# Patient Record
Sex: Male | Born: 1940 | Race: White | Hispanic: No | Marital: Married | Smoking: Never smoker
Health system: Southern US, Community
[De-identification: ages and names within clinical notes are randomized; demographics above are authoritative.]

## PROBLEM LIST (undated history)

## (undated) DIAGNOSIS — C61 Malignant neoplasm of prostate: Secondary | ICD-10-CM

## (undated) DIAGNOSIS — I809 Phlebitis and thrombophlebitis of unspecified site: Secondary | ICD-10-CM

## (undated) DIAGNOSIS — I82409 Acute embolism and thrombosis of unspecified deep veins of unspecified lower extremity: Secondary | ICD-10-CM

## (undated) DIAGNOSIS — N183 Chronic kidney disease, stage 3 unspecified: Secondary | ICD-10-CM

## (undated) DIAGNOSIS — Z9289 Personal history of other medical treatment: Secondary | ICD-10-CM

## (undated) DIAGNOSIS — R410 Disorientation, unspecified: Secondary | ICD-10-CM

## (undated) DIAGNOSIS — G4730 Sleep apnea, unspecified: Secondary | ICD-10-CM

## (undated) DIAGNOSIS — I10 Essential (primary) hypertension: Secondary | ICD-10-CM

## (undated) DIAGNOSIS — I499 Cardiac arrhythmia, unspecified: Secondary | ICD-10-CM

## (undated) DIAGNOSIS — E669 Obesity, unspecified: Secondary | ICD-10-CM

## (undated) DIAGNOSIS — E785 Hyperlipidemia, unspecified: Secondary | ICD-10-CM

## (undated) DIAGNOSIS — I2699 Other pulmonary embolism without acute cor pulmonale: Secondary | ICD-10-CM

## (undated) DIAGNOSIS — Z8719 Personal history of other diseases of the digestive system: Secondary | ICD-10-CM

## (undated) DIAGNOSIS — E119 Type 2 diabetes mellitus without complications: Secondary | ICD-10-CM

## (undated) HISTORY — DX: Obesity, unspecified: E66.9

## (undated) HISTORY — DX: Malignant neoplasm of prostate: C61

## (undated) HISTORY — DX: Personal history of other medical treatment: Z92.89

## (undated) HISTORY — DX: Phlebitis and thrombophlebitis of unspecified site: I80.9

## (undated) HISTORY — DX: Sleep apnea, unspecified: G47.30

## (undated) HISTORY — DX: Type 2 diabetes mellitus without complications: E11.9

## (undated) HISTORY — PX: OTHER SURGICAL HISTORY: SHX169

## (undated) HISTORY — DX: Other pulmonary embolism without acute cor pulmonale: I26.99

## (undated) HISTORY — DX: Acute embolism and thrombosis of unspecified deep veins of unspecified lower extremity: I82.409

---

## 1997-02-05 HISTORY — PX: NEPHRECTOMY: SHX65

## 1997-02-05 HISTORY — PX: CHOLECYSTECTOMY: SHX55

## 1997-10-06 ENCOUNTER — Inpatient Hospital Stay (HOSPITAL_COMMUNITY): Admission: RE | Admit: 1997-10-06 | Discharge: 1997-10-11 | Payer: Self-pay | Admitting: Urology

## 1997-11-08 ENCOUNTER — Ambulatory Visit (HOSPITAL_COMMUNITY): Admission: RE | Admit: 1997-11-08 | Discharge: 1997-11-08 | Payer: Self-pay | Admitting: Gastroenterology

## 1997-12-03 ENCOUNTER — Inpatient Hospital Stay (HOSPITAL_COMMUNITY): Admission: EM | Admit: 1997-12-03 | Discharge: 1997-12-07 | Payer: Self-pay | Admitting: *Deleted

## 1999-08-22 ENCOUNTER — Encounter: Admission: RE | Admit: 1999-08-22 | Discharge: 1999-08-22 | Payer: Self-pay | Admitting: Urology

## 1999-08-22 ENCOUNTER — Encounter: Payer: Self-pay | Admitting: Urology

## 1999-10-06 ENCOUNTER — Encounter (INDEPENDENT_AMBULATORY_CARE_PROVIDER_SITE_OTHER): Payer: Self-pay

## 1999-10-06 ENCOUNTER — Other Ambulatory Visit: Admission: RE | Admit: 1999-10-06 | Discharge: 1999-10-06 | Payer: Self-pay | Admitting: Urology

## 2000-04-10 ENCOUNTER — Encounter: Payer: Self-pay | Admitting: Urology

## 2000-04-10 ENCOUNTER — Encounter: Admission: RE | Admit: 2000-04-10 | Discharge: 2000-04-10 | Payer: Self-pay | Admitting: Urology

## 2000-08-26 ENCOUNTER — Encounter: Payer: Self-pay | Admitting: Urology

## 2000-08-26 ENCOUNTER — Encounter: Admission: RE | Admit: 2000-08-26 | Discharge: 2000-08-26 | Payer: Self-pay | Admitting: Urology

## 2001-09-17 ENCOUNTER — Inpatient Hospital Stay (HOSPITAL_COMMUNITY): Admission: AD | Admit: 2001-09-17 | Discharge: 2001-09-22 | Payer: Self-pay | Admitting: Family Medicine

## 2001-10-13 ENCOUNTER — Ambulatory Visit (HOSPITAL_COMMUNITY): Admission: RE | Admit: 2001-10-13 | Discharge: 2001-10-13 | Payer: Self-pay | Admitting: Surgery

## 2001-10-13 ENCOUNTER — Encounter: Payer: Self-pay | Admitting: Surgery

## 2001-10-31 ENCOUNTER — Inpatient Hospital Stay (HOSPITAL_COMMUNITY): Admission: AD | Admit: 2001-10-31 | Discharge: 2001-11-06 | Payer: Self-pay

## 2001-11-05 ENCOUNTER — Encounter: Payer: Self-pay | Admitting: Surgery

## 2001-11-24 ENCOUNTER — Encounter: Admission: RE | Admit: 2001-11-24 | Discharge: 2001-11-24 | Payer: Self-pay | Admitting: Urology

## 2001-11-24 ENCOUNTER — Encounter: Payer: Self-pay | Admitting: Urology

## 2002-11-12 ENCOUNTER — Encounter: Admission: RE | Admit: 2002-11-12 | Discharge: 2002-11-12 | Payer: Self-pay | Admitting: Psychiatry

## 2003-10-19 ENCOUNTER — Encounter: Admission: RE | Admit: 2003-10-19 | Discharge: 2004-01-17 | Payer: Self-pay | Admitting: Pediatrics

## 2003-11-22 ENCOUNTER — Encounter (INDEPENDENT_AMBULATORY_CARE_PROVIDER_SITE_OTHER): Payer: Self-pay | Admitting: Specialist

## 2003-11-22 ENCOUNTER — Ambulatory Visit (HOSPITAL_COMMUNITY): Admission: RE | Admit: 2003-11-22 | Discharge: 2003-11-22 | Payer: Self-pay | Admitting: Gastroenterology

## 2004-11-22 ENCOUNTER — Encounter: Admission: RE | Admit: 2004-11-22 | Discharge: 2005-02-04 | Payer: Self-pay

## 2006-02-05 DIAGNOSIS — I2699 Other pulmonary embolism without acute cor pulmonale: Secondary | ICD-10-CM

## 2006-02-05 HISTORY — DX: Other pulmonary embolism without acute cor pulmonale: I26.99

## 2006-10-10 ENCOUNTER — Inpatient Hospital Stay (HOSPITAL_COMMUNITY): Admission: EM | Admit: 2006-10-10 | Discharge: 2006-10-18 | Payer: Self-pay | Admitting: Emergency Medicine

## 2006-10-12 ENCOUNTER — Ambulatory Visit: Payer: Self-pay | Admitting: Vascular Surgery

## 2006-11-11 ENCOUNTER — Ambulatory Visit: Payer: Self-pay | Admitting: Critical Care Medicine

## 2006-11-11 LAB — CONVERTED CEMR LAB
INR: 1.7 — ABNORMAL HIGH (ref 0.8–1.0)
Prothrombin Time: 16.2 s — ABNORMAL HIGH (ref 10.9–13.3)

## 2007-01-20 ENCOUNTER — Inpatient Hospital Stay (HOSPITAL_COMMUNITY): Admission: AD | Admit: 2007-01-20 | Discharge: 2007-01-25 | Payer: Self-pay | Admitting: Cardiovascular Disease

## 2009-06-27 DIAGNOSIS — Z9289 Personal history of other medical treatment: Secondary | ICD-10-CM

## 2009-06-27 HISTORY — DX: Personal history of other medical treatment: Z92.89

## 2009-07-06 DIAGNOSIS — C61 Malignant neoplasm of prostate: Secondary | ICD-10-CM

## 2009-07-06 HISTORY — DX: Malignant neoplasm of prostate: C61

## 2009-07-16 ENCOUNTER — Inpatient Hospital Stay (HOSPITAL_COMMUNITY): Admission: EM | Admit: 2009-07-16 | Discharge: 2009-07-20 | Payer: Self-pay | Admitting: Emergency Medicine

## 2009-07-18 ENCOUNTER — Ambulatory Visit: Payer: Self-pay | Admitting: Internal Medicine

## 2009-07-18 ENCOUNTER — Ambulatory Visit: Payer: Self-pay | Admitting: Infectious Disease

## 2009-08-12 ENCOUNTER — Encounter (HOSPITAL_COMMUNITY): Admission: RE | Admit: 2009-08-12 | Discharge: 2009-08-12 | Payer: Self-pay | Admitting: Urology

## 2009-08-22 ENCOUNTER — Ambulatory Visit
Admission: RE | Admit: 2009-08-22 | Discharge: 2009-11-20 | Payer: Self-pay | Source: Home / Self Care | Admitting: Radiation Oncology

## 2009-11-21 ENCOUNTER — Ambulatory Visit
Admission: RE | Admit: 2009-11-21 | Discharge: 2010-01-11 | Payer: Self-pay | Source: Home / Self Care | Attending: Radiation Oncology | Admitting: Radiation Oncology

## 2010-04-23 LAB — BASIC METABOLIC PANEL
CO2: 28 mEq/L (ref 19–32)
Calcium: 8.5 mg/dL (ref 8.4–10.5)
GFR calc Af Amer: >60 mL/min (ref >60)
Sodium: 138 mEq/L (ref 135–145)

## 2010-04-23 LAB — GLUCOSE, CAPILLARY
Glucose-Capillary: 150 mg/dL — ABNORMAL HIGH (ref 70–99)
Glucose-Capillary: 85 mg/dL (ref 70–99)

## 2010-04-23 LAB — CBC
Hemoglobin: 13.1 g/dL (ref 13.0–17.0)
MCHC: 33.5 g/dL (ref 30.0–36.0)
RBC: 4.53 MIL/uL (ref 4.22–5.81)
WBC: 7.1 10*3/uL (ref 4.0–10.5)

## 2010-04-23 LAB — PROTIME-INR: INR: 1.47 (ref 0.00–1.49)

## 2010-04-24 LAB — CARDIAC PANEL(CRET KIN+CKTOT+MB+TROPI)
CK, MB: 1.8 ng/mL (ref 0.3–4.0)
CK, MB: 1.8 ng/mL (ref 0.3–4.0)
CK, MB: 1.8 ng/mL (ref 0.3–4.0)
Relative Index: 1.1 (ref 0.0–2.5)
Relative Index: 1.4 (ref 0.0–2.5)
Relative Index: INVALID (ref 0.0–2.5)
Total CK: 128 U/L (ref 7–232)
Total CK: 158 U/L (ref 7–232)
Total CK: 99 U/L (ref 7–232)
Troponin I: 0.04 ng/mL (ref 0.00–0.06)
Troponin I: 0.05 ng/mL (ref 0.00–0.06)
Troponin I: 0.06 ng/mL (ref 0.00–0.06)

## 2010-04-24 LAB — BASIC METABOLIC PANEL
BUN: 20 mg/dL (ref 6–23)
CO2: 30 mEq/L (ref 19–32)
Calcium: 8.6 mg/dL (ref 8.4–10.5)
Chloride: 104 mEq/L (ref 96–112)
Creatinine, Ser: 1.77 mg/dL — ABNORMAL HIGH (ref 0.4–1.5)
GFR calc Af Amer: 47 mL/min — ABNORMAL LOW (ref >60)
GFR calc non Af Amer: 39 mL/min — ABNORMAL LOW (ref >60)
Glucose, Bld: 92 mg/dL (ref 70–99)
Potassium: 4.6 mEq/L (ref 3.5–5.1)
Sodium: 138 mEq/L (ref 135–145)

## 2010-04-24 LAB — GLUCOSE, CAPILLARY
Glucose-Capillary: 100 mg/dL — ABNORMAL HIGH (ref 70–99)
Glucose-Capillary: 103 mg/dL — ABNORMAL HIGH (ref 70–99)
Glucose-Capillary: 109 mg/dL — ABNORMAL HIGH (ref 70–99)
Glucose-Capillary: 112 mg/dL — ABNORMAL HIGH (ref 70–99)
Glucose-Capillary: 124 mg/dL — ABNORMAL HIGH (ref 70–99)
Glucose-Capillary: 133 mg/dL — ABNORMAL HIGH (ref 70–99)
Glucose-Capillary: 143 mg/dL — ABNORMAL HIGH (ref 70–99)
Glucose-Capillary: 176 mg/dL — ABNORMAL HIGH (ref 70–99)
Glucose-Capillary: 64 mg/dL — ABNORMAL LOW (ref 70–99)
Glucose-Capillary: 90 mg/dL (ref 70–99)
Glucose-Capillary: 94 mg/dL (ref 70–99)

## 2010-04-24 LAB — COMPREHENSIVE METABOLIC PANEL
ALT: 25 U/L (ref 0–53)
AST: 24 U/L (ref 0–37)
Albumin: 3.3 g/dL — ABNORMAL LOW (ref 3.5–5.2)
Alkaline Phosphatase: 30 U/L — ABNORMAL LOW (ref 39–117)
BUN: 20 mg/dL (ref 6–23)
CO2: 29 mEq/L (ref 19–32)
Calcium: 8.1 mg/dL — ABNORMAL LOW (ref 8.4–10.5)
Chloride: 105 mEq/L (ref 96–112)
Creatinine, Ser: 1.75 mg/dL — ABNORMAL HIGH (ref 0.4–1.5)
GFR calc Af Amer: 47 mL/min — ABNORMAL LOW (ref >60)
GFR calc non Af Amer: 39 mL/min — ABNORMAL LOW (ref >60)
Glucose, Bld: 151 mg/dL — ABNORMAL HIGH (ref 70–99)
Potassium: 4 mEq/L (ref 3.5–5.1)
Sodium: 139 mEq/L (ref 135–145)
Total Bilirubin: 1.1 mg/dL (ref 0.3–1.2)
Total Protein: 6.1 g/dL (ref 6.0–8.3)

## 2010-04-24 LAB — CBC
HCT: 38.5 % — ABNORMAL LOW (ref 39.0–52.0)
HCT: 39.6 % (ref 39.0–52.0)
HCT: 39.7 % (ref 39.0–52.0)
Hemoglobin: 12.9 g/dL — ABNORMAL LOW (ref 13.0–17.0)
Hemoglobin: 13.3 g/dL (ref 13.0–17.0)
Hemoglobin: 13.4 g/dL (ref 13.0–17.0)
MCHC: 33.4 g/dL (ref 30.0–36.0)
MCHC: 33.6 g/dL (ref 30.0–36.0)
MCHC: 34.3 g/dL (ref 30.0–36.0)
MCV: 86.2 fL (ref 78.0–100.0)
MCV: 86.7 fL (ref 78.0–100.0)
MCV: 86.9 fL (ref 78.0–100.0)
MCV: 87.1 fL (ref 78.0–100.0)
Platelets: 138 10*3/uL — ABNORMAL LOW (ref 150–400)
Platelets: 143 10*3/uL — ABNORMAL LOW (ref 150–400)
Platelets: 164 10*3/uL (ref 150–400)
RBC: 4.42 MIL/uL (ref 4.22–5.81)
RBC: 4.58 MIL/uL (ref 4.22–5.81)
RBC: 4.59 MIL/uL (ref 4.22–5.81)
RBC: 5.72 MIL/uL (ref 4.22–5.81)
RDW: 14 % (ref 11.5–15.5)
RDW: 14.5 % (ref 11.5–15.5)
RDW: 14.7 % (ref 11.5–15.5)
WBC: 7.4 10*3/uL (ref 4.0–10.5)
WBC: 9.1 10*3/uL (ref 4.0–10.5)
WBC: 9.4 10*3/uL (ref 4.0–10.5)
WBC: 9.9 10*3/uL (ref 4.0–10.5)

## 2010-04-24 LAB — PROTIME-INR
INR: 1.32 (ref 0.00–1.49)
INR: 1.33 (ref 0.00–1.49)
INR: 1.34 (ref 0.00–1.49)
INR: 1.43 (ref 0.00–1.49)
Prothrombin Time: 16.3 seconds — ABNORMAL HIGH (ref 11.6–15.2)
Prothrombin Time: 16.4 seconds — ABNORMAL HIGH (ref 11.6–15.2)
Prothrombin Time: 16.5 seconds — ABNORMAL HIGH (ref 11.6–15.2)
Prothrombin Time: 17.3 seconds — ABNORMAL HIGH (ref 11.6–15.2)

## 2010-04-24 LAB — URINE CULTURE

## 2010-04-24 LAB — URINALYSIS, ROUTINE W REFLEX MICROSCOPIC
Glucose, UA: NEGATIVE mg/dL
Leukocytes, UA: NEGATIVE
Specific Gravity, Urine: 1.016 (ref 1.005–1.030)
pH: 7 (ref 5.0–8.0)

## 2010-04-24 LAB — DIFFERENTIAL
Lymphocytes Relative: 6 % — ABNORMAL LOW (ref 12–46)
Lymphs Abs: 0.5 10*3/uL — ABNORMAL LOW (ref 0.7–4.0)
Monocytes Relative: 0 % — ABNORMAL LOW (ref 3–12)
Neutro Abs: 8.5 10*3/uL — ABNORMAL HIGH (ref 1.7–7.7)
Neutrophils Relative %: 93 % — ABNORMAL HIGH (ref 43–77)

## 2010-04-24 LAB — POCT I-STAT, CHEM 8
Chloride: 109 mEq/L (ref 96–112)
Creatinine, Ser: 1.6 mg/dL — ABNORMAL HIGH (ref 0.4–1.5)
Glucose, Bld: 105 mg/dL — ABNORMAL HIGH (ref 70–99)
HCT: 51 % (ref 39.0–52.0)
Hemoglobin: 17.3 g/dL — ABNORMAL HIGH (ref 13.0–17.0)
Potassium: 4.2 mEq/L (ref 3.5–5.1)
Sodium: 137 mEq/L (ref 135–145)

## 2010-04-24 LAB — CULTURE, BLOOD (ROUTINE X 2): Culture: NO GROWTH

## 2010-04-24 LAB — HIV ANTIBODY (ROUTINE TESTING W REFLEX): HIV: NONREACTIVE

## 2010-04-24 LAB — URINE MICROSCOPIC-ADD ON

## 2010-04-24 LAB — MRSA PCR SCREENING: MRSA by PCR: NEGATIVE

## 2010-04-24 LAB — LACTIC ACID, PLASMA: Lactic Acid, Venous: 1.3 mmol/L (ref 0.5–2.2)

## 2010-04-24 LAB — PROCALCITONIN: Procalcitonin: 4.91 ng/mL

## 2010-06-20 NOTE — Discharge Summary (Signed)
[name], [name]                ACCOUNT NO.:  [id number]   MEDICAL RECORD NO.:  [id number]          PATIENT TYPE:  INP   LOCATION:  6731                         FACILITY:  [facility]   PHYSICIAN:  Kela Millin, M.D.DATE OF BIRTH:  1940/06/04   DATE OF ADMISSION:  10/10/2006  DATE OF DISCHARGE:  10/18/2006                               DISCHARGE SUMMARY   DISCHARGE DIAGNOSES:  1. Bilateral pulmonary embolism, massive.  2. Right lower extremity deep venous thrombosis.  3. Recent right ankle fracture.  4. Venous stasis ulcers.  5. Diabetes mellitus.  6. Hyperlipidemia.  7. Hypertension.  8. Osteoporosis.  9. Morbid obesity.   PROCEDURES AND STUDIES:  1. CT angiogram of chest - massive pulmonary embolism, bilateral.  2. Lower extremity Doppler ultrasound - positive for deep venous      thrombosis in the right femoral, popliteal and posterior tibial      veins.   BRIEF HISTORY:  The patient is a 70 year old white male with the above  listed medical problems who presented with complaints of shortness of  breath. It was noted that he had recently sustained an ankle fracture  and was placed in a boot with plans for eventual surgery.  He reported  that he was on bed rest and on September 2 when he followed up with his  orthopedic surgeon and had been prepped for surgery he was short of  breath with O2 saturations of about 89%.  He also was noted to have a  fever, so the surgery was postponed.  The patient followed up with his  primary care physician for his diabetes and he became quite short of  breath and so was sent to the ER for further evaluation and management.  In the ER the patient had a D-dimer of 11.5 and his troponin was 0.15.  He had a CT angiogram of his chest done with results as stated above.  He was admitted for further evaluation and management.  The patient denied headache, visual changes, dysphagia, chest pain,  palpitations, wheezing, coughing, abdominal pain,  hematuria and no  dysuria.   Please see the full admission history and physical dictated on October 10, 2006 per Dr. Rito Ehrlich for the admission physical exam and also the  laboratory data.   HOSPITAL COURSE:  PROBLEM #1:  Bilateral pulmonary emboli - massive with  large saddle emboli.  Upon admission the patient was started on  anticoagulation with Lovenox and Coumadin.  His PT/INRs were monitored  until the INR became therapeutic and then the Lovenox and Coumadin were  overlapped and on recheck the INR is still therapeutic today at 3.  The  Lovenox was then discontinued and the patient will be discharged home on  Coumadin.  He has not had any evidence of bleeding.  The patient is to  have his PT/INR checked on October 20, 2006 at the [facility] lab and  the results have to be called to Dr. Rosey Bath for the Coumadin dose to  be adjusted as appropriate.  He is being discharged home on the  recommended Coumadin dose  per pharmacy.  While in the hospital, Dr.  Lynelle Doctor group with the [facility] study saw the patient and he agreed to  participate in this study and was randomized to the Lovenox/Coumadin arm  so he has been on this in the hospital.  He is to follow up also with  Dr. Delford Field as scheduled.  The patient is to see his primary care  physician next week and again the PT/INR will be checked and the results  called to Dr. Dellie Catholic office for the Coumadin dose to be adjusted  as appropriate.  The patient had been immobilized following his ankle  fracture and this is thought to have been the predisposing factor to the  pulmonary embolus.  He had an ultrasound of his lower extremity done  which revealed a DVT as noted above and again, the patient has been  anticoagulated and is to continue Coumadin upon discharge.   PROBLEM #2:  Right leg deep venous thrombosis - as discussed above,  patient to continued Coumadin upon discharge and follow up with his  primary care physician for  monitoring of the PT/INR.   PROBLEM #3:  Diabetes mellitus - the patient's Accu-Checks were  monitored in the hospital and he was maintained on his outpatient  insulin, also sliding scale insulin.   PROBLEM #4:  Hypertension - patient was maintained on his outpatient  medications for blood pressure control.   PROBLEM #5:  Hyperlipidemia - patient was maintained on Zocor during his  hospital stay.   PROBLEM #6:  Recent right ankle fracture - Dr. Arthor Captain called Dr.  Rendall/orthopedic surgeon, regarding the patient's admission and he  indicated that the patient should follow up with him as an outpatient.  [name] is scheduled to follow up with him next Tuesday.  Further  activity/physical therapy is to be directed per Dr. Priscille Kluver.   DISCHARGE MEDICATIONS:  1. Aspirin changed to 81 mg daily.  2. Coumadin 5 mg p.o. q.p.m. - dose to be adjusted following PT/INR      checks as above.  3. Patient to continue preadmission medications - hydrocodone,      calcium/Caltrate, Centavite, glucosamine, chondroitin, Actonel,      joint formula, Zocor, Norvasc, Tri-Cor, quinapril,      hydrochlorothiazide and 70/30 NovoLog.   FOLLOWUP CARE:  1. Dr. Rosey Bath next week, patient to call for appointment.  2. Patient to have PT/INR drawn at the [facility] lab on October 20, 2006 and the results to be called to Dr. Maia Plan office.  3. Followup with Dr. Delford Field with Jaquelyn Bitter study as scheduled.  4. Followup with Dr. Priscille Kluver, orthopedic surgeon as scheduled.   DISCHARGE CONDITION:  Improved/stable.      Kela Millin, M.D.  Electronically Signed     ACV/MEDQ  D:  10/18/2006  T:  10/18/2006  Job:  [id number]   cc:   Carma Leaven, DO  John L. Rendall, M.D.  Charlcie Cradle Delford Field, MD, FCCP

## 2010-06-20 NOTE — H&P (Signed)
[name], [name] NO.:  [id number]   MEDICAL RECORD NO.:  [id number]          PATIENT TYPE:  INP   LOCATION:  1829                         FACILITY:  [facility]   PHYSICIAN:  Hollice Espy, M.D.DATE OF BIRTH:  1940-03-11   DATE OF ADMISSION:  10/10/2006  DATE OF DISCHARGE:                              HISTORY & PHYSICAL   PRIMARY CARE PHYSICIAN:  Carma Leaven, DO of [facility].   CHIEF COMPLAINT:  Shortness of breath.   HISTORY OF PRESENT ILLNESS:  The patient is a 70 year old white male  past medical history of diabetes, hypertension, obesity, and a fractured  leg on September 28, 2006.  He was placed in a boot with plans for eventual  surgery.  He initially was on bedrest and then this past Tuesday,  September 2nd, he followed with his orthopedic doctor after resuming  activity which left him quite winded.  He was noted in the orthopedic  doctor's office to be hypoxic with an O2 sat of 89%.  He is also having  a fever as well.  The patient was referred to his PCP, Dr. Vedia Coffer, who  saw the patient today.  Initially after seeing him in the office, the  patient became quite short of breath and the patient was sent over to  the emergency room for further evaluation.   In the emergency room, his D-dimer was noted to be quite elevated at  11.5.  His creatinine is elevated at 1.5.  The rest of his labs are  noted for an elevated troponin of 0.15 as well.  The patient underwent a  CT angio of the chest which noted a large saddle pulmonary embolus and  multiple smaller pulmonary emboli.  With these findings, it was felt the  patient needed to come in for further evaluation.   Currently while on oxygen he is feeling much better.  He denies any  headaches, visual changes, dysphagia.  With him on oxygen and not  moving, he denies any shortness of breath, any chest pain, palpitations,  no wheezing, coughing, abdominal pain, no hematuria, dysuria,  constipation, diarrhea.  No focal extremity numbness, weakness, or pain  other than his right lower extremity which is in a boot.   REVIEW OF SYSTEMS:  Otherwise negative.   PAST MEDICAL HISTORY:  1. Obesity.  2. Diabetes.  3. Hypertension.  4. Venous stasis ulcers.  5. Osteoporosis.  6. Hyperlipidemia.  7. And a recent right ankle fracture.   MEDICATIONS:  He is on:  1. Os-Cal 600 daily.  2. Multivitamin daily.  3. Aspirin 325 daily.  4. Glucosamine/chondroitin 500 daily.  5. Actonel 35 p.o. every week.  6. Zocor 20 p.o. daily.  7. Norvasc 10 p.o. daily.  8. TriCor 145 p.o. daily.  9. Quinapril 40 p.o. daily.  10.HCTZ 25 p.o. daily.  11.NovoLog 70/30, 85 mL subcutaneous b.i.d.   He has no known drug allergies.   SOCIAL HISTORY:  He denies any tobacco, alcohol, or drug use.   FAMILY HISTORY:  Noncontributory.   PHYSICAL EXAMINATION:  VITAL SIGNS:  On  admission, temp 97.5, heart rate  83, blood pressure 118/80, respirations 18, O2 sat 95% on room air.  GENERAL:  He is alert and oriented x3.  No apparent distress.  HEENT:  Normocephalic atraumatic.  His mucous membranes are moist.  He  has no carotid bruits.  HEART:  Regular rate and rhythm.  S1 S2.  LUNGS:  Decreased breath sounds throughout secondary to body habitus.  ABDOMEN:  Soft, obese, nontender.  Positive bowel sounds.  EXTREMITIES:  Show no clubbing or cyanosis.  He has got bilateral venous  stasis ulcers.  His right foot is in a boot.   LABORATORY WORK:  White count 9.3, H&H 14.3 and 42, MCV of 85, platelet  count 231, no shift.  D-dimer elevated at 11.5.  Sodium 138, potassium  4.1, chloride 103, bicarb 28, BUN 25, creatinine 1.5, glucose 155.  CPK  287, MB 3, troponin I 0.15.  Second set is similar.  Coags PT 15.1, INR  1.2, PTT 43.   ASSESSMENT/PLAN:  1. Large pulmonary embolus secondary to inactivity.  We will put the      patient on Lovenox and Coumadin protocol plus supplemental oxygen.      I have  discussed Coumadin teaching with the patient.  He will      receive Coumadin education.  2. Foot fracture.  We will notify his orthopedic doctor, Dr. Priscille Kluver,      that the patient is here in terms of them to be able to make plans      now that the patient has been found to have a pulmonary embolus and      will need to be on Coumadin.  3. Morbid obesity.  4. Diabetes mellitus, continue insulin sliding scale.  5. Hypertension.  Continue medications.      Hollice Espy, M.D.  Electronically Signed     SKK/MEDQ  D:  10/10/2006  T:  10/11/2006  Job:  [id number]   cc:   Carma Leaven, DO  John L. Rendall, M.D.

## 2010-06-20 NOTE — Discharge Summary (Signed)
[name], [name]                ACCOUNT NO.:  [id number]   MEDICAL RECORD NO.:  [id number]          PATIENT TYPE:  INP   LOCATION:  4732                         FACILITY:  [facility]   PHYSICIAN:  Richard A. Alanda Amass, M.D.DATE OF BIRTH:  1940/03/10   DATE OF ADMISSION:  01/20/2007  DATE OF DISCHARGE:  01/25/2007                               DISCHARGE SUMMARY   [name] is a 71 year old white male patient who was seen in our office  after referral from his primary care doctor, Dr. Rosey Bath, secondary  to positive lower extremity DVT.  He was diagnosed apparently with a DVT  and massive bilateral pulmonary emboli that included a large saddle  emboli on October 10, 2006.  He had been treated with Coumadin since  that time.  Apparently his INRs had not been therapeutic and it appeared  that he had another subacute DVT; thus, he was brought in the hospital  for a heparin-to-Coumadin crossover.  His on heparin to Coumadin was  managed by the pharmacy but during his hospitalization it was noted that  he had sleep apnea with no previous history of a sleep study.  He did  have a wide-complex tachycardia only at night when he slept with also an  episode of 2:1 heart block also when he slept.  We titrated his  medications for his blood pressure, his arrhythmias, etc.  He had a 48-  hour crossover with his INR greater than 2.5 for 2 days in a row prior  to his heparin being discontinued.  On January 25, 2007, his INR was  3.2 and he was considered stable to discharge home.   LABS:  There are no radiology tests showing up during this admission.  His CBC on the day of discharge, hemoglobin was 11.7,  hematocrit 34.8,  his WBC is 4.6, his platelets 297.  INR was 3.2.  magnesium was 2.3.  His sodium was 137, potassium 3.9, BUN 18, creatinine 1.39, chloride  102, CO2 27.  INR on December 19 was 2.7.  on December 18 it was 2.3.  December 17, it was 2.0.  Hemoglobin A1c was 7.6.  Lipid profile  showed  total cholesterol of 110 and triglycerides of 369, HDL of 21 and LDL of  15.  TSH was 4.006.   DISCHARGE MEDICATIONS:  1. Caltrate Calcium 600 mg daily.  2. Central vitamin daily.  3. Aspirin 81 mg a day.  4. Glucosamine/chondroitin daily.  5. Actonel 35 mg weekly.  6. Tricor 145 mg a day.  7. Quinapril 40 mg once a day.  8. NovoLog mix 70/30 90 units twice a day.  9. Warfarin 12.5 mg a day.  10.Niaspan 500 mg at bedtime.  11.Omega-3 fish oil capsules three per day.  12.He should decrease his simvastatin to 10 mg at bedtime.  13.Lasix 40 mg a day.  14.Metoprolol 25 mg twice a day. at.   DISCHARGE DIAGNOSES:  1. Subacute deep vein thrombosis and on therapeutic Coumadin.  2. History of massive bilateral pulmonary emboli with a large saddle      embolus September 2008.  3. History of right foot fracture with problems with healing.  4. Morbid obesity.  5. Probable sleep apnea.  6. Arrhythmias, wide-complex tachycardia/nonsustained ventricular      tachycardia, supraventricular tachycardia and 1:1 block, all at the      time of sleeping.  7. Hypertension.  8. Dyslipidemia with very low HDL and high triglycerides,medicine      suggested.  9. Insulin-dependent diabetes mellitus.  10.Chronic lymphedema on the left leg, wearing Ace wrap and apparently      as an outpatient he is having massages done to decrease the      swelling.  11.Anticoagulation.  He is in a study with Dr. Shan Levans for      Coumadin versus another anticoagulant.  Because of his subacute      deep vein thrombosis and the massive pulmonary emboli, we want his      INR to be greater than 2.5, and he will be followed at our Coumadin      clinic for his pro times per Dr. Luisa Hart Wright's request.   He will be scheduled as an outpatient to check his INR on either Monday  or Tuesday.  He will also need a 2-D echocardiogram and a sleep study.  He states he has had a nuclear and Myoview stress test at  another  physician's office recently.  We were unable to find that ourselves.  The patient said he will get the results of this study and bring it to  her office.      Lezlie Octave, N.P.      Richard A. Alanda Amass, M.D.  Electronically Signed    BB/MEDQ  D:  01/25/2007  T:  01/27/2007  Job:  [id number]   cc:   Carma Leaven, DO  Charlcie Cradle Delford Field, MD, FCCP

## 2010-06-23 NOTE — H&P (Signed)
NAME:  [name], [name]                          ACCOUNT NO.:  [id number]   MEDICAL RECORD NO.:  [id number]                   PATIENT TYPE:  INP   LOCATION:  5501                                 FACILITY:  [facility]   PHYSICIAN:  Skeet Simmer., M.D.         DATE OF BIRTH:  17-Feb-1940   DATE OF ADMISSION:  10/31/2001  DATE OF DISCHARGE:                                HISTORY & PHYSICAL   CHIEF COMPLAINT:  Infection left leg.   HISTORY OF PRESENT ILLNESS:  The patient is a 70 year old white male who  recently underwent I&D of the left lower extremity for an infected hematoma  and cellulitis.  Dr. Gerrit Friends performed the procedure.  Dressing changes have  been used.  Yesterday, the patient developed fever to 103.  He felt somewhat  bad.  He was started on oral antibiotics at [facility] but today  notes marked increased redness of the left leg. There is no increase in  pain.  Temperature at the office in 100.8.  The patient is admitted to the  hospital for treatment of cellulitis.  There is a remote history of  cellulitis and chronic history of swelling of the left lower extremity.  He  has no definite history of DVT but that extremity is always larger than the  right.   PAST MEDICAL HISTORY:  The patient is a type 2 diabetic.  He is on Amaryl.  He has high blood pressure and is on Accupril and hydrochlorothiazide.  He  has had radical nephrectomy for carcinoma in 1999.  He had a cholecystectomy  in 1999.  He has no history of any heart trouble.  Childhood illnesses are  unremarkable.   ALLERGIES:  No known drug allergies including penicillin.   SOCIAL HISTORY:  He does not smoke.  He occasionally drinks alcohol  beverages moderately.   FAMILY HISTORY:  Unremarkable.   PHYSICAL EXAMINATION:  GENERAL APPEARANCE:  The patient is obese.  Mental  status is normal.  VITAL SIGNS:  Not taken in the office.  HEENT:  Unremarkable.  NECK:  Unremarkable.  CHEST:  Clear to  auscultation.  CARDIOVASCULAR:  Rate and rhythm normal, no murmur or gallop.  ABDOMEN:  No mass, tenderness or organomegaly.  GENITALIA:  Normal.  RECTAL:  Not performed.  EXTREMITIES:  Pulses are present. There is marked redness and swelling of  the left lower extremity below the knee.  There is mild to moderate  tenderness.  There is increased warmth of the left lower extremity.  There  is no evidence of infection on the right side.  NEUROLOGIC:  Normal.  SKIN:  There is an open wound without very much of a cavity, mostly  granulated, the anterolateral left leg   IMPRESSION:  1. Cellulitis, recurrent, left leg.  2. Diabetes mellitus, type 2.  3.     Obesity.  4. Hypertension.   PLAN:  Admission for bed rest,  IV Zosyn and careful observation and dressing  changes.                                                Skeet Simmer., M.D.    Elvis Coil  D:  10/31/2001  T:  11/03/2001  Job:  [id number]

## 2010-06-23 NOTE — Op Note (Signed)
NAMEJAMEIRE, [name]                ACCOUNT NO.:  [id number]   MEDICAL RECORD NO.:  [id number]          PATIENT TYPE:  AMB   LOCATION:  ENDO                         FACILITY[facility]   PHYSICIAN:  Bernette Redbird, M.D.   DATE OF BIRTH:  1940/11/15   DATE OF PROCEDURE:  11/22/2003  DATE OF DISCHARGE:                                 OPERATIVE REPORT   PROCEDURE:  Colonoscopy with polypectomy and biopsies.   INDICATION:  This is a very pleasant 70 year old gentleman, who, in the  past, has had irregular mucosa of the rectum and a somewhat polypoid  configuration, but with biopsies never showing any neoplastic change.  He  presents now for updated screening.   FINDINGS:  Medium size polyp removed from the proximal colon.  Multiple  sessile polypoid lesions of the rectum of uncertain clinical significance.   DESCRIPTION OF PROCEDURE:  The nature, purpose, and risks of the procedure  were familiar to the patient who provided written consent.  Sedation was  fentanyl 50 mcg and Versed 5 mg IV without arrhythmias or desaturation.  Digital exam of the prostate was unremarkable.   The Olympus adult adjustable tension video colonoscope was quite easily  advanced to the cecum, using some external abdominal compression to control  looping.  The cecum was identified by clear visualization of the appendiceal  orifice.   Pull-back was then performed.   On the way in, I encountered a semi-pedunculated 4 x 9 mm polyp.  This was a  short distance above the cecum.  It was injected with 1.5 mL of 1:10,000  epinephrine with good regional blanching and then transected using the ERBE  coagulation device.  It did not initially come through the scope.  We  flushed it out, irrigated more water, and then it did come through the scope  with some resistance to be retrieved for histologic analysis.  The  polypectomy site had a good eschar without evidence of excessive cautery and  with complete hemostasis.   In the left colon, at about 60 cm, there was a small sessile polyp biopsied.  It was about 3 mm across.  In the sigmoid region, there was a 2 mm sessile  polyp, biopsied and then, in the rectum, there were innumerable (perhaps 100  or more) small sessile nodules, many of which were biopsied.  In some areas,  these coalesced to make more or less a carpet of irregular mucosa as had  been previously noted.  Multiple areas were biopsied.  Retroflexion was not  performed in the rectum, but reinspection did not disclose additional  findings.   No large polyps or masses were seen on this exam and specifically there was  no endoscopic evidence of colon cancer.  There was some left-sided  diverticulosis.   The patient who tolerated the procedure well, and there were no apparent  complications.   IMPRESSION:  Multiple colon and rectal polyps encountered as described above  (see above description).  (211.3, 211.4).   PLAN:  Await pathology on the polyps.      RB/MEDQ  D:  11/22/2003  T:  11/22/2003  Job:  [id number]   cc:   Dellis Anes. Idell Pickles, M.D.  [address]  [place]  [place] [address]  Fax: [phone]

## 2010-06-23 NOTE — Discharge Summary (Signed)
NAME:  [name], [name]                          ACCOUNT NO.:  [id number]   MEDICAL RECORD NO.:  [id number]                   PATIENT TYPE:  INP   LOCATION:  5020                                 FACILITY:  [facility]   PHYSICIAN:  Deirdre Peer. Polite, M.D.              DATE OF BIRTH:  02-Jan-1941   DATE OF ADMISSION:  09/17/2001  DATE OF DISCHARGE:  09/22/2001                                 DISCHARGE SUMMARY   DISCHARGE DIAGNOSES:  1. Left lower extremity cellulitis with infected hematoma.  2. Uncontrolled non-insulin-dependent diabetes mellitus.  3. Hypertension.  4. Obesity.   DISCHARGE MEDICATIONS:  1. Augmentin 875 mg 1 every 12 hours for 12 days.  2. Amaryl 4 mg daily.  3. Avandia 8 mg daily.  4. Prinivil 40 mg daily.  5. Hydrochlorothiazide 25 mg daily.  6. Lasix 40 mg daily.  7. Enteric-coated aspirin 81 mg daily.  8. Tri-Chlor 160 mg daily with food.  9. Vicodin 5/500 one to two tablets every 6 hours p.r.n. pain.   CONSULTATIONS:  Velora Heckler, M.D., general surgery.   PROCEDURES:  1. EKG on 09/18/2001 revealing normal sinus rhythm.  2. I&D of left shin hematoma by Dr. Gerrit Friends on 09/18/2001.   LABORATORY DATA:  On 09/22/2001, sodium 132, chloride 93, potassium 3.8,  glucose 292, BUN 37, creatinine 2.1.  Admission BUN was 20 and creatinine  1.5.  ESR 26.  Cholesterol 135, triglycerides 170, HDL 33, LDL 58.  Blood  cultures revealed no growth.  Wound cultures revealed no growth.   DISPOSITION:  The patient will be discharged home with wound care followup.   HISTORY OF PRESENT ILLNESS:  This is a 70 year old male with non-insulin-  dependent diabetes mellitus who fell at home five days prior to his  admission and suffered an abrasion to his left lower extremity.  He was seen  at [facility] walk-in and started on Augmentin and Lasix for the cellulitis and  significant edema.  He had a followup appointment on 09/17/2001 with his  primary care physician at which time there was noted  to be no improvement in  his symptoms with drainage from his left lower extremity.  The patient  reported a two to three day history of fever and chills.  The patient was  sent to [facility] for admission for treatment of his left lower extremity  cellulitis.   HOSPITAL COURSE:  1. LEFT LOWER EXTREMITY CELLULITIS:  The patient was started on  broad-     spectrum antibiotics.  Cultures were obtained.  He was kept     nonweightbearing.  On 09/18/2001, he was noted to have a fluctuant area to     his left shin.  A surgery consult was obtained, and Dr. Gerrit Friends performed     an I&D of his left shin with impression of it being a probable infected     hematoma.  He was  seen in followup by Dr. Gerrit Friends who noted improvement.     IV antibiotics were changed to p.o. on 09/20/2001.  On the day of     discharge, he was again seen by Dr. Gerrit Friends who again noted improvement     and recommended followup in the office in three days with change of     dressing.   1. UNCONTROLLED NON-INSULIN-DEPENDENT DIABETES MELLITUS:  The patient was     noted to be noncompliant with diet and exercise.  His blood sugars were     followed closely.  Hemoglobin A1C was done and was noted to be elevated     at 10.4.  Avandia was added to his current regimen of Amaryl with some     improvement in his CBGs.  The patient has been instructed on the     importance of a good diet and exercise.  The patient should follow up     with his primary care physician on an outpatient basis.   1. HYPERTENSION:  Controlled during hospitalization. Will continue his same     medications.   1. WORSENING RENAL FUNCTION:  The patient was previously only on     hydrochlorothiazide.  He was put on Lasix when he initially presented at     [facility] walk-in for his cellulitis and skin edema.  His worsening renal     function was most likely secondary to over diuresis.  At this time will     discontinue Lasix and discharge the patient home again on his  previous     medication of HCTZ 25 mg daily.  A voice mail will be left for Dr. Idell Pickles     to follow up with the patient's labs.   FOLLOW UP:  1. The patient is to follow up with Dr. Darnell Level in three days.  2. He is to follow up with Dr. Idell Pickles with BMP in one week.     Stephanie G. [place], N.P.                 Deirdre Peer. Polite, M.D.    SGJ/MEDQ  D:  09/22/2001  T:  09/24/2001  Job:  [id number]   cc:   Velora Heckler, M.D.  Fax: [phone]   Raynelle Dick, M.D.

## 2010-06-23 NOTE — Discharge Summary (Signed)
NAME:  [name], [name]                          ACCOUNT NO.:  [id number]   MEDICAL RECORD NO.:  [id number]                   PATIENT TYPE:  INP   LOCATION:  5525                                 FACILITY:  [facility]   PHYSICIAN:  Velora Heckler, M.D.                DATE OF BIRTH:  Jun 13, 1940   DATE OF ADMISSION:  10/31/2001  DATE OF DISCHARGE:  11/06/2001                                 DISCHARGE SUMMARY   REASON FOR ADMISSION:  Cellulitis, left lower extremity.   HISTORY OF PRESENT ILLNESS:  The patient is a 70 year old white male who  sustained blunt injury to the left lower extremity during summer of 2003. He  developed a significant hematoma with cellulitis. He underwent incision and  drainage. The patient developed progressive cellulitis and required wound  exploration and evacuation of hematoma. The wound has been healing by  secondary intention. The patient developed significant cellulitis and  presented to the office with fever and leukocytosis. He was admitted by Dr.  Lebron Conners to [facility] for intravenous antibiotic therapy and  wound care.   HOSPITAL COURSE:  The patient was admitted on October 31, 2001. He was  started on intravenous Zosyn. He continued to have local wound care with  rest and elevation of the left lower extremity. The patient had improvement  with rapid resolution of fever  and cellulitis. The patient underwent repeat  magnetic resonance imaging scan of the left lower extremity which was  suspicious for possible Brodie's abscess versus osteomyelitis.   Dr. Valma Cava from orthopedic service saw the patient in consultation.  Plain films were obtained. A CT scan of the  ankle and lower tibia were  obtained. Dr. Thomasena Edis felt that osteomyelitis was unlikely and recommended a  prolonged course of oral Tequin as an outpatient.  The patient is prepared  for discharge home today, November 06, 2001.   DISCHARGE PLAN:  The patient will be  discharged home today, November 06, 2001,  in good condition, tolerating a regular diet and ambulating independently.  He will continue to have twice daily dressing changes to the left lower  extremity. Antibiotics will include Tequin 400 mg q.d. x 3 weeks.   FOLLOW UP:  The patient will be seen  back  in my office at [facility] in one week for a wound check.    FINAL DIAGNOSIS:  Chronic wound, left lower extremity, cellulitis left lower  extremity, rule out osteomyelitis.   CONDITION ON DISCHARGE:  Improved.                                               Velora Heckler, M.D.    TMG/MEDQ  D:  11/06/2001  T:  11/10/2001  Job:  [id number]  cc:   Erasmo Leventhal, MD  [address]  [place]  [place] [address]  Fax: [phone]   Deirdre Peer. Polite, M.D.  [address]  [place], [place] [address]  Fax: [phone]   [facility]

## 2010-06-23 NOTE — H&P (Signed)
NAME:  [name], [name]                          ACCOUNT NO.:  [id number]   MEDICAL RECORD NO.:  [id number]                   PATIENT TYPE:  INP   LOCATION:  5020                                 FACILITY:  [facility]   PHYSICIAN:  Raynelle Jan, MD                  DATE OF BIRTH:  [birth year]   DATE OF ADMISSION:  09/17/2001  DATE OF DISCHARGE:                                HISTORY & PHYSICAL   PRIMARY CARE PHYSICIAN:  Dellis Anes. Heller, M.D.   CHIEF COMPLAINT:  Left lower extremity drainage and redness.   HISTORY OF PRESENT ILLNESS:  This is a 70 year old male with a past medical  history of diabetes and hypertension, who fell five days prior to admission,  suffering abrasion of the left lower extremity.  He noticed some immediate  drainage and pain in the extremity and was seen over the weekend at [facility] for evaluation.  At that time, he was started on Augmentin XR b.i.d.  and Lasix for cellulitis and significant edema.  Today he presented for  follow-up at his primary M.D.'s office where he was noted to have no real  significant improvement of his symptoms or drainage from the extremity.  He  notes fevers and chills over the last two to three days, some increased  erythema and swelling over the last three days, and of course the yellowish  drainage at the initial injury site.   REVIEW OF SYMPTOMS:  Significant for a 20-30-pound weight loss over the last  three months due to diet and exercise.  He notes some polyuria and  polydipsia over the last three to four days due to his illness.  His review  of systems is otherwise negative, except as noted in the HPI.   PAST MEDICAL HISTORY:  Significant for:  1. Diabetes mellitus type 2 x 2 years, noninsulin requiring.  2. Hypertension.  3. Hypercholesterolemia.  4. Hypertriglyceridemia.  5. Morbid obesity.   PAST SURGICAL HISTORY:  1. 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  2. Status post  cholecystectomy in 1999.  3. Status post shoulder arthroscopy approximately 40 years ago.   MEDICATIONS:  1. Accupril 40 mg p.o. q.d.  2. HCTZ 25 mg p.o. q.d.  3. Amaryl 4 mg p.o. q.d.  4. Fenofibrate 200 mg p.o. q.a.m.  5. Enteric-coated aspirin 81 mg p.o. q.d.  6. Lasix 20 mg p.o. b.i.d. started on September 14, 2001.  7. Augmentin XR p.o. b.i.d. started on September 14, 2001.   ALLERGIES:  No known drug allergies.   SOCIAL HISTORY:  He is married with two children at home.  History of  tobacco abuse, however, he quit 20 years ago.  He does drink alcohol on a  social basis.   FAMILY HISTORY:  Both his mother and his father have diabetes and lung  cancer.   PHYSICAL  EXAMINATION:  VITAL SIGNS:  Temperature 99.1 degrees, BP 123/80,  pulse 71 and regular, respirations 20, saturation 97% on room air.  GENERAL APPEARANCE:  This is an obese male, alert and oriented, and in no  acute distress.  HEENT:  Mucous membranes are pink and moist. The EOMs are intact.  No  scleral icterus is noted.  NECK:  Supple without JVD, lymphadenopathy, or bruits.  CARDIOVASCULAR:  Normal S1 and S2.  Regular rate and rhythm with a soft  grade 2/6 systolic murmur heard in the aortic region.  LUNGS:  Clear.  ABDOMEN:  Soft, obese, nontender, and nondistended.  Positive bowel sounds.  No hepatosplenomegaly.  No masses.  He has a well-healed left flank scar.  EXTREMITIES:  The left lower extremity has 3+ pitting edema, warmth, and  erythema.  There is a small fluctuant eschar noted on the anterior surface  of the lower tibia.  He has trace edema of the right lower extremity.   LABORATORY DATA:  Labs on admission included a white count of 6.9,  hemoglobin 14, and platelets 241.  His BMET is significant for a creatinine  of 1.5 and a glucose of 243, otherwise normal.  Blood cultures are pending.   His x-ray from [place] showed no acute bony process and some soft tissue  swelling to my interpretation.    IMPRESSION:  This is a 70 year old male with diabetes, admitted with  cellulitis to the left lower extremity.   ASSESSMENT AND PLAN:  1. Cellulitis.  Will admit for broad-spectrum IV antibiotic therapy.  Will     closely follow cultures.  Will check a sedimentation rate and consider     repeat imaging if he fails to improve within 24-48 hours of therapy.     Specifically, given the mechanism of injury and comorbid conditions,     would consider MRI to look for osteomyelitis.  He will be given pain     control.  Will keep him nonweightbearing.  2. Diabetes.  Will check a hemoglobin A1c and control his blood sugars with     sliding scale insulin.  3. Dyslipidemia.  Will check a fasting lipid profile in the a.m.  Will also     check LFTs due to his fenofibrate use.  4. Pedal edema and heart murmur.  Will check and EKG given his cardiac risk     factors.  Will continue him on some low-dose Lasix.  Will defer any     further work-up at this point to his primary M.D. as an outpatient.  5. Hypertension.  Will continue him on his home medications.                                               Raynelle Jan, MD    HMS/MEDQ  D:  09/17/2001  T:  09/20/2001  Job:  [id number]   cc:   Raynelle Dick, M.D.

## 2010-06-23 NOTE — Consult Note (Signed)
NAME:  [name], [name]                          ACCOUNT NO.:  [id number]   MEDICAL RECORD NO.:  [id number]                   PATIENT TYPE:  INP   LOCATION:  5525                                 FACILITY:  [facility]   PHYSICIAN:  Erasmo Leventhal, MD           DATE OF BIRTH:  [birth year]   DATE OF CONSULTATION:  DATE OF DISCHARGE:  11/06/2001                                   CONSULTATION   HISTORY OF PRESENT ILLNESS:  The patient is a very pleasant, 70 year old  male currently managed conservatively with Dr. Ileene Rubens for recurrent  cellulitis.  The history reveals that on 09/11/2001, he stumbled and simply  scraped his left lower extremity.  He thought it would heal uneventfully and  treated it himself.  It eventually developed into an infection.  He went to  [facility] and could not recall who he saw and they put him on  diuretics and antibiotics.  On 09/17/2001, he returned to see Dr. Foye Deer.  He had increasing lower extremity symptoms and he was admitted.  I  believe he my have been admitted to the Teaching Service and he said the  resident saw him and admitted him.  Following that, he was on IV antibiotics  for cellulitis.  Dr. Georgana Curio was consulted and he performed an I&D of the  hematoma.  He did not really get completely better from this and was treated  as an outpatient over the next three weeks and did not heal.  Eventually on  10/15/2001, he had Dr. Georgana Curio re-irrigate and debride the area and removed  the hematoma.  I have no available cultures from the office.  On 10/29/2001,  his symptoms increased with fever, redness, swelling and he went to Dr.  Jobe Gibbon office.  Workup was negative for a source other than his leg on  10/31/2001.  He had increasing symptomatology and was admitted from the  office by Dr. Orson Slick.  He is currently on service with Dr. Georgana Curio.  He has  been on IV Zosyn since Friday with resolution of his swelling, fever, and  local signs over  the weekend.  MRI scans obtained previously, which was read  as negative.  Recent MRI scan was read as a possible Brodie's abscess.  At  this time, the patient is afebrile and comfortable and no longer draining  and symptomatic and the question is at this point in time, is whether there  is infection or not in the bone.  History is significant for the patient  having chronic venous changes for 30 years secondary to what he calls poison  oak, but he has had chronic changes and venous insufficiency and swelling of  the left lower extremity for 30 years.  He also has type 2 diabetes.   PHYSICAL EXAMINATION:  GENERAL:  He is a very pleasant gentleman, awake and  alert.  He answers questions appropriately.  He is  afebrile.  LOWER EXTREMITY:  He has chronic venous changes.  It is larger.  There is no  evidence of deep venous thrombosis.  He has a 2 cm open area on the  anterolateral aspect of his leg possibly 1 handbreadth above the ankle where  the area of his hematoma was drained.  There was no active drainage or  purulence.  There is no evidence of active cellulitis, no hemangiomatous.  He has good pulses distally and full range of motion without pain.  There is  no septic joint.   LABORATORY STUDIES:  White count on admission was normal.  His sed rate is  not available.  Cultures from the office were not available.  Plain x-rays  show no significant abnormality, but there may be a mild periostial reaction  on the medial tibial.  MRI scan was read with the radiologist, Dr. Constance Goltz,  and reviewed several times and compared to his previous MRI scan and there  is a lesion in the distal tibia consistent with either a Brodie's abscess, a  stress reaction, or a degenerative change.   I discussed this with intervention radiologist and would recommend getting a  CT scan.   IMPRESSION:  Recurrent cellulitis left lower extremity and infected hematoma  well drained.  Rule out bone infection.  Chronic  swelling, venous changes,  and diabetes all increase chance of recurrence.   PLAN:  As above, we will plan a CT scan.  If it can be localized by CT scan,  then images radiologist will perform a needle guided aspiration biopsy of  the region and see if this is infected tissue or not.  We will then decide  further treatment based upon that.  All of this was explained to the patient  in detail and he concurs with the plan and wishes to proceed.  Of note, the  patient has not received tetanus tox per his history, therefore, tetanus tox  will be administered due to the fact that it has not been given and I cannot  find evidence in the chart that this has been given.  Further plans as above  noted study.                                                Erasmo Leventhal, MD    RAC/MEDQ  D:  11/05/2001  T:  11/09/2001  Job:  [id number]   cc:   Morley Kos

## 2010-11-10 LAB — BASIC METABOLIC PANEL WITH GFR
BUN: 15
BUN: 17
BUN: 18
CO2: 25
CO2: 25
CO2: 27
Calcium: 8.6
Calcium: 8.7
Calcium: 8.7
Chloride: 102
Chloride: 103
Chloride: 104
Creatinine, Ser: 1.18
Creatinine, Ser: 1.29
Creatinine, Ser: 1.39
GFR calc non Af Amer: 51 — ABNORMAL LOW
GFR calc non Af Amer: 56 — ABNORMAL LOW
GFR calc non Af Amer: >60
Glucose, Bld: 111 — ABNORMAL HIGH
Glucose, Bld: 164 — ABNORMAL HIGH
Glucose, Bld: 229 — ABNORMAL HIGH
Potassium: 3.9
Potassium: 4
Potassium: 4
Sodium: 135
Sodium: 137
Sodium: 137

## 2010-11-10 LAB — CBC
HCT: 31.6 — ABNORMAL LOW
HCT: 32.6 — ABNORMAL LOW
HCT: 34.1 — ABNORMAL LOW
Hemoglobin: 11.3 — ABNORMAL LOW
Hemoglobin: 11.3 — ABNORMAL LOW
Hemoglobin: 11.7 — ABNORMAL LOW
MCHC: 33.6
MCHC: 33.6
MCHC: 33.8
MCHC: 34.5
MCHC: 34.7
MCHC: 35.7
MCV: 82.1
MCV: 82.8
MCV: 83.9
Platelets: 317
Platelets: 322
Platelets: 340
Platelets: 354
RBC: 3.97 — ABNORMAL LOW
RBC: 4.21 — ABNORMAL LOW
RDW: 14.4
RDW: 14.5
RDW: 14.7
RDW: 14.7
RDW: 14.8
RDW: 15.1
WBC: 5
WBC: 5.7

## 2010-11-10 LAB — LIPID PANEL
Triglycerides: 369 — ABNORMAL HIGH
VLDL: 74 — ABNORMAL HIGH

## 2010-11-10 LAB — PROTIME-INR
INR: 1.9 — ABNORMAL HIGH
INR: 2 — ABNORMAL HIGH
INR: 2 — ABNORMAL HIGH
INR: 2.3 — ABNORMAL HIGH
Prothrombin Time: 22.5 — ABNORMAL HIGH
Prothrombin Time: 23.3 — ABNORMAL HIGH
Prothrombin Time: 23.5 — ABNORMAL HIGH
Prothrombin Time: 25.6 — ABNORMAL HIGH
Prothrombin Time: 29.2 — ABNORMAL HIGH

## 2010-11-10 LAB — HEPARIN LEVEL (UNFRACTIONATED)
Heparin Unfractionated: 0.42
Heparin Unfractionated: 0.54

## 2010-11-10 LAB — APTT: aPTT: 30

## 2010-11-10 LAB — HEPATIC FUNCTION PANEL
ALT: 35
AST: 30
Albumin: 3.3 — ABNORMAL LOW
Alkaline Phosphatase: 35 — ABNORMAL LOW
Bilirubin, Direct: <0.1
Total Bilirubin: 0.8
Total Protein: 5.9 — ABNORMAL LOW

## 2010-11-10 LAB — HEMOGLOBIN A1C
Hgb A1c MFr Bld: 7.6 — ABNORMAL HIGH
Mean Plasma Glucose: 193

## 2010-11-10 LAB — TSH: TSH: 4.006

## 2010-11-10 LAB — MAGNESIUM: Magnesium: 2.3

## 2010-11-17 LAB — I-STAT 8, (EC8 V) (CONVERTED LAB)
Acid-Base Excess: 1
HCT: 45
Hemoglobin: 15.3
Operator id: 146091
Potassium: 4.1
Sodium: 138
TCO2: 29
pH, Ven: 7.333 — ABNORMAL HIGH

## 2010-11-17 LAB — CBC
HCT: 39.5
HCT: 42.9
Hemoglobin: 12.9 — ABNORMAL LOW
Hemoglobin: 14.3
Hemoglobin: 14.5
MCHC: 33.8
MCV: 84.5
MCV: 85.1
Platelets: 282
RBC: 4.44
RBC: 4.98
RDW: 13.4
RDW: 13.5
RDW: 13.6

## 2010-11-17 LAB — COMPREHENSIVE METABOLIC PANEL
BUN: 19
Calcium: 8.9
Glucose, Bld: 129 — ABNORMAL HIGH
Total Protein: 6.1

## 2010-11-17 LAB — PROTIME-INR
INR: 1.2
INR: 1.2
INR: 1.2
INR: 1.8 — ABNORMAL HIGH
INR: 1.9 — ABNORMAL HIGH
INR: 2.5 — ABNORMAL HIGH
INR: 3 — ABNORMAL HIGH
Prothrombin Time: 15
Prothrombin Time: 15.2
Prothrombin Time: 17.8 — ABNORMAL HIGH
Prothrombin Time: 21.2 — ABNORMAL HIGH
Prothrombin Time: 28 — ABNORMAL HIGH
Prothrombin Time: 31.9 — ABNORMAL HIGH

## 2010-11-17 LAB — DIFFERENTIAL
Basophils Absolute: 0
Basophils Relative: 0
Eosinophils Absolute: 0.1
Eosinophils Relative: 1
Monocytes Absolute: 0.9 — ABNORMAL HIGH
Monocytes Relative: 10

## 2010-11-17 LAB — BASIC METABOLIC PANEL
BUN: 19
BUN: 20
CO2: 25
Chloride: 103
Chloride: 104
Chloride: 105
Creatinine, Ser: 1.21
GFR calc Af Amer: >60
GFR calc Af Amer: >60
GFR calc Af Amer: >60
GFR calc non Af Amer: 53 — ABNORMAL LOW
Potassium: 3.5
Potassium: 3.5
Potassium: 3.6
Sodium: 136

## 2010-11-17 LAB — APTT: aPTT: 43 — ABNORMAL HIGH

## 2010-11-17 LAB — POCT I-STAT CREATININE
Creatinine, Ser: 1.5
Operator id: 146091

## 2010-11-17 LAB — CK TOTAL AND CKMB (NOT AT ARMC)
CK, MB: 3.9
Total CK: 192

## 2010-11-17 LAB — POCT CARDIAC MARKERS: Troponin i, poc: 0.15 — ABNORMAL HIGH

## 2010-12-04 DIAGNOSIS — Z9289 Personal history of other medical treatment: Secondary | ICD-10-CM

## 2010-12-04 HISTORY — DX: Personal history of other medical treatment: Z92.89

## 2011-01-18 ENCOUNTER — Other Ambulatory Visit: Payer: Self-pay | Admitting: Family Medicine

## 2011-12-20 ENCOUNTER — Other Ambulatory Visit (HOSPITAL_COMMUNITY): Payer: Self-pay | Admitting: *Deleted

## 2011-12-20 DIAGNOSIS — I10 Essential (primary) hypertension: Secondary | ICD-10-CM

## 2011-12-25 ENCOUNTER — Other Ambulatory Visit (HOSPITAL_COMMUNITY): Payer: Self-pay | Admitting: Cardiovascular Disease

## 2011-12-25 DIAGNOSIS — E119 Type 2 diabetes mellitus without complications: Secondary | ICD-10-CM

## 2011-12-28 ENCOUNTER — Encounter (HOSPITAL_COMMUNITY): Payer: Self-pay

## 2011-12-31 ENCOUNTER — Other Ambulatory Visit (HOSPITAL_COMMUNITY): Payer: Self-pay | Admitting: Cardiovascular Disease

## 2011-12-31 DIAGNOSIS — I10 Essential (primary) hypertension: Secondary | ICD-10-CM

## 2011-12-31 DIAGNOSIS — E119 Type 2 diabetes mellitus without complications: Secondary | ICD-10-CM

## 2012-01-02 ENCOUNTER — Inpatient Hospital Stay (HOSPITAL_COMMUNITY): Admission: RE | Admit: 2012-01-02 | Payer: Self-pay | Source: Ambulatory Visit

## 2012-01-09 ENCOUNTER — Encounter (HOSPITAL_COMMUNITY): Payer: Self-pay

## 2012-01-17 ENCOUNTER — Encounter (HOSPITAL_COMMUNITY): Payer: Self-pay

## 2012-10-29 ENCOUNTER — Telehealth: Payer: Self-pay | Admitting: Cardiovascular Disease

## 2012-10-29 NOTE — Telephone Encounter (Signed)
Message forwarded to K. Vogel, RN.  

## 2012-10-29 NOTE — Telephone Encounter (Signed)
Dr Margarita Grizzle is requesting surgical clearance for a dorsal slit and wants patient to hold asa and warfarin 5 days prior to the procedure.  I gave this info to [facility] and Dr Alanda Amass

## 2012-10-30 NOTE — Telephone Encounter (Signed)
Cardiac clearence  Faxed out 10-29-12

## 2012-11-13 ENCOUNTER — Telehealth: Payer: Self-pay | Admitting: Cardiovascular Disease

## 2012-11-13 NOTE — Telephone Encounter (Signed)
Left message---ask patient to call back to see  What DME COMPANY he wants to use? IF KNOW PREFERENCE  ,office will make a decision.  Will defer to WANDA/ DR [facility]

## 2012-11-13 NOTE — Telephone Encounter (Signed)
Sleep machine stopped working,wants to know where can he get another one?If he does not answer,please leave a message.

## 2012-11-14 NOTE — Telephone Encounter (Signed)
Referred to [facility]. Records faxed to [phone]. Also  Called and told them to expect the referral and that the patient has been without his machine for 3 nights. Asked if possible, can someone contact this patient today.

## 2012-11-14 NOTE — Telephone Encounter (Signed)
Forwarded to my box

## 2012-11-17 ENCOUNTER — Telehealth: Payer: Self-pay | Admitting: Cardiovascular Disease

## 2012-11-17 NOTE — Telephone Encounter (Signed)
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

## 2012-11-17 NOTE — Telephone Encounter (Signed)
Spoke with patient informing him that the referral was done last week. I called [facility] to confirm that the information was received. They informed me that it was, and they will be contacting patient. Patient notified and also given their phone #.

## 2012-11-21 ENCOUNTER — Telehealth: Payer: Self-pay | Admitting: Cardiovascular Disease

## 2012-11-21 NOTE — Telephone Encounter (Signed)
Message send to Claiborne Rigg to advise.

## 2012-11-21 NOTE — Telephone Encounter (Signed)
Still waiting for an appt with his C Pac machine.Have not heard from [facility] to hear from her.

## 2012-11-21 NOTE — Telephone Encounter (Signed)
Message forwarded to Claiborne Rigg to advise.

## 2012-11-23 NOTE — Telephone Encounter (Signed)
Patient walked into office. Situation taken care of.

## 2012-11-25 ENCOUNTER — Other Ambulatory Visit: Payer: Self-pay | Admitting: Dermatology

## 2012-11-26 ENCOUNTER — Ambulatory Visit: Payer: Self-pay | Admitting: Cardiovascular Disease

## 2012-12-04 ENCOUNTER — Encounter: Payer: Self-pay | Admitting: Cardiovascular Disease

## 2012-12-04 ENCOUNTER — Ambulatory Visit (INDEPENDENT_AMBULATORY_CARE_PROVIDER_SITE_OTHER): Payer: BC Managed Care – PPO | Admitting: Cardiovascular Disease

## 2012-12-04 VITALS — BP 152/78 | Ht 70.0 in | Wt 335.1 lb

## 2012-12-04 DIAGNOSIS — Z86711 Personal history of pulmonary embolism: Secondary | ICD-10-CM

## 2012-12-04 DIAGNOSIS — R600 Localized edema: Secondary | ICD-10-CM

## 2012-12-04 DIAGNOSIS — I10 Essential (primary) hypertension: Secondary | ICD-10-CM

## 2012-12-04 DIAGNOSIS — Z7901 Long term (current) use of anticoagulants: Secondary | ICD-10-CM

## 2012-12-04 DIAGNOSIS — R011 Cardiac murmur, unspecified: Secondary | ICD-10-CM

## 2012-12-04 DIAGNOSIS — I119 Hypertensive heart disease without heart failure: Secondary | ICD-10-CM

## 2012-12-04 DIAGNOSIS — R609 Edema, unspecified: Secondary | ICD-10-CM

## 2012-12-04 DIAGNOSIS — G4733 Obstructive sleep apnea (adult) (pediatric): Secondary | ICD-10-CM

## 2012-12-04 DIAGNOSIS — E785 Hyperlipidemia, unspecified: Secondary | ICD-10-CM

## 2012-12-04 DIAGNOSIS — I872 Venous insufficiency (chronic) (peripheral): Secondary | ICD-10-CM

## 2012-12-04 DIAGNOSIS — M7989 Other specified soft tissue disorders: Secondary | ICD-10-CM

## 2012-12-04 DIAGNOSIS — Z905 Acquired absence of kidney: Secondary | ICD-10-CM

## 2012-12-04 MED ORDER — FUROSEMIDE 40 MG PO TABS: ORAL_TABLET | ORAL | Status: DC

## 2012-12-04 MED ORDER — CARVEDILOL 12.5 MG PO TABS: ORAL_TABLET | ORAL | Status: DC

## 2012-12-04 NOTE — Patient Instructions (Signed)
Your physician has recommended you make the following change in your medication: STOP the amlodipine. Increase the carvedilol to 18.75 mg twice daily. (1 &1/5 tablet twice daily) take the furosemide 40 mg twice daily X 3days, then go back to 40 mg once daily.  Your physician recommends that you return for lab work fasting.  Your physician has recommended that you have a sleep study. This test records several body functions during sleep, including: brain activity, eye movement, oxygen and carbon dioxide blood levels, heart rate and rhythm, breathing rate and rhythm, the flow of air through your mouth and nose, snoring, body muscle movements, and chest and belly movement.  Your physician has requested that you have an echocardiogram. Echocardiography is a painless test that uses sound waves to create images of your heart. It provides your doctor with information about the size and shape of your heart and how well your heart's chambers and valves are working. This procedure takes approximately one hour. There are no restrictions for this procedure.  Your physician recommends that you schedule a follow-up appointment in: 6 WEEKS.

## 2012-12-04 NOTE — Progress Notes (Signed)
Patient ID: [name], male   DOB: [birth year], 72 y.o.   MRN: [id number]     PATIENT PROFILE: [name] is a 72 year old former patient of Dr. Susa Villegas who presents to the office today to establish cardiology care with me since Dr. Alanda Villegas has retired from a solo practice and also to evaluate his need for a new CPAP machine in this patient with prior diagnosis of severe obstructive sleep apnea.   HPI: This [name] has a complex medical history. Apparently, he has a history of severe exogenous obesity, venous insufficiency, history of prostate cancer and also status post left nephrectomy. He has had difficulty with hypertension. He also said significant problems with lower extremity edema and has documented venous insufficiency of both his right and left superficial saphenous veins. He has a remote history of DVT/PE and has been on anticoagulation therapy with Coumadin.  Apparently, in 2009 he was referred for a sleep study. This revealed severe obstructive sleep apnea with an AHI on the baseline portion of a split night protocol in excess of 100. Since 2009 he has been using CPAP therapy and has been on a 14 cm water pressure. Apparently, last month his machine no longer functioned. With a 10 day period without the unit he could not sleep well. He had a canceled numerous appointments due to marked residual daytime sleepiness and the sensation that he would fall asleep while driving. Recently he past 10 days he was given a [facility] by [facility] since his prior [employer] SMS is no longer available. He was given a CPAP although unit and apparently a review of this short download indicates that this has only been titrated up to 12 cm and he had a residual AHI improved at 2.9. Her, the patient states he has not slept as well with the CPAP although unit as it had in the past with his fixed pressure which was set at a higher pressure. In order to get a new CPAP unit he now  presents for evaluation.  Additionally, he does admit to weight gain. He admits to progressive lower extremity swelling. Her review of records from Dr. Alanda Villegas reveal that he had a nuclear stress test in May 2011 but was reportedly nonischemic. His last echo Doppler study was in 2012 which showed moderate concentric left ventricular hypertrophy with an ejection fraction of greater than 55%. He did have mild pulmonary hypertension with estimated RV systolic pressure 39 mm. He did have aortic sclerosis without stenosis, trace mitral regurgitation, and trace tricuspid regurgitation.  Past Medical History  Diagnosis Date  . Obesity   . Phlebitis     Lower extermity  . Pulmonary emboli 2008  . Prostate cancer 07/2009  . Sleep apnea     on CPAP  . Hx of echocardiogram 12/04/2010    Normal EF >55% no significant valve disease  . History of stress test 06/27/2009    Low risk and EF of approximately 50%  . DVT (deep venous thrombosis)   . Diabetes mellitus     Past Surgical History  Procedure Laterality Date  . Nephrectomy  1999    for CA  . Cholecystectomy  1999    No Known Allergies  Current Outpatient Prescriptions  Medication Sig Dispense Refill  . alendronate (FOSAMAX) 70 MG tablet Take 1 tablet by mouth once a week.      Marland Kitchen aspirin 81 MG tablet Take 81 mg by mouth daily.      Marland Kitchen  betamethasone dipropionate (DIPROLENE) 0.05 % cream Apply 1 application topically daily.      . Calcium Carb-Cholecalciferol (CALCIUM-VITAMIN D) 600-400 MG-UNIT TABS Take 1 tablet by mouth 2 (two) times daily.      . carvedilol (COREG) 12.5 MG tablet Take 1 & 1/2 tablet twice daily  90 tablet  6  . diazepam (VALIUM) 10 MG tablet Take 1 tablet by mouth daily.      . fenofibrate (TRICOR) 145 MG tablet Take 1 tablet by mouth daily.      . fish oil-omega-3 fatty acids 1000 MG capsule Take 1,200 g by mouth 2 (two) times daily.      Marland Kitchen glucosamine-chondroitin 500-400 MG tablet Take 1,500 tablets by mouth 2 (two)  times daily.      Marland Kitchen KLOR-CON M10 10 MEQ tablet Take 1 tablet by mouth daily.      . Multiple Vitamin (MULTIVITAMIN WITH MINERALS) TABS tablet Take 1 tablet by mouth daily.      . Niacin CR 1000 MG TBCR Take 1 tablet by mouth 2 (two) times daily.      Marland Kitchen NOVOLOG MIX 70/30 (70-30) 100 UNIT/ML injection 2 (two) times daily.      . quinapril (ACCUPRIL) 40 MG tablet Take 1 tablet by mouth 2 (two) times daily.      . tamsulosin (FLOMAX) 0.4 MG CAPS capsule Take 1 capsule by mouth daily.      Marland Kitchen warfarin (COUMADIN) 5 MG tablet Take 1 tablet by mouth.      . furosemide (LASIX) 40 MG tablet Take 1 tablet twice daily for 3 days then 1 tablet daily  40 tablet  6   No current facility-administered medications for this visit.    Social history is notable in that he is married. 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 He completed 12th grade. There is no recent tobacco or alcohol use.  Family History  Problem Relation Age of Onset  . Cancer Mother   . Diabetes Father   . Cancer Father     ROS is negative for fever chills or night sweats. He denies any rashes or skin lesions. He denies visual changes. He does note shortness of breath with activity. He denies chest pressure. He is unaware of palpitations. He cannot sleep without CPAP. He does snore without CPAP. Since she's been back on the loner machine he feels improved but not as well as he had previously before his old machine malfunctioned. He does note weight gain. He admits to progressive leg swelling. He denies blood in the stool or urine. He denies change in bowel or bladder habits. He admits that his diet has not been very good with reference to compliance. He denies recent bleeding. He does have hyperlipidemia on therapy with fetal fibroid. His only been taking the Lasix every other day despite his progressive leg swelling. He denies psychological issues. Is unaware of diabetes. Other comprehensive 12  point system review is negative.  PE BP 152/78  Ht 5\' 10"  (1.778 m)  Wt 335 lb 1.6 oz (152 kg)  BMI 48.08 kg/m2 General: Alert, oriented, no distress; morbidly obese  Skin: normal turgor, no rashes HEENT: Normocephalic, atraumatic. Pupils round and reactive; sclera anicteric; Fundi arteriolar narrowing. Nose without nasal septal hypertrophy Mouth/Parynx benign; Mallinpatti scale 3 Neck: No JVD, no carotid briuts Lungs: clear to ausculatation and percussion; no wheezing or rales Heart: RRR, s1 s2 normal 2/6 systolic murmur in the  aortic region and left lower border Abdomen: Marked central adiposity;soft, nontender; no hepatosplenomehaly, BS+; abdominal aorta nontender and not dilated by palpation. Pulses 2+ Extremities: 3-4+ over LE edema up to the knees; no clubbinbg cyanosis, Homan's sign negative  Neurologic: grossly nonfocal; no weakness. Psychological: Normal affect and mood.    ECG: Sinus rhythm with first-degree block.  LABS:  BMET    Component Value Date/Time   NA 138 07/20/2009 0450   K 3.6 07/20/2009 0450   CL 103 07/20/2009 0450   CO2 28 07/20/2009 0450   GLUCOSE 71 07/20/2009 0450   BUN 19 07/20/2009 0450   CREATININE 1.31 07/20/2009 0450   CALCIUM 8.5 07/20/2009 0450   GFRNONAA 55* 07/20/2009 0450   GFRAA  Value: >60        The eGFR has been calculated using the MDRD equation. This calculation has not been validated in all clinical situations. eGFR's persistently <60 mL/min signify possible Chronic Kidney Disease. 07/20/2009 0450     Hepatic Function Panel     Component Value Date/Time   PROT 6.1 07/17/2009 1104   ALBUMIN 3.3* 07/17/2009 1104   AST 24 07/17/2009 1104   ALT 25 07/17/2009 1104   ALKPHOS 30* 07/17/2009 1104   BILITOT 1.1 07/17/2009 1104   BILIDIR <0.1 01/21/2007 0450   IBILI NOT CALCULATED 01/21/2007 0450     CBC    Component Value Date/Time   WBC 7.1 07/20/2009 0450   RBC 4.53 07/20/2009 0450   HGB 13.1 07/20/2009 0450   HCT 39.1 07/20/2009 0450     PLT 177 07/20/2009 0450   MCV 86.2 07/20/2009 0450   MCHC 33.5 07/20/2009 0450   RDW 14.1 07/20/2009 0450   LYMPHSABS 0.5* 07/15/2009 2222   MONOABS 0.0* 07/15/2009 2222   EOSABS 0.1 07/15/2009 2222   BASOSABS 0.0 07/15/2009 2222     BNP No results found for this basename: probnp    Lipid Panel     Component Value Date/Time   CHOL  Value: 110        ATP III CLASSIFICATION:  <200     mg/dL   Desirable  782-956  mg/dL   Borderline High  >=213    mg/dL   High 08/65/7846 9629   TRIG 369* 01/21/2007 0450   HDL 21* 01/21/2007 0450   CHOLHDL 5.2 01/21/2007 0450   VLDL 74* 01/21/2007 0450   LDLCALC  Value: 15        Total Cholesterol/HDL:CHD Risk Coronary Heart Disease Risk Table                     Men   Women  1/2 Average Risk   3.4   3.3 01/21/2007 0450     RADIOLOGY: No results found.   ASSESSMENT AND PLAN: I spent considerable time with [name] and review Dr. Kandis Cocking records. His history is complex. Apparently, he has been on CPAP therapy since 2009 was diagnostic polysomnogram reveals severe obstructive sleep apnea with an AHI in the baseline portion of a split night protocol at 109.53 without REM sleep development. He has significant oxygen desaturation to 74% and had loud snoring. Since  Almost 6 years have elapsed since his diagnostic study and he is in need for a new machine, I am recommending that another split night protocol be obtained so we can ascertain optimal therapy prior to purchasing a new seen. In the past, he had done well on a fixed pressure at 14 cm. I have reviewed previous downloads.  He has been referred to [facility] for his new M[employer] company will provide him with his most recent loaner machine until his new one is obtained. He does have 3-4+ lower extremity edema. I am recommending he take Lasix 40 mg twice a day for the next 3 days then 40 mg daily. Electing to discontinue his amlodipine which undoubtedly may be contributing to his lower extremity edema. I  will further titrate his carvedilol to 18.75 mg twice a day. In light of his cardiac murmur which is significant, I am referring him for a followup echo Doppler study for further evaluation. I'll see him back in the office in 6 weeks for followup evaluation. Also checking complete set of laboratory in a fasting state consisting of a CBC, CMP, lipid panel, TSH, and will also check a urinalysis.    Lennette Bihari, MD, [facility] 12/04/2012 3:19 PM

## 2012-12-09 ENCOUNTER — Telehealth: Payer: Self-pay | Admitting: *Deleted

## 2012-12-09 NOTE — Telephone Encounter (Signed)
Faxed prescription to get a new CPAP machine with last office note to [facility].

## 2012-12-12 ENCOUNTER — Encounter: Payer: Self-pay | Admitting: Cardiovascular Disease

## 2012-12-16 ENCOUNTER — Ambulatory Visit (HOSPITAL_COMMUNITY)
Admission: RE | Admit: 2012-12-16 | Discharge: 2012-12-16 | Disposition: A | Discharge status: Home / Self Care | Payer: BC Managed Care – PPO | Source: Ambulatory Visit | Attending: Cardiovascular Disease | Admitting: Cardiovascular Disease

## 2012-12-16 DIAGNOSIS — G4730 Sleep apnea, unspecified: Secondary | ICD-10-CM | POA: Insufficient documentation

## 2012-12-16 DIAGNOSIS — I10 Essential (primary) hypertension: Secondary | ICD-10-CM | POA: Insufficient documentation

## 2012-12-16 DIAGNOSIS — E785 Hyperlipidemia, unspecified: Secondary | ICD-10-CM | POA: Insufficient documentation

## 2012-12-16 DIAGNOSIS — R011 Cardiac murmur, unspecified: Secondary | ICD-10-CM | POA: Insufficient documentation

## 2012-12-16 NOTE — Progress Notes (Signed)
2D Echo Performed 12/16/2012    Kreston Ahrendt, RCS  

## 2012-12-25 ENCOUNTER — Encounter: Payer: Self-pay | Admitting: *Deleted

## 2012-12-25 NOTE — Progress Notes (Signed)
Quick Note:    Letter sent to patient.  ______

## 2013-01-15 ENCOUNTER — Ambulatory Visit: Payer: BC Managed Care – PPO | Admitting: Cardiovascular Disease

## 2013-01-15 ENCOUNTER — Telehealth: Payer: Self-pay | Admitting: *Deleted

## 2013-01-15 ENCOUNTER — Ambulatory Visit (INDEPENDENT_AMBULATORY_CARE_PROVIDER_SITE_OTHER): Payer: BC Managed Care – PPO | Admitting: Cardiovascular Disease

## 2013-01-15 VITALS — BP 138/86 | HR 54 | Ht 70.0 in | Wt 332.4 lb

## 2013-01-15 DIAGNOSIS — R609 Edema, unspecified: Secondary | ICD-10-CM

## 2013-01-15 DIAGNOSIS — R600 Localized edema: Secondary | ICD-10-CM

## 2013-01-15 DIAGNOSIS — R011 Cardiac murmur, unspecified: Secondary | ICD-10-CM

## 2013-01-15 DIAGNOSIS — G4733 Obstructive sleep apnea (adult) (pediatric): Secondary | ICD-10-CM

## 2013-01-15 DIAGNOSIS — I10 Essential (primary) hypertension: Secondary | ICD-10-CM

## 2013-01-15 NOTE — Telephone Encounter (Addendum)
Message copied by Gaynelle Cage on Thu Jan 15, 2013 12:53 PM ------      Message from: Gaynelle Cage.      Created: Thu Jan 15, 2013 12:31 PM       Release lexiscan orders and send to patient. ------

## 2013-01-15 NOTE — Progress Notes (Signed)
Patient ID: MURICE BARBAR, male   DOB: 1941-01-21, 72 y.o.   MRN: [id number]     HPI:  [name] is a 72 year old former patient of Dr. Susa Griffins who establish cardiology care with me in October 2014 after Dr. Kandis Cocking retirement.  [name] has a complex medical history including severe exogenous obesity, venous insufficiency, history of prostate cancer and he is also status post left nephrectomy. He has had difficulty with hypertension. He also said significant problems with lower extremity edema and has documented venous insufficiency of both his right and left superficial saphenous veins. He has a remote history of DVT/PE and has been on anticoagulation therapy with Coumadin.  In 2009 he was referred for a sleep study which revealed severe obstructive sleep apnea with an AHI on the baseline portion of a split night protocol in excess of 100. Since 2009 he has been using CPAP therapy and has been on a 14 cm water pressure. Apparently, in September his machine no longer functioned. Within  a 10 day period without the unit he could not sleep well. He had a canceled numerous appointments due to marked residual daytime sleepiness and the sensation that he would fall asleep while driving. He was given a Contractor by [facility] since his prior [employer] SMS is no longer available. He was given a CPAP although unit and initial review of this short download indicates that this has only been titrated up to 12 cm and he had a residual AHI improved at 2.9. When I saw him in October, I referred him for a followup sleep study prior to obtaining a newly seen. The sleep study was done as a split night protocol on 12/26/2012. This again confirms severe obstructive sleep apnea with an HI overall of 84.9 per hour. He was unable to achieve REM sleep on the baseline portion of the study. Oxygen saturation dropped to 83% with non-REM sleep. He was loud snoring. CPAP therapy was instituted and was  titrated up to 16 cm water pressure which was the recommended pressure. He will be getting a new machine and will be referred to [facility] for this since they have provided him with the loner unit prior to his most recent study.  Additionally, he does admit to weight gain. He admits to progressive lower extremity swelling. When I last saw him, I discontinued his amlodipine which was undoubtedly playing a contributory role in his previous 3-4+ lower edema  Also further titrate his carvedilol and 5 mg twice a day. In light of his cardiac murmur I did refer him for a followup echo Doppler study. Review of records from Dr. Alanda Amass reveal that he had a nuclear stress test in May 2011 but was reportedly nonischemic. His last echo Doppler study was in 2012 which showed moderate concentric left ventricular hypertrophy with an ejection fraction of greater than 55%. He did have mild pulmonary hypertension with estimated RV systolic pressure 39 mm. He did have aortic sclerosis without stenosis, trace mitral regurgitation, and trace tricuspid regurgitation. One is mostly consistent echo Doppler study of 12/16/2012 ejection fraction was 55-60%. There was mild mitral regurgitation, aortic sclerosis without stenosis. Estimated RV systolic pressure was normal at 23 mm.  Past Medical History  Diagnosis Date  . Obesity   . Phlebitis     Lower extermity  . Pulmonary emboli 2008  . Prostate cancer 07/2009  . Sleep apnea     on CPAP  . Hx of echocardiogram 12/04/2010  Normal EF >55% no significant valve disease  . History of stress test 06/27/2009    Low risk and EF of approximately 50%  . DVT (deep venous thrombosis)   . Diabetes mellitus     Past Surgical History  Procedure Laterality Date  . Nephrectomy  1999    for CA  . Cholecystectomy  1999    No Known Allergies  Current Outpatient Prescriptions  Medication Sig Dispense Refill  . alendronate (FOSAMAX) 70 MG tablet Take 1 tablet by mouth once a  week.      Marland Kitchen aspirin 81 MG tablet Take 81 mg by mouth daily.      . betamethasone dipropionate (DIPROLENE) 0.05 % cream Apply 1 application topically daily.      . Calcium Carb-Cholecalciferol (CALCIUM-VITAMIN D) 600-400 MG-UNIT TABS Take 1 tablet by mouth 2 (two) times daily.      . carvedilol (COREG) 12.5 MG tablet Take 1 & 1/2 tablet twice daily  90 tablet  6  . diazepam (VALIUM) 10 MG tablet Take 1 tablet by mouth daily.      . fenofibrate (TRICOR) 145 MG tablet Take 1 tablet by mouth daily.      . fish oil-omega-3 fatty acids 1000 MG capsule Take 1,200 g by mouth 2 (two) times daily.      . furosemide (LASIX) 40 MG tablet Take 1 tablet twice daily for 3 days then 1 tablet daily  40 tablet  6  . glucosamine-chondroitin 500-400 MG tablet Take 1,500 tablets by mouth 2 (two) times daily.      Marland Kitchen KLOR-CON M10 10 MEQ tablet Take 1 tablet by mouth daily.      . Multiple Vitamin (MULTIVITAMIN WITH MINERALS) TABS tablet Take 1 tablet by mouth daily.      . Niacin CR 1000 MG TBCR Take 1 tablet by mouth 2 (two) times daily.      Marland Kitchen NOVOLOG MIX 70/30 (70-30) 100 UNIT/ML injection 2 (two) times daily.      . quinapril (ACCUPRIL) 40 MG tablet Take 1 tablet by mouth 2 (two) times daily.      . tamsulosin (FLOMAX) 0.4 MG CAPS capsule Take 1 capsule by mouth daily.      Marland Kitchen warfarin (COUMADIN) 5 MG tablet Take 1 tablet by mouth.       No current facility-administered medications for this visit.    Social history is notable in that he is married. 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 He completed 12th grade. There is no recent tobacco or alcohol use.  Family History  Problem Relation Age of Onset  . Cancer Mother   . Diabetes Father   . Cancer Father     ROS is negative for fever chills or night sweats. He does have morbid obesity. He denies any rashes or skin lesions. He denies visual changes. He does note shortness of breath with activity. He denies  chest pressure. He is unaware of palpitations. He cannot sleep without CPAP. He does snore without CPAP.   He does note weight gain. He admits to progressive leg swelling. He denies blood in the stool or urine. He denies change in bowel or bladder habits. He admits that his diet has not been very good with reference to compliance. He denies recent bleeding. He does have hyperlipidemia on therapy.  He denies psychological issues. Is unaware of diabetes. Other comprehensive 12 point system review is negative.  PE BP  138/86  Pulse 54  Ht 5\' 10"  (1.778 m)  Wt 332 lb 6.4 oz (150.776 kg)  BMI 47.69 kg/m2 General: Alert, oriented, no distress; morbidly obese  Skin: normal turgor, no rashes HEENT: Normocephalic, atraumatic. Pupils round and reactive; sclera anicteric; Fundi arteriolar narrowing. Nose without nasal septal hypertrophy Mouth/Parynx benign; Mallinpatti scale 3/4 Neck: No JVD, no carotid briuts; normal upstroke Lungs: clear to ausculatation and percussion; no wheezing or rales Heart: RRR, s1 s2 normal 2/6 systolic murmur in the aortic region and left lower border Abdomen: Marked central adiposity;soft, nontender; no hepatosplenomehaly, BS+; abdominal aorta nontender and not dilated by palpation. Pulses 2+ Extremities: 2+ LE edema up to the knees; no clubbing cyanosis, Homan's sign negative  Neurologic: grossly nonfocal; no weakness. Psychological: Normal affect and mood.    ECG: Sinus rhythm with first-degree block.  LABS:  BMET    Component Value Date/Time   NA 138 07/20/2009 0450   K 3.6 07/20/2009 0450   CL 103 07/20/2009 0450   CO2 28 07/20/2009 0450   GLUCOSE 71 07/20/2009 0450   BUN 19 07/20/2009 0450   CREATININE 1.31 07/20/2009 0450   CALCIUM 8.5 07/20/2009 0450   GFRNONAA 55* 07/20/2009 0450   GFRAA  Value: >60        The eGFR has been calculated using the MDRD equation. This calculation has not been validated in all clinical situations. eGFR's persistently <60 mL/min  signify possible Chronic Kidney Disease. 07/20/2009 0450     Hepatic Function Panel     Component Value Date/Time   PROT 6.1 07/17/2009 1104   ALBUMIN 3.3* 07/17/2009 1104   AST 24 07/17/2009 1104   ALT 25 07/17/2009 1104   ALKPHOS 30* 07/17/2009 1104   BILITOT 1.1 07/17/2009 1104   BILIDIR <0.1 01/21/2007 0450   IBILI NOT CALCULATED 01/21/2007 0450     CBC    Component Value Date/Time   WBC 7.1 07/20/2009 0450   RBC 4.53 07/20/2009 0450   HGB 13.1 07/20/2009 0450   HCT 39.1 07/20/2009 0450   PLT 177 07/20/2009 0450   MCV 86.2 07/20/2009 0450   MCHC 33.5 07/20/2009 0450   RDW 14.1 07/20/2009 0450   LYMPHSABS 0.5* 07/15/2009 2222   MONOABS 0.0* 07/15/2009 2222   EOSABS 0.1 07/15/2009 2222   BASOSABS 0.0 07/15/2009 2222     BNP No results found for this basename: probnp    Lipid Panel     Component Value Date/Time   CHOL  Value: 110        ATP III CLASSIFICATION:  <200     mg/dL   Desirable  657-846  mg/dL   Borderline High  >=962    mg/dL   High 95/28/4132 4401   TRIG 369* 01/21/2007 0450   HDL 21* 01/21/2007 0450   CHOLHDL 5.2 01/21/2007 0450   VLDL 74* 01/21/2007 0450   LDLCALC  Value: 15        Total Cholesterol/HDL:CHD Risk Coronary Heart Disease Risk Table                     Men   Women  1/2 Average Risk   3.4   3.3 01/21/2007 0450     RADIOLOGY: No results found.   ASSESSMENT AND PLAN: [name] underwent his followup sleep study which again confirmed very severe obstructive sleep apnea. He was titrated up to a CPAP pressure of 16 cm water pressure and will now be getting a new machine and be on  higher pressures.  Previously, when he was without his machine he was unable to sleep well, had significant residual daytimes sleepiness and loud snoring with prior evidence for significant nocturnal oxygen desaturation. He now feels somewhat improved with restoration of CPAP therapy. I did review his echo Doppler study which shows moderate concentric left ventricular  hypertrophy and normal systolic function. He was bradycardic during the study making diastolic function evaluation less specific. There is evidence for aortic sclerosis without stenosis. His peripheral edema has improved with discontinuing his amlodipine. He now is on carvedilol 18.75 mg twice a day. He does have mixed hyperlipidemia and is on fenofibrate for his marked hypertriglyceridemia. Followup laboratory be obtained and adjustments will be made accordingly. I review them multiple medical problems with him in detail. I will see him in the sleep clinic in several months following reinstitution of CPAP therapy with his new machine and further recommendations will be made at that time.  Raymond Bihari, MD, [facility] 01/15/2013 12:04 PM

## 2013-01-15 NOTE — Telephone Encounter (Deleted)
Opened accidentally. No notes.

## 2013-01-15 NOTE — Patient Instructions (Signed)
Your physician has requested that you have a lexiscan myoview. For further information please visit [email or web address] Please follow instruction sheet, as given.  This will be done in May 2015.  Your physician recommends that you schedule a follow-up appointment in: 2 months or in the next [facility].

## 2013-02-03 ENCOUNTER — Telehealth: Payer: Self-pay | Admitting: Cardiovascular Disease

## 2013-02-03 NOTE — Telephone Encounter (Signed)
Going next week to get bloodwork done for family dr.  Dr Tresa Endo had requested when he did to include certain things he wanted checked . Please call need order written  He would like to pick up order  Please call

## 2013-02-03 NOTE — Telephone Encounter (Signed)
Returned call and pt verified x 2.  Pt informed message received.  Pt stated he couldn't find the papers from October.  Informed RN will leave a copy at the front desk for him to pick up.  Pt verbalized understanding and agreed w/ plan.

## 2013-02-10 ENCOUNTER — Encounter: Payer: Self-pay | Admitting: Cardiovascular Disease

## 2013-02-16 ENCOUNTER — Other Ambulatory Visit: Payer: Self-pay | Admitting: *Deleted

## 2013-02-16 MED ORDER — POTASSIUM CHLORIDE CRYS ER 10 MEQ PO TBCR: 10.0000 meq | EXTENDED_RELEASE_TABLET | Freq: Every day | ORAL | Status: DC

## 2013-02-25 ENCOUNTER — Encounter (HOSPITAL_COMMUNITY): Payer: Self-pay | Admitting: Emergency Medicine

## 2013-02-25 ENCOUNTER — Emergency Department (HOSPITAL_COMMUNITY)
Admission: EM | Admit: 2013-02-25 | Discharge: 2013-02-25 | Disposition: A | Discharge status: Home / Self Care | Payer: BC Managed Care – PPO | Attending: Emergency Medicine | Admitting: Emergency Medicine

## 2013-02-25 DIAGNOSIS — Z86711 Personal history of pulmonary embolism: Secondary | ICD-10-CM | POA: Insufficient documentation

## 2013-02-25 DIAGNOSIS — Z86718 Personal history of other venous thrombosis and embolism: Secondary | ICD-10-CM | POA: Insufficient documentation

## 2013-02-25 DIAGNOSIS — Z791 Long term (current) use of non-steroidal anti-inflammatories (NSAID): Secondary | ICD-10-CM | POA: Insufficient documentation

## 2013-02-25 DIAGNOSIS — Z7982 Long term (current) use of aspirin: Secondary | ICD-10-CM | POA: Insufficient documentation

## 2013-02-25 DIAGNOSIS — E119 Type 2 diabetes mellitus without complications: Secondary | ICD-10-CM | POA: Insufficient documentation

## 2013-02-25 DIAGNOSIS — Z8546 Personal history of malignant neoplasm of prostate: Secondary | ICD-10-CM | POA: Insufficient documentation

## 2013-02-25 DIAGNOSIS — G4730 Sleep apnea, unspecified: Secondary | ICD-10-CM | POA: Insufficient documentation

## 2013-02-25 DIAGNOSIS — Z7901 Long term (current) use of anticoagulants: Secondary | ICD-10-CM | POA: Insufficient documentation

## 2013-02-25 DIAGNOSIS — Y846 Urinary catheterization as the cause of abnormal reaction of the patient, or of later complication, without mention of misadventure at the time of the procedure: Secondary | ICD-10-CM | POA: Insufficient documentation

## 2013-02-25 DIAGNOSIS — Z87891 Personal history of nicotine dependence: Secondary | ICD-10-CM | POA: Insufficient documentation

## 2013-02-25 DIAGNOSIS — Z79899 Other long term (current) drug therapy: Secondary | ICD-10-CM | POA: Insufficient documentation

## 2013-02-25 DIAGNOSIS — E669 Obesity, unspecified: Secondary | ICD-10-CM | POA: Insufficient documentation

## 2013-02-25 DIAGNOSIS — T83091A Other mechanical complication of indwelling urethral catheter, initial encounter: Secondary | ICD-10-CM | POA: Insufficient documentation

## 2013-02-25 DIAGNOSIS — R339 Retention of urine, unspecified: Secondary | ICD-10-CM | POA: Insufficient documentation

## 2013-02-25 DIAGNOSIS — IMO0002 Reserved for concepts with insufficient information to code with codable children: Secondary | ICD-10-CM | POA: Insufficient documentation

## 2013-02-25 DIAGNOSIS — R319 Hematuria, unspecified: Secondary | ICD-10-CM

## 2013-02-25 LAB — URINE MICROSCOPIC-ADD ON

## 2013-02-25 LAB — URINALYSIS, ROUTINE W REFLEX MICROSCOPIC
Bilirubin Urine: NEGATIVE
GLUCOSE, UA: NEGATIVE mg/dL
Ketones, ur: NEGATIVE mg/dL
Nitrite: POSITIVE — AB
PH: 6 (ref 5.0–8.0)
Protein, ur: 100 mg/dL — AB
Specific Gravity, Urine: 1.021 (ref 1.005–1.030)
Urobilinogen, UA: 1 mg/dL (ref 0.0–1.0)

## 2013-02-25 NOTE — ED Notes (Signed)
Irrigated cath with 30 mls of NS  Catheter started draining  575 cc of tea colored urine noted on return Pt states he feels much better  Tolerated procedure well

## 2013-02-25 NOTE — ED Notes (Signed)
Pt states he recently was diagnosed with a urinary tract infection and was placed on antibiotics  Last Thursday he went to the dr and had blood in his urine and they placed a cath  Pt states tonight the catheter quit draining and he thinks it is blocked by a blood clot  Pt states he called the on call # and was told to come here

## 2013-02-25 NOTE — ED Provider Notes (Signed)
CSN: [id number]     Arrival date & time 02/25/13  0358 History   First MD Initiated Contact with Patient 02/25/13 0631     Chief Complaint  Patient presents with  . Urinary Retention   HPI Patient presents with hematuria and a obstructive catheter.  It sounds as though the patient developed hematuria after urinary tract infection resulting in need of placement of a catheter last week in the urology office.  This was placed under direct visualization by the urologist after some difficulty.  The patient states he had some hematuria for several days after his catheter placement but this seemed to clear.  Patient developed obstruction of his catheter this evening and has the feeling that he needs to urinate.  His catheter was flushed and triaged in he states he feels much better at this time and had a large output of some clots with bloody urine.  He feels fine at this time and states his catheter continues to drain.  He does not wish his catheter be changed at this time.  No nausea or vomiting.  No fevers or chills.   Past Medical History  Diagnosis Date  . Obesity   . Phlebitis     Lower extermity  . Pulmonary emboli 2008  . Prostate cancer 07/2009  . Sleep apnea     on CPAP  . Hx of echocardiogram 12/04/2010    Normal EF >55% no significant valve disease  . History of stress test 06/27/2009    Low risk and EF of approximately 50%  . DVT (deep venous thrombosis)   . Diabetes mellitus    Past Surgical History  Procedure Laterality Date  . Nephrectomy  1999    for CA  . Cholecystectomy  1999   Family History  Problem Relation Age of Onset  . Cancer Mother   . Diabetes Father   . Cancer Father    History  Substance Use Topics  . Smoking status: Former Smoker    Quit date: 12/05/1947  . Smokeless tobacco: Not on file  . Alcohol Use: Not on file    Review of Systems  All other systems reviewed and are negative.    Allergies  Review of patient's allergies indicates no known  allergies.  Home Medications   Current Outpatient Rx  Name  Route  Sig  Dispense  Refill  . alendronate (FOSAMAX) 70 MG tablet   Oral   Take 1 tablet by mouth every Monday.          Marland Kitchen aspirin 81 MG tablet   Oral   Take 81 mg by mouth daily.         . betamethasone dipropionate (DIPROLENE) 0.05 % cream   Topical   Apply 1 application topically every other day.          . Calcium Carb-Cholecalciferol (CALCIUM-VITAMIN D) 600-400 MG-UNIT TABS   Oral   Take 1 tablet by mouth 2 (two) times daily.         . carvedilol (COREG) 12.5 MG tablet   Oral   Take 12.5 mg by mouth 2 (two) times daily with a meal.         . fenofibrate (TRICOR) 145 MG tablet   Oral   Take 145 mg by mouth daily.          . fish oil-omega-3 fatty acids 1000 MG capsule   Oral   Take 1 g by mouth 2 (two) times daily.          Marland Kitchen  furosemide (LASIX) 40 MG tablet   Oral   Take 40 mg by mouth 2 (two) times daily.         Marland Kitchen glucosamine-chondroitin 500-400 MG tablet   Oral   Take 1,500 tablets by mouth 2 (two) times daily.         . Multiple Vitamin (MULTIVITAMIN WITH MINERALS) TABS tablet   Oral   Take 1 tablet by mouth daily.         . naproxen sodium (ANAPROX) 220 MG tablet   Oral   Take 220 mg by mouth 2 (two) times daily with a meal.         . Niacin CR 1000 MG TBCR   Oral   Take 1,000 mg by mouth at bedtime.          Marland Kitchen NOVOLOG MIX 70/30 (70-30) 100 UNIT/ML injection   Subcutaneous   Inject 70 Units into the skin 2 (two) times daily.          . potassium chloride (K-DUR,KLOR-CON) 10 MEQ tablet   Oral   Take 10 mEq by mouth 2 (two) times daily.         . quinapril (ACCUPRIL) 40 MG tablet   Oral   Take 40 mg by mouth 2 (two) times daily.          . tamsulosin (FLOMAX) 0.4 MG CAPS capsule   Oral   Take 0.4 mg by mouth daily.          Marland Kitchen warfarin (COUMADIN) 5 MG tablet   Oral   Take 5-7.5 mg by mouth daily. 5 mg on Monday and Thursday and 7.5 mg all other  days          BP 151/80  Pulse 51  Temp(Src) 98.3 F (36.8 C) (Oral)  Resp 18  Ht 5\' 10"  (1.778 m)  Wt 330 lb (149.687 kg)  BMI 47.35 kg/m2  SpO2 96% Physical Exam  Nursing note and vitals reviewed. Constitutional: He is oriented to person, place, and time. He appears well-developed and well-nourished.  HENT:  Head: Normocephalic.  Eyes: EOM are normal.  Neck: Normal range of motion.  Pulmonary/Chest: Effort normal.  Abdominal: He exhibits no distension.  Genitourinary:  Foley catheter in urethra without drainage of blood or urine around it.  Bloody urine without clots in leg bag  Musculoskeletal: Normal range of motion.  Neurological: He is alert and oriented to person, place, and time.  Psychiatric: He has a normal mood and affect.    ED Course  Procedures (including critical care time) Labs Review Labs Reviewed  URINALYSIS, ROUTINE W REFLEX MICROSCOPIC   Imaging Review No results found.  EKG Interpretation   None       MDM   1. Urinary retention   2. Hematuria    Patient seems to be draining well this time.  The patient required what sounds like a scope to place his last catheter.  He is not very interested in irrigation in the emergency department.  Discharge home with urology followup tomorrow or sooner as needed    Hoy Morn, MD 02/25/13 [phone]

## 2013-03-02 ENCOUNTER — Other Ambulatory Visit: Payer: Self-pay | Admitting: Urology

## 2013-03-02 ENCOUNTER — Encounter (HOSPITAL_COMMUNITY): Payer: Self-pay | Admitting: *Deleted

## 2013-03-02 ENCOUNTER — Inpatient Hospital Stay (HOSPITAL_COMMUNITY)
Admission: AD | Admit: 2013-03-02 | Discharge: 2013-03-12 | DRG: 669 | Disposition: A | Discharge status: Home / Self Care | Payer: BC Managed Care – PPO | Source: Ambulatory Visit | Attending: Internal Medicine | Admitting: Internal Medicine

## 2013-03-02 DIAGNOSIS — E538 Deficiency of other specified B group vitamins: Secondary | ICD-10-CM | POA: Diagnosis present

## 2013-03-02 DIAGNOSIS — N390 Urinary tract infection, site not specified: Secondary | ICD-10-CM | POA: Diagnosis present

## 2013-03-02 DIAGNOSIS — Z923 Personal history of irradiation: Secondary | ICD-10-CM

## 2013-03-02 DIAGNOSIS — I498 Other specified cardiac arrhythmias: Secondary | ICD-10-CM | POA: Diagnosis not present

## 2013-03-02 DIAGNOSIS — Z6841 Body Mass Index (BMI) 40.0 and over, adult: Secondary | ICD-10-CM

## 2013-03-02 DIAGNOSIS — N183 Chronic kidney disease, stage 3 unspecified: Secondary | ICD-10-CM | POA: Diagnosis present

## 2013-03-02 DIAGNOSIS — K5900 Constipation, unspecified: Secondary | ICD-10-CM | POA: Diagnosis not present

## 2013-03-02 DIAGNOSIS — E119 Type 2 diabetes mellitus without complications: Secondary | ICD-10-CM | POA: Diagnosis present

## 2013-03-02 DIAGNOSIS — Z905 Acquired absence of kidney: Secondary | ICD-10-CM

## 2013-03-02 DIAGNOSIS — D509 Iron deficiency anemia, unspecified: Secondary | ICD-10-CM | POA: Diagnosis present

## 2013-03-02 DIAGNOSIS — Z794 Long term (current) use of insulin: Secondary | ICD-10-CM

## 2013-03-02 DIAGNOSIS — R600 Localized edema: Secondary | ICD-10-CM

## 2013-03-02 DIAGNOSIS — N179 Acute kidney failure, unspecified: Secondary | ICD-10-CM | POA: Diagnosis not present

## 2013-03-02 DIAGNOSIS — R102 Pelvic and perineal pain: Secondary | ICD-10-CM

## 2013-03-02 DIAGNOSIS — I82509 Chronic embolism and thrombosis of unspecified deep veins of unspecified lower extremity: Secondary | ICD-10-CM | POA: Diagnosis present

## 2013-03-02 DIAGNOSIS — E785 Hyperlipidemia, unspecified: Secondary | ICD-10-CM | POA: Diagnosis present

## 2013-03-02 DIAGNOSIS — Z01818 Encounter for other preprocedural examination: Secondary | ICD-10-CM

## 2013-03-02 DIAGNOSIS — Z79899 Other long term (current) drug therapy: Secondary | ICD-10-CM

## 2013-03-02 DIAGNOSIS — Z833 Family history of diabetes mellitus: Secondary | ICD-10-CM

## 2013-03-02 DIAGNOSIS — R339 Retention of urine, unspecified: Secondary | ICD-10-CM | POA: Diagnosis present

## 2013-03-02 DIAGNOSIS — I10 Essential (primary) hypertension: Secondary | ICD-10-CM | POA: Diagnosis present

## 2013-03-02 DIAGNOSIS — R310 Gross hematuria: Secondary | ICD-10-CM | POA: Diagnosis present

## 2013-03-02 DIAGNOSIS — Z8546 Personal history of malignant neoplasm of prostate: Secondary | ICD-10-CM

## 2013-03-02 DIAGNOSIS — I129 Hypertensive chronic kidney disease with stage 1 through stage 4 chronic kidney disease, or unspecified chronic kidney disease: Secondary | ICD-10-CM | POA: Diagnosis present

## 2013-03-02 DIAGNOSIS — N189 Chronic kidney disease, unspecified: Secondary | ICD-10-CM

## 2013-03-02 DIAGNOSIS — I440 Atrioventricular block, first degree: Secondary | ICD-10-CM

## 2013-03-02 DIAGNOSIS — I2782 Chronic pulmonary embolism: Secondary | ICD-10-CM | POA: Diagnosis present

## 2013-03-02 DIAGNOSIS — N3289 Other specified disorders of bladder: Principal | ICD-10-CM | POA: Diagnosis present

## 2013-03-02 DIAGNOSIS — G4733 Obstructive sleep apnea (adult) (pediatric): Secondary | ICD-10-CM

## 2013-03-02 DIAGNOSIS — E860 Dehydration: Secondary | ICD-10-CM | POA: Diagnosis present

## 2013-03-02 DIAGNOSIS — Z7901 Long term (current) use of anticoagulants: Secondary | ICD-10-CM

## 2013-03-02 DIAGNOSIS — D62 Acute posthemorrhagic anemia: Secondary | ICD-10-CM | POA: Diagnosis present

## 2013-03-02 DIAGNOSIS — Z87891 Personal history of nicotine dependence: Secondary | ICD-10-CM

## 2013-03-02 DIAGNOSIS — B965 Pseudomonas (aeruginosa) (mallei) (pseudomallei) as the cause of diseases classified elsewhere: Secondary | ICD-10-CM | POA: Diagnosis present

## 2013-03-02 DIAGNOSIS — I872 Venous insufficiency (chronic) (peripheral): Secondary | ICD-10-CM

## 2013-03-02 DIAGNOSIS — I959 Hypotension, unspecified: Secondary | ICD-10-CM

## 2013-03-02 DIAGNOSIS — Z7982 Long term (current) use of aspirin: Secondary | ICD-10-CM

## 2013-03-02 DIAGNOSIS — Z801 Family history of malignant neoplasm of trachea, bronchus and lung: Secondary | ICD-10-CM

## 2013-03-02 DIAGNOSIS — Z86711 Personal history of pulmonary embolism: Secondary | ICD-10-CM | POA: Diagnosis present

## 2013-03-02 DIAGNOSIS — R001 Bradycardia, unspecified: Secondary | ICD-10-CM

## 2013-03-02 DIAGNOSIS — Z85528 Personal history of other malignant neoplasm of kidney: Secondary | ICD-10-CM

## 2013-03-02 HISTORY — DX: Chronic kidney disease, stage 3 unspecified: N18.30

## 2013-03-02 HISTORY — DX: Hyperlipidemia, unspecified: E78.5

## 2013-03-02 HISTORY — DX: Chronic kidney disease, stage 3 (moderate): N18.3

## 2013-03-02 HISTORY — DX: Essential (primary) hypertension: I10

## 2013-03-02 LAB — GLUCOSE, CAPILLARY
Glucose-Capillary: 87 mg/dL (ref 70–99)
Glucose-Capillary: 143 mg/dL — ABNORMAL HIGH (ref 70–99)

## 2013-03-02 MED ORDER — NIACIN ER 500 MG PO CPCR
1000.0000 mg | ORAL_CAPSULE | Freq: Every day | ORAL | Status: DC
  Administered 2013-03-02 – 2013-03-11 (×10): 1000 mg via ORAL
  Filled 2013-03-02 (×12): qty 2

## 2013-03-02 MED ORDER — WARFARIN SODIUM 5 MG PO TABS: 5.0000 mg | ORAL_TABLET | Freq: Every day | ORAL | Status: DC

## 2013-03-02 MED ORDER — LISINOPRIL 40 MG PO TABS
40.0000 mg | ORAL_TABLET | Freq: Two times a day (BID) | ORAL | Status: DC
  Administered 2013-03-02 – 2013-03-05 (×6): 40 mg via ORAL
  Filled 2013-03-02 (×8): qty 1

## 2013-03-02 MED ORDER — INSULIN ASPART 100 UNIT/ML ~~LOC~~ SOLN
0.0000 [IU] | Freq: Three times a day (TID) | SUBCUTANEOUS | Status: DC
  Administered 2013-03-03: 4 [IU] via SUBCUTANEOUS
  Administered 2013-03-04 – 2013-03-06 (×6): 3 [IU] via SUBCUTANEOUS
  Administered 2013-03-08: 4 [IU] via SUBCUTANEOUS
  Administered 2013-03-09: 3 [IU] via SUBCUTANEOUS
  Administered 2013-03-09: 4 [IU] via SUBCUTANEOUS
  Administered 2013-03-10: 3 [IU] via SUBCUTANEOUS
  Administered 2013-03-10: 4 [IU] via SUBCUTANEOUS
  Administered 2013-03-11 (×2): 3 [IU] via SUBCUTANEOUS
  Administered 2013-03-11: 4 [IU] via SUBCUTANEOUS
  Administered 2013-03-12: 3 [IU] via SUBCUTANEOUS

## 2013-03-02 MED ORDER — CARVEDILOL 12.5 MG PO TABS
12.5000 mg | ORAL_TABLET | Freq: Two times a day (BID) | ORAL | Status: DC
  Administered 2013-03-02 – 2013-03-05 (×6): 12.5 mg via ORAL
  Filled 2013-03-02 (×10): qty 1

## 2013-03-02 MED ORDER — QUINAPRIL HCL 10 MG PO TABS: 40.0000 mg | ORAL_TABLET | Freq: Two times a day (BID) | ORAL | Status: DC

## 2013-03-02 MED ORDER — FUROSEMIDE 40 MG PO TABS
40.0000 mg | ORAL_TABLET | Freq: Two times a day (BID) | ORAL | Status: DC
  Administered 2013-03-02 – 2013-03-04 (×4): 40 mg via ORAL
  Filled 2013-03-02 (×6): qty 1

## 2013-03-02 MED ORDER — INSULIN ASPART 100 UNIT/ML ~~LOC~~ SOLN
0.0000 [IU] | Freq: Every day | SUBCUTANEOUS | Status: DC
  Administered 2013-03-04: 4 [IU] via SUBCUTANEOUS

## 2013-03-02 MED ORDER — FENOFIBRATE 160 MG PO TABS
160.0000 mg | ORAL_TABLET | Freq: Every day | ORAL | Status: DC
  Administered 2013-03-03 – 2013-03-06 (×4): 160 mg via ORAL
  Filled 2013-03-02 (×4): qty 1

## 2013-03-02 MED ORDER — GLUCOSAMINE-CHONDROITIN 500-400 MG PO TABS: 1500.0000 | ORAL_TABLET | Freq: Two times a day (BID) | ORAL | Status: DC

## 2013-03-02 MED ORDER — ASPIRIN EC 81 MG PO TBEC
81.0000 mg | DELAYED_RELEASE_TABLET | Freq: Every day | ORAL | Status: DC
  Administered 2013-03-02 – 2013-03-05 (×4): 81 mg via ORAL
  Filled 2013-03-02 (×6): qty 1

## 2013-03-02 MED ORDER — POTASSIUM CHLORIDE CRYS ER 10 MEQ PO TBCR
10.0000 meq | EXTENDED_RELEASE_TABLET | Freq: Two times a day (BID) | ORAL | Status: DC
  Administered 2013-03-02 – 2013-03-04 (×4): 10 meq via ORAL
  Filled 2013-03-02 (×6): qty 1

## 2013-03-02 MED ORDER — SODIUM CHLORIDE 0.9 % IV SOLN
INTRAVENOUS | Status: DC
  Administered 2013-03-02: 17:00:00 via INTRAVENOUS
  Administered 2013-03-03: 125 mL/h via INTRAVENOUS
  Administered 2013-03-04 – 2013-03-06 (×4): via INTRAVENOUS
  Administered 2013-03-06: 125 mL/h via INTRAVENOUS
  Administered 2013-03-07 (×2): via INTRAVENOUS
  Administered 2013-03-08: 10 mL/h via INTRAVENOUS
  Administered 2013-03-10: 22:00:00 via INTRAVENOUS

## 2013-03-02 MED ORDER — TAMSULOSIN HCL 0.4 MG PO CAPS
0.4000 mg | ORAL_CAPSULE | Freq: Every day | ORAL | Status: DC
  Administered 2013-03-03 – 2013-03-04 (×2): 0.4 mg via ORAL
  Filled 2013-03-02 (×2): qty 1

## 2013-03-02 MED ORDER — INSULIN ASPART PROT & ASPART (70-30 MIX) 100 UNIT/ML ~~LOC~~ SUSP: 70.0000 [IU] | Freq: Two times a day (BID) | SUBCUTANEOUS | Status: DC

## 2013-03-02 NOTE — Progress Notes (Signed)
PHARMACIST - PHYSICIAN ORDER COMMUNICATION  CONCERNING: P&T Medication Policy on Herbal Medications  DESCRIPTION:  This patient's order for: glucosamine-chondroitin  has been noted.  This product(s) is classified as an "herbal" or natural product. Due to a lack of definitive safety studies or FDA approval, nonstandard manufacturing practices, plus the potential risk of unknown drug-drug interactions while on inpatient medications, the Pharmacy and Therapeutics Committee does not permit the use of "herbal" or natural products of this type within [facility].   ACTION TAKEN: The pharmacy department is unable to verify this order at this time and your patient has been informed of this safety policy. Please reevaluate patient's clinical condition at discharge and address if the herbal or natural product(s) should be resumed at that time.  Hershal Coria, PharmD, BCPS Pager: [phone] 03/02/2013 4:23 PM

## 2013-03-02 NOTE — H&P (Signed)
[name] is a 73 yr old male patient of Dr Delton Coombes w/ the following GU hx:  1) Prostate cancer: 4+4=8, Neoadjuvant/adj ADT w/ IMRT 01/2010 (Dr. Valere Dross). Final planned ADT 12/2010 (Lupron 6 month)  LastADT: 12/2010 (6 month inj)  2) Enlarged prostate: 153cc TRUS 2011. LUTS: urgency, weak stream. Currently: not interested in treatment.  3) Phimosis: 11/2012- dorsal slit in clinic  4) Renal cell carcinoma: left nephrectomy 1999, grade 3, pT2. Surveillance: CT 2006 & 2009 negative.  5) Balanitis: DX on Feb 16, 2013. Improved w/ antibiotic ointment.  6) Gross hematuria timeline:  Feb 16, 2013: First episode of gross hematuria, no clots. 16 Fr foley catheter placed. Patient to return for repeat cystoscopy since unable to visualize bladder due to gross hematuria.  Feb 19, 2013: 20 Fr Foley catheter placed after cystoscopy due to clots. Unable to visualize bladder due to gross hematuria and clots. Patient to return in 1 week for Foley catheter removal.  Feb 25, 2013: Patient contacted the answering service due to clot retention. He was seen at [facility] ER and larger Foley catheter placed and had bladder irrigation done.  Feb 27, 2013: Follow up in clinic. From cysto finding: erythematous bladder. Bladder irrigation done, able to clear hematuria to light pink urine at time of leaving clinic. 5 alpha reductase inhibitors were discussed to decrease bleeding prostate. Scheduled for cystoscopy, bladder biopsy, right retrograde pyelogram, and clot evacuation but pending med clearance from cardiologist. CT hematuria ordered for Mar 02, 2013.  He is on chronic warfarin 7.5mg  5 days a week and alternating 5mg  the rest of the week for hx of PE. Also on ASA 81mg  daily.  PMH is significant for diabetes type 2  Interval hx:  [name] returns today due to recurrence of clot retention that noted this morning after he wakes up. His urine had been light pink w/o clots over the weekend. There was no  urine in catheter bag at time of arrival today and pt c/o intense bladder pain. Manual irrigation was done and urine was pink at time of departure today but he returned to clinic within an hour after leaving the clinic due to recurrence of clots. Large amount of clots noted after catheter was changed from 20 Fr to 22 Fr 3-way and more manual irrigation was done. Urine is currently light pink, no clots but due to recurrence of clots, he will be admitted for CBI. Denies fever/chills/nausea/vomiting.  He had a CT hematuria protocol done today.  His scrotum is also noted to be edematous from dependent edema, non -tender to touch. Urine culture on Feb 16, 2013: normal, no growth.  Past Medical History Problems  1. History of hypertension (V12.59) 2. History of sleep apnea (V13.89) 3. History of type 2 diabetes mellitus (V12.29) 4. History of Malignant Melanoma Of The Skin (V10.82) 5. Personal history of prostate cancer (V10.46) 6. History of Pulmonary Embolism (V12.51) 7. History of Renal Cell Carcinoma (V10.52)  Surgical History Problems  1. History of Cholecystectomy 2. History of Dermatological Surgery 3. History of Foot Surgery 4. History of Knee Surgery 5. History of Lung Surgery 6. History of Radiation Therapy 7. History of Radical Nephrectomy 8. History of Shoulder Surgery  Current Meds 1. Alendronate Sodium 70 MG Oral Tablet; 1 TABLET ONCE A WEEK IN  THE AM WITH A  GLASS OF   WATER, 30 MIN. BEFORE A  MEAL. REMAIN UPRIGHT;  Therapy: [id number] to (Evaluate:02Jun2015)  Requested for: 40JWJ1914; Last  Rx:16Dec2014 Ordered 2.  Aspirin Low Strength 81 MG Oral Tablet Chewable;  Therapy: (Recorded:04Dec2007) to Recorded 3. Betamethasone Dipropionate 0.05 % External Cream; APPLY SPARINGLY TO AFFECTED  AREA(S) TWICE DAILY;  Therapy: 30Aug2013 to (Evaluate:16Oct2014)  Requested for: 09Oct2014; Last  Rx:09Oct2014 Ordered 4. Calcium Citrate TABS; 315mg  bid;  Therapy: (Recorded:20Mar2009) to  Recorded 5. Carvedilol 12.5 MG Oral Tablet;  Therapy: 16XWR6045 to Recorded 6. Central-Vite TABS; TAKE 1 TABLET DAILY;  Therapy: (Recorded:20Mar2009) to Recorded 7. Cephalexin 500 MG Oral Capsule; TAKE 1 CAPSULE 4 times daily;  Therapy: 40JWJ1914 to (NWGNFAOZ:[id number])  Requested for: 12Jan2015; Last  Rx:12Jan2015 Ordered 8. Finasteride 5 MG Oral Tablet; Take 1 tablet daily;  Therapy: 96EXB2841 to (Evaluate:18Jan2016)  Requested for: 23Jan2015; Last  Rx:23Jan2015 Ordered 9. Fish Oil CAPS; 1200mg ;  Therapy: (Recorded:20Mar2009) to Recorded 10. Glucosamine Chondroitin TABS;   Therapy: (Recorded:04Dec2007) to Recorded 11. Hydrocodone-Acetaminophen 5-325 MG Oral Tablet; Take 1-2 tablets every 4 hours as   needed for pain;   Therapy: [phone] to (Last Rx:17Oct2014) Ordered 12. Klor-Con M10 10 MEQ Oral Tablet Extended Release;   Therapy: 25DGU4403 to Recorded 13. Metoprolol Tartrate 25 MG Oral Tablet; TAKE 2 TABLET Daily;   Therapy: (Recorded:20Mar2009) to Recorded 14. Niaspan 1000 MG Oral Tablet Extended Release;   Therapy: 47QQV9563 to Recorded 15. NovoLOG Mix 70/30 SUSP;   Therapy: (Recorded:20Mar2009) to Recorded 16. Oxycodone-Acetaminophen 5-325 MG Oral Tablet; TAKE 1 TO 2 TABLETS EVERY 4   HOURS AS NEEDED FOR PAIN;   Therapy: 23Jan2015 to (Evaluate:26Jan2015); Last Rx:23Jan2015 Ordered 17. Quinapril HCl - 40 MG Oral Tablet; Take 1 tablet daily;   Therapy: (Recorded:20Mar2009) to Recorded 18. Tamsulosin HCl - 0.4 MG Oral Capsule; TAKE 1 CAPSULE Bedtime;   Therapy: [phone] to (JJOAC:[id number])  Requested for: [phone]; Last   Rx:09Jun2014 Ordered 19. Tamsulosin HCl - 0.4 MG Oral Capsule; Take 1 capsule twice daily;   Therapy: 73UKG2542 to (Evaluate:14Dec2015)  Requested for: 70WCB7628; Last   Rx:19Dec2014 Ordered 20. Tricor 145 MG Oral Tablet; Take 1 tablet daily;   Therapy: (Recorded:20Mar2009) to Recorded 21. Warfarin Sodium 7.5 MG Oral Tablet; 7.5mg  daily except 10mg  on  Thurday & Friday;   Therapy: (Recorded:20Mar2009) to Recorded  Allergies Medication  1. No Known Drug Allergies  Family History Problems  1. Family history of Family Health Status Number Of Children   4 sons 2. Family history of Lung Cancer (V16.1) : Father 3. Family history of Lung Cancer (V16.1) : Mother  Social History Problems  1. Alcohol Use 2. Denied: History of Caffeine Use 3. Marital History - Currently Married 4. Never A Smoker 5. Occupation:   Pharmacologist Rep  Review of Systems  Genitourinary: hematuria and suprapubic pain, but no urinary frequency, no feelings of urinary urgency and no dysuria.    Physical Exam Constitutional: Well nourished and well developed . No acute distress. Obese male.  Pulmonary: No respiratory distress.  Cardiovascular:. The arterial pulses are normal.  Abdomen: The abdomen is soft and nontender.  Genitourinary: Examination of the penis demonstrates an indwelling catheter. The penis is circumcised. The scrotum is edematous and erythematous, but demonstrates no ecchymosis and not tender.    Procedure Manual irrigation was done and drained 1 L of dark cranberry color urine w/ large amount of clots. Urine was pink color when he left the clinic. He was nervous about recurrence of clots and so therefore sat in the waiting area for 1 hour just to make sure his urine continues to flow. No urine was noted in bag after an hour and patient started to  feel intense bladder spasm w/ several pieces of 3cm clots seeping around foley catheter insertion site. Unable to perform manual irrigation w/ current 20 fr catheter. Therefore, this catheter was removed and 22Fr coude tip 3-way Foley catheter was inserted w/o problem. Drained 1 L of dark cranberry color urine w/ large amount of clots.     Assessment Assessed  1. Gross hematuria (599.71)  Gross hematuria w/ clots not resolved after one attempts of manual irrigation w/ 20 Fr. Urine currently light pink  without clots after insertion of 22 fr 3-way foley catheter today.   Plan Benign prostatic hypertrophy without lower urinary tract symptoms, Weak urinary stream  1. PVR U/S; Status:Complete;   [id number]  Discussion/Summary Discussed patient's unresolved gross hematuria today w/ Dr. Jasmine December. Will admit patient for CBI and possible OR for cysto/RPG/clot evacuation if hematuria not resolved w/ CBI. Patient last drank 3 cups of water and 1 cup of sweet tea at noon today. He is a diabetic so he had to eat some hard candy at noon today since he had not eaten any food since last night.

## 2013-03-02 NOTE — H&P (Signed)
ATTENDING ADDENDUM:  I have reviewed [name]'s note. I have repeated the history and physical. I agree with her assessment and plan except for as follows:  PE: Filed Vitals:   03/02/13 1551  BP: 147/66  Pulse: 56  Temp: 98.5 F (36.9 C)  Resp: 20   Neuro: motor and sensory grossly intact in BLE/BUE. GU: foley catheter in place. Draining pink urine on continuous bladder irrigation. HEENT: EOMI, MMM Pulm: CTA-B, no crackles Neck: No mass, No JVD   Assessment: Gross hematuria. -Hold warfarin tonight; continue ASA 81 mg. -Continue CBI; hand irrigate PRN. -Add sliding scale insuline. -Start diabetic diet tonight; NPO at 02:00. -Plan for cards consult in AM and try for OR for cystoscopy, bladder biopsy, and bladder fulguration this Wednesday. -AM labs: CBC, BMP, INR.

## 2013-03-02 NOTE — Progress Notes (Signed)
Set. Pt. Up on CPAP auto titrate (Min: 5, Max: 15) via FFM (what pt. Wears at home) Pt. Has taken CPAP off at this time & states that he will put CPAP back on before going to bed. Pt. Demonstrated understanding of turing CPAP on/off & placing himself on.

## 2013-03-03 DIAGNOSIS — I10 Essential (primary) hypertension: Secondary | ICD-10-CM

## 2013-03-03 DIAGNOSIS — Z86711 Personal history of pulmonary embolism: Secondary | ICD-10-CM

## 2013-03-03 DIAGNOSIS — G4733 Obstructive sleep apnea (adult) (pediatric): Secondary | ICD-10-CM

## 2013-03-03 DIAGNOSIS — Z7901 Long term (current) use of anticoagulants: Secondary | ICD-10-CM

## 2013-03-03 DIAGNOSIS — R310 Gross hematuria: Secondary | ICD-10-CM

## 2013-03-03 DIAGNOSIS — Z01818 Encounter for other preprocedural examination: Secondary | ICD-10-CM

## 2013-03-03 LAB — PROTIME-INR
INR: 3.04 — ABNORMAL HIGH (ref 0.00–1.49)
PROTHROMBIN TIME: 30.4 s — AB (ref 11.6–15.2)

## 2013-03-03 LAB — CBC
HCT: 29.6 % — ABNORMAL LOW (ref 39.0–52.0)
Hemoglobin: 9.1 g/dL — ABNORMAL LOW (ref 13.0–17.0)
MCH: 23.2 pg — ABNORMAL LOW (ref 26.0–34.0)
MCHC: 30.7 g/dL (ref 30.0–36.0)
MCV: 75.3 fL — ABNORMAL LOW (ref 78.0–100.0)
Platelets: 331 10*3/uL (ref 150–400)
RBC: 3.93 MIL/uL — AB (ref 4.22–5.81)
RDW: 17 % — AB (ref 11.5–15.5)
WBC: 6 10*3/uL (ref 4.0–10.5)

## 2013-03-03 LAB — BASIC METABOLIC PANEL
BUN: 21 mg/dL (ref 6–23)
CALCIUM: 8.3 mg/dL — AB (ref 8.4–10.5)
CO2: 24 meq/L (ref 19–32)
Chloride: 104 mEq/L (ref 96–112)
Creatinine, Ser: 1.53 mg/dL — ABNORMAL HIGH (ref 0.50–1.35)
GFR calc non Af Amer: 44 mL/min — ABNORMAL LOW (ref >90)
GFR, EST AFRICAN AMERICAN: 51 mL/min — AB (ref >90)
Glucose, Bld: 116 mg/dL — ABNORMAL HIGH (ref 70–99)
Potassium: 4.3 mEq/L (ref 3.7–5.3)
SODIUM: 141 meq/L (ref 137–147)

## 2013-03-03 LAB — SURGICAL PCR SCREEN
MRSA, PCR: NEGATIVE
Staphylococcus aureus: NEGATIVE

## 2013-03-03 LAB — GLUCOSE, CAPILLARY
Glucose-Capillary: 105 mg/dL — ABNORMAL HIGH (ref 70–99)
Glucose-Capillary: 155 mg/dL — ABNORMAL HIGH (ref 70–99)

## 2013-03-03 MED ORDER — DEXTROSE 5 % IV SOLN
3.0000 g | INTRAVENOUS | Status: AC
  Administered 2013-03-04: 3 g via INTRAVENOUS
  Filled 2013-03-03 (×2): qty 3000

## 2013-03-03 NOTE — Progress Notes (Signed)
PATIENT'S FOLEY CLOTTED, FINALLY WITH LOTS OF IRRIGATION, ITS DRAINING AGAIN, DID NOT HAVE TO REPLACE CATHETER, Pamella Samons, RN

## 2013-03-03 NOTE — Progress Notes (Signed)
Pt's foley has been hand irrigated multiple times throughout the shift, and large stringy blood clots have been removed each time. Foley tubing and bag has also needed to be replaced multiple times today due to the amount of blood clots in foley tubing and drainage bag. Will continue to closely monitor pt's output, and irrigate foley as necessary.

## 2013-03-03 NOTE — Progress Notes (Signed)
Urology Progress Note  Subjective:     No acute urologic events overnight. Required hand irrigation for clogged catheter. Running pink on CBI now.  ROS: Negative chest pain or SOB.  Objective:  Patient Vitals for the past 24 hrs:  BP Temp Temp src Pulse Resp SpO2 Height Weight  03/03/13 0644 137/63 mmHg 98.5 F (36.9 C) Axillary 54 20 96 % - -  03/02/13 2037 154/70 mmHg 98.5 F (36.9 C) Oral 64 20 98 % - -  03/02/13 1900 - - - - - - 5\' 10"  (1.778 m) 152.2 kg (335 lb 8.6 oz)  03/02/13 1551 147/66 mmHg 98.5 F (36.9 C) Oral 56 20 100 % 5\' 10"  (1.778 m) -    Physical Exam: General:  No acute distress, awake Cardiovascular:    [x]   S1/S2 present, RRR  []   Irregularly irregular Chest:  CTA-B Abdomen:               []  Soft, appropriately TTP  [x]  Soft, NTTP  []  Soft, appropriately TTP, incision(s) clean/dry/intact  Genitourinary: Dorsal slit, negative foreskin erythema. Foley:  Draining pink urine. On continuous bladder irrigation.    I/O last 3 completed shifts: In: 21890 [P.O.:240; ZMOQH:47654] Out: 25700 [Urine:25700]  Recent Labs     03/03/13  0435  HGB  9.1*  WBC  6.0  PLT  331    Recent Labs     03/03/13  0435  NA  141  K  4.3  CL  104  CO2  24  BUN  21  CREATININE  1.53*  CALCIUM  8.3*  GFRNONAA  44*  GFRAA  51*     Recent Labs     03/03/13  0435  INR  3.04*     No components found with this basename: ABG,     Length of stay: 1 days.  Assessment: Gross hematuria. Anticoagulated.    Plan: -Diabetic diet today; NPO at 02:00 tomorrow for surgery. -Hold warfarin again tonight. Recheck labs in AM. -Cardiology consult for clearance tomorrow. -Planned surgery is cystoscopy, clot evacuation, bladder/urethral biopsy, possible bilateral retrograde pyelograms.   Rolan Bucco, MD [phone]

## 2013-03-03 NOTE — Consult Note (Signed)
Admit date: 03/02/2013 Referring Physician  Dr. Jasmine December Primary Cardiologist  Dr. Shelva Majestic Reason for Consultation  Preoperative clearance  HPI: [name] is a 73 year old with a complex medical history including severe exogenous obesity, venous insufficiency, history of prostate cancer and he is also status post left nephrectomy. He has had difficulty with hypertension and also has a history of pulmonary embolism for which he is anticoatulated.   He also said significant problems with lower extremity edema and has documented venous insufficiency of both his right and left superficial saphenous veins. He has a remote history of DVT/PE and has been on anticoagulation therapy with Coumadin. In 2009 he was referred for a sleep study which revealed severe obstructive sleep apnea with an AHI on the baseline portion of a split night protocol in excess of 100. Since 2009 he has been using CPAP therapy and has been on a 14 cm water pressure. He had a nuclear stress test in May 2011 which was reportedly nonischemic. His last echo study was 12/16/2012 with ejection fraction was 55-60%. There was mild mitral regurgitation, aortic sclerosis without stenosis. Estimated RV systolic pressure was normal at 23 mm.  He has a history of prostate CA and renal cell CA s/p left nephrectomy. He is now admitted with gross hematuria and Cardiology is asked to consult for preoperative clearance for cystoscopy, bladder biopsy and bladder fulguration.      PMH:   Past Medical History  Diagnosis Date  . Obesity   . Phlebitis     Lower extermity  . Pulmonary emboli 2008  . Prostate cancer 07/2009  . Sleep apnea     on CPAP  . Hx of echocardiogram 12/04/2010    Normal EF >55% no significant valve disease  . History of stress test 06/27/2009    Low risk and EF of approximately 50%  . DVT (deep venous thrombosis)   . Diabetes mellitus      PSH:   Past Surgical History  Procedure Laterality Date  .  Nephrectomy  1999    for CA  . Cholecystectomy  1999    Allergies:  Review of patient's allergies indicates no known allergies. Prior to Admit Meds:   Prescriptions prior to admission  Medication Sig Dispense Refill  . alendronate (FOSAMAX) 70 MG tablet Take 1 tablet by mouth every Monday.       Marland Kitchen aspirin 81 MG tablet Take 81 mg by mouth at bedtime.       . carvedilol (COREG) 12.5 MG tablet Take 12.5 mg by mouth 2 (two) times daily with a meal.      . fenofibrate (TRICOR) 145 MG tablet Take 145 mg by mouth daily.       . ferrous sulfate 325 (65 FE) MG EC tablet Take 325 mg by mouth daily.      . finasteride (PROSCAR) 5 MG tablet Take 5 mg by mouth every evening.      . fish oil-omega-3 fatty acids 1000 MG capsule Take 1 g by mouth 2 (two) times daily.       . furosemide (LASIX) 40 MG tablet Take 40 mg by mouth daily.      Marland Kitchen glucosamine-chondroitin 500-400 MG tablet Take 1,500 tablets by mouth 2 (two) times daily.      . Multiple Vitamin (MULTIVITAMIN WITH MINERALS) TABS tablet Take 1 tablet by mouth daily.      . naproxen sodium (ANAPROX) 220 MG tablet Take 220 mg by mouth 2 (two) times daily  with a meal.      . Niacin CR 1000 MG TBCR Take 1,000 mg by mouth at bedtime.       Marland Kitchen NOVOLOG MIX 70/30 (70-30) 100 UNIT/ML injection Inject 70 Units into the skin 2 (two) times daily.       . potassium chloride (K-DUR,KLOR-CON) 10 MEQ tablet Take 10 mEq by mouth daily.      . quinapril (ACCUPRIL) 40 MG tablet Take 40 mg by mouth 2 (two) times daily.       . tamsulosin (FLOMAX) 0.4 MG CAPS capsule Take 0.4 mg by mouth 2 (two) times daily.      Marland Kitchen warfarin (COUMADIN) 5 MG tablet Take 5-7.5 mg by mouth daily. 5 mg on Monday and Thursday and 7.5 mg all other days      . tamsulosin (FLOMAX) 0.4 MG CAPS capsule Take 0.4 mg by mouth daily.        Fam HX:    Family History  Problem Relation Age of Onset  . Cancer Mother   . Diabetes Father   . Cancer Father    Social HX:    History   Social History    . Marital Status: Married    Spouse Name: N/A    Number of Children: N/A  . Years of Education: N/A   Occupational History  . Not on file.   Social History Main Topics  . Smoking status: Former Smoker    Quit date: 12/05/1947  . Smokeless tobacco: Never Used  . Alcohol Use: Yes     Comment: occasional beer  . Drug Use: No  . Sexual Activity: Not on file   Other Topics Concern  . Not on file   Social History Narrative  . No narrative on file     ROS:  All 11 ROS were addressed and are negative except what is stated in the HPI  Physical Exam: Blood pressure 137/63, pulse 54, temperature 98.5 F (36.9 C), temperature source Axillary, resp. rate 20, height 5\' 10"  (1.778 m), weight 335 lb 8.6 oz (152.2 kg), SpO2 96.00%.    General: Well developed, well nourished, in no acute distress Head: Eyes PERRLA, No xanthomas.   Normal cephalic and atramatic  Lungs:   Clear bilaterally to auscultation and percussion. Heart:   HRRR S1 S2 Pulses are 2+ & equal.            No carotid bruit. No JVD.  No abdominal bruits. No femoral bruits. Abdomen: Bowel sounds are positive, abdomen soft and non-tender without masses  Extremities:  1+ edema Neuro: Alert and oriented X 3. Psych:  Good affect, responds appropriately    Labs:   Lab Results  Component Value Date   WBC 6.0 03/03/2013   HGB 9.1* 03/03/2013   HCT 29.6* 03/03/2013   MCV 75.3* 03/03/2013   PLT 331 03/03/2013    Recent Labs Lab 03/03/13 0435  NA 141  K 4.3  CL 104  CO2 24  BUN 21  CREATININE 1.53*  CALCIUM 8.3*  GLUCOSE 116*   No results found for this basename: PTT   Lab Results  Component Value Date   INR 3.04* 03/03/2013   INR 1.47 07/20/2009   INR 1.43 07/19/2009   Lab Results  Component Value Date   CKTOTAL 99 07/17/2009   CKMB 1.8 07/17/2009   TROPONINI  Value: 0.04        NO INDICATION OF MYOCARDIAL INJURY. 07/17/2009     Lab Results  Component Value  Date   CHOL  Value: 110        ATP III  CLASSIFICATION:  <200     mg/dL   Desirable  200-239  mg/dL   Borderline High  >=240    mg/dL   High 01/21/2007   Lab Results  Component Value Date   HDL 21* 01/21/2007   Lab Results  Component Value Date   LDLCALC  Value: 15        Total Cholesterol/HDL:CHD Risk Coronary Heart Disease Risk Table                     Men   Women  1/2 Average Risk   3.4   3.3 01/21/2007   Lab Results  Component Value Date   TRIG 369* 01/21/2007   Lab Results  Component Value Date   CHOLHDL 5.2 01/21/2007   No results found for this basename: LDLDIRECT      Radiology:  No results found.  EKG:  NSR with minimal voltage for LVH and nonspecific ST abnormality  ASSESSMENT/PLAN:  1.  Hematuria needing Cardiac preop clearance for cystoscopy/ bladder bx.  He had normal LVF on an echo in Nov 2014 with moderate LVH and normal nuclear stress test in 2011.  He has not had any exertional chest pain or pressure and no DOE.  He can walk over 4 blocks without any symptoms.  He is low risk from a cardiac standpoint to proceed with cystoscopy.  He should have his CPAP machine available post op given his severe degree of OSA> 2.  HTN - borderline control 3.  Severe OSA on CPAP    Sueanne Margarita, MD  03/03/2013  11:19 AM

## 2013-03-03 NOTE — Care Management Note (Addendum)
    Page 1 of 2   03/12/2013     3:13:10 PM   CARE MANAGEMENT NOTE 03/12/2013  Patient:  [name], [name]   Account Number:  [id number]  Date Initiated:  03/03/2013  Documentation initiated by:  [facility]  Subjective/Objective Assessment:   73 Y/O M ADMITTED W/GROSS HEMATURIA.     Action/Plan:   FROM HOME.HAS PCP,PHARMACY.   Anticipated DC Date:  03/12/2013   Anticipated DC Plan:  [place]  CM consult      Choice offered to / List presented to:  C-1 Patient   DME arranged  Vassie Moselle      DME agency  [place].        Status of service:  Completed, signed off Medicare Important Message given?   (If response is "NO", the following Medicare IM given date fields will be blank) Date Medicare IM given:   Date Additional Medicare IM given:    Discharge Disposition:  HOME/SELF CARE  Per UR Regulation:  Reviewed for med. necessity/level of care/duration of stay  If discussed at [place] of Stay Meetings, dates discussed:   03/10/2013  03/12/2013    Comments:  03/12/13 Vansh Reckart RN,BSN NCM [phone] D/C HOME.HOME RW ALREADY IN [facility].  03/11/13 Hadiyah Maricle RN,BSN NCM [phone] URO FOLLOWING-CARBOPROST WORKING-MAY D/C IN AM IF STABLE.[place] RW.PATIENT ALREADY HAS IT IN RM.  03/10/13 Shequila Neglia RN,BSN NCM [phone] F/C D/C.[place] RW @ D/C.BP ELEVATED-CARDIO FOLLOWING.  03/05/13 Macel Yearsley RN,BSN NCM [phone] PT-NO F/U.RECOMMENDED FOR RW.MD PLEASE PUT IN ORDER FOR HOME RW.NO FURTHER D/C NEEDS.  03/03/13 Joelee Snoke RN,BSN NCM [phone] CARDIO-CLEARED FOR SX.URO FOLLOWING.FOR CYSTOSCOPY IN AM.NO ANTICIPATED D/C NEEDS.

## 2013-03-04 ENCOUNTER — Encounter (HOSPITAL_COMMUNITY): Payer: BC Managed Care – PPO | Admitting: *Deleted

## 2013-03-04 ENCOUNTER — Inpatient Hospital Stay (HOSPITAL_COMMUNITY): Payer: BC Managed Care – PPO | Admitting: *Deleted

## 2013-03-04 ENCOUNTER — Encounter (HOSPITAL_COMMUNITY)
Admission: AD | Disposition: A | Discharge status: Home / Self Care | Payer: Self-pay | Source: Ambulatory Visit | Attending: Internal Medicine

## 2013-03-04 ENCOUNTER — Encounter (HOSPITAL_COMMUNITY): Payer: Self-pay | Admitting: *Deleted

## 2013-03-04 HISTORY — PX: TRANSURETHRAL RESECTION OF BLADDER TUMOR: SHX2575

## 2013-03-04 LAB — PREPARE RBC (CROSSMATCH)

## 2013-03-04 LAB — CBC
HEMATOCRIT: 25.9 % — AB (ref 39.0–52.0)
HEMOGLOBIN: 7.9 g/dL — AB (ref 13.0–17.0)
MCH: 23.2 pg — ABNORMAL LOW (ref 26.0–34.0)
MCHC: 30.5 g/dL (ref 30.0–36.0)
MCV: 76 fL — AB (ref 78.0–100.0)
Platelets: 290 10*3/uL (ref 150–400)
RBC: 3.41 MIL/uL — AB (ref 4.22–5.81)
RDW: 17 % — AB (ref 11.5–15.5)
WBC: 5.8 10*3/uL (ref 4.0–10.5)

## 2013-03-04 LAB — BASIC METABOLIC PANEL
BUN: 19 mg/dL (ref 6–23)
CHLORIDE: 106 meq/L (ref 96–112)
CO2: 25 meq/L (ref 19–32)
Calcium: 8 mg/dL — ABNORMAL LOW (ref 8.4–10.5)
Creatinine, Ser: 1.5 mg/dL — ABNORMAL HIGH (ref 0.50–1.35)
GFR calc non Af Amer: 45 mL/min — ABNORMAL LOW (ref >90)
GFR, EST AFRICAN AMERICAN: 52 mL/min — AB (ref >90)
GLUCOSE: 154 mg/dL — AB (ref 70–99)
Potassium: 4.5 mEq/L (ref 3.7–5.3)
Sodium: 140 mEq/L (ref 137–147)

## 2013-03-04 LAB — GLUCOSE, CAPILLARY
GLUCOSE-CAPILLARY: 161 mg/dL — AB (ref 70–99)
GLUCOSE-CAPILLARY: 145 mg/dL — AB (ref 70–99)
GLUCOSE-CAPILLARY: 160 mg/dL — AB (ref 70–99)
GLUCOSE-CAPILLARY: 155 mg/dL — AB (ref 70–99)

## 2013-03-04 LAB — PROTIME-INR
INR: 2.85 — ABNORMAL HIGH (ref 0.00–1.49)
Prothrombin Time: 28.9 seconds — ABNORMAL HIGH (ref 11.6–15.2)

## 2013-03-04 LAB — ABO/RH: ABO/RH(D): A POS

## 2013-03-04 SURGERY — TURBT (TRANSURETHRAL RESECTION OF BLADDER TUMOR)
Anesthesia: General | Site: Bladder

## 2013-03-04 MED ORDER — LIDOCAINE HCL (CARDIAC) 20 MG/ML IV SOLN
INTRAVENOUS | Status: AC
  Filled 2013-03-04: qty 5

## 2013-03-04 MED ORDER — HYDROMORPHONE HCL PF 1 MG/ML IJ SOLN: 0.2500 mg | INTRAMUSCULAR | Status: DC | PRN

## 2013-03-04 MED ORDER — OXYBUTYNIN CHLORIDE 5 MG PO TABS
5.0000 mg | ORAL_TABLET | Freq: Four times a day (QID) | ORAL | Status: DC | PRN
  Administered 2013-03-04: 5 mg via ORAL
  Filled 2013-03-04: qty 1

## 2013-03-04 MED ORDER — PROPOFOL 10 MG/ML IV BOLUS
INTRAVENOUS | Status: DC | PRN
  Administered 2013-03-04: 250 mg via INTRAVENOUS
  Administered 2013-03-04: 30 mg via INTRAVENOUS

## 2013-03-04 MED ORDER — 0.9 % SODIUM CHLORIDE (POUR BTL) OPTIME
TOPICAL | Status: DC | PRN
  Administered 2013-03-04: 1000 mL

## 2013-03-04 MED ORDER — FUROSEMIDE 40 MG PO TABS
40.0000 mg | ORAL_TABLET | Freq: Every day | ORAL | Status: DC
  Administered 2013-03-05 – 2013-03-06 (×2): 40 mg via ORAL
  Filled 2013-03-04 (×2): qty 1

## 2013-03-04 MED ORDER — WARFARIN - PHYSICIAN DOSING INPATIENT
Freq: Every day | Status: DC
  Administered 2013-03-04: 20:00:00

## 2013-03-04 MED ORDER — SODIUM CHLORIDE 0.9 % IR SOLN
Status: DC | PRN
  Administered 2013-03-04 (×4): 3000 mL
  Administered 2013-03-04: 9000 mL
  Administered 2013-03-04 (×4): 3000 mL

## 2013-03-04 MED ORDER — METHYLENE BLUE 1 % INJ SOLN
INTRAMUSCULAR | Status: AC
  Filled 2013-03-04: qty 10

## 2013-03-04 MED ORDER — MEPERIDINE HCL 50 MG/ML IJ SOLN: 6.2500 mg | INTRAMUSCULAR | Status: DC | PRN

## 2013-03-04 MED ORDER — ONDANSETRON HCL 4 MG/2ML IJ SOLN
INTRAMUSCULAR | Status: AC
  Filled 2013-03-04: qty 2

## 2013-03-04 MED ORDER — METHYLENE BLUE 1 % INJ SOLN
INTRAMUSCULAR | Status: DC | PRN
  Administered 2013-03-04: 5 mL via INTRAVENOUS
  Administered 2013-03-04: 10 mL via INTRAVENOUS
  Administered 2013-03-04: 5 mL via INTRAVENOUS

## 2013-03-04 MED ORDER — LIDOCAINE HCL (CARDIAC) 20 MG/ML IV SOLN
INTRAVENOUS | Status: DC | PRN
  Administered 2013-03-04: 100 mg via INTRAVENOUS

## 2013-03-04 MED ORDER — FINASTERIDE 5 MG PO TABS
5.0000 mg | ORAL_TABLET | Freq: Every evening | ORAL | Status: DC
  Administered 2013-03-04 – 2013-03-11 (×8): 5 mg via ORAL
  Filled 2013-03-04 (×9): qty 1

## 2013-03-04 MED ORDER — TAMSULOSIN HCL 0.4 MG PO CAPS
0.4000 mg | ORAL_CAPSULE | Freq: Two times a day (BID) | ORAL | Status: DC
  Administered 2013-03-04 – 2013-03-12 (×16): 0.4 mg via ORAL
  Filled 2013-03-04 (×18): qty 1

## 2013-03-04 MED ORDER — MIDAZOLAM HCL 2 MG/2ML IJ SOLN
INTRAMUSCULAR | Status: AC
  Filled 2013-03-04: qty 2

## 2013-03-04 MED ORDER — POTASSIUM CHLORIDE CRYS ER 10 MEQ PO TBCR
10.0000 meq | EXTENDED_RELEASE_TABLET | Freq: Every day | ORAL | Status: DC
  Administered 2013-03-05 – 2013-03-06 (×2): 10 meq via ORAL
  Filled 2013-03-04 (×2): qty 1

## 2013-03-04 MED ORDER — LIDOCAINE HCL 2 % EX GEL
CUTANEOUS | Status: AC
  Filled 2013-03-04: qty 10

## 2013-03-04 MED ORDER — PROPOFOL 10 MG/ML IV BOLUS
INTRAVENOUS | Status: AC
  Filled 2013-03-04: qty 20

## 2013-03-04 MED ORDER — SODIUM CHLORIDE 0.9 % IR SOLN
3000.0000 mL | Status: DC
  Administered 2013-03-08 – 2013-03-11 (×2): 3000 mL

## 2013-03-04 MED ORDER — BELLADONNA ALKALOIDS-OPIUM 16.2-60 MG RE SUPP
RECTAL | Status: DC | PRN
  Administered 2013-03-04: 1 via RECTAL

## 2013-03-04 MED ORDER — BELLADONNA ALKALOIDS-OPIUM 16.2-60 MG RE SUPP
RECTAL | Status: AC
  Filled 2013-03-04: qty 1

## 2013-03-04 MED ORDER — FENTANYL CITRATE 0.05 MG/ML IJ SOLN
INTRAMUSCULAR | Status: DC | PRN
  Administered 2013-03-04 (×2): 50 ug via INTRAVENOUS
  Administered 2013-03-04: 100 ug via INTRAVENOUS

## 2013-03-04 MED ORDER — OXYCODONE HCL 5 MG/5ML PO SOLN
5.0000 mg | Freq: Once | ORAL | Status: DC | PRN
  Filled 2013-03-04: qty 5

## 2013-03-04 MED ORDER — IOHEXOL 300 MG/ML  SOLN
INTRAMUSCULAR | Status: DC | PRN
  Administered 2013-03-04: 5 mL

## 2013-03-04 MED ORDER — CISATRACURIUM BESYLATE (PF) 10 MG/5ML IV SOLN
INTRAVENOUS | Status: DC | PRN
  Administered 2013-03-04: 2 mg via INTRAVENOUS
  Administered 2013-03-04: 5 mg via INTRAVENOUS

## 2013-03-04 MED ORDER — WARFARIN SODIUM 5 MG PO TABS
5.0000 mg | ORAL_TABLET | Freq: Every day | ORAL | Status: DC
Start: 2013-03-04 — End: 2013-03-05
  Administered 2013-03-04: 5 mg via ORAL
  Filled 2013-03-04 (×2): qty 1

## 2013-03-04 MED ORDER — SODIUM CHLORIDE 0.9 % IV SOLN
INTRAVENOUS | Status: DC | PRN
  Administered 2013-03-04: 15:00:00 via INTRAVENOUS

## 2013-03-04 MED ORDER — FENTANYL CITRATE 0.05 MG/ML IJ SOLN
INTRAMUSCULAR | Status: AC
  Filled 2013-03-04: qty 2

## 2013-03-04 MED ORDER — NEOSTIGMINE METHYLSULFATE 1 MG/ML IJ SOLN
INTRAMUSCULAR | Status: DC | PRN
  Administered 2013-03-04: 4 mg via INTRAVENOUS

## 2013-03-04 MED ORDER — EPHEDRINE SULFATE 50 MG/ML IJ SOLN
INTRAMUSCULAR | Status: DC | PRN
  Administered 2013-03-04: 5 mg via INTRAVENOUS

## 2013-03-04 MED ORDER — LACTATED RINGERS IV SOLN
INTRAVENOUS | Status: DC
Start: 2013-03-04 — End: 2013-03-04
  Administered 2013-03-04: 16:00:00 via INTRAVENOUS
  Administered 2013-03-04: 1000 mL via INTRAVENOUS

## 2013-03-04 MED ORDER — ONDANSETRON HCL 4 MG/2ML IJ SOLN
INTRAMUSCULAR | Status: DC | PRN
  Administered 2013-03-04: 4 mg via INTRAVENOUS

## 2013-03-04 MED ORDER — GLYCOPYRROLATE 0.2 MG/ML IJ SOLN
INTRAMUSCULAR | Status: DC | PRN
  Administered 2013-03-04: 0.6 mg via INTRAVENOUS

## 2013-03-04 MED ORDER — LIDOCAINE HCL 2 % EX GEL
CUTANEOUS | Status: DC | PRN
  Administered 2013-03-04: 1

## 2013-03-04 MED ORDER — OXYCODONE HCL 5 MG PO TABS: 5.0000 mg | ORAL_TABLET | Freq: Once | ORAL | Status: DC | PRN

## 2013-03-04 MED ORDER — SUCCINYLCHOLINE CHLORIDE 20 MG/ML IJ SOLN
INTRAMUSCULAR | Status: DC | PRN
  Administered 2013-03-04: 100 mg via INTRAVENOUS

## 2013-03-04 MED ORDER — METHYLENE BLUE 1 % INJ SOLN
INTRAMUSCULAR | Status: AC
Start: 2013-03-04 — End: 2013-03-04
  Filled 2013-03-04: qty 10

## 2013-03-04 MED ORDER — PROMETHAZINE HCL 25 MG/ML IJ SOLN: 6.2500 mg | INTRAMUSCULAR | Status: DC | PRN

## 2013-03-04 MED ORDER — MORPHINE SULFATE 2 MG/ML IJ SOLN
1.0000 mg | INTRAMUSCULAR | Status: DC | PRN
  Administered 2013-03-04 – 2013-03-05 (×6): 2 mg via INTRAVENOUS
  Filled 2013-03-04 (×6): qty 1

## 2013-03-04 MED ORDER — FENTANYL CITRATE 0.05 MG/ML IJ SOLN
INTRAMUSCULAR | Status: AC
Start: 2013-03-04 — End: 2013-03-04
  Filled 2013-03-04: qty 2

## 2013-03-04 MED ORDER — CEFAZOLIN SODIUM-DEXTROSE 2-3 GM-% IV SOLR
2.0000 g | Freq: Three times a day (TID) | INTRAVENOUS | Status: AC
  Administered 2013-03-04 – 2013-03-05 (×2): 2 g via INTRAVENOUS
  Filled 2013-03-04 (×2): qty 50

## 2013-03-04 MED ORDER — HYOSCYAMINE SULFATE 0.125 MG PO TBDP
0.1250 mg | ORAL_TABLET | ORAL | Status: DC | PRN
  Administered 2013-03-05 – 2013-03-09 (×2): 0.125 mg via SUBLINGUAL
  Filled 2013-03-04 (×2): qty 1

## 2013-03-04 SURGICAL SUPPLY — 24 items
BAG URINE DRAINAGE (UROLOGICAL SUPPLIES) ×2 IMPLANT
BAG URO CATCHER STRL LF (DRAPE) ×3 IMPLANT
CATH HEMA 3WAY 30CC 24FR COUDE (CATHETERS) ×2 IMPLANT
CATH URET 5FR 28IN OPEN ENDED (CATHETERS) ×2 IMPLANT
DRAPE CAMERA CLOSED 9X96 (DRAPES) ×3 IMPLANT
ELECT LOOP MED HF 24F 12D CBL (CLIP) ×2 IMPLANT
ELECT REM PT RETURN 9FT ADLT (ELECTROSURGICAL) ×3
ELECT RESECT VAPORIZE 12D CBL (ELECTRODE) ×2 IMPLANT
ELECTRODE REM PT RTRN 9FT ADLT (ELECTROSURGICAL) ×1 IMPLANT
EVACUATOR MICROVAS BLADDER (UROLOGICAL SUPPLIES) IMPLANT
GLOVE BIOGEL M 7.0 STRL (GLOVE) IMPLANT
GLOVE BIOGEL PI IND STRL 7.5 (GLOVE) ×2 IMPLANT
GLOVE BIOGEL PI INDICATOR 7.5 (GLOVE) ×4
GLOVE ECLIPSE 7.0 STRL STRAW (GLOVE) ×3 IMPLANT
IV NS IRRIG 3000ML ARTHROMATIC (IV SOLUTION) ×24 IMPLANT
KIT ASPIRATION TUBING (SET/KITS/TRAYS/PACK) ×2 IMPLANT
LOOPS RESECTOSCOPE DISP (ELECTROSURGICAL) ×3 IMPLANT
MANIFOLD NEPTUNE II (INSTRUMENTS) ×3 IMPLANT
PACK CYSTO (CUSTOM PROCEDURE TRAY) ×3 IMPLANT
SCRUB PCMX 4 OZ (MISCELLANEOUS) ×2 IMPLANT
SYRINGE IRR TOOMEY STRL 70CC (SYRINGE) IMPLANT
TUBING CONNECTING 10 (TUBING) ×2 IMPLANT
TUBING CONNECTING 10' (TUBING) ×1
WATER STERILE IRR 500ML POUR (IV SOLUTION) ×2 IMPLANT

## 2013-03-04 NOTE — Anesthesia Preprocedure Evaluation (Addendum)
Anesthesia Evaluation  Patient identified by MRN, date of birth, ID band Patient awake    Reviewed: Allergy & Precautions, H&P , NPO status , Patient's Chart, lab work & pertinent test results  Airway Mallampati: III TM Distance: >3 FB Neck ROM: Full    Dental  (+) Dental Advisory Given   Pulmonary sleep apnea , former smoker,  breath sounds clear to auscultation        Cardiovascular hypertension, Pt. on medications + Peripheral Vascular Disease + Valvular Problems/Murmurs Rhythm:Regular Rate:Normal     Neuro/Psych negative neurological ROS  negative psych ROS   GI/Hepatic negative GI ROS, Neg liver ROS,   Endo/Other  diabetesMorbid obesity  Renal/GU negative Renal ROS     Musculoskeletal negative musculoskeletal ROS (+)   Abdominal   Peds  Hematology negative hematology ROS (+)   Anesthesia Other Findings   Reproductive/Obstetrics negative OB ROS                           Anesthesia Physical Anesthesia Plan  ASA: III  Anesthesia Plan: General   Post-op Pain Management:    Induction: Intravenous  Airway Management Planned: Oral ETT  Additional Equipment:   Intra-op Plan:   Post-operative Plan: Extubation in OR  Informed Consent: I have reviewed the patients History and Physical, chart, labs and discussed the procedure including the risks, benefits and alternatives for the proposed anesthesia with the patient or authorized representative who has indicated his/her understanding and acceptance.   Dental advisory given  Plan Discussed with: CRNA  Anesthesia Plan Comments:        Anesthesia Quick Evaluation

## 2013-03-04 NOTE — Progress Notes (Signed)
Upon arrival patient has self administered CPAP, resting comfortably. RT will assist as needed.  

## 2013-03-04 NOTE — Transfer of Care (Signed)
Immediate Anesthesia Transfer of Care Note  Patient: [name]  Procedure(s) Performed: Procedure(s): CYSTOSCOPY WITH RETROGRADE PYELOGRAM AND BLADDER BIOPSY  (GYRUS) (N/A)  Patient Location: PACU  Anesthesia Type:General  Level of Consciousness: awake, alert  and oriented  Airway & Oxygen Therapy: Patient Spontanous Breathing and Patient connected to face mask oxygen  Post-op Assessment: Report given to PACU RN, Post -op Vital signs reviewed and stable and Patient moving all extremities X 4  Post vital signs: Reviewed and stable  Complications: No apparent anesthesia complications

## 2013-03-04 NOTE — Anesthesia Postprocedure Evaluation (Signed)
Anesthesia Post Note  Patient: [name]  Procedure(s) Performed: Procedure(s) (LRB): CYSTOSCOPY WITH RIGHT RETROGRADE PYELOGRAM AND BLADDER BIOPSY /CLOT EVACUATION/ BIOPSY PROSTATIC URETHRA WITH FULGERATION  (N/A)  Anesthesia type: General  Patient location: PACU  Post pain: Pain level controlled  Post assessment: Post-op Vital signs reviewed  Last Vitals: BP 130/62  Pulse 54  Temp(Src) 36.4 C (Oral)  Resp 18  Ht 5\' 10"  (1.778 m)  Wt 335 lb 8.6 oz (152.2 kg)  BMI 48.14 kg/m2  SpO2 97%  Post vital signs: Reviewed  Level of consciousness: sedated  Complications: No apparent anesthesia complications

## 2013-03-04 NOTE — Anesthesia Procedure Notes (Addendum)
Procedure Name: Intubation Date/Time: 03/04/2013 1:28 PM Performed by: Danley Danker L Patient Re-evaluated:Patient Re-evaluated prior to inductionOxygen Delivery Method: Circle system utilized Preoxygenation: Pre-oxygenation with 100% oxygen Intubation Type: IV induction Ventilation: Mask ventilation without difficulty and Oral airway inserted - appropriate to patient size Laryngoscope Size: Sabra Heck and 2 Grade View: Grade I Tube type: Oral Tube size: 8.0 mm Number of attempts: 1 Airway Equipment and Method: Stylet Placement Confirmation: ETT inserted through vocal cords under direct vision,  positive ETCO2 and breath sounds checked- equal and bilateral Secured at: 22 cm Tube secured with: Tape Dental Injury: Teeth and Oropharynx as per pre-operative assessment

## 2013-03-04 NOTE — Progress Notes (Signed)
Urology Progress Note  Subjective:     No acute urologic events overnight. Required hand irrigation of catheter once since I spoke w/ him last night. Running pink on CBI now.   ROS: Negative chest pain or SOB.  Objective:  Patient Vitals for the past 24 hrs:  BP Temp Temp src Pulse Resp SpO2  03/04/13 0608 133/59 mmHg 99.1 F (37.3 C) Oral 61 18 97 %  03/03/13 2215 138/51 mmHg 98.9 F (37.2 C) Oral 58 18 98 %  03/03/13 2109 - - - 63 19 97 %  03/03/13 1500 159/69 mmHg 98.3 F (36.8 C) Oral 58 20 99 %  03/03/13 0644 137/63 mmHg 98.5 F (36.9 C) Axillary 54 20 96 %    Physical Exam: General:  No acute distress, awake Cardiovascular:    [x]   S1/S2 present, RRR  []   Irregularly irregular Chest:  CTA-B Abdomen:               []  Soft, appropriately TTP  [x]  Soft, NTTP  []  Soft, appropriately TTP, incision(s) clean/dry/intact  Genitourinary: Dorsal slit, negative foreskin erythema. Foley:  Draining pink urine. On continuous bladder irrigation.    I/O last 3 completed shifts: In: 13086 [P.O.:720; [id number]] Out: 51825 [MWUXL:[id number]]  Recent Labs     03/03/13  0435  03/04/13  0505  HGB  9.1*  7.9*  WBC  6.0  5.8  PLT  331  290    Recent Labs     03/03/13  0435  03/04/13  0505  NA  141  140  K  4.3  4.5  CL  104  106  CO2  24  25  BUN  21  19  CREATININE  1.53*  1.50*  CALCIUM  8.3*  8.0*  GFRNONAA  44*  45*  GFRAA  51*  52*     Recent Labs     03/03/13  0435  03/04/13  0505  INR  3.04*  2.85*     No components found with this basename: ABG,     Length of stay: 2 days.  Assessment: Gross hematuria. Anticoagulated.    Plan: -Continue NPO except for meds. -Insulin on Sliding scale. -Discussed blood transfusion as well as risks/benefits/and SEs. He agrees to blood transfusion if needed. -To surgery today for cystoscopy, clot evacuation, bladder/urethral biopsy, possible bilateral retrograde pyelograms.   Rolan Bucco, MD [phone]

## 2013-03-04 NOTE — Op Note (Signed)
Urology Operative Report  Date of Procedure: 03/04/13  Surgeon: Rolan Bucco, MD Assistant:  None  Preoperative Diagnosis: Gross hematuria. Bladder lesion. Postoperative Diagnosis:  Same  Procedure(s): Bladder biopsy. Urethral biopsy. Cystoscopy with clot evacuation.  Estimated blood loss: 50cc  Specimen: Bladder biopsy. Prostatic urethral biopsy.  Drains: 24Fr coude tip hematuria catheter (15cc water in balloon).  Complications: None  Transfusion: 1 unit PRBC  Findings: Large clot in bladder. Diffuse erythema of bladder neck, posterior wall, and trigone. Erythema of prostatic urethra. Prostatic obstruction with intravesical median lobe. Right ureter orifice effluxing fluid before and after surgery.  History of present illness: 73 year old male with history of prostate cancer status post radiation and on chronic Coumadin therapy for DVT/PE ease presents with a several week history of gross hematuria. He had to be admitted this past Monday for continuous bladder irrigation. This did not control his bleeding and therefore we felt proceeding to the operating room for clot evacuation be the best way to proceed. He received cardiac clearance prior to the procedure. His Coumadin has been held for 2 days, but as I remains therapeutic at 2.8 this morning.   Procedure in detail: After informed consent was obtained, the patient was taken to the operating room. They were placed in the supine position. SCDs were turned on and in place. IV antibiotics were infused, and general anesthesia was induced. A timeout was performed in which the correct patient, surgical site, and procedure were identified and agreed upon by the team.  The patient was placed in a dorsolithotomy position, making sure to pad all pertinent neurovascular pressure points. A belladonna and opium suppositories placed into the rectum. The genitals were prepped and draped in the usual sterile fashion.  The visual obturator  to the resectoscope was placed. It was noted that he had a very long urethra and an obstructing large prostate with an intravesical median lobe. Because of this it was difficult to reach inside of the bladder. Once inside the bladder I was able to evaluate the bladder with a 30 and 70 lens to evaluate the entire surface. There was a large clot in the bladder and this was evacuated with a Toomey syringe on the gyrus sheath. Once this was removed I noted a large amount of erythema surrounding the right ureter orifice posterior bladder wall, trigone, and bilateral lateral bladder walls as well as the bladder neck. This was more consistent with cystitis and then cancer. I took biopsies of the bladder in respective areas including the posterior bladder wall, the left lateral bladder wall. Also noted during placement was erythema and edema of the prostate urethra which was friable and bleeding. This was also biopsied with cold cup biopsy forcep. Areas of biopsy were then fulgurated with the gyrus button in normal saline.  As mentioned, the right ureter orifice was identified. Given that he had recently had a CT scan which showed no upper tract disease I did not feel a right retrograde PolyGram would be useful, and I was also concerned that placing a ureter catheter could cause more bleeding and I wanted to avoid needing a ureter stent which could stroke or bleeding. Ureter orifice was noted to have clear urine output. The button electrode of the gyrus was then used to fulgurate all of the bladder surface of the trigone, posterior and lateral bladder walls, and the posterior bladder neck. I did spare the anterior bladder neck to try prevent bladder neck contracture. The large median lobe of the prostate did make  it difficult to visualize inside the bladder and therefore I used the button electrode to vaporize portion of the median lobe. It was noted that his urethra so long I was not able to reach the bladder dome and  posterior areas of his bladder with the gyrus resectoscope. I did fulgurate some of the areas on the dome that were erythematous by letting out irrigation from the bladder and fulgurating the roof.  There was a large amount of blood loss during the procedure, but it is difficult to accurately estimate. Given the fact that the patient does have a cardiac history and his hemoglobin was down below 8 he was transfused 1 unit of packed red blood cells in the operating room without incident.  There was bleeding tissue around the right ureter orifice and therefore fulgurated this. I then took a resection loop and resected over the right ureter orifice to leave a fresh ureter opening. The patient was given intravenous methylene blue and I was able to see passage of clear blue urine from this ureter orifice. I then retracted the gyrus into the prostate urethra and performed some gentle fulguration in this area to try to help with some of the major areas that were bleeding. I then withdrew the resectoscope.  Placed in 10 cc of lidocaine jelly to the urethra and then a 24 [place] coud-tip hematuria catheter. I placed 15 cc of sterile water into the balloon. There was return of light pink urine. This was connected to continuous bladder irrigation. This completed the procedure. He's placed back in supine position, anesthesia was reversed, and he was taken to the PACU in stable condition.  He'll be admitted back to the floor for observation and continuous bladder irrigation. I will restart his coumadin tonight.  All counts were correct at the end of the case.

## 2013-03-05 ENCOUNTER — Encounter (HOSPITAL_COMMUNITY): Payer: Self-pay | Admitting: Urology

## 2013-03-05 LAB — PROTIME-INR
INR: 3.08 — AB (ref 0.00–1.49)
PROTHROMBIN TIME: 30.7 s — AB (ref 11.6–15.2)

## 2013-03-05 LAB — CBC
HCT: 24.2 % — ABNORMAL LOW (ref 39.0–52.0)
Hemoglobin: 7.5 g/dL — ABNORMAL LOW (ref 13.0–17.0)
MCH: 23.9 pg — ABNORMAL LOW (ref 26.0–34.0)
MCHC: 31 g/dL (ref 30.0–36.0)
MCV: 77.1 fL — ABNORMAL LOW (ref 78.0–100.0)
Platelets: 242 10*3/uL (ref 150–400)
RBC: 3.14 MIL/uL — ABNORMAL LOW (ref 4.22–5.81)
RDW: 17 % — ABNORMAL HIGH (ref 11.5–15.5)
WBC: 6.4 10*3/uL (ref 4.0–10.5)

## 2013-03-05 LAB — BASIC METABOLIC PANEL
BUN: 22 mg/dL (ref 6–23)
CHLORIDE: 105 meq/L (ref 96–112)
CO2: 23 mEq/L (ref 19–32)
Calcium: 7.5 mg/dL — ABNORMAL LOW (ref 8.4–10.5)
Creatinine, Ser: 2.06 mg/dL — ABNORMAL HIGH (ref 0.50–1.35)
GFR calc Af Amer: 36 mL/min — ABNORMAL LOW (ref >90)
GFR, EST NON AFRICAN AMERICAN: 31 mL/min — AB (ref >90)
Glucose, Bld: 127 mg/dL — ABNORMAL HIGH (ref 70–99)
POTASSIUM: 4.5 meq/L (ref 3.7–5.3)
SODIUM: 139 meq/L (ref 137–147)

## 2013-03-05 LAB — GLUCOSE, CAPILLARY
GLUCOSE-CAPILLARY: 135 mg/dL — AB (ref 70–99)
GLUCOSE-CAPILLARY: 145 mg/dL — AB (ref 70–99)
GLUCOSE-CAPILLARY: 154 mg/dL — AB (ref 70–99)
GLUCOSE-CAPILLARY: 112 mg/dL — AB (ref 70–99)
Glucose-Capillary: 141 mg/dL — ABNORMAL HIGH (ref 70–99)
Glucose-Capillary: 147 mg/dL — ABNORMAL HIGH (ref 70–99)
Glucose-Capillary: 159 mg/dL — ABNORMAL HIGH (ref 70–99)

## 2013-03-05 MED ORDER — OXYCODONE-ACETAMINOPHEN 5-325 MG PO TABS: 1.0000 | ORAL_TABLET | ORAL | Status: DC | PRN

## 2013-03-05 MED ORDER — HYOSCYAMINE SULFATE 0.125 MG PO TABS: 0.1250 mg | ORAL_TABLET | ORAL | Status: DC | PRN

## 2013-03-05 MED ORDER — OXYBUTYNIN CHLORIDE 5 MG PO TABS: 5.0000 mg | ORAL_TABLET | Freq: Four times a day (QID) | ORAL | Status: DC | PRN

## 2013-03-05 MED ORDER — SENNOSIDES-DOCUSATE SODIUM 8.6-50 MG PO TABS: 1.0000 | ORAL_TABLET | Freq: Two times a day (BID) | ORAL | Status: DC

## 2013-03-05 MED ORDER — OXYCODONE-ACETAMINOPHEN 5-325 MG PO TABS
1.0000 | ORAL_TABLET | ORAL | Status: DC | PRN
  Administered 2013-03-05 (×2): 2 via ORAL
  Administered 2013-03-05: 1 via ORAL
  Administered 2013-03-06 (×4): 2 via ORAL
  Administered 2013-03-07: 1 via ORAL
  Administered 2013-03-07 – 2013-03-08 (×4): 2 via ORAL
  Administered 2013-03-08 (×2): 1 via ORAL
  Administered 2013-03-09 (×3): 2 via ORAL
  Administered 2013-03-11: 1 via ORAL
  Administered 2013-03-11: 2 via ORAL
  Filled 2013-03-05 (×2): qty 2
  Filled 2013-03-05 (×3): qty 1
  Filled 2013-03-05 (×2): qty 2
  Filled 2013-03-05: qty 1
  Filled 2013-03-05 (×11): qty 2

## 2013-03-05 NOTE — Evaluation (Signed)
Physical Therapy One Time Evaluation Patient Details Name: [name] MRN: [id number] DOB: [birth year] Today's Date: 03/05/2013 Time: 1214-1225 PT Time Calculation (min): 11 min  PT Assessment / Plan / Recommendation History of Present Illness  73 year old with a complex medical history including severe exogenous obesity, venous insufficiency, history of prostate cancer and he is also status post left nephrectomy. He has had difficulty with hypertension and also has a history of pulmonary embolism for which he is anticoatulated.   He also said significant problems with lower extremity edema and has documented venous insufficiency of both his right and left superficial saphenous veins. He has a remote history of DVT/PE and has been on anticoagulation therapy with Coumadin. In 2009 he was referred for a sleep study which revealed severe obstructive sleep apnea with an AHI on the baseline portion of a split night protocol in excess of 100. Since 2009 he has been using CPAP therapy and has been on a 14 cm water pressure. He had a nuclear stress test in May 2011 which was reportedly nonischemic. His last echo study was 12/16/2012 with ejection fraction was 55-60%. There was mild mitral regurgitation, aortic sclerosis without stenosis. Estimated RV systolic pressure was normal at 23 mm.  He has a history of prostate CA and renal cell CA s/p left nephrectomy. He is now admitted with gross hematuria and Cardiology is asked to consult for preoperative clearance for cystoscopy, bladder biopsy and bladder fulguration.  Clinical Impression  Patient evaluated by Physical Therapy with no further acute PT needs identified. All education has been completed and the patient has no further questions.  Pt ambulated short distance in hallway with RW and reports 8/10 pain in foot due to mostly being in bed for last 3 days.  Recommended RW (wide, ?bari) for home use and pt is considering.  Pt overall is modified independent  with mobility with no further PT needs. PT is signing off. Thank you for this referral.     PT Assessment  Patent does not need any further PT services    Follow Up Recommendations  No PT follow up    Does the patient have the potential to tolerate intense rehabilitation      Barriers to Discharge        Equipment Recommendations  Rolling walker with 5" wheels (wide, ?bari)    Recommendations for Other Services     Frequency      Precautions / Restrictions Precautions Precautions: Fall   Pertinent Vitals/Pain 8/10 foot pain during ambulation, RN notified, repositioned     Mobility  Bed Mobility General bed mobility comments: pt standing at sink near door upon entering room with spouse present Transfers Overall transfer level: Modified independent Ambulation/Gait Ambulation/Gait assistance: Supervision Ambulation Distance (Feet): 50 Feet Assistive device: Rolling walker (2 wheeled) Gait Pattern/deviations: Step-through pattern;Antalgic Gait velocity: decr General Gait Details: pt reports h/o foot fx many years ago, pt reports pain in foot due to being in bed 3 days so provided RW for pt to use for ambulation to assist with pain control    Exercises     PT Diagnosis:    PT Problem List:   PT Treatment Interventions:       PT Goals(Current goals can be found in the care plan section) Acute Rehab PT Goals PT Goal Formulation: No goals set, d/c therapy  Visit Information  Last PT Received On: 03/05/13 Assistance Needed: +1 History of Present Illness: 73 year old with a complex medical history including severe exogenous  obesity, venous insufficiency, history of prostate cancer and he is also status post left nephrectomy. He has had difficulty with hypertension and also has a history of pulmonary embolism for which he is anticoatulated.   He also said significant problems with lower extremity edema and has documented venous insufficiency of both his right and left  superficial saphenous veins. He has a remote history of DVT/PE and has been on anticoagulation therapy with Coumadin. In 2009 he was referred for a sleep study which revealed severe obstructive sleep apnea with an AHI on the baseline portion of a split night protocol in excess of 100. Since 2009 he has been using CPAP therapy and has been on a 14 cm water pressure. He had a nuclear stress test in May 2011 which was reportedly nonischemic. His last echo study was 12/16/2012 with ejection fraction was 55-60%. There was mild mitral regurgitation, aortic sclerosis without stenosis. Estimated RV systolic pressure was normal at 23 mm.  He has a history of prostate CA and renal cell CA s/p left nephrectomy. He is now admitted with gross hematuria and Cardiology is asked to consult for preoperative clearance for cystoscopy, bladder biopsy and bladder fulguration.       Prior [place] expects to be discharged to:: Private residence Living Arrangements: Spouse/significant other Type of Home: House Home Access: Stairs to enter Technical brewer of Steps: 3 Home Layout: Multi-level Alternate Level Stairs-Number of Steps: states 3-4 steps various throughout home Home Equipment: None Prior Function Level of Independence: Independent Communication Communication: No difficulties    Cognition  Cognition Arousal/Alertness: Awake/alert Behavior During Therapy: WFL for tasks assessed/performed Overall Cognitive Status: Within Functional Limits for tasks assessed    Extremity/Trunk Assessment Lower Extremity Assessment Lower Extremity Assessment: Overall WFL for tasks assessed   Balance    End of Session PT - End of Session Activity Tolerance: Patient limited by pain Patient left: in chair;with call bell/phone within reach;with family/visitor present Nurse Communication: Mobility status;Patient requests pain meds  GP     Jamani Eley,KATHrine E 03/05/2013, 12:52 PM Carmelia Bake, PT, DPT 03/05/2013 Pager: [phone]

## 2013-03-05 NOTE — Progress Notes (Signed)
Spoke with pt regarding home cpap.  Pt stated he can/will place it on himself later when ready for bed.  Pt has his home cpap unit with full face mask.  No obvious defects or frays noted on cord.  Machine turned on and appeared to be working at this time.  Pt was advised that RT is available all night should he need further assistance.  Pt refused additional sterile water in humidity chamber.  Call will be placed for [employer] to inspect machine in the morning.

## 2013-03-05 NOTE — Progress Notes (Signed)
Urology Progress Note  Subjective:     No acute urologic events overnight. No clot obstruction. CBI turned off at 05:00; urine remains clear (blue colored due to methylene blue in OR).    ROS: Negative chest pain or SOB.  Objective:  Patient Vitals for the past 24 hrs:  BP Temp Temp src Pulse Resp SpO2  03/05/13 0610 124/71 mmHg 97.9 F (36.6 C) Oral 56 18 98 %  03/05/13 0310 125/52 mmHg 98.3 F (36.8 C) Oral 53 18 97 %  03/04/13 1958 130/62 mmHg 97.5 F (36.4 C) Oral 54 - 97 %  03/04/13 1657 134/58 mmHg 97.6 F (36.4 C) - 59 18 97 %  03/04/13 1550 - 98.1 F (36.7 C) - 76 - 100 %    Physical Exam: General:  No acute distress, awake Cardiovascular:    [x]   S1/S2 present, RRR  []   Irregularly irregular Chest:  CTA-B Abdomen:               []  Soft, appropriately TTP  [x]  Soft, NTTP  []  Soft, appropriately TTP, incision(s) clean/dry/intact  Genitourinary: Dorsal slit, negative foreskin erythema. Foley:  Draining clear blue urine. Off continuous bladder irrigation.    I/O last 3 completed shifts: In: 56505.8 [I.V.:4920.8; Blood:325; XVQMG:86761; IV Piggyback:100] Out: 95093 [OIZTI:45809; Blood:50]  Recent Labs     03/04/13  0505  03/05/13  0430  HGB  7.9*  7.5*  WBC  5.8  6.4  PLT  290  242    Recent Labs     03/04/13  0505  03/05/13  0430  NA  140  139  K  4.5  4.5  CL  106  105  CO2  25  23  BUN  19  22  CREATININE  1.50*  2.06*  CALCIUM  8.0*  7.5*  GFRNONAA  45*  31*  GFRAA  52*  36*     Recent Labs     03/04/13  0505  03/05/13  0430  INR  2.85*  3.08*     No components found with this basename: ABG,     Length of stay: 3 days.  Assessment: Gross hematuria. Anticoagulated. Surgery 03/04/13: cystoscopy, clot evacuation, bladder/urethral biopsy, bladder fulguration.   Plan: -No further transfusion. -Continue ambulation. -Likely discharge home today. - Hold coumadin tonight and tomorrow night; recheck INR Monday morning. -F/u w/ me  next week for voiding trial. -Will start process for hyperbaric oxygen as outpatient.   Rolan Bucco, MD [phone]

## 2013-03-06 ENCOUNTER — Encounter (HOSPITAL_COMMUNITY): Payer: Self-pay | Admitting: Internal Medicine

## 2013-03-06 ENCOUNTER — Inpatient Hospital Stay (HOSPITAL_COMMUNITY): Payer: BC Managed Care – PPO

## 2013-03-06 ENCOUNTER — Telehealth (HOSPITAL_COMMUNITY): Payer: Self-pay

## 2013-03-06 DIAGNOSIS — I959 Hypotension, unspecified: Secondary | ICD-10-CM

## 2013-03-06 DIAGNOSIS — N179 Acute kidney failure, unspecified: Secondary | ICD-10-CM | POA: Diagnosis not present

## 2013-03-06 DIAGNOSIS — I498 Other specified cardiac arrhythmias: Secondary | ICD-10-CM

## 2013-03-06 DIAGNOSIS — R001 Bradycardia, unspecified: Secondary | ICD-10-CM

## 2013-03-06 DIAGNOSIS — D62 Acute posthemorrhagic anemia: Secondary | ICD-10-CM | POA: Diagnosis present

## 2013-03-06 DIAGNOSIS — R102 Pelvic and perineal pain: Secondary | ICD-10-CM

## 2013-03-06 DIAGNOSIS — N189 Chronic kidney disease, unspecified: Secondary | ICD-10-CM

## 2013-03-06 LAB — URINALYSIS, ROUTINE W REFLEX MICROSCOPIC
Bilirubin Urine: NEGATIVE
GLUCOSE, UA: NEGATIVE mg/dL
Ketones, ur: NEGATIVE mg/dL
Nitrite: NEGATIVE
PH: 6 (ref 5.0–8.0)
Protein, ur: 100 mg/dL — AB
SPECIFIC GRAVITY, URINE: 1.016 (ref 1.005–1.030)
Urobilinogen, UA: 0.2 mg/dL (ref 0.0–1.0)

## 2013-03-06 LAB — BASIC METABOLIC PANEL
BUN: 33 mg/dL — ABNORMAL HIGH (ref 6–23)
CHLORIDE: 104 meq/L (ref 96–112)
CO2: 23 mEq/L (ref 19–32)
Calcium: 7.8 mg/dL — ABNORMAL LOW (ref 8.4–10.5)
Creatinine, Ser: 3.44 mg/dL — ABNORMAL HIGH (ref 0.50–1.35)
GFR calc non Af Amer: 17 mL/min — ABNORMAL LOW (ref >90)
GFR, EST AFRICAN AMERICAN: 19 mL/min — AB (ref >90)
Glucose, Bld: 128 mg/dL — ABNORMAL HIGH (ref 70–99)
POTASSIUM: 4.3 meq/L (ref 3.7–5.3)
SODIUM: 139 meq/L (ref 137–147)

## 2013-03-06 LAB — HEMOGLOBIN AND HEMATOCRIT, BLOOD
HCT: 25.6 % — ABNORMAL LOW (ref 39.0–52.0)
Hemoglobin: 8.1 g/dL — ABNORMAL LOW (ref 13.0–17.0)

## 2013-03-06 LAB — CBC
HCT: 22.4 % — ABNORMAL LOW (ref 39.0–52.0)
Hemoglobin: 6.9 g/dL — CL (ref 13.0–17.0)
MCH: 23.7 pg — ABNORMAL LOW (ref 26.0–34.0)
MCHC: 30.8 g/dL (ref 30.0–36.0)
MCV: 77 fL — ABNORMAL LOW (ref 78.0–100.0)
PLATELETS: 266 10*3/uL (ref 150–400)
RBC: 2.91 MIL/uL — ABNORMAL LOW (ref 4.22–5.81)
RDW: 17.4 % — AB (ref 11.5–15.5)
WBC: 7.3 10*3/uL (ref 4.0–10.5)

## 2013-03-06 LAB — PREPARE RBC (CROSSMATCH)

## 2013-03-06 LAB — PROTIME-INR
INR: 3.04 — ABNORMAL HIGH (ref 0.00–1.49)
PROTHROMBIN TIME: 30.4 s — AB (ref 11.6–15.2)

## 2013-03-06 LAB — GLUCOSE, CAPILLARY
Glucose-Capillary: 120 mg/dL — ABNORMAL HIGH (ref 70–99)
Glucose-Capillary: 124 mg/dL — ABNORMAL HIGH (ref 70–99)

## 2013-03-06 LAB — URINE MICROSCOPIC-ADD ON

## 2013-03-06 MED ORDER — CARVEDILOL 3.125 MG PO TABS
3.1250 mg | ORAL_TABLET | Freq: Two times a day (BID) | ORAL | Status: DC
  Administered 2013-03-06: 3.125 mg via ORAL
  Filled 2013-03-06 (×4): qty 1

## 2013-03-06 MED ORDER — SENNA 8.6 MG PO TABS: 2.0000 | ORAL_TABLET | Freq: Every day | ORAL | Status: DC

## 2013-03-06 MED ORDER — POLYETHYLENE GLYCOL 3350 17 G PO PACK
17.0000 g | PACK | Freq: Every day | ORAL | Status: DC | PRN
  Administered 2013-03-07: 17 g via ORAL
  Filled 2013-03-06 (×2): qty 1

## 2013-03-06 MED ORDER — BISACODYL 10 MG RE SUPP: 10.0000 mg | Freq: Every day | RECTAL | Status: DC | PRN

## 2013-03-06 MED ORDER — DOCUSATE SODIUM 100 MG PO CAPS
100.0000 mg | ORAL_CAPSULE | Freq: Two times a day (BID) | ORAL | Status: DC
  Filled 2013-03-06: qty 1

## 2013-03-06 MED ORDER — DOCUSATE SODIUM 100 MG PO CAPS
100.0000 mg | ORAL_CAPSULE | Freq: Two times a day (BID) | ORAL | Status: DC
  Administered 2013-03-06 – 2013-03-12 (×7): 100 mg via ORAL
  Filled 2013-03-06 (×13): qty 1

## 2013-03-06 MED ORDER — SENNA 8.6 MG PO TABS
2.0000 | ORAL_TABLET | Freq: Every day | ORAL | Status: DC
  Administered 2013-03-06 – 2013-03-11 (×4): 17.2 mg via ORAL
  Filled 2013-03-06 (×4): qty 2

## 2013-03-06 NOTE — H&P (Signed)
[facility] History and Physical  [name] [id number] DOB: Dec 05, 1940 DOA: 03/02/2013  Referring physician:  Dr. Jasmine December PCP:  [facility]   Chief Complaint:  Hypotension, AKI, request transfer of care from Urology to Hospitalist service due to complexity of care  HPI:  The patient is a 73 y.o. 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  The patient was last at their baseline health in early January.  Since the second week of January, he has been having intermittent gross hematuria requiring frequent outpatient visits to urology.  Despite placement of foley catheter, initiation of 5-alpha reductase inhibitors and bladder irrigation, he continued to have intermittent obstruction.  He was admitted on 1/26 to start continuous bladder irrigation.  He underwent cystoscopy with clot evacuation on 1/28 by Dr. Jasmine December with bladder and prostatic urethral biopsies and his obstructions and bleeding have tapered.  He continues his anticoagulation with warfarin and his INR has remained near 3. He has received 1 dose of coumadin dosed by pharmacy since admission.  Over the last several days, [name] blood pressure has gradually been trending down until he was frankly hypotensive yesterday to 80s/50s.  He has also been bradycardic to the 40s and 50s, asymptomatic and a chronic problem for him.  Today, his BUN trended up to 33 and his creatinine trended up to 3.44 and RUS demonstrated only mild fullness of the right collecting system without frank hydronephrosis, absent left kidney.  Upon  review of his medication list, he has not received any NSAIDS or other obvious nephrotoxins, however, he has been getting diuresis with lasix up to twice daily.  Ins and outs have been recorded, but are likely inaccurate secondary to his continuous bladder irrigation.  He was also anemic to 6.9 and has already been ordered 1 unit PRBC.  [name] is being transferred to hospitalist service for ongoing management of hypotension, AKI, bradycardia, and anemia.     Review of Systems:  General:  Denies fevers, chills, weight loss or gain HEENT:  Denies changes to hearing and vision, rhinorrhea, sinus congestion, sore throat CV:  Denies chest pain and palpitations, chronic lower extremity edema is stable PULM:  Denies SOB, wheezing, cough.   GI:  Denies nausea, vomiting, constipation, diarrhea.   GU:  Foley in place ENDO:  Denies polyuria, polydipsia.   HEME:  Denies hematemesis, blood in stools, melena, abnormal bruising or bleeding.  LYMPH:  Denies lymphadenopathy.   MSK:  Having severe perineal pain "like sitting on cinderblock"   DERM:  Denies skin rash or ulcer.   NEURO:  Denies focal numbness, weakness, slurred speech, confusion, facial droop.  PSYCH:  Denies anxiety and depression.    Past Medical History  Diagnosis Date  . Obesity   . Phlebitis     Lower extermity  . Pulmonary emboli 2008    submassive, saddle  . Prostate cancer 07/2009  . Sleep apnea     on CPAP  . Hx of echocardiogram 12/04/2010    Normal EF >55% no significant valve disease  . History of stress test 06/27/2009    Low risk and EF of approximately 50%  .  DVT (deep venous thrombosis)   . Diabetes mellitus   . Chronic kidney disease, stage 3     baseline creatinine ~1.4  . HLD (hyperlipidemia)   . HTN (hypertension)    Past Surgical History  Procedure Laterality Date  . Nephrectomy  1999    for CA  . Cholecystectomy  1999  . Transurethral resection of bladder tumor N/A 03/04/2013    Procedure: CYSTOSCOPY  WITH RIGHT RETROGRADE PYELOGRAM AND BLADDER BIOPSY /CLOT EVACUATION/ BIOPSY PROSTATIC URETHRA WITH FULGERATION ;  Surgeon: Molli Hazard, MD;  Location: [facility];  Service: Urology;  Laterality: N/A;   Social History:  reports that he quit smoking about 65 years ago. He has never used smokeless tobacco. He reports that he drinks alcohol. He reports that he does not use illicit drugs.   No Known Allergies  Family History  Problem Relation Age of Onset  . Cancer Mother   . Diabetes Father   . Cancer Father      Prior to Admission medications   Medication Sig Start Date End Date Taking? Authorizing Provider  alendronate (FOSAMAX) 70 MG tablet Take 1 tablet by mouth every Monday.  11/24/12  Yes Historical Provider, MD  aspirin 81 MG tablet Take 81 mg by mouth at bedtime.    Yes Historical Provider, MD  carvedilol (COREG) 12.5 MG tablet Take 12.5 mg by mouth 2 (two) times daily with a meal.   Yes Historical Provider, MD  fenofibrate (TRICOR) 145 MG tablet Take 145 mg by mouth daily.  10/18/12  Yes Historical Provider, MD  ferrous sulfate 325 (65 FE) MG EC tablet Take 325 mg by mouth daily.   Yes Historical Provider, MD  finasteride (PROSCAR) 5 MG tablet Take 5 mg by mouth every evening.   Yes Historical Provider, MD  fish oil-omega-3 fatty acids 1000 MG capsule Take 1 g by mouth 2 (two) times daily.    Yes Historical Provider, MD  furosemide (LASIX) 40 MG tablet Take 40 mg by mouth daily.   Yes Historical Provider, MD  glucosamine-chondroitin 500-400 MG tablet Take 1,500 tablets by mouth 2 (two) times daily.   Yes Historical Provider, MD  Multiple Vitamin (MULTIVITAMIN WITH MINERALS) TABS tablet Take 1 tablet by mouth daily.   Yes Historical Provider, MD  naproxen sodium (ANAPROX) 220 MG tablet Take 220 mg by mouth 2 (two) times daily with a meal.   Yes Historical Provider, MD  Niacin CR 1000 MG TBCR Take 1,000 mg by mouth at bedtime.  11/06/12  Yes Historical Provider, MD  NOVOLOG MIX  70/30 (70-30) 100 UNIT/ML injection Inject 70 Units into the skin 2 (two) times daily.  11/06/12  Yes Historical Provider, MD  potassium chloride (K-DUR,KLOR-CON) 10 MEQ tablet Take 10 mEq by mouth daily.   Yes Historical Provider, MD  quinapril (ACCUPRIL) 40 MG tablet Take 40 mg by mouth 2 (two) times daily.  09/22/12  Yes Historical Provider, MD  tamsulosin (FLOMAX) 0.4 MG CAPS capsule Take 0.4 mg by mouth 2 (two) times daily.   Yes Historical Provider, MD  warfarin (COUMADIN) 5 MG tablet Take 5-7.5 mg by mouth daily. 5 mg on Monday and Thursday and 7.5 mg all other days 11/13/12  Yes Historical Provider, MD  hyoscyamine (LEVSIN, ANASPAZ) 0.125 MG tablet Take 1 tablet (0.125 mg total) by mouth every 4 (four) hours as needed (bladder spasms). 03/05/13   Molli Hazard, MD  oxybutynin (DITROPAN) 5 MG tablet Take 1 tablet (5 mg total) by mouth every  6 (six) hours as needed for bladder spasms. 03/05/13   Molli Hazard, MD  oxyCODONE-acetaminophen (PERCOCET) 5-325 MG per tablet Take 1-2 tablets by mouth every 4 (four) hours as needed. 03/05/13   Molli Hazard, MD  senna-docusate (SENOKOT S) 8.6-50 MG per tablet Take 1 tablet by mouth 2 (two) times daily. 03/05/13   Molli Hazard, MD  tamsulosin (FLOMAX) 0.4 MG CAPS capsule Take 0.4 mg by mouth daily.  11/06/12   Historical Provider, MD   Physical Exam: Filed Vitals:   03/06/13 1005 03/06/13 1105 03/06/13 1145 03/06/13 1356  BP: 131/64 129/50 117/55 131/55  Pulse: 55 49 51 45  Temp: 97.8 F (36.6 C) 98.2 F (36.8 C) 98 F (36.7 C) 98 F (36.7 C)  TempSrc: Oral Oral Oral Oral  Resp: 18 20 20 18   Height:      Weight:      SpO2:   99% 97%     General:  [name], NAD  Eyes:  PERRL, anicteric, non-injected.  ENT:  Nares clear.  OP clear, non-erythematous without plaques or exudates.  MMM.  Neck:  Supple without TM or JVD.    Lymph:  No cervical, supraclavicular, or submandibular LAD.  Cardiovascular:  RRR, normal  S1, S2, 3/6 systolic murmur at the LSB and apex.  2+ pulses, warm extremities  Respiratory:  CTA bilaterally without increased WOB.  Abdomen:  NABS.  Soft, ND/NT.    Skin:  No rashes or focal lesions.  Musculoskeletal:  Normal bulk and tone.  Trace bilateral LE edema.  Psychiatric:  A & O x 4.  Appropriate affect.  Neurologic:  CN 3-12 intact.  5/5 strength.  Sensation intact.  Labs on Admission:  Basic Metabolic Panel:  Recent Labs Lab 03/03/13 0435 03/04/13 0505 03/05/13 0430 03/06/13 0400  NA 141 140 139 139  K 4.3 4.5 4.5 4.3  CL 104 106 105 104  CO2 24 25 23 23   GLUCOSE 116* 154* 127* 128*  BUN 21 19 22  33*  CREATININE 1.53* 1.50* 2.06* 3.44*  CALCIUM 8.3* 8.0* 7.5* 7.8*   Liver Function Tests: No results found for this basename: AST, ALT, ALKPHOS, BILITOT, PROT, ALBUMIN,  in the last 168 hours No results found for this basename: LIPASE, AMYLASE,  in the last 168 hours No results found for this basename: AMMONIA,  in the last 168 hours CBC:  Recent Labs Lab 03/03/13 0435 03/04/13 0505 03/05/13 0430 03/06/13 0400 03/06/13 1320  WBC 6.0 5.8 6.4 7.3  --   HGB 9.1* 7.9* 7.5* 6.9* 8.1*  HCT 29.6* 25.9* 24.2* 22.4* 25.6*  MCV 75.3* 76.0* 77.1* 77.0*  --   PLT 331 290 242 266  --    Cardiac Enzymes: No results found for this basename: CKTOTAL, CKMB, CKMBINDEX, TROPONINI,  in the last 168 hours  BNP (last 3 results) No results found for this basename: PROBNP,  in the last 8760 hours CBG:  Recent Labs Lab 03/05/13 1155 03/05/13 1655 03/05/13 2041 03/06/13 0720 03/06/13 1123  GLUCAP 141* 147* 159* 120* 124*    Radiological Exams on Admission: US Renal  03/06/2013   CLINICAL DATA:  Bladder surgery, evaluate for hydronephrosis  EXAM: RENAL/URINARY TRACT ULTRASOUND COMPLETE  COMPARISON:  CT abdomen pelvis dated 03/02/2013  FINDINGS: Right Kidney:  Length: 16.2 cm. Mild fullness of the renal collecting system with upper pole caliectasis. No frank  hydronephrosis.  Left Kidney:  Surgically absent.  Bladder:  Bladder decompressed by indwelling Foley catheter.  IMPRESSION: Mild  fullness of the right renal collecting system without frank hydronephrosis.  Left kidney surgically absent.  Bladder decompressed by indwelling Foley catheter.  These results were called by telephone at the time of interpretation on 03/06/2013 at 0950 hours to Dr. Rolan Bucco , who verbally acknowledged these results.   Electronically Signed   By: Julian Hy M.D.   On: 03/06/2013 10:06    EKG: pending  Assessment/Plan Principal Problem:   Acute-on-chronic kidney injury, baseline CKD stage 3 Active Problems:   Chronic anticoagulation   History of pulmonary embolism   Essential hypertension   Gross hematuria   Preoperative clearance   Acute blood loss anemia   Hypotension, unspecified   Bradycardia   Perineal pain  ---  Gross hematuria -  Management per urology -  Due to severity of bleeding, will discontinue ASA as this is being used for primary prevention of ACS, not secondary prevention  Acute on chronic kidney injury, baseline CKD stage 3 with creatinine of ~1.4, likely secondary to dehydration from lasix and poor oral intake last few days.  No obvious nephrotoxins or contrast exposure in the last few days.   -  UA -  FENa (although may be falsely elevated by recent lasix exposure).  FEurea takes several days to report and is therefore not helpful. -  Agree with tx of anemia and IVF -  D/c lasix -  Repeat creatinine in AM -  If creatinine remains elevated despite hydration, will consult nephrology -  Renally dose medications  -  Minimize nephrotoxins  Hypotension in setting of chronic hypertension, also likely secondary to dehydration -  IVF -  ACEI already held -  Decrease carvedilol to 3.125mg  and place hold parameter  Bradycardia, chronic problem for patient -  Telemetry -  ECG to eval for heart block -  Decrease carvedilol -   Once feeling better, will have patient ambulate to make sure HR increases by 10-20 bpm with exertion  HLD, stable.  Continue niacin  IDDM, CBG well controlled -  Continue high dose SSI  Chronic anticoagulation due ot hx of DVT and PE -  Continue coumadin per pharmacy  -  Ensure bowel movements are regular, start stool softeners  Severe OSA on CPAP  Perineal pain, may be related to recent bladder problems and procedures, however, if not improving, consider CT scan of pelvis -  RUS without obvious abnl to explain pain -  Continue prn oxycodone  Acute blood loss anemia and likely iron deficiency anemia -  Iron studies, B12, folate, SPEP/UPEP.  Defer TSH due to acute illness -  Transfuse 1 unit PRBC:  hgb increased appropriately  Diet:  diabetic Access:  PIV IVF:  yes Proph:  coumadin  Code Status: full code Family Communication: patient alone Disposition Plan: Admit to telemetry.  PT/OT.  Awaiting improvement in kidney function and blood pressure  Time spent: 60 min Margaretta Chittum [facility] Pager [phone]  If 7PM-7AM, please contact night-coverage [email or web address] Password [email or web address] 03/06/2013, 4:25 PM

## 2013-03-06 NOTE — Progress Notes (Signed)
Urology Progress Note  Subjective:     No acute urologic events overnight. No clot obstruction.   Denies lightheadedness. Worked w/ PT yesterday. Percocet working for pelvic pain.    ROS: Negative chest pain or SOB.  Objective:  Patient Vitals for the past 24 hrs:  BP Temp Temp src Pulse Resp SpO2  03/06/13 0505 109/56 mmHg 97.8 F (36.6 C) Oral 48 20 96 %  03/05/13 2300 94/56 mmHg - - 58 - -  03/05/13 2045 88/44 mmHg - - - - -  03/05/13 2031 87/49 mmHg 98.1 F (36.7 C) Oral 53 19 98 %  03/05/13 1247 121/55 mmHg 98.6 F (37 C) Oral 56 20 92 %  03/05/13 1100 - 98.8 F (37.1 C) Oral 56 20 95 %  03/05/13 1046 103/59 mmHg - - - - -  03/05/13 0815 142/64 mmHg 98.3 F (36.8 C) Oral 62 20 94 %    Physical Exam: General:  No acute distress, awake Cardiovascular:    [x]   S1/S2 present, RRR  []   Irregularly irregular Chest:  CTA-B Abdomen:               []  Soft, appropriately TTP  [x]  Soft, NTTP  []  Soft, appropriately TTP, incision(s) clean/dry/intact  Genitourinary: Dorsal slit, negative foreskin erythema. Foley:  Draining clear blue urine. Off continuous bladder irrigation.    I/O last 3 completed shifts: In: 14653.8 [P.O.:360; I.V.:2393.8; Other:11800; IV Piggyback:100] Out: 12675 [Urine:12675]  Recent Labs     03/05/13  0430  03/06/13  0400  HGB  7.5*  6.9*  WBC  6.4  7.3  PLT  242  266    Recent Labs     03/05/13  0430  03/06/13  0400  NA  139  139  K  4.5  4.3  CL  105  104  CO2  23  23  BUN  22  33*  CREATININE  2.06*  3.44*  CALCIUM  7.5*  7.8*  GFRNONAA  31*  17*  GFRAA  36*  19*     Recent Labs     03/05/13  0430  03/06/13  0400  INR  3.08*  3.04*     No components found with this basename: ABG,     Length of stay: 4 days.  Assessment:  Gross hematuria. Anticoagulated.  Blood loss anemia.  Renal insufficiency.  Surgery 03/04/13: cystoscopy, clot evacuation, bladder/urethral biopsy, bladder fulguration.   Plan: -IV fluids  not being given this morning and may contribute to renal insufficiency; will restart NS @125ml /hr. -Transfuse 1 unit PRBCs. Recheck H&H one hour after transfuse. Daily H&H. -Continue ambulation. PT recommends roller walker; ordered. -NPO. Renal US to check for right hydronephrosis and r/o post-renal cause. May be pre-renal due to anemia. - Continue to hold coumadin; INR still at 3.  -Disposition: no discharge today.    Rolan Bucco, MD [phone]

## 2013-03-06 NOTE — Progress Notes (Signed)
Pt seen, already wearing home cpap unit and tolerating well.  Pt stated he was fine and did not need anything else at this time.

## 2013-03-06 NOTE — Progress Notes (Signed)
I called and spoke with the radiologist.  There are certainly some fullness to his renal pelvis, but no overt hydronephrosis.  The proximal ureter is not visualized.  I called and spoke with the patient.  He has a little bit of bradycardia which is asymptomatic according to the nurse.  I have contacted the hospitalist and requested a transfer of care which they have accepted.  I explained this to the patient that I will still follow along as a Optometrist.  We discussed removing his Foley catheter.  It could be that the balloon of the catheter could be partially blocking the ureter, but at the same time without a Foley catheter placed on not sure we be able to assess accurately his urine volume and urine output.  He voiced understanding.

## 2013-03-06 NOTE — Progress Notes (Signed)
CRITICAL VALUE ALERT  Critical value received:  hgb 6.9  Date of notification:  03/06/2013  Time of notification:  0455  Critical value read back:yes  Nurse who received alert:  mk  MD notified (1st page):  Na  Result similar to previously reported result, and MD's  rounding next hr  Time of first page:  na  MD notified (2nd page):  Time of second page:  Responding MD:  na  Time MD responded:  na

## 2013-03-07 DIAGNOSIS — I440 Atrioventricular block, first degree: Secondary | ICD-10-CM

## 2013-03-07 LAB — IRON AND TIBC
Iron: 11 ug/dL — ABNORMAL LOW (ref 42–135)
Saturation Ratios: 4 % — ABNORMAL LOW (ref 20–55)
TIBC: 272 ug/dL (ref 215–435)
UIBC: 261 ug/dL (ref 125–400)

## 2013-03-07 LAB — FERRITIN: Ferritin: 51 ng/mL (ref 22–322)

## 2013-03-07 LAB — BASIC METABOLIC PANEL
BUN: 35 mg/dL — ABNORMAL HIGH (ref 6–23)
CHLORIDE: 105 meq/L (ref 96–112)
CO2: 22 mEq/L (ref 19–32)
Calcium: 7.9 mg/dL — ABNORMAL LOW (ref 8.4–10.5)
Creatinine, Ser: 2.82 mg/dL — ABNORMAL HIGH (ref 0.50–1.35)
GFR calc Af Amer: 24 mL/min — ABNORMAL LOW (ref >90)
GFR, EST NON AFRICAN AMERICAN: 21 mL/min — AB (ref >90)
Glucose, Bld: 116 mg/dL — ABNORMAL HIGH (ref 70–99)
Potassium: 4.4 mEq/L (ref 3.7–5.3)
SODIUM: 138 meq/L (ref 137–147)

## 2013-03-07 LAB — TRANSFERRIN: TRANSFERRIN: 210 mg/dL — AB (ref 200–360)

## 2013-03-07 LAB — GLUCOSE, CAPILLARY
Glucose-Capillary: 124 mg/dL — ABNORMAL HIGH (ref 70–99)
Glucose-Capillary: 122 mg/dL — ABNORMAL HIGH (ref 70–99)
Glucose-Capillary: 166 mg/dL — ABNORMAL HIGH (ref 70–99)
Glucose-Capillary: 112 mg/dL — ABNORMAL HIGH (ref 70–99)

## 2013-03-07 LAB — CREATININE, URINE, RANDOM: Creatinine, Urine: 139.5 mg/dL

## 2013-03-07 LAB — PROTIME-INR
INR: 2.38 — ABNORMAL HIGH (ref 0.00–1.49)
Prothrombin Time: 25.2 s — ABNORMAL HIGH (ref 11.6–15.2)

## 2013-03-07 LAB — HEMOGLOBIN AND HEMATOCRIT, BLOOD
HEMATOCRIT: 23.7 % — AB (ref 39.0–52.0)
HEMOGLOBIN: 7.4 g/dL — AB (ref 13.0–17.0)

## 2013-03-07 LAB — SODIUM, URINE, RANDOM: SODIUM UR: 46 meq/L

## 2013-03-07 LAB — VITAMIN B12: Vitamin B-12: 110 pg/mL — ABNORMAL LOW (ref 211–911)

## 2013-03-07 LAB — FOLATE RBC: RBC Folate: 1213 ng/mL — ABNORMAL HIGH

## 2013-03-07 MED ORDER — ISOSORB DINITRATE-HYDRALAZINE 20-37.5 MG PO TABS
1.0000 | ORAL_TABLET | Freq: Two times a day (BID) | ORAL | Status: DC
  Administered 2013-03-07 – 2013-03-08 (×2): 1 via ORAL
  Filled 2013-03-07 (×4): qty 1

## 2013-03-07 MED ORDER — FERROUS SULFATE 325 (65 FE) MG PO TABS
325.0000 mg | ORAL_TABLET | Freq: Three times a day (TID) | ORAL | Status: DC
  Administered 2013-03-07 – 2013-03-12 (×17): 325 mg via ORAL
  Filled 2013-03-07 (×19): qty 1

## 2013-03-07 MED ORDER — DEXTROSE 5 % IV SOLN
1.0000 g | INTRAVENOUS | Status: DC
  Administered 2013-03-07 – 2013-03-09 (×3): 1 g via INTRAVENOUS
  Filled 2013-03-07 (×3): qty 10

## 2013-03-07 MED ORDER — WARFARIN - PHARMACIST DOSING INPATIENT: Freq: Every day | Status: DC

## 2013-03-07 MED ORDER — WARFARIN SODIUM 5 MG PO TABS
5.0000 mg | ORAL_TABLET | Freq: Once | ORAL | Status: DC
  Filled 2013-03-07: qty 1

## 2013-03-07 NOTE — Progress Notes (Signed)
Patient had two pauses on the heart monitor. One 2.1 seconds and the other 2.4 seconds. Patient was asleep on his CPAP. On call NP made aware.

## 2013-03-07 NOTE — Progress Notes (Addendum)
[facility] PROGRESS NOTE  [name] URK:[id number] DOB: 1941-01-04 DOA: 03/02/2013 PCP: Melinda Crutch  Assessment/Plan  Gross hematuria, recurrent - had initially planned to redose warfarin, but will hold off given recurrent hematuria - Management per urology   Acute on chronic kidney injury, baseline CKD stage 3 with creatinine of ~1.4, likely secondary to dehydration from lasix and poor oral intake last few days. No obvious nephrotoxins or contrast exposure in the last few days.  Resolving with hydration.   - RUS without obstruction - FENa prerenal - continue but decrease IVF  - Renally dose medications  - Minimize nephrotoxins   Hypotension likely secondary to dehydration and resolved with IVF - continue IVF  - ACEI already held  - d/c carvedilol  Bradycardia with 1st degree AV block and having pauses of > 2 seconds on telemetry despite reduced BB dose.  HR increases appropriately (>20bpm) with exertion -  D/c carvedilol -  Avoid norvasc as was stopped recently by cards due to LEE -  Continue telemetry now that BB off to screen for 2nd/3rd degree heart block -  ECG:  1st degree AV block  HLD, stable. 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  May be due to cystitis from radiation or from UTI. - RUS without obvious abnl to explain pain  - Continue prn oxycodone   Possible UTI -  Ceftriaxone -  F/u urine culture  Acute blood loss anemia and likely iron deficiency anemia, hemoglobin improved after blood transfusion - Iron studies (and history) suggestive of iron deficiency,  start iron supplements - B12, folate, SPEP/UPEP pending.  -  Defer TSH due to acute illness   Diet: diabetic  Access: PIV  IVF: yes  Proph: coumadin   Code Status: full code  Family Communication: patient alone  Disposition Plan:  No PT/OT f/u. Awaiting improvement in kidney function.     Consultants:  Urology  Procedures:  Cystoscopy with clot evacuation and bladder and urethral biopsies 1/28  Antibiotics:  Ceftriaxone 1/31 >>   HPI/Subjective:  States he walked up and down the halls twice yesterday and is feeling somewhat better.     Objective: Filed Vitals:   03/06/13 1750 03/06/13 2153 03/07/13 0500 03/07/13 1327  BP: 146/65 130/64 127/61 141/61  Pulse: 53 58 51 54  Temp:  99.6 F (37.6 C) 98.7 F (37.1 C) 98.7 F (37.1 C)  TempSrc:  Oral Oral Oral  Resp:  18 18 18   Height:      Weight:   152.9 kg (337 lb 1.3 oz)   SpO2:  94% 98% 97%    Intake/Output Summary (Last 24 hours) at 03/07/13 1533 Last data filed at 03/07/13 1328  Gross per 24 hour  Intake   2475 ml  Output   1500 ml  Net    975 ml   Filed Weights   03/02/13 1900 03/07/13 0500  Weight: 152.2 kg (335 lb 8.6 oz) 152.9 kg (337 lb 1.3 oz)    Exam:   General:  [name], No acute distress  HEENT:  NCAT, MMM  Cardiovascular:  RRR, nl S1, S2 no mrg, 2+ pulses, warm extremities  Respiratory:  CTAB, no increased WOB  Abdomen:   NABS, soft, NT/ND  MSK:   Normal tone and bulk, trace bilateral LEE  Neuro:  Grossly intact  GU:  Urine in foley is green  Data Reviewed: Basic Metabolic Panel:  Recent Labs Lab 03/03/13 0435 03/04/13 0505 03/05/13 0430 03/06/13 0400 03/07/13 0555  NA 141 140 139 139 138  K 4.3 4.5 4.5 4.3 4.4  CL 104 106 105 104 105  CO2 24 25 23 23 22   GLUCOSE 116* 154* 127* 128* 116*  BUN 21 19 22  33* 35*  CREATININE 1.53* 1.50* 2.06* 3.44* 2.82*  CALCIUM 8.3* 8.0* 7.5* 7.8* 7.9*   Liver Function Tests: No results found for this basename: AST, ALT, ALKPHOS,  BILITOT, PROT, ALBUMIN,  in the last 168 hours No results found for this basename: LIPASE, AMYLASE,  in the last 168 hours No results found for this basename: AMMONIA,  in the last 168 hours CBC:  Recent Labs Lab 03/03/13 0435 03/04/13 0505 03/05/13 0430 03/06/13 0400 03/06/13 1320 03/07/13 0555  WBC 6.0 5.8 6.4 7.3  --   --   HGB 9.1* 7.9* 7.5* 6.9* 8.1* 7.4*  HCT 29.6* 25.9* 24.2* 22.4* 25.6* 23.7*  MCV 75.3* 76.0* 77.1* 77.0*  --   --   PLT 331 290 242 266  --   --    Cardiac Enzymes: No results found for this basename: CKTOTAL, CKMB, CKMBINDEX, TROPONINI,  in the last 168 hours BNP (last 3 results) No results found for this basename: PROBNP,  in the last 8760 hours CBG:  Recent Labs Lab 03/06/13 0902 03/06/13 1123 03/06/13 1633 03/06/13 2230 03/07/13 0746  GLUCAP 124* 124* 122* 166* 112*    Recent Results (from the past 240 hour(s))  SURGICAL PCR SCREEN     Status: None   Collection Time    03/03/13  3:58 PM      Result Value Range Status   MRSA, PCR NEGATIVE  NEGATIVE Final   Staphylococcus aureus NEGATIVE  NEGATIVE Final   Comment:            The Xpert SA Assay (FDA     approved for NASAL specimens     in patients over 21 years of age),     is one component of     a comprehensive surveillance     program.  Test performance has     been validated by [employer] for patients greater     than or equal to 27 year old.     It is not intended     to diagnose infection nor to     guide or monitor treatment.     Studies: US Renal  03/06/2013   CLINICAL DATA:  Bladder surgery, evaluate for hydronephrosis  EXAM: RENAL/URINARY TRACT ULTRASOUND COMPLETE  COMPARISON:  CT abdomen pelvis dated 03/02/2013  FINDINGS: Right Kidney:  Length: 16.2 cm. Mild fullness of the renal collecting system with upper pole caliectasis. No frank hydronephrosis.  Left Kidney:  Surgically absent.  Bladder:  Bladder decompressed by indwelling Foley catheter.  IMPRESSION: Mild  fullness of the right renal collecting system without frank hydronephrosis.  Left kidney surgically absent.  Bladder decompressed by indwelling Foley catheter.  These results were called by telephone at the time of interpretation on 03/06/2013 at 0950 hours to Dr. Rolan Bucco , who verbally acknowledged these results.   Electronically Signed  By: Julian Hy M.D.   On: 03/06/2013 10:06    Scheduled Meds: . cefTRIAXone (ROCEPHIN)  IV  1 g Intravenous Q24H  . docusate sodium  100 mg Oral BID  . ferrous sulfate  325 mg Oral TID WC  . finasteride  5 mg Oral QPM  . insulin aspart  0-20 Units Subcutaneous TID WC  . insulin aspart  0-5 Units Subcutaneous QHS  . niacin  1,000 mg Oral QHS  . senna  2 tablet Oral QHS  . tamsulosin  0.4 mg Oral BID  . warfarin  5 mg Oral ONCE-1800  . Warfarin - Pharmacist Dosing Inpatient   Does not apply q1800   Continuous Infusions: . sodium chloride 125 mL/hr at 03/07/13 1105  . sodium chloride irrigation      Principal Problem:   Acute-on-chronic kidney injury, baseline CKD stage 3 Active Problems:   Chronic anticoagulation   History of pulmonary embolism   Essential hypertension   Gross hematuria   Preoperative clearance   Acute blood loss anemia   Hypotension, unspecified   Bradycardia   Perineal pain    Time spent: 30 min    Shaneece Stockburger, [place]  [facility] Pager [phone]. If 7PM-7AM, please contact night-coverage at [email or web address], password [facility] 03/07/2013, 3:33 PM  LOS: 5 days

## 2013-03-07 NOTE — Progress Notes (Signed)
Patient ID: [name], male   DOB: September 18, 1940, 73 y.o.   MRN: [id number]  3 Days Post-Op Subjective: Pt denies flank pain.  Catheter has been draining well.  Objective: Vital signs in last 24 hours: Temp:  [97.8 F (36.6 C)-99.6 F (37.6 C)] 98.7 F (37.1 C) (01/31 0500) Pulse Rate:  [45-58] 51 (01/31 0500) Resp:  [18-20] 18 (01/31 0500) BP: (117-146)/(50-65) 127/61 mmHg (01/31 0500) SpO2:  [94 %-99 %] 98 % (01/31 0500) Weight:  [152.9 kg (337 lb 1.3 oz)] 152.9 kg (337 lb 1.3 oz) (01/31 0500)  Intake/Output from previous day: 01/30 0701 - 01/31 0700 In: 3047.5 [P.O.:360; I.V.:2375; Blood:312.5] Out: 1400 [Urine:1400] Intake/Output this shift:    Physical Exam:  General: Alert and oriented Abd: No CVAT GU: Urine is clear (blue from methylene blue administration during surgery)  Lab Results:  Recent Labs  03/06/13 0400 03/06/13 1320 03/07/13 0555  HGB 6.9* 8.1* 7.4*  HCT 22.4* 25.6* 23.7*   BMET  Recent Labs  03/06/13 0400 03/07/13 0555  NA 139 138  K 4.3 4.4  CL 104 105  CO2 23 22  GLUCOSE 128* 116*  BUN 33* 35*  CREATININE 3.44* 2.82*  CALCIUM 7.8* 7.9*     Studies/Results: US Renal  03/06/2013   CLINICAL DATA:  Bladder surgery, evaluate for hydronephrosis  EXAM: RENAL/URINARY TRACT ULTRASOUND COMPLETE  COMPARISON:  CT abdomen pelvis dated 03/02/2013  FINDINGS: Right Kidney:  Length: 16.2 cm. Mild fullness of the renal collecting system with upper pole caliectasis. No frank hydronephrosis.  Left Kidney:  Surgically absent.  Bladder:  Bladder decompressed by indwelling Foley catheter.  IMPRESSION: Mild fullness of the right renal collecting system without frank hydronephrosis.  Left kidney surgically absent.  Bladder decompressed by indwelling Foley catheter.  These results were called by telephone at the time of interpretation on 03/06/2013 at 0950 hours to Dr. Rolan Bucco , who verbally acknowledged these results.   Electronically Signed   By: Julian Hy M.D.   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  Urine is clear. 2) AKI: No evidence to suggest obstructive cause considering UOP and solitary kidney.  Renal function improving.  Continue Foley to monitor UOP considering recent renal dysfunction.   LOS: 5 days   Karleen Seebeck,LES 03/07/2013, 9:37 AM

## 2013-03-07 NOTE — Progress Notes (Signed)
ANTICOAGULATION CONSULT NOTE - Initial Consult  Pharmacy Consult for Warfarin Indication: Hx of PE/DVT  No Known Allergies  Patient Measurements: Height: 5\' 10"  (177.8 cm) Weight: 337 lb 1.3 oz (152.9 kg) IBW/kg (Calculated) : 73  Vital Signs: Temp: 98.7 F (37.1 C) (01/31 0500) Temp src: Oral (01/31 0500) BP: 127/61 mmHg (01/31 0500) Pulse Rate: 51 (01/31 0500)  Labs:  Recent Labs  03/05/13 0430 03/06/13 0400 03/06/13 1320 03/07/13 0555  HGB 7.5* 6.9* 8.1* 7.4*  HCT 24.2* 22.4* 25.6* 23.7*  PLT 242 266  --   --   LABPROT 30.7* 30.4*  --  25.2*  INR 3.08* 3.04*  --  2.38*  CREATININE 2.06* 3.44*  --  2.82*   Medical History: Past Medical History  Diagnosis Date  . Obesity   . Phlebitis     Lower extermity  . Pulmonary emboli 2008    submassive, saddle  . Prostate cancer 07/2009  . Sleep apnea     on CPAP  . Hx of echocardiogram 12/04/2010    Normal EF >55% no significant valve disease  . History of stress test 06/27/2009    Low risk and EF of approximately 50%  . DVT (deep venous thrombosis)   . Diabetes mellitus   . Chronic kidney disease, stage 3     baseline creatinine ~1.4  . HLD (hyperlipidemia)   . HTN (hypertension)    Medications:  Scheduled:  . cefTRIAXone (ROCEPHIN)  IV  1 g Intravenous Q24H  . docusate sodium  100 mg Oral BID  . ferrous sulfate  325 mg Oral TID WC  . finasteride  5 mg Oral QPM  . insulin aspart  0-20 Units Subcutaneous TID WC  . insulin aspart  0-5 Units Subcutaneous QHS  . niacin  1,000 mg Oral QHS  . senna  2 tablet Oral QHS  . tamsulosin  0.4 mg Oral BID  . Warfarin - Physician Dosing Inpatient   Does not apply q1800   Anti-infectives   Start     Dose/Rate Route Frequency Ordered Stop   03/07/13 0730  cefTRIAXone (ROCEPHIN) 1 g in dextrose 5 % 50 mL IVPB     1 g 100 mL/hr over 30 Minutes Intravenous Every 24 hours 03/07/13 0648     03/04/13 2200  ceFAZolin (ANCEF) IVPB 2 g/50 mL premix     2 g 100 mL/hr over 30  Minutes Intravenous 3 times per day 03/04/13 1706 03/05/13 0601   03/03/13 1420  ceFAZolin (ANCEF) 3 g in dextrose 5 % 50 mL IVPB     3 g 160 mL/hr over 30 Minutes Intravenous 30 min pre-op 03/03/13 1420 03/04/13 1335     Assessment: 14 yoM with prostate Ca, hematuria. Cysto, bladder biopsy, bladder fulguration 1/28. 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   Warfarin 5mg  daily ordered by urology from 1/28 with INR 2.85, one dose given 1/28  INR 1/29 and 1/30 > 3, contacted urology-plan hold Warfarin x 2 days  INR today 1/31 = 2.38, H/H 7.4/23.7  Hospitalist service taking over care from urology-ordered Warfarin per Pharmacy  Goal of Therapy:  INR 2-3   Plan:   Daily INR already ordered by MD-thank you  Warfarin 5mg  today @ 1800  Follow up daily CBC  Minda Ditto PharmD Pager [phone] 03/07/2013, 12:31 PM

## 2013-03-07 NOTE — Progress Notes (Signed)
Patients urine output noted to be dark red in color with no visible clots. Notified Short, MD (hospitalist) and Herrick,MD (urologist).  Received orders for CBI. Will continue to monitor patient. J.Liley Rake, RN

## 2013-03-08 DIAGNOSIS — I440 Atrioventricular block, first degree: Secondary | ICD-10-CM

## 2013-03-08 LAB — BASIC METABOLIC PANEL
BUN: 29 mg/dL — AB (ref 6–23)
CO2: 22 mEq/L (ref 19–32)
CREATININE: 1.93 mg/dL — AB (ref 0.50–1.35)
Calcium: 8 mg/dL — ABNORMAL LOW (ref 8.4–10.5)
Chloride: 107 mEq/L (ref 96–112)
GFR calc non Af Amer: 33 mL/min — ABNORMAL LOW (ref >90)
GFR, EST AFRICAN AMERICAN: 39 mL/min — AB (ref >90)
Glucose, Bld: 146 mg/dL — ABNORMAL HIGH (ref 70–99)
Potassium: 5 mEq/L (ref 3.7–5.3)
Sodium: 138 mEq/L (ref 137–147)

## 2013-03-08 LAB — CBC
HCT: 23.6 % — ABNORMAL LOW (ref 39.0–52.0)
HEMOGLOBIN: 7.3 g/dL — AB (ref 13.0–17.0)
MCH: 23.9 pg — AB (ref 26.0–34.0)
MCHC: 30.9 g/dL (ref 30.0–36.0)
MCV: 77.4 fL — AB (ref 78.0–100.0)
Platelets: 267 10*3/uL (ref 150–400)
RBC: 3.05 MIL/uL — ABNORMAL LOW (ref 4.22–5.81)
RDW: 17.5 % — ABNORMAL HIGH (ref 11.5–15.5)
WBC: 6.1 10*3/uL (ref 4.0–10.5)

## 2013-03-08 LAB — PROTIME-INR
INR: 1.98 — ABNORMAL HIGH (ref 0.00–1.49)
Prothrombin Time: 21.9 seconds — ABNORMAL HIGH (ref 11.6–15.2)

## 2013-03-08 MED ORDER — ISOSORB DINITRATE-HYDRALAZINE 20-37.5 MG PO TABS
1.0000 | ORAL_TABLET | Freq: Once | ORAL | Status: AC
  Administered 2013-03-08: 1 via ORAL
  Filled 2013-03-08: qty 1

## 2013-03-08 MED ORDER — CYANOCOBALAMIN 1000 MCG/ML IJ SOLN
1000.0000 ug | Freq: Once | INTRAMUSCULAR | Status: AC
  Filled 2013-03-08: qty 1

## 2013-03-08 MED ORDER — POLYETHYLENE GLYCOL 3350 17 G PO PACK
17.0000 g | PACK | Freq: Two times a day (BID) | ORAL | Status: DC
  Administered 2013-03-08: 17 g via ORAL
  Filled 2013-03-08 (×3): qty 1

## 2013-03-08 MED ORDER — ISOSORB DINITRATE-HYDRALAZINE 20-37.5 MG PO TABS
2.0000 | ORAL_TABLET | Freq: Two times a day (BID) | ORAL | Status: DC
  Administered 2013-03-08 – 2013-03-09 (×2): 2 via ORAL
  Filled 2013-03-08 (×3): qty 2

## 2013-03-08 MED ORDER — VITAMIN B-12 1000 MCG PO TABS
1000.0000 ug | ORAL_TABLET | Freq: Every day | ORAL | Status: DC
  Administered 2013-03-08 – 2013-03-12 (×5): 1000 ug via ORAL
  Filled 2013-03-08 (×5): qty 1

## 2013-03-08 MED ORDER — WARFARIN SODIUM 5 MG PO TABS
5.0000 mg | ORAL_TABLET | Freq: Once | ORAL | Status: AC
  Administered 2013-03-08: 5 mg via ORAL
  Filled 2013-03-08: qty 1

## 2013-03-08 NOTE — Progress Notes (Signed)
[facility] PROGRESS NOTE  [name] EAV:[id number] DOB: May 06, 1940 DOA: 03/02/2013 PCP: Melinda Crutch  Assessment/Plan  Gross hematuria, recurred last night, but appears to be slowing again - INR < 2 today and will probably trend down again tomorrow even if we redose warfarin - will redose warfarin today - agree with leaving catheter in today - Management per urology   Acute on chronic kidney injury, baseline CKD stage 3 with creatinine of ~1.4, likely secondary to dehydration from lasix and poor oral intake last few days. No obvious nephrotoxins or contrast exposure in the last few days.  Resolving with hydration.   - RUS without obstruction - FENa prerenal - d/c IVF and encourage oral intake today - Renally dose medications  - Minimize nephrotoxins   Hypotension likely secondary to dehydration and resolved with IVF.  HTN -  Continue to hold ACEI due to recent AKI -  Avoid norvasc due to LEE -  Avoid BB due to 1st degree block, bradycardia, and pauses -  Increase bidil to 2 tabs BID  Bradycardia with 1st degree AV block and having pauses of > 2 seconds on telemetry despite stopping BB.  Appears to have bigeminy which may be causing lack of conduction of next beat.  HR increases appropriately (>20bpm) with exertion and patient asymptomatic.  -  Will reconsult Cardiology to make sure he does not appear to be as risk for higher degree heart block/pacemaker  HLD, stable. 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  - Restart coumadin today -  Coumadin dosed per pharmacy   Severe OSA on CPAP   Perineal pain, may be related to recent bladder problems and procedures and resolving - RUS without obvious abnl to explain pain  - Continue prn oxycodone   Possible UTI -  Ceftriaxone -  F/u urine culture  Constipation -  Continue colace, senna -  Schedule miralax BID -  Pt aware he may ask for  bisacodyl prn  Acute blood loss anemia, iron deficiency anemia, and B12 deficiency anemia, hemoglobin improved after blood transfusion - Iron studies (and history) suggestive of iron deficiency, start iron supplements - B12 deficiency:  110 - consider work up for pernicious anemia as outpatient > defer to PCP - will give vitamin B12 injection and start daily supplementation - folate 1213 - SPEP/UPEP pending -  Defer TSH due to acute illness   Diet: diabetic  Access: PIV  IVF: yes  Proph: coumadin   Code Status: full code  Family Communication: patient alone  Disposition Plan:  No PT/OT f/u. Awaiting improvement in kidney function.     Consultants:  Urology  Procedures:  Cystoscopy with clot evacuation and bladder and urethral biopsies 1/28  Antibiotics:  Ceftriaxone 1/31 >>   HPI/Subjective:  States he is feeling okay.  Denies SOB, nausea.  No BM in several days.    Objective: Filed Vitals:   03/07/13 2150 03/07/13 2151 03/08/13 0459 03/08/13 0502  BP: 178/76 172/74 161/70   Pulse: 53 56 52   Temp: 98.7 F (37.1 C)  98.4 F (36.9 C)   TempSrc: Oral  Oral   Resp: 18  20   Height:      Weight:    154.6 kg (340 lb 13.3 oz)  SpO2: 98%  97%     Intake/Output Summary (Last 24 hours) at 03/08/13 1218 Last data filed at 03/08/13 0600  Gross  per 24 hour  Intake   8990 ml  Output  10700 ml  Net  -1710 ml   Filed Weights   03/02/13 1900 03/07/13 0500 03/08/13 0502  Weight: 152.2 kg (335 lb 8.6 oz) 152.9 kg (337 lb 1.3 oz) 154.6 kg (340 lb 13.3 oz)    Exam:   General:  [name], No acute distress  HEENT:  NCAT, MMM  Cardiovascular:  RRR, nl S1, S2 no mrg, 2+ pulses, warm extremities  Respiratory:  CTAB, no increased WOB  Abdomen:   NABS, soft, NT/ND  MSK:   Normal tone and bulk, trace bilateral LEE  Neuro:  Grossly intact GU:  Urine in bag is rusty  Data Reviewed: Basic Metabolic Panel:  Recent Labs Lab 03/04/13 0505 03/05/13 0430  03/06/13 0400 03/07/13 0555 03/08/13 0550  NA 140 139 139 138 138  K 4.5 4.5 4.3 4.4 5.0  CL 106 105 104 105 107  CO2 25 23 23 22 22   GLUCOSE 154* 127* 128* 116* 146*  BUN 19 22 33* 35* 29*  CREATININE 1.50* 2.06* 3.44* 2.82* 1.93*  CALCIUM 8.0* 7.5* 7.8* 7.9* 8.0*   Liver Function Tests: No results found for this basename: AST, ALT, ALKPHOS, BILITOT, PROT, ALBUMIN,  in the last 168 hours No results found for this basename: LIPASE, AMYLASE,  in the last 168 hours No results found for this basename: AMMONIA,  in the last 168 hours CBC:  Recent Labs Lab 03/03/13 0435 03/04/13 0505 03/05/13 0430 03/06/13 0400 03/06/13 1320 03/07/13 0555 03/08/13 0550  WBC 6.0 5.8 6.4 7.3  --   --  6.1  HGB 9.1* 7.9* 7.5* 6.9* 8.1* 7.4* 7.3*  HCT 29.6* 25.9* 24.2* 22.4* 25.6* 23.7* 23.6*  MCV 75.3* 76.0* 77.1* 77.0*  --   --  77.4*  PLT 331 290 242 266  --   --  267   Cardiac Enzymes: No results found for this basename: CKTOTAL, CKMB, CKMBINDEX, TROPONINI,  in the last 168 hours BNP (last 3 results) No results found for this basename: PROBNP,  in the last 8760 hours CBG:  Recent Labs Lab 03/06/13 0902 03/06/13 1123 03/06/13 1633 03/06/13 2230 03/07/13 0746  GLUCAP 124* 124* 122* 166* 112*    Recent Results (from the past 240 hour(s))  SURGICAL PCR SCREEN     Status: None   Collection Time    03/03/13  3:58 PM      Result Value Range Status   MRSA, PCR NEGATIVE  NEGATIVE Final   Staphylococcus aureus NEGATIVE  NEGATIVE Final   Comment:            The Xpert SA Assay (FDA     approved for NASAL specimens     in patients over 53 years of age),     is one component of     a comprehensive surveillance     program.  Test performance has     been validated by [employer] for patients greater     than or equal to 26 year old.     It is not intended     to diagnose infection nor to     guide or monitor treatment.     Studies: No results found.  Scheduled Meds: .  cefTRIAXone (ROCEPHIN)  IV  1 g Intravenous Q24H  . docusate sodium  100 mg Oral BID  . ferrous sulfate  325 mg Oral TID WC  . finasteride  5 mg Oral QPM  .  insulin aspart  0-20 Units Subcutaneous TID WC  . insulin aspart  0-5 Units Subcutaneous QHS  . isosorbide-hydrALAZINE  1 tablet Oral BID  . niacin  1,000 mg Oral QHS  . senna  2 tablet Oral QHS  . tamsulosin  0.4 mg Oral BID  . Warfarin - Pharmacist Dosing Inpatient   Does not apply q1800   Continuous Infusions: . sodium chloride 10 mL/hr (03/08/13 0738)  . sodium chloride irrigation      Principal Problem:   Acute-on-chronic kidney injury, baseline CKD stage 3 Active Problems:   Chronic anticoagulation   History of pulmonary embolism   Essential hypertension   Gross hematuria   Preoperative clearance   Acute blood loss anemia   Hypotension, unspecified   Bradycardia   Perineal pain   1St degree AV block    Time spent: 30 min    Zaryiah Barz, [facility] Pager [phone]. If 7PM-7AM, please contact night-coverage at [email or web address], password [facility] 03/08/2013, 12:18 PM  LOS: 6 days

## 2013-03-08 NOTE — Progress Notes (Signed)
Patient ID: [name], male   DOB: 04-Jul-1940, 73 y.o.   MRN: [id number]  4 Days Post-Op Subjective: Pt developed recurrent gross hematuria yesterday and CBI was restarted.  Now CBI has again been able to be titrated off.  Objective: Vital signs in last 24 hours: Temp:  [98.4 F (36.9 C)-98.7 F (37.1 C)] 98.4 F (36.9 C) (02/01 0459) Pulse Rate:  [52-56] 52 (02/01 0459) Resp:  [18-20] 20 (02/01 0459) BP: (141-178)/(61-76) 161/70 mmHg (02/01 0459) SpO2:  [97 %-98 %] 97 % (02/01 0459) Weight:  [154.6 kg (340 lb 13.3 oz)] 154.6 kg (340 lb 13.3 oz) (02/01 0502)  Intake/Output from previous day: 01/31 0701 - 02/01 0700 In: 9985.4 [P.O.:1200; I.V.:1485.4; IV Piggyback:100] Out: 10700 [WOEHO:12248] Intake/Output this shift:    Physical Exam:  General: Alert and oriented GU: Urine currently clear in catheter tubing with CBI off.  Lab Results:  Recent Labs  03/06/13 1320 03/07/13 0555 03/08/13 0550  HGB 8.1* 7.4* 7.3*  HCT 25.6* 23.7* 23.6*   BMET  Recent Labs  03/07/13 0555 03/08/13 0550  NA 138 138  K 4.4 5.0  CL 105 107  CO2 22 22  GLUCOSE 116* 146*  BUN 35* 29*  CREATININE 2.82* 1.93*  CALCIUM 7.9* 8.0*     Studies/Results: No results found.  Assessment/Plan: 1) AKI: Renal function improving.  Continue to monitor.  No evidence to suggest obstruction of solitary kidney.  2) Hematuria: Will continue catheter but leave CBI off.  Agree with holding Coumadin today.  May be able to have voiding trial in hospital tomorrow vs keeping catheter while restarting anticoagulation.  Will need to assess risks/benefits more definitively to make a decision about restarting anticoagulation depending on how he does over next 24 hrs.   LOS: 6 days   Arlando Leisinger,LES 03/08/2013, 9:31 AM

## 2013-03-08 NOTE — Progress Notes (Signed)
ANTICOAGULATION CONSULT NOTE - Follow Up  Pharmacy Consult for Warfarin Indication: Hx of PE/DVT  Patient Measurements: Height: 5\' 10"  (177.8 cm) Weight: 340 lb 13.3 oz (154.6 kg) IBW/kg (Calculated) : 73  Labs:  Recent Labs  03/06/13 0400 03/06/13 1320 03/07/13 0555 03/08/13 0550  HGB 6.9* 8.1* 7.4* 7.3*  HCT 22.4* 25.6* 23.7* 23.6*  PLT 266  --   --  267  LABPROT 30.4*  --  25.2* 21.9*  INR 3.04*  --  2.38* 1.98*  CREATININE 3.44*  --  2.82* 1.93*   Medications:  Scheduled:  . cefTRIAXone (ROCEPHIN)  IV  1 g Intravenous Q24H  . cyanocobalamin  1,000 mcg Intramuscular Once  . docusate sodium  100 mg Oral BID  . ferrous sulfate  325 mg Oral TID WC  . finasteride  5 mg Oral QPM  . insulin aspart  0-20 Units Subcutaneous TID WC  . insulin aspart  0-5 Units Subcutaneous QHS  . isosorbide-hydrALAZINE  1 tablet Oral Once  . isosorbide-hydrALAZINE  2 tablet Oral BID  . niacin  1,000 mg Oral QHS  . polyethylene glycol  17 g Oral BID  . senna  2 tablet Oral QHS  . tamsulosin  0.4 mg Oral BID  . vitamin B-12  1,000 mcg Oral Daily  . Warfarin - Pharmacist Dosing Inpatient   Does not apply q1800   Assessment: 61 yoM with prostate Ca, hematuria. Cysto, bladder biopsy, bladder fulguration 1/28. 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   Warfarin 5mg  daily ordered by urology from 1/28 with INR 2.85, one dose given 1/28  INR 1/29 and 1/30 > 3, contacted urology-planned to hold Warfarin x 2 days  Hospitalist service taking over care from urology 1/31-ordered Warfarin per Pharmacy  Warfarin 5mg  ordered 1/31, held for hematuria, now resolved and plan to give Warfarin today with INR 1.98  H/H 7.3/ 23.6, Plt 267, low stable  Goal of Therapy:  INR 2-3   Plan:   Warfarin 5mg  today @ 1800  Daily INR already ordered by MD-thank you  Follow up daily CBC  Thank you for the consult,  Minda Ditto PharmD Pager [phone] 03/08/2013, 12:39  PM

## 2013-03-08 NOTE — Progress Notes (Signed)
In past 11 hours able to slow cbi down and keep urine brownish/blur, clear.  Cbi in 1200 and ouotput has been 3650.

## 2013-03-09 ENCOUNTER — Inpatient Hospital Stay (HOSPITAL_COMMUNITY): Payer: BC Managed Care – PPO

## 2013-03-09 ENCOUNTER — Telehealth: Payer: Self-pay | Admitting: *Deleted

## 2013-03-09 DIAGNOSIS — I872 Venous insufficiency (chronic) (peripheral): Secondary | ICD-10-CM

## 2013-03-09 LAB — TYPE AND SCREEN
ABO/RH(D): A POS
Antibody Screen: NEGATIVE
UNIT DIVISION: 0
UNIT DIVISION: 0

## 2013-03-09 LAB — GLUCOSE, CAPILLARY
GLUCOSE-CAPILLARY: 152 mg/dL — AB (ref 70–99)
GLUCOSE-CAPILLARY: 138 mg/dL — AB (ref 70–99)
GLUCOSE-CAPILLARY: 130 mg/dL — AB (ref 70–99)
Glucose-Capillary: 130 mg/dL — ABNORMAL HIGH (ref 70–99)
Glucose-Capillary: 142 mg/dL — ABNORMAL HIGH (ref 70–99)
Glucose-Capillary: 158 mg/dL — ABNORMAL HIGH (ref 70–99)
Glucose-Capillary: 121 mg/dL — ABNORMAL HIGH (ref 70–99)
Glucose-Capillary: 125 mg/dL — ABNORMAL HIGH (ref 70–99)
Glucose-Capillary: 130 mg/dL — ABNORMAL HIGH (ref 70–99)
Glucose-Capillary: 134 mg/dL — ABNORMAL HIGH (ref 70–99)
Glucose-Capillary: 133 mg/dL — ABNORMAL HIGH (ref 70–99)
Glucose-Capillary: 161 mg/dL — ABNORMAL HIGH (ref 70–99)

## 2013-03-09 LAB — PREPARE RBC (CROSSMATCH)

## 2013-03-09 LAB — URINE CULTURE: Colony Count: 85000

## 2013-03-09 LAB — CBC
HEMATOCRIT: 22 % — AB (ref 39.0–52.0)
Hemoglobin: 6.9 g/dL — CL (ref 13.0–17.0)
MCH: 24.3 pg — ABNORMAL LOW (ref 26.0–34.0)
MCHC: 31.4 g/dL (ref 30.0–36.0)
MCV: 77.5 fL — ABNORMAL LOW (ref 78.0–100.0)
Platelets: 282 10*3/uL (ref 150–400)
RBC: 2.84 MIL/uL — AB (ref 4.22–5.81)
RDW: 17.7 % — ABNORMAL HIGH (ref 11.5–15.5)
WBC: 5.5 10*3/uL (ref 4.0–10.5)

## 2013-03-09 LAB — BASIC METABOLIC PANEL
BUN: 29 mg/dL — ABNORMAL HIGH (ref 6–23)
CHLORIDE: 107 meq/L (ref 96–112)
CO2: 23 mEq/L (ref 19–32)
Calcium: 8.2 mg/dL — ABNORMAL LOW (ref 8.4–10.5)
Creatinine, Ser: 1.78 mg/dL — ABNORMAL HIGH (ref 0.50–1.35)
GFR calc Af Amer: 43 mL/min — ABNORMAL LOW (ref >90)
GFR, EST NON AFRICAN AMERICAN: 37 mL/min — AB (ref >90)
GLUCOSE: 140 mg/dL — AB (ref 70–99)
POTASSIUM: 5.1 meq/L (ref 3.7–5.3)
SODIUM: 140 meq/L (ref 137–147)

## 2013-03-09 LAB — HEMOGLOBIN AND HEMATOCRIT, BLOOD
HCT: 26.1 % — ABNORMAL LOW (ref 39.0–52.0)
HEMOGLOBIN: 8.3 g/dL — AB (ref 13.0–17.0)

## 2013-03-09 LAB — PROTIME-INR
INR: 1.93 — ABNORMAL HIGH (ref 0.00–1.49)
Prothrombin Time: 21.5 seconds — ABNORMAL HIGH (ref 11.6–15.2)

## 2013-03-09 MED ORDER — HYDRALAZINE HCL 25 MG PO TABS
25.0000 mg | ORAL_TABLET | Freq: Three times a day (TID) | ORAL | Status: DC
  Administered 2013-03-09 (×2): 25 mg via ORAL
  Filled 2013-03-09 (×5): qty 1

## 2013-03-09 MED ORDER — POLYETHYLENE GLYCOL 3350 17 G PO PACK
17.0000 g | PACK | Freq: Every day | ORAL | Status: DC | PRN
  Filled 2013-03-09: qty 1

## 2013-03-09 MED ORDER — ISOSORB DINITRATE-HYDRALAZINE 20-37.5 MG PO TABS: 2.0000 | ORAL_TABLET | Freq: Three times a day (TID) | ORAL | Status: DC

## 2013-03-09 MED ORDER — CARVEDILOL 3.125 MG PO TABS
3.1250 mg | ORAL_TABLET | Freq: Two times a day (BID) | ORAL | Status: DC
  Administered 2013-03-09 – 2013-03-12 (×6): 3.125 mg via ORAL
  Filled 2013-03-09 (×8): qty 1

## 2013-03-09 MED ORDER — HYDRALAZINE HCL 20 MG/ML IJ SOLN
10.0000 mg | INTRAMUSCULAR | Status: DC | PRN
  Administered 2013-03-10 – 2013-03-11 (×3): 10 mg via INTRAVENOUS
  Filled 2013-03-09 (×3): qty 1

## 2013-03-09 MED ORDER — CIPROFLOXACIN IN D5W 400 MG/200ML IV SOLN
400.0000 mg | Freq: Two times a day (BID) | INTRAVENOUS | Status: DC
  Administered 2013-03-09 – 2013-03-12 (×6): 400 mg via INTRAVENOUS
  Filled 2013-03-09 (×7): qty 200

## 2013-03-09 MED ORDER — WARFARIN SODIUM 5 MG PO TABS
5.0000 mg | ORAL_TABLET | Freq: Once | ORAL | Status: AC
  Administered 2013-03-09: 5 mg via ORAL
  Filled 2013-03-09: qty 1

## 2013-03-09 NOTE — Progress Notes (Signed)
Pelvic US shows residual of 614cc.  I placed a 22Fr 3-way coude tip cath using sterile technique w/ ease; 10 cc in the balloon.  Urine returned clear blue without blood.

## 2013-03-09 NOTE — Progress Notes (Signed)
At 0347pt had a 2.47 pause and then returned to his usual rythym of SB/SR First degree HB and PVC's.  Pt has been having pauses for during this admission and MD is aware.  Pauses are less frequent.  Beta Blockers have been dc'd.  VSS, pt is aysmptomatic.   Will continue to monitor.  Strip saved in computer and chart.  CTM reported this a Type II 2 but that was incorrect and [name] has revised her report.

## 2013-03-09 NOTE — Progress Notes (Signed)
ANTICOAGULATION CONSULT NOTE - Follow Up  Pharmacy Consult for Warfarin Indication: Hx of PE/DVT  Patient Measurements: Height: 5\' 10"  (177.8 cm) Weight: 337 lb 12.8 oz (153.225 kg) IBW/kg (Calculated) : 73  Labs:  Recent Labs  03/07/13 0555 03/08/13 0550 03/09/13 0459  HGB 7.4* 7.3* 6.9*  HCT 23.7* 23.6* 22.0*  PLT  --  267 282  LABPROT 25.2* 21.9* 21.5*  INR 2.38* 1.98* 1.93*  CREATININE 2.82* 1.93* 1.78*   Medications:  Scheduled:  . carvedilol  3.125 mg Oral BID WC  . cefTRIAXone (ROCEPHIN)  IV  1 g Intravenous Q24H  . docusate sodium  100 mg Oral BID  . ferrous sulfate  325 mg Oral TID WC  . finasteride  5 mg Oral QPM  . hydrALAZINE  25 mg Oral TID  . insulin aspart  0-20 Units Subcutaneous TID WC  . insulin aspart  0-5 Units Subcutaneous QHS  . niacin  1,000 mg Oral QHS  . senna  2 tablet Oral QHS  . tamsulosin  0.4 mg Oral BID  . vitamin B-12  1,000 mcg Oral Daily  . Warfarin - Pharmacist Dosing Inpatient   Does not apply q1800   Assessment: 85 yoM with prostate Ca, hematuria. Cysto, bladder biopsy, bladder fulguration 1/28. On chronic warfarin for hx of PE/DVT, home dose 7.5mg  daily except 5mg  Mon,Thur.   Warfarin 5mg  daily ordered by urology from 1/28 with INR 2.85, one dose given 1/28  INR 1/29 and 1/30 > 3, contacted urology-planned to hold Warfarin x 2 days  Hospitalist service taking over care from urology 1/31-ordered Warfarin per Pharmacy  Warfarin 5mg  ordered 1/31, held for hematuria, now resolved and Warfarin resumed 2/1 with INR 1.98  H/H 6.9, Plt 282.  Received 2 units of PRBC this AM following Hgb result.  Low Hgb 2/2 ABLA, iron deficiency and B12 deficiency.  Iron and B12 supplementation ordered.  INR 1.93 today.  Goal of Therapy:  INR 2-3   Plan:   Warfarin 5mg  today @ 1800.  Daily INR.  Hershal Coria, PharmD, BCPS Pager: [phone] 03/09/2013 1:37 PM

## 2013-03-09 NOTE — Progress Notes (Signed)
Urine has been tea color and clear all shift and patent.  Pt complained of "that feeling of being obstructed" this AM.  I got an order to hand irrigate PRN and received small amount of dark/brownish clot return after which I obtained 300 cc's of tea colored urine and reports of relief from pt. Not active bleeding noted and no other clots noted this shift.  HGB is 6.9 this AM (was 7.3)  [facility] on call and notified.  Will continue to monitor.

## 2013-03-09 NOTE — Telephone Encounter (Signed)
Spoke with [facility] today in reference to the status of patient's new sleep machine. I was informed by Ivin Booty that she just received approval from patient's secondary insurance company today. I informed her the patient is currently in the hospital and she asked that I have him to contact her once he is discharged. I called Dr. Sallyanne Kuster @ hospital to notify him of this. He will inform patient.

## 2013-03-09 NOTE — Progress Notes (Signed)
Pt is using home CPAP machine and mask. Pt states he does not require any assistance with application and sterile water has already been added for humidification. RT will continue to monitor as needed.

## 2013-03-09 NOTE — Progress Notes (Signed)
ANTIBIOTIC CONSULT NOTE - INITIAL  Pharmacy Consult for Cipro Indication: UTI  No Known Allergies  Patient Measurements: Height: 5\' 10"  (177.8 cm) Weight: 337 lb 12.8 oz (153.225 kg) IBW/kg (Calculated) : 73  Vital Signs: Temp: 98.7 F (37.1 C) (02/02 1345) Temp src: Oral (02/02 1345) BP: 166/76 mmHg (02/02 1345) Pulse Rate: 52 (02/02 1345) Intake/Output from previous day: 02/01 0701 - 02/02 0700 In: 2106.2 [P.O.:1560; I.V.:396.2; IV Piggyback:50] Out: 2100 [Urine:2100] Intake/Output from this shift: Total I/O In: 612.5 [P.O.:600; Blood:12.5] Out: 350 [Urine:350]  Labs:  Recent Labs  03/06/13 1748 03/07/13 0555 03/08/13 0550 03/09/13 0459  WBC  --   --  6.1 5.5  HGB  --  7.4* 7.3* 6.9*  PLT  --   --  267 282  LABCREA 139.5  --   --   --   CREATININE  --  2.82* 1.93* 1.78*   Estimated Creatinine Clearance: 56.6 ml/min (by C-G formula based on Cr of 1.78). No results found for this basename: Letta Median, VANCORANDOM, GENTTROUGH, GENTPEAK, GENTRANDOM, TOBRATROUGH, TOBRAPEAK, TOBRARND, AMIKACINPEAK, AMIKACINTROU, AMIKACIN,  in the last 72 hours   Microbiology: 1/30 urine culture: Pseudomonas aeruginosa (pan-sensitive)   Assessment: 54 yoM with prostate Ca, hematuria s/p cysto, bladder biopsy, bladder fulguration 1/28.  Patient was empirically placed on Ceftriaxone for UTI.  Urine cultures now resulted growing Pseudomonas (pan-sensitive) so switching to Cipro for treatment of Pseudomonas UTI.  Afebrile, WBC WNL  Renal function variable this admission but improving.  SCr 1.78, CrCl~80 ml/min (CG)  Note patient is on chronic warfarin therapy for hx of DVT/PE.  Cipro interacts with warfarin, causing increased INR.  Pharmacy managing warfarin and will be adjusting warfarin doses accordingly.   Goal of Therapy:  Eradication of infection, doses adjusted per renal clearance  Plan:  1.  Cipro 400 mg IV q12h.  Will switch to PO once patient demonstrates  improvement per MD assessment and continues to remain afebrile x 24 hours (possibly as early as tomorrow since patient tolerating oral medications and diet). 2.  F/u SCr for dose adjustments, clinical course.  Hershal Coria 03/09/2013,2:31 PM

## 2013-03-09 NOTE — Consult Note (Signed)
Reason for Consult: Bradycardia, pauses  Requesting Physician: Short   Cardiologist: Corky Downs  HPI: This is a 73 y.o. male with a past medical history significant for severe OSA, left renal cell carcinoma grade 3 pT2 s/p right nephrectomy 1999 in remission, hx of DVT and submassive saddle PE (blood pressure was stable despite size of thrombus) due to inactivity in 2008 on lifelong anticoagulation with coumadin, HTN, HLD, IDDM, CKD stage 3, prostate cancer, who presented with hematuria with clots causing intermittent urinary retention.  While on telemetry he has had frequent sinus pauses, up to 2 seconds in duration and frequent bradycardia, down to low 40s. These episodes occur randomly both during the day and during the night. When he is awake and active, his heart rate increases appropriately. All the bradycardia episodes have been asymptomatic.  He is using a "loaner" BiPAP machine, since his older equipment is broken. He believes the settings are "not the right ones" and has had worsening symptoms of OSA. He falls asleep constantly throughout the day. He needs a new sleep apnea titration study, but this has been delayed by administrative issues.  His carvedilol dose was increased in December.  His beta blocker has been discontinued and the bradycardia episodes have become less prevalent. Reportedly, asymptomatic bradycardia has been a chronic problem.  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  PMHx:  Past Medical History  Diagnosis Date  . Obesity   . Phlebitis     Lower extermity  . Pulmonary emboli 2008    submassive, saddle  . Prostate cancer 07/2009  . Sleep apnea     on CPAP  . Hx of echocardiogram 12/04/2010    Normal EF >55% no significant valve disease  . History of stress test 06/27/2009    Low risk and EF of approximately 50%  . DVT (deep venous thrombosis)   . Diabetes mellitus   .  Chronic kidney disease, stage 3     baseline creatinine ~1.4  . HLD (hyperlipidemia)   . HTN (hypertension)    Past Surgical History  Procedure Laterality Date  . Nephrectomy  1999    for CA  . Cholecystectomy  1999  . Transurethral resection of bladder tumor N/A 03/04/2013    Procedure: CYSTOSCOPY WITH RIGHT RETROGRADE PYELOGRAM AND BLADDER BIOPSY /CLOT EVACUATION/ BIOPSY PROSTATIC URETHRA WITH FULGERATION ;  Surgeon: Molli Hazard, MD;  Location: [facility];  Service: Urology;  Laterality: N/A;    FAMHx: Family History  Problem Relation Age of Onset  . Cancer Mother   . Diabetes Father   . Cancer Father     SOCHx:  reports that he quit smoking about 65 years ago. He has never used smokeless tobacco. He reports that he drinks alcohol. He reports that he does not use illicit drugs.  ALLERGIES: No Known Allergies  ROS:  He is constantly sleepy. He denies syncope or presyncope. General: Denies fevers, chills, weight loss or gain  HEENT: Denies changes to hearing and vision, rhinorrhea, sinus congestion, sore throat  CV: Denies chest pain and palpitations, chronic lower extremity edema is stable  PULM: Denies SOB, wheezing, cough.  GI: Denies nausea, vomiting, constipation, diarrhea.  GU: Foley in place  ENDO: Denies polyuria, polydipsia.  HEME: Denies hematemesis, blood in stools, melena, abnormal bruising or bleeding.  LYMPH: Denies lymphadenopathy.  MSK: Having severe perineal pain DERM: Denies skin rash or ulcer.  NEURO: Denies focal numbness, weakness, slurred  speech, confusion, facial droop.  PSYCH: Denies anxiety and depression.    HOME MEDICATIONS: Prescriptions prior to admission  Medication Sig Dispense Refill  . alendronate (FOSAMAX) 70 MG tablet Take 1 tablet by mouth every Monday.       Marland Kitchen aspirin 81 MG tablet Take 81 mg by mouth at bedtime.       . carvedilol (COREG) 12.5 MG tablet Take 12.5 mg by mouth 2 (two) times daily with a meal.      .  fenofibrate (TRICOR) 145 MG tablet Take 145 mg by mouth daily.       . ferrous sulfate 325 (65 FE) MG EC tablet Take 325 mg by mouth daily.      . finasteride (PROSCAR) 5 MG tablet Take 5 mg by mouth every evening.      . fish oil-omega-3 fatty acids 1000 MG capsule Take 1 g by mouth 2 (two) times daily.       . furosemide (LASIX) 40 MG tablet Take 40 mg by mouth daily.      Marland Kitchen glucosamine-chondroitin 500-400 MG tablet Take 1,500 tablets by mouth 2 (two) times daily.      . Multiple Vitamin (MULTIVITAMIN WITH MINERALS) TABS tablet Take 1 tablet by mouth daily.      . naproxen sodium (ANAPROX) 220 MG tablet Take 220 mg by mouth 2 (two) times daily with a meal.      . Niacin CR 1000 MG TBCR Take 1,000 mg by mouth at bedtime.       Marland Kitchen NOVOLOG MIX 70/30 (70-30) 100 UNIT/ML injection Inject 70 Units into the skin 2 (two) times daily.       . potassium chloride (K-DUR,KLOR-CON) 10 MEQ tablet Take 10 mEq by mouth daily.      . quinapril (ACCUPRIL) 40 MG tablet Take 40 mg by mouth 2 (two) times daily.       . tamsulosin (FLOMAX) 0.4 MG CAPS capsule Take 0.4 mg by mouth 2 (two) times daily.      Marland Kitchen warfarin (COUMADIN) 5 MG tablet Take 5-7.5 mg by mouth daily. 5 mg on Monday and Thursday and 7.5 mg all other days      . tamsulosin (FLOMAX) 0.4 MG CAPS capsule Take 0.4 mg by mouth daily.         HOSPITAL MEDICATIONS: I have reviewed the patient's current medications. Prior to Admission:  Prescriptions prior to admission  Medication Sig Dispense Refill  . alendronate (FOSAMAX) 70 MG tablet Take 1 tablet by mouth every Monday.       Marland Kitchen aspirin 81 MG tablet Take 81 mg by mouth at bedtime.       . carvedilol (COREG) 12.5 MG tablet Take 12.5 mg by mouth 2 (two) times daily with a meal.      . fenofibrate (TRICOR) 145 MG tablet Take 145 mg by mouth daily.       . ferrous sulfate 325 (65 FE) MG EC tablet Take 325 mg by mouth daily.      . finasteride (PROSCAR) 5 MG tablet Take 5 mg by mouth every evening.        . fish oil-omega-3 fatty acids 1000 MG capsule Take 1 g by mouth 2 (two) times daily.       . furosemide (LASIX) 40 MG tablet Take 40 mg by mouth daily.      Marland Kitchen glucosamine-chondroitin 500-400 MG tablet Take 1,500 tablets by mouth 2 (two) times daily.      . Multiple Vitamin (  MULTIVITAMIN WITH MINERALS) TABS tablet Take 1 tablet by mouth daily.      . naproxen sodium (ANAPROX) 220 MG tablet Take 220 mg by mouth 2 (two) times daily with a meal.      . Niacin CR 1000 MG TBCR Take 1,000 mg by mouth at bedtime.       Marland Kitchen NOVOLOG MIX 70/30 (70-30) 100 UNIT/ML injection Inject 70 Units into the skin 2 (two) times daily.       . potassium chloride (K-DUR,KLOR-CON) 10 MEQ tablet Take 10 mEq by mouth daily.      . quinapril (ACCUPRIL) 40 MG tablet Take 40 mg by mouth 2 (two) times daily.       . tamsulosin (FLOMAX) 0.4 MG CAPS capsule Take 0.4 mg by mouth 2 (two) times daily.      Marland Kitchen warfarin (COUMADIN) 5 MG tablet Take 5-7.5 mg by mouth daily. 5 mg on Monday and Thursday and 7.5 mg all other days      . tamsulosin (FLOMAX) 0.4 MG CAPS capsule Take 0.4 mg by mouth daily.        Scheduled: . cefTRIAXone (ROCEPHIN)  IV  1 g Intravenous Q24H  . docusate sodium  100 mg Oral BID  . ferrous sulfate  325 mg Oral TID WC  . finasteride  5 mg Oral QPM  . insulin aspart  0-20 Units Subcutaneous TID WC  . insulin aspart  0-5 Units Subcutaneous QHS  . isosorbide-hydrALAZINE  2 tablet Oral BID  . niacin  1,000 mg Oral QHS  . polyethylene glycol  17 g Oral BID  . senna  2 tablet Oral QHS  . tamsulosin  0.4 mg Oral BID  . vitamin B-12  1,000 mcg Oral Daily  . Warfarin - Pharmacist Dosing Inpatient   Does not apply q1800    VITALS: Blood pressure 175/68, pulse 52, temperature 98.2 F (36.8 C), temperature source Oral, resp. rate 18, height 5\' 10"  (1.778 m), weight 153.225 kg (337 lb 12.8 oz), SpO2 95.00%.  PHYSICAL EXAM: General appearance: morbidly obese and falls asleep repeatedly during interview Neck: no  adenopathy, no carotid bruit, supple, symmetrical, trachea midline, thyroid not enlarged, symmetric, no tenderness/mass/nodules and cannot see the JVP. Crowded oropharynx, Mallampati 3 Lungs: clear to auscultation bilaterally Heart: regular rate and rhythm with occasional ectopy, S1, S2 normal, no click, rub or gallop, 1/6 systolic ejection murmur Abdomen: limited exam due to obesity, no obvious abnormality Extremities: leg edema with chronic changes, 3+ symmetrical to knees; bilateral large varicose veins Pulses: 2+ and symmetric Neurologic: Once he is awakened he is alert and oriented X 3, normal strength and tone. Normal symmetric reflexes. Normal coordination and gait.  LABS: Results for orders placed during the hospital encounter of 03/02/13 (from the past 48 hour(s))  BASIC METABOLIC PANEL     Status: Abnormal   Collection Time    03/08/13  5:50 AM      Result Value Range   Sodium 138  137 - 147 mEq/L   Potassium 5.0  3.7 - 5.3 mEq/L   Chloride 107  96 - 112 mEq/L   CO2 22  19 - 32 mEq/L   Glucose, Bld 146 (*) 70 - 99 mg/dL   BUN 29 (*) 6 - 23 mg/dL   Creatinine, Ser 05/06/13 (*) 0.50 - 1.35 mg/dL   Calcium 8.0 (*) 8.4 - 10.5 mg/dL   GFR calc non Af Amer 33 (*) >90 mL/min   GFR calc Af Amer 39 (*) >90  mL/min   Comment: (NOTE)     The eGFR has been calculated using the CKD EPI equation.     This calculation has not been validated in all clinical situations.     eGFR's persistently <90 mL/min signify possible Chronic Kidney     Disease.  PROTIME-INR     Status: Abnormal   Collection Time    03/08/13  5:50 AM      Result Value Range   Prothrombin Time 21.9 (*) 11.6 - 15.2 seconds   INR 1.98 (*) 0.00 - 1.49  CBC     Status: Abnormal   Collection Time    03/08/13  5:50 AM      Result Value Range   WBC 6.1  4.0 - 10.5 K/uL   RBC 3.05 (*) 4.22 - 5.81 MIL/uL   Hemoglobin 7.3 (*) 13.0 - 17.0 g/dL   HCT 23.6 (*) 39.0 - 52.0 %   MCV 77.4 (*) 78.0 - 100.0 fL   MCH 23.9 (*) 26.0 -  34.0 pg   MCHC 30.9  30.0 - 36.0 g/dL   RDW 17.5 (*) 11.5 - 15.5 %   Platelets 267  150 - 400 K/uL  BASIC METABOLIC PANEL     Status: Abnormal   Collection Time    03/09/13  4:59 AM      Result Value Range   Sodium 140  137 - 147 mEq/L   Potassium 5.1  3.7 - 5.3 mEq/L   Chloride 107  96 - 112 mEq/L   CO2 23  19 - 32 mEq/L   Glucose, Bld 140 (*) 70 - 99 mg/dL   BUN 29 (*) 6 - 23 mg/dL   Creatinine, Ser 1.78 (*) 0.50 - 1.35 mg/dL   Calcium 8.2 (*) 8.4 - 10.5 mg/dL   GFR calc non Af Amer 37 (*) >90 mL/min   GFR calc Af Amer 43 (*) >90 mL/min   Comment: (NOTE)     The eGFR has been calculated using the CKD EPI equation.     This calculation has not been validated in all clinical situations.     eGFR's persistently <90 mL/min signify possible Chronic Kidney     Disease.  PROTIME-INR     Status: Abnormal   Collection Time    03/09/13  4:59 AM      Result Value Range   Prothrombin Time 21.5 (*) 11.6 - 15.2 seconds   INR 1.93 (*) 0.00 - 1.49  CBC     Status: Abnormal   Collection Time    03/09/13  4:59 AM      Result Value Range   WBC 5.5  4.0 - 10.5 K/uL   RBC 2.84 (*) 4.22 - 5.81 MIL/uL   Hemoglobin 6.9 (*) 13.0 - 17.0 g/dL   Comment: REPEATED TO VERIFY     CRITICAL RESULT CALLED TO, READ BACK BY AND VERIFIED WITH:     KLOCKWOOD RN AT 0540 ON [id number] BY DLONG   HCT 22.0 (*) 39.0 - 52.0 %   MCV 77.5 (*) 78.0 - 100.0 fL   MCH 24.3 (*) 26.0 - 34.0 pg   MCHC 31.4  30.0 - 36.0 g/dL   RDW 17.7 (*) 11.5 - 15.5 %   Platelets 282  150 - 400 K/uL  PREPARE RBC (CROSSMATCH)     Status: None   Collection Time    03/09/13  7:00 AM      Result Value Range   Order Confirmation ORDER PROCESSED BY BLOOD BANK  PREPARE RBC (CROSSMATCH)     Status: None   Collection Time    03/09/13  7:00 AM      Result Value Range   Order Confirmation ORDER PROCESSED BY BLOOD BANK    TYPE AND SCREEN     Status: None   Collection Time    03/09/13  7:54 AM      Result Value Range   ABO/RH(D) A POS      Antibody Screen NEG     Sample Expiration 03/12/2013     Unit Number [id number]     Blood Component Type RED CELLS,LR     Unit division 00     Status of Unit ISSUED     Transfusion Status OK TO TRANSFUSE     Crossmatch Result Compatible     Unit Number [id number]     Blood Component Type RED CELLS,LR     Unit division 00     Status of Unit ALLOCATED     Transfusion Status OK TO TRANSFUSE     Crossmatch Result Compatible      IMAGING: ECHO 12/16/12 Left ventricle: The cavity size was normal. There was moderate concentric hypertrophy. Systolic function was normal. The estimated ejection fraction was in the range of 55% to 60%. Wall motion was normal; there were no regional wall motion abnormalities. Marked bradycardia (48 bpm) makes diastolic function evaluation less specific. - Mitral valve: Mild regurgitation.  - Left atrium: The atrium was moderately dilated.  TELEMETRY Most of the pauses are postextrasystolic (frequent PACs and PVCs). One instance, that clearly occurred while asleep is due to second degree AV block MT1, physiological. QRS is narrow. None of the pauses exceed 2 seconds.  Although the bradycardia occurs throughout the day, it may be associated with sleep (possibly also obstruction) as he is constantly nodding off, even when I was speaking to him.  ECG Sinus rhythm with PACs, one conducted with abberancy and 1st degree AV block. I do not see atrial fibrillation  IMPRESSION: 1. Asymptomatic bradycardia probably related to OSA and beta blocker therapy 2. Postextrasystolic pause, asymptomatic 3. Severe OSA, possibly with inadequate CPAP therapy - please refer to Dr. Evette Georges note 01/15/13 (CPAP 16 cm H2O recommended)  RECOMMENDATION: 1. He may develop beta blocker rebound if we stop his carvedilol too abruptly - will resume at a lower dose 2. Have called our office to try to call [facility] again to expedite the delivery of his CPAP  equipment  Time Spent Directly with Patient: 60 minutes  Sanda Klein, MD, [facility] [phone] office [phone] pager   03/09/2013, 9:53 AM

## 2013-03-09 NOTE — Progress Notes (Signed)
Urology Progress Note  Subjective:     No acute urologic events overnight. No clot obstruction. Had return of gross hematuria over the weekend which resolved w/ one day of continuous bladder irrigation. No CBI overnight. Urine is light tea colored.    ROS: Negative chest pain or SOB.  Objective:  Patient Vitals for the past 24 hrs:  BP Temp Temp src Pulse Resp SpO2 Weight  03/09/13 0531 - - - - - - 153.225 kg (337 lb 12.8 oz)  03/09/13 0456 150/76 mmHg 98.1 F (36.7 C) Oral 53 18 95 % -  03/08/13 2051 146/77 mmHg 98.7 F (37.1 C) Oral 55 18 95 % -  03/08/13 1418 128/53 mmHg 98.8 F (37.1 C) Oral 63 18 96 % -    Physical Exam: General:  No acute distress, awake Cardiovascular:    [x]   S1/S2 present, Mild bradycardia.  []   Irregularly irregular Chest:  CTA-B Abdomen:               []  Soft, appropriately TTP  [x]  Soft, NTTP  []  Soft, appropriately TTP, incision(s) clean/dry/intact  Genitourinary: Dorsal slit, negative foreskin erythema. Positive scrotal & foreskin edema. Foley:  Draining light tea urine. Off continuous bladder irrigation.    I/O last 3 completed shifts: In: 10856.2 [P.O.:2160; I.V.:1296.2; Other:7300; IV Piggyback:100] Out: 8100 [Urine:8100]  Recent Labs     03/08/13  0550  03/09/13  0459  HGB  7.3*  6.9*  WBC  6.1  5.5  PLT  267  282    Recent Labs     03/08/13  0550  03/09/13  0459  NA  138  140  K  5.0  5.1  CL  107  107  CO2  22  23  BUN  29*  29*  CREATININE  1.93*  1.78*  CALCIUM  8.0*  8.2*  GFRNONAA  33*  37*  GFRAA  39*  43*     Recent Labs     03/08/13  0550  03/09/13  0459  INR  1.98*  1.93*     No components found with this basename: ABG,     Length of stay: 7 days.  Assessment:  Gross hematuria. Anticoagulated.  Blood loss anemia.  Renal insufficiency.  Surgery 03/04/13: cystoscopy, clot evacuation, bladder/urethral biopsy, bladder fulguration.   Plan: -Hemoglobin down again; consider another  transfusion. -Discontinue foley catheter this morning. -Will reschedule outpatient hyperbaric oxygen appt which is scheduled for this coming Tuesday. -Renal function improving.  -Scrotal support for genital edema.    Rolan Bucco, MD [phone]

## 2013-03-09 NOTE — Progress Notes (Addendum)
[facility] PROGRESS NOTE  [name] VQQ:[id number] DOB: 04-May-1940 DOA: 03/02/2013 PCP: Melinda Crutch  Assessment/Plan  Gross hematuria, resolving - Management per urology  - voiding trial today  Acute on chronic kidney injury, baseline CKD stage 3 with creatinine of ~1.4, likely secondary to dehydration from lasix and poor oral intake last few days. No obvious nephrotoxins or contrast exposure in the last few days.  Resolving with hydration.   - RUS without obstruction - FENa prerenal -  Continue to hold ACEI for minimum of 1-2 weeks  Hypotension likely secondary to dehydration and resolved with IVF.  HTN -  Avoid norvasc due to LEE -  Change to hydralazine 25mg  TID -  Low dose carvedilol restarted  Bradycardia with 1st degree AV block and having pauses of > 2 seconds on telemetry despite stopping BB.  Appears to have bigeminy which may be causing lack of conduction of next beat.  HR increases appropriately (>20bpm) with exertion and patient asymptomatic.  -  Appreciate cardiology assistance  HLD, stable. 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  -  Coumadin dosed per pharmacy   Severe OSA on CPAP, needs CPAP settings adjusted -  Defer to outpatient  Perineal pain, may be related to recent bladder problems and procedures and resolving - RUS without obvious abnl to explain pain  - Continue prn oxycodone   Pseudomonas UTI, pansensitive -  D/c ceftriaxone -  Start ciprofloxacin    Constipation, resolved.  7 BMs recorded since yesterday -  Continue colace, senna -  Changed to prn miralax  Acute blood loss anemia, iron deficiency anemia, and B12 deficiency anemia, hemoglobin down to 6.9 today - Iron studies (and history) suggestive of iron deficiency, continue iron supplements - B12 deficiency:  110 - consider work up for pernicious anemia as outpatient > defer to PCP - will give  vitamin B12 injection and start daily supplementation - folate 1213 - SPEP/UPEP pending -  Defer TSH due to acute illness  -  Transfuse 2 unit PRBC  Diet: diabetic  Access: PIV  IVF: yes  Proph: coumadin   Code Status: full code  Family Communication: patient alone  Disposition Plan:  No PT/OT f/u. Awaiting improvement in kidney function.     Consultants:  Urology  Procedures:  Cystoscopy with clot evacuation and bladder and urethral biopsies 1/28  Antibiotics:  Ceftriaxone 1/31 >>   HPI/Subjective:  States he is feeling okay.  Denies SOB, nausea.  Constipation resolved.  Foley catheter removed  Objective: Filed Vitals:   03/09/13 0945 03/09/13 1030 03/09/13 1130 03/09/13 1212  BP: 175/68 165/78 177/70 167/77  Pulse: 52 50 54 52  Temp: 98.2 F (36.8 C) 98.6 F (37 C) 98.4 F (36.9 C) 98.7 F (37.1 C)  TempSrc: Oral Oral Oral Oral  Resp: 18 18 18 18   Height:      Weight:      SpO2: 95% 94% 100% 98%    Intake/Output Summary (Last 24 hours) at 03/09/13 1234 Last data filed at 03/09/13 1141  Gross per 24 hour  Intake   2090 ml  Output   1625 ml  Net    465 ml   Filed Weights   03/07/13 0500 03/08/13 0502 03/09/13 0531  Weight: 152.9 kg (337 lb 1.3 oz) 154.6 kg (340 lb 13.3 oz) 153.225 kg (337 lb 12.8 oz)    Exam:   General:  [name], No acute distress  HEENT:  NCAT, MMM  Cardiovascular:  RRR, nl S1, S2 no mrg, 2+ pulses, warm extremities  Respiratory:  CTAB, no increased WOB  Abdomen:   NABS, soft, NT/ND  MSK:   Normal tone and bulk, trace bilateral LEE  Neuro:  Grossly intact   Data Reviewed: Basic Metabolic Panel:  Recent Labs Lab 03/05/13 0430 03/06/13 0400 03/07/13 0555 03/08/13 0550 03/09/13 0459  NA 139 139 138 138 140  K 4.5 4.3 4.4 5.0 5.1  CL 105 104 105 107 107  CO2 23 23 22 22 23   GLUCOSE 127* 128* 116* 146* 140*  BUN 22 33* 35* 29* 29*  CREATININE 2.06* 3.44* 2.82* 1.93* 1.78*  CALCIUM 7.5* 7.8* 7.9* 8.0* 8.2*    Liver Function Tests: No results found for this basename: AST, ALT, ALKPHOS, BILITOT, PROT, ALBUMIN,  in the last 168 hours No results found for this basename: LIPASE, AMYLASE,  in the last 168 hours No results found for this basename: AMMONIA,  in the last 168 hours CBC:  Recent Labs Lab 03/04/13 0505 03/05/13 0430 03/06/13 0400 03/06/13 1320 03/07/13 0555 03/08/13 0550 03/09/13 0459  WBC 5.8 6.4 7.3  --   --  6.1 5.5  HGB 7.9* 7.5* 6.9* 8.1* 7.4* 7.3* 6.9*  HCT 25.9* 24.2* 22.4* 25.6* 23.7* 23.6* 22.0*  MCV 76.0* 77.1* 77.0*  --   --  77.4* 77.5*  PLT 290 242 266  --   --  267 282   Cardiac Enzymes: No results found for this basename: CKTOTAL, CKMB, CKMBINDEX, TROPONINI,  in the last 168 hours BNP (last 3 results) No results found for this basename: PROBNP,  in the last 8760 hours CBG:  Recent Labs Lab 03/06/13 0902 03/06/13 1123 03/06/13 1633 03/06/13 2230 03/07/13 0746  GLUCAP 124* 124* 122* 166* 112*    Recent Results (from the past 240 hour(s))  SURGICAL PCR SCREEN     Status: None   Collection Time    03/03/13  3:58 PM      Result Value Range Status   MRSA, PCR NEGATIVE  NEGATIVE Final   Staphylococcus aureus NEGATIVE  NEGATIVE Final   Comment:            The Xpert SA Assay (FDA     approved for NASAL specimens     in patients over 62 years of age),     is one component of     a comprehensive surveillance     program.  Test performance has     been validated by [employer] for patients greater     than or equal to 65 year old.     It is not intended     to diagnose infection nor to     guide or monitor treatment.  URINE CULTURE     Status: None   Collection Time    03/06/13  5:48 PM      Result Value Range Status   Specimen Description URINE, CATHETERIZED   Final   Special Requests NONE   Final   Culture  Setup Time     Final   Value: 03/07/2013 02:57     Performed at [place]     Final   Value: 85,000  COLONIES/ML     Performed at [facility]   Culture     Final   Value: [place]     Performed  at [facility]   Report Status PENDING   Incomplete     Studies: No results found.  Scheduled Meds: . carvedilol  3.125 mg Oral BID WC  . cefTRIAXone (ROCEPHIN)  IV  1 g Intravenous Q24H  . docusate sodium  100 mg Oral BID  . ferrous sulfate  325 mg Oral TID WC  . finasteride  5 mg Oral QPM  . insulin aspart  0-20 Units Subcutaneous TID WC  . insulin aspart  0-5 Units Subcutaneous QHS  . isosorbide-hydrALAZINE  2 tablet Oral BID  . niacin  1,000 mg Oral QHS  . polyethylene glycol  17 g Oral BID  . senna  2 tablet Oral QHS  . tamsulosin  0.4 mg Oral BID  . vitamin B-12  1,000 mcg Oral Daily  . Warfarin - Pharmacist Dosing Inpatient   Does not apply q1800   Continuous Infusions: . sodium chloride 10 mL/hr (03/08/13 0738)  . sodium chloride irrigation Stopped (03/08/13 1000)    Principal Problem:   Acute-on-chronic kidney injury, baseline CKD stage 3 Active Problems:   Chronic anticoagulation   History of pulmonary embolism   Essential hypertension   Gross hematuria   Preoperative clearance   Acute blood loss anemia   Hypotension, unspecified   Bradycardia   Perineal pain   1St degree AV block    Time spent: 30 min    Darrel Baroni, [facility] Pager [phone]. If 7PM-7AM, please contact night-coverage at [email or web address], password [facility] 03/09/2013, 12:34 PM  LOS: 7 days

## 2013-03-10 ENCOUNTER — Encounter (HOSPITAL_BASED_OUTPATIENT_CLINIC_OR_DEPARTMENT_OTHER): Payer: BC Managed Care – PPO

## 2013-03-10 DIAGNOSIS — R609 Edema, unspecified: Secondary | ICD-10-CM

## 2013-03-10 LAB — CBC
HEMATOCRIT: 26.1 % — AB (ref 39.0–52.0)
Hemoglobin: 8.3 g/dL — ABNORMAL LOW (ref 13.0–17.0)
MCH: 24.6 pg — AB (ref 26.0–34.0)
MCHC: 31.8 g/dL (ref 30.0–36.0)
MCV: 77.4 fL — AB (ref 78.0–100.0)
Platelets: 298 10*3/uL (ref 150–400)
RBC: 3.37 MIL/uL — ABNORMAL LOW (ref 4.22–5.81)
RDW: 17.5 % — ABNORMAL HIGH (ref 11.5–15.5)
WBC: 5.6 10*3/uL (ref 4.0–10.5)

## 2013-03-10 LAB — PROTEIN ELECTROPHORESIS, SERUM
ALBUMIN ELP: 51.4 % — AB (ref 55.8–66.1)
Alpha-1-Globulin: 10.6 % — ABNORMAL HIGH (ref 2.9–4.9)
Alpha-2-Globulin: 17.5 % — ABNORMAL HIGH (ref 7.1–11.8)
Beta 2: 4.5 % (ref 3.2–6.5)
Beta Globulin: 8.4 % — ABNORMAL HIGH (ref 4.7–7.2)
Gamma Globulin: 7.6 % — ABNORMAL LOW (ref 11.1–18.8)
M-SPIKE, %: NOT DETECTED g/dL
TOTAL PROTEIN ELP: 4 g/dL — AB (ref 6.0–8.3)

## 2013-03-10 LAB — BASIC METABOLIC PANEL
BUN: 20 mg/dL (ref 6–23)
CALCIUM: 8.5 mg/dL (ref 8.4–10.5)
CO2: 23 meq/L (ref 19–32)
CREATININE: 1.42 mg/dL — AB (ref 0.50–1.35)
Chloride: 107 mEq/L (ref 96–112)
GFR calc Af Amer: 56 mL/min — ABNORMAL LOW (ref >90)
GFR calc non Af Amer: 48 mL/min — ABNORMAL LOW (ref >90)
GLUCOSE: 120 mg/dL — AB (ref 70–99)
Potassium: 4.6 mEq/L (ref 3.7–5.3)
Sodium: 141 mEq/L (ref 137–147)

## 2013-03-10 LAB — TYPE AND SCREEN
ABO/RH(D): A POS
ANTIBODY SCREEN: NEGATIVE
UNIT DIVISION: 0
Unit division: 0

## 2013-03-10 LAB — PROTIME-INR
INR: 1.9 — ABNORMAL HIGH (ref 0.00–1.49)
Prothrombin Time: 21.2 seconds — ABNORMAL HIGH (ref 11.6–15.2)

## 2013-03-10 MED ORDER — HYDRALAZINE HCL 50 MG PO TABS
50.0000 mg | ORAL_TABLET | Freq: Three times a day (TID) | ORAL | Status: DC
  Filled 2013-03-10 (×3): qty 1

## 2013-03-10 MED ORDER — SODIUM CHLORIDE 0.9 % IR SOLN
Status: DC
  Filled 2013-03-10: qty 30

## 2013-03-10 MED ORDER — SODIUM CHLORIDE 0.9 % IR SOLN
3000.0000 mL | Status: DC
  Administered 2013-03-10: 1000 mL via INTRAVESICAL
  Administered 2013-03-11: 3000 mL via INTRAVESICAL

## 2013-03-10 MED ORDER — HYDRALAZINE HCL 50 MG PO TABS
50.0000 mg | ORAL_TABLET | Freq: Three times a day (TID) | ORAL | Status: DC
  Administered 2013-03-10 – 2013-03-11 (×4): 50 mg via ORAL
  Filled 2013-03-10 (×6): qty 1

## 2013-03-10 MED ORDER — SODIUM CHLORIDE 0.9 % IR SOLN
Status: DC
  Administered 2013-03-10 – 2013-03-11 (×2): via INTRAVESICAL
  Filled 2013-03-10 (×4): qty 40

## 2013-03-10 MED ORDER — HYDRALAZINE HCL 50 MG PO TABS
75.0000 mg | ORAL_TABLET | Freq: Three times a day (TID) | ORAL | Status: DC
  Filled 2013-03-10 (×3): qty 1

## 2013-03-10 MED ORDER — WARFARIN SODIUM 5 MG PO TABS
5.0000 mg | ORAL_TABLET | Freq: Once | ORAL | Status: AC
  Administered 2013-03-10: 5 mg via ORAL
  Filled 2013-03-10: qty 1

## 2013-03-10 NOTE — Progress Notes (Signed)
Hand irrigated pt's foley catheter x 1 with 30cc NS.  No blood clots returned, only light pink urine.  Pt's urine now appearing light pink/yellow in tubing.  Will continue to monitor pt.

## 2013-03-10 NOTE — Progress Notes (Addendum)
[facility] PROGRESS NOTE  IDEN STRIPLING HCW:[id number] DOB: 30-Sep-1940 DOA: 03/02/2013 PCP: Melinda Crutch  Brief Summary  The patient is a 73 y.o. 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 His course was complicated by diuresis with lasix which led to AKI and hypotension.  [facility] took over service at that time.  Currently managing BP and HR.  Hematuria persists.    Assessment/Plan  Gross hematuria, recurrent - Management per urology  - continuous irrigation restarted with aluminum  Acute on chronic kidney injury, baseline CKD stage 3 with creatinine of ~1.4, likely secondary to dehydration from lasix. No obvious nephrotoxins or contrast exposure.  Resolved with hydration.   - RUS without obstruction - FENa prerenal -  Continue to hold ACEI for minimum of 1-2 weeks  Hypotension likely secondary to dehydration and resolved with IVF.  HTN -  Avoid norvasc due to LEE -  Increase hydralazine to 50 mg TID -  Continue low dose carvedilol per cardiology to avoid rebound  Bradycardia with 1st degree AV block and having pauses of > 2 seconds on telemetry despite stopping BB.  Appears to have bigeminy which may be causing lack of conduction of next beat.  HR increases appropriately (>20bpm) with exertion and patient asymptomatic.  -  Appreciate cardiology assistance  HLD, stable. 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  -  Coumadin dosed per pharmacy   Severe OSA on CPAP, needs CPAP settings  adjusted -  Defer to outpatient  Perineal pain, may be related to recent bladder problems and procedures and resolving - RUS without obvious abnl to explain pain  - Continue prn oxycodone   Pseudomonas UTI, pansensitive -  Continue ciprofloxacin day 2 of 14   Constipation, resolved.  7 BMs recorded since yesterday -  Continue colace, senna -  Changed to prn miralax  Acute blood loss anemia, iron deficiency anemia, and B12 deficiency anemia, hemoglobin down to 6.9 today - Iron studies (and history) suggestive of iron deficiency, continue iron supplements - B12 deficiency:  110 - consider work up for pernicious anemia as outpatient > defer to PCP - will give vitamin B12 injection and start daily supplementation - folate 1213 - SPEP/UPEP pending -  Defer TSH due to acute illness  -  Transfused 2 unit PRBC 2/2  Diet: diabetic  Access: PIV  IVF: yes  Proph: coumadin   Code Status: full code  Family Communication: patient alone  Disposition Plan:  No PT/OT f/u. Awaiting resolution of hematuria.  Ongoing BP management   Consultants:  Urology  Procedures:  Cystoscopy with clot evacuation and bladder and urethral biopsies 1/28  Antibiotics:  Ceftriaxone 1/31 >>   HPI/Subjective:  States he got obstructed last night and had to have catheter replaced with irrigation of blood and clots.  Constipation resolved.    Objective: Filed Vitals:   03/10/13 0520 03/10/13 0603 03/10/13 0655 03/10/13 1412  BP: 167/85  166/86 174/89  Pulse: 51   51  Temp: 97.8 F (36.6 C)   98.2 F (36.8  C)  TempSrc: Oral   Oral  Resp: 18   20  Height:      Weight:  150 kg (330 lb 11 oz)    SpO2: 97%   98%    Intake/Output Summary (Last 24 hours) at 03/10/13 1840 Last data filed at 03/10/13 1700  Gross per 24 hour  Intake   1280 ml  Output   5250 ml  Net  -3970 ml   Filed Weights   03/08/13 0502 03/09/13 0531 03/10/13 0603  Weight: 154.6 kg (340 lb 13.3 oz) 153.225 kg (337 lb 12.8  oz) 150 kg (330 lb 11 oz)    Exam:   General:  [name], No acute distress  HEENT:  NCAT, MMM  Cardiovascular:  RRR, nl S1, S2 no mrg, 2+ pulses, warm extremities  Respiratory:  CTAB, no increased WOB  Abdomen:   NABS, soft, NT/ND  MSK:   Normal tone and bulk, trace bilateral LEE  Neuro:  Grossly intact   Data Reviewed: Basic Metabolic Panel:  Recent Labs Lab 03/06/13 0400 03/07/13 0555 03/08/13 0550 03/09/13 0459 03/10/13 0450  NA 139 138 138 140 141  K 4.3 4.4 5.0 5.1 4.6  CL 104 105 107 107 107  CO2 23 22 22 23 23   GLUCOSE 128* 116* 146* 140* 120*  BUN 33* 35* 29* 29* 20  CREATININE 3.44* 2.82* 1.93* 1.78* 1.42*  CALCIUM 7.8* 7.9* 8.0* 8.2* 8.5   Liver Function Tests: No results found for this basename: AST, ALT, ALKPHOS, BILITOT, PROT, ALBUMIN,  in the last 168 hours No results found for this basename: LIPASE, AMYLASE,  in the last 168 hours No results found for this basename: AMMONIA,  in the last 168 hours CBC:  Recent Labs Lab 03/05/13 0430 03/06/13 0400  03/07/13 0555 03/08/13 0550 03/09/13 0459 03/09/13 2153 03/10/13 0450  WBC 6.4 7.3  --   --  6.1 5.5  --  5.6  HGB 7.5* 6.9*  < > 7.4* 7.3* 6.9* 8.3* 8.3*  HCT 24.2* 22.4*  < > 23.7* 23.6* 22.0* 26.1* 26.1*  MCV 77.1* 77.0*  --   --  77.4* 77.5*  --  77.4*  PLT 242 266  --   --  267 282  --  298  < > = values in this interval not displayed. Cardiac Enzymes: No results found for this basename: CKTOTAL, CKMB, CKMBINDEX, TROPONINI,  in the last 168 hours BNP (last 3 results) No results found for this basename: PROBNP,  in the last 8760 hours CBG:  Recent Labs Lab 03/08/13 2112 03/09/13 0755 03/09/13 1155 03/09/13 1819 03/09/13 2140  GLUCAP 130* 134* 133* 161* 130*    Recent Results (from the past 240 hour(s))  SURGICAL PCR SCREEN     Status: None   Collection Time    03/03/13  3:58 PM      Result Value Range Status   MRSA, PCR NEGATIVE  NEGATIVE Final   Staphylococcus aureus  NEGATIVE  NEGATIVE Final   Comment:            The Xpert SA Assay (FDA     approved for NASAL specimens     in patients over 64 years of age),     is one component of     a comprehensive surveillance     program.  Test performance has     been validated by [employer] for patients greater     than or equal to 1 year  old.     It is not intended     to diagnose infection nor to     guide or monitor treatment.  URINE CULTURE     Status: None   Collection Time    03/06/13  5:48 PM      Result Value Range Status   Specimen Description URINE, CATHETERIZED   Final   Special Requests NONE   Final   Culture  Setup Time     Final   Value: 03/07/2013 02:57     Performed at [place]     Final   Value: 85,000 COLONIES/ML     Performed at [facility]   Culture     Final   Value: PSEUDOMONAS AERUGINOSA     Performed at [facility]   Report Status 03/09/2013 FINAL   Final   Organism ID, Bacteria PSEUDOMONAS AERUGINOSA   Final     Studies: US Pelvis Limited  03/09/2013   CLINICAL DATA:  Evaluate urinary bladder volume, patient has history of recent bladder wall biopsy  EXAM: US PELVIS LIMITED  TECHNIQUE: Ultrasound examination of the pelvic soft tissues was performed in the area of clinical concern.  COMPARISON:  US RENAL dated 03/06/2013  FINDINGS: The urinary bladder volume is calculated at 614 cc. No definite abnormality of the bladder wall is demonstrated.  IMPRESSION: The urinary bladder volume is approximately 614 cc.   Electronically Signed   By: David  Martinique   On: 03/09/2013 18:34    Scheduled Meds: . carboprost (HEMABATE) bladder irrigation - continuous   Bladder Irrigation Q24H   And  . sodium chloride irrigation  3,000 mL Bladder Irrigation Q24H  . carvedilol  3.125 mg Oral BID WC  . ciprofloxacin  400 mg Intravenous Q12H  . docusate sodium  100 mg Oral BID  . ferrous sulfate  325 mg Oral TID WC  . finasteride  5 mg Oral QPM  .  hydrALAZINE  50 mg Oral TID  . insulin aspart  0-20 Units Subcutaneous TID WC  . insulin aspart  0-5 Units Subcutaneous QHS  . niacin  1,000 mg Oral QHS  . senna  2 tablet Oral QHS  . tamsulosin  0.4 mg Oral BID  . vitamin B-12  1,000 mcg Oral Daily  . Warfarin - Pharmacist Dosing Inpatient   Does not apply q1800   Continuous Infusions: . sodium chloride 10 mL/hr (03/08/13 0738)  . sodium chloride irrigation Stopped (03/08/13 1000)    Principal Problem:   Acute-on-chronic kidney injury, baseline CKD stage 3 Active Problems:   Chronic anticoagulation   History of pulmonary embolism   Essential hypertension   Gross hematuria   Preoperative clearance   Acute blood loss anemia   Hypotension, unspecified   Bradycardia   Perineal pain   1St degree AV block    Time spent: 30 min    Rikia Sukhu, [facility] Pager [phone]. If 7PM-7AM, please contact night-coverage at [email or web address], password [facility] 03/10/2013, 6:40 PM  LOS: 8 days

## 2013-03-10 NOTE — Progress Notes (Signed)
ANTICOAGULATION CONSULT NOTE - Follow Up  Pharmacy Consult for Warfarin Indication: Hx of PE/DVT  Patient Measurements: Height: 5\' 10"  (177.8 cm) Weight: 330 lb 11 oz (150 kg) IBW/kg (Calculated) : 73  Labs:  Recent Labs  03/08/13 0550 03/09/13 0459 03/09/13 2153 03/10/13 0450  HGB 7.3* 6.9* 8.3* 8.3*  HCT 23.6* 22.0* 26.1* 26.1*  PLT 267 282  --  298  LABPROT 21.9* 21.5*  --  21.2*  INR 1.98* 1.93*  --  1.90*  CREATININE 1.93* 1.78*  --  1.42*   Medications:  Scheduled:  . carboprost (HEMABATE) bladder irrigation - continuous   Bladder Irrigation Q24H   And  . sodium chloride irrigation  3,000 mL Bladder Irrigation Q24H  . carvedilol  3.125 mg Oral BID WC  . ciprofloxacin  400 mg Intravenous Q12H  . docusate sodium  100 mg Oral BID  . ferrous sulfate  325 mg Oral TID WC  . finasteride  5 mg Oral QPM  . hydrALAZINE  50 mg Oral TID  . insulin aspart  0-20 Units Subcutaneous TID WC  . insulin aspart  0-5 Units Subcutaneous QHS  . niacin  1,000 mg Oral QHS  . senna  2 tablet Oral QHS  . tamsulosin  0.4 mg Oral BID  . vitamin B-12  1,000 mcg Oral Daily  . Warfarin - Pharmacist Dosing Inpatient   Does not apply q1800   Assessment: 55 yoM with prostate Ca, hematuria. Cysto, bladder biopsy, bladder fulguration 1/28. On chronic warfarin for hx of PE/DVT, home dose 7.5mg  daily except 5mg  Mon,Thur.   Warfarin 5mg  daily ordered by urology from 1/28 with INR 2.85, one dose given 1/28  INR 1/29 and 1/30 > 3, contacted urology-planned to hold Warfarin x 2 days  Hospitalist service taking over care from urology 1/31-ordered Warfarin per Pharmacy  Warfarin 5mg  ordered 1/31, held for hematuria, Warfarin resumed 2/1 with INR 1.98  Hgb 8.3, improved after 2 units of PRBC this AM following Hgb result.  Low Hgb 2/2 ABLA, iron deficiency and B12 deficiency.  Iron and B12 supplementation ordered.  INR remains slightly subtherapeutic today at 1.90.  Urology starting Hemabate bladder  irrigation today for continued hematuria (episode early this AM).  Continuing to anticoagulate with warfarin.  Urine improved later this AM per progress notes.  Cipro started 2/2, interacts with warfarin causing increase in INR.    Goal of Therapy:  INR 2-3   Plan:   Warfarin 5mg  today @ 1800.  Being cautious with warfarin dosing due to hematuria and new drug interaction with Cipro.  Daily INR.  Hershal Coria, PharmD, BCPS Pager: [phone] 03/10/2013 11:51 AM

## 2013-03-10 NOTE — Progress Notes (Signed)
Patient Name: [name] Date of Encounter: 03/10/2013  Principal Problem:   Acute-on-chronic kidney injury, baseline CKD stage 3 Active Problems:   Chronic anticoagulation   History of pulmonary embolism   Essential hypertension   Gross hematuria   Preoperative clearance   Acute blood loss anemia   Hypotension, unspecified   Bradycardia   Perineal pain   1St degree AV block   Length of Stay: 8  SUBJECTIVE  Sleeping fully supine, breathing comfortably No syncope No longer on telemetry His CPAP has just been re-approved by his secondary insurance and a permanent unit should be delivered soon.   CURRENT MEDS . carvedilol  3.125 mg Oral BID WC  . ciprofloxacin  400 mg Intravenous Q12H  . docusate sodium  100 mg Oral BID  . ferrous sulfate  325 mg Oral TID WC  . finasteride  5 mg Oral QPM  . hydrALAZINE  50 mg Oral TID  . insulin aspart  0-20 Units Subcutaneous TID WC  . insulin aspart  0-5 Units Subcutaneous QHS  . niacin  1,000 mg Oral QHS  . senna  2 tablet Oral QHS  . tamsulosin  0.4 mg Oral BID  . vitamin B-12  1,000 mcg Oral Daily  . Warfarin - Pharmacist Dosing Inpatient   Does not apply q1800    OBJECTIVE   Intake/Output Summary (Last 24 hours) at 03/10/13 0815 Last data filed at 03/10/13 0655  Gross per 24 hour  Intake 1012.5 ml  Output   2650 ml  Net -1637.5 ml   Filed Weights   03/08/13 0502 03/09/13 0531 03/10/13 0603  Weight: 154.6 kg (340 lb 13.3 oz) 153.225 kg (337 lb 12.8 oz) 150 kg (330 lb 11 oz)    PHYSICAL EXAM Filed Vitals:   03/10/13 0212 03/10/13 0520 03/10/13 0603 03/10/13 0655  BP: 153/69 167/85  166/86  Pulse:  51    Temp:  97.8 F (36.6 C)    TempSrc:  Oral    Resp:  18    Height:      Weight:   150 kg (330 lb 11 oz)   SpO2:  97%     General appearance: morbidly obese and falls asleep repeatedly during interview  Neck: no adenopathy, no carotid bruit, supple, symmetrical, trachea midline, thyroid not enlarged,  symmetric, no tenderness/mass/nodules and cannot see the JVP. Crowded oropharynx, Mallampati 3  Lungs: clear to auscultation bilaterally  Heart: regular rate and rhythm with occasional ectopy, S1, S2 normal, no click, rub or gallop, 1/6 systolic ejection murmur  Abdomen: limited exam due to obesity, no obvious abnormality  Extremities: leg edema with chronic changes, 3+ symmetrical to knees; bilateral large varicose veins  Pulses: 2+ and symmetric  Neurologic: Once he is awakened he is alert and oriented X 3, normal strength and tone. Normal symmetric reflexes. Normal coordination and gait.   LABS  CBC  Recent Labs  03/09/13 0459 03/09/13 2153 03/10/13 0450  WBC 5.5  --  5.6  HGB 6.9* 8.3* 8.3*  HCT 22.0* 26.1* 26.1*  MCV 77.5*  --  77.4*  PLT 282  --  778   Basic Metabolic Panel  Recent Labs  03/09/13 0459 03/10/13 0450  NA 140 141  K 5.1 4.6  CL 107 107  CO2 23 23  GLUCOSE 140* 120*  BUN 29* 20  CREATININE 1.78* 1.42*  CALCIUM 8.2* 8.5    Radiology Studies US Pelvis Limited  03/09/2013   CLINICAL DATA:  Evaluate urinary bladder volume,  patient has history of recent bladder wall biopsy  EXAM: US PELVIS LIMITED  TECHNIQUE: Ultrasound examination of the pelvic soft tissues was performed in the area of clinical concern.  COMPARISON:  US RENAL dated 03/06/2013  FINDINGS: The urinary bladder volume is calculated at 614 cc. No definite abnormality of the bladder wall is demonstrated.  IMPRESSION: The urinary bladder volume is approximately 614 cc.   Electronically Signed   By: David  Martinique   On: 03/09/2013 18:34     ASSESSMENT AND PLAN  BP higher, but still relatively bradycardic Increase hydralazine INR slightly subtherapeutic  F/U with Dr. Claiborne Billings after discharge Please reconsult if needed   Sanda Klein, MD, [facility] [phone] office [phone] pager 03/10/2013 8:15 AM

## 2013-03-10 NOTE — Progress Notes (Signed)
Pt's urine started out light green/blue in the foley bag at the beginning of this RN's shift.  Pt's urine has progressively gotten darker red throughout the night.  When I went in to empty pt's foley bag around 0230am, he had 1100 cc dark red urine with few blood clots.  Pt sleeping at this time, no complaints of obstruction or urinary discomfort.  Notified MD on call for Dr. Jasmine December.  Received call back from [facility], [place] to hand irrigate foley catheter now and then PRN if there is a decrease in urine production or urine becomes more bloody/red.  Will continue to monitor pt.

## 2013-03-10 NOTE — Progress Notes (Signed)
Carboprost continuous irrigation started at 1140 per MD orders. Catheter hand irrigated with 50cc of NS x3 with no clots returned and urine light pink. Continued with starting Carboprost per MD orders. Carboprost connected to foley catheter using a male adapter and the rate is being controlled with an IV pump at 126ml/hr. Carboprost to infuse for 10 hours and then to be followed with NS irrigation at 126ml/hr for 14 hours, then the Carboprost will be infused again. This cycle to continue for 4 days per MD orders. Pt educated to notify RN if he feels any pressure or any other indication that the catheter might be clogged and not draining. Will monitor infusion rate and urine output closely.   Othella Boyer [facility] 03/10/2013

## 2013-03-10 NOTE — Progress Notes (Addendum)
Urology Progress Note  Subjective:     No acute urologic events overnight. No clot obstruction. Had return of gross hematuria over the weekend which resolved w/ one day of continuous bladder irrigation. No CBI overnight. Urine is mostly clear with occasional pink colored urine passing through off CBI.    ROS: Negative chest pain or SOB.  Objective:  Patient Vitals for the past 24 hrs:  BP Temp Temp src Pulse Resp SpO2 Weight  03/10/13 0655 166/86 mmHg - - - - - -  03/10/13 0603 - - - - - - 150 kg (330 lb 11 oz)  03/10/13 0520 167/85 mmHg 97.8 F (36.6 C) Oral 51 18 97 % -  03/10/13 0212 153/69 mmHg - - - - - -  03/09/13 2344 170/90 mmHg - - - - - -  03/09/13 2103 170/77 mmHg 99.2 F (37.3 C) Oral 60 20 98 % -  03/09/13 1530 181/90 mmHg 99.2 F (37.3 C) Oral 58 18 96 % -  03/09/13 1445 185/83 mmHg 98.2 F (36.8 C) Oral 60 18 98 % -  03/09/13 1345 166/76 mmHg 98.7 F (37.1 C) Oral 52 18 98 % -  03/09/13 1245 179/77 mmHg 98.3 F (36.8 C) Oral 57 20 97 % -  03/09/13 1215 158/78 mmHg 98.4 F (36.9 C) Oral 54 18 98 % -  03/09/13 1212 167/77 mmHg 98.7 F (37.1 C) Oral 52 18 98 % -  03/09/13 1130 177/70 mmHg 98.4 F (36.9 C) Oral 54 18 100 % -  03/09/13 1030 165/78 mmHg 98.6 F (37 C) Oral 50 18 94 % -  03/09/13 0945 175/68 mmHg 98.2 F (36.8 C) Oral 52 18 95 % -  03/09/13 0900 165/74 mmHg 97.9 F (36.6 C) Oral 58 18 96 % -    Physical Exam: General:  No acute distress, awake Cardiovascular:    [x]   S1/S2 present, Mild bradycardia.  []   Irregularly irregular Chest:  CTA-B Abdomen:               []  Soft, appropriately TTP  [x]  Soft, NTTP  []  Soft, appropriately TTP, incision(s) clean/dry/intact  Genitourinary: Dorsal slit, negative foreskin erythema. Positive scrotal & foreskin edema. Foley:  Draining clear to light pink urine. Off continuous bladder irrigation.    I/O last 3 completed shifts: In: 2622.5 [P.O.:2160; I.V.:400; Blood:12.5; IV Piggyback:50] Out: 3875  [Urine:4050]  Recent Labs     03/09/13  0459  03/09/13  2153  03/10/13  0450  HGB  6.9*  8.3*  8.3*  WBC  5.5   --   5.6  PLT  282   --   298    Recent Labs     03/09/13  0459  03/10/13  0450  NA  140  141  K  5.1  4.6  CL  107  107  CO2  23  23  BUN  29*  20  CREATININE  1.78*  1.42*  CALCIUM  8.2*  8.5  GFRNONAA  37*  48*  GFRAA  43*  56*     Recent Labs     03/09/13  0459  03/10/13  0450  INR  1.93*  1.90*     No components found with this basename: ABG,     Length of stay: 8 days.  Assessment:  Gross hematuria. Anticoagulated.  Blood loss anemia.  Renal insufficiency.  Surgery 03/04/13: cystoscopy, clot evacuation, bladder/urethral biopsy, bladder fulguration.   Plan: -Hemoglobin stable after transfusion yesterday. -Failed voiding  trial yesterday. I replaced 22Fr 3-way foley. -Will start Alum bladder irrigation for hematuria. Have discussed w/ patient along w/ risk/benefits. -Renal function improving.  -Scrotal support for genital edema. -If hematuria remains stable and other medical morbidities remain stable, he may be ok for discharge home tomorrow.    Rolan Bucco, MD [phone]  ADDENDUM: Alum irrigation is not available system-wide. The alternative is carboprost, which does not increase risk of DVT. We will use this for bladder irrigation instead.

## 2013-03-11 LAB — BASIC METABOLIC PANEL
BUN: 19 mg/dL (ref 6–23)
CO2: 24 mEq/L (ref 19–32)
CREATININE: 1.35 mg/dL (ref 0.50–1.35)
Calcium: 8.5 mg/dL (ref 8.4–10.5)
Chloride: 104 mEq/L (ref 96–112)
GFR calc Af Amer: 59 mL/min — ABNORMAL LOW (ref >90)
GFR, EST NON AFRICAN AMERICAN: 51 mL/min — AB (ref >90)
GLUCOSE: 140 mg/dL — AB (ref 70–99)
Potassium: 4.1 mEq/L (ref 3.7–5.3)
Sodium: 138 mEq/L (ref 137–147)

## 2013-03-11 LAB — GLUCOSE, CAPILLARY
GLUCOSE-CAPILLARY: 147 mg/dL — AB (ref 70–99)
GLUCOSE-CAPILLARY: 127 mg/dL — AB (ref 70–99)
Glucose-Capillary: 115 mg/dL — ABNORMAL HIGH (ref 70–99)
Glucose-Capillary: 162 mg/dL — ABNORMAL HIGH (ref 70–99)
Glucose-Capillary: 140 mg/dL — ABNORMAL HIGH (ref 70–99)
Glucose-Capillary: 163 mg/dL — ABNORMAL HIGH (ref 70–99)
Glucose-Capillary: 150 mg/dL — ABNORMAL HIGH (ref 70–99)
Glucose-Capillary: 116 mg/dL — ABNORMAL HIGH (ref 70–99)

## 2013-03-11 LAB — PROTIME-INR
INR: 1.84 — ABNORMAL HIGH (ref 0.00–1.49)
Prothrombin Time: 20.7 seconds — ABNORMAL HIGH (ref 11.6–15.2)

## 2013-03-11 LAB — CBC
HEMATOCRIT: 27.1 % — AB (ref 39.0–52.0)
Hemoglobin: 8.5 g/dL — ABNORMAL LOW (ref 13.0–17.0)
MCH: 24.6 pg — AB (ref 26.0–34.0)
MCHC: 31.4 g/dL (ref 30.0–36.0)
MCV: 78.3 fL (ref 78.0–100.0)
PLATELETS: 292 10*3/uL (ref 150–400)
RBC: 3.46 MIL/uL — AB (ref 4.22–5.81)
RDW: 17.6 % — AB (ref 11.5–15.5)
WBC: 5 10*3/uL (ref 4.0–10.5)

## 2013-03-11 MED ORDER — WARFARIN SODIUM 7.5 MG PO TABS
7.5000 mg | ORAL_TABLET | Freq: Once | ORAL | Status: AC
  Administered 2013-03-11: 7.5 mg via ORAL
  Filled 2013-03-11: qty 1

## 2013-03-11 MED ORDER — KETOROLAC TROMETHAMINE 15 MG/ML IJ SOLN
15.0000 mg | Freq: Four times a day (QID) | INTRAMUSCULAR | Status: DC | PRN
  Administered 2013-03-11: 15 mg via INTRAVENOUS
  Filled 2013-03-11: qty 1

## 2013-03-11 MED ORDER — HYDRALAZINE HCL 50 MG PO TABS
75.0000 mg | ORAL_TABLET | Freq: Three times a day (TID) | ORAL | Status: DC
  Administered 2013-03-11 – 2013-03-12 (×3): 75 mg via ORAL
  Filled 2013-03-11 (×5): qty 1

## 2013-03-11 MED ORDER — ACETAMINOPHEN 325 MG PO TABS
650.0000 mg | ORAL_TABLET | ORAL | Status: DC | PRN
  Administered 2013-03-11: 650 mg via ORAL
  Filled 2013-03-11: qty 2

## 2013-03-11 NOTE — Progress Notes (Signed)
[facility]  [facility] is providing the following services: rw  If patient discharges after hours, please call [phone].   Linward Headland 03/11/2013, 10:37 AM

## 2013-03-11 NOTE — Progress Notes (Signed)
ANTICOAGULATION CONSULT NOTE - Follow Up  Pharmacy Consult for Warfarin Indication: Hx of PE/DVT  Patient Measurements: Height: 5\' 10"  (177.8 cm) Weight: 326 lb 11.6 oz (148.2 kg) IBW/kg (Calculated) : 73  Labs:  Recent Labs  03/09/13 0459 03/09/13 2153 03/10/13 0450 03/11/13 0455  HGB 6.9* 8.3* 8.3* 8.5*  HCT 22.0* 26.1* 26.1* 27.1*  PLT 282  --  298 292  LABPROT 21.5*  --  21.2* 20.7*  INR 1.93*  --  1.90* 1.84*  CREATININE 1.78*  --  1.42* 1.35   Medications:  Scheduled:  . carboprost (HEMABATE) bladder irrigation - continuous   Bladder Irrigation Q24H   And  . sodium chloride irrigation  3,000 mL Bladder Irrigation Q24H  . carvedilol  3.125 mg Oral BID WC  . ciprofloxacin  400 mg Intravenous Q12H  . docusate sodium  100 mg Oral BID  . ferrous sulfate  325 mg Oral TID WC  . finasteride  5 mg Oral QPM  . hydrALAZINE  75 mg Oral TID  . insulin aspart  0-20 Units Subcutaneous TID WC  . insulin aspart  0-5 Units Subcutaneous QHS  . niacin  1,000 mg Oral QHS  . senna  2 tablet Oral QHS  . tamsulosin  0.4 mg Oral BID  . vitamin B-12  1,000 mcg Oral Daily  . Warfarin - Pharmacist Dosing Inpatient   Does not apply q1800   Assessment: 78 yoM with prostate Ca, hematuria. Cysto, bladder biopsy, bladder fulguration 1/28. On chronic warfarin for hx of PE/DVT, home dose 7.5mg  daily except 5mg  Mon,Thur.   Warfarin 5mg  daily ordered by urology from 1/28 with INR 2.85, one dose given 1/28  INR 1/29 and 1/30 > 3, contacted urology-planned to hold Warfarin x 2 days  Hospitalist service taking over care from urology 1/31-ordered Warfarin per Pharmacy  Warfarin 5mg  ordered 1/31, held for hematuria, Warfarin resumed 2/1 with INR 1.98  Hgb 8.3, improved after 2 units of PRBC this AM following Hgb result.  Low Hgb 2/2 ABLA, iron deficiency and B12 deficiency.  Iron and B12 supplementation ordered.  INR = 1.84.  Have been cautious with doses due to hematuria, recent  supratherapeutic INR, and new drug interaction with Cipro.  Since INR slowly trending down, will try home dose of 7.5 mg today.  Cipro started 2/2, interacts with warfarin causing increase in INR.    Goal of Therapy:  INR 2-3   Plan:   Warfarin 7.5mg  today @ 1800.    Daily INR.  Patient will require close INR follow up at discharge.  Hershal Coria, PharmD, BCPS Pager: [phone] 03/11/2013 12:37 PM

## 2013-03-11 NOTE — Progress Notes (Signed)
Home CPAP set up at bedside.  Patient states he is able to place himself on/off as needed.  Patient encouraged to call Respiratory if assistance needed.

## 2013-03-11 NOTE — Progress Notes (Signed)
Urology Progress Note  Subjective:     No acute urologic events overnight. No clots or hematuria overnight since starting intravesical carboprost. Has HA and HTN.    ROS: Negative chest pain or SOB.  Objective:  Patient Vitals for the past 24 hrs:  BP Temp Temp src Pulse Resp SpO2 Weight  03/11/13 1124 149/77 mmHg - - 58 - - -  03/11/13 0707 165/69 mmHg - - - - - -  03/11/13 0635 183/90 mmHg 97.5 F (36.4 C) Oral 53 20 95 % 148.2 kg (326 lb 11.6 oz)  03/11/13 0040 176/76 mmHg - - 57 - - -  03/10/13 2352 189/84 mmHg - - 61 - - -  03/10/13 2214 183/88 mmHg 98.3 F (36.8 C) Oral 59 20 97 % -  03/10/13 1907 168/88 mmHg - - 58 - - -  03/10/13 1412 174/89 mmHg 98.2 F (36.8 C) Oral 51 20 98 % -    Physical Exam: General:  No acute distress, awake Cardiovascular:    [x]   S1/S2 present, Mild bradycardia.  []   Irregularly irregular Chest:  CTA-B Abdomen:               []  Soft, appropriately TTP  [x]  Soft, NTTP  []  Soft, appropriately TTP, incision(s) clean/dry/intact  Genitourinary: Dorsal slit, negative foreskin erythema. Positive scrotal & foreskin edema. Foley:  Draining clear to light pink urine. Off continuous bladder irrigation.    I/O last 3 completed shifts: In: 2740 [P.O.:960; I.V.:580; Other:800; IV Piggyback:400] Out: 9075 [Urine:9075]  Recent Labs     03/10/13  0450  03/11/13  0455  HGB  8.3*  8.5*  WBC  5.6  5.0  PLT  298  292    Recent Labs     03/10/13  0450  03/11/13  0455  NA  141  138  K  4.6  4.1  CL  107  104  CO2  23  24  BUN  20  19  CREATININE  1.42*  1.35  CALCIUM  8.5  8.5  GFRNONAA  48*  51*  GFRAA  56*  59*     Recent Labs     03/10/13  0450  03/11/13  0455  INR  1.90*  1.84*     No components found with this basename: ABG,     Length of stay: 9 days.  Assessment:  Gross hematuria. Anticoagulated.  Blood loss anemia.  Renal insufficiency.  Surgery 03/04/13: cystoscopy, clot evacuation, bladder/urethral biopsy,  bladder fulguration.   Plan: -Hemoglobin stable. -Since carboprost is working so well, I would recommend another night of the medication and reassess in the morning. -Carboprost may be causing his HA and HTN; should improve with medication is stopped.   Rolan Bucco, MD [phone]

## 2013-03-11 NOTE — Progress Notes (Signed)
Pt has home cpap at bedside and declined assistance with it.  Pt states he can place it on himself when ready.

## 2013-03-11 NOTE — Progress Notes (Signed)
[facility] PROGRESS NOTE  [name] [name] DOB: 06-Nov-1940 DOA: 03/02/2013 PCP: Melinda Crutch   Assessment/Plan  Gross hematuria, recurrent - Management per urology  - continuous irrigation restarted with aluminum - No significant hematuria this AM  Acute on chronic kidney injury, baseline CKD stage 3 with creatinine of ~1.4, likely secondary to dehydration from lasix. No obvious nephrotoxins or contrast exposure.  Resolved with hydration.   - RUS without obstruction - FENa prerenal -  Continue to hold ACEI for minimum of 1-2 weeks - Renal fx now at baseline  Hypotension likely secondary to dehydration and resolved with IVF.  HTN -  Avoid norvasc due to LEE -  Increased hydralazine to 75 mg TID -  Continue low dose carvedilol per cardiology to avoid rebound  Bradycardia with 1st degree AV block and having pauses of > 2 seconds on telemetry despite stopping BB.  Appears to have bigeminy which may be causing lack of conduction of next beat.  HR increases appropriately (>20bpm) with exertion and patient asymptomatic.  -  Appreciate cardiology assistance - Hydralazine increased  HLD, stable. 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  7 BMs recorded since yesterday -  Continue colace, senna -  Changed to prn miralax  Acute blood loss anemia, iron deficiency anemia, and B12 deficiency anemia, hemoglobin down to 6.9 today - Iron studies (and history) suggestive of iron deficiency, continue iron supplements - B12 deficiency:   110 - consider work up for pernicious anemia as outpatient > defer to PCP - will give vitamin B12 injection and start daily supplementation - folate 1213 - SPEP/UPEP pending -  Defer TSH due to acute illness  -  Transfused 2 unit PRBC 2/2  Headache - No focal neurologic findings - Little relief with tylenol - Consider toradol  Diet: diabetic  Access: PIV  IVF: yes  Proph: coumadin   Code Status: full code  Family Communication: patient alone  Disposition Plan:  No PT/OT f/u. Awaiting resolution of hematuria.  Ongoing BP management   Consultants:  Urology  Cardiology  Procedures:  Cystoscopy with clot evacuation and bladder and urethral biopsies 1/28  Antibiotics:  Ceftriaxone 1/31 >>   HPI/Subjective:  Complains of B band-like headache present since awakening this AM. No photophobia. No aura.  Objective: Filed Vitals:   03/11/13 0040 03/11/13 0635 03/11/13 0707 03/11/13 1124  BP: 176/76 183/90 165/69 149/77  Pulse: 57 53  58  Temp:  97.5 F (36.4 C)    TempSrc:  Oral    Resp:  20    Height:      Weight:  148.2 kg (326 lb 11.6 oz)    SpO2:  95%      Intake/Output Summary (Last 24 hours) at 03/11/13 1208 Last data filed at 03/11/13 0644  Gross per 24 hour  Intake   2100 ml  Output   5550 ml  Net  -3450 ml   Filed Weights   03/09/13 0531 03/10/13 0603 03/11/13 0635  Weight: 153.225 kg (337  lb 12.8 oz) 150 kg (330 lb 11 oz) 148.2 kg (326 lb 11.6 oz)    Exam:   General:  [name], No acute distress  HEENT:  NCAT, MMM  Cardiovascular:  RRR, nl S1, S2 no mrg, 2+ pulses, warm extremities  Respiratory:  CTAB, no increased WOB  Abdomen:   NABS, soft, NT/ND  MSK:   Normal tone and bulk, trace bilateral LEE  Neuro:  Grossly intact   Data Reviewed: Basic Metabolic Panel:  Recent Labs Lab 03/07/13 0555 03/08/13 0550 03/09/13 0459 03/10/13 0450 03/11/13 0455  NA 138 138 140 141 138  K 4.4 5.0 5.1 4.6 4.1  CL 105 107 107 107 104  CO2  22 22 23 23 24   GLUCOSE 116* 146* 140* 120* 140*  BUN 35* 29* 29* 20 19  CREATININE 2.82* 1.93* 1.78* 1.42* 1.35  CALCIUM 7.9* 8.0* 8.2* 8.5 8.5   Liver Function Tests: No results found for this basename: AST, ALT, ALKPHOS, BILITOT, PROT, ALBUMIN,  in the last 168 hours No results found for this basename: LIPASE, AMYLASE,  in the last 168 hours No results found for this basename: AMMONIA,  in the last 168 hours CBC:  Recent Labs Lab 03/06/13 0400  03/08/13 0550 03/09/13 0459 03/09/13 2153 03/10/13 0450 03/11/13 0455  WBC 7.3  --  6.1 5.5  --  5.6 5.0  HGB 6.9*  < > 7.3* 6.9* 8.3* 8.3* 8.5*  HCT 22.4*  < > 23.6* 22.0* 26.1* 26.1* 27.1*  MCV 77.0*  --  77.4* 77.5*  --  77.4* 78.3  PLT 266  --  267 282  --  298 292  < > = values in this interval not displayed. Cardiac Enzymes: No results found for this basename: CKTOTAL, CKMB, CKMBINDEX, TROPONINI,  in the last 168 hours BNP (last 3 results) No results found for this basename: PROBNP,  in the last 8760 hours CBG:  Recent Labs Lab 03/08/13 2112 03/09/13 0755 03/09/13 1155 03/09/13 1819 03/09/13 2140  GLUCAP 130* 134* 133* 161* 130*    Recent Results (from the past 240 hour(s))  SURGICAL PCR SCREEN     Status: None   Collection Time    03/03/13  3:58 PM      Result Value Range Status   MRSA, PCR NEGATIVE  NEGATIVE Final   Staphylococcus aureus NEGATIVE  NEGATIVE Final   Comment:            The Xpert SA Assay (FDA     approved for NASAL specimens     in patients over 61 years of age),     is one component of     a comprehensive surveillance     program.  Test performance has     been validated by [employer] for patients greater     than or equal to 32 year old.     It is not intended     to diagnose infection nor to     guide or monitor treatment.  URINE CULTURE     Status: None   Collection Time    03/06/13  5:48 PM      Result Value Range Status   Specimen Description URINE, CATHETERIZED   Final    Special Requests NONE   Final   Culture  Setup Time     Final   Value: 03/07/2013 02:57     Performed at [facility]  Final   Value: 85,000 COLONIES/ML     Performed at [facility]   Culture     Final   Value: PSEUDOMONAS AERUGINOSA     Performed at [facility]   Report Status 03/09/2013 FINAL   Final   Organism ID, Bacteria PSEUDOMONAS AERUGINOSA   Final     Studies: US Pelvis Limited  03/09/2013   CLINICAL DATA:  Evaluate urinary bladder volume, patient has history of recent bladder wall biopsy  EXAM: US PELVIS LIMITED  TECHNIQUE: Ultrasound examination of the pelvic soft tissues was performed in the area of clinical concern.  COMPARISON:  US RENAL dated 03/06/2013  FINDINGS: The urinary bladder volume is calculated at 614 cc. No definite abnormality of the bladder wall is demonstrated.  IMPRESSION: The urinary bladder volume is approximately 614 cc.   Electronically Signed   By: David  Martinique   On: 03/09/2013 18:34    Scheduled Meds: . carboprost (HEMABATE) bladder irrigation - continuous   Bladder Irrigation Q24H   And  . sodium chloride irrigation  3,000 mL Bladder Irrigation Q24H  . carvedilol  3.125 mg Oral BID WC  . ciprofloxacin  400 mg Intravenous Q12H  . docusate sodium  100 mg Oral BID  . ferrous sulfate  325 mg Oral TID WC  . finasteride  5 mg Oral QPM  . hydrALAZINE  50 mg Oral TID  . insulin aspart  0-20 Units Subcutaneous TID WC  . insulin aspart  0-5 Units Subcutaneous QHS  . niacin  1,000 mg Oral QHS  . senna  2 tablet Oral QHS  . tamsulosin  0.4 mg Oral BID  . vitamin B-12  1,000 mcg Oral Daily  . Warfarin - Pharmacist Dosing Inpatient   Does not apply q1800   Continuous Infusions: . sodium chloride 20 mL/hr at 03/10/13 2157  . sodium chloride irrigation Stopped (03/08/13 1000)    Principal Problem:   Acute-on-chronic kidney injury, baseline CKD stage 3 Active Problems:   Chronic anticoagulation   History of  pulmonary embolism   Essential hypertension   Gross hematuria   Preoperative clearance   Acute blood loss anemia   Hypotension, unspecified   Bradycardia   Perineal pain   1St degree AV block    Time spent: 30 min    Collen Hostler, [facility] Pager [phone]. If 7PM-7AM, please contact night-coverage at [email or web address], password [facility] 03/11/2013, 12:08 PM  LOS: 9 days

## 2013-03-12 DIAGNOSIS — I10 Essential (primary) hypertension: Secondary | ICD-10-CM

## 2013-03-12 DIAGNOSIS — R310 Gross hematuria: Secondary | ICD-10-CM

## 2013-03-12 DIAGNOSIS — N189 Chronic kidney disease, unspecified: Secondary | ICD-10-CM

## 2013-03-12 DIAGNOSIS — N179 Acute kidney failure, unspecified: Secondary | ICD-10-CM

## 2013-03-12 LAB — BASIC METABOLIC PANEL
BUN: 23 mg/dL (ref 6–23)
CALCIUM: 8.6 mg/dL (ref 8.4–10.5)
CO2: 24 meq/L (ref 19–32)
CREATININE: 1.46 mg/dL — AB (ref 0.50–1.35)
Chloride: 105 mEq/L (ref 96–112)
GFR calc Af Amer: 54 mL/min — ABNORMAL LOW (ref >90)
GFR calc non Af Amer: 47 mL/min — ABNORMAL LOW (ref >90)
Glucose, Bld: 150 mg/dL — ABNORMAL HIGH (ref 70–99)
Potassium: 4.2 mEq/L (ref 3.7–5.3)
Sodium: 139 mEq/L (ref 137–147)

## 2013-03-12 LAB — PROTIME-INR
INR: 1.8 — ABNORMAL HIGH (ref 0.00–1.49)
PROTHROMBIN TIME: 20.4 s — AB (ref 11.6–15.2)

## 2013-03-12 LAB — CBC
HEMATOCRIT: 28.1 % — AB (ref 39.0–52.0)
Hemoglobin: 8.7 g/dL — ABNORMAL LOW (ref 13.0–17.0)
MCH: 24.3 pg — AB (ref 26.0–34.0)
MCHC: 31 g/dL (ref 30.0–36.0)
MCV: 78.5 fL (ref 78.0–100.0)
Platelets: 306 10*3/uL (ref 150–400)
RBC: 3.58 MIL/uL — ABNORMAL LOW (ref 4.22–5.81)
RDW: 17.7 % — ABNORMAL HIGH (ref 11.5–15.5)
WBC: 5 10*3/uL (ref 4.0–10.5)

## 2013-03-12 MED ORDER — HYDRALAZINE HCL 25 MG PO TABS: 75.0000 mg | ORAL_TABLET | Freq: Three times a day (TID) | ORAL | Status: DC

## 2013-03-12 MED ORDER — CIPROFLOXACIN HCL 500 MG PO TABS: 500.0000 mg | ORAL_TABLET | Freq: Two times a day (BID) | ORAL | Status: DC

## 2013-03-12 MED ORDER — WARFARIN SODIUM 7.5 MG PO TABS
7.5000 mg | ORAL_TABLET | Freq: Once | ORAL | Status: DC
  Filled 2013-03-12: qty 1

## 2013-03-12 MED ORDER — WARFARIN SODIUM 7.5 MG PO TABS: 7.5000 mg | ORAL_TABLET | Freq: Once | ORAL | Status: DC

## 2013-03-12 MED ORDER — CARVEDILOL 3.125 MG PO TABS: 3.1250 mg | ORAL_TABLET | Freq: Two times a day (BID) | ORAL | Status: DC

## 2013-03-12 MED ORDER — CIPROFLOXACIN HCL 500 MG PO TABS
500.0000 mg | ORAL_TABLET | Freq: Two times a day (BID) | ORAL | Status: DC
  Filled 2013-03-12 (×2): qty 1

## 2013-03-12 NOTE — Discharge Instructions (Signed)
DISCHARGE INSTRUCTIONS FOR TRANSURETHRAL SURGERY OF BLADDER  MEDICATIONS:   1. Hold warfarin Thursday & Friday. Restart at 5 mg on Saturday. Recheck INR on Monday.  2. Resume all your other meds from home.  3. If you were taking finasteride and/or tamsulosin, you should continue these medications.  ACTIVITY 1. No heavy lifting >10 pounds for 2 weeks 2. No sexual activity for 2 weeks 3. No strenuous activity for 2 weeks 4. No driving while on narcotic pain medications 5. Drink plenty of water 6. Continue to walk at home - you can still get blood clots when you are at  home, so keep active, but don't over do it. 7. Your urine may have some blood in it - make sure you drink plenty of water,  call or come to the ER immediately if your catheter stops draining  FOLEY CATHETER  (If you go home with a catheter in place.) 1. Make sure your catheter is attached to your leg at all times - do not let  anything pull on it 2. If the urine is your tube starts looking dark red or if it stops draining,  call us immediately or come to the ER 3. Drink plenty of water, if you do notice your urine looking darking, sit down,  relax and drink lots of water 4. You will be given a leg bag as well as an overnight bag for your catheter -  MAKE SURE ATTACHED TO YOUR LEG AT ALL TIMES WITH TAPE OR LEG STRAP  BATHING 1. You can shower.  You may take a bath unless you have a Foley catheter in place.  SIGNS/SYMPTOMS TO CALL: 1. Please call us if you have a fever greater than 101.5, uncontrolled  nausea/vomiting, uncontrolled pain, dizziness, unable to urinate, chest pain, shortness of breath, leg swelling, leg pain, or any other concerns  or questions.  You can reach [place] at [phone].  2. Follow up with coumadin level check tomorrow

## 2013-03-12 NOTE — Progress Notes (Signed)
CBI continues at 100cc/hr--pt urine has improved--clear yellow to light brown with trace of bld tinged noted--700 cc emptied from drainage bag

## 2013-03-12 NOTE — Discharge Summary (Addendum)
Physician Discharge Summary  [name] [name] DOB: 04/10/1940 DOA: 03/02/2013  PCP: Melinda Crutch  Admit date: 03/02/2013 Discharge date: 03/12/2013  Time spent: 40 minutes  Recommendations for Outpatient Follow-up:  1. Follow up with PCP in 1-2 weeks 2. Follow up with Dr. Jasmine December in 2-3 weeks for voiding trial 3. Follow up for hyperbaric oxygen on 03/18/13 4. Will need close coumadin level checks especially while on antibiotics  Discharge Diagnoses:  Principal Problem:   Acute-on-chronic kidney injury, baseline CKD stage 3 Active Problems:   Chronic anticoagulation   History of pulmonary embolism   Essential hypertension   Gross hematuria   Preoperative clearance   Acute blood loss anemia   Hypotension, unspecified   Bradycardia   Perineal pain   1St degree AV block   Discharge Condition: Stable  Diet recommendation: Diabetic  Filed Weights   03/10/13 0603 03/11/13 0635 03/12/13 0610  Weight: 150 kg (330 lb 11 oz) 148.2 kg (326 lb 11.6 oz) 146.7 kg (323 lb 6.6 oz)    History of present illness:  The patient is a 73 y.o. 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 The patient was last at their baseline health in early January. Since the second week of January, he has been having intermittent gross hematuria requiring frequent outpatient visits to urology. Despite placement of foley catheter, initiation of 5-alpha reductase inhibitors and bladder irrigation, he continued to have intermittent obstruction. He was admitted on 1/26 to start continuous bladder  irrigation. He underwent cystoscopy with clot evacuation on 1/28 by Dr. Jasmine December with bladder and prostatic urethral biopsies and his obstructions and bleeding have tapered. He continues his anticoagulation with warfarin and his INR has remained near 3. He has received 1 dose of coumadin dosed by pharmacy since admission. Over the last several days, [name] blood pressure has gradually been trending down until he was frankly hypotensive yesterday to 80s/50s. He has also been bradycardic to the 40s and 50s, asymptomatic and a chronic problem for him. Today, his BUN trended up to 33 and his creatinine trended up to 3.44 and RUS demonstrated only mild fullness of the right collecting system without frank hydronephrosis, absent left kidney. Upon review of his medication list, he has not received any NSAIDS or other obvious nephrotoxins, however, he has been getting diuresis with lasix up to twice daily. Ins and outs have been recorded, but are likely inaccurate secondary to his continuous bladder irrigation. He was also anemic to 6.9 and has already been ordered 1 unit PRBC. [name] is being transferred to hospitalist service for ongoing management of hypotension, AKI, bradycardia, and anemia.   Hospital Course:  Gross hematuria, recurrent  - Management per urology  - continuous irrigation restarted with aluminum  - No significant hematuria this AM   Acute on chronic kidney injury, baseline CKD stage 3 with creatinine of ~1.4, likely secondary to dehydration from lasix. No obvious nephrotoxins or contrast exposure. Resolved with hydration.  - RUS without obstruction  - FENa prerenal  - Continue to hold ACEI for minimum of 1-2 weeks  - Renal fx now at baseline   Hypotension likely  secondary to dehydration and resolved with IVF.   HTN  - Avoid norvasc due to LEE  - Increased hydralazine to 75 mg TID  - Continue low dose carvedilol per cardiology to avoid rebound   Bradycardia with 1st degree AV  block and having pauses of > 2 seconds on telemetry despite stopping BB. Appears to have bigeminy which may be causing lack of conduction of next beat. HR increases appropriately (>20bpm) with exertion and patient asymptomatic.  - Appreciate cardiology assistance  - Hydralazine increased   HLD, stable. 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  - Coumadin dosed per pharmacy   Severe OSA on CPAP, needs CPAP settings adjusted  - Defer to outpatient   Perineal pain, may be related to recent bladder problems and procedures and resolving  - RUS without obvious abnl to explain pain  - Continue prn oxycodone   Pseudomonas UTI, pansensitive  - Continue ciprofloxacin - to finish on 03/23/13  Constipation, resolved - Continued on colace, senna  - Changed to prn miralax   Acute blood loss anemia, iron deficiency anemia, and B12 deficiency anemia, hemoglobin down to 6.9 today  - Iron studies (and history) suggestive of iron deficiency, continue iron supplements  - B12 deficiency: 110  - consider work up for pernicious anemia as outpatient > defer to PCP  - will give vitamin B12 injection and start daily supplementation  - folate 1213  - SPEP/UPEP pending  - Defer TSH due to acute illness  - Transfused 2 unit PRBC 2/2   Headache  - No focal neurologic findings  - Little relief with tylenol  - Consider toradol  Procedures: Cystoscopy with clot evacuation and bladder and urethral biopsies 1/28  Consultations:  Urology  Discharge Exam: Filed Vitals:   03/11/13 1332 03/11/13 2144 03/12/13 0610 03/12/13 1339  BP: 153/78 186/87 165/94 186/82  Pulse: 52 55 53 67  Temp: 98.9 F (37.2 C) 99 F (37.2 C) 97.9 F (36.6 C) 98.3 F (36.8 C)  TempSrc: Oral Oral Oral Oral  Resp: 16 18 18 20   Height:      Weight:   146.7 kg (323 lb 6.6 oz)   SpO2: 98% 98% 96% 97%    General: awake, in nad Cardiovascular:  regular, s1, s2 Respiratory: normal resp effort, no wheezing  Discharge Instructions       Future Appointments Provider Department Dept Phone   03/18/2013 2:00 PM [facility] [place] [phone]       Medication List    STOP taking these medications       fish oil-omega-3 fatty acids 1000 MG capsule     multivitamin with minerals Tabs tablet     naproxen sodium 220 MG tablet  Commonly known as:  ANAPROX      TAKE these medications       alendronate 70 MG tablet  Commonly known as:  FOSAMAX  Take 1 tablet by mouth every Monday.     aspirin 81 MG tablet  Take 81 mg by mouth at bedtime.     carvedilol 3.125 MG tablet  Commonly known as:  COREG  Take 1 tablet (3.125 mg total) by mouth 2 (two) times daily with a meal.     ciprofloxacin 500 MG tablet  Commonly known as:  CIPRO  Take 1 tablet (500 mg total) by mouth 2 (two) times  daily.     fenofibrate 145 MG tablet  Commonly known as:  TRICOR  Take 145 mg by mouth daily.     ferrous sulfate 325 (65 FE) MG EC tablet  Take 325 mg by mouth daily.     finasteride 5 MG tablet  Commonly known as:  PROSCAR  Take 5 mg by mouth every evening.     furosemide 40 MG tablet  Commonly known as:  LASIX  Take 40 mg by mouth daily.     glucosamine-chondroitin 500-400 MG tablet  Take 1,500 tablets by mouth 2 (two) times daily.     hydrALAZINE 25 MG tablet  Commonly known as:  APRESOLINE  Take 3 tablets (75 mg total) by mouth 3 (three) times daily.     hyoscyamine 0.125 MG tablet  Commonly known as:  LEVSIN, ANASPAZ  Take 1 tablet (0.125 mg total) by mouth every 4 (four) hours as needed (bladder spasms).     Niacin CR 1000 MG Tbcr  Take 1,000 mg by mouth at bedtime.     NOVOLOG MIX 70/30 (70-30) 100 UNIT/ML injection  Generic drug:  insulin aspart protamine- aspart  Inject 70 Units into the skin 2 (two) times daily.     oxybutynin 5 MG tablet  Commonly known as:  DITROPAN   Take 1 tablet (5 mg total) by mouth every 6 (six) hours as needed for bladder spasms.     oxyCODONE-acetaminophen 5-325 MG per tablet  Commonly known as:  PERCOCET  Take 1-2 tablets by mouth every 4 (four) hours as needed.     potassium chloride 10 MEQ tablet  Commonly known as:  K-DUR,KLOR-CON  Take 10 mEq by mouth daily.     quinapril 40 MG tablet  Commonly known as:  ACCUPRIL  Take 40 mg by mouth 2 (two) times daily.     senna-docusate 8.6-50 MG per tablet  Commonly known as:  SENOKOT S  Take 1 tablet by mouth 2 (two) times daily.     tamsulosin 0.4 MG Caps capsule  Commonly known as:  FLOMAX  Take 0.4 mg by mouth 2 (two) times daily.     tamsulosin 0.4 MG Caps capsule  Commonly known as:  FLOMAX  Take 0.4 mg by mouth daily.     warfarin 5 MG tablet  Commonly known as:  COUMADIN  Take 5-7.5 mg by mouth daily. 5 mg on Monday and Thursday and 7.5 mg all other days     warfarin 7.5 MG tablet  Commonly known as:  COUMADIN  Take 1 tablet (7.5 mg total) by mouth one time only at 6 PM.       No Known Allergies Follow-up Information   Follow up with Molli Hazard, MD On 03/09/2013. (10:45 am)    Specialty:  Urology   Contact information:   [facility]  [place] [address] [phone]       Follow up with [facility] On 03/18/2013. (2:00 pm. This is for treatment of your bleeding bladder.)    Specialty:  Wound Care   Contact information:   [address] [place] [address] [place] [address] [phone]      Follow up with [facility]. Schedule an appointment as soon as possible for a visit in 1 week.   Specialty:  Obstetrics and Gynecology       The results of significant diagnostics from this hospitalization (including imaging, microbiology, ancillary and laboratory) are listed  below for reference.    Significant Diagnostic Studies: US Pelvis Limited  03/09/2013   CLINICAL DATA:   Evaluate urinary bladder volume, patient has history of recent bladder wall biopsy  EXAM: US PELVIS LIMITED  TECHNIQUE: Ultrasound examination of the pelvic soft tissues was performed in the area of clinical concern.  COMPARISON:  US RENAL dated 03/06/2013  FINDINGS: The urinary bladder volume is calculated at 614 cc. No definite abnormality of the bladder wall is demonstrated.  IMPRESSION: The urinary bladder volume is approximately 614 cc.   Electronically Signed   By: David  Martinique   On: 03/09/2013 18:34   US Renal  03/06/2013   CLINICAL DATA:  Bladder surgery, evaluate for hydronephrosis  EXAM: RENAL/URINARY TRACT ULTRASOUND COMPLETE  COMPARISON:  CT abdomen pelvis dated 03/02/2013  FINDINGS: Right Kidney:  Length: 16.2 cm. Mild fullness of the renal collecting system with upper pole caliectasis. No frank hydronephrosis.  Left Kidney:  Surgically absent.  Bladder:  Bladder decompressed by indwelling Foley catheter.  IMPRESSION: Mild fullness of the right renal collecting system without frank hydronephrosis.  Left kidney surgically absent.  Bladder decompressed by indwelling Foley catheter.  These results were called by telephone at the time of interpretation on 03/06/2013 at 0950 hours to Dr. Rolan Bucco , who verbally acknowledged these results.   Electronically Signed   By: Julian Hy M.D.   On: 03/06/2013 10:06    Microbiology: Recent Results (from the past 240 hour(s))  SURGICAL PCR SCREEN     Status: None   Collection Time    03/03/13  3:58 PM      Result Value Range Status   MRSA, PCR NEGATIVE  NEGATIVE Final   Staphylococcus aureus NEGATIVE  NEGATIVE Final   Comment:            The Xpert SA Assay (FDA     approved for NASAL specimens     in patients over 76 years of age),     is one component of     a comprehensive surveillance     program.  Test performance has     been validated by [employer] for patients greater     than or equal to 61 year old.     It is not  intended     to diagnose infection nor to     guide or monitor treatment.  URINE CULTURE     Status: None   Collection Time    03/06/13  5:48 PM      Result Value Range Status   Specimen Description URINE, CATHETERIZED   Final   Special Requests NONE   Final   Culture  Setup Time     Final   Value: 03/07/2013 02:57     Performed at [facility]     Final   Value: 85,000 COLONIES/ML     Performed at [facility]   Culture     Final   Value: PSEUDOMONAS AERUGINOSA     Performed at [facility]   Report Status 03/09/2013 FINAL   Final   Organism ID, Bacteria PSEUDOMONAS AERUGINOSA   Final     Labs: Basic Metabolic Panel:  Recent Labs Lab 03/08/13 0550 03/09/13 0459 03/10/13 0450 03/11/13 0455 03/12/13 0532  NA 138 140 141 138 139  K 5.0 5.1 4.6 4.1 4.2  CL 107 107 107 104 105  CO2 22 23 23 24 24   GLUCOSE  146* 140* 120* 140* 150*  BUN 29* 29* 20 19 23   CREATININE 1.93* 1.78* 1.42* 1.35 1.46*  CALCIUM 8.0* 8.2* 8.5 8.5 8.6   Liver Function Tests: No results found for this basename: AST, ALT, ALKPHOS, BILITOT, PROT, ALBUMIN,  in the last 168 hours No results found for this basename: LIPASE, AMYLASE,  in the last 168 hours No results found for this basename: AMMONIA,  in the last 168 hours CBC:  Recent Labs Lab 03/08/13 0550 03/09/13 0459 03/09/13 2153 03/10/13 0450 03/11/13 0455 03/12/13 0532  WBC 6.1 5.5  --  5.6 5.0 5.0  HGB 7.3* 6.9* 8.3* 8.3* 8.5* 8.7*  HCT 23.6* 22.0* 26.1* 26.1* 27.1* 28.1*  MCV 77.4* 77.5*  --  77.4* 78.3 78.5  PLT 267 282  --  298 292 306   Cardiac Enzymes: No results found for this basename: CKTOTAL, CKMB, CKMBINDEX, TROPONINI,  in the last 168 hours BNP: BNP (last 3 results) No results found for this basename: PROBNP,  in the last 8760 hours CBG:  Recent Labs Lab 03/10/13 2211 03/11/13 0726 03/11/13 1207 03/11/13 1744 03/11/13 2200  GLUCAP 127* 140* 163* 150* 116*     Signed:  CHIU, STEPHEN K  [facility] 03/12/2013, 1:43 PM

## 2013-03-12 NOTE — Progress Notes (Signed)
ANTIBIOTIC CONSULT NOTE - Follow Up  Pharmacy Consult for Cipro Indication: UTI  No Known Allergies  Patient Measurements: Height: 5\' 10"  (177.8 cm) Weight: 323 lb 6.6 oz (146.7 kg) IBW/kg (Calculated) : 73  Vital Signs: Temp: 97.9 F (36.6 C) (02/05 0610) Temp src: Oral (02/05 0610) BP: 165/94 mmHg (02/05 0610) Pulse Rate: 53 (02/05 0610) Intake/Output from previous day: 02/04 0701 - 02/05 0700 In: 2480 [P.O.:720; I.V.:260; IV Piggyback:400] Out: 5400 [Urine:5400] Intake/Output from this shift: Total I/O In: 360 [P.O.:360] Out: -   Labs:  Recent Labs  03/10/13 0450 03/11/13 0455 03/12/13 0532  WBC 5.6 5.0 5.0  HGB 8.3* 8.5* 8.7*  PLT 298 292 306  CREATININE 1.42* 1.35 1.46*   Estimated Creatinine Clearance: 67.3 ml/min (by C-G formula based on Cr of 1.46). No results found for this basename: Letta Median, VANCORANDOM, GENTTROUGH, GENTPEAK, GENTRANDOM, TOBRATROUGH, TOBRAPEAK, TOBRARND, AMIKACINPEAK, AMIKACINTROU, AMIKACIN,  in the last 72 hours   Microbiology: 1/30 urine culture: Pseudomonas aeruginosa (pan-sensitive)   Assessment: 73 yoM with prostate Ca, hematuria s/p cysto, bladder biopsy, bladder fulguration 1/28.  Patient was empirically placed on Ceftriaxone for UTI.  Urine cultures resulted growing Pseudomonas (pan-sensitive) so switched to Cipro for treatment of Pseudomonas UTI.  Day #4 Cipro 400 mg IV q12h.  Per MD, planned 14 day course.  Afebrile, WBC WNL  Renal function variable this admission but improving.  SCr 1.46, CrCl~95 ml/min (CG)  Note patient is on chronic warfarin therapy for hx of DVT/PE.  Cipro interacts with warfarin, causing increased INR.  Pharmacy managing warfarin and will be adjusting warfarin doses accordingly.  Goal of Therapy:  Eradication of infection, doses adjusted per renal clearance  Plan:  Switch IV Cipro to 500 mg PO BID since patient improving, tolerating PO, and has remained afebrile.    Hershal Coria 03/12/2013,10:31 AM

## 2013-03-12 NOTE — Progress Notes (Signed)
ANTICOAGULATION CONSULT NOTE - Follow Up  Pharmacy Consult for Warfarin Indication: Hx of PE/DVT  Patient Measurements: Height: 5\' 10"  (177.8 cm) Weight: 323 lb 6.6 oz (146.7 kg) IBW/kg (Calculated) : 73  Labs:  Recent Labs  03/10/13 0450 03/11/13 0455 03/12/13 0532  HGB 8.3* 8.5* 8.7*  HCT 26.1* 27.1* 28.1*  PLT 298 292 306  LABPROT 21.2* 20.7* 20.4*  INR 1.90* 1.84* 1.80*  CREATININE 1.42* 1.35 1.46*   Medications:  Scheduled:  . carvedilol  3.125 mg Oral BID WC  . ciprofloxacin  400 mg Intravenous Q12H  . docusate sodium  100 mg Oral BID  . ferrous sulfate  325 mg Oral TID WC  . finasteride  5 mg Oral QPM  . hydrALAZINE  75 mg Oral TID  . insulin aspart  0-20 Units Subcutaneous TID WC  . insulin aspart  0-5 Units Subcutaneous QHS  . niacin  1,000 mg Oral QHS  . senna  2 tablet Oral QHS  . tamsulosin  0.4 mg Oral BID  . vitamin B-12  1,000 mcg Oral Daily  . Warfarin - Pharmacist Dosing Inpatient   Does not apply q1800   Assessment: 36 yoM with prostate Ca, hematuria. Cysto, bladder biopsy, bladder fulguration 1/28. On chronic warfarin for hx of PE/DVT, home dose 7.5mg  daily except 5mg  Mon,Thur.   Warfarin 5mg  daily ordered by urology from 1/28 with INR 2.85, one dose given 1/28  INR 1/29 and 1/30 > 3, contacted urology-planned to hold Warfarin x 2 days  Hospitalist service taking over care from urology 1/31-ordered Warfarin per Pharmacy  Warfarin 5mg  ordered 1/31, held for hematuria, Warfarin resumed 2/1 with INR 1.98  Hgb 8.7.  Low Hgb 2/2 ABLA, iron deficiency and B12 deficiency.  Iron and B12 supplementation ordered.  INR = 1.80.  Have been cautious with doses due to hematuria, recent supratherapeutic INR, and new drug interaction with Cipro.  Since INR slowly trending down, resumed home dose of 7.5 mg last night.  Will repeat today.  Cipro started 2/2, interacts with warfarin causing increase in INR.    Goal of Therapy:  INR 2-3   Plan:   Warfarin  7.5mg  today @ 1800.    Daily INR.  Patient will require close INR follow up at discharge due to drug interaction with Cipro.  Would start with home dose at discharge (7.5 mg daily except 5 mg Mon/Thurs) since INR currently subtherapeutic.    Hershal Coria, PharmD, BCPS Pager: [phone] 03/12/2013 10:22 AM

## 2013-03-12 NOTE — Progress Notes (Signed)
Urology Progress Note  Subjective:     No acute urologic events overnight. Had some blood tinged urine without obstruction or clots.  Has HA and HTN, but feels better today.    ROS: Negative chest pain or SOB.  Objective:  Patient Vitals for the past 24 hrs:  BP Temp Temp src Pulse Resp SpO2 Weight  03/12/13 0610 165/94 mmHg 97.9 F (36.6 C) Oral 53 18 96 % 146.7 kg (323 lb 6.6 oz)  03/11/13 2144 186/87 mmHg 99 F (37.2 C) Oral 55 18 98 % -  03/11/13 1332 153/78 mmHg 98.9 F (37.2 C) Oral 52 16 98 % -  03/11/13 1124 149/77 mmHg - - 58 - - -    Physical Exam: General:  No acute distress, awake Cardiovascular:    [x]   S1/S2 present, Mild bradycardia.  []   Irregularly irregular Chest:  CTA-B Abdomen:               []  Soft, appropriately TTP  [x]  Soft, NTTP  []  Soft, appropriately TTP, incision(s) clean/dry/intact  Genitourinary: Dorsal slit, negative foreskin erythema. Positive scrotal & foreskin edema has improved. Foley:  Draining clear yellow urine. Off continuous bladder irrigation.    I/O last 3 completed shifts: In: 8832 [P.O.:720; I.V.:480; Other:1900; IV Piggyback:600] Out: 8475 [Urine:8475]  Recent Labs     03/11/13  0455  03/12/13  0532  HGB  8.5*  8.7*  WBC  5.0  5.0  PLT  292  306    Recent Labs     03/11/13  0455  03/12/13  0532  NA  138  139  K  4.1  4.2  CL  104  105  CO2  24  24  BUN  19  23  CREATININE  1.35  1.46*  CALCIUM  8.5  8.6  GFRNONAA  51*  47*  GFRAA  59*  54*     Recent Labs     03/11/13  0455  03/12/13  0532  INR  1.84*  1.80*     No components found with this basename: ABG,     Length of stay: 10 days.  Assessment:  Gross hematuria. Anticoagulated.  Blood loss anemia.  Renal insufficiency.  Surgery 03/04/13: cystoscopy, clot evacuation, bladder/urethral biopsy, bladder fulguration.   Plan: -Hemoglobin remains stable. -Continue carboprost until current bag runs out this morning. -Ok for discharge home  from urology point of view. -Has f/u w/ hyperbaric oxygen 03/18/13; will schedule f/u w/ me in about 2-3 weeks for voiding trial.     Rolan Bucco, MD [phone]

## 2013-03-12 NOTE — Consult Note (Signed)
Spoke to patient and he stated he already has walker for home use.

## 2013-03-12 NOTE — Progress Notes (Signed)
Patient noted with blood tinged urine in foley bag. No clots noted in bag. Urine previous at the beginning of shift was light green to yellowish green, not signs of bleeding noted. Carboprost stopped as scheduled at 2215. Called placed to [employer]. Await follow up return call, will continue to monitor.  Patient denies pain or pressure. SRP, RN, BSN

## 2013-03-13 LAB — GLUCOSE, CAPILLARY
GLUCOSE-CAPILLARY: 135 mg/dL — AB (ref 70–99)
GLUCOSE-CAPILLARY: 115 mg/dL — AB (ref 70–99)

## 2013-03-16 ENCOUNTER — Emergency Department (HOSPITAL_COMMUNITY)
Admission: EM | Admit: 2013-03-16 | Discharge: 2013-03-16 | Disposition: A | Discharge status: Home / Self Care | Payer: BC Managed Care – PPO | Attending: Emergency Medicine | Admitting: Emergency Medicine

## 2013-03-16 ENCOUNTER — Encounter (HOSPITAL_COMMUNITY): Payer: Self-pay | Admitting: Emergency Medicine

## 2013-03-16 DIAGNOSIS — E785 Hyperlipidemia, unspecified: Secondary | ICD-10-CM | POA: Insufficient documentation

## 2013-03-16 DIAGNOSIS — Z79899 Other long term (current) drug therapy: Secondary | ICD-10-CM | POA: Insufficient documentation

## 2013-03-16 DIAGNOSIS — R109 Unspecified abdominal pain: Secondary | ICD-10-CM | POA: Insufficient documentation

## 2013-03-16 DIAGNOSIS — Z86718 Personal history of other venous thrombosis and embolism: Secondary | ICD-10-CM | POA: Insufficient documentation

## 2013-03-16 DIAGNOSIS — N289 Disorder of kidney and ureter, unspecified: Secondary | ICD-10-CM | POA: Insufficient documentation

## 2013-03-16 DIAGNOSIS — G4730 Sleep apnea, unspecified: Secondary | ICD-10-CM | POA: Insufficient documentation

## 2013-03-16 DIAGNOSIS — E119 Type 2 diabetes mellitus without complications: Secondary | ICD-10-CM | POA: Insufficient documentation

## 2013-03-16 DIAGNOSIS — Z7982 Long term (current) use of aspirin: Secondary | ICD-10-CM | POA: Insufficient documentation

## 2013-03-16 DIAGNOSIS — N183 Chronic kidney disease, stage 3 unspecified: Secondary | ICD-10-CM | POA: Insufficient documentation

## 2013-03-16 DIAGNOSIS — I129 Hypertensive chronic kidney disease with stage 1 through stage 4 chronic kidney disease, or unspecified chronic kidney disease: Secondary | ICD-10-CM | POA: Insufficient documentation

## 2013-03-16 DIAGNOSIS — Z7901 Long term (current) use of anticoagulants: Secondary | ICD-10-CM | POA: Insufficient documentation

## 2013-03-16 DIAGNOSIS — N139 Obstructive and reflux uropathy, unspecified: Secondary | ICD-10-CM | POA: Insufficient documentation

## 2013-03-16 DIAGNOSIS — Z87891 Personal history of nicotine dependence: Secondary | ICD-10-CM | POA: Insufficient documentation

## 2013-03-16 DIAGNOSIS — Z86711 Personal history of pulmonary embolism: Secondary | ICD-10-CM | POA: Insufficient documentation

## 2013-03-16 DIAGNOSIS — E669 Obesity, unspecified: Secondary | ICD-10-CM | POA: Insufficient documentation

## 2013-03-16 DIAGNOSIS — Z792 Long term (current) use of antibiotics: Secondary | ICD-10-CM | POA: Insufficient documentation

## 2013-03-16 DIAGNOSIS — Z8546 Personal history of malignant neoplasm of prostate: Secondary | ICD-10-CM | POA: Insufficient documentation

## 2013-03-16 LAB — PROTIME-INR
INR: 1.77 — AB (ref 0.00–1.49)
PROTHROMBIN TIME: 20.1 s — AB (ref 11.6–15.2)

## 2013-03-16 LAB — POCT I-STAT, CHEM 8
BUN: 31 mg/dL — AB (ref 6–23)
CHLORIDE: 104 meq/L (ref 96–112)
Calcium, Ion: 1.21 mmol/L (ref 1.13–1.30)
Creatinine, Ser: 1.7 mg/dL — ABNORMAL HIGH (ref 0.50–1.35)
Glucose, Bld: 139 mg/dL — ABNORMAL HIGH (ref 70–99)
HEMATOCRIT: 31 % — AB (ref 39.0–52.0)
Hemoglobin: 10.5 g/dL — ABNORMAL LOW (ref 13.0–17.0)
POTASSIUM: 4.3 meq/L (ref 3.7–5.3)
SODIUM: 138 meq/L (ref 137–147)
TCO2: 22 mmol/L (ref 0–100)

## 2013-03-16 NOTE — ED Provider Notes (Signed)
CSN: [id number]     Arrival date & time 03/16/13  1624 History   First MD Initiated Contact with Patient 03/16/13 1706     Chief Complaint  Patient presents with  . Urinary Retention     (Consider location/radiation/quality/duration/timing/severity/associated sxs/prior Treatment) HPI Pt presenting with c/o urinary retention with abdominal pain.  Pt has seen blood in his urine.  He has foley in place.  He has had to have cath irrigated multiple times due to blood clots obstructing it.  WAs seen by urology this morning and cath was flowing normally.  Has not had any urine output since 12:30pm today.  No fever/chills.  He takes coumadin for hx of blood clots and states his INR has been checked regularly following his recent hospitalization.  C/o urge to urinate and lower abdominal pain.  No vomiting.  No other sites of bleeding.  There are no other associated systemic symptoms, there are no other alleviating or modifying factors.   Past Medical History  Diagnosis Date  . Obesity   . Phlebitis     Lower extermity  . Pulmonary emboli 2008    submassive, saddle  . Prostate cancer 07/2009  . Sleep apnea     on CPAP  . Hx of echocardiogram 12/04/2010    Normal EF >55% no significant valve disease  . History of stress test 06/27/2009    Low risk and EF of approximately 50%  . DVT (deep venous thrombosis)   . Diabetes mellitus   . Chronic kidney disease, stage 3     baseline creatinine ~1.4  . HLD (hyperlipidemia)   . HTN (hypertension)    Past Surgical History  Procedure Laterality Date  . Nephrectomy  1999    for CA  . Cholecystectomy  1999  . Transurethral resection of bladder tumor N/A 03/04/2013    Procedure: CYSTOSCOPY WITH RIGHT RETROGRADE PYELOGRAM AND BLADDER BIOPSY /CLOT EVACUATION/ BIOPSY PROSTATIC URETHRA WITH FULGERATION ;  Surgeon: Molli Hazard, MD;  Location: [facility];  Service: Urology;  Laterality: N/A;   Family History  Problem Relation Age of Onset  . Cancer  Mother   . Diabetes Father   . Cancer Father    History  Substance Use Topics  . Smoking status: Former Smoker    Quit date: 12/05/1947  . Smokeless tobacco: Never Used  . Alcohol Use: Yes     Comment: occasional beer    Review of Systems ROS reviewed and all otherwise negative except for mentioned in HPI    Allergies  Review of patient's allergies indicates no known allergies.  Home Medications   Current Outpatient Rx  Name  Route  Sig  Dispense  Refill  . alendronate (FOSAMAX) 70 MG tablet   Oral   Take 1 tablet by mouth every Monday.          Marland Kitchen aspirin 81 MG tablet   Oral   Take 81 mg by mouth at bedtime.          . carvedilol (COREG) 3.125 MG tablet   Oral   Take 1 tablet (3.125 mg total) by mouth 2 (two) times daily with a meal.   60 tablet   0   . ciprofloxacin (CIPRO) 500 MG tablet   Oral   Take 1 tablet (500 mg total) by mouth 2 (two) times daily.   24 tablet   0   . fenofibrate (TRICOR) 145 MG tablet   Oral   Take 145 mg by mouth daily.          Marland Kitchen  ferrous sulfate 325 (65 FE) MG EC tablet   Oral   Take 325 mg by mouth daily.         . finasteride (PROSCAR) 5 MG tablet   Oral   Take 5 mg by mouth every evening.         . furosemide (LASIX) 40 MG tablet   Oral   Take 40 mg by mouth daily.         Marland Kitchen glucosamine-chondroitin 500-400 MG tablet   Oral   Take 1,500 tablets by mouth 2 (two) times daily.         . hydrALAZINE (APRESOLINE) 25 MG tablet   Oral   Take 3 tablets (75 mg total) by mouth 3 (three) times daily.           Dispense 30 day supply, zero refills   . hyoscyamine (LEVSIN, ANASPAZ) 0.125 MG tablet   Oral   Take 1 tablet (0.125 mg total) by mouth every 4 (four) hours as needed (bladder spasms).   40 tablet   4   . Niacin CR 1000 MG TBCR   Oral   Take 1,000 mg by mouth at bedtime.          Marland Kitchen NOVOLOG MIX 70/30 (70-30) 100 UNIT/ML injection   Subcutaneous   Inject 70 Units into the skin 2 (two) times  daily.          Marland Kitchen nystatin (MYCOSTATIN/NYSTOP) 100000 UNIT/GM POWD   Topical   Apply topically 2 (two) times daily.         Marland Kitchen oxybutynin (DITROPAN) 5 MG tablet   Oral   Take 1 tablet (5 mg total) by mouth every 6 (six) hours as needed for bladder spasms.   40 tablet   4   . oxyCODONE-acetaminophen (PERCOCET) 5-325 MG per tablet   Oral   Take 1-2 tablets by mouth every 4 (four) hours as needed.   40 tablet   0   . potassium chloride (K-DUR,KLOR-CON) 10 MEQ tablet   Oral   Take 10 mEq by mouth daily.         . quinapril (ACCUPRIL) 40 MG tablet   Oral   Take 40 mg by mouth 2 (two) times daily.          Marland Kitchen senna-docusate (SENOKOT S) 8.6-50 MG per tablet   Oral   Take 1 tablet by mouth 2 (two) times daily.   60 tablet   0   . silver sulfADIAZINE (SILVADENE) 1 % cream   Topical   Apply 1 application topically daily.         . tamsulosin (FLOMAX) 0.4 MG CAPS capsule   Oral   Take 0.4 mg by mouth 2 (two) times daily.         Marland Kitchen warfarin (COUMADIN) 5 MG tablet   Oral   Take 5-7.5 mg by mouth daily. 7.5 mg on Monday, Wednesday, and Friday.  Take 5 mg on Tuesday, Thursday, Saturday, and Sunday.          BP 158/65  Pulse 63  Temp(Src) 98.3 F (36.8 C) (Oral)  Resp 18  SpO2 100% Vitals reviewed Physical Exam Physical Examination: General appearance - alert, well appearing, and in no distress Mental status - alert, oriented to person, place, and time Eyes - no conjunctival injection, no scleral icterus Mouth - mucous membranes moist, pharynx normal without lesions Chest - clear to auscultation, no wheezes, rales or rhonchi, symmetric air entry Heart - normal rate, regular rhythm,  normal S1, S2, no murmurs, rubs, clicks or gallops Abdomen - soft, ttp in suprapubic region, nondistended, no masses or organomegaly, nabs GU Male - no penile lesions or discharge, no testicular masses or tenderness, no hernias, foley cath in place Extremities - peripheral pulses  normal, chronic appearing bilateral edema of lower extremities, no clubbing or cyanosis Skin - normal coloration and turgor, no rashes  ED Course  Procedures (including critical care time)  7:47 PM pt feeling much improved, foley bag is filling with urine and blood, draining well.   Labs Review Labs Reviewed  PROTIME-INR - Abnormal; Notable for the following:    Prothrombin Time 20.1 (*)    INR 1.77 (*)    All other components within normal limits  POCT I-STAT, CHEM 8 - Abnormal; Notable for the following:    BUN 31 (*)    Creatinine, Ser 1.70 (*)    Glucose, Bld 139 (*)    Hemoglobin 10.5 (*)    HCT 31.0 (*)    All other components within normal limits   Imaging Review No results found.  EKG Interpretation   None       MDM   Final diagnoses:  Urinary obstruction  Renal insufficiency    Pt presenting with c/o urinary retention and obstruction of foley.  Foley flushed and blood clot came out, followed by grossly bloody urine.  Pt not interested in continuous irrigation.  He states this has happened numerous times in the past and he feels improved.  Pt has mild renal insufficiency which is somewhat higher than his baseline.  He was advised to have this rechecked in the next week.  INR is 1.77.  Discharged with strict return precautions.  Pt agreeable with plan.    Threasa Beards, MD 03/17/13 [phone]

## 2013-03-16 NOTE — Discharge Instructions (Signed)
Return to the ED with any concerns including difficulty passing urine, abdominal pain, fever/chills, decreased level of alertness/lethargy, or any other alarming symptoms  Your creatinine was 1.7 and should be rechecked in the next week  Your INR was 1.77

## 2013-03-16 NOTE — ED Notes (Signed)
Pt states he has had urinary retention for the past 4 hours. Pt has blood in urine. Pt states he had surgury on bladder a month ago and has had foley since.

## 2013-03-18 ENCOUNTER — Ambulatory Visit (HOSPITAL_COMMUNITY)
Admission: RE | Admit: 2013-03-18 | Discharge: 2013-03-18 | Disposition: A | Discharge status: Home / Self Care | Payer: BC Managed Care – PPO | Source: Ambulatory Visit | Attending: General Surgery | Admitting: General Surgery

## 2013-03-18 ENCOUNTER — Encounter (HOSPITAL_BASED_OUTPATIENT_CLINIC_OR_DEPARTMENT_OTHER): Payer: BC Managed Care – PPO | Attending: General Surgery

## 2013-03-18 ENCOUNTER — Other Ambulatory Visit (HOSPITAL_BASED_OUTPATIENT_CLINIC_OR_DEPARTMENT_OTHER): Payer: Self-pay | Admitting: General Surgery

## 2013-03-18 DIAGNOSIS — N3040 Irradiation cystitis without hematuria: Secondary | ICD-10-CM | POA: Diagnosis present

## 2013-03-18 DIAGNOSIS — I10 Essential (primary) hypertension: Secondary | ICD-10-CM | POA: Diagnosis not present

## 2013-03-18 DIAGNOSIS — Z794 Long term (current) use of insulin: Secondary | ICD-10-CM | POA: Diagnosis not present

## 2013-03-18 DIAGNOSIS — Z79899 Other long term (current) drug therapy: Secondary | ICD-10-CM | POA: Diagnosis not present

## 2013-03-18 DIAGNOSIS — Z86711 Personal history of pulmonary embolism: Secondary | ICD-10-CM | POA: Insufficient documentation

## 2013-03-18 DIAGNOSIS — Y842 Radiological procedure and radiotherapy as the cause of abnormal reaction of the patient, or of later complication, without mention of misadventure at the time of the procedure: Secondary | ICD-10-CM | POA: Insufficient documentation

## 2013-03-18 DIAGNOSIS — Z139 Encounter for screening, unspecified: Secondary | ICD-10-CM

## 2013-03-18 DIAGNOSIS — Z85820 Personal history of malignant melanoma of skin: Secondary | ICD-10-CM | POA: Insufficient documentation

## 2013-03-18 DIAGNOSIS — Z85528 Personal history of other malignant neoplasm of kidney: Secondary | ICD-10-CM | POA: Insufficient documentation

## 2013-03-18 DIAGNOSIS — E119 Type 2 diabetes mellitus without complications: Secondary | ICD-10-CM | POA: Diagnosis not present

## 2013-03-18 DIAGNOSIS — Z7982 Long term (current) use of aspirin: Secondary | ICD-10-CM | POA: Insufficient documentation

## 2013-03-18 DIAGNOSIS — E669 Obesity, unspecified: Secondary | ICD-10-CM | POA: Insufficient documentation

## 2013-03-18 DIAGNOSIS — Z905 Acquired absence of kidney: Secondary | ICD-10-CM | POA: Diagnosis not present

## 2013-03-18 DIAGNOSIS — K449 Diaphragmatic hernia without obstruction or gangrene: Secondary | ICD-10-CM | POA: Insufficient documentation

## 2013-03-18 DIAGNOSIS — C61 Malignant neoplasm of prostate: Secondary | ICD-10-CM | POA: Insufficient documentation

## 2013-03-18 DIAGNOSIS — T66XXXS Radiation sickness, unspecified, sequela: Secondary | ICD-10-CM | POA: Diagnosis not present

## 2013-03-18 DIAGNOSIS — Z01818 Encounter for other preprocedural examination: Secondary | ICD-10-CM | POA: Insufficient documentation

## 2013-03-18 DIAGNOSIS — Z7901 Long term (current) use of anticoagulants: Secondary | ICD-10-CM | POA: Insufficient documentation

## 2013-03-19 NOTE — Progress Notes (Signed)
[facility]  NAME:  [name], [name] NO.:  [id number]  MEDICAL RECORD NO.:  [id number]      DATE OF BIRTH:  [birth year]  PHYSICIAN:  Judene Companion, M.D.      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 Interestingly enough, he has also 10 years ago had a renal cell carcinoma and successfully underwent a radical nephrectomy.  He has also had malignant melanoma with no recurrence.  He has a history of hypertension and type 2 diabetes.  He also has a history of a pulmonary embolism for which he is on Coumadin at this time.  We are seeing him in reference to hyperbaric oxygen therapy for his continued hemorrhage from the bladder after the radiation.  Today, he was found to have a blood pressure of 134/77, respirations 18, pulse 69, temperature 98.  He is quite obese, weighing 325 pounds.  His medicines are aspirin, carvedilol, cephalexin, finasteride, fish oil, hydrocodone, K-Lor, metoprolol, niacin.  He takes NovoLog 30/70 35 units twice a day.  He also takes 7.5 mg of warfarin.  So, I am applying to his insurance, which is [employer] for permission to give this man hyperbaric oxygen therapy for hemorrhagic cystitis from the radiation.     Judene Companion, M.D.     PP/MEDQ  D:  03/18/2013  T:  03/19/2013  Job:  [id number]

## 2013-03-25 DIAGNOSIS — N3040 Irradiation cystitis without hematuria: Secondary | ICD-10-CM | POA: Diagnosis not present

## 2013-03-26 DIAGNOSIS — N3040 Irradiation cystitis without hematuria: Secondary | ICD-10-CM | POA: Diagnosis not present

## 2013-03-26 LAB — GLUCOSE, CAPILLARY
GLUCOSE-CAPILLARY: 144 mg/dL — AB (ref 70–99)
GLUCOSE-CAPILLARY: 93 mg/dL (ref 70–99)
GLUCOSE-CAPILLARY: 119 mg/dL — AB (ref 70–99)
GLUCOSE-CAPILLARY: 120 mg/dL — AB (ref 70–99)
GLUCOSE-CAPILLARY: 141 mg/dL — AB (ref 70–99)
Glucose-Capillary: 173 mg/dL — ABNORMAL HIGH (ref 70–99)

## 2013-03-27 DIAGNOSIS — N3040 Irradiation cystitis without hematuria: Secondary | ICD-10-CM | POA: Diagnosis not present

## 2013-03-27 LAB — GLUCOSE, CAPILLARY
Glucose-Capillary: 145 mg/dL — ABNORMAL HIGH (ref 70–99)
Glucose-Capillary: 116 mg/dL — ABNORMAL HIGH (ref 70–99)

## 2013-03-30 DIAGNOSIS — N3040 Irradiation cystitis without hematuria: Secondary | ICD-10-CM | POA: Diagnosis not present

## 2013-03-30 LAB — GLUCOSE, CAPILLARY
Glucose-Capillary: 150 mg/dL — ABNORMAL HIGH (ref 70–99)
Glucose-Capillary: 136 mg/dL — ABNORMAL HIGH (ref 70–99)

## 2013-03-31 DIAGNOSIS — N3040 Irradiation cystitis without hematuria: Secondary | ICD-10-CM | POA: Diagnosis not present

## 2013-03-31 LAB — GLUCOSE, CAPILLARY
GLUCOSE-CAPILLARY: 140 mg/dL — AB (ref 70–99)
GLUCOSE-CAPILLARY: 131 mg/dL — AB (ref 70–99)

## 2013-04-01 DIAGNOSIS — N3040 Irradiation cystitis without hematuria: Secondary | ICD-10-CM | POA: Diagnosis not present

## 2013-04-01 LAB — GLUCOSE, CAPILLARY
GLUCOSE-CAPILLARY: 203 mg/dL — AB (ref 70–99)
Glucose-Capillary: 147 mg/dL — ABNORMAL HIGH (ref 70–99)

## 2013-04-03 ENCOUNTER — Inpatient Hospital Stay (HOSPITAL_COMMUNITY)
Admission: EM | Admit: 2013-04-03 | Discharge: 2013-04-09 | DRG: 669 | Disposition: A | Discharge status: Home / Self Care | Payer: BC Managed Care – PPO | Attending: Family Medicine | Admitting: Family Medicine

## 2013-04-03 ENCOUNTER — Encounter (HOSPITAL_COMMUNITY): Payer: Self-pay | Admitting: Emergency Medicine

## 2013-04-03 DIAGNOSIS — R011 Cardiac murmur, unspecified: Secondary | ICD-10-CM

## 2013-04-03 DIAGNOSIS — N3289 Other specified disorders of bladder: Secondary | ICD-10-CM | POA: Diagnosis present

## 2013-04-03 DIAGNOSIS — I10 Essential (primary) hypertension: Secondary | ICD-10-CM | POA: Diagnosis not present

## 2013-04-03 DIAGNOSIS — I959 Hypotension, unspecified: Secondary | ICD-10-CM

## 2013-04-03 DIAGNOSIS — R001 Bradycardia, unspecified: Secondary | ICD-10-CM

## 2013-04-03 DIAGNOSIS — C61 Malignant neoplasm of prostate: Secondary | ICD-10-CM | POA: Diagnosis not present

## 2013-04-03 DIAGNOSIS — I440 Atrioventricular block, first degree: Secondary | ICD-10-CM

## 2013-04-03 DIAGNOSIS — N039 Chronic nephritic syndrome with unspecified morphologic changes: Secondary | ICD-10-CM

## 2013-04-03 DIAGNOSIS — N189 Chronic kidney disease, unspecified: Secondary | ICD-10-CM | POA: Diagnosis present

## 2013-04-03 DIAGNOSIS — Z7982 Long term (current) use of aspirin: Secondary | ICD-10-CM | POA: Diagnosis not present

## 2013-04-03 DIAGNOSIS — D62 Acute posthemorrhagic anemia: Secondary | ICD-10-CM | POA: Diagnosis present

## 2013-04-03 DIAGNOSIS — Z936 Other artificial openings of urinary tract status: Secondary | ICD-10-CM

## 2013-04-03 DIAGNOSIS — Z8546 Personal history of malignant neoplasm of prostate: Secondary | ICD-10-CM

## 2013-04-03 DIAGNOSIS — Z86711 Personal history of pulmonary embolism: Secondary | ICD-10-CM | POA: Diagnosis not present

## 2013-04-03 DIAGNOSIS — Z905 Acquired absence of kidney: Secondary | ICD-10-CM

## 2013-04-03 DIAGNOSIS — Z833 Family history of diabetes mellitus: Secondary | ICD-10-CM

## 2013-04-03 DIAGNOSIS — I129 Hypertensive chronic kidney disease with stage 1 through stage 4 chronic kidney disease, or unspecified chronic kidney disease: Secondary | ICD-10-CM | POA: Diagnosis present

## 2013-04-03 DIAGNOSIS — Y842 Radiological procedure and radiotherapy as the cause of abnormal reaction of the patient, or of later complication, without mention of misadventure at the time of the procedure: Secondary | ICD-10-CM | POA: Diagnosis present

## 2013-04-03 DIAGNOSIS — Z85528 Personal history of other malignant neoplasm of kidney: Secondary | ICD-10-CM

## 2013-04-03 DIAGNOSIS — Z01818 Encounter for other preprocedural examination: Secondary | ICD-10-CM

## 2013-04-03 DIAGNOSIS — IMO0002 Reserved for concepts with insufficient information to code with codable children: Secondary | ICD-10-CM

## 2013-04-03 DIAGNOSIS — E1169 Type 2 diabetes mellitus with other specified complication: Secondary | ICD-10-CM

## 2013-04-03 DIAGNOSIS — I82509 Chronic embolism and thrombosis of unspecified deep veins of unspecified lower extremity: Secondary | ICD-10-CM | POA: Diagnosis present

## 2013-04-03 DIAGNOSIS — D649 Anemia, unspecified: Secondary | ICD-10-CM

## 2013-04-03 DIAGNOSIS — E669 Obesity, unspecified: Secondary | ICD-10-CM | POA: Diagnosis not present

## 2013-04-03 DIAGNOSIS — N183 Chronic kidney disease, stage 3 unspecified: Secondary | ICD-10-CM | POA: Diagnosis present

## 2013-04-03 DIAGNOSIS — Z794 Long term (current) use of insulin: Secondary | ICD-10-CM

## 2013-04-03 DIAGNOSIS — E785 Hyperlipidemia, unspecified: Secondary | ICD-10-CM | POA: Diagnosis present

## 2013-04-03 DIAGNOSIS — I2782 Chronic pulmonary embolism: Secondary | ICD-10-CM | POA: Diagnosis present

## 2013-04-03 DIAGNOSIS — T66XXXS Radiation sickness, unspecified, sequela: Secondary | ICD-10-CM | POA: Diagnosis not present

## 2013-04-03 DIAGNOSIS — N3040 Irradiation cystitis without hematuria: Secondary | ICD-10-CM | POA: Diagnosis present

## 2013-04-03 DIAGNOSIS — R600 Localized edema: Secondary | ICD-10-CM

## 2013-04-03 DIAGNOSIS — E118 Type 2 diabetes mellitus with unspecified complications: Secondary | ICD-10-CM

## 2013-04-03 DIAGNOSIS — Z87891 Personal history of nicotine dependence: Secondary | ICD-10-CM

## 2013-04-03 DIAGNOSIS — Z6841 Body Mass Index (BMI) 40.0 and over, adult: Secondary | ICD-10-CM

## 2013-04-03 DIAGNOSIS — Z9089 Acquired absence of other organs: Secondary | ICD-10-CM

## 2013-04-03 DIAGNOSIS — Z85820 Personal history of malignant melanoma of skin: Secondary | ICD-10-CM | POA: Diagnosis not present

## 2013-04-03 DIAGNOSIS — R339 Retention of urine, unspecified: Secondary | ICD-10-CM | POA: Diagnosis present

## 2013-04-03 DIAGNOSIS — Z79899 Other long term (current) drug therapy: Secondary | ICD-10-CM | POA: Diagnosis not present

## 2013-04-03 DIAGNOSIS — E1165 Type 2 diabetes mellitus with hyperglycemia: Secondary | ICD-10-CM | POA: Diagnosis present

## 2013-04-03 DIAGNOSIS — I872 Venous insufficiency (chronic) (peripheral): Secondary | ICD-10-CM

## 2013-04-03 DIAGNOSIS — N179 Acute kidney failure, unspecified: Secondary | ICD-10-CM | POA: Diagnosis present

## 2013-04-03 DIAGNOSIS — K449 Diaphragmatic hernia without obstruction or gangrene: Secondary | ICD-10-CM | POA: Diagnosis present

## 2013-04-03 DIAGNOSIS — Z7901 Long term (current) use of anticoagulants: Secondary | ICD-10-CM | POA: Diagnosis not present

## 2013-04-03 DIAGNOSIS — R310 Gross hematuria: Secondary | ICD-10-CM | POA: Diagnosis present

## 2013-04-03 DIAGNOSIS — D631 Anemia in chronic kidney disease: Secondary | ICD-10-CM | POA: Diagnosis present

## 2013-04-03 DIAGNOSIS — R102 Pelvic and perineal pain: Secondary | ICD-10-CM

## 2013-04-03 DIAGNOSIS — G4733 Obstructive sleep apnea (adult) (pediatric): Secondary | ICD-10-CM | POA: Diagnosis present

## 2013-04-03 DIAGNOSIS — E119 Type 2 diabetes mellitus without complications: Secondary | ICD-10-CM | POA: Diagnosis not present

## 2013-04-03 DIAGNOSIS — D500 Iron deficiency anemia secondary to blood loss (chronic): Secondary | ICD-10-CM | POA: Diagnosis present

## 2013-04-03 LAB — BASIC METABOLIC PANEL
BUN: 20 mg/dL (ref 6–23)
CO2: 21 mEq/L (ref 19–32)
CREATININE: 1.89 mg/dL — AB (ref 0.50–1.35)
Calcium: 8.9 mg/dL (ref 8.4–10.5)
Chloride: 95 mEq/L — ABNORMAL LOW (ref 96–112)
GFR, EST AFRICAN AMERICAN: 40 mL/min — AB (ref >90)
GFR, EST NON AFRICAN AMERICAN: 34 mL/min — AB (ref >90)
Glucose, Bld: 173 mg/dL — ABNORMAL HIGH (ref 70–99)
POTASSIUM: 4.4 meq/L (ref 3.7–5.3)
Sodium: 132 mEq/L — ABNORMAL LOW (ref 137–147)

## 2013-04-03 LAB — CBC WITH DIFFERENTIAL/PLATELET
BASOS ABS: 0 10*3/uL (ref 0.0–0.1)
BASOS PCT: 0 % (ref 0–1)
Eosinophils Absolute: 0.1 10*3/uL (ref 0.0–0.7)
Eosinophils Relative: 1 % (ref 0–5)
HCT: 22.2 % — ABNORMAL LOW (ref 39.0–52.0)
Hemoglobin: 6.8 g/dL — CL (ref 13.0–17.0)
Lymphocytes Relative: 7 % — ABNORMAL LOW (ref 12–46)
Lymphs Abs: 0.6 10*3/uL — ABNORMAL LOW (ref 0.7–4.0)
MCH: 23.9 pg — ABNORMAL LOW (ref 26.0–34.0)
MCHC: 30.6 g/dL (ref 30.0–36.0)
MCV: 77.9 fL — ABNORMAL LOW (ref 78.0–100.0)
MONO ABS: 0.6 10*3/uL (ref 0.1–1.0)
Monocytes Relative: 8 % (ref 3–12)
NEUTROS PCT: 84 % — AB (ref 43–77)
Neutro Abs: 6.8 10*3/uL (ref 1.7–7.7)
Platelets: 338 10*3/uL (ref 150–400)
RBC: 2.85 MIL/uL — ABNORMAL LOW (ref 4.22–5.81)
RDW: 17.9 % — AB (ref 11.5–15.5)
WBC: 8 10*3/uL (ref 4.0–10.5)

## 2013-04-03 LAB — GLUCOSE, CAPILLARY
GLUCOSE-CAPILLARY: 171 mg/dL — AB (ref 70–99)
GLUCOSE-CAPILLARY: 120 mg/dL — AB (ref 70–99)

## 2013-04-03 LAB — PROTIME-INR
INR: 3.1 — ABNORMAL HIGH (ref 0.00–1.49)
Prothrombin Time: 30.8 seconds — ABNORMAL HIGH (ref 11.6–15.2)

## 2013-04-03 NOTE — ED Notes (Signed)
Bladder scan at bedside.

## 2013-04-03 NOTE — ED Notes (Signed)
Pt arrived to the ED with a complaint of urinary retention.  Pt has a catheter in place.  Pt had prostate cancer and has been in the hospital for the last 11 days.  Pt began to have urinary retention starting to 1330.  Pt states the catheter has been in place for 30 days and is scheduled for replacement in a week.

## 2013-04-03 NOTE — ED Provider Notes (Signed)
CSN: [id number]     Arrival date & time 04/03/13  1952 History   First MD Initiated Contact with Patient 04/03/13 2138     Chief Complaint  Patient presents with  . Urinary Retention   HPI Patient presents to emergency room with complaints of urinary retention. Patient had transurethral resection of a bladder tumor in January of this year. Following that the patient has had difficulty with persistent hematuria and urinary retention associated with clot formation within his catheter.  Patient states he has noticed decreased urine output starting about 1:30 PM. Patient tried irrigating the catheter at home without significant relief.  Patient denies any fevers or chills. He denies any vomiting or diarrhea. Denies any other complaints. Past Medical History  Diagnosis Date  . Obesity   . Phlebitis     Lower extermity  . Pulmonary emboli 2008    submassive, saddle  . Prostate cancer 07/2009  . Sleep apnea     on CPAP  . Hx of echocardiogram 12/04/2010    Normal EF >55% no significant valve disease  . History of stress test 06/27/2009    Low risk and EF of approximately 50%  . DVT (deep venous thrombosis)   . Diabetes mellitus   . Chronic kidney disease, stage 3     baseline creatinine ~1.4  . HLD (hyperlipidemia)   . HTN (hypertension)    Past Surgical History  Procedure Laterality Date  . Nephrectomy  1999    for CA  . Cholecystectomy  1999  . Transurethral resection of bladder tumor N/A 03/04/2013    Procedure: CYSTOSCOPY WITH RIGHT RETROGRADE PYELOGRAM AND BLADDER BIOPSY /CLOT EVACUATION/ BIOPSY PROSTATIC URETHRA WITH FULGERATION ;  Surgeon: Milford Cage, MD;  Location: [facility];  Service: Urology;  Laterality: N/A;   Family History  Problem Relation Age of Onset  . Cancer Mother   . Diabetes Father   . Cancer Father    History  Substance Use Topics  . Smoking status: Former Smoker    Quit date: 12/05/1947  . Smokeless tobacco: Never Used  . Alcohol Use: Yes      Comment: occasional beer    Review of Systems  All other systems reviewed and are negative.      Allergies  Review of patient's allergies indicates no known allergies.  Home Medications   Current Outpatient Rx  Name  Route  Sig  Dispense  Refill  . alendronate (FOSAMAX) 70 MG tablet   Oral   Take 1 tablet by mouth every Monday.          Marland Kitchen aspirin 81 MG tablet   Oral   Take 81 mg by mouth at bedtime.          . carvedilol (COREG) 3.125 MG tablet   Oral   Take 1 tablet (3.125 mg total) by mouth 2 (two) times daily with a meal.   60 tablet   0   . fenofibrate (TRICOR) 145 MG tablet   Oral   Take 145 mg by mouth daily.          . ferrous sulfate 325 (65 FE) MG EC tablet   Oral   Take 325 mg by mouth daily.         . finasteride (PROSCAR) 5 MG tablet   Oral   Take 5 mg by mouth every evening.         . furosemide (LASIX) 40 MG tablet   Oral   Take 40 mg by  mouth daily.         . hydrALAZINE (APRESOLINE) 25 MG tablet   Oral   Take 3 tablets (75 mg total) by mouth 3 (three) times daily.           Dispense 30 day supply, zero refills   . hyoscyamine (LEVSIN, ANASPAZ) 0.125 MG tablet   Oral   Take 1 tablet (0.125 mg total) by mouth every 4 (four) hours as needed (bladder spasms).   40 tablet   4   . Niacin CR 1000 MG TBCR   Oral   Take 1,000 mg by mouth at bedtime.          Marland Kitchen NOVOLOG MIX 70/30 (70-30) 100 UNIT/ML injection   Subcutaneous   Inject 70 Units into the skin 2 (two) times daily.          Marland Kitchen nystatin (MYCOSTATIN/NYSTOP) 100000 UNIT/GM POWD   Topical   Apply topically 2 (two) times daily.         Marland Kitchen oxybutynin (DITROPAN) 5 MG tablet   Oral   Take 1 tablet (5 mg total) by mouth every 6 (six) hours as needed for bladder spasms.   40 tablet   4   . oxyCODONE-acetaminophen (PERCOCET) 5-325 MG per tablet   Oral   Take 1-2 tablets by mouth every 4 (four) hours as needed.   40 tablet   0   . potassium chloride  (K-DUR,KLOR-CON) 10 MEQ tablet   Oral   Take 10 mEq by mouth daily.         . quinapril (ACCUPRIL) 40 MG tablet   Oral   Take 40 mg by mouth 2 (two) times daily.          Marland Kitchen senna-docusate (SENOKOT-S) 8.6-50 MG per tablet   Oral   Take 1 tablet by mouth daily.         . silver sulfADIAZINE (SILVADENE) 1 % cream   Topical   Apply 1 application topically daily.         . tamsulosin (FLOMAX) 0.4 MG CAPS capsule   Oral   Take 0.4 mg by mouth 2 (two) times daily.         Marland Kitchen warfarin (COUMADIN) 5 MG tablet   Oral   Take 5-7.5 mg by mouth daily. 5 mg on Monday, Wednesday, and Friday.  Take 7.55 mg on Tuesday, Thursday, Saturday, and Sunday.          BP 120/62  Pulse 90  Temp(Src) 97.9 F (36.6 C) (Oral)  Resp 16  SpO2 97% Physical Exam  Nursing note and vitals reviewed. Constitutional: No distress.  Obese  HENT:  Head: Normocephalic and atraumatic.  Right Ear: External ear normal.  Left Ear: External ear normal.  Eyes: Conjunctivae are normal. Right eye exhibits no discharge. Left eye exhibits no discharge. No scleral icterus.  Neck: Neck supple. No tracheal deviation present.  Cardiovascular: Normal rate, regular rhythm and intact distal pulses.   Pulmonary/Chest: Effort normal and breath sounds normal. No stridor. No respiratory distress. He has no wheezes. He has no rales.  Abdominal: Soft. Bowel sounds are normal. He exhibits no distension. There is no tenderness. There is no rebound and no guarding.  Genitourinary:  Foley catheter in place, dark red colored urine in the catheter and the bag  Musculoskeletal: He exhibits no edema and no tenderness.  Neurological: He is alert. He has normal strength. No cranial nerve deficit (no facial droop, extraocular movements intact, no slurred speech) or sensory  deficit. He exhibits normal muscle tone. He displays no seizure activity. Coordination normal.  Skin: Skin is warm and dry. No rash noted. He is not diaphoretic.   Psychiatric: He has a normal mood and affect.    ED Course  Procedures (including critical care time) Labs Review Labs Reviewed  CBC WITH DIFFERENTIAL - Abnormal; Notable for the following:    RBC 2.85 (*)    Hemoglobin 6.8 (*)    HCT 22.2 (*)    MCV 77.9 (*)    MCH 23.9 (*)    RDW 17.9 (*)    Neutrophils Relative % 84 (*)    Lymphocytes Relative 7 (*)    Lymphs Abs 0.6 (*)    All other components within normal limits  BASIC METABOLIC PANEL - Abnormal; Notable for the following:    Sodium 132 (*)    Chloride 95 (*)    Glucose, Bld 173 (*)    Creatinine, Ser 1.89 (*)    GFR calc non Af Amer 34 (*)    GFR calc Af Amer 40 (*)    All other components within normal limits  PROTIME-INR - Abnormal; Notable for the following:    Prothrombin Time 30.8 (*)    INR 3.10 (*)    All other components within normal limits      MDM   Final diagnoses:  Urinary retention    Nursing attempted to irrigate foley.  Little return and still with >300 cc in bladder on bladder scan.  D/w Dr Jasmine December.  Recommends changing foley catheter.  Replace with same size, 3 way 22 gauge.  Labs returned showing a decrease in his hemoglobin.  Will admit for blood transfusion.  Pt agrees with plan.    Kathalene Frames, MD 04/04/13 [phone]

## 2013-04-03 NOTE — ED Notes (Signed)
Attempted foley irrigation, easily able to flush in, scant red fluid returned.  EDP made aware.

## 2013-04-03 NOTE — ED Notes (Signed)
Initial Contact - pt resting on stretcher, changed to hospital gown, pt reports urinary retention since approx 1300 today.  Pt with indwelling foley, due for change this week.  Pt reports passing blood and clots earlier and unable to pass urine at this time.  Pt sts on coumadin.  Skin PWD.  MAEI, self repositioning for comfort.  NAD.

## 2013-04-03 NOTE — ED Notes (Signed)
2nd attempt at foley irrigation with approx 17ml dark red fluid returned.  Pt tolerated procedure well.  Per Dr. Tomi Bamberger and urology consult foley catheter to be changed at this time.  OR called to obtain correct foley.  Pt aware of plan.  NAD.

## 2013-04-04 ENCOUNTER — Encounter (HOSPITAL_COMMUNITY): Payer: Self-pay | Admitting: Internal Medicine

## 2013-04-04 DIAGNOSIS — R310 Gross hematuria: Secondary | ICD-10-CM

## 2013-04-04 DIAGNOSIS — D631 Anemia in chronic kidney disease: Secondary | ICD-10-CM | POA: Diagnosis present

## 2013-04-04 DIAGNOSIS — G4733 Obstructive sleep apnea (adult) (pediatric): Secondary | ICD-10-CM

## 2013-04-04 DIAGNOSIS — D62 Acute posthemorrhagic anemia: Secondary | ICD-10-CM

## 2013-04-04 DIAGNOSIS — R609 Edema, unspecified: Secondary | ICD-10-CM

## 2013-04-04 DIAGNOSIS — N189 Chronic kidney disease, unspecified: Secondary | ICD-10-CM

## 2013-04-04 DIAGNOSIS — R339 Retention of urine, unspecified: Secondary | ICD-10-CM

## 2013-04-04 DIAGNOSIS — D649 Anemia, unspecified: Secondary | ICD-10-CM

## 2013-04-04 LAB — GLUCOSE, CAPILLARY
GLUCOSE-CAPILLARY: 62 mg/dL — AB (ref 70–99)
GLUCOSE-CAPILLARY: 68 mg/dL — AB (ref 70–99)
GLUCOSE-CAPILLARY: 55 mg/dL — AB (ref 70–99)
GLUCOSE-CAPILLARY: 82 mg/dL (ref 70–99)
Glucose-Capillary: 150 mg/dL — ABNORMAL HIGH (ref 70–99)
Glucose-Capillary: 140 mg/dL — ABNORMAL HIGH (ref 70–99)
Glucose-Capillary: 84 mg/dL (ref 70–99)
Glucose-Capillary: 118 mg/dL — ABNORMAL HIGH (ref 70–99)
Glucose-Capillary: 86 mg/dL (ref 70–99)

## 2013-04-04 LAB — COMPREHENSIVE METABOLIC PANEL
ALT: 18 U/L (ref 0–53)
AST: 24 U/L (ref 0–37)
Albumin: 2.7 g/dL — ABNORMAL LOW (ref 3.5–5.2)
Alkaline Phosphatase: 26 U/L — ABNORMAL LOW (ref 39–117)
BUN: 19 mg/dL (ref 6–23)
CO2: 22 mEq/L (ref 19–32)
CREATININE: 1.61 mg/dL — AB (ref 0.50–1.35)
Calcium: 8.7 mg/dL (ref 8.4–10.5)
Chloride: 98 mEq/L (ref 96–112)
GFR calc Af Amer: 48 mL/min — ABNORMAL LOW (ref >90)
GFR, EST NON AFRICAN AMERICAN: 41 mL/min — AB (ref >90)
Glucose, Bld: 141 mg/dL — ABNORMAL HIGH (ref 70–99)
Potassium: 4.2 mEq/L (ref 3.7–5.3)
Sodium: 134 mEq/L — ABNORMAL LOW (ref 137–147)
Total Bilirubin: 0.3 mg/dL (ref 0.3–1.2)
Total Protein: 5.4 g/dL — ABNORMAL LOW (ref 6.0–8.3)

## 2013-04-04 LAB — IRON AND TIBC
Iron: 14 ug/dL — ABNORMAL LOW (ref 42–135)
SATURATION RATIOS: 4 % — AB (ref 20–55)
TIBC: 315 ug/dL (ref 215–435)
UIBC: 301 ug/dL (ref 125–400)

## 2013-04-04 LAB — PHOSPHORUS: Phosphorus: 3.6 mg/dL (ref 2.3–4.6)

## 2013-04-04 LAB — CBC
HCT: 19.8 % — ABNORMAL LOW (ref 39.0–52.0)
HCT: 23.6 % — ABNORMAL LOW (ref 39.0–52.0)
HEMOGLOBIN: 7.7 g/dL — AB (ref 13.0–17.0)
Hemoglobin: 6.2 g/dL — CL (ref 13.0–17.0)
MCH: 24.5 pg — AB (ref 26.0–34.0)
MCH: 25.8 pg — ABNORMAL LOW (ref 26.0–34.0)
MCHC: 31.3 g/dL (ref 30.0–36.0)
MCHC: 32.6 g/dL (ref 30.0–36.0)
MCV: 78.3 fL (ref 78.0–100.0)
MCV: 78.9 fL (ref 78.0–100.0)
PLATELETS: 317 10*3/uL (ref 150–400)
Platelets: 293 10*3/uL (ref 150–400)
RBC: 2.53 MIL/uL — AB (ref 4.22–5.81)
RBC: 2.99 MIL/uL — AB (ref 4.22–5.81)
RDW: 18.2 % — AB (ref 11.5–15.5)
RDW: 17.6 % — AB (ref 11.5–15.5)
WBC: 6.8 10*3/uL (ref 4.0–10.5)
WBC: 5.9 10*3/uL (ref 4.0–10.5)

## 2013-04-04 LAB — RETICULOCYTES
RBC.: 2.67 MIL/uL — ABNORMAL LOW (ref 4.22–5.81)
RETIC CT PCT: 3.2 % — AB (ref 0.4–3.1)
Retic Count, Absolute: 85.4 10*3/uL (ref 19.0–186.0)

## 2013-04-04 LAB — VITAMIN B12: VITAMIN B 12: 184 pg/mL — AB (ref 211–911)

## 2013-04-04 LAB — FOLATE: Folate: >20 ng/mL

## 2013-04-04 LAB — TSH: TSH: 1.316 u[IU]/mL (ref 0.350–4.500)

## 2013-04-04 LAB — PREPARE RBC (CROSSMATCH)

## 2013-04-04 LAB — MAGNESIUM: Magnesium: 2.2 mg/dL (ref 1.5–2.5)

## 2013-04-04 LAB — FERRITIN: Ferritin: 16 ng/mL — ABNORMAL LOW (ref 22–322)

## 2013-04-04 MED ORDER — ASPIRIN 81 MG PO CHEW
81.0000 mg | CHEWABLE_TABLET | Freq: Every day | ORAL | Status: DC
  Administered 2013-04-04 – 2013-04-08 (×5): 81 mg via ORAL
  Filled 2013-04-04 (×6): qty 1

## 2013-04-04 MED ORDER — TAMSULOSIN HCL 0.4 MG PO CAPS
0.4000 mg | ORAL_CAPSULE | Freq: Two times a day (BID) | ORAL | Status: DC
  Administered 2013-04-04 – 2013-04-09 (×11): 0.4 mg via ORAL
  Filled 2013-04-04 (×12): qty 1

## 2013-04-04 MED ORDER — OXYCODONE-ACETAMINOPHEN 5-325 MG PO TABS
1.0000 | ORAL_TABLET | ORAL | Status: DC | PRN
  Administered 2013-04-04 (×2): 2 via ORAL
  Administered 2013-04-07 – 2013-04-08 (×5): 1 via ORAL
  Administered 2013-04-08 – 2013-04-09 (×5): 2 via ORAL
  Filled 2013-04-04 (×2): qty 2
  Filled 2013-04-04: qty 1
  Filled 2013-04-04: qty 2
  Filled 2013-04-04 (×4): qty 1
  Filled 2013-04-04 (×5): qty 2

## 2013-04-04 MED ORDER — HYDRALAZINE HCL 50 MG PO TABS
75.0000 mg | ORAL_TABLET | Freq: Three times a day (TID) | ORAL | Status: DC
  Administered 2013-04-04 – 2013-04-09 (×15): 75 mg via ORAL
  Filled 2013-04-04 (×19): qty 1

## 2013-04-04 MED ORDER — LISINOPRIL 40 MG PO TABS
40.0000 mg | ORAL_TABLET | Freq: Two times a day (BID) | ORAL | Status: DC
  Administered 2013-04-04 – 2013-04-09 (×11): 40 mg via ORAL
  Filled 2013-04-04 (×13): qty 1

## 2013-04-04 MED ORDER — DEXTROSE 50 % IV SOLN
INTRAVENOUS | Status: AC
  Administered 2013-04-04: 25 mL
  Filled 2013-04-04: qty 50

## 2013-04-04 MED ORDER — INSULIN ASPART PROT & ASPART (70-30 MIX) 100 UNIT/ML ~~LOC~~ SUSP
50.0000 [IU] | Freq: Two times a day (BID) | SUBCUTANEOUS | Status: DC
  Administered 2013-04-05 – 2013-04-06 (×2): 50 [IU] via SUBCUTANEOUS
  Filled 2013-04-04: qty 10

## 2013-04-04 MED ORDER — SODIUM CHLORIDE 0.9 % IV SOLN: 250.0000 mL | INTRAVENOUS | Status: DC | PRN

## 2013-04-04 MED ORDER — FUROSEMIDE 40 MG PO TABS
40.0000 mg | ORAL_TABLET | Freq: Every day | ORAL | Status: DC
  Administered 2013-04-04 – 2013-04-09 (×6): 40 mg via ORAL
  Filled 2013-04-04 (×6): qty 1

## 2013-04-04 MED ORDER — SODIUM CHLORIDE 0.9 % IV SOLN: 500.0000 mL | Freq: Once | INTRAVENOUS | Status: DC

## 2013-04-04 MED ORDER — DOCUSATE SODIUM 100 MG PO CAPS
100.0000 mg | ORAL_CAPSULE | Freq: Two times a day (BID) | ORAL | Status: DC
  Administered 2013-04-04 – 2013-04-09 (×10): 100 mg via ORAL
  Filled 2013-04-04 (×12): qty 1

## 2013-04-04 MED ORDER — INSULIN ASPART 100 UNIT/ML ~~LOC~~ SOLN: 0.0000 [IU] | Freq: Every day | SUBCUTANEOUS | Status: DC

## 2013-04-04 MED ORDER — FUROSEMIDE 10 MG/ML IJ SOLN: 20.0000 mg | Freq: Once | INTRAMUSCULAR | Status: DC

## 2013-04-04 MED ORDER — ACETAMINOPHEN 325 MG PO TABS: 650.0000 mg | ORAL_TABLET | Freq: Once | ORAL | Status: DC

## 2013-04-04 MED ORDER — SODIUM CHLORIDE 0.9 % IJ SOLN
3.0000 mL | Freq: Two times a day (BID) | INTRAMUSCULAR | Status: DC
  Administered 2013-04-05 – 2013-04-08 (×5): 3 mL via INTRAVENOUS

## 2013-04-04 MED ORDER — FERROUS SULFATE 325 (65 FE) MG PO TABS
325.0000 mg | ORAL_TABLET | Freq: Every day | ORAL | Status: DC
  Administered 2013-04-04 – 2013-04-07 (×4): 325 mg via ORAL
  Filled 2013-04-04 (×5): qty 1

## 2013-04-04 MED ORDER — DIPHENHYDRAMINE HCL 25 MG PO CAPS
25.0000 mg | ORAL_CAPSULE | Freq: Once | ORAL | Status: AC
  Administered 2013-04-06: 25 mg via ORAL
  Filled 2013-04-04: qty 1

## 2013-04-04 MED ORDER — INSULIN ASPART PROT & ASPART (70-30 MIX) 100 UNIT/ML ~~LOC~~ SUSP
70.0000 [IU] | Freq: Two times a day (BID) | SUBCUTANEOUS | Status: DC
  Administered 2013-04-04: 50 [IU] via SUBCUTANEOUS
  Administered 2013-04-04: 70 [IU] via SUBCUTANEOUS
  Filled 2013-04-04: qty 10

## 2013-04-04 MED ORDER — SODIUM CHLORIDE 0.9 % IJ SOLN: 3.0000 mL | INTRAMUSCULAR | Status: DC | PRN

## 2013-04-04 MED ORDER — FINASTERIDE 5 MG PO TABS
5.0000 mg | ORAL_TABLET | Freq: Every evening | ORAL | Status: DC
  Administered 2013-04-04 – 2013-04-08 (×5): 5 mg via ORAL
  Filled 2013-04-04 (×6): qty 1

## 2013-04-04 MED ORDER — ONDANSETRON HCL 4 MG PO TABS: 4.0000 mg | ORAL_TABLET | Freq: Four times a day (QID) | ORAL | Status: DC | PRN

## 2013-04-04 MED ORDER — SODIUM CHLORIDE 0.9 % IR SOLN
3000.0000 mL | Status: DC
  Administered 2013-04-04 – 2013-04-05 (×18): 3000 mL

## 2013-04-04 MED ORDER — CARVEDILOL 3.125 MG PO TABS
3.1250 mg | ORAL_TABLET | Freq: Two times a day (BID) | ORAL | Status: DC
  Administered 2013-04-04 – 2013-04-09 (×11): 3.125 mg via ORAL
  Filled 2013-04-04 (×15): qty 1

## 2013-04-04 MED ORDER — ACETAMINOPHEN 325 MG PO TABS: 650.0000 mg | ORAL_TABLET | Freq: Four times a day (QID) | ORAL | Status: DC | PRN

## 2013-04-04 MED ORDER — HYOSCYAMINE SULFATE 0.125 MG PO TABS
0.1250 mg | ORAL_TABLET | ORAL | Status: DC | PRN
  Filled 2013-04-04: qty 1

## 2013-04-04 MED ORDER — INSULIN ASPART 100 UNIT/ML ~~LOC~~ SOLN
0.0000 [IU] | Freq: Three times a day (TID) | SUBCUTANEOUS | Status: DC
  Administered 2013-04-04: 3 [IU] via SUBCUTANEOUS
  Administered 2013-04-05 – 2013-04-07 (×2): 4 [IU] via SUBCUTANEOUS
  Administered 2013-04-07 – 2013-04-08 (×3): 3 [IU] via SUBCUTANEOUS

## 2013-04-04 MED ORDER — QUINAPRIL HCL 10 MG PO TABS: 40.0000 mg | ORAL_TABLET | Freq: Two times a day (BID) | ORAL | Status: DC

## 2013-04-04 MED ORDER — SODIUM CHLORIDE 0.9 % IJ SOLN
3.0000 mL | Freq: Two times a day (BID) | INTRAMUSCULAR | Status: DC
  Administered 2013-04-04 – 2013-04-06 (×3): 3 mL via INTRAVENOUS
  Administered 2013-04-08: 23:00:00 via INTRAVENOUS

## 2013-04-04 MED ORDER — OXYBUTYNIN CHLORIDE 5 MG PO TABS
5.0000 mg | ORAL_TABLET | Freq: Four times a day (QID) | ORAL | Status: DC | PRN
  Administered 2013-04-04 – 2013-04-09 (×4): 5 mg via ORAL
  Filled 2013-04-04 (×6): qty 1

## 2013-04-04 MED ORDER — ACETAMINOPHEN 650 MG RE SUPP: 650.0000 mg | Freq: Four times a day (QID) | RECTAL | Status: DC | PRN

## 2013-04-04 MED ORDER — ONDANSETRON HCL 4 MG/2ML IJ SOLN: 4.0000 mg | Freq: Four times a day (QID) | INTRAMUSCULAR | Status: DC | PRN

## 2013-04-04 NOTE — Progress Notes (Signed)
Hypoglycemic Event  CBG: 68  Treatment: 15 GM carbohydrate snack  Symptoms: None  Follow-up CBG: Time:2120 CBG Result:62  Possible Reasons for Event: Unknown  Comments/MD notified: Pt still hypoglycemic following snack. Asymptomatic. Pt states he drinks orange juice at home when blood sugar is low. Orange juice given. Will check follow up blood sugar.    [name] K  Remember to initiate Hypoglycemia Order Set & complete

## 2013-04-04 NOTE — Progress Notes (Signed)
*  Preliminary Results* Bilateral lower extremity venous duplex completed. Study was technically limited due to patient body habitus and edema. Visualized veins of bilateral lower extremities are negative for deep vein thrombosis. There is no evidence of Baker's cyst bilaterally.  04/04/2013  Maudry Mayhew, RVT, RDCS, RDMS

## 2013-04-04 NOTE — Progress Notes (Signed)
Pt has been hand irrigated x3 since shift change. Several large clots removed, pt tolerated well but experiencing pain and bladder spasms. Percocet and ditropan given.  CBI is running wide open to maintain light pink color. Will continue to monitor. Hara Milholland, Bing Neighbors, RN

## 2013-04-04 NOTE — Progress Notes (Signed)
ANTICOAGULATION CONSULT NOTE - Initial Consult  Pharmacy Consult for warfarin Indication: Hx VTE  No Known Allergies  Patient Measurements: Height: 5\' 10"  (177.8 cm) Weight: 297 lb 13.5 oz (135.1 kg) IBW/kg (Calculated) : 73  Vital Signs: Temp: 98.3 F (36.8 C) (02/28 1500) Temp src: Oral (02/28 1500) BP: 127/60 mmHg (02/28 1500) Pulse Rate: 60 (02/28 1500)  Labs:  Recent Labs  04/03/13 2245 04/04/13 0520  HGB 6.8* 6.2*  HCT 22.2* 19.8*  PLT 338 317  LABPROT 30.8*  --   INR 3.10*  --   CREATININE 1.89* 1.61*    Estimated Creatinine Clearance: 58.2 ml/min (by C-G formula based on Cr of 1.61).   Medical History: Past Medical History  Diagnosis Date  . Obesity   . Phlebitis     Lower extermity  . Pulmonary emboli 2008    submassive, saddle  . Prostate cancer 07/2009  . Sleep apnea     on CPAP  . Hx of echocardiogram 12/04/2010    Normal EF >55% no significant valve disease  . History of stress test 06/27/2009    Low risk and EF of approximately 50%  . DVT (deep venous thrombosis)   . Diabetes mellitus   . Chronic kidney disease, stage 3     baseline creatinine ~1.4  . HLD (hyperlipidemia)   . HTN (hypertension)     Medications:  Scheduled:  . sodium chloride  500 mL Intravenous Once  . acetaminophen  650 mg Oral Once  . aspirin  81 mg Oral QHS  . carvedilol  3.125 mg Oral BID WC  . diphenhydrAMINE  25 mg Oral Once  . docusate sodium  100 mg Oral BID  . ferrous sulfate  325 mg Oral Daily  . finasteride  5 mg Oral QPM  . furosemide  20 mg Intravenous Once  . furosemide  40 mg Oral Daily  . hydrALAZINE  75 mg Oral TID  . insulin aspart  0-20 Units Subcutaneous TID WC  . insulin aspart  0-5 Units Subcutaneous QHS  . insulin aspart protamine- aspart  70 Units Subcutaneous BID  . lisinopril  40 mg Oral BID  . sodium chloride  3 mL Intravenous Q12H  . sodium chloride  3 mL Intravenous Q12H  . tamsulosin  0.4 mg Oral BID   Infusions:  . sodium  chloride irrigation      Assessment: 73 yo male admitted 2/27 with urinary retention on chronic warfarin for Hx saddle PE and residual DVT. Pharmacy has been consulted to continue warfarin while inpatient  Home dose= 5mg  M/W/F and 7.5mg  TTSS, last dose 2/27 INR on admission = 3.10  After discussion with [facility] MD, will hold warfarin tonight due to slightly high INR and significant hematuria.  Hgb 6.2, plts ok 2U PRBC have been ordered.  Goal of Therapy:  INR 2-3 Monitor platelets by anticoagulation protocol: Yes   Plan:  - no warfarin tonight - daily PT/INR - CBC in AM - follow up hematuria, H/H, plts - pharmacy will f/u daily  Thank you for the consult.  Johny Drilling, PharmD, BCPS Pager: [phone] Pharmacy: [phone] 04/04/2013 3:46 PM

## 2013-04-04 NOTE — Progress Notes (Signed)
Hypoglycemic Event  CBG: 58  Treatment: D50 IV 25 mL  Symptoms: None  Follow-up CBG: Time:2215 CBG Result:86  Possible Reasons for Event: Unknown  Comments/MD notified:      Alanson Puls  Remember to initiate Hypoglycemia Order Set & complete

## 2013-04-04 NOTE — Consult Note (Signed)
Urology Consult  Requesting provider:  Dr. Dorie Rank  CC: Gross hematuria.  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 He does not have bladder cancer. He noticed decreased urine output today. He has had intermittent gross hematuria. He is currently undergoing treatment w/ hyperbaric oxygen. Catheter was exchanged by ER staff tonight which has allowed some old clot to drain. Patient has relief. Found to have anemia likely due to blood loss.  PMH: Past Medical History  Diagnosis Date  . Obesity   . Phlebitis     Lower extermity  . Pulmonary emboli 2008    submassive, saddle  . Prostate cancer 07/2009  . Sleep apnea     on CPAP  . Hx of echocardiogram 12/04/2010    Normal EF >55% no significant valve disease  . History of stress test 06/27/2009    Low risk and EF of approximately 50%  . DVT (deep venous thrombosis)   . Diabetes mellitus   . Chronic kidney disease, stage 3     baseline creatinine ~1.4  . HLD (hyperlipidemia)   . HTN (hypertension)     PSH: Past Surgical History  Procedure Laterality Date  . Nephrectomy  1999    for CA  . Cholecystectomy  1999  . Transurethral resection of bladder tumor N/A 03/04/2013    Procedure: CYSTOSCOPY WITH RIGHT RETROGRADE PYELOGRAM AND BLADDER BIOPSY /CLOT EVACUATION/ BIOPSY PROSTATIC URETHRA WITH FULGERATION ;  Surgeon: Molli Hazard, MD;  Location: [facility];  Service: Urology;  Laterality: N/A;    Allergies: No Known Allergies  Medications:  (Not in a hospital admission)   Social History: History   Social History  . Marital Status: Married    Spouse Name: N/A    Number of Children: N/A  . Years of Education: N/A   Occupational History  . Not on file.   Social History Main Topics  . Smoking status: Never Smoker   . Smokeless tobacco: Never Used  . Alcohol Use: Yes     Comment: occasional beer not since 2014  . Drug Use: No  . Sexual Activity: Not on file   Other Topics  Concern  . Not on file   Social History Narrative  . No narrative on file    Family History: Family History  Problem Relation Age of Onset  . Cancer Mother   . Diabetes Father   . Cancer Father     Review of Systems: Positive: Abdominal pain. Negative: Chest pain, nausea.  A further 10 point review of systems was negative except what is listed in the HPI.  Physical Exam: Filed Vitals:   04/04/13 0014  BP: 138/62  Pulse: 68  Temp: 98.1 F (36.7 C)  Resp: 20    General: No acute distress.  Awake. Head:  Normocephalic.  Atraumatic. ENT:  EOMI.  Mucous membranes moist Neck:  Supple.  No lymphadenopathy. CV:  S1 present. S2 present. Regular rate. Pulmonary: Equal effort bilaterally.  Clear to auscultation bilaterally. Abdomen: Soft.  Non- tender to palpation. Skin:  Normal turgor.  No visible rash. Extremity: No gross deformity of bilateral upper extremities.  No gross deformity of    bilateral lower extremities. Neurologic: Alert. Appropriate mood.  GU:  Negative edema. 22Fr 3-way catheter draining dark urine.  Studies:  Recent Labs     04/03/13  2245  HGB  6.8*  WBC  8.0  PLT  338    Recent Labs     04/03/13  2245  NA  132*  K  4.4  CL  95*  CO2  21  BUN  20  CREATININE  1.89*  CALCIUM  8.9  GFRNONAA  34*  GFRAA  40*     Recent Labs     04/03/13  2245  INR  3.10*     No components found with this basename: ABG,     Assessment:  Gross hematuria. Blood loss anemia.  Plan: Agree with admission to internal medicine for transfusion and observation.  I would not start continuous bladder irrigation now as his catheter is draining.  Will follow.    Pager: [phone]    CC: Dr. Dorie Rank

## 2013-04-04 NOTE — H&P (Signed)
Urology Progress Note  Subjective:     Still feels relief and catheter is draining well.    ROS: Negative: SOB or chest pain.  Objective:  Patient Vitals for the past 24 hrs:  BP Temp Temp src Pulse Resp SpO2 Height Weight  04/04/13 1015 136/50 mmHg 98.3 F (36.8 C) Oral 70 18 100 % - -  04/04/13 0915 148/70 mmHg 97.7 F (36.5 C) Oral 76 20 99 % - -  04/04/13 0853 138/71 mmHg 98 F (36.7 C) Oral 67 18 99 % - -  04/04/13 0428 137/67 mmHg 98.6 F (37 C) Oral 71 18 100 % 5\' 10"  (1.778 m) 135.1 kg (297 lb 13.5 oz)  04/04/13 0356 134/67 mmHg 98.3 F (36.8 C) Oral - 20 96 % - -  04/04/13 0014 138/62 mmHg 98.1 F (36.7 C) Oral 68 20 96 % - -  04/03/13 2110 120/62 mmHg - - 90 16 97 % - -  04/03/13 1959 116/75 mmHg 97.9 F (36.6 C) Oral 114 22 100 % - -    Physical Exam: General:  No acute distress, awake Cardiovascular:    [x]   S1/S2 present, RRR  []   Irregularly irregular Chest:  CTA-B Abdomen:               []  Soft, appropriately TTP  [x]  Soft, NTTP  []  Soft, appropriately TTP, incision(s) clean/dry/intact  Genitourinary: Negative edema. Foley in place. Foley:  Draining merlot urine.    I/O last 3 completed shifts: In: -  Out: 500 [Urine:500]  Recent Labs     04/03/13  2245  04/04/13  0520  HGB  6.8*  6.2*  WBC  8.0  6.8  PLT  338  317    Recent Labs     04/03/13  2245  04/04/13  0520  NA  132*  134*  K  4.4  4.2  CL  95*  98  CO2  21  22  BUN  20  19  CREATININE  1.89*  1.61*  CALCIUM  8.9  8.7  GFRNONAA  34*  41*  GFRAA  40*  48*     Recent Labs     04/03/13  2245  INR  3.10*     No components found with this basename: ABG,     Length of stay: 1 days.  Assessment: Gross hematuria. Blood loss on chronic anemia.    Plan: Hand irrigated today with return of a moderate amount of clot. Urine does not clear to pink so I suspect that he still has an old, large clot in his bladder.  Will initiate continuous bladder irrigation (CBI). Nurse  to hand irrigate PRN. Titrate to keep urine light pink.  No surgical intervention needed at this point, but based on his anatomy from previous surgery, I would need the extra-long [employer] resection scope and I have therefore notified the OR to call the [employer] rep to bring the extra-long set in house in case this is needed emergently.  Will follow. Appreciate [facility] for managing this patient.   Rolan Bucco, MD [phone]

## 2013-04-04 NOTE — H&P (Signed)
PCP:  [facility]  Urology Jasmine December Cardiology: Claiborne Billings   Chief Complaint:  Foley Catheter blocked  HPI: [name] is a 73 y.o. male   has a past medical history of Obesity; Phlebitis; Pulmonary emboli (2008); Prostate cancer (07/2009); Sleep apnea; echocardiogram (12/04/2010); History of stress test (06/27/2009); DVT (deep venous thrombosis); Diabetes mellitus; Chronic kidney disease, stage 3; HLD (hyperlipidemia); and HTN (hypertension).   Presented with  Patient with hx of prostate cancer diagnosed 3 years ago has hx of radiation induced hemorrhagic cystitis  and chronic hematuria since January treated with hyperbaric treatments. Patient has indwelling foley that has become obstructed today with blood clots. The foley was replaced per urology recomendations but patient was incidentally found to have Hg of 6.8 on his blood work. Patient is on chronic coumadin forhx of DVT and submassive saddle PE  5 years ago.  States he had recent doppler done that showed residual DVT's. In ER patient is to have blood transfusion hospitalist was called for admission.  January patient had similar admission requiring continuous bladder irrigation and clot evacuation. He had required transfusions in the past.  Denies chest pain or shortness of breath. He has hx of OSA and is currently on CPAP machine.  Denies blood in stool, reports black stools that he attributes to IRON  Pills.   Review of Systems:    Pertinent positives include: hematuria  Constitutional:  No weight loss, night sweats, Fevers, chills, fatigue, weight loss  HEENT:  No headaches, Difficulty swallowing,Tooth/dental problems,Sore throat,  No sneezing, itching, ear ache, nasal congestion, post nasal drip,  Cardio-vascular:  No chest pain, Orthopnea, PND, anasarca, dizziness, palpitations.no Bilateral lower extremity swelling  GI:  No heartburn, indigestion, abdominal pain, nausea, vomiting, diarrhea, change in bowel habits, loss of  appetite, melena, blood in stool, hematemesis Resp:  no shortness of breath at rest. No dyspnea on exertion, No excess mucus, no productive cough, No non-productive cough, No coughing up of blood.No change in color of mucus.No wheezing. Skin:  no rash or lesions. No jaundice GU:  no dysuria, change in color of urine, no urgency or frequency. No straining to urinate.  No flank pain.  Musculoskeletal:  No joint pain or no joint swelling. No decreased range of motion. No back pain.  Psych:  No change in mood or affect. No depression or anxiety. No memory loss.  Neuro: no localizing neurological complaints, no tingling, no weakness, no double vision, no gait abnormality, no slurred speech, no confusion  Otherwise ROS are negative except for above, 10 systems were reviewed  Past Medical History: Past Medical History  Diagnosis Date  . Obesity   . Phlebitis     Lower extermity  . Pulmonary emboli 2008    submassive, saddle  . Prostate cancer 07/2009  . Sleep apnea     on CPAP  . Hx of echocardiogram 12/04/2010    Normal EF >55% no significant valve disease  . History of stress test 06/27/2009    Low risk and EF of approximately 50%  . DVT (deep venous thrombosis)   . Diabetes mellitus   . Chronic kidney disease, stage 3     baseline creatinine ~1.4  . HLD (hyperlipidemia)   . HTN (hypertension)    Past Surgical History  Procedure Laterality Date  . Nephrectomy  1999    for CA  . Cholecystectomy  1999  . Transurethral resection of bladder tumor N/A 03/04/2013    Procedure: CYSTOSCOPY WITH RIGHT RETROGRADE PYELOGRAM AND BLADDER BIOPSY /  CLOT EVACUATION/ BIOPSY PROSTATIC URETHRA WITH FULGERATION ;  Surgeon: Molli Hazard, MD;  Location: [facility];  Service: Urology;  Laterality: N/A;     Medications: Prior to Admission medications   Medication Sig Start Date End Date Taking? Authorizing Provider  alendronate (FOSAMAX) 70 MG tablet Take 1 tablet by mouth every Monday.   11/24/12  Yes Historical Provider, MD  aspirin 81 MG tablet Take 81 mg by mouth at bedtime.    Yes Historical Provider, MD  carvedilol (COREG) 3.125 MG tablet Take 1 tablet (3.125 mg total) by mouth 2 (two) times daily with a meal. 03/12/13  Yes Donne Hazel, MD  fenofibrate (TRICOR) 145 MG tablet Take 145 mg by mouth daily.  10/18/12  Yes Historical Provider, MD  ferrous sulfate 325 (65 FE) MG EC tablet Take 325 mg by mouth daily.   Yes Historical Provider, MD  finasteride (PROSCAR) 5 MG tablet Take 5 mg by mouth every evening.   Yes Historical Provider, MD  furosemide (LASIX) 40 MG tablet Take 40 mg by mouth daily.   Yes Historical Provider, MD  hydrALAZINE (APRESOLINE) 25 MG tablet Take 3 tablets (75 mg total) by mouth 3 (three) times daily. 03/12/13  Yes Donne Hazel, MD  hyoscyamine (LEVSIN, ANASPAZ) 0.125 MG tablet Take 1 tablet (0.125 mg total) by mouth every 4 (four) hours as needed (bladder spasms). 03/05/13  Yes Molli Hazard, MD  Niacin CR 1000 MG TBCR Take 1,000 mg by mouth at bedtime.  11/06/12  Yes Historical Provider, MD  NOVOLOG MIX 70/30 (70-30) 100 UNIT/ML injection Inject 70 Units into the skin 2 (two) times daily.  11/06/12  Yes Historical Provider, MD  nystatin (MYCOSTATIN/NYSTOP) 100000 UNIT/GM POWD Apply topically 2 (two) times daily.   Yes Historical Provider, MD  oxybutynin (DITROPAN) 5 MG tablet Take 1 tablet (5 mg total) by mouth every 6 (six) hours as needed for bladder spasms. 03/05/13  Yes Molli Hazard, MD  oxyCODONE-acetaminophen (PERCOCET) 5-325 MG per tablet Take 1-2 tablets by mouth every 4 (four) hours as needed. 03/05/13  Yes Molli Hazard, MD  potassium chloride (K-DUR,KLOR-CON) 10 MEQ tablet Take 10 mEq by mouth daily.   Yes Historical Provider, MD  quinapril (ACCUPRIL) 40 MG tablet Take 40 mg by mouth 2 (two) times daily.  09/22/12  Yes Historical Provider, MD  senna-docusate (SENOKOT-S) 8.6-50 MG per tablet Take 1 tablet by mouth daily.  03/05/13  Yes Molli Hazard, MD  silver sulfADIAZINE (SILVADENE) 1 % cream Apply 1 application topically daily.   Yes Historical Provider, MD  tamsulosin (FLOMAX) 0.4 MG CAPS capsule Take 0.4 mg by mouth 2 (two) times daily.   Yes Historical Provider, MD  warfarin (COUMADIN) 5 MG tablet Take 5-7.5 mg by mouth daily. 5 mg on Monday, Wednesday, and Friday.  Take 7.55 mg on Tuesday, Thursday, Saturday, and Sunday. 11/13/12  Yes Historical Provider, MD    Allergies:  No Known Allergies  Social History:  Ambulatory   independently   Lives at   Home with family   reports that he has never smoked. He has never used smokeless tobacco. He reports that he drinks alcohol. He reports that he does not use illicit drugs.   Family History: family history includes Cancer in his father and mother; Diabetes in his father.    Physical Exam: Patient Vitals for the past 24 hrs:  BP Temp Temp src Pulse Resp SpO2  04/04/13 0014 138/62 mmHg 98.1 F (36.7 C) Oral  68 20 96 %  04/03/13 2110 120/62 mmHg - - 90 16 97 %  04/03/13 1959 116/75 mmHg 97.9 F (36.6 C) Oral 114 22 100 %    1. General:  in No Acute distress 2. Psychological: Alert and   Oriented 3. Head/ENT:   Moist   Mucous Membranes                          Head Non traumatic, neck supple                          Normal   Dentition 4. SKIN: normal   Skin turgor,  Skin clean Dry and intact no rash 5. Heart: Regular rate and rhythm, systolic Murmur present, Rub or gallop 6. Lungs: Clear to auscultation bilaterally, no wheezes or crackles   7. Abdomen: Soft, non-tender, Non distended 8. Lower extremities: no clubbing, cyanosis, trace edema 9. Neurologically Grossly intact, moving all 4 extremities equally 10. MSK: Normal range of motion  body mass index is unknown because there is no weight on file.   Labs on Admission:   Recent Labs  04/03/13 2245  NA 132*  K 4.4  CL 95*  CO2 21  GLUCOSE 173*  BUN 20  CREATININE 1.89*   CALCIUM 8.9   No results found for this basename: AST, ALT, ALKPHOS, BILITOT, PROT, ALBUMIN,  in the last 72 hours No results found for this basename: LIPASE, AMYLASE,  in the last 72 hours  Recent Labs  04/03/13 2245  WBC 8.0  NEUTROABS 6.8  HGB 6.8*  HCT 22.2*  MCV 77.9*  PLT 338   No results found for this basename: CKTOTAL, CKMB, CKMBINDEX, TROPONINI,  in the last 72 hours No results found for this basename: TSH, T4TOTAL, FREET3, T3FREE, THYROIDAB,  in the last 72 hours No results found for this basename: VITAMINB12, FOLATE, FERRITIN, TIBC, IRON, RETICCTPCT,  in the last 72 hours Lab Results  Component Value Date   HGBA1C  Value: 7.6 (NOTE)   The ADA recommends the following therapeutic goals for glycemic   control related to Hgb A1C measurement:   Goal of Therapy:   < 7.0% Hgb A1C   Action Suggested:  > 8.0% Hgb A1C   Ref:  Diabetes Care, 22, Suppl. 1, 1999* 01/21/2007    The CrCl is unknown because both a height and weight (above a minimum accepted value) are required for this calculation. ABG    Component Value Date/Time   HCO3 27.7* 10/10/2006 1848   TCO2 22 03/16/2013 1830     Lab Results  Component Value Date   DDIMER  Value: 11.48        AT THE INHOUSE ESTABLISHED CUTOFF VALUE OF 0.48 ug/mL FEU, THIS ASSAY HAS BEEN DOCUMENTED IN THE LITERATURE TO HAVE* 10/10/2006      Cultures:    Component Value Date/Time   SDES URINE, CATHETERIZED 03/06/2013 1748   SPECREQUEST NONE 03/06/2013 1748   CULT  Value: PSEUDOMONAS AERUGINOSA Performed at [facility] 03/06/2013 1748   REPTSTATUS 03/09/2013 FINAL 03/06/2013 1748       Radiological Exams on Admission: No results found.  Chart has been reviewed  Assessment/Plan  73 yo M with chronic hematuria on coumadin for hx of PE here with anemia  Present on Admission:  . Anemia - we'll transfuse 2 units of packed red blood cells , obtain anemia panel to evaluate for iron deficiency.  Marland Kitchen  Severe obstructive sleep apnea -  CPAP machine per home settings  . History of pulmonary embolism - continue Coumadin per pharmacy patient is at high risk of clot formation due to his sedentary lifestyle, obesity, history of cancer  . Gross hematuria - as per urology  . Acute-on-chronic kidney injury, baseline CKD stage 3 - a baseline patient status post nephrectomy given history of kidney cancer     Prophylaxis:on coumadin, Protonix  CODE STATUS:FULL CODE  Other plan as per orders.  I have spent a total of 55 min on this admission  Silas Sedam 04/04/2013, 1:00 AM

## 2013-04-04 NOTE — Progress Notes (Signed)
CBI maintained at high flow because urine would become darkened if flow turned down.  Hand irrigated x1 to relieve clots.  Pt comfortable and urine light pink. [name]

## 2013-04-04 NOTE — Progress Notes (Signed)
Patient seen and examined. Database reviewed. Has radiation-induced hemorrhagic cystitis. Catheter was replaced yesterday and seems to be freely-flowing at the moment. No plans for CBI at present. Appreciate GU follow up. Hb is 6.8 and is being transfused 2 units of PRBCs. Follow Hb post-transfusion. Will continue to follow.  Domingo Mend, MD [facility] Pager: [phone]

## 2013-04-04 NOTE — Progress Notes (Signed)
Pt had 6 beats VT at 1300.  Asymptomatic.  Will notify MD. Andre Lefort

## 2013-04-05 DIAGNOSIS — Z7901 Long term (current) use of anticoagulants: Secondary | ICD-10-CM

## 2013-04-05 LAB — GLUCOSE, CAPILLARY
GLUCOSE-CAPILLARY: 110 mg/dL — AB (ref 70–99)
GLUCOSE-CAPILLARY: 68 mg/dL — AB (ref 70–99)
Glucose-Capillary: 80 mg/dL (ref 70–99)
Glucose-Capillary: 126 mg/dL — ABNORMAL HIGH (ref 70–99)
Glucose-Capillary: 164 mg/dL — ABNORMAL HIGH (ref 70–99)
Glucose-Capillary: 123 mg/dL — ABNORMAL HIGH (ref 70–99)

## 2013-04-05 LAB — OCCULT BLOOD X 1 CARD TO LAB, STOOL: Fecal Occult Bld: NEGATIVE

## 2013-04-05 LAB — CBC
HEMATOCRIT: 23.8 % — AB (ref 39.0–52.0)
Hemoglobin: 7.6 g/dL — ABNORMAL LOW (ref 13.0–17.0)
MCH: 25.4 pg — ABNORMAL LOW (ref 26.0–34.0)
MCHC: 31.9 g/dL (ref 30.0–36.0)
MCV: 79.6 fL (ref 78.0–100.0)
Platelets: 297 10*3/uL (ref 150–400)
RBC: 2.99 MIL/uL — ABNORMAL LOW (ref 4.22–5.81)
RDW: 17.7 % — ABNORMAL HIGH (ref 11.5–15.5)
WBC: 5.4 10*3/uL (ref 4.0–10.5)

## 2013-04-05 LAB — MRSA PCR SCREENING: MRSA by PCR: NEGATIVE

## 2013-04-05 LAB — PROTIME-INR
INR: 3.66 — ABNORMAL HIGH (ref 0.00–1.49)
Prothrombin Time: 35 seconds — ABNORMAL HIGH (ref 11.6–15.2)

## 2013-04-05 NOTE — Progress Notes (Signed)
ANTICOAGULATION CONSULT NOTE - Follow up  Pharmacy Consult for warfarin Indication: Hx VTE  No Known Allergies  Patient Measurements: Height: 5\' 10"  (177.8 cm) Weight: 297 lb 13.5 oz (135.1 kg) IBW/kg (Calculated) : 73  Vital Signs: Temp: 97.7 F (36.5 C) (03/01 0454) Temp src: Oral (03/01 0454) BP: 131/65 mmHg (03/01 0454) Pulse Rate: 50 (03/01 0454)  Labs:  Recent Labs  04/03/13 2245 04/04/13 0520 04/04/13 1710 04/05/13 0524  HGB 6.8* 6.2* 7.7* 7.6*  HCT 22.2* 19.8* 23.6* 23.8*  PLT 338 317 293 297  LABPROT 30.8*  --   --  35.0*  INR 3.10*  --   --  3.66*  CREATININE 1.89* 1.61*  --   --     Estimated Creatinine Clearance: 58.2 ml/min (by C-G formula based on Cr of 1.61).  Assessment: 73 yo male admitted 2/27 with urinary retention on chronic warfarin for Hx saddle PE and residual DVT. Pharmacy has been consulted to continue warfarin while inpatient  Home dose= 5mg  M/W/F and 7.5mg  TTSS, last dose 2/27 INR on admission = 3.10  Patient continues with significant hematuria. INR up to 3.66 despite being held since admission Hgb 7.6, plts ok, received 2U PRBC on 2/28 S/p 4U PRBC total  Goal of Therapy:  INR 2-3 Monitor platelets by anticoagulation protocol: Yes   Plan:  - no warfarin tonight - daily PT/INR - CBC in AM - follow up hematuria, H/H, plts - pharmacy will f/u daily  Thank you for the consult.  Johny Drilling, PharmD, BCPS Pager: [phone] Pharmacy: [phone] 04/05/2013 11:02 AM

## 2013-04-05 NOTE — H&P (Signed)
Urology Progress Note  Subjective:     No acute GU events. Required hand irrigation for clot several times over last 24 hours, which is expected.     ROS: Negative: SOB or chest pain.  Objective:  Patient Vitals for the past 24 hrs:  BP Temp Temp src Pulse Resp SpO2  04/05/13 0454 131/65 mmHg 97.7 F (36.5 C) Oral 50 18 99 %  04/04/13 2039 135/60 mmHg 98.1 F (36.7 C) Oral 60 18 100 %  04/04/13 1500 127/60 mmHg 98.3 F (36.8 C) Oral 60 20 97 %  04/04/13 1425 124/62 mmHg 98.1 F (36.7 C) Oral - 16 97 %  04/04/13 1325 129/60 mmHg 98 F (36.7 C) Oral 56 20 97 %  04/04/13 1225 121/63 mmHg 98.3 F (36.8 C) Oral 57 18 97 %  04/04/13 1152 124/68 mmHg 98.3 F (36.8 C) Tympanic 58 20 100 %  04/04/13 1115 118/62 mmHg 98.5 F (36.9 C) Oral 59 18 99 %  04/04/13 1015 136/50 mmHg 98.3 F (36.8 C) Oral 70 18 100 %  04/04/13 0915 148/70 mmHg 97.7 F (36.5 C) Oral 76 20 99 %  04/04/13 0853 138/71 mmHg 98 F (36.7 C) Oral 67 18 99 %    Physical Exam: General:  No acute distress, awake Cardiovascular:    [x]   S1/S2 present, RRR  []   Irregularly irregular Chest:  CTA-B Abdomen:               []  Soft, appropriately TTP  [x]  Soft, NTTP  []  Soft, appropriately TTP, incision(s) clean/dry/intact  Genitourinary: Negative edema. Foley in place. Foley:  Draining clear urine on fast CBI drip.    I/O last 3 completed shifts: In: 63 [Blood:675; Other:52425] Out: 51450 [GEXBM:84132]  Recent Labs     04/04/13  1710  04/05/13  0524  HGB  7.7*  7.6*  WBC  5.9  5.4  PLT  293  297    Recent Labs     04/03/13  2245  04/04/13  0520  NA  132*  134*  K  4.4  4.2  CL  95*  98  CO2  21  22  BUN  20  19  CREATININE  1.89*  1.61*  CALCIUM  8.9  8.7  GFRNONAA  34*  41*  GFRAA  40*  48*     Recent Labs     04/03/13  2245  04/05/13  0524  INR  3.10*  3.66*     No components found with this basename: ABG,     Length of stay: 2 days.  Assessment: Gross hematuria. Blood  loss on chronic anemia.    Plan: I hand irrigated this morning with return of minimal small clot. I suspect that he still has a large clot in the bladder. This should continue to lyse with continuous bladder irrigation and intermittent hand irrigation.   Nursing staff instructed to hand irrigate until no return of clot when catheter becomes obstructed.  When urine irrigates clear w/ hand irrigation, that will be the time to start intravesical carboprost to try to stop any active bleeding. No indication for surgery at this point, but that is possible if this does not improve.  I have explained to the patient/family that this may take several more days to clear up.  Appreciate management by [facility] with anemia. May need another blood transfusion in the future.  Will continue to follow.  Rolan Bucco, MD [phone]

## 2013-04-05 NOTE — Progress Notes (Signed)
Patient's wife brought home CPAP unit in.  Patient will wear his home CPAP unit tonight.

## 2013-04-05 NOTE — Progress Notes (Signed)
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 -Appreciate GU assistance. -Currently on CBI.  ABLA -Transfuse for Hb <7.  PE -Had a large saddle PE and residual DVT. -Makes taking him off coumadin difficult. -Feel like we cannot stop anticoagulation at this point.  Code Status: Full Code Family Communication: Discussed with wife at bedside.  Disposition Plan: Home when ready.    Consultants:  GU   Antibiotics:  none   Subjective: None  Objective: Filed Vitals:   04/04/13 1425 04/04/13 1500 04/04/13 2039 04/05/13 0454  BP: 124/62 127/60 135/60 131/65  Pulse:  60 60 50  Temp: 98.1 F (36.7 C) 98.3 F (36.8 C) 98.1 F (36.7 C) 97.7 F (36.5 C)  TempSrc: Oral Oral Oral Oral  Resp: 16 20 18 18   Height:      Weight:      SpO2: 97% 97% 100% 99%    Intake/Output Summary (Last 24 hours) at 04/05/13 1008 Last data filed at 04/05/13 0843  Gross per 24 hour  Intake 52562.5 ml  Output  52875 ml  Net -312.5 ml   Filed Weights   04/04/13 0428  Weight: 135.1 kg (297 lb 13.5 oz)    Exam:   General:  AA Ox3  Cardiovascular: RRR  Respiratory: CTA B  Abdomen: S/NT/ND/+BS  Extremities: no C/C/E   Neurologic:  Non-focal  Data Reviewed: Basic Metabolic Panel:  Recent Labs Lab 04/03/13 2245 04/04/13 0520  NA 132* 134*  K 4.4 4.2  CL 95* 98  CO2 21 22  GLUCOSE 173* 141*  BUN 20 19  CREATININE 1.89* 1.61*  CALCIUM 8.9 8.7  MG  --  2.2  PHOS  --  3.6   Liver Function Tests:  Recent Labs Lab 04/04/13 0520  AST 24  ALT 18  ALKPHOS 26*  BILITOT 0.3  PROT 5.4*  ALBUMIN 2.7*   No results found for this basename: LIPASE, AMYLASE,  in the last 168 hours No results found for this basename: AMMONIA,  in the last 168 hours CBC:  Recent Labs Lab 04/03/13 2245 04/04/13 0520 04/04/13 1710 04/05/13 0524  WBC 8.0  6.8 5.9 5.4  NEUTROABS 6.8  --   --   --   HGB 6.8* 6.2* 7.7* 7.6*  HCT 22.2* 19.8* 23.6* 23.8*  MCV 77.9* 78.3 78.9 79.6  PLT 338 317 293 297   Cardiac Enzymes: No results found for this basename: CKTOTAL, CKMB, CKMBINDEX, TROPONINI,  in the last 168 hours BNP (last 3 results) No results found for this basename: PROBNP,  in the last 8760 hours CBG:  Recent Labs Lab 04/04/13 2153 04/04/13 2215 04/04/13 2255 04/05/13 0022 04/05/13 0735  GLUCAP 55* 86 82 110* 80    No results found for this or any previous visit (from the past 240 hour(s)).   Studies: No results found.  Scheduled Meds: . sodium chloride  500 mL Intravenous Once  . acetaminophen  650 mg Oral Once  . aspirin  81 mg Oral QHS  . carvedilol  3.125 mg Oral BID WC  . diphenhydrAMINE  25 mg Oral Once  . docusate sodium  100 mg Oral BID  . ferrous sulfate  325 mg Oral Daily  . finasteride  5 mg Oral QPM  . furosemide  20 mg Intravenous Once  . furosemide  40 mg Oral Daily  . hydrALAZINE  75 mg Oral TID  . insulin aspart  0-20  Units Subcutaneous TID WC  . insulin aspart  0-5 Units Subcutaneous QHS  . insulin aspart protamine- aspart  50 Units Subcutaneous BID  . lisinopril  40 mg Oral BID  . sodium chloride  3 mL Intravenous Q12H  . sodium chloride  3 mL Intravenous Q12H  . tamsulosin  0.4 mg Oral BID   Continuous Infusions: . sodium chloride irrigation      Active Problems:   Severe obstructive sleep apnea   Chronic anticoagulation   History of pulmonary embolism   Gross hematuria   Acute-on-chronic kidney injury, baseline CKD stage 3   Anemia    Time spent: 25 minutes. Greater than 50% of this time was spent in direct contact with the patient coordinating care.    Lelon Frohlich  [facility] Pager [phone]  If 7PM-7AM, please contact night-coverage at [email or web address], password [facility] 04/05/2013, 10:08 AM  LOS: 2 days

## 2013-04-06 ENCOUNTER — Encounter (HOSPITAL_BASED_OUTPATIENT_CLINIC_OR_DEPARTMENT_OTHER): Payer: BC Managed Care – PPO | Attending: General Surgery

## 2013-04-06 DIAGNOSIS — Y842 Radiological procedure and radiotherapy as the cause of abnormal reaction of the patient, or of later complication, without mention of misadventure at the time of the procedure: Secondary | ICD-10-CM | POA: Insufficient documentation

## 2013-04-06 DIAGNOSIS — E118 Type 2 diabetes mellitus with unspecified complications: Secondary | ICD-10-CM

## 2013-04-06 DIAGNOSIS — E1165 Type 2 diabetes mellitus with hyperglycemia: Secondary | ICD-10-CM

## 2013-04-06 DIAGNOSIS — IMO0002 Reserved for concepts with insufficient information to code with codable children: Secondary | ICD-10-CM

## 2013-04-06 DIAGNOSIS — N3040 Irradiation cystitis without hematuria: Secondary | ICD-10-CM | POA: Insufficient documentation

## 2013-04-06 DIAGNOSIS — T66XXXS Radiation sickness, unspecified, sequela: Secondary | ICD-10-CM | POA: Insufficient documentation

## 2013-04-06 LAB — CBC
HEMATOCRIT: 22.4 % — AB (ref 39.0–52.0)
HEMOGLOBIN: 7.2 g/dL — AB (ref 13.0–17.0)
MCH: 25.7 pg — AB (ref 26.0–34.0)
MCHC: 32.1 g/dL (ref 30.0–36.0)
MCV: 80 fL (ref 78.0–100.0)
Platelets: 274 10*3/uL (ref 150–400)
RBC: 2.8 MIL/uL — ABNORMAL LOW (ref 4.22–5.81)
RDW: 18 % — AB (ref 11.5–15.5)
WBC: 4.9 10*3/uL (ref 4.0–10.5)

## 2013-04-06 LAB — GLUCOSE, CAPILLARY
GLUCOSE-CAPILLARY: 95 mg/dL (ref 70–99)
GLUCOSE-CAPILLARY: 107 mg/dL — AB (ref 70–99)
GLUCOSE-CAPILLARY: 90 mg/dL (ref 70–99)
GLUCOSE-CAPILLARY: 73 mg/dL (ref 70–99)
GLUCOSE-CAPILLARY: 60 mg/dL — AB (ref 70–99)
GLUCOSE-CAPILLARY: 84 mg/dL (ref 70–99)
Glucose-Capillary: 110 mg/dL — ABNORMAL HIGH (ref 70–99)
Glucose-Capillary: 104 mg/dL — ABNORMAL HIGH (ref 70–99)
Glucose-Capillary: 57 mg/dL — ABNORMAL LOW (ref 70–99)
Glucose-Capillary: 68 mg/dL — ABNORMAL LOW (ref 70–99)

## 2013-04-06 LAB — PROTIME-INR
INR: 3.19 — ABNORMAL HIGH (ref 0.00–1.49)
Prothrombin Time: 31.5 seconds — ABNORMAL HIGH (ref 11.6–15.2)

## 2013-04-06 MED ORDER — DIPHENHYDRAMINE HCL 12.5 MG/5ML PO ELIX: 12.5000 mg | ORAL_SOLUTION | Freq: Every evening | ORAL | Status: DC | PRN

## 2013-04-06 MED ORDER — SODIUM CHLORIDE 0.9 % IR SOLN
3000.0000 mL | Status: DC
  Administered 2013-04-08: 1000 mL via INTRAVESICAL

## 2013-04-06 MED ORDER — CARBOPROST TROMETHAMINE 250 MCG/ML IM SOLN
INTRAMUSCULAR | Status: DC
  Filled 2013-04-06: qty 40

## 2013-04-06 MED ORDER — ZOLPIDEM TARTRATE 5 MG PO TABS
5.0000 mg | ORAL_TABLET | Freq: Every evening | ORAL | Status: DC | PRN
  Administered 2013-04-07 – 2013-04-08 (×2): 5 mg via ORAL
  Filled 2013-04-06 (×2): qty 1

## 2013-04-06 MED ORDER — SODIUM CHLORIDE 0.9 % IR SOLN
3000.0000 mL | Status: AC
  Administered 2013-04-07: 3000 mL via INTRAVESICAL

## 2013-04-06 MED ORDER — HYOSCYAMINE SULFATE 0.125 MG SL SUBL
0.1250 mg | SUBLINGUAL_TABLET | SUBLINGUAL | Status: DC | PRN
  Administered 2013-04-07 – 2013-04-09 (×4): 0.125 mg via SUBLINGUAL
  Filled 2013-04-06 (×4): qty 1

## 2013-04-06 MED ORDER — SODIUM CHLORIDE 0.9 % IR SOLN
Status: DC
  Administered 2013-04-07 – 2013-04-08 (×2): via INTRAVESICAL
  Filled 2013-04-06 (×2): qty 40

## 2013-04-06 MED ORDER — INSULIN ASPART PROT & ASPART (70-30 MIX) 100 UNIT/ML ~~LOC~~ SUSP
45.0000 [IU] | Freq: Two times a day (BID) | SUBCUTANEOUS | Status: DC
  Administered 2013-04-06: 45 [IU] via SUBCUTANEOUS

## 2013-04-06 MED ORDER — SODIUM CHLORIDE 0.9 % IR SOLN: 3000.0000 mL | Status: DC

## 2013-04-06 MED ORDER — SODIUM CHLORIDE 0.9 % IR SOLN
Status: AC
  Administered 2013-04-06: 18:00:00 via INTRAVESICAL
  Filled 2013-04-06: qty 40

## 2013-04-06 NOTE — Progress Notes (Signed)
ANTICOAGULATION CONSULT NOTE - Follow up  Pharmacy Consult for warfarin Indication: Hx VTE  No Known Allergies  Patient Measurements: Height: 5\' 10"  (177.8 cm) Weight: 297 lb 13.5 oz (135.1 kg) IBW/kg (Calculated) : 73  Vital Signs: Temp: 97.8 F (36.6 C) (03/02 0800) Temp src: Oral (03/02 0800) BP: 156/74 mmHg (03/02 0937) Pulse Rate: 59 (03/02 0935)  Labs:  Recent Labs  04/03/13 2245 04/04/13 0520 04/04/13 1710 04/05/13 0524 04/06/13 0326  HGB 6.8* 6.2* 7.7* 7.6* 7.2*  HCT 22.2* 19.8* 23.6* 23.8* 22.4*  PLT 338 317 293 297 274  LABPROT 30.8*  --   --  35.0* 31.5*  INR 3.10*  --   --  3.66* 3.19*  CREATININE 1.89* 1.61*  --   --   --     Estimated Creatinine Clearance: 58.2 ml/min (by C-G formula based on Cr of 1.61).  Assessment: 73 yo male admitted 2/27 with urinary retention on chronic warfarin for Hx saddle PE and residual DVT. Pharmacy has been consulted to continue warfarin while inpatient  Home dose= 5mg  M/W/F and 7.5mg  TTSS, last dose 2/27 INR on admission = 3.10  RN reports pink-tinged urine this morning with clots overnight. INR trending down now but remain supratherapeutic at 3.19 Hgb 7.2, plts ok, received 2U PRBC on 2/28  Goal of Therapy:  INR 2-3 Monitor platelets by anticoagulation protocol: Yes   Plan:  - no warfarin tonight since INR>3 - daily PT/INR - CBC in AM - follow up hematuria, H/H, plts - pharmacy will f/u daily  Hershal Coria, PharmD, BCPS Pager: [phone] 04/06/2013 10:14 AM

## 2013-04-06 NOTE — Progress Notes (Addendum)
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 -Appreciate GU assistance. -Currently on CBI; last hand irrigation was last pm.  ABLA -Transfuse for Hb <7.  PE -Had a large saddle PE and residual DVT. -Makes taking him off coumadin difficult. -Feel like we cannot stop anticoagulation at this point.  DM -Some hypoglycemic episodes. -Will further decrease 70/30 to 45 BID.  Code Status: Full Code Family Communication: Discussed with wife at bedside.  Disposition Plan: Home when ready.    Consultants:  GU   Antibiotics:  none   Subjective: None  Objective: Filed Vitals:   04/06/13 0937 04/06/13 1000 04/06/13 1200 04/06/13 1400  BP: 156/74 177/77 120/56 169/77  Pulse:  60 57 59  Temp:      TempSrc:      Resp:  16 19 20   Height:      Weight:      SpO2:  100% 96% 96%    Intake/Output Summary (Last 24 hours) at 04/06/13 1445 Last data filed at 04/06/13 1418  Gross per 24 hour  Intake  70920 ml  Output  72526 ml  Net  -1606 ml   Filed Weights   04/04/13 0428  Weight: 135.1 kg (297 lb 13.5 oz)    Exam:   General:  AA Ox3  Cardiovascular: RRR  Respiratory: CTA B  Abdomen: S/NT/ND/+BS  Extremities: no C/C/E   Neurologic:  Non-focal  Data Reviewed: Basic Metabolic Panel:  Recent Labs Lab 04/03/13 2245 04/04/13 0520  NA 132* 134*  K 4.4 4.2  CL 95* 98  CO2 21 22  GLUCOSE 173* 141*  BUN 20 19  CREATININE 1.89* 1.61*  CALCIUM 8.9 8.7  MG  --  2.2  PHOS  --  3.6   Liver Function Tests:  Recent Labs Lab 04/04/13 0520  AST 24  ALT 18  ALKPHOS 26*  BILITOT 0.3  PROT 5.4*  ALBUMIN 2.7*   No results found for this basename: LIPASE, AMYLASE,  in the last 168 hours No results found for this basename: AMMONIA,  in the last 168 hours CBC:  Recent Labs Lab 04/03/13 2245 04/04/13 0520 04/04/13 1710  04/05/13 0524 04/06/13 0326  WBC 8.0 6.8 5.9 5.4 4.9  NEUTROABS 6.8  --   --   --   --   HGB 6.8* 6.2* 7.7* 7.6* 7.2*  HCT 22.2* 19.8* 23.6* 23.8* 22.4*  MCV 77.9* 78.3 78.9 79.6 80.0  PLT 338 317 293 297 274   Cardiac Enzymes: No results found for this basename: CKTOTAL, CKMB, CKMBINDEX, TROPONINI,  in the last 168 hours BNP (last 3 results) No results found for this basename: PROBNP,  in the last 8760 hours CBG:  Recent Labs Lab 04/05/13 2253 04/06/13 0332 04/06/13 0747 04/06/13 0958 04/06/13 1309  GLUCAP 95 107* 90 110* 73    Recent Results (from the past 240 hour(s))  MRSA PCR SCREENING     Status: None   Collection Time    04/05/13  6:01 PM      Result Value Ref Range Status   MRSA by PCR NEGATIVE  NEGATIVE Final   Comment:            The GeneXpert MRSA Assay (FDA     approved for NASAL specimens     only), is one component of a     comprehensive MRSA colonization     surveillance program. It is not  intended to diagnose MRSA     infection nor to guide or     monitor treatment for     MRSA infections.     Studies: No results found.  Scheduled Meds: . sodium chloride  500 mL Intravenous Once  . acetaminophen  650 mg Oral Once  . aspirin  81 mg Oral QHS  . carvedilol  3.125 mg Oral BID WC  . docusate sodium  100 mg Oral BID  . ferrous sulfate  325 mg Oral Daily  . finasteride  5 mg Oral QPM  . furosemide  20 mg Intravenous Once  . furosemide  40 mg Oral Daily  . hydrALAZINE  75 mg Oral TID  . insulin aspart  0-20 Units Subcutaneous TID WC  . insulin aspart  0-5 Units Subcutaneous QHS  . insulin aspart protamine- aspart  50 Units Subcutaneous BID  . lisinopril  40 mg Oral BID  . sodium chloride  3 mL Intravenous Q12H  . sodium chloride  3 mL Intravenous Q12H  . tamsulosin  0.4 mg Oral BID   Continuous Infusions: . sodium chloride irrigation      Principal Problem:   Gross hematuria Active Problems:   Severe obstructive sleep apnea    Chronic anticoagulation   History of pulmonary embolism   Acute-on-chronic kidney injury, baseline CKD stage 3   Anemia   Type II or unspecified type diabetes mellitus with unspecified complication, uncontrolled    Time spent: 25 minutes. Greater than 50% of this time was spent in direct contact with the patient coordinating care.    [name]  [facility] Pager [phone]  If 7PM-7AM, please contact night-coverage at [email or web address], password [facility] 04/06/2013, 2:45 PM  LOS: 3 days

## 2013-04-06 NOTE — H&P (Signed)
Urology Progress Note  Subjective:     No acute GU events. Required hand irrigation for clot several 2 times overnight. Patient transferred to [facility] due to lack of 3000 mL NS bags and needing closer nursing care.     ROS: Negative: SOB or chest pain.  Objective:  Patient Vitals for the past 24 hrs:  BP Temp Temp src Pulse Resp SpO2  04/06/13 1400 169/77 mmHg - - 59 20 96 %  04/06/13 1200 120/56 mmHg - - 57 19 96 %  04/06/13 1000 177/77 mmHg - - 60 16 100 %  04/06/13 0937 156/74 mmHg - - - - -  04/06/13 0935 156/74 mmHg - - 59 - -  04/06/13 0800 - 97.8 F (36.6 C) Oral - - -  04/06/13 0600 145/62 mmHg - - 56 16 94 %  04/06/13 0410 160/85 mmHg - - 59 15 95 %  04/06/13 0400 184/91 mmHg 98.1 F (36.7 C) Oral 64 13 97 %  04/06/13 0200 128/62 mmHg - - 58 17 94 %  04/06/13 0120 144/61 mmHg - - 60 21 93 %  04/06/13 0000 174/81 mmHg 98.3 F (36.8 C) Oral 71 16 96 %  04/05/13 2200 154/76 mmHg - - 63 20 97 %  04/05/13 2000 - 99.2 F (37.3 C) Oral - - -  04/05/13 1740 146/72 mmHg 98.2 F (36.8 C) Oral 63 20 97 %    Physical Exam: General:  No acute distress, awake Cardiovascular:    [x]   S1/S2 present, RRR  []   Irregularly irregular Chest:  CTA-B Abdomen:               []  Soft, appropriately TTP  [x]  Soft, NTTP  []  Soft, appropriately TTP, incision(s) clean/dry/intact  Genitourinary: Negative edema. Foley in place. Foley:  Draining clear urine on fast CBI drip.    I/O last 3 completed shifts: In: 44315 [P.O.:1560; Other:90700] Out: [id number] [QMGQQ:76195; Stool:1]  Recent Labs     04/05/13  0524  04/06/13  0326  HGB  7.6*  7.2*  WBC  5.4  4.9  PLT  297  274    Recent Labs     04/03/13  2245  04/04/13  0520  NA  132*  134*  K  4.4  4.2  CL  95*  98  CO2  21  22  BUN  20  19  CREATININE  1.89*  1.61*  CALCIUM  8.9  8.7  GFRNONAA  34*  41*  GFRAA  40*  48*     Recent Labs     04/05/13  0524  04/06/13  0326  INR  3.66*  3.19*     No components  found with this basename: ABG,     Length of stay: 3 days.  Assessment: Gross hematuria. Blood loss on chronic anemia.    Plan: I hand irrigated this afternoon with return of minimal small clot.  Will start intravesical carboprost to see if this can decrease bleeding. This has the side effect of HTN.  Will tentatively schedule him for cystoscopy, clot evacuation, and bladder fulguration in the OR for this coming Wednesday.  Will continue to follow.  Rolan Bucco, MD [phone]

## 2013-04-06 NOTE — Progress Notes (Signed)
Hypoglycemic Event  CBG: 68  Treatment: 15 GM carbohydrate snack  Symptoms: None  Follow-up CBG: Time:2253 CBG Result:95  Possible Reasons for Event: Unknown  Comments/MD notified:    Carilyn Goodpasture  Remember to initiate Hypoglycemia Order Set & complete

## 2013-04-06 NOTE — Progress Notes (Signed)
Inpatient Diabetes Program Recommendations  AACE/ADA: New Consensus Statement on Inpatient Glycemic Control (2013)  Target Ranges:  Prepandial:   less than 140 mg/dL      Peak postprandial:   less than 180 mg/dL (1-2 hours)      Critically ill patients:  140 - 180 mg/dL   Reason for Assessment: Mild Hypoglycemia, DM not included on Active Hospital Problem List  Diabetes history: Insulin-requiring diabetes Outpatient Diabetes medications: 70/30 insulin 70 units bid Current orders for Inpatient glycemic control: Novolog 70/30 50 units bid  Results for TOMA, ARTS (MRN [id number]) as of 04/06/2013 12:22  Ref. Range 04/05/2013 07:35 04/05/2013 11:50 04/05/2013 17:11 04/05/2013 19:31 04/05/2013 22:03 04/05/2013 22:53 04/06/2013 03:32 04/06/2013 07:47 04/06/2013 09:58  Glucose-Capillary Latest Range: 70-99 mg/dL 80 126 (H) 164 (H) 123 (H) 68 (L) 95 107 (H) 90 110 (H)   Note:  Hospital dose of 70/30 decreased from home dose of 70 units BID to 50 units BID on 04/05/13.  CBG last night at 2200 was 68 mg/dl.  Request MD consider changing pm dose of 70/30 to 45 units, and keep am dose at 50 units.    No diagnosis of diabetes is listed on the [facility] Problem List.  Request MD add an appropriate diagnosis of diabetes to the Hospital Problem List.  Thank you.  Brahm Barbeau S. Markie Heffernan, RN, CNS, CDE

## 2013-04-06 NOTE — Progress Notes (Signed)
Spoke with patient in regards to nighttime CPAP, patient has home unit machine at bedside--plugged into appropriate outlet, humidification chamber filled to line. prefers to self administer when ready. RT will assist as needed.

## 2013-04-07 LAB — GLUCOSE, CAPILLARY
GLUCOSE-CAPILLARY: 123 mg/dL — AB (ref 70–99)
GLUCOSE-CAPILLARY: 133 mg/dL — AB (ref 70–99)
Glucose-Capillary: 87 mg/dL (ref 70–99)
Glucose-Capillary: 122 mg/dL — ABNORMAL HIGH (ref 70–99)
Glucose-Capillary: 155 mg/dL — ABNORMAL HIGH (ref 70–99)

## 2013-04-07 LAB — PROTIME-INR
INR: 2.02 — AB (ref 0.00–1.49)
Prothrombin Time: 22.2 seconds — ABNORMAL HIGH (ref 11.6–15.2)

## 2013-04-07 LAB — HEMOGLOBIN AND HEMATOCRIT, BLOOD
HCT: 21.5 % — ABNORMAL LOW (ref 39.0–52.0)
Hemoglobin: 6.8 g/dL — CL (ref 13.0–17.0)

## 2013-04-07 LAB — CBC
HCT: 28.4 % — ABNORMAL LOW (ref 39.0–52.0)
HEMOGLOBIN: 9.2 g/dL — AB (ref 13.0–17.0)
MCH: 26.3 pg (ref 26.0–34.0)
MCHC: 32.4 g/dL (ref 30.0–36.0)
MCV: 81.1 fL (ref 78.0–100.0)
Platelets: 318 10*3/uL (ref 150–400)
RBC: 3.5 MIL/uL — AB (ref 4.22–5.81)
RDW: 17.6 % — ABNORMAL HIGH (ref 11.5–15.5)
WBC: 6 10*3/uL (ref 4.0–10.5)

## 2013-04-07 MED ORDER — WARFARIN - PHARMACIST DOSING INPATIENT: Freq: Every day | Status: DC

## 2013-04-07 MED ORDER — WARFARIN SODIUM 7.5 MG PO TABS
7.5000 mg | ORAL_TABLET | Freq: Once | ORAL | Status: AC
  Administered 2013-04-07: 7.5 mg via ORAL
  Filled 2013-04-07: qty 1

## 2013-04-07 NOTE — Progress Notes (Signed)
ANTICOAGULATION CONSULT NOTE - Follow up  Pharmacy Consult for warfarin Indication: Hx VTE  No Known Allergies  Patient Measurements: Height: 5\' 10"  (177.8 cm) Weight: 297 lb 2.9 oz (134.8 kg) IBW/kg (Calculated) : 73  Vital Signs: Temp: 98.1 F (36.7 C) (03/03 1259) Temp src: Oral (03/03 1259) BP: 120/60 mmHg (03/03 1259) Pulse Rate: 61 (03/03 1259)  Labs:  Recent Labs  04/04/13 1710 04/05/13 0524 04/06/13 0326 04/07/13 0310 04/07/13 0830  HGB 7.7* 7.6* 7.2*  --  6.8*  HCT 23.6* 23.8* 22.4*  --  21.5*  PLT 293 297 274  --   --   LABPROT  --  35.0* 31.5* 22.2*  --   INR  --  3.66* 3.19* 2.02*  --     Estimated Creatinine Clearance: 58.2 ml/min (by C-G formula based on Cr of 1.61).  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 Pharmacy has been consulted to continue warfarin while inpatient.  INR 3.10 on admission.    Home dose= 5mg  M/W/F and 7.5mg  TTSS, last dose 2/27   Patient on carboprost irrigation (started 3/2) and required hand irrigation for clots 2x overnight.  Plan for cystoscopy and clot evacuation with bladder fulguration tomorrow after another day of carboprost.  INR 2.02 today.  Warfarin has been on hold for past 3 days due to elevated INR.  After discussion with MD, will resume warfarin today and will aim for lower end of therapeutic INR range given hematuria.  MD wishes to continue warfarin given history of large saddle PE and residual DVT.  ABLA from hematuria.  Today Hgb 6.8 - 1 units PRBC transfused this AM, plts ok  Goal of Therapy:  INR 2-3 Monitor platelets by anticoagulation protocol: Yes   Plan:  1.  Warfarin 7.5 mg po once today. 2.  Daily PT/INR. 3.  F/u hematuria, CBC.  Hershal Coria, PharmD, BCPS Pager: [phone] 04/07/2013 1:27 PM

## 2013-04-07 NOTE — Progress Notes (Signed)
Hypoglycemic Event  CBG: 68  Treatment: 15 GM carbohydrate snack  Symptoms: None  Follow-up CBG: Time:2308 CBG Result:84  Possible Reasons for Event: Inadequate meal intake  Comments/MD notified:    Andrey Farmer  Remember to initiate Hypoglycemia Order Set & complete

## 2013-04-07 NOTE — Progress Notes (Signed)
Filled water reseviour to appropriate level for humidification. Patient prefers to self administer home CPAP when ready. RT will assist as needed.

## 2013-04-07 NOTE — H&P (Signed)
Urology Progress Note  Subjective:     No acute GU events. Required hand irrigation for clot just 2 times overnight. Carboprost intravesical irrigation started yesterday.    ROS: Negative: SOB or chest pain.  Objective:  Patient Vitals for the past 24 hrs:  BP Temp Temp src Pulse Resp SpO2 Weight  04/07/13 0402 150/61 mmHg - - 59 16 97 % -  04/07/13 0400 - 98.6 F (37 C) Axillary - - - 134.8 kg (297 lb 2.9 oz)  04/07/13 0000 149/64 mmHg 98.1 F (36.7 C) Oral 63 19 96 % -  04/06/13 2155 151/69 mmHg - - - - - -  04/06/13 2014 144/70 mmHg - - 70 20 99 % -  04/06/13 2000 - 98.5 F (36.9 C) Axillary - - - -  04/06/13 1800 156/73 mmHg - - 62 20 99 % -  04/06/13 1623 155/76 mmHg - - 63 - - -  04/06/13 1600 - 98.4 F (36.9 C) Oral - - - -  04/06/13 1400 169/77 mmHg - - 59 20 96 % -  04/06/13 1200 120/56 mmHg - - 57 19 96 % -  04/06/13 1000 177/77 mmHg - - 60 16 100 % -  04/06/13 0937 156/74 mmHg - - - - - -  04/06/13 0935 156/74 mmHg - - 59 - - -    Physical Exam: General:  No acute distress, awake Cardiovascular:    [x]   S1/S2 present, RRR  []   Irregularly irregular Chest:  CTA-B Abdomen:               []  Soft, appropriately TTP  [x]  Soft, NTTP  []  Soft, appropriately TTP, incision(s) clean/dry/intact  Genitourinary: Negative edema. Foley in place. Foley:  Draining pink urine on CBI.    I/O last 3 completed shifts: In: 58099 [P.O.:2040; I.V.:3; IPJAS:50539] Out: 76734 [LPFXT:02409; Stool:1]  Recent Labs     04/05/13  0524  04/06/13  0326  HGB  7.6*  7.2*  WBC  5.4  4.9  PLT  297  274    No results found for this basename: NA, K, CL, CO2, BUN, CREATININE, CALCIUM, MAGNESIUM, GFRNONAA, GFRAA,  in the last 72 hours   Recent Labs     04/06/13  0326  04/07/13  0310  INR  3.19*  2.02*     No components found with this basename: ABG,     Length of stay: 4 days.  Assessment: Gross hematuria. Blood loss on chronic anemia.    Plan: Discussed going to OR  for cystoscopy and clot evacuation with bladder fulguration. Discussed risk/benefits/alternatives/ likelihood of achieving goals. He would like to give carboprost another 24 hours, which is reasonable.  Continue intravesical carboprost.  On OR schedule for tomorrow around 3pm. Please make NPO at midnight and adjust IV fluids accordingly.  Will continue to follow.  Rolan Bucco, MD [phone]

## 2013-04-07 NOTE — Progress Notes (Signed)
Pt experiencing pain. Pt was hand irrigated through foley. Hand irrigated with 200cc of saline. Multiple small to moderate blood clots were removed.

## 2013-04-07 NOTE — Progress Notes (Signed)
CARE MANAGEMENT NOTE 04/07/2013  Patient:  [name], [name]   Account Number:  [id number]  Date Initiated:  04/07/2013  Documentation initiated by:  DAVIS,RHONDA  Subjective/Objective Assessment:   pt with hx of tumor lysis bu r.t. , heavy bleeding from bladder, passing large obstructing clot that rrequire frequent manuel extraction.     Action/Plan:   plan is for O.R. on [id number]/home when stable   Anticipated DC Date:  04/10/2013   Anticipated DC Plan:  HOME/SELF CARE  In-house referral  NA      DC Planning Services  NA      [facility] Choice  NA   Choice offered to / List presented to:  NA   DME arranged  NA      DME agency  NA     [place] arranged  NA      [place] agency  NA   Status of service:  In process, will continue to follow Medicare Important Message given?  NA - LOS <3 / Initial given by admissions (If response is "NO", the following Medicare IM given date fields will be blank) Date Medicare IM given:   Date Additional Medicare IM given:    Discharge Disposition:    Per UR Regulation:  Reviewed for med. necessity/level of care/duration of stay  If discussed at [place] of Stay Meetings, dates discussed:    Comments:  [id number]/Rhonda Eldridge Dace, BSN, [place] [phone] Chart Reviewed for discharge and hospital needs. Discharge needs at time of review:  None present will follow for needs. Review of patient progress due on [id number].

## 2013-04-07 NOTE — Progress Notes (Signed)
Hypoglycemic Event  CBG: 60   Treatment: 15 GM carbohydrate snack  Symptoms: None  Follow-up CBG: Time:2214 CBG Result:57   Possible Reasons for Event: Inadequate meal intake and Medication regimen: novolog mix 70/30 given when cbg<120 (when pt stated he would normally hold it)  Comments/MD notified:given another snack;see next progress notes    [name]  Remember to initiate Hypoglycemia Order Set & complete

## 2013-04-07 NOTE — Progress Notes (Signed)
Hypoglycemic Event  CBG: 57   Treatment: 15 GM carbohydrate snack  Symptoms: None  Follow-up CBG: Time:2236 CBG Result:68  Possible Reasons for Event: Inadequate meal intake  Comments/MD notified:     Andrey Farmer  Remember to initiate Hypoglycemia Order Set & complete

## 2013-04-07 NOTE — Progress Notes (Signed)
[facility] PROGRESS NOTE  [name] BWG:[id number] DOB: [birth year] DOA: 04/03/2013 PCP: Melinda Crutch  Assessment/Plan: Gross Hematuria -2/2 radiation-induced cystitis. -Appreciate GU assistance. -Currently on CBI and intravesical carbaprost. -For cystoscopy in am.  ABLA -Will transfuse 2 u PRBCs today for a Hb of 6.8.  PE -Had a large saddle PE and residual DVT. -Makes taking him off coumadin difficult. -Feel like we cannot stop anticoagulation at this point. -Continue warfarin. Aim for lower end of therapeutic range.  DM -Well controlled. -Keep current dose of insulin 70/30 t45 BID.  Code Status: Full Code Family Communication: Discussed with wife at bedside.  Disposition Plan: Home when ready.    Consultants:  GU   Antibiotics:  none   Subjective: None  Objective: Filed Vitals:   04/07/13 1259 04/07/13 1412 04/07/13 1501 04/07/13 1626  BP: 120/60 121/46 140/66 121/52  Pulse: 61 59 66   Temp: 98.1 F (36.7 C) 98.2 F (36.8 C) 98.2 F (36.8 C) 98.1 F (36.7 C)  TempSrc: Oral Oral Oral Oral  Resp: 13 19 16    Height:      Weight:      SpO2: 99% 98% 99%     Intake/Output Summary (Last 24 hours) at 04/07/13 1639 Last data filed at 04/07/13 1626  Gross per 24 hour  Intake 27860.5 ml  Output  28680 ml  Net -819.5 ml   Filed Weights   04/04/13 0428 04/07/13 0400  Weight: 135.1 kg (297 lb 13.5 oz) 134.8 kg (297 lb 2.9 oz)    Exam:   General:  AA Ox3  Cardiovascular: RRR  Respiratory: CTA B  Abdomen: S/NT/ND/+BS  Extremities: no C/C/E   Neurologic:  Non-focal  Data Reviewed: Basic Metabolic Panel:  Recent Labs Lab 04/03/13 2245 04/04/13 0520  NA 132* 134*  K 4.4 4.2  CL 95* 98  CO2 21 22  GLUCOSE 173* 141*  BUN 20 19  CREATININE 1.89* 1.61*  CALCIUM 8.9 8.7  MG  --  2.2  PHOS  --  3.6   Liver Function Tests:  Recent Labs Lab 04/04/13 0520  AST 24  ALT 18  ALKPHOS 26*  BILITOT 0.3  PROT 5.4*  ALBUMIN 2.7*    No results found for this basename: LIPASE, AMYLASE,  in the last 168 hours No results found for this basename: AMMONIA,  in the last 168 hours CBC:  Recent Labs Lab 04/03/13 2245 04/04/13 0520 04/04/13 1710 04/05/13 0524 04/06/13 0326 04/07/13 0830  WBC 8.0 6.8 5.9 5.4 4.9  --   NEUTROABS 6.8  --   --   --   --   --   HGB 6.8* 6.2* 7.7* 7.6* 7.2* 6.8*  HCT 22.2* 19.8* 23.6* 23.8* 22.4* 21.5*  MCV 77.9* 78.3 78.9 79.6 80.0  --   PLT 338 317 293 297 274  --    Cardiac Enzymes: No results found for this basename: CKTOTAL, CKMB, CKMBINDEX, TROPONINI,  in the last 168 hours BNP (last 3 results) No results found for this basename: PROBNP,  in the last 8760 hours CBG:  Recent Labs Lab 04/06/13 2214 04/06/13 2236 04/06/13 2308 04/07/13 0758 04/07/13 1148  GLUCAP 57* 68* 84 87 122*    Recent Results (from the past 240 hour(s))  MRSA PCR SCREENING     Status: None   Collection Time    04/05/13  6:01 PM      Result Value Ref Range Status   MRSA by PCR NEGATIVE  NEGATIVE Final   Comment:  The GeneXpert MRSA Assay (FDA     approved for NASAL specimens     only), is one component of a     comprehensive MRSA colonization     surveillance program. It is not     intended to diagnose MRSA     infection nor to guide or     monitor treatment for     MRSA infections.     Studies: No results found.  Scheduled Meds: . sodium chloride  500 mL Intravenous Once  . acetaminophen  650 mg Oral Once  . aspirin  81 mg Oral QHS  . carboprost (HEMABATE) bladder irrigation - continuous   Bladder Irrigation Q24H   And  . sodium chloride irrigation  3,000 mL Bladder Irrigation Q24H  . carvedilol  3.125 mg Oral BID WC  . docusate sodium  100 mg Oral BID  . ferrous sulfate  325 mg Oral Daily  . finasteride  5 mg Oral QPM  . furosemide  20 mg Intravenous Once  . furosemide  40 mg Oral Daily  . hydrALAZINE  75 mg Oral TID  . insulin aspart  0-20 Units Subcutaneous TID WC   . insulin aspart  0-5 Units Subcutaneous QHS  . insulin aspart protamine- aspart  45 Units Subcutaneous BID  . lisinopril  40 mg Oral BID  . sodium chloride  3 mL Intravenous Q12H  . sodium chloride  3 mL Intravenous Q12H  . sodium chloride irrigation  3,000 mL Bladder Irrigation Q24H  . tamsulosin  0.4 mg Oral BID  . warfarin  7.5 mg Oral ONCE-1800  . Warfarin - Pharmacist Dosing Inpatient   Does not apply q1800   Continuous Infusions:    Principal Problem:   Gross hematuria Active Problems:   Severe obstructive sleep apnea   Chronic anticoagulation   History of pulmonary embolism   Acute-on-chronic kidney injury, baseline CKD stage 3   Anemia   Type II or unspecified type diabetes mellitus with unspecified complication, uncontrolled    Time spent: 25 minutes. Greater than 50% of this time was spent in direct contact with the patient coordinating care.    [name]  [facility] Pager [phone]  If 7PM-7AM, please contact night-coverage at [email or web address], password [facility] 04/07/2013, 4:39 PM  LOS: 4 days

## 2013-04-08 ENCOUNTER — Inpatient Hospital Stay (HOSPITAL_COMMUNITY): Payer: BC Managed Care – PPO | Admitting: *Deleted

## 2013-04-08 ENCOUNTER — Encounter (HOSPITAL_COMMUNITY)
Admission: EM | Disposition: A | Discharge status: Home / Self Care | Payer: Self-pay | Source: Home / Self Care | Attending: Internal Medicine

## 2013-04-08 ENCOUNTER — Encounter (HOSPITAL_COMMUNITY): Payer: BC Managed Care – PPO | Admitting: *Deleted

## 2013-04-08 ENCOUNTER — Other Ambulatory Visit: Payer: Self-pay

## 2013-04-08 ENCOUNTER — Encounter (HOSPITAL_COMMUNITY): Payer: Self-pay | Admitting: *Deleted

## 2013-04-08 HISTORY — PX: CYSTOSCOPY W/ RETROGRADES: SHX1426

## 2013-04-08 LAB — TYPE AND SCREEN
ABO/RH(D): A POS
Antibody Screen: NEGATIVE
UNIT DIVISION: 0
UNIT DIVISION: 0
Unit division: 0
Unit division: 0

## 2013-04-08 LAB — GLUCOSE, CAPILLARY
GLUCOSE-CAPILLARY: 144 mg/dL — AB (ref 70–99)
GLUCOSE-CAPILLARY: 117 mg/dL — AB (ref 70–99)
Glucose-Capillary: 129 mg/dL — ABNORMAL HIGH (ref 70–99)
Glucose-Capillary: 143 mg/dL — ABNORMAL HIGH (ref 70–99)
Glucose-Capillary: 116 mg/dL — ABNORMAL HIGH (ref 70–99)

## 2013-04-08 LAB — CBC
HCT: 27.6 % — ABNORMAL LOW (ref 39.0–52.0)
HCT: 29.4 % — ABNORMAL LOW (ref 39.0–52.0)
HEMOGLOBIN: 9.3 g/dL — AB (ref 13.0–17.0)
Hemoglobin: 8.7 g/dL — ABNORMAL LOW (ref 13.0–17.0)
MCH: 25.7 pg — ABNORMAL LOW (ref 26.0–34.0)
MCH: 25.9 pg — ABNORMAL LOW (ref 26.0–34.0)
MCHC: 31.5 g/dL (ref 30.0–36.0)
MCHC: 31.6 g/dL (ref 30.0–36.0)
MCV: 81.7 fL (ref 78.0–100.0)
MCV: 81.9 fL (ref 78.0–100.0)
PLATELETS: 314 10*3/uL (ref 150–400)
Platelets: 294 10*3/uL (ref 150–400)
RBC: 3.38 MIL/uL — ABNORMAL LOW (ref 4.22–5.81)
RBC: 3.59 MIL/uL — ABNORMAL LOW (ref 4.22–5.81)
RDW: 17.8 % — ABNORMAL HIGH (ref 11.5–15.5)
RDW: 17.8 % — ABNORMAL HIGH (ref 11.5–15.5)
WBC: 6.2 10*3/uL (ref 4.0–10.5)
WBC: 7.5 10*3/uL (ref 4.0–10.5)

## 2013-04-08 LAB — PROTIME-INR
INR: 1.63 — ABNORMAL HIGH (ref 0.00–1.49)
Prothrombin Time: 18.9 seconds — ABNORMAL HIGH (ref 11.6–15.2)

## 2013-04-08 SURGERY — CYSTOSCOPY, WITH RETROGRADE PYELOGRAM
Anesthesia: General | Site: Bladder

## 2013-04-08 SURGERY — Surgical Case
Anesthesia: *Unknown

## 2013-04-08 MED ORDER — CARVEDILOL 3.125 MG PO TABS
3.1250 mg | ORAL_TABLET | Freq: Two times a day (BID) | ORAL | Status: DC
Start: 2013-04-08 — End: 2013-04-09

## 2013-04-08 MED ORDER — LIDOCAINE HCL (CARDIAC) 20 MG/ML IV SOLN
INTRAVENOUS | Status: AC
  Filled 2013-04-08: qty 5

## 2013-04-08 MED ORDER — HYDROMORPHONE HCL PF 1 MG/ML IJ SOLN
0.2500 mg | INTRAMUSCULAR | Status: DC | PRN
  Administered 2013-04-08 (×2): 0.5 mg via INTRAVENOUS

## 2013-04-08 MED ORDER — SODIUM CHLORIDE 0.9 % IR SOLN
Status: DC | PRN
  Administered 2013-04-08: 3000 mL via INTRAVESICAL
  Administered 2013-04-08: 6000 mL via INTRAVESICAL
  Administered 2013-04-08: 3000 mL via INTRAVESICAL
  Administered 2013-04-08: 3000 mL
  Administered 2013-04-08: 3000 mL via INTRAVESICAL

## 2013-04-08 MED ORDER — LIDOCAINE HCL 2 % EX GEL
CUTANEOUS | Status: DC | PRN
  Administered 2013-04-08: 1 via URETHRAL

## 2013-04-08 MED ORDER — PROPOFOL 10 MG/ML IV BOLUS
INTRAVENOUS | Status: AC
  Filled 2013-04-08: qty 20

## 2013-04-08 MED ORDER — FENTANYL CITRATE 0.05 MG/ML IJ SOLN
INTRAMUSCULAR | Status: DC | PRN
  Administered 2013-04-08: 50 ug via INTRAVENOUS
  Administered 2013-04-08: 75 ug via INTRAVENOUS
  Administered 2013-04-08: 25 ug via INTRAVENOUS
  Administered 2013-04-08: 75 ug via INTRAVENOUS
  Administered 2013-04-08: 25 ug via INTRAVENOUS
  Administered 2013-04-08: 100 ug via INTRAVENOUS
  Administered 2013-04-08 (×2): 25 ug via INTRAVENOUS

## 2013-04-08 MED ORDER — FENTANYL CITRATE 0.05 MG/ML IJ SOLN
25.0000 ug | INTRAMUSCULAR | Status: DC | PRN
  Administered 2013-04-08 (×3): 50 ug via INTRAVENOUS

## 2013-04-08 MED ORDER — SODIUM CHLORIDE 0.9 % IR SOLN: 3000.0000 mL | Status: DC

## 2013-04-08 MED ORDER — WARFARIN SODIUM 7.5 MG PO TABS
7.5000 mg | ORAL_TABLET | Freq: Once | ORAL | Status: AC
  Administered 2013-04-08: 7.5 mg via ORAL
  Filled 2013-04-08: qty 1

## 2013-04-08 MED ORDER — HYDROMORPHONE HCL PF 1 MG/ML IJ SOLN
INTRAMUSCULAR | Status: AC
  Filled 2013-04-08: qty 1

## 2013-04-08 MED ORDER — LACTATED RINGERS IV SOLN
INTRAVENOUS | Status: DC | PRN
  Administered 2013-04-08: 13:00:00 via INTRAVENOUS

## 2013-04-08 MED ORDER — LIDOCAINE HCL 1 % IJ SOLN
INTRAMUSCULAR | Status: DC | PRN
  Administered 2013-04-08: 50 mg via INTRADERMAL

## 2013-04-08 MED ORDER — SODIUM CHLORIDE 0.9 % IV SOLN
250.0000 mL | INTRAVENOUS | Status: AC
  Administered 2013-04-08: 250 mL via INTRAVENOUS
  Administered 2013-04-08: 1000 mL via INTRAVENOUS

## 2013-04-08 MED ORDER — LACTATED RINGERS IV SOLN: INTRAVENOUS | Status: DC

## 2013-04-08 MED ORDER — PROPOFOL 10 MG/ML IV BOLUS
INTRAVENOUS | Status: DC | PRN
  Administered 2013-04-08: 10 mg via INTRAVENOUS
  Administered 2013-04-08: 200 mg via INTRAVENOUS
  Administered 2013-04-08: 10 mg via INTRAVENOUS
  Administered 2013-04-08 (×2): 20 mg via INTRAVENOUS

## 2013-04-08 MED ORDER — FENTANYL CITRATE 0.05 MG/ML IJ SOLN
INTRAMUSCULAR | Status: AC
  Filled 2013-04-08: qty 2

## 2013-04-08 MED ORDER — BELLADONNA ALKALOIDS-OPIUM 16.2-60 MG RE SUPP
RECTAL | Status: AC
  Filled 2013-04-08: qty 1

## 2013-04-08 MED ORDER — DEXTROSE 5 % IV SOLN
3.0000 g | Freq: Once | INTRAVENOUS | Status: DC
  Filled 2013-04-08: qty 3000

## 2013-04-08 MED ORDER — ONDANSETRON HCL 4 MG/2ML IJ SOLN
INTRAMUSCULAR | Status: AC
  Filled 2013-04-08: qty 2

## 2013-04-08 MED ORDER — ONDANSETRON HCL 4 MG/2ML IJ SOLN
INTRAMUSCULAR | Status: DC | PRN
  Administered 2013-04-08: 4 mg via INTRAVENOUS

## 2013-04-08 MED ORDER — FERROUS SULFATE 325 (65 FE) MG PO TABS
325.0000 mg | ORAL_TABLET | Freq: Three times a day (TID) | ORAL | Status: DC
  Administered 2013-04-08 – 2013-04-09 (×2): 325 mg via ORAL
  Filled 2013-04-08 (×6): qty 1

## 2013-04-08 MED ORDER — SODIUM CHLORIDE 0.9 % IR SOLN: Status: DC

## 2013-04-08 MED ORDER — LIDOCAINE HCL 2 % EX GEL
CUTANEOUS | Status: AC
  Filled 2013-04-08: qty 10

## 2013-04-08 MED ORDER — BELLADONNA ALKALOIDS-OPIUM 16.2-60 MG RE SUPP
RECTAL | Status: DC | PRN
  Administered 2013-04-08: 1 via RECTAL

## 2013-04-08 MED ORDER — CEFAZOLIN SODIUM-DEXTROSE 2-3 GM-% IV SOLR
INTRAVENOUS | Status: DC | PRN
  Administered 2013-04-08: 2 g via INTRAVENOUS

## 2013-04-08 MED ORDER — 0.9 % SODIUM CHLORIDE (POUR BTL) OPTIME
TOPICAL | Status: DC | PRN
  Administered 2013-04-08: 1000 mL

## 2013-04-08 MED ORDER — MIDAZOLAM HCL 2 MG/2ML IJ SOLN
INTRAMUSCULAR | Status: AC
  Filled 2013-04-08: qty 2

## 2013-04-08 MED ORDER — MIDAZOLAM HCL 5 MG/5ML IJ SOLN
INTRAMUSCULAR | Status: DC | PRN
  Administered 2013-04-08: 2 mg via INTRAVENOUS

## 2013-04-08 SURGICAL SUPPLY — 21 items
BAG URINE DRAINAGE (UROLOGICAL SUPPLIES) ×2 IMPLANT
BAG URO CATCHER STRL LF (DRAPE) ×3 IMPLANT
BASKET ZERO TIP NITINOL 2.4FR (BASKET) IMPLANT
BSKT STON RTRVL ZERO TP 2.4FR (BASKET)
CATH HEMA 3WAY 30CC 24FR COUDE (CATHETERS) ×2 IMPLANT
CATH INTERMIT  6FR 70CM (CATHETERS) IMPLANT
CATH URET 5FR 28IN OPEN ENDED (CATHETERS) ×2 IMPLANT
DRAPE CAMERA CLOSED 9X96 (DRAPES) ×1 IMPLANT
ELECT BUTTON HF 24-28F 2 30DE (ELECTRODE) ×2 IMPLANT
GLOVE BIOGEL M 7.0 STRL (GLOVE) ×3 IMPLANT
GOWN STRL REUS W/TWL LRG LVL3 (GOWN DISPOSABLE) ×3 IMPLANT
GUIDEWIRE ANG ZIPWIRE 038X150 (WIRE) IMPLANT
GUIDEWIRE STR DUAL SENSOR (WIRE) ×3 IMPLANT
KIT ASPIRATION TUBING (SET/KITS/TRAYS/PACK) ×2 IMPLANT
LOOP ELECTRODE MEDIUM (ELECTRODE) ×2 IMPLANT
MANIFOLD NEPTUNE II (INSTRUMENTS) ×3 IMPLANT
PACK CYSTO (CUSTOM PROCEDURE TRAY) ×3 IMPLANT
SCRUB PCMX 4 OZ (MISCELLANEOUS) ×3 IMPLANT
SYRINGE IRR TOOMEY STRL 70CC (SYRINGE) ×2 IMPLANT
TUBING CONNECTING 10 (TUBING) ×2 IMPLANT
TUBING CONNECTING 10' (TUBING) ×1

## 2013-04-08 NOTE — Telephone Encounter (Signed)
Rx was sent to pharmacy electronically.  Called patient to clarify medications - Carvedilol 3.125mg  BID was received from pharmacy. As of Dr Evette Georges last office visit note (from 01/15/2013) patient was taking 18.75mg  Carvedilol BID.  Per patient, Carvedilol was stopped, then cut back while in the hospital. Patient takes 3.125mg  Carvedilol BID.

## 2013-04-08 NOTE — H&P (Signed)
Urology Progress Note  Subjective:     No acute GU events. Required hand irrigation for clot just 1 time overnight. Carboprost intravesical irrigation continues on a 10 hour interval. His urine remains red when continuous bladder irrigation was turned down. His hemoglobin has dropped enough to require another blood transfusion.  I discussed with the patient that I feel that a process not working adequately. Other intravesical options such as aluminum are not available due to national shortage. Because of this I recommend that we proceed as planned with cystoscopy and clot evacuation today.   ROS: Negative: SOB or chest pain.  Objective:  Patient Vitals for the past 24 hrs:  BP Temp Temp src Pulse Resp SpO2  04/08/13 0800 155/65 mmHg - - 60 17 96 %  04/08/13 0700 - - - 59 18 96 %  04/08/13 0600 - - - 55 15 95 %  04/08/13 0540 125/56 mmHg - - 52 15 95 %  04/08/13 0500 - - - 57 15 93 %  04/08/13 0430 - 98.3 F (36.8 C) Axillary - - -  04/08/13 0420 - - - 59 21 91 %  04/08/13 0400 171/83 mmHg - - 60 16 96 %  04/08/13 0300 - - - 60 16 95 %  04/08/13 0200 - - - 64 17 95 %  04/08/13 0100 - - - 62 20 94 %  04/08/13 0034 - 98.7 F (37.1 C) Oral - 16 -  04/08/13 0000 149/54 mmHg - - 68 21 96 %  04/07/13 2300 - - - 62 19 96 %  04/07/13 2221 140/63 mmHg - - - - -  04/07/13 2200 - - - 60 17 96 %  04/07/13 2100 - - - 61 17 96 %  04/07/13 2000 140/63 mmHg 98.7 F (37.1 C) Oral 60 15 97 %  04/07/13 1902 139/65 mmHg 98.5 F (36.9 C) Oral 62 16 98 %  04/07/13 1900 139/65 mmHg 98.5 F (36.9 C) Oral 63 17 98 %  04/07/13 1739 156/82 mmHg 98.1 F (36.7 C) Oral 64 20 98 %  04/07/13 1659 - - - 63 - -  04/07/13 1641 136/63 mmHg 98.8 F (37.1 C) Oral 65 20 97 %  04/07/13 1630 - - - 64 20 98 %  04/07/13 1626 121/52 mmHg 98.1 F (36.7 C) Oral - - -  04/07/13 1501 140/66 mmHg 98.2 F (36.8 C) Oral 66 16 99 %  04/07/13 1412 121/46 mmHg 98.2 F (36.8 C) Oral 59 19 98 %  04/07/13 1259 120/60 mmHg  98.1 F (36.7 C) Oral 61 13 99 %  04/07/13 1229 121/67 mmHg 98.5 F (36.9 C) Oral 78 20 100 %  04/07/13 0903 144/62 mmHg - - 59 - -    Physical Exam: General:  No acute distress, awake Cardiovascular:    [x]   S1/S2 present, RRR  []   Irregularly irregular Chest:  CTA-B Abdomen:               []  Soft, appropriately TTP  [x]  Soft, NTTP  []  Soft, appropriately TTP, incision(s) clean/dry/intact  Genitourinary: Negative edema. Foley in place. Foley:  Draining pink urine on CBI.    I/O last 3 completed shifts: In: [address] [P.O.:1560; I.V.:248.5; Blood:707.5; Other:36210] Out: [place] [Urine:41175]  Recent Labs     04/07/13  2119  04/08/13  0347  HGB  9.2*  8.7*  WBC  6.0  6.2  PLT  318  314    No results found for this basename: NA,  K, CL, CO2, BUN, CREATININE, CALCIUM, MAGNESIUM, GFRNONAA, GFRAA,  in the last 72 hours   Recent Labs     04/07/13  0310  04/08/13  0347  INR  2.02*  1.63*     No components found with this basename: ABG,     Length of stay: 5 days.  Assessment: Gross hematuria. Blood loss on chronic anemia.    Plan: Transfuse as recommended by common team.  Plan to proceed to the operating room for cystoscopy and clot evacuation with bladder fulguration today.  Will continue to follow.  Rolan Bucco, MD [phone]

## 2013-04-08 NOTE — Progress Notes (Signed)
ANTICOAGULATION CONSULT NOTE - Follow up  Pharmacy Consult for warfarin Indication: Hx VTE  No Known Allergies  Patient Measurements: Height: 5\' 10"  (177.8 cm) Weight: 297 lb 2.9 oz (134.8 kg) IBW/kg (Calculated) : 73  Vital Signs: Temp: 97.9 F (36.6 C) (03/04 0800) Temp src: Oral (03/04 0800) BP: 155/65 mmHg (03/04 0800) Pulse Rate: 60 (03/04 0800)  Labs:  Recent Labs  04/06/13 0326 04/07/13 0310 04/07/13 0830 04/07/13 2119 04/08/13 0347  HGB 7.2*  --  6.8* 9.2* 8.7*  HCT 22.4*  --  21.5* 28.4* 27.6*  PLT 274  --   --  318 314  LABPROT 31.5* 22.2*  --   --  18.9*  INR 3.19* 2.02*  --   --  1.63*    Estimated Creatinine Clearance: 58.2 ml/min (by C-G formula based on Cr of 1.61).  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 Pharmacy has been consulted to continue warfarin while inpatient.  INR 3.10 on admission.    Home dose= 5mg  M/W/F and 7.5mg  TTSS, last dose 2/27   Patient on carboprost irrigation (started 3/2) and required hand irrigation for clots 1x overnight.  Urine remains red.  Plan for cystoscopy and clot evacuation with bladder fulguration today.  INR 1.63 today.  Anticipated INR drop due to 3 days of holding warfarin due to INR >3.  After discussion with MD, warfarin was resumed 3/3 when INR<3 with goal to aim for lower end of therapeutic INR range given hematuria.  MD wishes to continue warfarin given history of large saddle PE and residual DVT.  ABLA from hematuria.  Today Hgb 8.7 - improved after 2 units PRBC yesterday.  Plts WNL.  Patient also continued on aspirin 81 mg daily from PTA.  Goal of Therapy:  INR 2-3 Monitor platelets by anticoagulation protocol: Yes   Plan:  1.  Warfarin 7.5 mg po once today. 2.  Daily PT/INR. 3.  F/u hematuria, CBC.  Hershal Coria, PharmD, BCPS Pager: [phone] 04/08/2013 10:35  AM

## 2013-04-08 NOTE — Op Note (Signed)
Urology Operative Report  Date of Procedure: 04/08/13  Surgeon: Rolan Bucco, MD Assistant:  None  Preoperative Diagnosis: Radiation cystitis. Gross hematuria. Postoperative Diagnosis:  Same  Procedure(s): Cystoscopy. Clot evacuation. Bladder fulguration.  Estimated blood loss: Minimal  Specimen: None  Drains: 24Fr 3-way hematuria catheter (15cc sterile water in balloon)  Complications: None  Findings: Hemorrhagic bladder mucosa.  History of present illness: 73 year old male was admitted to the hospital over the weekend with recurrent gross hematuria. He has a history of radiation cystitis. He is undergoing outpatient treatment with hyperbaric oxygen. Attempted hand irrigation and continuous bladder irrigation failed to resolve his gross hematuria. He report blood transfusion he was on the hospitalist service. Intravesical carboprost was also attempted which did not clear his gross hematuria. Because of this I recommended proceed to the operating room for cystoscopy, clot evacuation, and bladder fulguration.   Procedure in detail: After informed consent was obtained, the patient was taken to the operating room. They were placed in the supine position. SCDs were turned on and in place. IV antibiotics were infused, and general anesthesia was induced. A timeout was performed in which the correct patient, surgical site, and procedure were identified and agreed upon by the team.  The patient was placed in a dorsolithotomy position, making sure to pad all pertinent neurovascular pressure points. The genitals were prepped and draped in the usual sterile fashion.  The visual obturator to the long instrument set of the gyrus resectoscope was passed through the urethra and into the bladder. Once in the bladder there was noted to be a large clot. This required resection in normal saline with the loop device of the gyrus resectoscope. After this was done clot was removed the bladder was  visualized. Right ureter orifice was identified. Cautery marks were made around the ureter to avoid cauterizing over the ureter in the future. The mucosa was friable and hemorrhagic especially on the bladder base and around the bladder neck. A rollerball device was used to cauterize the mucosa with a long gyroscope. I also used a short gyroscope with the plasma button to perform cautery around the bladder neck.  Once this had been done adequately urination was turned off and there was good hemostasis. The cystoscope was removed.  I placed 10 cc of lidocaine jelly into the urethra. A belladonna and opium suppository was placed and the rectum. She's placed back in a supine position, anesthesia was reversed, and he was taken to the PACU in stable condition. He'll be transferred back to the step down unit for continuous bladder irrigation.  All counts were correct at the end of the case.

## 2013-04-08 NOTE — Transfer of Care (Signed)
Immediate Anesthesia Transfer of Care Note  Patient: [name]  Procedure(s) Performed: Procedure(s): CYSTOSCOPY WITH CLOT EVACUATION (N/A)  Patient Location: PACU  Anesthesia Type:General  Level of Consciousness: awake, alert , oriented and patient cooperative  Airway & Oxygen Therapy: Patient Spontanous Breathing and Patient connected to face mask oxygen  Post-op Assessment: Report given to PACU RN and Post -op Vital signs reviewed and stable  Post vital signs: Reviewed and stable  Complications: No apparent anesthesia complications

## 2013-04-08 NOTE — Anesthesia Preprocedure Evaluation (Signed)
Anesthesia Evaluation  Patient identified by MRN, date of birth, ID band Patient awake    Reviewed: Allergy & Precautions, H&P , NPO status , Patient's Chart, lab work & pertinent test results, reviewed documented beta blocker date and time   Airway Mallampati: II TM Distance: >3 FB Neck ROM: full    Dental  (+) Missing, Dental Advisory Given Missing upper right lateral incissors:   Pulmonary neg pulmonary ROS, sleep apnea and Continuous Positive Airway Pressure Ventilation ,  History PE breath sounds clear to auscultation  Pulmonary exam normal       Cardiovascular hypertension, Pt. on home beta blockers and Pt. on medications Rhythm:regular Rate:Normal  EF 55%.  Low risk stress test 2011.  bradycardia   Neuro/Psych negative neurological ROS  negative psych ROS   GI/Hepatic negative GI ROS, Neg liver ROS,   Endo/Other  diabetes, Well Controlled, Type 2, Insulin DependentMorbid obesity  Renal/GU Renal diseasenegative Renal ROSStage 3 chronic kidney disease  negative genitourinary   Musculoskeletal   Abdominal (+) + obese,   Peds  Hematology negative hematology ROS (+)   Anesthesia Other Findings   Reproductive/Obstetrics negative OB ROS                           Anesthesia Physical Anesthesia Plan  ASA: III  Anesthesia Plan: General   Post-op Pain Management:    Induction: Intravenous  Airway Management Planned: LMA  Additional Equipment:   Intra-op Plan:   Post-operative Plan:   Informed Consent: I have reviewed the patients History and Physical, chart, labs and discussed the procedure including the risks, benefits and alternatives for the proposed anesthesia with the patient or authorized representative who has indicated his/her understanding and acceptance.   Dental Advisory Given  Plan Discussed with: CRNA and Surgeon  Anesthesia Plan Comments:         Anesthesia  Quick Evaluation

## 2013-04-08 NOTE — Progress Notes (Signed)
[facility] PROGRESS NOTE  [name] DXI:[id number] DOB: [birth year] DOA: 04/03/2013 PCP: Melinda Crutch  Assessment/Plan: 1. Acute blood loss anemia - 2ary to radiation-induced cystitis - monitor cbc daily - Add ferrous sulfate supplementation - transfuse with hgb of less than 7.9 given active bleeding.  2. Gross hematuria - 2ary to radiation induced cystitis - Urology on board - Currently on CBI and intravesical carbaprost - Cystoscopy today per prior progress note  3. PE - Had a large saddle PE and residual DVT.  -Makes taking him off coumadin difficult.  -Feel like we cannot stop anticoagulation at this point.  -Continue warfarin cautiously. Will replete hgb as needed.  4. DM - SSI - Once diet is able to be resumed place order for diabetic diet. - Blood sugars relatively well controlled.   Code Status: full Family Communication: No family at bedside Disposition Plan: Pending resolution of active bleed and therapeutic INR   Consultants:  Urology  Procedures:  None  Antibiotics:  None  HPI/Subjective: No new complaints. Patient denies any SOB, chest pain, fever, or chills.  Objective: Filed Vitals:   04/08/13 0800  BP: 155/65  Pulse: 60  Temp:   Resp: 17    Intake/Output Summary (Last 24 hours) at 04/08/13 0816 Last data filed at 04/08/13 0750  Gross per 24 hour  Intake  37563 ml  Output  38125 ml  Net   -562 ml   Filed Weights   04/04/13 0428 04/07/13 0400  Weight: 135.1 kg (297 lb 13.5 oz) 134.8 kg (297 lb 2.9 oz)    Exam:   General:  Pt in NAD, Alert and awake  Cardiovascular: RRR, no MRG  Respiratory: CTA BL, no wheezes, no increased WOB on room air  Abdomen: soft, obese, NT  Musculoskeletal: no cyanosis or clubbing   Data Reviewed: Basic Metabolic Panel:  Recent Labs Lab 04/03/13 2245 04/04/13 0520  NA 132* 134*  K 4.4 4.2  CL 95* 98  CO2 21 22  GLUCOSE 173* 141*  BUN 20 19  CREATININE 1.89* 1.61*  CALCIUM  8.9 8.7  MG  --  2.2  PHOS  --  3.6   Liver Function Tests:  Recent Labs Lab 04/04/13 0520  AST 24  ALT 18  ALKPHOS 26*  BILITOT 0.3  PROT 5.4*  ALBUMIN 2.7*   No results found for this basename: LIPASE, AMYLASE,  in the last 168 hours No results found for this basename: AMMONIA,  in the last 168 hours CBC:  Recent Labs Lab 04/03/13 2245  04/04/13 1710 04/05/13 0524 04/06/13 0326 04/07/13 0830 04/07/13 2119 04/08/13 0347  WBC 8.0  < > 5.9 5.4 4.9  --  6.0 6.2  NEUTROABS 6.8  --   --   --   --   --   --   --   HGB 6.8*  < > 7.7* 7.6* 7.2* 6.8* 9.2* 8.7*  HCT 22.2*  < > 23.6* 23.8* 22.4* 21.5* 28.4* 27.6*  MCV 77.9*  < > 78.9 79.6 80.0  --  81.1 81.7  PLT 338  < > 293 297 274  --  318 314  < > = values in this interval not displayed. Cardiac Enzymes: No results found for this basename: CKTOTAL, CKMB, CKMBINDEX, TROPONINI,  in the last 168 hours BNP (last 3 results) No results found for this basename: PROBNP,  in the last 8760 hours CBG:  Recent Labs Lab 04/07/13 1148 04/07/13 1726 04/07/13 1940 04/07/13 2240 04/08/13 0404  GLUCAP 122*  155* 123* 133* 129*    Recent Results (from the past 240 hour(s))  MRSA PCR SCREENING     Status: None   Collection Time    04/05/13  6:01 PM      Result Value Ref Range Status   MRSA by PCR NEGATIVE  NEGATIVE Final   Comment:            The GeneXpert MRSA Assay (FDA     approved for NASAL specimens     only), is one component of a     comprehensive MRSA colonization     surveillance program. It is not     intended to diagnose MRSA     infection nor to guide or     monitor treatment for     MRSA infections.     Studies: No results found.  Scheduled Meds: . acetaminophen  650 mg Oral Once  . aspirin  81 mg Oral QHS  . carboprost (HEMABATE) bladder irrigation - continuous   Bladder Irrigation Q24H   And  . sodium chloride irrigation  3,000 mL Bladder Irrigation Q24H  . carvedilol  3.125 mg Oral BID WC  .  docusate sodium  100 mg Oral BID  . ferrous sulfate  325 mg Oral TID WC  . finasteride  5 mg Oral QPM  . furosemide  20 mg Intravenous Once  . furosemide  40 mg Oral Daily  . hydrALAZINE  75 mg Oral TID  . insulin aspart  0-20 Units Subcutaneous TID WC  . insulin aspart  0-5 Units Subcutaneous QHS  . insulin aspart protamine- aspart  45 Units Subcutaneous BID  . lisinopril  40 mg Oral BID  . sodium chloride  3 mL Intravenous Q12H  . sodium chloride  3 mL Intravenous Q12H  . tamsulosin  0.4 mg Oral BID  . Warfarin - Pharmacist Dosing Inpatient   Does not apply q1800   Continuous Infusions: . sodium chloride 250 mL (04/08/13 0154)    Principal Problem:   Gross hematuria Active Problems:   Severe obstructive sleep apnea   Chronic anticoagulation   History of pulmonary embolism   Acute-on-chronic kidney injury, baseline CKD stage 3   Anemia   Type II or unspecified type diabetes mellitus with unspecified complication, uncontrolled    Time spent: > 35 minutes of critical care time.    Velvet Bathe  [facility] Pager [phone] If 7PM-7AM, please contact night-coverage at [email or web address], password [facility] 04/08/2013, 8:16 AM  LOS: 5 days

## 2013-04-08 NOTE — Progress Notes (Signed)
Filled patients humidification chamber to appropriate level. Patient will self administer CPAP when ready, RT will assist as needed.

## 2013-04-08 NOTE — Anesthesia Postprocedure Evaluation (Signed)
  Anesthesia Post-op Note  Patient: [name]  Procedure(s) Performed: Procedure(s) (LRB): CYSTOSCOPY WITH CLOT EVACUATION (N/A)  Patient Location: PACU  Anesthesia Type: General  Level of Consciousness: awake and alert   Airway and Oxygen Therapy: Patient Spontanous Breathing  Post-op Pain: mild  Post-op Assessment: Post-op Vital signs reviewed, Patient's Cardiovascular Status Stable, Respiratory Function Stable, Patent Airway and No signs of Nausea or vomiting  Last Vitals:  Filed Vitals:   04/08/13 1545  BP: 185/88  Pulse:   Temp:   Resp:     Post-op Vital Signs: stable   Complications: No apparent anesthesia complications

## 2013-04-09 ENCOUNTER — Encounter (HOSPITAL_COMMUNITY): Payer: Self-pay | Admitting: Urology

## 2013-04-09 ENCOUNTER — Other Ambulatory Visit: Payer: Self-pay | Admitting: *Deleted

## 2013-04-09 DIAGNOSIS — T66XXXS Radiation sickness, unspecified, sequela: Secondary | ICD-10-CM | POA: Diagnosis not present

## 2013-04-09 DIAGNOSIS — Y842 Radiological procedure and radiotherapy as the cause of abnormal reaction of the patient, or of later complication, without mention of misadventure at the time of the procedure: Secondary | ICD-10-CM | POA: Diagnosis not present

## 2013-04-09 DIAGNOSIS — N3040 Irradiation cystitis without hematuria: Secondary | ICD-10-CM | POA: Diagnosis present

## 2013-04-09 LAB — CBC
HCT: 27.9 % — ABNORMAL LOW (ref 39.0–52.0)
HEMOGLOBIN: 8.7 g/dL — AB (ref 13.0–17.0)
MCH: 25.8 pg — ABNORMAL LOW (ref 26.0–34.0)
MCHC: 31.2 g/dL (ref 30.0–36.0)
MCV: 82.8 fL (ref 78.0–100.0)
PLATELETS: 289 10*3/uL (ref 150–400)
RBC: 3.37 MIL/uL — AB (ref 4.22–5.81)
RDW: 17.9 % — ABNORMAL HIGH (ref 11.5–15.5)
WBC: 7 10*3/uL (ref 4.0–10.5)

## 2013-04-09 LAB — PROTIME-INR
INR: 1.89 — ABNORMAL HIGH (ref 0.00–1.49)
Prothrombin Time: 21.1 seconds — ABNORMAL HIGH (ref 11.6–15.2)

## 2013-04-09 LAB — GLUCOSE, CAPILLARY: GLUCOSE-CAPILLARY: 116 mg/dL — AB (ref 70–99)

## 2013-04-09 MED ORDER — CARVEDILOL 3.125 MG PO TABS: 3.1250 mg | ORAL_TABLET | Freq: Two times a day (BID) | ORAL | Status: DC

## 2013-04-09 MED ORDER — OXYCODONE-ACETAMINOPHEN 5-325 MG PO TABS: 1.0000 | ORAL_TABLET | ORAL | Status: DC | PRN

## 2013-04-09 NOTE — Progress Notes (Signed)
Patient discharged per MD order.  Discharge instructions given and reviewed with patient and spouse.  Foley cath left in place per MD order.  Patient transported to vehicle in wheelchair.

## 2013-04-09 NOTE — Discharge Summary (Signed)
Physician Discharge Summary  [name] [id number] DOB: 30-Oct-1940 DOA: 04/03/2013  PCP: Melinda Crutch  Admit date: 04/03/2013 Discharge date: 04/09/2013  Time spent: > 35 minutes  Recommendations for Outpatient Follow-up:  1. Will need 2 wk f/u with urologist 2. Also needs hyperbaric chamber treatments 3. Will need INR rechecked in 2-3 days 4. Will need hgb rechecked in 3-6 days.  Discharge Diagnoses:  Principal Problem:   Gross hematuria Active Problems:   Severe obstructive sleep apnea   Chronic anticoagulation   History of pulmonary embolism   Acute blood loss anemia   Acute-on-chronic kidney injury, baseline CKD stage 3   Anemia   Type II or unspecified type diabetes mellitus with unspecified complication, uncontrolled   Discharge Condition: stable  Diet recommendation: diabetic  Filed Weights   04/04/13 0428 04/07/13 0400  Weight: 135.1 kg (297 lb 13.5 oz) 134.8 kg (297 lb 2.9 oz)    History of present illness:  Pt is a 73 y/o presenting with radiation induced cystitis and subsequent hematuria. Has history of saddle emboli on coumadin.    Hospital Course:  1. Acute blood loss anemia - 2ary to radiation-induced cystitis  - monitor cbc daily  - Add ferrous sulfate supplementation  - No active bleeding and hgb steady  2. 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  D/c carboprost.  I would recommend discharge home to day so that he can continue w/ hyperbaric oxygen treatments as this is the most likely therapy to prevent this form happening again. He has an appointment tomorrow.  He will likely need pain medication such as percocet or vicodin on discharge home. He already has rx for bladder spasm meds at home.  F/u w/ me in 2 weeks.  Pt is s/p cystoscopy with clot evacuation and bladder fulguration  3. PE  - Had a large saddle PE and residual DVT.  -  Makes taking him off coumadin difficult.  - It is my opinion that we cannot stop anticoagulation at this point.  - Continue warfarin cautiously. No active bleeding currently. Patient is to get his INR checked in 2-3 days  4. DM  - Pt to continue home regimen and diabetic diet   Procedures:  None  Consultations:  Urology  Discharge Exam: Filed Vitals:   04/09/13 0800  BP: 113/50  Pulse: 55  Temp: 98.1 F (36.7 C)  Resp: 18    General: Pt in NAD, alert and awake Cardiovascular: RRR, no MRG Respiratory: CTA BL, no wheezes, breathing comfortably on room air.  Discharge Instructions  Discharge Orders   Future Orders Complete By Expires   Call MD for:  difficulty breathing, headache or visual disturbances  As directed    Call MD for:  temperature >100.4  As directed    Diet - low sodium heart healthy  As directed    Discharge instructions  As directed    Comments:     F/u with Urologist in 2 weeks. Continue your hyperbaric chamber treatments.   Please have your INR rechecked in 2-3 days   Increase activity slowly  As directed        Medication List         alendronate 70 MG tablet  Commonly known as:  FOSAMAX  Take 1 tablet by mouth every Monday.     aspirin 81 MG tablet  Take 81 mg by mouth at bedtime.  carvedilol 3.125 MG tablet  Commonly known as:  COREG  Take 1 tablet (3.125 mg total) by mouth 2 (two) times daily with a meal.     fenofibrate 145 MG tablet  Commonly known as:  TRICOR  Take 145 mg by mouth daily.     ferrous sulfate 325 (65 FE) MG EC tablet  Take 325 mg by mouth daily.     finasteride 5 MG tablet  Commonly known as:  PROSCAR  Take 5 mg by mouth every evening.     furosemide 40 MG tablet  Commonly known as:  LASIX  Take 40 mg by mouth daily.     hydrALAZINE 25 MG tablet  Commonly known as:  APRESOLINE  Take 3 tablets (75 mg total) by mouth 3 (three) times daily.     hyoscyamine 0.125 MG tablet  Commonly known as:  LEVSIN,  ANASPAZ  Take 1 tablet (0.125 mg total) by mouth every 4 (four) hours as needed (bladder spasms).     Niacin CR 1000 MG Tbcr  Take 1,000 mg by mouth at bedtime.     NOVOLOG MIX 70/30 (70-30) 100 UNIT/ML injection  Generic drug:  insulin aspart protamine- aspart  Inject 70 Units into the skin 2 (two) times daily.     nystatin 100000 UNIT/GM Powd  Apply topically 2 (two) times daily.     oxybutynin 5 MG tablet  Commonly known as:  DITROPAN  Take 1 tablet (5 mg total) by mouth every 6 (six) hours as needed for bladder spasms.     oxyCODONE-acetaminophen 5-325 MG per tablet  Commonly known as:  PERCOCET  Take 1 tablet by mouth every 4 (four) hours as needed for moderate pain.     potassium chloride 10 MEQ tablet  Commonly known as:  K-DUR,KLOR-CON  Take 10 mEq by mouth daily.     quinapril 40 MG tablet  Commonly known as:  ACCUPRIL  Take 40 mg by mouth 2 (two) times daily.     senna-docusate 8.6-50 MG per tablet  Commonly known as:  Senokot-S  Take 1 tablet by mouth daily.     silver sulfADIAZINE 1 % cream  Commonly known as:  SILVADENE  Apply 1 application topically daily.     tamsulosin 0.4 MG Caps capsule  Commonly known as:  FLOMAX  Take 0.4 mg by mouth 2 (two) times daily.     warfarin 5 MG tablet  Commonly known as:  COUMADIN  Take 5-7.5 mg by mouth daily. 5 mg on Monday, Wednesday, and Friday.  Take 7.55 mg on Tuesday, Thursday, Saturday, and Sunday.       No Known Allergies    The results of significant diagnostics from this hospitalization (including imaging, microbiology, ancillary and laboratory) are listed below for reference.    Significant Diagnostic Studies: Dg Chest 2 View  03/18/2013   CLINICAL DATA:  Preoperative respiratory exam  EXAM: CHEST  2 VIEW  COMPARISON:  07/19/2009  FINDINGS: Heart size is normal. There is a hiatal hernia. The lungs are clear. No effusions. No acute bony findings.  IMPRESSION: No active cardiopulmonary disease.  Hiatal  hernia.   Electronically Signed   By: Nelson Chimes M.D.   On: 03/18/2013 16:43    Microbiology: Recent Results (from the past 240 hour(s))  MRSA PCR SCREENING     Status: None   Collection Time    04/05/13  6:01 PM      Result Value Ref Range Status   MRSA by PCR  NEGATIVE  NEGATIVE Final   Comment:            The GeneXpert MRSA Assay (FDA     approved for NASAL specimens     only), is one component of a     comprehensive MRSA colonization     surveillance program. It is not     intended to diagnose MRSA     infection nor to guide or     monitor treatment for     MRSA infections.     Labs: Basic Metabolic Panel:  Recent Labs Lab 04/03/13 2245 04/04/13 0520  NA 132* 134*  K 4.4 4.2  CL 95* 98  CO2 21 22  GLUCOSE 173* 141*  BUN 20 19  CREATININE 1.89* 1.61*  CALCIUM 8.9 8.7  MG  --  2.2  PHOS  --  3.6   Liver Function Tests:  Recent Labs Lab 04/04/13 0520  AST 24  ALT 18  ALKPHOS 26*  BILITOT 0.3  PROT 5.4*  ALBUMIN 2.7*   No results found for this basename: LIPASE, AMYLASE,  in the last 168 hours No results found for this basename: AMMONIA,  in the last 168 hours CBC:  Recent Labs Lab 04/03/13 2245  04/06/13 0326 04/07/13 0830 04/07/13 2119 04/08/13 0347 04/08/13 1935 04/09/13 0650  WBC 8.0  < > 4.9  --  6.0 6.2 7.5 7.0  NEUTROABS 6.8  --   --   --   --   --   --   --   HGB 6.8*  < > 7.2* 6.8* 9.2* 8.7* 9.3* 8.7*  HCT 22.2*  < > 22.4* 21.5* 28.4* 27.6* 29.4* 27.9*  MCV 77.9*  < > 80.0  --  81.1 81.7 81.9 82.8  PLT 338  < > 274  --  318 314 294 289  < > = values in this interval not displayed. Cardiac Enzymes: No results found for this basename: CKTOTAL, CKMB, CKMBINDEX, TROPONINI,  in the last 168 hours BNP: BNP (last 3 results) No results found for this basename: PROBNP,  in the last 8760 hours CBG:  Recent Labs Lab 04/08/13 0749 04/08/13 1146 04/08/13 1529 04/08/13 2146 04/09/13 0734  GLUCAP 144* 117* 143* 116* 116*        Signed:  Velvet Bathe  [facility] 04/09/2013, 9:35 AM

## 2013-04-09 NOTE — Telephone Encounter (Signed)
Rx was sent to pharmacy electronically. 

## 2013-04-09 NOTE — H&P (Signed)
Urology Progress Note  Subjective:     No further gross hematuria since surgery. Carboprost just completed. Positive bladder spasms.  ROS: Negative: SOB or chest pain.  Objective:  Patient Vitals for the past 24 hrs:  BP Temp Temp src Pulse Resp SpO2  04/09/13 0500 102/46 mmHg - - 51 14 95 %  04/09/13 0400 121/51 mmHg - - 51 15 95 %  04/09/13 0300 122/56 mmHg - - 51 14 97 %  04/09/13 0200 104/48 mmHg - - 51 14 95 %  04/09/13 0100 116/54 mmHg - - 57 18 96 %  04/09/13 0000 135/65 mmHg - - 56 15 98 %  04/08/13 2337 134/58 mmHg 98.2 F (36.8 C) Oral - - -  04/08/13 2302 134/58 mmHg - - - - -  04/08/13 2300 134/58 mmHg - - 57 17 99 %  04/08/13 2200 142/66 mmHg - - 57 17 97 %  04/08/13 2100 149/71 mmHg - - 57 22 100 %  04/08/13 2000 110/58 mmHg 98.5 F (36.9 C) Axillary 57 15 100 %  04/08/13 1900 148/84 mmHg - - 71 16 98 %  04/08/13 1640 181/71 mmHg - - 62 16 98 %  04/08/13 1615 - 97.9 F (36.6 C) - - - -  04/08/13 1600 - - - - 30 -  04/08/13 1545 185/88 mmHg - - - - -  04/08/13 1514 149/79 mmHg 97.7 F (36.5 C) - 66 18 100 %  04/08/13 1150 - 98.1 F (36.7 C) Oral - - -  04/08/13 0800 155/65 mmHg 97.9 F (36.6 C) Oral 60 17 96 %    Physical Exam: General:  No acute distress, awake Cardiovascular:    [x]   S1/S2 present, RRR  []   Irregularly irregular Chest:  CTA-B Abdomen:               []  Soft, appropriately TTP  [x]  Soft, NTTP  []  Soft, appropriately TTP, incision(s) clean/dry/intact  Genitourinary: Negative edema. Foley in place. Foley:  Draining clear urine carboprost running at 100 cc/hr.    I/O last 3 completed shifts: In: 10175.5 [P.O.:120; I.V.:1145.5; Other:8910] Out: 85631 [SHFWY:63785; Blood:20]  Recent Labs     04/08/13  1935  04/09/13  0650  HGB  9.3*  8.7*  WBC  7.5  7.0  PLT  294  289    No results found for this basename: NA, K, CL, CO2, BUN, CREATININE, CALCIUM, MAGNESIUM, GFRNONAA, GFRAA,  in the last 72 hours   Recent Labs   04/08/13  0347  04/09/13  0328  INR  1.63*  1.89*     No components found with this basename: ABG,     Length of stay: 6 days.  Assessment: Gross hematuria. Blood loss on chronic anemia.    Plan: Gross hematuria resolved.  D/c carboprost.  I would recommend discharge home to day so that he can continue w/ hyperbaric oxygen treatments as this is the most likely therapy to prevent this form happening again. He has an appointment tomorrow.  He will likely need pain medication such as percocet or vicodin on discharge home. He already has rx for bladder spasm meds at home.  F/u w/ me in 2 weeks.  Appreciate management by [facility].  Rolan Bucco, MD [phone]

## 2013-04-10 DIAGNOSIS — T66XXXS Radiation sickness, unspecified, sequela: Secondary | ICD-10-CM | POA: Diagnosis not present

## 2013-04-10 DIAGNOSIS — N3040 Irradiation cystitis without hematuria: Secondary | ICD-10-CM | POA: Diagnosis present

## 2013-04-10 DIAGNOSIS — Y842 Radiological procedure and radiotherapy as the cause of abnormal reaction of the patient, or of later complication, without mention of misadventure at the time of the procedure: Secondary | ICD-10-CM | POA: Diagnosis not present

## 2013-04-13 DIAGNOSIS — T66XXXS Radiation sickness, unspecified, sequela: Secondary | ICD-10-CM | POA: Diagnosis not present

## 2013-04-13 DIAGNOSIS — N3040 Irradiation cystitis without hematuria: Secondary | ICD-10-CM | POA: Diagnosis not present

## 2013-04-13 LAB — GLUCOSE, CAPILLARY
GLUCOSE-CAPILLARY: 128 mg/dL — AB (ref 70–99)
Glucose-Capillary: 133 mg/dL — ABNORMAL HIGH (ref 70–99)
Glucose-Capillary: 131 mg/dL — ABNORMAL HIGH (ref 70–99)
Glucose-Capillary: 125 mg/dL — ABNORMAL HIGH (ref 70–99)
Glucose-Capillary: 132 mg/dL — ABNORMAL HIGH (ref 70–99)
Glucose-Capillary: 107 mg/dL — ABNORMAL HIGH (ref 70–99)
Glucose-Capillary: 110 mg/dL — ABNORMAL HIGH (ref 70–99)

## 2013-04-14 DIAGNOSIS — N3040 Irradiation cystitis without hematuria: Secondary | ICD-10-CM | POA: Diagnosis not present

## 2013-04-14 DIAGNOSIS — T66XXXS Radiation sickness, unspecified, sequela: Secondary | ICD-10-CM | POA: Diagnosis not present

## 2013-04-15 DIAGNOSIS — T66XXXS Radiation sickness, unspecified, sequela: Secondary | ICD-10-CM | POA: Diagnosis not present

## 2013-04-15 DIAGNOSIS — N3040 Irradiation cystitis without hematuria: Secondary | ICD-10-CM | POA: Diagnosis not present

## 2013-04-16 DIAGNOSIS — T66XXXS Radiation sickness, unspecified, sequela: Secondary | ICD-10-CM | POA: Diagnosis not present

## 2013-04-16 DIAGNOSIS — N3040 Irradiation cystitis without hematuria: Secondary | ICD-10-CM | POA: Diagnosis not present

## 2013-04-17 DIAGNOSIS — T66XXXS Radiation sickness, unspecified, sequela: Secondary | ICD-10-CM | POA: Diagnosis not present

## 2013-04-17 DIAGNOSIS — N3040 Irradiation cystitis without hematuria: Secondary | ICD-10-CM | POA: Diagnosis not present

## 2013-04-17 LAB — GLUCOSE, CAPILLARY
GLUCOSE-CAPILLARY: 124 mg/dL — AB (ref 70–99)
GLUCOSE-CAPILLARY: 128 mg/dL — AB (ref 70–99)
GLUCOSE-CAPILLARY: 138 mg/dL — AB (ref 70–99)
Glucose-Capillary: 157 mg/dL — ABNORMAL HIGH (ref 70–99)
Glucose-Capillary: 122 mg/dL — ABNORMAL HIGH (ref 70–99)
Glucose-Capillary: 162 mg/dL — ABNORMAL HIGH (ref 70–99)
Glucose-Capillary: 117 mg/dL — ABNORMAL HIGH (ref 70–99)
Glucose-Capillary: 123 mg/dL — ABNORMAL HIGH (ref 70–99)
Glucose-Capillary: 116 mg/dL — ABNORMAL HIGH (ref 70–99)

## 2013-04-20 DIAGNOSIS — T66XXXS Radiation sickness, unspecified, sequela: Secondary | ICD-10-CM | POA: Diagnosis not present

## 2013-04-20 DIAGNOSIS — N3040 Irradiation cystitis without hematuria: Secondary | ICD-10-CM | POA: Diagnosis not present

## 2013-04-20 LAB — GLUCOSE, CAPILLARY
GLUCOSE-CAPILLARY: 123 mg/dL — AB (ref 70–99)
Glucose-Capillary: 140 mg/dL — ABNORMAL HIGH (ref 70–99)

## 2013-04-21 DIAGNOSIS — N3040 Irradiation cystitis without hematuria: Secondary | ICD-10-CM | POA: Diagnosis not present

## 2013-04-21 DIAGNOSIS — T66XXXS Radiation sickness, unspecified, sequela: Secondary | ICD-10-CM | POA: Diagnosis not present

## 2013-04-21 LAB — GLUCOSE, CAPILLARY
GLUCOSE-CAPILLARY: 144 mg/dL — AB (ref 70–99)
GLUCOSE-CAPILLARY: 125 mg/dL — AB (ref 70–99)
Glucose-Capillary: 113 mg/dL — ABNORMAL HIGH (ref 70–99)

## 2013-04-22 DIAGNOSIS — T66XXXS Radiation sickness, unspecified, sequela: Secondary | ICD-10-CM | POA: Diagnosis not present

## 2013-04-22 DIAGNOSIS — N3040 Irradiation cystitis without hematuria: Secondary | ICD-10-CM | POA: Diagnosis not present

## 2013-04-22 LAB — GLUCOSE, CAPILLARY
GLUCOSE-CAPILLARY: 129 mg/dL — AB (ref 70–99)
GLUCOSE-CAPILLARY: 151 mg/dL — AB (ref 70–99)

## 2013-04-23 ENCOUNTER — Encounter: Payer: Self-pay | Admitting: Cardiovascular Disease

## 2013-04-23 ENCOUNTER — Ambulatory Visit (INDEPENDENT_AMBULATORY_CARE_PROVIDER_SITE_OTHER): Payer: BC Managed Care – PPO | Admitting: Cardiovascular Disease

## 2013-04-23 VITALS — BP 126/68 | HR 59 | Ht 70.0 in | Wt 291.2 lb

## 2013-04-23 DIAGNOSIS — N3040 Irradiation cystitis without hematuria: Secondary | ICD-10-CM | POA: Diagnosis not present

## 2013-04-23 DIAGNOSIS — I10 Essential (primary) hypertension: Secondary | ICD-10-CM

## 2013-04-23 DIAGNOSIS — Z905 Acquired absence of kidney: Secondary | ICD-10-CM

## 2013-04-23 DIAGNOSIS — T66XXXS Radiation sickness, unspecified, sequela: Secondary | ICD-10-CM | POA: Diagnosis not present

## 2013-04-23 DIAGNOSIS — I872 Venous insufficiency (chronic) (peripheral): Secondary | ICD-10-CM

## 2013-04-23 DIAGNOSIS — Z7901 Long term (current) use of anticoagulants: Secondary | ICD-10-CM

## 2013-04-23 DIAGNOSIS — N179 Acute kidney failure, unspecified: Secondary | ICD-10-CM

## 2013-04-23 DIAGNOSIS — R310 Gross hematuria: Secondary | ICD-10-CM

## 2013-04-23 DIAGNOSIS — N189 Chronic kidney disease, unspecified: Secondary | ICD-10-CM

## 2013-04-23 DIAGNOSIS — G4733 Obstructive sleep apnea (adult) (pediatric): Secondary | ICD-10-CM

## 2013-04-23 NOTE — Patient Instructions (Addendum)
Your physician has recommended you make the following change in your medication: stop your aspirin.  Your physician recommends that you schedule a follow-up appointment in: 6 months with Dr. Claiborne Billings for cardiology care.

## 2013-04-24 DIAGNOSIS — T66XXXS Radiation sickness, unspecified, sequela: Secondary | ICD-10-CM | POA: Diagnosis not present

## 2013-04-24 DIAGNOSIS — N3040 Irradiation cystitis without hematuria: Secondary | ICD-10-CM | POA: Diagnosis not present

## 2013-04-24 LAB — GLUCOSE, CAPILLARY
GLUCOSE-CAPILLARY: 155 mg/dL — AB (ref 70–99)
Glucose-Capillary: 136 mg/dL — ABNORMAL HIGH (ref 70–99)
Glucose-Capillary: 165 mg/dL — ABNORMAL HIGH (ref 70–99)
Glucose-Capillary: 158 mg/dL — ABNORMAL HIGH (ref 70–99)

## 2013-04-27 DIAGNOSIS — T66XXXS Radiation sickness, unspecified, sequela: Secondary | ICD-10-CM | POA: Diagnosis not present

## 2013-04-27 DIAGNOSIS — N3040 Irradiation cystitis without hematuria: Secondary | ICD-10-CM | POA: Diagnosis not present

## 2013-04-27 LAB — GLUCOSE, CAPILLARY
Glucose-Capillary: 151 mg/dL — ABNORMAL HIGH (ref 70–99)
Glucose-Capillary: 141 mg/dL — ABNORMAL HIGH (ref 70–99)

## 2013-04-28 DIAGNOSIS — N3040 Irradiation cystitis without hematuria: Secondary | ICD-10-CM | POA: Diagnosis not present

## 2013-04-28 DIAGNOSIS — T66XXXS Radiation sickness, unspecified, sequela: Secondary | ICD-10-CM | POA: Diagnosis not present

## 2013-04-28 LAB — GLUCOSE, CAPILLARY
GLUCOSE-CAPILLARY: 129 mg/dL — AB (ref 70–99)
Glucose-Capillary: 141 mg/dL — ABNORMAL HIGH (ref 70–99)

## 2013-04-29 DIAGNOSIS — N3040 Irradiation cystitis without hematuria: Secondary | ICD-10-CM | POA: Diagnosis not present

## 2013-04-29 DIAGNOSIS — T66XXXS Radiation sickness, unspecified, sequela: Secondary | ICD-10-CM | POA: Diagnosis not present

## 2013-04-29 LAB — GLUCOSE, CAPILLARY
GLUCOSE-CAPILLARY: 197 mg/dL — AB (ref 70–99)
GLUCOSE-CAPILLARY: 146 mg/dL — AB (ref 70–99)

## 2013-04-30 DIAGNOSIS — N3040 Irradiation cystitis without hematuria: Secondary | ICD-10-CM | POA: Diagnosis not present

## 2013-04-30 DIAGNOSIS — T66XXXS Radiation sickness, unspecified, sequela: Secondary | ICD-10-CM | POA: Diagnosis not present

## 2013-04-30 LAB — GLUCOSE, CAPILLARY
Glucose-Capillary: 167 mg/dL — ABNORMAL HIGH (ref 70–99)
Glucose-Capillary: 169 mg/dL — ABNORMAL HIGH (ref 70–99)

## 2013-05-01 DIAGNOSIS — T66XXXS Radiation sickness, unspecified, sequela: Secondary | ICD-10-CM | POA: Diagnosis not present

## 2013-05-01 DIAGNOSIS — N3040 Irradiation cystitis without hematuria: Secondary | ICD-10-CM | POA: Diagnosis not present

## 2013-05-01 LAB — GLUCOSE, CAPILLARY
Glucose-Capillary: 149 mg/dL — ABNORMAL HIGH (ref 70–99)
Glucose-Capillary: 145 mg/dL — ABNORMAL HIGH (ref 70–99)

## 2013-05-04 DIAGNOSIS — N3040 Irradiation cystitis without hematuria: Secondary | ICD-10-CM | POA: Diagnosis not present

## 2013-05-04 DIAGNOSIS — T66XXXS Radiation sickness, unspecified, sequela: Secondary | ICD-10-CM | POA: Diagnosis not present

## 2013-05-04 LAB — GLUCOSE, CAPILLARY
GLUCOSE-CAPILLARY: 150 mg/dL — AB (ref 70–99)
Glucose-Capillary: 163 mg/dL — ABNORMAL HIGH (ref 70–99)

## 2013-05-05 DIAGNOSIS — T66XXXS Radiation sickness, unspecified, sequela: Secondary | ICD-10-CM | POA: Diagnosis not present

## 2013-05-05 DIAGNOSIS — N3040 Irradiation cystitis without hematuria: Secondary | ICD-10-CM | POA: Diagnosis not present

## 2013-05-05 LAB — GLUCOSE, CAPILLARY
GLUCOSE-CAPILLARY: 162 mg/dL — AB (ref 70–99)
Glucose-Capillary: 157 mg/dL — ABNORMAL HIGH (ref 70–99)

## 2013-05-06 ENCOUNTER — Encounter (HOSPITAL_BASED_OUTPATIENT_CLINIC_OR_DEPARTMENT_OTHER): Payer: BC Managed Care – PPO | Attending: General Surgery

## 2013-05-06 DIAGNOSIS — T66XXXS Radiation sickness, unspecified, sequela: Secondary | ICD-10-CM | POA: Diagnosis not present

## 2013-05-06 DIAGNOSIS — Y842 Radiological procedure and radiotherapy as the cause of abnormal reaction of the patient, or of later complication, without mention of misadventure at the time of the procedure: Secondary | ICD-10-CM | POA: Diagnosis not present

## 2013-05-06 DIAGNOSIS — N3040 Irradiation cystitis without hematuria: Secondary | ICD-10-CM | POA: Insufficient documentation

## 2013-05-06 LAB — GLUCOSE, CAPILLARY
GLUCOSE-CAPILLARY: 160 mg/dL — AB (ref 70–99)
GLUCOSE-CAPILLARY: 135 mg/dL — AB (ref 70–99)

## 2013-05-07 DIAGNOSIS — N3040 Irradiation cystitis without hematuria: Secondary | ICD-10-CM | POA: Diagnosis not present

## 2013-05-07 LAB — GLUCOSE, CAPILLARY
GLUCOSE-CAPILLARY: 179 mg/dL — AB (ref 70–99)
Glucose-Capillary: 198 mg/dL — ABNORMAL HIGH (ref 70–99)

## 2013-05-08 DIAGNOSIS — N3040 Irradiation cystitis without hematuria: Secondary | ICD-10-CM | POA: Diagnosis not present

## 2013-05-08 LAB — GLUCOSE, CAPILLARY
GLUCOSE-CAPILLARY: 171 mg/dL — AB (ref 70–99)
Glucose-Capillary: 120 mg/dL — ABNORMAL HIGH (ref 70–99)

## 2013-05-11 DIAGNOSIS — N3040 Irradiation cystitis without hematuria: Secondary | ICD-10-CM | POA: Diagnosis not present

## 2013-05-11 LAB — GLUCOSE, CAPILLARY
Glucose-Capillary: 224 mg/dL — ABNORMAL HIGH (ref 70–99)
Glucose-Capillary: 175 mg/dL — ABNORMAL HIGH (ref 70–99)

## 2013-05-12 DIAGNOSIS — N3040 Irradiation cystitis without hematuria: Secondary | ICD-10-CM | POA: Diagnosis not present

## 2013-05-12 LAB — GLUCOSE, CAPILLARY
GLUCOSE-CAPILLARY: 163 mg/dL — AB (ref 70–99)
Glucose-Capillary: 146 mg/dL — ABNORMAL HIGH (ref 70–99)

## 2013-05-13 DIAGNOSIS — N3040 Irradiation cystitis without hematuria: Secondary | ICD-10-CM | POA: Diagnosis not present

## 2013-05-13 LAB — GLUCOSE, CAPILLARY
GLUCOSE-CAPILLARY: 159 mg/dL — AB (ref 70–99)
Glucose-Capillary: 190 mg/dL — ABNORMAL HIGH (ref 70–99)

## 2013-05-14 DIAGNOSIS — N3040 Irradiation cystitis without hematuria: Secondary | ICD-10-CM | POA: Diagnosis not present

## 2013-05-14 LAB — GLUCOSE, CAPILLARY
GLUCOSE-CAPILLARY: 156 mg/dL — AB (ref 70–99)
GLUCOSE-CAPILLARY: 135 mg/dL — AB (ref 70–99)

## 2013-05-15 DIAGNOSIS — N3040 Irradiation cystitis without hematuria: Secondary | ICD-10-CM | POA: Diagnosis not present

## 2013-05-15 LAB — GLUCOSE, CAPILLARY
GLUCOSE-CAPILLARY: 223 mg/dL — AB (ref 70–99)
Glucose-Capillary: 172 mg/dL — ABNORMAL HIGH (ref 70–99)

## 2013-05-18 DIAGNOSIS — N3040 Irradiation cystitis without hematuria: Secondary | ICD-10-CM | POA: Diagnosis not present

## 2013-05-18 LAB — GLUCOSE, CAPILLARY
Glucose-Capillary: 197 mg/dL — ABNORMAL HIGH (ref 70–99)
Glucose-Capillary: 114 mg/dL — ABNORMAL HIGH (ref 70–99)

## 2013-05-19 DIAGNOSIS — N3040 Irradiation cystitis without hematuria: Secondary | ICD-10-CM | POA: Diagnosis not present

## 2013-05-19 LAB — GLUCOSE, CAPILLARY
GLUCOSE-CAPILLARY: 204 mg/dL — AB (ref 70–99)
Glucose-Capillary: 147 mg/dL — ABNORMAL HIGH (ref 70–99)

## 2013-05-20 DIAGNOSIS — N3040 Irradiation cystitis without hematuria: Secondary | ICD-10-CM | POA: Diagnosis not present

## 2013-05-20 LAB — GLUCOSE, CAPILLARY
Glucose-Capillary: 212 mg/dL — ABNORMAL HIGH (ref 70–99)
Glucose-Capillary: 151 mg/dL — ABNORMAL HIGH (ref 70–99)

## 2013-05-21 DIAGNOSIS — N3040 Irradiation cystitis without hematuria: Secondary | ICD-10-CM | POA: Diagnosis not present

## 2013-05-21 LAB — GLUCOSE, CAPILLARY
GLUCOSE-CAPILLARY: 224 mg/dL — AB (ref 70–99)
Glucose-Capillary: 129 mg/dL — ABNORMAL HIGH (ref 70–99)

## 2013-05-24 ENCOUNTER — Encounter: Payer: Self-pay | Admitting: Cardiovascular Disease

## 2013-05-24 NOTE — Progress Notes (Signed)
Patient ID: [name], male   DOB: 1940-11-22, 73 y.o.   MRN: [id number]      HPI:  [name] is a 73 year old former patient of Dr. Terance Ice who establish cardiology care with me in October 2014.  He now presents for followup  Evaluation following reinstitution of CPAP therapy for severe sleep apnea.  [name] has a complex medical history including severe exogenous obesity, venous insufficiency, history of prostate cancer and he is also status post left nephrectomy. He has had difficulty with hypertension. He also said significant problems with lower extremity edema and has documented venous insufficiency of both his right and left superficial saphenous veins. He has a remote history of DVT/PE and has been on anticoagulation therapy with Coumadin.  In 2009 he was referred for a sleep study which revealed severe obstructive sleep apnea with an AHI on the baseline portion of a split night protocol in excess of 100. Since 2009 he has been using CPAP therapy and has been on a 14 cm water pressure. Apparently, in September his machine no longer functioned. Within  a 10 day period without the unit he could not sleep well. He had a canceled numerous appointments due to marked residual daytime sleepiness and the sensation that he would fall asleep while driving. He was given a Metallurgist by [facility] since his prior [employer] SMS is no longer available. He was given a CPAP although unit and initial review of this short download indicates that this has only been titrated up to 12 cm and he had a residual AHI improved at 2.9. When I saw him in October, I referred him for a followup sleep study prior to obtaining a newly seen. The sleep study was done as a split night protocol on 12/26/2012. This again confirms severe obstructive sleep apnea with an HI overall of 84.9 per hour. He was unable to achieve REM sleep on the baseline portion of the study. Oxygen saturation dropped to 83%  with non-REM sleep. He was loud snoring. CPAP therapy was instituted and was titrated up to 16 cm water pressure which was the recommended pressure. He will be getting a new machine and will be referred to [facility] for this since they have provided him with the loaner unit prior to his most recent study.  There is  a history of prior weight gain. He admits to progressive lower extremity swelling. When I last saw him, I discontinued his amlodipine which was undoubtedly playing a contributory role in his previous 3-4+ lower edema. His last echo Doppler study was in 2012 which showed moderate concentric left ventricular hypertrophy with an ejection fraction of greater than 55%. He did have mild pulmonary hypertension with estimated RV systolic pressure 39 mm. He did have aortic sclerosis without stenosis, trace mitral regurgitation, and trace tricuspid regurgitation. One is mostly consistent echo Doppler study of 12/16/2012 ejection fraction was 55-60%. There was mild mitral regurgitation, aortic sclerosis without stenosis. Estimated RV systolic pressure was normal at 23 mm.  [name] was in the hospital from  January 26 - February 7 and then again from February 27 through March 5 with urologic bleeding issues.   [name] has been using a new CPAP machine, which is the air cells, 10 AutoSet model from [employer], which has recently been released.  He said a 16 cm water pressure.  Review of download from 03/21/2013 through 04/19/2013 reveals a very good compliance with 97% of the days used, and  93% with days.  Use, greater than 4 hours.  AHI is 1.7.  He is using an Copy air fullface mask and is tolerating this well without significant leak.  He denies any residual daytime sleepiness.  I did calculate a new at the [place] Sleepiness Scale court today and does endorse that 5, arguing against residual daytime sleepiness.  There is only a moderate chance of dozing while lying down in the afternoon when circumstances  permit and only a very slight chance of dozing while sitting and reading, watching television, and as a passenger in a car for an hour without a break.    Past Medical History  Diagnosis Date  . Obesity   . Phlebitis     Lower extermity  . Pulmonary emboli 2008    submassive, saddle  . Prostate cancer 07/2009  . Sleep apnea     on CPAP  . Hx of echocardiogram 12/04/2010    Normal EF >55% no significant valve disease  . History of stress test 06/27/2009    Low risk and EF of approximately 50%  . DVT (deep venous thrombosis)   . Diabetes mellitus   . Chronic kidney disease, stage 3     baseline creatinine ~1.4  . HLD (hyperlipidemia)   . HTN (hypertension)     Past Surgical History  Procedure Laterality Date  . Nephrectomy  1999    for CA  . Cholecystectomy  1999  . Transurethral resection of bladder tumor N/A 03/04/2013    Procedure: CYSTOSCOPY WITH RIGHT RETROGRADE PYELOGRAM AND BLADDER BIOPSY /CLOT EVACUATION/ BIOPSY PROSTATIC URETHRA WITH FULGERATION ;  Surgeon: Molli Hazard, MD;  Location: [facility];  Service: Urology;  Laterality: N/A;  . Cystoscopy w/ retrogrades N/A 04/08/2013    Procedure: CYSTOSCOPY WITH CLOT EVACUATION;  Surgeon: Molli Hazard, MD;  Location: [facility];  Service: Urology;  Laterality: N/A;    No Known Allergies  Current Outpatient Prescriptions  Medication Sig Dispense Refill  . alendronate (FOSAMAX) 70 MG tablet Take 1 tablet by mouth every Monday.       Marland Kitchen aspirin 81 MG tablet Take 81 mg by mouth at bedtime.       . carvedilol (COREG) 3.125 MG tablet Take 1 tablet (3.125 mg total) by mouth 2 (two) times daily with a meal.  60 tablet  9  . fenofibrate (TRICOR) 145 MG tablet Take 145 mg by mouth daily.       . ferrous sulfate 325 (65 FE) MG EC tablet Take 325 mg by mouth daily.      . finasteride (PROSCAR) 5 MG tablet Take 5 mg by mouth every evening.      . furosemide (LASIX) 40 MG tablet Take 40 mg by mouth daily.      . hyoscyamine  (LEVSIN, ANASPAZ) 0.125 MG tablet Take 1 tablet (0.125 mg total) by mouth every 4 (four) hours as needed (bladder spasms).  40 tablet  4  . Niacin CR 1000 MG TBCR Take 1,000 mg by mouth at bedtime.       Marland Kitchen nystatin (MYCOSTATIN/NYSTOP) 100000 UNIT/GM POWD Apply topically 2 (two) times daily.      Marland Kitchen oxybutynin (DITROPAN) 5 MG tablet Take 1 tablet (5 mg total) by mouth every 6 (six) hours as needed for bladder spasms.  40 tablet  4  . oxyCODONE-acetaminophen (PERCOCET) 5-325 MG per tablet Take 1 tablet by mouth every 4 (four) hours as needed for moderate pain.  20 tablet  0  . potassium  chloride (K-DUR,KLOR-CON) 10 MEQ tablet Take 10 mEq by mouth daily.      . quinapril (ACCUPRIL) 40 MG tablet Take 40 mg by mouth 2 (two) times daily.       Marland Kitchen senna-docusate (SENOKOT-S) 8.6-50 MG per tablet Take 1 tablet by mouth daily.      . silver sulfADIAZINE (SILVADENE) 1 % cream Apply 1 application topically daily.      . tamsulosin (FLOMAX) 0.4 MG CAPS capsule Take 0.4 mg by mouth 2 (two) times daily.      Marland Kitchen warfarin (COUMADIN) 5 MG tablet Take 5-7.5 mg by mouth daily. 5 mg on Monday, Wednesday, and Friday.  Take 7.55 mg on Tuesday, Thursday, Saturday, and Sunday.       No current facility-administered medications for this visit.    Social history is notable in that he is married. He works as a Freight forwarder in [employer]. He does   Travel for his job. He completed 12th grade. There is no recent tobacco or alcohol use.  Family History  Problem Relation Age of Onset  . Cancer Mother   . Diabetes Father   . Cancer Father     ROS is negative for fever chills or night sweats. He does have morbid obesity and has lost 44 pounds since his last office visit. He denies any rashes or skin lesions. He denies visual changes. He does note shortness of breath with activity. He denies chest pressure. He is unaware of palpitations.  He denies nausea, vomiting, or diarrhea.  He denies any further urologic  bleeding since his hospitalization.  He does note weight gain. He admits to progressive leg swelling. He . He denies change in bowel or bladder habits.  He denies recent bleeding. He does have hyperlipidemia on therapy.  He denies psychological issues. He is unaware of diabetes.  He feels much better since reinstituting CPAP therapy and notes a marked improvement from his prior equipment.  Other comprehensive 14 point system review is negative.  PE BP 126/68  Pulse 59  Ht 5\' 10"  (1.778 m)  Wt 291 lb 3.2 oz (132.087 kg)  BMI 41.78 kg/m2 General: Alert, oriented, no distress; morbidly obese  Skin: normal turgor, no rashes HEENT: Normocephalic, atraumatic. Pupils round and reactive; sclera anicteric; Fundi arteriolar narrowing. Nose without nasal septal hypertrophy Mouth/Parynx benign; Mallinpatti scale 3/4 Neck: No JVD, no carotid briuts; normal upstroke Chest: Nontender to palpation Lungs: clear to ausculatation and percussion; no wheezing or rales Heart: RRR, s1 s2 normal 2/6 systolic murmur in the aortic region and left lower border; no diastolic murmur.  No rubs, thrills or heaves Abdomen: Marked central adiposity;soft, nontender; no hepatosplenomehaly, BS+; abdominal aorta nontender and not dilated by palpation. Back: No CVA tenderness Pulses 2+ Extremities: Trace LE edema; no clubbing cyanosis, Homan's sign negative  Neurologic: grossly nonfocal; no weakness. Psychological: Normal affect and mood.    LABS:  BMET    Component Value Date/Time   NA 134* 04/04/2013 0520   K 4.2 04/04/2013 0520   CL 98 04/04/2013 0520   CO2 22 04/04/2013 0520   GLUCOSE 141* 04/04/2013 0520   BUN 19 04/04/2013 0520   CREATININE 1.61* 04/04/2013 0520   CALCIUM 8.7 04/04/2013 0520   GFRNONAA 41* 04/04/2013 0520   GFRAA 48* 04/04/2013 0520     Hepatic Function Panel     Component Value Date/Time   PROT 5.4* 04/04/2013 0520   ALBUMIN 2.7* 04/04/2013 0520   AST 24 04/04/2013 0520  ALT 18 04/04/2013 0520    ALKPHOS 26* 04/04/2013 0520   BILITOT 0.3 04/04/2013 0520   BILIDIR <0.1 01/21/2007 0450   IBILI NOT CALCULATED 01/21/2007 0450     CBC    Component Value Date/Time   WBC 7.0 04/09/2013 0650   RBC 3.37* 04/09/2013 0650   RBC 2.67* 04/04/2013 0013   HGB 8.7* 04/09/2013 0650   HCT 27.9* 04/09/2013 0650   PLT 289 04/09/2013 0650   MCV 82.8 04/09/2013 0650   MCH 25.8* 04/09/2013 0650   MCHC 31.2 04/09/2013 0650   RDW 17.9* 04/09/2013 0650   LYMPHSABS 0.6* 04/03/2013 2245   MONOABS 0.6 04/03/2013 2245   EOSABS 0.1 04/03/2013 2245   BASOSABS 0.0 04/03/2013 2245     BNP No results found for this basename: probnp    Lipid Panel     Component Value Date/Time   CHOL  Value: 110        ATP III CLASSIFICATION:  <200     mg/dL   Desirable  200-239  mg/dL   Borderline High  >=240    mg/dL   High 01/21/2007 0450   TRIG 369* 01/21/2007 0450   HDL 21* 01/21/2007 0450   CHOLHDL 5.2 01/21/2007 0450   VLDL 74* 01/21/2007 0450   LDLCALC  Value: 15        Total Cholesterol/HDL:CHD Risk Coronary Heart Disease Risk Table                     Men   Women  1/2 Average Risk   3.4   3.3 01/21/2007 0450     RADIOLOGY: No results found.   ASSESSMENT AND PLAN: [name] underwent his followup sleep study which again confirmed very severe obstructive sleep apnea. He was titrated up to a CPAP pressure of 16 cm water pressure.  He now has a new [employer] air since 10 AutoSet machine and one month download demonstrates excellent compliance and excellent benefit with an AHI of 1.7, with utilization of a [employer] air fullface mask.  He denies any breakthrough snoring.  He denies any residual daytime sleepiness.  There is no bruxism.  He denies restless legs.  Previously, when he was without his machine he was unable to sleep well, had significant residual daytimes sleepiness and loud snoring with prior evidence for significant nocturnal oxygen desaturation. He now feels somewhat improved with restoration of CPAP therapy.  His  recent echo Doppler study which shows moderate concentric left ventricular hypertrophy and normal systolic function. He was bradycardic during the study making diastolic function evaluation less specific. There is evidence for aortic sclerosis without stenosis. His peripheral edema has improved with discontinuing his amlodipine. He now is on carvedilol 18.75 mg twice a day. He does have mixed hyperlipidemia and is on fenofibrate for his marked hypertriglyceridemia.  In light of his recent bleeding.  History and the fact that he is on warfarin.  I recommended discontinuance of his aspirin.  I also have recommended heating wire tubing which would help his humodification issues.   I commended him on his weight loss of 44 pounds since his last office visit and hopefully additional weight loss will occur.  I will see him in 6 months for cardiology reevaluation.     Troy Sine, MD, [facility] 05/24/2013 11:03 AM

## 2013-05-25 DIAGNOSIS — N3040 Irradiation cystitis without hematuria: Secondary | ICD-10-CM | POA: Diagnosis not present

## 2013-05-25 LAB — GLUCOSE, CAPILLARY
GLUCOSE-CAPILLARY: 123 mg/dL — AB (ref 70–99)
Glucose-Capillary: 190 mg/dL — ABNORMAL HIGH (ref 70–99)

## 2013-05-26 DIAGNOSIS — N3040 Irradiation cystitis without hematuria: Secondary | ICD-10-CM | POA: Diagnosis not present

## 2013-05-27 DIAGNOSIS — N3040 Irradiation cystitis without hematuria: Secondary | ICD-10-CM | POA: Diagnosis not present

## 2013-05-27 LAB — GLUCOSE, CAPILLARY
GLUCOSE-CAPILLARY: 149 mg/dL — AB (ref 70–99)
GLUCOSE-CAPILLARY: 124 mg/dL — AB (ref 70–99)
Glucose-Capillary: 192 mg/dL — ABNORMAL HIGH (ref 70–99)
Glucose-Capillary: 131 mg/dL — ABNORMAL HIGH (ref 70–99)

## 2013-05-28 DIAGNOSIS — N3040 Irradiation cystitis without hematuria: Secondary | ICD-10-CM | POA: Diagnosis not present

## 2013-05-29 DIAGNOSIS — N3040 Irradiation cystitis without hematuria: Secondary | ICD-10-CM | POA: Diagnosis not present

## 2013-05-29 LAB — GLUCOSE, CAPILLARY
GLUCOSE-CAPILLARY: 160 mg/dL — AB (ref 70–99)
Glucose-Capillary: 141 mg/dL — ABNORMAL HIGH (ref 70–99)

## 2013-06-01 DIAGNOSIS — N3040 Irradiation cystitis without hematuria: Secondary | ICD-10-CM | POA: Diagnosis not present

## 2013-06-01 LAB — GLUCOSE, CAPILLARY
GLUCOSE-CAPILLARY: 176 mg/dL — AB (ref 70–99)
GLUCOSE-CAPILLARY: 145 mg/dL — AB (ref 70–99)

## 2013-06-02 DIAGNOSIS — N3040 Irradiation cystitis without hematuria: Secondary | ICD-10-CM | POA: Diagnosis not present

## 2013-06-02 LAB — GLUCOSE, CAPILLARY
GLUCOSE-CAPILLARY: 182 mg/dL — AB (ref 70–99)
GLUCOSE-CAPILLARY: 133 mg/dL — AB (ref 70–99)
Glucose-Capillary: 175 mg/dL — ABNORMAL HIGH (ref 70–99)
Glucose-Capillary: 165 mg/dL — ABNORMAL HIGH (ref 70–99)

## 2013-06-03 DIAGNOSIS — N3040 Irradiation cystitis without hematuria: Secondary | ICD-10-CM | POA: Diagnosis not present

## 2013-06-04 DIAGNOSIS — N3040 Irradiation cystitis without hematuria: Secondary | ICD-10-CM | POA: Diagnosis not present

## 2013-06-04 LAB — GLUCOSE, CAPILLARY
GLUCOSE-CAPILLARY: 171 mg/dL — AB (ref 70–99)
Glucose-Capillary: 111 mg/dL — ABNORMAL HIGH (ref 70–99)

## 2013-06-05 ENCOUNTER — Encounter (HOSPITAL_BASED_OUTPATIENT_CLINIC_OR_DEPARTMENT_OTHER): Payer: BC Managed Care – PPO | Attending: General Surgery

## 2013-06-05 DIAGNOSIS — N3040 Irradiation cystitis without hematuria: Secondary | ICD-10-CM | POA: Diagnosis present

## 2013-06-05 DIAGNOSIS — Y842 Radiological procedure and radiotherapy as the cause of abnormal reaction of the patient, or of later complication, without mention of misadventure at the time of the procedure: Secondary | ICD-10-CM | POA: Insufficient documentation

## 2013-06-05 DIAGNOSIS — E119 Type 2 diabetes mellitus without complications: Secondary | ICD-10-CM | POA: Diagnosis not present

## 2013-06-05 DIAGNOSIS — T66XXXS Radiation sickness, unspecified, sequela: Secondary | ICD-10-CM | POA: Insufficient documentation

## 2013-06-05 LAB — GLUCOSE, CAPILLARY
GLUCOSE-CAPILLARY: 150 mg/dL — AB (ref 70–99)
Glucose-Capillary: 168 mg/dL — ABNORMAL HIGH (ref 70–99)

## 2013-06-08 DIAGNOSIS — N3040 Irradiation cystitis without hematuria: Secondary | ICD-10-CM | POA: Diagnosis not present

## 2013-06-08 LAB — GLUCOSE, CAPILLARY
Glucose-Capillary: 173 mg/dL — ABNORMAL HIGH (ref 70–99)
Glucose-Capillary: 123 mg/dL — ABNORMAL HIGH (ref 70–99)

## 2013-06-09 DIAGNOSIS — N3040 Irradiation cystitis without hematuria: Secondary | ICD-10-CM | POA: Diagnosis not present

## 2013-06-09 LAB — GLUCOSE, CAPILLARY
GLUCOSE-CAPILLARY: 179 mg/dL — AB (ref 70–99)
Glucose-Capillary: 121 mg/dL — ABNORMAL HIGH (ref 70–99)

## 2013-06-10 LAB — GLUCOSE, CAPILLARY
GLUCOSE-CAPILLARY: 132 mg/dL — AB (ref 70–99)
GLUCOSE-CAPILLARY: 117 mg/dL — AB (ref 70–99)

## 2013-06-11 DIAGNOSIS — N3040 Irradiation cystitis without hematuria: Secondary | ICD-10-CM | POA: Diagnosis not present

## 2013-06-12 DIAGNOSIS — N3040 Irradiation cystitis without hematuria: Secondary | ICD-10-CM | POA: Diagnosis not present

## 2013-06-12 LAB — GLUCOSE, CAPILLARY
GLUCOSE-CAPILLARY: 127 mg/dL — AB (ref 70–99)
Glucose-Capillary: 183 mg/dL — ABNORMAL HIGH (ref 70–99)
Glucose-Capillary: 147 mg/dL — ABNORMAL HIGH (ref 70–99)
Glucose-Capillary: 154 mg/dL — ABNORMAL HIGH (ref 70–99)

## 2013-06-15 DIAGNOSIS — N3040 Irradiation cystitis without hematuria: Secondary | ICD-10-CM | POA: Diagnosis not present

## 2013-06-15 LAB — GLUCOSE, CAPILLARY
Glucose-Capillary: 176 mg/dL — ABNORMAL HIGH (ref 70–99)
Glucose-Capillary: 115 mg/dL — ABNORMAL HIGH (ref 70–99)

## 2013-06-16 DIAGNOSIS — N3040 Irradiation cystitis without hematuria: Secondary | ICD-10-CM | POA: Diagnosis not present

## 2013-06-16 LAB — GLUCOSE, CAPILLARY
GLUCOSE-CAPILLARY: 151 mg/dL — AB (ref 70–99)
GLUCOSE-CAPILLARY: 128 mg/dL — AB (ref 70–99)

## 2013-06-17 DIAGNOSIS — N3040 Irradiation cystitis without hematuria: Secondary | ICD-10-CM | POA: Diagnosis not present

## 2013-06-17 LAB — GLUCOSE, CAPILLARY
GLUCOSE-CAPILLARY: 160 mg/dL — AB (ref 70–99)
Glucose-Capillary: 139 mg/dL — ABNORMAL HIGH (ref 70–99)

## 2013-06-18 DIAGNOSIS — N3040 Irradiation cystitis without hematuria: Secondary | ICD-10-CM | POA: Diagnosis not present

## 2013-06-18 LAB — GLUCOSE, CAPILLARY
Glucose-Capillary: 120 mg/dL — ABNORMAL HIGH (ref 70–99)
Glucose-Capillary: 98 mg/dL (ref 70–99)

## 2013-06-19 DIAGNOSIS — N3040 Irradiation cystitis without hematuria: Secondary | ICD-10-CM | POA: Diagnosis not present

## 2013-06-19 LAB — GLUCOSE, CAPILLARY
GLUCOSE-CAPILLARY: 148 mg/dL — AB (ref 70–99)
Glucose-Capillary: 119 mg/dL — ABNORMAL HIGH (ref 70–99)

## 2013-07-01 ENCOUNTER — Encounter: Payer: Self-pay | Admitting: Cardiovascular Disease

## 2013-07-20 ENCOUNTER — Ambulatory Visit: Payer: BC Managed Care – PPO | Admitting: Physical Therapy

## 2013-08-05 ENCOUNTER — Ambulatory Visit
Admission: RE | Admit: 2013-08-05 | Discharge: 2013-08-05 | Disposition: A | Discharge status: Home / Self Care | Payer: BC Managed Care – PPO | Source: Ambulatory Visit | Attending: Chiropractic Medicine | Admitting: Chiropractic Medicine

## 2013-08-05 ENCOUNTER — Other Ambulatory Visit: Payer: Self-pay | Admitting: Chiropractic Medicine

## 2013-08-05 DIAGNOSIS — M792 Neuralgia and neuritis, unspecified: Secondary | ICD-10-CM

## 2013-09-10 ENCOUNTER — Other Ambulatory Visit: Payer: Self-pay | Admitting: *Deleted

## 2013-09-10 MED ORDER — FUROSEMIDE 40 MG PO TABS: 40.0000 mg | ORAL_TABLET | Freq: Every day | ORAL | Status: DC

## 2013-10-05 ENCOUNTER — Other Ambulatory Visit: Payer: Self-pay | Admitting: *Deleted

## 2013-10-05 MED ORDER — POTASSIUM CHLORIDE CRYS ER 10 MEQ PO TBCR: 10.0000 meq | EXTENDED_RELEASE_TABLET | Freq: Every day | ORAL | Status: DC

## 2013-10-05 NOTE — Telephone Encounter (Signed)
Rx was sent to pharmacy electronically. 

## 2014-02-01 ENCOUNTER — Other Ambulatory Visit: Payer: Self-pay

## 2014-02-01 MED ORDER — POTASSIUM CHLORIDE CRYS ER 10 MEQ PO TBCR: 10.0000 meq | EXTENDED_RELEASE_TABLET | Freq: Every day | ORAL | Status: DC

## 2014-02-01 NOTE — Telephone Encounter (Signed)
Rx sent to pharmacy   

## 2014-02-10 ENCOUNTER — Other Ambulatory Visit: Payer: Self-pay | Admitting: *Deleted

## 2014-02-10 MED ORDER — CARVEDILOL 3.125 MG PO TABS: 3.1250 mg | ORAL_TABLET | Freq: Two times a day (BID) | ORAL | Status: DC

## 2014-02-10 NOTE — Telephone Encounter (Signed)
Pt. Need's appt. For future refills.

## 2014-03-15 ENCOUNTER — Ambulatory Visit
Admission: RE | Admit: 2014-03-15 | Discharge: 2014-03-15 | Disposition: A | Discharge status: Home / Self Care | Payer: BLUE CROSS/BLUE SHIELD | Source: Ambulatory Visit | Attending: Family Medicine | Admitting: Family Medicine

## 2014-03-15 ENCOUNTER — Other Ambulatory Visit: Payer: Self-pay | Admitting: Family Medicine

## 2014-03-15 ENCOUNTER — Other Ambulatory Visit: Payer: Self-pay

## 2014-03-15 DIAGNOSIS — M25562 Pain in left knee: Secondary | ICD-10-CM

## 2014-03-15 DIAGNOSIS — R1011 Right upper quadrant pain: Secondary | ICD-10-CM

## 2014-03-15 DIAGNOSIS — M25512 Pain in left shoulder: Secondary | ICD-10-CM

## 2014-03-15 MED ORDER — POTASSIUM CHLORIDE CRYS ER 10 MEQ PO TBCR: 10.0000 meq | EXTENDED_RELEASE_TABLET | Freq: Every day | ORAL | Status: DC

## 2014-03-15 MED ORDER — CARVEDILOL 3.125 MG PO TABS: 3.1250 mg | ORAL_TABLET | Freq: Two times a day (BID) | ORAL | Status: DC

## 2014-03-15 NOTE — Telephone Encounter (Signed)
Rx(s) sent to pharmacy electronically.  

## 2014-03-19 ENCOUNTER — Ambulatory Visit
Admission: RE | Admit: 2014-03-19 | Discharge: 2014-03-19 | Disposition: A | Discharge status: Home / Self Care | Payer: BLUE CROSS/BLUE SHIELD | Source: Ambulatory Visit | Attending: Family Medicine | Admitting: Family Medicine

## 2014-03-19 ENCOUNTER — Other Ambulatory Visit: Payer: Self-pay | Admitting: Family Medicine

## 2014-03-19 DIAGNOSIS — M25512 Pain in left shoulder: Secondary | ICD-10-CM

## 2014-03-29 ENCOUNTER — Other Ambulatory Visit: Payer: Self-pay

## 2014-03-29 MED ORDER — CARVEDILOL 3.125 MG PO TABS: 3.1250 mg | ORAL_TABLET | Freq: Two times a day (BID) | ORAL | Status: DC

## 2014-03-29 MED ORDER — POTASSIUM CHLORIDE CRYS ER 10 MEQ PO TBCR: 10.0000 meq | EXTENDED_RELEASE_TABLET | Freq: Every day | ORAL | Status: DC

## 2014-03-29 NOTE — Telephone Encounter (Signed)
Rx(s) sent to pharmacy electronically.  

## 2014-06-02 ENCOUNTER — Other Ambulatory Visit: Payer: Self-pay

## 2014-06-02 MED ORDER — FUROSEMIDE 40 MG PO TABS: 40.0000 mg | ORAL_TABLET | Freq: Every day | ORAL | Status: DC

## 2014-06-02 MED ORDER — FERROUS SULFATE 325 (65 FE) MG PO TBEC: 325.0000 mg | DELAYED_RELEASE_TABLET | Freq: Every day | ORAL | Status: DC

## 2014-06-02 MED ORDER — CARVEDILOL 3.125 MG PO TABS: 3.1250 mg | ORAL_TABLET | Freq: Two times a day (BID) | ORAL | Status: DC

## 2014-06-02 NOTE — Telephone Encounter (Signed)
Rx(s) sent to pharmacy electronically. Needs "cardiology reevaluation in 6 months." (from March sleep appointment with TK)

## 2014-06-10 ENCOUNTER — Other Ambulatory Visit: Payer: Self-pay

## 2014-06-10 MED ORDER — POTASSIUM CHLORIDE CRYS ER 10 MEQ PO TBCR: 10.0000 meq | EXTENDED_RELEASE_TABLET | Freq: Every day | ORAL | Status: DC

## 2014-06-10 NOTE — Telephone Encounter (Signed)
Rx(s) sent to pharmacy electronically.  

## 2014-08-02 ENCOUNTER — Other Ambulatory Visit: Payer: Self-pay

## 2014-08-06 ENCOUNTER — Telehealth: Payer: Self-pay | Admitting: *Deleted

## 2014-08-06 NOTE — Telephone Encounter (Signed)
Faxed CPAP supply order to [facility]. 

## 2014-09-20 ENCOUNTER — Other Ambulatory Visit: Payer: Self-pay | Admitting: *Deleted

## 2014-09-20 MED ORDER — POTASSIUM CHLORIDE CRYS ER 10 MEQ PO TBCR
10.0000 meq | EXTENDED_RELEASE_TABLET | Freq: Every day | ORAL | Status: DC
Start: 2014-09-20 — End: 2014-11-21

## 2014-11-09 ENCOUNTER — Encounter: Payer: Self-pay | Admitting: Cardiovascular Disease

## 2014-11-09 ENCOUNTER — Ambulatory Visit (INDEPENDENT_AMBULATORY_CARE_PROVIDER_SITE_OTHER): Payer: BLUE CROSS/BLUE SHIELD | Admitting: Cardiovascular Disease

## 2014-11-09 VITALS — BP 112/78 | HR 60 | Ht 70.0 in | Wt 265.2 lb

## 2014-11-09 DIAGNOSIS — Z86711 Personal history of pulmonary embolism: Secondary | ICD-10-CM | POA: Diagnosis not present

## 2014-11-09 DIAGNOSIS — Z905 Acquired absence of kidney: Secondary | ICD-10-CM

## 2014-11-09 DIAGNOSIS — I481 Persistent atrial fibrillation: Secondary | ICD-10-CM | POA: Diagnosis not present

## 2014-11-09 DIAGNOSIS — I4891 Unspecified atrial fibrillation: Secondary | ICD-10-CM | POA: Diagnosis not present

## 2014-11-09 DIAGNOSIS — I4819 Other persistent atrial fibrillation: Secondary | ICD-10-CM

## 2014-11-09 DIAGNOSIS — E669 Obesity, unspecified: Secondary | ICD-10-CM

## 2014-11-09 DIAGNOSIS — Z7901 Long term (current) use of anticoagulants: Secondary | ICD-10-CM

## 2014-11-09 NOTE — Patient Instructions (Signed)
Your physician has requested that you have an echocardiogram. Echocardiography is a painless test that uses sound waves to create images of your heart. It provides your doctor with information about the size and shape of your heart and how well your heart's chambers and valves are working. This procedure takes approximately one hour. There are no restrictions for this procedure.   Your physician wants you to follow-up in: 6 months or sooner if needed. You will receive a reminder letter in the mail two months in advance. If you don't receive a letter, please call our office to schedule the follow-up appointment. 

## 2014-11-10 ENCOUNTER — Encounter: Payer: Self-pay | Admitting: Cardiovascular Disease

## 2014-11-10 DIAGNOSIS — I4891 Unspecified atrial fibrillation: Secondary | ICD-10-CM | POA: Insufficient documentation

## 2014-11-10 DIAGNOSIS — E669 Obesity, unspecified: Secondary | ICD-10-CM | POA: Insufficient documentation

## 2014-11-10 NOTE — Progress Notes (Signed)
Patient ID: KOTY ANCTIL, male   DOB: 1941-01-31, 74 y.o.   MRN: [id number]      HPI:  Mr. Kenric Ginger is a 74 year old former patient of Dr. Terance Ice who establish cardiology care with me in October 2014.  I last saw him in March 2015.  He presents for an 18 month follow-up evaluation   Mr. Hicklin has a complex medical history including severe exogenous obesity, venous insufficiency, history of prostate cancer and he is also status post left nephrectomy. He has had difficulty with hypertension. He has had significant problems with lower extremity edema and has documented venous insufficiency of both his right and left superficial saphenous veins. He has a remote history of DVT/PE and has been on anticoagulation therapy with Coumadin.  In 2009 he was referred for a sleep study which revealed severe obstructive sleep apnea with an AHI on the baseline portion of a split night protocol in excess of 100. Since 2009 he has been using CPAP therapy and has been on a 14 cm water pressure. Apparently, in September his machine no longer functioned. Within  a 10 day period without the unit he could not sleep well. He had a canceled numerous appointments due to marked residual daytime sleepiness and the sensation that he would fall asleep while driving. He was given a Metallurgist by [facility] since his prior DME [employer] SMS is no longer available. He was given a CPAP although unit and initial review of this short download indicates that this has only been titrated up to 12 cm and he had a residual AHI improved at 2.9. When I saw him in October, I referred him for a followup sleep study prior to obtaining a newly seen. The sleep study was done as a split night protocol on 12/26/2012. This again confirms severe obstructive sleep apnea with an HI overall of 84.9 per hour. He was unable to achieve REM sleep on the baseline portion of the study. Oxygen saturation dropped to 83% with non-REM sleep. He was  loud snoring. CPAP therapy was instituted and was titrated up to 16 cm water pressure which was the recommended pressure. He will be getting a new machine and will be referred to [facility] for this since they have provided him with the loaner unit prior to his most recent study.  In the past he has had progressive lower extremity swelling.  I discontinued his amlodipine which was undoubtedly playing a contributory role in his previous 3-4+ lower edema. His last echo Doppler study was in 2012 which showed moderate concentric left ventricular hypertrophy with an ejection fraction of greater than 55%. He did have mild pulmonary hypertension with estimated RV systolic pressure 39 mm. He did have aortic sclerosis without stenosis, trace mitral regurgitation, and trace tricuspid regurgitation. One is mostly consistent echo Doppler study of 12/16/2012 ejection fraction was 55-60%. There was mild mitral regurgitation, aortic sclerosis without stenosis. Estimated RV systolic pressure was normal at 23 mm.  Mr. Earlean Polka was in the hospital from  January 26 - February 7 and then again from February 27 through April 09, 2013 with urologic bleeding issues.   Last year, Mr. Luckadoo obtained a new CPAP machine, ResMed AirSense 10 AutoSet model set at16 cm water pressure.  Review of download from 03/21/2013 through 04/19/2013 reveals a very good compliance with 97% of the days used, and 93% with days with use > 4 hours.  AHI is 1.7.  He is using an Water engineer mask  and is tolerating this well without significant leak.  Over the past year, Mr. Arbie Cookey denies episodes of chest pain.  He is unaware of any rhythm disturbance.  He tells me he has had blood work done by his primary physician, Dr. Harrington Challenger.  He admits to significant weight loss over the past 2 years with a peak weight of 354 in May 2014 to at present weight of 265 giving him almost a 90 pound weight loss.  He presents for evaluation.  Past Medical History  Diagnosis  Date  . Obesity   . Phlebitis     Lower extermity  . Pulmonary emboli ([place]) 2008    submassive, saddle  . Prostate cancer ([place]) 07/2009  . Sleep apnea     on CPAP  . Hx of echocardiogram 12/04/2010    Normal EF >55% no significant valve disease  . History of stress test 06/27/2009    Low risk and EF of approximately 50%  . DVT (deep venous thrombosis) ([place])   . Diabetes mellitus ([place])   . Chronic kidney disease, stage 3     baseline creatinine ~1.4  . HLD (hyperlipidemia)   . HTN (hypertension)     Past Surgical History  Procedure Laterality Date  . Nephrectomy  1999    for CA  . Cholecystectomy  1999  . Transurethral resection of bladder tumor N/A 03/04/2013    Procedure: CYSTOSCOPY WITH RIGHT RETROGRADE PYELOGRAM AND BLADDER BIOPSY /CLOT EVACUATION/ BIOPSY PROSTATIC URETHRA WITH FULGERATION ;  Surgeon: Molli Hazard, MD;  Location: [facility];  Service: Urology;  Laterality: N/A;  . Cystoscopy w/ retrogrades N/A 04/08/2013    Procedure: CYSTOSCOPY WITH CLOT EVACUATION;  Surgeon: Molli Hazard, MD;  Location: [facility];  Service: Urology;  Laterality: N/A;    No Known Allergies  Current Outpatient Prescriptions  Medication Sig Dispense Refill  . alendronate (FOSAMAX) 70 MG tablet Take 1 tablet by mouth every Monday.     . carvedilol (COREG) 3.125 MG tablet Take 1 tablet (3.125 mg total) by mouth 2 (two) times daily with a meal. 60 tablet 4  . fenofibrate (TRICOR) 145 MG tablet Take 145 mg by mouth daily.     . ferrous sulfate 325 (65 FE) MG EC tablet Take 1 tablet (325 mg total) by mouth daily. 30 tablet 4  . furosemide (LASIX) 40 MG tablet Take 1 tablet (40 mg total) by mouth daily. 30 tablet 4  . Niacin CR 1000 MG TBCR Take 1,000 mg by mouth at bedtime.     . quinapril (ACCUPRIL) 20 MG tablet Take 20 mg by mouth daily.   0  . silver sulfADIAZINE (SILVADENE) 1 % cream Apply 1 application topically daily.    Marland Kitchen warfarin (COUMADIN) 5 MG tablet Take 5-7.5 mg by mouth  daily. 5 mg on Monday, Wednesday, and Friday.  Take 7.55 mg on Tuesday, Thursday, Saturday, and Sunday.     No current facility-administered medications for this visit.    Social history is notable in that he is married. He works as a Freight forwarder in [employer]. He does   Travel for his job. He completed 12th grade. There is no recent tobacco or alcohol use.  Family History  Problem Relation Age of Onset  . Cancer Mother   . Diabetes Father   . Cancer Father    ROS General: Negative; No fevers, chills, or night sweats; positive for purposeful weight loss over 90 pounds over the last 2 years HEENT: Negative;  No changes in vision or hearing, sinus congestion, difficulty swallowing Pulmonary: Negative; No cough, wheezing, shortness of breath, hemoptysis Cardiovascular: Negative; No chest pain, presyncope, syncope, palpitations Positive for venous insufficiency GI: Negative; No nausea, vomiting, diarrhea, or abdominal pain GU: Negative; No dysuria, hematuria, or difficulty voiding Musculoskeletal: Negative; no myalgias, joint pain, or weakness Hematologic/Oncology: Negative; no easy bruising, bleeding Endocrine: Negative; no heat/cold intolerance; no diabetes Neuro: Negative; no changes in balance, headaches Skin: Negative; No rashes or skin lesions Psychiatric: Negative; No behavioral problems, depression Sleep: Positive for obstructive sleep apnea on CPAP.  No residual snoring, daytime sleepiness, hypersomnolence, bruxism, restless legs, hypnogognic hallucinations, no cataplexy Other comprehensive 14 point system review is negative.   PE BP 112/78 mmHg  Pulse 60  Ht _0  (1.778 m)  Wt 265 lb 3.2 oz (120.294 kg)  BMI 38.05 kg/m2  Wt Readings from Last 3 Encounters:  11/09/14 265 lb 3.2 oz (120.294 kg)  04/23/13 291 lb 3.2 oz (132.087 kg)  04/07/13 297 lb 2.9 oz (134.8 kg)   General: Alert, oriented, no distress; morbidly obese  Skin: normal turgor, no  rashes HEENT: Normocephalic, atraumatic. Pupils round and reactive; sclera anicteric; Fundi arteriolar narrowing. Nose without nasal septal hypertrophy Mouth/Parynx benign; Mallinpatti scale 3/4 Neck: No JVD, no carotid bruits; normal upstroke Chest: Nontender to palpation Lungs: clear to ausculatation and percussion; no wheezing or rales Heart: RRR, s1 s2 normal 2/6 systolic murmur in the aortic region and left lower border; no diastolic murmur.  No rubs, thrills or heaves Abdomen: Marked central adiposity;soft, nontender; no hepatosplenomehaly, BS+; abdominal aorta nontender and not dilated by palpation. Back: No CVA tenderness Pulses 2+ Extremities: Trace LE edema; no clubbing cyanosis, Homan's sign negative  Neurologic: grossly nonfocal; no weakness. Psychological: Normal affect and mood.  ECG (independently read by me): Atrial fibrillation at 60 bpm.  QTc interval 422 ms.  I reviewed an old ECG from 03/02/2013 revealed sinus rhythm.Marland Kitchen  LABS: BMP Latest Ref Rng 04/04/2013 04/03/2013 03/16/2013  Glucose 70 - 99 mg/dL 141(H) 173(H) 139(H)  BUN 6 - 23 mg/dL 19 20 31(H)  Creatinine 0.50 - 1.35 mg/dL 1.61(H) 1.89(H) 1.70(H)  Sodium 137 - 147 mEq/L 134(L) 132(L) 138  Potassium 3.7 - 5.3 mEq/L 4.2 4.4 4.3  Chloride 96 - 112 mEq/L 98 95(L) 104  CO2 19 - 32 mEq/L 22 21 -  Calcium 8.4 - 10.5 mg/dL 8.7 8.9 -   Hepatic Function Latest Ref Rng 04/04/2013 07/17/2009 01/21/2007  Total Protein 6.0 - 8.3 g/dL 5.4(L) 6.1 5.9(L)  Albumin 3.5 - 5.2 g/dL 2.7(L) 3.3(L) 3.3(L)  AST 0 - 37 U/L _1 ALT 0 - 53 U/L 18 25 35  Alk Phosphatase 39 - 117 U/L 26(L) 30(L) 35(L)  Total Bilirubin 0.3 - 1.2 mg/dL 0.3 1.1 0.8  Bilirubin, Direct - - - <0.1   CBC Latest Ref Rng 04/09/2013 04/08/2013 04/08/2013  WBC 4.0 - 10.5 K/uL 7.0 7.5 6.2  Hemoglobin 13.0 - 17.0 g/dL 8.7(L) 9.3(L) 8.7(L)  Hematocrit 39.0 - 52.0 % 27.9(L) 29.4(L) 27.6(L)  Platelets 150 - 400 K/uL 289 294 314   Lab Results  Component Value Date    MCV 82.8 04/09/2013   MCV 81.9 04/08/2013   MCV 81.7 04/08/2013   Lab Results  Component Value Date   TSH 1.316 04/04/2013   Lab Results  Component Value Date   HGBA1C * 01/21/2007    7.6 (NOTE)   The ADA recommends the following therapeutic goals for glycemic  control related to Hgb A1C measurement:   Goal of Therapy:   < 7.0% Hgb A1C   Action Suggested:  > 8.0% Hgb A1C   Ref:  Diabetes Care, 22, Suppl. 1, 1999   Lipid Panel     Component Value Date/Time   CHOL  01/21/2007 0450    110        ATP III CLASSIFICATION:  <200     mg/dL   Desirable  200-239  mg/dL   Borderline High  >=240    mg/dL   High   TRIG 369* 01/21/2007 0450   HDL 21* 01/21/2007 0450   CHOLHDL 5.2 01/21/2007 0450   VLDL 74* 01/21/2007 0450   LDLCALC  01/21/2007 0450    15        Total Cholesterol/HDL:CHD Risk Coronary Heart Disease Risk Table                     Men   Women  1/2 Average Risk   3.4   3.3   RADIOLOGY: No results found.   ASSESSMENT AND PLAN: Mr. Cosme Jacob is a 74 year old male with a prior history of severe morbid obesity who has lost over 90 pounds over the past 2 years.  He has history of hypertension, venous insufficiency, prostate CA and is status post left nephrectomy.  He also has very severe obstructive sleep apnea  on CPAP therapy with a  16 cm water pressure.  He received a new ResMed AirSense 10 AutoSet machine last year and continues to tolerate this well.  They to 100% compliance.  He is on chronic anticoagulation therapy due to prior history of DVT and PE.  His ECG today shows atrial fibrillation.  He has had episodes of PAF in the past.  However, an ECG from January 2015 had shown sinus rhythm.  He is unaware of his atrial arrhythmia.  His ventricular rate is controlled and he is on anticoagulation therapy.  His last echo Doppler study was in 2012.  I am recommending that he undergo a follow-up echo Doppler study.  I suspect his atrial fibrillation is permanent.  I will try  to obtain blood work which he had done by Dr. Harrington Challenger.  He currently is asymptomatic with reference to his atrial fibrillation.  His blood pressure today is controlled on Accupril 20 mg furosemide 40 mg as well as carvedilol 3.125 mg twice a day.  He is on niacin and fenofibrate for his mixed hyperlipidemia.  Dr. Harrington Challenger has been following his INR with reference to his Coumadin dosing.  I will contact him regarding his echo results.  I will see him in the office for 6 months for follow-up evaluation.  Time spent: 30 minutes    Troy Sine, MD, [facility] 11/10/2014 7:59 AM

## 2014-11-12 ENCOUNTER — Other Ambulatory Visit: Payer: Self-pay | Admitting: Gastroenterology

## 2014-11-13 ENCOUNTER — Other Ambulatory Visit: Payer: Self-pay | Admitting: Cardiovascular Disease

## 2014-11-15 NOTE — Telephone Encounter (Signed)
REFILL 

## 2014-11-17 ENCOUNTER — Other Ambulatory Visit: Payer: Self-pay

## 2014-11-17 ENCOUNTER — Ambulatory Visit (HOSPITAL_COMMUNITY): Payer: BLUE CROSS/BLUE SHIELD | Attending: Cardiovascular Disease

## 2014-11-17 ENCOUNTER — Other Ambulatory Visit (HOSPITAL_COMMUNITY): Payer: BLUE CROSS/BLUE SHIELD

## 2014-11-17 DIAGNOSIS — I10 Essential (primary) hypertension: Secondary | ICD-10-CM | POA: Insufficient documentation

## 2014-11-17 DIAGNOSIS — I517 Cardiomegaly: Secondary | ICD-10-CM | POA: Diagnosis not present

## 2014-11-17 DIAGNOSIS — I351 Nonrheumatic aortic (valve) insufficiency: Secondary | ICD-10-CM | POA: Insufficient documentation

## 2014-11-17 DIAGNOSIS — I4891 Unspecified atrial fibrillation: Secondary | ICD-10-CM | POA: Insufficient documentation

## 2014-11-21 ENCOUNTER — Other Ambulatory Visit: Payer: Self-pay | Admitting: Cardiovascular Disease

## 2014-11-22 NOTE — Telephone Encounter (Signed)
Rx request sent to pharmacy.  

## 2015-01-08 ENCOUNTER — Other Ambulatory Visit: Payer: Self-pay | Admitting: Cardiovascular Disease

## 2015-01-10 NOTE — Telephone Encounter (Signed)
REFILL 

## 2015-01-15 ENCOUNTER — Other Ambulatory Visit: Payer: Self-pay | Admitting: Cardiovascular Disease

## 2015-01-17 NOTE — Telephone Encounter (Signed)
REFILL 

## 2015-01-24 ENCOUNTER — Other Ambulatory Visit: Payer: Self-pay | Admitting: Cardiovascular Disease

## 2015-04-21 ENCOUNTER — Telehealth: Payer: Self-pay | Admitting: Cardiovascular Disease

## 2015-04-21 NOTE — Telephone Encounter (Signed)
Patient stopped in with a medical clearance letter that he needs filled out by Dr. Claiborne Billings for upcoming left shoulder rotator cuff repair surgery.

## 2015-04-22 ENCOUNTER — Telehealth: Payer: Self-pay | Admitting: Cardiovascular Disease

## 2015-04-22 NOTE — Telephone Encounter (Signed)
New message      Talk to [name] regarding a letter he dropped off yesterday

## 2015-04-25 ENCOUNTER — Other Ambulatory Visit: Payer: Self-pay | Admitting: *Deleted

## 2015-04-25 DIAGNOSIS — I4891 Unspecified atrial fibrillation: Secondary | ICD-10-CM

## 2015-04-25 DIAGNOSIS — I10 Essential (primary) hypertension: Secondary | ICD-10-CM

## 2015-04-25 DIAGNOSIS — Z01818 Encounter for other preprocedural examination: Secondary | ICD-10-CM

## 2015-04-27 ENCOUNTER — Telehealth (HOSPITAL_COMMUNITY): Payer: Self-pay

## 2015-04-27 NOTE — Telephone Encounter (Signed)
Encounter complete. 

## 2015-04-28 ENCOUNTER — Other Ambulatory Visit: Payer: Self-pay | Admitting: Surgical

## 2015-04-29 ENCOUNTER — Ambulatory Visit (HOSPITAL_COMMUNITY)
Admission: RE | Admit: 2015-04-29 | Discharge: 2015-04-29 | Disposition: A | Discharge status: Home / Self Care | Payer: BLUE CROSS/BLUE SHIELD | Source: Ambulatory Visit | Attending: Cardiology | Admitting: Cardiology

## 2015-04-29 ENCOUNTER — Encounter (HOSPITAL_COMMUNITY): Payer: Self-pay

## 2015-04-29 ENCOUNTER — Encounter (HOSPITAL_COMMUNITY)
Admission: RE | Admit: 2015-04-29 | Discharge: 2015-04-29 | Disposition: A | Discharge status: Home / Self Care | Payer: BLUE CROSS/BLUE SHIELD | Source: Ambulatory Visit | Attending: Orthopedic Surgery | Admitting: Orthopedic Surgery

## 2015-04-29 DIAGNOSIS — I4891 Unspecified atrial fibrillation: Secondary | ICD-10-CM

## 2015-04-29 DIAGNOSIS — G4733 Obstructive sleep apnea (adult) (pediatric): Secondary | ICD-10-CM | POA: Insufficient documentation

## 2015-04-29 DIAGNOSIS — E669 Obesity, unspecified: Secondary | ICD-10-CM | POA: Diagnosis not present

## 2015-04-29 DIAGNOSIS — N189 Chronic kidney disease, unspecified: Secondary | ICD-10-CM | POA: Diagnosis not present

## 2015-04-29 DIAGNOSIS — Z6838 Body mass index (BMI) 38.0-38.9, adult: Secondary | ICD-10-CM | POA: Diagnosis not present

## 2015-04-29 DIAGNOSIS — I129 Hypertensive chronic kidney disease with stage 1 through stage 4 chronic kidney disease, or unspecified chronic kidney disease: Secondary | ICD-10-CM | POA: Insufficient documentation

## 2015-04-29 DIAGNOSIS — I872 Venous insufficiency (chronic) (peripheral): Secondary | ICD-10-CM | POA: Insufficient documentation

## 2015-04-29 DIAGNOSIS — Z86711 Personal history of pulmonary embolism: Secondary | ICD-10-CM | POA: Insufficient documentation

## 2015-04-29 DIAGNOSIS — Z01812 Encounter for preprocedural laboratory examination: Secondary | ICD-10-CM | POA: Insufficient documentation

## 2015-04-29 DIAGNOSIS — Z01818 Encounter for other preprocedural examination: Secondary | ICD-10-CM

## 2015-04-29 DIAGNOSIS — Z0183 Encounter for blood typing: Secondary | ICD-10-CM | POA: Diagnosis not present

## 2015-04-29 DIAGNOSIS — I10 Essential (primary) hypertension: Secondary | ICD-10-CM

## 2015-04-29 HISTORY — DX: Cardiac arrhythmia, unspecified: I49.9

## 2015-04-29 HISTORY — DX: Personal history of other diseases of the digestive system: Z87.19

## 2015-04-29 LAB — CBC WITH DIFFERENTIAL/PLATELET
Basophils Absolute: 0 10*3/uL (ref 0.0–0.1)
Basophils Relative: 0 %
Eosinophils Absolute: 0.4 10*3/uL (ref 0.0–0.7)
Eosinophils Relative: 7 %
HCT: 41.2 % (ref 39.0–52.0)
Hemoglobin: 13.6 g/dL (ref 13.0–17.0)
Lymphocytes Relative: 15 %
Lymphs Abs: 0.9 10*3/uL (ref 0.7–4.0)
MCH: 27.9 pg (ref 26.0–34.0)
MCHC: 33 g/dL (ref 30.0–36.0)
MCV: 84.6 fL (ref 78.0–100.0)
Monocytes Absolute: 0.5 10*3/uL (ref 0.1–1.0)
Monocytes Relative: 8 %
Neutro Abs: 4.1 10*3/uL (ref 1.7–7.7)
Neutrophils Relative %: 70 %
Platelets: 218 10*3/uL (ref 150–400)
RBC: 4.87 MIL/uL (ref 4.22–5.81)
RDW: 15 % (ref 11.5–15.5)
WBC: 5.8 10*3/uL (ref 4.0–10.5)

## 2015-04-29 LAB — COMPREHENSIVE METABOLIC PANEL
ALT: 20 U/L (ref 17–63)
AST: 26 U/L (ref 15–41)
Albumin: 3.6 g/dL (ref 3.5–5.0)
Alkaline Phosphatase: 29 U/L — ABNORMAL LOW (ref 38–126)
Anion gap: 9 (ref 5–15)
BUN: 25 mg/dL — ABNORMAL HIGH (ref 6–20)
CO2: 26 mmol/L (ref 22–32)
Calcium: 8.9 mg/dL (ref 8.9–10.3)
Chloride: 102 mmol/L (ref 101–111)
Creatinine, Ser: 1.55 mg/dL — ABNORMAL HIGH (ref 0.61–1.24)
GFR calc Af Amer: 50 mL/min — ABNORMAL LOW (ref >60)
GFR calc non Af Amer: 43 mL/min — ABNORMAL LOW (ref >60)
Glucose, Bld: 271 mg/dL — ABNORMAL HIGH (ref 65–99)
Potassium: 4.7 mmol/L (ref 3.5–5.1)
Sodium: 137 mmol/L (ref 135–145)
Total Bilirubin: 0.7 mg/dL (ref 0.3–1.2)
Total Protein: 6.4 g/dL — ABNORMAL LOW (ref 6.5–8.1)

## 2015-04-29 LAB — MYOCARDIAL PERFUSION IMAGING
CHL CUP NUCLEAR SDS: 0
CHL CUP NUCLEAR SRS: 2
CHL CUP RESTING HR STRESS: 67 {beats}/min
Peak HR: 78 {beats}/min
SSS: 2
TID: 1.07

## 2015-04-29 LAB — PROTIME-INR
INR: 2.49 — ABNORMAL HIGH (ref 0.00–1.49)
Prothrombin Time: 25.8 seconds — ABNORMAL HIGH (ref 11.6–15.2)

## 2015-04-29 LAB — APTT: aPTT: 33 seconds (ref 24–37)

## 2015-04-29 MED ORDER — TECHNETIUM TC 99M SESTAMIBI GENERIC - CARDIOLITE
29.0000 | Freq: Once | INTRAVENOUS | Status: AC | PRN
  Administered 2015-04-29: 29 via INTRAVENOUS

## 2015-04-29 MED ORDER — REGADENOSON 0.4 MG/5ML IV SOLN
0.4000 mg | Freq: Once | INTRAVENOUS | Status: AC
  Administered 2015-04-29: 0.4 mg via INTRAVENOUS

## 2015-04-29 MED ORDER — TECHNETIUM TC 99M SESTAMIBI GENERIC - CARDIOLITE
10.8000 | Freq: Once | INTRAVENOUS | Status: AC | PRN
  Administered 2015-04-29: 11 via INTRAVENOUS

## 2015-04-29 NOTE — Patient Instructions (Addendum)
[name]  04/29/2015   Your procedure is scheduled on: 05-03-15  Report to [facility]  Entrance take [name]  elevators to 3rd floor to  [name] at 8:30AM.  Call this number if you have problems the morning of surgery [phone]   Remember: ONLY 1 PERSON MAY GO WITH YOU TO SHORT STAY TO GET  READY MORNING OF [name].  Do not eat food or drink liquids :After Midnight.     Take these medicines the morning of surgery with A SIP OF WATER: Coreg   Please bring CPAP mask and tubing with you day of surgery.                               You may not have any metal on your body including hair pins and              piercings  Do not wear jewelry, make-up, lotions, powders or perfumes, deodorant             Do not wear nail polish.  Do not shave  48 hours prior to surgery.              Men may shave face and neck.   Do not bring valuables to the [facility]. [name].  Contacts, dentures or bridgework may not be worn into surgery.  Leave suitcase in the car. After surgery it may be brought to your room.                Please read over the following fact sheets you were given: _____________________________________________________________________             [name] - Preparing for Surgery Before surgery, you can play an important role.  Because skin is not sterile, your skin needs to be as free of germs as possible.  You can reduce the number of germs on your skin by washing with CHG (chlorahexidine gluconate) soap before surgery.  CHG is an antiseptic cleaner which kills germs and bonds with the skin to continue killing germs even after washing. Please DO NOT use if you have an allergy to CHG or antibacterial soaps.  If your skin becomes reddened/irritated stop using the CHG and inform your nurse when you arrive at Short Stay. Do not shave (including legs and underarms) for at least 48 hours  prior to the first CHG shower.  You may shave your face/neck. Please follow these instructions carefully:  1.  Shower with CHG Soap the night before surgery and the  morning of Surgery.  2.  If you choose to wash your hair, wash your hair first as usual with your  normal  shampoo.  3.  After you shampoo, rinse your hair and body thoroughly to remove the  shampoo.                           4.  Use CHG as you would any other liquid soap.  You can apply chg directly  to the skin and wash                       Gently with a scrungie or  clean washcloth.  5.  Apply the CHG Soap to your body ONLY FROM THE NECK DOWN.   Do not use on face/ open                           Wound or open sores. Avoid contact with eyes, ears mouth and genitals (private parts).                       Wash face,  Genitals (private parts) with your normal soap.             6.  Wash thoroughly, paying special attention to the area where your surgery  will be performed.  7.  Thoroughly rinse your body with warm water from the neck down.  8.  DO NOT shower/wash with your normal soap after using and rinsing off  the CHG Soap.                9.  Pat yourself dry with a clean towel.            10.  Wear clean pajamas.            11.  Place clean sheets on your bed the night of your first shower and do not  sleep with pets. Day of Surgery : Do not apply any lotions/deodorants the morning of surgery.  Please wear clean clothes to the [facility]/surgery center.  FAILURE TO FOLLOW THESE INSTRUCTIONS MAY RESULT IN THE CANCELLATION OF YOUR SURGERY PATIENT SIGNATURE_________________________________  NURSE SIGNATURE__________________________________  ________________________________________________________________________   [name]  An incentive spirometer is a tool that can help keep your lungs clear and active. This tool measures how well you are filling your lungs with each breath. Taking long deep breaths may help reverse  or decrease the chance of developing breathing (pulmonary) problems (especially infection) following:  A long period of time when you are unable to move or be active. BEFORE THE PROCEDURE   If the spirometer includes an indicator to show your best effort, your nurse or respiratory therapist will set it to a desired goal.  If possible, sit up straight or lean slightly forward. Try not to slouch.  Hold the incentive spirometer in an upright position. INSTRUCTIONS FOR USE  1. Sit on the edge of your bed if possible, or sit up as far as you can in bed or on a chair. 2. Hold the incentive spirometer in an upright position. 3. Breathe out normally. 4. Place the mouthpiece in your mouth and seal your lips tightly around it. 5. Breathe in slowly and as deeply as possible, raising the piston or the ball toward the top of the column. 6. Hold your breath for 3-5 seconds or for as long as possible. Allow the piston or ball to fall to the bottom of the column. 7. Remove the mouthpiece from your mouth and breathe out normally. 8. Rest for a few seconds and repeat Steps 1 through 7 at least 10 times every 1-2 hours when you are awake. Take your time and take a few normal breaths between deep breaths. 9. The spirometer may include an indicator to show your best effort. Use the indicator as a goal to work toward during each repetition. 10. After each set of 10 deep breaths, practice coughing to be sure your lungs are clear. If you have an incision (the cut made at the time of surgery), support your incision when coughing  by placing a pillow or rolled up towels firmly against it. Once you are able to get out of bed, walk around indoors and cough well. You may stop using the incentive spirometer when instructed by your caregiver.  RISKS AND COMPLICATIONS  Take your time so you do not get dizzy or light-headed.  If you are in pain, you may need to take or ask for pain medication before doing incentive  spirometry. It is harder to take a deep breath if you are having pain. AFTER USE  Rest and breathe slowly and easily.  It can be helpful to keep track of a log of your progress. Your caregiver can provide you with a simple table to help with this. If you are using the spirometer at home, follow these instructions: [place] IF:   You are having difficultly using the spirometer.  You have trouble using the spirometer as often as instructed.  Your pain medication is not giving enough relief while using the spirometer.  You develop fever of 100.5 F (38.1 C) or higher. SEEK IMMEDIATE MEDICAL CARE IF:   You cough up bloody sputum that had not been present before.  You develop fever of 102 F (38.9 C) or greater.  You develop worsening pain at or near the incision site. MAKE SURE YOU:   Understand these instructions.  Will watch your condition.  Will get help right away if you are not doing well or get worse. Document Released: 06/04/2006 Document Revised: 04/16/2011 Document Reviewed: 08/05/2006 [facility] Patient Information 2014 [place], [place].   ________________________________________________________________________

## 2015-04-29 NOTE — Pre-Procedure Instructions (Addendum)
EKG 11-09-15 epic Echo 11-17-14 epic [facility] 11-09-14, epic Medical Clearance on Chart  Pt states last dose of Coumadin was taken 04/27/15  Pt blood glucose returned at 271 from today's pre-op visit.  Spoke with Dr. Glennon Mac. Also routed the results to Dr. Gladstone Lighter. Also spoke with Velvet at Dr. Charlestine Night office to let her know.

## 2015-04-30 LAB — HEMOGLOBIN A1C
HEMOGLOBIN A1C: 8.6 % — AB (ref 4.8–5.6)
Mean Plasma Glucose: 200 mg/dL

## 2015-05-02 ENCOUNTER — Telehealth: Payer: Self-pay | Admitting: *Deleted

## 2015-05-02 ENCOUNTER — Encounter: Payer: Self-pay | Admitting: *Deleted

## 2015-05-02 NOTE — Telephone Encounter (Signed)
Faxed surgical clearance for left shoulder rotator cuff surgery to Dr Gladstone Lighter, [place].

## 2015-05-02 NOTE — Progress Notes (Signed)
HGA1C done 04/29/2015 routed via EPIC to Dr Gladstone Lighter.

## 2015-05-03 ENCOUNTER — Encounter (HOSPITAL_COMMUNITY): Payer: Self-pay | Admitting: *Deleted

## 2015-05-03 ENCOUNTER — Encounter (HOSPITAL_COMMUNITY)
Admission: RE | Disposition: A | Discharge status: Home / Self Care | Payer: Self-pay | Source: Ambulatory Visit | Attending: Orthopedic Surgery

## 2015-05-03 ENCOUNTER — Observation Stay (HOSPITAL_COMMUNITY)
Admission: RE | Admit: 2015-05-03 | Discharge: 2015-05-04 | Disposition: A | Discharge status: Home / Self Care | Payer: BLUE CROSS/BLUE SHIELD | Source: Ambulatory Visit | Attending: Orthopedic Surgery | Admitting: Orthopedic Surgery

## 2015-05-03 ENCOUNTER — Ambulatory Visit (HOSPITAL_COMMUNITY): Payer: BLUE CROSS/BLUE SHIELD | Admitting: Anesthesiology

## 2015-05-03 DIAGNOSIS — Z7901 Long term (current) use of anticoagulants: Secondary | ICD-10-CM | POA: Diagnosis not present

## 2015-05-03 DIAGNOSIS — Z86711 Personal history of pulmonary embolism: Secondary | ICD-10-CM | POA: Insufficient documentation

## 2015-05-03 DIAGNOSIS — Z79899 Other long term (current) drug therapy: Secondary | ICD-10-CM | POA: Diagnosis not present

## 2015-05-03 DIAGNOSIS — G4730 Sleep apnea, unspecified: Secondary | ICD-10-CM | POA: Diagnosis not present

## 2015-05-03 DIAGNOSIS — I4891 Unspecified atrial fibrillation: Secondary | ICD-10-CM | POA: Insufficient documentation

## 2015-05-03 DIAGNOSIS — Z86718 Personal history of other venous thrombosis and embolism: Secondary | ICD-10-CM | POA: Insufficient documentation

## 2015-05-03 DIAGNOSIS — M75101 Unspecified rotator cuff tear or rupture of right shoulder, not specified as traumatic: Secondary | ICD-10-CM | POA: Diagnosis not present

## 2015-05-03 DIAGNOSIS — M25511 Pain in right shoulder: Secondary | ICD-10-CM | POA: Diagnosis present

## 2015-05-03 DIAGNOSIS — E785 Hyperlipidemia, unspecified: Secondary | ICD-10-CM | POA: Diagnosis not present

## 2015-05-03 DIAGNOSIS — K449 Diaphragmatic hernia without obstruction or gangrene: Secondary | ICD-10-CM | POA: Insufficient documentation

## 2015-05-03 DIAGNOSIS — I129 Hypertensive chronic kidney disease with stage 1 through stage 4 chronic kidney disease, or unspecified chronic kidney disease: Secondary | ICD-10-CM | POA: Diagnosis not present

## 2015-05-03 DIAGNOSIS — W19XXXA Unspecified fall, initial encounter: Secondary | ICD-10-CM | POA: Insufficient documentation

## 2015-05-03 DIAGNOSIS — Z6841 Body Mass Index (BMI) 40.0 and over, adult: Secondary | ICD-10-CM | POA: Diagnosis not present

## 2015-05-03 DIAGNOSIS — Z9989 Dependence on other enabling machines and devices: Secondary | ICD-10-CM | POA: Diagnosis not present

## 2015-05-03 DIAGNOSIS — M75100 Unspecified rotator cuff tear or rupture of unspecified shoulder, not specified as traumatic: Secondary | ICD-10-CM | POA: Diagnosis present

## 2015-05-03 DIAGNOSIS — Z905 Acquired absence of kidney: Secondary | ICD-10-CM | POA: Insufficient documentation

## 2015-05-03 DIAGNOSIS — E1122 Type 2 diabetes mellitus with diabetic chronic kidney disease: Secondary | ICD-10-CM | POA: Insufficient documentation

## 2015-05-03 DIAGNOSIS — N183 Chronic kidney disease, stage 3 (moderate): Secondary | ICD-10-CM | POA: Diagnosis not present

## 2015-05-03 HISTORY — PX: SHOULDER OPEN ROTATOR CUFF REPAIR: SHX2407

## 2015-05-03 LAB — TYPE AND SCREEN
ABO/RH(D): A POS
Antibody Screen: NEGATIVE

## 2015-05-03 LAB — GLUCOSE, CAPILLARY: GLUCOSE-CAPILLARY: 194 mg/dL — AB (ref 65–99)

## 2015-05-03 LAB — PROTIME-INR
INR: 1.59 — ABNORMAL HIGH (ref 0.00–1.49)
Prothrombin Time: 19 seconds — ABNORMAL HIGH (ref 11.6–15.2)

## 2015-05-03 SURGERY — REPAIR, ROTATOR CUFF, OPEN
Anesthesia: General | Site: Shoulder | Laterality: Right

## 2015-05-03 MED ORDER — LIDOCAINE HCL (CARDIAC) 20 MG/ML IV SOLN
INTRAVENOUS | Status: AC
  Filled 2015-05-03: qty 5

## 2015-05-03 MED ORDER — CEFAZOLIN SODIUM 1-5 GM-% IV SOLN
1.0000 g | Freq: Four times a day (QID) | INTRAVENOUS | Status: AC
  Administered 2015-05-03 – 2015-05-04 (×3): 1 g via INTRAVENOUS
  Filled 2015-05-03 (×3): qty 50

## 2015-05-03 MED ORDER — FUROSEMIDE 40 MG PO TABS
40.0000 mg | ORAL_TABLET | Freq: Every day | ORAL | Status: DC
  Administered 2015-05-03 – 2015-05-04 (×2): 40 mg via ORAL
  Filled 2015-05-03 (×2): qty 1

## 2015-05-03 MED ORDER — QUINAPRIL HCL 10 MG PO TABS: 20.0000 mg | ORAL_TABLET | Freq: Every day | ORAL | Status: DC

## 2015-05-03 MED ORDER — SUGAMMADEX SODIUM 200 MG/2ML IV SOLN
INTRAVENOUS | Status: DC | PRN
  Administered 2015-05-03: 400 mg via INTRAVENOUS

## 2015-05-03 MED ORDER — HYDROMORPHONE HCL 1 MG/ML IJ SOLN
1.0000 mg | INTRAMUSCULAR | Status: DC | PRN
Start: 2015-05-03 — End: 2015-05-04
  Administered 2015-05-03: 1 mg via INTRAVENOUS
  Filled 2015-05-03 (×2): qty 1

## 2015-05-03 MED ORDER — ACETAMINOPHEN 650 MG RE SUPP: 650.0000 mg | Freq: Four times a day (QID) | RECTAL | Status: DC | PRN

## 2015-05-03 MED ORDER — LACTATED RINGERS IV SOLN
INTRAVENOUS | Status: DC
Start: 2015-05-03 — End: 2015-05-03

## 2015-05-03 MED ORDER — POLYMYXIN B SULFATE 500000 UNITS IJ SOLR
INTRAMUSCULAR | Status: DC | PRN
  Administered 2015-05-03: 500 mL

## 2015-05-03 MED ORDER — METOCLOPRAMIDE HCL 5 MG/ML IJ SOLN: 5.0000 mg | Freq: Three times a day (TID) | INTRAMUSCULAR | Status: DC | PRN

## 2015-05-03 MED ORDER — PHENOL 1.4 % MT LIQD: 1.0000 | OROMUCOSAL | Status: DC | PRN

## 2015-05-03 MED ORDER — FENTANYL CITRATE (PF) 250 MCG/5ML IJ SOLN
INTRAMUSCULAR | Status: AC
  Filled 2015-05-03: qty 5

## 2015-05-03 MED ORDER — FENTANYL CITRATE (PF) 100 MCG/2ML IJ SOLN
INTRAMUSCULAR | Status: DC | PRN
  Administered 2015-05-03 (×2): 50 ug via INTRAVENOUS
  Administered 2015-05-03: 100 ug via INTRAVENOUS

## 2015-05-03 MED ORDER — FLEET ENEMA 7-19 GM/118ML RE ENEM: 1.0000 | ENEMA | Freq: Once | RECTAL | Status: DC | PRN

## 2015-05-03 MED ORDER — ROCURONIUM BROMIDE 100 MG/10ML IV SOLN
INTRAVENOUS | Status: DC | PRN
  Administered 2015-05-03: 50 mg via INTRAVENOUS

## 2015-05-03 MED ORDER — SUCCINYLCHOLINE CHLORIDE 20 MG/ML IJ SOLN
INTRAMUSCULAR | Status: DC | PRN
  Administered 2015-05-03: 150 mg via INTRAVENOUS

## 2015-05-03 MED ORDER — THROMBIN 5000 UNITS EX SOLR
CUTANEOUS | Status: AC
  Filled 2015-05-03: qty 5000

## 2015-05-03 MED ORDER — LACTATED RINGERS IV SOLN
INTRAVENOUS | Status: DC
  Administered 2015-05-03 (×3): via INTRAVENOUS

## 2015-05-03 MED ORDER — POLYVINYL ALCOHOL 1.4 % OP SOLN
1.0000 [drp] | Freq: Two times a day (BID) | OPHTHALMIC | Status: DC | PRN
  Filled 2015-05-03: qty 15

## 2015-05-03 MED ORDER — POTASSIUM CHLORIDE CRYS ER 10 MEQ PO TBCR
10.0000 meq | EXTENDED_RELEASE_TABLET | Freq: Every day | ORAL | Status: DC
  Administered 2015-05-03 – 2015-05-04 (×2): 10 meq via ORAL
  Filled 2015-05-03 (×2): qty 1

## 2015-05-03 MED ORDER — OXYCODONE-ACETAMINOPHEN 5-325 MG PO TABS: 2.0000 | ORAL_TABLET | ORAL | Status: DC | PRN

## 2015-05-03 MED ORDER — FERROUS SULFATE 325 (65 FE) MG PO TBEC
325.0000 mg | DELAYED_RELEASE_TABLET | Freq: Every day | ORAL | Status: DC
Start: 2015-05-03 — End: 2015-05-03

## 2015-05-03 MED ORDER — BISACODYL 5 MG PO TBEC: 5.0000 mg | DELAYED_RELEASE_TABLET | Freq: Every day | ORAL | Status: DC | PRN

## 2015-05-03 MED ORDER — SUGAMMADEX SODIUM 200 MG/2ML IV SOLN
INTRAVENOUS | Status: AC
  Filled 2015-05-03: qty 4

## 2015-05-03 MED ORDER — PROPOFOL 10 MG/ML IV BOLUS
INTRAVENOUS | Status: AC
Start: 2015-05-03 — End: 2015-05-03
  Filled 2015-05-03: qty 20

## 2015-05-03 MED ORDER — HYDROMORPHONE HCL 1 MG/ML IJ SOLN
INTRAMUSCULAR | Status: AC
  Filled 2015-05-03: qty 1

## 2015-05-03 MED ORDER — SODIUM CHLORIDE 0.9 % IJ SOLN
INTRAMUSCULAR | Status: AC
  Filled 2015-05-03: qty 50

## 2015-05-03 MED ORDER — CEFAZOLIN SODIUM 10 G IJ SOLR
3.0000 g | INTRAMUSCULAR | Status: AC
  Administered 2015-05-03: 3 g via INTRAVENOUS
  Filled 2015-05-03: qty 3000

## 2015-05-03 MED ORDER — CARVEDILOL 3.125 MG PO TABS
3.1250 mg | ORAL_TABLET | Freq: Two times a day (BID) | ORAL | Status: DC
  Administered 2015-05-03 – 2015-05-04 (×2): 3.125 mg via ORAL
  Filled 2015-05-03 (×4): qty 1

## 2015-05-03 MED ORDER — METOCLOPRAMIDE HCL 10 MG PO TABS: 5.0000 mg | ORAL_TABLET | Freq: Three times a day (TID) | ORAL | Status: DC | PRN

## 2015-05-03 MED ORDER — CHLORHEXIDINE GLUCONATE 4 % EX LIQD: 60.0000 mL | Freq: Once | CUTANEOUS | Status: DC

## 2015-05-03 MED ORDER — ONDANSETRON HCL 4 MG/2ML IJ SOLN: 4.0000 mg | Freq: Four times a day (QID) | INTRAMUSCULAR | Status: DC | PRN

## 2015-05-03 MED ORDER — ONDANSETRON HCL 4 MG/2ML IJ SOLN
INTRAMUSCULAR | Status: AC
  Filled 2015-05-03: qty 2

## 2015-05-03 MED ORDER — BUPIVACAINE LIPOSOME 1.3 % IJ SUSP
20.0000 mL | Freq: Once | INTRAMUSCULAR | Status: AC
  Administered 2015-05-03: 20 mL
  Filled 2015-05-03: qty 20

## 2015-05-03 MED ORDER — ONDANSETRON HCL 4 MG PO TABS: 4.0000 mg | ORAL_TABLET | Freq: Four times a day (QID) | ORAL | Status: DC | PRN

## 2015-05-03 MED ORDER — POLYSACCHARIDE IRON COMPLEX 150 MG PO CAPS
150.0000 mg | ORAL_CAPSULE | Freq: Two times a day (BID) | ORAL | Status: DC
  Administered 2015-05-03 – 2015-05-04 (×2): 150 mg via ORAL
  Filled 2015-05-03 (×3): qty 1

## 2015-05-03 MED ORDER — LACTATED RINGERS IV SOLN
INTRAVENOUS | Status: DC
  Administered 2015-05-03 (×2): via INTRAVENOUS

## 2015-05-03 MED ORDER — POLYETHYLENE GLYCOL 3350 17 G PO PACK: 17.0000 g | PACK | Freq: Every day | ORAL | Status: DC | PRN

## 2015-05-03 MED ORDER — ACETAMINOPHEN 325 MG PO TABS: 650.0000 mg | ORAL_TABLET | Freq: Four times a day (QID) | ORAL | Status: DC | PRN

## 2015-05-03 MED ORDER — LISINOPRIL 20 MG PO TABS
20.0000 mg | ORAL_TABLET | Freq: Every day | ORAL | Status: DC
  Administered 2015-05-03 – 2015-05-04 (×2): 20 mg via ORAL
  Filled 2015-05-03 (×2): qty 1

## 2015-05-03 MED ORDER — LIDOCAINE HCL (CARDIAC) 20 MG/ML IV SOLN
INTRAVENOUS | Status: DC | PRN
  Administered 2015-05-03: 50 mg via INTRAVENOUS

## 2015-05-03 MED ORDER — PROPOFOL 10 MG/ML IV BOLUS
INTRAVENOUS | Status: DC | PRN
  Administered 2015-05-03: 200 mg via INTRAVENOUS

## 2015-05-03 MED ORDER — PHENYLEPHRINE HCL 10 MG/ML IJ SOLN
INTRAMUSCULAR | Status: DC | PRN
  Administered 2015-05-03: 80 ug via INTRAVENOUS
  Administered 2015-05-03 (×2): 40 ug via INTRAVENOUS
  Administered 2015-05-03: 80 ug via INTRAVENOUS

## 2015-05-03 MED ORDER — THROMBIN 5000 UNITS EX SOLR
CUTANEOUS | Status: DC | PRN
  Administered 2015-05-03: 10000 [IU] via TOPICAL

## 2015-05-03 MED ORDER — POLYMYXIN B SULFATE 500000 UNITS IJ SOLR
INTRAMUSCULAR | Status: AC
  Filled 2015-05-03: qty 1

## 2015-05-03 MED ORDER — HYDROMORPHONE HCL 1 MG/ML IJ SOLN
0.2500 mg | INTRAMUSCULAR | Status: DC | PRN
  Administered 2015-05-03 (×4): 0.5 mg via INTRAVENOUS

## 2015-05-03 MED ORDER — MENTHOL 3 MG MT LOZG: 1.0000 | LOZENGE | OROMUCOSAL | Status: DC | PRN

## 2015-05-03 MED ORDER — HYDROCODONE-ACETAMINOPHEN 5-325 MG PO TABS
1.0000 | ORAL_TABLET | ORAL | Status: DC | PRN
  Administered 2015-05-03: 2 via ORAL
  Administered 2015-05-03: 1 via ORAL
  Administered 2015-05-04 (×2): 2 via ORAL
  Filled 2015-05-03 (×2): qty 1
  Filled 2015-05-03 (×2): qty 2
  Filled 2015-05-03: qty 1

## 2015-05-03 SURGICAL SUPPLY — 52 items
ANCH SUT 2 5.5 BABSR ASCP (Orthopedic Implant) ×1 IMPLANT
ANCHOR PEEK ZIP 5.5 NDL NO2 (Orthopedic Implant) ×2 IMPLANT
ATTRACTOMAT 16X20 MAGNETIC DRP (DRAPES) ×3 IMPLANT
BAG SPEC THK2 15X12 ZIP CLS (MISCELLANEOUS)
BAG ZIPLOCK 12X15 (MISCELLANEOUS) IMPLANT
BLADE OSCILLATING/SAGITTAL (BLADE) ×3
BLADE SW THK.38XMED LNG THN (BLADE) ×1 IMPLANT
BNDG COHESIVE 6X5 TAN STRL LF (GAUZE/BANDAGES/DRESSINGS) ×3 IMPLANT
BUR OVAL CARBIDE 4.0 (BURR) ×3 IMPLANT
CLOSURE STERI-STRIP 1/4X4 (GAUZE/BANDAGES/DRESSINGS) ×2 IMPLANT
DERMASPAN .5-.9MM 4X4CM SHOU (Miscellaneous) ×2 IMPLANT
DRAPE POUCH INSTRU U-SHP 10X18 (DRAPES) ×3 IMPLANT
DRSG ADAPTIC 3X8 NADH LF (GAUZE/BANDAGES/DRESSINGS) ×2 IMPLANT
DRSG AQUACEL AG ADV 3.5X 6 (GAUZE/BANDAGES/DRESSINGS) ×3 IMPLANT
DURAPREP 26ML APPLICATOR (WOUND CARE) ×5 IMPLANT
ELECT BLADE TIP CTD 4 INCH (ELECTRODE) IMPLANT
ELECT REM PT RETURN 9FT ADLT (ELECTROSURGICAL) ×3
ELECTRODE REM PT RTRN 9FT ADLT (ELECTROSURGICAL) ×1 IMPLANT
GAUZE SPONGE 4X4 16PLY XRAY LF (GAUZE/BANDAGES/DRESSINGS) ×2 IMPLANT
GLOVE BIOGEL PI IND STRL 6.5 (GLOVE) ×1 IMPLANT
GLOVE BIOGEL PI IND STRL 8 (GLOVE) ×1 IMPLANT
GLOVE BIOGEL PI INDICATOR 6.5 (GLOVE) ×2
GLOVE BIOGEL PI INDICATOR 8 (GLOVE) ×2
GLOVE ECLIPSE 8.0 STRL XLNG CF (GLOVE) ×3 IMPLANT
GLOVE SURG SS PI 6.5 STRL IVOR (GLOVE) ×3 IMPLANT
GOWN STRL REUS W/TWL LRG LVL3 (GOWN DISPOSABLE) ×3 IMPLANT
GOWN STRL REUS W/TWL XL LVL3 (GOWN DISPOSABLE) ×3 IMPLANT
KIT BASIN OR (CUSTOM PROCEDURE TRAY) ×3 IMPLANT
KIT POSITION SHOULDER SCHLEI (MISCELLANEOUS) ×3 IMPLANT
LIQUID BAND (GAUZE/BANDAGES/DRESSINGS) ×3 IMPLANT
MANIFOLD NEPTUNE II (INSTRUMENTS) ×3 IMPLANT
NDL MA TROC 1/2 (NEEDLE) IMPLANT
NEEDLE MA TROC 1/2 (NEEDLE) IMPLANT
NS IRRIG 1000ML POUR BTL (IV SOLUTION) ×2 IMPLANT
PACK SHOULDER (CUSTOM PROCEDURE TRAY) ×3 IMPLANT
POSITIONER SURGICAL ARM (MISCELLANEOUS) ×3 IMPLANT
SLING ARM IMMOBILIZER LRG (SOFTGOODS) ×3 IMPLANT
SPONGE LAP 4X18 X RAY DECT (DISPOSABLE) ×4 IMPLANT
SPONGE SURGIFOAM ABS GEL 100 (HEMOSTASIS) ×3 IMPLANT
STAPLER VISISTAT 35W (STAPLE) IMPLANT
SUCTION FRAZIER HANDLE 12FR (TUBING) ×2
SUCTION TUBE FRAZIER 12FR DISP (TUBING) ×1 IMPLANT
SUT BONE WAX W31G (SUTURE) ×3 IMPLANT
SUT ETHIBOND NAB CT1 #1 30IN (SUTURE) ×3 IMPLANT
SUT MNCRL AB 4-0 PS2 18 (SUTURE) ×3 IMPLANT
SUT VIC AB 0 CT1 27 (SUTURE)
SUT VIC AB 0 CT1 27XBRD ANTBC (SUTURE) IMPLANT
SUT VIC AB 1 CT1 27 (SUTURE) ×6
SUT VIC AB 1 CT1 27XBRD ANTBC (SUTURE) ×2 IMPLANT
SUT VIC AB 2-0 CT1 27 (SUTURE) ×6
SUT VIC AB 2-0 CT1 TAPERPNT 27 (SUTURE) ×2 IMPLANT
TOWEL OR 17X26 10 PK STRL BLUE (TOWEL DISPOSABLE) ×3 IMPLANT

## 2015-05-03 NOTE — Brief Op Note (Signed)
05/03/2015  12:43 PM  PATIENT:  [name]  75 y.o. male  PRE-OPERATIVE DIAGNOSIS:  RIGHT SHOULDER ROTATOR CUFF TEAR ,Very Complex with Retraction .Morbid Obesity  POST-OPERATIVE DIAGNOSIS:  Same as Pre-Op  PROCEDURE:  Procedure(s): RIGHT SHOULDER ROTATOR CUFF REPAIR OPEN WITH GRAFT AND ANCHOR  (Right),Very Complex  SURGEON:  Surgeon(s) and Role:    * Latanya Maudlin, MD - Primary  PHYSICIAN ASSISTANT: Ardeen Jourdain PA  ASSISTANTS: Ardeen Jourdain PA  ANESTHESIA:   general  EBL:  Total I/O In: 1000 [I.V.:1000] Out: 250 [Urine:200; Blood:50]  BLOOD ADMINISTERED:none  DRAINS: none   LOCAL MEDICATIONS USED: None  SPECIMEN:  No Specimen  DISPOSITION OF SPECIMEN:  N/A  COUNTS:  YES  TOURNIQUET:  * No tourniquets in log *  DICTATION: .Other Dictation: Dictation Number [id number]  PLAN OF CARE: Admit for overnight observation  PATIENT DISPOSITION:  Stable in OR   Delay start of Pharmacological VTE agent (>24hrs) due to surgical blood loss or risk of bleeding: yes

## 2015-05-03 NOTE — H&P (Signed)
[name] is an 75 y.o. male.   Chief Complaint: pain and limited motion of his Right shoulder. HPI: He fell and injured his Right Shoulder.  Past Medical History  Diagnosis Date  . Obesity   . Phlebitis     Lower extermity  . Pulmonary emboli ([place]) 2008    submassive, saddle  . Prostate cancer ([place]) 07/2009  . Sleep apnea     on CPAP  . Hx of echocardiogram 12/04/2010    Normal EF >55% no significant valve disease  . History of stress test 06/27/2009    Low risk and EF of approximately 50%  . DVT (deep venous thrombosis) ([place])   . Chronic kidney disease, stage 3     baseline creatinine ~1.4  . HLD (hyperlipidemia)   . HTN (hypertension)   . Dysrhythmia     A fib  . Diabetes mellitus ([place])     diet controlled  . History of hiatal hernia     Past Surgical History  Procedure Laterality Date  . Nephrectomy  1999    for CA  . Cholecystectomy  1999  . Transurethral resection of bladder tumor N/A 03/04/2013    Procedure: CYSTOSCOPY WITH RIGHT RETROGRADE PYELOGRAM AND BLADDER BIOPSY /CLOT EVACUATION/ BIOPSY PROSTATIC URETHRA WITH FULGERATION ;  Surgeon: Molli Hazard, MD;  Location: [facility];  Service: Urology;  Laterality: N/A;  . Cystoscopy w/ retrogrades N/A 04/08/2013    Procedure: CYSTOSCOPY WITH CLOT EVACUATION;  Surgeon: Molli Hazard, MD;  Location: [facility];  Service: Urology;  Laterality: N/A;  . Left shoulder repair      Family History  Problem Relation Age of Onset  . Cancer Mother   . Diabetes Father   . Cancer Father    Social History:  reports that he has never smoked. He has never used smokeless tobacco. He reports that he drinks alcohol. He reports that he does not use illicit drugs.  Allergies: No Known Allergies  Medications Prior to Admission  Medication Sig Dispense Refill  . carvedilol (COREG) 3.125 MG tablet take 1 tablet by mouth twice a day with meals 60 tablet 5  . fenofibrate (TRICOR) 145 MG tablet Take 145 mg by mouth daily.      . ferrous sulfate 325 (65 FE) MG EC tablet take 1 tablet by mouth once daily 30 tablet 6  . furosemide (LASIX) 40 MG tablet take 1 tablet by mouth once daily 30 tablet 6  . iron polysaccharides (NIFEREX) 150 MG capsule Take 150 mg by mouth 2 (two) times daily.    . Niacin CR 1000 MG TBCR Take 1,000 mg by mouth at bedtime.     . polyvinyl alcohol (LIQUIFILM TEARS) 1.4 % ophthalmic solution Place 1 drop into both eyes 2 (two) times daily as needed for dry eyes.    . potassium chloride (K-DUR,KLOR-CON) 10 MEQ tablet take 1 tablet by mouth once daily 30 tablet 5  . quinapril (ACCUPRIL) 20 MG tablet Take 20 mg by mouth daily.   0  . warfarin (COUMADIN) 1 MG tablet Take 1 mg by mouth every Monday, Wednesday, and Friday.    . warfarin (COUMADIN) 5 MG tablet Take 5-6 mg by mouth daily. 6 mg on Monday, Wednesday, and Friday.  Take 5 mg on Tuesday, Thursday, Saturday, and Sunday.      Results for orders placed or performed during the hospital encounter of 05/03/15 (from the past 48 hour(s))  Glucose, capillary     Status: Abnormal  Collection Time: 05/03/15  8:58 AM  Result Value Ref Range   Glucose-Capillary 194 (H) 65 - 99 mg/dL   Comment 1 Notify RN    No results found.  Review of Systems  Constitutional: Negative.   HENT: Negative.   Eyes: Negative.   Cardiovascular: Negative.   Gastrointestinal: Negative.   Genitourinary: Negative.   Musculoskeletal: Positive for joint pain and falls.  Skin: Negative.   Neurological: Negative.   Endo/Heme/Allergies: Negative.   Psychiatric/Behavioral: Negative.     Blood pressure 120/78, pulse 76, temperature 98.3 F (36.8 C), temperature source Oral, resp. rate 18, height 5\' 9"  (1.753 m), weight 126.554 kg (279 lb), SpO2 100 %. Physical Exam  Constitutional: He appears well-developed.  HENT:  Head: Normocephalic.  Eyes: Pupils are equal, round, and reactive to light.  Neck: Normal range of motion.  Cardiovascular: Normal rate.   Respiratory:  Effort normal.  GI: Soft.  Musculoskeletal:  Pain and limited motion of his Right Shoulder.  Neurological: He is alert.  Skin: Skin is warm.  Psychiatric: He has a normal mood and affect.     Assessment/Plan Open Acromionectomy and Repair of his Right Rotator Cuff. And possible Graft and Anchor.  Tobi Bastos, MD 05/03/2015, 10:43 AM

## 2015-05-03 NOTE — Anesthesia Preprocedure Evaluation (Addendum)
Anesthesia Evaluation  Patient identified by MRN, date of birth, ID band Patient awake    Reviewed: Allergy & Precautions, H&P , NPO status , Patient's Chart, lab work & pertinent test results, reviewed documented beta blocker date and time   Airway Mallampati: II  TM Distance: >3 FB Neck ROM: full    Dental no notable dental hx. (+) Teeth Intact, Dental Advisory Given   Pulmonary sleep apnea and Continuous Positive Airway Pressure Ventilation , PE Severe OSA   Pulmonary exam normal breath sounds clear to auscultation       Cardiovascular Exercise Tolerance: Good hypertension, Pt. on home beta blockers Normal cardiovascular exam+ dysrhythmias Atrial Fibrillation  Rhythm:regular Rate:Normal  ECG AF. Echo 2016 EF 55%.  Low risk stress test 2017   Neuro/Psych negative neurological ROS  negative psych ROS   GI/Hepatic negative GI ROS, Neg liver ROS, hiatal hernia,   Endo/Other  diabetes, Well Controlled, Type 2Morbid obesityDiet controlled DM  Renal/GU Renal diseaseStage 3 chronic kidney disease  negative genitourinary   Musculoskeletal   Abdominal (+) + obese,   Peds  Hematology negative hematology ROS (+)   Anesthesia Other Findings   Reproductive/Obstetrics negative OB ROS                        Anesthesia Physical Anesthesia Plan  ASA: III  Anesthesia Plan: General   Post-op Pain Management:    Induction: Intravenous  Airway Management Planned: Oral ETT  Additional Equipment:   Intra-op Plan:   Post-operative Plan: Extubation in OR  Informed Consent: I have reviewed the patients History and Physical, chart, labs and discussed the procedure including the risks, benefits and alternatives for the proposed anesthesia with the patient or authorized representative who has indicated his/her understanding and acceptance.   Dental Advisory Given  Plan Discussed with: CRNA and  Surgeon  Anesthesia Plan Comments:         Anesthesia Quick Evaluation

## 2015-05-03 NOTE — Interval H&P Note (Signed)
History and Physical Interval Note:  05/03/2015 10:49 AM  [name]  has presented today for surgery, with the diagnosis of RIGHT SHOULDER ROTATOR CUFF TEAR   The various methods of treatment have been discussed with the patient and family. After consideration of risks, benefits and other options for treatment, the patient has consented to  Procedure(s): RIGHT SHOULDER ROTATOR CUFF REPAIR OPEN WITH GRAFT AND ANCHOR  (Right) as a surgical intervention .  The patient's history has been reviewed, patient examined, no change in status, stable for surgery.  I have reviewed the patient's chart and labs.  Questions were answered to the patient's satisfaction.     Coleen Cardiff A

## 2015-05-03 NOTE — Anesthesia Postprocedure Evaluation (Signed)
Anesthesia Post Note  Patient: [name]  Procedure(s) Performed: Procedure(s) (LRB): RIGHT SHOULDER ROTATOR CUFF REPAIR OPEN WITH GRAFT AND ANCHOR  (Right)  Patient location during evaluation: PACU Anesthesia Type: General Level of consciousness: awake and alert Pain management: pain level controlled Vital Signs Assessment: post-procedure vital signs reviewed and stable Respiratory status: spontaneous breathing, nonlabored ventilation, respiratory function stable and patient connected to nasal cannula oxygen Cardiovascular status: blood pressure returned to baseline and stable Postop Assessment: no signs of nausea or vomiting Anesthetic complications: no    Last Vitals:  Filed Vitals:   05/03/15 1430 05/03/15 1454  BP: 135/80 137/76  Pulse: 52 60  Temp:  36.5 C  Resp: 12     Last Pain:  Filed Vitals:   05/03/15 1457  PainSc: 10-Worst pain ever                 Tavian Callander L

## 2015-05-03 NOTE — Transfer of Care (Signed)
Immediate Anesthesia Transfer of Care Note  Patient: [name]  Procedure(s) Performed: Procedure(s): RIGHT SHOULDER ROTATOR CUFF REPAIR OPEN WITH GRAFT AND ANCHOR  (Right)  Patient Location: PACU  Anesthesia Type:General  Level of Consciousness: sedated, patient cooperative and responds to stimulation  Airway & Oxygen Therapy: Patient Spontanous Breathing and Patient connected to face mask oxygen  Post-op Assessment: Report given to RN and Post -op Vital signs reviewed and stable  Post vital signs: Reviewed and stable  Last Vitals:  Filed Vitals:   05/03/15 0834  BP: 120/78  Pulse: 76  Temp: 36.8 C  Resp: 18    Complications: No apparent anesthesia complications

## 2015-05-03 NOTE — Anesthesia Procedure Notes (Signed)
Procedure Name: Intubation Performed by: Aniken Monestime J Pre-anesthesia Checklist: Patient identified, Emergency Drugs available, Suction available, Patient being monitored and Timeout performed Patient Re-evaluated:Patient Re-evaluated prior to inductionOxygen Delivery Method: Circle system utilized Preoxygenation: Pre-oxygenation with 100% oxygen Intubation Type: IV induction Ventilation: Mask ventilation without difficulty Laryngoscope Size: Mac and 4 Grade View: Grade II Tube type: Oral Tube size: 7.5 mm Number of attempts: 1 Airway Equipment and Method: Stylet Placement Confirmation: ETT inserted through vocal cords under direct vision,  positive ETCO2,  CO2 detector and breath sounds checked- equal and bilateral Secured at: 23 cm Tube secured with: Tape Dental Injury: Teeth and Oropharynx as per pre-operative assessment        

## 2015-05-03 NOTE — Op Note (Signed)
[name], [name] NO.:  [id number]  MEDICAL RECORD NO.:  [id number]  LOCATION:  33                         FACILITY:  [facility]  PHYSICIAN:  Kipp Brood. Jamilex Bohnsack, M.D.DATE OF BIRTH:  06/06/1940  DATE OF PROCEDURE:  05/03/2015 DATE OF DISCHARGE:                              OPERATIVE REPORT   SURGEON:  Kipp Brood. Gladstone Lighter, M.D.  ASSISTANT:  Ardeen Jourdain, [place]  PREOPERATIVE DIAGNOSES: 1. Severe complex retracted tear of the right rotator cuff. 2. Morbid obesity.  POSTOPERATIVE DIAGNOSES: 1. Severe complex retracted tear of the right rotator cuff. 2. Morbid obesity.  OPERATIONS: 1. Open acromionectomy, right shoulder. 2. Open repair of a complex retracted tear of the right rotator cuff. 3. DermaSpan graft to the right rotator cuff. 4. One anchor was used.  DESCRIPTION OF PROCEDURE:  Under general anesthesia, routine orthopedic prep and draping of the right upper extremity was carried out.  The appropriate time-out was carried out.  He had 3 g of IV Ancef.  The patient was in the beach-chair position.  An incision was made over the anterior aspect of the right shoulder, and bleeders were identified and cauterized.  At this particular time, I then made an incision then over the insertion of the deltoid tendon after the self-retaining retractors were inserted.  I then retracted the deltoid tendon, then I split the proximal part of the deltoid muscle.  Immediately upon going down to the shoulder, fluid came out.  Note, this was indicative of definite tear. I then protected the underlying rotator cuff and then did open acromionectomy and acromioplasty on the acromion.  The acromion was severely thickened and down slope.  Note, this cuff then was explored after I did a partial bursectomy.  The cuff was literally in pieces, I liked that and retracted.  I brought the pieces forward, sutured those in place and had a very I thought satisfactory repair, but as  I mentioned, the cuff was in pieces.  We then utilized a DermaSpan graft with one anchor in the proximal humerus and sutured the anchor.  We anchored the graft down in place.  I then thoroughly irrigated out the shoulder.  I bone waxed the undersurface of the acromion.  I utilized thrombin-soaked Gelfoam in the posterior aspect of the shoulder.  I then reapproximated the deltoid tendon muscle in usual fashion with #1 Ethibond.  The remaining part of the muscle was closed with 0 Vicryl, subcu was closed with 0 Vicryl, and the skin then was closed subcuticular in nature.  Sterile dressings were applied.  The patient was then placed in a shoulder immobilizer.          ______________________________ Kipp Brood Gladstone Lighter, M.D.     RAG/MEDQ  D:  05/03/2015  T:  05/03/2015  Job:  [id number]

## 2015-05-04 DIAGNOSIS — M75101 Unspecified rotator cuff tear or rupture of right shoulder, not specified as traumatic: Secondary | ICD-10-CM | POA: Diagnosis not present

## 2015-05-04 MED ORDER — HYDROCODONE-ACETAMINOPHEN 5-325 MG PO TABS: 1.0000 | ORAL_TABLET | ORAL | Status: DC | PRN

## 2015-05-04 NOTE — Evaluation (Addendum)
Occupational Therapy Evaluation Patient Details Name: [name] MRN: [id number] DOB: 1940/09/14 Today's Date: 05/04/2015    History of Present Illness Pt is s/p open acromionectomy, RCR with graft and anchor   Clinical Impression   This 75 year old man was admitted for the above surgery. Will return once more prior to discharge to have wife practice with donning sling and observe shirt.  Wife will assist at home     Follow Up Recommendations  Supervision/Assistance - 24 hour (pt will follow up with Dr Gladstone Lighter)    Equipment Recommendations  None recommended by OT    Recommendations for Other Services       Precautions / Restrictions Precautions Precautions: Shoulder Type of Shoulder Precautions: passive protocol Restrictions Weight Bearing Restrictions: No      Mobility Bed Mobility Overal bed mobility: Needs Assistance Bed Mobility: Supine to Sit     Supine to sit: Min assist     General bed mobility comments: for trunk. Pt sleeps in a recliner  Transfers Overall transfer level: Needs assistance Equipment used: None Transfers: Sit to/from Stand Sit to Stand: Min guard         General transfer comment: for safety    Balance                                            ADL Overall ADL's : Needs assistance/impaired     Grooming: Set up;Sitting   Upper Body Bathing: Moderate assistance;Sitting   Lower Body Bathing: Maximal assistance;Sit to/from stand   Upper Body Dressing : Maximal assistance;Sitting   Lower Body Dressing: Maximal assistance;Sit to/from stand   Toilet Transfer: Minimal assistance;Ambulation;Comfort height toilet   Toileting- Clothing Manipulation and Hygiene: Minimal assistance;Sit to/from stand (pt is able to empty catheter himself with min guard)         General ADL Comments: Educated on shoulder protocol and handout given.  Washed under arm and reapplied sling. Wife will bring a button down shirt later  and she will practice with sling.  Pt with increased pain during ADLs--he has a high pain tolerance. Pt and wife verbalize understanding of protocol.  Extended waist strap and thumb loop     Vision     Perception     Praxis      Pertinent Vitals/Pain Pain Assessment: 0-10 Pain Score: 4  Pain Location: R shoulder during ADL Pain Descriptors / Indicators: Aching Pain Intervention(s): Limited activity within patient's tolerance;Monitored during session;Premedicated before session;Repositioned;Ice applied     Hand Dominance Right   Extremity/Trunk Assessment Upper Extremity Assessment Upper Extremity Assessment: RUE deficits/detail RUE Deficits / Details: immobilized; distal ROM wfls           Communication Communication Communication: No difficulties   Cognition Arousal/Alertness: Awake/alert Behavior During Therapy: WFL for tasks assessed/performed Overall Cognitive Status: Within Functional Limits for tasks assessed                     General Comments       Exercises       Shoulder Instructions      Home Living Family/patient expects to be discharged to:: Private residence Living Arrangements: Spouse/significant other                 Bathroom Shower/Tub: Occupational psychologist: Handicapped height         Additional  Comments: pt wears compression hose and wife is unable to don them, will follow up with case management to see if there are other options.  Explained there is a device to help donning these from medical supply but wife would have to use it; it requires bil UEs.      Prior Functioning/Environment Level of Independence: Independent             OT Diagnosis: Generalized weakness   OT Problem List: Decreased activity tolerance;Decreased knowledge of precautions;Impaired UE functional use;Pain   OT Treatment/Interventions: Self-care/ADL training;DME and/or AE instruction;Patient/family education    OT  Goals(Current goals can be found in the care plan section) Acute Rehab OT Goals Patient Stated Goal: return to being independent OT Goal Formulation: With patient/family Time For Goal Achievement: 05/05/15 Potential to Achieve Goals: Good ADL Goals Additional ADL Goal #1: wife will don sling with supervision  OT Frequency: Min 2X/week   Barriers to D/C:            Co-evaluation              End of Session    Activity Tolerance: Patient tolerated treatment well Patient left: in chair;with call bell/phone within reach;with family/visitor present   Time: 0837-0922 OT Time Calculation (min): 45 min Charges:  OT General Charges $OT Visit: 1 Procedure OT Evaluation $OT Eval Low Complexity: 1 Procedure OT Treatments $Self Care/Home Management : 23-37 mins G-Codes: OT G-codes **NOT FOR INPATIENT CLASS** Functional Assessment Tool Used: clinical judgment and observation Functional Limitation: Self care Self Care Current Status [id number]): At least 60 percent but less than 80 percent impaired, limited or restricted Self Care Goal Status [id number]): At least 60 percent but less than 80 percent impaired, limited or restricted  Yarisa Lynam 05/04/2015, 10:06 AM  Lesle Chris, OTR/L [phone] 05/04/2015

## 2015-05-04 NOTE — Discharge Instructions (Signed)
Keep your sling on at all times, including sleeping in your sling. °The only time you should remove your sling is to shower only but you need to keep your hand against your chest while you shower.  °For the first few days, remove your dressing, tape a piece of saran wrap over your incision °Take your shower, then remove the saran wrap and put a clean dressing on, then reapply your sling. °After two days you can shower without the saran wrap.  °Call Dr. Gioffre if any wound complications or temperature of 101 degrees F or over.  °Call the office for an appointment to see Dr. Gioffre in two weeks: [phone] and ask for Dr. Gioffre's nurse, Tammy Johnson.  °

## 2015-05-04 NOTE — Progress Notes (Signed)
   Subjective: 1 Day Post-Op Procedure(s) (LRB): RIGHT SHOULDER ROTATOR CUFF REPAIR OPEN WITH GRAFT AND ANCHOR  (Right) Patient reports pain as mild.   Patient seen in rounds with Dr. Gladstone Lighter Patient is well, and has had no acute complaints or problems. Reports minimal discomfort. No issues overnight. No SOB or chest pain. No numbness or tingling in the fingers.   Objective: Vital signs in last 24 hours: Temp:  [97.5 F (36.4 C)-98.8 F (37.1 C)] 98.8 F (37.1 C) (03/29 1000) Pulse Rate:  [52-75] 75 (03/29 1000) Resp:  [10-21] 15 (03/29 0840) BP: (102-150)/(59-97) 102/59 mmHg (03/29 1000) SpO2:  [93 %-100 %] 96 % (03/29 1000)  Intake/Output from previous day:  Intake/Output Summary (Last 24 hours) at 05/04/15 1025 Last data filed at 05/04/15 0848  Gross per 24 hour  Intake   5140 ml  Output   1950 ml  Net   3190 ml    Intake/Output this shift: Total I/O In: 480 [P.O.:480] Out: 450 [Urine:450]   Recent Labs  05/03/15 1058  INR 1.59*    EXAM General - Patient is Alert and Oriented Extremity - Neurologically intact Neurovascular intact Intact pulses distally No cellulitis present Dressing/Incision - clean, dry, no drainage Motor Function - intact, moving hand and fingers well on exam.   Past Medical History  Diagnosis Date  . Obesity   . Phlebitis     Lower extermity  . Pulmonary emboli ([place]) 2008    submassive, saddle  . Prostate cancer ([place]) 07/2009  . Sleep apnea     on CPAP  . Hx of echocardiogram 12/04/2010    Normal EF >55% no significant valve disease  . History of stress test 06/27/2009    Low risk and EF of approximately 50%  . DVT (deep venous thrombosis) ([place])   . Chronic kidney disease, stage 3     baseline creatinine ~1.4  . HLD (hyperlipidemia)   . HTN (hypertension)   . Dysrhythmia     A fib  . Diabetes mellitus ([place])     diet controlled  . History of hiatal hernia     Assessment/Plan: 1 Day Post-Op Procedure(s) (LRB): RIGHT  SHOULDER ROTATOR CUFF REPAIR OPEN WITH GRAFT AND ANCHOR  (Right) Active Problems:   Rotator cuff tear  Estimated body mass index is 41.18 kg/(m^2) as calculated from the following:   Height as of this encounter: 5\' 9"  (1.753 m).   Weight as of this encounter: 126.554 kg (279 lb). Advance diet Up with therapy D/C IV fluids  DVT Prophylaxis - Coumadin  He should keep his sling on at all times. Discharge instructions discussed. Patient will have family member help his wife with compression stockings. DC home today with follow up in 2 weeks.   Ardeen Jourdain, PA-C Orthopaedic Surgery 05/04/2015, 10:25 AM

## 2015-05-04 NOTE — Progress Notes (Signed)
   05/04/15 1100  OT Visit Information  Last OT Received On 05/04/15  Assistance Needed +1  History of Present Illness Pt is s/p open acromionectomy, RCR with graft and anchor  OT Time Calculation  OT Start Time (ACUTE ONLY) 1047  OT Stop Time (ACUTE ONLY) 1109  OT Time Calculation (min) 22 min  Precautions  Precautions Shoulder  Type of Shoulder Precautions passive protocol  Pain Assessment  Pain Score 4  Pain Location R shoulder during adls  Pain Descriptors / Indicators Aching  Pain Intervention(s) Limited activity within patient's tolerance;Monitored during session;Premedicated before session;Repositioned;Ice applied  Cognition  Arousal/Alertness Awake/alert  Behavior During Therapy WFL for tasks assessed/performed  Overall Cognitive Status Within Functional Limits for tasks assessed  ADL  General ADL Comments assisted pt with donning shirt (max A) and wife donned sling with min A and cues. She verbalizes comfort in doing this.  CM looked into PAS stockings for pt and [facility] does not rent them. 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  I told him he will not get the same graded compression with PAS anyway, and they are sequential. He talked about wife wrapping legs and I reminded him to find his short stretch bandages and not ace wraps.  Pt thinks he will have his son come over and assist with donning them  Restrictions  Weight Bearing Restrictions No  Transfers  Transfers Sit to/from Stand  Sit to Stand Min guard  General transfer comment for safety  OT - End of Session  Activity Tolerance Patient tolerated treatment well  Patient left in chair;with call bell/phone within reach;with family/visitor present  OT Assessment/Plan  Follow Up Recommendations Supervision/Assistance - 24 hour (follow up with Dr Gladstone Lighter)  OT Equipment None recommended by OT  ADL Goals  Additional ADL Goal #1 wife will don sling with supervision  OT G-codes **NOT FOR INPATIENT CLASS**  Functional  Assessment Tool Used clinical judgment and observation  Self Care Discharge Status [id number]) CL  OT General Charges  $OT Visit 1 Procedure  OT Treatments  $Self Care/Home Management  8-22 mins  Lesle Chris, OTR/L [phone] 05/04/2015

## 2015-05-10 ENCOUNTER — Ambulatory Visit (INDEPENDENT_AMBULATORY_CARE_PROVIDER_SITE_OTHER): Payer: BLUE CROSS/BLUE SHIELD | Admitting: Cardiovascular Disease

## 2015-05-10 ENCOUNTER — Encounter: Payer: Self-pay | Admitting: Cardiovascular Disease

## 2015-05-10 DIAGNOSIS — Z7901 Long term (current) use of anticoagulants: Secondary | ICD-10-CM

## 2015-05-10 DIAGNOSIS — I4819 Other persistent atrial fibrillation: Secondary | ICD-10-CM

## 2015-05-10 DIAGNOSIS — I481 Persistent atrial fibrillation: Secondary | ICD-10-CM

## 2015-05-10 DIAGNOSIS — I10 Essential (primary) hypertension: Secondary | ICD-10-CM

## 2015-05-10 DIAGNOSIS — G4733 Obstructive sleep apnea (adult) (pediatric): Secondary | ICD-10-CM

## 2015-05-10 NOTE — Discharge Summary (Signed)
Physician Discharge Summary   Patient ID: [name] MRN: [id number] DOB/AGE: 04-26-1940 75 y.o.  Admit date: 05/03/2015 Discharge date: 05/04/2015  Primary Diagnosis: Right shoulder rotator cuff tear  Admission Diagnoses:  Past Medical History  Diagnosis Date  . Obesity   . Phlebitis     Lower extermity  . Pulmonary emboli ([place]) 2008    submassive, saddle  . Prostate cancer ([place]) 07/2009  . Sleep apnea     on CPAP  . Hx of echocardiogram 12/04/2010    Normal EF >55% no significant valve disease  . History of stress test 06/27/2009    Low risk and EF of approximately 50%  . DVT (deep venous thrombosis) ([place])   . Chronic kidney disease, stage 3     baseline creatinine ~1.4  . HLD (hyperlipidemia)   . HTN (hypertension)   . Dysrhythmia     A fib  . Diabetes mellitus ([place])     diet controlled  . History of hiatal hernia    Discharge Diagnoses:   Active Problems:   Rotator cuff tear  Estimated body mass index is 41.18 kg/(m^2) as calculated from the following:   Height as of this encounter: '5\' 9"'  (1.753 m).   Weight as of this encounter: 126.554 kg (279 lb).  Procedure:  Procedure(s) (LRB): RIGHT SHOULDER ROTATOR CUFF REPAIR OPEN WITH GRAFT AND ANCHOR  (Right)   Consults: None  HPI: The patient presented with the chief complaint of right shoulder pain after he fell and injured his right shoulder. He also developed decreased strength and ROM. MRI showed a torn rotator cuff.   Laboratory Data: Admission on 05/03/2015, Discharged on 05/04/2015  Component Date Value Ref Range Status  . Glucose-Capillary 05/03/2015 194* 65 - 99 mg/dL Final  . Comment 1 05/03/2015 Notify RN   Final  . Prothrombin Time 05/03/2015 19.0* 11.6 - 15.2 seconds Final  . INR 05/03/2015 1.59* 0.00 - 1.49 Final  Hospital Outpatient Visit on 04/29/2015  Component Date Value Ref Range Status  . Hgb A1c MFr Bld 04/29/2015 8.6* 4.8 - 5.6 % Final   Comment: (NOTE)         Pre-diabetes: 5.7 -  6.4         Diabetes: >6.4         Glycemic control for adults with diabetes: <7.0   . Mean Plasma Glucose 04/29/2015 200   Final   Comment: (NOTE) Performed At: [facility] [place], [place] [id number] Lindon Romp MD [id number]   . aPTT 04/29/2015 33  24 - 37 seconds Final  . WBC 04/29/2015 5.8  4.0 - 10.5 K/uL Final  . RBC 04/29/2015 4.87  4.22 - 5.81 MIL/uL Final  . Hemoglobin 04/29/2015 13.6  13.0 - 17.0 g/dL Final  . HCT 04/29/2015 41.2  39.0 - 52.0 % Final  . MCV 04/29/2015 84.6  78.0 - 100.0 fL Final  . MCH 04/29/2015 27.9  26.0 - 34.0 pg Final  . MCHC 04/29/2015 33.0  30.0 - 36.0 g/dL Final  . RDW 04/29/2015 15.0  11.5 - 15.5 % Final  . Platelets 04/29/2015 218  150 - 400 K/uL Final  . Neutrophils Relative % 04/29/2015 70   Final  . Neutro Abs 04/29/2015 4.1  1.7 - 7.7 K/uL Final  . Lymphocytes Relative 04/29/2015 15   Final  . Lymphs Abs 04/29/2015 0.9  0.7 - 4.0 K/uL Final  . Monocytes Relative 04/29/2015 8   Final  . Monocytes Absolute 04/29/2015  0.5  0.1 - 1.0 K/uL Final  . Eosinophils Relative 04/29/2015 7   Final  . Eosinophils Absolute 04/29/2015 0.4  0.0 - 0.7 K/uL Final  . Basophils Relative 04/29/2015 0   Final  . Basophils Absolute 04/29/2015 0.0  0.0 - 0.1 K/uL Final  . Sodium 04/29/2015 137  135 - 145 mmol/L Final  . Potassium 04/29/2015 4.7  3.5 - 5.1 mmol/L Final  . Chloride 04/29/2015 102  101 - 111 mmol/L Final  . CO2 04/29/2015 26  22 - 32 mmol/L Final  . Glucose, Bld 04/29/2015 271* 65 - 99 mg/dL Final  . BUN 04/29/2015 25* 6 - 20 mg/dL Final  . Creatinine, Ser 04/29/2015 1.55* 0.61 - 1.24 mg/dL Final  . Calcium 04/29/2015 8.9  8.9 - 10.3 mg/dL Final  . Total Protein 04/29/2015 6.4* 6.5 - 8.1 g/dL Final  . Albumin 04/29/2015 3.6  3.5 - 5.0 g/dL Final  . AST 04/29/2015 26  15 - 41 U/L Final  . ALT 04/29/2015 20  17 - 63 U/L Final  . Alkaline Phosphatase 04/29/2015 29* 38 - 126 U/L Final  . Total Bilirubin 04/29/2015  0.7  0.3 - 1.2 mg/dL Final  . GFR calc non Af Amer 04/29/2015 43* >60 mL/min Final  . GFR calc Af Amer 04/29/2015 50* >60 mL/min Final   Comment: (NOTE) The eGFR has been calculated using the CKD EPI equation. This calculation has not been validated in all clinical situations. eGFR's persistently <60 mL/min signify possible Chronic Kidney Disease.   . Anion gap 04/29/2015 9  5 - 15 Final  . Prothrombin Time 04/29/2015 25.8* 11.6 - 15.2 seconds Final  . INR 04/29/2015 2.49* 0.00 - 1.49 Final  . ABO/RH(D) 04/29/2015 A POS   Final  . Antibody Screen 04/29/2015 NEG   Final  . Sample Expiration 04/29/2015 05/06/2015   Final  . Extend sample reason 04/29/2015 NO TRANSFUSIONS OR PREGNANCY IN THE PAST 3 MONTHS   Final  Hospital Outpatient Visit on 04/29/2015  Component Date Value Ref Range Status  . Rest HR 04/29/2015 67   Final  . Rest BP 04/29/2015 142/103   Final  . Peak HR 04/29/2015 78   Final  . Peak BP 04/29/2015 147/103   Final  . TID 04/29/2015 1.07   Final  . SSS 04/29/2015 2   Final  . SRS 04/29/2015 2   Final  . SDS 04/29/2015 0   Final      EKG: Orders placed or performed in visit on 11/09/14  . EKG 12-Lead     Hospital Course: KY RUMPLE is a 75 y.o. who was admitted to [facility]. They were brought to the operating room on 05/03/2015 and underwent Procedure(s): RIGHT SHOULDER ROTATOR CUFF REPAIR OPEN WITH GRAFT AND ANCHOR .  Patient tolerated the procedure well and was later transferred to the recovery room and then to the orthopaedic floor for postoperative care.  They were given PO and IV analgesics for pain control following their surgery.  They were given 24 hours of postoperative antibiotics of  Anti-infectives    Start     Dose/Rate Route Frequency Ordered Stop   05/03/15 1800  ceFAZolin (ANCEF) IVPB 1 g/50 mL premix     1 g 100 mL/hr over 30 Minutes Intravenous Every 6 hours 05/03/15 1505 05/04/15 0640   05/03/15 0943  ceFAZolin (ANCEF) 3 g in  dextrose 5 % 50 mL IVPB     3 g 130 mL/hr over 30 Minutes  Intravenous On call to O.R. 05/03/15 4536 05/03/15 1145   05/03/15 0000  polymyxin B 500,000 Units, bacitracin 50,000 Units in sodium chloride irrigation 0.9 % 500 mL irrigation  Status:  Discontinued       As needed 05/03/15 1157 05/03/15 1302     and started on DVT prophylaxis in the form of Coumadin.   OT was ordered. Discharge planning consulted to help with postop disposition and equipment needs.  Patient had a good night on the evening of surgery.  Dressing was changed and the incision was clean and dry. Incision was healing well.  Patient was seen in rounds and was ready to go home.   Diet: Cardiac diet Activity:Keep sling on at all times Follow-up:in 2 weeks Disposition - Home Discharged Condition: stable   Discharge Instructions    Call MD / Call 911    Complete by:  As directed   If you experience chest pain or shortness of breath, CALL 911 and be transported to the hospital emergency room.  If you develope a fever above 101 F, pus (white drainage) or increased drainage or redness at the wound, or calf pain, call your surgeon's office.     Constipation Prevention    Complete by:  As directed   Drink plenty of fluids.  Prune juice may be helpful.  You may use a stool softener, such as Colace (over the counter) 100 mg twice a day.  Use MiraLax (over the counter) for constipation as needed.     Diet - low sodium heart healthy    Complete by:  As directed      Discharge instructions    Complete by:  As directed   Keep your sling on at all times, including sleeping in your sling. The only time you should remove your sling is to shower only but you need to keep your hand against your chest while you shower.  For the first few days, remove your dressing, tape a piece of saran wrap over your incision Take your shower, then remove the saran wrap and put a clean dressing on, then reapply your sling. After two days you can shower  without the saran wrap.  Call Dr. Gladstone Lighter if any wound complications or temperature of 101 degrees F or over.  Call the office for an appointment to see Dr. Gladstone Lighter in two weeks: [phone] and ask for Dr. Charlestine Night nurse, Brunilda Payor.     Driving restrictions    Complete by:  As directed   No driving     Increase activity slowly as tolerated    Complete by:  As directed      Lifting restrictions    Complete by:  As directed   No lifting            Medication List    STOP taking these medications        iron polysaccharides 150 MG capsule  Commonly known as:  NIFEREX      TAKE these medications        carvedilol 3.125 MG tablet  Commonly known as:  COREG  take 1 tablet by mouth twice a day with meals     fenofibrate 145 MG tablet  Commonly known as:  TRICOR  Take 145 mg by mouth daily.     ferrous sulfate 325 (65 FE) MG EC tablet  take 1 tablet by mouth once daily     furosemide 40 MG tablet  Commonly known as:  LASIX  take 1  tablet by mouth once daily     HYDROcodone-acetaminophen 5-325 MG tablet  Commonly known as:  NORCO/VICODIN  Take 1-2 tablets by mouth every 4 (four) hours as needed (breakthrough pain).     Niacin CR 1000 MG Tbcr  Take 1,000 mg by mouth at bedtime.     polyvinyl alcohol 1.4 % ophthalmic solution  Commonly known as:  LIQUIFILM TEARS  Place 1 drop into both eyes 2 (two) times daily as needed for dry eyes.     potassium chloride 10 MEQ tablet  Commonly known as:  K-DUR,KLOR-CON  take 1 tablet by mouth once daily     quinapril 20 MG tablet  Commonly known as:  ACCUPRIL  Take 20 mg by mouth daily.     warfarin 1 MG tablet  Commonly known as:  COUMADIN  Take 1 mg by mouth every Monday, Wednesday, and Friday.     warfarin 5 MG tablet  Commonly known as:  COUMADIN  Take 5-6 mg by mouth daily. 6 mg on Monday, Wednesday, and Friday.  Take 5 mg on Tuesday, Thursday, Saturday, and Sunday.           Follow-up Information    Follow  up with GIOFFRE,RONALD A, MD. Schedule an appointment as soon as possible for a visit in 2 weeks.   Specialty:  Orthopedic Surgery   Contact information:   [address] [place] [address] [phone]       Signed: Ardeen Jourdain, PA-C Orthopaedic Surgery 05/10/2015, 11:10 AM

## 2015-05-10 NOTE — Patient Instructions (Addendum)
Your physician wants you to follow-up in: 1 year You will receive a reminder letter in the mail two months in advance. If you don't receive a letter, please call our office to schedule the follow-up appointment.   Your physician has recommended you make the following change in your medication: STOP the niferex.

## 2015-05-11 ENCOUNTER — Encounter: Payer: Self-pay | Admitting: Cardiovascular Disease

## 2015-05-11 NOTE — Progress Notes (Signed)
Patient ID: [name], male   DOB: 1941/01/14, 75 y.o.   MRN: [id number]      HPI:  [name] is a 75 year old former patient of Dr. Terance Ice who establish cardiology care with me in October 2014.  He presents for follow-up cardiology evaluation after being given preoperative clearance and his rotator cuff surgery.  [name] has a complex medical history including severe exogenous obesity, venous insufficiency, history of prostate cancer and he is also status post left nephrectomy. He has had difficulty with hypertension. He has had significant problems with lower extremity edema and has documented venous insufficiency of both his right and left superficial saphenous veins. He has a remote history of DVT/PE and has been on anticoagulation therapy with Coumadin.  In 2009 he was referred for a sleep study which revealed severe obstructive sleep apnea with an AHI on the baseline portion of a split night protocol in excess of 100. Since 2009 he has been using CPAP therapy and has been on a 14 cm water pressure. Apparently, in September his machine no longer functioned. Within  a 10 day period without the unit he could not sleep well. He had a canceled numerous appointments due to marked residual daytime sleepiness and the sensation that he would fall asleep while driving. He was given a Metallurgist by [facility] since his prior DME [employer] SMS is no longer available. He was given a CPAP although unit and initial review of this short download indicates that this has only been titrated up to 12 cm and he had a residual AHI improved at 2.9. When I saw him in October, I referred him for a followup sleep study prior to obtaining a newly seen. The sleep study was done as a split night protocol on 12/26/2012. This again confirms severe obstructive sleep apnea with an HI overall of 84.9 per hour. He was unable to achieve REM sleep on the baseline portion of the study. Oxygen saturation  dropped to 83% with non-REM sleep. He was loud snoring. CPAP therapy was instituted and was titrated up to 16 cm water pressure which was the recommended pressure. He will be getting a new machine and will be referred to [facility] for this since they have provided him with the loaner unit prior to his most recent study.  In the past he has had progressive lower extremity swelling.  I discontinued his amlodipine which was undoubtedly playing a contributory role in his previous 3-4+ lower edema. His last echo Doppler study was in 2012 which showed moderate concentric left ventricular hypertrophy with an ejection fraction of greater than 55%. He did have mild pulmonary hypertension with estimated RV systolic pressure 39 mm. He did have aortic sclerosis without stenosis, trace mitral regurgitation, and trace tricuspid regurgitation. One is mostly consistent echo Doppler study of 12/16/2012 ejection fraction was 55-60%. There was mild mitral regurgitation, aortic sclerosis without stenosis. Estimated RV systolic pressure was normal at 23 mm.  [name] was in the hospital from  January 26 - February 7 and then again from February 27 through April 09, 2013 with urologic bleeding issues.   Last year, [name] obtained a new CPAP machine, ResMed AirSense 10 AutoSet model set at16 cm water pressure.  Review of download from 03/21/2013 through 04/19/2013 reveals a very good compliance with 97% of the days used, and 93% with days with use > 4 hours.  AHI is 1.7.  He is using an Water engineer mask and  is tolerating this well without significant leak.  He has a history of significant obesity and in January 2015 weighed almost 355 pounds.  He has lost over 70 pounds since that time.  He recently was in need for rotator cuff surgery by Dr. Randa Lynn free.  As a result, he underwent preoperative Lexiscan Myoview imaging, which was done on 04/29/2015.  This was a low risk study.  There was no evidence for ischemia.  The study  was not gated due to his atrial fibrillation.  He tolerated his surgery without cardiovascular compromise.  He is back on Coumadin therapy for his atrial fibrillation.  He denies any episodes of chest pain.  He continues to use his CPAP with 100% compliance.  Review of his medications indicates that he has been apparently taking both ferrous sulfate as well as Niferex.  He does note occasional leg swelling and often uses compression stockings.  He presents for evaluation.  Past Medical History  Diagnosis Date  . Obesity   . Phlebitis     Lower extermity  . Pulmonary emboli ([place]) 2008    submassive, saddle  . Prostate cancer ([place]) 07/2009  . Sleep apnea     on CPAP  . Hx of echocardiogram 12/04/2010    Normal EF >55% no significant valve disease  . History of stress test 06/27/2009    Low risk and EF of approximately 50%  . DVT (deep venous thrombosis) ([place])   . Chronic kidney disease, stage 3     baseline creatinine ~1.4  . HLD (hyperlipidemia)   . HTN (hypertension)   . Dysrhythmia     A fib  . Diabetes mellitus ([place])     diet controlled  . History of hiatal hernia     Past Surgical History  Procedure Laterality Date  . Nephrectomy  1999    for CA  . Cholecystectomy  1999  . Transurethral resection of bladder tumor N/A 03/04/2013    Procedure: CYSTOSCOPY WITH RIGHT RETROGRADE PYELOGRAM AND BLADDER BIOPSY /CLOT EVACUATION/ BIOPSY PROSTATIC URETHRA WITH FULGERATION ;  Surgeon: Molli Hazard, MD;  Location: [facility];  Service: Urology;  Laterality: N/A;  . Cystoscopy w/ retrogrades N/A 04/08/2013    Procedure: CYSTOSCOPY WITH CLOT EVACUATION;  Surgeon: Molli Hazard, MD;  Location: [facility];  Service: Urology;  Laterality: N/A;  . Left shoulder repair    . Shoulder open rotator cuff repair Right 05/03/2015    Procedure: RIGHT SHOULDER ROTATOR CUFF REPAIR OPEN WITH GRAFT AND ANCHOR ;  Surgeon: Latanya Maudlin, MD;  Location: [facility];  Service: Orthopedics;  Laterality: Right;     No Known Allergies  Current Outpatient Prescriptions  Medication Sig Dispense Refill  . carvedilol (COREG) 3.125 MG tablet take 1 tablet by mouth twice a day with meals 60 tablet 5  . fenofibrate (TRICOR) 145 MG tablet Take 145 mg by mouth daily.     . ferrous sulfate 325 (65 FE) MG EC tablet take 1 tablet by mouth once daily 30 tablet 6  . furosemide (LASIX) 40 MG tablet take 1 tablet by mouth once daily 30 tablet 6  . HYDROcodone-acetaminophen (NORCO/VICODIN) 5-325 MG tablet Take 1-2 tablets by mouth every 4 (four) hours as needed (breakthrough pain). 80 tablet 0  . Niacin CR 1000 MG TBCR Take 1,000 mg by mouth at bedtime.     . polyvinyl alcohol (LIQUIFILM TEARS) 1.4 % ophthalmic solution Place 1 drop into both eyes 2 (two) times daily as needed for dry  eyes.    . potassium chloride (K-DUR,KLOR-CON) 10 MEQ tablet take 1 tablet by mouth once daily 30 tablet 5  . quinapril (ACCUPRIL) 20 MG tablet Take 20 mg by mouth daily.   0  . warfarin (COUMADIN) 1 MG tablet Take 1 mg by mouth every Monday, Wednesday, and Friday.    . warfarin (COUMADIN) 5 MG tablet Take 5-6 mg by mouth daily. 6 mg on Monday, Wednesday, and Friday.  Take 5 mg on Tuesday, Thursday, Saturday, and Sunday.     No current facility-administered medications for this visit.    Social history is notable in that he is married. He works as a Freight forwarder in [employer]. He does   Travel for his job. He completed 12th grade. There is no recent tobacco or alcohol use.  Family History  Problem Relation Age of Onset  . Cancer Mother   . Diabetes Father   . Cancer Father    ROS General: Negative; No fevers, chills, or night sweats; positive for purposeful weight loss over 90 pounds over the last 2 years HEENT: Negative; No changes in vision or hearing, sinus congestion, difficulty swallowing Pulmonary: Negative; No cough, wheezing, shortness of breath, hemoptysis Cardiovascular: Negative; No chest pain,  presyncope, syncope, palpitations Positive for venous insufficiency GI: Negative; No nausea, vomiting, diarrhea, or abdominal pain GU: Negative; No dysuria, hematuria, or difficulty voiding Musculoskeletal: Status post recent R rotator cuff surgery Hematologic/Oncology: Negative; no easy bruising, bleeding Endocrine: Negative; no heat/cold intolerance; no diabetes Neuro: Negative; no changes in balance, headaches Skin: Negative; No rashes or skin lesions Psychiatric: Negative; No behavioral problems, depression Sleep: Positive for obstructive sleep apnea on CPAP.  No residual snoring, daytime sleepiness, hypersomnolence, bruxism, restless legs, hypnogognic hallucinations, no cataplexy Other comprehensive 14 point system review is negative.   PE BP 122/84 mmHg  Pulse 70  Ht '5\' 10"'$  (1.778 m)  Wt 279 lb 6.4 oz (126.735 kg)  BMI 40.09 kg/m2   Wt Readings from Last 3 Encounters:  05/10/15 279 lb 6.4 oz (126.735 kg)  05/03/15 279 lb (126.554 kg)  04/29/15 279 lb 3.2 oz (126.644 kg)   Weight in January 2015 was 355 pounds.  In October 2016 he had reduced his weight 265 pounds.  He ha now gained 14 pounds since that time.  General: Alert, oriented, no distress; morbidly obese  Skin: normal turgor, no rashes HEENT: Normocephalic, atraumatic. Pupils round and reactive; sclera anicteric; Fundi arteriolar narrowing. Nose without nasal septal hypertrophy Mouth/Parynx benign; Mallinpatti scale 3/4 Neck: No JVD, no carotid bruits; normal upstroke Chest: Nontender to palpation Lungs: clear to ausculatation and percussion; no wheezing or rales Heart: RRR, s1 s2 normal 2/6 systolic murmur in the aortic region and left lower border; no diastolic murmur.  No rubs, thrills or heaves Abdomen:  central adiposity;soft, nontender; no hepatosplenomehaly, BS+; abdominal aorta nontender and not dilated by palpation. Back: No CVA tenderness Pulses 2+ Extremities: Trace LE edema; no clubbing cyanosis,  Homan's sign negative  Neurologic: grossly nonfocal; no weakness. Psychological: Normal affect and mood.  No ECG was done today since he had just completed his nuclear study.  At that time, he did have atrial fibrillation, poor R-wave progression and nonspecific ST-T changes.  ECG (independently read by me): Atrial fibrillation at 60 bpm.  QTc interval 422 ms.  I reviewed an old ECG from 03/02/2013 revealed sinus rhythm.Marland Kitchen  LABS: BMP Latest Ref Rng 04/29/2015 04/04/2013 04/03/2013  Glucose 65 - 99 mg/dL 271(H) 141(H) 173(H)  BUN  6 - 20 mg/dL 25(H) 19 20  Creatinine 0.61 - 1.24 mg/dL 1.55(H) 1.61(H) 1.89(H)  Sodium 135 - 145 mmol/L 137 134(L) 132(L)  Potassium 3.5 - 5.1 mmol/L 4.7 4.2 4.4  Chloride 101 - 111 mmol/L 102 98 95(L)  CO2 22 - 32 mmol/L _0 Calcium 8.9 - 10.3 mg/dL 8.9 8.7 8.9   Hepatic Function Latest Ref Rng 04/29/2015 04/04/2013 07/17/2009  Total Protein 6.5 - 8.1 g/dL 6.4(L) 5.4(L) 6.1  Albumin 3.5 - 5.0 g/dL 3.6 2.7(L) 3.3(L)  AST 15 - 41 U/L _1 ALT 17 - 63 U/L _2 Alk Phosphatase 38 - 126 U/L 29(L) 26(L) 30(L)  Total Bilirubin 0.3 - 1.2 mg/dL 0.7 0.3 1.1  Bilirubin, Direct - - - -   CBC Latest Ref Rng 04/29/2015 04/09/2013 04/08/2013  WBC 4.0 - 10.5 K/uL 5.8 7.0 7.5  Hemoglobin 13.0 - 17.0 g/dL 13.6 8.7(L) 9.3(L)  Hematocrit 39.0 - 52.0 % 41.2 27.9(L) 29.4(L)  Platelets 150 - 400 K/uL 218 289 294   Lab Results  Component Value Date   MCV 84.6 04/29/2015   MCV 82.8 04/09/2013   MCV 81.9 04/08/2013   Lab Results  Component Value Date   TSH 1.316 04/04/2013   Lab Results  Component Value Date   HGBA1C 8.6* 04/29/2015   Lipid Panel     Component Value Date/Time   CHOL  01/21/2007 0450    110        ATP III CLASSIFICATION:  <200     mg/dL   Desirable  200-239  mg/dL   Borderline High  >=240    mg/dL   High   TRIG 369* 01/21/2007 0450   HDL 21* 01/21/2007 0450   CHOLHDL 5.2 01/21/2007 0450   VLDL 74* 01/21/2007 0450   LDLCALC  01/21/2007  0450    15        Total Cholesterol/HDL:CHD Risk Coronary Heart Disease Risk Table                     Men   Women  1/2 Average Risk   3.4   3.3   RADIOLOGY: No results found.   ASSESSMENT AND PLAN: [name] is a 75 year old male with a prior history of severe morbid obesity who has lost over 70 pounds over the past 2 years.  He has history of hypertension, venous insufficiency, prostate CA and is status post left nephrectomy.  He also has very severe obstructive sleep apnea  on CPAP therapy with a  16 cm water pressure.  He received a new ResMed AirSense 10 AutoSet machine last year and continues to tolerate this well and continues to have 100% compliance.  He is on chronic anticoagulation therapy due to prior history of DVT and PE.  He has  permanent atrial fibrillation.  Ventricular rate is well controlled today and is in the 70s.  I reviewed his recent nuclear perfusion study with him, which did not reveal any ischemia and remained low risk.  He was able to tolerate right rotator cuff surgery well without cardiovascular compromise.  He does have occasional peripheral edema and has been taking furosemide 40 mg daily.  I've suggested that on days where there is more swelling.  He can take an extra 20 mg in the afternoon.  He continues to be on quinipril 20 mg daily as well as carvedilol 3.125 mg twice a day for his blood pressure.  He does have  significant hyperlipidemia with triglyceride elevations the past and has been on fenofibrate/niacin combination.  He is on warfarin anticoagulation.  He denies bleeding.  I have recommended that he discontinue one of his supplemental iron therapy.  Dr. Harrington Villegas is following his pro times for his Coumadin.  As long as he remains stable, I will see him in one year for cardiology reevaluation.    Time spent: 25 minutes    Troy Sine, MD, [facility] 05/11/2015 5:36 PM

## 2015-05-27 ENCOUNTER — Emergency Department (HOSPITAL_COMMUNITY)
Admission: EM | Admit: 2015-05-27 | Discharge: 2015-05-28 | Disposition: A | Discharge status: Home / Self Care | Payer: BLUE CROSS/BLUE SHIELD | Attending: Emergency Medicine | Admitting: Emergency Medicine

## 2015-05-27 ENCOUNTER — Encounter (HOSPITAL_COMMUNITY): Payer: Self-pay | Admitting: Oncology

## 2015-05-27 DIAGNOSIS — T83098A Other mechanical complication of other indwelling urethral catheter, initial encounter: Secondary | ICD-10-CM | POA: Insufficient documentation

## 2015-05-27 DIAGNOSIS — Z7901 Long term (current) use of anticoagulants: Secondary | ICD-10-CM | POA: Insufficient documentation

## 2015-05-27 DIAGNOSIS — Z86711 Personal history of pulmonary embolism: Secondary | ICD-10-CM | POA: Diagnosis not present

## 2015-05-27 DIAGNOSIS — B372 Candidiasis of skin and nail: Secondary | ICD-10-CM | POA: Insufficient documentation

## 2015-05-27 DIAGNOSIS — Z9981 Dependence on supplemental oxygen: Secondary | ICD-10-CM | POA: Insufficient documentation

## 2015-05-27 DIAGNOSIS — I4891 Unspecified atrial fibrillation: Secondary | ICD-10-CM | POA: Insufficient documentation

## 2015-05-27 DIAGNOSIS — E119 Type 2 diabetes mellitus without complications: Secondary | ICD-10-CM | POA: Diagnosis not present

## 2015-05-27 DIAGNOSIS — T83091A Other mechanical complication of indwelling urethral catheter, initial encounter: Secondary | ICD-10-CM

## 2015-05-27 DIAGNOSIS — Z79899 Other long term (current) drug therapy: Secondary | ICD-10-CM | POA: Insufficient documentation

## 2015-05-27 DIAGNOSIS — Y658 Other specified misadventures during surgical and medical care: Secondary | ICD-10-CM | POA: Diagnosis not present

## 2015-05-27 DIAGNOSIS — Z8546 Personal history of malignant neoplasm of prostate: Secondary | ICD-10-CM | POA: Insufficient documentation

## 2015-05-27 DIAGNOSIS — N183 Chronic kidney disease, stage 3 (moderate): Secondary | ICD-10-CM | POA: Insufficient documentation

## 2015-05-27 DIAGNOSIS — Z86718 Personal history of other venous thrombosis and embolism: Secondary | ICD-10-CM | POA: Diagnosis not present

## 2015-05-27 DIAGNOSIS — I129 Hypertensive chronic kidney disease with stage 1 through stage 4 chronic kidney disease, or unspecified chronic kidney disease: Secondary | ICD-10-CM | POA: Insufficient documentation

## 2015-05-27 DIAGNOSIS — R600 Localized edema: Secondary | ICD-10-CM | POA: Insufficient documentation

## 2015-05-27 DIAGNOSIS — G4730 Sleep apnea, unspecified: Secondary | ICD-10-CM | POA: Insufficient documentation

## 2015-05-27 DIAGNOSIS — E669 Obesity, unspecified: Secondary | ICD-10-CM | POA: Diagnosis not present

## 2015-05-27 NOTE — ED Provider Notes (Signed)
CSN: [id number]     Arrival date & time 05/27/15  2330 History  By signing my name below, I, Emmanuella Mensah, attest that this documentation has been prepared under the direction and in the presence of Delora Fuel, MD. Electronically Signed: Judithann Sauger, ED Scribe. 05/27/2015. 11:59 PM.    Chief Complaint  Patient presents with  . Clogged Catheter    The history is provided by the patient. No language interpreter was used.   HPI Comments: [name] is a 75 y.o. male with a hx of DVT and DM who presents to the Emergency Department complaining of clogged catheter that stopped flowing earlier today. He reports that he tried to irrigate the catheter approx 4-5 times with no success at home. He explains that the catheter was placed approx. 2 weeks ago and he needs it because he has scar tissue from radiation during treatment for his prostate cancer. Pt presents with swollen bilateral feet but states that it is because he did not wear his compression socks today. No pain or any other complaints together. Pt normally gets his catheter changed every month.   PCP: Dr. Harrington Challenger   Past Medical History  Diagnosis Date  . Obesity   . Phlebitis     Lower extermity  . Pulmonary emboli ([place]) 2008    submassive, saddle  . Prostate cancer ([place]) 07/2009  . Sleep apnea     on CPAP  . Hx of echocardiogram 12/04/2010    Normal EF >55% no significant valve disease  . History of stress test 06/27/2009    Low risk and EF of approximately 50%  . DVT (deep venous thrombosis) ([place])   . Chronic kidney disease, stage 3     baseline creatinine ~1.4  . HLD (hyperlipidemia)   . HTN (hypertension)   . Dysrhythmia     A fib  . Diabetes mellitus ([place])     diet controlled  . History of hiatal hernia    Past Surgical History  Procedure Laterality Date  . Nephrectomy  1999    for CA  . Cholecystectomy  1999  . Transurethral resection of bladder tumor N/A 03/04/2013    Procedure: CYSTOSCOPY WITH RIGHT  RETROGRADE PYELOGRAM AND BLADDER BIOPSY /CLOT EVACUATION/ BIOPSY PROSTATIC URETHRA WITH FULGERATION ;  Surgeon: Molli Hazard, MD;  Location: [facility];  Service: Urology;  Laterality: N/A;  . Cystoscopy w/ retrogrades N/A 04/08/2013    Procedure: CYSTOSCOPY WITH CLOT EVACUATION;  Surgeon: Molli Hazard, MD;  Location: [facility];  Service: Urology;  Laterality: N/A;  . Left shoulder repair    . Shoulder open rotator cuff repair Right 05/03/2015    Procedure: RIGHT SHOULDER ROTATOR CUFF REPAIR OPEN WITH GRAFT AND ANCHOR ;  Surgeon: Latanya Maudlin, MD;  Location: [facility];  Service: Orthopedics;  Laterality: Right;   Family History  Problem Relation Age of Onset  . Cancer Mother   . Diabetes Father   . Cancer Father    Social History  Substance Use Topics  . Smoking status: Never Smoker   . Smokeless tobacco: Never Used  . Alcohol Use: 0.0 oz/week    0 Standard drinks or equivalent per week     Comment: occasional beer or wine    Review of Systems  Constitutional: Negative for fever.  Genitourinary:       Clogged catheter  Musculoskeletal: Negative for joint swelling and arthralgias.  Skin: Negative for rash and wound.  All other systems reviewed and are negative.  Allergies  Review of patient's allergies indicates no known allergies.  Home Medications   Prior to Admission medications   Medication Sig Start Date End Date Taking? Authorizing Provider  carvedilol (COREG) 3.125 MG tablet take 1 tablet by mouth twice a day with meals 01/25/15   Troy Sine, MD  fenofibrate (TRICOR) 145 MG tablet Take 145 mg by mouth daily.  10/18/12   Historical Provider, MD  ferrous sulfate 325 (65 FE) MG EC tablet take 1 tablet by mouth once daily 01/17/15   Troy Sine, MD  furosemide (LASIX) 40 MG tablet take 1 tablet by mouth once daily 01/10/15   Troy Sine, MD  HYDROcodone-acetaminophen (NORCO/VICODIN) 5-325 MG tablet Take 1-2 tablets by mouth every 4 (four) hours as  needed (breakthrough pain). 05/04/15   Amber Cecilio Asper, PA-C  Niacin CR 1000 MG TBCR Take 1,000 mg by mouth at bedtime.  11/06/12   Historical Provider, MD  polyvinyl alcohol (LIQUIFILM TEARS) 1.4 % ophthalmic solution Place 1 drop into both eyes 2 (two) times daily as needed for dry eyes.    Historical Provider, MD  potassium chloride (K-DUR,KLOR-CON) 10 MEQ tablet take 1 tablet by mouth once daily 01/25/15   Troy Sine, MD  quinapril (ACCUPRIL) 20 MG tablet Take 20 mg by mouth daily.  11/06/14   Historical Provider, MD  warfarin (COUMADIN) 1 MG tablet Take 1 mg by mouth every Monday, Wednesday, and Friday.    Historical Provider, MD  warfarin (COUMADIN) 5 MG tablet Take 5-6 mg by mouth daily. 6 mg on Monday, Wednesday, and Friday.  Take 5 mg on Tuesday, Thursday, Saturday, and Sunday. 11/13/12   Historical Provider, MD   BP 161/93 mmHg  Pulse 102  Temp(Src) 98.4 F (36.9 C) (Oral)  Resp 20  Ht 5\' 9"  (1.753 m)  Wt 266 lb (120.657 kg)  BMI 39.26 kg/m2  SpO2 98% Physical Exam  Constitutional: He is oriented to person, place, and time. He appears well-developed and well-nourished. No distress.  HENT:  Head: Normocephalic and atraumatic.  Eyes: Conjunctivae and EOM are normal. Pupils are equal, round, and reactive to light.  Neck: Normal range of motion. Neck supple. No JVD present.  Cardiovascular: Normal rate, regular rhythm and normal heart sounds.   No murmur heard. Pulmonary/Chest: Effort normal and breath sounds normal. He has no wheezes. He has no rales. He exhibits no tenderness.  Abdominal: Soft. Bowel sounds are normal. He exhibits no distension and no mass. There is no tenderness.  Musculoskeletal: Normal range of motion. He exhibits no edema.  1+ pitting edema bilateral   Lymphadenopathy:    He has no cervical adenopathy.  Neurological: He is alert and oriented to person, place, and time. No cranial nerve deficit. He exhibits normal muscle tone. Coordination normal.  Skin:  Skin is warm and dry.  Monilia rash present on the lower abdomen and proximal thigh where his pannus folds over.  Psychiatric: He has a normal mood and affect. His behavior is normal. Judgment and thought content normal.  Nursing note and vitals reviewed.   ED Course  Procedures (including critical care time) DIAGNOSTIC STUDIES: Oxygen Saturation is 98% on RA, normal by my interpretation.    COORDINATION OF CARE: 11:59 PM- Pt advised of plan for treatment and pt agrees. Pt will receive foley catheter change here.   MDM   Final diagnoses:  Obstructed Foley catheter, initial encounter ([place])  Moniliasis, cutaneous    Patient with chronic indwelling Foley catheter comes in  with obstructed catheter. Attempts to irrigate catheter were unsuccessful. A Foley catheter is removed and new one is inserted. Skin rash secondary to monilia-recommended he use over-the-counter antifungal medications. Old records are reviewed and he has no relevant past visits.  I personally performed the services described in this documentation, which was scribed in my presence. The recorded information has been reviewed and is accurate.     Delora Fuel, MD [id number]

## 2015-05-27 NOTE — ED Notes (Signed)
Pt has a chronic indwelling catheter.  Pt attempted to irrigate catheter w/o success.  Pt reports urine is spilling out around the catheter.  Last urine in bag was at 1600 today.

## 2015-05-28 ENCOUNTER — Encounter (HOSPITAL_COMMUNITY): Payer: Self-pay | Admitting: Oncology

## 2015-05-28 ENCOUNTER — Emergency Department (HOSPITAL_COMMUNITY)
Admission: EM | Admit: 2015-05-28 | Discharge: 2015-05-28 | Disposition: A | Discharge status: Home / Self Care | Payer: BLUE CROSS/BLUE SHIELD | Source: Home / Self Care | Attending: Emergency Medicine | Admitting: Emergency Medicine

## 2015-05-28 DIAGNOSIS — T83091D Other mechanical complication of indwelling urethral catheter, subsequent encounter: Secondary | ICD-10-CM

## 2015-05-28 NOTE — Discharge Instructions (Signed)
Foley Catheter Care, Adult °A Foley catheter is a soft, flexible tube that is placed into the bladder to drain urine. A Foley catheter may be inserted if: °· You leak urine or are not able to control when you urinate (urinary incontinence). °· You are not able to urinate when you need to (urinary retention). °· You had prostate surgery or surgery on the genitals. °· You have certain medical conditions, such as multiple sclerosis, dementia, or a spinal cord injury. °If you are going home with a Foley catheter in place, follow the instructions below. °TAKING CARE OF THE CATHETER °1. Wash your hands with soap and water. °2. Using mild soap and warm water on a clean washcloth: °¨ Clean the area on your body closest to the catheter insertion site using a circular motion, moving away from the catheter. Never wipe toward the catheter because this could sweep bacteria up into the urethra and cause infection. °¨ Remove all traces of soap. Pat the area dry with a clean towel. For males, reposition the foreskin. °3. Attach the catheter to your leg so there is no tension on the catheter. Use adhesive tape or a leg strap. If you are using adhesive tape, remove any sticky residue left behind by the previous tape you used. °4. Keep the drainage bag below the level of the bladder, but keep it off the floor. °5. Check throughout the day to be sure the catheter is working and urine is draining freely. Make sure the tubing does not become kinked. °6. Do not pull on the catheter or try to remove it. Pulling could damage internal tissues. °TAKING CARE OF THE DRAINAGE BAGS °You will be given two drainage bags to take home. One is a large overnight drainage bag, and the other is a smaller leg bag that fits underneath clothing. You may wear the overnight bag at any time, but you should never wear the smaller leg bag at night. Follow the instructions below for how to empty, change, and clean your drainage bags. °Emptying the Drainage  Bag °You must empty your drainage bag when it is  -½ full or at least 2-3 times a day. °1. Wash your hands with soap and water. °2. Keep the drainage bag below your hips, below the level of your bladder. This stops urine from going back into the tubing and into your bladder. °3. Hold the dirty bag over the toilet or a clean container. °4. Open the pour spout at the bottom of the bag and empty the urine into the toilet or container. Do not let the pour spout touch the toilet, container, or any other surface. Doing so can place bacteria on the bag, which can cause an infection. °5. Clean the pour spout with a gauze pad or cotton ball that has rubbing alcohol on it. °6. Close the pour spout. °7. Attach the bag to your leg with adhesive tape or a leg strap. °8. Wash your hands well. °Changing the Drainage Bag °Change your drainage bag once a month or sooner if it starts to smell bad or look dirty. Below are steps to follow when changing the drainage bag. °1. Wash your hands with soap and water. °2. Pinch off the rubber catheter so that urine does not spill out. °3. Disconnect the catheter tube from the drainage tube at the connection valve. Do not let the tubes touch any surface. °4. Clean the end of the catheter tube with an alcohol wipe. Use a different alcohol wipe to clean   the end of the drainage tube. °5. Connect the catheter tube to the drainage tube of the clean drainage bag. °6. Attach the new bag to the leg with adhesive tape or a leg strap. Avoid attaching the new bag too tightly. °7. Wash your hands well. °Cleaning the Drainage Bag °1. Wash your hands with soap and water. °2. Wash the bag in warm, soapy water. °3. Rinse the bag thoroughly with warm water. °4. Fill the bag with a solution of white vinegar and water (1 cup vinegar to 1 qt warm water [.2 L vinegar to 1 L warm water]). Close the bag and soak it for 30 minutes in the solution. °5. Rinse the bag with warm water. °6. Hang the bag to dry with the  pour spout open and hanging downward. °7. Store the clean bag (once it is dry) in a clean plastic bag. °8. Wash your hands well. °PREVENTING INFECTION °· Wash your hands before and after handling your catheter. °· Take showers daily and wash the area where the catheter enters your body. Do not take baths. Replace wet leg straps with dry ones, if this applies. °· Do not use powders, sprays, or lotions on the genital area. Only use creams, lotions, or ointments as directed by your caregiver. °· For females, wipe from front to back after each bowel movement. °· Drink enough fluids to keep your urine clear or pale yellow unless you have a fluid restriction. °· Do not let the drainage bag or tubing touch or lie on the floor. °· Wear cotton underwear to absorb moisture and to keep your skin drier. °SEEK MEDICAL CARE IF:  °· Your urine is cloudy or smells unusually bad. °· Your catheter becomes clogged. °· You are not draining urine into the bag or your bladder feels full. °· Your catheter starts to leak. °SEEK IMMEDIATE MEDICAL CARE IF:  °· You have pain, swelling, redness, or pus where the catheter enters the body. °· You have pain in the abdomen, legs, lower back, or bladder. °· You have a fever. °· You see blood fill the catheter, or your urine is pink or red. °· You have nausea, vomiting, or chills. °· Your catheter gets pulled out. °MAKE SURE YOU:  °· Understand these instructions. °· Will watch your condition. °· Will get help right away if you are not doing well or get worse. °  °This information is not intended to replace advice given to you by your health care provider. Make sure you discuss any questions you have with your health care provider. °  °Document Released: 01/22/2005 Document Revised: 06/08/2013 Document Reviewed: 01/14/2012 °Elsevier Interactive Patient Education ©2016 [employer] ° °

## 2015-05-28 NOTE — ED Notes (Signed)
Foley catheter pt had in place prior to arrival was removed from the pt without difficulty. Inserted 16Fr foley catheter without difficulty. Dark bloody urine returned. Pt tolerated well.

## 2015-05-28 NOTE — ED Notes (Signed)
Pt seen here tonight for the same.  Pt's chronic foley was replaced however since pt was d/c'd he has not had any urine output despite drinking.  Rates pain 6/10.

## 2015-05-28 NOTE — ED Provider Notes (Signed)
CSN: [id number]     Arrival date & time 05/28/15  0347 History   First MD Initiated Contact with Patient 05/28/15 0357     Chief Complaint  Patient presents with  . Clogged Catheter      (Consider location/radiation/quality/duration/timing/severity/associated sxs/prior Treatment) The history is provided by the patient.  75 year old male with chronic indwelling Foley catheter had been in the emergency department earlier tonight with occluded catheter which was changed. However, his urine drainage was bloody. Upon getting home, the catheter stopped draining and he started having suprapubic pressure. Of note, he is taking warfarin. Has history of atrial fibrillation as well as pulmonary emboli.  Past Medical History  Diagnosis Date  . Obesity   . Phlebitis     Lower extermity  . Pulmonary emboli ([place]) 2008    submassive, saddle  . Prostate cancer ([place]) 07/2009  . Sleep apnea     on CPAP  . Hx of echocardiogram 12/04/2010    Normal EF >55% no significant valve disease  . History of stress test 06/27/2009    Low risk and EF of approximately 50%  . DVT (deep venous thrombosis) ([place])   . Chronic kidney disease, stage 3     baseline creatinine ~1.4  . HLD (hyperlipidemia)   . HTN (hypertension)   . Dysrhythmia     A fib  . Diabetes mellitus ([place])     diet controlled  . History of hiatal hernia    Past Surgical History  Procedure Laterality Date  . Nephrectomy  1999    for CA  . Cholecystectomy  1999  . Transurethral resection of bladder tumor N/A 03/04/2013    Procedure: CYSTOSCOPY WITH RIGHT RETROGRADE PYELOGRAM AND BLADDER BIOPSY /CLOT EVACUATION/ BIOPSY PROSTATIC URETHRA WITH FULGERATION ;  Surgeon: Molli Hazard, MD;  Location: [facility];  Service: Urology;  Laterality: N/A;  . Cystoscopy w/ retrogrades N/A 04/08/2013    Procedure: CYSTOSCOPY WITH CLOT EVACUATION;  Surgeon: Molli Hazard, MD;  Location: [facility];  Service: Urology;  Laterality: N/A;  . Left shoulder  repair    . Shoulder open rotator cuff repair Right 05/03/2015    Procedure: RIGHT SHOULDER ROTATOR CUFF REPAIR OPEN WITH GRAFT AND ANCHOR ;  Surgeon: Latanya Maudlin, MD;  Location: [facility];  Service: Orthopedics;  Laterality: Right;   Family History  Problem Relation Age of Onset  . Cancer Mother   . Diabetes Father   . Cancer Father    Social History  Substance Use Topics  . Smoking status: Never Smoker   . Smokeless tobacco: Never Used  . Alcohol Use: 0.0 oz/week    0 Standard drinks or equivalent per week     Comment: occasional beer or wine    Review of Systems  All other systems reviewed and are negative.     Allergies  Review of patient's allergies indicates no known allergies.  Home Medications   Prior to Admission medications   Medication Sig Start Date End Date Taking? Authorizing Provider  carvedilol (COREG) 3.125 MG tablet take 1 tablet by mouth twice a day with meals 01/25/15   Troy Sine, MD  fenofibrate (TRICOR) 145 MG tablet Take 145 mg by mouth daily.  10/18/12   Historical Provider, MD  ferrous sulfate 325 (65 FE) MG EC tablet take 1 tablet by mouth once daily 01/17/15   Troy Sine, MD  furosemide (LASIX) 40 MG tablet take 1 tablet by mouth once daily 01/10/15   Troy Sine,  MD  HYDROcodone-acetaminophen (NORCO/VICODIN) 5-325 MG tablet Take 1-2 tablets by mouth every 4 (four) hours as needed (breakthrough pain). 05/04/15   Amber Cecilio Asper, PA-C  Niacin CR 1000 MG TBCR Take 1,000 mg by mouth at bedtime.  11/06/12   Historical Provider, MD  polyvinyl alcohol (LIQUIFILM TEARS) 1.4 % ophthalmic solution Place 1 drop into both eyes 2 (two) times daily as needed for dry eyes.    Historical Provider, MD  potassium chloride (K-DUR,KLOR-CON) 10 MEQ tablet take 1 tablet by mouth once daily 01/25/15   Troy Sine, MD  quinapril (ACCUPRIL) 20 MG tablet Take 20 mg by mouth daily.  11/06/14   Historical Provider, MD  warfarin (COUMADIN) 1 MG tablet Take 1 mg by  mouth every Monday, Wednesday, and Friday.    Historical Provider, MD  warfarin (COUMADIN) 5 MG tablet Take 5-6 mg by mouth daily. 6 mg on Monday, Wednesday, and Friday.  Take 5 mg on Tuesday, Thursday, Saturday, and Sunday. 11/13/12   Historical Provider, MD   BP 150/95 mmHg  Pulse 82  Temp(Src) 97.8 F (36.6 C) (Oral)  Resp 20  SpO2 95% Physical Exam  Nursing note and vitals reviewed.  75 year old male, resting comfortably and in no acute distress. Vital signs are significant for hypertension. Oxygen saturation is 95%, which is normal. Head is normocephalic and atraumatic. PERRLA, EOMI. Oropharynx is clear. Neck is nontender and supple without adenopathy or JVD. Back is nontender and there is no CVA tenderness. Lungs are clear without rales, wheezes, or rhonchi. Chest is nontender. Heart has regular rate and rhythm without murmur. Abdomen is soft, flat, nontender without masses or hepatosplenomegaly and peristalsis is normoactive. Extremities have 1+ edema, full range of motion is present. Skin is warm and dry. Rash present in the fold underneath abdominal pannus consistent with monilia. Neurologic: Mental status is normal, cranial nerves are intact, there are no motor or sensory deficits.  ED Course  Procedures (including critical care time)   MDM   Final diagnoses:  Obstructed Foley catheter, subsequent encounter    Obstructed Foley catheter. The catheter was irrigated and is draining slightly blood-tinged urine. He is discharged to continue routine Foley catheter care.    Delora Fuel, MD [id number]

## 2015-05-28 NOTE — ED Notes (Signed)
Red rash noted to bilateral groin and under abdominal fold.

## 2015-05-28 NOTE — ED Notes (Signed)
Irrigated foley cath with 30 cc NS  250 cc of dark red urine noted on return  Pt states he feels better  Tolerated well

## 2015-05-31 ENCOUNTER — Emergency Department (HOSPITAL_COMMUNITY): Payer: BLUE CROSS/BLUE SHIELD

## 2015-05-31 ENCOUNTER — Encounter (HOSPITAL_COMMUNITY): Payer: Self-pay | Admitting: Emergency Medicine

## 2015-05-31 ENCOUNTER — Inpatient Hospital Stay (HOSPITAL_COMMUNITY): Payer: BLUE CROSS/BLUE SHIELD

## 2015-05-31 ENCOUNTER — Inpatient Hospital Stay (HOSPITAL_COMMUNITY)
Admission: EM | Admit: 2015-05-31 | Discharge: 2015-07-31 | DRG: 853 | Disposition: A | Discharge status: Skilled Nursing Facility | Payer: BLUE CROSS/BLUE SHIELD | Attending: Internal Medicine | Admitting: Internal Medicine

## 2015-05-31 DIAGNOSIS — Z8546 Personal history of malignant neoplasm of prostate: Secondary | ICD-10-CM

## 2015-05-31 DIAGNOSIS — B370 Candidal stomatitis: Secondary | ICD-10-CM | POA: Diagnosis not present

## 2015-05-31 DIAGNOSIS — E1122 Type 2 diabetes mellitus with diabetic chronic kidney disease: Secondary | ICD-10-CM | POA: Diagnosis present

## 2015-05-31 DIAGNOSIS — Z9989 Dependence on other enabling machines and devices: Secondary | ICD-10-CM

## 2015-05-31 DIAGNOSIS — N2889 Other specified disorders of kidney and ureter: Secondary | ICD-10-CM | POA: Diagnosis not present

## 2015-05-31 DIAGNOSIS — D62 Acute posthemorrhagic anemia: Secondary | ICD-10-CM | POA: Diagnosis not present

## 2015-05-31 DIAGNOSIS — R0600 Dyspnea, unspecified: Secondary | ICD-10-CM

## 2015-05-31 DIAGNOSIS — R0989 Other specified symptoms and signs involving the circulatory and respiratory systems: Secondary | ICD-10-CM | POA: Diagnosis not present

## 2015-05-31 DIAGNOSIS — A4159 Other Gram-negative sepsis: Secondary | ICD-10-CM | POA: Diagnosis present

## 2015-05-31 DIAGNOSIS — E871 Hypo-osmolality and hyponatremia: Secondary | ICD-10-CM | POA: Diagnosis present

## 2015-05-31 DIAGNOSIS — I132 Hypertensive heart and chronic kidney disease with heart failure and with stage 5 chronic kidney disease, or end stage renal disease: Secondary | ICD-10-CM | POA: Diagnosis present

## 2015-05-31 DIAGNOSIS — J982 Interstitial emphysema: Secondary | ICD-10-CM | POA: Diagnosis not present

## 2015-05-31 DIAGNOSIS — E785 Hyperlipidemia, unspecified: Secondary | ICD-10-CM | POA: Diagnosis present

## 2015-05-31 DIAGNOSIS — I482 Chronic atrial fibrillation, unspecified: Secondary | ICD-10-CM | POA: Diagnosis present

## 2015-05-31 DIAGNOSIS — N170 Acute kidney failure with tubular necrosis: Secondary | ICD-10-CM | POA: Diagnosis present

## 2015-05-31 DIAGNOSIS — I480 Paroxysmal atrial fibrillation: Secondary | ICD-10-CM | POA: Diagnosis present

## 2015-05-31 DIAGNOSIS — I824Z1 Acute embolism and thrombosis of unspecified deep veins of right distal lower extremity: Secondary | ICD-10-CM | POA: Diagnosis present

## 2015-05-31 DIAGNOSIS — Z992 Dependence on renal dialysis: Secondary | ICD-10-CM | POA: Diagnosis not present

## 2015-05-31 DIAGNOSIS — M549 Dorsalgia, unspecified: Secondary | ICD-10-CM

## 2015-05-31 DIAGNOSIS — M7989 Other specified soft tissue disorders: Secondary | ICD-10-CM | POA: Diagnosis not present

## 2015-05-31 DIAGNOSIS — I10 Essential (primary) hypertension: Secondary | ICD-10-CM | POA: Diagnosis not present

## 2015-05-31 DIAGNOSIS — Z9689 Presence of other specified functional implants: Secondary | ICD-10-CM

## 2015-05-31 DIAGNOSIS — T797XXA Traumatic subcutaneous emphysema, initial encounter: Secondary | ICD-10-CM

## 2015-05-31 DIAGNOSIS — A414 Sepsis due to anaerobes: Secondary | ICD-10-CM | POA: Diagnosis present

## 2015-05-31 DIAGNOSIS — R509 Fever, unspecified: Secondary | ICD-10-CM | POA: Diagnosis not present

## 2015-05-31 DIAGNOSIS — I428 Other cardiomyopathies: Secondary | ICD-10-CM | POA: Diagnosis present

## 2015-05-31 DIAGNOSIS — E875 Hyperkalemia: Secondary | ICD-10-CM | POA: Diagnosis present

## 2015-05-31 DIAGNOSIS — A419 Sepsis, unspecified organism: Secondary | ICD-10-CM | POA: Diagnosis not present

## 2015-05-31 DIAGNOSIS — Z85528 Personal history of other malignant neoplasm of kidney: Secondary | ICD-10-CM

## 2015-05-31 DIAGNOSIS — G9341 Metabolic encephalopathy: Secondary | ICD-10-CM | POA: Diagnosis not present

## 2015-05-31 DIAGNOSIS — E872 Acidosis: Secondary | ICD-10-CM | POA: Diagnosis present

## 2015-05-31 DIAGNOSIS — R1310 Dysphagia, unspecified: Secondary | ICD-10-CM

## 2015-05-31 DIAGNOSIS — Y95 Nosocomial condition: Secondary | ICD-10-CM | POA: Diagnosis not present

## 2015-05-31 DIAGNOSIS — E8809 Other disorders of plasma-protein metabolism, not elsewhere classified: Secondary | ICD-10-CM | POA: Diagnosis present

## 2015-05-31 DIAGNOSIS — J9600 Acute respiratory failure, unspecified whether with hypoxia or hypercapnia: Secondary | ICD-10-CM

## 2015-05-31 DIAGNOSIS — D696 Thrombocytopenia, unspecified: Secondary | ICD-10-CM | POA: Diagnosis not present

## 2015-05-31 DIAGNOSIS — Z7901 Long term (current) use of anticoagulants: Secondary | ICD-10-CM | POA: Diagnosis not present

## 2015-05-31 DIAGNOSIS — Z905 Acquired absence of kidney: Secondary | ICD-10-CM | POA: Diagnosis not present

## 2015-05-31 DIAGNOSIS — N390 Urinary tract infection, site not specified: Secondary | ICD-10-CM | POA: Diagnosis present

## 2015-05-31 DIAGNOSIS — Z86711 Personal history of pulmonary embolism: Secondary | ICD-10-CM | POA: Diagnosis not present

## 2015-05-31 DIAGNOSIS — N189 Chronic kidney disease, unspecified: Secondary | ICD-10-CM

## 2015-05-31 DIAGNOSIS — E118 Type 2 diabetes mellitus with unspecified complications: Secondary | ICD-10-CM

## 2015-05-31 DIAGNOSIS — I481 Persistent atrial fibrillation: Secondary | ICD-10-CM | POA: Diagnosis not present

## 2015-05-31 DIAGNOSIS — N2581 Secondary hyperparathyroidism of renal origin: Secondary | ICD-10-CM | POA: Diagnosis present

## 2015-05-31 DIAGNOSIS — I998 Other disorder of circulatory system: Secondary | ICD-10-CM

## 2015-05-31 DIAGNOSIS — K661 Hemoperitoneum: Secondary | ICD-10-CM | POA: Diagnosis present

## 2015-05-31 DIAGNOSIS — R079 Chest pain, unspecified: Secondary | ICD-10-CM | POA: Diagnosis not present

## 2015-05-31 DIAGNOSIS — N19 Unspecified kidney failure: Secondary | ICD-10-CM

## 2015-05-31 DIAGNOSIS — IMO0002 Reserved for concepts with insufficient information to code with codable children: Secondary | ICD-10-CM | POA: Diagnosis present

## 2015-05-31 DIAGNOSIS — Z452 Encounter for adjustment and management of vascular access device: Secondary | ICD-10-CM

## 2015-05-31 DIAGNOSIS — J189 Pneumonia, unspecified organism: Secondary | ICD-10-CM | POA: Diagnosis not present

## 2015-05-31 DIAGNOSIS — D631 Anemia in chronic kidney disease: Secondary | ICD-10-CM | POA: Diagnosis present

## 2015-05-31 DIAGNOSIS — J9601 Acute respiratory failure with hypoxia: Secondary | ICD-10-CM

## 2015-05-31 DIAGNOSIS — R58 Hemorrhage, not elsewhere classified: Secondary | ICD-10-CM

## 2015-05-31 DIAGNOSIS — J90 Pleural effusion, not elsewhere classified: Secondary | ICD-10-CM | POA: Diagnosis present

## 2015-05-31 DIAGNOSIS — N185 Chronic kidney disease, stage 5: Secondary | ICD-10-CM | POA: Diagnosis not present

## 2015-05-31 DIAGNOSIS — T83511A Infection and inflammatory reaction due to indwelling urethral catheter, initial encounter: Secondary | ICD-10-CM

## 2015-05-31 DIAGNOSIS — N186 End stage renal disease: Secondary | ICD-10-CM | POA: Diagnosis present

## 2015-05-31 DIAGNOSIS — E669 Obesity, unspecified: Secondary | ICD-10-CM | POA: Diagnosis present

## 2015-05-31 DIAGNOSIS — Z0189 Encounter for other specified special examinations: Secondary | ICD-10-CM

## 2015-05-31 DIAGNOSIS — I959 Hypotension, unspecified: Secondary | ICD-10-CM | POA: Diagnosis present

## 2015-05-31 DIAGNOSIS — N178 Other acute kidney failure: Secondary | ICD-10-CM | POA: Diagnosis not present

## 2015-05-31 DIAGNOSIS — N183 Chronic kidney disease, stage 3 (moderate): Secondary | ICD-10-CM | POA: Diagnosis not present

## 2015-05-31 DIAGNOSIS — B961 Klebsiella pneumoniae [K. pneumoniae] as the cause of diseases classified elsewhere: Secondary | ICD-10-CM | POA: Diagnosis present

## 2015-05-31 DIAGNOSIS — E874 Mixed disorder of acid-base balance: Secondary | ICD-10-CM | POA: Diagnosis present

## 2015-05-31 DIAGNOSIS — E1165 Type 2 diabetes mellitus with hyperglycemia: Secondary | ICD-10-CM | POA: Diagnosis present

## 2015-05-31 DIAGNOSIS — R578 Other shock: Secondary | ICD-10-CM | POA: Diagnosis not present

## 2015-05-31 DIAGNOSIS — Z01818 Encounter for other preprocedural examination: Secondary | ICD-10-CM

## 2015-05-31 DIAGNOSIS — Z923 Personal history of irradiation: Secondary | ICD-10-CM

## 2015-05-31 DIAGNOSIS — S37019A Minor contusion of unspecified kidney, initial encounter: Secondary | ICD-10-CM

## 2015-05-31 DIAGNOSIS — N179 Acute kidney failure, unspecified: Secondary | ICD-10-CM

## 2015-05-31 DIAGNOSIS — J9811 Atelectasis: Secondary | ICD-10-CM | POA: Diagnosis not present

## 2015-05-31 DIAGNOSIS — Z4682 Encounter for fitting and adjustment of non-vascular catheter: Secondary | ICD-10-CM

## 2015-05-31 DIAGNOSIS — I2601 Septic pulmonary embolism with acute cor pulmonale: Secondary | ICD-10-CM | POA: Diagnosis not present

## 2015-05-31 DIAGNOSIS — E861 Hypovolemia: Secondary | ICD-10-CM | POA: Diagnosis present

## 2015-05-31 DIAGNOSIS — I5032 Chronic diastolic (congestive) heart failure: Secondary | ICD-10-CM | POA: Diagnosis present

## 2015-05-31 DIAGNOSIS — J95811 Postprocedural pneumothorax: Secondary | ICD-10-CM | POA: Diagnosis not present

## 2015-05-31 DIAGNOSIS — K5900 Constipation, unspecified: Secondary | ICD-10-CM | POA: Diagnosis not present

## 2015-05-31 DIAGNOSIS — R310 Gross hematuria: Secondary | ICD-10-CM | POA: Diagnosis not present

## 2015-05-31 DIAGNOSIS — N320 Bladder-neck obstruction: Secondary | ICD-10-CM | POA: Diagnosis present

## 2015-05-31 DIAGNOSIS — R52 Pain, unspecified: Secondary | ICD-10-CM

## 2015-05-31 DIAGNOSIS — G9340 Encephalopathy, unspecified: Secondary | ICD-10-CM | POA: Diagnosis not present

## 2015-05-31 DIAGNOSIS — E43 Unspecified severe protein-calorie malnutrition: Secondary | ICD-10-CM | POA: Diagnosis not present

## 2015-05-31 DIAGNOSIS — R579 Shock, unspecified: Secondary | ICD-10-CM | POA: Diagnosis not present

## 2015-05-31 DIAGNOSIS — Z4659 Encounter for fitting and adjustment of other gastrointestinal appliance and device: Secondary | ICD-10-CM

## 2015-05-31 DIAGNOSIS — I4891 Unspecified atrial fibrillation: Secondary | ICD-10-CM | POA: Diagnosis not present

## 2015-05-31 DIAGNOSIS — R6521 Severe sepsis with septic shock: Secondary | ICD-10-CM | POA: Diagnosis present

## 2015-05-31 DIAGNOSIS — I469 Cardiac arrest, cause unspecified: Secondary | ICD-10-CM | POA: Diagnosis not present

## 2015-05-31 DIAGNOSIS — G4733 Obstructive sleep apnea (adult) (pediatric): Secondary | ICD-10-CM | POA: Diagnosis present

## 2015-05-31 DIAGNOSIS — S270XXA Traumatic pneumothorax, initial encounter: Secondary | ICD-10-CM | POA: Diagnosis not present

## 2015-05-31 DIAGNOSIS — N10 Acute tubulo-interstitial nephritis: Secondary | ICD-10-CM | POA: Diagnosis not present

## 2015-05-31 DIAGNOSIS — R7881 Bacteremia: Secondary | ICD-10-CM | POA: Diagnosis not present

## 2015-05-31 DIAGNOSIS — E8729 Other acidosis: Secondary | ICD-10-CM | POA: Insufficient documentation

## 2015-05-31 DIAGNOSIS — J9690 Respiratory failure, unspecified, unspecified whether with hypoxia or hypercapnia: Secondary | ICD-10-CM

## 2015-05-31 DIAGNOSIS — I429 Cardiomyopathy, unspecified: Secondary | ICD-10-CM | POA: Diagnosis present

## 2015-05-31 DIAGNOSIS — Z6841 Body Mass Index (BMI) 40.0 and over, adult: Secondary | ICD-10-CM | POA: Diagnosis not present

## 2015-05-31 DIAGNOSIS — J939 Pneumothorax, unspecified: Secondary | ICD-10-CM

## 2015-05-31 DIAGNOSIS — N3090 Cystitis, unspecified without hematuria: Secondary | ICD-10-CM

## 2015-05-31 DIAGNOSIS — R109 Unspecified abdominal pain: Secondary | ICD-10-CM

## 2015-05-31 LAB — BASIC METABOLIC PANEL
Anion gap: 19 — ABNORMAL HIGH (ref 5–15)
Anion gap: 14 (ref 5–15)
Anion gap: 16 — ABNORMAL HIGH (ref 5–15)
BUN: 123 mg/dL — ABNORMAL HIGH (ref 6–20)
BUN: 114 mg/dL — ABNORMAL HIGH (ref 6–20)
BUN: 117 mg/dL — ABNORMAL HIGH (ref 6–20)
CALCIUM: 7.4 mg/dL — AB (ref 8.9–10.3)
CALCIUM: 7.3 mg/dL — AB (ref 8.9–10.3)
CHLORIDE: 99 mmol/L — AB (ref 101–111)
CO2: 14 mmol/L — ABNORMAL LOW (ref 22–32)
CO2: 16 mmol/L — AB (ref 22–32)
CO2: 14 mmol/L — ABNORMAL LOW (ref 22–32)
CREATININE: 9.07 mg/dL — AB (ref 0.61–1.24)
CREATININE: 9.41 mg/dL — AB (ref 0.61–1.24)
Calcium: 8.1 mg/dL — ABNORMAL LOW (ref 8.9–10.3)
Chloride: 91 mmol/L — ABNORMAL LOW (ref 101–111)
Chloride: 99 mmol/L — ABNORMAL LOW (ref 101–111)
Creatinine, Ser: 9.36 mg/dL — ABNORMAL HIGH (ref 0.61–1.24)
GFR calc Af Amer: 6 mL/min — ABNORMAL LOW (ref >60)
GFR calc Af Amer: 6 mL/min — ABNORMAL LOW (ref >60)
GFR calc non Af Amer: 5 mL/min — ABNORMAL LOW (ref >60)
GFR calc non Af Amer: 5 mL/min — ABNORMAL LOW (ref >60)
GFR calc non Af Amer: 5 mL/min — ABNORMAL LOW (ref >60)
GFR, EST AFRICAN AMERICAN: 6 mL/min — AB (ref >60)
GLUCOSE: 222 mg/dL — AB (ref 65–99)
Glucose, Bld: 411 mg/dL — ABNORMAL HIGH (ref 65–99)
Glucose, Bld: 186 mg/dL — ABNORMAL HIGH (ref 65–99)
Potassium: 5 mmol/L (ref 3.5–5.1)
Potassium: 5 mmol/L (ref 3.5–5.1)
Potassium: 4.2 mmol/L (ref 3.5–5.1)
SODIUM: 129 mmol/L — AB (ref 135–145)
Sodium: 124 mmol/L — ABNORMAL LOW (ref 135–145)
Sodium: 129 mmol/L — ABNORMAL LOW (ref 135–145)

## 2015-05-31 LAB — URINALYSIS, ROUTINE W REFLEX MICROSCOPIC
Glucose, UA: NEGATIVE mg/dL
Ketones, ur: 40 mg/dL — AB
Nitrite: POSITIVE — AB
Protein, ur: >300 mg/dL — AB
Specific Gravity, Urine: 1.021 (ref 1.005–1.030)
pH: 7.5 (ref 5.0–8.0)

## 2015-05-31 LAB — CBC WITH DIFFERENTIAL/PLATELET
Basophils Absolute: 0 10*3/uL (ref 0.0–0.1)
Basophils Relative: 0 %
Eosinophils Absolute: 0 10*3/uL (ref 0.0–0.7)
Eosinophils Relative: 0 %
HCT: 37 % — ABNORMAL LOW (ref 39.0–52.0)
Hemoglobin: 12.3 g/dL — ABNORMAL LOW (ref 13.0–17.0)
Lymphocytes Relative: 3 %
Lymphs Abs: 0.3 10*3/uL — ABNORMAL LOW (ref 0.7–4.0)
MCH: 27 pg (ref 26.0–34.0)
MCHC: 33.2 g/dL (ref 30.0–36.0)
MCV: 81.3 fL (ref 78.0–100.0)
Monocytes Absolute: 0.9 10*3/uL (ref 0.1–1.0)
Monocytes Relative: 9 %
Neutro Abs: 9.1 10*3/uL — ABNORMAL HIGH (ref 1.7–7.7)
Neutrophils Relative %: 88 %
Platelets: 131 10*3/uL — ABNORMAL LOW (ref 150–400)
RBC: 4.55 MIL/uL (ref 4.22–5.81)
RDW: 15.9 % — ABNORMAL HIGH (ref 11.5–15.5)
WBC Morphology: INCREASED
WBC: 10.3 10*3/uL (ref 4.0–10.5)

## 2015-05-31 LAB — PROTIME-INR
INR: >10 (ref 0.00–1.49)
INR: 7.08 (ref 0.00–1.49)
Prothrombin Time: >90 seconds — ABNORMAL HIGH (ref 11.6–15.2)
Prothrombin Time: 58.4 seconds — ABNORMAL HIGH (ref 11.6–15.2)

## 2015-05-31 LAB — I-STAT CG4 LACTIC ACID, ED
Lactic Acid, Venous: 3.19 mmol/L (ref 0.5–2.0)
Lactic Acid, Venous: 1.68 mmol/L (ref 0.5–2.0)

## 2015-05-31 LAB — GLUCOSE, CAPILLARY
GLUCOSE-CAPILLARY: 237 mg/dL — AB (ref 65–99)
Glucose-Capillary: 190 mg/dL — ABNORMAL HIGH (ref 65–99)

## 2015-05-31 LAB — TYPE AND SCREEN
ABO/RH(D): A POS
Antibody Screen: NEGATIVE

## 2015-05-31 LAB — CORTISOL: CORTISOL PLASMA: 96.6 ug/dL

## 2015-05-31 LAB — MRSA PCR SCREENING: MRSA by PCR: NEGATIVE

## 2015-05-31 LAB — ABO/RH: ABO/RH(D): A POS

## 2015-05-31 LAB — URINE MICROSCOPIC-ADD ON

## 2015-05-31 LAB — LACTIC ACID, PLASMA: LACTIC ACID, VENOUS: 2.6 mmol/L — AB (ref 0.5–2.0)

## 2015-05-31 MED ORDER — SODIUM CHLORIDE 0.9 % IV SOLN
INTRAVENOUS | Status: DC
  Administered 2015-05-31: 15:00:00 via INTRAVENOUS

## 2015-05-31 MED ORDER — VANCOMYCIN HCL 10 G IV SOLR
2250.0000 mg | Freq: Once | INTRAVENOUS | Status: DC
  Filled 2015-05-31: qty 2250

## 2015-05-31 MED ORDER — SODIUM CHLORIDE 0.9 % IV SOLN: 250.0000 mL | INTRAVENOUS | Status: DC | PRN

## 2015-05-31 MED ORDER — SODIUM CHLORIDE 0.9 % IV SOLN: INTRAVENOUS | Status: DC

## 2015-05-31 MED ORDER — SODIUM CHLORIDE 0.9 % IV BOLUS (SEPSIS)
3500.0000 mL | Freq: Once | INTRAVENOUS | Status: AC
  Administered 2015-05-31: 3500 mL via INTRAVENOUS

## 2015-05-31 MED ORDER — ONDANSETRON HCL 4 MG/2ML IJ SOLN: 4.0000 mg | Freq: Three times a day (TID) | INTRAMUSCULAR | Status: AC | PRN

## 2015-05-31 MED ORDER — INSULIN ASPART 100 UNIT/ML ~~LOC~~ SOLN
0.0000 [IU] | SUBCUTANEOUS | Status: DC
  Administered 2015-05-31: 5 [IU] via SUBCUTANEOUS
  Administered 2015-05-31: 3 [IU] via SUBCUTANEOUS
  Administered 2015-06-01: 5 [IU] via SUBCUTANEOUS
  Administered 2015-06-01 (×3): 3 [IU] via SUBCUTANEOUS
  Administered 2015-06-01: 5 [IU] via SUBCUTANEOUS
  Administered 2015-06-01: 8 [IU] via SUBCUTANEOUS
  Administered 2015-06-02: 5 [IU] via SUBCUTANEOUS
  Administered 2015-06-02: 8 [IU] via SUBCUTANEOUS
  Administered 2015-06-02: 5 [IU] via SUBCUTANEOUS

## 2015-05-31 MED ORDER — PANTOPRAZOLE SODIUM 40 MG IV SOLR
40.0000 mg | INTRAVENOUS | Status: DC
  Administered 2015-05-31 – 2015-06-01 (×2): 40 mg via INTRAVENOUS
  Filled 2015-05-31 (×3): qty 40

## 2015-05-31 MED ORDER — VANCOMYCIN HCL 10 G IV SOLR
2500.0000 mg | Freq: Once | INTRAVENOUS | Status: AC
  Administered 2015-05-31: 2500 mg via INTRAVENOUS
  Filled 2015-05-31: qty 2500

## 2015-05-31 MED ORDER — ACETAMINOPHEN 325 MG PO TABS
325.0000 mg | ORAL_TABLET | Freq: Four times a day (QID) | ORAL | Status: DC | PRN
  Administered 2015-06-01 (×2): 325 mg via ORAL
  Filled 2015-05-31 (×3): qty 1

## 2015-05-31 MED ORDER — SODIUM CHLORIDE 0.9 % IV BOLUS (SEPSIS)
500.0000 mL | Freq: Once | INTRAVENOUS | Status: AC
  Administered 2015-05-31: 500 mL via INTRAVENOUS

## 2015-05-31 MED ORDER — INSULIN ASPART 100 UNIT/ML ~~LOC~~ SOLN
10.0000 [IU] | Freq: Once | SUBCUTANEOUS | Status: AC
  Administered 2015-05-31: 10 [IU] via INTRAVENOUS
  Filled 2015-05-31: qty 1

## 2015-05-31 MED ORDER — PIPERACILLIN-TAZOBACTAM 3.375 G IVPB 30 MIN
3.3750 g | Freq: Once | INTRAVENOUS | Status: AC
  Administered 2015-05-31: 3.375 g via INTRAVENOUS
  Filled 2015-05-31: qty 50

## 2015-05-31 MED ORDER — SODIUM CHLORIDE 0.9 % IV BOLUS (SEPSIS)
1000.0000 mL | Freq: Once | INTRAVENOUS | Status: AC
  Administered 2015-05-31: 1000 mL via INTRAVENOUS

## 2015-05-31 MED ORDER — CEFTAZIDIME 2 G IJ SOLR
2.0000 g | INTRAMUSCULAR | Status: DC
  Administered 2015-05-31: 2 g via INTRAVENOUS
  Filled 2015-05-31 (×2): qty 2

## 2015-05-31 MED ORDER — SODIUM CHLORIDE 0.9 % IV SOLN
Freq: Once | INTRAVENOUS | Status: AC
  Administered 2015-05-31: 18:00:00 via INTRAVENOUS

## 2015-05-31 MED ORDER — HEPARIN SODIUM (PORCINE) 5000 UNIT/ML IJ SOLN: 5000.0000 [IU] | Freq: Three times a day (TID) | INTRAMUSCULAR | Status: DC

## 2015-05-31 NOTE — H&P (Signed)
PULMONARY / CRITICAL CARE MEDICINE   Name: [name] MRN: [id number] DOB: 03-09-1940    ADMISSION DATE:  05/31/2015 CONSULTATION DATE:  05/31/15  REFERRING MD:  EDP  CHIEF COMPLAINT:  Hypotension  HISTORY OF PRESENT ILLNESS:  [name] is a 75 y.o. male with PMH as outlined below including prostate CA s/p radiation 2011 and RCC s/p left nephrectomy.  He was seen in ED 4/22 for clogged catheter which was irrigated and replaced.  He apparently has had hematuria for quite some time which has been attributed to scarring from prior radiation.  He had been in his USOH until roughly 4/20 when he had decreased appetite.  He had not been eating or drinking for the past 5 days. On 04/25 he went to have his INR checked (on coumadin for A.fib and hx DVT / PE) and was found to have INR of 8 and was hypotensive with SBP 70/44.  He was subsequently sent to ED for further evaluation.  In ED, he was found to have UTI and despite 4.5L IVF, he remained hypotensive.  He also had multiple metabolic derangements including Na 124, K 5.0, AGMA, lactate 3, SCr 9.36. PCCM was therefore consulted for admission of septic shock due to urosepsis as well as AKI.    PAST MEDICAL HISTORY :  He  has a past medical history of Obesity; Phlebitis; Pulmonary emboli ([place]) (2008); Prostate cancer ([place]) (07/2009); Sleep apnea; echocardiogram (12/04/2010); History of stress test (06/27/2009); DVT (deep venous thrombosis) ([place]); Chronic kidney disease, stage 3; HLD (hyperlipidemia); HTN (hypertension); Dysrhythmia; Diabetes mellitus ([place]); and History of hiatal hernia.  PAST SURGICAL HISTORY: He  has past surgical history that includes Nephrectomy (1999); Cholecystectomy (1999); Transurethral resection of bladder tumor (N/A, 03/04/2013); Cystoscopy w/ retrogrades (N/A, 04/08/2013); Left shoulder repair; and Shoulder open rotator cuff repair (Right, 05/03/2015).  No Known Allergies  No current facility-administered medications  on file prior to encounter.   Current Outpatient Prescriptions on File Prior to Encounter  Medication Sig  . carvedilol (COREG) 3.125 MG tablet take 1 tablet by mouth twice a day with meals  . fenofibrate (TRICOR) 145 MG tablet Take 145 mg by mouth daily.   . ferrous sulfate 325 (65 FE) MG EC tablet take 1 tablet by mouth once daily  . furosemide (LASIX) 40 MG tablet take 1 tablet by mouth once daily  . HYDROcodone-acetaminophen (NORCO/VICODIN) 5-325 MG tablet Take 1-2 tablets by mouth every 4 (four) hours as needed (breakthrough pain).  . Niacin CR 1000 MG TBCR Take 1,000 mg by mouth at bedtime.   . polyvinyl alcohol (LIQUIFILM TEARS) 1.4 % ophthalmic solution Place 1 drop into both eyes 2 (two) times daily as needed for dry eyes.  . potassium chloride (K-DUR,KLOR-CON) 10 MEQ tablet take 1 tablet by mouth once daily  . quinapril (ACCUPRIL) 20 MG tablet Take 20 mg by mouth daily.   Marland Kitchen warfarin (COUMADIN) 1 MG tablet Take 1 mg by mouth every Monday, Wednesday, and Friday.  . warfarin (COUMADIN) 5 MG tablet Take 5-6 mg by mouth daily. 6 mg on Monday, Wednesday, and Friday.  Take 5 mg on Tuesday, Thursday, Saturday, and Sunday.    FAMILY HISTORY:  His indicated that his mother is deceased. He indicated that his father is deceased. He indicated that his maternal grandmother is deceased. He indicated that his maternal grandfather is deceased. He indicated that his paternal grandmother is deceased. He indicated that his paternal grandfather is deceased.   SOCIAL HISTORY: He  reports that he has never smoked. He has never used smokeless tobacco. He reports that he drinks alcohol. He reports that he does not use illicit drugs.  REVIEW OF SYSTEMS:   All negative; except for those that are bolded, which indicate positives.  Constitutional: weight loss, weight gain, night sweats, fevers, chills, fatigue, weakness.  HEENT: headaches, sore throat, sneezing, nasal congestion, post nasal drip, difficulty  swallowing, tooth/dental problems, visual complaints, visual changes, ear aches. Neuro: difficulty with speech, weakness, numbness, ataxia. CV:  chest pain, orthopnea, PND, swelling in lower extremities, dizziness, palpitations, syncope.  Resp: cough, hemoptysis, dyspnea, wheezing. GI  heartburn, indigestion, abdominal pain, nausea, vomiting, diarrhea, constipation, change in bowel habits, loss of appetite, hematemesis, melena, hematochezia.  GU: dysuria, change in color of urine, urgency or frequency, flank pain, hematuria. MSK: joint pain or swelling, decreased range of motion. Psych: change in mood or affect, depression, anxiety, suicidal ideations, homicidal ideations. Skin: rash, itching, bruising.   SUBJECTIVE:  Denies chest pain, SOB.  A&O x 3.    VITAL SIGNS: BP 77/53 mmHg  Pulse 60  Temp(Src) 97.9 F (36.6 C) (Rectal)  Resp 22  Wt 120.657 kg (266 lb)  SpO2 100%  HEMODYNAMICS:    VENTILATOR SETTINGS:    INTAKE / OUTPUT:     PHYSICAL EXAMINATION: General: Adult male, in NAD. Neuro: A&O x 3, non-focal.  HEENT: [place]/AT. PERRL, sclerae anicteric, MM dry. Cardiovascular: RRR, no M/R/G.  Lungs: Respirations even and unlabored.  CTA bilaterally, No W/R/R. Abdomen: BS x 4, soft, NT/ND.  Musculoskeletal: No gross deformities, no edema.  Skin: Intact, warm, no rashes.  LABS:  BMET  Recent Labs Lab 05/31/15 1055  NA 124*  K 5.0  CL 91*  CO2 14*  BUN 123*  CREATININE 9.36*  GLUCOSE 411*    Electrolytes  Recent Labs Lab 05/31/15 1055  CALCIUM 8.1*    CBC  Recent Labs Lab 05/31/15 1055  WBC 10.3  HGB 12.3*  HCT 37.0*  PLT 131*    Coag's No results for input(s): APTT, INR in the last 168 hours.  Sepsis Markers  Recent Labs Lab 05/31/15 1145  LATICACIDVEN 3.19*    ABG No results for input(s): PHART, PCO2ART, PO2ART in the last 168 hours.  Liver Enzymes No results for input(s): AST, ALT, ALKPHOS, BILITOT, ALBUMIN in the last 168  hours.  Cardiac Enzymes No results for input(s): TROPONINI, PROBNP in the last 168 hours.  Glucose No results for input(s): GLUCAP in the last 168 hours.  Imaging Dg Chest 2 View  05/31/2015  CLINICAL DATA:  75 year old male with hypotension concerning for sepsis EXAM: CHEST  2 VIEW COMPARISON:  Prior chest x-ray 03/18/2013 FINDINGS: Low inspiratory volumes with bibasilar atelectasis. There is a left lower lobe retrocardiac opacity resolving in a positive spine sign on the lateral view. Cardiac and mediastinal contours are enlarged but otherwise unchanged. Moderate hiatal hernia again noted. No pulmonary edema, pleural effusion or pneumothorax. No acute osseous abnormality. IMPRESSION: 1. Left lower lobe opacity may reflect atelectasis or infiltrate. Given the clinical history of sepsis, pneumonia is not excluded. 2. Low inspiratory volumes. 3. Moderate hiatal hernia. Electronically Signed   By: Jacqulynn Cadet M.D.   On: 05/31/2015 13:25     STUDIES:  CXR 04/25 > Low volumes. LLL opacity may reflect atelectasis or infiltrate. Renal US 04/25 >  CULTURES: Blood 04/25 > Urine 04/25 >  ANTIBIOTICS: Ceftaz 04/25 >  SIGNIFICANT EVENTS: 04/25 > admitted with septic shock due to urosepsis, AKI.  LINES/TUBES: CVL pending 04/25 >   DISCUSSION: 75 y.o. M with hx prostate CA s/p radiation 2011  ASSESSMENT / PLAN:  CARDIOVASCULAR A:  Shock - primarily septic due to urosepsis but also likely component of hypovolemic due to decreased PO intake over past 5 days. Hx a.fib (on coumadin), HLD, HTN. P:  Continue IVF's. Assess cortisol. Trend lactate. Assess troponin. May require CVL for vasopressors and CVP assessment (hold for now given elevated INR). 2u FFP. Hold outpatient carvedilol, fenofibrate, furosemide, niacin, quinapril, coumadin.  INFECTIOUS A:   Septic shock - due to urosepsis.  Has hx of E.coli and pseudomonas UTI in the past. P:   Abx as above (ceftaz).  Follow  cultures as above.  PULMONARY A: Acute hypoxic respiratory failure. Atelectasis. 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 Incentive spirometry. Hold coumadin given elevated INR (reportedly 8 on outpatient check 04/25). Heparin gtt once INR < 2. CXR in AM. Monitor for airway protection.  RENAL A:   AKI - pre-renal vs ATN. AGMA - due to lactate, AKI, uremia. Hyponatremia. Mild hyperkalemia - temporized in ED. P:   NS @ 125. Assess renal US. Repeat BMP now and in AM. Replace electrolytes as indicated. Renal has not been called since this seems pre renal. Flush foley, if not functioning will need to involve urology.  GASTROINTESTINAL A:   Obesity. Nutrition. P:   NPO. PPI.  HEMATOLOGIC / ONCOLOGIC A:   Supratherapeutic INR - reportedly 8 at outpatient check 04/25. Hx prostate CA s/p radiation 2011, RCC s/p left nephrectomy, DVT / PE (on coumadin). VTE Prophylaxis. P:  2u FFP then re-assess INR. SCD's / heparin gtt once INR < 2. CBC in AM.  ENDOCRINE A:   DM - diet controlled.   P:   SSI. Assess Hgb A1C.  NEUROLOGIC A:   No acute issues. P:   No interventions required.  Family updated: family updated by Dr. Nelda Marseille.  Interdisciplinary Family Meeting v Palliative Care Meeting:  Due by: 06/05/15.  CC time: 35 minutes.  Montey Hora, [facility] Pager: [phone]  or [phone] 05/31/2015, 2:41 PM  Attending Note:  75 year old male with prostate cancer s/p RT, developed hematuria, lost appetite and has not been eating or drinking for 5 days and was aneuric.  Presents to the hospital with AMS and SOB.  Noted to be in renal failure and acidotic.  Responded nicely to IVF, hypotensive but mental status is intact.  Also, INR is 8 today from the protime check as outpatient.  Will give two units FFP incase a central line is needed due to  hypotension.  Aggressive hydration.  Renal has not been called but will check a BMET at 6 PM if worsens then will call renal.  Renal U/S ordered.  Above note edited.  On exam, lungs are clear and patient is mentating well so will not start pressors unless mental status worsens.    The patient is critically ill with multiple organ systems failure and requires high complexity decision making for assessment and support, frequent evaluation and titration of therapies, application of advanced monitoring technologies and extensive interpretation of multiple databases.   Critical Care Time devoted to patient care services described in this note is  35  Minutes. This time reflects time of care of this signee Dr Jennet Maduro. This critical care  time does not reflect procedure time, or teaching time or supervisory time of PA/NP/Med student/Med Resident etc but could involve care discussion time.  Rush Farmer, M.D. [facility] Pulmonary/Critical Care Medicine. Pager: [phone]. After hours pager: [phone].

## 2015-05-31 NOTE — ED Provider Notes (Signed)
CSN: [id number]     Arrival date & time 05/31/15  1042 History   First MD Initiated Contact with Patient 05/31/15 1045     Chief Complaint  Patient presents with  . Hypotension     (Consider location/radiation/quality/duration/timing/severity/associated sxs/prior Treatment) HPI   75yM presenting with his wife for evaluation of generalized weakness, fatigue, anorexia. Onset middle of last week (Wed-Thur) and progressive since then. Now to point that cannot walk. Hx of prostate CA with chronic indwelling foley. Recent ED visits with urinary retention 2/2 blood clots. He currently feels like it is functioning fine. His output has decreased. He attributes this to not eating/drinking much at all for the past several days. Denies suprapubic pain he was having   Past Medical History  Diagnosis Date  . Obesity   . Phlebitis     Lower extermity  . Pulmonary emboli ([place]) 2008    submassive, saddle  . Prostate cancer ([place]) 07/2009  . Sleep apnea     on CPAP  . Hx of echocardiogram 12/04/2010    Normal EF >55% no significant valve disease  . History of stress test 06/27/2009    Low risk and EF of approximately 50%  . DVT (deep venous thrombosis) ([place])   . Chronic kidney disease, stage 3     baseline creatinine ~1.4  . HLD (hyperlipidemia)   . HTN (hypertension)   . Dysrhythmia     A fib  . Diabetes mellitus ([place])     diet controlled  . History of hiatal hernia    Past Surgical History  Procedure Laterality Date  . Nephrectomy  1999    for CA  . Cholecystectomy  1999  . Transurethral resection of bladder tumor N/A 03/04/2013    Procedure: CYSTOSCOPY WITH RIGHT RETROGRADE PYELOGRAM AND BLADDER BIOPSY /CLOT EVACUATION/ BIOPSY PROSTATIC URETHRA WITH FULGERATION ;  Surgeon: Molli Hazard, MD;  Location: [facility];  Service: Urology;  Laterality: N/A;  . Cystoscopy w/ retrogrades N/A 04/08/2013    Procedure: CYSTOSCOPY WITH CLOT EVACUATION;  Surgeon: Molli Hazard, MD;   Location: [facility];  Service: Urology;  Laterality: N/A;  . Left shoulder repair    . Shoulder open rotator cuff repair Right 05/03/2015    Procedure: RIGHT SHOULDER ROTATOR CUFF REPAIR OPEN WITH GRAFT AND ANCHOR ;  Surgeon: Latanya Maudlin, MD;  Location: [facility];  Service: Orthopedics;  Laterality: Right;   Family History  Problem Relation Age of Onset  . Cancer Mother   . Diabetes Father   . Cancer Father    Social History  Substance Use Topics  . Smoking status: Never Smoker   . Smokeless tobacco: Never Used  . Alcohol Use: 0.0 oz/week    0 Standard drinks or equivalent per week     Comment: occasional beer or wine    Review of Systems  All systems reviewed and negative, other than as noted in HPI.   Allergies  Review of patient's allergies indicates no known allergies.  Home Medications   Prior to Admission medications   Medication Sig Start Date End Date Taking? Authorizing Provider  carvedilol (COREG) 3.125 MG tablet take 1 tablet by mouth twice a day with meals 01/25/15   Troy Sine, MD  fenofibrate (TRICOR) 145 MG tablet Take 145 mg by mouth daily.  10/18/12   Historical Provider, MD  ferrous sulfate 325 (65 FE) MG EC tablet take 1 tablet by mouth once daily 01/17/15   Troy Sine, MD  furosemide (  LASIX) 40 MG tablet take 1 tablet by mouth once daily 01/10/15   Troy Sine, MD  HYDROcodone-acetaminophen (NORCO/VICODIN) 5-325 MG tablet Take 1-2 tablets by mouth every 4 (four) hours as needed (breakthrough pain). 05/04/15   Amber Cecilio Asper, PA-C  Niacin CR 1000 MG TBCR Take 1,000 mg by mouth at bedtime.  11/06/12   Historical Provider, MD  polyvinyl alcohol (LIQUIFILM TEARS) 1.4 % ophthalmic solution Place 1 drop into both eyes 2 (two) times daily as needed for dry eyes.    Historical Provider, MD  potassium chloride (K-DUR,KLOR-CON) 10 MEQ tablet take 1 tablet by mouth once daily 01/25/15   Troy Sine, MD  quinapril (ACCUPRIL) 20 MG tablet Take 20 mg by mouth  daily.  11/06/14   Historical Provider, MD  warfarin (COUMADIN) 1 MG tablet Take 1 mg by mouth every Monday, Wednesday, and Friday.    Historical Provider, MD  warfarin (COUMADIN) 5 MG tablet Take 5-6 mg by mouth daily. 6 mg on Monday, Wednesday, and Friday.  Take 5 mg on Tuesday, Thursday, Saturday, and Sunday. 11/13/12   Historical Provider, MD   BP 75/55 mmHg  Pulse 75  Temp(Src) 98 F (36.7 C) (Oral)  Resp 14  SpO2 98% Physical Exam  Constitutional: He appears well-developed and well-nourished. No distress.  HENT:  Head: Normocephalic and atraumatic.  Eyes: Conjunctivae are normal. Right eye exhibits no discharge. Left eye exhibits no discharge.  Neck: Neck supple.  Cardiovascular: Regular rhythm and normal heart sounds.  Exam reveals no gallop and no friction rub.   No murmur heard. Irregularly irregular  Pulmonary/Chest: Effort normal and breath sounds normal. No respiratory distress.  Abdominal: Soft. He exhibits no distension. There is no tenderness.  Genitourinary:  Foley. Minimal amount (less than 10cc) of urine/blood in leg bag. Intertrigo to medial thighs. Bladder does not seem distended and no suprapubic tenderness.   Musculoskeletal: He exhibits edema. He exhibits no tenderness.  RUE in sling. Surgical scars R shoulder. Symmetric LE edema.   Neurological: He is alert.  Skin: Skin is warm and dry.  Psychiatric: He has a normal mood and affect. His behavior is normal. Thought content normal.  Nursing note and vitals reviewed.   ED Course  Procedures (including critical care time)  CRITICAL CARE Performed by: Virgel Manifold Total critical care time: 35 minutes Critical care time was exclusive of separately billable procedures and treating other patients. Critical care was necessary to treat or prevent imminent or life-threatening deterioration. Critical care was time spent personally by me on the following activities: development of treatment plan with patient and/or  surrogate as well as nursing, discussions with consultants, evaluation of patient's response to treatment, examination of patient, obtaining history from patient or surrogate, ordering and performing treatments and interventions, ordering and review of laboratory studies, ordering and review of radiographic studies, pulse oximetry and re-evaluation of patient's condition.  Labs Review Labs Reviewed  CBC WITH DIFFERENTIAL/PLATELET - Abnormal; Notable for the following:    Hemoglobin 12.3 (*)    HCT 37.0 (*)    RDW 15.9 (*)    Platelets 131 (*)    Neutro Abs 9.1 (*)    Lymphs Abs 0.3 (*)    All other components within normal limits  BASIC METABOLIC PANEL - Abnormal; Notable for the following:    Sodium 124 (*)    Chloride 91 (*)    CO2 14 (*)    Glucose, Bld 411 (*)    BUN 123 (*)  Creatinine, Ser 9.36 (*)    Calcium 8.1 (*)    GFR calc non Af Amer 5 (*)    GFR calc Af Amer 6 (*)    Anion gap 19 (*)    All other components within normal limits  URINALYSIS, ROUTINE W REFLEX MICROSCOPIC (NOT AT [facility]) - Abnormal; Notable for the following:    Color, Urine RED (*)    APPearance TURBID (*)    Hgb urine dipstick LARGE (*)    Bilirubin Urine LARGE (*)    Ketones, ur 40 (*)    Protein, ur >300 (*)    Nitrite POSITIVE (*)    Leukocytes, UA LARGE (*)    All other components within normal limits  URINE MICROSCOPIC-ADD ON - Abnormal; Notable for the following:    Squamous Epithelial / LPF 0-5 (*)    Bacteria, UA MANY (*)    All other components within normal limits  I-STAT CG4 LACTIC ACID, ED - Abnormal; Notable for the following:    Lactic Acid, Venous 3.19 (*)    All other components within normal limits  CULTURE, BLOOD (ROUTINE X 2)  CULTURE, BLOOD (ROUTINE X 2)    Imaging Review Dg Chest 2 View  05/31/2015  CLINICAL DATA:  75 year old male with hypotension concerning for sepsis EXAM: CHEST  2 VIEW COMPARISON:  Prior chest x-ray 03/18/2013 FINDINGS: Low inspiratory volumes with  bibasilar atelectasis. There is a left lower lobe retrocardiac opacity resolving in a positive spine sign on the lateral view. Cardiac and mediastinal contours are enlarged but otherwise unchanged. Moderate hiatal hernia again noted. No pulmonary edema, pleural effusion or pneumothorax. No acute osseous abnormality. IMPRESSION: 1. Left lower lobe opacity may reflect atelectasis or infiltrate. Given the clinical history of sepsis, pneumonia is not excluded. 2. Low inspiratory volumes. 3. Moderate hiatal hernia. Electronically Signed   By: Jacqulynn Cadet M.D.   On: 05/31/2015 13:25   I have personally reviewed and evaluated these images and lab results as part of my medical decision-making.   EKG Interpretation None      MDM   Final diagnoses:  Septic shock ([place])  Urinary tract infection associated with catheterization of urinary tract, initial encounter  Renal failure    74yM who I suspect is septic. Fatigue/anorexia/general malaise. Urine most likely source. Also reports coughing after drinking recently. Aspiration pneumonia? Hypotensive and extremities cool to touch. Will start with fluid resuscitation. Empiric abx. Will need admission.   ->20% bands. UA consistent with UTI. Questionable pneumonia on CXR. Clinically I feel less likely but, as mentioned previously, has been coughing some. No respiratory distress. Zosyn/vanc previously ordered. -Marked renal impairment on baseline CKD. Has not had significant UOP since being in ED despite around 3L of IVF at this point. -Repeat abdominal exam without suprapubic tenderness or obviously palpable bladder.  Foley most likely source of potential obstruction if so. Will irrigate to assess patency. Denies flank/abdominal pain to suggest a more proximal obstruction. -Subjectively he reports starting to feel a little better and his color has improved from arrival but this is a very sick gentleman who remains persistently hypotensive at this point.  Will discuss with CCM.      Virgel Manifold, MD 06/08/15 1250

## 2015-05-31 NOTE — Progress Notes (Addendum)
Pharmacy Antibiotic Note  [name] is a 75 y.o. male admitted on 05/31/2015 with sepsis/UTI. Recently seen Friday in ED for catheter blockage/bleeding. Pharmacy has been consulted for ceftaz dosing. Given 1x dose of vanc and Zosyn 30 min infusion also ordered x 1. AF, SCr 9.36 on admit (recent baseline ~1.4-1.5), CrCl~7.  Plan: Ceftaz 2g IV q48h Monitor clinical progress, c/s, renal function, abx plan/LOT     Temp (24hrs), Avg:98 F (36.7 C), Min:98 F (36.7 C), Max:98 F (36.7 C)  No results for input(s): WBC, CREATININE, LATICACIDVEN, VANCOTROUGH, VANCOPEAK, VANCORANDOM, GENTTROUGH, GENTPEAK, GENTRANDOM, TOBRATROUGH, TOBRAPEAK, TOBRARND, AMIKACINPEAK, AMIKACINTROU, AMIKACIN in the last 168 hours.  CrCl cannot be calculated (Patient has no serum creatinine result on file.).    No Known Allergies  Antimicrobials this admission: 4/25 vanc x1 4/25 zosyn x1 4/25 ceftaz >>  Dose adjustments this admission: n/a  Microbiology results: 4/25 BCx:     Elicia Lamp, PharmD, BCPS Clinical Pharmacist Pager [phone] 05/31/2015 11:21 AM

## 2015-05-31 NOTE — ED Notes (Addendum)
Pt in from [place] office via [facility] with c/o hypotension, malaise and ams. Pt was seen in ED Friday, for chronic catheter blockage and bleeding. Catheter was replaced that day, and pt has felt weaker since. The next day, catheter had to be irrigated and many clots were present. At [place] office today, BP was 70/44 and pt was slightly lethargic, sats 92% on RA, and placed on 2L. Pt's temp was 98.67F. Pt was given 250 cc's of NS by EMS on ride over. Pt takes blood thinners, INR currently at 8.

## 2015-05-31 NOTE — Progress Notes (Signed)
CRITICAL VALUE ALERT  Critical value received:  PT >90 INR>10  Date of notification:  t  Time of notification:  1713  Critical value read back:Yes.    Nurse who received alert:  Loma Newton   MD notified (1st page):  Dr Nelda Marseille at bedside  Time of first page:  1530  MD notified (2nd page):  Time of second page:  Responding MD:  Dr Nelda Marseille  Time MD responded:  [phone]

## 2015-05-31 NOTE — Progress Notes (Signed)
CRITICAL VALUE ALERT  Critical value received:  PT 58.4; INR 7.08  Date of notification:  4.25.17  Time of notification:  2350  Critical value read back: Yes  Nurse who received alert:  Rito Ehrlich, RN  MD notified (1st page):  Dr. Juleen China  Time of first page:  2350  MD notified (2nd page):   Time of second page:  Responding MD:  Dr. Juleen China  Time MD responded:  2351

## 2015-05-31 NOTE — Progress Notes (Signed)
Received patient from ED .patient is alert and oriented X4 PERRl resp rate 26 dyspneic with turning saturating 100 on room air placed on nasal cannula ,afib 78 and BP 99/47

## 2015-05-31 NOTE — ED Notes (Signed)
CCM at bedside 

## 2015-05-31 NOTE — Progress Notes (Signed)
CRITICAL VALUE ALERT  Critical value received:  annaerobic bottle: gram negative rods  Date of notification:  4.25.17  Time of notification:  2320  Critical value read back: yes  Nurse who received alert:  Rito Ehrlich, RN  MD notified (1st page):  Dr. Juleen China  Time of first page:  2320  MD notified (2nd page): Dr. Juleen China  Time of second page:  2320  Responding MD:   Dr. Juleen China  Time MD responded:  2320

## 2015-06-01 ENCOUNTER — Inpatient Hospital Stay (HOSPITAL_COMMUNITY): Payer: BLUE CROSS/BLUE SHIELD

## 2015-06-01 DIAGNOSIS — N19 Unspecified kidney failure: Secondary | ICD-10-CM

## 2015-06-01 DIAGNOSIS — Z452 Encounter for adjustment and management of vascular access device: Secondary | ICD-10-CM | POA: Diagnosis present

## 2015-06-01 LAB — GLUCOSE, CAPILLARY
GLUCOSE-CAPILLARY: 184 mg/dL — AB (ref 65–99)
GLUCOSE-CAPILLARY: 246 mg/dL — AB (ref 65–99)
GLUCOSE-CAPILLARY: 229 mg/dL — AB (ref 65–99)
Glucose-Capillary: 172 mg/dL — ABNORMAL HIGH (ref 65–99)
Glucose-Capillary: 182 mg/dL — ABNORMAL HIGH (ref 65–99)
Glucose-Capillary: 291 mg/dL — ABNORMAL HIGH (ref 65–99)

## 2015-06-01 LAB — PROTIME-INR
INR: 7.47 — AB (ref 0.00–1.49)
INR: 2.79 — AB (ref 0.00–1.49)
Prothrombin Time: 60.8 seconds — ABNORMAL HIGH (ref 11.6–15.2)
Prothrombin Time: 29 seconds — ABNORMAL HIGH (ref 11.6–15.2)

## 2015-06-01 LAB — PREPARE FRESH FROZEN PLASMA
UNIT DIVISION: 0
UNIT DIVISION: 0

## 2015-06-01 LAB — CBC
HCT: 31.1 % — ABNORMAL LOW (ref 39.0–52.0)
Hemoglobin: 10.3 g/dL — ABNORMAL LOW (ref 13.0–17.0)
MCH: 26.7 pg (ref 26.0–34.0)
MCHC: 33.1 g/dL (ref 30.0–36.0)
MCV: 80.6 fL (ref 78.0–100.0)
PLATELETS: 124 10*3/uL — AB (ref 150–400)
RBC: 3.86 MIL/uL — ABNORMAL LOW (ref 4.22–5.81)
RDW: 16.2 % — ABNORMAL HIGH (ref 11.5–15.5)
WBC: 9.4 10*3/uL (ref 4.0–10.5)

## 2015-06-01 LAB — BASIC METABOLIC PANEL
Anion gap: 16 — ABNORMAL HIGH (ref 5–15)
BUN: 122 mg/dL — AB (ref 6–20)
CALCIUM: 7.2 mg/dL — AB (ref 8.9–10.3)
CO2: 13 mmol/L — ABNORMAL LOW (ref 22–32)
CREATININE: 9.33 mg/dL — AB (ref 0.61–1.24)
Chloride: 100 mmol/L — ABNORMAL LOW (ref 101–111)
GFR calc Af Amer: 6 mL/min — ABNORMAL LOW (ref >60)
GFR, EST NON AFRICAN AMERICAN: 5 mL/min — AB (ref >60)
GLUCOSE: 194 mg/dL — AB (ref 65–99)
Potassium: 4.3 mmol/L (ref 3.5–5.1)
Sodium: 129 mmol/L — ABNORMAL LOW (ref 135–145)

## 2015-06-01 LAB — DIC (DISSEMINATED INTRAVASCULAR COAGULATION)PANEL
INR: 2.53 — ABNORMAL HIGH (ref 0.00–1.49)
Platelets: 122 10*3/uL — ABNORMAL LOW (ref 150–400)
Smear Review: NONE SEEN

## 2015-06-01 LAB — URINE CULTURE

## 2015-06-01 LAB — DIC (DISSEMINATED INTRAVASCULAR COAGULATION) PANEL
APTT: 31 s (ref 24–37)
D DIMER QUANT: 2.76 ug{FEU}/mL — AB (ref 0.00–0.50)
PROTHROMBIN TIME: 26.9 s — AB (ref 11.6–15.2)

## 2015-06-01 LAB — MAGNESIUM: Magnesium: 2.1 mg/dL (ref 1.7–2.4)

## 2015-06-01 LAB — PHOSPHORUS: Phosphorus: 5.1 mg/dL — ABNORMAL HIGH (ref 2.5–4.6)

## 2015-06-01 LAB — BILIRUBIN, TOTAL: Total Bilirubin: 1.5 mg/dL — ABNORMAL HIGH (ref 0.3–1.2)

## 2015-06-01 LAB — BILIRUBIN, DIRECT: BILIRUBIN DIRECT: 0.5 mg/dL (ref 0.1–0.5)

## 2015-06-01 LAB — LACTATE DEHYDROGENASE: LDH: 167 U/L (ref 98–192)

## 2015-06-01 LAB — SAVE SMEAR

## 2015-06-01 MED ORDER — LIDOCAINE HCL (PF) 1 % IJ SOLN: 5.0000 mL | INTRAMUSCULAR | Status: DC | PRN

## 2015-06-01 MED ORDER — PENTAFLUOROPROP-TETRAFLUOROETH EX AERO: 1.0000 "application " | INHALATION_SPRAY | CUTANEOUS | Status: DC | PRN

## 2015-06-01 MED ORDER — HEPARIN SODIUM (PORCINE) 1000 UNIT/ML DIALYSIS: 1000.0000 [IU] | INTRAMUSCULAR | Status: DC | PRN

## 2015-06-01 MED ORDER — SODIUM CHLORIDE 0.9 % IV SOLN
Freq: Once | INTRAVENOUS | Status: AC
  Administered 2015-06-01: 12:00:00 via INTRAVENOUS

## 2015-06-01 MED ORDER — ALTEPLASE 2 MG IJ SOLR
2.0000 mg | Freq: Once | INTRAMUSCULAR | Status: DC | PRN
  Filled 2015-06-01: qty 2

## 2015-06-01 MED ORDER — STERILE WATER FOR INJECTION IV SOLN
150.0000 meq | Freq: Once | INTRAVENOUS | Status: AC
  Administered 2015-06-01: 150 meq via INTRAVENOUS
  Filled 2015-06-01 (×2): qty 850

## 2015-06-01 MED ORDER — SODIUM CHLORIDE 0.9 % IV SOLN: 100.0000 mL | INTRAVENOUS | Status: DC | PRN

## 2015-06-01 MED ORDER — VITAMIN K1 10 MG/ML IJ SOLN
10.0000 mg | Freq: Once | INTRAVENOUS | Status: AC
  Administered 2015-06-01: 10 mg via INTRAVENOUS
  Filled 2015-06-01: qty 1

## 2015-06-01 MED ORDER — STERILE WATER FOR INJECTION IV SOLN
150.0000 meq | INTRAVENOUS | Status: DC
  Filled 2015-06-01: qty 850

## 2015-06-01 MED ORDER — HEPARIN SODIUM (PORCINE) 1000 UNIT/ML DIALYSIS
1000.0000 [IU] | INTRAMUSCULAR | Status: DC | PRN
  Administered 2015-06-01: 3000 [IU] via INTRAVENOUS_CENTRAL
  Filled 2015-06-01: qty 6
  Filled 2015-06-01: qty 3
  Filled 2015-06-01: qty 6

## 2015-06-01 MED ORDER — HEPARIN SODIUM (PORCINE) 1000 UNIT/ML DIALYSIS: 20.0000 [IU]/kg | INTRAMUSCULAR | Status: DC | PRN

## 2015-06-01 MED ORDER — LIDOCAINE-PRILOCAINE 2.5-2.5 % EX CREA
1.0000 "application " | TOPICAL_CREAM | CUTANEOUS | Status: DC | PRN
  Filled 2015-06-01: qty 5

## 2015-06-01 NOTE — Progress Notes (Signed)
Dr Redmond Pulling informed of patient's INR 7.42 and PT 60.8 and Blood cultures + gm - rods and creatinine 9.3 and no urine output

## 2015-06-01 NOTE — Progress Notes (Signed)
Once patient's HD tx began, pt became tachycardic(HR-110-120s) with multiple PVCs, hypertensive(SBP-200s), hyperthermic(T-101.4), and tachyneic.  Patients sats dropped to 80s and patient began having several chills.  Patient placed on 2L  with sats increasing to 90s.  Patient given tylenol for fever and renal MD notified.  Orders given to HD RN to take off HD at this time.  Dr. Denton Brick also notified and came to see patient.  Blood cultures ordered.  Will monitor pt.

## 2015-06-01 NOTE — Progress Notes (Signed)
CRITICAL VALUE ALERT  Critical value received:  PT 58.4 INR 7.08  Date of notification:  4.26.17  Time of notification:  0640  Critical value read back: Yes  Nurse who received alert:  Rito Ehrlich, RN  MD notified (1st page):  Dr. Juleen China  Time of first page:  [phone]  MD notified (2nd page): [phone]  Time of second page:  Responding MD:  Dr Juleen China  Time MD responded:  [phone]

## 2015-06-01 NOTE — Progress Notes (Signed)
PULMONARY / CRITICAL CARE MEDICINE   Name: [name] MRN: [id number] DOB: 02-Oct-1940    ADMISSION DATE:  05/31/2015 CONSULTATION DATE:  05/31/15  REFERRING MD:  EDP  CHIEF COMPLAINT:  Hypotension  HISTORY OF PRESENT ILLNESS:  [name] is a 75 y.o. male with PMH as outlined below including prostate CA s/p radiation 2011 and RCC s/p left nephrectomy.  He was seen in ED 4/22 for clogged catheter which was irrigated and replaced.  He apparently has had hematuria for quite some time which has been attributed to scarring from prior radiation.  He had been in his USOH until roughly 4/20 when he had decreased appetite.  He had not been eating or drinking for the past 5 days. On 04/25 he went to have his INR checked (on coumadin for A.fib and hx DVT / PE) and was found to have INR of 8 and was hypotensive with SBP 70/44.  He was subsequently sent to ED for further evaluation.  In ED, he was found to have UTI and despite 4.5L IVF, he remained hypotensive.  He also had multiple metabolic derangements including Na 124, K 5.0, AGMA, lactate 3, SCr 9.36. PCCM was therefore consulted for admission of septic shock due to urosepsis as well as AKI.    No current facility-administered medications on file prior to encounter.   Current Outpatient Prescriptions on File Prior to Encounter  Medication Sig  . carvedilol (COREG) 3.125 MG tablet take 1 tablet by mouth twice a day with meals  . fenofibrate (TRICOR) 145 MG tablet Take 145 mg by mouth daily.   . ferrous sulfate 325 (65 FE) MG EC tablet take 1 tablet by mouth once daily  . furosemide (LASIX) 40 MG tablet take 1 tablet by mouth once daily  . HYDROcodone-acetaminophen (NORCO/VICODIN) 5-325 MG tablet Take 1-2 tablets by mouth every 4 (four) hours as needed (breakthrough pain).  . Niacin CR 1000 MG TBCR Take 1,000 mg by mouth at bedtime.   . polyvinyl alcohol (LIQUIFILM TEARS) 1.4 % ophthalmic solution Place 1 drop into both eyes 2 (two) times  daily as needed for dry eyes.  . potassium chloride (K-DUR,KLOR-CON) 10 MEQ tablet take 1 tablet by mouth once daily  . quinapril (ACCUPRIL) 20 MG tablet Take 20 mg by mouth daily.   Marland Kitchen warfarin (COUMADIN) 1 MG tablet Take 1 mg by mouth every Monday, Wednesday, and Friday.  . warfarin (COUMADIN) 5 MG tablet Take 5-6 mg by mouth daily. 6 mg on Monday, Wednesday, and Friday.  Take 5 mg on Tuesday, Thursday, Saturday, and Sunday.    SUBJECTIVE:  No acute events overnight. Patient seen and examined this AM with wife at bedside.  Reports intermittent episodes of hematuria in the past few years, most recently in the past week.  Also with decreased po but no vomiting in past 5 days.   Feels better since being admitted to hospital.  VITAL SIGNS: BP 84/50 mmHg  Pulse 68  Temp(Src) 98.1 F (36.7 C) (Oral)  Resp 21  Ht 5\' 10"  (1.778 m)  Wt 289 lb 3.9 oz (131.2 kg)  BMI 41.50 kg/m2  SpO2 97%  HEMODYNAMICS:    VENTILATOR SETTINGS:    INTAKE / OUTPUT: I/O last 3 completed shifts: In: 5128 [I.V.:4645; Blood:333; IV Piggyback:150] Out: 110 [Urine:110]   PHYSICAL EXAMINATION: General: Adult male sitting up in bed in NAD, mildly diaphortic Neuro: A&O x 3, MAE HEENT: [place]/AT Cardiovascular: RRR, no M/R/G.  Lungs: CTA B/L Abdomen: BS, soft,  NT/ND Musculoskeletal: trace B/L LE edema  Skin: Intact, warm, no rashes.  LABS:  BMET  Recent Labs Lab 05/31/15 1553 05/31/15 2251 06/01/15 0218  NA 129* 129* 129*  K 5.0 4.2 4.3  CL 99* 99* 100*  CO2 16* 14* 13*  BUN 114* 117* 122*  CREATININE 9.07* 9.41* 9.33*  GLUCOSE 222* 186* 194*    Electrolytes  Recent Labs Lab 05/31/15 1553 05/31/15 2251 06/01/15 0218  CALCIUM 7.4* 7.3* 7.2*  MG  --   --  2.1  PHOS  --   --  5.1*    CBC  Recent Labs Lab 05/31/15 1055 06/01/15 0218  WBC 10.3 9.4  HGB 12.3* 10.3*  HCT 37.0* 31.1*  PLT 131* 124*    Coag's  Recent Labs Lab 05/31/15 1553 05/31/15 2251 06/01/15 0218  INR  >10.00* 7.08* 7.47*    Sepsis Markers  Recent Labs Lab 05/31/15 1145 05/31/15 1352 05/31/15 1444  LATICACIDVEN 3.19* 2.6* 1.68    ABG No results for input(s): PHART, PCO2ART, PO2ART in the last 168 hours.  Liver Enzymes No results for input(s): AST, ALT, ALKPHOS, BILITOT, ALBUMIN in the last 168 hours.  Cardiac Enzymes No results for input(s): TROPONINI, PROBNP in the last 168 hours.  Glucose  Recent Labs Lab 05/31/15 1602 05/31/15 2004 05/31/15 2338 06/01/15 0330  GLUCAP 237* 190* 182* 184*    Imaging Dg Chest 2 View  05/31/2015  CLINICAL DATA:  75 year old male with hypotension concerning for sepsis EXAM: CHEST  2 VIEW COMPARISON:  Prior chest x-ray 03/18/2013 FINDINGS: Low inspiratory volumes with bibasilar atelectasis. There is a left lower lobe retrocardiac opacity resolving in a positive spine sign on the lateral view. Cardiac and mediastinal contours are enlarged but otherwise unchanged. Moderate hiatal hernia again noted. No pulmonary edema, pleural effusion or pneumothorax. No acute osseous abnormality. IMPRESSION: 1. Left lower lobe opacity may reflect atelectasis or infiltrate. Given the clinical history of sepsis, pneumonia is not excluded. 2. Low inspiratory volumes. 3. Moderate hiatal hernia. Electronically Signed   By: Jacqulynn Cadet M.D.   On: 05/31/2015 13:25   US Renal Port  06/01/2015  CLINICAL DATA:  Acute onset of septic shock.  Initial encounter. EXAM: RENAL / URINARY TRACT ULTRASOUND COMPLETE COMPARISON:  CT of the abdomen and pelvis performed 03/15/2014, and renal ultrasound performed 03/06/2013 FINDINGS: Right Kidney: Length: 17.6 cm. Echogenicity within normal limits. Compensatory nephromegaly is noted. A small 1.9 cm cyst is noted at the upper pole of the right kidney. No hydronephrosis visualized. Left Kidney: Status post left-sided nephrectomy. Bladder: Decompressed and not well assessed. IMPRESSION: 1. No evidence of hydronephrosis. 2. Small right  renal cyst noted. Electronically Signed   By: Garald Balding M.D.   On: 06/01/2015 01:42    STUDIES:  CXR 04/25 > Low volumes. LLL opacity may reflect atelectasis or infiltrate. Renal US 04/25 > surgically absent left kidney, no hydronephrosis, small right renal cyst.  CULTURES: Blood 04/25 > gram neg rods, cx pending Urine 04/25 > pending MRSA nares > negative  ANTIBIOTICS: Ceftaz 04/25 > Vanc 04/25 > 04/25  SIGNIFICANT EVENTS: 04/25 > admitted with septic shock due to UTI.  Significant AKI.  LINES/TUBES: PIV  DISCUSSION: 75 y.o. M with hx prostate CA s/p radiation 2011  ASSESSMENT / PLAN:  CARDIOVASCULAR A:  Shock - primarily 2/2 UTI.  Better with fluids.  Not on pressors. Hx a.fib (on coumadin), HLD, HTN. P:  Continue IVF's. Assess cortisol. Trend lactate. Assess troponin. May require CVL for vasopressors  and CVP assessment (hold for now given elevated INR). 2u FFP. Continue to hold outpatient carvedilol, fenofibrate, furosemide, niacin, quinapril, coumadin.  INFECTIOUS A:   Septic shock - likely urine source.  Has hx of E.coli and pseudomonas UTI in the past.  Gram neg rods growing in two sets of blood cx. P:   Abx as above (ceftaz).  D/c vanc since gram neg growing Follow-up cx.  PULMONARY A: Acute hypoxic respiratory failure. Atelectasis. 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 Incentive spirometry. Hold coumadin given elevated INR (reportedly 8 on outpatient check 04/25). Heparin gtt once INR < 2. CXR in AM. Monitor for airway protection.  RENAL A:   AKI - pre-renal vs ATN.  Poor po in recent days. No obstruction on renal US.  ~ [place] - due to lactate, AKI, uremia. Hyponatremia. Mild hyperkalemia - resolved Chronic foley w/hematuria at admission - improving, likely 2/2 infx; involve Urology if hematuria returns/persists despite trx of UTI P:   Continue  fluids Daily RFP Replace electrolytes prn Nephrology consulted given anuria, appreciate recs.  GASTROINTESTINAL A:   Obesity. Nutrition. P:   Clear liquid PPI for SUP  HEMATOLOGIC / ONCOLOGIC A:   Supratherapeutic INR - reportedly 8 at outpatient check 04/25; improving after FFP, vit K. Hx prostate CA s/p radiation 2011, RCC s/p left nephrectomy, DVT / PE (on coumadin). VTE Prophylaxis. P:  Repeat INR this AM SCD's / heparin gtt once INR < 2. Check DIC panel and hemolysis labs Daily CBC  ENDOCRINE A:   DM - diet controlled.  CBGs 182 - 246 in past 24 hours. P:   Continue SSI-M CBGs ac/hs  NEUROLOGIC A:   No acute issues. P:   No interventions required.  Family updated: wife updated at bedside  Interdisciplinary Family Meeting v Palliative Care Meeting:  Due by: 06/05/15.  Duwaine Maxin, DO [facility] PGY3 on PCCM Service [phone] 06/01/2015, 7:17 AM

## 2015-06-01 NOTE — Progress Notes (Signed)
CRITICAL VALUE ALERT  Critical value received:  Aerobic bottle: neg gram rods  Date of notification:  4.26.17  Time of notification:  0025  Critical value read back: Yes  Nurse who received alert:  Rito Ehrlich, RN  MD notified (1st page):  Dr. Juleen China  Time of first page:  0030  MD notified (2nd page):  Time of second page:  Responding MD:  Dr. Juleen China  Time MD responded:  [phone]

## 2015-06-01 NOTE — Procedures (Signed)
Hemodialysis Catheter Insertion Procedure Note [name] [id number] 09-26-1940  Procedure: Insertion of Hemodialysis Catheter Indications: Hemodialysis  Procedure Details Consent: Risks of procedure as well as the alternatives and risks of each were explained to the (patient/caregiver).  Consent for procedure obtained. Time Out: Verified patient identification, verified procedure, site/side was marked, verified correct patient position, special equipment/implants available, medications/allergies/relevent history reviewed, required imaging and test results available.  Performed  Maximum sterile technique was used including antiseptics, cap, gloves, gown, hand hygiene, mask and sheet. Skin prep: Chlorhexidine; local anesthetic administered A antimicrobial bonded/coated triple lumen catheter was placed in the right internal jugular vein using the Seldinger technique.  Evaluation Blood flow good Complications: No apparent complications Patient did tolerate procedure well. Chest X-ray ordered to verify placement.  CXR: pending.  Procedure performed under direct ultrasound guidance for real time vessel cannulation.      Montey Hora, [facility] Pgr: [phone]  or [phone] 06/01/2015, 2:19 PM

## 2015-06-01 NOTE — Consult Note (Signed)
[facility] Renal Consultation Note  Requesting MD: CCM Indication for Consultation: AKI  HPI:  [name] is a 75 y.o. 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 Patient was admitted to the ICU for septic shock 2/2 suspected UTI. Patient's Cr was notably elevated at 9.33, when his baseline was thought to be around 1.7-1.9. PT/INR was elevated. Blood cultures were positive for Gram Neg Rods.  Mr. And Mrs. Hippert report that he developed some hematuria and clogging of his foley catheter after he had some increased activity (getting in and out of a car multiple times) and had to replace his catheter due to clots (chronic indwelling foley) which preceded his presentation and likely was source of his urosepsis.  We have been consulted to further evaluate the development of AKI/CKD in the very nice man with a solitary functioning kidney and CKD stage 3 at baseline.  No h/o nephrotoxic agents or IV contrasted materials but was on an ACE-Inhibitor prior to admission.    CREATININE, SER  Date/Time Value Ref Range Status  06/01/2015 02:18 AM 9.33* 0.61 - 1.24 mg/dL Final  05/31/2015 10:51 PM 9.41* 0.61 - 1.24 mg/dL Final  05/31/2015 03:53 PM 9.07* 0.61 - 1.24 mg/dL Final  05/31/2015 10:55 AM 9.36* 0.61 - 1.24 mg/dL Final  04/29/2015 03:45 PM 1.55* 0.61 - 1.24 mg/dL Final  04/04/2013 05:20 AM 1.61* 0.50 - 1.35 mg/dL Final  04/03/2013 10:45 PM 1.89* 0.50 - 1.35 mg/dL Final  03/16/2013 06:30 PM 1.70* 0.50 - 1.35 mg/dL Final  03/12/2013 05:32 AM 1.46* 0.50 - 1.35 mg/dL Final  03/11/2013 04:55 AM 1.35 0.50 - 1.35 mg/dL Final  03/10/2013 04:50 AM 1.42* 0.50 - 1.35 mg/dL Final  03/09/2013 04:59 AM 1.78* 0.50 - 1.35 mg/dL Final  03/08/2013 05:50 AM 1.93* 0.50 - 1.35 mg/dL Final  03/07/2013 05:55 AM 2.82* 0.50 - 1.35 mg/dL Final  03/06/2013 04:00 AM 3.44* 0.50 - 1.35  mg/dL Final  03/05/2013 04:30 AM 2.06* 0.50 - 1.35 mg/dL Final  03/04/2013 05:05 AM 1.50* 0.50 - 1.35 mg/dL Final  03/03/2013 04:35 AM 1.53* 0.50 - 1.35 mg/dL Final  07/20/2009 04:50 AM 1.31 0.4 - 1.5 mg/dL Final  07/18/2009 03:30 AM 1.77* 0.4 - 1.5 mg/dL Final  07/17/2009 11:04 AM 1.75* 0.4 - 1.5 mg/dL Final  07/15/2009 10:57 PM 1.6* 0.4 - 1.5 mg/dL Final  01/24/2007 05:25 AM 1.39  Final  01/23/2007 05:30 AM 1.29  Final  01/21/2007 04:50 AM 1.18  Final  10/18/2006 05:10 AM 1.35  Final  10/17/2006 05:20 AM 1.21  Final  10/11/2006 03:00 PM 1.33  Final  10/11/2006 05:15 AM 1.43  Final  10/10/2006 06:48 PM 1.5  Final     PMHx:   Past Medical History  Diagnosis Date  . Obesity   . Phlebitis     Lower extermity  . Pulmonary emboli ([place]) 2008    submassive, saddle  . Prostate cancer ([place]) 07/2009  . Sleep apnea     on CPAP  . Hx of echocardiogram 12/04/2010    Normal EF >55% no significant valve disease  . History of stress test 06/27/2009    Low risk and EF of approximately 50%  . DVT (deep venous thrombosis) ([place])   . Chronic kidney disease, stage 3     baseline creatinine ~1.4  . HLD (hyperlipidemia)   . HTN (hypertension)   . Dysrhythmia  A fib  . Diabetes mellitus ([place])     diet controlled  . History of hiatal hernia     Past Surgical History  Procedure Laterality Date  . Nephrectomy  1999    for CA  . Cholecystectomy  1999  . Transurethral resection of bladder tumor N/A 03/04/2013    Procedure: CYSTOSCOPY WITH RIGHT RETROGRADE PYELOGRAM AND BLADDER BIOPSY /CLOT EVACUATION/ BIOPSY PROSTATIC URETHRA WITH FULGERATION ;  Surgeon: Molli Hazard, MD;  Location: [facility];  Service: Urology;  Laterality: N/A;  . Cystoscopy w/ retrogrades N/A 04/08/2013    Procedure: CYSTOSCOPY WITH CLOT EVACUATION;  Surgeon: Molli Hazard, MD;  Location: [facility];  Service: Urology;  Laterality: N/A;  . Left shoulder repair    . Shoulder open rotator cuff repair Right  05/03/2015    Procedure: RIGHT SHOULDER ROTATOR CUFF REPAIR OPEN WITH GRAFT AND ANCHOR ;  Surgeon: Latanya Maudlin, MD;  Location: [facility];  Service: Orthopedics;  Laterality: Right;    Family Hx:  Family History  Problem Relation Age of Onset  . Cancer Mother   . Diabetes Father   . Cancer Father     Social History:  reports that he has never smoked. He has never used smokeless tobacco. He reports that he drinks alcohol. He reports that he does not use illicit drugs.  Allergies: No Known Allergies  Medications: Prior to Admission medications   Medication Sig Start Date End Date Taking? Authorizing Provider  carvedilol (COREG) 3.125 MG tablet take 1 tablet by mouth twice a day with meals 01/25/15  Yes Troy Sine, MD  fenofibrate (TRICOR) 145 MG tablet Take 145 mg by mouth daily.  10/18/12  Yes Historical Provider, MD  ferrous sulfate 325 (65 FE) MG EC tablet take 1 tablet by mouth once daily 01/17/15  Yes Troy Sine, MD  furosemide (LASIX) 40 MG tablet take 1 tablet by mouth once daily 01/10/15  Yes Troy Sine, MD  HYDROcodone-acetaminophen (NORCO/VICODIN) 5-325 MG tablet Take 1-2 tablets by mouth every 4 (four) hours as needed (breakthrough pain). 05/04/15  Yes Amber Cecilio Asper, PA-C  Niacin CR 1000 MG TBCR Take 1,000 mg by mouth at bedtime.  11/06/12  Yes Historical Provider, MD  polyvinyl alcohol (LIQUIFILM TEARS) 1.4 % ophthalmic solution Place 1 drop into both eyes 2 (two) times daily as needed for dry eyes.   Yes Historical Provider, MD  potassium chloride (K-DUR,KLOR-CON) 10 MEQ tablet take 1 tablet by mouth once daily 01/25/15  Yes Troy Sine, MD  quinapril (ACCUPRIL) 20 MG tablet Take 20 mg by mouth daily.  11/06/14  Yes Historical Provider, MD  warfarin (COUMADIN) 1 MG tablet Take 1 mg by mouth every Monday, Wednesday, and Friday.   Yes Historical Provider, MD  warfarin (COUMADIN) 5 MG tablet Take 5-6 mg by mouth daily. 6 mg on Monday, Wednesday, and Friday.  Take 5 mg  on Tuesday, Thursday, Saturday, and Sunday. 11/13/12  Yes Historical Provider, MD    I have reviewed the patient's current medications.  Labs:  Results for orders placed or performed during the hospital encounter of 05/31/15 (from the past 48 hour(s))  Blood culture (routine x 2)     Status: None (Preliminary result)   Collection Time: 05/31/15 10:55 AM  Result Value Ref Range   Specimen Description BLOOD RIGHT HAND    Special Requests BOTTLES DRAWN AEROBIC AND ANAEROBIC 5CC    Culture  Setup Time      GRAM NEGATIVE RODS IN  BOTH AEROBIC AND ANAEROBIC BOTTLES CRITICAL RESULT CALLED TO, READ BACK BY AND VERIFIED WITH: J CLIFTON RN 2314 05/31/15 A BROWNING    Culture TOO YOUNG TO READ    Report Status PENDING   CBC with Differential     Status: Abnormal   Collection Time: 05/31/15 10:55 AM  Result Value Ref Range   WBC 10.3 4.0 - 10.5 K/uL   RBC 4.55 4.22 - 5.81 MIL/uL   Hemoglobin 12.3 (L) 13.0 - 17.0 g/dL   HCT 37.0 (L) 39.0 - 52.0 %   MCV 81.3 78.0 - 100.0 fL   MCH 27.0 26.0 - 34.0 pg   MCHC 33.2 30.0 - 36.0 g/dL   RDW 15.9 (H) 11.5 - 15.5 %   Platelets 131 (L) 150 - 400 K/uL   Neutrophils Relative % 88 %   Lymphocytes Relative 3 %   Monocytes Relative 9 %   Eosinophils Relative 0 %   Basophils Relative 0 %   Neutro Abs 9.1 (H) 1.7 - 7.7 K/uL   Lymphs Abs 0.3 (L) 0.7 - 4.0 K/uL   Monocytes Absolute 0.9 0.1 - 1.0 K/uL   Eosinophils Absolute 0.0 0.0 - 0.7 K/uL   Basophils Absolute 0.0 0.0 - 0.1 K/uL   WBC Morphology INCREASED BANDS (>20% BANDS)   Basic metabolic panel     Status: Abnormal   Collection Time: 05/31/15 10:55 AM  Result Value Ref Range   Sodium 124 (L) 135 - 145 mmol/L   Potassium 5.0 3.5 - 5.1 mmol/L    Comment: SLIGHT HEMOLYSIS   Chloride 91 (L) 101 - 111 mmol/L   CO2 14 (L) 22 - 32 mmol/L   Glucose, Bld 411 (H) 65 - 99 mg/dL   BUN 123 (H) 6 - 20 mg/dL   Creatinine, Ser 9.36 (H) 0.61 - 1.24 mg/dL   Calcium 8.1 (L) 8.9 - 10.3 mg/dL   GFR calc non Af Amer  5 (L) >60 mL/min   GFR calc Af Amer 6 (L) >60 mL/min    Comment: (NOTE) The eGFR has been calculated using the CKD EPI equation. This calculation has not been validated in all clinical situations. eGFR's persistently <60 mL/min signify possible Chronic Kidney Disease.    Anion gap 19 (H) 5 - 15  Blood culture (routine x 2)     Status: None (Preliminary result)   Collection Time: 05/31/15 11:35 AM  Result Value Ref Range   Specimen Description BLOOD RIGHT HAND    Special Requests BOTTLES DRAWN AEROBIC ONLY 5CC    Culture  Setup Time      GRAM NEGATIVE RODS AEROBIC BOTTLE ONLY CRITICAL RESULT CALLED TO, READ BACK BY AND VERIFIED WITH: M TOLER,RN '@0026'  06/01/15 MKELLY    Culture TOO YOUNG TO READ    Report Status PENDING   I-Stat CG4 Lactic Acid, ED     Status: Abnormal   Collection Time: 05/31/15 11:45 AM  Result Value Ref Range   Lactic Acid, Venous 3.19 (HH) 0.5 - 2.0 mmol/L   Comment NOTIFIED PHYSICIAN   Urinalysis, Routine w reflex microscopic (not at [facility])     Status: Abnormal   Collection Time: 05/31/15 11:58 AM  Result Value Ref Range   Color, Urine RED (A) YELLOW    Comment: BIOCHEMICALS MAY BE AFFECTED BY COLOR   APPearance TURBID (A) CLEAR   Specific Gravity, Urine 1.021 1.005 - 1.030   pH 7.5 5.0 - 8.0   Glucose, UA NEGATIVE NEGATIVE mg/dL   Hgb urine dipstick LARGE (  A) NEGATIVE   Bilirubin Urine LARGE (A) NEGATIVE   Ketones, ur 40 (A) NEGATIVE mg/dL   Protein, ur >300 (A) NEGATIVE mg/dL   Nitrite POSITIVE (A) NEGATIVE   Leukocytes, UA LARGE (A) NEGATIVE  Urine microscopic-add on     Status: Abnormal   Collection Time: 05/31/15 11:58 AM  Result Value Ref Range   Squamous Epithelial / LPF 0-5 (A) NONE SEEN   WBC, UA 6-30 0 - 5 WBC/hpf   RBC / HPF TOO NUMEROUS TO COUNT 0 - 5 RBC/hpf   Bacteria, UA MANY (A) NONE SEEN  Urine culture     Status: Abnormal   Collection Time: 05/31/15 11:58 AM  Result Value Ref Range   Specimen Description URINE, RANDOM    Special  Requests NONE    Culture MULTIPLE SPECIES PRESENT, SUGGEST RECOLLECTION (A)    Report Status 06/01/2015 FINAL   Lactic acid, plasma     Status: Abnormal   Collection Time: 05/31/15  1:52 PM  Result Value Ref Range   Lactic Acid, Venous 2.6 (HH) 0.5 - 2.0 mmol/L    Comment: CRITICAL RESULT CALLED TO, READ BACK BY AND VERIFIED WITH: D MCKINNEY,RN 1712 05/31/2015 WBOND   ABO/Rh     Status: None   Collection Time: 05/31/15  2:25 PM  Result Value Ref Range   ABO/RH(D) A POS   Prepare fresh frozen plasma     Status: None   Collection Time: 05/31/15  2:41 PM  Result Value Ref Range   Unit Number [id number]    Blood Component Type THAWED PLASMA    Unit division 00    Status of Unit ISSUED,FINAL    Transfusion Status OK TO TRANSFUSE    Unit Number [id number]    Blood Component Type THAWED PLASMA    Unit division 00    Status of Unit ISSUED,FINAL    Transfusion Status OK TO TRANSFUSE   Type and screen [place]     Status: None   Collection Time: 05/31/15  2:41 PM  Result Value Ref Range   ABO/RH(D) A POS    Antibody Screen NEG    Sample Expiration 06/03/2015   I-Stat CG4 Lactic Acid, ED     Status: None   Collection Time: 05/31/15  2:44 PM  Result Value Ref Range   Lactic Acid, Venous 1.68 0.5 - 2.0 mmol/L  MRSA PCR Screening     Status: None   Collection Time: 05/31/15  3:44 PM  Result Value Ref Range   MRSA by PCR NEGATIVE NEGATIVE    Comment:        The GeneXpert MRSA Assay (FDA approved for NASAL specimens only), is one component of a comprehensive MRSA colonization surveillance program. It is not intended to diagnose MRSA infection nor to guide or monitor treatment for MRSA infections.   Protime-INR     Status: Abnormal   Collection Time: 05/31/15  3:53 PM  Result Value Ref Range   Prothrombin Time >90.0 (H) 11.6 - 15.2 seconds    Comment: SPECIMEN CHECKED FOR CLOTS   INR >10.00 (HH) 0.00 - 1.49    Comment: SPECIMEN CHECKED FOR  CLOTS REPEATED TO VERIFY CRITICAL RESULT CALLED TO, READ BACK BY AND VERIFIED WITH: P MCFINNEY,RN AT 1716 05/31/15 BY K BARR   Basic metabolic panel     Status: Abnormal   Collection Time: 05/31/15  3:53 PM  Result Value Ref Range   Sodium 129 (L) 135 - 145 mmol/L   Potassium 5.0  3.5 - 5.1 mmol/L   Chloride 99 (L) 101 - 111 mmol/L   CO2 16 (L) 22 - 32 mmol/L   Glucose, Bld 222 (H) 65 - 99 mg/dL   BUN 114 (H) 6 - 20 mg/dL   Creatinine, Ser 9.07 (H) 0.61 - 1.24 mg/dL   Calcium 7.4 (L) 8.9 - 10.3 mg/dL   GFR calc non Af Amer 5 (L) >60 mL/min   GFR calc Af Amer 6 (L) >60 mL/min    Comment: (NOTE) The eGFR has been calculated using the CKD EPI equation. This calculation has not been validated in all clinical situations. eGFR's persistently <60 mL/min signify possible Chronic Kidney Disease.    Anion gap 14 5 - 15  Cortisol     Status: None   Collection Time: 05/31/15  3:53 PM  Result Value Ref Range   Cortisol, Plasma 96.6 ug/dL    Comment: RESULTS CONFIRMED BY MANUAL DILUTION (NOTE) AM    6.7 - 22.6 ug/dL PM   <10.0       ug/dL   Glucose, capillary     Status: Abnormal   Collection Time: 05/31/15  4:02 PM  Result Value Ref Range   Glucose-Capillary 237 (H) 65 - 99 mg/dL  Culture, blood (routine x 2)     Status: None (Preliminary result)   Collection Time: 05/31/15  4:05 PM  Result Value Ref Range   Specimen Description BLOOD RIGHT HAND    Special Requests IN PEDIATRIC BOTTLE 2CC    Culture  Setup Time      GRAM NEGATIVE RODS AEROBIC BOTTLE ONLY CRITICAL RESULT CALLED TO, READ BACK BY AND VERIFIED WITH: M TOLER,RN '@0617'  06/01/15 MKELLY    Culture TOO YOUNG TO READ    Report Status PENDING   Culture, blood (routine x 2)     Status: None (Preliminary result)   Collection Time: 05/31/15  4:15 PM  Result Value Ref Range   Specimen Description BLOOD RIGHT ANTECUBITAL    Special Requests IN PEDIATRIC BOTTLE 3CC    Culture  Setup Time      GRAM NEGATIVE RODS AEROBIC BOTTLE  ONLY CRITICAL RESULT CALLED TO, READ BACK BY AND VERIFIED WITH: Ferrel Logan ,RN @ [phone] 06/01/15 MKELLY    Culture TOO YOUNG TO READ    Report Status PENDING   Glucose, capillary     Status: Abnormal   Collection Time: 05/31/15  8:04 PM  Result Value Ref Range   Glucose-Capillary 190 (H) 65 - 99 mg/dL   Comment 1 Notify RN   Protime-INR     Status: Abnormal   Collection Time: 05/31/15 10:51 PM  Result Value Ref Range   Prothrombin Time 58.4 (H) 11.6 - 15.2 seconds   INR 7.08 (HH) 0.00 - 1.49    Comment: CRITICAL RESULT CALLED TO, READ BACK BY AND VERIFIED WITH: CLIFTON,J RN 05/31/2015 2344 JORDANS SPECIMEN CHECKED FOR CLOTS REPEATED TO VERIFY   Basic metabolic panel     Status: Abnormal   Collection Time: 05/31/15 10:51 PM  Result Value Ref Range   Sodium 129 (L) 135 - 145 mmol/L   Potassium 4.2 3.5 - 5.1 mmol/L   Chloride 99 (L) 101 - 111 mmol/L   CO2 14 (L) 22 - 32 mmol/L   Glucose, Bld 186 (H) 65 - 99 mg/dL   BUN 117 (H) 6 - 20 mg/dL   Creatinine, Ser 9.41 (H) 0.61 - 1.24 mg/dL   Calcium 7.3 (L) 8.9 - 10.3 mg/dL   GFR calc non  Af Amer 5 (L) >60 mL/min   GFR calc Af Amer 6 (L) >60 mL/min    Comment: (NOTE) The eGFR has been calculated using the CKD EPI equation. This calculation has not been validated in all clinical situations. eGFR's persistently <60 mL/min signify possible Chronic Kidney Disease.    Anion gap 16 (H) 5 - 15  Glucose, capillary     Status: Abnormal   Collection Time: 05/31/15 11:38 PM  Result Value Ref Range   Glucose-Capillary 182 (H) 65 - 99 mg/dL   Comment 1 Notify RN   CBC     Status: Abnormal   Collection Time: 06/01/15  2:18 AM  Result Value Ref Range   WBC 9.4 4.0 - 10.5 K/uL   RBC 3.86 (L) 4.22 - 5.81 MIL/uL   Hemoglobin 10.3 (L) 13.0 - 17.0 g/dL   HCT 31.1 (L) 39.0 - 52.0 %   MCV 80.6 78.0 - 100.0 fL   MCH 26.7 26.0 - 34.0 pg   MCHC 33.1 30.0 - 36.0 g/dL   RDW 16.2 (H) 11.5 - 15.5 %   Platelets 124 (L) 150 - 400 K/uL  Basic metabolic  panel     Status: Abnormal   Collection Time: 06/01/15  2:18 AM  Result Value Ref Range   Sodium 129 (L) 135 - 145 mmol/L   Potassium 4.3 3.5 - 5.1 mmol/L   Chloride 100 (L) 101 - 111 mmol/L   CO2 13 (L) 22 - 32 mmol/L   Glucose, Bld 194 (H) 65 - 99 mg/dL   BUN 122 (H) 6 - 20 mg/dL   Creatinine, Ser 9.33 (H) 0.61 - 1.24 mg/dL   Calcium 7.2 (L) 8.9 - 10.3 mg/dL   GFR calc non Af Amer 5 (L) >60 mL/min   GFR calc Af Amer 6 (L) >60 mL/min    Comment: (NOTE) The eGFR has been calculated using the CKD EPI equation. This calculation has not been validated in all clinical situations. eGFR's persistently <60 mL/min signify possible Chronic Kidney Disease.    Anion gap 16 (H) 5 - 15  Magnesium     Status: None   Collection Time: 06/01/15  2:18 AM  Result Value Ref Range   Magnesium 2.1 1.7 - 2.4 mg/dL  Phosphorus     Status: Abnormal   Collection Time: 06/01/15  2:18 AM  Result Value Ref Range   Phosphorus 5.1 (H) 2.5 - 4.6 mg/dL  Protime-INR     Status: Abnormal   Collection Time: 06/01/15  2:18 AM  Result Value Ref Range   Prothrombin Time 60.8 (H) 11.6 - 15.2 seconds   INR 7.47 (HH) 0.00 - 1.49    Comment: CRITICAL RESULT CALLED TO, READ BACK BY AND VERIFIED WITH: CLIFTON,J RN 06/01/2015 0658 JORDANS CONSISTENT WITH PREVIOUS RESULT   Glucose, capillary     Status: Abnormal   Collection Time: 06/01/15  3:30 AM  Result Value Ref Range   Glucose-Capillary 184 (H) 65 - 99 mg/dL  Glucose, capillary     Status: Abnormal   Collection Time: 06/01/15  7:16 AM  Result Value Ref Range   Glucose-Capillary 172 (H) 65 - 99 mg/dL  Protime-INR     Status: Abnormal   Collection Time: 06/01/15 10:43 AM  Result Value Ref Range   Prothrombin Time 29.0 (H) 11.6 - 15.2 seconds   INR 2.79 (H) 0.00 - 1.49  DIC (disseminated intravasc coag) panel     Status: Abnormal   Collection Time: 06/01/15 10:43 AM  Result  Value Ref Range   Prothrombin Time 26.9 (H) 11.6 - 15.2 seconds   INR 2.53 (H) 0.00  - 1.49   aPTT 31 24 - 37 seconds   Fibrinogen >800 (H) 204 - 475 mg/dL   D-Dimer, Quant 2.76 (H) 0.00 - 0.50 ug/mL-FEU    Comment: (NOTE) At the manufacturer cut-off of 0.50 ug/mL FEU, this assay has been documented to exclude PE with a sensitivity and negative predictive value of 97 to 99%.  At this time, this assay has not been approved by the FDA to exclude DVT/VTE. Results should be correlated with clinical presentation.    Platelets 122 (L) 150 - 400 K/uL   Smear Review NO SCHISTOCYTES SEEN   Save smear     Status: None   Collection Time: 06/01/15 10:43 AM  Result Value Ref Range   Smear Review SMEAR STAINED AND AVAILABLE FOR REVIEW   Lactate dehydrogenase     Status: None   Collection Time: 06/01/15 10:43 AM  Result Value Ref Range   LDH 167 98 - 192 U/L  Bilirubin, total     Status: Abnormal   Collection Time: 06/01/15 10:43 AM  Result Value Ref Range   Total Bilirubin 1.5 (H) 0.3 - 1.2 mg/dL  Bilirubin, direct     Status: None   Collection Time: 06/01/15 10:43 AM  Result Value Ref Range   Bilirubin, Direct 0.5 0.1 - 0.5 mg/dL  Glucose, capillary     Status: Abnormal   Collection Time: 06/01/15 11:48 AM  Result Value Ref Range   Glucose-Capillary 246 (H) 65 - 99 mg/dL  Prepare fresh frozen plasma     Status: None (Preliminary result)   Collection Time: 06/01/15 12:20 PM  Result Value Ref Range   Unit Number [id number]    Blood Component Type THAWED PLASMA    Unit division 00    Status of Unit ISSUED    Transfusion Status OK TO TRANSFUSE    Unit Number [id number]    Blood Component Type THAWED PLASMA    Unit division 00    Status of Unit ISSUED    Transfusion Status OK TO TRANSFUSE   Glucose, capillary     Status: Abnormal   Collection Time: 06/01/15  3:00 PM  Result Value Ref Range   Glucose-Capillary 229 (H) 65 - 99 mg/dL     ROS:  A comprehensive review of systems was negative.  Physical Exam: Filed Vitals:   06/01/15 1530 06/01/15 1545  BP:  137/63 113/61  Pulse: 78 81  Temp:    Resp: 24 23     General -- oriented x2, pleasant and cooperative. HEENT -- Head is normocephalic. PERRLA. EOMI. Notable bleeding in gums, no specific lesions present. Integument -- some petechiae noted on dorsum on hands.  Chest -- Lungs clear to auscultation. Cardiac -- RRR. No murmurs noted.  Abdomen -- soft, nontender. No masses palpable. Bowel sounds present. Extremeties - +1 edema bilaterally to tibial tuberosity. Dorsalis pedis pulses present and symmetrical.    Assessment/Plan: 1. AKI/CKD, patient s/p left nephrectomy: patient presented with a Cr. of 9.36, BUN 122. Patient reports some issues w/ his foley when had become obstructed at some point prior to admission. This could have been the initiating event, however no hydronephrosis on [place]. PO fluids were minimal prior to admission per his wife. He is not on an ACEi and doesn't not take NSAIDs. Most likely cause is prerenal ATN w/ azotemia 2/2 hypovolemia and uroseptic hypotension (80s/50s x48hrs after admission,  was likely similar prior to treatment). Cannot rule out the possibility of developing HUS w/ positive blood cultures for G-neg rods, decreasing platelets, and renal failure.   - place 3 port dialysis cath >> will initiate HD today due to BUN  - Suggest consideration to D/C Vanc (keep fortaz)  - Bicarb for metabolic acidosis  - Peripheral smear, haptoglobin, DIC panel pending.  - consider DC protonix due to interstitial renal risk 2. Metabolic acidosis, anion gap: gap 16, likely 2/2 uremia. Bicarb as above.  3. Hematologic: concern for DIC vs HUS w/ platelet dropping and gum bleeding.   - PT/INR 29/2.79 >>improving since receiving Vit K.  - labs pending  - transfuse as needed per primary.  4. Urosepsis, w/ shock: Fortaz and vanc now. Can probably DC Vanc w/ new culure results. No pressors at this time. Management per CCM.   Georges Lynch 06/01/2015, 4:06 PM   I have seen and examined  this patient and agree with plan as outlined above with some additions in red.  Mr. Tiemann presents with AKI/CKD in setting of urosepsis, bladder outlet obstruction, relative hypotension with concomitant ACE-inhibition.  DDx includes ischemic ATN (worsened by presence of ACE-inhibition), bladder outlet obstruction in a solitary functioning kidney, and also possibility of HUS due to gram negative rod septicemia.  Will plan for HD today due to marked azotemia and volume overload.  If studies support the diagnosis of HUS would then also set up for plasmapheresis.  Will send off complement levels and peripheral smear.  Continue with current treatment for now and appreciate ID input as well as PCCM's assistance with HD cath placement. Governor Rooks Olga Seyler,MD 06/01/2015 4:36 PM

## 2015-06-02 ENCOUNTER — Inpatient Hospital Stay (HOSPITAL_COMMUNITY): Payer: BLUE CROSS/BLUE SHIELD

## 2015-06-02 DIAGNOSIS — N179 Acute kidney failure, unspecified: Secondary | ICD-10-CM | POA: Insufficient documentation

## 2015-06-02 LAB — ANTI-DNA ANTIBODY, DOUBLE-STRANDED

## 2015-06-02 LAB — CBC WITH DIFFERENTIAL/PLATELET
BASOS ABS: 0 10*3/uL (ref 0.0–0.1)
BASOS PCT: 0 %
EOS ABS: 0 10*3/uL (ref 0.0–0.7)
Eosinophils Relative: 0 %
HEMATOCRIT: 30.2 % — AB (ref 39.0–52.0)
Hemoglobin: 10.3 g/dL — ABNORMAL LOW (ref 13.0–17.0)
LYMPHS ABS: 0.8 10*3/uL (ref 0.7–4.0)
Lymphocytes Relative: 7 %
MCH: 27.4 pg (ref 26.0–34.0)
MCHC: 34.1 g/dL (ref 30.0–36.0)
MCV: 80.3 fL (ref 78.0–100.0)
Monocytes Absolute: 1 10*3/uL (ref 0.1–1.0)
Monocytes Relative: 8 %
NEUTROS ABS: 10.2 10*3/uL — AB (ref 1.7–7.7)
Neutrophils Relative %: 85 %
Platelets: 132 10*3/uL — ABNORMAL LOW (ref 150–400)
RBC: 3.76 MIL/uL — ABNORMAL LOW (ref 4.22–5.81)
RDW: 16.5 % — AB (ref 11.5–15.5)
WBC: 12 10*3/uL — ABNORMAL HIGH (ref 4.0–10.5)

## 2015-06-02 LAB — PREPARE FRESH FROZEN PLASMA
UNIT DIVISION: 0
Unit division: 0

## 2015-06-02 LAB — BASIC METABOLIC PANEL
Anion gap: 20 — ABNORMAL HIGH (ref 5–15)
BUN: 111 mg/dL — ABNORMAL HIGH (ref 6–20)
CALCIUM: 7.2 mg/dL — AB (ref 8.9–10.3)
CO2: 14 mmol/L — AB (ref 22–32)
CREATININE: 9.67 mg/dL — AB (ref 0.61–1.24)
Chloride: 97 mmol/L — ABNORMAL LOW (ref 101–111)
GFR calc non Af Amer: 5 mL/min — ABNORMAL LOW (ref >60)
GFR, EST AFRICAN AMERICAN: 5 mL/min — AB (ref >60)
Glucose, Bld: 264 mg/dL — ABNORMAL HIGH (ref 65–99)
Potassium: 3.6 mmol/L (ref 3.5–5.1)
SODIUM: 131 mmol/L — AB (ref 135–145)

## 2015-06-02 LAB — PROTIME-INR
INR: 2.12 — AB (ref 0.00–1.49)
Prothrombin Time: 23.6 seconds — ABNORMAL HIGH (ref 11.6–15.2)

## 2015-06-02 LAB — GLUCOSE, CAPILLARY
GLUCOSE-CAPILLARY: 230 mg/dL — AB (ref 65–99)
GLUCOSE-CAPILLARY: 220 mg/dL — AB (ref 65–99)
Glucose-Capillary: 255 mg/dL — ABNORMAL HIGH (ref 65–99)
Glucose-Capillary: 257 mg/dL — ABNORMAL HIGH (ref 65–99)
Glucose-Capillary: 346 mg/dL — ABNORMAL HIGH (ref 65–99)
Glucose-Capillary: 262 mg/dL — ABNORMAL HIGH (ref 65–99)

## 2015-06-02 LAB — COMPLEMENT, TOTAL: COMPL TOTAL (CH50): 50 U/mL (ref 42–60)

## 2015-06-02 LAB — KAPPA/LAMBDA LIGHT CHAINS
KAPPA FREE LGHT CHN: 60.05 mg/L — AB (ref 3.30–19.40)
Kappa, lambda light chain ratio: 1.65 (ref 0.26–1.65)
LAMDA FREE LIGHT CHAINS: 36.34 mg/L — AB (ref 5.71–26.30)

## 2015-06-02 LAB — ANTINUCLEAR ANTIBODIES, IFA: ANA Ab, IFA: NEGATIVE

## 2015-06-02 LAB — ANTISTREPTOLYSIN O TITER: ASO: 72 [IU]/mL (ref 0.0–200.0)

## 2015-06-02 LAB — HAPTOGLOBIN: Haptoglobin: 335 mg/dL — ABNORMAL HIGH (ref 34–200)

## 2015-06-02 LAB — MPO/PR-3 (ANCA) ANTIBODIES
ANCA Proteinase 3: <3.5 U/mL (ref 0.0–3.5)
Myeloperoxidase Abs: <9 U/mL (ref 0.0–9.0)

## 2015-06-02 LAB — C3 COMPLEMENT: C3 Complement: 91 mg/dL (ref 82–167)

## 2015-06-02 LAB — C4 COMPLEMENT: Complement C4, Body Fluid: 17 mg/dL (ref 14–44)

## 2015-06-02 MED ORDER — DIATRIZOATE MEGLUMINE & SODIUM 66-10 % PO SOLN
ORAL | Status: AC
  Filled 2015-06-02: qty 30

## 2015-06-02 MED ORDER — INSULIN ASPART 100 UNIT/ML ~~LOC~~ SOLN
0.0000 [IU] | Freq: Three times a day (TID) | SUBCUTANEOUS | Status: DC
  Administered 2015-06-02: 15 [IU] via SUBCUTANEOUS
  Administered 2015-06-02: 11 [IU] via SUBCUTANEOUS

## 2015-06-02 MED ORDER — ACETAMINOPHEN 325 MG PO TABS
325.0000 mg | ORAL_TABLET | Freq: Four times a day (QID) | ORAL | Status: DC | PRN
  Administered 2015-06-02 – 2015-06-04 (×3): 325 mg via ORAL
  Filled 2015-06-02 (×3): qty 1

## 2015-06-02 MED ORDER — SODIUM CHLORIDE 0.9 % IV SOLN
INTRAVENOUS | Status: DC
  Filled 2015-06-02: qty 2.5

## 2015-06-02 MED ORDER — PANTOPRAZOLE SODIUM 40 MG PO TBEC
40.0000 mg | DELAYED_RELEASE_TABLET | Freq: Every day | ORAL | Status: DC
Start: 2015-06-02 — End: 2015-06-09
  Administered 2015-06-02 – 2015-06-07 (×6): 40 mg via ORAL
  Filled 2015-06-02 (×6): qty 1

## 2015-06-02 MED ORDER — DIATRIZOATE MEGLUMINE & SODIUM 66-10 % PO SOLN
15.0000 mL | ORAL | Status: AC
  Administered 2015-06-02 (×2): 15 mL via ORAL
  Filled 2015-06-02: qty 30

## 2015-06-02 MED ORDER — INSULIN ASPART 100 UNIT/ML ~~LOC~~ SOLN
0.0000 [IU] | SUBCUTANEOUS | Status: DC
  Administered 2015-06-02 – 2015-06-03 (×4): 7 [IU] via SUBCUTANEOUS
  Administered 2015-06-03: 11 [IU] via SUBCUTANEOUS

## 2015-06-02 MED ORDER — DEXTROSE 5 % IV SOLN
2.0000 g | Freq: Two times a day (BID) | INTRAVENOUS | Status: DC
  Administered 2015-06-02: 2 g via INTRAVENOUS
  Filled 2015-06-02 (×2): qty 2

## 2015-06-02 NOTE — Progress Notes (Signed)
Discussed patient low BP with dialysis RN. He advised me that he has called the renal MD. Patient asymptomatic at this time.

## 2015-06-02 NOTE — Progress Notes (Signed)
/  Dr. Redmond Pulling advised of patient bp 72/41. Will continue to monitor very closely. Patient requesting water at this time.

## 2015-06-02 NOTE — Procedures (Signed)
Patient seen on Hemodialysis. QB 200, UF goal 1.5L Treatment adjusted to 3K bath for potassium of 3.6.  Elmarie Shiley MD [facility]. Office # [phone] Pager # [phone] 3:24 PM   Update: I was called earlier by CCM resident to inform me of the findings from [name]'s CT abdomen that showed  IMPRESSION: Large right perinephric hematoma displacing the right kidney superiorly and medially. There is no right hydronephrosis. Prior left nephrectomy. Small amount of ascites in the abdomen and pelvis. Bilateral pleural effusions.  CBC Latest Ref Rng 06/02/2015 06/01/2015 06/01/2015  WBC 4.0 - 10.5 K/uL 12.0(H) - 9.4  Hemoglobin 13.0 - 17.0 g/dL 10.3(L) - 10.3(L)  Hematocrit 39.0 - 52.0 % 30.2(L) - 31.1(L)  Platelets 150 - 400 K/uL 132(L) 122(L) 124(L)    A/P: With stable H/H and no apparent hydronephrosis or clinical evidence of IAH-- no intervention required at this time-- may need to repeat CT abdomen/pelvis if declines Hgb further or hemodynamically unstable. This may have been in part culpable for events last PM.  Elmarie Shiley MD [facility]. Office # [phone] Pager # [phone] 3:29 PM

## 2015-06-02 NOTE — Clinical Documentation Improvement (Signed)
Nephrology/Renal Critical Care  A cause and effect relationship may not be assumed and must be documented by a provider.    Please clarify the relationship, if any, between Chronic indwelling foley and Severe Sepsis with septic shock .  Are the conditions:   Severe Sepsis Due to or associated with Chronic indwelling foley  Severe Sepsis Unrelated to Chronic indwelling Foley  Other  Clinically Undetermined   Supporting Information: Septicemia, Severe Sepsis, Septic Shock,     Please exercise your independent, professional judgment when responding. A specific answer is not anticipated or expected.   Thank You,  Douglas [phone]

## 2015-06-02 NOTE — Progress Notes (Signed)
Diet changed back to clear liquid per patient request. MD advised earlier to advance diet as tolerated. Patient not tolerating renal diet at this time.

## 2015-06-02 NOTE — Clinical Documentation Improvement (Addendum)
Nephrology/Renal Critical Care  In ICD 10 Urosepsis  is not codeable,  further specificity is required to capture the correct diagnosis.  Thank you    Sepsis secondary to UTI   UTI without sepsis  Other  Clinically Undetermined  .   Supporting Information:  Septic Shock , Severe Sepsis   Please exercise your independent, professional judgment when responding. A specific answer is not anticipated or expected.   Thank You,  Harrison [phone]

## 2015-06-02 NOTE — Progress Notes (Addendum)
Inpatient Diabetes Program Recommendations  AACE/ADA: New Consensus Statement on Inpatient Glycemic Control (2015)  Target Ranges:  Prepandial:   less than 140 mg/dL      Peak postprandial:   less than 180 mg/dL (1-2 hours)      Critically ill patients:  140 - 180 mg/dL  Results for TILDON, BAUER (MRN [id number]) as of 06/02/2015 10:07  Ref. Range 06/01/2015 07:16 06/01/2015 11:48 06/01/2015 15:00 06/01/2015 19:23 06/01/2015 23:20 06/02/2015 03:36 06/02/2015 07:29  Glucose-Capillary Latest Ref Range: 65-99 mg/dL 172 (H) 246 (H) 229 (H) 291 (H) 230 (H) 255 (H) 257 (H)   Results for RODERICH, COCKCROFT (MRN [id number]) as of 06/02/2015 10:07  Ref. Range 04/29/2015 15:45  Hemoglobin A1C Latest Ref Range: 4.8-5.6 % 8.6 (H)   Review of Glycemic Control  Diabetes history: DM2 Outpatient Diabetes medications: None (diet controlled according to H&P) Current orders for Inpatient glycemic control: Novolog 0-15 units Q4H  Inpatient Diabetes Program Recommendations: Insulin - Basal: Over the past 24 hours, patient has received a total of Novolog 39 units for correction. Please consider ordering low dose basal insulin. Recommend starting with Levemir 13 units daily (based on 129 kg x 0.1 units). HgbA1C: A1C 8.6% on 04/29/15 indicating and average glucose of 200 mg/dl over the past 2-3 months. According to the H&P, patient uses diet only to control diabetes. Patient may need to be prescribed medication for outpatient DM control and have patient follow up with PCP regarding diabetes control.  NOTE: Spoke with patient about diabetes and home regimen for diabetes control. Patient reports that he is followed by PCP for diabetes management and currently he does not take any medication for diabetes. Patient controls diabetes with diet and exercise and patient reports that he use to be on insulin in the past for diabetes control. Patient states that he has not taken any insulin in over 1 1/2 years and he lost a lot of weight  so he was able to keep his diabetes controlled with diet and exercise. Patient still checks glucose at least daily.  Discussed A1C results (8.6% on 04/29/15) and explained that his current A1C indicates an average glucose of 200 mg/dl over the past 2-3 months. Discussed glucose and A1C goals. Discussed importance of maintaining good CBG control to prevent long-term and short-term complications. Encouraged patient to check his glucose at least 1-2  times per day and to follow up with PCP regarding glycemic control. Patient verbalized understanding of information discussed and he states that he has no further questions at this time related to diabetes.  Thanks, Barnie Alderman, RN, MSN, CDE Diabetes Coordinator Inpatient Diabetes Program [phone] (Team Pager from [place] to [place]) [phone] ([facility] office) [phone] [facility] office) [phone] [facility] office)

## 2015-06-02 NOTE — Consult Note (Signed)
Subjective:  Feeling better per patient. Did not tolerate HD yesterday. Will try CVVHD today. Objective Vital signs in last 24 hours: Filed Vitals:   06/02/15 0900 06/02/15 1000 06/02/15 1100 06/02/15 1119  BP: 137/71 113/58 122/68   Pulse: 80 83 72   Temp:    97.6 F (36.4 C)  TempSrc:    Axillary  Resp: 20 19 31    Height:      Weight:      SpO2: 100% 99% 98%    Weight change: 18 lb 6.3 oz (8.343 kg)  Intake/Output Summary (Last 24 hours) at 06/02/15 1253 Last data filed at 06/02/15 0600  Gross per 24 hour  Intake   1946 ml  Output    210 ml  Net   1736 ml    Assessment/ Plan: Pt is a 75 y.o. 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 Patient was admitted to the ICU for septic shock 2/2 suspected UTI. Patient's Cr was notably elevated at 9.33, when his baseline was thought to be around 1.7-1.9. PT/INR was elevated. Blood cultures were positive for Gram Neg Rods.  Assessment/Plan: 1. AKI/CKD, patient s/p left nephrectomy: patient presented with a Cr. of 9.36, BUN 122. Unknown etiology at this time. DDx includes ischemic ATN vs obstructive vs HUS. Not on ACEi or NSAIDs. Positive blood cultures for G-neg rods -- no speciation at this time.  - Initiating CVVHD today due to intolerance to HD yesterday. - Peripheral smear w/o schistocytes  - Haptoglobin still pending  - DIC panel: not conclusive; can r/o DIC, but early HUS still possible.  - Fibrinogen >800 (consider metastatic dz?) 2. Metabolic acidosis, anion gap: gap 20, likely 2/2 uremia. CVVHD should help 3. Hematologic: concern for HUS - see above - transfuse as needed per primary.  4. Urosepsis, w/ shock: Fort[place]. Management and pressors per CCM.   Labs: Basic Metabolic Panel:  Recent Labs Lab 05/31/15 2251  06/01/15 0218 06/02/15 0217  NA 129* 129* 131*  K 4.2 4.3 3.6  CL 99* 100* 97*  CO2 14* 13* 14*  GLUCOSE 186* 194* 264*  BUN 117* 122* 111*  CREATININE 9.41* 9.33* 9.67*  CALCIUM 7.3* 7.2* 7.2*  PHOS  --  5.1*  --    Liver Function Tests:  Recent Labs Lab 06/01/15 1043  BILITOT 1.5*   No results for input(s): LIPASE, AMYLASE in the last 168 hours. No results for input(s): AMMONIA in the last 168 hours. CBC:  Recent Labs Lab 05/31/15 1055 06/01/15 0218 06/01/15 1043 06/02/15 0217  WBC 10.3 9.4  --  12.0*  NEUTROABS 9.1*  --   --  10.2*  HGB 12.3* 10.3*  --  10.3*  HCT 37.0* 31.1*  --  30.2*  MCV 81.3 80.6  --  80.3  PLT 131* 124* 122* 132*   Cardiac Enzymes: No results for input(s): CKTOTAL, CKMB, CKMBINDEX, TROPONINI in the last 168 hours. CBG:  Recent Labs Lab 06/01/15 1923 06/01/15 2320 06/02/15 0336 06/02/15 0729 06/02/15 1114  GLUCAP 291* 230* 255* 257* 346*    Iron Studies: No results for input(s): IRON, TIBC, TRANSFERRIN, FERRITIN in the last 72 hours. Studies/Results: Dg Chest 2 View  05/31/2015  CLINICAL DATA:  75 year old male with hypotension concerning for sepsis EXAM: CHEST  2 VIEW COMPARISON:  Prior chest x-ray 03/18/2013 FINDINGS: Low inspiratory volumes with bibasilar atelectasis. There is  a left lower lobe retrocardiac opacity resolving in a positive spine sign on the lateral view. Cardiac and mediastinal contours are enlarged but otherwise unchanged. Moderate hiatal hernia again noted. No pulmonary edema, pleural effusion or pneumothorax. No acute osseous abnormality. IMPRESSION: 1. Left lower lobe opacity may reflect atelectasis or infiltrate. Given the clinical history of sepsis, pneumonia is not excluded. 2. Low inspiratory volumes. 3. Moderate hiatal hernia. Electronically Signed   By: Jacqulynn Cadet M.D.   On: 05/31/2015 13:25   US Renal Port  06/01/2015  CLINICAL DATA:  Acute onset of septic shock.  Initial encounter. EXAM: RENAL /  URINARY TRACT ULTRASOUND COMPLETE COMPARISON:  CT of the abdomen and pelvis performed 03/15/2014, and renal ultrasound performed 03/06/2013 FINDINGS: Right Kidney: Length: 17.6 cm. Echogenicity within normal limits. Compensatory nephromegaly is noted. A small 1.9 cm cyst is noted at the upper pole of the right kidney. No hydronephrosis visualized. Left Kidney: Status post left-sided nephrectomy. Bladder: Decompressed and not well assessed. IMPRESSION: 1. No evidence of hydronephrosis. 2. Small right renal cyst noted. Electronically Signed   By: Garald Balding M.D.   On: 06/01/2015 01:42   Dg Chest Port 1 View  06/02/2015  CLINICAL DATA:  Dyspnea EXAM: PORTABLE CHEST 1 VIEW COMPARISON:  Yesterday FINDINGS: Borderline cardiomegaly. Stable right jugular central venous catheter with its tip in the mid SVC. Bibasilar hazy opacity is unchanged. Upper lungs clear. No pneumothorax. IMPRESSION: Stable bibasilar atelectasis versus airspace disease. Electronically Signed   By: Marybelle Killings M.D.   On: 06/02/2015 08:01   Dg Chest Port 1 View  06/01/2015  CLINICAL DATA:  Central line placement. EXAM: PORTABLE CHEST 1 VIEW COMPARISON:  05/31/2015. FINDINGS: 1430 hours. New right IJ central venous catheter extends to the mid SVC level. There are persistent low lung volumes with mildly increased bibasilar atelectasis. No definite edema, pleural effusion or pneumothorax. The heart size and mediastinal contours are stable. IMPRESSION: Interval central line placement as described without complicating pneumothorax. Mildly increased bibasilar atelectasis. Electronically Signed   By: Richardean Sale M.D.   On: 06/01/2015 14:42   Medications: Infusions:    Scheduled Medications: . cefTAZidime (FORTAZ)  IV  2 g Intravenous Q48H  . diatrizoate meglumine-sodium      . insulin aspart  0-20 Units Subcutaneous TID WC  . pantoprazole (PROTONIX) IV  40 mg Intravenous Q24H    have reviewed scheduled and prn  medications.  Physical Exam: General -- oriented x3, pleasant and cooperative. Integument -- some petechiae noted on dorsum on hands.  Chest -- Lungs clear to auscultation. Cardiac -- RRR. No murmurs noted.  Abdomen -- soft, nontender. No masses palpable. Bowel sounds present. Extremeties - +2 edema bilaterally to tibial tuberosity. Dorsalis pedis pulses present and symmetrical.    Elberta Leatherwood, MD,MS,  PGY2 06/02/2015 12:53 PM   LOS: 2 days      I have seen and examined this patient and agree with plan as outlined by Dr. Alease Frame.  Will attempt HD again today during the daytime and hopefully he will be able to tolerate it today, otherwise will need to initiate CVVHD. Broadus John A Khang Hannum,MD 06/02/2015 1:30 PM

## 2015-06-02 NOTE — Progress Notes (Signed)
Pharmacy Antibiotic Note  [name] is a 75 y.o. male admitted on 05/31/2015 with sepsis/UTI. Recently seen Friday in ED for catheter blockage/bleeding. Pharmacy has been consulted for ceftaz dosing. Given 1x dose of vanc and Zosyn 30 min infusion also ordered x 1. AF, SCr 9.36 on admit (recent baseline ~1.4-1.5), CrCl~7. Patient starting CVVHD today requiring dose adjustment of ceftaz.  Plan: Ceftaz 2g IV q12h Monitor clinical progress, c/s, renal function, abx plan/LOT  Height: 5\' 10"  (177.8 cm) Weight: 284 lb 6.3 oz (129 kg) IBW/kg (Calculated) : 73  Temp (24hrs), Avg:98.4 F (36.9 C), Min:97.5 F (36.4 C), Max:101.4 F (38.6 C)   Recent Labs Lab 05/31/15 1055 05/31/15 1145 05/31/15 1352 05/31/15 1444 05/31/15 1553 05/31/15 2251 06/01/15 0218 06/02/15 0217  WBC 10.3  --   --   --   --   --  9.4 12.0*  CREATININE 9.36*  --   --   --  9.07* 9.41* 9.33* 9.67*  LATICACIDVEN  --  3.19* 2.6* 1.68  --   --   --   --     Estimated Creatinine Clearance: 9.2 mL/min (by C-G formula based on Cr of 9.67).    No Known Allergies  Antimicrobials this admission: 4/25 vanc x1 4/25 zosyn x1 4/25 ceftaz >>  Dose adjustments this admission: ceftaz 2 g q48h > ceftaz 2 g q12h  Microbiology results: 4/25 BCx: gram neg rods 4/25 MRSA PCR: negative   Nilsa Nutting, PharmD Candidate 06/02/2015 1:47 PM

## 2015-06-02 NOTE — Progress Notes (Signed)
PULMONARY / CRITICAL CARE MEDICINE   Name: [name] MRN: [id number] DOB: 1940/11/13    ADMISSION DATE:  05/31/2015 CONSULTATION DATE:  05/31/15  REFERRING MD:  EDP  CHIEF COMPLAINT:  Hypotension  HISTORY OF PRESENT ILLNESS:  [name] is a 74 y.o. male with PMH as outlined below including prostate CA s/p radiation 2011 and RCC s/p left nephrectomy.  He was seen in ED 4/22 for clogged catheter which was irrigated and replaced.  He apparently has had hematuria for quite some time which has been attributed to scarring from prior radiation.  He had been in his USOH until roughly 4/20 when he had decreased appetite.  He had not been eating or drinking for the past 5 days. On 04/25 he went to have his INR checked (on coumadin for A.fib and hx DVT / PE) and was found to have INR of 8 and was hypotensive with SBP 70/44.  He was subsequently sent to ED for further evaluation.  In ED, he was found to have UTI and despite 4.5L IVF, he remained hypotensive.  He also had multiple metabolic derangements including Na 124, K 5.0, AGMA, lactate 3, SCr 9.36. PCCM was therefore consulted for admission of septic shock due to urosepsis as well as AKI.    No current facility-administered medications on file prior to encounter.   Current Outpatient Prescriptions on File Prior to Encounter  Medication Sig  . carvedilol (COREG) 3.125 MG tablet take 1 tablet by mouth twice a day with meals  . fenofibrate (TRICOR) 145 MG tablet Take 145 mg by mouth daily.   . ferrous sulfate 325 (65 FE) MG EC tablet take 1 tablet by mouth once daily  . furosemide (LASIX) 40 MG tablet take 1 tablet by mouth once daily  . HYDROcodone-acetaminophen (NORCO/VICODIN) 5-325 MG tablet Take 1-2 tablets by mouth every 4 (four) hours as needed (breakthrough pain).  . Niacin CR 1000 MG TBCR Take 1,000 mg by mouth at bedtime.   . polyvinyl alcohol (LIQUIFILM TEARS) 1.4 % ophthalmic solution Place 1 drop into both eyes 2 (two) times  daily as needed for dry eyes.  . potassium chloride (K-DUR,KLOR-CON) 10 MEQ tablet take 1 tablet by mouth once daily  . quinapril (ACCUPRIL) 20 MG tablet Take 20 mg by mouth daily.   Marland Kitchen warfarin (COUMADIN) 1 MG tablet Take 1 mg by mouth every Monday, Wednesday, and Friday.  . warfarin (COUMADIN) 5 MG tablet Take 5-6 mg by mouth daily. 6 mg on Monday, Wednesday, and Friday.  Take 5 mg on Tuesday, Thursday, Saturday, and Sunday.    SUBJECTIVE:  Pt hypertensive, tachycardic, febrile, hypoxic immediately after starting HD last night.  HD was stopped.  Symptoms resolved spontaneously and pt was at baseline when night team went to evaluate.  This AM patient says he felt cold on HD but otherwise asymptomatic.  He feels better this AM.  VITAL SIGNS: BP 105/57 mmHg  Pulse 76  Temp(Src) 98.2 F (36.8 C) (Oral)  Resp 16  Ht 5\' 10"  (1.778 m)  Wt 284 lb 6.3 oz (129 kg)  BMI 40.81 kg/m2  SpO2 98%  HEMODYNAMICS:    VENTILATOR SETTINGS:  N/A  INTAKE / OUTPUT: I/O last 3 completed shifts: In: 2276 [P.O.:490; I.V.:1240; Blood:496; IV Piggyback:50] Out: 360 [Urine:360]   PHYSICAL EXAMINATION: General: Adult man in bed in NAD  Neuro: A&O x 3, responding appropriately HEENT: [place]/AT Cardiovascular: RRR, no M/R/G.  Lungs: CTA B/L Abdomen: obese, BS, soft, ND, mild TTP  Musculoskeletal: trace B/L LE edema  Skin: Intact, warm, no rashes.  LABS:  BMET  Recent Labs Lab 05/31/15 2251 06/01/15 0218 06/02/15 0217  NA 129* 129* 131*  K 4.2 4.3 3.6  CL 99* 100* 97*  CO2 14* 13* 14*  BUN 117* 122* 111*  CREATININE 9.41* 9.33* 9.67*  GLUCOSE 186* 194* 264*    Electrolytes  Recent Labs Lab 05/31/15 2251 06/01/15 0218 06/02/15 0217  CALCIUM 7.3* 7.2* 7.2*  MG  --  2.1  --   PHOS  --  5.1*  --     CBC  Recent Labs Lab 05/31/15 1055 06/01/15 0218 06/01/15 1043 06/02/15 0217  WBC 10.3 9.4  --  12.0*  HGB 12.3* 10.3*  --  10.3*  HCT 37.0* 31.1*  --  30.2*  PLT 131* 124*  122* 132*    Coag's  Recent Labs Lab 06/01/15 0218 06/01/15 1043 06/02/15 0217  APTT  --  31  --   INR 7.47* 2.53*  2.79* 2.12*    Sepsis Markers  Recent Labs Lab 05/31/15 1145 05/31/15 1352 05/31/15 1444  LATICACIDVEN 3.19* 2.6* 1.68    ABG No results for input(s): PHART, PCO2ART, PO2ART in the last 168 hours.  Liver Enzymes  Recent Labs Lab 06/01/15 1043  BILITOT 1.5*    Cardiac Enzymes No results for input(s): TROPONINI, PROBNP in the last 168 hours.  Glucose  Recent Labs Lab 06/01/15 0716 06/01/15 1148 06/01/15 1500 06/01/15 1923 06/01/15 2320 06/02/15 0336  GLUCAP 172* 246* 229* 291* 230* 255*    Imaging Dg Chest Port 1 View  06/01/2015  CLINICAL DATA:  Central line placement. EXAM: PORTABLE CHEST 1 VIEW COMPARISON:  05/31/2015. FINDINGS: 1430 hours. New right IJ central venous catheter extends to the mid SVC level. There are persistent low lung volumes with mildly increased bibasilar atelectasis. No definite edema, pleural effusion or pneumothorax. The heart size and mediastinal contours are stable. IMPRESSION: Interval central line placement as described without complicating pneumothorax. Mildly increased bibasilar atelectasis. Electronically Signed   By: Richardean Sale M.D.   On: 06/01/2015 14:42    STUDIES:  CXR 04/25 > Low volumes. LLL opacity may reflect atelectasis or infiltrate. Renal US 04/25 > surgically absent left kidney, no hydronephrosis, small right renal cyst.  CULTURES: Blood 04/25 > gram neg rods, cx pending Urine 04/25 > pending MRSA nares > negative Blood 04/26 >  ANTIBIOTICS: Ceftaz 04/25 > Vanc 04/25 > 04/25  SIGNIFICANT EVENTS: 04/25 > admitted with septic shock due to UTI.  Significant AKI.  LINES/TUBES: PIV  DISCUSSION: 75 y.o. M with hx RCC and prostate CA s/p radiation 2011  ASSESSMENT / PLAN:  CARDIOVASCULAR A:  Shock - primarily 2/2 UTI.  Better with fluids.  Not on pressors. Hx a.fib (on  coumadin), HLD HTN - significant hypertension immediately after starting HD; pressures now hypo-normotensive. P:  Continue to hold outpatient carvedilol, fenofibrate, furosemide, niacin, quinapril, coumadin.  INFECTIOUS A:   Septic shock - likely urine source.  Has hx of E.coli and pseudomonas UTI in the past.  Gram neg rods growing in two sets of blood cx.  WBC up from 9.4 to 12 today. P:   Continue ceftazidime, renally dose Follow-up cx.  PULMONARY A: Acute hypoxic respiratory failure. Atelectasis. 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 Incentive spirometry. Hold coumadin given elevated INR Heparin gtt once INR < 2 and if no evidence of  bleeding  RENAL A:   AKI - pre-renal vs ATN. Labwork does not support HUS.  No obstruction on renal US. Pt reported right low back pain this AM. AGMA - due to lactate, uremia. Hyponatremia - slowly improving Mild hyperkalemia - resolved Chronic foley w/hematuria at admission - improving, likely 2/2 infx; involve Urology if hematuria returns/persists despite trx of UTI P:   Continue fluids Daily RFP Replace electrolytes prn Nephrology consulted given anuria, appreciate recs. - will retry HD today. Since renal US neg, will get CT abd/pelvis wo contrast (oral contrast okay) to evaluate kidneys/urinary tract given flank pain, renal failure  GASTROINTESTINAL A:   Obesity. Nutrition. P:   Clear liquid, ADAT PPI for SUP  HEMATOLOGIC / ONCOLOGIC A:   Supratherapeutic INR - reportedly 8 at outpatient check 04/25; improving after FFP, vit K. Hx prostate CA s/p radiation 2011, RCC s/p left nephrectomy, DVT / PE (on coumadin). VTE Prophylaxis. No evidence of hemolysis or DIC; abnormalities likely due to severe infx Hgb/plt stable P:  Monitor INR SCD's / heparin gtt once INR < 2 if no evidence of bleeding Daily CBC  ENDOCRINE A:   DM - diet controlled.  CBGs  172 - 291 in past 24 hours. P:   May need to increase to SSI-R, careful given renal failure CBGs ac/hs  NEUROLOGIC A:   No acute issues. P:   No interventions required.  Family updated: wife updated at bedside  Interdisciplinary Family Meeting v Palliative Care Meeting:  Due by: 06/05/15.  Raymond Maxin, DO IMTS PGY3 on PCCM Service [phone] 06/02/2015, 7:09 AM

## 2015-06-02 NOTE — Progress Notes (Signed)
Pt started tx at 2225 , pt co being cold increasing as tx continued, hr rate and bp continued to rise . Temperature taken orally varied from 98.0 to 99.5. Pt continued to complain on increased discomfort as well increased difficulty breathing. Dr j. deterding notified at 2325 , tx terminated at 2230 per dr deterding , axilary temp taken was 101.4. At 0000 pt states he feels better bp lower, pt having less difficulty breathing.

## 2015-06-03 ENCOUNTER — Telehealth: Payer: Self-pay | Admitting: *Deleted

## 2015-06-03 DIAGNOSIS — R0600 Dyspnea, unspecified: Secondary | ICD-10-CM | POA: Insufficient documentation

## 2015-06-03 LAB — CBC
HCT: 23.5 % — ABNORMAL LOW (ref 39.0–52.0)
HEMOGLOBIN: 7.8 g/dL — AB (ref 13.0–17.0)
MCH: 26.7 pg (ref 26.0–34.0)
MCHC: 33.2 g/dL (ref 30.0–36.0)
MCV: 80.5 fL (ref 78.0–100.0)
Platelets: 162 10*3/uL (ref 150–400)
RBC: 2.92 MIL/uL — AB (ref 4.22–5.81)
RDW: 16.4 % — AB (ref 11.5–15.5)
WBC: 13.3 10*3/uL — AB (ref 4.0–10.5)

## 2015-06-03 LAB — GLUCOSE, CAPILLARY
GLUCOSE-CAPILLARY: 207 mg/dL — AB (ref 65–99)
GLUCOSE-CAPILLARY: 204 mg/dL — AB (ref 65–99)
GLUCOSE-CAPILLARY: 279 mg/dL — AB (ref 65–99)
GLUCOSE-CAPILLARY: 270 mg/dL — AB (ref 65–99)
GLUCOSE-CAPILLARY: 269 mg/dL — AB (ref 65–99)
GLUCOSE-CAPILLARY: 211 mg/dL — AB (ref 65–99)
GLUCOSE-CAPILLARY: 153 mg/dL — AB (ref 65–99)
Glucose-Capillary: 218 mg/dL — ABNORMAL HIGH (ref 65–99)
Glucose-Capillary: 263 mg/dL — ABNORMAL HIGH (ref 65–99)
Glucose-Capillary: 252 mg/dL — ABNORMAL HIGH (ref 65–99)
Glucose-Capillary: 245 mg/dL — ABNORMAL HIGH (ref 65–99)
Glucose-Capillary: 262 mg/dL — ABNORMAL HIGH (ref 65–99)

## 2015-06-03 LAB — PROTIME-INR
INR: 3.13 — AB (ref 0.00–1.49)
Prothrombin Time: 31.6 seconds — ABNORMAL HIGH (ref 11.6–15.2)

## 2015-06-03 LAB — RENAL FUNCTION PANEL
ALBUMIN: 1.7 g/dL — AB (ref 3.5–5.0)
ANION GAP: 15 (ref 5–15)
BUN: 93 mg/dL — AB (ref 6–20)
CO2: 21 mmol/L — ABNORMAL LOW (ref 22–32)
Calcium: 7.3 mg/dL — ABNORMAL LOW (ref 8.9–10.3)
Chloride: 97 mmol/L — ABNORMAL LOW (ref 101–111)
Creatinine, Ser: 8.96 mg/dL — ABNORMAL HIGH (ref 0.61–1.24)
GFR, EST AFRICAN AMERICAN: 6 mL/min — AB (ref >60)
GFR, EST NON AFRICAN AMERICAN: 5 mL/min — AB (ref >60)
Glucose, Bld: 210 mg/dL — ABNORMAL HIGH (ref 65–99)
PHOSPHORUS: 4.2 mg/dL (ref 2.5–4.6)
POTASSIUM: 3.7 mmol/L (ref 3.5–5.1)
Sodium: 133 mmol/L — ABNORMAL LOW (ref 135–145)

## 2015-06-03 LAB — PROTEIN ELECTROPHORESIS, SERUM
A/G RATIO SPE: 0.8 (ref 0.7–1.7)
ALPHA-1-GLOBULIN: 0.4 g/dL (ref 0.0–0.4)
ALPHA-2-GLOBULIN: 0.9 g/dL (ref 0.4–1.0)
Albumin ELP: 2.1 g/dL — ABNORMAL LOW (ref 2.9–4.4)
Beta Globulin: 0.7 g/dL (ref 0.7–1.3)
GLOBULIN, TOTAL: 2.5 g/dL (ref 2.2–3.9)
Gamma Globulin: 0.4 g/dL (ref 0.4–1.8)
TOTAL PROTEIN ELP: 4.6 g/dL — AB (ref 6.0–8.5)

## 2015-06-03 LAB — HEPATITIS B SURFACE ANTIBODY,QUALITATIVE: Hep B S Ab: NONREACTIVE

## 2015-06-03 LAB — HEPATITIS B CORE ANTIBODY, TOTAL: Hep B Core Total Ab: NEGATIVE

## 2015-06-03 LAB — HEPATITIS B SURFACE ANTIGEN: HEP B S AG: NEGATIVE

## 2015-06-03 MED ORDER — SODIUM CHLORIDE 0.9 % IV SOLN
INTRAVENOUS | Status: DC
  Administered 2015-06-03: 2.2 [IU]/h via INTRAVENOUS
  Filled 2015-06-03: qty 2.5

## 2015-06-03 MED ORDER — PHYTONADIONE 5 MG PO TABS
5.0000 mg | ORAL_TABLET | Freq: Once | ORAL | Status: AC
  Administered 2015-06-03: 5 mg via ORAL
  Filled 2015-06-03: qty 1

## 2015-06-03 MED ORDER — SODIUM CHLORIDE 0.9 % IV SOLN
Freq: Once | INTRAVENOUS | Status: AC
  Administered 2015-06-03: 14:00:00 via INTRAVENOUS

## 2015-06-03 MED ORDER — DEXTROSE 5 % IV SOLN
1.0000 g | INTRAVENOUS | Status: DC
  Administered 2015-06-03: 1 g via INTRAVENOUS
  Filled 2015-06-03: qty 10

## 2015-06-03 MED ORDER — DEXTROSE 5 % IV SOLN
1.0000 g | INTRAVENOUS | Status: DC
  Filled 2015-06-03: qty 1

## 2015-06-03 MED ORDER — DEXTROSE 5 % IV SOLN
2.0000 g | INTRAVENOUS | Status: AC
  Administered 2015-06-04 – 2015-06-15 (×12): 2 g via INTRAVENOUS
  Filled 2015-06-03 (×13): qty 2

## 2015-06-03 NOTE — Telephone Encounter (Signed)
[id number] [name]/ offered my services for any non medical needs.  Patient is currently in the icu at [facility] and doing well.  Had recent history of shoulder surgery. Now with hematoma of the right kidney but is resolving. Wife will call for any needs or questions./[name], [place]

## 2015-06-03 NOTE — Consult Note (Signed)
Subjective:  Patient feeling the same as yesterday. Flank pain still present. Happy that HD went well yesterday. Objective Vital signs in last 24 hours: Filed Vitals:   06/03/15 0700 06/03/15 0725 06/03/15 0800 06/03/15 1121  BP: 106/64  118/67   Pulse: 91  87   Temp:  98.7 F (37.1 C)  97.4 F (36.3 C)  TempSrc:  Oral  Oral  Resp: 20  17   Height:      Weight:      SpO2: 98%  96%    Weight change: 3 lb 12 oz (1.7 kg)  Intake/Output Summary (Last 24 hours) at 06/03/15 1308 Last data filed at 06/03/15 0800  Gross per 24 hour  Intake    720 ml  Output    120 ml  Net    600 ml    Assessment/ Plan: Pt is a 75 y.o. 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 Patient was admitted to the ICU for septic shock 2/2 suspected UTI. Patient's Cr was notably elevated at 9.33, when his baseline was thought to be around 1.7-1.9. PT/INR was elevated. Blood cultures were positive for Gram Neg Rods.  Assessment/Plan: 1. AKI/CKD, patient s/p left nephrectomy: Unknown etiology at this time. DDx includes ischemic ATN vs obstructive. No longer thinking HUS. Not on ACEi or NSAIDs. Positive blood culture for G-neg rods -- only one tube, no speciation at this time, recollection pending.  - Cr 8.96, BUN 93, Bicarb 21, Hgb 7.8 - HD yesterday: seemed to tolerate it better than the first try >> will undergo HD again tomorrow.  - CT abdomen 4/27: showed evidence of a perinephric hematoma (no hydronephrosis)   - Urology has been consulted: could hematoma be constricting solitary kidney in retroperitoneal space?   - Bleed likely spontaneous, concerning for CA, but INR has been supratherapeutic and patient received heparin during first HD session.  - Peripheral smear w/o schistocytes  - Haptoglobin is high at 335 (no hemolysis)  - DIC  panel: not conclusive; can r/o DIC, and high haptoglobin makes HUS unlikely  - Fibrinogen >800 (metastatic dz?) 2. Metabolic acidosis, anion gap: gap 15, likely 2/2 uremia. HD has helped this. 3. Hematologic: Acute blood loss anemia? -- perinephric hematoma present on CT - Hgb 7.8 (from 10.3) - transfuse as needed per primary.  4. Urosepsis, w/ shock: [place]. Management and pressors per CCM.  - currently not on pressers.  5. Retroperitoneal, perinephric hematoma with displacement of right kidney:  As above, will consult Urology to evaluate.  No evidence of hydro.  No heparin with HD and consider vit K for supra-therapeutic INR.     Labs: Basic Metabolic Panel:  Recent Labs Lab 06/01/15 0218 06/02/15 0217 06/03/15 0217  NA 129* 131* 133*  K 4.3 3.6 3.7  CL 100* 97* 97*  CO2 13* 14* 21*  GLUCOSE 194* 264* 210*  BUN 122* 111* 93*  CREATININE 9.33* 9.67* 8.96*  CALCIUM 7.2* 7.2* 7.3*  PHOS 5.1*  --  4.2   Liver Function Tests:  Recent Labs Lab 06/01/15 1043 06/03/15 0217  BILITOT 1.5*  --   ALBUMIN  --  1.7*   No results for input(s): LIPASE, AMYLASE in the last 168 hours. No results for input(s): AMMONIA in the last 168 hours. CBC:  Recent Labs Lab 05/31/15 1055 06/01/15 0218 06/01/15 1043 06/02/15 0217 06/03/15 0217  WBC 10.3 9.4  --  12.0* 13.3*  NEUTROABS 9.1*  --   --  10.2*  --   HGB 12.3* 10.3*  --  10.3* 7.8*  HCT 37.0* 31.1*  --  30.2* 23.5*  MCV 81.3 80.6  --  80.3 80.5  PLT 131* 124* 122* 132* 162   Cardiac Enzymes: No results for input(s): CKTOTAL, CKMB, CKMBINDEX, TROPONINI in the last 168 hours. CBG:  Recent Labs Lab 06/02/15 2005 06/02/15 2316 06/03/15 0321 06/03/15 0723 06/03/15 1120  GLUCAP 220* 207* 218* 204* 263*    Iron Studies: No results for input(s): IRON, TIBC, TRANSFERRIN, FERRITIN in the last 72 hours. Studies/Results: Ct Abdomen Pelvis Wo Contrast  06/02/2015  CLINICAL DATA:  Mid abdomen pain.  Acute renal failure, evaluate kidneys. EXAM: CT ABDOMEN AND PELVIS WITHOUT CONTRAST TECHNIQUE: Multidetector CT imaging of the abdomen and pelvis was performed following the standard protocol without IV contrast. COMPARISON:  March 15, 2014 FINDINGS: Lower chest: There are small bilateral pleural effusions. Mild atelectasis of the posterior lung bases are noted. The heart size is normal. Hepatobiliary: The liver is normal appearing on this noncontrast exam. No focal masses identified. Patient status post prior cholecystectomy. Pancreas: No mass or inflammatory process identified on this un-enhanced exam. Spleen: Within normal limits in size. Adrenals/Urinary Tract: The patient is status post left nephrectomy. There is a large right perinephric hematoma displacing the kidney superiorly and medially. There is no right hydronephrosis. Evaluation of right kidney is limited without intravenous contrast. The adrenal glands are normal. The bladder is decompressed with a Foley catheter in place. Stomach/Bowel: No evidence of obstruction, inflammatory process, or abnormal fluid collections. There is a hiatal hernia. There is diverticulosis of colon. Vascular/Lymphatic: No pathologically enlarged lymph nodes. No evidence of abdominal aortic aneurysm. There is atherosclerosis of the aorta. Reproductive: No mass or other significant abnormality. Other: There is ascites in the abdomen and pelvis. Musculoskeletal: Degenerative joint changes of the spine are identified. Stable focal sclerosis is identified in the right ilium unchanged. IMPRESSION: Large right perinephric hematoma displacing the right kidney superiorly and medially. There is no right hydronephrosis. Prior left nephrectomy. Small amount of ascites in the abdomen and pelvis. Bilateral pleural effusions. Electronically Signed   By: Abelardo Diesel M.D.   On: 06/02/2015 13:37   Dg Chest Port 1 View  06/02/2015  CLINICAL DATA:  Dyspnea EXAM: PORTABLE CHEST 1 VIEW  COMPARISON:  Yesterday FINDINGS: Borderline cardiomegaly. Stable right jugular central venous catheter with its tip in the mid SVC. Bibasilar hazy opacity is unchanged. Upper lungs clear. No pneumothorax. IMPRESSION: Stable bibasilar atelectasis versus airspace disease. Electronically Signed   By: Marybelle Killings M.D.   On: 06/02/2015 08:01   Dg Chest Port 1 View  06/01/2015  CLINICAL DATA:  Central line placement. EXAM: PORTABLE CHEST 1 VIEW COMPARISON:  05/31/2015. FINDINGS: 1430 hours. New right IJ central venous catheter extends to the mid SVC level. There are persistent low lung volumes with mildly increased bibasilar atelectasis. No definite edema, pleural effusion or pneumothorax. The heart size and mediastinal contours are stable. IMPRESSION: Interval central line placement as described without complicating pneumothorax. Mildly increased bibasilar atelectasis. Electronically Signed   By: Richardean Sale M.D.   On: 06/01/2015 14:42   Medications: Infusions:    Scheduled Medications: . [START ON 06/04/2015] cefTRIAXone (ROCEPHIN)  IV  2 g Intravenous Q24H  . insulin aspart  0-20 Units Subcutaneous Q4H  . pantoprazole  40 mg Oral Daily    have reviewed  scheduled and prn medications.  Physical Exam: General -- oriented x3, pleasant and cooperative.   Chest -- Lungs clear to auscultation. Cardiac -- RRR. No murmurs noted.  Abdomen -- soft, Rt sided tenderness (flank). No masses palpable. Bowel sounds present. Extremeties - +2 edema bilaterally to tibial tuberosity (improved from yesterday). Dorsalis pedis pulses present and symmetrical.    Elberta Leatherwood, MD,MS,  PGY2 06/03/2015 1:08 PM   LOS: 3 days   I have seen and examined this patient and agree with plan as outlined by Dr. Alease Frame.  Mr. Townes did well with HD yesterday, however his right flank pain was concerning due to solitary remaining kidney.  CT scan revealed large perirenal retroperitoneal hematoma with displacement of the right  kidney.  No heparin with HD and may benefit from vit K due to supratherapeutic INR.  Would also recommend Urology evaluation of hematoma.  Will likely require HD again tomorrow (without heparin) if no significant improvement of UOP. Broadus John A Yetunde Leis,MD 06/03/2015 1:10 PM

## 2015-06-03 NOTE — Progress Notes (Signed)
PULMONARY / CRITICAL CARE MEDICINE   Name: [name] MRN: [id number] DOB: 29-Mar-1940    ADMISSION DATE:  05/31/2015 CONSULTATION DATE:  05/31/15  REFERRING MD:  EDP  CHIEF COMPLAINT:  Hypotension  HISTORY OF PRESENT ILLNESS:  [name] is a 75 y.o. male with PMH as outlined below including prostate CA s/p radiation 2011 and RCC s/p left nephrectomy.  He was seen in ED 4/22 for clogged catheter which was irrigated and replaced.  He apparently has had hematuria for quite some time which has been attributed to scarring from prior radiation.  He had been in his USOH until roughly 4/20 when he had decreased appetite.  He had not been eating or drinking for the past 5 days. On 04/25 he went to have his INR checked (on coumadin for A.fib and hx DVT / PE) and was found to have INR of 8 and was hypotensive with SBP 70/44.  He was subsequently sent to ED for further evaluation.  In ED, he was found to have UTI and despite 4.5L IVF, he remained hypotensive.  He also had multiple metabolic derangements including Na 124, K 5.0, AGMA, lactate 3, SCr 9.36. PCCM was therefore consulted for admission of septic shock due to urosepsis as well as AKI.    No current facility-administered medications on file prior to encounter.   Current Outpatient Prescriptions on File Prior to Encounter  Medication Sig  . carvedilol (COREG) 3.125 MG tablet take 1 tablet by mouth twice a day with meals  . fenofibrate (TRICOR) 145 MG tablet Take 145 mg by mouth daily.   . ferrous sulfate 325 (65 FE) MG EC tablet take 1 tablet by mouth once daily  . furosemide (LASIX) 40 MG tablet take 1 tablet by mouth once daily  . HYDROcodone-acetaminophen (NORCO/VICODIN) 5-325 MG tablet Take 1-2 tablets by mouth every 4 (four) hours as needed (breakthrough pain).  . Niacin CR 1000 MG TBCR Take 1,000 mg by mouth at bedtime.   . polyvinyl alcohol (LIQUIFILM TEARS) 1.4 % ophthalmic solution Place 1 drop into both eyes 2 (two) times  daily as needed for dry eyes.  . potassium chloride (K-DUR,KLOR-CON) 10 MEQ tablet take 1 tablet by mouth once daily  . quinapril (ACCUPRIL) 20 MG tablet Take 20 mg by mouth daily.   Marland Kitchen warfarin (COUMADIN) 1 MG tablet Take 1 mg by mouth every Monday, Wednesday, and Friday.  . warfarin (COUMADIN) 5 MG tablet Take 5-6 mg by mouth daily. 6 mg on Monday, Wednesday, and Friday.  Take 5 mg on Tuesday, Thursday, Saturday, and Sunday.    SUBJECTIVE:  No acute events overnight. Tolerated HD yesterday. No complaints this AM.  VITAL SIGNS: BP 107/64 mmHg  Pulse 91  Temp(Src) 98 F (36.7 C) (Oral)  Resp 24  Ht 5\' 10"  (1.778 m)  Wt 288 lb 2.3 oz (130.7 kg)  BMI 41.34 kg/m2  SpO2 97%  HEMODYNAMICS:    VENTILATOR SETTINGS:  N/A  INTAKE / OUTPUT: I/O last 3 completed shifts: In: 1700 [P.O.:1140; I.V.:510; IV Piggyback:50] Out: 250 [Urine:250]   PHYSICAL EXAMINATION: General: Adult man in bed in NAD  Neuro: A&O x 3, responding appropriately HEENT: [place]/AT Cardiovascular: RRR, no M/R/G.  Lungs: CTA B/L Abdomen: obese, BS, soft, ND, NT Musculoskeletal: +1 B/L LE edema  Skin: Intact, warm, no rashes.  LABS:  BMET  Recent Labs Lab 06/01/15 0218 06/02/15 0217 06/03/15 0217  NA 129* 131* 133*  K 4.3 3.6 3.7  CL 100* 97* 97*  CO2 13* 14* 21*  BUN 122* 111* 93*  CREATININE 9.33* 9.67* 8.96*  GLUCOSE 194* 264* 210*    Electrolytes  Recent Labs Lab 06/01/15 0218 06/02/15 0217 06/03/15 0217  CALCIUM 7.2* 7.2* 7.3*  MG 2.1  --   --   PHOS 5.1*  --  4.2    CBC  Recent Labs Lab 06/01/15 0218 06/01/15 1043 06/02/15 0217 06/03/15 0217  WBC 9.4  --  12.0* 13.3*  HGB 10.3*  --  10.3* 7.8*  HCT 31.1*  --  30.2* 23.5*  PLT 124* 122* 132* 162    Coag's  Recent Labs Lab 06/01/15 1043 06/02/15 0217 06/03/15 0217  APTT 31  --   --   INR 2.53*  2.79* 2.12* 3.13*    Sepsis Markers  Recent Labs Lab 05/31/15 1145 05/31/15 1352 05/31/15 1444  LATICACIDVEN  3.19* 2.6* 1.68    ABG No results for input(s): PHART, PCO2ART, PO2ART in the last 168 hours.  Liver Enzymes  Recent Labs Lab 06/01/15 1043 06/03/15 0217  BILITOT 1.5*  --   ALBUMIN  --  1.7*    Cardiac Enzymes No results for input(s): TROPONINI, PROBNP in the last 168 hours.  Glucose  Recent Labs Lab 06/02/15 0729 06/02/15 1114 06/02/15 1533 06/02/15 2005 06/02/15 2316 06/03/15 0321  GLUCAP 257* 346* 262* 220* 207* 218*    Imaging Ct Abdomen Pelvis Wo Contrast  06/02/2015  CLINICAL DATA:  Mid abdomen pain. Acute renal failure, evaluate kidneys. EXAM: CT ABDOMEN AND PELVIS WITHOUT CONTRAST TECHNIQUE: Multidetector CT imaging of the abdomen and pelvis was performed following the standard protocol without IV contrast. COMPARISON:  March 15, 2014 FINDINGS: Lower chest: There are small bilateral pleural effusions. Mild atelectasis of the posterior lung bases are noted. The heart size is normal. Hepatobiliary: The liver is normal appearing on this noncontrast exam. No focal masses identified. Patient status post prior cholecystectomy. Pancreas: No mass or inflammatory process identified on this un-enhanced exam. Spleen: Within normal limits in size. Adrenals/Urinary Tract: The patient is status post left nephrectomy. There is a large right perinephric hematoma displacing the kidney superiorly and medially. There is no right hydronephrosis. Evaluation of right kidney is limited without intravenous contrast. The adrenal glands are normal. The bladder is decompressed with a Foley catheter in place. Stomach/Bowel: No evidence of obstruction, inflammatory process, or abnormal fluid collections. There is a hiatal hernia. There is diverticulosis of colon. Vascular/Lymphatic: No pathologically enlarged lymph nodes. No evidence of abdominal aortic aneurysm. There is atherosclerosis of the aorta. Reproductive: No mass or other significant abnormality. Other: There is ascites in the abdomen and  pelvis. Musculoskeletal: Degenerative joint changes of the spine are identified. Stable focal sclerosis is identified in the right ilium unchanged. IMPRESSION: Large right perinephric hematoma displacing the right kidney superiorly and medially. There is no right hydronephrosis. Prior left nephrectomy. Small amount of ascites in the abdomen and pelvis. Bilateral pleural effusions. Electronically Signed   By: Abelardo Diesel M.D.   On: 06/02/2015 13:37   Dg Chest Port 1 View  06/02/2015  CLINICAL DATA:  Dyspnea EXAM: PORTABLE CHEST 1 VIEW COMPARISON:  Yesterday FINDINGS: Borderline cardiomegaly. Stable right jugular central venous catheter with its tip in the mid SVC. Bibasilar hazy opacity is unchanged. Upper lungs clear. No pneumothorax. IMPRESSION: Stable bibasilar atelectasis versus airspace disease. Electronically Signed   By: Marybelle Killings M.D.   On: 06/02/2015 08:01    STUDIES:  CXR 04/25 > Low volumes. LLL opacity may reflect atelectasis  or infiltrate. Renal US 04/25 > surgically absent left kidney, no hydronephrosis, small right renal cyst.  CULTURES: Blood 04/25 > klebsiella  Urine 04/25 >  Multiple species present MRSA nares > negative Blood 04/27 > NGTD  ANTIBIOTICS: Ceftaz 04/25 > Vanc 04/25 > 04/25  SIGNIFICANT EVENTS: 04/25 > admitted with septic shock due to UTI.  Significant ARF.  LINES/TUBES: PIV  DISCUSSION: 75 y.o. M with hx RCC and prostate CA s/p radiation 2011 and left nephrectomy with increased INR, ARF, perinephric hematoma.  ASSESSMENT / PLAN:  CARDIOVASCULAR A:  Shock - primarily 2/2 UTI.  Better with fluids.  Not on pressors. Hx a.fib (on coumadin), HLD HTN - normotensive P:  Continue to hold outpatient carvedilol, fenofibrate, furosemide, niacin, quinapril, coumadin.  INFECTIOUS A:   Septic shock - likely urine source.  Has hx of E.coli and pseudomonas UTI in the past.  Blood cx - klebsiella  P:   STOP ceftazidime, start ceftriaxone for  Klebsiella Follow-up cx.  PULMONARY A: Acute hypoxic respiratory failure - resolved Atelectasis. 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 Hold coumadin given elevated INR Heparin gtt once INR < 2 and if no evidence of bleeding  RENAL A:   ARF AGMA - due to lactate, uremia; improving Hyponatremia - slowly improving Mild hyperkalemia - resolved Chronic foley w/hematuria at admission - improving, likely 2/2 infx Perinephric hematoma - etiology could be benign cause, idiopathic or recurrence of malignancy given his RCC hx; hgb down 10.3 to 7.8 in 24 hours P:   Daily RFP Replace electrolytes prn Nephrology consulted given anuria, appreciate recs; continue intermittent HD Urology consulted given hx of RCC w/ current perinephric hematoma concerning for possible RCC recurrence; appreciate recommendations.  GASTROINTESTINAL A:   Obesity. Nutrition - didn't tolerate renal diet, back to clears overnight P:   Clear liquid, ADAT PPI for SUP  HEMATOLOGIC / ONCOLOGIC A:   Supratherapeutic INR - reportedly 8 at outpatient check 04/25; had been downtrending now back up to 3.13 today w/ drop in hgb. 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 No evidence of hemolysis or DIC; abnormalities likely due to severe infx Anemia - hgb drop from 10.3 to 7.8 today   P:  Repeat FFP today and give 5mg  po vit K for elevated INR and hgb drop Monitor INR, hgb, plt SCD's for now  ENDOCRINE A:   DM - diet controlled.  CBGs 207 - 346 in past 24 hours. P:   Convert SSI-R to insulin gtt  CBGs ac/hs  NEUROLOGIC A:   No acute issues. P:   No interventions required.  Family updated: wife updated at bedside  Interdisciplinary Family Meeting v Palliative Care Meeting:  Due by: 06/05/15.  Duwaine Maxin, DO IMTS PGY3 on PCCM Service [phone] 06/03/2015, 7:18 AM

## 2015-06-03 NOTE — Progress Notes (Signed)
RT placed patient on CPAP HS. RT placed patient on Auto titrate. Patient tolerating well. No O2 bleed in needed. RT will continue to monitor as needed.

## 2015-06-03 NOTE — Progress Notes (Addendum)
Inpatient Diabetes Program Recommendations  AACE/ADA: New Consensus Statement on Inpatient Glycemic Control (2015)  Target Ranges:  Prepandial:   less than 140 mg/dL      Peak postprandial:   less than 180 mg/dL (1-2 hours)      Critically ill patients:  140 - 180 mg/dL   Review of Glycemic Control  Noted patient with renal hematoma. Glucose remains high in 300's despite q 4 hrs resistant correction scale, requiring 7-15 units q 4 hrs. Inpatient Diabetes   Program Recommendations: Please consider addition of basal levemir at  15-20 units in addition to the correction insulin.  Thank you Rosita Kea, RN, MSN, CDE  Diabetes Inpatient Program Office: [phone] Pager: [phone] 8:00 am to 5:00 pm

## 2015-06-03 NOTE — Consult Note (Signed)
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; on coumadin for DVT and INR was 9.33; blood cultures positive for gm negative rods; on IV meds for sepsis; long tem foley catheter and recurrent hematuria; tx for urosepsis/hypoxia/renal injury; SOB and acidotic;   Hb 10.3 to 7.8 last 24 hours but stable; FFP etc for abnormal INR: srum CR 8.96 followed by nephrology; (serum Cr was 1.55 04/29/15)  CTscan: large rt perinephric hematoma displacing single kidney; no hydro; decompressed bladder;   Right flank pain gone; urine now clear; patient has not received blood this admission  Modifying factors: There are no other modifying factors  Associated signs and symptoms: There are no other associated signs and symptoms Aggravating and relieving factors: There are no other aggravating or relieving factors Severity: Moderate Duration: Persistent  Past Medical History  Diagnosis Date  . Obesity   . Phlebitis     Lower extermity  . Pulmonary emboli ([place]) 2008    submassive, saddle  . Prostate cancer ([place]) 07/2009  . Sleep apnea     on CPAP  . Hx of echocardiogram 12/04/2010    Normal EF >55% no significant valve disease  . History of stress test 06/27/2009    Low risk and EF of approximately 50%  . DVT (deep venous thrombosis) ([place])   . Chronic kidney disease, stage 3     baseline creatinine ~1.4  . HLD (hyperlipidemia)   . HTN (hypertension)   . Dysrhythmia     A fib  . Diabetes mellitus ([place])     diet controlled  . History of hiatal hernia    Past Surgical History  Procedure Laterality Date  . Nephrectomy  1999    for CA  . Cholecystectomy   1999  . Transurethral resection of bladder tumor N/A 03/04/2013    Procedure: CYSTOSCOPY WITH RIGHT RETROGRADE PYELOGRAM AND BLADDER BIOPSY /CLOT EVACUATION/ BIOPSY PROSTATIC URETHRA WITH FULGERATION ;  Surgeon: Molli Hazard, MD;  Location: [facility];  Service: Urology;  Laterality: N/A;  . Cystoscopy w/ retrogrades N/A 04/08/2013    Procedure: CYSTOSCOPY WITH CLOT EVACUATION;  Surgeon: Molli Hazard, MD;  Location: [facility];  Service: Urology;  Laterality: N/A;  . Left shoulder repair    . Shoulder open rotator cuff repair Right 05/03/2015    Procedure: RIGHT SHOULDER ROTATOR CUFF REPAIR OPEN WITH GRAFT AND ANCHOR ;  Surgeon: Latanya Maudlin, MD;  Location: [facility];  Service: Orthopedics;  Laterality: Right;    Medications: I have reviewed the patient's current medications. Allergies: No Known Allergies  Family History  Problem Relation Age of Onset  . Cancer Mother   . Diabetes Father   . Cancer Father    Social History:  reports that he has never smoked. He has never used smokeless tobacco. He reports that he drinks alcohol. He reports that he does not use illicit drugs.  ROS: All systems are reviewed and negative except as noted. Rest negative  Physical Exam:  Vital signs in last 24 hours: Temp:  [97.4 F (36.3 C)-99 F (37.2 C)] 97.4 F (36.3 C) (04/28 1121) Pulse Rate:  [83-103] 87 (04/28 0800) Resp:  [0-41] 17 (04/28 0800) BP: (72-142)/(39-87) 118/67 mmHg (04/28 0800) SpO2:  [92 %-100 %]  96 % (04/28 0800) Weight:  [130.7 kg (288 lb 2.3 oz)] 130.7 kg (288 lb 2.3 oz) (04/27 1725)  Cardiovascular: Skin warm; not flushed Respiratory: Breaths quiet; no shortness of breath Abdomen: No masses Neurological: Normal sensation to touch Musculoskeletal: Normal motor function arms and legs Lymphatics: No inguinal adenopathy Skin: No rashes Genitourinary:no visual flank hematoma; urine clear with foley   Laboratory Data:  Results for orders placed or performed during the  hospital encounter of 05/31/15 (from the past 72 hour(s))  Lactic acid, plasma     Status: Abnormal   Collection Time: 05/31/15  1:52 PM  Result Value Ref Range   Lactic Acid, Venous 2.6 (HH) 0.5 - 2.0 mmol/L    Comment: CRITICAL RESULT CALLED TO, READ BACK BY AND VERIFIED WITH: D MCKINNEY,RN 1712 05/31/2015 WBOND   ABO/Rh     Status: None   Collection Time: 05/31/15  2:25 PM  Result Value Ref Range   ABO/RH(D) A POS   Prepare fresh frozen plasma     Status: None   Collection Time: 05/31/15  2:41 PM  Result Value Ref Range   Unit Number [id number]    Blood Component Type THAWED PLASMA    Unit division 00    Status of Unit ISSUED,FINAL    Transfusion Status OK TO TRANSFUSE    Unit Number [id number]    Blood Component Type THAWED PLASMA    Unit division 00    Status of Unit ISSUED,FINAL    Transfusion Status OK TO TRANSFUSE   Type and screen [place]     Status: None   Collection Time: 05/31/15  2:41 PM  Result Value Ref Range   ABO/RH(D) A POS    Antibody Screen NEG    Sample Expiration 06/03/2015   I-Stat CG4 Lactic Acid, ED     Status: None   Collection Time: 05/31/15  2:44 PM  Result Value Ref Range   Lactic Acid, Venous 1.68 0.5 - 2.0 mmol/L  MRSA PCR Screening     Status: None   Collection Time: 05/31/15  3:44 PM  Result Value Ref Range   MRSA by PCR NEGATIVE NEGATIVE    Comment:        The GeneXpert MRSA Assay (FDA approved for NASAL specimens only), is one component of a comprehensive MRSA colonization surveillance program. It is not intended to diagnose MRSA infection nor to guide or monitor treatment for MRSA infections.   Protime-INR     Status: Abnormal   Collection Time: 05/31/15  3:53 PM  Result Value Ref Range   Prothrombin Time >90.0 (H) 11.6 - 15.2 seconds    Comment: SPECIMEN CHECKED FOR CLOTS   INR >10.00 (HH) 0.00 - 1.49    Comment: SPECIMEN CHECKED FOR CLOTS REPEATED TO VERIFY CRITICAL RESULT CALLED TO, READ BACK  BY AND VERIFIED WITH: P MCFINNEY,RN AT 1716 05/31/15 BY K BARR   Basic metabolic panel     Status: Abnormal   Collection Time: 05/31/15  3:53 PM  Result Value Ref Range   Sodium 129 (L) 135 - 145 mmol/L   Potassium 5.0 3.5 - 5.1 mmol/L   Chloride 99 (L) 101 - 111 mmol/L   CO2 16 (L) 22 - 32 mmol/L   Glucose, Bld 222 (H) 65 - 99 mg/dL   BUN 114 (H) 6 - 20 mg/dL   Creatinine, Ser 9.07 (H) 0.61 - 1.24 mg/dL   Calcium 7.4 (L) 8.9 - 10.3 mg/dL   GFR calc non Af  Amer 5 (L) >60 mL/min   GFR calc Af Amer 6 (L) >60 mL/min    Comment: (NOTE) The eGFR has been calculated using the CKD EPI equation. This calculation has not been validated in all clinical situations. eGFR's persistently <60 mL/min signify possible Chronic Kidney Disease.    Anion gap 14 5 - 15  Cortisol     Status: None   Collection Time: 05/31/15  3:53 PM  Result Value Ref Range   Cortisol, Plasma 96.6 ug/dL    Comment: RESULTS CONFIRMED BY MANUAL DILUTION (NOTE) AM    6.7 - 22.6 ug/dL PM   <10.0       ug/dL   Glucose, capillary     Status: Abnormal   Collection Time: 05/31/15  4:02 PM  Result Value Ref Range   Glucose-Capillary 237 (H) 65 - 99 mg/dL  Culture, blood (routine x 2)     Status: Abnormal (Preliminary result)   Collection Time: 05/31/15  4:05 PM  Result Value Ref Range   Specimen Description BLOOD RIGHT HAND    Special Requests IN PEDIATRIC BOTTLE 2CC    Culture  Setup Time      GRAM NEGATIVE RODS AEROBIC BOTTLE ONLY CRITICAL RESULT CALLED TO, READ BACK BY AND VERIFIED WITH: Renette Butters '@0617'  06/01/15 MKELLY    Culture (A)     KLEBSIELLA PNEUMONIAE SUSCEPTIBILITIES PERFORMED ON PREVIOUS CULTURE WITHIN THE LAST 5 DAYS.    Report Status PENDING   Culture, blood (routine x 2)     Status: Abnormal (Preliminary result)   Collection Time: 05/31/15  4:15 PM  Result Value Ref Range   Specimen Description BLOOD RIGHT ANTECUBITAL    Special Requests IN PEDIATRIC BOTTLE 3CC    Culture  Setup Time      GRAM  NEGATIVE RODS AEROBIC BOTTLE ONLY CRITICAL RESULT CALLED TO, READ BACK BY AND VERIFIED WITH: Ferrel Logan ,RN @ [phone] 06/01/15 MKELLY    Culture (A)     KLEBSIELLA PNEUMONIAE SUSCEPTIBILITIES PERFORMED ON PREVIOUS CULTURE WITHIN THE LAST 5 DAYS.    Report Status PENDING   Glucose, capillary     Status: Abnormal   Collection Time: 05/31/15  8:04 PM  Result Value Ref Range   Glucose-Capillary 190 (H) 65 - 99 mg/dL   Comment 1 Notify RN   Protime-INR     Status: Abnormal   Collection Time: 05/31/15 10:51 PM  Result Value Ref Range   Prothrombin Time 58.4 (H) 11.6 - 15.2 seconds   INR 7.08 (HH) 0.00 - 1.49    Comment: CRITICAL RESULT CALLED TO, READ BACK BY AND VERIFIED WITH: CLIFTON,J RN 05/31/2015 2344 JORDANS SPECIMEN CHECKED FOR CLOTS REPEATED TO VERIFY   Basic metabolic panel     Status: Abnormal   Collection Time: 05/31/15 10:51 PM  Result Value Ref Range   Sodium 129 (L) 135 - 145 mmol/L   Potassium 4.2 3.5 - 5.1 mmol/L   Chloride 99 (L) 101 - 111 mmol/L   CO2 14 (L) 22 - 32 mmol/L   Glucose, Bld 186 (H) 65 - 99 mg/dL   BUN 117 (H) 6 - 20 mg/dL   Creatinine, Ser 9.41 (H) 0.61 - 1.24 mg/dL   Calcium 7.3 (L) 8.9 - 10.3 mg/dL   GFR calc non Af Amer 5 (L) >60 mL/min   GFR calc Af Amer 6 (L) >60 mL/min    Comment: (NOTE) The eGFR has been calculated using the CKD EPI equation. This calculation has not been validated in all clinical  situations. eGFR's persistently <60 mL/min signify possible Chronic Kidney Disease.    Anion gap 16 (H) 5 - 15  Glucose, capillary     Status: Abnormal   Collection Time: 05/31/15 11:38 PM  Result Value Ref Range   Glucose-Capillary 182 (H) 65 - 99 mg/dL   Comment 1 Notify RN   CBC     Status: Abnormal   Collection Time: 06/01/15  2:18 AM  Result Value Ref Range   WBC 9.4 4.0 - 10.5 K/uL   RBC 3.86 (L) 4.22 - 5.81 MIL/uL   Hemoglobin 10.3 (L) 13.0 - 17.0 g/dL   HCT 31.1 (L) 39.0 - 52.0 %   MCV 80.6 78.0 - 100.0 fL   MCH 26.7 26.0 - 34.0 pg    MCHC 33.1 30.0 - 36.0 g/dL   RDW 16.2 (H) 11.5 - 15.5 %   Platelets 124 (L) 150 - 400 K/uL  Basic metabolic panel     Status: Abnormal   Collection Time: 06/01/15  2:18 AM  Result Value Ref Range   Sodium 129 (L) 135 - 145 mmol/L   Potassium 4.3 3.5 - 5.1 mmol/L   Chloride 100 (L) 101 - 111 mmol/L   CO2 13 (L) 22 - 32 mmol/L   Glucose, Bld 194 (H) 65 - 99 mg/dL   BUN 122 (H) 6 - 20 mg/dL   Creatinine, Ser 9.33 (H) 0.61 - 1.24 mg/dL   Calcium 7.2 (L) 8.9 - 10.3 mg/dL   GFR calc non Af Amer 5 (L) >60 mL/min   GFR calc Af Amer 6 (L) >60 mL/min    Comment: (NOTE) The eGFR has been calculated using the CKD EPI equation. This calculation has not been validated in all clinical situations. eGFR's persistently <60 mL/min signify possible Chronic Kidney Disease.    Anion gap 16 (H) 5 - 15  Magnesium     Status: None   Collection Time: 06/01/15  2:18 AM  Result Value Ref Range   Magnesium 2.1 1.7 - 2.4 mg/dL  Phosphorus     Status: Abnormal   Collection Time: 06/01/15  2:18 AM  Result Value Ref Range   Phosphorus 5.1 (H) 2.5 - 4.6 mg/dL  Protime-INR     Status: Abnormal   Collection Time: 06/01/15  2:18 AM  Result Value Ref Range   Prothrombin Time 60.8 (H) 11.6 - 15.2 seconds   INR 7.47 (HH) 0.00 - 1.49    Comment: CRITICAL RESULT CALLED TO, READ BACK BY AND VERIFIED WITH: CLIFTON,J RN 06/01/2015 0658 JORDANS CONSISTENT WITH PREVIOUS RESULT   Glucose, capillary     Status: Abnormal   Collection Time: 06/01/15  3:30 AM  Result Value Ref Range   Glucose-Capillary 184 (H) 65 - 99 mg/dL  Glucose, capillary     Status: Abnormal   Collection Time: 06/01/15  7:16 AM  Result Value Ref Range   Glucose-Capillary 172 (H) 65 - 99 mg/dL  Protime-INR     Status: Abnormal   Collection Time: 06/01/15 10:43 AM  Result Value Ref Range   Prothrombin Time 29.0 (H) 11.6 - 15.2 seconds   INR 2.79 (H) 0.00 - 1.49  DIC (disseminated intravasc coag) panel     Status: Abnormal   Collection Time:  06/01/15 10:43 AM  Result Value Ref Range   Prothrombin Time 26.9 (H) 11.6 - 15.2 seconds   INR 2.53 (H) 0.00 - 1.49   aPTT 31 24 - 37 seconds   Fibrinogen >800 (H) 204 - 475 mg/dL  D-Dimer, Quant 2.76 (H) 0.00 - 0.50 ug/mL-FEU    Comment: (NOTE) At the manufacturer cut-off of 0.50 ug/mL FEU, this assay has been documented to exclude PE with a sensitivity and negative predictive value of 97 to 99%.  At this time, this assay has not been approved by the FDA to exclude DVT/VTE. Results should be correlated with clinical presentation.    Platelets 122 (L) 150 - 400 K/uL   Smear Review NO SCHISTOCYTES SEEN   Save smear     Status: None   Collection Time: 06/01/15 10:43 AM  Result Value Ref Range   Smear Review SMEAR STAINED AND AVAILABLE FOR REVIEW   Lactate dehydrogenase     Status: None   Collection Time: 06/01/15 10:43 AM  Result Value Ref Range   LDH 167 98 - 192 U/L  Haptoglobin     Status: Abnormal   Collection Time: 06/01/15 10:43 AM  Result Value Ref Range   Haptoglobin 335 (H) 34 - 200 mg/dL    Comment: (NOTE) Performed At: [facility] [address] [place], [place] [id number] Lindon Romp MD CV:[id number]   Bilirubin, total     Status: Abnormal   Collection Time: 06/01/15 10:43 AM  Result Value Ref Range   Total Bilirubin 1.5 (H) 0.3 - 1.2 mg/dL  Bilirubin, direct     Status: None   Collection Time: 06/01/15 10:43 AM  Result Value Ref Range   Bilirubin, Direct 0.5 0.1 - 0.5 mg/dL  Glucose, capillary     Status: Abnormal   Collection Time: 06/01/15 11:48 AM  Result Value Ref Range   Glucose-Capillary 246 (H) 65 - 99 mg/dL  Prepare fresh frozen plasma     Status: None   Collection Time: 06/01/15 12:20 PM  Result Value Ref Range   Unit Number [id number]    Blood Component Type THAWED PLASMA    Unit division 00    Status of Unit ISSUED,FINAL    Transfusion Status OK TO TRANSFUSE    Unit Number [id number]    Blood Component Type THAWED  PLASMA    Unit division 00    Status of Unit ISSUED,FINAL    Transfusion Status OK TO TRANSFUSE   Glucose, capillary     Status: Abnormal   Collection Time: 06/01/15  3:00 PM  Result Value Ref Range   Glucose-Capillary 229 (H) 65 - 99 mg/dL  Kappa/lambda light chains     Status: Abnormal   Collection Time: 06/01/15  4:56 PM  Result Value Ref Range   Kappa free light chain 60.05 (H) 3.30 - 19.40 mg/L   Lamda free light chains 36.34 (H) 5.71 - 26.30 mg/L   Kappa, lamda light chain ratio 1.65 0.26 - 1.65    Comment: (NOTE) Performed At: [facility] [address] [place], [place] [id number] Lindon Romp MD OI:[id number]   ANA, IFA (with reflex)     Status: None   Collection Time: 06/01/15  4:56 PM  Result Value Ref Range   ANA Ab, IFA Negative     Comment: (NOTE)                                     Negative   <1:80  Borderline  1:80                                     Positive   >1:80 Performed At: [facility] [place], [place] [id number] Lindon Romp MD TD:[id number]   Mpo/pr-3 (anca) antibodies     Status: None   Collection Time: 06/01/15  4:56 PM  Result Value Ref Range   Myeloperoxidase Abs <9.0 0.0 - 9.0 U/mL   ANCA Proteinase 3 <3.5 0.0 - 3.5 U/mL    Comment: (NOTE) Performed At: [facility] [address] [place], [place] [id number] Lindon Romp MD YQ:[id number]   C3 complement     Status: None   Collection Time: 06/01/15  4:56 PM  Result Value Ref Range   C3 Complement 91 82 - 167 mg/dL    Comment: (NOTE) Performed At: [facility] [address] [place], [place] [id number] Lindon Romp MD TU:[id number]   C4 complement     Status: None   Collection Time: 06/01/15  4:56 PM  Result Value Ref Range   Complement C4, Body Fluid 17 14 - 44 mg/dL    Comment: (NOTE) Performed At: [facility] [place], [place] [id number] Lindon Romp MD  XY:[id number]   Complement, total     Status: None   Collection Time: 06/01/15  4:56 PM  Result Value Ref Range   Compl, Total (CH50) 50 42 - 60 U/mL    Comment: (NOTE) Performed At: [facility] [place], [place] [id number] Lindon Romp MD GB:[id number]   Anti-DNA antibody, double-stranded     Status: None   Collection Time: 06/01/15  4:56 PM  Result Value Ref Range   ds DNA Ab <1 0 - 9 IU/mL    Comment: (NOTE)                                   Negative      <5                                   Equivocal  5 - 9                                   Positive      >9 Performed At: [facility] [place], [place] [id number] Lindon Romp MD IY:[id number]   Antistreptolysin O titer     Status: None   Collection Time: 06/01/15  4:56 PM  Result Value Ref Range   ASO 72.0 0.0 - 200.0 IU/mL    Comment: (NOTE) Performed At: [facility] [address] [place], [place] [id number] Lindon Romp MD RP:[id number]   Glucose, capillary     Status: Abnormal   Collection Time: 06/01/15  7:23 PM  Result Value Ref Range   Glucose-Capillary 291 (H) 65 - 99 mg/dL  Glucose, capillary     Status: Abnormal   Collection Time: 06/01/15 11:20 PM  Result Value Ref Range   Glucose-Capillary 230 (H) 65 - 99 mg/dL  Hepatitis B surface antigen     Status: None   Collection Time: 06/02/15 12:04 AM  Result Value Ref Range  Hepatitis B Surface Ag Negative Negative    Comment: (NOTE) Performed At: [facility] [place], [place] [id number] Lindon Romp MD YT:[id number]   Hepatitis B core antibody, total     Status: None   Collection Time: 06/02/15 12:07 AM  Result Value Ref Range   Hep B Core Total Ab Negative Negative    Comment: (NOTE) Performed At: [facility] [place], [place] [id number] Lindon Romp MD BS:[id number]   Hepatitis B surface antibody     Status: None   Collection  Time: 06/02/15 12:07 AM  Result Value Ref Range   Hep B S Ab Non Reactive     Comment: (NOTE)              Non Reactive: Inconsistent with immunity,                            less than 10 mIU/mL              Reactive:     Consistent with immunity,                            greater than 9.9 mIU/mL Performed At: [facility] [place], [place] [id number] Lindon Romp MD SV:[id number]   Protime-INR     Status: Abnormal   Collection Time: 06/02/15  2:17 AM  Result Value Ref Range   Prothrombin Time 23.6 (H) 11.6 - 15.2 seconds   INR 2.12 (H) 0.00 - 1.49  CBC with Differential/Platelet     Status: Abnormal   Collection Time: 06/02/15  2:17 AM  Result Value Ref Range   WBC 12.0 (H) 4.0 - 10.5 K/uL   RBC 3.76 (L) 4.22 - 5.81 MIL/uL   Hemoglobin 10.3 (L) 13.0 - 17.0 g/dL   HCT 30.2 (L) 39.0 - 52.0 %   MCV 80.3 78.0 - 100.0 fL   MCH 27.4 26.0 - 34.0 pg   MCHC 34.1 30.0 - 36.0 g/dL   RDW 16.5 (H) 11.5 - 15.5 %   Platelets 132 (L) 150 - 400 K/uL   Neutrophils Relative % 85 %   Lymphocytes Relative 7 %   Monocytes Relative 8 %   Eosinophils Relative 0 %   Basophils Relative 0 %   Neutro Abs 10.2 (H) 1.7 - 7.7 K/uL   Lymphs Abs 0.8 0.7 - 4.0 K/uL   Monocytes Absolute 1.0 0.1 - 1.0 K/uL   Eosinophils Absolute 0.0 0.0 - 0.7 K/uL   Basophils Absolute 0.0 0.0 - 0.1 K/uL   WBC Morphology MILD LEFT SHIFT (1-5% METAS, OCC MYELO, OCC BANDS)     Comment: TOXIC GRANULATION  Basic metabolic panel     Status: Abnormal   Collection Time: 06/02/15  2:17 AM  Result Value Ref Range   Sodium 131 (L) 135 - 145 mmol/L   Potassium 3.6 3.5 - 5.1 mmol/L   Chloride 97 (L) 101 - 111 mmol/L   CO2 14 (L) 22 - 32 mmol/L   Glucose, Bld 264 (H) 65 - 99 mg/dL   BUN 111 (H) 6 - 20 mg/dL   Creatinine, Ser 9.67 (H) 0.61 - 1.24 mg/dL   Calcium 7.2 (L) 8.9 - 10.3 mg/dL   GFR calc non Af Amer 5 (L) >60 mL/min   GFR calc Af Amer 5 (L) >60 mL/min    Comment: (NOTE) The eGFR  has been  calculated using the CKD EPI equation. This calculation has not been validated in all clinical situations. eGFR's persistently <60 mL/min signify possible Chronic Kidney Disease.    Anion gap 20 (H) 5 - 15  Glucose, capillary     Status: Abnormal   Collection Time: 06/02/15  3:36 AM  Result Value Ref Range   Glucose-Capillary 255 (H) 65 - 99 mg/dL  Glucose, capillary     Status: Abnormal   Collection Time: 06/02/15  7:29 AM  Result Value Ref Range   Glucose-Capillary 257 (H) 65 - 99 mg/dL   Comment 1 Notify RN   Glucose, capillary     Status: Abnormal   Collection Time: 06/02/15 11:14 AM  Result Value Ref Range   Glucose-Capillary 346 (H) 65 - 99 mg/dL  Glucose, capillary     Status: Abnormal   Collection Time: 06/02/15  3:33 PM  Result Value Ref Range   Glucose-Capillary 262 (H) 65 - 99 mg/dL  Glucose, capillary     Status: Abnormal   Collection Time: 06/02/15  8:05 PM  Result Value Ref Range   Glucose-Capillary 220 (H) 65 - 99 mg/dL  Glucose, capillary     Status: Abnormal   Collection Time: 06/02/15 11:16 PM  Result Value Ref Range   Glucose-Capillary 207 (H) 65 - 99 mg/dL  Protime-INR     Status: Abnormal   Collection Time: 06/03/15  2:17 AM  Result Value Ref Range   Prothrombin Time 31.6 (H) 11.6 - 15.2 seconds   INR 3.13 (H) 0.00 - 1.49  CBC     Status: Abnormal   Collection Time: 06/03/15  2:17 AM  Result Value Ref Range   WBC 13.3 (H) 4.0 - 10.5 K/uL   RBC 2.92 (L) 4.22 - 5.81 MIL/uL   Hemoglobin 7.8 (L) 13.0 - 17.0 g/dL    Comment: REPEATED TO VERIFY   HCT 23.5 (L) 39.0 - 52.0 %   MCV 80.5 78.0 - 100.0 fL   MCH 26.7 26.0 - 34.0 pg   MCHC 33.2 30.0 - 36.0 g/dL   RDW 16.4 (H) 11.5 - 15.5 %   Platelets 162 150 - 400 K/uL  Renal function panel     Status: Abnormal   Collection Time: 06/03/15  2:17 AM  Result Value Ref Range   Sodium 133 (L) 135 - 145 mmol/L   Potassium 3.7 3.5 - 5.1 mmol/L   Chloride 97 (L) 101 - 111 mmol/L   CO2 21 (L) 22 - 32 mmol/L    Glucose, Bld 210 (H) 65 - 99 mg/dL   BUN 93 (H) 6 - 20 mg/dL   Creatinine, Ser 8.96 (H) 0.61 - 1.24 mg/dL   Calcium 7.3 (L) 8.9 - 10.3 mg/dL   Phosphorus 4.2 2.5 - 4.6 mg/dL   Albumin 1.7 (L) 3.5 - 5.0 g/dL   GFR calc non Af Amer 5 (L) >60 mL/min   GFR calc Af Amer 6 (L) >60 mL/min    Comment: (NOTE) The eGFR has been calculated using the CKD EPI equation. This calculation has not been validated in all clinical situations. eGFR's persistently <60 mL/min signify possible Chronic Kidney Disease.    Anion gap 15 5 - 15  Glucose, capillary     Status: Abnormal   Collection Time: 06/03/15  3:21 AM  Result Value Ref Range   Glucose-Capillary 218 (H) 65 - 99 mg/dL  Glucose, capillary     Status: Abnormal   Collection Time: 06/03/15  7:23 AM  Result Value Ref Range   Glucose-Capillary 204 (H) 65 - 99 mg/dL  Glucose, capillary     Status: Abnormal   Collection Time: 06/03/15 11:20 AM  Result Value Ref Range   Glucose-Capillary 263 (H) 65 - 99 mg/dL   Recent Results (from the past 240 hour(s))  Blood culture (routine x 2)     Status: Abnormal (Preliminary result)   Collection Time: 05/31/15 10:55 AM  Result Value Ref Range Status   Specimen Description BLOOD RIGHT HAND  Final   Special Requests BOTTLES DRAWN AEROBIC AND ANAEROBIC 5CC  Final   Culture  Setup Time   Final    GRAM NEGATIVE RODS IN BOTH AEROBIC AND ANAEROBIC BOTTLES CRITICAL RESULT CALLED TO, READ BACK BY AND VERIFIED WITH: Archie Balboa RN [id number] 05/31/15 A BROWNING    Culture KLEBSIELLA PNEUMONIAE (A)  Final   Report Status PENDING  Incomplete   Organism ID, Bacteria KLEBSIELLA PNEUMONIAE  Final      Susceptibility   Klebsiella pneumoniae - MIC*    AMPICILLIN 16 RESISTANT Resistant     CEFAZOLIN <=4 SENSITIVE Sensitive     CEFEPIME <=1 SENSITIVE Sensitive     CEFTAZIDIME <=1 SENSITIVE Sensitive     CEFTRIAXONE <=1 SENSITIVE Sensitive     CIPROFLOXACIN <=0.25 SENSITIVE Sensitive     GENTAMICIN <=1 SENSITIVE Sensitive      IMIPENEM <=0.25 SENSITIVE Sensitive     TRIMETH/SULFA <=20 SENSITIVE Sensitive     AMPICILLIN/SULBACTAM <=2 SENSITIVE Sensitive     PIP/TAZO <=4 SENSITIVE Sensitive     * KLEBSIELLA PNEUMONIAE  Blood culture (routine x 2)     Status: Abnormal (Preliminary result)   Collection Time: 05/31/15 11:35 AM  Result Value Ref Range Status   Specimen Description BLOOD RIGHT HAND  Final   Special Requests BOTTLES DRAWN AEROBIC ONLY 5CC  Final   Culture  Setup Time   Final    GRAM NEGATIVE RODS AEROBIC BOTTLE ONLY CRITICAL RESULT CALLED TO, READ BACK BY AND VERIFIED WITH: Renette Butters '@0026'  06/01/15 MKELLY    Culture (A)  Final    KLEBSIELLA PNEUMONIAE SUSCEPTIBILITIES PERFORMED ON PREVIOUS CULTURE WITHIN THE LAST 5 DAYS.    Report Status PENDING  Incomplete  Urine culture     Status: Abnormal   Collection Time: 05/31/15 11:58 AM  Result Value Ref Range Status   Specimen Description URINE, RANDOM  Final   Special Requests NONE  Final   Culture MULTIPLE SPECIES PRESENT, SUGGEST RECOLLECTION (A)  Final   Report Status 06/01/2015 FINAL  Final  MRSA PCR Screening     Status: None   Collection Time: 05/31/15  3:44 PM  Result Value Ref Range Status   MRSA by PCR NEGATIVE NEGATIVE Final    Comment:        The GeneXpert MRSA Assay (FDA approved for NASAL specimens only), is one component of a comprehensive MRSA colonization surveillance program. It is not intended to diagnose MRSA infection nor to guide or monitor treatment for MRSA infections.   Culture, blood (routine x 2)     Status: Abnormal (Preliminary result)   Collection Time: 05/31/15  4:05 PM  Result Value Ref Range Status   Specimen Description BLOOD RIGHT HAND  Final   Special Requests IN PEDIATRIC BOTTLE 2CC  Final   Culture  Setup Time   Final    GRAM NEGATIVE RODS AEROBIC BOTTLE ONLY CRITICAL RESULT CALLED TO, READ BACK BY AND VERIFIED WITH: M TOLER,RN '@[id number]'  06/01/15 MKELLY  Culture (A)  Final    KLEBSIELLA  PNEUMONIAE SUSCEPTIBILITIES PERFORMED ON PREVIOUS CULTURE WITHIN THE LAST 5 DAYS.    Report Status PENDING  Incomplete  Culture, blood (routine x 2)     Status: Abnormal (Preliminary result)   Collection Time: 05/31/15  4:15 PM  Result Value Ref Range Status   Specimen Description BLOOD RIGHT ANTECUBITAL  Final   Special Requests IN PEDIATRIC BOTTLE 3CC  Final   Culture  Setup Time   Final    GRAM NEGATIVE RODS AEROBIC BOTTLE ONLY CRITICAL RESULT CALLED TO, READ BACK BY AND VERIFIED WITH: Ferrel Logan ,RN @ [phone] 06/01/15 MKELLY    Culture (A)  Final    KLEBSIELLA PNEUMONIAE SUSCEPTIBILITIES PERFORMED ON PREVIOUS CULTURE WITHIN THE LAST 5 DAYS.    Report Status PENDING  Incomplete   Creatinine:  Recent Labs  05/31/15 1055 05/31/15 1553 05/31/15 2251 06/01/15 0218 06/02/15 0217 06/03/15 0217  CREATININE 9.36* 9.07* 9.41* 9.33* 9.67* 8.96*    Xrays: See report/chart Reviewed   Impression/Assessment:  Perinephric hematoma; good prognosis  Plan:  Treat UTI/sepsis; transfuse PRN; only re-xray if clinically unstable and drops Hb etc Will follow  Recommend re-image in about 4 months for re-baseline and to note resolution and to rule out underlying renal pathology (in this case unlikely present)  Aleera Gilcrease A 06/03/2015, 12:32 PM

## 2015-06-03 NOTE — Progress Notes (Signed)
Pharmacy Antibiotic Note  [name] is a 75 y.o. male admitted on 05/31/2015 with sepsis/UTI. Recently seen Friday in ED for catheter blockage/bleeding. Pharmacy has been consulted for ceftaz dosing. Given 1x dose of vanc and Zosyn 30 min infusion also ordered x 1. AF, SCr 9.36 on admit (recent baseline ~1.4-1.5), CrCl ~ 10. Pt was changed to ceftazidime 2g q12h with plans for CVVHD but underwent HD yesterday. Will decrease dose to HD dosing.  Plan: Ceftaz 1g IV q24h after HD Monitor clinical progress, c/s, renal function, abx plan/LOT  Height: 5\' 10"  (177.8 cm) Weight: 288 lb 2.3 oz (130.7 kg) IBW/kg (Calculated) : 73  Temp (24hrs), Avg:98 F (36.7 C), Min:97.6 F (36.4 C), Max:99 F (37.2 C)   Recent Labs Lab 05/31/15 1055 05/31/15 1145 05/31/15 1352 05/31/15 1444 05/31/15 1553 05/31/15 2251 06/01/15 0218 06/02/15 0217 06/03/15 0217  WBC 10.3  --   --   --   --   --  9.4 12.0* 13.3*  CREATININE 9.36*  --   --   --  9.07* 9.41* 9.33* 9.67* 8.96*  LATICACIDVEN  --  3.19* 2.6* 1.68  --   --   --   --   --     Estimated Creatinine Clearance: 10 mL/min (by C-G formula based on Cr of 8.96).    No Known Allergies  Antimicrobials this admission: 4/25 vanc x1 4/25 zosyn x1 4/25 ceftaz >>  Dose adjustments this admission: ceftaz 2 g q48h > ceftaz 2 g q12h Ceftaz 2g q12h > ceftaz 1g q24h  Microbiology results: 4/25 BCx: gram neg rods 4/25 MRSA PCR: negative   Andrey Cota. Diona Foley, PharmD, [place] Clinical Pharmacist Pager [phone]  06/03/2015 7:35 AM

## 2015-06-04 LAB — GLUCOSE, CAPILLARY
GLUCOSE-CAPILLARY: 122 mg/dL — AB (ref 65–99)
GLUCOSE-CAPILLARY: 128 mg/dL — AB (ref 65–99)
GLUCOSE-CAPILLARY: 140 mg/dL — AB (ref 65–99)
GLUCOSE-CAPILLARY: 152 mg/dL — AB (ref 65–99)
GLUCOSE-CAPILLARY: 155 mg/dL — AB (ref 65–99)
GLUCOSE-CAPILLARY: 161 mg/dL — AB (ref 65–99)
GLUCOSE-CAPILLARY: 193 mg/dL — AB (ref 65–99)
GLUCOSE-CAPILLARY: 291 mg/dL — AB (ref 65–99)
Glucose-Capillary: 129 mg/dL — ABNORMAL HIGH (ref 65–99)
Glucose-Capillary: 139 mg/dL — ABNORMAL HIGH (ref 65–99)
Glucose-Capillary: 257 mg/dL — ABNORMAL HIGH (ref 65–99)

## 2015-06-04 LAB — CULTURE, BLOOD (ROUTINE X 2)

## 2015-06-04 LAB — RENAL FUNCTION PANEL
Albumin: 1.9 g/dL — ABNORMAL LOW (ref 3.5–5.0)
Anion gap: 14 (ref 5–15)
BUN: 103 mg/dL — AB (ref 6–20)
CHLORIDE: 100 mmol/L — AB (ref 101–111)
CO2: 21 mmol/L — AB (ref 22–32)
CREATININE: 9.57 mg/dL — AB (ref 0.61–1.24)
Calcium: 7.5 mg/dL — ABNORMAL LOW (ref 8.9–10.3)
GFR calc Af Amer: 5 mL/min — ABNORMAL LOW (ref >60)
GFR calc non Af Amer: 5 mL/min — ABNORMAL LOW (ref >60)
GLUCOSE: 152 mg/dL — AB (ref 65–99)
POTASSIUM: 4 mmol/L (ref 3.5–5.1)
Phosphorus: 4.5 mg/dL (ref 2.5–4.6)
Sodium: 135 mmol/L (ref 135–145)

## 2015-06-04 LAB — CBC
HEMATOCRIT: 21.8 % — AB (ref 39.0–52.0)
Hemoglobin: 7.3 g/dL — ABNORMAL LOW (ref 13.0–17.0)
MCH: 27.4 pg (ref 26.0–34.0)
MCHC: 33.5 g/dL (ref 30.0–36.0)
MCV: 82 fL (ref 78.0–100.0)
Platelets: 193 10*3/uL (ref 150–400)
RBC: 2.66 MIL/uL — ABNORMAL LOW (ref 4.22–5.81)
RDW: 16.5 % — AB (ref 11.5–15.5)
WBC: 18.3 10*3/uL — AB (ref 4.0–10.5)

## 2015-06-04 LAB — PROTIME-INR
INR: 2.53 — ABNORMAL HIGH (ref 0.00–1.49)
Prothrombin Time: 26.9 seconds — ABNORMAL HIGH (ref 11.6–15.2)

## 2015-06-04 LAB — PREPARE FRESH FROZEN PLASMA
UNIT DIVISION: 0
Unit division: 0

## 2015-06-04 MED ORDER — INSULIN ASPART 100 UNIT/ML ~~LOC~~ SOLN
0.0000 [IU] | SUBCUTANEOUS | Status: DC
  Administered 2015-06-04 (×2): 5 [IU] via SUBCUTANEOUS
  Administered 2015-06-04: 2 [IU] via SUBCUTANEOUS
  Administered 2015-06-05: 5 [IU] via SUBCUTANEOUS
  Administered 2015-06-05 (×2): 3 [IU] via SUBCUTANEOUS

## 2015-06-04 MED ORDER — INSULIN GLARGINE 100 UNIT/ML ~~LOC~~ SOLN
20.0000 [IU] | SUBCUTANEOUS | Status: DC
  Administered 2015-06-04 – 2015-06-05 (×2): 20 [IU] via SUBCUTANEOUS
  Filled 2015-06-04 (×2): qty 0.2

## 2015-06-04 NOTE — Progress Notes (Signed)
PULMONARY / CRITICAL CARE MEDICINE   Name: [name] MRN: [id number] DOB: 07-01-1940    ADMISSION DATE:  05/31/2015 CONSULTATION DATE:  05/31/15  REFERRING MD:  EDP  CHIEF COMPLAINT:  Hypotension  HISTORY OF PRESENT ILLNESS:  [name] is a 75 y.o. male with PMH as outlined below including prostate CA s/p radiation 2011 and RCC s/p left nephrectomy.  He was seen in ED 4/22 for clogged catheter which was irrigated and replaced.  He apparently has had hematuria for quite some time which has been attributed to scarring from prior radiation.  He had been in his USOH until roughly 4/20 when he had decreased appetite.  He had not been eating or drinking for the past 5 days. On 04/25 he went to have his INR checked (on coumadin for A.fib and hx DVT / PE) and was found to have INR of 8 and was hypotensive with SBP 70/44.  He was subsequently sent to ED for further evaluation.  In ED, he was found to have UTI and despite 4.5L IVF, he remained hypotensive.  He also had multiple metabolic derangements including Na 124, K 5.0, AGMA, lactate 3, SCr 9.36. PCCM was therefore consulted for admission of septic shock due to urosepsis as well as AKI.   No current facility-administered medications on file prior to encounter.   Current Outpatient Prescriptions on File Prior to Encounter  Medication Sig  . carvedilol (COREG) 3.125 MG tablet take 1 tablet by mouth twice a day with meals  . fenofibrate (TRICOR) 145 MG tablet Take 145 mg by mouth daily.   . ferrous sulfate 325 (65 FE) MG EC tablet take 1 tablet by mouth once daily  . furosemide (LASIX) 40 MG tablet take 1 tablet by mouth once daily  . HYDROcodone-acetaminophen (NORCO/VICODIN) 5-325 MG tablet Take 1-2 tablets by mouth every 4 (four) hours as needed (breakthrough pain).  . Niacin CR 1000 MG TBCR Take 1,000 mg by mouth at bedtime.   . polyvinyl alcohol (LIQUIFILM TEARS) 1.4 % ophthalmic solution Place 1 drop into both eyes 2 (two) times  daily as needed for dry eyes.  . potassium chloride (K-DUR,KLOR-CON) 10 MEQ tablet take 1 tablet by mouth once daily  . quinapril (ACCUPRIL) 20 MG tablet Take 20 mg by mouth daily.   Marland Kitchen warfarin (COUMADIN) 1 MG tablet Take 1 mg by mouth every Monday, Wednesday, and Friday.  . warfarin (COUMADIN) 5 MG tablet Take 5-6 mg by mouth daily. 6 mg on Monday, Wednesday, and Friday.  Take 5 mg on Tuesday, Thursday, Saturday, and Sunday.    SUBJECTIVE:  No acute events overnight. No complaints this AM. Feels well.  VITAL SIGNS: BP 146/76 mmHg  Pulse 85  Temp(Src) 99.3 F (37.4 C) (Oral)  Resp 29  Ht 5\' 10"  (1.778 m)  Wt 288 lb 12.8 oz (131 kg)  BMI 41.44 kg/m2  SpO2 96%  HEMODYNAMICS:    VENTILATOR SETTINGS: Vent Mode:  [-]  FiO2 (%):  [21 %] 21 %N/A  INTAKE / OUTPUT: I/O last 3 completed shifts: In: 1056.3 [P.O.:120; I.V.:317.1; Blood:619.2] Out: 345 [Urine:345]   PHYSICAL EXAMINATION: General: Adult man in bed in NAD  Neuro: Awake, oriented. No distress HEENT: Moist mucus membranes.  Cardiovascular: RRR, no MRG Lungs: Clear, no wheeze or crackle Abdomen: Obese, + BS Musculoskeletal: +1 B/L LE edema  Skin: Intact, warm, no rashes.  LABS:  BMET  Recent Labs Lab 06/02/15 0217 06/03/15 0217 06/04/15 0435  NA 131* 133* 135  K 3.6 3.7 4.0  CL 97* 97* 100*  CO2 14* 21* 21*  BUN 111* 93* 103*  CREATININE 9.67* 8.96* 9.57*  GLUCOSE 264* 210* 152*    Electrolytes  Recent Labs Lab 06/01/15 0218 06/02/15 0217 06/03/15 0217 06/04/15 0435  CALCIUM 7.2* 7.2* 7.3* 7.5*  MG 2.1  --   --   --   PHOS 5.1*  --  4.2 4.5    CBC  Recent Labs Lab 06/02/15 0217 06/03/15 0217 06/04/15 0435  WBC 12.0* 13.3* 18.3*  HGB 10.3* 7.8* 7.3*  HCT 30.2* 23.5* 21.8*  PLT 132* 162 193    Coag's  Recent Labs Lab 06/01/15 1043 06/02/15 0217 06/03/15 0217 06/04/15 0435  APTT 31  --   --   --   INR 2.53*  2.79* 2.12* 3.13* 2.53*    Sepsis Markers  Recent Labs Lab  05/31/15 1145 05/31/15 1352 05/31/15 1444  LATICACIDVEN 3.19* 2.6* 1.68    ABG No results for input(s): PHART, PCO2ART, PO2ART in the last 168 hours.  Liver Enzymes  Recent Labs Lab 06/01/15 1043 06/03/15 0217 06/04/15 0435  BILITOT 1.5*  --   --   ALBUMIN  --  1.7* 1.9*    Cardiac Enzymes No results for input(s): TROPONINI, PROBNP in the last 168 hours.  Glucose  Recent Labs Lab 06/04/15 0208 06/04/15 0309 06/04/15 0409 06/04/15 0459 06/04/15 0600 06/04/15 0724  GLUCAP 128* 129* 139* 152* 161* 155*    Imaging No results found.  STUDIES:  CXR 04/25 > Low volumes. LLL opacity may reflect atelectasis or infiltrate. Renal US 04/25 > surgically absent left kidney, no hydronephrosis, small right renal cyst.  CULTURES: Blood 04/25 > klebsiella  Urine 04/25 >  Multiple species present MRSA nares > negative Blood 04/27 > NGTD  ANTIBIOTICS: Ceftaz 04/25 > Vanc 04/25 > 04/25  SIGNIFICANT EVENTS: 04/25 > admitted with septic shock due to UTI.  Significant ARF.  LINES/TUBES: PIV  DISCUSSION: 75 y.o. M with hx RCC and prostate CA s/p radiation 2011 and left nephrectomy with increased INR, ARF, perinephric hematoma.  ASSESSMENT / PLAN:  CARDIOVASCULAR A:  Shock - primarily 2/2 UTI.  Better with fluids.  Not on pressors. Hx a.fib (on coumadin), HLD HTN - normotensive P:  Continue to hold outpatient carvedilol, fenofibrate, furosemide, niacin, quinapril, coumadin.  INFECTIOUS A:   Septic shock - likely urine source.  Has hx of E.coli and pseudomonas UTI in the past.  Blood cx - klebsiella  P:   Stopped ceftazidime, started ceftriaxone for Klebsiella Follow-up cx.  PULMONARY A: Acute hypoxic respiratory failure - resolved Atelectasis. 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 Hold coumadin given elevated INR Heparin gtt once INR < 2 and if no evidence of bleeding  RENAL A:    ARF AGMA - due to lactate, uremia; improving Hyponatremia - slowly improving Mild hyperkalemia - resolved Chronic foley w/hematuria at admission - improving, likely 2/2 infx Perinephric hematoma - etiology could be benign cause, idiopathic or recurrence of malignancy given his RCC hx; hgb down 10.3 to 7.8 in 24 hours P:   Daily RFP Replace electrolytes prn Nephrology consulted given anuria, appreciate recs; continue intermittent HD Urology consulted given hx of RCC w/ current perinephric hematoma concerning for possible RCC recurrence; appreciate recommendations.  GASTROINTESTINAL A:   Obesity. Nutrition - didn't tolerate renal diet, back to clears overnight P:   Clear liquid, ADAT PPI for SUP  HEMATOLOGIC / ONCOLOGIC A:   Supratherapeutic INR - reportedly 8 at outpatient check 04/25; had been downtrending now back up to 3.13 today w/ drop in hgb. 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 No evidence of hemolysis or DIC; abnormalities likely due to severe infx Anemia. P:  Monitor INR, hgb, plt SCD's for now  ENDOCRINE A:   DM - diet controlled.  CBGs 207 - 346 in past 24 hours. P:   On insulin drip. Transitioned to lantus CBGs ac/hs  NEUROLOGIC A:   No acute issues. P:   No interventions required.  Family updated: wife updated at bedside 4/29 Interdisciplinary Family Meeting v Palliative Care Meeting:  Due by: 06/05/15.  Marshell Garfinkel MD [facility] Pager [phone] If no answer or after 3pm call: [phone] 06/04/2015, 10:38 AM

## 2015-06-04 NOTE — Progress Notes (Signed)
Subjective:  Patient reports that he feels well this am.  No concerns.  Objective Vital signs in last 24 hours: Filed Vitals:   06/04/15 0500 06/04/15 0600 06/04/15 0700 06/04/15 0727  BP: 117/70 94/64 133/54   Pulse: 95 90 82   Temp:    99.3 F (37.4 C)  TempSrc:    Oral  Resp: 24 22 21    Height:      Weight:      SpO2: 97% 97% 97%    Weight change: 10.6 oz (0.3 kg)  Intake/Output Summary (Last 24 hours) at 06/04/15 0747 Last data filed at 06/04/15 0700  Gross per 24 hour  Intake 936.31 ml  Output    295 ml  Net 641.31 ml   Labs: Basic Metabolic Panel:  Recent Labs Lab 06/01/15 0218 06/02/15 0217 06/03/15 0217 06/04/15 0435  NA 129* 131* 133* 135  K 4.3 3.6 3.7 4.0  CL 100* 97* 97* 100*  CO2 13* 14* 21* 21*  GLUCOSE 194* 264* 210* 152*  BUN 122* 111* 93* 103*  CREATININE 9.33* 9.67* 8.96* 9.57*  CALCIUM 7.2* 7.2* 7.3* 7.5*  PHOS 5.1*  --  4.2 4.5   Liver Function Tests:  Recent Labs Lab 06/01/15 1043 06/03/15 0217 06/04/15 0435  BILITOT 1.5*  --   --   ALBUMIN  --  1.7* 1.9*   No results for input(s): LIPASE, AMYLASE in the last 168 hours. No results for input(s): AMMONIA in the last 168 hours. CBC:  Recent Labs Lab 05/31/15 1055 06/01/15 0218  06/02/15 0217 06/03/15 0217 06/04/15 0435  WBC 10.3 9.4  --  12.0* 13.3* 18.3*  NEUTROABS 9.1*  --   --  10.2*  --   --   HGB 12.3* 10.3*  --  10.3* 7.8* 7.3*  HCT 37.0* 31.1*  --  30.2* 23.5* 21.8*  MCV 81.3 80.6  --  80.3 80.5 82.0  PLT 131* 124*  < > 132* 162 193  < > = values in this interval not displayed. Cardiac Enzymes: No results for input(s): CKTOTAL, CKMB, CKMBINDEX, TROPONINI in the last 168 hours. CBG:  Recent Labs Lab 06/04/15 0208 06/04/15 0309 06/04/15 0409 06/04/15 0459 06/04/15 0600  GLUCAP 128* 129* 139* 152* 161*    Iron Studies: No results for input(s): IRON, TIBC, TRANSFERRIN, FERRITIN in the last 72 hours. Studies/Results: Ct Abdomen Pelvis Wo Contrast  06/02/2015   CLINICAL DATA:  Mid abdomen pain. Acute renal failure, evaluate kidneys. EXAM: CT ABDOMEN AND PELVIS WITHOUT CONTRAST TECHNIQUE: Multidetector CT imaging of the abdomen and pelvis was performed following the standard protocol without IV contrast. COMPARISON:  March 15, 2014 FINDINGS: Lower chest: There are small bilateral pleural effusions. Mild atelectasis of the posterior lung bases are noted. The heart size is normal. Hepatobiliary: The liver is normal appearing on this noncontrast exam. No focal masses identified. Patient status post prior cholecystectomy. Pancreas: No mass or inflammatory process identified on this un-enhanced exam. Spleen: Within normal limits in size. Adrenals/Urinary Tract: The patient is status post left nephrectomy. There is a large right perinephric hematoma displacing the kidney superiorly and medially. There is no right hydronephrosis. Evaluation of right kidney is limited without intravenous contrast. The adrenal glands are normal. The bladder is decompressed with a Foley catheter in place. Stomach/Bowel: No evidence of obstruction, inflammatory process, or abnormal fluid collections. There is a hiatal hernia. There is diverticulosis of colon. Vascular/Lymphatic: No pathologically enlarged lymph nodes. No evidence of abdominal aortic aneurysm. There is atherosclerosis of  the aorta. Reproductive: No mass or other significant abnormality. Other: There is ascites in the abdomen and pelvis. Musculoskeletal: Degenerative joint changes of the spine are identified. Stable focal sclerosis is identified in the right ilium unchanged. IMPRESSION: Large right perinephric hematoma displacing the right kidney superiorly and medially. There is no right hydronephrosis. Prior left nephrectomy. Small amount of ascites in the abdomen and pelvis. Bilateral pleural effusions. Electronically Signed   By: Abelardo Diesel M.D.   On: 06/02/2015 13:37   Medications: Infusions: . insulin (NOVOLIN-R) infusion  Stopped (06/04/15 0700)    Scheduled Medications: . cefTRIAXone (ROCEPHIN)  IV  2 g Intravenous Q24H  . insulin glargine  20 Units Subcutaneous Q24H  . pantoprazole  40 mg Oral Daily    have reviewed scheduled and prn medications.  Physical Exam: General -- oriented x3, pleasant and cooperative.   Chest -- Normal WOB on room air,  Lungs clear to auscultation. Cardiac -- RRR. No murmurs.  Abdomen -- soft, Bowel sounds present. Extremeties - trace to +1 LE edema bilaterally to anterior shins. +2DP.   Assessment/ Plan: Pt is a 75 y.o. 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 Patient was admitted to the ICU for septic shock 2/2 suspected UTI. Patient's Cr was notably elevated at 9.33, when his baseline was thought to be around 1.7-1.9. PT/INR was elevated. Blood cultures were positive for Gram Neg Rods.   1. AKI/CKD, patient s/p left nephrectomy: Unknown etiology at this time. DDx includes ischemic ATN vs obstructive. No longer thinking HUS. Not on ACEi or NSAIDs. Positive blood culture for G-neg rods -- only one tube, no speciation at this time, recollection NGTD x2d.  - Cr 9.57, BUN 103, Bicarb 21, Hgb 7.3 -  will undergo HD again today. No heparin w/ HD - Peripheral smear w/o schistocytes  - Haptoglobin is high at 335 (no hemolysis)  - DIC panel: not conclusive; can r/o DIC, and high haptoglobin makes HUS unlikely  - Fibrinogen >800 (metastatic dz?) 2. Metabolic acidosis, anion gap: gap 14, likely 2/2 uremia. HD has helped this. 3. Hematologic: Acute blood loss anemia? -- perinephric hematoma present on CT - Hgb 7.3, INR 2.53 - transfuse as needed per primary.  4. Urosepsis, w/ shock: [place]. Management and pressors per CCM.  - currently not on pressors.  5. Retroperitoneal,  perinephric hematoma with displacement of right kidney: No evidence of hydro.  No heparin with HD. Urology following, recommends reimaging in about 4 mths.  Per CCM, patient to SDU today.   Ashly M. Lajuana Ripple, DO PGY-2, [facility]  06/04/2015 7:47 AM   LOS: 4 days   I have seen and examined this patient and agree with plan as outlined by Dr. Lajuana Ripple.  Plan for HD without heparin today and follow.  No significant renal recovery to date. Governor Rooks Olly Shiner,MD 06/04/2015 3:35 PM

## 2015-06-05 DIAGNOSIS — D689 Coagulation defect, unspecified: Secondary | ICD-10-CM

## 2015-06-05 DIAGNOSIS — N10 Acute tubulo-interstitial nephritis: Secondary | ICD-10-CM

## 2015-06-05 DIAGNOSIS — I481 Persistent atrial fibrillation: Secondary | ICD-10-CM

## 2015-06-05 LAB — GLUCOSE, CAPILLARY
GLUCOSE-CAPILLARY: 289 mg/dL — AB (ref 65–99)
GLUCOSE-CAPILLARY: 247 mg/dL — AB (ref 65–99)
Glucose-Capillary: 220 mg/dL — ABNORMAL HIGH (ref 65–99)
Glucose-Capillary: 253 mg/dL — ABNORMAL HIGH (ref 65–99)
Glucose-Capillary: 306 mg/dL — ABNORMAL HIGH (ref 65–99)

## 2015-06-05 LAB — RENAL FUNCTION PANEL
ANION GAP: 11 (ref 5–15)
Albumin: 1.7 g/dL — ABNORMAL LOW (ref 3.5–5.0)
BUN: 66 mg/dL — ABNORMAL HIGH (ref 6–20)
CALCIUM: 7.6 mg/dL — AB (ref 8.9–10.3)
CO2: 22 mmol/L (ref 22–32)
Chloride: 100 mmol/L — ABNORMAL LOW (ref 101–111)
Creatinine, Ser: 6.72 mg/dL — ABNORMAL HIGH (ref 0.61–1.24)
GFR calc non Af Amer: 7 mL/min — ABNORMAL LOW (ref >60)
GFR, EST AFRICAN AMERICAN: 8 mL/min — AB (ref >60)
GLUCOSE: 256 mg/dL — AB (ref 65–99)
POTASSIUM: 3.7 mmol/L (ref 3.5–5.1)
Phosphorus: 4.8 mg/dL — ABNORMAL HIGH (ref 2.5–4.6)
SODIUM: 133 mmol/L — AB (ref 135–145)

## 2015-06-05 LAB — CBC
HCT: 22.9 % — ABNORMAL LOW (ref 39.0–52.0)
HEMATOCRIT: 19.7 % — AB (ref 39.0–52.0)
HEMOGLOBIN: 6.7 g/dL — AB (ref 13.0–17.0)
HEMOGLOBIN: 7.7 g/dL — AB (ref 13.0–17.0)
MCH: 28 pg (ref 26.0–34.0)
MCH: 27.7 pg (ref 26.0–34.0)
MCHC: 34 g/dL (ref 30.0–36.0)
MCHC: 33.6 g/dL (ref 30.0–36.0)
MCV: 82.4 fL (ref 78.0–100.0)
MCV: 82.4 fL (ref 78.0–100.0)
Platelets: 189 10*3/uL (ref 150–400)
Platelets: 192 10*3/uL (ref 150–400)
RBC: 2.39 MIL/uL — AB (ref 4.22–5.81)
RBC: 2.78 MIL/uL — AB (ref 4.22–5.81)
RDW: 16.6 % — ABNORMAL HIGH (ref 11.5–15.5)
RDW: 16.3 % — AB (ref 11.5–15.5)
WBC: 15 10*3/uL — ABNORMAL HIGH (ref 4.0–10.5)
WBC: 17.1 10*3/uL — ABNORMAL HIGH (ref 4.0–10.5)

## 2015-06-05 LAB — APTT: aPTT: 36 seconds (ref 24–37)

## 2015-06-05 LAB — PROTIME-INR
INR: 2.81 — ABNORMAL HIGH (ref 0.00–1.49)
INR: 2.34 — ABNORMAL HIGH (ref 0.00–1.49)
Prothrombin Time: 29.1 seconds — ABNORMAL HIGH (ref 11.6–15.2)
Prothrombin Time: 25.4 seconds — ABNORMAL HIGH (ref 11.6–15.2)

## 2015-06-05 LAB — OCCULT BLOOD X 1 CARD TO LAB, STOOL: Fecal Occult Bld: NEGATIVE

## 2015-06-05 LAB — PREPARE RBC (CROSSMATCH)

## 2015-06-05 MED ORDER — SODIUM CHLORIDE 0.9 % IV SOLN
20.0000 ug | Freq: Once | INTRAVENOUS | Status: AC
  Administered 2015-06-05: 20 ug via INTRAVENOUS
  Filled 2015-06-05: qty 5

## 2015-06-05 MED ORDER — VITAMIN K1 10 MG/ML IJ SOLN
5.0000 mg | Freq: Once | INTRAMUSCULAR | Status: DC
  Filled 2015-06-05: qty 0.5

## 2015-06-05 MED ORDER — INSULIN ASPART 100 UNIT/ML ~~LOC~~ SOLN
3.0000 [IU] | Freq: Three times a day (TID) | SUBCUTANEOUS | Status: DC
  Administered 2015-06-06: 3 [IU] via SUBCUTANEOUS

## 2015-06-05 MED ORDER — DILTIAZEM HCL 30 MG PO TABS
30.0000 mg | ORAL_TABLET | Freq: Three times a day (TID) | ORAL | Status: DC
  Administered 2015-06-05 – 2015-06-06 (×3): 30 mg via ORAL
  Filled 2015-06-05 (×4): qty 1

## 2015-06-05 MED ORDER — INSULIN ASPART 100 UNIT/ML ~~LOC~~ SOLN
0.0000 [IU] | Freq: Every day | SUBCUTANEOUS | Status: DC
  Administered 2015-06-05: 2 [IU] via SUBCUTANEOUS
  Administered 2015-06-07: 3 [IU] via SUBCUTANEOUS

## 2015-06-05 MED ORDER — INSULIN ASPART 100 UNIT/ML ~~LOC~~ SOLN: 0.0000 [IU] | Freq: Every day | SUBCUTANEOUS | Status: DC

## 2015-06-05 MED ORDER — INSULIN ASPART 100 UNIT/ML ~~LOC~~ SOLN
0.0000 [IU] | Freq: Three times a day (TID) | SUBCUTANEOUS | Status: DC
  Administered 2015-06-06: 11 [IU] via SUBCUTANEOUS
  Administered 2015-06-06: 3 [IU] via SUBCUTANEOUS
  Administered 2015-06-06: 7 [IU] via SUBCUTANEOUS
  Administered 2015-06-07: 4 [IU] via SUBCUTANEOUS
  Administered 2015-06-07: 11 [IU] via SUBCUTANEOUS

## 2015-06-05 MED ORDER — PHYTONADIONE 5 MG PO TABS: 5.0000 mg | ORAL_TABLET | Freq: Once | ORAL | Status: DC

## 2015-06-05 MED ORDER — CARVEDILOL 3.125 MG PO TABS
3.1250 mg | ORAL_TABLET | Freq: Two times a day (BID) | ORAL | Status: DC
  Administered 2015-06-05: 3.125 mg via ORAL
  Filled 2015-06-05: qty 1

## 2015-06-05 MED ORDER — HYDRALAZINE HCL 20 MG/ML IJ SOLN: 10.0000 mg | Freq: Four times a day (QID) | INTRAMUSCULAR | Status: DC | PRN

## 2015-06-05 MED ORDER — CARVEDILOL 6.25 MG PO TABS
6.2500 mg | ORAL_TABLET | Freq: Two times a day (BID) | ORAL | Status: DC
  Administered 2015-06-06 (×2): 6.25 mg via ORAL
  Filled 2015-06-05 (×7): qty 1

## 2015-06-05 MED ORDER — INSULIN ASPART 100 UNIT/ML ~~LOC~~ SOLN
0.0000 [IU] | Freq: Three times a day (TID) | SUBCUTANEOUS | Status: DC
  Administered 2015-06-05: 8 [IU] via SUBCUTANEOUS
  Administered 2015-06-05: 11 [IU] via SUBCUTANEOUS

## 2015-06-05 MED ORDER — SODIUM CHLORIDE 0.9 % IV SOLN: Freq: Once | INTRAVENOUS | Status: DC

## 2015-06-05 MED ORDER — PROTHROMBIN COMPLEX CONC HUMAN 500 UNITS IV KIT: 2500.0000 [IU] | PACK | Status: DC

## 2015-06-05 MED ORDER — VITAMIN K1 10 MG/ML IJ SOLN
10.0000 mg | Freq: Once | INTRAVENOUS | Status: AC
  Administered 2015-06-05: 10 mg via INTRAVENOUS
  Filled 2015-06-05: qty 1

## 2015-06-05 NOTE — Progress Notes (Signed)
[facility] TEAM 1 - Stepdown/ICU TEAM PROGRESS NOTE  [name] [name] DOB: Mar 17, 1940 DOA: 05/31/2015 PCP: Melinda Crutch  Admit HPI / Brief Narrative: 75 y.o. male with hx of prostate CA s/p radiation 2011 and RCC s/p left nephrectomy who was seen in ED 4/22 for clogged urinary catheter which was irrigated and replaced. He reportedly has had hematuria for quite some time which has been attributed to scarring from prior radiation.  He had been in his USOH until roughly 4/20 when he had decreased appetite. He had not been eating or drinking for 5 days. On 04/25 he went to have his INR checked (on coumadin for A.fib and hx DVT / PE) and was found to have INR of 8 and was hypotensive with SBP 70/44. He was sent to the ED for further evaluation.  In the ED, he was found to have UTI and despite 4.5L IVF, he remained hypotensive. He also had multiple metabolic derangements including Na 124, K 5.0, AGMA, lactate 3, SCr 9.36. PCCM was consulted for admission w/ septic shock due to urosepsis as well as AKI.  Significant Events: 4/25 admit with septic shock due to UTI + ARF 4/30 [facility] assumed care   HPI/Subjective: The pt is resting comfortably.  He denies cp, sob, n/v, flank or abdom pain.  He is alert and conversant.   Assessment/Plan:  Septic Shock and bacteremia due to Klebsiella Pyelonephritis  Continue directed abx tx - shock and sepsis physiology resolved - will needed extended course of abx given bacteremia   Acute Renal Failure (hx L nephrectomy 1999) Nephrology following - UOP still scant, but appears to be improving somewhat - requiring HD - etiology unclear   Recent Labs Lab 05/31/15 2251 06/01/15 0218 06/02/15 0217 06/03/15 0217 06/04/15 0435 06/05/15 0313  CREATININE 9.41* 9.33* 9.67* 8.96* 9.57* 6.72*    Perinephric hematoma Urology has seen and suggests no acute intervention is required presently - falling Hgb worrisome for ongoing leak - follow clinically for  now - transition anticoag to heparin for ease of reversibility   Acute blood loss anemia  Hgb has been on a steady decline since 4/25 - somewhat explained by dilution (+~8L since admit) but also worrisome for ongoing blood loss - pt is hemodynamically stable - transfuse 1U today and follow - reverse coumadin as INR climbing (cover w/ heparin as pt high risk w/ hx afib + PE)   Recent Labs Lab 05/31/15 1055 06/01/15 0218 06/02/15 0217 06/03/15 0217 06/04/15 0435 06/05/15 0500  HGB 12.3* 10.3* 10.3* 7.8* 7.3* 6.7*    Coagulopathy Pt is s/p 6U total FFP since admit, and has still never been fully corrected - dose w/ vitamin K today + Kcentra - given high risk of clotting event (Afib + hx PE) will still plan to initiate heparin IV once INR fully corrected - given marked uremia, will also dose w/ ddavp to improve Plt clotting function   Recent Labs Lab 06/01/15 0218 06/01/15 1043 06/02/15 0217 06/03/15 0217 06/04/15 0435 06/05/15 0500  INR 7.47* 2.53*  2.79* 2.12* 3.13* 2.53* 2.81*    Chronic foley w/ hematuria  Very minimal urinary output presently, but no hematuria   Chronic A.fib Rate controlled   HLD  Hx of HTN   Acute hypoxic respiratory failure Resolved  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  Hx prostate CA s/p radiation 2011  RCC s/p left nephrectomy  DM - diet controlled CBG poorly controlled - adjust tx and follow  Code Status: FULL Family Communication: spoke w/ wife at bedside  Disposition Plan: SDU   Consultants: PCCM Nephrology Urology   Antibiotics: Ceftaz 04/25 > Vanc 04/25 > 04/25  DVT prophylaxis: Warfarin > heparin  Objective: Blood pressure 128/87, pulse 76, temperature 97.5 F (36.4 C), temperature source Axillary, resp. rate 19, height 5\' 10"  (1.778  m), weight 130.3 kg (287 lb 4.2 oz), SpO2 99 %.  Intake/Output Summary (Last 24 hours) at 06/05/15 0835 Last data filed at 06/05/15 0800  Gross per 24 hour  Intake    275 ml  Output   1200 ml  Net   -925 ml   Exam: General: No acute respiratory distress - alert and conversant  Lungs: Clear to auscultation bilaterally without wheezes or crackles Cardiovascular: Regular rate and irregular rhythm without murmur gallop or rub  Abdomen: Nontender, obese, soft, bowel sounds positive, no rebound, no ascites, no appreciable mass Extremities: No significant cyanosis, or clubbing;  1+ edema bilateral lower extremities  Data Reviewed: Basic Metabolic Panel:  Recent Labs Lab 06/01/15 0218 06/02/15 0217 06/03/15 0217 06/04/15 0435 06/05/15 0313  NA 129* 131* 133* 135 133*  K 4.3 3.6 3.7 4.0 3.7  CL 100* 97* 97* 100* 100*  CO2 13* 14* 21* 21* 22  GLUCOSE 194* 264* 210* 152* 256*  BUN 122* 111* 93* 103* 66*  CREATININE 9.33* 9.67* 8.96* 9.57* 6.72*  CALCIUM 7.2* 7.2* 7.3* 7.5* 7.6*  MG 2.1  --   --   --   --   PHOS 5.1*  --  4.2 4.5 4.8*    CBC:  Recent Labs Lab 05/31/15 1055 06/01/15 0218 06/01/15 1043 06/02/15 0217 06/03/15 0217 06/04/15 0435 06/05/15 0500  WBC 10.3 9.4  --  12.0* 13.3* 18.3* 15.0*  NEUTROABS 9.1*  --   --  10.2*  --   --   --   HGB 12.3* 10.3*  --  10.3* 7.8* 7.3* 6.7*  HCT 37.0* 31.1*  --  30.2* 23.5* 21.8* 19.7*  MCV 81.3 80.6  --  80.3 80.5 82.0 82.4  PLT 131* 124* 122* 132* 162 193 189    Liver Function Tests:  Recent Labs Lab 06/01/15 1043 06/03/15 0217 06/04/15 0435 06/05/15 0313  BILITOT 1.5*  --   --   --   ALBUMIN  --  1.7* 1.9* 1.7*    Coags:  Recent Labs Lab 06/01/15 1043 06/02/15 0217 06/03/15 0217 06/04/15 0435 06/05/15 0500  INR 2.53*  2.79* 2.12* 3.13* 2.53* 2.81*    Recent Labs Lab 06/01/15 1043 06/05/15 0500  APTT 31 36   CBG:  Recent Labs Lab 06/04/15 0724 06/04/15 1127 06/04/15 1557 06/04/15 2104  06/05/15 0032  GLUCAP 155* 257* 291* 193* 253*    Recent Results (from the past 240 hour(s))  Blood culture (routine x 2)     Status: Abnormal   Collection Time: 05/31/15 10:55 AM  Result Value Ref Range Status   Specimen Description BLOOD RIGHT HAND  Final   Special Requests BOTTLES DRAWN AEROBIC AND ANAEROBIC 5CC  Final   Culture  Setup Time   Final    GRAM NEGATIVE RODS IN BOTH AEROBIC AND ANAEROBIC BOTTLES CRITICAL RESULT CALLED TO, READ BACK BY AND VERIFIED WITH: Archie Balboa RN [id number] 05/31/15 A BROWNING    Culture KLEBSIELLA PNEUMONIAE (A)  Final   Report Status 06/04/2015 FINAL  Final   Organism ID, Bacteria KLEBSIELLA PNEUMONIAE  Final      Susceptibility   Klebsiella pneumoniae - MIC*    AMPICILLIN 16 RESISTANT Resistant     CEFAZOLIN <=4 SENSITIVE Sensitive     CEFEPIME <=1 SENSITIVE Sensitive     CEFTAZIDIME <=1 SENSITIVE Sensitive     CEFTRIAXONE <=1 SENSITIVE Sensitive     CIPROFLOXACIN <=0.25 SENSITIVE Sensitive     GENTAMICIN <=1 SENSITIVE Sensitive     IMIPENEM <=0.25 SENSITIVE Sensitive     TRIMETH/SULFA <=20 SENSITIVE Sensitive     AMPICILLIN/SULBACTAM <=2 SENSITIVE Sensitive     PIP/TAZO <=4 SENSITIVE Sensitive     * KLEBSIELLA PNEUMONIAE  Blood culture (routine x 2)     Status: Abnormal   Collection Time: 05/31/15 11:35 AM  Result Value Ref Range Status   Specimen Description BLOOD RIGHT HAND  Final   Special Requests BOTTLES DRAWN AEROBIC ONLY 5CC  Final   Culture  Setup Time   Final    GRAM NEGATIVE RODS AEROBIC BOTTLE ONLY CRITICAL RESULT CALLED TO, READ BACK BY AND VERIFIED WITH: M TOLER,RN @0026  06/01/15 MKELLY    Culture (A)  Final    KLEBSIELLA PNEUMONIAE SUSCEPTIBILITIES PERFORMED ON PREVIOUS CULTURE WITHIN THE LAST 5 DAYS.    Report Status 06/04/2015 FINAL  Final  Urine culture     Status: Abnormal   Collection Time: 05/31/15 11:58 AM  Result Value Ref Range Status   Specimen Description URINE, RANDOM  Final   Special Requests NONE  Final    Culture MULTIPLE SPECIES PRESENT, SUGGEST RECOLLECTION (A)  Final   Report Status 06/01/2015 FINAL  Final  MRSA PCR Screening     Status: None   Collection Time: 05/31/15  3:44 PM  Result Value Ref Range Status   MRSA by PCR NEGATIVE NEGATIVE Final    Comment:        The GeneXpert MRSA Assay (FDA approved for NASAL specimens only), is one component of a comprehensive MRSA colonization surveillance program. It is not intended to diagnose MRSA infection nor to guide or monitor treatment for MRSA infections.   Culture, blood (routine x 2)     Status: Abnormal   Collection Time: 05/31/15  4:05 PM  Result Value Ref Range Status   Specimen Description BLOOD RIGHT HAND  Final   Special Requests IN PEDIATRIC BOTTLE 2CC  Final   Culture  Setup Time   Final    GRAM NEGATIVE RODS AEROBIC BOTTLE ONLY CRITICAL RESULT CALLED TO, READ BACK BY AND VERIFIED WITH: Renette Butters @0617  06/01/15 MKELLY    Culture (A)  Final    KLEBSIELLA PNEUMONIAE SUSCEPTIBILITIES PERFORMED ON PREVIOUS CULTURE WITHIN THE LAST 5 DAYS.    Report Status 06/04/2015 FINAL  Final  Culture, blood (routine x 2)     Status: Abnormal   Collection Time: 05/31/15  4:15 PM  Result Value Ref Range Status   Specimen Description BLOOD RIGHT ANTECUBITAL  Final   Special Requests IN PEDIATRIC BOTTLE 3CC  Final   Culture  Setup Time   Final    GRAM NEGATIVE RODS AEROBIC BOTTLE ONLY CRITICAL RESULT CALLED TO, READ BACK BY AND VERIFIED WITH: Ferrel Logan ,RN @ [phone] 06/01/15 MKELLY    Culture (A)  Final    KLEBSIELLA PNEUMONIAE SUSCEPTIBILITIES PERFORMED ON PREVIOUS CULTURE WITHIN THE LAST 5 DAYS.    Report Status 06/04/2015 FINAL  Final  Culture, blood (routine x 2)  Status: None (Preliminary result)   Collection Time: 06/02/15 12:25 AM  Result Value Ref Range Status   Specimen Description BLOOD RIGHT HAND  Final   Special Requests BOTTLES DRAWN AEROBIC AND ANAEROBIC 5CC   Final   Culture NO GROWTH 2 DAYS  Final   Report  Status PENDING  Incomplete  Culture, blood (routine x 2)     Status: None (Preliminary result)   Collection Time: 06/02/15 12:25 AM  Result Value Ref Range Status   Specimen Description BLOOD RIGHT ANTECUBITAL  Final   Special Requests BOTTLES DRAWN AEROBIC AND ANAEROBIC 5CC   Final   Culture NO GROWTH 2 DAYS  Final   Report Status PENDING  Incomplete     Studies:   Recent x-ray studies have been reviewed in detail by the Attending Physician  Scheduled Meds:  Scheduled Meds: . sodium chloride   Intravenous Once  . cefTRIAXone (ROCEPHIN)  IV  2 g Intravenous Q24H  . insulin aspart  0-9 Units Subcutaneous Q4H  . insulin glargine  20 Units Subcutaneous Q24H  . pantoprazole  40 mg Oral Daily    Time spent on care of this patient: 35 mins   Taye Cato T , MD   [facility] Office  [phone] Pager - Text Page per Shea Evans as per below:  On-Call/Text Page:      [email or web address]      password [email or web address]  If 7PM-7AM, please contact night-coverage [email or web address] Password [email or web address] 06/05/2015, 8:35 AM   LOS: 5 days

## 2015-06-05 NOTE — Progress Notes (Signed)
Subjective:  Patient reports feeling better this AM. Flank pain is relatively better/unchanged from yesterday.  Objective Vital signs in last 24 hours: Filed Vitals:   06/05/15 1000 06/05/15 1100 06/05/15 1135 06/05/15 1200  BP: 147/81 143/80  140/83  Pulse: 66 87  80  Temp:   98.3 F (36.8 C)   TempSrc:   Oral   Resp: 14 22  22   Height:      Weight:      SpO2: 100% 97%  97%   Weight change: 14.1 oz (0.4 kg)  Intake/Output Summary (Last 24 hours) at 06/05/15 1358 Last data filed at 06/05/15 1213  Gross per 24 hour  Intake    625 ml  Output   1230 ml  Net   -605 ml   Labs: Basic Metabolic Panel:  Recent Labs Lab 06/03/15 0217 06/04/15 0435 06/05/15 0313  NA 133* 135 133*  K 3.7 4.0 3.7  CL 97* 100* 100*  CO2 21* 21* 22  GLUCOSE 210* 152* 256*  BUN 93* 103* 66*  CREATININE 8.96* 9.57* 6.72*  CALCIUM 7.3* 7.5* 7.6*  PHOS 4.2 4.5 4.8*   Liver Function Tests:  Recent Labs Lab 06/01/15 1043 06/03/15 0217 06/04/15 0435 06/05/15 0313  BILITOT 1.5*  --   --   --   ALBUMIN  --  1.7* 1.9* 1.7*   No results for input(s): LIPASE, AMYLASE in the last 168 hours. No results for input(s): AMMONIA in the last 168 hours. CBC:  Recent Labs Lab 05/31/15 1055  06/02/15 0217 06/03/15 0217 06/04/15 0435 06/05/15 0500 06/05/15 1030  WBC 10.3  < > 12.0* 13.3* 18.3* 15.0* 17.1*  NEUTROABS 9.1*  --  10.2*  --   --   --   --   HGB 12.3*  < > 10.3* 7.8* 7.3* 6.7* 7.7*  HCT 37.0*  < > 30.2* 23.5* 21.8* 19.7* 22.9*  MCV 81.3  < > 80.3 80.5 82.0 82.4 82.4  PLT 131*  < > 132* 162 193 189 192  < > = values in this interval not displayed. Cardiac Enzymes: No results for input(s): CKTOTAL, CKMB, CKMBINDEX, TROPONINI in the last 168 hours. CBG:  Recent Labs Lab 06/04/15 1557 06/04/15 2104 06/05/15 0032 06/05/15 0729 06/05/15 1136  GLUCAP 291* 193* 253* 220* 306*    Iron Studies: No results for input(s): IRON, TIBC, TRANSFERRIN, FERRITIN in the last 72  hours. Studies/Results: No results found. Medications: Infusions:    Scheduled Medications: . carvedilol  3.125 mg Oral BID WC  . cefTRIAXone (ROCEPHIN)  IV  2 g Intravenous Q24H  . insulin aspart  0-15 Units Subcutaneous TID WC  . insulin aspart  0-5 Units Subcutaneous QHS  . pantoprazole  40 mg Oral Daily    have reviewed scheduled and prn medications.  Physical Exam: General -- oriented x3, pleasant and cooperative.   Chest -- Normal WOB on room air,  Lungs clear to auscultation. Cardiac -- RRR. No murmurs.  Abdomen -- soft, Bowel sounds present. Extremeties - trace to +1 LE edema bilaterally to anterior shins. +2DP.   Assessment/ Plan: Pt is a 75 y.o. 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 Patient was admitted to the ICU for septic shock 2/2 suspected UTI. Patient's Cr was notably elevated at 9.33, when his baseline was thought to be around 1.7-1.9. PT/INR was elevated. Blood cultures were  positive for Gram Neg Rods.   1. AKI/CKD, patient s/p left nephrectomy: Unknown etiology at this time. DDx includes ischemic ATN vs obstructive. No longer thinking HUS. Not on ACEi or NSAIDs. Positive blood culture pan-sensitive Kleb Pneumo; recollection NGTD (pending)  - UOP 225; HD removed 1L, for -940cc Net on the day; Net of 8L since admission.   - Cr 6.72, BUN 66, Bicarb 22, Hgb 6.7 (Received transfusion this AM for Hgb drop) - HD yesterday (tolerated well); To undergo HD again tomorrow. (No heparin) 2. Metabolic acidosis, anion gap: gap 14, likely 2/2 uremia. HD has helped this. 3. Hematologic: Acute blood loss anemia? -- perinephric hematoma present on CT - Hgb 6.7, INR 2.81 >> transfused 1 unit of pRBCs  - DIC panel: not conclusive; can r/o DIC, and high haptoglobin makes HUS unlikely  - Fibrinogen  >800 (metastatic dz?) - transfuse as needed per primary.  4. Urosepsis, w/ shock: Ceftriaxone. Management and pressors per CCM.  - currently not on pressors.  5. Retroperitoneal, perinephric hematoma with displacement of right kidney: No evidence of hydro.  No heparin with HD. Urology following, recommends reimaging in about 4 mths.  Per CCM, patient likely to SDU later today.   Elberta Leatherwood, MD,MS,  PGY2 06/05/2015 1:58 PM  LOS: 5 days   I have seen and examined this patient and agree with plan as outlined by Dr. Alease Frame.  Much improved today from clinical standpoint, however remains oliguric.  Plan for HD again tomorrow.  Continue to transfuse prn. Broadus John A Kodie Pick,MD 06/05/2015 2:12 PM

## 2015-06-05 NOTE — Progress Notes (Signed)
[facility] Progress Note Patient Name: STORY BRAKEMAN DOB: Feb 19, 1940 MRN: [id number]   Date of Service  06/05/2015  HPI/Events of Note  Hgb drop to 6.7 from 7.3  eICU Interventions  Transfuse 1 unit pRBC Post-transfusion CBC     Intervention Category Intermediate Interventions: Other:  DETERDING,ELIZABETH 06/05/2015, 5:32 AM

## 2015-06-05 NOTE — Progress Notes (Signed)
Paged McClung x4 about pt high CBG. CBG trending up. Awaiting return page.

## 2015-06-06 DIAGNOSIS — R310 Gross hematuria: Secondary | ICD-10-CM

## 2015-06-06 LAB — RENAL FUNCTION PANEL
ANION GAP: 13 (ref 5–15)
Albumin: 1.8 g/dL — ABNORMAL LOW (ref 3.5–5.0)
BUN: 79 mg/dL — ABNORMAL HIGH (ref 6–20)
CALCIUM: 7.9 mg/dL — AB (ref 8.9–10.3)
CHLORIDE: 98 mmol/L — AB (ref 101–111)
CO2: 22 mmol/L (ref 22–32)
CREATININE: 7.24 mg/dL — AB (ref 0.61–1.24)
GFR, EST AFRICAN AMERICAN: 8 mL/min — AB (ref >60)
GFR, EST NON AFRICAN AMERICAN: 7 mL/min — AB (ref >60)
Glucose, Bld: 226 mg/dL — ABNORMAL HIGH (ref 65–99)
Phosphorus: 5.3 mg/dL — ABNORMAL HIGH (ref 2.5–4.6)
Potassium: 4.2 mmol/L (ref 3.5–5.1)
SODIUM: 133 mmol/L — AB (ref 135–145)

## 2015-06-06 LAB — HEPATIC FUNCTION PANEL
ALT: 15 U/L — ABNORMAL LOW (ref 17–63)
AST: 18 U/L (ref 15–41)
Albumin: 1.9 g/dL — ABNORMAL LOW (ref 3.5–5.0)
Alkaline Phosphatase: 64 U/L (ref 38–126)
Bilirubin, Direct: 0.2 mg/dL (ref 0.1–0.5)
Indirect Bilirubin: 0.4 mg/dL (ref 0.3–0.9)
Total Bilirubin: 0.6 mg/dL (ref 0.3–1.2)
Total Protein: 5.2 g/dL — ABNORMAL LOW (ref 6.5–8.1)

## 2015-06-06 LAB — CBC
HCT: 22.3 % — ABNORMAL LOW (ref 39.0–52.0)
Hemoglobin: 7.6 g/dL — ABNORMAL LOW (ref 13.0–17.0)
MCH: 27.8 pg (ref 26.0–34.0)
MCHC: 34.1 g/dL (ref 30.0–36.0)
MCV: 81.7 fL (ref 78.0–100.0)
Platelets: 206 K/uL (ref 150–400)
RBC: 2.73 MIL/uL — ABNORMAL LOW (ref 4.22–5.81)
RDW: 16.8 % — ABNORMAL HIGH (ref 11.5–15.5)
WBC: 17.8 K/uL — ABNORMAL HIGH (ref 4.0–10.5)

## 2015-06-06 LAB — GLUCOSE, CAPILLARY
GLUCOSE-CAPILLARY: 249 mg/dL — AB (ref 65–99)
GLUCOSE-CAPILLARY: 229 mg/dL — AB (ref 65–99)
Glucose-Capillary: 293 mg/dL — ABNORMAL HIGH (ref 65–99)

## 2015-06-06 LAB — TYPE AND SCREEN
ABO/RH(D): A POS
Antibody Screen: NEGATIVE
Unit division: 0

## 2015-06-06 LAB — HEPARIN LEVEL (UNFRACTIONATED): Heparin Unfractionated: 0.16 IU/mL — ABNORMAL LOW (ref 0.30–0.70)

## 2015-06-06 LAB — PROTIME-INR
INR: 2.04 — ABNORMAL HIGH (ref 0.00–1.49)
Prothrombin Time: 22.9 s — ABNORMAL HIGH (ref 11.6–15.2)

## 2015-06-06 LAB — APTT: APTT: 31 s (ref 24–37)

## 2015-06-06 MED ORDER — ZOLPIDEM TARTRATE 5 MG PO TABS
5.0000 mg | ORAL_TABLET | Freq: Once | ORAL | Status: AC
  Administered 2015-06-06: 5 mg via ORAL
  Filled 2015-06-06: qty 1

## 2015-06-06 MED ORDER — DIPHENHYDRAMINE HCL 25 MG PO CAPS: 25.0000 mg | ORAL_CAPSULE | Freq: Once | ORAL | Status: DC

## 2015-06-06 MED ORDER — INSULIN ASPART 100 UNIT/ML ~~LOC~~ SOLN
6.0000 [IU] | Freq: Three times a day (TID) | SUBCUTANEOUS | Status: DC
  Administered 2015-06-06 – 2015-06-07 (×3): 6 [IU] via SUBCUTANEOUS

## 2015-06-06 MED ORDER — HEPARIN (PORCINE) IN NACL 100-0.45 UNIT/ML-% IJ SOLN
2150.0000 [IU]/h | INTRAMUSCULAR | Status: DC
  Administered 2015-06-06: 1950 [IU]/h via INTRAVENOUS
  Administered 2015-06-06: 1700 [IU]/h via INTRAVENOUS
  Administered 2015-06-07: 2100 [IU]/h via INTRAVENOUS
  Administered 2015-06-07: 2150 [IU]/h via INTRAVENOUS
  Filled 2015-06-06 (×7): qty 250

## 2015-06-06 MED ORDER — DILTIAZEM HCL 60 MG PO TABS
60.0000 mg | ORAL_TABLET | Freq: Three times a day (TID) | ORAL | Status: DC
Start: 2015-06-06 — End: 2015-06-08
  Administered 2015-06-06 – 2015-06-07 (×3): 60 mg via ORAL
  Filled 2015-06-06 (×7): qty 1

## 2015-06-06 MED ORDER — INSULIN GLARGINE 100 UNIT/ML ~~LOC~~ SOLN
10.0000 [IU] | Freq: Every day | SUBCUTANEOUS | Status: DC
  Administered 2015-06-06: 10 [IU] via SUBCUTANEOUS
  Filled 2015-06-06 (×2): qty 0.1

## 2015-06-06 NOTE — Progress Notes (Signed)
[place] TEAM 1 - Stepdown/ICU TEAM PROGRESS NOTE  [name] [id number] DOB: 1940/06/21 DOA: 05/31/2015 PCP: Melinda Crutch  Admit HPI / Brief Narrative: 75 y.o. male with hx of prostate CA s/p radiation 2011 and RCC s/p left nephrectomy 1999 who was seen in ED 4/22 for clogged urinary catheter which was irrigated and replaced. 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  He had been in his USOH until roughly 4/20 when he had decreased appetite. He had not been eating or drinking for 5 days. On 04/25 he went to have his INR checked (on coumadin for A.fib and hx DVT / PE) and was found to have INR of 8 and was hypotensive with SBP 70/44. He was sent to the ED for further evaluation.  In the ED, he was found to have UTI and despite 4.5L IVF, he remained hypotensive. He also had multiple metabolic derangements including Na 124, K 5.0, AGMA, lactate 3, SCr 9.36. PCCM was consulted for admission w/ septic shock due to urosepsis as well as AKI.  Significant Events: 4/25 admit with septic shock due to UTI + ARF 4/30 [facility] assumed care   HPI/Subjective: Pt is resting comfortably in bed.  He denies cp, sob, abdom pain, or n/v.  He denies flank pain or back pain.    Assessment/Plan:  Septic Shock and bacteremia due to Klebsiella Pyelonephritis  Continue directed abx tx - shock and sepsis physiology resolved - will needed extended course of abx given bacteremia - re-image perinephric hematoma if develops fevers or other signs of persisting infxn  Acute Renal Failure (hx L nephrectomy 1999) Nephrology following - UOP still scant - requiring HD - etiology unclear   Recent Labs Lab 06/01/15 0218 06/02/15 0217 06/03/15 0217 06/04/15 0435 06/05/15 0313 06/06/15 0210  CREATININE 9.33* 9.67* 8.96* 9.57* 6.72* 7.24*    Perinephric hematoma Urology has seen and suggests no acute intervention is required presently - transitioning  anticoag to heparin for ease of reversibility - repeat imaging if signs of infxn or worsening blood loss   Acute blood loss anemia  Hgb had been on a steady decline since 4/25 - somewhat explained by dilution (+~8L since admit) but also worrisome for ongoing blood loss - pt hemodynamically stable - transfused 1U 4/30 - dosed w/ vitamin K as INR was climbing (cover w/ heparin as pt high risk w/ hx afib + PE)   Recent Labs Lab 06/02/15 0217 06/03/15 0217 06/04/15 0435 06/05/15 0500 06/05/15 1030 06/06/15 0210  HGB 10.3* 7.8* 7.3* 6.7* 7.7* 7.6*    Coagulopathy Pt is s/p 6U total FFP since admit, and has still never been fully corrected - dosed w/ vitamin K 4/30  - given high risk of clotting event (Afib + hx PE) will still cover w/ heparin IV - given marked uremia dosed w/ ddavp to improve Plt clotting function   Recent Labs Lab 06/02/15 0217 06/03/15 0217 06/04/15 0435 06/05/15 0500 06/05/15 1710 06/06/15 0210  INR 2.12* 3.13* 2.53* 2.81* 2.34* 2.04*    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  Hx prostate CA s/p radiation 2011  RCC s/p left nephrectomy 1999  DM - diet controlled CBG still poorly controlled - adjust tx again and follow  Code Status: FULL Family Communication: spoke w/ wife at bedside  Disposition Plan: SDU   Consultants: PCCM Nephrology Urology   Antibiotics: Ceftaz 04/25 > Vanc 04/25 > 04/25  DVT  prophylaxis: IV heparin  Objective: Blood pressure 156/92, pulse 63, temperature 98 F (36.7 C), temperature source Oral, resp. rate 18, height 5\' 10"  (1.778 m), weight 132.4 kg (291 lb 14.2 oz), SpO2 100 %.  Intake/Output Summary (Last 24 hours) at 06/06/15 0839 Last data filed at 06/06/15 0600  Gross per 24 hour  Intake    450 ml  Output    455 ml  Net     -5 ml   Exam: General: No acute respiratory distress - alert/conversant  Lungs: Clear to auscultation bilaterally - no wheezes or crackles Cardiovascular: Regular rate and irregular rhythm without murmur   Abdomen: Nontender, obese, soft, bowel sounds positive, no rebound, no ascites, no appreciable mass Extremities: No significant cyanosis, or clubbing;  stable 1+ edema bilateral lower extremities  Data Reviewed: Basic Metabolic Panel:  Recent Labs Lab 06/01/15 0218 06/02/15 0217 06/03/15 0217 06/04/15 0435 06/05/15 0313 06/06/15 0210  NA 129* 131* 133* 135 133* 133*  K 4.3 3.6 3.7 4.0 3.7 4.2  CL 100* 97* 97* 100* 100* 98*  CO2 13* 14* 21* 21* 22 22  GLUCOSE 194* 264* 210* 152* 256* 226*  BUN 122* 111* 93* 103* 66* 79*  CREATININE 9.33* 9.67* 8.96* 9.57* 6.72* 7.24*  CALCIUM 7.2* 7.2* 7.3* 7.5* 7.6* 7.9*  MG 2.1  --   --   --   --   --   PHOS 5.1*  --  4.2 4.5 4.8* 5.3*    CBC:  Recent Labs Lab 05/31/15 1055  06/02/15 0217 06/03/15 0217 06/04/15 0435 06/05/15 0500 06/05/15 1030 06/06/15 0210  WBC 10.3  < > 12.0* 13.3* 18.3* 15.0* 17.1* 17.8*  NEUTROABS 9.1*  --  10.2*  --   --   --   --   --   HGB 12.3*  < > 10.3* 7.8* 7.3* 6.7* 7.7* 7.6*  HCT 37.0*  < > 30.2* 23.5* 21.8* 19.7* 22.9* 22.3*  MCV 81.3  < > 80.3 80.5 82.0 82.4 82.4 81.7  PLT 131*  < > 132* 162 193 189 192 206  < > = values in this interval not displayed.  Liver Function Tests:  Recent Labs Lab 06/01/15 1043 06/03/15 0217 06/04/15 0435 06/05/15 0313 06/06/15 0210  AST  --   --   --   --  18  ALT  --   --   --   --  15*    ALKPHOS  --   --   --   --  64  BILITOT 1.5*  --   --   --  0.6  PROT  --   --   --   --  5.2*  ALBUMIN  --  1.7* 1.9* 1.7* 1.9*  1.8*    Coags:  Recent Labs Lab 06/03/15 0217 06/04/15 0435 06/05/15 0500 06/05/15 1710 06/06/15 0210  INR 3.13* 2.53* 2.81* 2.34* 2.04*    Recent Labs Lab 06/01/15 1043 06/05/15 0500  06/06/15 0210  APTT 31 36 31   CBG:  Recent Labs Lab 06/05/15 0341 06/05/15 0729 06/05/15 1136 06/05/15 1550 06/05/15 2226  GLUCAP 249* 220* 306* 289* 247*    Recent Results (from the past 240 hour(s))  Blood culture (routine x 2)     Status: Abnormal   Collection Time: 05/31/15 10:55 AM  Result Value Ref Range Status   Specimen Description BLOOD RIGHT HAND  Final   Special Requests BOTTLES DRAWN AEROBIC AND ANAEROBIC 5CC  Final   Culture  Setup Time   Final    GRAM NEGATIVE RODS IN BOTH AEROBIC AND ANAEROBIC BOTTLES CRITICAL RESULT CALLED TO, READ BACK BY AND VERIFIED WITH: Archie Balboa RN [id number] 05/31/15 A BROWNING    Culture KLEBSIELLA PNEUMONIAE (A)  Final   Report Status 06/04/2015 FINAL  Final   Organism ID, Bacteria KLEBSIELLA PNEUMONIAE  Final      Susceptibility   Klebsiella pneumoniae - MIC*    AMPICILLIN 16 RESISTANT Resistant     CEFAZOLIN <=4 SENSITIVE Sensitive     CEFEPIME <=1 SENSITIVE Sensitive     CEFTAZIDIME <=1 SENSITIVE Sensitive     CEFTRIAXONE <=1 SENSITIVE Sensitive     CIPROFLOXACIN <=0.25 SENSITIVE Sensitive     GENTAMICIN <=1 SENSITIVE Sensitive     IMIPENEM <=0.25 SENSITIVE Sensitive     TRIMETH/SULFA <=20 SENSITIVE Sensitive     AMPICILLIN/SULBACTAM <=2 SENSITIVE Sensitive     PIP/TAZO <=4 SENSITIVE Sensitive     * KLEBSIELLA PNEUMONIAE  Blood culture (routine x 2)     Status: Abnormal   Collection Time: 05/31/15 11:35 AM  Result Value Ref Range Status   Specimen Description BLOOD RIGHT HAND  Final   Special Requests BOTTLES DRAWN AEROBIC ONLY 5CC  Final   Culture  Setup Time   Final    GRAM NEGATIVE  RODS AEROBIC BOTTLE ONLY CRITICAL RESULT CALLED TO, READ BACK BY AND VERIFIED WITH: Renette Butters @0026  06/01/15 MKELLY    Culture (A)  Final    KLEBSIELLA PNEUMONIAE SUSCEPTIBILITIES PERFORMED ON PREVIOUS CULTURE WITHIN THE LAST 5 DAYS.    Report Status 06/04/2015 FINAL  Final  Urine culture     Status: Abnormal   Collection Time: 05/31/15 11:58 AM  Result Value Ref Range Status   Specimen Description URINE, RANDOM  Final   Special Requests NONE  Final   Culture MULTIPLE SPECIES PRESENT, SUGGEST RECOLLECTION (A)  Final   Report Status 06/01/2015 FINAL  Final  MRSA PCR Screening     Status: None   Collection Time: 05/31/15  3:44 PM  Result Value Ref Range Status   MRSA by PCR NEGATIVE NEGATIVE Final    Comment:        The GeneXpert MRSA Assay (FDA approved for NASAL specimens only), is one component of a comprehensive MRSA colonization surveillance program. It is not intended to diagnose MRSA infection nor to guide or monitor treatment for MRSA infections.   Culture, blood (routine x 2)     Status: Abnormal   Collection Time: 05/31/15  4:05 PM  Result Value Ref Range Status   Specimen Description BLOOD RIGHT HAND  Final   Special Requests IN PEDIATRIC BOTTLE 2CC  Final   Culture  Setup Time   Final    GRAM NEGATIVE RODS AEROBIC BOTTLE ONLY CRITICAL RESULT CALLED TO, READ BACK BY AND VERIFIED WITH: Renette Butters @0617  06/01/15 MKELLY    Culture (A)  Final    KLEBSIELLA PNEUMONIAE SUSCEPTIBILITIES PERFORMED ON PREVIOUS CULTURE WITHIN  THE LAST 5 DAYS.    Report Status 06/04/2015 FINAL  Final  Culture, blood (routine x 2)     Status: Abnormal   Collection Time: 05/31/15  4:15 PM  Result Value Ref Range Status   Specimen Description BLOOD RIGHT ANTECUBITAL  Final   Special Requests IN PEDIATRIC BOTTLE 3CC  Final   Culture  Setup Time   Final    GRAM NEGATIVE RODS AEROBIC BOTTLE ONLY CRITICAL RESULT CALLED TO, READ BACK BY AND VERIFIED WITH: Ferrel Logan ,RN @ [phone] 06/01/15  MKELLY    Culture (A)  Final    KLEBSIELLA PNEUMONIAE SUSCEPTIBILITIES PERFORMED ON PREVIOUS CULTURE WITHIN THE LAST 5 DAYS.    Report Status 06/04/2015 FINAL  Final  Culture, blood (routine x 2)     Status: None (Preliminary result)   Collection Time: 06/02/15 12:25 AM  Result Value Ref Range Status   Specimen Description BLOOD RIGHT HAND  Final   Special Requests BOTTLES DRAWN AEROBIC AND ANAEROBIC 5CC   Final   Culture NO GROWTH 3 DAYS  Final   Report Status PENDING  Incomplete  Culture, blood (routine x 2)     Status: None (Preliminary result)   Collection Time: 06/02/15 12:25 AM  Result Value Ref Range Status   Specimen Description BLOOD RIGHT ANTECUBITAL  Final   Special Requests BOTTLES DRAWN AEROBIC AND ANAEROBIC 5CC   Final   Culture NO GROWTH 3 DAYS  Final   Report Status PENDING  Incomplete     Studies:   Recent x-ray studies have been reviewed in detail by the Attending Physician  Scheduled Meds:  Scheduled Meds: . carvedilol  6.25 mg Oral BID WC  . cefTRIAXone (ROCEPHIN)  IV  2 g Intravenous Q24H  . diltiazem  30 mg Oral Q8H  . diphenhydrAMINE  25 mg Oral Once  . insulin aspart  0-20 Units Subcutaneous TID WC  . insulin aspart  0-5 Units Subcutaneous QHS  . insulin aspart  3 Units Subcutaneous TID WC  . pantoprazole  40 mg Oral Daily    Time spent on care of this patient: 35 mins   Zaara Sprowl T , MD   [facility] Office  [phone] Pager - Text Page per Shea Evans as per below:  On-Call/Text Page:      [email or web address]      password [email or web address]  If 7PM-7AM, please contact night-coverage [email or web address] Password [email or web address] 06/06/2015, 8:39 AM   LOS: 6 days

## 2015-06-06 NOTE — Progress Notes (Signed)
ANTICOAGULATION CONSULT NOTE - Initial Consult  Pharmacy Consult for heparin Indication: atrial fibrillation, Hx of pulmonary embolus and DVT  No Known Allergies  Patient Measurements: Height: 5\' 10"  (177.8 cm) Weight: 291 lb 14.2 oz (132.4 kg) IBW/kg (Calculated) : 73 Heparin Dosing Weight: 103.2 kg  Vital Signs: Temp: 98.2 F (36.8 C) (05/01 0432) Temp Source: Oral (05/01 0432) BP: 162/84 mmHg (05/01 0700) Pulse Rate: 82 (05/01 0700)  Labs:  Recent Labs  06/04/15 0435 06/05/15 0313 06/05/15 0500 06/05/15 1030 06/05/15 1710 06/06/15 0210  HGB 7.3*  --  6.7* 7.7*  --  7.6*  HCT 21.8*  --  19.7* 22.9*  --  22.3*  PLT 193  --  189 192  --  206  APTT  --   --  36  --   --  31  LABPROT 26.9*  --  29.1*  --  25.4* 22.9*  INR 2.53*  --  2.81*  --  2.34* 2.04*  CREATININE 9.57* 6.72*  --   --   --  7.24*    Estimated Creatinine Clearance: 12.4 mL/min (by C-G formula based on Cr of 7.24).   Medical History: Past Medical History  Diagnosis Date  . Obesity   . Phlebitis     Lower extermity  . Pulmonary emboli ([place]) 2008    submassive, saddle  . Prostate cancer ([place]) 07/2009  . Sleep apnea     on CPAP  . Hx of echocardiogram 12/04/2010    Normal EF >55% no significant valve disease  . History of stress test 06/27/2009    Low risk and EF of approximately 50%  . DVT (deep venous thrombosis) ([place])   . Chronic kidney disease, stage 3     baseline creatinine ~1.4  . HLD (hyperlipidemia)   . HTN (hypertension)   . Dysrhythmia     A fib  . Diabetes mellitus ([place])     diet controlled  . History of hiatal hernia     Assessment: 43 yom with afib, hx of DVT/PE on warfarin pta. Pharmacy consulted to dose heparin when INR<2. INR 2.04 this AM ~0200. Will start heparin as INR likely <2 now. Hg low stable 7.6, plt wnl, chronic hematuria hx.   Goal of Therapy:  Heparin level 0.3-0.7 units/ml Monitor platelets by anticoagulation protocol: Yes   Plan:  No bolus Start  heparin at 1700 units/h 8h HL, daily HL/CBC Monitor s/sx bleeding Hold warfarin  Elicia Lamp, PharmD, [facility] Clinical Pharmacist Pager [phone] 06/06/2015 7:29 AM

## 2015-06-06 NOTE — Progress Notes (Signed)
ANTICOAGULATION CONSULT NOTE - Follow Up Consult  Pharmacy Consult for heparin Indication: atrial fibrillation, Hx of pulmonary embolus and DVT  No Known Allergies  Patient Measurements: Height: 5\' 10"  (177.8 cm) Weight: 292 lb 12.3 oz (132.8 kg) IBW/kg (Calculated) : 73 Heparin Dosing Weight: 103.2 kg  Vital Signs: Temp: 98 F (36.7 C) (05/01 1416) Temp Source: Oral (05/01 1416) BP: 161/82 mmHg (05/01 1630) Pulse Rate: 62 (05/01 1630)  Labs:  Recent Labs  06/04/15 0435 06/05/15 0313 06/05/15 0500 06/05/15 1030 06/05/15 1710 06/06/15 0210 06/06/15 1615  HGB 7.3*  --  6.7* 7.7*  --  7.6*  --   HCT 21.8*  --  19.7* 22.9*  --  22.3*  --   PLT 193  --  189 192  --  206  --   APTT  --   --  36  --   --  31  --   LABPROT 26.9*  --  29.1*  --  25.4* 22.9*  --   INR 2.53*  --  2.81*  --  2.34* 2.04*  --   HEPARINUNFRC  --   --   --   --   --   --  0.16*  CREATININE 9.57* 6.72*  --   --   --  7.24*  --     Estimated Creatinine Clearance: 12.5 mL/min (by C-G formula based on Cr of 7.24).   Medications:  Infusions:  . heparin 1,700 Units/hr (06/06/15 0830)    Assessment: 31 yom with afib, hx of DVT/PE on warfarin pta. Pharmacy consulted to dose heparin when INR<2. INR down to 2.04 this morning.  HL is subtherapeutic at 0.16 on 1700 units/hr; no heparin with HD today. Has perinephric hematoma with plan for ultrasound in several months to ensure resolution. No bleeding noted but has chronic hematuria, no problems with bleeding per HD RN.   Goal of Therapy:  Heparin level 0.3-0.7 units/ml Monitor platelets by anticoagulation protocol: Yes   Plan:  - Increase heparin drip to 1950 units/hr - 8 hr HL - Daily HL and CBC - Monitor for s/sx of bleeding - Warfarin on hold  [facility], [place].D., BCPS Clinical Pharmacist Pager: [phone] 06/06/2015 5:13 PM

## 2015-06-06 NOTE — Progress Notes (Signed)
Assessment/ Plan: Pt is a 75 y.o. yo male who was admitted on 05/31/2015 with a PMH significant for prostate CA, RCC s/p left nephrectomy, A Fib, h/o DVT/PE (on coumadin), obesity, and long-term urinary catheterization w/ occasional hematuria. Patient was admitted to the ICU for septic shock 2/2. Patient's Cr was notably elevated at 9.33, his baseline is thought to be around 1.7-1.9. PT/INR was elevated.   1. AKI/CKD, patient s/p left nephrectomy: DDx includes ischemic ATN vs obstructive. Not on ACEi or NSAIDs. 2.  Positive blood culture pan-sensitive Kleb Pneumo - HD today 3. Hematologic: Acute blood loss anemia? -- perinephric hematoma present on CT 4. Urosepsis: On Ceftriaxone.   5. Retroperitoneal, perinephric hematoma with displacement of right kidney: No evidence of hydro. No heparin with HD. Urology following, recommends reimaging in about 4 mths.    Subjective: Interval History: Feels better  Objective: Vital signs in last 24 hours: Temp:  [97.4 F (36.3 C)-98.8 F (37.1 C)] 98 F (36.7 C) (05/01 0730) Pulse Rate:  [48-93] 82 (05/01 0900) Resp:  [14-26] 22 (05/01 0900) BP: (127-173)/(72-96) 157/94 mmHg (05/01 0900) SpO2:  [97 %-100 %] 97 % (05/01 0900) Weight:  [132.4 kg (291 lb 14.2 oz)] 132.4 kg (291 lb 14.2 oz) (05/01 0500) Weight change: 1 kg (2 lb 3.3 oz)  Intake/Output from previous day: 04/30 0701 - 05/01 0700 In: 450 [Blood:300; IV Piggyback:150] Out: 480 [Urine:480] Intake/Output this shift:    General appearance: alert and cooperative Resp: clear to auscultation bilaterally Chest wall: no tenderness Cardio: regular rate and rhythm, S1, S2 normal, no murmur, click, rub or gallop GI: soft, non-tender; bowel sounds normal; no masses,  no organomegaly  Lab Results:  Recent Labs  06/05/15 1030 06/06/15 0210  WBC 17.1* 17.8*  HGB 7.7* 7.6*  HCT 22.9* 22.3*  PLT 192 206   BMET:  Recent Labs  06/05/15 0313 06/06/15 0210  NA 133* 133*  K  3.7 4.2  CL 100* 98*  CO2 22 22  GLUCOSE 256* 226*  BUN 66* 79*  CREATININE 6.72* 7.24*  CALCIUM 7.6* 7.9*   No results for input(s): PTH in the last 72 hours. Iron Studies: No results for input(s): IRON, TIBC, TRANSFERRIN, FERRITIN in the last 72 hours. Studies/Results: No results found.  Scheduled: . carvedilol  6.25 mg Oral BID WC  . cefTRIAXone (ROCEPHIN)  IV  2 g Intravenous Q24H  . diltiazem  60 mg Oral Q8H  . diphenhydrAMINE  25 mg Oral Once  . insulin aspart  0-20 Units Subcutaneous TID WC  . insulin aspart  0-5 Units Subcutaneous QHS  . insulin aspart  6 Units Subcutaneous TID WC  . insulin glargine  10 Units Subcutaneous QHS  . pantoprazole  40 mg Oral Daily      LOS: 6 days   Elajah Kunsman C 06/06/2015,9:33 AM

## 2015-06-06 NOTE — Progress Notes (Signed)
59M admitted for sepsis from presumed urinary tract source with associated acute renal failure and perinephric hematoma.  He has numerous urologic issues that I follow.    1- Prostate cancer: 4+4=8, Neoadjuvant/adj ADT w/ IMRT 01/2010 (Dr. Valere Dross). Final planned ADT 12/2010 (Lupron 6 month) Last ADT: 12/2010 (6 month inj) PSA: 0.20 on 03/28/15 0.14 on 12/20/14 0.11 09/09/14 0.06 03/15/14 0.01 07/20/13  2- Gross hematuria: Radiation cystitis, requiring 8 units transfusion of PRBCs in 2015. Hyperbaric oxygen- completed 06/2013. 03/04/13- cysto/clot evac 04/08/13- cysto/clot evac  3- Enlarged prostate: 153cc TRUS 2011 LUTS: urgency, weak stream with subsequent development of urinary retention.  Currently not interested in aggressive treatment.  Last voiding trial 08/02/13 - failed.  Now has catheter which has been exchanged on monthly basis.  Last changed on 05/11/15.  4- Phimosis: 11/2012- dorsal slit in clinic   5- Renal cell carcinoma: left nephrectomy 1999, grade 3, pT2. Surveillance: CT 2006 & 2009 negative  CT scan in 03/2014 -non contrast - simple cysts, no evidence of recurrence in right kidney  Coumadin: h/o PE, chronic DVT   Jan 2017 Interval hx: started on doxycycline for testicular pain and hematuria. both have resolved.  Intv: Mr. Bielec has been started on HD for acute renal failure presumably from his sepsis/anemia.   His hemoglobin remains low but stable after transfusion yesterday.  Etiology of his perinephric hematoma unclear - but likely hemorrhage from his current infection and supratheurapeutic INR.  He has had no recurrence of his RCC since 1999 and his most recent CT scan in Feb 2016 showed no suspicious lesions.    He has no on-going flank pain.  No current facility-administered medications on file prior to encounter.   Current Outpatient Prescriptions on File Prior to Encounter  Medication Sig Dispense Refill  . carvedilol (COREG) 3.125 MG tablet take 1 tablet by mouth  twice a day with meals 60 tablet 5  . fenofibrate (TRICOR) 145 MG tablet Take 145 mg by mouth daily.     . ferrous sulfate 325 (65 FE) MG EC tablet take 1 tablet by mouth once daily 30 tablet 6  . furosemide (LASIX) 40 MG tablet take 1 tablet by mouth once daily 30 tablet 6  . HYDROcodone-acetaminophen (NORCO/VICODIN) 5-325 MG tablet Take 1-2 tablets by mouth every 4 (four) hours as needed (breakthrough pain). 80 tablet 0  . Niacin CR 1000 MG TBCR Take 1,000 mg by mouth at bedtime.     . polyvinyl alcohol (LIQUIFILM TEARS) 1.4 % ophthalmic solution Place 1 drop into both eyes 2 (two) times daily as needed for dry eyes.    . potassium chloride (K-DUR,KLOR-CON) 10 MEQ tablet take 1 tablet by mouth once daily 30 tablet 5  . quinapril (ACCUPRIL) 20 MG tablet Take 20 mg by mouth daily.   0  . warfarin (COUMADIN) 1 MG tablet Take 1 mg by mouth every Monday, Wednesday, and Friday.    . warfarin (COUMADIN) 5 MG tablet Take 5-6 mg by mouth daily. 6 mg on Monday, Wednesday, and Friday.  Take 5 mg on Tuesday, Thursday, Saturday, and Sunday.     PE: Filed Vitals:   06/06/15 0200 06/06/15 0300 06/06/15 0400 06/06/15 0432  BP: 149/72 158/88 145/83   Pulse: 48 75 66   Temp:    98.2 F (36.8 C)  TempSrc:    Oral  Resp: 16 21 18    Height:      Weight:      SpO2: 98% 98% 97%  NAD Abdomen is soft Foley is draining clear urine - albeit scant   Recent Labs  06/05/15 0500 06/05/15 1030 06/06/15 0210  WBC 15.0* 17.1* 17.8*  HGB 6.7* 7.7* 7.6*  HCT 19.7* 22.9* 22.3*    Recent Labs  06/04/15 0435 06/05/15 0313 06/06/15 0210  NA 135 133* 133*  K 4.0 3.7 4.2  CL 100* 100* 98*  CO2 21* 22 22  GLUCOSE 152* 256* 226*  BUN 103* 66* 79*  CREATININE 9.57* 6.72* 7.24*  CALCIUM 7.5* 7.6* 7.9*    Recent Labs  06/05/15 0500 06/05/15 1710 06/06/15 0210  INR 2.81* 2.34* 2.04*   No results for input(s): PSA in the last 72 hours. No results for input(s): LABURIN in the last 72 hours. Results  for orders placed or performed during the hospital encounter of 05/31/15  Blood culture (routine x 2)     Status: Abnormal   Collection Time: 05/31/15 10:55 AM  Result Value Ref Range Status   Specimen Description BLOOD RIGHT HAND  Final   Special Requests BOTTLES DRAWN AEROBIC AND ANAEROBIC 5CC  Final   Culture  Setup Time   Final    GRAM NEGATIVE RODS IN BOTH AEROBIC AND ANAEROBIC BOTTLES CRITICAL RESULT CALLED TO, READ BACK BY AND VERIFIED WITH: Archie Balboa RN [id number] 05/31/15 A BROWNING    Culture KLEBSIELLA PNEUMONIAE (A)  Final   Report Status 06/04/2015 FINAL  Final   Organism ID, Bacteria KLEBSIELLA PNEUMONIAE  Final      Susceptibility   Klebsiella pneumoniae - MIC*    AMPICILLIN 16 RESISTANT Resistant     CEFAZOLIN <=4 SENSITIVE Sensitive     CEFEPIME <=1 SENSITIVE Sensitive     CEFTAZIDIME <=1 SENSITIVE Sensitive     CEFTRIAXONE <=1 SENSITIVE Sensitive     CIPROFLOXACIN <=0.25 SENSITIVE Sensitive     GENTAMICIN <=1 SENSITIVE Sensitive     IMIPENEM <=0.25 SENSITIVE Sensitive     TRIMETH/SULFA <=20 SENSITIVE Sensitive     AMPICILLIN/SULBACTAM <=2 SENSITIVE Sensitive     PIP/TAZO <=4 SENSITIVE Sensitive     * KLEBSIELLA PNEUMONIAE  Blood culture (routine x 2)     Status: Abnormal   Collection Time: 05/31/15 11:35 AM  Result Value Ref Range Status   Specimen Description BLOOD RIGHT HAND  Final   Special Requests BOTTLES DRAWN AEROBIC ONLY 5CC  Final   Culture  Setup Time   Final    GRAM NEGATIVE RODS AEROBIC BOTTLE ONLY CRITICAL RESULT CALLED TO, READ BACK BY AND VERIFIED WITH: Renette Butters @0026  06/01/15 MKELLY    Culture (A)  Final    KLEBSIELLA PNEUMONIAE SUSCEPTIBILITIES PERFORMED ON PREVIOUS CULTURE WITHIN THE LAST 5 DAYS.    Report Status 06/04/2015 FINAL  Final  Urine culture     Status: Abnormal   Collection Time: 05/31/15 11:58 AM  Result Value Ref Range Status   Specimen Description URINE, RANDOM  Final   Special Requests NONE  Final   Culture MULTIPLE SPECIES  PRESENT, SUGGEST RECOLLECTION (A)  Final   Report Status 06/01/2015 FINAL  Final  MRSA PCR Screening     Status: None   Collection Time: 05/31/15  3:44 PM  Result Value Ref Range Status   MRSA by PCR NEGATIVE NEGATIVE Final    Comment:        The GeneXpert MRSA Assay (FDA approved for NASAL specimens only), is one component of a comprehensive MRSA colonization surveillance program. It is not intended to diagnose MRSA infection nor to guide or monitor  treatment for MRSA infections.   Culture, blood (routine x 2)     Status: Abnormal   Collection Time: 05/31/15  4:05 PM  Result Value Ref Range Status   Specimen Description BLOOD RIGHT HAND  Final   Special Requests IN PEDIATRIC BOTTLE 2CC  Final   Culture  Setup Time   Final    GRAM NEGATIVE RODS AEROBIC BOTTLE ONLY CRITICAL RESULT CALLED TO, READ BACK BY AND VERIFIED WITH: Renette Butters @0617  06/01/15 MKELLY    Culture (A)  Final    KLEBSIELLA PNEUMONIAE SUSCEPTIBILITIES PERFORMED ON PREVIOUS CULTURE WITHIN THE LAST 5 DAYS.    Report Status 06/04/2015 FINAL  Final  Culture, blood (routine x 2)     Status: Abnormal   Collection Time: 05/31/15  4:15 PM  Result Value Ref Range Status   Specimen Description BLOOD RIGHT ANTECUBITAL  Final   Special Requests IN PEDIATRIC BOTTLE 3CC  Final   Culture  Setup Time   Final    GRAM NEGATIVE RODS AEROBIC BOTTLE ONLY CRITICAL RESULT CALLED TO, READ BACK BY AND VERIFIED WITH: Ferrel Logan ,RN @ [phone] 06/01/15 MKELLY    Culture (A)  Final    KLEBSIELLA PNEUMONIAE SUSCEPTIBILITIES PERFORMED ON PREVIOUS CULTURE WITHIN THE LAST 5 DAYS.    Report Status 06/04/2015 FINAL  Final  Culture, blood (routine x 2)     Status: None (Preliminary result)   Collection Time: 06/02/15 12:25 AM  Result Value Ref Range Status   Specimen Description BLOOD RIGHT HAND  Final   Special Requests BOTTLES DRAWN AEROBIC AND ANAEROBIC 5CC   Final   Culture NO GROWTH 3 DAYS  Final   Report Status PENDING  Incomplete   Culture, blood (routine x 2)     Status: None (Preliminary result)   Collection Time: 06/02/15 12:25 AM  Result Value Ref Range Status   Specimen Description BLOOD RIGHT ANTECUBITAL  Final   Special Requests BOTTLES DRAWN AEROBIC AND ANAEROBIC 5CC   Final   Culture NO GROWTH 3 DAYS  Final   Report Status PENDING  Incomplete     IMaging: I have independently reviewed Mr. Sunga's recent CT scan showing a large right sided perinephric hematoma.  Imp/Rec: Chronic urinary retention - hasn't passed a voiding trial since March 2015, catheters are being changed on a monthly basis.  Next change should be within the next several days if it hasn't been done already. Sepsis: likely urinary tract source, abx per primary team, change catheter as recommended. Perinephric hematoma - likely from infection and elevated INR, highly unlikely to be from recurrence of his RCC given that he is so far out from his primary diagnosis and his scans have been negative up until last year. I will reimage him with ultrasound in several months to ensure resolution of his hematoma.  Given the likely etiology of bleed he is at risk for developing a perinephric abscess and would recommend re-imaging sooner if he spikes or develops pain again.  The patient has scheduled follow-up with our office already, due for labs on May 23rd and f/u with me on May 30th.  Please page me for any further questions/issues.

## 2015-06-07 ENCOUNTER — Telehealth: Payer: Self-pay | Admitting: *Deleted

## 2015-06-07 ENCOUNTER — Inpatient Hospital Stay (HOSPITAL_COMMUNITY): Payer: BLUE CROSS/BLUE SHIELD

## 2015-06-07 DIAGNOSIS — I482 Chronic atrial fibrillation, unspecified: Secondary | ICD-10-CM | POA: Diagnosis present

## 2015-06-07 DIAGNOSIS — E1165 Type 2 diabetes mellitus with hyperglycemia: Secondary | ICD-10-CM | POA: Diagnosis present

## 2015-06-07 DIAGNOSIS — I429 Cardiomyopathy, unspecified: Secondary | ICD-10-CM | POA: Diagnosis present

## 2015-06-07 DIAGNOSIS — IMO0002 Reserved for concepts with insufficient information to code with codable children: Secondary | ICD-10-CM | POA: Diagnosis present

## 2015-06-07 DIAGNOSIS — E118 Type 2 diabetes mellitus with unspecified complications: Secondary | ICD-10-CM

## 2015-06-07 DIAGNOSIS — R7881 Bacteremia: Secondary | ICD-10-CM | POA: Insufficient documentation

## 2015-06-07 DIAGNOSIS — N390 Urinary tract infection, site not specified: Secondary | ICD-10-CM

## 2015-06-07 LAB — CBC
HCT: 20.3 % — ABNORMAL LOW (ref 39.0–52.0)
HCT: 25.5 % — ABNORMAL LOW (ref 39.0–52.0)
HEMOGLOBIN: 8.5 g/dL — AB (ref 13.0–17.0)
Hemoglobin: 6.7 g/dL — CL (ref 13.0–17.0)
MCH: 27 pg (ref 26.0–34.0)
MCH: 27.1 pg (ref 26.0–34.0)
MCHC: 33 g/dL (ref 30.0–36.0)
MCHC: 33.3 g/dL (ref 30.0–36.0)
MCV: 81.9 fL (ref 78.0–100.0)
MCV: 81.2 fL (ref 78.0–100.0)
PLATELETS: 204 10*3/uL (ref 150–400)
PLATELETS: 217 10*3/uL (ref 150–400)
RBC: 2.48 MIL/uL — ABNORMAL LOW (ref 4.22–5.81)
RBC: 3.14 MIL/uL — AB (ref 4.22–5.81)
RDW: 16.7 % — AB (ref 11.5–15.5)
RDW: 15.8 % — ABNORMAL HIGH (ref 11.5–15.5)
WBC: 14.4 10*3/uL — AB (ref 4.0–10.5)
WBC: 16.4 10*3/uL — ABNORMAL HIGH (ref 4.0–10.5)

## 2015-06-07 LAB — PREPARE RBC (CROSSMATCH)

## 2015-06-07 LAB — HEMOGLOBIN AND HEMATOCRIT, BLOOD
HCT: 20.6 % — ABNORMAL LOW (ref 39.0–52.0)
Hemoglobin: 6.9 g/dL — CL (ref 13.0–17.0)

## 2015-06-07 LAB — LIPID PANEL
CHOL/HDL RATIO: 5 ratio
Cholesterol: 80 mg/dL (ref 0–200)
HDL: 16 mg/dL — ABNORMAL LOW (ref >40)
LDL CALC: 45 mg/dL (ref 0–99)
TRIGLYCERIDES: 93 mg/dL (ref <150)
VLDL: 19 mg/dL (ref 0–40)

## 2015-06-07 LAB — RENAL FUNCTION PANEL
ALBUMIN: 1.7 g/dL — AB (ref 3.5–5.0)
Anion gap: 11 (ref 5–15)
BUN: 50 mg/dL — AB (ref 6–20)
CHLORIDE: 99 mmol/L — AB (ref 101–111)
CO2: 25 mmol/L (ref 22–32)
CREATININE: 5.17 mg/dL — AB (ref 0.61–1.24)
Calcium: 7.9 mg/dL — ABNORMAL LOW (ref 8.9–10.3)
GFR calc Af Amer: 12 mL/min — ABNORMAL LOW (ref >60)
GFR, EST NON AFRICAN AMERICAN: 10 mL/min — AB (ref >60)
GLUCOSE: 173 mg/dL — AB (ref 65–99)
Phosphorus: 4.7 mg/dL — ABNORMAL HIGH (ref 2.5–4.6)
Potassium: 4 mmol/L (ref 3.5–5.1)
Sodium: 135 mmol/L (ref 135–145)

## 2015-06-07 LAB — CULTURE, BLOOD (ROUTINE X 2)
CULTURE: NO GROWTH
Culture: NO GROWTH

## 2015-06-07 LAB — HEPARIN LEVEL (UNFRACTIONATED)
HEPARIN UNFRACTIONATED: 0.33 [IU]/mL (ref 0.30–0.70)
Heparin Unfractionated: 0.26 IU/mL — ABNORMAL LOW (ref 0.30–0.70)
Heparin Unfractionated: 0.4 IU/mL (ref 0.30–0.70)

## 2015-06-07 LAB — GLUCOSE, CAPILLARY
GLUCOSE-CAPILLARY: 136 mg/dL — AB (ref 65–99)
GLUCOSE-CAPILLARY: 151 mg/dL — AB (ref 65–99)
Glucose-Capillary: 187 mg/dL — ABNORMAL HIGH (ref 65–99)
Glucose-Capillary: 174 mg/dL — ABNORMAL HIGH (ref 65–99)
Glucose-Capillary: 285 mg/dL — ABNORMAL HIGH (ref 65–99)
Glucose-Capillary: 275 mg/dL — ABNORMAL HIGH (ref 65–99)

## 2015-06-07 MED ORDER — SODIUM CHLORIDE 0.9 % IV SOLN
Freq: Once | INTRAVENOUS | Status: AC
  Administered 2015-06-07: 09:00:00 via INTRAVENOUS

## 2015-06-07 MED ORDER — HYDROMORPHONE HCL 1 MG/ML IJ SOLN: 1.0000 mg | INTRAMUSCULAR | Status: DC | PRN

## 2015-06-07 MED ORDER — SODIUM CHLORIDE 0.9 % IV SOLN
Freq: Once | INTRAVENOUS | Status: AC
  Administered 2015-06-08: 03:00:00 via INTRAVENOUS

## 2015-06-07 MED ORDER — INSULIN GLARGINE 100 UNIT/ML ~~LOC~~ SOLN
15.0000 [IU] | Freq: Every day | SUBCUTANEOUS | Status: DC
  Administered 2015-06-08: 15 [IU] via SUBCUTANEOUS
  Filled 2015-06-07 (×2): qty 0.15

## 2015-06-07 MED ORDER — HYDROMORPHONE HCL 1 MG/ML IJ SOLN
1.0000 mg | INTRAMUSCULAR | Status: DC
  Filled 2015-06-07: qty 1

## 2015-06-07 MED ORDER — INSULIN ASPART 100 UNIT/ML ~~LOC~~ SOLN: 10.0000 [IU] | Freq: Three times a day (TID) | SUBCUTANEOUS | Status: DC

## 2015-06-07 MED ORDER — HYDROMORPHONE HCL 1 MG/ML IJ SOLN
1.0000 mg | Freq: Once | INTRAMUSCULAR | Status: AC
  Administered 2015-06-07: 1 mg via INTRAVENOUS
  Filled 2015-06-07: qty 1

## 2015-06-07 MED ORDER — SODIUM CHLORIDE 0.9 % IV BOLUS (SEPSIS)
500.0000 mL | Freq: Once | INTRAVENOUS | Status: AC
  Administered 2015-06-07: 500 mL via INTRAVENOUS

## 2015-06-07 MED ORDER — SODIUM CHLORIDE 0.9 % IV SOLN: Freq: Once | INTRAVENOUS | Status: AC

## 2015-06-07 MED ORDER — OXYCODONE HCL 5 MG PO TABS
5.0000 mg | ORAL_TABLET | ORAL | Status: DC | PRN
  Administered 2015-06-07: 5 mg via ORAL
  Filled 2015-06-07: qty 1

## 2015-06-07 NOTE — Telephone Encounter (Signed)
[id number] Davis,BSN,RN3,CCM,CN: tct-wife [name] inquired about progress of patient and if she had any needs.   Patient is being moved out of the icu onto the renal floor and has stabilized and is doing well.

## 2015-06-07 NOTE — Progress Notes (Signed)
ANTICOAGULATION CONSULT NOTE - Follow Up Consult  Pharmacy Consult for heparin Indication: atrial fibrillation, Hx of pulmonary embolus and DVT  No Known Allergies  Patient Measurements: Height: 5\' 10"  (177.8 cm) Weight: 292 lb 5.3 oz (132.6 kg) IBW/kg (Calculated) : 73 Heparin Dosing Weight: 103.2 kg  Vital Signs: Temp: 98.4 F (36.9 C) (05/02 0731) Temp Source: Oral (05/02 0731) BP: 126/61 mmHg (05/02 0700) Pulse Rate: 67 (05/02 0700)  Labs:  Recent Labs  06/05/15 0313  06/05/15 0500 06/05/15 1030 06/05/15 1710 06/06/15 0210 06/06/15 1615 06/06/15 2345 06/07/15 0530  HGB  --   < > 6.7* 7.7*  --  7.6*  --   --  6.7*  HCT  --   < > 19.7* 22.9*  --  22.3*  --   --  20.3*  PLT  --   < > 189 192  --  206  --   --  204  APTT  --   --  36  --   --  31  --   --   --   LABPROT  --   --  29.1*  --  25.4* 22.9*  --   --   --   INR  --   --  2.81*  --  2.34* 2.04*  --   --   --   HEPARINUNFRC  --   --   --   --   --   --  0.16* 0.26*  --   CREATININE 6.72*  --   --   --   --  7.24*  --   --  5.17*  < > = values in this interval not displayed.  Estimated Creatinine Clearance: 17.4 mL/min (by C-G formula based on Cr of 5.17).   Medications:  Infusions:  . heparin 2,100 Units/hr (06/07/15 0047)    Assessment: 23 yom with afib, hx of DVT/PE on warfarin pta. Pharmacy consulted to dose heparin. INR down to 2.04 and heparin started 5/1 AM when INR likely dropped to <2.  HL therapeutic x1 (0.33) on 2100 units/hr; no heparin with HD. Has perinephric hematoma with plan for ultrasound in several months to ensure resolution. No bleeding noted but has chronic hematuria, no problems with bleeding per RN.   Goal of Therapy:  Heparin level 0.3-0.7 units/ml Monitor platelets by anticoagulation protocol: Yes   Plan:  Increase heparin slightly to 2150 units/h to keep in range 8h HL to confirm, daily HL/CBC Monitor s/sx bleeding - RN to monitor closely with chronic hematuria Hold  warfarin for now  Elicia Lamp, PharmD, [facility] Clinical Pharmacist Pager [phone] 06/07/2015 7:32 AM

## 2015-06-07 NOTE — Progress Notes (Signed)
ANTICOAGULATION CONSULT NOTE   Pharmacy Consult for heparin Indication: H/O AFib/PE/DVT  No Known Allergies  Patient Measurements: Height: 5\' 10"  (177.8 cm) Weight: 298 lb 11.6 oz (135.5 kg) IBW/kg (Calculated) : 73 Heparin Dosing Weight: 103.2 kg  Vital Signs: Temp: 98.6 F (37 C) (05/01 2339) Temp Source: Oral (05/01 2339) BP: 130/64 mmHg (05/01 2200) Pulse Rate: 75 (05/01 2230)  Labs:  Recent Labs  06/04/15 0435 06/05/15 0313 06/05/15 0500 06/05/15 1030 06/05/15 1710 06/06/15 0210 06/06/15 1615 06/06/15 2345  HGB 7.3*  --  6.7* 7.7*  --  7.6*  --   --   HCT 21.8*  --  19.7* 22.9*  --  22.3*  --   --   PLT 193  --  189 192  --  206  --   --   APTT  --   --  36  --   --  31  --   --   LABPROT 26.9*  --  29.1*  --  25.4* 22.9*  --   --   INR 2.53*  --  2.81*  --  2.34* 2.04*  --   --   HEPARINUNFRC  --   --   --   --   --   --  0.16* 0.26*  CREATININE 9.57* 6.72*  --   --   --  7.24*  --   --     Estimated Creatinine Clearance: 12.6 mL/min (by C-G formula based on Cr of 7.24).   Medications:  Infusions:  . heparin 1,950 Units/hr (06/06/15 2108)    Assessment: 75 y.o. male with sepsis, h/o Afib/PE/DVT and Coumadin on hold for heparin Goal of Therapy:  Heparin level 0.3-0.7 units/ml Monitor platelets by anticoagulation protocol: Yes   Plan:  Increase Heparin 2100 units/hr Follow-up am labs.  Phillis Knack, PharmD, BCPS

## 2015-06-07 NOTE — Progress Notes (Addendum)
Shift event: At beginning of shift, RN Charito, paged this NP because pt was having acutely worsening right flank pain. Known right perinephric hematoma presently being watched. Pt says he was pretty much pain free but now pain 10/10.  Stat CT abd/pelvis ordered to reassess hematoma. Dilaudid ordered for pain. CBC ordered. Pt s/p transfusions since admission.  Later, NP to bedside. RN reports soft BP after Dilaudid. Bolus ordered.  S: pt says pain is at right flank and radiates to back. Per wife, he has been pretty much pain free until tonight. She says he wasn't hurting this bad even on admission.  O: Sleepy, but arousable. SBP 74. HR 80s. O2 sat normal on 3L O2 per [place]. Abdomen rounded, soft, generalized tenderness noted with exacerbated tenderness at right flank. No rebound/guarding.  A/P: 1. Acutely worsening abdominal pain in a gentleman with known right perinephric hematoma. Pt being transported to CT. ? Bleeding, but pt is not tachycardic. CBC pending. 2. Afib/hx DVT/PE-on Heparin now-stopped. On Coumadin at home-was hypercoagulable on admission. Rate controlled.  3. Chronic Foley with chronic hematuria, hx prostate cancer with radiation cystitis. + UTI. On abx.  4. Hx cardiomyopathy-TF is Hgb < 8. Hgb now resulted at 6.9. TF 2 U. F/up CT.  Further tx plan depends on clinical response and CT results. Discussed situation with wife.  KJKG, NP [facility] Update: Spoke with PCCM and pt transferred to ICU due to persistent hypotension in spite of IVF boluses. Levophed started no floor prior to TF due to pressure in the 50s. Levophed at 30 and SBP up to 90. CT shows enlarging perinephric hematoma. PCCM to consult and assume pt's care. Wife updated at bedside.  KJKG, NP [facility]

## 2015-06-07 NOTE — Progress Notes (Deleted)
Assessment/ Plan: Pt is a 75 y.o. yo male who was admitted on 05/31/2015 with a PMH significant for prostate CA, RCC s/p left nephrectomy, A Fib, h/o DVT/PE (on coumadin), obesity, and long-term urinary catheterization w/ occasional hematuria. Patient was admitted to the ICU for septic shock 2/2. Patient's Cr was notably elevated at 9.33, his baseline is thought to be around 1.7-1.9. PT/INR was elevated.   1. AKI/CKD, patient s/p left nephrectomy: DDx includes ischemic ATN vs obstructive. Not on ACEi or NSAIDs.  HD prn 2. Positive blood culture pan-sensitive Kleb Pneumo 3. Hematologic: Acute blood loss anemia? -- perinephric hematoma present on CT Give PRBCs 4. Urosepsis: On Ceftriaxone.  5. Retroperitoneal, perinephric hematoma with displacement of right kidney: No evidence of hydro. No heparin with HD. Urology following, recommends reimaging in about 4 mths.  Subjective: Interval History: oliguric with prob increase UOP; tol HD yesterday  Objective: Vital signs in last 24 hours: Temp:  [97.8 F (36.6 C)-98.9 F (37.2 C)] 98.4 F (36.9 C) (05/02 0731) Pulse Rate:  [58-82] 67 (05/02 0700) Resp:  [13-26] 19 (05/02 0700) BP: (110-164)/(58-94) 126/61 mmHg (05/02 0700) SpO2:  [96 %-100 %] 98 % (05/02 0700) Weight:  [132.6 kg (292 lb 5.3 oz)-135.5 kg (298 lb 11.6 oz)] 132.6 kg (292 lb 5.3 oz) (05/02 0500) Weight change: 0.4 kg (14.1 oz)  Intake/Output from previous day: 05/01 0701 - 05/02 0700 In: 257 [I.V.:207; IV Piggyback:50] Out: 1275 [Urine:275] Intake/Output this shift:    General appearance: alert and cooperative Resp: clear to auscultation bilaterally GI: tender right abdomen  Right IJ Cath  Lab Results:  Recent Labs  06/06/15 0210 06/07/15 0530  WBC 17.8* 14.4*  HGB 7.6* 6.7*  HCT 22.3* 20.3*  PLT 206 204   BMET:  Recent Labs  06/06/15 0210 06/07/15 0530  NA 133* 135  K 4.2 4.0  CL 98* 99*  CO2 22 25  GLUCOSE 226* 173*  BUN 79* 50*  CREATININE 7.24*  5.17*  CALCIUM 7.9* 7.9*   No results for input(s): PTH in the last 72 hours. Iron Studies: No results for input(s): IRON, TIBC, TRANSFERRIN, FERRITIN in the last 72 hours. Studies/Results: No results found.  Scheduled: . sodium chloride   Intravenous Once  . sodium chloride   Intravenous Once  . carvedilol  6.25 mg Oral BID WC  . cefTRIAXone (ROCEPHIN)  IV  2 g Intravenous Q24H  . diltiazem  60 mg Oral Q8H  . diphenhydrAMINE  25 mg Oral Once  . insulin aspart  0-20 Units Subcutaneous TID WC  . insulin aspart  0-5 Units Subcutaneous QHS  . insulin aspart  6 Units Subcutaneous TID WC  . insulin glargine  10 Units Subcutaneous QHS  . pantoprazole  40 mg Oral Daily     LOS: 7 days   Sadiya Durand C 06/07/2015,8:49 AM

## 2015-06-07 NOTE — Progress Notes (Signed)
Called CT again for update status on patient's CT,per staff,they're still dealing with trauma.Patient still in a lot of pain,moaning.BP 70/51 HR 89.Text paged MD on call. Rapid response RN notified also. Treacy Holcomb, Wonda Cheng, Therapist, sports

## 2015-06-07 NOTE — Progress Notes (Signed)
Patient c/o severe pain 10/10 on his back radiating to his rt side. Per patient,he had some pain on his back when he first came in but not this severe.MD on call text paged.Also notified rapid response RN.Will continue to monitor. [name], Wonda Cheng, Therapist, sports

## 2015-06-07 NOTE — Progress Notes (Addendum)
Assessment/ Plan: Pt is a 75 y.o. yo male who was admitted on 05/31/2015 with a PMH significant for prostate CA, RCC s/p left nephrectomy, A Fib, h/o DVT/PE (on coumadin), obesity, and long-term urinary catheterization w/ occasional hematuria. Patient was admitted to the ICU for septic shock 2/2. Patient's Cr was notably elevated at 9.33, his baseline is thought to be around 1.7-1.9. PT/INR was elevated.   1. AKI/CKD, patient s/p left nephrectomy: DDx includes ischemic ATN vs obstructive. Not on ACEi or NSAIDs. UOP 227mL over the last 24 hours. Cr improving.   - s/p HD yesterday.  2. Urosepsis / Positive blood culture pan-sensitive Kleb Pneumo. abx per primary team.  3. Hematologic: Acute blood loss anemia? -- perinephric hematoma present on CT. Will give 1U pRBC. Check CBC in AM.   4. Retroperitoneal, perinephric hematoma with displacement of right kidney: No evidence of hydro. No heparin with HD. Urology following, recommends reimaging in about 4 mths.  Subjective: Feels better this morning. No acute complaints.   Objective: Vital signs in last 24 hours: Temp:  [97.8 F (36.6 C)-98.9 F (37.2 C)] 98.4 F (36.9 C) (05/02 0731) Pulse Rate:  [58-82] 67 (05/02 0700) Resp:  [13-26] 19 (05/02 0700) BP: (110-164)/(58-94) 126/61 mmHg (05/02 0700) SpO2:  [96 %-100 %] 98 % (05/02 0700) Weight:  [292 lb 5.3 oz (132.6 kg)-298 lb 11.6 oz (135.5 kg)] 292 lb 5.3 oz (132.6 kg) (05/02 0500) Weight change: 14.1 oz (0.4 kg)  Intake/Output from previous day: 05/01 0701 - 05/02 0700 In: 257 [I.V.:207; IV Piggyback:50] Out: 1275 [Urine:275] Intake/Output this shift:    General appearance: alert and cooperative Resp: CTA Chest wall: no tenderness Cardio: RRR GI: S, NT, ND  Lab Results:  Recent Labs  06/06/15 0210 06/07/15 0530  WBC 17.8* 14.4*  HGB 7.6* 6.7*  HCT 22.3* 20.3*  PLT 206 204   BMET:   Recent Labs  06/06/15 0210 06/07/15 0530  NA 133* 135  K 4.2 4.0  CL 98* 99*  CO2  22 25  GLUCOSE 226* 173*  BUN 79* 50*  CREATININE 7.24* 5.17*  CALCIUM 7.9* 7.9*   No results for input(s): PTH in the last 72 hours. Iron Studies: No results for input(s): IRON, TIBC, TRANSFERRIN, FERRITIN in the last 72 hours. Studies/Results: No results found.  Scheduled: . sodium chloride   Intravenous Once  . carvedilol  6.25 mg Oral BID WC  . cefTRIAXone (ROCEPHIN)  IV  2 g Intravenous Q24H  . diltiazem  60 mg Oral Q8H  . diphenhydrAMINE  25 mg Oral Once  . insulin aspart  0-20 Units Subcutaneous TID WC  . insulin aspart  0-5 Units Subcutaneous QHS  . insulin aspart  6 Units Subcutaneous TID WC  . insulin glargine  10 Units Subcutaneous QHS  . pantoprazole  40 mg Oral Daily    LOS: 7 days   Dimas Chyle 06/07/2015,8:36 AM   Renal Attending: I agree with note as articulated above. Sandford Diop C

## 2015-06-07 NOTE — Progress Notes (Signed)
[facility] TEAM 1 - Stepdown/ICU TEAM Progress Note  KASYN BRAU [id number] DOB: 1940-06-14 DOA: 05/31/2015 PCP: Melinda Crutch  Admit HPI / Brief Narrative: 75 y.o. WM PMHx Cardiomyopathy, Prostate CA s/p radiation 2011 and RCC s/p left nephrectomy 1999   who was seen in ED 4/22 for clogged urinary catheter which was irrigated and replaced. 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 He had been in his USOH until roughly 4/20 when he had decreased appetite. He had not been eating or drinking for 5 days. On 04/25 he went to have his INR checked (on coumadin for A.fib and hx DVT / PE) and was found to have INR of 8 and was hypotensive with SBP 70/44. He was sent to the ED for further evaluation.  In the ED, he was found to have UTI and despite 4.5L IVF, he remained hypotensive. He also had multiple metabolic derangements including Na 124, K 5.0, AGMA, lactate 3, SCr 9.36. PCCM was consulted for admission w/ septic shock due to urosepsis as well as AKI.   HPI/Subjective: 5/2  A/O 4 states has never had previous HD. Currently negative N/V, negative SOB, negative CP. States bilateral pedal edema secondary to not wearing TED hose while hospitalized. States never had SSx of GI bleed only hematuria.   Assessment/Plan:  Septic Shock and Bacteremia due to Klebsiella Pyelonephritis  -Continue directed abx tx - shock and sepsis physiology resolved - will needed extended course of abx given bacteremia - re-image perinephric hematoma if develops fevers or other signs of persisting infxn  Acute Renal Failure (hx L nephrectomy 1999) Nephrology following - UOP still scant - requiring HD - etiology unclear  Lab Results  Component Value Date   CREATININE 5.17* 06/07/2015   CREATININE 7.24* 06/06/2015   CREATININE 6.72* 06/05/2015   Cardiomyopathy -Transfuse for hemoglobin<8 -5/2 transfuse 2 units PRBC  Chronic A.fib -Rate controlled   - IV heparin anticoag   HTN  -Coreg 6.25 mg BID -Cardizem 60 mg  TID   Coagulopathy -Pt is s/p 6U total FFP since admit, and has still never been fully corrected - dosed w/ vitamin K 4/30 - given high risk of clotting event (Afib + hx PE) will still cover w/ heparin IV  - given marked uremia dosed w/ ddavp to improve Plt clotting function   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  -foley changed monthly (last done 05/11/15)  Chronic hematuria  -Very minimal urinary output presently - hematuria persists - hx of radiation cystitis -Picking up on 5/2 -Strict in and out since admission +7.1 L -Daily weight Filed Weights   06/06/15 1416 06/06/15 1802 06/07/15 0500  Weight: 132.8 kg (292 lb 12.3 oz) 135.5 kg (298 lb 11.6 oz) 132.6 kg (292 lb 5.3 oz)    Acute respiratory failure with hypoxia  Resolved  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  Hx prostate CA s/p radiation 2011  RCC s/p left nephrectomy 1999  DM Type 2 uncontrolled with complication -CBG still poorly controlled -Hemoglobin A1c pending -Lipid panel pending -Increase Lantus 15 units -Increase NovoLog 10 units QAC -Continue resistant SSI  HLD See DM   Code Status: FULL Family Communication: no family present at time of  exam Disposition Plan: Resolution acute renal failure    Consultants: PCCM Nephrology Urology   Procedure/Significant Events: Transfused 6U FFP 4/25 admit with septic shock due to UTI + ARF 5/2 transfuse 2 units PRBC  Culture 4/25 MRSA by PCR negative 4/25 blood right hand/AC positive Klebsiella pneumoniae 4/27 blood right hand/AC negative final   Antibiotics: Ceftaz 04/25 > Vanc 04/25 > 04/25  DVT prophylaxis: IV  heparin   Devices    LINES / TUBES:  Right IJ Vas-Cath    Continuous Infusions: . heparin 2,150 Units/hr (06/07/15 1013)    Objective: VITAL SIGNS: Temp: 97.8 F (36.6 C) (05/02 1615) Temp Source: Oral (05/02 1615) BP: 156/75 mmHg (05/02 1445) Pulse Rate: 57 (05/02 1700) SPO2; FIO2:   Intake/Output Summary (Last 24 hours) at 06/07/15 1834 Last data filed at 06/07/15 1700  Gross per 24 hour  Intake 1001.5 ml  Output    575 ml  Net  426.5 ml     Exam: General: A/O 4, NAD, No acute respiratory distress Eyes: negative scleral hemorrhage, negative icterus ENT: Negative Runny nose, negative gingival bleeding, Neck:  Negative scars, masses, torticollis, lymphadenopathy, JVD Lungs: Clear to auscultation bilaterally without wheezes or crackles Cardiovascular: Irregular irregular rhythm and rate, negative normal S1 and S2, negative gallops, rubs Abdomen:negative abdominal pain, nondistended, positive soft, bowel sounds, no rebound, no ascites, no appreciable mass Extremities: bilateral pedal edema 3+ (per patient wears TED hose daily which reduces), negative cyanosis, clubbing, +1 DP/PT bilateral Psychiatric:  Negative depression, negative anxiety, negative fatigue, negative mania  Neurologic:  Cranial nerves II through XII intact, tongue/uvula midline, all extremities muscle strength 5/5, sensation intact throughout, negative dysarthria, negative expressive aphasia, negative receptive aphasia.   Data Reviewed: Basic Metabolic Panel:  Recent Labs Lab 06/01/15 0218  06/03/15 0217 06/04/15 0435 06/05/15 0313 06/06/15 0210 06/07/15 0530  NA 129*  < > 133* 135 133* 133* 135  K 4.3  < > 3.7 4.0 3.7 4.2 4.0  CL 100*  < > 97* 100* 100* 98* 99*  CO2 13*  < > 21* 21* 22 22 25   GLUCOSE 194*  < > 210* 152* 256* 226* 173*  BUN 122*  < > 93* 103* 66* 79* 50*  CREATININE 9.33*  < > 8.96* 9.57* 6.72* 7.24* 5.17*  CALCIUM 7.2*  < > 7.3* 7.5* 7.6* 7.9* 7.9*  MG 2.1  --   --    --   --   --   --   PHOS 5.1*  --  4.2 4.5 4.8* 5.3* 4.7*  < > = values in this interval not displayed. Liver Function Tests:  Recent Labs Lab 06/01/15 1043 06/03/15 0217 06/04/15 0435 06/05/15 0313 06/06/15 0210 06/07/15 0530  AST  --   --   --   --  18  --   ALT  --   --   --   --  15*  --   ALKPHOS  --   --   --   --  64  --   BILITOT 1.5*  --   --   --  0.6  --   PROT  --   --   --   --  5.2*  --   ALBUMIN  --  1.7* 1.9* 1.7* 1.9*  1.8* 1.7*   No results for input(s): LIPASE, AMYLASE in the last 168 hours. No results for input(s): AMMONIA in the last 168 hours. CBC:  Recent Labs Lab 06/02/15 0217  06/05/15 0500 06/05/15 1030  06/06/15 0210 06/07/15 0530 06/07/15 1715  WBC 12.0*  < > 15.0* 17.1* 17.8* 14.4* 16.4*  NEUTROABS 10.2*  --   --   --   --   --   --   HGB 10.3*  < > 6.7* 7.7* 7.6* 6.7* 8.5*  HCT 30.2*  < > 19.7* 22.9* 22.3* 20.3* 25.5*  MCV 80.3  < > 82.4 82.4 81.7 81.9 81.2  PLT 132*  < > 189 192 206 204 217  < > = values in this interval not displayed. Cardiac Enzymes: No results for input(s): CKTOTAL, CKMB, CKMBINDEX, TROPONINI in the last 168 hours. BNP (last 3 results) No results for input(s): BNP in the last 8760 hours.  ProBNP (last 3 results) No results for input(s): PROBNP in the last 8760 hours.  CBG:  Recent Labs Lab 06/06/15 1929 06/06/15 2216 06/07/15 0728 06/07/15 1144 06/07/15 1812  GLUCAP 136* 187* 174* 285* 151*    Recent Results (from the past 240 hour(s))  Blood culture (routine x 2)     Status: Abnormal   Collection Time: 05/31/15 10:55 AM  Result Value Ref Range Status   Specimen Description BLOOD RIGHT HAND  Final   Special Requests BOTTLES DRAWN AEROBIC AND ANAEROBIC 5CC  Final   Culture  Setup Time   Final    GRAM NEGATIVE RODS IN BOTH AEROBIC AND ANAEROBIC BOTTLES CRITICAL RESULT CALLED TO, READ BACK BY AND VERIFIED WITH: Archie Balboa RN [id number] 05/31/15 A BROWNING    Culture KLEBSIELLA PNEUMONIAE (A)  Final   Report  Status 06/04/2015 FINAL  Final   Organism ID, Bacteria KLEBSIELLA PNEUMONIAE  Final      Susceptibility   Klebsiella pneumoniae - MIC*    AMPICILLIN 16 RESISTANT Resistant     CEFAZOLIN <=4 SENSITIVE Sensitive     CEFEPIME <=1 SENSITIVE Sensitive     CEFTAZIDIME <=1 SENSITIVE Sensitive     CEFTRIAXONE <=1 SENSITIVE Sensitive     CIPROFLOXACIN <=0.25 SENSITIVE Sensitive     GENTAMICIN <=1 SENSITIVE Sensitive     IMIPENEM <=0.25 SENSITIVE Sensitive     TRIMETH/SULFA <=20 SENSITIVE Sensitive     AMPICILLIN/SULBACTAM <=2 SENSITIVE Sensitive     PIP/TAZO <=4 SENSITIVE Sensitive     * KLEBSIELLA PNEUMONIAE  Blood culture (routine x 2)     Status: Abnormal   Collection Time: 05/31/15 11:35 AM  Result Value Ref Range Status   Specimen Description BLOOD RIGHT HAND  Final   Special Requests BOTTLES DRAWN AEROBIC ONLY 5CC  Final   Culture  Setup Time   Final    GRAM NEGATIVE RODS AEROBIC BOTTLE ONLY CRITICAL RESULT CALLED TO, READ BACK BY AND VERIFIED WITH: Renette Butters @0026  06/01/15 MKELLY    Culture (A)  Final    KLEBSIELLA PNEUMONIAE SUSCEPTIBILITIES PERFORMED ON PREVIOUS CULTURE WITHIN THE LAST 5 DAYS.    Report Status 06/04/2015 FINAL  Final  Urine culture     Status: Abnormal   Collection Time: 05/31/15 11:58 AM  Result Value Ref Range Status   Specimen Description URINE, RANDOM  Final   Special Requests NONE  Final   Culture MULTIPLE SPECIES PRESENT, SUGGEST RECOLLECTION (A)  Final   Report Status 06/01/2015 FINAL  Final  MRSA PCR Screening     Status: None   Collection Time: 05/31/15  3:44 PM  Result Value Ref Range Status   MRSA by PCR NEGATIVE NEGATIVE Final    Comment:        The GeneXpert MRSA Assay (FDA approved  for NASAL specimens only), is one component of a comprehensive MRSA colonization surveillance program. It is not intended to diagnose MRSA infection nor to guide or monitor treatment for MRSA infections.   Culture, blood (routine x 2)     Status: Abnormal    Collection Time: 05/31/15  4:05 PM  Result Value Ref Range Status   Specimen Description BLOOD RIGHT HAND  Final   Special Requests IN PEDIATRIC BOTTLE 2CC  Final   Culture  Setup Time   Final    GRAM NEGATIVE RODS AEROBIC BOTTLE ONLY CRITICAL RESULT CALLED TO, READ BACK BY AND VERIFIED WITH: Renette Butters @0617  06/01/15 MKELLY    Culture (A)  Final    KLEBSIELLA PNEUMONIAE SUSCEPTIBILITIES PERFORMED ON PREVIOUS CULTURE WITHIN THE LAST 5 DAYS.    Report Status 06/04/2015 FINAL  Final  Culture, blood (routine x 2)     Status: Abnormal   Collection Time: 05/31/15  4:15 PM  Result Value Ref Range Status   Specimen Description BLOOD RIGHT ANTECUBITAL  Final   Special Requests IN PEDIATRIC BOTTLE 3CC  Final   Culture  Setup Time   Final    GRAM NEGATIVE RODS AEROBIC BOTTLE ONLY CRITICAL RESULT CALLED TO, READ BACK BY AND VERIFIED WITH: Ferrel Logan ,RN @ [phone] 06/01/15 MKELLY    Culture (A)  Final    KLEBSIELLA PNEUMONIAE SUSCEPTIBILITIES PERFORMED ON PREVIOUS CULTURE WITHIN THE LAST 5 DAYS.    Report Status 06/04/2015 FINAL  Final  Culture, blood (routine x 2)     Status: None   Collection Time: 06/02/15 12:25 AM  Result Value Ref Range Status   Specimen Description BLOOD RIGHT HAND  Final   Special Requests BOTTLES DRAWN AEROBIC AND ANAEROBIC 5CC   Final   Culture NO GROWTH 5 DAYS  Final   Report Status 06/07/2015 FINAL  Final  Culture, blood (routine x 2)     Status: None   Collection Time: 06/02/15 12:25 AM  Result Value Ref Range Status   Specimen Description BLOOD RIGHT ANTECUBITAL  Final   Special Requests BOTTLES DRAWN AEROBIC AND ANAEROBIC 5CC   Final   Culture NO GROWTH 5 DAYS  Final   Report Status 06/07/2015 FINAL  Final     Studies:  Recent x-ray studies have been reviewed in detail by the Attending Physician  Scheduled Meds:  Scheduled Meds: . carvedilol  6.25 mg Oral BID WC  . cefTRIAXone (ROCEPHIN)  IV  2 g Intravenous Q24H  . diltiazem  60 mg Oral Q8H  .  diphenhydrAMINE  25 mg Oral Once  . insulin aspart  0-20 Units Subcutaneous TID WC  . insulin aspart  0-5 Units Subcutaneous QHS  . insulin aspart  10 Units Subcutaneous TID WC  . insulin glargine  15 Units Subcutaneous QHS  . pantoprazole  40 mg Oral Daily    Time spent on care of this patient: 40 mins   Kiing Deakin, Geraldo Docker , MD  [facility] Office  [phone] Pager [phone]  On-Call/Text Page:      [email or web address]      password [email or web address]  If 7PM-7AM, please contact night-coverage [email or web address] Password [facility] 06/07/2015, 6:34 PM   LOS: 7 days   Care during the described time interval was provided by me .  I have reviewed this patient's available data, including medical history, events of note, physical examination, and all test results as part of my evaluation. I have personally reviewed and interpreted all radiology studies.   Dia Crawford, MD  [phone] Pager

## 2015-06-07 NOTE — Progress Notes (Signed)
Pain medicine given at 20:44 for back pain/rt side pain with some relief.Patient is now again complaining that pain is back and severe.Order is dilaudid 1 mg Q3H PRN. Tylene Fantasia text paged. Also checked with CT when they can get the patient,per staff,they have a code stroke and trauma and will be awhile.Wife at bedside and updated on CT status.Will continue to monitor patient. Rudy Domek, Wonda Cheng, Therapist, sports

## 2015-06-07 NOTE — Progress Notes (Signed)
ANTICOAGULATION CONSULT NOTE - Follow Up Consult  Pharmacy Consult for heparin Indication: atrial fibrillation, Hx of pulmonary embolus and DVT  No Known Allergies  Patient Measurements: Height: 5\' 10"  (177.8 cm) Weight: 292 lb 5.3 oz (132.6 kg) IBW/kg (Calculated) : 73 Heparin Dosing Weight: 103.2 kg  Vital Signs: Temp: 97.8 F (36.6 C) (05/02 1615) Temp Source: Oral (05/02 1615) BP: 156/75 mmHg (05/02 1445) Pulse Rate: 57 (05/02 1700)  Labs:  Recent Labs  06/05/15 0313 06/05/15 0500  06/05/15 1710 06/06/15 0210  06/06/15 2345 06/07/15 0530 06/07/15 0915 06/07/15 1715  HGB  --  6.7*  < >  --  7.6*  --   --  6.7*  --  8.5*  HCT  --  19.7*  < >  --  22.3*  --   --  20.3*  --  25.5*  PLT  --  189  < >  --  206  --   --  204  --  217  APTT  --  36  --   --  31  --   --   --   --   --   LABPROT  --  29.1*  --  25.4* 22.9*  --   --   --   --   --   INR  --  2.81*  --  2.34* 2.04*  --   --   --   --   --   HEPARINUNFRC  --   --   --   --   --   < > 0.26*  --  0.33 0.40  CREATININE 6.72*  --   --   --  7.24*  --   --  5.17*  --   --   < > = values in this interval not displayed.  Estimated Creatinine Clearance: 17.4 mL/min (by C-G formula based on Cr of 5.17).   Medications:  Infusions:  . heparin 2,150 Units/hr (06/07/15 1013)    Assessment: 45 yom with afib, hx of DVT/PE on warfarin pta. Pharmacy consulted to dose heparin. INR down to 2.04 and heparin started 5/1 AM when INR likely dropped to <2.  HL therapeutic x1 (0.4) on 2150 units/hr; no heparin with HD. Has perinephric hematoma with plan for ultrasound in several months to ensure resolution. No bleeding noted but has chronic hematuria, no problems with bleeding per [place], RN on 6E but patient just transferred to unit. Instructed her to monitor and call pharmacy with any issues. Transfused for Hgb 6.7 this AM- repeat Hgb now up to 8.5.  Goal of Therapy:  Heparin level 0.3-0.7 units/ml Monitor platelets by  anticoagulation protocol: Yes   Plan:  Continue heparin slightly to 2150 units/h to keep in range  daily HL/CBC Monitor s/sx bleeding - RN to monitor closely with chronic hematuria and call pharmacy if any concerns.  Hold warfarin for now  Sloan Leiter, PharmD, BCPS Clinical Pharmacist [phone]  06/07/2015 6:03 PM

## 2015-06-08 ENCOUNTER — Inpatient Hospital Stay (HOSPITAL_COMMUNITY): Payer: BLUE CROSS/BLUE SHIELD

## 2015-06-08 ENCOUNTER — Telehealth: Payer: Self-pay | Admitting: *Deleted

## 2015-06-08 ENCOUNTER — Encounter (HOSPITAL_COMMUNITY): Payer: Self-pay | Admitting: Radiology

## 2015-06-08 DIAGNOSIS — R109 Unspecified abdominal pain: Secondary | ICD-10-CM | POA: Insufficient documentation

## 2015-06-08 DIAGNOSIS — S37019A Minor contusion of unspecified kidney, initial encounter: Secondary | ICD-10-CM | POA: Diagnosis present

## 2015-06-08 DIAGNOSIS — R10A1 Flank pain, right side: Secondary | ICD-10-CM | POA: Insufficient documentation

## 2015-06-08 DIAGNOSIS — J9600 Acute respiratory failure, unspecified whether with hypoxia or hypercapnia: Secondary | ICD-10-CM

## 2015-06-08 LAB — CBC WITH DIFFERENTIAL/PLATELET
BASOS ABS: 0 10*3/uL (ref 0.0–0.1)
BASOS PCT: 0 %
Band Neutrophils: 15 %
Basophils Absolute: 0 10*3/uL (ref 0.0–0.1)
Basophils Relative: 0 %
Blasts: 0 %
EOS PCT: 0 %
Eosinophils Absolute: 0 10*3/uL (ref 0.0–0.7)
Eosinophils Absolute: 0 10*3/uL (ref 0.0–0.7)
Eosinophils Relative: 0 %
HEMATOCRIT: 21.6 % — AB (ref 39.0–52.0)
HEMATOCRIT: 27.5 % — AB (ref 39.0–52.0)
HEMOGLOBIN: 9.7 g/dL — AB (ref 13.0–17.0)
Hemoglobin: 7.1 g/dL — ABNORMAL LOW (ref 13.0–17.0)
LYMPHS ABS: 1 10*3/uL (ref 0.7–4.0)
LYMPHS ABS: 1.2 10*3/uL (ref 0.7–4.0)
LYMPHS PCT: 2 %
Lymphocytes Relative: 2 %
MCH: 28.7 pg (ref 26.0–34.0)
MCH: 30.1 pg (ref 26.0–34.0)
MCHC: 32.9 g/dL (ref 30.0–36.0)
MCHC: 35.3 g/dL (ref 30.0–36.0)
MCV: 87.4 fL (ref 78.0–100.0)
MCV: 85.4 fL (ref 78.0–100.0)
MONO ABS: 1 10*3/uL (ref 0.1–1.0)
MONO ABS: 4.1 10*3/uL — AB (ref 0.1–1.0)
Metamyelocytes Relative: 2 %
Monocytes Relative: 2 %
Monocytes Relative: 7 %
Myelocytes: 0 %
NEUTROS ABS: 46.8 10*3/uL — AB (ref 1.7–7.7)
NEUTROS ABS: 53.2 10*3/uL — AB (ref 1.7–7.7)
NEUTROS PCT: 79 %
NRBC: 0 /100{WBCs}
Neutrophils Relative %: 91 %
OTHER: 0 %
Platelets: 164 10*3/uL (ref 150–400)
Platelets: 158 10*3/uL (ref 150–400)
Promyelocytes Absolute: 0 %
RBC: 2.47 MIL/uL — ABNORMAL LOW (ref 4.22–5.81)
RBC: 3.22 MIL/uL — ABNORMAL LOW (ref 4.22–5.81)
RDW: 15.4 % (ref 11.5–15.5)
RDW: 15 % (ref 11.5–15.5)
WBC: 48.8 10*3/uL — ABNORMAL HIGH (ref 4.0–10.5)
WBC: 58.5 10*3/uL — AB (ref 4.0–10.5)

## 2015-06-08 LAB — CBC
HCT: 23.5 % — ABNORMAL LOW (ref 39.0–52.0)
Hemoglobin: 7.9 g/dL — ABNORMAL LOW (ref 13.0–17.0)
MCH: 28.1 pg (ref 26.0–34.0)
MCHC: 33.6 g/dL (ref 30.0–36.0)
MCV: 83.6 fL (ref 78.0–100.0)
PLATELETS: 268 10*3/uL (ref 150–400)
RBC: 2.81 MIL/uL — AB (ref 4.22–5.81)
RDW: 15.3 % (ref 11.5–15.5)
WBC: 45.8 10*3/uL — AB (ref 4.0–10.5)

## 2015-06-08 LAB — GLUCOSE, CAPILLARY
GLUCOSE-CAPILLARY: 175 mg/dL — AB (ref 65–99)
GLUCOSE-CAPILLARY: 181 mg/dL — AB (ref 65–99)
Glucose-Capillary: 246 mg/dL — ABNORMAL HIGH (ref 65–99)
Glucose-Capillary: 195 mg/dL — ABNORMAL HIGH (ref 65–99)
Glucose-Capillary: 225 mg/dL — ABNORMAL HIGH (ref 65–99)

## 2015-06-08 LAB — BASIC METABOLIC PANEL
ANION GAP: 24 — AB (ref 5–15)
Anion gap: 20 — ABNORMAL HIGH (ref 5–15)
BUN: 65 mg/dL — ABNORMAL HIGH (ref 6–20)
BUN: 64 mg/dL — ABNORMAL HIGH (ref 6–20)
CALCIUM: 7.6 mg/dL — AB (ref 8.9–10.3)
CHLORIDE: 100 mmol/L — AB (ref 101–111)
CHLORIDE: 102 mmol/L (ref 101–111)
CO2: 14 mmol/L — ABNORMAL LOW (ref 22–32)
CO2: 12 mmol/L — AB (ref 22–32)
CREATININE: 6.41 mg/dL — AB (ref 0.61–1.24)
CREATININE: 6.14 mg/dL — AB (ref 0.61–1.24)
Calcium: 7 mg/dL — ABNORMAL LOW (ref 8.9–10.3)
GFR calc non Af Amer: 8 mL/min — ABNORMAL LOW (ref >60)
GFR, EST AFRICAN AMERICAN: 9 mL/min — AB (ref >60)
GFR, EST AFRICAN AMERICAN: 9 mL/min — AB (ref >60)
GFR, EST NON AFRICAN AMERICAN: 8 mL/min — AB (ref >60)
GLUCOSE: 205 mg/dL — AB (ref 65–99)
Glucose, Bld: 243 mg/dL — ABNORMAL HIGH (ref 65–99)
Potassium: 6 mmol/L — ABNORMAL HIGH (ref 3.5–5.1)
Potassium: 5.3 mmol/L — ABNORMAL HIGH (ref 3.5–5.1)
SODIUM: 134 mmol/L — AB (ref 135–145)
Sodium: 138 mmol/L (ref 135–145)

## 2015-06-08 LAB — POCT I-STAT 3, ART BLOOD GAS (G3+)
ACID-BASE DEFICIT: 20 mmol/L — AB (ref 0.0–2.0)
ACID-BASE DEFICIT: 20 mmol/L — AB (ref 0.0–2.0)
ACID-BASE DEFICIT: 14 mmol/L — AB (ref 0.0–2.0)
ACID-BASE DEFICIT: 8 mmol/L — AB (ref 0.0–2.0)
Acid-base deficit: 7 mmol/L — ABNORMAL HIGH (ref 0.0–2.0)
BICARBONATE: 9.3 meq/L — AB (ref 20.0–24.0)
BICARBONATE: 13.8 meq/L — AB (ref 20.0–24.0)
BICARBONATE: 14.7 meq/L — AB (ref 20.0–24.0)
Bicarbonate: 9.5 mEq/L — ABNORMAL LOW (ref 20.0–24.0)
Bicarbonate: 16.5 mEq/L — ABNORMAL LOW (ref 20.0–24.0)
O2 SAT: 91 %
O2 SAT: 99 %
O2 SAT: 100 %
O2 SAT: 100 %
O2 Saturation: 99 %
PCO2 ART: 24.8 mmHg — AB (ref 35.0–45.0)
PH ART: 6.998 — AB (ref 7.350–7.450)
PH ART: 7.152 — AB (ref 7.350–7.450)
PH ART: 7.444 (ref 7.350–7.450)
PO2 ART: 227 mmHg — AB (ref 80.0–100.0)
PO2 ART: 277 mmHg — AB (ref 80.0–100.0)
Patient temperature: 98.4
Patient temperature: 94.2
TCO2: 10 mmol/L (ref 0–100)
TCO2: 11 mmol/L (ref 0–100)
TCO2: 15 mmol/L (ref 0–100)
TCO2: 17 mmol/L (ref 0–100)
TCO2: 15 mmol/L (ref 0–100)
pCO2 arterial: 37.8 mmHg (ref 35.0–45.0)
pCO2 arterial: 39.4 mmHg (ref 35.0–45.0)
pCO2 arterial: 39.5 mmHg (ref 35.0–45.0)
pCO2 arterial: 20.9 mmHg — ABNORMAL LOW (ref 35.0–45.0)
pH, Arterial: 6.992 — CL (ref 7.350–7.450)
pH, Arterial: 7.428 (ref 7.350–7.450)
pO2, Arterial: 190 mmHg — ABNORMAL HIGH (ref 80.0–100.0)
pO2, Arterial: 92 mmHg (ref 80.0–100.0)
pO2, Arterial: 131 mmHg — ABNORMAL HIGH (ref 80.0–100.0)

## 2015-06-08 LAB — RENAL FUNCTION PANEL
Albumin: 2 g/dL — ABNORMAL LOW (ref 3.5–5.0)
Anion gap: 25 — ABNORMAL HIGH (ref 5–15)
BUN: 54 mg/dL — AB (ref 6–20)
CHLORIDE: 100 mmol/L — AB (ref 101–111)
CO2: 13 mmol/L — ABNORMAL LOW (ref 22–32)
CREATININE: 5.13 mg/dL — AB (ref 0.61–1.24)
Calcium: 7.1 mg/dL — ABNORMAL LOW (ref 8.9–10.3)
GFR calc Af Amer: 12 mL/min — ABNORMAL LOW (ref >60)
GFR calc non Af Amer: 10 mL/min — ABNORMAL LOW (ref >60)
GLUCOSE: 233 mg/dL — AB (ref 65–99)
POTASSIUM: 4.7 mmol/L (ref 3.5–5.1)
Phosphorus: 7.9 mg/dL — ABNORMAL HIGH (ref 2.5–4.6)
Sodium: 138 mmol/L (ref 135–145)

## 2015-06-08 LAB — PREPARE RBC (CROSSMATCH)

## 2015-06-08 LAB — PROTIME-INR
INR: 2.8 — AB (ref 0.00–1.49)
INR: 3.43 — ABNORMAL HIGH (ref 0.00–1.49)
INR: 2.77 — AB (ref 0.00–1.49)
PROTHROMBIN TIME: 29.1 s — AB (ref 11.6–15.2)
PROTHROMBIN TIME: 33.9 s — AB (ref 11.6–15.2)
Prothrombin Time: 28.9 seconds — ABNORMAL HIGH (ref 11.6–15.2)

## 2015-06-08 LAB — FIBRINOGEN: Fibrinogen: 558 mg/dL — ABNORMAL HIGH (ref 204–475)

## 2015-06-08 LAB — TROPONIN I
TROPONIN I: 0.08 ng/mL — AB (ref <0.031)
TROPONIN I: 0.32 ng/mL — AB (ref <0.031)
TROPONIN I: 0.27 ng/mL — AB (ref <0.031)

## 2015-06-08 LAB — APTT
APTT: 33 s (ref 24–37)
aPTT: 35 seconds (ref 24–37)
aPTT: 47 seconds — ABNORMAL HIGH (ref 24–37)

## 2015-06-08 LAB — HEMOGLOBIN A1C
Hgb A1c MFr Bld: 7.8 % — ABNORMAL HIGH (ref 4.8–5.6)
MEAN PLASMA GLUCOSE: 177 mg/dL

## 2015-06-08 LAB — LACTIC ACID, PLASMA
LACTIC ACID, VENOUS: 12.1 mmol/L — AB (ref 0.5–2.0)
Lactic Acid, Venous: 12.6 mmol/L (ref 0.5–2.0)
Lactic Acid, Venous: 11.4 mmol/L (ref 0.5–2.0)

## 2015-06-08 MED ORDER — SODIUM CHLORIDE 0.9 % IV SOLN: Freq: Once | INTRAVENOUS | Status: DC

## 2015-06-08 MED ORDER — IOPAMIDOL (ISOVUE-300) INJECTION 61%
INTRAVENOUS | Status: AC
  Administered 2015-06-08: 90 mL
  Filled 2015-06-08: qty 200

## 2015-06-08 MED ORDER — MIDAZOLAM HCL 2 MG/2ML IJ SOLN
INTRAMUSCULAR | Status: AC | PRN
  Administered 2015-06-08 (×2): 1 mg via INTRAVENOUS

## 2015-06-08 MED ORDER — SODIUM CHLORIDE 0.9 % IV SOLN
INTRAVENOUS | Status: DC
  Administered 2015-06-12 – 2015-06-27 (×4): via INTRAVENOUS
  Administered 2015-06-29: 10 mL/h via INTRAVENOUS
  Administered 2015-07-07 – 2015-07-09 (×2): via INTRAVENOUS

## 2015-06-08 MED ORDER — SODIUM BICARBONATE 8.4 % IV SOLN
INTRAVENOUS | Status: DC
  Administered 2015-06-08: 08:00:00 via INTRAVENOUS
  Filled 2015-06-08 (×6): qty 150

## 2015-06-08 MED ORDER — FENTANYL BOLUS VIA INFUSION
25.0000 ug | INTRAVENOUS | Status: DC | PRN
  Administered 2015-06-13 – 2015-06-18 (×4): 25 ug via INTRAVENOUS
  Filled 2015-06-08: qty 25

## 2015-06-08 MED ORDER — DEXTROSE 5 % IV SOLN
0.0000 ug/min | INTRAVENOUS | Status: DC
  Administered 2015-06-08: 30 ug/min via INTRAVENOUS
  Administered 2015-06-08: 60 ug/min via INTRAVENOUS
  Filled 2015-06-08: qty 16

## 2015-06-08 MED ORDER — SODIUM CHLORIDE 0.9 % IV BOLUS (SEPSIS)
500.0000 mL | Freq: Once | INTRAVENOUS | Status: AC
  Administered 2015-06-08: 500 mL via INTRAVENOUS

## 2015-06-08 MED ORDER — DESMOPRESSIN ACETATE 4 MCG/ML IJ SOLN: 20.0000 ug | Freq: Once | INTRAMUSCULAR | Status: DC

## 2015-06-08 MED ORDER — LIDOCAINE HCL (PF) 1 % IJ SOLN
INTRAMUSCULAR | Status: AC
  Filled 2015-06-08: qty 10

## 2015-06-08 MED ORDER — MIDAZOLAM HCL 2 MG/2ML IJ SOLN
INTRAMUSCULAR | Status: AC
  Filled 2015-06-08: qty 2

## 2015-06-08 MED ORDER — PRISMASOL BGK 4/2.5 32-4-2.5 MEQ/L IV SOLN
INTRAVENOUS | Status: DC
  Administered 2015-06-08 – 2015-07-03 (×74): via INTRAVENOUS_CENTRAL
  Filled 2015-06-08 (×82): qty 5000

## 2015-06-08 MED ORDER — VITAMIN K1 10 MG/ML IJ SOLN
10.0000 mg | Freq: Once | INTRAMUSCULAR | Status: AC
  Administered 2015-06-08: 10 mg via INTRAVENOUS
  Filled 2015-06-08: qty 1

## 2015-06-08 MED ORDER — SODIUM CHLORIDE 0.9 % IV SOLN
20.0000 ug | Freq: Once | INTRAVENOUS | Status: AC
  Administered 2015-06-08: 20 ug via INTRAVENOUS
  Filled 2015-06-08: qty 5

## 2015-06-08 MED ORDER — ONDANSETRON HCL 4 MG/2ML IJ SOLN
4.0000 mg | Freq: Four times a day (QID) | INTRAMUSCULAR | Status: DC | PRN
  Administered 2015-06-08: 4 mg via INTRAVENOUS

## 2015-06-08 MED ORDER — FENTANYL CITRATE (PF) 100 MCG/2ML IJ SOLN: 25.0000 ug | INTRAMUSCULAR | Status: DC | PRN

## 2015-06-08 MED ORDER — NOREPINEPHRINE BITARTRATE 1 MG/ML IV SOLN
0.0000 ug/min | INTRAVENOUS | Status: DC
  Administered 2015-06-08: 20 ug/min via INTRAVENOUS

## 2015-06-08 MED ORDER — FENTANYL CITRATE (PF) 100 MCG/2ML IJ SOLN
25.0000 ug | INTRAMUSCULAR | Status: DC | PRN
  Administered 2015-06-08 (×2): 50 ug via INTRAVENOUS
  Filled 2015-06-08 (×2): qty 2

## 2015-06-08 MED ORDER — SODIUM CHLORIDE 0.9 % IV BOLUS (SEPSIS)
1000.0000 mL | Freq: Once | INTRAVENOUS | Status: AC
  Administered 2015-06-08: 1000 mL via INTRAVENOUS

## 2015-06-08 MED ORDER — EPINEPHRINE HCL 1 MG/ML IJ SOLN
0.5000 ug/min | INTRAVENOUS | Status: DC
  Administered 2015-06-08 – 2015-06-09 (×2): 5 ug/min via INTRAVENOUS
  Filled 2015-06-08 (×2): qty 4

## 2015-06-08 MED ORDER — PRISMASOL BGK 4/2.5 32-4-2.5 MEQ/L IV SOLN
INTRAVENOUS | Status: DC
  Administered 2015-06-08 – 2015-07-03 (×40): via INTRAVENOUS_CENTRAL
  Filled 2015-06-08 (×47): qty 5000

## 2015-06-08 MED ORDER — ETOMIDATE 2 MG/ML IV SOLN
20.0000 mg | Freq: Once | INTRAVENOUS | Status: AC
  Administered 2015-06-08: 20 mg via INTRAVENOUS

## 2015-06-08 MED ORDER — DEXTROSE 5 % IV SOLN
0.0000 ug/min | INTRAVENOUS | Status: DC
  Administered 2015-06-08: 40 ug/min via INTRAVENOUS
  Administered 2015-06-08: 45 ug/min via INTRAVENOUS
  Administered 2015-06-09: 35 ug/min via INTRAVENOUS
  Administered 2015-06-09: 45 ug/min via INTRAVENOUS
  Administered 2015-06-10: 25 ug/min via INTRAVENOUS
  Administered 2015-06-10: 14 ug/min via INTRAVENOUS
  Administered 2015-06-11: 13 ug/min via INTRAVENOUS
  Administered 2015-06-12: 20 ug/min via INTRAVENOUS
  Administered 2015-06-13: 14 ug/min via INTRAVENOUS
  Filled 2015-06-08 (×10): qty 16

## 2015-06-08 MED ORDER — SODIUM BICARBONATE 8.4 % IV SOLN
INTRAVENOUS | Status: AC
  Filled 2015-06-08: qty 200

## 2015-06-08 MED ORDER — MIDAZOLAM HCL 2 MG/2ML IJ SOLN
1.0000 mg | INTRAMUSCULAR | Status: DC | PRN
  Administered 2015-06-08 – 2015-06-13 (×15): 2 mg via INTRAVENOUS
  Administered 2015-06-13: 4 mg via INTRAVENOUS
  Administered 2015-06-13 – 2015-06-14 (×2): 2 mg via INTRAVENOUS
  Administered 2015-06-14: 4 mg via INTRAVENOUS
  Administered 2015-06-14 – 2015-06-17 (×8): 2 mg via INTRAVENOUS
  Filled 2015-06-08 (×5): qty 2
  Filled 2015-06-08: qty 4
  Filled 2015-06-08 (×3): qty 2
  Filled 2015-06-08: qty 4
  Filled 2015-06-08 (×3): qty 2
  Filled 2015-06-08 (×4): qty 4
  Filled 2015-06-08 (×3): qty 2
  Filled 2015-06-08: qty 4
  Filled 2015-06-08 (×5): qty 2

## 2015-06-08 MED ORDER — SODIUM CHLORIDE 0.9 % IV SOLN
Freq: Once | INTRAVENOUS | Status: AC
  Administered 2015-06-08: 04:00:00 via INTRAVENOUS

## 2015-06-08 MED ORDER — INSULIN ASPART 100 UNIT/ML ~~LOC~~ SOLN
0.0000 [IU] | SUBCUTANEOUS | Status: DC
  Administered 2015-06-08: 5 [IU] via SUBCUTANEOUS
  Administered 2015-06-08 – 2015-06-09 (×3): 3 [IU] via SUBCUTANEOUS
  Administered 2015-06-09: 2 [IU] via SUBCUTANEOUS
  Administered 2015-06-09 (×2): 3 [IU] via SUBCUTANEOUS
  Administered 2015-06-09: 2 [IU] via SUBCUTANEOUS
  Administered 2015-06-09 – 2015-06-11 (×8): 3 [IU] via SUBCUTANEOUS
  Administered 2015-06-11 (×3): 2 [IU] via SUBCUTANEOUS
  Administered 2015-06-11: 3 [IU] via SUBCUTANEOUS
  Administered 2015-06-12 (×2): 2 [IU] via SUBCUTANEOUS
  Administered 2015-06-12: 3 [IU] via SUBCUTANEOUS
  Administered 2015-06-12 – 2015-06-17 (×14): 2 [IU] via SUBCUTANEOUS
  Administered 2015-06-17: 3 [IU] via SUBCUTANEOUS
  Administered 2015-06-17 (×2): 2 [IU] via SUBCUTANEOUS
  Administered 2015-06-17 – 2015-06-18 (×3): 3 [IU] via SUBCUTANEOUS
  Administered 2015-06-18 – 2015-06-19 (×4): 2 [IU] via SUBCUTANEOUS
  Administered 2015-06-19: 3 [IU] via SUBCUTANEOUS
  Administered 2015-06-19: 2 [IU] via SUBCUTANEOUS
  Administered 2015-06-19: 3 [IU] via SUBCUTANEOUS
  Administered 2015-06-19: 2 [IU] via SUBCUTANEOUS
  Administered 2015-06-20: 3 [IU] via SUBCUTANEOUS
  Administered 2015-06-20: 2 [IU] via SUBCUTANEOUS
  Administered 2015-06-20 (×3): 3 [IU] via SUBCUTANEOUS
  Administered 2015-06-20 (×2): 2 [IU] via SUBCUTANEOUS
  Administered 2015-06-21: 3 [IU] via SUBCUTANEOUS
  Administered 2015-06-21: 5 [IU] via SUBCUTANEOUS
  Administered 2015-06-21: 3 [IU] via SUBCUTANEOUS
  Administered 2015-06-21: 2 [IU] via SUBCUTANEOUS
  Administered 2015-06-21: 3 [IU] via SUBCUTANEOUS
  Administered 2015-06-22 – 2015-06-23 (×6): 5 [IU] via SUBCUTANEOUS
  Administered 2015-06-23 (×3): 3 [IU] via SUBCUTANEOUS
  Administered 2015-06-23 – 2015-06-24 (×3): 5 [IU] via SUBCUTANEOUS
  Administered 2015-06-24: 8 [IU] via SUBCUTANEOUS
  Administered 2015-06-24: 5 [IU] via SUBCUTANEOUS
  Administered 2015-06-24: 8 [IU] via SUBCUTANEOUS
  Administered 2015-06-24: 5 [IU] via SUBCUTANEOUS
  Administered 2015-06-24: 3 [IU] via SUBCUTANEOUS
  Administered 2015-06-25: 5 [IU] via SUBCUTANEOUS
  Administered 2015-06-25: 11 [IU] via SUBCUTANEOUS
  Administered 2015-06-25: 5 [IU] via SUBCUTANEOUS
  Administered 2015-06-25: 8 [IU] via SUBCUTANEOUS
  Administered 2015-06-25 – 2015-06-26 (×3): 5 [IU] via SUBCUTANEOUS
  Administered 2015-06-26 (×2): 8 [IU] via SUBCUTANEOUS
  Administered 2015-06-26 (×2): 5 [IU] via SUBCUTANEOUS
  Administered 2015-06-26: 2 [IU] via SUBCUTANEOUS
  Administered 2015-06-27 (×2): 5 [IU] via SUBCUTANEOUS
  Administered 2015-06-27: 3 [IU] via SUBCUTANEOUS
  Administered 2015-06-27 (×2): 5 [IU] via SUBCUTANEOUS
  Administered 2015-06-27: 3 [IU] via SUBCUTANEOUS
  Administered 2015-06-28 (×2): 5 [IU] via SUBCUTANEOUS
  Administered 2015-06-28: 3 [IU] via SUBCUTANEOUS
  Administered 2015-06-28: 5 [IU] via SUBCUTANEOUS
  Administered 2015-06-28: 3 [IU] via SUBCUTANEOUS
  Administered 2015-06-28: 5 [IU] via SUBCUTANEOUS
  Administered 2015-06-28: 3 [IU] via SUBCUTANEOUS
  Administered 2015-06-29: 5 [IU] via SUBCUTANEOUS
  Administered 2015-06-29 (×4): 3 [IU] via SUBCUTANEOUS
  Administered 2015-06-29: 2 [IU] via SUBCUTANEOUS
  Administered 2015-06-30: 5 [IU] via SUBCUTANEOUS
  Administered 2015-06-30: 8 [IU] via SUBCUTANEOUS
  Administered 2015-06-30: 2 [IU] via SUBCUTANEOUS
  Administered 2015-06-30 – 2015-07-01 (×2): 3 [IU] via SUBCUTANEOUS
  Administered 2015-07-01: 2 [IU] via SUBCUTANEOUS
  Administered 2015-07-01 (×3): 3 [IU] via SUBCUTANEOUS
  Administered 2015-07-02: 8 [IU] via SUBCUTANEOUS
  Administered 2015-07-02 (×2): 2 [IU] via SUBCUTANEOUS
  Administered 2015-07-02: 3 [IU] via SUBCUTANEOUS
  Administered 2015-07-02: 2 [IU] via SUBCUTANEOUS
  Administered 2015-07-03: 3 [IU] via SUBCUTANEOUS
  Administered 2015-07-03 (×2): 2 [IU] via SUBCUTANEOUS
  Administered 2015-07-03: 3 [IU] via SUBCUTANEOUS
  Administered 2015-07-04: 5 [IU] via SUBCUTANEOUS
  Administered 2015-07-04: 3 [IU] via SUBCUTANEOUS
  Administered 2015-07-04: 2 [IU] via SUBCUTANEOUS
  Administered 2015-07-04: 5 [IU] via SUBCUTANEOUS
  Administered 2015-07-04 (×2): 3 [IU] via SUBCUTANEOUS
  Administered 2015-07-05 (×2): 5 [IU] via SUBCUTANEOUS
  Administered 2015-07-05: 3 [IU] via SUBCUTANEOUS
  Administered 2015-07-05: 2 [IU] via SUBCUTANEOUS
  Administered 2015-07-05 – 2015-07-06 (×2): 3 [IU] via SUBCUTANEOUS
  Administered 2015-07-06: 2 [IU] via SUBCUTANEOUS
  Administered 2015-07-06: 3 [IU] via SUBCUTANEOUS
  Administered 2015-07-06: 2 [IU] via SUBCUTANEOUS
  Administered 2015-07-06: 3 [IU] via SUBCUTANEOUS
  Administered 2015-07-06: 2 [IU] via SUBCUTANEOUS
  Administered 2015-07-07 (×2): 3 [IU] via SUBCUTANEOUS
  Administered 2015-07-07 (×3): 2 [IU] via SUBCUTANEOUS
  Administered 2015-07-08: 3 [IU] via SUBCUTANEOUS
  Administered 2015-07-08: 5 [IU] via SUBCUTANEOUS
  Administered 2015-07-08: 3 [IU] via SUBCUTANEOUS
  Administered 2015-07-09 (×3): 2 [IU] via SUBCUTANEOUS
  Administered 2015-07-09: 3 [IU] via SUBCUTANEOUS
  Administered 2015-07-09: 2 [IU] via SUBCUTANEOUS
  Administered 2015-07-10: 5 [IU] via SUBCUTANEOUS
  Administered 2015-07-10 (×3): 3 [IU] via SUBCUTANEOUS
  Administered 2015-07-11: 5 [IU] via SUBCUTANEOUS
  Administered 2015-07-11 (×3): 2 [IU] via SUBCUTANEOUS

## 2015-06-08 MED ORDER — ATROPINE SULFATE 1 MG/10ML IJ SOSY
PREFILLED_SYRINGE | INTRAMUSCULAR | Status: AC
  Filled 2015-06-08: qty 10

## 2015-06-08 MED ORDER — SODIUM CHLORIDE 0.9 % IV SOLN
25.0000 ug/h | INTRAVENOUS | Status: DC
  Administered 2015-06-08: 100 ug/h via INTRAVENOUS
  Administered 2015-06-09: 250 ug/h via INTRAVENOUS
  Administered 2015-06-09: 150 ug/h via INTRAVENOUS
  Administered 2015-06-10: 300 ug/h via INTRAVENOUS
  Administered 2015-06-11 – 2015-06-12 (×4): 200 ug/h via INTRAVENOUS
  Administered 2015-06-13: 100 ug/h via INTRAVENOUS
  Administered 2015-06-13: 200 ug/h via INTRAVENOUS
  Administered 2015-06-14: 150 ug/h via INTRAVENOUS
  Administered 2015-06-14: 200 ug/h via INTRAVENOUS
  Administered 2015-06-15 (×2): 250 ug/h via INTRAVENOUS
  Administered 2015-06-16: 200 ug/h via INTRAVENOUS
  Administered 2015-06-16 – 2015-06-18 (×4): 300 ug/h via INTRAVENOUS
  Filled 2015-06-08 (×21): qty 50

## 2015-06-08 MED ORDER — FENTANYL CITRATE (PF) 100 MCG/2ML IJ SOLN
INTRAMUSCULAR | Status: AC
  Administered 2015-06-08: 100 ug
  Filled 2015-06-08: qty 2

## 2015-06-08 MED ORDER — LIDOCAINE HCL 1 % IJ SOLN
INTRAMUSCULAR | Status: AC
  Filled 2015-06-08: qty 20

## 2015-06-08 MED ORDER — VITAMIN K1 10 MG/ML IJ SOLN
2.5000 mg | Freq: Once | INTRAVENOUS | Status: AC
  Administered 2015-06-08: 2.5 mg via INTRAVENOUS
  Filled 2015-06-08: qty 0.25

## 2015-06-08 MED ORDER — VASOPRESSIN 20 UNIT/ML IV SOLN
0.0300 [IU]/min | INTRAVENOUS | Status: DC
  Administered 2015-06-08 – 2015-06-11 (×4): 0.03 [IU]/min via INTRAVENOUS
  Filled 2015-06-08 (×5): qty 2

## 2015-06-08 MED ORDER — MIDAZOLAM HCL 2 MG/2ML IJ SOLN
INTRAMUSCULAR | Status: AC | PRN
  Administered 2015-06-08: 2 mg via INTRAVENOUS

## 2015-06-08 MED ORDER — FENTANYL CITRATE (PF) 100 MCG/2ML IJ SOLN: 50.0000 ug | Freq: Once | INTRAMUSCULAR | Status: DC

## 2015-06-08 MED ORDER — INSULIN ASPART 100 UNIT/ML ~~LOC~~ SOLN: 0.0000 [IU] | SUBCUTANEOUS | Status: DC

## 2015-06-08 MED ORDER — ROCURONIUM BROMIDE 50 MG/5ML IV SOLN
100.0000 mg | Freq: Once | INTRAVENOUS | Status: AC
  Administered 2015-06-08: 100 mg via INTRAVENOUS
  Filled 2015-06-08: qty 10

## 2015-06-08 MED ORDER — PRISMASOL BGK 0/2.5 32-2.5 MEQ/L IV SOLN
INTRAVENOUS | Status: DC
  Administered 2015-06-08 (×2): via INTRAVENOUS_CENTRAL
  Filled 2015-06-08 (×9): qty 5000

## 2015-06-08 MED ORDER — ONDANSETRON HCL 4 MG/2ML IJ SOLN
INTRAMUSCULAR | Status: AC
Start: 2015-06-08 — End: 2015-06-08
  Filled 2015-06-08: qty 2

## 2015-06-08 MED ORDER — HEPARIN SODIUM (PORCINE) 1000 UNIT/ML DIALYSIS
1000.0000 [IU] | INTRAMUSCULAR | Status: DC | PRN
  Administered 2015-06-08: 2400 [IU] via INTRAVENOUS_CENTRAL
  Filled 2015-06-08 (×2): qty 6
  Filled 2015-06-08: qty 3

## 2015-06-08 MED ORDER — ROCURONIUM BROMIDE 50 MG/5ML IV SOLN: 100.0000 mg/kg | Freq: Once | INTRAVENOUS | Status: DC

## 2015-06-08 MED ORDER — PRISMASOL BGK 4/2.5 32-4-2.5 MEQ/L IV SOLN
INTRAVENOUS | Status: DC
  Administered 2015-06-08 – 2015-07-03 (×210): via INTRAVENOUS_CENTRAL
  Filled 2015-06-08 (×247): qty 5000

## 2015-06-08 MED ORDER — STERILE WATER FOR INJECTION IV SOLN
INTRAVENOUS | Status: DC
  Administered 2015-06-08: 12:00:00 via INTRAVENOUS_CENTRAL
  Filled 2015-06-08 (×5): qty 150

## 2015-06-08 MED ORDER — PRISMASOL BGK 0/2.5 32-2.5 MEQ/L IV SOLN
INTRAVENOUS | Status: DC
Start: 2015-06-08 — End: 2015-06-08
  Administered 2015-06-08: 12:00:00 via INTRAVENOUS_CENTRAL
  Filled 2015-06-08 (×2): qty 5000

## 2015-06-08 MED ORDER — HEPARIN SODIUM (PORCINE) 1000 UNIT/ML IJ SOLN
1000.0000 [IU] | INTRAMUSCULAR | Status: DC | PRN
  Administered 2015-06-08: 1200 [IU] via INTRAVENOUS
  Filled 2015-06-08 (×2): qty 1

## 2015-06-08 MED FILL — Medication: Qty: 1 | Status: AC

## 2015-06-08 NOTE — Progress Notes (Signed)
[facility] Progress Note Patient Name: [name] DOB: [birth year] MRN: [id number]   Date of Service  06/08/2015  HPI/Events of Note  Camera check on patient during cardiac arrest. Refer to code sheet documentation. Patient regained spontaneous circulation after IV epinephrine, bicarbonate, & calcium chloride administered during ACLS algorithm. INR remains 2.8. Fibrinogen 558. PTT 35. Patient's Potassium this morning 6.0 & serum bicarbonate 14 prior to arrest.Patient intubated during resuscitation. I contacted his wife [name] and briefly updated her on his critical status.  eICU Interventions  1. Stat portable chest x-ray & ABG 2. Continuing mechanical ventilation 3. Versed & fentanyl IV when necessary 4. Proceed with stat transfusion 2 units PRBC 5. Ordering 2 units FFP stat transfusion     Intervention Category Major Interventions: Code management / supervision  Tera Partridge 06/08/2015, 7:16 AM

## 2015-06-08 NOTE — Progress Notes (Signed)
  Echocardiogram 2D Echocardiogram has been attempted.Unable to obtain images in parasternal, apical and subcostal views.  Diamond Nickel 06/08/2015, 2:35 PM

## 2015-06-08 NOTE — Progress Notes (Signed)
Initial Nutrition Assessment  DOCUMENTATION CODES:   Obesity unspecified  INTERVENTION:  If aggressive treatment within [place], recommend nutrition support.  Tube Feeding Recommendations:  Initiate Vital High Protein at 25 ml/hr and increase to goal rate of 65 ml/hr (1560 ml) at 6 am the following day.  Prostat TID. Each supplement provides 100 kcals and 15 grams of protein.  This provides 1860 kcals and 181 grams of protein which meets 100% of needs.   NUTRITION DIAGNOSIS:   Inadequate oral intake related to inability to eat as evidenced by NPO status.  GOAL:   Patient will meet greater than or equal to 90% of their needs  MONITOR:   Vent status, Labs, Weight trends, Skin, I & O's  REASON FOR ASSESSMENT:   Ventilator    ASSESSMENT:   75 y.o. male with PMH as outlined below including prostate CA s/p radiation 2011 and RCC s/p left nephrectomy. He was seen in ED 4/22 for clogged catheter which was irrigated and replaced. He apparently has had hematuria for quite some time which has been attributed to scarring from prior radiation.   Significant events: 4/25 admit with septic shock due to UTI + ARF  5/2 admitted to ICU 5/3 intubated, cardiac arrest, resuscitated, CRRT initiated  Pt remains on vent. Tube feeding recommendations provided.  Temp (24hrs), Avg:97.8 F (36.6 C), Min:96.8 F (36 C), Max:98.9 F (37.2 C) VM 20.5 ml  NFPE: No fat depletion, no muscle depletion, unable to assess edema.   Labs reviewed; Na 134, K 6.0, Cl 100, BUN 54, creatinine 6.41, Ca 7.6, GFR 8, CBGs 246. Meds reviewed.  Diet Order:  Diet NPO time specified  Skin:  Reviewed, no issues  Last BM:  4/30  Height:   Ht Readings from Last 1 Encounters:  06/08/15 5\' 9"  (1.753 m)    Weight:   Wt Readings from Last 1 Encounters:  06/08/15 289 lb 7.4 oz (131.3 kg)    Ideal Body Weight:  72.7 kg  BMI:  Body mass index is 42.73 kg/(m^2).  Estimated Nutritional Needs:   Kcal:   [id number]  Protein:  181 g  Fluid:  1.5-1.7L  EDUCATION NEEDS:   No education needs identified at this time  Geoffery Lyons, [place] Dietetic Intern Pager [phone]  Chart reviewed. Appropriate revisions have been made.  Molli Barrows, RD, LDN, [place] Pager# [phone] After Hours Pager# [phone]

## 2015-06-08 NOTE — Procedures (Signed)
Successful coil embo of the Rt renal lower pole branch for peripheral bleeding site No comp Lt femoral artery 5 fr sheath remains Full report in PACS

## 2015-06-08 NOTE — Progress Notes (Signed)
PULMONARY / CRITICAL CARE MEDICINE   Name: [name] MRN: [id number] DOB: [birth year]    ADMISSION DATE:  05/31/2015 CONSULTATION DATE:  05/31/15  REFERRING MD:  EDP  CHIEF COMPLAINT:  Hypotension  HISTORY OF PRESENT ILLNESS:  [name] is a 75 y.o. male with PMH as outlined below including prostate CA s/p radiation 2011 and RCC s/p left nephrectomy.  He was seen in ED 4/22 for clogged catheter which was irrigated and replaced.  He apparently has had hematuria for quite some time which has been attributed to scarring from prior radiation.  He had been in his USOH until roughly 4/20 when he had decreased appetite.  He had not been eating or drinking for the past 5 days. On 04/25 he went to have his INR checked (on coumadin for A.fib and hx DVT / PE) and was found to have INR of 8 and was hypotensive with SBP 70/44.  He was subsequently sent to ED for further evaluation.  In ED, he was found to have UTI and despite 4.5L IVF, he remained hypotensive.  He also had multiple metabolic derangements including Na 124, K 5.0, AGMA, lactate 3, SCr 9.36. PCCM was therefore consulted for admission of septic shock due to urosepsis as well as AKI. ---------------------------------------------------------------------------------------------------------------------------------------------------------------------- He had steady decline in Hgb since 04/25 and received multiple transfusions as well as multiple units FFP.  In addition, he received DDAVP given his uremia.   He was transferred out of ICU 04/30 and [facility] assumed care. He had been doing well up until evening of 05/02 when he began to have worsening right flank pain.  Pain gradually worsened, rated 10/10.  He had STAT CT of the abd / pelvis which revealed increase in size of right perinephric hematoma.  In addition, pt became hypotensive with SBP in 70's.  He was started on levophed with improvement in SBP to 90's.  Labs from that evening with Hgb  6.9, 2u PRBC ordered and pending.  Pt on coumadin and heparin.  Heparin stopped, 2u FFP ordered and 2.5mg  vitamin K also ordered.  Pt transferred to ICU for further management.    No current facility-administered medications on file prior to encounter.   Current Outpatient Prescriptions on File Prior to Encounter  Medication Sig  . carvedilol (COREG) 3.125 MG tablet take 1 tablet by mouth twice a day with meals  . fenofibrate (TRICOR) 145 MG tablet Take 145 mg by mouth daily.   . ferrous sulfate 325 (65 FE) MG EC tablet take 1 tablet by mouth once daily  . furosemide (LASIX) 40 MG tablet take 1 tablet by mouth once daily  . HYDROcodone-acetaminophen (NORCO/VICODIN) 5-325 MG tablet Take 1-2 tablets by mouth every 4 (four) hours as needed (breakthrough pain).  . Niacin CR 1000 MG TBCR Take 1,000 mg by mouth at bedtime.   . polyvinyl alcohol (LIQUIFILM TEARS) 1.4 % ophthalmic solution Place 1 drop into both eyes 2 (two) times daily as needed for dry eyes.  . potassium chloride (K-DUR,KLOR-CON) 10 MEQ tablet take 1 tablet by mouth once daily  . quinapril (ACCUPRIL) 20 MG tablet Take 20 mg by mouth daily.   Marland Kitchen warfarin (COUMADIN) 1 MG tablet Take 1 mg by mouth every Monday, Wednesday, and Friday.  . warfarin (COUMADIN) 5 MG tablet Take 5-6 mg by mouth daily. 6 mg on Monday, Wednesday, and Friday.  Take 5 mg on Tuesday, Thursday, Saturday, and Sunday.    SUBJECTIVE:  New right flank pain 10/10, hypotension  with SBP 70's > improved to 90's with 38mcg levophed. 2u PRBC and 2u FFP pending.  VITAL SIGNS: BP 95/68 mmHg  Pulse 82  Temp(Src) 98.9 F (37.2 C) (Oral)  Resp 13  Ht 5\' 10"  (1.778 m)  Wt 292 lb 5.3 oz (132.6 kg)  BMI 41.94 kg/m2  SpO2 95%  HEMODYNAMICS:    VENTILATOR SETTINGS:  N/A  INTAKE / OUTPUT: I/O last 3 completed shifts: In: 1180.5 [I.V.:420.5; Blood:660; IV Piggyback:100] Out: [id number] [Urine:675; Other:1000]   PHYSICAL EXAMINATION: General: Adult man, in  NAD. Neuro: Awake, oriented. No focal deficits. HEENT: [place]/AT, PERRL, MMM. Cardiovascular: RRR, no MRG. Lungs: Clear bilaterally. Abdomen: Obese, + BS.  Tenderness RLL and RUL, no rebound or guarding. Musculoskeletal: +1 LE edema. Skin: Intact, warm, no rashes.  LABS:  BMET  Recent Labs Lab 06/05/15 0313 06/06/15 0210 06/07/15 0530  NA 133* 133* 135  K 3.7 4.2 4.0  CL 100* 98* 99*  CO2 22 22 25   BUN 66* 79* 50*  CREATININE 6.72* 7.24* 5.17*  GLUCOSE 256* 226* 173*    Electrolytes  Recent Labs Lab 06/01/15 0218  06/05/15 0313 06/06/15 0210 06/07/15 0530  CALCIUM 7.2*  < > 7.6* 7.9* 7.9*  MG 2.1  --   --   --   --   PHOS 5.1*  < > 4.8* 5.3* 4.7*  < > = values in this interval not displayed.  CBC  Recent Labs Lab 06/06/15 0210 06/07/15 0530 06/07/15 1715 06/07/15 2330  WBC 17.8* 14.4* 16.4*  --   HGB 7.6* 6.7* 8.5* 6.9*  HCT 22.3* 20.3* 25.5* 20.6*  PLT 206 204 217  --     Coag's  Recent Labs Lab 06/01/15 1043  06/05/15 0500 06/05/15 1710 06/06/15 0210  APTT 31  --  36  --  31  INR 2.53*  2.79*  < > 2.81* 2.34* 2.04*  < > = values in this interval not displayed.  Sepsis Markers No results for input(s): LATICACIDVEN, PROCALCITON, O2SATVEN in the last 168 hours.  ABG No results for input(s): PHART, PCO2ART, PO2ART in the last 168 hours.  Liver Enzymes  Recent Labs Lab 06/01/15 1043  06/05/15 0313 06/06/15 0210 06/07/15 0530  AST  --   --   --  18  --   ALT  --   --   --  15*  --   ALKPHOS  --   --   --  64  --   BILITOT 1.5*  --   --  0.6  --   ALBUMIN  --   < > 1.7* 1.9*  1.8* 1.7*  < > = values in this interval not displayed.  Cardiac Enzymes No results for input(s): TROPONINI, PROBNP in the last 168 hours.  Glucose  Recent Labs Lab 06/06/15 1929 06/06/15 2216 06/07/15 0728 06/07/15 1144 06/07/15 1812 06/07/15 2300  GLUCAP 136* 187* 174* 285* 151* 275*    Imaging Ct Abdomen Pelvis Wo Contrast  06/08/2015  CLINICAL  DATA:  Decreased hemoglobin and increased abdominal pain. EXAM: CT ABDOMEN AND PELVIS WITHOUT CONTRAST TECHNIQUE: Multidetector CT imaging of the abdomen and pelvis was performed following the standard protocol without IV contrast. COMPARISON:  06/02/2015 FINDINGS: Moderate size bilateral pleural effusions with basilar atelectasis. Effusions are increased since previous study. Large esophageal hiatal hernia behind the heart. Large right perinephric hematoma with hematocrit levels demonstrated. The hematoma surrounds the right kidney and displaces the kidney towards the midline. The hematoma measures about 14.2 x 18 x  20.7 cm. The hematoma is measuring larger than on the previous study suggesting progression. Hematoma extends into the right pericolic gutter and para renal fat Scholl planes. The left kidney is not identified and may be surgically absent. Surgically absent gallbladder. No bile duct dilatation. Unenhanced appearance of liver, spleen, pancreas, adrenal glands, abdominal aorta, and inferior vena cava are unremarkable. Mild mesenteric infiltration suggesting mild mesenteric hemorrhage. Stomach, small bowel, and colon are not abnormally distended. No free air in the abdomen. Pelvis: Foley catheter deflects the bladder. Prostate gland is enlarged. Diverticulosis of the sigmoid colon without evidence of diverticulitis. Degenerative changes in the spine and hips. No destructive bone lesions. IMPRESSION: Large right perinephric hematoma demonstrating increase in size since previous study. Bilateral pleural effusions with basilar atelectasis also increasing. Electronically Signed   By: Lucienne Capers M.D.   On: 06/08/2015 00:57    STUDIES:  CXR 04/25 > Low volumes. LLL opacity may reflect atelectasis or infiltrate. Renal US 04/25 > surgically absent left kidney, no hydronephrosis, small right renal cyst. CT A / P 05/02 > large right perinephric hematoma demonstrating increase in size since previous  study.  CULTURES: Blood 04/25 > Klebsiella  Urine 04/25 >  Multiple species present MRSA nares > negative Blood 04/27 > NGTD  ANTIBIOTICS: Ceftaz 04/25 > Vanc 04/25 > 04/25 Ceftriaxone 04/29 >  SIGNIFICANT EVENTS: 04/25 > admitted with septic shock due to UTI.  Significant ARF. 04/30 > transferred out of ICU. 05/03 > transferred back to ICU due to acute blood loss anemia presumed due to worsening right perinephric hematoma, hypotension.  LINES/TUBES: R IJ HD cath 04/26 >  DISCUSSION: 75 y.o. M with hx RCC and prostate CA s/p radiation 2011 and left nephrectomy with increased INR, ARF, perinephric hematoma.  ASSESSMENT / PLAN:  HEMATOLOGIC / ONCOLOGIC A:   Acute blood loss anemia - worsening / increase in size of right perinephric hematoma. Coagulopathy - s/p multiple units FFP and vitamin K. 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 P:  2u PRBC and 2u FFP now. 2.5mg  vitamin K. Assess H/H. Repeat CBC in AM. May need to consult IR for possible embolization once more stabilized. SCD's.  CARDIOVASCULAR A:  Shock - combination of septic (due to klebsiella bacteremia) and hemorrhagic from right perinephric hematoma. Hx a.fib (on coumadin), HLD, HTN. P:  Hold coumadin / heparin given ongoing bleeding. 2u FFP, 2.5mg  vitamin K now. Assess PTT > may also need protamine. Assess fibrinogen, lactate. Levophed as needed to maintain goal MAP > 65. Hold outpatient carvedilol, cardizem, furosemide, niacin, quinapril, coumadin.  RENAL A:   Acute renal failure - started on temp HD this admission. Chronic foley w/hematuria at admission - improving, likely 2/2 infx Perinephric hematoma - etiology could be benign cause, idiopathic or recurrence of malignancy given his RCC hx; increased in size 05/02. P:   Nephrology following, appreciate the assistance. 2u FFP and 2.5mg  Vitamin K now. May need to consult IR for possible embolization  once more stabilized. BMP in AM.  INFECTIOUS A:   Septic shock - due to klebsiella bacteremia. P:   Continue ceftriaxone.  PULMONARY A: Acute hypoxic respiratory failure. 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 P:   Continue supplemental O2 as needed to maintain SpO2 > 92%. Holding coumadin / heparin given ongoing bleeding.  GASTROINTESTINAL A:   Nutrition. P:   NPO for now.  ENDOCRINE A:  DM. P:   Continue SSI.  NEUROLOGIC A:   Pain. P:   Low dose fentanyl.  Family updated: None.  Interdisciplinary Family Meeting v Palliative Care Meeting:  Due by: 05/09.  CC time:  35 minutes.   Montey Hora, [facility] Pager: [phone]  or [phone] 06/08/2015, 1:45 AM

## 2015-06-08 NOTE — Progress Notes (Signed)
RT note-increased fio2 100% for chest tube insertion.

## 2015-06-08 NOTE — H&P (Signed)
Chief Complaint: Perinephric hematoma  Referring Physician(s): Dr. Simonne Maffucci  Supervising Physician: Daryll Brod  Patient Status: In-pt   History of Present Illness:  Called Urgently to medical ICU to evaluate patient for large perinephric hematoma.  DCARLOS Villegas is a 75 y.o. 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   Patient was admitted to the ICU for septic shock 5/2. Urosepsis with + Blood cultures = Kleb Pneumo.  His Creatinine was notably elevated at 9.33, his baseline is thought to be around 1.7-1.9. PT/INR was elevated.   Last night he developed acute severe right flank pain 10/10.  He was found to have critically low BP 70/51 and Hgb was 6.9.  He coded around 7 this morning. Is currently intubated, on pressors.  He has been given blood products which include FFP and PRBCs.   Past Medical History  Diagnosis Date  . Obesity   . Phlebitis     Lower extermity  . Pulmonary emboli ([place]) 2008    submassive, saddle  . Prostate cancer ([place]) 07/2009  . Sleep apnea     on CPAP  . Hx of echocardiogram 12/04/2010    Normal EF >55% no significant valve disease  . History of stress test 06/27/2009    Low risk and EF of approximately 50%  . DVT (deep venous thrombosis) ([place])   . Chronic kidney disease, stage 3     baseline creatinine ~1.4  . HLD (hyperlipidemia)   . HTN (hypertension)   . Dysrhythmia     A fib  . Diabetes mellitus ([place])     diet controlled  . History of hiatal hernia     Past Surgical History  Procedure Laterality Date  . Nephrectomy  1999    for CA  . Cholecystectomy  1999  . Transurethral resection of bladder tumor N/A 03/04/2013    Procedure: CYSTOSCOPY WITH RIGHT RETROGRADE PYELOGRAM AND BLADDER BIOPSY /CLOT EVACUATION/ BIOPSY PROSTATIC URETHRA WITH FULGERATION ;  Surgeon:  Molli Hazard, MD;  Location: [facility];  Service: Urology;  Laterality: N/A;  . Cystoscopy w/ retrogrades N/A 04/08/2013    Procedure: CYSTOSCOPY WITH CLOT EVACUATION;  Surgeon: Molli Hazard, MD;  Location: [facility];  Service: Urology;  Laterality: N/A;  . Left shoulder repair    . Shoulder open rotator cuff repair Right 05/03/2015    Procedure: RIGHT SHOULDER ROTATOR CUFF REPAIR OPEN WITH GRAFT AND ANCHOR ;  Surgeon: Latanya Maudlin, MD;  Location: [facility];  Service: Orthopedics;  Laterality: Right;    Allergies: Review of patient's allergies indicates no known allergies.  Medications: Prior to Admission medications   Medication Sig Start Date End Date Taking? Authorizing Provider  carvedilol (COREG) 3.125 MG tablet take 1 tablet by mouth twice a day with meals 01/25/15  Yes Troy Sine, MD  fenofibrate (TRICOR) 145 MG tablet Take 145 mg by mouth daily.  10/18/12  Yes Historical Provider, MD  ferrous sulfate 325 (65 FE) MG EC tablet take 1 tablet by mouth once daily 01/17/15  Yes Troy Sine, MD  furosemide (LASIX) 40 MG tablet take 1 tablet by mouth once daily 01/10/15  Yes Troy Sine, MD  HYDROcodone-acetaminophen (NORCO/VICODIN) 5-325 MG tablet Take 1-2 tablets by mouth every 4 (four) hours as needed (breakthrough pain). 05/04/15  Yes Amber Constable, PA-C  Niacin CR 1000 MG TBCR Take  1,000 mg by mouth at bedtime.  11/06/12  Yes Historical Provider, MD  polyvinyl alcohol (LIQUIFILM TEARS) 1.4 % ophthalmic solution Place 1 drop into both eyes 2 (two) times daily as needed for dry eyes.   Yes Historical Provider, MD  potassium chloride (K-DUR,KLOR-CON) 10 MEQ tablet take 1 tablet by mouth once daily 01/25/15  Yes Troy Sine, MD  quinapril (ACCUPRIL) 20 MG tablet Take 20 mg by mouth daily.  11/06/14  Yes Historical Provider, MD  warfarin (COUMADIN) 1 MG tablet Take 1 mg by mouth every Monday, Wednesday, and Friday.   Yes Historical Provider, MD  warfarin (COUMADIN) 5 MG  tablet Take 5-6 mg by mouth daily. 6 mg on Monday, Wednesday, and Friday.  Take 5 mg on Tuesday, Thursday, Saturday, and Sunday. 11/13/12  Yes Historical Provider, MD     Family History  Problem Relation Age of Onset  . Cancer Mother   . Diabetes Father   . Cancer Father     Social History   Social History  . Marital Status: Married    Spouse Name: N/A  . Number of Children: N/A  . Years of Education: N/A   Social History Main Topics  . Smoking status: Never Smoker   . Smokeless tobacco: Never Used  . Alcohol Use: 0.0 oz/week    0 Standard drinks or equivalent per week     Comment: occasional beer or wine  . Drug Use: No  . Sexual Activity: Not Asked   Other Topics Concern  . None   Social History Narrative     Review of Systems  Unable to perform ROS: Intubated    Vital Signs: BP 258/125 mmHg  Pulse 91  Temp(Src) 97.7 F (36.5 C) (Oral)  Resp 19  Ht 5\' 9"  (1.753 m)  Wt 289 lb 7.4 oz (131.3 kg)  BMI 42.73 kg/m2  SpO2 93%  Physical Exam  Constitutional:  Obese, Critically ill  Cardiovascular: Normal rate, regular rhythm and normal heart sounds.   Pulmonary/Chest:  Intubated, clear  Abdominal:  Large perinephric hematoma  Neurological:  Intubated/sedated  Skin: Skin is warm and dry.  Vitals reviewed.   Mallampati Score:  MD Evaluation Airway: Other (comments) Airway comments: Intubated Heart: WNL Abdomen: Other (comments) Abdomen comments: large perinephric hematoma Chest/ Lungs: Other (comments) Chest/ lungs comments: Ventilator/ Clear ASA  Classification: 4 Mallampati/Airway Score:  (Intubated)  Imaging: Ct Abdomen Pelvis Wo Contrast  06/08/2015  CLINICAL DATA:  Decreased hemoglobin and increased abdominal pain. EXAM: CT ABDOMEN AND PELVIS WITHOUT CONTRAST TECHNIQUE: Multidetector CT imaging of the abdomen and pelvis was performed following the standard protocol without IV contrast. COMPARISON:  06/02/2015 FINDINGS: Moderate size bilateral  pleural effusions with basilar atelectasis. Effusions are increased since previous study. Large esophageal hiatal hernia behind the heart. Large right perinephric hematoma with hematocrit levels demonstrated. The hematoma surrounds the right kidney and displaces the kidney towards the midline. The hematoma measures about 14.2 x 18 x 20.7 cm. The hematoma is measuring larger than on the previous study suggesting progression. Hematoma extends into the right pericolic gutter and para renal fat Scholl planes. The left kidney is not identified and may be surgically absent. Surgically absent gallbladder. No bile duct dilatation. Unenhanced appearance of liver, spleen, pancreas, adrenal glands, abdominal aorta, and inferior vena cava are unremarkable. Mild mesenteric infiltration suggesting mild mesenteric hemorrhage. Stomach, small bowel, and colon are not abnormally distended. No free air in the abdomen. Pelvis: Foley catheter deflects the bladder. Prostate gland is enlarged.  Diverticulosis of the sigmoid colon without evidence of diverticulitis. Degenerative changes in the spine and hips. No destructive bone lesions. IMPRESSION: Large right perinephric hematoma demonstrating increase in size since previous study. Bilateral pleural effusions with basilar atelectasis also increasing. Electronically Signed   By: Lucienne Capers M.D.   On: 06/08/2015 00:57   Ct Abdomen Pelvis Wo Contrast  06/02/2015  CLINICAL DATA:  Mid abdomen pain. Acute renal failure, evaluate kidneys. EXAM: CT ABDOMEN AND PELVIS WITHOUT CONTRAST TECHNIQUE: Multidetector CT imaging of the abdomen and pelvis was performed following the standard protocol without IV contrast. COMPARISON:  March 15, 2014 FINDINGS: Lower chest: There are small bilateral pleural effusions. Mild atelectasis of the posterior lung bases are noted. The heart size is normal. Hepatobiliary: The liver is normal appearing on this noncontrast exam. No focal masses identified.  Patient status post prior cholecystectomy. Pancreas: No mass or inflammatory process identified on this un-enhanced exam. Spleen: Within normal limits in size. Adrenals/Urinary Tract: The patient is status post left nephrectomy. There is a large right perinephric hematoma displacing the kidney superiorly and medially. There is no right hydronephrosis. Evaluation of right kidney is limited without intravenous contrast. The adrenal glands are normal. The bladder is decompressed with a Foley catheter in place. Stomach/Bowel: No evidence of obstruction, inflammatory process, or abnormal fluid collections. There is a hiatal hernia. There is diverticulosis of colon. Vascular/Lymphatic: No pathologically enlarged lymph nodes. No evidence of abdominal aortic aneurysm. There is atherosclerosis of the aorta. Reproductive: No mass or other significant abnormality. Other: There is ascites in the abdomen and pelvis. Musculoskeletal: Degenerative joint changes of the spine are identified. Stable focal sclerosis is identified in the right ilium unchanged. IMPRESSION: Large right perinephric hematoma displacing the right kidney superiorly and medially. There is no right hydronephrosis. Prior left nephrectomy. Small amount of ascites in the abdomen and pelvis. Bilateral pleural effusions. Electronically Signed   By: Abelardo Diesel M.D.   On: 06/02/2015 13:37   Dg Chest 2 View  05/31/2015  CLINICAL DATA:  75 year old male with hypotension concerning for sepsis EXAM: CHEST  2 VIEW COMPARISON:  Prior chest x-ray 03/18/2013 FINDINGS: Low inspiratory volumes with bibasilar atelectasis. There is a left lower lobe retrocardiac opacity resolving in a positive spine sign on the lateral view. Cardiac and mediastinal contours are enlarged but otherwise unchanged. Moderate hiatal hernia again noted. No pulmonary edema, pleural effusion or pneumothorax. No acute osseous abnormality. IMPRESSION: 1. Left lower lobe opacity may reflect atelectasis  or infiltrate. Given the clinical history of sepsis, pneumonia is not excluded. 2. Low inspiratory volumes. 3. Moderate hiatal hernia. Electronically Signed   By: Jacqulynn Cadet M.D.   On: 05/31/2015 13:25   US Renal Port  06/01/2015  CLINICAL DATA:  Acute onset of septic shock.  Initial encounter. EXAM: RENAL / URINARY TRACT ULTRASOUND COMPLETE COMPARISON:  CT of the abdomen and pelvis performed 03/15/2014, and renal ultrasound performed 03/06/2013 FINDINGS: Right Kidney: Length: 17.6 cm. Echogenicity within normal limits. Compensatory nephromegaly is noted. A small 1.9 cm cyst is noted at the upper pole of the right kidney. No hydronephrosis visualized. Left Kidney: Status post left-sided nephrectomy. Bladder: Decompressed and not well assessed. IMPRESSION: 1. No evidence of hydronephrosis. 2. Small right renal cyst noted. Electronically Signed   By: Garald Balding M.D.   On: 06/01/2015 01:42   Dg Chest Port 1 View  06/02/2015  CLINICAL DATA:  Dyspnea EXAM: PORTABLE CHEST 1 VIEW COMPARISON:  Yesterday FINDINGS: Borderline cardiomegaly. Stable right jugular central  venous catheter with its tip in the mid SVC. Bibasilar hazy opacity is unchanged. Upper lungs clear. No pneumothorax. IMPRESSION: Stable bibasilar atelectasis versus airspace disease. Electronically Signed   By: Marybelle Killings M.D.   On: 06/02/2015 08:01   Dg Chest Port 1 View  06/01/2015  CLINICAL DATA:  Central line placement. EXAM: PORTABLE CHEST 1 VIEW COMPARISON:  05/31/2015. FINDINGS: 1430 hours. New right IJ central venous catheter extends to the mid SVC level. There are persistent low lung volumes with mildly increased bibasilar atelectasis. No definite edema, pleural effusion or pneumothorax. The heart size and mediastinal contours are stable. IMPRESSION: Interval central line placement as described without complicating pneumothorax. Mildly increased bibasilar atelectasis. Electronically Signed   By: Richardean Sale M.D.   On:  06/01/2015 14:42    Labs:  CBC:  Recent Labs  06/06/15 0210 06/07/15 0530 06/07/15 1715 06/07/15 2330 06/08/15 0537  WBC 17.8* 14.4* 16.4*  --  45.8*  HGB 7.6* 6.7* 8.5* 6.9* 7.9*  HCT 22.3* 20.3* 25.5* 20.6* 23.5*  PLT 206 204 217  --  268    COAGS:  Recent Labs  06/01/15 1043  06/05/15 0500 06/05/15 1710 06/06/15 0210 06/08/15 0615  INR 2.53*  2.79*  < > 2.81* 2.34* 2.04* 2.80*  APTT 31  --  36  --  31 35  < > = values in this interval not displayed.  BMP:  Recent Labs  06/05/15 0313 06/06/15 0210 06/07/15 0530 06/08/15 0537  NA 133* 133* 135 134*  K 3.7 4.2 4.0 6.0*  CL 100* 98* 99* 100*  CO2 22 22 25  14*  GLUCOSE 256* 226* 173* 243*  BUN 66* 79* 50* 65*  CALCIUM 7.6* 7.9* 7.9* 7.6*  CREATININE 6.72* 7.24* 5.17* 6.41*  GFRNONAA 7* 7* 10* 8*  GFRAA 8* 8* 12* 9*    LIVER FUNCTION TESTS:  Recent Labs  04/29/15 1545 06/01/15 1043  06/04/15 0435 06/05/15 0313 06/06/15 0210 06/07/15 0530  BILITOT 0.7 1.5*  --   --   --  0.6  --   AST 26  --   --   --   --  18  --   ALT 20  --   --   --   --  15*  --   ALKPHOS 29*  --   --   --   --  64  --   PROT 6.4*  --   --   --   --  5.2*  --   ALBUMIN 3.6  --   < > 1.9* 1.7* 1.9*  1.8* 1.7*  < > = values in this interval not displayed.  TUMOR MARKERS: No results for input(s): AFPTM, CEA, CA199, CHROMGRNA in the last 8760 hours.  Assessment and Plan:  Large perinephric hematoma secondary to coagulopathy (afib on coumadin) and urosepsis.  Taken urgently to IR for Angiogram with renal artery emoblization.  Risks and Benefits discussed with the patient including, but not limited to bleeding, infection, vascular injury, and renal failure requiring dialysis.  All of the patient's questions were answered, patient is agreeable to proceed. Consent signed and in chart.  Thank you for this interesting consult.  I greatly enjoyed meeting LAMARQUIS OPARA and look forward to participating in their care.  A  copy of this report was sent to the requesting provider on this date.  Electronically Signed: Murrell Redden PA-C 06/08/2015, 8:20 AM   I spent a total of 20 Minutes in face to face in  clinical consultation, greater than 50% of which was counseling/coordinating care for URGENT renal embolization.

## 2015-06-08 NOTE — Progress Notes (Signed)
CRITICAL VALUE ALERT  Critical value received:  Lactic Acid 12.6   Date of notification:  06/08/15  Time of notification: [id number]  Critical value read back: Yes  Nurse who received alert: Polly Cobia RN   MD notified (1st page):  MD Titus Mould   Time of first page:  38  MD notified (2nd page):  Time of second page:  Responding MD:  MD Titus Mould   Time MD responded:  MD Titus Mould on unit [phone]

## 2015-06-08 NOTE — Progress Notes (Addendum)
Notified MD Titus Mould in regards to Hgb of 7.1 and coagulation labs. Ordered to give 2 units of FFPs and 1 unit of PRBCs. Will continue to monitor and assess.

## 2015-06-08 NOTE — Procedures (Signed)
Chest Tube Insertion Procedure Note right  Indications:  Clinically significant Pneumothorax  Pre-operative Diagnosis: Pneumothorax  Post-operative Diagnosis: Pneumothorax, massive sub q, med?  Procedure Details  Informed consent was obtained for the procedure, including sedation.  Risks of lung perforation, hemorrhage, arrhythmia, and adverse drug reaction were discussed.   After sterile skin prep, using standard technique, a 20 French tube was placed in the right lateral 5 rib space.  Findings: 20 ml of serosanguinous fluid obtained  Estimated Blood Loss:  Minimal         Specimens:  None              Complications:  None; patient tolerated the procedure well.         Disposition: ICU - intubated and critically ill.         Condition: unstable  Attending Attestation: I performed the procedure.  No gush air  Lavon Paganini. Titus Mould, MD, [place] Pgr: [facility]

## 2015-06-08 NOTE — Progress Notes (Signed)
eLink Physician-Brief Progress Note Patient Name: MACOY SIXTOS DOB: Nov 09, 1940 MRN: [id number]   Date of Service  06/08/2015  HPI/Events of Note  Temp = 94.5 F on CRRT. Request for [employer].  eICU Interventions  Will order [employer].      Intervention Category Intermediate Interventions: Other:  Lysle Dingwall 06/08/2015, 6:34 PM

## 2015-06-08 NOTE — Telephone Encounter (Signed)
[id number] Rosana Hoes, BSN, RN3, CCM, CN: tct-wife of patient who is in the icu at the [facility].  Was moved out of icu yesterday became unstable and required retrun to icu.  Family was called in this am due to instability.  Offered prayer and support to the family.  The patient is now stable this om after embolization of a large hematoma in the right flank area.

## 2015-06-08 NOTE — Sedation Documentation (Signed)
Escorted to room via bed on monitor with RN and RT

## 2015-06-08 NOTE — Sedation Documentation (Signed)
PRBCs unit [id number] started

## 2015-06-08 NOTE — Procedures (Signed)
Central Venous Catheter Insertion Procedure Note VICTORIANO TOOLE [id number] May 08, 1940  Procedure: Insertion of Central Venous Catheter Indications: Assessment of intravascular volume, Drug and/or fluid administration and Frequent blood sampling  Procedure Details Consent: Unable to obtain consent because of emergent medical necessity. Time Out: Verified patient identification, verified procedure, site/side was marked, verified correct patient position, special equipment/implants available, medications/allergies/relevent history reviewed, required imaging and test results available.  Performed  Maximum sterile technique was used including antiseptics, cap, gloves, gown, hand hygiene, mask and sheet. Skin prep: Chlorhexidine; local anesthetic administered A antimicrobial bonded/coated triple lumen catheter was placed in the left femoral vein due to emergent situation using the Seldinger technique. Ultrasound guidance used.Yes.   Catheter placed to 20 cm. Blood aspirated via all 3 ports and then flushed x 3. Line sutured x 2 and dressing applied.  Evaluation Blood flow good Complications: No apparent complications Patient did tolerate procedure well. Chest X-ray ordered to verify placement.  CXR: not needed  [facility] Deaysia Grigoryan ACNP [name] PCCM Pager [phone] till 3 pm If no answer page [phone] 06/08/2015, 8:18 AM

## 2015-06-08 NOTE — Progress Notes (Signed)
[facility] Progress Note Patient Name: [name] DOB: [birth year] MRN: [id number]   Date of Service  06/08/2015  HPI/Events of Note  Camera check on patient shows laying recumbent. Nursing staff at bedside. Initiating transfusion of FFP. Currently requiring vasopressor support. Nurse reports patient complaining of abdominal pain in the area of hematoma. Currently on nonrebreather mask but reportedly not requiring this flow oxygen to maintain saturation.  eICU Interventions  1. Permission given to access dialysis ports to infuse additional blood products simultaneously 2. Wean FiO2 for saturation greater than 92%     Intervention Category Major Interventions: Shock - evaluation and management  Tera Partridge 06/08/2015, 1:45 AM

## 2015-06-08 NOTE — Progress Notes (Signed)
RT Note-

## 2015-06-08 NOTE — Procedures (Signed)
Arterial Catheter Insertion Procedure Note [name] [id number] [birth year]  Procedure: Insertion of Arterial Catheter  Indications: Blood pressure monitoring and Frequent blood sampling  Procedure Details Consent: Unable to obtain consent because of emergent medical necessity. Time Out: Verified patient identification, verified procedure, site/side was marked, verified correct patient position, special equipment/implants available, medications/allergies/relevent history reviewed, required imaging and test results available.  Performed  Maximum sterile technique was used including antiseptics, cap, gloves, gown, hand hygiene, mask and sheet. Skin prep: Chlorhexidine; local anesthetic administered 20 gauge catheter was inserted into right femoral artery using the Seldinger technique.  Evaluation Blood flow good; BP tracing good. Complications: No apparent complications.   [name] [name] ACNP [name] PCCM Pager [phone] till 3 pm If no answer page [phone] 06/08/2015, 8:20 AM

## 2015-06-08 NOTE — Sedation Documentation (Signed)
FFP started

## 2015-06-08 NOTE — Progress Notes (Signed)
Pt transported to & from IR with no complications noted.

## 2015-06-08 NOTE — Progress Notes (Signed)
[facility] Progress Note Patient Name: ALEXANDR LUPIA DOB: 08-02-1940 MRN: [id number]   Date of Service  06/08/2015  HPI/Events of Note  Patient continues to have shock requiring Levophed infusion. Hgb now 7.9 after 2u PRBC. Coags are pending. Spoke with Urology and then IR at their instruction regarding high probability of ongoing bleeding.   eICU Interventions  1. IR to assess patient for angiogram w/ possible embolization 2. Transfusing 2u PRBC now 3. Awaiting Coags to determine need for further FFP vs Cryo     Intervention Category Major Interventions: Shock - evaluation and management;Hemorrhage - evaluation and management  Tera Partridge 06/08/2015, 6:37 AM

## 2015-06-08 NOTE — Progress Notes (Addendum)
CRITICAL VALUE ALERT  Critical value received:  Lactic Acid 11.4   Date of notification: 06/08/15  Time of notification:  1508  Critical value read back: Yes  Nurse who received alert: Regino Schultze RN   MD notified (1st page):  MD Titus Mould   Time of first page:  1600  MD notified (2nd page):  Time of second page:  Responding MD:  MD Titus Mould   Time MD responded:  81 MD Unit

## 2015-06-08 NOTE — Progress Notes (Addendum)
Called at 2000 regarding Patient with new right flank pain. RRT unable to come right away, RN advised to await orders per [facility] NP, VSS at that time. Checked in with floor RN at 2200, Pt asleep after dilaudid and Oxycodone. Orders received for STAT pelvic CT.  BP soft but latest BP improved. Advised RN that I would return to follow up and call if Pt worsens. Call received at 2300 for Pt with critical BP 70/51, Pt awake again complaining of 10/10 pain. [facility] NP Tylene Fantasia paged and updated while RRT en route. Orders received for Stat H/H and fluid bolus. Pt lethargic but oriented x4. Lungs diminished, ABD distended and more firm and tender on right side. Labs drawn from HD pigtail and Pt taken to CT STAT. While in CT Pt grew more diaphoretic and pale, additional 500 NS bolus started while in CT scan. HBG critical results 6.9 called to NP and CCM consult suggested. Blood products ordered and CCM consulted for transfer. Upon arrival back to room Pt BP 56/34. Levophed gtt started and titrated to 30 mcg while awaiting ICU bed. Pt transferred to 2 M04 at 0050. Care assumed per [facility] ICU RN.

## 2015-06-08 NOTE — Progress Notes (Signed)
[facility] Progress Note Patient Name: [name] DOB: 04/18/1940 MRN: [id number]   Date of Service  06/08/2015  HPI/Events of Note  Notified by hospitalist of persistent hypotension & increasing abdominal pain. Patient on systemic and coagulation with Coumadin and heparin infusion with known perinephric hematoma. Review of medical records shows remote  Saddle pulmonary embolus. Known history of atrial fibrillation. Has peripheral IV & trialysis  catheter in place.   eICU Interventions  1. Transferring patient to ICU for ongoing fluid and blood product resuscitation 2. Plan to reverse coagulopathy given remote nature of pulmonary embolus and risk of ongoing bleeding 3. Await CT scan of the abdomen result before consult having interventional radiology for possible embolization post stabilization     Intervention Category Major Interventions: Shock - evaluation and management;Hemorrhage - evaluation and management  Tera Partridge 06/08/2015, 12:41 AM

## 2015-06-08 NOTE — Procedures (Signed)
Chest Tube Insertion Procedure Note left  Indications:  Clinically significant Pneumothorax and massive air sub q, med  Pre-operative Diagnosis: Pneumothorax and massive air sub q, med  Post-operative Diagnosis: Pneumothorax left Procedure Details  Informed consent was obtained for the procedure, including sedation.  Risks of lung perforation, hemorrhage, arrhythmia, and adverse drug reaction were discussed.   After sterile skin prep, using standard technique, a 20 French tube was placed in the left lateral 5th rib space.  Findings: Air gush  Estimated Blood Loss:  Minimal         Specimens:  None              Complications:  None; patient tolerated the procedure well.         Disposition: ICU - intubated and critically ill.         Condition: unstable  Attending Attestation: I performed the procedure.  Lavon Paganini. Titus Mould, MD, [place] Pgr: [facility]

## 2015-06-08 NOTE — Procedures (Signed)
Intubation Procedure Note [name] [id number] [birth year]  Procedure: Intubation Indications: Respiratory insufficiency  Procedure Details Consent: Unable to obtain consent because of emergent medical necessity. Time Out: Verified patient identification, verified procedure, site/side was marked, verified correct patient position, special equipment/implants available, medications/allergies/relevent history reviewed, required imaging and test results available.  Performed  Maximum sterile technique was used including gloves and mask.  MAC and 3    Evaluation Hemodynamic Status: placed while coding; O2 sats: currently acceptable Patient's Current Condition: unstable Complications: No apparent complications Patient did tolerate procedure well. Chest X-ray ordered to verify placement.  CXR: pending.   [name] 06/08/2015

## 2015-06-08 NOTE — Sedation Documentation (Signed)
tec'd from ICU with RN and RT with vasopressors , assist to procedure table,

## 2015-06-08 NOTE — Progress Notes (Signed)
06/08/15 00:50 Patient transferred to 2M04. Patient's belongings with wife. Betrice Wanat, Wonda Cheng, Therapist, sports

## 2015-06-08 NOTE — Progress Notes (Signed)
Notified MD Titus Mould in regards to patient developing Subcutaneous air upper chest. Patient has bilateral breath sounds, have been diminished. Patient on ventilator and maintaining well in regards to tidal volumes and peak pressures. MD Titus Mould ordered STAT Chest X-ray and ABG.

## 2015-06-08 NOTE — Progress Notes (Signed)
Patient back from CT.BP 57/36 HR 87,patient  alert and responsive.Tylene Fantasia notified.Rapid response RN at bedside. [name], Wonda Cheng, Therapist, sports

## 2015-06-08 NOTE — Code Documentation (Signed)
  Patient Name: [name]   MRN: [id number]   Date of Birth/ Sex: 25-Dec-1940 , male      Admission Date: 05/31/2015  Attending Provider: Cherene Altes, MD  Primary Diagnosis: <principal problem not specified>   Indication: Pt was in his usual state of health until this AM, when he was noted to be nonresponsive, pulseless. Code blue was subsequently called. At the time of arrival on scene, ACLS protocol was underway.   Technical Description:  - CPR performance duration:  12 minutes  - Was defibrillation or cardioversion used? No   - Was external pacer placed? No  - Was patient intubated pre/post CPR? Yes   Medications Administered: Y = Yes; Blank = No Amiodarone    Atropine    Calcium  Y  Epinephrine  Y  Lidocaine    Magnesium    Norepinephrine    Phenylephrine    Sodium bicarbonate  Y  Vasopressin     Post CPR evaluation:  - Final Status - Was patient successfully resuscitated ? Yes - What is current rhythm? Afib - What is current hemodynamic status? Guarded  Miscellaneous Information:  - Labs sent, including:   - Primary team notified?  Yes Gaylyn Lambert, PCCM NP present at bedside  - Family Notified? Yes  - Additional notes/ transfer status: Coded in MICU     Collier Salina, MD  06/08/2015, 7:51 AM

## 2015-06-08 NOTE — Progress Notes (Signed)
Inpatient Diabetes Program Recommendations  AACE/ADA: New Consensus Statement on Inpatient Glycemic Control (2015)  Target Ranges:  Prepandial:   less than 140 mg/dL      Peak postprandial:   less than 180 mg/dL (1-2 hours)      Critically ill patients:  140 - 180 mg/dL   Review of Glycemic Control  Results for [name], [name] (MRN [id number]) as of 06/08/2015 09:43  Ref. Range 06/07/2015 07:28 06/07/2015 11:44 06/07/2015 18:12 06/07/2015 23:00 06/08/2015 01:12  Glucose-Capillary Latest Ref Range: 65-99 mg/dL 174 (H) 285 (H) 151 (H) 275 (H) 246 (H)   Results for [name], [name] (MRN [id number]) as of 06/08/2015 09:43  Ref. Range 06/08/2015 05:37  Sodium Latest Ref Range: 135-145 mmol/L 134 (L)  Potassium Latest Ref Range: 3.5-5.1 mmol/L 6.0 (H)  Chloride Latest Ref Range: 101-111 mmol/L 100 (L)  CO2 Latest Ref Range: 22-32 mmol/L 14 (L)  BUN Latest Ref Range: 6-20 mg/dL 65 (H)  Creatinine Latest Ref Range: 0.61-1.24 mg/dL 6.41 (H)  Calcium Latest Ref Range: 8.9-10.3 mg/dL 7.6 (L)  EGFR (Non-African Amer.) Latest Ref Range: >60 mL/min 8 (L)  EGFR (African American) Latest Ref Range: >60 mL/min 9 (L)  Glucose Latest Ref Range: 65-99 mg/dL 243 (H)  Anion gap Latest Ref Range: 5-15  20 (H)   RECOMMEND ICU HYPERGLYCEMIA PROTOCOL to maintain glycemic control.  Will continue to follow. Thank you. Lorenda Peck, RD, LDN, CDE Inpatient Diabetes Coordinator [phone]

## 2015-06-08 NOTE — Progress Notes (Signed)
CRITICAL VALUE ALERT  Critical value received: hemoglobin=6.9  Date of notification:  06/07/15 Time of notification:  23:55  Critical value read back:Yes.    Nurse who received alert:  Vinie Sill  MD notified (1st page):  Josephine Cables  Time of first page:  00:003  MD notified (2nd page):  Time of second page:  Responding MD: Josephine Cables  Time MD responded:  00:05

## 2015-06-08 NOTE — Progress Notes (Signed)
[facility] Progress Note Patient Name: [name] DOB: Aug 26, 1940 MRN: [id number]   Date of Service  06/08/2015  HPI/Events of Note  Levophed dose slightly reduced but still has high pressor requirements. Continues on CRRT.   eICU Interventions  Will recheck Hb at MN to see if there is evidence of continued significant bleeding.         Laverle Hobby 06/08/2015, 11:59 PM

## 2015-06-08 NOTE — Progress Notes (Signed)
eLink Physician-Brief Progress Note Patient Name: [name] DOB: 1941/02/05 MRN: [id number]   Date of Service  06/08/2015  HPI/Events of Note  Lactic Acid level = 12.1. No CVL, however, has pigtail on CRRT catheter.   eICU Interventions  Will order: 1. Try to monitor CRRT pigtail for CVP. 2. Repeat lactic Acid at 1 AM.  May need CVL for CVP and Coox measurement.     Intervention Category Major Interventions: Acid-Base disturbance - evaluation and management  Sommer,Steven Eugene 06/08/2015, 8:47 PM

## 2015-06-08 NOTE — Progress Notes (Signed)
[facility] P[facility]  Called to bedside emergently due to cardiac arrest  Chart reviewed, history of prostate cancer, s/p nephrectomy for renal cell carcinoma who presented on 5/3 with UTI and perinephric bleeding.  Noted to have AKI.  Treated with antibiotics, blood transfusions and FFP.  He was given DDAVP.  Flank pain worsened overnight, CT abdomen showed an increasing in size flank hematoma. Had a cardiac arrest this morning (12 minutes total).   IR and urology contacted, IR embolization recommended.  Last INR was 2.8  Now with severe metabolic acidosis, signs on exam worrisome for ongoing bleeding.    On exam: lungs with rhonchi Reaching for tube Tachycardic Belly distended,  Reaching for endotracheal tube, good strength  CT images reviewed  INR 2.8, Hgb 7.9  Impression/Plan:  Acute hemorrhagic shock> continue epinephrine gtt Perinephric hematoma> uncertain source of bleeding, worsening on this mornings CT Coagulopathy> medication induced and due to bleeding; has received 10 U FFP; Vit K administered, DDAVP administered; keep FFP and PRBC at bedside; AKI> worsening.  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  Considering the situation I believe if we don't do this he will die.  I also explained that given his recent bacteremia he may die even if we proceed with IR intervention but we really have no other choice given his illness.  Will notify renal to start CVVHD after IR Septic shock/klebiella > continue antibiotics.  Discuss possibility of abscess with urology. Acute respiratory failure > continue full vent support, administer fentanyl and versed for comfort  My cc time 60 minutes  Wife and son updated bedside  Roselie Awkward, MD [facility] Pager: [phone] Cell: [phone] After 3pm or if no response, call [phone]

## 2015-06-08 NOTE — Progress Notes (Signed)
Assessment/ Plan: Pt is a 75 y.o. yo male who was admitted on 05/31/2015 with septic shock 2/2 klebsiella bacteremia. Readmitted to the ICU 06/08/2015 with hemorrhagic shock in the setting of a right perinephric hematoma. Now s/p coil embolization.  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 Patient's Cr was notably elevated at 9.33, his baseline is thought to be around 1.7-1.9.   1. AKI/CKD, patient s/p left nephrectomy and now s/p partial right renal artery coil embolization. Oliguric.   - On CVVHD given tenuous volume status - goal net even 2. Hemorrhagic shock 2/2 Acute blood loss anemia 2/2 right perinephric hematoma. Also with coagulopathy, s/p FFP and vit K  - s/p coil embolization by IR  - On pressors per CCM  - Trend CBC, transfuse prn  - Trend INR 3. Urosepsis / Positive blood culture pan-sensitive Kleb Pneumo. abx per primary team.  4. Retroperitoneal, perinephric hematoma with displacement of right kidney: No evidence of hydro. No heparin with HD. Urology following, recommends reimaging in about 4 mths.  Subjective: Currently intubated. Does not respond to commands.   Objective: Vital signs in last 24 hours: Temp:  [96.8 F (36 C)-98.9 F (37.2 C)] 97.5 F (36.4 C) (05/03 1213) Pulse Rate:  [57-134] 74 (05/03 1200) Resp:  [13-58] 35 (05/03 1200) BP: (56-258)/(23-145) 146/75 mmHg (05/03 0920) SpO2:  [92 %-100 %] 100 % (05/03 1200) Arterial Line BP: (115)/(59) 115/59 mmHg (05/03 1200) FiO2 (%):  [80 %-100 %] 80 % (05/03 1115) Weight:  [289 lb 7.4 oz (131.3 kg)] 289 lb 7.4 oz (131.3 kg) (05/03 0115) Weight change: -3 lb 4.9 oz (-1.5 kg)  Intake/Output from previous day: 05/02 0701 - 05/03 0700 In: 3848.2 [I.V.:933.9; Blood:1764.3; IV Piggyback:1150] Out: 475 [Urine:475] Intake/Output this shift: Total I/O In: 2924.4 [I.V.:1382.4; Blood:1492; IV  Piggyback:50] Out: -   General appearance: Sedated and intubated Resp: intubated, Coarse breath sounds bilaterally Cardio: RRR GI: S, NT, ND Ext: 1+ bilateral LE edema, feet cool to touch Neuro: Sedated, does not follow commands  Lab Results:  Recent Labs  06/08/15 0537 06/08/15 1023  WBC 45.8* 48.8*  HGB 7.9* 7.1*  HCT 23.5* 21.6*  PLT 268 164   BMET:   Recent Labs  06/08/15 0537 06/08/15 1023  NA 134* 138  K 6.0* 5.3*  CL 100* 102  CO2 14* 12*  GLUCOSE 243* 205*  BUN 65* 64*  CREATININE 6.41* 6.14*  CALCIUM 7.6* 7.0*   No results for input(s): PTH in the last 72 hours. Iron Studies: No results for input(s): IRON, TIBC, TRANSFERRIN, FERRITIN in the last 72 hours. Studies/Results: Ct Abdomen Pelvis Wo Contrast  06/08/2015  CLINICAL DATA:  Decreased hemoglobin and increased abdominal pain. EXAM: CT ABDOMEN AND PELVIS WITHOUT CONTRAST TECHNIQUE: Multidetector CT imaging of the abdomen and pelvis was performed following the standard protocol without IV contrast. COMPARISON:  06/02/2015 FINDINGS: Moderate size bilateral pleural effusions with basilar atelectasis. Effusions are increased since previous study. Large esophageal hiatal hernia behind the heart. Large right perinephric hematoma with hematocrit levels demonstrated. The hematoma surrounds the right kidney and displaces the kidney towards the midline. The hematoma measures about 14.2 x 18 x 20.7 cm. The hematoma is measuring larger than on the previous study suggesting progression. Hematoma extends into the right pericolic gutter and para renal fat Scholl planes. The left kidney is not identified and may be surgically absent. Surgically absent gallbladder. No bile  duct dilatation. Unenhanced appearance of liver, spleen, pancreas, adrenal glands, abdominal aorta, and inferior vena cava are unremarkable. Mild mesenteric infiltration suggesting mild mesenteric hemorrhage. Stomach, small bowel, and colon are not abnormally  distended. No free air in the abdomen. Pelvis: Foley catheter deflects the bladder. Prostate gland is enlarged. Diverticulosis of the sigmoid colon without evidence of diverticulitis. Degenerative changes in the spine and hips. No destructive bone lesions. IMPRESSION: Large right perinephric hematoma demonstrating increase in size since previous study. Bilateral pleural effusions with basilar atelectasis also increasing. Electronically Signed   By: Lucienne Capers M.D.   On: 06/08/2015 00:57   Ir Angiogram Renal Right Selective  06/08/2015  INDICATION: Large acute right perinephric hematoma, requiring cardiopulmonary resuscitation EXAM: IR RENAL SUPRASEL UNILATERAL S+I MODERATE SEDATION; ARTERIOGRAPHY; IR EMBO ART VEN HEMORR LYMPH EXTRAV INC GUIDE ROADMAPPING; IR ULTRASOUND GUIDANCE VASC ACCESS RIGHT MEDICATIONS: 1% lidocaine locally. ANESTHESIA/SEDATION: Patient is already ventilated and sedated. The patient's level of consciousness and vital signs were monitored continuously by radiology nursing throughout the procedure under my direct supervision. CONTRAST:  75cc Isovue 300 FLUOROSCOPY TIME:  Fluoroscopy Time: 5 minutes 30 seconds (1,687 mGy). COMPLICATIONS: None immediate. PROCEDURE: Informed consent was obtained from the patient family following explanation of the procedure, risks, benefits and alternatives. The patient understands, agrees and consents for the procedure. All questions were addressed. A time out was performed prior to the initiation of the procedure. Maximal barrier sterile technique utilized including caps, mask, sterile gowns, sterile gloves, large sterile drape, hand hygiene, and Betadine prep. Under sterile conditions and local anesthesia, ultrasound micropuncture access performed of the patent left common femoral artery. Five fr sheath inserted over a Bentson guidewire. C2 catheter advanced into the right renal artery. Catheter advanced into the main right renal artery over a Glidewire.  Selective right renal angiogram performed. Right renal angiogram: Main right renal artery is patent. Anterior and posterior divisions are patent. Segmental branch to the lower pole demonstrates a small peripheral focal arterial blush / pseudo aneurysm compatible with an active bleeding site. The right renal segmental and intraparenchymal branches demonstrate diffuse vaso constriction related to resuscitation. C2 catheter was advanced into the right renal lower pole segmental branch over a Glidewire. Selective angiogram performed of the right kidney lower pole branch. This is the dominant arterial branch feeding the peripheral right renal bleeding site. Embolization: From this location, 035 3 mm M rey Cook coils (2) and 035 6 mm Nester coils (2) were deployed within the right lower pole segmental branch. Post embolization angiogram confirms complete occlusion of the right lower pole branch. Final right renal angiogram demonstrates no further active bleeding or other sites. Access removed. Left femoral sheet secured externally to an arterial flushed because of coagulopathy. IMPRESSION: Peripheral right kidney lower pole bleeding site localized. Segmental right lower pole feeding artery was catheterized with a 5 French C2 catheter for successful complete coil embolization as above. Electronically Signed   By: Jerilynn Mages.  Shick M.D.   On: 06/08/2015 10:25   Ir Angiogram Follow Up Study  06/08/2015  INDICATION: Large acute right perinephric hematoma, requiring cardiopulmonary resuscitation EXAM: IR RENAL SUPRASEL UNILATERAL S+I MODERATE SEDATION; ARTERIOGRAPHY; IR EMBO ART VEN HEMORR LYMPH EXTRAV INC GUIDE ROADMAPPING; IR ULTRASOUND GUIDANCE VASC ACCESS RIGHT MEDICATIONS: 1% lidocaine locally. ANESTHESIA/SEDATION: Patient is already ventilated and sedated. The patient's level of consciousness and vital signs were monitored continuously by radiology nursing throughout the procedure under my direct supervision. CONTRAST:  75cc  Isovue 300 FLUOROSCOPY TIME:  Fluoroscopy Time: 5  minutes 30 seconds (1,687 mGy). COMPLICATIONS: None immediate. PROCEDURE: Informed consent was obtained from the patient family following explanation of the procedure, risks, benefits and alternatives. The patient understands, agrees and consents for the procedure. All questions were addressed. A time out was performed prior to the initiation of the procedure. Maximal barrier sterile technique utilized including caps, mask, sterile gowns, sterile gloves, large sterile drape, hand hygiene, and Betadine prep. Under sterile conditions and local anesthesia, ultrasound micropuncture access performed of the patent left common femoral artery. Five fr sheath inserted over a Bentson guidewire. C2 catheter advanced into the right renal artery. Catheter advanced into the main right renal artery over a Glidewire. Selective right renal angiogram performed. Right renal angiogram: Main right renal artery is patent. Anterior and posterior divisions are patent. Segmental branch to the lower pole demonstrates a small peripheral focal arterial blush / pseudo aneurysm compatible with an active bleeding site. The right renal segmental and intraparenchymal branches demonstrate diffuse vaso constriction related to resuscitation. C2 catheter was advanced into the right renal lower pole segmental branch over a Glidewire. Selective angiogram performed of the right kidney lower pole branch. This is the dominant arterial branch feeding the peripheral right renal bleeding site. Embolization: From this location, 035 3 mm M rey Cook coils (2) and 035 6 mm Nester coils (2) were deployed within the right lower pole segmental branch. Post embolization angiogram confirms complete occlusion of the right lower pole branch. Final right renal angiogram demonstrates no further active bleeding or other sites. Access removed. Left femoral sheet secured externally to an arterial flushed because of  coagulopathy. IMPRESSION: Peripheral right kidney lower pole bleeding site localized. Segmental right lower pole feeding artery was catheterized with a 5 French C2 catheter for successful complete coil embolization as above. Electronically Signed   By: Jerilynn Mages.  Shick M.D.   On: 06/08/2015 10:25   Ir US Guide Vasc Access Right  06/08/2015  INDICATION: Large acute right perinephric hematoma, requiring cardiopulmonary resuscitation EXAM: IR RENAL SUPRASEL UNILATERAL S+I MODERATE SEDATION; ARTERIOGRAPHY; IR EMBO ART VEN HEMORR LYMPH EXTRAV INC GUIDE ROADMAPPING; IR ULTRASOUND GUIDANCE VASC ACCESS RIGHT MEDICATIONS: 1% lidocaine locally. ANESTHESIA/SEDATION: Patient is already ventilated and sedated. The patient's level of consciousness and vital signs were monitored continuously by radiology nursing throughout the procedure under my direct supervision. CONTRAST:  75cc Isovue 300 FLUOROSCOPY TIME:  Fluoroscopy Time: 5 minutes 30 seconds (1,687 mGy). COMPLICATIONS: None immediate. PROCEDURE: Informed consent was obtained from the patient family following explanation of the procedure, risks, benefits and alternatives. The patient understands, agrees and consents for the procedure. All questions were addressed. A time out was performed prior to the initiation of the procedure. Maximal barrier sterile technique utilized including caps, mask, sterile gowns, sterile gloves, large sterile drape, hand hygiene, and Betadine prep. Under sterile conditions and local anesthesia, ultrasound micropuncture access performed of the patent left common femoral artery. Five fr sheath inserted over a Bentson guidewire. C2 catheter advanced into the right renal artery. Catheter advanced into the main right renal artery over a Glidewire. Selective right renal angiogram performed. Right renal angiogram: Main right renal artery is patent. Anterior and posterior divisions are patent. Segmental branch to the lower pole demonstrates a small peripheral  focal arterial blush / pseudo aneurysm compatible with an active bleeding site. The right renal segmental and intraparenchymal branches demonstrate diffuse vaso constriction related to resuscitation. C2 catheter was advanced into the right renal lower pole segmental branch over a Glidewire. Selective angiogram performed of the  right kidney lower pole branch. This is the dominant arterial branch feeding the peripheral right renal bleeding site. Embolization: From this location, 035 3 mm M rey Cook coils (2) and 035 6 mm Nester coils (2) were deployed within the right lower pole segmental branch. Post embolization angiogram confirms complete occlusion of the right lower pole branch. Final right renal angiogram demonstrates no further active bleeding or other sites. Access removed. Left femoral sheet secured externally to an arterial flushed because of coagulopathy. IMPRESSION: Peripheral right kidney lower pole bleeding site localized. Segmental right lower pole feeding artery was catheterized with a 5 French C2 catheter for successful complete coil embolization as above. Electronically Signed   By: Jerilynn Mages.  Shick M.D.   On: 06/08/2015 10:25   Portable Chest Xray  06/08/2015  CLINICAL DATA:  Hypoxia EXAM: PORTABLE CHEST 1 VIEW COMPARISON:  June 02, 2015 FINDINGS: Endotracheal tube tip is 2.1 cm above the carina. Right central catheter tip is in the superior vena cava. No pneumothorax is evident. However, there is extensive soft tissue air in the left lateral hemithorax region. There is interstitial and patchy alveolar edema throughout the lungs bilaterally, slightly increased. There is cardiomegaly with mild pulmonary venous hypertension. No adenopathy evident. IMPRESSION: Tube and catheter positions as described. There is extensive soft tissue air in the left chest without pneumothorax appreciable on this supine examination. Evidence of underlying congestive heart failure. Superimposed bibasilar pneumonia cannot be  excluded radiographically. Electronically Signed   By: Lowella Grip III M.D.   On: 06/08/2015 08:30   [place] Guide Roadmapping  06/08/2015  INDICATION: Large acute right perinephric hematoma, requiring cardiopulmonary resuscitation EXAM: IR RENAL SUPRASEL UNILATERAL S+I MODERATE SEDATION; ARTERIOGRAPHY; IR EMBO ART VEN HEMORR LYMPH EXTRAV INC GUIDE ROADMAPPING; IR ULTRASOUND GUIDANCE VASC ACCESS RIGHT MEDICATIONS: 1% lidocaine locally. ANESTHESIA/SEDATION: Patient is already ventilated and sedated. The patient's level of consciousness and vital signs were monitored continuously by radiology nursing throughout the procedure under my direct supervision. CONTRAST:  75cc Isovue 300 FLUOROSCOPY TIME:  Fluoroscopy Time: 5 minutes 30 seconds (1,687 mGy). COMPLICATIONS: None immediate. PROCEDURE: Informed consent was obtained from the patient family following explanation of the procedure, risks, benefits and alternatives. The patient understands, agrees and consents for the procedure. All questions were addressed. A time out was performed prior to the initiation of the procedure. Maximal barrier sterile technique utilized including caps, mask, sterile gowns, sterile gloves, large sterile drape, hand hygiene, and Betadine prep. Under sterile conditions and local anesthesia, ultrasound micropuncture access performed of the patent left common femoral artery. Five fr sheath inserted over a Bentson guidewire. C2 catheter advanced into the right renal artery. Catheter advanced into the main right renal artery over a Glidewire. Selective right renal angiogram performed. Right renal angiogram: Main right renal artery is patent. Anterior and posterior divisions are patent. Segmental branch to the lower pole demonstrates a small peripheral focal arterial blush / pseudo aneurysm compatible with an active bleeding site. The right renal segmental and intraparenchymal branches demonstrate diffuse  vaso constriction related to resuscitation. C2 catheter was advanced into the right renal lower pole segmental branch over a Glidewire. Selective angiogram performed of the right kidney lower pole branch. This is the dominant arterial branch feeding the peripheral right renal bleeding site. Embolization: From this location, 035 3 mm M rey Cook coils (2) and 035 6 mm Nester coils (2) were deployed within the right lower pole segmental branch. Post embolization angiogram confirms  complete occlusion of the right lower pole branch. Final right renal angiogram demonstrates no further active bleeding or other sites. Access removed. Left femoral sheet secured externally to an arterial flushed because of coagulopathy. IMPRESSION: Peripheral right kidney lower pole bleeding site localized. Segmental right lower pole feeding artery was catheterized with a 5 French C2 catheter for successful complete coil embolization as above. Electronically Signed   By: Jerilynn Mages.  Shick M.D.   On: 06/08/2015 10:25    Scheduled: . sodium chloride   Intravenous Once  . sodium chloride   Intravenous Once  . sodium chloride   Intravenous Once  . sodium chloride   Intravenous Once  . cefTRIAXone (ROCEPHIN)  IV  2 g Intravenous Q24H  . fentaNYL (SUBLIMAZE) injection  50 mcg Intravenous Once  . insulin aspart  0-15 Units Subcutaneous Q4H  . lidocaine      . midazolam      . pantoprazole  40 mg Oral Daily  . sodium bicarbonate        LOS: 8 days   Dimas Chyle 06/08/2015,2:03 PM   Renal Attending: I agree with note as articulated above.  Pt developed hemorrhagic shock requiring IR intervention.  He developed worsening acidosis. CVVHD reinstitued. .me

## 2015-06-08 NOTE — Progress Notes (Signed)
Notified MD Joelyn Oms with Nephrology in regards to changing CRRT Pre/Dialysate/Post baths due to renal function labs at 1600. New orders placed for CRRT baths 4K/2.5 Calcium. Bicarb infusion stopped per MD Titus Mould. Will continue to monitor and assess.

## 2015-06-09 ENCOUNTER — Inpatient Hospital Stay (HOSPITAL_COMMUNITY): Payer: BLUE CROSS/BLUE SHIELD

## 2015-06-09 DIAGNOSIS — N170 Acute kidney failure with tubular necrosis: Secondary | ICD-10-CM

## 2015-06-09 DIAGNOSIS — Z789 Other specified health status: Secondary | ICD-10-CM

## 2015-06-09 DIAGNOSIS — R7881 Bacteremia: Secondary | ICD-10-CM

## 2015-06-09 DIAGNOSIS — I480 Paroxysmal atrial fibrillation: Secondary | ICD-10-CM

## 2015-06-09 DIAGNOSIS — M549 Dorsalgia, unspecified: Secondary | ICD-10-CM | POA: Insufficient documentation

## 2015-06-09 DIAGNOSIS — R58 Hemorrhage, not elsewhere classified: Secondary | ICD-10-CM | POA: Insufficient documentation

## 2015-06-09 DIAGNOSIS — Z9689 Presence of other specified functional implants: Secondary | ICD-10-CM | POA: Diagnosis present

## 2015-06-09 DIAGNOSIS — B961 Klebsiella pneumoniae [K. pneumoniae] as the cause of diseases classified elsewhere: Secondary | ICD-10-CM

## 2015-06-09 LAB — RENAL FUNCTION PANEL
ALBUMIN: 2.3 g/dL — AB (ref 3.5–5.0)
ALBUMIN: 2 g/dL — AB (ref 3.5–5.0)
Anion gap: 21 — ABNORMAL HIGH (ref 5–15)
Anion gap: 14 (ref 5–15)
BUN: 43 mg/dL — AB (ref 6–20)
BUN: 40 mg/dL — AB (ref 6–20)
CALCIUM: 7.5 mg/dL — AB (ref 8.9–10.3)
CALCIUM: 7.2 mg/dL — AB (ref 8.9–10.3)
CO2: 17 mmol/L — AB (ref 22–32)
CO2: 22 mmol/L (ref 22–32)
CREATININE: 3.69 mg/dL — AB (ref 0.61–1.24)
Chloride: 99 mmol/L — ABNORMAL LOW (ref 101–111)
Chloride: 100 mmol/L — ABNORMAL LOW (ref 101–111)
Creatinine, Ser: 4.08 mg/dL — ABNORMAL HIGH (ref 0.61–1.24)
GFR calc Af Amer: 15 mL/min — ABNORMAL LOW (ref >60)
GFR calc Af Amer: 17 mL/min — ABNORMAL LOW (ref >60)
GFR calc non Af Amer: 13 mL/min — ABNORMAL LOW (ref >60)
GFR, EST NON AFRICAN AMERICAN: 15 mL/min — AB (ref >60)
GLUCOSE: 181 mg/dL — AB (ref 65–99)
Glucose, Bld: 209 mg/dL — ABNORMAL HIGH (ref 65–99)
PHOSPHORUS: 6.6 mg/dL — AB (ref 2.5–4.6)
PHOSPHORUS: 5.9 mg/dL — AB (ref 2.5–4.6)
POTASSIUM: 4.7 mmol/L (ref 3.5–5.1)
Potassium: 5 mmol/L (ref 3.5–5.1)
SODIUM: 137 mmol/L (ref 135–145)
Sodium: 136 mmol/L (ref 135–145)

## 2015-06-09 LAB — CBC
HCT: 27.5 % — ABNORMAL LOW (ref 39.0–52.0)
HEMATOCRIT: 20.5 % — AB (ref 39.0–52.0)
HEMATOCRIT: 26.4 % — AB (ref 39.0–52.0)
Hemoglobin: 9.3 g/dL — ABNORMAL LOW (ref 13.0–17.0)
Hemoglobin: 9.6 g/dL — ABNORMAL LOW (ref 13.0–17.0)
Hemoglobin: 7.1 g/dL — ABNORMAL LOW (ref 13.0–17.0)
MCH: 29 pg (ref 26.0–34.0)
MCH: 29.5 pg (ref 26.0–34.0)
MCH: 28.7 pg (ref 26.0–34.0)
MCHC: 35.2 g/dL (ref 30.0–36.0)
MCHC: 34.9 g/dL (ref 30.0–36.0)
MCHC: 34.6 g/dL (ref 30.0–36.0)
MCV: 82.2 fL (ref 78.0–100.0)
MCV: 84.6 fL (ref 78.0–100.0)
MCV: 83 fL (ref 78.0–100.0)
PLATELETS: 115 10*3/uL — AB (ref 150–400)
PLATELETS: 151 10*3/uL (ref 150–400)
PLATELETS: 153 10*3/uL (ref 150–400)
RBC: 3.21 MIL/uL — ABNORMAL LOW (ref 4.22–5.81)
RBC: 3.25 MIL/uL — AB (ref 4.22–5.81)
RBC: 2.47 MIL/uL — ABNORMAL LOW (ref 4.22–5.81)
RDW: 14.9 % (ref 11.5–15.5)
RDW: 15.4 % (ref 11.5–15.5)
RDW: 15.4 % (ref 11.5–15.5)
WBC: 28.1 10*3/uL — AB (ref 4.0–10.5)
WBC: 43.1 10*3/uL — ABNORMAL HIGH (ref 4.0–10.5)
WBC: 46.4 10*3/uL — AB (ref 4.0–10.5)

## 2015-06-09 LAB — PROCALCITONIN: PROCALCITONIN: 20.49 ng/mL

## 2015-06-09 LAB — COMPREHENSIVE METABOLIC PANEL
ALK PHOS: 107 U/L (ref 38–126)
ALT: 527 U/L — AB (ref 17–63)
AST: 1199 U/L — ABNORMAL HIGH (ref 15–41)
Albumin: 2.2 g/dL — ABNORMAL LOW (ref 3.5–5.0)
Anion gap: 17 — ABNORMAL HIGH (ref 5–15)
BUN: 41 mg/dL — ABNORMAL HIGH (ref 6–20)
CALCIUM: 7.4 mg/dL — AB (ref 8.9–10.3)
CO2: 18 mmol/L — ABNORMAL LOW (ref 22–32)
CREATININE: 3.65 mg/dL — AB (ref 0.61–1.24)
Chloride: 102 mmol/L (ref 101–111)
GFR, EST AFRICAN AMERICAN: 18 mL/min — AB (ref >60)
GFR, EST NON AFRICAN AMERICAN: 15 mL/min — AB (ref >60)
Glucose, Bld: 186 mg/dL — ABNORMAL HIGH (ref 65–99)
Potassium: 4.8 mmol/L (ref 3.5–5.1)
Sodium: 137 mmol/L (ref 135–145)
TOTAL PROTEIN: 4.6 g/dL — AB (ref 6.5–8.1)
Total Bilirubin: 2.2 mg/dL — ABNORMAL HIGH (ref 0.3–1.2)

## 2015-06-09 LAB — POCT I-STAT 3, ART BLOOD GAS (G3+)
ACID-BASE DEFICIT: 5 mmol/L — AB (ref 0.0–2.0)
ACID-BASE DEFICIT: 2 mmol/L (ref 0.0–2.0)
Acid-base deficit: 2 mmol/L (ref 0.0–2.0)
BICARBONATE: 17.3 meq/L — AB (ref 20.0–24.0)
Bicarbonate: 20.1 mEq/L (ref 20.0–24.0)
Bicarbonate: 21.8 mEq/L (ref 20.0–24.0)
O2 SAT: 98 %
O2 SAT: 97 %
O2 SAT: 97 %
PCO2 ART: 33.1 mmHg — AB (ref 35.0–45.0)
PH ART: 7.427 (ref 7.350–7.450)
PO2 ART: 90 mmHg (ref 80.0–100.0)
PO2 ART: 81 mmHg (ref 80.0–100.0)
PO2 ART: 91 mmHg (ref 80.0–100.0)
Patient temperature: 97.3
TCO2: 18 mmol/L (ref 0–100)
TCO2: 21 mmol/L (ref 0–100)
TCO2: 23 mmol/L (ref 0–100)
pCO2 arterial: 22.4 mmHg — ABNORMAL LOW (ref 35.0–45.0)
pCO2 arterial: 23.5 mmHg — ABNORMAL LOW (ref 35.0–45.0)
pH, Arterial: 7.493 — ABNORMAL HIGH (ref 7.350–7.450)
pH, Arterial: 7.541 — ABNORMAL HIGH (ref 7.350–7.450)

## 2015-06-09 LAB — BASIC METABOLIC PANEL
ANION GAP: 12 (ref 5–15)
Anion gap: 18 — ABNORMAL HIGH (ref 5–15)
BUN: 41 mg/dL — ABNORMAL HIGH (ref 6–20)
BUN: 37 mg/dL — AB (ref 6–20)
CALCIUM: 7.4 mg/dL — AB (ref 8.9–10.3)
CALCIUM: 7.3 mg/dL — AB (ref 8.9–10.3)
CHLORIDE: 101 mmol/L (ref 101–111)
CO2: 17 mmol/L — ABNORMAL LOW (ref 22–32)
CO2: 24 mmol/L (ref 22–32)
Chloride: 101 mmol/L (ref 101–111)
Creatinine, Ser: 3.7 mg/dL — ABNORMAL HIGH (ref 0.61–1.24)
Creatinine, Ser: 3.19 mg/dL — ABNORMAL HIGH (ref 0.61–1.24)
GFR calc Af Amer: 17 mL/min — ABNORMAL LOW (ref >60)
GFR calc Af Amer: 21 mL/min — ABNORMAL LOW (ref >60)
GFR, EST NON AFRICAN AMERICAN: 15 mL/min — AB (ref >60)
GFR, EST NON AFRICAN AMERICAN: 18 mL/min — AB (ref >60)
Glucose, Bld: 184 mg/dL — ABNORMAL HIGH (ref 65–99)
Glucose, Bld: 194 mg/dL — ABNORMAL HIGH (ref 65–99)
POTASSIUM: 4.6 mmol/L (ref 3.5–5.1)
POTASSIUM: 4.7 mmol/L (ref 3.5–5.1)
SODIUM: 137 mmol/L (ref 135–145)
Sodium: 136 mmol/L (ref 135–145)

## 2015-06-09 LAB — PREPARE FRESH FROZEN PLASMA
UNIT DIVISION: 0
UNIT DIVISION: 0
Unit division: 0
Unit division: 0
Unit division: 0
Unit division: 0
Unit division: 0

## 2015-06-09 LAB — GLUCOSE, CAPILLARY
GLUCOSE-CAPILLARY: 148 mg/dL — AB (ref 65–99)
GLUCOSE-CAPILLARY: 163 mg/dL — AB (ref 65–99)
GLUCOSE-CAPILLARY: 163 mg/dL — AB (ref 65–99)
GLUCOSE-CAPILLARY: 189 mg/dL — AB (ref 65–99)
Glucose-Capillary: 147 mg/dL — ABNORMAL HIGH (ref 65–99)
Glucose-Capillary: 166 mg/dL — ABNORMAL HIGH (ref 65–99)

## 2015-06-09 LAB — MAGNESIUM
MAGNESIUM: 2.2 mg/dL (ref 1.7–2.4)
MAGNESIUM: 2.1 mg/dL (ref 1.7–2.4)
Magnesium: 2.2 mg/dL (ref 1.7–2.4)

## 2015-06-09 LAB — PHOSPHORUS
Phosphorus: 6.5 mg/dL — ABNORMAL HIGH (ref 2.5–4.6)
Phosphorus: 5.4 mg/dL — ABNORMAL HIGH (ref 2.5–4.6)
Phosphorus: 4.9 mg/dL — ABNORMAL HIGH (ref 2.5–4.6)

## 2015-06-09 LAB — PROTIME-INR
INR: 2.59 — ABNORMAL HIGH (ref 0.00–1.49)
Prothrombin Time: 27.4 seconds — ABNORMAL HIGH (ref 11.6–15.2)

## 2015-06-09 LAB — LACTIC ACID, PLASMA
LACTIC ACID, VENOUS: 10.8 mmol/L — AB (ref 0.5–2.0)
LACTIC ACID, VENOUS: 6.4 mmol/L — AB (ref 0.5–2.0)
Lactic Acid, Venous: 9.9 mmol/L (ref 0.5–2.0)
Lactic Acid, Venous: 3.3 mmol/L (ref 0.5–2.0)

## 2015-06-09 LAB — CORTISOL: CORTISOL PLASMA: 26.8 ug/dL

## 2015-06-09 LAB — PATHOLOGIST SMEAR REVIEW

## 2015-06-09 MED ORDER — PANTOPRAZOLE SODIUM 40 MG IV SOLR
40.0000 mg | INTRAVENOUS | Status: DC
Start: 2015-06-09 — End: 2015-06-15
  Administered 2015-06-09 – 2015-06-15 (×7): 40 mg via INTRAVENOUS
  Filled 2015-06-09 (×7): qty 40

## 2015-06-09 MED ORDER — VITAL HIGH PROTEIN PO LIQD: 1000.0000 mL | ORAL | Status: DC

## 2015-06-09 MED ORDER — PRO-STAT SUGAR FREE PO LIQD
30.0000 mL | Freq: Three times a day (TID) | ORAL | Status: DC
  Administered 2015-06-13 – 2015-06-17 (×12): 30 mL
  Filled 2015-06-09 (×29): qty 30

## 2015-06-09 MED ORDER — DOCUSATE SODIUM 50 MG/5ML PO LIQD
100.0000 mg | Freq: Two times a day (BID) | ORAL | Status: DC
  Administered 2015-06-13 – 2015-06-27 (×18): 100 mg via ORAL
  Filled 2015-06-09 (×39): qty 10

## 2015-06-09 MED ORDER — VITAMIN K1 10 MG/ML IJ SOLN
2.0000 mg | Freq: Once | INTRAMUSCULAR | Status: AC
  Administered 2015-06-09: 2 mg via INTRAVENOUS
  Filled 2015-06-09: qty 0.2

## 2015-06-09 MED ORDER — ANTISEPTIC ORAL RINSE SOLUTION (CORINZ)
7.0000 mL | Freq: Four times a day (QID) | OROMUCOSAL | Status: DC
  Administered 2015-06-09 – 2015-07-10 (×116): 7 mL via OROMUCOSAL

## 2015-06-09 MED ORDER — SODIUM CHLORIDE 0.9 % IV SOLN
Freq: Once | INTRAVENOUS | Status: DC
Start: 2015-06-09 — End: 2015-06-09

## 2015-06-09 MED ORDER — PRO-STAT SUGAR FREE PO LIQD: 30.0000 mL | Freq: Two times a day (BID) | ORAL | Status: DC

## 2015-06-09 MED ORDER — CHLORHEXIDINE GLUCONATE 0.12% ORAL RINSE (MEDLINE KIT)
15.0000 mL | Freq: Two times a day (BID) | OROMUCOSAL | Status: DC
  Administered 2015-06-09 – 2015-07-09 (×61): 15 mL via OROMUCOSAL

## 2015-06-09 MED ORDER — SENNOSIDES 8.8 MG/5ML PO SYRP
5.0000 mL | ORAL_SOLUTION | Freq: Two times a day (BID) | ORAL | Status: DC
  Administered 2015-06-12 – 2015-06-27 (×19): 5 mL via ORAL
  Filled 2015-06-09 (×39): qty 5

## 2015-06-09 NOTE — Progress Notes (Signed)
CRITICAL VALUE ALERT  Critical value received:  Lactic Acid 3.3  Date of notification: 06/09/15  Time of notification:  [id number]  Critical value read back: Yes  Nurse who received alert: Polly Cobia RN   MD notified (1st page): MD Oletta Darter   Time of first page: 1753  MD notified (2nd page):  Time of second page:  Responding MD: Warren Lacy RN [place]   Time MD responded: [phone]

## 2015-06-09 NOTE — Progress Notes (Signed)
RT note-Order to reduce ventilator f-16, per Dr. Titus Mould, ABG in one hour.

## 2015-06-09 NOTE — Progress Notes (Signed)
Assessment/ Plan: Pt is a 75 y.o. yo male who was admitted on 05/31/2015 with septic shock 2/2 klebsiella bacteremia. Readmitted to the ICU 06/08/2015 with hemorrhagic shock in the setting of a right perinephric hematoma. Now s/p coil embolization.  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 Patient's Cr was notably elevated at 9.33, his baseline is thought to be around 1.7-1.9.   1. AKI/CKD, patient s/p left nephrectomy and now s/p partial right renal artery coil embolization. Anuric.  - On CVVHD given tenuous hemodynamic status - goal net even 2. Hemorrhagic shock 2/2 Acute blood loss anemia 2/2 right perinephric hematoma. Also with coagulopathy, s/p FFP and vit K  - s/p coil embolization by IR  - On pressors per CCM  - Trend CBC, transfuse prn  - Trend INR 3. Urosepsis / Positive blood culture pan-sensitive Kleb Pneumo. abx per primary team.  4. Retroperitoneal, perinephric hematoma with displacement of right kidney: No evidence of hydro. No heparin with HD. Urology following, recommends reimaging in about 4 mths.  Renal Attending: Had shock, CPR, bleeding, angiography with contrast dye and coil embolization of branch of renal artery yesterday. All of this poses increased risk of lack of renal recovery in a solitary kidney; fortunately, he made 475cc of UOP in last 24hrs.  We will continue to support with CRRT  [name]  Subjective: Currently intubated. Responds to some commands.    Objective: Vital signs in last 24 hours: Temp:  [94.4 F (34.7 Villegas)-98.4 F (36.9 Villegas)] 97.3 F (36.3 Villegas) (05/04 0339) Pulse Rate:  [29-89] 76 (05/04 0700) Resp:  [16-36] 35 (05/04 0700) BP: (73-168)/(31-145) 88/53 mmHg (05/04 0700) SpO2:  [79 %-100 %] 100 % (05/04 0700) Arterial Line BP: (68-161)/(38-82) 104/52 mmHg (05/04 0700) FiO2 (%):  [50 %-100 %] 50 % (05/04 0324) Weight:  [315  lb 0.6 oz (142.9 kg)] 315 lb 0.6 oz (142.9 kg) (05/04 0500) Weight change: 25 lb 9.2 oz (11.6 kg)  Intake/Output from previous day: 05/03 0701 - 05/04 0700 In: 6010 [I.V.:3461; QQIWL:7989; IV Piggyback:50] Out: 2391 [Chest Tube:120] Intake/Output this shift:    General appearance: Sedated and intubated Resp: intubated, Coarse breath sounds bilaterally, chest tubes in places bilaterally Cardio: RRR GI: S, NT, ND Ext: 1+ bilateral LE edema Neuro: Sedated, Follows some commands  Lab Results:  Recent Labs  06/09/15 0041 06/09/15 0421  WBC 46.4* 43.1*  HGB 9.3* 9.6*  HCT 26.4* 27.5*  PLT 153 151   BMET:   Recent Labs  06/08/15 1600 06/09/15 0500  NA 138 137  K 4.7 5.0  CL 100* 99*  CO2 13* 17*  GLUCOSE 233* 181*  BUN 54* 43*  CREATININE 5.13* 4.08*  CALCIUM 7.1* 7.5*   No results for input(s): PTH in the last 72 hours. Iron Studies: No results for input(s): IRON, TIBC, TRANSFERRIN, FERRITIN in the last 72 hours. Studies/Results: Ct Abdomen Pelvis Wo Contrast  06/08/2015  CLINICAL DATA:  Decreased hemoglobin and increased abdominal pain. EXAM: CT ABDOMEN AND PELVIS WITHOUT CONTRAST TECHNIQUE: Multidetector CT imaging of the abdomen and pelvis was performed following the standard protocol without IV contrast. COMPARISON:  06/02/2015 FINDINGS: Moderate size bilateral pleural effusions with basilar atelectasis. Effusions are increased since previous study. Large esophageal hiatal hernia behind the heart. Large right perinephric hematoma with hematocrit levels demonstrated. The hematoma surrounds the right kidney and displaces the kidney towards the midline. The hematoma measures about 14.2  x 18 x 20.7 cm. The hematoma is measuring larger than on the previous study suggesting progression. Hematoma extends into the right pericolic gutter and para renal fat Scholl planes. The left kidney is not identified and may be surgically absent. Surgically absent gallbladder. No bile duct  dilatation. Unenhanced appearance of liver, spleen, pancreas, adrenal glands, abdominal aorta, and inferior vena cava are unremarkable. Mild mesenteric infiltration suggesting mild mesenteric hemorrhage. Stomach, small bowel, and colon are not abnormally distended. No free air in the abdomen. Pelvis: Foley catheter deflects the bladder. Prostate gland is enlarged. Diverticulosis of the sigmoid colon without evidence of diverticulitis. Degenerative changes in the spine and hips. No destructive bone lesions. IMPRESSION: Large right perinephric hematoma demonstrating increase in size since previous study. Bilateral pleural effusions with basilar atelectasis also increasing. Electronically Signed   By: Lucienne Capers M.D.   On: 06/08/2015 00:57   Ir Angiogram Renal Right Selective  06/08/2015  INDICATION: Large acute right perinephric hematoma, requiring cardiopulmonary resuscitation EXAM: IR RENAL SUPRASEL UNILATERAL S+I MODERATE SEDATION; ARTERIOGRAPHY; IR EMBO ART VEN HEMORR LYMPH EXTRAV INC GUIDE ROADMAPPING; IR ULTRASOUND GUIDANCE VASC ACCESS RIGHT MEDICATIONS: 1% lidocaine locally. ANESTHESIA/SEDATION: Patient is already ventilated and sedated. The patient's level of consciousness and vital signs were monitored continuously by radiology nursing throughout the procedure under my direct supervision. CONTRAST:  75cc Isovue 300 FLUOROSCOPY TIME:  Fluoroscopy Time: 5 minutes 30 seconds (1,687 mGy). COMPLICATIONS: None immediate. PROCEDURE: Informed consent was obtained from the patient family following explanation of the procedure, risks, benefits and alternatives. The patient understands, agrees and consents for the procedure. All questions were addressed. A time out was performed prior to the initiation of the procedure. Maximal barrier sterile technique utilized including caps, mask, sterile gowns, sterile gloves, large sterile drape, hand hygiene, and Betadine prep. Under sterile conditions and local  anesthesia, ultrasound micropuncture access performed of the patent left common femoral artery. Five fr sheath inserted over a Bentson guidewire. C2 catheter advanced into the right renal artery. Catheter advanced into the main right renal artery over a Glidewire. Selective right renal angiogram performed. Right renal angiogram: Main right renal artery is patent. Anterior and posterior divisions are patent. Segmental branch to the lower pole demonstrates a small peripheral focal arterial blush / pseudo aneurysm compatible with an active bleeding site. The right renal segmental and intraparenchymal branches demonstrate diffuse vaso constriction related to resuscitation. C2 catheter was advanced into the right renal lower pole segmental branch over a Glidewire. Selective angiogram performed of the right kidney lower pole branch. This is the dominant arterial branch feeding the peripheral right renal bleeding site. Embolization: From this location, 035 3 mm M rey Cook coils (2) and 035 6 mm Nester coils (2) were deployed within the right lower pole segmental branch. Post embolization angiogram confirms complete occlusion of the right lower pole branch. Final right renal angiogram demonstrates no further active bleeding or other sites. Access removed. Left femoral sheet secured externally to an arterial flushed because of coagulopathy. IMPRESSION: Peripheral right kidney lower pole bleeding site localized. Segmental right lower pole feeding artery was catheterized with a 5 French C2 catheter for successful complete coil embolization as above. Electronically Signed   By: Jerilynn Mages.  Shick M.D.   On: 06/08/2015 10:25   Ir Angiogram Follow Up Study  06/08/2015  INDICATION: Large acute right perinephric hematoma, requiring cardiopulmonary resuscitation EXAM: IR RENAL SUPRASEL UNILATERAL S+I MODERATE SEDATION; ARTERIOGRAPHY; IR EMBO ART VEN HEMORR LYMPH EXTRAV INC GUIDE ROADMAPPING; IR ULTRASOUND GUIDANCE VASC  ACCESS RIGHT  MEDICATIONS: 1% lidocaine locally. ANESTHESIA/SEDATION: Patient is already ventilated and sedated. The patient's level of consciousness and vital signs were monitored continuously by radiology nursing throughout the procedure under my direct supervision. CONTRAST:  75cc Isovue 300 FLUOROSCOPY TIME:  Fluoroscopy Time: 5 minutes 30 seconds (1,687 mGy). COMPLICATIONS: None immediate. PROCEDURE: Informed consent was obtained from the patient family following explanation of the procedure, risks, benefits and alternatives. The patient understands, agrees and consents for the procedure. All questions were addressed. A time out was performed prior to the initiation of the procedure. Maximal barrier sterile technique utilized including caps, mask, sterile gowns, sterile gloves, large sterile drape, hand hygiene, and Betadine prep. Under sterile conditions and local anesthesia, ultrasound micropuncture access performed of the patent left common femoral artery. Five fr sheath inserted over a Bentson guidewire. C2 catheter advanced into the right renal artery. Catheter advanced into the main right renal artery over a Glidewire. Selective right renal angiogram performed. Right renal angiogram: Main right renal artery is patent. Anterior and posterior divisions are patent. Segmental branch to the lower pole demonstrates a small peripheral focal arterial blush / pseudo aneurysm compatible with an active bleeding site. The right renal segmental and intraparenchymal branches demonstrate diffuse vaso constriction related to resuscitation. C2 catheter was advanced into the right renal lower pole segmental branch over a Glidewire. Selective angiogram performed of the right kidney lower pole branch. This is the dominant arterial branch feeding the peripheral right renal bleeding site. Embolization: From this location, 035 3 mm M rey Cook coils (2) and 035 6 mm Nester coils (2) were deployed within the right lower pole segmental branch.  Post embolization angiogram confirms complete occlusion of the right lower pole branch. Final right renal angiogram demonstrates no further active bleeding or other sites. Access removed. Left femoral sheet secured externally to an arterial flushed because of coagulopathy. IMPRESSION: Peripheral right kidney lower pole bleeding site localized. Segmental right lower pole feeding artery was catheterized with a 5 French C2 catheter for successful complete coil embolization as above. Electronically Signed   By: Jerilynn Mages.  Shick M.D.   On: 06/08/2015 10:25   Ir US Guide Vasc Access Right  06/08/2015  INDICATION: Large acute right perinephric hematoma, requiring cardiopulmonary resuscitation EXAM: IR RENAL SUPRASEL UNILATERAL S+I MODERATE SEDATION; ARTERIOGRAPHY; IR EMBO ART VEN HEMORR LYMPH EXTRAV INC GUIDE ROADMAPPING; IR ULTRASOUND GUIDANCE VASC ACCESS RIGHT MEDICATIONS: 1% lidocaine locally. ANESTHESIA/SEDATION: Patient is already ventilated and sedated. The patient's level of consciousness and vital signs were monitored continuously by radiology nursing throughout the procedure under my direct supervision. CONTRAST:  75cc Isovue 300 FLUOROSCOPY TIME:  Fluoroscopy Time: 5 minutes 30 seconds (1,687 mGy). COMPLICATIONS: None immediate. PROCEDURE: Informed consent was obtained from the patient family following explanation of the procedure, risks, benefits and alternatives. The patient understands, agrees and consents for the procedure. All questions were addressed. A time out was performed prior to the initiation of the procedure. Maximal barrier sterile technique utilized including caps, mask, sterile gowns, sterile gloves, large sterile drape, hand hygiene, and Betadine prep. Under sterile conditions and local anesthesia, ultrasound micropuncture access performed of the patent left common femoral artery. Five fr sheath inserted over a Bentson guidewire. C2 catheter advanced into the right renal artery. Catheter advanced  into the main right renal artery over a Glidewire. Selective right renal angiogram performed. Right renal angiogram: Main right renal artery is patent. Anterior and posterior divisions are patent. Segmental branch to the lower pole demonstrates a small peripheral  focal arterial blush / pseudo aneurysm compatible with an active bleeding site. The right renal segmental and intraparenchymal branches demonstrate diffuse vaso constriction related to resuscitation. C2 catheter was advanced into the right renal lower pole segmental branch over a Glidewire. Selective angiogram performed of the right kidney lower pole branch. This is the dominant arterial branch feeding the peripheral right renal bleeding site. Embolization: From this location, 035 3 mm M rey Cook coils (2) and 035 6 mm Nester coils (2) were deployed within the right lower pole segmental branch. Post embolization angiogram confirms complete occlusion of the right lower pole branch. Final right renal angiogram demonstrates no further active bleeding or other sites. Access removed. Left femoral sheet secured externally to an arterial flushed because of coagulopathy. IMPRESSION: Peripheral right kidney lower pole bleeding site localized. Segmental right lower pole feeding artery was catheterized with a 5 French C2 catheter for successful complete coil embolization as above. Electronically Signed   By: Jerilynn Mages.  Shick M.D.   On: 06/08/2015 10:25   Dg Chest Port 1 View  06/09/2015  CLINICAL DATA:  Shortness of breath.  Chest tubes. EXAM: PORTABLE CHEST 1 VIEW COMPARISON:  06/08/2015. FINDINGS: Endotracheal tube, right IJ sheath, bilateral chest tubes in stable position. No pneumothorax identified. Extensive but improving diffuse neck and chest wall subcutaneous emphysema again noted. Stable cardiomegaly. Persistent bilateral airspace disease. No interim change. IMPRESSION: 1. Lines and tubes including bilateral chest tubes in stable position. No pneumothorax.  Extensive but improving diffuse chest wall and neck subcutaneous emphysema. 2. Persistent unchanged bilateral airspace disease. Stable cardiomegaly. Electronically Signed   By: Marcello Moores  Register   On: 06/09/2015 07:37   Dg Chest Port 1 View  06/08/2015  CLINICAL DATA:  Post chest tube insertion EXAM: PORTABLE CHEST 1 VIEW COMPARISON:  06/08/2015 dated 1453 hours FINDINGS: Extensive subcutaneous emphysema overlying the bilateral chest and along the lateral chest wall/axilla. Indwelling bilateral chest tubes.  No definite pneumothorax is seen. Mild patchy opacity at the left lung base, likely compressive atelectasis. Endotracheal tube terminates 3 cm above the carina. Cardiomegaly. Right IJ venous catheter terminates in the upper SVC. IMPRESSION: Indwelling bilateral chest tubes.  No definite pneumothorax is seen. Extensive subcutaneous emphysema. Endotracheal tube terminates 3 cm above the carina. Electronically Signed   By: Julian Hy M.D.   On: 06/08/2015 17:20   Dg Chest Port 1 View  06/08/2015  CLINICAL DATA:  75 year old who underwent transcatheter embolization of a right lower pole renal artery due to acute hemorrhage. Ventilator dependent respiratory failure. Followup subcutaneous emphysema. EXAM: PORTABLE CHEST 1 VIEW 2:53 p.m.: COMPARISON:  Portable chest x-ray earlier today 8:11 a.m. and previously. FINDINGS: Since examination earlier today, marked progression of now severe diffuse subcutaneous emphysema throughout the chest wall and neck. Endotracheal tube tip remains in satisfactory position projecting approximately 3 cm above the carina. Right subclavian central venous catheter tip projects over the upper SVC, unchanged. External pacing pads. Cardiac silhouette mildly to moderately enlarged, unchanged. Interval development of patchy opacities involving the lung bases, right greater than left, since earlier today. Pulmonary vascularity normal. IMPRESSION: 1. Marked progression of now severe  diffuse subcutaneous emphysema throughout the chest wall and neck. 2. Support apparatus satisfactory. 3. Developing atelectasis and/or pneumonia involving the lung bases, right greater than left. 4. Stable cardiomegaly without pulmonary edema. Electronically Signed   By: Evangeline Dakin M.D.   On: 06/08/2015 15:11   Portable Chest Xray  06/08/2015  CLINICAL DATA:  Hypoxia EXAM: PORTABLE CHEST 1 VIEW COMPARISON:  June 02, 2015 FINDINGS: Endotracheal tube tip is 2.1 cm above the carina. Right central catheter tip is in the superior vena cava. No pneumothorax is evident. However, there is extensive soft tissue air in the left lateral hemithorax region. There is interstitial and patchy alveolar edema throughout the lungs bilaterally, slightly increased. There is cardiomegaly with mild pulmonary venous hypertension. No adenopathy evident. IMPRESSION: Tube and catheter positions as described. There is extensive soft tissue air in the left chest without pneumothorax appreciable on this supine examination. Evidence of underlying congestive heart failure. Superimposed bibasilar pneumonia cannot be excluded radiographically. Electronically Signed   By: Lowella Grip III M.D.   On: 06/08/2015 08:30   [place] Guide Roadmapping  06/08/2015  INDICATION: Large acute right perinephric hematoma, requiring cardiopulmonary resuscitation EXAM: IR RENAL SUPRASEL UNILATERAL S+I MODERATE SEDATION; ARTERIOGRAPHY; IR EMBO ART VEN HEMORR LYMPH EXTRAV INC GUIDE ROADMAPPING; IR ULTRASOUND GUIDANCE VASC ACCESS RIGHT MEDICATIONS: 1% lidocaine locally. ANESTHESIA/SEDATION: Patient is already ventilated and sedated. The patient's level of consciousness and vital signs were monitored continuously by radiology nursing throughout the procedure under my direct supervision. CONTRAST:  75cc Isovue 300 FLUOROSCOPY TIME:  Fluoroscopy Time: 5 minutes 30 seconds (1,687 mGy). COMPLICATIONS: None immediate.  PROCEDURE: Informed consent was obtained from the patient family following explanation of the procedure, risks, benefits and alternatives. The patient understands, agrees and consents for the procedure. All questions were addressed. A time out was performed prior to the initiation of the procedure. Maximal barrier sterile technique utilized including caps, mask, sterile gowns, sterile gloves, large sterile drape, hand hygiene, and Betadine prep. Under sterile conditions and local anesthesia, ultrasound micropuncture access performed of the patent left common femoral artery. Five fr sheath inserted over a Bentson guidewire. C2 catheter advanced into the right renal artery. Catheter advanced into the main right renal artery over a Glidewire. Selective right renal angiogram performed. Right renal angiogram: Main right renal artery is patent. Anterior and posterior divisions are patent. Segmental branch to the lower pole demonstrates a small peripheral focal arterial blush / pseudo aneurysm compatible with an active bleeding site. The right renal segmental and intraparenchymal branches demonstrate diffuse vaso constriction related to resuscitation. C2 catheter was advanced into the right renal lower pole segmental branch over a Glidewire. Selective angiogram performed of the right kidney lower pole branch. This is the dominant arterial branch feeding the peripheral right renal bleeding site. Embolization: From this location, 035 3 mm M rey Cook coils (2) and 035 6 mm Nester coils (2) were deployed within the right lower pole segmental branch. Post embolization angiogram confirms complete occlusion of the right lower pole branch. Final right renal angiogram demonstrates no further active bleeding or other sites. Access removed. Left femoral sheet secured externally to an arterial flushed because of coagulopathy. IMPRESSION: Peripheral right kidney lower pole bleeding site localized. Segmental right lower pole feeding  artery was catheterized with a 5 French C2 catheter for successful complete coil embolization as above. Electronically Signed   By: Jerilynn Mages.  Shick M.D.   On: 06/08/2015 10:25    Scheduled: . sodium chloride   Intravenous Once  . sodium chloride   Intravenous Once  . sodium chloride   Intravenous Once  . sodium chloride   Intravenous Once  . antiseptic oral rinse  7 mL Mouth Rinse QID  . cefTRIAXone (ROCEPHIN)  IV  2 g Intravenous Q24H  . chlorhexidine gluconate (SAGE KIT)  15 mL Mouth Rinse BID  .  fentaNYL (SUBLIMAZE) injection  50 mcg Intravenous Once  . insulin aspart  0-15 Units Subcutaneous Q4H  . pantoprazole  40 mg Oral Daily    LOS: 9 days   Caleb Parker 06/09/2015,7:50 AM

## 2015-06-09 NOTE — Progress Notes (Signed)
RT note-ventilator changes post ABG.

## 2015-06-09 NOTE — Progress Notes (Signed)
RT note- ventilator RR decreased post ABG.

## 2015-06-09 NOTE — Progress Notes (Signed)
Supervising Physician: Markus Daft  Patient Status: In-pt  Chief Complaint: Perinephric hemorrhage  Subjective: S/p (R)renal selective lower pole branch embolization yesterday. Interim events noted. (L)groin sheath remains due to coagulopathy Remains intubated/sedated Wife at bedside  Allergies: Review of patient's allergies indicates no known allergies.  Medications:  Current facility-administered medications:  .  0.9 %  sodium chloride infusion, , Intravenous, Continuous, Kara Mead V, MD, 0  at 06/08/15 0300 .  0.9 %  sodium chloride infusion, , Intravenous, Once, Javier Glazier, MD .  0.9 %  sodium chloride infusion, , Intravenous, Once, Javier Glazier, MD .  0.9 %  sodium chloride infusion, , Intravenous, Once, Grace Bushy Minor, NP .  0.9 %  sodium chloride infusion, , Intravenous, Once, [name], MD .  acetaminophen (TYLENOL) tablet 325 mg, 325 mg, Oral, Q6H PRN, Bethena Roys, MD, 325 mg at 06/04/15 2310 .  antiseptic oral rinse solution (CORINZ), 7 mL, Mouth Rinse, QID, [name], MD .  cefTRIAXone (ROCEPHIN) 2 g in dextrose 5 % 50 mL IVPB, 2 g, Intravenous, Q24H, Wynell Balloon, RPH, 2 g at 06/08/15 1201 .  chlorhexidine gluconate (SAGE KIT) (PERIDEX) 0.12 % solution 15 mL, 15 mL, Mouth Rinse, BID, [name], MD, 15 mL at 06/09/15 [phone] .  EPINEPHrine (ADRENALIN) 4 mg in dextrose 5 % 250 mL (0.016 mg/mL) infusion, 0.5-20 mcg/min, Intravenous, Titrated, Jake Church Masters, RPH, Last Rate: 11.3 mL/hr at 06/09/15 0800, 3 mcg/min at 06/09/15 0800 .  fentaNYL (SUBLIMAZE) 2,500 mcg in sodium chloride 0.9 % 250 mL (10 mcg/mL) infusion, 25-400 mcg/hr, Intravenous, Continuous, Juanito Doom, MD, Last Rate: 15 mL/hr at 06/09/15 0700, 150 mcg/hr at 06/09/15 0700 .  fentaNYL (SUBLIMAZE) bolus via infusion 25 mcg, 25 mcg, Intravenous, Q1H PRN, Juanito Doom, MD .  fentaNYL (SUBLIMAZE) injection 50 mcg, 50 mcg, Intravenous, Once,  Juanito Doom, MD .  heparin injection 1,000 Units, 1,000 Units, Intravenous, PRN, Javier Glazier, MD, 1,200 Units at 06/08/15 0545 .  heparin injection 1,000-6,000 Units, 1,000-6,000 Units, CRRT, PRN, Estanislado Emms, MD, 2,400 Units at 06/08/15 [phone] .  insulin aspart (novoLOG) injection 0-15 Units, 0-15 Units, Subcutaneous, Q4H, Kara Mead V, MD, 3 Units at 06/09/15 0830 .  lidocaine (PF) (XYLOCAINE) 1 % injection 5 mL, 5 mL, Intradermal, PRN, Donato Heinz, MD .  lidocaine-prilocaine (EMLA) cream 1 application, 1 application, Topical, PRN, Donato Heinz, MD .  midazolam (VERSED) injection 1-4 mg, 1-4 mg, Intravenous, Q1H PRN, Javier Glazier, MD, 2 mg at 06/09/15 0630 .  midazolam (VERSED) injection, , Intravenous, PRN, Greggory Keen, MD, 2 mg at 06/08/15 0934 .  norepinephrine (LEVOPHED) 16 mg in dextrose 5 % 250 mL (0.064 mg/mL) infusion, 0-40 mcg/min, Intravenous, Titrated, Cherene Altes, MD, Last Rate: 42.2 mL/hr at 06/09/15 0700, 45 mcg/min at 06/09/15 0700 .  ondansetron (ZOFRAN) injection 4 mg, 4 mg, Intravenous, Q6H PRN, Gardiner Barefoot, NP, 4 mg at 06/08/15 0109 .  pantoprazole (PROTONIX) injection 40 mg, 40 mg, Intravenous, Q24H, Jake Church Masters, [facility] .  pentafluoroprop-tetrafluoroeth (GEBAUERS) aerosol 1 application, 1 application, Topical, PRN, Donato Heinz, MD .  prismasol BGK 4/2.5 5,000 mL dialysis replacement fluid, , CRRT, Continuous, Rexene Agent, MD, Last Rate: 300 mL/hr at 06/08/15 1700 .  prismasol BGK 4/2.5 5,000 mL dialysis replacement fluid, , CRRT, Continuous, Rexene Agent, MD, Last Rate: 200 mL/hr at 06/08/15 1700 .  prismasol BGK 4/2.5 5,000 mL dialysis solution, ,  CRRT, Continuous, Rexene Agent, MD, Last Rate: 2,000 mL/hr at 06/09/15 [phone] .  sodium bicarbonate 150 mEq in dextrose 5 % 1,000 mL infusion, , Intravenous, Continuous, Juanito Doom, MD, Stopped at 06/08/15 1700 .  vasopressin (PITRESSIN) 40 Units in sodium chloride 0.9  % 250 mL (0.16 Units/mL) infusion, 0.03 Units/min, Intravenous, Continuous, Cherene Altes, MD, Last Rate: 11.3 mL/hr at 06/09/15 0700, 0.03 Units/min at 06/09/15 0700    Vital Signs: BP 87/74 mmHg  Pulse 71  Temp(Src) 98.4 F (36.9 C) (Axillary)  Resp 35  Ht _0  (1.753 m)  Wt 315 lb 0.6 oz (142.9 kg)  BMI 46.50 kg/m2  SpO2 100%  Physical Exam Intubated/sedated. Bilateral chest tubes to Pleurevac. (L)groin sheath intact to pressure bag, no hematoma (L)LE foot warm, good cap refill   Imaging:  Ir Dale Kincaid Hemorr [place] Guide Roadmapping  06/08/2015  INDICATION: Large acute right perinephric hematoma, requiring cardiopulmonary resuscitation EXAM: IR RENAL SUPRASEL UNILATERAL S+I MODERATE SEDATION; ARTERIOGRAPHY; IR EMBO ART VEN HEMORR LYMPH EXTRAV INC GUIDE ROADMAPPING; IR ULTRASOUND GUIDANCE VASC ACCESS RIGHT MEDICATIONS: 1% lidocaine locally. ANESTHESIA/SEDATION: Patient is already ventilated and sedated. The patient's level of consciousness and vital signs were monitored continuously by radiology nursing throughout the procedure under my direct supervision. CONTRAST:  75cc Isovue 300 FLUOROSCOPY TIME:  Fluoroscopy Time: 5 minutes 30 seconds (1,687 mGy). COMPLICATIONS: None immediate. PROCEDURE: Informed consent was obtained from the patient family following explanation of the procedure, risks, benefits and alternatives. The patient understands, agrees and consents for the procedure. All questions were addressed. A time out was performed prior to the initiation of the procedure. Maximal barrier sterile technique utilized including caps, mask, sterile gowns, sterile gloves, large sterile drape, hand hygiene, and Betadine prep. Under sterile conditions and local anesthesia, ultrasound micropuncture access performed of the patent left common femoral artery. Five fr sheath inserted over a Bentson guidewire. C2 catheter advanced into the right renal artery. Catheter advanced  into the main right renal artery over a Glidewire. Selective right renal angiogram performed. Right renal angiogram: Main right renal artery is patent. Anterior and posterior divisions are patent. Segmental branch to the lower pole demonstrates a small peripheral focal arterial blush / pseudo aneurysm compatible with an active bleeding site. The right renal segmental and intraparenchymal branches demonstrate diffuse vaso constriction related to resuscitation. C2 catheter was advanced into the right renal lower pole segmental branch over a Glidewire. Selective angiogram performed of the right kidney lower pole branch. This is the dominant arterial branch feeding the peripheral right renal bleeding site. Embolization: From this location, 035 3 mm M rey Cook coils (2) and 035 6 mm Nester coils (2) were deployed within the right lower pole segmental branch. Post embolization angiogram confirms complete occlusion of the right lower pole branch. Final right renal angiogram demonstrates no further active bleeding or other sites. Access removed. Left femoral sheet secured externally to an arterial flushed because of coagulopathy. IMPRESSION: Peripheral right kidney lower pole bleeding site localized. Segmental right lower pole feeding artery was catheterized with a 5 French C2 catheter for successful complete coil embolization as above. Electronically Signed   By: Jerilynn Mages.  Shick M.D.   On: 06/08/2015 10:25    Labs:  CBC:  Recent Labs  06/08/15 1023 06/08/15 1954 06/09/15 0041 06/09/15 0421  WBC 48.8* 58.5* 46.4* 43.1*  HGB 7.1* 9.7* 9.3* 9.6*  HCT 21.6* 27.5* 26.4* 27.5*  PLT 164 158 153 151  COAGS:  Recent Labs  06/06/15 0210 06/08/15 0615 06/08/15 1023 06/08/15 1954  INR 2.04* 2.80* 3.43* 2.77*  APTT 31 35 47* 33    BMP:  Recent Labs  06/08/15 0537 06/08/15 1023 06/08/15 1600 06/09/15 0500  NA 134* 138 138 137  K 6.0* 5.3* 4.7 5.0  CL 100* 102 100* 99*  CO2 14* 12* 13* 17*  GLUCOSE  243* 205* 233* 181*  BUN 65* 64* 54* 43*  CALCIUM 7.6* 7.0* 7.1* 7.5*  CREATININE 6.41* 6.14* 5.13* 4.08*  GFRNONAA 8* 8* 10* 13*  GFRAA 9* 9* 12* 15*    LIVER FUNCTION TESTS:  Recent Labs  04/29/15 1545 06/01/15 1043  06/06/15 0210 06/07/15 0530 06/08/15 1600 06/09/15 0500  BILITOT 0.7 1.5*  --  0.6  --   --   --   AST 26  --   --  18  --   --   --   ALT 20  --   --  15*  --   --   --   ALKPHOS 29*  --   --  64  --   --   --   PROT 6.4*  --   --  5.2*  --   --   --   ALBUMIN 3.6  --   < > 1.9*  1.8* 1.7* 2.0* 2.3*  < > = values in this interval not displayed.  Assessment and Plan: (R)perinephric hemorrhage/hematoma s/p selective lower pole branch embolization 5/3. Last INR 2.77, rechecking this am Hopefully if INR down to normal or close to normal, can remove (L)femoral sheath IR will follow along  Electronically Signed: Ascencion Dike 06/09/2015, 9:21 AM   I spent a total of 15 Minutes at the the patient's bedside AND on the patient's hospital floor or unit, greater than 50% of which was counseling/coordinating care for selective renal embolization

## 2015-06-09 NOTE — Care Management Note (Signed)
Case Management Note  Patient Details  Name: [name] MRN: [id number] Date of Birth: October 31, 1940  Subjective/Objective:    Pt originally admitted with septic shock - then found to have perinephric hematoma                 Action/Plan:  Pt is from home with wife - now intubated s/p IR procedure.  CM will continue to monitor for disposition needs   Expected Discharge Date:                  Expected Discharge Plan:  Home/Self Care  In-House Referral:     Discharge planning Services  CM Consult  Post Acute Care Choice:    Choice offered to:     DME Arranged:    DME Agency:     HH Arranged:    HH Agency:     Status of Service:  In process, will continue to follow  Medicare Important Message Given:    Date Medicare IM Given:    Medicare IM give by:    Date Additional Medicare IM Given:    Additional Medicare Important Message give by:     If discussed at [place] of Stay Meetings, dates discussed:    Additional Comments:  Maryclare Labrador, RN 06/09/2015, 11:17 AM

## 2015-06-09 NOTE — Progress Notes (Signed)
Nutrition Follow-up  DOCUMENTATION CODES:   Obesity unspecified  INTERVENTION:   Initiate Vital High Protein at 25 ml/hr and increase to goal rate of 65 ml/hr (1560 ml) at 6 am the following day.  Prostat TID. Each supplement provides 100 kcals and 15 grams of protein.  This provides 1860 kcals and 181 grams of protein which meets 100% of needs.  NUTRITION DIAGNOSIS:   Inadequate oral intake related to inability to eat as evidenced by NPO status.  Ongoing  GOAL:   Patient will meet greater than or equal to 90% of their needs  Unmet  MONITOR:   Vent status, Labs, Weight trends, Skin, I & O's  ASSESSMENT:   75 y.o. male with PMH as outlined below including prostate CA s/p radiation 2011 and RCC s/p left nephrectomy. He was seen in ED 4/22 for clogged catheter which was irrigated and replaced. He apparently has had hematuria for quite some time which has been attributed to scarring from prior radiation.  Significant events: 4/25 admit with septic shock due to UTI + ARF  5/2 admitted to ICU 5/3 intubated, cardiac arrest, resuscitated, CRRT initiated  Pt remains on vent. Initiate TF.  Temp (24hrs), Avg:97.8 F (36.6 C), Min:96.8 F (36 C), Max:98.9 F (37.2 C) VM 20.5 ml  NFPE: No fat depletion, no muscle depletion, unable to assess edema.   Labs reviewed; Na 134, K 6.0, Cl 100, BUN 41, creatinine 3.65, Ca 7.4, GFR 15, CBGs 147-166, phos 5.4. Meds reviewed.   Diet Order:  Diet NPO time specified  Skin:  Reviewed, no issues  Last BM:  4/30  Height:   Ht Readings from Last 1 Encounters:  06/08/15 5\' 9"  (1.753 m)    Weight:   Wt Readings from Last 1 Encounters:  06/09/15 315 lb 0.6 oz (142.9 kg)    Ideal Body Weight:  72.7 kg  BMI:  Body mass index is 46.5 kg/(m^2).  Estimated Nutritional Needs:   Kcal:  [id number]  Protein:  181 g  Fluid:  1.5-1.7L  EDUCATION NEEDS:   No education needs identified at this time  Geoffery Lyons,  [place] Dietetic Intern Pager [phone]

## 2015-06-09 NOTE — Progress Notes (Signed)
[facility] Progress Note Patient Name: LOGANN FLAM DOB: 05/22/1940 MRN: [id number]   Date of Service  06/09/2015  HPI/Events of Note  R chest tube became disconnected during a bath. Sat now 100%. Chest tube now back to suction.   eICU Interventions  Will order portable CXR to be sure that lung is re-expanded.      Intervention Category Intermediate Interventions: Respiratory distress - evaluation and management  Sommer,Steven Eugene 06/09/2015, 8:40 PM

## 2015-06-09 NOTE — Progress Notes (Signed)
Noted during bath that pts that RIGHT chest tube was disconnected at chest tube connection to where pleural vac attaches to chest tube. Chest tube was immediately reconnected and re-taped. Chest tube drained 140cc of serosanguinous drainage. No air leak noted in pleural vac. Warren Lacy MD called and notified.

## 2015-06-09 NOTE — Progress Notes (Addendum)
PULMONARY / CRITICAL CARE MEDICINE   Name: [name] MRN: [id number] DOB: 02/14/1940    ADMISSION DATE:  05/31/2015 CONSULTATION DATE:  4/25  REFERRING MD:  EDP  CHIEF COMPLAINT:  Hypotension   HISTORY OF PRESENT ILLNESS:   75yo male with hx PE, OSA, CKD III (L nephrectomy r/t RCC), DM initially admitted 4/25 with sepsis r/t UTI.  Admitted by PCCM, requiring vasopressors.  He improved slowly and was tx out to med-surg on 4/30.  On 5/2 developed R flank pain and CT abd/pelvis revealed increased R perinephric hematoma worsened by heparin gtt and pt developed worsening hypotension requiring tx back to ICU and vasopressors.  Pt continued to deteriorate overnight and ultimately had cardiac arrest am 5/3 with approx 12 mins CPR.  Multiple blood products given.  Pt taken to IR for embolization of R perinephric hematoma once stabilized.  Course now c/b AKI on CKD requiring CVVHD, bilat chest tubes r/t significant sub-q emphysema post CPR.    SUBJECTIVE:  Remains on epi gtt - weaning down.  On CVVHD.  Min airleak L chest tube, no air leak R.   VITAL SIGNS: BP 87/74 mmHg  Pulse 71  Temp(Src) 98.4 F (36.9 C) (Axillary)  Resp 35  Ht 5\' 9"  (1.753 m)  Wt 142.9 kg (315 lb 0.6 oz)  BMI 46.50 kg/m2  SpO2 100%  HEMODYNAMICS: CVP:  [9 mmHg-10 mmHg] 10 mmHg  VENTILATOR SETTINGS: Vent Mode:  [-] PRVC FiO2 (%):  [50 %-80 %] 50 % Set Rate:  [30 bmp-35 bmp] 30 bmp Vt Set:  [570 mL] 570 mL PEEP:  [5 cmH20] 5 cmH20 Plateau Pressure:  [20 cmH20-22 cmH20] 20 cmH20  INTAKE / OUTPUT: I/O last 3 completed shifts: In: 8934.8 [I.V.:4181.4; Blood:3603.3; IV Piggyback:1150] Out: 2466 [Urine:75; LQ:1409369; Chest Tube:120]  PHYSICAL EXAMINATION: General:  Chronically ill appearing male, acutely ill  Neuro:  Sedated on vent, RASS -1, opens eyes to command  HEENT:  Mm moist, no JVD, ETT  Cardiovascular:  s1s2 rrr Lungs:  resps even non labored on vent, diminished bases, few scattered rhonchi,  extensive sub-q air/crepitus, bilat chest tubes - R with serosang drainage, L with 1/6 airleak  Abdomen:  Round, distended, hypoactive bs  Musculoskeletal:  Warm and dry, 1+ BLE edema   LABS:  BMET  Recent Labs Lab 06/08/15 1023 06/08/15 1600 06/09/15 0500  NA 138 138 137  K 5.3* 4.7 5.0  CL 102 100* 99*  CO2 12* 13* 17*  BUN 64* 54* 43*  CREATININE 6.14* 5.13* 4.08*  GLUCOSE 205* 233* 181*    Electrolytes  Recent Labs Lab 06/08/15 1023 06/08/15 1600 06/09/15 0421 06/09/15 0500  CALCIUM 7.0* 7.1*  --  7.5*  MG  --   --  2.2  --   PHOS  --  7.9* 6.5* 6.6*    CBC  Recent Labs Lab 06/08/15 1954 06/09/15 0041 06/09/15 0421  WBC 58.5* 46.4* 43.1*  HGB 9.7* 9.3* 9.6*  HCT 27.5* 26.4* 27.5*  PLT 158 153 151    Coag's  Recent Labs Lab 06/08/15 0615 06/08/15 1023 06/08/15 1954  APTT 35 47* 33  INR 2.80* 3.43* 2.77*    Sepsis Markers  Recent Labs Lab 06/08/15 1852 06/09/15 0139 06/09/15 0400  LATICACIDVEN 12.1* 10.8* 9.9*    ABG  Recent Labs Lab 06/08/15 1450 06/08/15 1942 06/09/15 0803  PHART 7.428 7.444 7.493*  PCO2ART 24.8* 20.9* 22.4*  PO2ART 277.0* 131.0* 90.0    Liver Enzymes  Recent Labs Lab 06/06/15  0210 06/07/15 0530 06/08/15 1600 06/09/15 0500  AST 18  --   --   --   ALT 15*  --   --   --   ALKPHOS 64  --   --   --   BILITOT 0.6  --   --   --   ALBUMIN 1.9*  1.8* 1.7* 2.0* 2.3*    Cardiac Enzymes  Recent Labs Lab 06/08/15 0808 06/08/15 1410 06/08/15 1852  TROPONINI 0.08* 0.32* 0.27*    Glucose  Recent Labs Lab 06/08/15 1211 06/08/15 1552 06/08/15 1953 06/08/15 2334 06/09/15 0336 06/09/15 0821  GLUCAP 225* 175* 181* 163* 147* 166*    Imaging Dg Chest Port 1 View  06/09/2015  CLINICAL DATA:  Shortness of breath.  Chest tubes. EXAM: PORTABLE CHEST 1 VIEW COMPARISON:  06/08/2015. FINDINGS: Endotracheal tube, right IJ sheath, bilateral chest tubes in stable position. No pneumothorax identified.  Extensive but improving diffuse neck and chest wall subcutaneous emphysema again noted. Stable cardiomegaly. Persistent bilateral airspace disease. No interim change. IMPRESSION: 1. Lines and tubes including bilateral chest tubes in stable position. No pneumothorax. Extensive but improving diffuse chest wall and neck subcutaneous emphysema. 2. Persistent unchanged bilateral airspace disease. Stable cardiomegaly. Electronically Signed   By: Marcello Moores  Register   On: 06/09/2015 07:37   Dg Chest Port 1 View  06/08/2015  CLINICAL DATA:  Post chest tube insertion EXAM: PORTABLE CHEST 1 VIEW COMPARISON:  06/08/2015 dated 1453 hours FINDINGS: Extensive subcutaneous emphysema overlying the bilateral chest and along the lateral chest wall/axilla. Indwelling bilateral chest tubes.  No definite pneumothorax is seen. Mild patchy opacity at the left lung base, likely compressive atelectasis. Endotracheal tube terminates 3 cm above the carina. Cardiomegaly. Right IJ venous catheter terminates in the upper SVC. IMPRESSION: Indwelling bilateral chest tubes.  No definite pneumothorax is seen. Extensive subcutaneous emphysema. Endotracheal tube terminates 3 cm above the carina. Electronically Signed   By: Julian Hy M.D.   On: 06/08/2015 17:20   Dg Chest Port 1 View  06/08/2015  CLINICAL DATA:  75 year old who underwent transcatheter embolization of a right lower pole renal artery due to acute hemorrhage. Ventilator dependent respiratory failure. Followup subcutaneous emphysema. EXAM: PORTABLE CHEST 1 VIEW 2:53 p.m.: COMPARISON:  Portable chest x-ray earlier today 8:11 a.m. and previously. FINDINGS: Since examination earlier today, marked progression of now severe diffuse subcutaneous emphysema throughout the chest wall and neck. Endotracheal tube tip remains in satisfactory position projecting approximately 3 cm above the carina. Right subclavian central venous catheter tip projects over the upper SVC, unchanged. External  pacing pads. Cardiac silhouette mildly to moderately enlarged, unchanged. Interval development of patchy opacities involving the lung bases, right greater than left, since earlier today. Pulmonary vascularity normal. IMPRESSION: 1. Marked progression of now severe diffuse subcutaneous emphysema throughout the chest wall and neck. 2. Support apparatus satisfactory. 3. Developing atelectasis and/or pneumonia involving the lung bases, right greater than left. 4. Stable cardiomegaly without pulmonary edema. Electronically Signed   By: Evangeline Dakin M.D.   On: 06/08/2015 15:11     STUDIES:  CXR 04/25 > Low volumes. LLL opacity may reflect atelectasis or infiltrate. Renal US 04/25 > surgically absent left kidney, no hydronephrosis, small right renal cyst. CT A / P 05/02 > large right perinephric hematoma demonstrating increase in size since previous study.  CULTURES: Blood 04/25 > Klebsiella  Urine 04/25 > Multiple species present MRSA nares > negative Blood 04/27 > NGTD  ANTIBIOTICS: Ceftaz 04/25 > Vanc 04/25 > 04/25  Ceftriaxone 04/29 >  SIGNIFICANT EVENTS: 04/25 > admitted with septic shock due to UTI. Significant ARF. 04/30 > transferred out of ICU. 05/03 > transferred back to ICU due to acute blood loss anemia presumed due to worsening right perinephric hematoma, hypotension. 5/3>> cardiac arrest  5/3>> IR embolization R perinephric hematoma  5/3- PTX left , bilateral chest tubes for massive sub q air - from cpr  LINES/TUBES: R IJ HD cath 04/26 > ETT 5/3>>>  L femoral CVL 5/3>>> L femoral sheath 5/3>>> L chest tube 5/3>>> R chest tube 5/3>>>  DISCUSSION: 75 y.o. M with hx RCC and prostate CA s/p radiation 2011 and left nephrectomy with increased INR, ARF, perinephric hematoma.  ASSESSMENT / PLAN:  CARDIOVASCULAR A:  Hemorrhagic Shock - right perinephric hematoma +/- sepsis r/t klebsiella UTI  Hx a.fib (on coumadin), HLD, HTN. P:  Hold all anticoagulation  Has  received total 4 PRBC, 15 units FFP, DDAVP, Vit K Trend lactate down Epi gtt - off, no restart  Net even on CVVHD  Levophed as needed to maintain goal MAP > 60 Hold outpatient carvedilol, cardizem, furosemide, niacin, quinapril, coumadin cvp 10  PULMONARY A: Acute hypoxic respiratory failure. 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 Respiratory alkalosis  Extensive Sub-q emphysema - s/p bilat chest tubes  P:  Vent support - 8cc/kg  F/u CXR  F/u ABG Decrease RR 28, repeat abg NO SBT  HEMATOLOGIC / ONCOLOGIC A:  Acute blood loss anemia/hemorrhagic shock - worsening / increase in size of right perinephric hematoma - s/p IR embolization  Coagulopathy - s/p multiple units FFP and vitamin K. Hx prostate CA s/p radiation 2011, RCC s/p left nephrectomy, hx DVT / PE (on coumadin) leukocytosis P:  F/u CBC q12h Continue hold heparin, coumadin  SCD's  Repeat INR now prior to sheath removal  Vit k low dose  RENAL A:  Acute renal failure - started on temp HD this admission. CKD III -- baseline Scr ~1.9 Chronic foley w/hematuria at admission - improving, likely 2/2 infx Perinephric hematoma - etiology could be benign cause, idiopathic or recurrence of malignancy given his RCC hx; increased in size 05/02. P:  Cont CVVHD per renal  Keep volume net even for now  F/u chem   INFECTIOUS A:  Septic shock - due to klebsiella UTI/ bacteremia. Leukocytosis -- LIKELY all related to bleed P:  Continue ceftriaxone. Trend pct   GASTROINTESTINAL A:  Nutrition. P:  NPO for now Nurses unable to pass NG tube -- consider placement coretrak feeding tube for TF when more stable   ENDOCRINE A:  DM. P:  Continue SSI.  NEUROLOGIC A:  Pain Sedation needs on vent  Follows commands - mental status encouraging s/p arrest/ongoing shock  P:  Fentanyl gtt  PRN versed  Rass goal -2    FAMILY  - Updates:  Updated wife at  length at bedside.    - Inter-disciplinary family meet or Palliative Care meeting due by:  day [place], NP 06/09/2015  9:27 AM Pager: [phone] or [phone]   STAFF NOTE: Linwood Dibbles, MD FACP have personally reviewed patient's available data, including medical history, events of note, physical examination and test results as part of my evaluation. I have discussed with resident/NP and other care providers such as pharmacist, RN and RRT. In addition, I personally evaluated patient and elicited key findings of: remains very sick, epi is off, levo to masp  6, vaso remains until levo to 5 mics, does fc, ABG reviewed, reduce MV repeat abg, no sbt with high MV, chest tubes to remain to suction, leak on left remains, cvvhd, add mucomyst, feed, cbc to q12h, low dose vit K , repeat coags in am, WUA minimum, i updated wife in full, the WBC is likley related to bleed, pct to assess, keep current abx , cortisol then stress roids The patient is critically ill with multiple organ systems failure and requires high complexity decision making for assessment and support, frequent evaluation and titration of therapies, application of advanced monitoring technologies and extensive interpretation of multiple databases.   Critical Care Time devoted to patient care services described in this note is30 Minutes. This time reflects time of care of this signee: Merrie Roof, MD FACP. This critical care time does not reflect procedure time, or teaching time or supervisory time of PA/NP/Med student/Med Resident etc but could involve care discussion time. Rest per NP/medical resident whose note is outlined above and that I agree with   Lavon Paganini. Titus Mould, MD, [place] Pgr: [facility] 06/09/2015 10:51 AM

## 2015-06-10 ENCOUNTER — Inpatient Hospital Stay (HOSPITAL_COMMUNITY): Payer: BLUE CROSS/BLUE SHIELD

## 2015-06-10 LAB — CBC
HCT: 22 % — ABNORMAL LOW (ref 39.0–52.0)
HEMATOCRIT: 22.9 % — AB (ref 39.0–52.0)
HEMOGLOBIN: 7.8 g/dL — AB (ref 13.0–17.0)
HEMOGLOBIN: 7.3 g/dL — AB (ref 13.0–17.0)
MCH: 29 pg (ref 26.0–34.0)
MCH: 28.6 pg (ref 26.0–34.0)
MCHC: 34.1 g/dL (ref 30.0–36.0)
MCHC: 33.2 g/dL (ref 30.0–36.0)
MCV: 85.1 fL (ref 78.0–100.0)
MCV: 86.3 fL (ref 78.0–100.0)
PLATELETS: 119 10*3/uL — AB (ref 150–400)
Platelets: 128 10*3/uL — ABNORMAL LOW (ref 150–400)
RBC: 2.69 MIL/uL — ABNORMAL LOW (ref 4.22–5.81)
RBC: 2.55 MIL/uL — AB (ref 4.22–5.81)
RDW: 16 % — AB (ref 11.5–15.5)
RDW: 16.3 % — ABNORMAL HIGH (ref 11.5–15.5)
WBC: 31.3 10*3/uL — ABNORMAL HIGH (ref 4.0–10.5)
WBC: 24.4 10*3/uL — AB (ref 4.0–10.5)

## 2015-06-10 LAB — RENAL FUNCTION PANEL
ALBUMIN: 2.1 g/dL — AB (ref 3.5–5.0)
ANION GAP: 11 (ref 5–15)
ANION GAP: 9 (ref 5–15)
Albumin: 2 g/dL — ABNORMAL LOW (ref 3.5–5.0)
BUN: 34 mg/dL — AB (ref 6–20)
BUN: 29 mg/dL — ABNORMAL HIGH (ref 6–20)
CALCIUM: 7.5 mg/dL — AB (ref 8.9–10.3)
CHLORIDE: 105 mmol/L (ref 101–111)
CO2: 26 mmol/L (ref 22–32)
CO2: 23 mmol/L (ref 22–32)
Calcium: 7.4 mg/dL — ABNORMAL LOW (ref 8.9–10.3)
Chloride: 101 mmol/L (ref 101–111)
Creatinine, Ser: 2.88 mg/dL — ABNORMAL HIGH (ref 0.61–1.24)
Creatinine, Ser: 2.4 mg/dL — ABNORMAL HIGH (ref 0.61–1.24)
GFR calc Af Amer: 23 mL/min — ABNORMAL LOW (ref >60)
GFR calc non Af Amer: 25 mL/min — ABNORMAL LOW (ref >60)
GFR, EST AFRICAN AMERICAN: 29 mL/min — AB (ref >60)
GFR, EST NON AFRICAN AMERICAN: 20 mL/min — AB (ref >60)
GLUCOSE: 176 mg/dL — AB (ref 65–99)
Glucose, Bld: 163 mg/dL — ABNORMAL HIGH (ref 65–99)
PHOSPHORUS: 4.4 mg/dL (ref 2.5–4.6)
POTASSIUM: 4.7 mmol/L (ref 3.5–5.1)
POTASSIUM: 4.6 mmol/L (ref 3.5–5.1)
Phosphorus: 3.9 mg/dL (ref 2.5–4.6)
SODIUM: 138 mmol/L (ref 135–145)
Sodium: 137 mmol/L (ref 135–145)

## 2015-06-10 LAB — BASIC METABOLIC PANEL
ANION GAP: 11 (ref 5–15)
Anion gap: 10 (ref 5–15)
BUN: 32 mg/dL — AB (ref 6–20)
BUN: 28 mg/dL — ABNORMAL HIGH (ref 6–20)
CALCIUM: 7.4 mg/dL — AB (ref 8.9–10.3)
CHLORIDE: 104 mmol/L (ref 101–111)
CHLORIDE: 103 mmol/L (ref 101–111)
CO2: 23 mmol/L (ref 22–32)
CO2: 22 mmol/L (ref 22–32)
CREATININE: 2.58 mg/dL — AB (ref 0.61–1.24)
Calcium: 7.6 mg/dL — ABNORMAL LOW (ref 8.9–10.3)
Creatinine, Ser: 2.33 mg/dL — ABNORMAL HIGH (ref 0.61–1.24)
GFR calc non Af Amer: 23 mL/min — ABNORMAL LOW (ref >60)
GFR calc non Af Amer: 26 mL/min — ABNORMAL LOW (ref >60)
GFR, EST AFRICAN AMERICAN: 27 mL/min — AB (ref >60)
GFR, EST AFRICAN AMERICAN: 30 mL/min — AB (ref >60)
GLUCOSE: 163 mg/dL — AB (ref 65–99)
Glucose, Bld: 160 mg/dL — ABNORMAL HIGH (ref 65–99)
POTASSIUM: 4.9 mmol/L (ref 3.5–5.1)
Potassium: 4.7 mmol/L (ref 3.5–5.1)
SODIUM: 136 mmol/L (ref 135–145)
Sodium: 137 mmol/L (ref 135–145)

## 2015-06-10 LAB — GLUCOSE, CAPILLARY
GLUCOSE-CAPILLARY: 161 mg/dL — AB (ref 65–99)
GLUCOSE-CAPILLARY: 155 mg/dL — AB (ref 65–99)
Glucose-Capillary: 195 mg/dL — ABNORMAL HIGH (ref 65–99)
Glucose-Capillary: 174 mg/dL — ABNORMAL HIGH (ref 65–99)
Glucose-Capillary: 158 mg/dL — ABNORMAL HIGH (ref 65–99)
Glucose-Capillary: 118 mg/dL — ABNORMAL HIGH (ref 65–99)

## 2015-06-10 LAB — PREPARE FRESH FROZEN PLASMA
UNIT DIVISION: 0
Unit division: 0

## 2015-06-10 LAB — CBC WITH DIFFERENTIAL/PLATELET
BASOS ABS: 0 10*3/uL (ref 0.0–0.1)
Basophils Relative: 0 %
EOS ABS: 0 10*3/uL (ref 0.0–0.7)
Eosinophils Relative: 0 %
HCT: 24.2 % — ABNORMAL LOW (ref 39.0–52.0)
HEMOGLOBIN: 8.1 g/dL — AB (ref 13.0–17.0)
LYMPHS PCT: 3 %
Lymphs Abs: 0.7 10*3/uL (ref 0.7–4.0)
MCH: 28.5 pg (ref 26.0–34.0)
MCHC: 33.5 g/dL (ref 30.0–36.0)
MCV: 85.2 fL (ref 78.0–100.0)
MONOS PCT: 5 %
Monocytes Absolute: 1.2 10*3/uL — ABNORMAL HIGH (ref 0.1–1.0)
NEUTROS ABS: 22.7 10*3/uL — AB (ref 1.7–7.7)
NEUTROS PCT: 92 %
PLATELETS: 115 10*3/uL — AB (ref 150–400)
RBC: 2.84 MIL/uL — ABNORMAL LOW (ref 4.22–5.81)
RDW: 16.1 % — ABNORMAL HIGH (ref 11.5–15.5)
WBC: 24.6 10*3/uL — ABNORMAL HIGH (ref 4.0–10.5)

## 2015-06-10 LAB — POCT I-STAT 3, ART BLOOD GAS (G3+)
Acid-Base Excess: 1 mmol/L (ref 0.0–2.0)
Bicarbonate: 25.3 mEq/L — ABNORMAL HIGH (ref 20.0–24.0)
O2 SAT: 99 %
PCO2 ART: 39 mmHg (ref 35.0–45.0)
PH ART: 7.42 (ref 7.350–7.450)
PO2 ART: 124 mmHg — AB (ref 80.0–100.0)
Patient temperature: 98.2
TCO2: 27 mmol/L (ref 0–100)

## 2015-06-10 LAB — PROTIME-INR
INR: 2.23 — AB (ref 0.00–1.49)
Prothrombin Time: 24.5 seconds — ABNORMAL HIGH (ref 11.6–15.2)

## 2015-06-10 LAB — MAGNESIUM
MAGNESIUM: 2.4 mg/dL (ref 1.7–2.4)
Magnesium: 2.2 mg/dL (ref 1.7–2.4)

## 2015-06-10 LAB — PHOSPHORUS: PHOSPHORUS: 4.2 mg/dL (ref 2.5–4.6)

## 2015-06-10 LAB — PROCALCITONIN: PROCALCITONIN: 14.32 ng/mL

## 2015-06-10 MED ORDER — SODIUM CHLORIDE 0.9% FLUSH
10.0000 mL | Freq: Two times a day (BID) | INTRAVENOUS | Status: DC
  Administered 2015-06-11: 20 mL
  Administered 2015-06-11 – 2015-06-17 (×11): 10 mL
  Administered 2015-06-18: 20 mL
  Administered 2015-06-18 – 2015-06-20 (×5): 10 mL
  Administered 2015-06-21: 20 mL
  Administered 2015-06-21 – 2015-07-11 (×31): 10 mL

## 2015-06-10 MED ORDER — SODIUM CHLORIDE 0.9 % IV BOLUS (SEPSIS)
500.0000 mL | Freq: Once | INTRAVENOUS | Status: AC
  Administered 2015-06-10: 500 mL via INTRAVENOUS

## 2015-06-10 MED ORDER — SODIUM CHLORIDE 0.9% FLUSH
10.0000 mL | INTRAVENOUS | Status: DC | PRN
  Administered 2015-06-19 – 2015-07-09 (×2): 10 mL
  Filled 2015-06-10 (×2): qty 40

## 2015-06-10 NOTE — Progress Notes (Signed)
Referring Physician(s): Dr Merrie Roof  Supervising Physician: Aletta Edouard  Patient Status: In-pt  Chief Complaint:  Perinephric hematoma Renal artery embolization 5/3   Subjective:  Intubated In ICU No response Now with renal failure -- temp HD cath   Allergies: Review of patient's allergies indicates no known allergies.  Medications: Prior to Admission medications   Medication Sig Start Date End Date Taking? Authorizing Provider  carvedilol (COREG) 3.125 MG tablet take 1 tablet by mouth twice a day with meals 01/25/15  Yes Troy Sine, MD  fenofibrate (TRICOR) 145 MG tablet Take 145 mg by mouth daily.  10/18/12  Yes Historical Provider, MD  ferrous sulfate 325 (65 FE) MG EC tablet take 1 tablet by mouth once daily 01/17/15  Yes Troy Sine, MD  furosemide (LASIX) 40 MG tablet take 1 tablet by mouth once daily 01/10/15  Yes Troy Sine, MD  HYDROcodone-acetaminophen (NORCO/VICODIN) 5-325 MG tablet Take 1-2 tablets by mouth every 4 (four) hours as needed (breakthrough pain). 05/04/15  Yes Amber Cecilio Asper, PA-C  Niacin CR 1000 MG TBCR Take 1,000 mg by mouth at bedtime.  11/06/12  Yes Historical Provider, MD  polyvinyl alcohol (LIQUIFILM TEARS) 1.4 % ophthalmic solution Place 1 drop into both eyes 2 (two) times daily as needed for dry eyes.   Yes Historical Provider, MD  potassium chloride (K-DUR,KLOR-CON) 10 MEQ tablet take 1 tablet by mouth once daily 01/25/15  Yes Troy Sine, MD  quinapril (ACCUPRIL) 20 MG tablet Take 20 mg by mouth daily.  11/06/14  Yes Historical Provider, MD  warfarin (COUMADIN) 1 MG tablet Take 1 mg by mouth every Monday, Wednesday, and Friday.   Yes Historical Provider, MD  warfarin (COUMADIN) 5 MG tablet Take 5-6 mg by mouth daily. 6 mg on Monday, Wednesday, and Friday.  Take 5 mg on Tuesday, Thursday, Saturday, and Sunday. 11/13/12  Yes Historical Provider, MD     Vital Signs: BP 101/52 mmHg  Pulse 87  Temp(Src) 98.3 F (36.8  C) (Oral)  Resp 16  Ht 5\' 9"  (1.753 m)  Wt 314 lb 13.1 oz (142.8 kg)  BMI 46.47 kg/m2  SpO2 100%  Physical Exam  Skin: Skin is warm and dry.  Left groin sheath intact No hematoma No bleeding INR 2.2 today      Imaging: Ct Abdomen Pelvis Wo Contrast  06/08/2015  CLINICAL DATA:  Decreased hemoglobin and increased abdominal pain. EXAM: CT ABDOMEN AND PELVIS WITHOUT CONTRAST TECHNIQUE: Multidetector CT imaging of the abdomen and pelvis was performed following the standard protocol without IV contrast. COMPARISON:  06/02/2015 FINDINGS: Moderate size bilateral pleural effusions with basilar atelectasis. Effusions are increased since previous study. Large esophageal hiatal hernia behind the heart. Large right perinephric hematoma with hematocrit levels demonstrated. The hematoma surrounds the right kidney and displaces the kidney towards the midline. The hematoma measures about 14.2 x 18 x 20.7 cm. The hematoma is measuring larger than on the previous study suggesting progression. Hematoma extends into the right pericolic gutter and para renal fat Scholl planes. The left kidney is not identified and may be surgically absent. Surgically absent gallbladder. No bile duct dilatation. Unenhanced appearance of liver, spleen, pancreas, adrenal glands, abdominal aorta, and inferior vena cava are unremarkable. Mild mesenteric infiltration suggesting mild mesenteric hemorrhage. Stomach, small bowel, and colon are not abnormally distended. No free air in the abdomen. Pelvis: Foley catheter deflects the bladder. Prostate gland is enlarged. Diverticulosis of the sigmoid colon without evidence of diverticulitis. Degenerative changes  in the spine and hips. No destructive bone lesions. IMPRESSION: Large right perinephric hematoma demonstrating increase in size since previous study. Bilateral pleural effusions with basilar atelectasis also increasing. Electronically Signed   By: Lucienne Capers M.D.   On: 06/08/2015  00:57   Ir Angiogram Renal Right Selective  06/08/2015  INDICATION: Large acute right perinephric hematoma, requiring cardiopulmonary resuscitation EXAM: IR RENAL SUPRASEL UNILATERAL S+I MODERATE SEDATION; ARTERIOGRAPHY; IR EMBO ART VEN HEMORR LYMPH EXTRAV INC GUIDE ROADMAPPING; IR ULTRASOUND GUIDANCE VASC ACCESS RIGHT MEDICATIONS: 1% lidocaine locally. ANESTHESIA/SEDATION: Patient is already ventilated and sedated. The patient's level of consciousness and vital signs were monitored continuously by radiology nursing throughout the procedure under my direct supervision. CONTRAST:  75cc Isovue 300 FLUOROSCOPY TIME:  Fluoroscopy Time: 5 minutes 30 seconds (1,687 mGy). COMPLICATIONS: None immediate. PROCEDURE: Informed consent was obtained from the patient family following explanation of the procedure, risks, benefits and alternatives. The patient understands, agrees and consents for the procedure. All questions were addressed. A time out was performed prior to the initiation of the procedure. Maximal barrier sterile technique utilized including caps, mask, sterile gowns, sterile gloves, large sterile drape, hand hygiene, and Betadine prep. Under sterile conditions and local anesthesia, ultrasound micropuncture access performed of the patent left common femoral artery. Five fr sheath inserted over a Bentson guidewire. C2 catheter advanced into the right renal artery. Catheter advanced into the main right renal artery over a Glidewire. Selective right renal angiogram performed. Right renal angiogram: Main right renal artery is patent. Anterior and posterior divisions are patent. Segmental branch to the lower pole demonstrates a small peripheral focal arterial blush / pseudo aneurysm compatible with an active bleeding site. The right renal segmental and intraparenchymal branches demonstrate diffuse vaso constriction related to resuscitation. C2 catheter was advanced into the right renal lower pole segmental branch over  a Glidewire. Selective angiogram performed of the right kidney lower pole branch. This is the dominant arterial branch feeding the peripheral right renal bleeding site. Embolization: From this location, 035 3 mm M rey Cook coils (2) and 035 6 mm Nester coils (2) were deployed within the right lower pole segmental branch. Post embolization angiogram confirms complete occlusion of the right lower pole branch. Final right renal angiogram demonstrates no further active bleeding or other sites. Access removed. Left femoral sheet secured externally to an arterial flushed because of coagulopathy. IMPRESSION: Peripheral right kidney lower pole bleeding site localized. Segmental right lower pole feeding artery was catheterized with a 5 French C2 catheter for successful complete coil embolization as above. Electronically Signed   By: Jerilynn Mages.  Shick M.D.   On: 06/08/2015 10:25   Ir Angiogram Follow Up Study  06/08/2015  INDICATION: Large acute right perinephric hematoma, requiring cardiopulmonary resuscitation EXAM: IR RENAL SUPRASEL UNILATERAL S+I MODERATE SEDATION; ARTERIOGRAPHY; IR EMBO ART VEN HEMORR LYMPH EXTRAV INC GUIDE ROADMAPPING; IR ULTRASOUND GUIDANCE VASC ACCESS RIGHT MEDICATIONS: 1% lidocaine locally. ANESTHESIA/SEDATION: Patient is already ventilated and sedated. The patient's level of consciousness and vital signs were monitored continuously by radiology nursing throughout the procedure under my direct supervision. CONTRAST:  75cc Isovue 300 FLUOROSCOPY TIME:  Fluoroscopy Time: 5 minutes 30 seconds (1,687 mGy). COMPLICATIONS: None immediate. PROCEDURE: Informed consent was obtained from the patient family following explanation of the procedure, risks, benefits and alternatives. The patient understands, agrees and consents for the procedure. All questions were addressed. A time out was performed prior to the initiation of the procedure. Maximal barrier sterile technique utilized including caps, mask, sterile gowns,  sterile gloves, large sterile drape, hand hygiene, and Betadine prep. Under sterile conditions and local anesthesia, ultrasound micropuncture access performed of the patent left common femoral artery. Five fr sheath inserted over a Bentson guidewire. C2 catheter advanced into the right renal artery. Catheter advanced into the main right renal artery over a Glidewire. Selective right renal angiogram performed. Right renal angiogram: Main right renal artery is patent. Anterior and posterior divisions are patent. Segmental branch to the lower pole demonstrates a small peripheral focal arterial blush / pseudo aneurysm compatible with an active bleeding site. The right renal segmental and intraparenchymal branches demonstrate diffuse vaso constriction related to resuscitation. C2 catheter was advanced into the right renal lower pole segmental branch over a Glidewire. Selective angiogram performed of the right kidney lower pole branch. This is the dominant arterial branch feeding the peripheral right renal bleeding site. Embolization: From this location, 035 3 mm M rey Cook coils (2) and 035 6 mm Nester coils (2) were deployed within the right lower pole segmental branch. Post embolization angiogram confirms complete occlusion of the right lower pole branch. Final right renal angiogram demonstrates no further active bleeding or other sites. Access removed. Left femoral sheet secured externally to an arterial flushed because of coagulopathy. IMPRESSION: Peripheral right kidney lower pole bleeding site localized. Segmental right lower pole feeding artery was catheterized with a 5 French C2 catheter for successful complete coil embolization as above. Electronically Signed   By: Jerilynn Mages.  Shick M.D.   On: 06/08/2015 10:25   Ir US Guide Vasc Access Right  06/08/2015  INDICATION: Large acute right perinephric hematoma, requiring cardiopulmonary resuscitation EXAM: IR RENAL SUPRASEL UNILATERAL S+I MODERATE SEDATION; ARTERIOGRAPHY;  IR EMBO ART VEN HEMORR LYMPH EXTRAV INC GUIDE ROADMAPPING; IR ULTRASOUND GUIDANCE VASC ACCESS RIGHT MEDICATIONS: 1% lidocaine locally. ANESTHESIA/SEDATION: Patient is already ventilated and sedated. The patient's level of consciousness and vital signs were monitored continuously by radiology nursing throughout the procedure under my direct supervision. CONTRAST:  75cc Isovue 300 FLUOROSCOPY TIME:  Fluoroscopy Time: 5 minutes 30 seconds (1,687 mGy). COMPLICATIONS: None immediate. PROCEDURE: Informed consent was obtained from the patient family following explanation of the procedure, risks, benefits and alternatives. The patient understands, agrees and consents for the procedure. All questions were addressed. A time out was performed prior to the initiation of the procedure. Maximal barrier sterile technique utilized including caps, mask, sterile gowns, sterile gloves, large sterile drape, hand hygiene, and Betadine prep. Under sterile conditions and local anesthesia, ultrasound micropuncture access performed of the patent left common femoral artery. Five fr sheath inserted over a Bentson guidewire. C2 catheter advanced into the right renal artery. Catheter advanced into the main right renal artery over a Glidewire. Selective right renal angiogram performed. Right renal angiogram: Main right renal artery is patent. Anterior and posterior divisions are patent. Segmental branch to the lower pole demonstrates a small peripheral focal arterial blush / pseudo aneurysm compatible with an active bleeding site. The right renal segmental and intraparenchymal branches demonstrate diffuse vaso constriction related to resuscitation. C2 catheter was advanced into the right renal lower pole segmental branch over a Glidewire. Selective angiogram performed of the right kidney lower pole branch. This is the dominant arterial branch feeding the peripheral right renal bleeding site. Embolization: From this location, 035 3 mm M rey Cook  coils (2) and 035 6 mm Nester coils (2) were deployed within the right lower pole segmental branch. Post embolization angiogram confirms complete occlusion of the right lower pole branch. Final right renal  angiogram demonstrates no further active bleeding or other sites. Access removed. Left femoral sheet secured externally to an arterial flushed because of coagulopathy. IMPRESSION: Peripheral right kidney lower pole bleeding site localized. Segmental right lower pole feeding artery was catheterized with a 5 French C2 catheter for successful complete coil embolization as above. Electronically Signed   By: Jerilynn Mages.  Shick M.D.   On: 06/08/2015 10:25   Dg Chest Portable 1 View  06/10/2015  CLINICAL DATA:  Acute respiratory failure, septic shock. EXAM: PORTABLE CHEST 1 VIEW COMPARISON:  Portable chest x-ray of Jun 09, 2015 FINDINGS: The lungs are slightly less well inflated today. Extensive subcutaneous emphysema persists bilaterally. Classic pleural lines of pneumothoraces are not evident. There may be pleural space air projecting over the lateral aspect of the left third rib however. On the right and no pneumothorax is evident. The cardiac silhouette remains enlarged. The pulmonary vascularity is engorged but slightly improved in appearance overall. The endotracheal tube tip lies approximately 4 cm above the carina. The esophagogastric tube tip projects in the distal third of the esophagus. The large caliber right internal jugular venous catheter tip projects over the proximal SVC. The left-sided chest tube tip projects over the posterior medial aspect of the fourth rib. IMPRESSION: 1. Probable small left apical pneumothorax but a classic pleural line is not observed. The left-sided chest tube is in stable position. The subcutaneous emphysema persists but is slightly less conspicuous overall. No right-sided pneumothorax. A right chest tube is in stable position with the tip overlying the posterior- medial aspect of the  right seventh rib. 2. Congestive heart failure with slight decrease in pulmonary edema and vascular congestion. 3. Persistent unsatisfactory positioning of the esophagogastric tube with the tip and proximal port lying in the distal and mid esophagus respectively. 4. Endotracheal tube, bilateral chest tubes, and right internal jugular venous catheters in appropriate position. Electronically Signed   By: David  Martinique M.D.   On: 06/10/2015 07:10   Dg Chest Port 1 View  06/09/2015  CLINICAL DATA:  Chest tube in place. Right-sided chest tube was incidentally removed and replaced. Check placement. EXAM: PORTABLE CHEST 1 VIEW COMPARISON:  06/09/2015 FINDINGS: Endotracheal tube with tip measuring 4.1 cm above the carina. The enteric tube tip is positioned over the middle mediastinum consistent with location in the mid esophagus. Bilateral chest tubes appear to be in place. Right central venous catheter with tip over the upper SVC region. Extensive subcutaneous emphysema is demonstrated throughout the chest and in the neck. No definite pneumothorax although subcutaneous emphysema could obscure visualization of the small pneumothorax. Lungs appear mostly expanded. Atelectasis in the lung bases. Mild cardiac enlargement. Shallow inspiration. IMPRESSION: Appliances positioned as described with enteric tube tip likely in the mid esophagus. Cardiac enlargement. Atelectasis in the lung bases. Diffuse subcutaneous emphysema. Electronically Signed   By: Lucienne Capers M.D.   On: 06/09/2015 21:12   Dg Chest Port 1 View  06/09/2015  CLINICAL DATA:  Nasogastric tube placement EXAM: PORTABLE CHEST 1 VIEW COMPARISON:  Study obtained earlier in the day FINDINGS: The nasogastric tube tip is in the distal esophagus with the side port in the mid esophageal region. Endotracheal tube tip is 2.8 cm above the carina. Central catheter tip is in the superior vena cava. Chest tube on the left is again noted. There is a rather minimal lateral  pneumothorax on the left without tension component. Extensive subcutaneous air bilaterally is again noted. There is patchy bibasilar atelectasis. No new parenchymal lung opacity  is seen. Heart is borderline enlarged with pulmonary vascularity within normal limits. No adenopathy evident. IMPRESSION: Nasogastric tube with tip in distal esophagus. Advise advancing nasogastric tube approximately 20 cm to confirm that the nasogastric tube tip and side port are well within the stomach. Other tubes and catheters as described. Minimal lateral pneumothorax on the left without tension component. Extensive subcutaneous air remains diffusely. There is patchy atelectasis bilaterally without new opacity. No change in cardiac silhouette. These results will be called to the ordering clinician or representative by the Radiologist Assistant, and communication documented in the PACS or zVision Dashboard. Electronically Signed   By: Lowella Grip III M.D.   On: 06/09/2015 13:19   Dg Chest Port 1 View  06/09/2015  CLINICAL DATA:  Shortness of breath.  Chest tubes. EXAM: PORTABLE CHEST 1 VIEW COMPARISON:  06/08/2015. FINDINGS: Endotracheal tube, right IJ sheath, bilateral chest tubes in stable position. No pneumothorax identified. Extensive but improving diffuse neck and chest wall subcutaneous emphysema again noted. Stable cardiomegaly. Persistent bilateral airspace disease. No interim change. IMPRESSION: 1. Lines and tubes including bilateral chest tubes in stable position. No pneumothorax. Extensive but improving diffuse chest wall and neck subcutaneous emphysema. 2. Persistent unchanged bilateral airspace disease. Stable cardiomegaly. Electronically Signed   By: Marcello Moores  Register   On: 06/09/2015 07:37   Dg Chest Port 1 View  06/08/2015  CLINICAL DATA:  Post chest tube insertion EXAM: PORTABLE CHEST 1 VIEW COMPARISON:  06/08/2015 dated 1453 hours FINDINGS: Extensive subcutaneous emphysema overlying the bilateral chest and  along the lateral chest wall/axilla. Indwelling bilateral chest tubes.  No definite pneumothorax is seen. Mild patchy opacity at the left lung base, likely compressive atelectasis. Endotracheal tube terminates 3 cm above the carina. Cardiomegaly. Right IJ venous catheter terminates in the upper SVC. IMPRESSION: Indwelling bilateral chest tubes.  No definite pneumothorax is seen. Extensive subcutaneous emphysema. Endotracheal tube terminates 3 cm above the carina. Electronically Signed   By: Julian Hy M.D.   On: 06/08/2015 17:20   Dg Chest Port 1 View  06/08/2015  CLINICAL DATA:  75 year old who underwent transcatheter embolization of a right lower pole renal artery due to acute hemorrhage. Ventilator dependent respiratory failure. Followup subcutaneous emphysema. EXAM: PORTABLE CHEST 1 VIEW 2:53 p.m.: COMPARISON:  Portable chest x-ray earlier today 8:11 a.m. and previously. FINDINGS: Since examination earlier today, marked progression of now severe diffuse subcutaneous emphysema throughout the chest wall and neck. Endotracheal tube tip remains in satisfactory position projecting approximately 3 cm above the carina. Right subclavian central venous catheter tip projects over the upper SVC, unchanged. External pacing pads. Cardiac silhouette mildly to moderately enlarged, unchanged. Interval development of patchy opacities involving the lung bases, right greater than left, since earlier today. Pulmonary vascularity normal. IMPRESSION: 1. Marked progression of now severe diffuse subcutaneous emphysema throughout the chest wall and neck. 2. Support apparatus satisfactory. 3. Developing atelectasis and/or pneumonia involving the lung bases, right greater than left. 4. Stable cardiomegaly without pulmonary edema. Electronically Signed   By: Evangeline Dakin M.D.   On: 06/08/2015 15:11   Portable Chest Xray  06/08/2015  CLINICAL DATA:  Hypoxia EXAM: PORTABLE CHEST 1 VIEW COMPARISON:  June 02, 2015 FINDINGS:  Endotracheal tube tip is 2.1 cm above the carina. Right central catheter tip is in the superior vena cava. No pneumothorax is evident. However, there is extensive soft tissue air in the left lateral hemithorax region. There is interstitial and patchy alveolar edema throughout the lungs bilaterally, slightly increased. There is cardiomegaly  with mild pulmonary venous hypertension. No adenopathy evident. IMPRESSION: Tube and catheter positions as described. There is extensive soft tissue air in the left chest without pneumothorax appreciable on this supine examination. Evidence of underlying congestive heart failure. Superimposed bibasilar pneumonia cannot be excluded radiographically. Electronically Signed   By: Lowella Grip III M.D.   On: 06/08/2015 08:30   Dg Abd Portable 1v  06/09/2015  CLINICAL DATA:  Status post nasogastric catheter placement EXAM: PORTABLE ABDOMEN - 1 VIEW COMPARISON:  None. FINDINGS: Scattered large and small bowel gas is noted. The nasogastric catheter is again seen in the distal esophagus stable from the recent chest x-ray. Significant subcutaneous emphysema is noted. Changes of recent renal embolization on the right are again seen. IMPRESSION: Nasogastric catheter within the distal esophagus. Electronically Signed   By: Inez Catalina M.D.   On: 06/09/2015 14:51   Dg Abd Portable 1v  06/09/2015  CLINICAL DATA:  Check feeding tube placement EXAM: PORTABLE ABDOMEN - 1 VIEW COMPARISON:  None. FINDINGS: Scattered large and small bowel gas is noted. Subcutaneous emphysema is noted similar to that seen on recent chest x-ray. A feeding catheter is not identified at this time. Changes of recent renal embolization on the right are seen. No new focal abnormality is noted per IMPRESSION: No feeding catheter is identified. Subcutaneous emphysema. Electronically Signed   By: Inez Catalina M.D.   On: 06/09/2015 13:09   [facility] Guide Roadmapping  06/08/2015   INDICATION: Large acute right perinephric hematoma, requiring cardiopulmonary resuscitation EXAM: IR RENAL SUPRASEL UNILATERAL S+I MODERATE SEDATION; ARTERIOGRAPHY; IR EMBO ART VEN HEMORR LYMPH EXTRAV INC GUIDE ROADMAPPING; IR ULTRASOUND GUIDANCE VASC ACCESS RIGHT MEDICATIONS: 1% lidocaine locally. ANESTHESIA/SEDATION: Patient is already ventilated and sedated. The patient's level of consciousness and vital signs were monitored continuously by radiology nursing throughout the procedure under my direct supervision. CONTRAST:  75cc Isovue 300 FLUOROSCOPY TIME:  Fluoroscopy Time: 5 minutes 30 seconds (1,687 mGy). COMPLICATIONS: None immediate. PROCEDURE: Informed consent was obtained from the patient family following explanation of the procedure, risks, benefits and alternatives. The patient understands, agrees and consents for the procedure. All questions were addressed. A time out was performed prior to the initiation of the procedure. Maximal barrier sterile technique utilized including caps, mask, sterile gowns, sterile gloves, large sterile drape, hand hygiene, and Betadine prep. Under sterile conditions and local anesthesia, ultrasound micropuncture access performed of the patent left common femoral artery. Five fr sheath inserted over a Bentson guidewire. C2 catheter advanced into the right renal artery. Catheter advanced into the main right renal artery over a Glidewire. Selective right renal angiogram performed. Right renal angiogram: Main right renal artery is patent. Anterior and posterior divisions are patent. Segmental branch to the lower pole demonstrates a small peripheral focal arterial blush / pseudo aneurysm compatible with an active bleeding site. The right renal segmental and intraparenchymal branches demonstrate diffuse vaso constriction related to resuscitation. C2 catheter was advanced into the right renal lower pole segmental branch over a Glidewire. Selective angiogram performed of the right  kidney lower pole branch. This is the dominant arterial branch feeding the peripheral right renal bleeding site. Embolization: From this location, 035 3 mm M rey Cook coils (2) and 035 6 mm Nester coils (2) were deployed within the right lower pole segmental branch. Post embolization angiogram confirms complete occlusion of the right lower pole branch. Final right renal angiogram demonstrates no further active bleeding or other sites. Access  removed. Left femoral sheet secured externally to an arterial flushed because of coagulopathy. IMPRESSION: Peripheral right kidney lower pole bleeding site localized. Segmental right lower pole feeding artery was catheterized with a 5 French C2 catheter for successful complete coil embolization as above. Electronically Signed   By: Jerilynn Mages.  Shick M.D.   On: 06/08/2015 10:25    Labs:  CBC:  Recent Labs  06/09/15 0421 06/09/15 1820 06/10/15 0305 06/10/15 1000  WBC 43.1* 28.1* 31.3* 24.6*  HGB 9.6* 7.1* 7.8* 8.1*  HCT 27.5* 20.5* 22.9* 24.2*  PLT 151 115* 128* 115*    COAGS:  Recent Labs  06/06/15 0210 06/08/15 0615 06/08/15 1023 06/08/15 1954 06/09/15 1000 06/10/15 0305  INR 2.04* 2.80* 3.43* 2.77* 2.59* 2.23*  APTT 31 35 47* 33  --   --     BMP:  Recent Labs  06/09/15 1641 06/09/15 2211 06/10/15 0305 06/10/15 1122  NA 136 137 138 137  K 4.7 4.7 4.7 4.7  CL 100* 101 101 104  CO2 22 24 26 23   GLUCOSE 209* 194* 176* 163*  BUN 40* 37* 34* 32*  CALCIUM 7.2* 7.3* 7.5* 7.4*  CREATININE 3.69* 3.19* 2.88* 2.58*  GFRNONAA 15* 18* 20* 23*  GFRAA 17* 21* 23* 27*    LIVER FUNCTION TESTS:  Recent Labs  04/29/15 1545 06/01/15 1043  06/06/15 0210  06/09/15 0500 06/09/15 1128 06/09/15 1641 06/10/15 0305  BILITOT 0.7 1.5*  --  0.6  --   --  2.2*  --   --   AST 26  --   --  18  --   --  1199*  --   --   ALT 20  --   --  15*  --   --  527*  --   --   ALKPHOS 29*  --   --  64  --   --  107  --   --   PROT 6.4*  --   --  5.2*  --   --   4.6*  --   --   ALBUMIN 3.6  --   < > 1.9*  1.8*  < > 2.3* 2.2* 2.0* 2.1*  < > = values in this interval not displayed.  Assessment and Plan:  Renal artery embolization 5/3 Left groin sheath intact RN not using our sheath INR 2.2 today Plan for removal poss 5/6  Electronically Signed: Laketia Vicknair A 06/10/2015, 3:18 PM   I spent a total of 15 Minutes at the the patient's bedside AND on the patient's hospital floor or unit, greater than 50% of which was counseling/coordinating care for renal artery embo 5/3

## 2015-06-10 NOTE — Progress Notes (Signed)
PULMONARY / CRITICAL CARE MEDICINE   Name: [name] MRN: [id number] DOB: [birth year]    ADMISSION DATE:  05/31/2015 CONSULTATION DATE:  4/25  REFERRING MD:  EDP  CHIEF COMPLAINT:  Hypotension   HISTORY OF PRESENT ILLNESS:   75yo male with hx PE, OSA, CKD III (L nephrectomy r/t RCC), DM initially admitted 4/25 with sepsis r/t UTI.  Admitted by PCCM, requiring vasopressors.  He improved slowly and was tx out to med-surg on 4/30.  On 5/2 developed R flank pain and CT abd/pelvis revealed increased R perinephric hematoma worsened by heparin gtt and pt developed worsening hypotension requiring tx back to ICU and vasopressors.  Pt continued to deteriorate overnight and ultimately had cardiac arrest am 5/3 with approx 12 mins CPR.  Multiple blood products given.  Pt taken to IR for embolization of R perinephric hematoma once stabilized.  Course now c/b AKI on CKD requiring CVVHD, bilat chest tubes r/t significant sub-q emphysema post CPR.    SUBJECTIVE:  Levo down to 26 mics CT leak min left  VITAL SIGNS: BP 135/66 mmHg  Pulse 92  Temp(Src) 98.2 F (36.8 C) (Oral)  Resp 18  Ht 5\' 9"  (1.753 m)  Wt 142.8 kg (314 lb 13.1 oz)  BMI 46.47 kg/m2  SpO2 100%  HEMODYNAMICS: CVP:  [0 mmHg-15 mmHg] 3 mmHg  VENTILATOR SETTINGS: Vent Mode:  [-] PRVC FiO2 (%):  [40 %-50 %] 40 % Set Rate:  [16 bmp-28 bmp] 16 bmp Vt Set:  [570 mL-5470 mL] 5470 mL PEEP:  [5 cmH20] 5 cmH20 Plateau Pressure:  [18 cmH20-23 cmH20] 23 cmH20  INTAKE / OUTPUT: I/O last 3 completed shifts: In: 3424.1 [I.V.:2864.1; Blood:460; IV Piggyback:100] Out: 3021 [Other:2741; Chest Tube:280]  PHYSICAL EXAMINATION: General:  Chronically ill appearing male, acutely ill  Neuro:  Sedated on vent, RASS -1, FC HEENT:  Mm moist, no JVD, ETT  Cardiovascular:  s1s2 rrr Lungs:  coarse Abdomen:  Round, distended, hypoactive bs  Musculoskeletal:  Warm and dry, 1+ BLE edema   LABS:  BMET  Recent Labs Lab 06/09/15 1641  06/09/15 2211 06/10/15 0305  NA 136 137 138  K 4.7 4.7 4.7  CL 100* 101 101  CO2 22 24 26   BUN 40* 37* 34*  CREATININE 3.69* 3.19* 2.88*  GLUCOSE 209* 194* 176*    Electrolytes  Recent Labs Lab 06/09/15 1128 06/09/15 1641 06/09/15 2211 06/10/15 0305  CALCIUM 7.4* 7.2* 7.3* 7.5*  MG 2.1  --  2.2 2.4  PHOS 5.4* 5.9* 4.9* 4.4    CBC  Recent Labs Lab 06/09/15 0421 06/09/15 1820 06/10/15 0305  WBC 43.1* 28.1* 31.3*  HGB 9.6* 7.1* 7.8*  HCT 27.5* 20.5* 22.9*  PLT 151 115* 128*    Coag's  Recent Labs Lab 06/08/15 0615 06/08/15 1023 06/08/15 1954 06/09/15 1000 06/10/15 0305  APTT 35 47* 33  --   --   INR 2.80* 3.43* 2.77* 2.59* 2.23*    Sepsis Markers  Recent Labs Lab 06/09/15 0400 06/09/15 1128 06/09/15 1641 06/10/15 0305  LATICACIDVEN 9.9* 6.4* 3.3*  --   PROCALCITON  --  20.49  --  14.32    ABG  Recent Labs Lab 06/09/15 1246 06/09/15 1744 06/10/15 0811  PHART 7.541* 7.427 7.420  PCO2ART 23.5* 33.1* 39.0  PO2ART 81.0 91.0 124.0*    Liver Enzymes  Recent Labs Lab 06/06/15 0210  06/09/15 1128 06/09/15 1641 06/10/15 0305  AST 18  --  1199*  --   --   ALT  15*  --  527*  --   --   ALKPHOS 64  --  107  --   --   BILITOT 0.6  --  2.2*  --   --   ALBUMIN 1.9*  1.8*  < > 2.2* 2.0* 2.1*  < > = values in this interval not displayed.  Cardiac Enzymes  Recent Labs Lab 06/08/15 0808 06/08/15 1410 06/08/15 1852  TROPONINI 0.08* 0.32* 0.27*    Glucose  Recent Labs Lab 06/09/15 1130 06/09/15 1556 06/09/15 1958 06/09/15 2326 06/10/15 0309 06/10/15 0748  GLUCAP 148* 189* 163* 195* 174* 161*    Imaging Dg Chest Portable 1 View  06/10/2015  CLINICAL DATA:  Acute respiratory failure, septic shock. EXAM: PORTABLE CHEST 1 VIEW COMPARISON:  Portable chest x-ray of Jun 09, 2015 FINDINGS: The lungs are slightly less well inflated today. Extensive subcutaneous emphysema persists bilaterally. Classic pleural lines of pneumothoraces are  not evident. There may be pleural space air projecting over the lateral aspect of the left third rib however. On the right and no pneumothorax is evident. The cardiac silhouette remains enlarged. The pulmonary vascularity is engorged but slightly improved in appearance overall. The endotracheal tube tip lies approximately 4 cm above the carina. The esophagogastric tube tip projects in the distal third of the esophagus. The large caliber right internal jugular venous catheter tip projects over the proximal SVC. The left-sided chest tube tip projects over the posterior medial aspect of the fourth rib. IMPRESSION: 1. Probable small left apical pneumothorax but a classic pleural line is not observed. The left-sided chest tube is in stable position. The subcutaneous emphysema persists but is slightly less conspicuous overall. No right-sided pneumothorax. A right chest tube is in stable position with the tip overlying the posterior- medial aspect of the right seventh rib. 2. Congestive heart failure with slight decrease in pulmonary edema and vascular congestion. 3. Persistent unsatisfactory positioning of the esophagogastric tube with the tip and proximal port lying in the distal and mid esophagus respectively. 4. Endotracheal tube, bilateral chest tubes, and right internal jugular venous catheters in appropriate position. Electronically Signed   By: David  Martinique M.D.   On: 06/10/2015 07:10   Dg Chest Port 1 View  06/09/2015  CLINICAL DATA:  Chest tube in place. Right-sided chest tube was incidentally removed and replaced. Check placement. EXAM: PORTABLE CHEST 1 VIEW COMPARISON:  06/09/2015 FINDINGS: Endotracheal tube with tip measuring 4.1 cm above the carina. The enteric tube tip is positioned over the middle mediastinum consistent with location in the mid esophagus. Bilateral chest tubes appear to be in place. Right central venous catheter with tip over the upper SVC region. Extensive subcutaneous emphysema is  demonstrated throughout the chest and in the neck. No definite pneumothorax although subcutaneous emphysema could obscure visualization of the small pneumothorax. Lungs appear mostly expanded. Atelectasis in the lung bases. Mild cardiac enlargement. Shallow inspiration. IMPRESSION: Appliances positioned as described with enteric tube tip likely in the mid esophagus. Cardiac enlargement. Atelectasis in the lung bases. Diffuse subcutaneous emphysema. Electronically Signed   By: Lucienne Capers M.D.   On: 06/09/2015 21:12   Dg Chest Port 1 View  06/09/2015  CLINICAL DATA:  Nasogastric tube placement EXAM: PORTABLE CHEST 1 VIEW COMPARISON:  Study obtained earlier in the day FINDINGS: The nasogastric tube tip is in the distal esophagus with the side port in the mid esophageal region. Endotracheal tube tip is 2.8 cm above the carina. Central catheter tip is in the superior vena  cava. Chest tube on the left is again noted. There is a rather minimal lateral pneumothorax on the left without tension component. Extensive subcutaneous air bilaterally is again noted. There is patchy bibasilar atelectasis. No new parenchymal lung opacity is seen. Heart is borderline enlarged with pulmonary vascularity within normal limits. No adenopathy evident. IMPRESSION: Nasogastric tube with tip in distal esophagus. Advise advancing nasogastric tube approximately 20 cm to confirm that the nasogastric tube tip and side port are well within the stomach. Other tubes and catheters as described. Minimal lateral pneumothorax on the left without tension component. Extensive subcutaneous air remains diffusely. There is patchy atelectasis bilaterally without new opacity. No change in cardiac silhouette. These results will be called to the ordering clinician or representative by the Radiologist Assistant, and communication documented in the PACS or zVision Dashboard. Electronically Signed   By: Lowella Grip III M.D.   On: 06/09/2015 13:19    Dg Abd Portable 1v  06/09/2015  CLINICAL DATA:  Status post nasogastric catheter placement EXAM: PORTABLE ABDOMEN - 1 VIEW COMPARISON:  None. FINDINGS: Scattered large and small bowel gas is noted. The nasogastric catheter is again seen in the distal esophagus stable from the recent chest x-ray. Significant subcutaneous emphysema is noted. Changes of recent renal embolization on the right are again seen. IMPRESSION: Nasogastric catheter within the distal esophagus. Electronically Signed   By: Inez Catalina M.D.   On: 06/09/2015 14:51   Dg Abd Portable 1v  06/09/2015  CLINICAL DATA:  Check feeding tube placement EXAM: PORTABLE ABDOMEN - 1 VIEW COMPARISON:  None. FINDINGS: Scattered large and small bowel gas is noted. Subcutaneous emphysema is noted similar to that seen on recent chest x-ray. A feeding catheter is not identified at this time. Changes of recent renal embolization on the right are seen. No new focal abnormality is noted per IMPRESSION: No feeding catheter is identified. Subcutaneous emphysema. Electronically Signed   By: Inez Catalina M.D.   On: 06/09/2015 13:09     STUDIES:  CXR 04/25 > Low volumes. LLL opacity may reflect atelectasis or infiltrate. Renal US 04/25 > surgically absent left kidney, no hydronephrosis, small right renal cyst. CT A / P 05/02 > large right perinephric hematoma demonstrating increase in size since previous study.  CULTURES: Blood 04/25 > Klebsiella  Urine 04/25 > Multiple species present MRSA nares > negative Blood 04/27 > NGTD  ANTIBIOTICS: Ceftaz 04/25 >off Vanc 04/25 > 04/25 Ceftriaxone 04/29 >  SIGNIFICANT EVENTS: 04/25 > admitted with septic shock due to UTI. Significant ARF. 04/30 > transferred out of ICU. 05/03 > transferred back to ICU due to acute blood loss anemia presumed due to worsening right perinephric hematoma, hypotension. 5/3>> cardiac arrest  5/3>> IR embolization R perinephric hematoma  5/3- PTX left , bilateral chest  tubes for massive sub q air - from cpr  LINES/TUBES: R IJ HD cath 04/26 > ETT 5/3>>>  L femoral CVL 5/3>>> L femoral sheath 5/3>>> L chest tube 5/3>>> R chest tube 5/3>>>  DISCUSSION: 75 y.o. M with hx RCC and prostate CA s/p radiation 2011 and left nephrectomy with increased INR, ARF, perinephric hematoma.  ASSESSMENT / PLAN:  CARDIOVASCULAR A:  Hemorrhagic Shock - right perinephric hematoma +/- sepsis r/t klebsiella UTI  Hx a.fib (on coumadin), HLD, HTN. Cardiogenic component? CI borderline on NONinvasive P:  Net even on CVVHD  Levophed as needed to maintain goal MAP > 60 Consider bolus and assess levo needs cvp 3, sv % change 13 with bolus =  fluid responsive  PULMONARY A: Acute hypoxic respiratory failure. 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 Respiratory alkalosis  Extensive Sub-q emphysema - s/p bilat chest tubes  P:  High pressors, No sbt abg noted, reduce rate12  HEMATOLOGIC / ONCOLOGIC A:  Acute blood loss anemia/hemorrhagic shock - worsening / increase in size of right perinephric hematoma - s/p IR embolization  Coagulopathy - s/p multiple units FFP and vitamin K. Hx prostate CA s/p radiation 2011, RCC s/p left nephrectomy, hx DVT / PE (on coumadin) Leukocytosis hemoconcentration  P:  F/u CBC q12h SCD's  Vit k low dose repeat wolod favor sheeth removal, per IR  RENAL A:  Acute renal failure - started on temp HD this admission. CKD III -- baseline Scr ~1.9 Chronic foley w/hematuria at admission - improving, likely 2/2 infx Perinephric hematoma - etiology could be benign cause, idiopathic or recurrence of malignancy given his RCC hx; increased in size 05/02. P:  Cont CVVHD See cvs, will bolus once   INFECTIOUS A:  Septic shock - due to klebsiella UTI/ bacteremia. Leukocytosis -- LIKELY all related to bleed P:  Continue ceftriaxone, will need to extend duration Trend pct  downward  GASTROINTESTINAL A:  Nutrition. P:  Place cortrak and feed ppi  ENDOCRINE A:  DM. P:  Continue SSI.  NEUROLOGIC A:  Pain Sedation needs on vent  Follows commands - mental status encouraging s/p arrest/ongoing shock  P:  Fentanyl gtt  No benzo drip, lft Rass goal -2    FAMILY  - Updates:  Updated wife at length at bedside.  daily  - Inter-disciplinary family meet or Palliative Care meeting due by:  day 7  Ccm time 30 min   Lavon Paganini. Titus Mould, MD, [place] Pgr: [facility] 06/10/2015 9:34 AM

## 2015-06-10 NOTE — Progress Notes (Signed)
This RN unable to pass cortrak tube post 30cm without coiling. Back up RD called to bedside to place tube.

## 2015-06-10 NOTE — Progress Notes (Signed)
Unable to place cortrak feeding tube. RN aware. Recommend consulting IR for feeding tube placement.

## 2015-06-10 NOTE — Progress Notes (Signed)
Assessment/ Plan: Pt is a 75 y.o. yo male who was admitted on 05/31/2015 with septic shock 2/2 klebsiella bacteremia. Readmitted to the ICU 06/08/2015 with hemorrhagic shock in the setting of a right perinephric hematoma. Now s/p coil embolization.  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 Patient's Cr was notably elevated at 9.33, his baseline is thought to be around 1.7-1.9.   1. AKI/CKD, patient s/p left nephrectomy and now s/p partial right renal artery coil embolization 2/2 perinephric hematoma. Anuric.  - On CVVHD given tenuous hemodynamic status - goal net even 2. Hemorrhagic shock 2/2 Acute blood loss anemia 2/2 right perinephric hematoma. Also with coagulopathy, s/p FFP and vit K  - s/p coil embolization by IR  - On pressors per CCM  - Trend CBC, transfuse prn  - Trend INR 3. Urosepsis / Positive blood culture pan-sensitive Kleb Pneumo. abx per primary team.   Renal Attending: He is anuric due to recent shock, contrast dye and embolization of inferior pole of kidney.  Alert and follows commands.  Will cont. CVVHD support. Azlyn Wingler C   Subjective: Currently intubated and sedated. Follows some commands  Objective: Vital signs in last 24 hours: Temp:  [97.2 F (36.2 C)-99.2 F (37.3 C)] 98.2 F (36.8 C) (05/05 0723) Pulse Rate:  [65-108] 101 (05/05 0749) Resp:  [14-35] 19 (05/05 0749) BP: (76-145)/(36-111) 135/66 mmHg (05/05 0749) SpO2:  [93 %-100 %] 100 % (05/05 0749) Arterial Line BP: (91-158)/(46-149) 91/46 mmHg (05/05 0700) FiO2 (%):  [40 %-50 %] 40 % (05/05 0749) Weight:  [314 lb 13.1 oz (142.8 kg)] 314 lb 13.1 oz (142.8 kg) (05/05 0405) Weight change: -3.5 oz (-0.1 kg)  Intake/Output from previous day: 05/04 0701 - 05/05 0700 In: 2355.1 [I.V.:1795.1; Blood:460; IV Piggyback:100] Out: 2706 [Chest Tube:160] Intake/Output this shift:    General  appearance: Sedated and intubated Resp: intubated, Coarse breath sounds bilaterally, chest tubes in places bilaterally Cardio: RRR GI: S, NT, ND Ext: 1+ bilateral LE edema Neuro: Sedated, Follows some commands.   Lab Results:  Recent Labs  06/09/15 1820 06/10/15 0305  WBC 28.1* 31.3*  HGB 7.1* 7.8*  HCT 20.5* 22.9*  PLT 115* 128*   BMET:   Recent Labs  06/09/15 2211 06/10/15 0305  NA 137 138  K 4.7 4.7  CL 101 101  CO2 24 26  GLUCOSE 194* 176*  BUN 37* 34*  CREATININE 3.19* 2.88*  CALCIUM 7.3* 7.5*   No results for input(s): PTH in the last 72 hours. Iron Studies: No results for input(s): IRON, TIBC, TRANSFERRIN, FERRITIN in the last 72 hours. Studies/Results: Ir Angiogram Renal Right Selective  06/08/2015  INDICATION: Large acute right perinephric hematoma, requiring cardiopulmonary resuscitation EXAM: IR RENAL SUPRASEL UNILATERAL S+I MODERATE SEDATION; ARTERIOGRAPHY; IR EMBO ART VEN HEMORR LYMPH EXTRAV INC GUIDE ROADMAPPING; IR ULTRASOUND GUIDANCE VASC ACCESS RIGHT MEDICATIONS: 1% lidocaine locally. ANESTHESIA/SEDATION: Patient is already ventilated and sedated. The patient's level of consciousness and vital signs were monitored continuously by radiology nursing throughout the procedure under my direct supervision. CONTRAST:  75cc Isovue 300 FLUOROSCOPY TIME:  Fluoroscopy Time: 5 minutes 30 seconds (1,687 mGy). COMPLICATIONS: None immediate. PROCEDURE: Informed consent was obtained from the patient family following explanation of the procedure, risks, benefits and alternatives. The patient understands, agrees and consents for the procedure. All questions were addressed. A time out was performed prior to the initiation of the procedure. Maximal barrier sterile technique  utilized including caps, mask, sterile gowns, sterile gloves, large sterile drape, hand hygiene, and Betadine prep. Under sterile conditions and local anesthesia, ultrasound micropuncture access performed of the  patent left common femoral artery. Five fr sheath inserted over a Bentson guidewire. C2 catheter advanced into the right renal artery. Catheter advanced into the main right renal artery over a Glidewire. Selective right renal angiogram performed. Right renal angiogram: Main right renal artery is patent. Anterior and posterior divisions are patent. Segmental branch to the lower pole demonstrates a small peripheral focal arterial blush / pseudo aneurysm compatible with an active bleeding site. The right renal segmental and intraparenchymal branches demonstrate diffuse vaso constriction related to resuscitation. C2 catheter was advanced into the right renal lower pole segmental branch over a Glidewire. Selective angiogram performed of the right kidney lower pole branch. This is the dominant arterial branch feeding the peripheral right renal bleeding site. Embolization: From this location, 035 3 mm M rey Cook coils (2) and 035 6 mm Nester coils (2) were deployed within the right lower pole segmental branch. Post embolization angiogram confirms complete occlusion of the right lower pole branch. Final right renal angiogram demonstrates no further active bleeding or other sites. Access removed. Left femoral sheet secured externally to an arterial flushed because of coagulopathy. IMPRESSION: Peripheral right kidney lower pole bleeding site localized. Segmental right lower pole feeding artery was catheterized with a 5 French C2 catheter for successful complete coil embolization as above. Electronically Signed   By: Jerilynn Mages.  Shick M.D.   On: 06/08/2015 10:25   Ir Angiogram Follow Up Study  06/08/2015  INDICATION: Large acute right perinephric hematoma, requiring cardiopulmonary resuscitation EXAM: IR RENAL SUPRASEL UNILATERAL S+I MODERATE SEDATION; ARTERIOGRAPHY; IR EMBO ART VEN HEMORR LYMPH EXTRAV INC GUIDE ROADMAPPING; IR ULTRASOUND GUIDANCE VASC ACCESS RIGHT MEDICATIONS: 1% lidocaine locally. ANESTHESIA/SEDATION: Patient is  already ventilated and sedated. The patient's level of consciousness and vital signs were monitored continuously by radiology nursing throughout the procedure under my direct supervision. CONTRAST:  75cc Isovue 300 FLUOROSCOPY TIME:  Fluoroscopy Time: 5 minutes 30 seconds (1,687 mGy). COMPLICATIONS: None immediate. PROCEDURE: Informed consent was obtained from the patient family following explanation of the procedure, risks, benefits and alternatives. The patient understands, agrees and consents for the procedure. All questions were addressed. A time out was performed prior to the initiation of the procedure. Maximal barrier sterile technique utilized including caps, mask, sterile gowns, sterile gloves, large sterile drape, hand hygiene, and Betadine prep. Under sterile conditions and local anesthesia, ultrasound micropuncture access performed of the patent left common femoral artery. Five fr sheath inserted over a Bentson guidewire. C2 catheter advanced into the right renal artery. Catheter advanced into the main right renal artery over a Glidewire. Selective right renal angiogram performed. Right renal angiogram: Main right renal artery is patent. Anterior and posterior divisions are patent. Segmental branch to the lower pole demonstrates a small peripheral focal arterial blush / pseudo aneurysm compatible with an active bleeding site. The right renal segmental and intraparenchymal branches demonstrate diffuse vaso constriction related to resuscitation. C2 catheter was advanced into the right renal lower pole segmental branch over a Glidewire. Selective angiogram performed of the right kidney lower pole branch. This is the dominant arterial branch feeding the peripheral right renal bleeding site. Embolization: From this location, 035 3 mm M rey Cook coils (2) and 035 6 mm Nester coils (2) were deployed within the right lower pole segmental branch. Post embolization angiogram confirms complete occlusion of the  right  lower pole branch. Final right renal angiogram demonstrates no further active bleeding or other sites. Access removed. Left femoral sheet secured externally to an arterial flushed because of coagulopathy. IMPRESSION: Peripheral right kidney lower pole bleeding site localized. Segmental right lower pole feeding artery was catheterized with a 5 French C2 catheter for successful complete coil embolization as above. Electronically Signed   By: Jerilynn Mages.  Shick M.D.   On: 06/08/2015 10:25   Ir US Guide Vasc Access Right  06/08/2015  INDICATION: Large acute right perinephric hematoma, requiring cardiopulmonary resuscitation EXAM: IR RENAL SUPRASEL UNILATERAL S+I MODERATE SEDATION; ARTERIOGRAPHY; IR EMBO ART VEN HEMORR LYMPH EXTRAV INC GUIDE ROADMAPPING; IR ULTRASOUND GUIDANCE VASC ACCESS RIGHT MEDICATIONS: 1% lidocaine locally. ANESTHESIA/SEDATION: Patient is already ventilated and sedated. The patient's level of consciousness and vital signs were monitored continuously by radiology nursing throughout the procedure under my direct supervision. CONTRAST:  75cc Isovue 300 FLUOROSCOPY TIME:  Fluoroscopy Time: 5 minutes 30 seconds (1,687 mGy). COMPLICATIONS: None immediate. PROCEDURE: Informed consent was obtained from the patient family following explanation of the procedure, risks, benefits and alternatives. The patient understands, agrees and consents for the procedure. All questions were addressed. A time out was performed prior to the initiation of the procedure. Maximal barrier sterile technique utilized including caps, mask, sterile gowns, sterile gloves, large sterile drape, hand hygiene, and Betadine prep. Under sterile conditions and local anesthesia, ultrasound micropuncture access performed of the patent left common femoral artery. Five fr sheath inserted over a Bentson guidewire. C2 catheter advanced into the right renal artery. Catheter advanced into the main right renal artery over a Glidewire. Selective right  renal angiogram performed. Right renal angiogram: Main right renal artery is patent. Anterior and posterior divisions are patent. Segmental branch to the lower pole demonstrates a small peripheral focal arterial blush / pseudo aneurysm compatible with an active bleeding site. The right renal segmental and intraparenchymal branches demonstrate diffuse vaso constriction related to resuscitation. C2 catheter was advanced into the right renal lower pole segmental branch over a Glidewire. Selective angiogram performed of the right kidney lower pole branch. This is the dominant arterial branch feeding the peripheral right renal bleeding site. Embolization: From this location, 035 3 mm M rey Cook coils (2) and 035 6 mm Nester coils (2) were deployed within the right lower pole segmental branch. Post embolization angiogram confirms complete occlusion of the right lower pole branch. Final right renal angiogram demonstrates no further active bleeding or other sites. Access removed. Left femoral sheet secured externally to an arterial flushed because of coagulopathy. IMPRESSION: Peripheral right kidney lower pole bleeding site localized. Segmental right lower pole feeding artery was catheterized with a 5 French C2 catheter for successful complete coil embolization as above. Electronically Signed   By: Jerilynn Mages.  Shick M.D.   On: 06/08/2015 10:25   Dg Chest Portable 1 View  06/10/2015  CLINICAL DATA:  Acute respiratory failure, septic shock. EXAM: PORTABLE CHEST 1 VIEW COMPARISON:  Portable chest x-ray of Jun 09, 2015 FINDINGS: The lungs are slightly less well inflated today. Extensive subcutaneous emphysema persists bilaterally. Classic pleural lines of pneumothoraces are not evident. There may be pleural space air projecting over the lateral aspect of the left third rib however. On the right and no pneumothorax is evident. The cardiac silhouette remains enlarged. The pulmonary vascularity is engorged but slightly improved in  appearance overall. The endotracheal tube tip lies approximately 4 cm above the carina. The esophagogastric tube tip projects in the distal third of the esophagus.  The large caliber right internal jugular venous catheter tip projects over the proximal SVC. The left-sided chest tube tip projects over the posterior medial aspect of the fourth rib. IMPRESSION: 1. Probable small left apical pneumothorax but a classic pleural line is not observed. The left-sided chest tube is in stable position. The subcutaneous emphysema persists but is slightly less conspicuous overall. No right-sided pneumothorax. A right chest tube is in stable position with the tip overlying the posterior- medial aspect of the right seventh rib. 2. Congestive heart failure with slight decrease in pulmonary edema and vascular congestion. 3. Persistent unsatisfactory positioning of the esophagogastric tube with the tip and proximal port lying in the distal and mid esophagus respectively. 4. Endotracheal tube, bilateral chest tubes, and right internal jugular venous catheters in appropriate position. Electronically Signed   By: David  Martinique M.D.   On: 06/10/2015 07:10   Dg Chest Port 1 View  06/09/2015  CLINICAL DATA:  Chest tube in place. Right-sided chest tube was incidentally removed and replaced. Check placement. EXAM: PORTABLE CHEST 1 VIEW COMPARISON:  06/09/2015 FINDINGS: Endotracheal tube with tip measuring 4.1 cm above the carina. The enteric tube tip is positioned over the middle mediastinum consistent with location in the mid esophagus. Bilateral chest tubes appear to be in place. Right central venous catheter with tip over the upper SVC region. Extensive subcutaneous emphysema is demonstrated throughout the chest and in the neck. No definite pneumothorax although subcutaneous emphysema could obscure visualization of the small pneumothorax. Lungs appear mostly expanded. Atelectasis in the lung bases. Mild cardiac enlargement. Shallow  inspiration. IMPRESSION: Appliances positioned as described with enteric tube tip likely in the mid esophagus. Cardiac enlargement. Atelectasis in the lung bases. Diffuse subcutaneous emphysema. Electronically Signed   By: Lucienne Capers M.D.   On: 06/09/2015 21:12   Dg Chest Port 1 View  06/09/2015  CLINICAL DATA:  Nasogastric tube placement EXAM: PORTABLE CHEST 1 VIEW COMPARISON:  Study obtained earlier in the day FINDINGS: The nasogastric tube tip is in the distal esophagus with the side port in the mid esophageal region. Endotracheal tube tip is 2.8 cm above the carina. Central catheter tip is in the superior vena cava. Chest tube on the left is again noted. There is a rather minimal lateral pneumothorax on the left without tension component. Extensive subcutaneous air bilaterally is again noted. There is patchy bibasilar atelectasis. No new parenchymal lung opacity is seen. Heart is borderline enlarged with pulmonary vascularity within normal limits. No adenopathy evident. IMPRESSION: Nasogastric tube with tip in distal esophagus. Advise advancing nasogastric tube approximately 20 cm to confirm that the nasogastric tube tip and side port are well within the stomach. Other tubes and catheters as described. Minimal lateral pneumothorax on the left without tension component. Extensive subcutaneous air remains diffusely. There is patchy atelectasis bilaterally without new opacity. No change in cardiac silhouette. These results will be called to the ordering clinician or representative by the Radiologist Assistant, and communication documented in the PACS or zVision Dashboard. Electronically Signed   By: Lowella Grip III M.D.   On: 06/09/2015 13:19   Dg Chest Port 1 View  06/09/2015  CLINICAL DATA:  Shortness of breath.  Chest tubes. EXAM: PORTABLE CHEST 1 VIEW COMPARISON:  06/08/2015. FINDINGS: Endotracheal tube, right IJ sheath, bilateral chest tubes in stable position. No pneumothorax identified.  Extensive but improving diffuse neck and chest wall subcutaneous emphysema again noted. Stable cardiomegaly. Persistent bilateral airspace disease. No interim change. IMPRESSION: 1. Lines and tubes  including bilateral chest tubes in stable position. No pneumothorax. Extensive but improving diffuse chest wall and neck subcutaneous emphysema. 2. Persistent unchanged bilateral airspace disease. Stable cardiomegaly. Electronically Signed   By: Marcello Moores  Register   On: 06/09/2015 07:37   Dg Chest Port 1 View  06/08/2015  CLINICAL DATA:  Post chest tube insertion EXAM: PORTABLE CHEST 1 VIEW COMPARISON:  06/08/2015 dated 1453 hours FINDINGS: Extensive subcutaneous emphysema overlying the bilateral chest and along the lateral chest wall/axilla. Indwelling bilateral chest tubes.  No definite pneumothorax is seen. Mild patchy opacity at the left lung base, likely compressive atelectasis. Endotracheal tube terminates 3 cm above the carina. Cardiomegaly. Right IJ venous catheter terminates in the upper SVC. IMPRESSION: Indwelling bilateral chest tubes.  No definite pneumothorax is seen. Extensive subcutaneous emphysema. Endotracheal tube terminates 3 cm above the carina. Electronically Signed   By: Julian Hy M.D.   On: 06/08/2015 17:20   Dg Chest Port 1 View  06/08/2015  CLINICAL DATA:  75 year old who underwent transcatheter embolization of a right lower pole renal artery due to acute hemorrhage. Ventilator dependent respiratory failure. Followup subcutaneous emphysema. EXAM: PORTABLE CHEST 1 VIEW 2:53 p.m.: COMPARISON:  Portable chest x-ray earlier today 8:11 a.m. and previously. FINDINGS: Since examination earlier today, marked progression of now severe diffuse subcutaneous emphysema throughout the chest wall and neck. Endotracheal tube tip remains in satisfactory position projecting approximately 3 cm above the carina. Right subclavian central venous catheter tip projects over the upper SVC, unchanged. External  pacing pads. Cardiac silhouette mildly to moderately enlarged, unchanged. Interval development of patchy opacities involving the lung bases, right greater than left, since earlier today. Pulmonary vascularity normal. IMPRESSION: 1. Marked progression of now severe diffuse subcutaneous emphysema throughout the chest wall and neck. 2. Support apparatus satisfactory. 3. Developing atelectasis and/or pneumonia involving the lung bases, right greater than left. 4. Stable cardiomegaly without pulmonary edema. Electronically Signed   By: Evangeline Dakin M.D.   On: 06/08/2015 15:11   Portable Chest Xray  06/08/2015  CLINICAL DATA:  Hypoxia EXAM: PORTABLE CHEST 1 VIEW COMPARISON:  June 02, 2015 FINDINGS: Endotracheal tube tip is 2.1 cm above the carina. Right central catheter tip is in the superior vena cava. No pneumothorax is evident. However, there is extensive soft tissue air in the left lateral hemithorax region. There is interstitial and patchy alveolar edema throughout the lungs bilaterally, slightly increased. There is cardiomegaly with mild pulmonary venous hypertension. No adenopathy evident. IMPRESSION: Tube and catheter positions as described. There is extensive soft tissue air in the left chest without pneumothorax appreciable on this supine examination. Evidence of underlying congestive heart failure. Superimposed bibasilar pneumonia cannot be excluded radiographically. Electronically Signed   By: Lowella Grip III M.D.   On: 06/08/2015 08:30   Dg Abd Portable 1v  06/09/2015  CLINICAL DATA:  Status post nasogastric catheter placement EXAM: PORTABLE ABDOMEN - 1 VIEW COMPARISON:  None. FINDINGS: Scattered large and small bowel gas is noted. The nasogastric catheter is again seen in the distal esophagus stable from the recent chest x-ray. Significant subcutaneous emphysema is noted. Changes of recent renal embolization on the right are again seen. IMPRESSION: Nasogastric catheter within the distal  esophagus. Electronically Signed   By: Inez Catalina M.D.   On: 06/09/2015 14:51   Dg Abd Portable 1v  06/09/2015  CLINICAL DATA:  Check feeding tube placement EXAM: PORTABLE ABDOMEN - 1 VIEW COMPARISON:  None. FINDINGS: Scattered large and small bowel gas is noted. Subcutaneous emphysema is  noted similar to that seen on recent chest x-ray. A feeding catheter is not identified at this time. Changes of recent renal embolization on the right are seen. No new focal abnormality is noted per IMPRESSION: No feeding catheter is identified. Subcutaneous emphysema. Electronically Signed   By: Inez Catalina M.D.   On: 06/09/2015 13:09   Santel Guide Roadmapping  06/08/2015  INDICATION: Large acute right perinephric hematoma, requiring cardiopulmonary resuscitation EXAM: IR RENAL SUPRASEL UNILATERAL S+I MODERATE SEDATION; ARTERIOGRAPHY; IR EMBO ART VEN HEMORR LYMPH EXTRAV INC GUIDE ROADMAPPING; IR ULTRASOUND GUIDANCE VASC ACCESS RIGHT MEDICATIONS: 1% lidocaine locally. ANESTHESIA/SEDATION: Patient is already ventilated and sedated. The patient's level of consciousness and vital signs were monitored continuously by radiology nursing throughout the procedure under my direct supervision. CONTRAST:  75cc Isovue 300 FLUOROSCOPY TIME:  Fluoroscopy Time: 5 minutes 30 seconds (1,687 mGy). COMPLICATIONS: None immediate. PROCEDURE: Informed consent was obtained from the patient family following explanation of the procedure, risks, benefits and alternatives. The patient understands, agrees and consents for the procedure. All questions were addressed. A time out was performed prior to the initiation of the procedure. Maximal barrier sterile technique utilized including caps, mask, sterile gowns, sterile gloves, large sterile drape, hand hygiene, and Betadine prep. Under sterile conditions and local anesthesia, ultrasound micropuncture access performed of the patent left common femoral artery. Five fr  sheath inserted over a Bentson guidewire. C2 catheter advanced into the right renal artery. Catheter advanced into the main right renal artery over a Glidewire. Selective right renal angiogram performed. Right renal angiogram: Main right renal artery is patent. Anterior and posterior divisions are patent. Segmental branch to the lower pole demonstrates a small peripheral focal arterial blush / pseudo aneurysm compatible with an active bleeding site. The right renal segmental and intraparenchymal branches demonstrate diffuse vaso constriction related to resuscitation. C2 catheter was advanced into the right renal lower pole segmental branch over a Glidewire. Selective angiogram performed of the right kidney lower pole branch. This is the dominant arterial branch feeding the peripheral right renal bleeding site. Embolization: From this location, 035 3 mm M rey Cook coils (2) and 035 6 mm Nester coils (2) were deployed within the right lower pole segmental branch. Post embolization angiogram confirms complete occlusion of the right lower pole branch. Final right renal angiogram demonstrates no further active bleeding or other sites. Access removed. Left femoral sheet secured externally to an arterial flushed because of coagulopathy. IMPRESSION: Peripheral right kidney lower pole bleeding site localized. Segmental right lower pole feeding artery was catheterized with a 5 French C2 catheter for successful complete coil embolization as above. Electronically Signed   By: Jerilynn Mages.  Shick M.D.   On: 06/08/2015 10:25    Scheduled: . antiseptic oral rinse  7 mL Mouth Rinse QID  . cefTRIAXone (ROCEPHIN)  IV  2 g Intravenous Q24H  . chlorhexidine gluconate (SAGE KIT)  15 mL Mouth Rinse BID  . sennosides  5 mL Oral BID   And  . docusate  100 mg Oral BID  . feeding supplement (PRO-STAT SUGAR FREE 64)  30 mL Per Tube TID  . feeding supplement (VITAL HIGH PROTEIN)  1,000 mL Per Tube Q24H  . fentaNYL (SUBLIMAZE) injection  50  mcg Intravenous Once  . insulin aspart  0-15 Units Subcutaneous Q4H  . pantoprazole (PROTONIX) IV  40 mg Intravenous Q24H    LOS: 10 days   Dimas Chyle 06/10/2015,8:09 AM

## 2015-06-11 ENCOUNTER — Inpatient Hospital Stay (HOSPITAL_COMMUNITY): Payer: BLUE CROSS/BLUE SHIELD

## 2015-06-11 DIAGNOSIS — I482 Chronic atrial fibrillation: Secondary | ICD-10-CM

## 2015-06-11 LAB — RENAL FUNCTION PANEL
ALBUMIN: 1.8 g/dL — AB (ref 3.5–5.0)
ANION GAP: 11 (ref 5–15)
Albumin: 1.8 g/dL — ABNORMAL LOW (ref 3.5–5.0)
Anion gap: 13 (ref 5–15)
BUN: 27 mg/dL — ABNORMAL HIGH (ref 6–20)
BUN: 22 mg/dL — AB (ref 6–20)
CALCIUM: 7.6 mg/dL — AB (ref 8.9–10.3)
CHLORIDE: 104 mmol/L (ref 101–111)
CO2: 23 mmol/L (ref 22–32)
CO2: 21 mmol/L — AB (ref 22–32)
Calcium: 7.7 mg/dL — ABNORMAL LOW (ref 8.9–10.3)
Chloride: 102 mmol/L (ref 101–111)
Creatinine, Ser: 2.26 mg/dL — ABNORMAL HIGH (ref 0.61–1.24)
Creatinine, Ser: 2.15 mg/dL — ABNORMAL HIGH (ref 0.61–1.24)
GFR calc Af Amer: 31 mL/min — ABNORMAL LOW (ref >60)
GFR calc Af Amer: 33 mL/min — ABNORMAL LOW (ref >60)
GFR calc non Af Amer: 29 mL/min — ABNORMAL LOW (ref >60)
GFR, EST NON AFRICAN AMERICAN: 27 mL/min — AB (ref >60)
GLUCOSE: 157 mg/dL — AB (ref 65–99)
Glucose, Bld: 158 mg/dL — ABNORMAL HIGH (ref 65–99)
POTASSIUM: 4.8 mmol/L (ref 3.5–5.1)
Phosphorus: 4.2 mg/dL (ref 2.5–4.6)
Phosphorus: 4 mg/dL (ref 2.5–4.6)
Potassium: 4.7 mmol/L (ref 3.5–5.1)
SODIUM: 138 mmol/L (ref 135–145)
Sodium: 136 mmol/L (ref 135–145)

## 2015-06-11 LAB — TYPE AND SCREEN
ABO/RH(D): A POS
Antibody Screen: NEGATIVE
UNIT DIVISION: 0
UNIT DIVISION: 0
UNIT DIVISION: 0
UNIT DIVISION: 0
UNIT DIVISION: 0
UNIT DIVISION: 0
Unit division: 0
Unit division: 0
Unit division: 0
Unit division: 0
Unit division: 0
Unit division: 0

## 2015-06-11 LAB — PHOSPHORUS
PHOSPHORUS: 4 mg/dL (ref 2.5–4.6)
Phosphorus: 3.8 mg/dL (ref 2.5–4.6)

## 2015-06-11 LAB — CBC
HCT: 21.6 % — ABNORMAL LOW (ref 39.0–52.0)
HEMATOCRIT: 27.7 % — AB (ref 39.0–52.0)
HEMOGLOBIN: 9 g/dL — AB (ref 13.0–17.0)
Hemoglobin: 7.1 g/dL — ABNORMAL LOW (ref 13.0–17.0)
MCH: 29.1 pg (ref 26.0–34.0)
MCH: 28.6 pg (ref 26.0–34.0)
MCHC: 32.9 g/dL (ref 30.0–36.0)
MCHC: 32.5 g/dL (ref 30.0–36.0)
MCV: 88.5 fL (ref 78.0–100.0)
MCV: 87.9 fL (ref 78.0–100.0)
PLATELETS: 104 10*3/uL — AB (ref 150–400)
Platelets: 85 10*3/uL — ABNORMAL LOW (ref 150–400)
RBC: 2.44 MIL/uL — AB (ref 4.22–5.81)
RBC: 3.15 MIL/uL — ABNORMAL LOW (ref 4.22–5.81)
RDW: 16.4 % — AB (ref 11.5–15.5)
RDW: 15.6 % — AB (ref 11.5–15.5)
WBC: 22.1 10*3/uL — ABNORMAL HIGH (ref 4.0–10.5)
WBC: 18.4 10*3/uL — ABNORMAL HIGH (ref 4.0–10.5)

## 2015-06-11 LAB — GLUCOSE, CAPILLARY
GLUCOSE-CAPILLARY: 156 mg/dL — AB (ref 65–99)
GLUCOSE-CAPILLARY: 152 mg/dL — AB (ref 65–99)
GLUCOSE-CAPILLARY: 144 mg/dL — AB (ref 65–99)
GLUCOSE-CAPILLARY: 135 mg/dL — AB (ref 65–99)
Glucose-Capillary: 153 mg/dL — ABNORMAL HIGH (ref 65–99)
Glucose-Capillary: 138 mg/dL — ABNORMAL HIGH (ref 65–99)

## 2015-06-11 LAB — MAGNESIUM
MAGNESIUM: 2.3 mg/dL (ref 1.7–2.4)
MAGNESIUM: 2.3 mg/dL (ref 1.7–2.4)
MAGNESIUM: 2.3 mg/dL (ref 1.7–2.4)

## 2015-06-11 LAB — PROTIME-INR
INR: 1.83 — ABNORMAL HIGH (ref 0.00–1.49)
PROTHROMBIN TIME: 21.1 s — AB (ref 11.6–15.2)

## 2015-06-11 LAB — PROCALCITONIN: PROCALCITONIN: 12.01 ng/mL

## 2015-06-11 LAB — PREPARE RBC (CROSSMATCH)

## 2015-06-11 MED ORDER — SODIUM CHLORIDE 0.9 % IV SOLN: Freq: Once | INTRAVENOUS | Status: DC

## 2015-06-11 NOTE — Progress Notes (Signed)
Patient ID: [name], male   DOB: 12-06-1940, 74 y.o.   MRN: [id number]    Referring [id number]): [id number]  Supervising Physician: Aletta Edouard  Patient Status: In-pt  Chief Complaint: Right perinephric hematoma   Subjective: Pt intubated, on CVVHD/pressors with bil chest tubes, wife in room   Allergies: Review of patient's allergies indicates no known allergies.  Medications: Prior to Admission medications   Medication Sig Start Date End Date Taking? Authorizing Provider  carvedilol (COREG) 3.125 MG tablet take 1 tablet by mouth twice a day with meals 01/25/15  Yes Troy Sine, MD  fenofibrate (TRICOR) 145 MG tablet Take 145 mg by mouth daily.  10/18/12  Yes Historical Provider, MD  ferrous sulfate 325 (65 FE) MG EC tablet take 1 tablet by mouth once daily 01/17/15  Yes Troy Sine, MD  furosemide (LASIX) 40 MG tablet take 1 tablet by mouth once daily 01/10/15  Yes Troy Sine, MD  HYDROcodone-acetaminophen (NORCO/VICODIN) 5-325 MG tablet Take 1-2 tablets by mouth every 4 (four) hours as needed (breakthrough pain). 05/04/15  Yes Amber Cecilio Asper, PA-C  Niacin CR 1000 MG TBCR Take 1,000 mg by mouth at bedtime.  11/06/12  Yes Historical Provider, MD  polyvinyl alcohol (LIQUIFILM TEARS) 1.4 % ophthalmic solution Place 1 drop into both eyes 2 (two) times daily as needed for dry eyes.   Yes Historical Provider, MD  potassium chloride (K-DUR,KLOR-CON) 10 MEQ tablet take 1 tablet by mouth once daily 01/25/15  Yes Troy Sine, MD  quinapril (ACCUPRIL) 20 MG tablet Take 20 mg by mouth daily.  11/06/14  Yes Historical Provider, MD  warfarin (COUMADIN) 1 MG tablet Take 1 mg by mouth every Monday, Wednesday, and Friday.   Yes Historical Provider, MD  warfarin (COUMADIN) 5 MG tablet Take 5-6 mg by mouth daily. 6 mg on Monday, Wednesday, and Friday.  Take 5 mg on Tuesday, Thursday, Saturday, and Sunday. 11/13/12  Yes Historical Provider, MD     Vital Signs: BP 96/48  mmHg  Pulse 78  Temp(Src) 97.4 F (36.3 C) (Oral)  Resp 14  Ht 5\' 9"  (1.753 m)  Wt 313 lb 15 oz (142.4 kg)  BMI 46.34 kg/m2  SpO2 100%  Physical Exam left CFA sheath intact, site soft, no obvious hematoma; distal pulses +/- pedal edema noted; abd dist, few BS  Imaging: Ct Abdomen Pelvis Wo Contrast  06/08/2015  CLINICAL DATA:  Decreased hemoglobin and increased abdominal pain. EXAM: CT ABDOMEN AND PELVIS WITHOUT CONTRAST TECHNIQUE: Multidetector CT imaging of the abdomen and pelvis was performed following the standard protocol without IV contrast. COMPARISON:  06/02/2015 FINDINGS: Moderate size bilateral pleural effusions with basilar atelectasis. Effusions are increased since previous study. Large esophageal hiatal hernia behind the heart. Large right perinephric hematoma with hematocrit levels demonstrated. The hematoma surrounds the right kidney and displaces the kidney towards the midline. The hematoma measures about 14.2 x 18 x 20.7 cm. The hematoma is measuring larger than on the previous study suggesting progression. Hematoma extends into the right pericolic gutter and para renal fat Scholl planes. The left kidney is not identified and may be surgically absent. Surgically absent gallbladder. No bile duct dilatation. Unenhanced appearance of liver, spleen, pancreas, adrenal glands, abdominal aorta, and inferior vena cava are unremarkable. Mild mesenteric infiltration suggesting mild mesenteric hemorrhage. Stomach, small bowel, and colon are not abnormally distended. No free air in the abdomen. Pelvis: Foley catheter deflects the bladder. Prostate gland is enlarged. Diverticulosis of the sigmoid colon without evidence  of diverticulitis. Degenerative changes in the spine and hips. No destructive bone lesions. IMPRESSION: Large right perinephric hematoma demonstrating increase in size since previous study. Bilateral pleural effusions with basilar atelectasis also increasing. Electronically Signed    By: Lucienne Capers M.D.   On: 06/08/2015 00:57   Ir Angiogram Renal Right Selective  06/08/2015  INDICATION: Large acute right perinephric hematoma, requiring cardiopulmonary resuscitation EXAM: IR RENAL SUPRASEL UNILATERAL S+I MODERATE SEDATION; ARTERIOGRAPHY; IR EMBO ART VEN HEMORR LYMPH EXTRAV INC GUIDE ROADMAPPING; IR ULTRASOUND GUIDANCE VASC ACCESS RIGHT MEDICATIONS: 1% lidocaine locally. ANESTHESIA/SEDATION: Patient is already ventilated and sedated. The patient's level of consciousness and vital signs were monitored continuously by radiology nursing throughout the procedure under my direct supervision. CONTRAST:  75cc Isovue 300 FLUOROSCOPY TIME:  Fluoroscopy Time: 5 minutes 30 seconds (1,687 mGy). COMPLICATIONS: None immediate. PROCEDURE: Informed consent was obtained from the patient family following explanation of the procedure, risks, benefits and alternatives. The patient understands, agrees and consents for the procedure. All questions were addressed. A time out was performed prior to the initiation of the procedure. Maximal barrier sterile technique utilized including caps, mask, sterile gowns, sterile gloves, large sterile drape, hand hygiene, and Betadine prep. Under sterile conditions and local anesthesia, ultrasound micropuncture access performed of the patent left common femoral artery. Five fr sheath inserted over a Bentson guidewire. C2 catheter advanced into the right renal artery. Catheter advanced into the main right renal artery over a Glidewire. Selective right renal angiogram performed. Right renal angiogram: Main right renal artery is patent. Anterior and posterior divisions are patent. Segmental branch to the lower pole demonstrates a small peripheral focal arterial blush / pseudo aneurysm compatible with an active bleeding site. The right renal segmental and intraparenchymal branches demonstrate diffuse vaso constriction related to resuscitation. C2 catheter was advanced into the  right renal lower pole segmental branch over a Glidewire. Selective angiogram performed of the right kidney lower pole branch. This is the dominant arterial branch feeding the peripheral right renal bleeding site. Embolization: From this location, 035 3 mm M rey Cook coils (2) and 035 6 mm Nester coils (2) were deployed within the right lower pole segmental branch. Post embolization angiogram confirms complete occlusion of the right lower pole branch. Final right renal angiogram demonstrates no further active bleeding or other sites. Access removed. Left femoral sheet secured externally to an arterial flushed because of coagulopathy. IMPRESSION: Peripheral right kidney lower pole bleeding site localized. Segmental right lower pole feeding artery was catheterized with a 5 French C2 catheter for successful complete coil embolization as above. Electronically Signed   By: Jerilynn Mages.  Shick M.D.   On: 06/08/2015 10:25   Ir Angiogram Follow Up Study  06/08/2015  INDICATION: Large acute right perinephric hematoma, requiring cardiopulmonary resuscitation EXAM: IR RENAL SUPRASEL UNILATERAL S+I MODERATE SEDATION; ARTERIOGRAPHY; IR EMBO ART VEN HEMORR LYMPH EXTRAV INC GUIDE ROADMAPPING; IR ULTRASOUND GUIDANCE VASC ACCESS RIGHT MEDICATIONS: 1% lidocaine locally. ANESTHESIA/SEDATION: Patient is already ventilated and sedated. The patient's level of consciousness and vital signs were monitored continuously by radiology nursing throughout the procedure under my direct supervision. CONTRAST:  75cc Isovue 300 FLUOROSCOPY TIME:  Fluoroscopy Time: 5 minutes 30 seconds (1,687 mGy). COMPLICATIONS: None immediate. PROCEDURE: Informed consent was obtained from the patient family following explanation of the procedure, risks, benefits and alternatives. The patient understands, agrees and consents for the procedure. All questions were addressed. A time out was performed prior to the initiation of the procedure. Maximal barrier sterile technique  utilized including  caps, mask, sterile gowns, sterile gloves, large sterile drape, hand hygiene, and Betadine prep. Under sterile conditions and local anesthesia, ultrasound micropuncture access performed of the patent left common femoral artery. Five fr sheath inserted over a Bentson guidewire. C2 catheter advanced into the right renal artery. Catheter advanced into the main right renal artery over a Glidewire. Selective right renal angiogram performed. Right renal angiogram: Main right renal artery is patent. Anterior and posterior divisions are patent. Segmental branch to the lower pole demonstrates a small peripheral focal arterial blush / pseudo aneurysm compatible with an active bleeding site. The right renal segmental and intraparenchymal branches demonstrate diffuse vaso constriction related to resuscitation. C2 catheter was advanced into the right renal lower pole segmental branch over a Glidewire. Selective angiogram performed of the right kidney lower pole branch. This is the dominant arterial branch feeding the peripheral right renal bleeding site. Embolization: From this location, 035 3 mm M rey Cook coils (2) and 035 6 mm Nester coils (2) were deployed within the right lower pole segmental branch. Post embolization angiogram confirms complete occlusion of the right lower pole branch. Final right renal angiogram demonstrates no further active bleeding or other sites. Access removed. Left femoral sheet secured externally to an arterial flushed because of coagulopathy. IMPRESSION: Peripheral right kidney lower pole bleeding site localized. Segmental right lower pole feeding artery was catheterized with a 5 French C2 catheter for successful complete coil embolization as above. Electronically Signed   By: Jerilynn Mages.  Shick M.D.   On: 06/08/2015 10:25   Ir US Guide Vasc Access Right  06/08/2015  INDICATION: Large acute right perinephric hematoma, requiring cardiopulmonary resuscitation EXAM: IR RENAL SUPRASEL  UNILATERAL S+I MODERATE SEDATION; ARTERIOGRAPHY; IR EMBO ART VEN HEMORR LYMPH EXTRAV INC GUIDE ROADMAPPING; IR ULTRASOUND GUIDANCE VASC ACCESS RIGHT MEDICATIONS: 1% lidocaine locally. ANESTHESIA/SEDATION: Patient is already ventilated and sedated. The patient's level of consciousness and vital signs were monitored continuously by radiology nursing throughout the procedure under my direct supervision. CONTRAST:  75cc Isovue 300 FLUOROSCOPY TIME:  Fluoroscopy Time: 5 minutes 30 seconds (1,687 mGy). COMPLICATIONS: None immediate. PROCEDURE: Informed consent was obtained from the patient family following explanation of the procedure, risks, benefits and alternatives. The patient understands, agrees and consents for the procedure. All questions were addressed. A time out was performed prior to the initiation of the procedure. Maximal barrier sterile technique utilized including caps, mask, sterile gowns, sterile gloves, large sterile drape, hand hygiene, and Betadine prep. Under sterile conditions and local anesthesia, ultrasound micropuncture access performed of the patent left common femoral artery. Five fr sheath inserted over a Bentson guidewire. C2 catheter advanced into the right renal artery. Catheter advanced into the main right renal artery over a Glidewire. Selective right renal angiogram performed. Right renal angiogram: Main right renal artery is patent. Anterior and posterior divisions are patent. Segmental branch to the lower pole demonstrates a small peripheral focal arterial blush / pseudo aneurysm compatible with an active bleeding site. The right renal segmental and intraparenchymal branches demonstrate diffuse vaso constriction related to resuscitation. C2 catheter was advanced into the right renal lower pole segmental branch over a Glidewire. Selective angiogram performed of the right kidney lower pole branch. This is the dominant arterial branch feeding the peripheral right renal bleeding site.  Embolization: From this location, 035 3 mm M rey Cook coils (2) and 035 6 mm Nester coils (2) were deployed within the right lower pole segmental branch. Post embolization angiogram confirms complete occlusion of the right lower pole  branch. Final right renal angiogram demonstrates no further active bleeding or other sites. Access removed. Left femoral sheet secured externally to an arterial flushed because of coagulopathy. IMPRESSION: Peripheral right kidney lower pole bleeding site localized. Segmental right lower pole feeding artery was catheterized with a 5 French C2 catheter for successful complete coil embolization as above. Electronically Signed   By: Jerilynn Mages.  Shick M.D.   On: 06/08/2015 10:25   Dg Chest Port 1 View  06/11/2015  CLINICAL DATA:  Pneumothorax after CPR. EXAM: PORTABLE CHEST 1 VIEW COMPARISON:  Yesterday FINDINGS: Endotracheal tube tip is between the clavicular heads and carina. Right IJ central line with tip near the SVC origin. Bilateral chest tubes in stable position. No convincing pneumothorax. Left-sided rib fractures without gross increase in displacement. Dense opacity behind the heart with air bronchograms. Diffuse chest wall emphysema which is decreasing. IMPRESSION: 1. Stable positioning of tubes and central line. 2. No visible pneumothorax. Extensive chest wall emphysema is gradually decreasing. 3. Retrocardiac atelectasis or pneumonia. Electronically Signed   By: Monte Fantasia M.D.   On: 06/11/2015 07:15   Dg Chest Portable 1 View  06/10/2015  CLINICAL DATA:  Acute respiratory failure, septic shock. EXAM: PORTABLE CHEST 1 VIEW COMPARISON:  Portable chest x-ray of Jun 09, 2015 FINDINGS: The lungs are slightly less well inflated today. Extensive subcutaneous emphysema persists bilaterally. Classic pleural lines of pneumothoraces are not evident. There may be pleural space air projecting over the lateral aspect of the left third rib however. On the right and no pneumothorax is evident.  The cardiac silhouette remains enlarged. The pulmonary vascularity is engorged but slightly improved in appearance overall. The endotracheal tube tip lies approximately 4 cm above the carina. The esophagogastric tube tip projects in the distal third of the esophagus. The large caliber right internal jugular venous catheter tip projects over the proximal SVC. The left-sided chest tube tip projects over the posterior medial aspect of the fourth rib. IMPRESSION: 1. Probable small left apical pneumothorax but a classic pleural line is not observed. The left-sided chest tube is in stable position. The subcutaneous emphysema persists but is slightly less conspicuous overall. No right-sided pneumothorax. A right chest tube is in stable position with the tip overlying the posterior- medial aspect of the right seventh rib. 2. Congestive heart failure with slight decrease in pulmonary edema and vascular congestion. 3. Persistent unsatisfactory positioning of the esophagogastric tube with the tip and proximal port lying in the distal and mid esophagus respectively. 4. Endotracheal tube, bilateral chest tubes, and right internal jugular venous catheters in appropriate position. Electronically Signed   By: David  Martinique M.D.   On: 06/10/2015 07:10   Dg Chest Port 1 View  06/09/2015  CLINICAL DATA:  Chest tube in place. Right-sided chest tube was incidentally removed and replaced. Check placement. EXAM: PORTABLE CHEST 1 VIEW COMPARISON:  06/09/2015 FINDINGS: Endotracheal tube with tip measuring 4.1 cm above the carina. The enteric tube tip is positioned over the middle mediastinum consistent with location in the mid esophagus. Bilateral chest tubes appear to be in place. Right central venous catheter with tip over the upper SVC region. Extensive subcutaneous emphysema is demonstrated throughout the chest and in the neck. No definite pneumothorax although subcutaneous emphysema could obscure visualization of the small  pneumothorax. Lungs appear mostly expanded. Atelectasis in the lung bases. Mild cardiac enlargement. Shallow inspiration. IMPRESSION: Appliances positioned as described with enteric tube tip likely in the mid esophagus. Cardiac enlargement. Atelectasis in the lung bases. Diffuse  subcutaneous emphysema. Electronically Signed   By: Lucienne Capers M.D.   On: 06/09/2015 21:12   Dg Chest Port 1 View  06/09/2015  CLINICAL DATA:  Nasogastric tube placement EXAM: PORTABLE CHEST 1 VIEW COMPARISON:  Study obtained earlier in the day FINDINGS: The nasogastric tube tip is in the distal esophagus with the side port in the mid esophageal region. Endotracheal tube tip is 2.8 cm above the carina. Central catheter tip is in the superior vena cava. Chest tube on the left is again noted. There is a rather minimal lateral pneumothorax on the left without tension component. Extensive subcutaneous air bilaterally is again noted. There is patchy bibasilar atelectasis. No new parenchymal lung opacity is seen. Heart is borderline enlarged with pulmonary vascularity within normal limits. No adenopathy evident. IMPRESSION: Nasogastric tube with tip in distal esophagus. Advise advancing nasogastric tube approximately 20 cm to confirm that the nasogastric tube tip and side port are well within the stomach. Other tubes and catheters as described. Minimal lateral pneumothorax on the left without tension component. Extensive subcutaneous air remains diffusely. There is patchy atelectasis bilaterally without new opacity. No change in cardiac silhouette. These results will be called to the ordering clinician or representative by the Radiologist Assistant, and communication documented in the PACS or zVision Dashboard. Electronically Signed   By: Lowella Grip III M.D.   On: 06/09/2015 13:19   Dg Chest Port 1 View  06/09/2015  CLINICAL DATA:  Shortness of breath.  Chest tubes. EXAM: PORTABLE CHEST 1 VIEW COMPARISON:  06/08/2015. FINDINGS:  Endotracheal tube, right IJ sheath, bilateral chest tubes in stable position. No pneumothorax identified. Extensive but improving diffuse neck and chest wall subcutaneous emphysema again noted. Stable cardiomegaly. Persistent bilateral airspace disease. No interim change. IMPRESSION: 1. Lines and tubes including bilateral chest tubes in stable position. No pneumothorax. Extensive but improving diffuse chest wall and neck subcutaneous emphysema. 2. Persistent unchanged bilateral airspace disease. Stable cardiomegaly. Electronically Signed   By: Marcello Moores  Register   On: 06/09/2015 07:37   Dg Chest Port 1 View  06/08/2015  CLINICAL DATA:  Post chest tube insertion EXAM: PORTABLE CHEST 1 VIEW COMPARISON:  06/08/2015 dated 1453 hours FINDINGS: Extensive subcutaneous emphysema overlying the bilateral chest and along the lateral chest wall/axilla. Indwelling bilateral chest tubes.  No definite pneumothorax is seen. Mild patchy opacity at the left lung base, likely compressive atelectasis. Endotracheal tube terminates 3 cm above the carina. Cardiomegaly. Right IJ venous catheter terminates in the upper SVC. IMPRESSION: Indwelling bilateral chest tubes.  No definite pneumothorax is seen. Extensive subcutaneous emphysema. Endotracheal tube terminates 3 cm above the carina. Electronically Signed   By: Julian Hy M.D.   On: 06/08/2015 17:20   Dg Chest Port 1 View  06/08/2015  CLINICAL DATA:  75 year old who underwent transcatheter embolization of a right lower pole renal artery due to acute hemorrhage. Ventilator dependent respiratory failure. Followup subcutaneous emphysema. EXAM: PORTABLE CHEST 1 VIEW 2:53 p.m.: COMPARISON:  Portable chest x-ray earlier today 8:11 a.m. and previously. FINDINGS: Since examination earlier today, marked progression of now severe diffuse subcutaneous emphysema throughout the chest wall and neck. Endotracheal tube tip remains in satisfactory position projecting approximately 3 cm above  the carina. Right subclavian central venous catheter tip projects over the upper SVC, unchanged. External pacing pads. Cardiac silhouette mildly to moderately enlarged, unchanged. Interval development of patchy opacities involving the lung bases, right greater than left, since earlier today. Pulmonary vascularity normal. IMPRESSION: 1. Marked progression of now severe diffuse  subcutaneous emphysema throughout the chest wall and neck. 2. Support apparatus satisfactory. 3. Developing atelectasis and/or pneumonia involving the lung bases, right greater than left. 4. Stable cardiomegaly without pulmonary edema. Electronically Signed   By: Evangeline Dakin M.D.   On: 06/08/2015 15:11   Portable Chest Xray  06/08/2015  CLINICAL DATA:  Hypoxia EXAM: PORTABLE CHEST 1 VIEW COMPARISON:  June 02, 2015 FINDINGS: Endotracheal tube tip is 2.1 cm above the carina. Right central catheter tip is in the superior vena cava. No pneumothorax is evident. However, there is extensive soft tissue air in the left lateral hemithorax region. There is interstitial and patchy alveolar edema throughout the lungs bilaterally, slightly increased. There is cardiomegaly with mild pulmonary venous hypertension. No adenopathy evident. IMPRESSION: Tube and catheter positions as described. There is extensive soft tissue air in the left chest without pneumothorax appreciable on this supine examination. Evidence of underlying congestive heart failure. Superimposed bibasilar pneumonia cannot be excluded radiographically. Electronically Signed   By: Lowella Grip III M.D.   On: 06/08/2015 08:30   Dg Abd Portable 1v  06/09/2015  CLINICAL DATA:  Status post nasogastric catheter placement EXAM: PORTABLE ABDOMEN - 1 VIEW COMPARISON:  None. FINDINGS: Scattered large and small bowel gas is noted. The nasogastric catheter is again seen in the distal esophagus stable from the recent chest x-ray. Significant subcutaneous emphysema is noted. Changes of recent  renal embolization on the right are again seen. IMPRESSION: Nasogastric catheter within the distal esophagus. Electronically Signed   By: Inez Catalina M.D.   On: 06/09/2015 14:51   Dg Abd Portable 1v  06/09/2015  CLINICAL DATA:  Check feeding tube placement EXAM: PORTABLE ABDOMEN - 1 VIEW COMPARISON:  None. FINDINGS: Scattered large and small bowel gas is noted. Subcutaneous emphysema is noted similar to that seen on recent chest x-ray. A feeding catheter is not identified at this time. Changes of recent renal embolization on the right are seen. No new focal abnormality is noted per IMPRESSION: No feeding catheter is identified. Subcutaneous emphysema. Electronically Signed   By: Inez Catalina M.D.   On: 06/09/2015 13:09   [place] Guide Roadmapping  06/08/2015  INDICATION: Large acute right perinephric hematoma, requiring cardiopulmonary resuscitation EXAM: IR RENAL SUPRASEL UNILATERAL S+I MODERATE SEDATION; ARTERIOGRAPHY; IR EMBO ART VEN HEMORR LYMPH EXTRAV INC GUIDE ROADMAPPING; IR ULTRASOUND GUIDANCE VASC ACCESS RIGHT MEDICATIONS: 1% lidocaine locally. ANESTHESIA/SEDATION: Patient is already ventilated and sedated. The patient's level of consciousness and vital signs were monitored continuously by radiology nursing throughout the procedure under my direct supervision. CONTRAST:  75cc Isovue 300 FLUOROSCOPY TIME:  Fluoroscopy Time: 5 minutes 30 seconds (1,687 mGy). COMPLICATIONS: None immediate. PROCEDURE: Informed consent was obtained from the patient family following explanation of the procedure, risks, benefits and alternatives. The patient understands, agrees and consents for the procedure. All questions were addressed. A time out was performed prior to the initiation of the procedure. Maximal barrier sterile technique utilized including caps, mask, sterile gowns, sterile gloves, large sterile drape, hand hygiene, and Betadine prep. Under sterile conditions and local  anesthesia, ultrasound micropuncture access performed of the patent left common femoral artery. Five fr sheath inserted over a Bentson guidewire. C2 catheter advanced into the right renal artery. Catheter advanced into the main right renal artery over a Glidewire. Selective right renal angiogram performed. Right renal angiogram: Main right renal artery is patent. Anterior and posterior divisions are patent. Segmental branch to the lower pole demonstrates  a small peripheral focal arterial blush / pseudo aneurysm compatible with an active bleeding site. The right renal segmental and intraparenchymal branches demonstrate diffuse vaso constriction related to resuscitation. C2 catheter was advanced into the right renal lower pole segmental branch over a Glidewire. Selective angiogram performed of the right kidney lower pole branch. This is the dominant arterial branch feeding the peripheral right renal bleeding site. Embolization: From this location, 035 3 mm M rey Cook coils (2) and 035 6 mm Nester coils (2) were deployed within the right lower pole segmental branch. Post embolization angiogram confirms complete occlusion of the right lower pole branch. Final right renal angiogram demonstrates no further active bleeding or other sites. Access removed. Left femoral sheet secured externally to an arterial flushed because of coagulopathy. IMPRESSION: Peripheral right kidney lower pole bleeding site localized. Segmental right lower pole feeding artery was catheterized with a 5 French C2 catheter for successful complete coil embolization as above. Electronically Signed   By: Jerilynn Mages.  Shick M.D.   On: 06/08/2015 10:25    Labs:  CBC:  Recent Labs  06/10/15 0305 06/10/15 1000 06/10/15 1830 06/11/15 0518  WBC 31.3* 24.6* 24.4* 22.1*  HGB 7.8* 8.1* 7.3* 7.1*  HCT 22.9* 24.2* 22.0* 21.6*  PLT 128* 115* 119* 104*    COAGS:  Recent Labs  06/06/15 0210 06/08/15 0615 06/08/15 1023 06/08/15 1954 06/09/15 1000  06/10/15 0305 06/11/15 0518  INR 2.04* 2.80* 3.43* 2.77* 2.59* 2.23* 1.83*  APTT 31 35 47* 33  --   --   --     BMP:  Recent Labs  06/10/15 1122 06/10/15 1704 06/10/15 2308 06/11/15 0518  NA 137 137 136 138  K 4.7 4.6 4.9 4.7  CL 104 105 103 102  CO2 23 23 22 23   GLUCOSE 163* 163* 160* 158*  BUN 32* 29* 28* 27*  CALCIUM 7.4* 7.4* 7.6* 7.6*  CREATININE 2.58* 2.40* 2.33* 2.26*  GFRNONAA 23* 25* 26* 27*  GFRAA 27* 29* 30* 31*    LIVER FUNCTION TESTS:  Recent Labs  04/29/15 1545 06/01/15 1043  06/06/15 0210  06/09/15 1128 06/09/15 1641 06/10/15 0305 06/10/15 1704 06/11/15 0518  BILITOT 0.7 1.5*  --  0.6  --  2.2*  --   --   --   --   AST 26  --   --  18  --  1199*  --   --   --   --   ALT 20  --   --  15*  --  527*  --   --   --   --   ALKPHOS 29*  --   --  64  --  107  --   --   --   --   PROT 6.4*  --   --  5.2*  --  4.6*  --   --   --   --   ALBUMIN 3.6  --   < > 1.9*  1.8*  < > 2.2* 2.0* 2.1* 2.0* 1.8*  < > = values in this interval not displayed.  Assessment and Plan: Pt with large acute rt perinephric hematoma, s/p coil embo of segmental RLP feeding artery 5/3; AF; BP soft; creat 2.26 sl improving; PT 21.1/INR 1.83, WBC 22.1, HGB 7.1(7.3), PLTS 104K; blood cx- klebsiella; CXR with no ptx; ext SQ emphysema; plan sheath removal once INR near 1.6   Electronically Signed: D. Rowe Robert 06/11/2015, 12:24 PM   I spent a total of 20 minutes  at the the patient's bedside AND on the patient's hospital floor or unit, greater than 50% of which was counseling/coordinating care for rt perinephric hematoma embolization

## 2015-06-11 NOTE — Progress Notes (Signed)
PULMONARY / CRITICAL CARE MEDICINE   Name: [name] MRN: [id number] DOB: Dec 05, 1940    ADMISSION DATE:  05/31/2015 CONSULTATION DATE:  4/25  REFERRING MD:  EDP  CHIEF COMPLAINT:  Hypotension   HISTORY OF PRESENT ILLNESS:   75yo male with hx PE, OSA, CKD III (L nephrectomy r/t RCC), DM initially admitted 4/25 with sepsis r/t UTI.  Admitted by PCCM, requiring vasopressors.  He improved slowly and was tx out to med-surg on 4/30.  On 5/2 developed R flank pain and CT abd/pelvis revealed increased R perinephric hematoma worsened by heparin gtt and pt developed worsening hypotension requiring tx back to ICU and vasopressors.  Pt continued to deteriorate overnight and ultimately had cardiac arrest am 5/3 with approx 12 mins CPR.  Multiple blood products given.  Pt taken to IR for embolization of R perinephric hematoma once stabilized.  Course now c/b AKI on CKD requiring CVVHD, bilat chest tubes r/t significant sub-q emphysema post CPR.    SUBJECTIVE:  On vent On vaso, levo at 14  VITAL SIGNS: BP 111/59 mmHg  Pulse 74  Temp(Src) 97.6 F (36.4 C) (Oral)  Resp 15  Ht 5\' 9"  (1.753 m)  Wt 142.4 kg (313 lb 15 oz)  BMI 46.34 kg/m2  SpO2 100%  HEMODYNAMICS: CVP:  [1 mmHg-6 mmHg] 5 mmHg  VENTILATOR SETTINGS: Vent Mode:  [-] PRVC FiO2 (%):  [40 %] 40 % Set Rate:  [12 bmp] 12 bmp Vt Set:  [570 mL] 570 mL PEEP:  [5 cmH20] 5 cmH20 Plateau Pressure:  [16 cmH20-22 cmH20] 22 cmH20  INTAKE / OUTPUT: I/O last 3 completed shifts: In: 2862.3 [I.V.:2312.3; IV Piggyback:550] Out: 2909 [Other:2569; Chest Tube:340]  PHYSICAL EXAMINATION: General:  Chronically ill appearing male, acutely ill  Neuro:  Sedated on vent, RASS -1, FC HEENT:  Mm moist, no JVD, ETT  Cardiovascular:  s1s2 rrr Lungs:  Coarse, ct wnl, no leaks Abdomen:  Round, distended, hypoactive bs  Musculoskeletal:  Warm and dry, 1+ BLE edema   LABS:  BMET  Recent Labs Lab 06/10/15 1704 06/10/15 2308 06/11/15 0518  NA  137 136 138  K 4.6 4.9 4.7  CL 105 103 102  CO2 23 22 23   BUN 29* 28* 27*  CREATININE 2.40* 2.33* 2.26*  GLUCOSE 163* 160* 158*    Electrolytes  Recent Labs Lab 06/10/15 1000  06/10/15 1704 06/10/15 2308 06/11/15 0003 06/11/15 0518  CALCIUM  --   < > 7.4* 7.6*  --  7.6*  MG 2.2  --   --   --  2.3 2.3  PHOS 4.2  --  3.9  --  4.0 4.2  < > = values in this interval not displayed.  CBC  Recent Labs Lab 06/10/15 1000 06/10/15 1830 06/11/15 0518  WBC 24.6* 24.4* 22.1*  HGB 8.1* 7.3* 7.1*  HCT 24.2* 22.0* 21.6*  PLT 115* 119* 104*    Coag's  Recent Labs Lab 06/08/15 0615 06/08/15 1023 06/08/15 1954 06/09/15 1000 06/10/15 0305 06/11/15 0518  APTT 35 47* 33  --   --   --   INR 2.80* 3.43* 2.77* 2.59* 2.23* 1.83*    Sepsis Markers  Recent Labs Lab 06/09/15 0400 06/09/15 1128 06/09/15 1641 06/10/15 0305 06/11/15 0518  LATICACIDVEN 9.9* 6.4* 3.3*  --   --   PROCALCITON  --  20.49  --  14.32 12.01    ABG  Recent Labs Lab 06/09/15 1246 06/09/15 1744 06/10/15 0811  PHART 7.541* 7.427 7.420  PCO2ART  23.5* 33.1* 39.0  PO2ART 81.0 91.0 124.0*    Liver Enzymes  Recent Labs Lab 06/06/15 0210  06/09/15 1128  06/10/15 0305 06/10/15 1704 06/11/15 0518  AST 18  --  1199*  --   --   --   --   ALT 15*  --  527*  --   --   --   --   ALKPHOS 64  --  107  --   --   --   --   BILITOT 0.6  --  2.2*  --   --   --   --   ALBUMIN 1.9*  1.8*  < > 2.2*  < > 2.1* 2.0* 1.8*  < > = values in this interval not displayed.  Cardiac Enzymes  Recent Labs Lab 06/08/15 0808 06/08/15 1410 06/08/15 1852  TROPONINI 0.08* 0.32* 0.27*    Glucose  Recent Labs Lab 06/10/15 1106 06/10/15 1534 06/10/15 1939 06/10/15 2318 06/11/15 0328 06/11/15 0752  GLUCAP 155* 158* 118* 153* 138* 156*    Imaging Dg Chest Port 1 View  06/11/2015  CLINICAL DATA:  Pneumothorax after CPR. EXAM: PORTABLE CHEST 1 VIEW COMPARISON:  Yesterday FINDINGS: Endotracheal tube tip is  between the clavicular heads and carina. Right IJ central line with tip near the SVC origin. Bilateral chest tubes in stable position. No convincing pneumothorax. Left-sided rib fractures without gross increase in displacement. Dense opacity behind the heart with air bronchograms. Diffuse chest wall emphysema which is decreasing. IMPRESSION: 1. Stable positioning of tubes and central line. 2. No visible pneumothorax. Extensive chest wall emphysema is gradually decreasing. 3. Retrocardiac atelectasis or pneumonia. Electronically Signed   By: Monte Fantasia M.D.   On: 06/11/2015 07:15     STUDIES:  CXR 04/25 > Low volumes. LLL opacity may reflect atelectasis or infiltrate. Renal US 04/25 > surgically absent left kidney, no hydronephrosis, small right renal cyst. CT A / P 05/02 > large right perinephric hematoma demonstrating increase in size since previous study.  CULTURES: Blood 04/25 > Klebsiella  Urine 04/25 > Multiple species present MRSA nares > negative Blood 04/27 > NGTD  ANTIBIOTICS: Ceftaz 04/25 >off Vanc 04/25 > 04/25 Ceftriaxone 04/29 >  SIGNIFICANT EVENTS: 04/25 > admitted with septic shock due to UTI. Significant ARF. 04/30 > transferred out of ICU. 05/03 > transferred back to ICU due to acute blood loss anemia presumed due to worsening right perinephric hematoma, hypotension. 5/3>> cardiac arrest  5/3>> IR embolization R perinephric hematoma  5/3- PTX left , bilateral chest tubes for massive sub q air - from cpr  LINES/TUBES: R IJ HD cath 04/26 > ETT 5/3>>>  L femoral CVL 5/3>>> L femoral sheath 5/3>>> L chest tube 5/3>>> R chest tube 5/3>>>  DISCUSSION: 75 y.o. M with hx RCC and prostate CA s/p radiation 2011 and left nephrectomy with increased INR, ARF, perinephric hematoma.  ASSESSMENT / PLAN:  CARDIOVASCULAR A:  Hemorrhagic Shock - right perinephric hematoma +/- sepsis r/t klebsiella UTI  Hx a.fib (on coumadin), HLD, HTN. Cardiogenic component? CI  borderline on NONinvasive P:  Net even on CVVHD  Levophed as needed to maintain goal MAP > 55 with good MS If levo to 5 then adc vaso cvp 1 and SV 16%, consider repeat bolus  PULMONARY A: Acute hypoxic respiratory failure. 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 Respiratory alkalosis  Extensive Sub-q emphysema - s/p bilat chest tubes  P:  Levo at 15, can  SBT, cpap 5 ps 5, goal 2 hrs, unlikely to extubate CT to suction remain with air noted on films pcxr in am   HEMATOLOGIC / ONCOLOGIC A:  Acute blood loss anemia/hemorrhagic shock - worsening / increase in size of right perinephric hematoma - s/p IR embolization  Coagulopathy - s/p multiple units FFP and vitamin K. 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 CKD III -- baseline Scr ~1.9 Chronic foley w/hematuria at admission - improving, likely 2/2 infx Perinephric hematoma - etiology could be benign cause, idiopathic or recurrence of malignancy given his RCC hx; increased in size 05/02. P:  Cont CVVHD See cvs, will bolus once   INFECTIOUS A:  Septic shock - due to klebsiella UTI/ bacteremia. Leukocytosis -- LIKELY all related to bleed P:  Continue ceftriaxone, will need to extend duration, will add stop date to 14 days  GASTROINTESTINAL A:  Nutrition. P:  IR needed for tube ppi  ENDOCRINE A:  DM. P:  Continue SSI.  NEUROLOGIC A:  Pain Sedation needs on vent  Follows commands - mental status encouraging s/p arrest/ongoing shock  P:  Fentanyl gtt , WUA Rass goal -1   FAMILY  - Updates:  Updated wife at length at bedside.  daily  - Inter-disciplinary family meet or Palliative Care meeting due by:  day 7  Ccm  time 30 min   Lavon Paganini. Titus Mould, MD, [place] Pgr: [facility] 06/11/2015 11:05 AM

## 2015-06-11 NOTE — Progress Notes (Signed)
Assessment/ Plan: Pt is a 75 y.o. yo male who was admitted on 05/31/2015 with septic shock 2/2 klebsiella bacteremia. Readmitted to the ICU 06/08/2015 with hemorrhagic shock in the setting of a right perinephric hematoma. Now s/p coil embolization.  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 Patient's Cr was notably elevated at 9.33, his baseline is thought to be around 1.7-1.9.   1. AKI/CKD, patient s/p left nephrectomy and now s/p partial right renal artery coil embolization 2/2 perinephric hematoma. Anuric.  - On CVVHD given tenuous hemodynamic status - goal net even 2. Hemorrhagic shock 2/2 Acute blood loss anemia 2/2 right perinephric hematoma. Also with coagulopathy, s/p FFP and vit K  - s/p coil embolization by IR  - On pressors per CCM  - Trend CBC, will give 2U pRBC this morning  - Trend INR 3. Urosepsis / Positive blood culture pan-sensitive Kleb Pneumo. abx per primary team.   Renal Attending: Pt remains anuric post hemorraghic shock and coil deployment to inferior pole of solitary kidney.  Cont CRRT.  Keeping even and giving PRBCs today. [name]   Subjective: Currently intubated and sedated. Follows some commands  Objective: Vital signs in last 24 hours: Temp:  [97.4 F (36.3 Villegas)-98.3 F (36.8 Villegas)] 97.6 F (36.4 Villegas) (05/06 0744) Pulse Rate:  [36-93] 74 (05/06 0846) Resp:  [12-18] 15 (05/06 0846) BP: (63-111)/(26-69) 111/59 mmHg (05/06 0846) SpO2:  [100 %] 100 % (05/06 0846) Arterial Line BP: (91-117)/(46-63) 95/50 mmHg (05/06 0630) FiO2 (%):  [40 %] 40 % (05/06 0846) Weight:  [313 lb 15 oz (142.4 kg)] 313 lb 15 oz (142.4 kg) (05/06 0456) Weight change: -14.1 oz (-0.4 kg)  Intake/Output from previous day: 05/05 0701 - 05/06 0700 In: 1969.4 [I.V.:1419.4; IV Piggyback:550] Out: 2202 [Chest Tube:180] Intake/Output this shift:    General  appearance: Sedated and intubated Resp: intubated, Coarse breath sounds bilaterally, chest tubes in places bilaterally Cardio: RRR GI: S, NT, ND Ext: 2+ bilateral LE edema Neuro: Sedated, Follows some commands.   Lab Results:  Recent Labs  06/10/15 1830 06/11/15 0518  WBC 24.4* 22.1*  HGB 7.3* 7.1*  HCT 22.0* 21.6*  PLT 119* 104*   BMET:   Recent Labs  06/10/15 2308 06/11/15 0518  NA 136 138  K 4.9 4.7  CL 103 102  CO2 22 23  GLUCOSE 160* 158*  BUN 28* 27*  CREATININE 2.33* 2.26*  CALCIUM 7.6* 7.6*   No results for input(s): PTH in the last 72 hours. Iron Studies: No results for input(s): IRON, TIBC, TRANSFERRIN, FERRITIN in the last 72 hours. Studies/Results: Dg Chest Port 1 View  06/11/2015  CLINICAL DATA:  Pneumothorax after CPR. EXAM: PORTABLE CHEST 1 VIEW COMPARISON:  Yesterday FINDINGS: Endotracheal tube tip is between the clavicular heads and carina. Right IJ central line with tip near the SVC origin. Bilateral chest tubes in stable position. No convincing pneumothorax. Left-sided rib fractures without gross increase in displacement. Dense opacity behind the heart with air bronchograms. Diffuse chest wall emphysema which is decreasing. IMPRESSION: 1. Stable positioning of tubes and central line. 2. No visible pneumothorax. Extensive chest wall emphysema is gradually decreasing. 3. Retrocardiac atelectasis or pneumonia. Electronically Signed   By: Monte Fantasia M.D.   On: 06/11/2015 07:15   Dg Chest Portable 1 View  06/10/2015  CLINICAL DATA:  Acute respiratory failure, septic shock. EXAM: PORTABLE CHEST 1 VIEW COMPARISON:  Portable  chest x-ray of Jun 09, 2015 FINDINGS: The lungs are slightly less well inflated today. Extensive subcutaneous emphysema persists bilaterally. Classic pleural lines of pneumothoraces are not evident. There may be pleural space air projecting over the lateral aspect of the left third rib however. On the right and no pneumothorax is evident. The  cardiac silhouette remains enlarged. The pulmonary vascularity is engorged but slightly improved in appearance overall. The endotracheal tube tip lies approximately 4 cm above the carina. The esophagogastric tube tip projects in the distal third of the esophagus. The large caliber right internal jugular venous catheter tip projects over the proximal SVC. The left-sided chest tube tip projects over the posterior medial aspect of the fourth rib. IMPRESSION: 1. Probable small left apical pneumothorax but a classic pleural line is not observed. The left-sided chest tube is in stable position. The subcutaneous emphysema persists but is slightly less conspicuous overall. No right-sided pneumothorax. A right chest tube is in stable position with the tip overlying the posterior- medial aspect of the right seventh rib. 2. Congestive heart failure with slight decrease in pulmonary edema and vascular congestion. 3. Persistent unsatisfactory positioning of the esophagogastric tube with the tip and proximal port lying in the distal and mid esophagus respectively. 4. Endotracheal tube, bilateral chest tubes, and right internal jugular venous catheters in appropriate position. Electronically Signed   By: David  Martinique M.D.   On: 06/10/2015 07:10   Dg Chest Port 1 View  06/09/2015  CLINICAL DATA:  Chest tube in place. Right-sided chest tube was incidentally removed and replaced. Check placement. EXAM: PORTABLE CHEST 1 VIEW COMPARISON:  06/09/2015 FINDINGS: Endotracheal tube with tip measuring 4.1 cm above the carina. The enteric tube tip is positioned over the middle mediastinum consistent with location in the mid esophagus. Bilateral chest tubes appear to be in place. Right central venous catheter with tip over the upper SVC region. Extensive subcutaneous emphysema is demonstrated throughout the chest and in the neck. No definite pneumothorax although subcutaneous emphysema could obscure visualization of the small pneumothorax.  Lungs appear mostly expanded. Atelectasis in the lung bases. Mild cardiac enlargement. Shallow inspiration. IMPRESSION: Appliances positioned as described with enteric tube tip likely in the mid esophagus. Cardiac enlargement. Atelectasis in the lung bases. Diffuse subcutaneous emphysema. Electronically Signed   By: Lucienne Capers M.D.   On: 06/09/2015 21:12   Dg Chest Port 1 View  06/09/2015  CLINICAL DATA:  Nasogastric tube placement EXAM: PORTABLE CHEST 1 VIEW COMPARISON:  Study obtained earlier in the day FINDINGS: The nasogastric tube tip is in the distal esophagus with the side port in the mid esophageal region. Endotracheal tube tip is 2.8 cm above the carina. Central catheter tip is in the superior vena cava. Chest tube on the left is again noted. There is a rather minimal lateral pneumothorax on the left without tension component. Extensive subcutaneous air bilaterally is again noted. There is patchy bibasilar atelectasis. No new parenchymal lung opacity is seen. Heart is borderline enlarged with pulmonary vascularity within normal limits. No adenopathy evident. IMPRESSION: Nasogastric tube with tip in distal esophagus. Advise advancing nasogastric tube approximately 20 cm to confirm that the nasogastric tube tip and side port are well within the stomach. Other tubes and catheters as described. Minimal lateral pneumothorax on the left without tension component. Extensive subcutaneous air remains diffusely. There is patchy atelectasis bilaterally without new opacity. No change in cardiac silhouette. These results will be called to the ordering clinician or representative by the Radiologist Assistant,  and communication documented in the PACS or zVision Dashboard. Electronically Signed   By: Lowella Grip III M.D.   On: 06/09/2015 13:19   Dg Abd Portable 1v  06/09/2015  CLINICAL DATA:  Status post nasogastric catheter placement EXAM: PORTABLE ABDOMEN - 1 VIEW COMPARISON:  None. FINDINGS: Scattered  large and small bowel gas is noted. The nasogastric catheter is again seen in the distal esophagus stable from the recent chest x-ray. Significant subcutaneous emphysema is noted. Changes of recent renal embolization on the right are again seen. IMPRESSION: Nasogastric catheter within the distal esophagus. Electronically Signed   By: Inez Catalina M.D.   On: 06/09/2015 14:51   Dg Abd Portable 1v  06/09/2015  CLINICAL DATA:  Check feeding tube placement EXAM: PORTABLE ABDOMEN - 1 VIEW COMPARISON:  None. FINDINGS: Scattered large and small bowel gas is noted. Subcutaneous emphysema is noted similar to that seen on recent chest x-ray. A feeding catheter is not identified at this time. Changes of recent renal embolization on the right are seen. No new focal abnormality is noted per IMPRESSION: No feeding catheter is identified. Subcutaneous emphysema. Electronically Signed   By: Inez Catalina M.D.   On: 06/09/2015 13:09    Scheduled: . sodium chloride   Intravenous Once  . antiseptic oral rinse  7 mL Mouth Rinse QID  . cefTRIAXone (ROCEPHIN)  IV  2 g Intravenous Q24H  . chlorhexidine gluconate (SAGE KIT)  15 mL Mouth Rinse BID  . sennosides  5 mL Oral BID   And  . docusate  100 mg Oral BID  . feeding supplement (PRO-STAT SUGAR FREE 64)  30 mL Per Tube TID  . feeding supplement (VITAL HIGH PROTEIN)  1,000 mL Per Tube Q24H  . fentaNYL (SUBLIMAZE) injection  50 mcg Intravenous Once  . insulin aspart  0-15 Units Subcutaneous Q4H  . pantoprazole (PROTONIX) IV  40 mg Intravenous Q24H  . sodium chloride flush  10-40 mL Intracatheter Q12H    LOS: 11 days   Caleb Parker 06/11/2015,8:52 AM

## 2015-06-12 ENCOUNTER — Inpatient Hospital Stay (HOSPITAL_COMMUNITY): Payer: BLUE CROSS/BLUE SHIELD

## 2015-06-12 DIAGNOSIS — J9601 Acute respiratory failure with hypoxia: Secondary | ICD-10-CM

## 2015-06-12 DIAGNOSIS — J9602 Acute respiratory failure with hypercapnia: Secondary | ICD-10-CM

## 2015-06-12 DIAGNOSIS — N178 Other acute kidney failure: Secondary | ICD-10-CM

## 2015-06-12 LAB — MAGNESIUM
MAGNESIUM: 2.2 mg/dL (ref 1.7–2.4)
MAGNESIUM: 2.3 mg/dL (ref 1.7–2.4)

## 2015-06-12 LAB — CBC WITH DIFFERENTIAL/PLATELET
Basophils Absolute: 0 10*3/uL (ref 0.0–0.1)
Basophils Relative: 0 %
EOS PCT: 0 %
Eosinophils Absolute: 0 10*3/uL (ref 0.0–0.7)
HCT: 26.4 % — ABNORMAL LOW (ref 39.0–52.0)
Hemoglobin: 8.5 g/dL — ABNORMAL LOW (ref 13.0–17.0)
LYMPHS PCT: 2 %
Lymphs Abs: 0.4 10*3/uL — ABNORMAL LOW (ref 0.7–4.0)
MCH: 28.6 pg (ref 26.0–34.0)
MCHC: 32.2 g/dL (ref 30.0–36.0)
MCV: 88.9 fL (ref 78.0–100.0)
MONO ABS: 1.3 10*3/uL — AB (ref 0.1–1.0)
Monocytes Relative: 7 %
Neutro Abs: 16.7 10*3/uL — ABNORMAL HIGH (ref 1.7–7.7)
Neutrophils Relative %: 91 %
PLATELETS: 82 10*3/uL — AB (ref 150–400)
RBC: 2.97 MIL/uL — ABNORMAL LOW (ref 4.22–5.81)
RDW: 16.2 % — AB (ref 11.5–15.5)
WBC: 18.5 10*3/uL — ABNORMAL HIGH (ref 4.0–10.5)

## 2015-06-12 LAB — BLOOD GAS, ARTERIAL
Acid-base deficit: 3.9 mmol/L — ABNORMAL HIGH (ref 0.0–2.0)
Bicarbonate: 21.3 mEq/L (ref 20.0–24.0)
DRAWN BY: 24513
FIO2: 0.4
MECHVT: 570 mL
O2 Saturation: 98 %
PEEP: 5 cmH2O
Patient temperature: 98.6
RATE: 12 resp/min
TCO2: 22.7 mmol/L (ref 0–100)
pCO2 arterial: 43.7 mmHg (ref 35.0–45.0)
pH, Arterial: 7.31 — ABNORMAL LOW (ref 7.350–7.450)
pO2, Arterial: 114 mmHg — ABNORMAL HIGH (ref 80.0–100.0)

## 2015-06-12 LAB — GLUCOSE, CAPILLARY
GLUCOSE-CAPILLARY: 145 mg/dL — AB (ref 65–99)
GLUCOSE-CAPILLARY: 119 mg/dL — AB (ref 65–99)
GLUCOSE-CAPILLARY: 139 mg/dL — AB (ref 65–99)
GLUCOSE-CAPILLARY: 127 mg/dL — AB (ref 65–99)
Glucose-Capillary: 146 mg/dL — ABNORMAL HIGH (ref 65–99)
Glucose-Capillary: 153 mg/dL — ABNORMAL HIGH (ref 65–99)

## 2015-06-12 LAB — RENAL FUNCTION PANEL
ALBUMIN: 1.7 g/dL — AB (ref 3.5–5.0)
ALBUMIN: 1.7 g/dL — AB (ref 3.5–5.0)
ANION GAP: 14 (ref 5–15)
Anion gap: 13 (ref 5–15)
BUN: 22 mg/dL — AB (ref 6–20)
BUN: 19 mg/dL (ref 6–20)
CALCIUM: 7.8 mg/dL — AB (ref 8.9–10.3)
CALCIUM: 7.8 mg/dL — AB (ref 8.9–10.3)
CHLORIDE: 104 mmol/L (ref 101–111)
CO2: 20 mmol/L — ABNORMAL LOW (ref 22–32)
CO2: 21 mmol/L — ABNORMAL LOW (ref 22–32)
CREATININE: 2.1 mg/dL — AB (ref 0.61–1.24)
Chloride: 103 mmol/L (ref 101–111)
Creatinine, Ser: 1.87 mg/dL — ABNORMAL HIGH (ref 0.61–1.24)
GFR calc Af Amer: 34 mL/min — ABNORMAL LOW (ref >60)
GFR, EST AFRICAN AMERICAN: 39 mL/min — AB (ref >60)
GFR, EST NON AFRICAN AMERICAN: 30 mL/min — AB (ref >60)
GFR, EST NON AFRICAN AMERICAN: 34 mL/min — AB (ref >60)
Glucose, Bld: 159 mg/dL — ABNORMAL HIGH (ref 65–99)
Glucose, Bld: 156 mg/dL — ABNORMAL HIGH (ref 65–99)
PHOSPHORUS: 3.5 mg/dL (ref 2.5–4.6)
Phosphorus: 3.8 mg/dL (ref 2.5–4.6)
Potassium: 4.9 mmol/L (ref 3.5–5.1)
Potassium: 4.7 mmol/L (ref 3.5–5.1)
SODIUM: 137 mmol/L (ref 135–145)
SODIUM: 138 mmol/L (ref 135–145)

## 2015-06-12 LAB — PROTIME-INR
INR: 1.79 — ABNORMAL HIGH (ref 0.00–1.49)
Prothrombin Time: 20.8 seconds — ABNORMAL HIGH (ref 11.6–15.2)

## 2015-06-12 LAB — CORTISOL: Cortisol, Plasma: 24.4 ug/dL

## 2015-06-12 LAB — PHOSPHORUS: Phosphorus: 3.8 mg/dL (ref 2.5–4.6)

## 2015-06-12 MED ORDER — SODIUM CHLORIDE 0.9 % IV BOLUS (SEPSIS)
1000.0000 mL | Freq: Once | INTRAVENOUS | Status: AC
  Administered 2015-06-12: 1000 mL via INTRAVENOUS

## 2015-06-12 MED ORDER — BISACODYL 10 MG RE SUPP
10.0000 mg | Freq: Once | RECTAL | Status: AC
  Administered 2015-06-12: 10 mg via RECTAL
  Filled 2015-06-12: qty 1

## 2015-06-12 NOTE — Progress Notes (Signed)
Assessment/ Plan: Pt is a 75 y.o. yo male who was admitted on 05/31/2015 with septic shock 2/2 klebsiella bacteremia. Readmitted to the ICU 06/08/2015 with hemorrhagic shock in the setting of a right perinephric hematoma. Now s/p coil embolization. 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 Patient's Cr was notably elevated at 9.33, his baseline is thought to be around 1.7-1.9.   1. AKI/CKD, patient s/p left nephrectomy and now s/p partial right renal artery coil embolization 2/2 perinephric hematoma. Anuric. - On CVVHD - goal net even (unable to pull fluid d/t BP and low CVP) 2. Hemorrhagic shock 2/2 Acute blood loss anemia 2/2 right perinephric hematoma. Also with coagulopathy, s/p FFP and vit K - s/p coil embolization by IR 3. S/P presumed Urosepsis / Positive blood culture pan-sensitive Kleb Pneumo on Rocephin  Discussion: Pt remains anuric post hemorraghic shock and coil deployment to inferior pole of solitary kidney. Cont CRRT. Received volume and PRBCs recently due to IV vol depletion, now improved.   We will continue to support with CRRT while pressor dependent.   Prognosis for renal recovery is poor, in my opinion. POWELL,ALVIN C    Objective: Vital signs in last 24 hours: Temp:  [97.2 F (36.2 C)-97.5 F (36.4 C)] 97.2 F (36.2 C) (05/07 1224) Pulse Rate:  [63-99] 99 (05/07 1236) Resp:  [13-25] 25 (05/07 1236) BP: (45-116)/(17-103) 105/59 mmHg (05/07 1236) SpO2:  [93 %-100 %] 99 % (05/07 1236) Arterial Line BP: (86-113)/(44-70) 94/48 mmHg (05/07 0800) FiO2 (%):  [40 %] 40 % (05/07 1236) Weight change:   Intake/Output from previous day: 05/06 0701 - 05/07 0700 In: 2167.4 [I.V.:1187.4; Blood:930; IV Piggyback:50] Out: 5284 [Urine:10; Chest Tube:180] Intake/Output this shift: Total I/O In: 1700.5 [I.V.:1650.5; IV  Piggyback:50] Out: 184 [Other:184]  General appearance: VRF Resp: diminished breath sounds bilaterally GI: soft, obese Extremities: edema 1-2+ sedated  Lab Results:  Recent Labs  06/11/15 2000 06/12/15 0538  WBC 18.4* 18.5*  HGB 9.0* 8.5*  HCT 27.7* 26.4*  PLT 85* 82*   BMET:  Recent Labs  06/11/15 1600 06/12/15 0538  NA 136 137  K 4.8 4.9  CL 104 104  CO2 21* 20*  GLUCOSE 157* 159*  BUN 22* 22*  CREATININE 2.15* 2.10*  CALCIUM 7.7* 7.8*   No results for input(s): PTH in the last 72 hours. Iron Studies: No results for input(s): IRON, TIBC, TRANSFERRIN, FERRITIN in the last 72 hours. Studies/Results: Dg Chest Port 1 View  06/12/2015  CLINICAL DATA:  Acute respiratory failure. EXAM: PORTABLE CHEST 1 VIEW COMPARISON:  Chest radiograph 06/11/2015. FINDINGS: Right IJ central venous catheter tip projects in the superior vena cava. ETT terminates in the mid trachea. Bilateral chest tubes remain in place. Stable cardiomegaly. Low lung volumes. Stable retrocardiac opacities. No pneumothorax. Extensive chest wall emphysema. IMPRESSION: Stable support apparatus. Extensive chest wall emphysema. Atelectasis or infection left lung base. Electronically Signed   By: Lovey Newcomer M.D.   On: 06/12/2015 08:01   Dg Chest Port 1 View  06/11/2015  CLINICAL DATA:  Pneumothorax after CPR. EXAM: PORTABLE CHEST 1 VIEW COMPARISON:  Yesterday FINDINGS: Endotracheal tube tip is between the clavicular heads and carina. Right IJ central line with tip near the SVC origin. Bilateral chest tubes in stable position. No convincing pneumothorax. Left-sided rib fractures without gross increase in displacement. Dense opacity behind the heart with air bronchograms. Diffuse chest wall emphysema which is decreasing.  IMPRESSION: 1. Stable positioning of tubes and central line. 2. No visible pneumothorax. Extensive chest wall emphysema is gradually decreasing. 3. Retrocardiac atelectasis or pneumonia. Electronically  Signed   By: Monte Fantasia M.D.   On: 06/11/2015 07:15   Dg Abd Portable 1v  06/12/2015  CLINICAL DATA:  Evaluate for feeding tube placement. EXAM: PORTABLE ABDOMEN - 1 VIEW COMPARISON:  Abdominal radiograph 06/09/2015 FINDINGS: The distal aspect of the enteric tube projects over the mid esophagus. Surgical clips within the mid abdomen. Nonobstructed bowel gas pattern. IMPRESSION: The enteric tube is located within the midesophagus. This needs to be advanced. These results will be called to the ordering clinician or representative by the Radiologist Assistant, and communication documented in the PACS or zVision Dashboard. Electronically Signed   By: Lovey Newcomer M.D.   On: 06/12/2015 11:16    Scheduled: . sodium chloride   Intravenous Once  . antiseptic oral rinse  7 mL Mouth Rinse QID  . bisacodyl  10 mg Rectal Once  . cefTRIAXone (ROCEPHIN)  IV  2 g Intravenous Q24H  . chlorhexidine gluconate (SAGE KIT)  15 mL Mouth Rinse BID  . sennosides  5 mL Oral BID   And  . docusate  100 mg Oral BID  . feeding supplement (PRO-STAT SUGAR FREE 64)  30 mL Per Tube TID  . feeding supplement (VITAL HIGH PROTEIN)  1,000 mL Per Tube Q24H  . fentaNYL (SUBLIMAZE) injection  50 mcg Intravenous Once  . insulin aspart  0-15 Units Subcutaneous Q4H  . pantoprazole (PROTONIX) IV  40 mg Intravenous Q24H  . sodium chloride flush  10-40 mL Intracatheter Q12H    LOS: 12 days   POWELL,ALVIN C 06/12/2015,1:17 PM

## 2015-06-12 NOTE — Progress Notes (Addendum)
PULMONARY / CRITICAL CARE MEDICINE   Name: [name] MRN: [id number] DOB: 03/24/1940    ADMISSION DATE:  05/31/2015 CONSULTATION DATE:  4/25  REFERRING MD:  EDP  CHIEF COMPLAINT:  Hypotension   HISTORY OF PRESENT ILLNESS:   75yo male with hx PE, OSA, CKD III (L nephrectomy r/t RCC), DM initially admitted 4/25 with sepsis r/t UTI.  Admitted by PCCM, requiring vasopressors.  He improved slowly and was tx out to med-surg on 4/30.  On 5/2 developed R flank pain and CT abd/pelvis revealed increased R perinephric hematoma worsened by heparin gtt and pt developed worsening hypotension requiring tx back to ICU and vasopressors.  Pt continued to deteriorate overnight and ultimately had cardiac arrest am 5/3 with approx 12 mins CPR.  Multiple blood products given.  Pt taken to IR for embolization of R perinephric hematoma once stabilized.  Course now c/b AKI on CKD requiring CVVHD, bilat chest tubes r/t significant sub-q emphysema post CPR.    SUBJECTIVE:  Sub q air remains Vaso off Levophed down  VITAL SIGNS: BP 81/40 mmHg  Pulse 84  Temp(Src) 97.4 F (36.3 C) (Oral)  Resp 16  Ht 5\' 9"  (1.753 m)  Wt 142.4 kg (313 lb 15 oz)  BMI 46.34 kg/m2  SpO2 98%  HEMODYNAMICS: CVP:  [3 mmHg-7 mmHg] 7 mmHg  VENTILATOR SETTINGS: Vent Mode:  [-] PRVC FiO2 (%):  [40 %] 40 % Set Rate:  [12 bmp] 12 bmp Vt Set:  [570 mL] 570 mL PEEP:  [5 cmH20] 5 cmH20 Plateau Pressure:  [20 cmH20-26 cmH20] 25 cmH20  INTAKE / OUTPUT: I/O last 3 completed shifts: In: 2839.2 [I.V.:1859.2; Blood:930; IV Piggyback:50] Out: 1995 [Urine:10; Other:1685; Chest Tube:300]  PHYSICAL EXAMINATION: General:  Chronically ill appearing male, acutely ill  Neuro:  Sedated, RASS -2, int FC HEENT:  Mm moist, no JVD, ETT  Cardiovascular:  s1s2 rr not tachy Lungs:  Coarse, sub q on chest, NO leaks Abdomen: distended, hypoactive bs, no r/g Musculoskeletal:  Warm and dry, 2+ BLE edema   LABS:  BMET  Recent Labs Lab  06/11/15 0518 06/11/15 1600 06/12/15 0538  NA 138 136 137  K 4.7 4.8 4.9  CL 102 104 104  CO2 23 21* 20*  BUN 27* 22* 22*  CREATININE 2.26* 2.15* 2.10*  GLUCOSE 158* 157* 159*    Electrolytes  Recent Labs Lab 06/11/15 0518 06/11/15 1046 06/11/15 1600 06/11/15 2340 06/12/15 0538  CALCIUM 7.6*  --  7.7*  --  7.8*  MG 2.3 2.3  --  2.3 2.2  PHOS 4.2 3.8 4.0 3.8 3.8    CBC  Recent Labs Lab 06/11/15 0518 06/11/15 2000 06/12/15 0538  WBC 22.1* 18.4* 18.5*  HGB 7.1* 9.0* 8.5*  HCT 21.6* 27.7* 26.4*  PLT 104* 85* 82*    Coag's  Recent Labs Lab 06/08/15 0615 06/08/15 1023 06/08/15 1954  06/10/15 0305 06/11/15 0518 06/12/15 0745  APTT 35 47* 33  --   --   --   --   INR 2.80* 3.43* 2.77*  < > 2.23* 1.83* 1.79*  < > = values in this interval not displayed.  Sepsis Markers  Recent Labs Lab 06/09/15 0400 06/09/15 1128 06/09/15 1641 06/10/15 0305 06/11/15 0518  LATICACIDVEN 9.9* 6.4* 3.3*  --   --   PROCALCITON  --  20.49  --  14.32 12.01    ABG  Recent Labs Lab 06/09/15 1744 06/10/15 0811 06/12/15 0535  PHART 7.427 7.420 7.310*  PCO2ART 33.1* 39.0  43.7  PO2ART 91.0 124.0* 114*    Liver Enzymes  Recent Labs Lab 06/06/15 0210  06/09/15 1128  06/11/15 0518 06/11/15 1600 06/12/15 0538  AST 18  --  1199*  --   --   --   --   ALT 15*  --  527*  --   --   --   --   ALKPHOS 64  --  107  --   --   --   --   BILITOT 0.6  --  2.2*  --   --   --   --   ALBUMIN 1.9*  1.8*  < > 2.2*  < > 1.8* 1.8* 1.7*  < > = values in this interval not displayed.  Cardiac Enzymes  Recent Labs Lab 06/08/15 0808 06/08/15 1410 06/08/15 1852  TROPONINI 0.08* 0.32* 0.27*    Glucose  Recent Labs Lab 06/11/15 0752 06/11/15 1132 06/11/15 1616 06/11/15 1956 06/11/15 2349 06/12/15 0410  GLUCAP 156* 152* 144* 135* 146* 145*    Imaging Dg Chest Port 1 View  06/12/2015  CLINICAL DATA:  Acute respiratory failure. EXAM: PORTABLE CHEST 1 VIEW COMPARISON:   Chest radiograph 06/11/2015. FINDINGS: Right IJ central venous catheter tip projects in the superior vena cava. ETT terminates in the mid trachea. Bilateral chest tubes remain in place. Stable cardiomegaly. Low lung volumes. Stable retrocardiac opacities. No pneumothorax. Extensive chest wall emphysema. IMPRESSION: Stable support apparatus. Extensive chest wall emphysema. Atelectasis or infection left lung base. Electronically Signed   By: Lovey Newcomer M.D.   On: 06/12/2015 08:01     STUDIES:  CXR 04/25 > Low volumes. LLL opacity may reflect atelectasis or infiltrate. Renal US 04/25 > surgically absent left kidney, no hydronephrosis, small right renal cyst. CT A / P 05/02 > large right perinephric hematoma demonstrating increase in size since previous study.  CULTURES: Blood 04/25 > Klebsiella  Urine 04/25 > Multiple species present MRSA nares > negative Blood 04/27 > NGTD  ANTIBIOTICS: Ceftaz 04/25 >off Vanc 04/25 > 04/25 Ceftriaxone 04/29 >  SIGNIFICANT EVENTS: 04/25 > admitted with septic shock due to UTI. Significant ARF. 04/30 > transferred out of ICU. 05/03 > transferred back to ICU due to acute blood loss anemia presumed due to worsening right perinephric hematoma, hypotension. 5/3>> cardiac arrest  5/3>> IR embolization R perinephric hematoma  5/3- PTX left , bilateral chest tubes for massive sub q air - from cpr 5/7 -slow progress on pressors  LINES/TUBES: R IJ HD cath 04/26 > ETT 5/3>>>  L femoral CVL 5/3>>> L femoral sheath 5/3>>> L chest tube 5/3>>> R chest tube 5/3>>>  DISCUSSION: 75 y.o. M with hx RCC and prostate CA s/p radiation 2011 and left nephrectomy with increased INR, ARF, perinephric hematoma.  ASSESSMENT / PLAN:  CARDIOVASCULAR A:  Hemorrhagic Shock - right perinephric hematoma +/- sepsis r/t klebsiella UTI  Hx a.fib (on coumadin), HLD, HTN. Cardiogenic component? CI borderline on NONinvasive- improved Hypovolemia remains? vasoplegia? P:   Net even on CVVHD, allowing pccm to keep pos if needed given hypovolemia concerns intravascualr Levophed as needed to maintain goal MAP > 55 with good MS Vaso off cvp 4 and SV 12%, bolus further Need echo read from a few days ago?, will call cards - called , no good windows Repeat cortisol for burn out  PULMONARY A: Acute hypoxic respiratory failure. 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 Respiratory alkalosis  Extensive Sub-q emphysema - s/p  bilat chest tubes  From ptx likely left from cpr P:  MANDATORY SBT, attempt, cpap 5 ps 10, reduce ps as able CT to suction remain with air noted on films, but improved on films pcxr in am  abg reviewed, rate on vent to 16  HEMATOLOGIC / ONCOLOGIC A:  Acute blood loss anemia/hemorrhagic shock - worsening / increase in size of right perinephric hematoma - s/p IR embolization  Coagulopathy - s/p multiple units FFP and vitamin K septic shock Hx prostate CA s/p radiation 2011, RCC s/p left nephrectomy, hx DVT / PE (on coumadin) Leukocytosis Progressive thrombocytopenia  (no hep exposure) P:  F/u CBC in am  SCD's wolod favor sheeth removal, per IR, not yet when 1.6  RENAL A:  Acute renal failure - started on temp HD this admission. CKD III -- baseline Scr ~1.9 Chronic foley w/hematuria at admission - improving, likely 2/2 infx Perinephric hematoma - etiology could be benign cause, idiopathic or recurrence of malignancy given his RCC hx; increased in size 05/02. P:  Cont CVVHD  INFECTIOUS A:  Septic shock - due to klebsiella UTI/ bacteremia.  P:  Continue ceftriaxone, will need to extend duration, stop in place  GASTROINTESTINAL A:  Nutrition. P:  IR needed for tube, consult in  I will attempt my self to place tube  ppi  ENDOCRINE A:  DM. R/o burn out adrenal gland P:  Continue SSI Cortisol repeat  NEUROLOGIC A:  Pain Sedation needs on vent  Follows  commands - mental status encouraging s/p arrest/ongoing shock  P:  Fentanyl gtt , WUA Rass goal -1   FAMILY  - Updates:  Updated wife at length at bedside.  Daily Will need discussion dnr  - Inter-disciplinary family meet or Palliative Care meeting due by:  day 7  Ccm time 30 min   Lavon Paganini. Titus Mould, MD, [place] Pgr: [facility] 06/12/2015 8:25 AM

## 2015-06-13 ENCOUNTER — Inpatient Hospital Stay (HOSPITAL_COMMUNITY): Payer: BLUE CROSS/BLUE SHIELD

## 2015-06-13 DIAGNOSIS — R579 Shock, unspecified: Secondary | ICD-10-CM

## 2015-06-13 DIAGNOSIS — R079 Chest pain, unspecified: Secondary | ICD-10-CM

## 2015-06-13 LAB — CBC WITH DIFFERENTIAL/PLATELET
BASOS ABS: 0 10*3/uL (ref 0.0–0.1)
Basophils Relative: 0 %
Eosinophils Absolute: 0 10*3/uL (ref 0.0–0.7)
Eosinophils Relative: 0 %
HEMATOCRIT: 27.1 % — AB (ref 39.0–52.0)
Hemoglobin: 8.5 g/dL — ABNORMAL LOW (ref 13.0–17.0)
LYMPHS ABS: 0.5 10*3/uL — AB (ref 0.7–4.0)
LYMPHS PCT: 4 %
MCH: 28.2 pg (ref 26.0–34.0)
MCHC: 31.4 g/dL (ref 30.0–36.0)
MCV: 90 fL (ref 78.0–100.0)
MONOS PCT: 7 %
Monocytes Absolute: 0.9 10*3/uL (ref 0.1–1.0)
NEUTROS ABS: 11.1 10*3/uL — AB (ref 1.7–7.7)
Neutrophils Relative %: 89 %
Platelets: 79 10*3/uL — ABNORMAL LOW (ref 150–400)
RBC: 3.01 MIL/uL — ABNORMAL LOW (ref 4.22–5.81)
RDW: 17 % — AB (ref 11.5–15.5)
WBC: 12.5 10*3/uL — ABNORMAL HIGH (ref 4.0–10.5)

## 2015-06-13 LAB — PROTIME-INR
INR: 1.85 — ABNORMAL HIGH (ref 0.00–1.49)
INR: 1.79 — AB (ref 0.00–1.49)
PROTHROMBIN TIME: 21.3 s — AB (ref 11.6–15.2)
PROTHROMBIN TIME: 20.8 s — AB (ref 11.6–15.2)

## 2015-06-13 LAB — HEPATIC FUNCTION PANEL
ALT: 178 U/L — ABNORMAL HIGH (ref 17–63)
AST: 105 U/L — AB (ref 15–41)
Albumin: 1.8 g/dL — ABNORMAL LOW (ref 3.5–5.0)
Alkaline Phosphatase: 129 U/L — ABNORMAL HIGH (ref 38–126)
BILIRUBIN DIRECT: 0.9 mg/dL — AB (ref 0.1–0.5)
BILIRUBIN INDIRECT: 1.1 mg/dL — AB (ref 0.3–0.9)
TOTAL PROTEIN: 4.6 g/dL — AB (ref 6.5–8.1)
Total Bilirubin: 2 mg/dL — ABNORMAL HIGH (ref 0.3–1.2)

## 2015-06-13 LAB — GLUCOSE, CAPILLARY
GLUCOSE-CAPILLARY: 128 mg/dL — AB (ref 65–99)
GLUCOSE-CAPILLARY: 132 mg/dL — AB (ref 65–99)
Glucose-Capillary: 121 mg/dL — ABNORMAL HIGH (ref 65–99)
Glucose-Capillary: 110 mg/dL — ABNORMAL HIGH (ref 65–99)
Glucose-Capillary: 120 mg/dL — ABNORMAL HIGH (ref 65–99)
Glucose-Capillary: 85 mg/dL (ref 65–99)

## 2015-06-13 LAB — RENAL FUNCTION PANEL
Albumin: 1.8 g/dL — ABNORMAL LOW (ref 3.5–5.0)
Albumin: 2.5 g/dL — ABNORMAL LOW (ref 3.5–5.0)
Anion gap: 11 (ref 5–15)
Anion gap: 13 (ref 5–15)
BUN: 18 mg/dL (ref 6–20)
BUN: 17 mg/dL (ref 6–20)
CHLORIDE: 104 mmol/L (ref 101–111)
CHLORIDE: 104 mmol/L (ref 101–111)
CO2: 21 mmol/L — AB (ref 22–32)
CO2: 21 mmol/L — ABNORMAL LOW (ref 22–32)
Calcium: 7.8 mg/dL — ABNORMAL LOW (ref 8.9–10.3)
Calcium: 8.1 mg/dL — ABNORMAL LOW (ref 8.9–10.3)
Creatinine, Ser: 1.81 mg/dL — ABNORMAL HIGH (ref 0.61–1.24)
Creatinine, Ser: 1.82 mg/dL — ABNORMAL HIGH (ref 0.61–1.24)
GFR calc Af Amer: 41 mL/min — ABNORMAL LOW (ref >60)
GFR calc Af Amer: 41 mL/min — ABNORMAL LOW (ref >60)
GFR calc non Af Amer: 35 mL/min — ABNORMAL LOW (ref >60)
GFR, EST NON AFRICAN AMERICAN: 35 mL/min — AB (ref >60)
GLUCOSE: 139 mg/dL — AB (ref 65–99)
Glucose, Bld: 141 mg/dL — ABNORMAL HIGH (ref 65–99)
POTASSIUM: 4.5 mmol/L (ref 3.5–5.1)
POTASSIUM: 4.3 mmol/L (ref 3.5–5.1)
Phosphorus: 2.8 mg/dL (ref 2.5–4.6)
Phosphorus: 2.7 mg/dL (ref 2.5–4.6)
Sodium: 136 mmol/L (ref 135–145)
Sodium: 138 mmol/L (ref 135–145)

## 2015-06-13 LAB — ECHOCARDIOGRAM COMPLETE
Height: 69 in
WEIGHTICAEL: 5005.32 [oz_av]

## 2015-06-13 LAB — MAGNESIUM: Magnesium: 2.4 mg/dL (ref 1.7–2.4)

## 2015-06-13 MED ORDER — DIATRIZOATE MEGLUMINE & SODIUM 66-10 % PO SOLN
90.0000 mL | Freq: Once | ORAL | Status: AC
  Administered 2015-06-13: 30 mL
  Filled 2015-06-13: qty 90

## 2015-06-13 MED ORDER — ALBUMIN HUMAN 25 % IV SOLN
25.0000 g | Freq: Four times a day (QID) | INTRAVENOUS | Status: AC
  Administered 2015-06-13 – 2015-06-15 (×8): 25 g via INTRAVENOUS
  Filled 2015-06-13 (×4): qty 100
  Filled 2015-06-13 (×2): qty 50
  Filled 2015-06-13 (×4): qty 100

## 2015-06-13 MED ORDER — WHITE PETROLATUM GEL
Status: AC
  Administered 2015-06-13: 17:00:00
  Filled 2015-06-13: qty 1

## 2015-06-13 MED ORDER — SODIUM CHLORIDE 0.9 % IV SOLN: Freq: Once | INTRAVENOUS | Status: DC

## 2015-06-13 MED ORDER — LIDOCAINE VISCOUS 2 % MT SOLN
OROMUCOSAL | Status: AC
  Administered 2015-06-13: 15 mL via NASAL
  Filled 2015-06-13: qty 15

## 2015-06-13 MED ORDER — DIATRIZOATE MEGLUMINE & SODIUM 66-10 % PO SOLN
ORAL | Status: AC
  Filled 2015-06-13: qty 30

## 2015-06-13 NOTE — Progress Notes (Signed)
PULMONARY / CRITICAL CARE MEDICINE   Name: [name] MRN: [id number] DOB: 01-02-1941    ADMISSION DATE:  05/31/2015 CONSULTATION DATE:  4/25  REFERRING MD:  EDP  CHIEF COMPLAINT:  Hypotension   HISTORY OF PRESENT ILLNESS:   74yo male with hx PE, OSA, CKD III (L nephrectomy r/t RCC), DM initially admitted 4/25 with sepsis r/t UTI.  Admitted by PCCM, requiring vasopressors.  He improved slowly and was tx out to med-surg on 4/30.  On 5/2 developed R flank pain and CT abd/pelvis revealed increased R perinephric hematoma worsened by heparin gtt and pt developed worsening hypotension requiring tx back to ICU and vasopressors.  Pt continued to deteriorate overnight and ultimately had cardiac arrest am 5/3 with approx 12 mins CPR.  Multiple blood products given.  Pt taken to IR for embolization of R perinephric hematoma once stabilized.  Course now c/b AKI on CKD requiring CVVHD, bilat chest tubes r/t significant sub-q emphysema post CPR.    SUBJECTIVE:  afebrile Sub q air remains Levophed down to 14  VITAL SIGNS: BP 93/47 mmHg  Pulse 92  Temp(Src) 96.2 F (35.7 C) (Oral)  Resp 20  Ht 5\' 9"  (1.753 m)  Wt 312 lb 13.3 oz (141.9 kg)  BMI 46.18 kg/m2  SpO2 100%  HEMODYNAMICS: CVP:  [1 mmHg-8 mmHg] 4 mmHg  VENTILATOR SETTINGS: Vent Mode:  [-] PRVC FiO2 (%):  [40 %] 40 % Set Rate:  [16 bmp] 16 bmp Vt Set:  [470 mL-570 mL] 570 mL PEEP:  [5 cmH20] 5 cmH20 Pressure Support:  [10 cmH20] 10 cmH20 Plateau Pressure:  [16 cmH20-20 cmH20] 17 cmH20  INTAKE / OUTPUT: I/O last 3 completed shifts: In: 3200.2 [I.V.:3150.2; IV Piggyback:50] Out: 1640 [Urine:15; Other:1585; Chest Tube:40]  PHYSICAL EXAMINATION: General:  Chronically ill appearing male, acutely ill  Neuro:  Sedated, RASS -2, int FC with agitattion HEENT:  Mm moist, no JVD, ETT  Cardiovascular:  s1s2 rr not tachy Lungs:  Coarse, sub q crepitus on chest, NO leaks Abdomen: distended, hypoactive bs, no r/g Musculoskeletal:   Warm and dry, 2+ BLE edema   LABS:  BMET  Recent Labs Lab 06/12/15 0538 06/12/15 1557 06/13/15 0418  NA 137 138 136  K 4.9 4.7 4.5  CL 104 103 104  CO2 20* 21* 21*  BUN 22* 19 18  CREATININE 2.10* 1.87* 1.81*  GLUCOSE 159* 156* 139*    Electrolytes  Recent Labs Lab 06/11/15 2340 06/12/15 0538 06/12/15 1557 06/13/15 0417 06/13/15 0418  CALCIUM  --  7.8* 7.8*  --  7.8*  MG 2.3 2.2  --  2.4  --   PHOS 3.8 3.8 3.5  --  2.8    CBC  Recent Labs Lab 06/11/15 2000 06/12/15 0538 06/13/15 0417  WBC 18.4* 18.5* 12.5*  HGB 9.0* 8.5* 8.5*  HCT 27.7* 26.4* 27.1*  PLT 85* 82* 79*    Coag's  Recent Labs Lab 06/08/15 0615 06/08/15 1023 06/08/15 1954  06/11/15 0518 06/12/15 0745 06/13/15 0417  APTT 35 47* 33  --   --   --   --   INR 2.80* 3.43* 2.77*  < > 1.83* 1.79* 1.85*  < > = values in this interval not displayed.  Sepsis Markers  Recent Labs Lab 06/09/15 0400 06/09/15 1128 06/09/15 1641 06/10/15 0305 06/11/15 0518  LATICACIDVEN 9.9* 6.4* 3.3*  --   --   PROCALCITON  --  20.49  --  14.32 12.01    ABG  Recent Labs Lab  06/09/15 1744 06/10/15 0811 06/12/15 0535  PHART 7.427 7.420 7.310*  PCO2ART 33.1* 39.0 43.7  PO2ART 91.0 124.0* 114*    Liver Enzymes  Recent Labs Lab 06/09/15 1128  06/12/15 0538 06/12/15 1557 06/13/15 0418  AST 1199*  --   --   --   --   ALT 527*  --   --   --   --   ALKPHOS 107  --   --   --   --   BILITOT 2.2*  --   --   --   --   ALBUMIN 2.2*  < > 1.7* 1.7* 1.8*  < > = values in this interval not displayed.  Cardiac Enzymes  Recent Labs Lab 06/08/15 0808 06/08/15 1410 06/08/15 1852  TROPONINI 0.08* 0.32* 0.27*    Glucose  Recent Labs Lab 06/12/15 1224 06/12/15 1540 06/12/15 1958 06/12/15 2356 06/13/15 0435 06/13/15 0733  GLUCAP 119* 139* 127* 132* 121* 110*    Imaging Dg Chest Port 1 View  06/13/2015  CLINICAL DATA:  Pneumothorax EXAM: PORTABLE CHEST 1 VIEW COMPARISON:  06/12/2015  FINDINGS: Bilateral chest tubes remain in place.  No pneumothorax. Endotracheal tube in good position. Right jugular central venous catheter tip in the right innominate vein unchanged. Bibasilar atelectasis left greater than right is unchanged. Bilateral subcutaneous emphysema appears improved. IMPRESSION: Endotracheal tube in good position.  No pneumothorax Bibasilar atelectasis unchanged. Electronically Signed   By: Franchot Gallo M.D.   On: 06/13/2015 07:33   Dg Abd Portable 1v  06/12/2015  CLINICAL DATA:  Evaluate NG tube. EXAM: PORTABLE ABDOMEN - 1 VIEW COMPARISON:  Film from earlier today FINDINGS: The NG tube takes an unusual course. However, based on previous CT imaging, I suspect the tube probably terminates in the hiatal hernia with the distal tip flipped back on itself. Air in the subcutaneous tissues of the left chest. The ET tube remains in place. IMPRESSION: The distal tip of the NG tube appears to be within the hiatal hernia with the distal tip flipped back on itself. Findings called to the patient's nurse. Electronically Signed   By: Dorise Bullion III M.D   On: 06/12/2015 13:40   Dg Abd Portable 1v  06/12/2015  CLINICAL DATA:  Evaluate NG tube placement. EXAM: PORTABLE ABDOMEN - 1 VIEW COMPARISON:  Jun 12, 2015 FINDINGS: The NG tube still terminates in the region of left mainstem bronchus. No other interval changes. Air is seen in the subcutaneous tissues of the chest, right greater than left. IMPRESSION: The NG tube appears to terminate in the left mainstem bronchus. This finding was called to the patient's nurse. Per report, the tube has already been removed. Electronically Signed   By: Dorise Bullion III M.D   On: 06/12/2015 13:20   Dg Abd Portable 1v  06/12/2015  CLINICAL DATA:  Evaluate for feeding tube placement. EXAM: PORTABLE ABDOMEN - 1 VIEW COMPARISON:  Abdominal radiograph 06/09/2015 FINDINGS: The distal aspect of the enteric tube projects over the mid esophagus. Surgical clips  within the mid abdomen. Nonobstructed bowel gas pattern. IMPRESSION: The enteric tube is located within the midesophagus. This needs to be advanced. These results will be called to the ordering clinician or representative by the Radiologist Assistant, and communication documented in the PACS or zVision Dashboard. Electronically Signed   By: Lovey Newcomer M.D.   On: 06/12/2015 11:16     STUDIES:  CXR 04/25 > Low volumes. LLL opacity may reflect atelectasis or infiltrate. Renal US 04/25 >  surgically absent left kidney, no hydronephrosis, small right renal cyst. CT A / P 05/02 > large right perinephric hematoma demonstrating increase in size since previous study.  CULTURES: Blood 04/25 > Klebsiella  Urine 04/25 > Multiple species present MRSA nares > negative Blood 04/27 > NGTD  ANTIBIOTICS: Ceftaz 04/25 >off Vanc 04/25 > 04/25 Ceftriaxone 04/29 >  SIGNIFICANT EVENTS: 04/25 > admitted with septic shock due to UTI. Significant ARF. 04/30 > transferred out of ICU. 05/03 > transferred back to ICU due to acute blood loss anemia presumed due to worsening right perinephric hematoma, hypotension. 5/3>> cardiac arrest  5/3>> IR embolization R perinephric hematoma  5/3- PTX left , bilateral chest tubes for massive sub q air - from cpr 5/7 -slow progress on pressors  LINES/TUBES: R IJ HD cath 04/26 > ETT 5/3>>>  L femoral CVL 5/3>>> L femoral sheath 5/3>>> L chest tube 5/3>>> R chest tube 5/3>>>  DISCUSSION: 75 y.o. M with hx RCC and prostate CA s/p radiation 2011 and left nephrectomy with increased INR, ARF, perinephric hematoma.  ASSESSMENT / PLAN:  CARDIOVASCULAR A:  Hemorrhagic Shock - right perinephric hematoma +/- sepsis r/t klebsiella UTI  Hx a.fib (on coumadin), HLD, HTN. Cardiogenic component? CI borderline on NONinvasive- improved Hypovolemia remains? Vasoplegia? Cortisol 26 P:  Allow to run positive  Levophed as needed to maintain goal MAP > 55 with good MS Vaso  off Await echo   PULMONARY A: Acute hypoxic respiratory failure. 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 Respiratory alkalosis  Extensive Sub-q emphysema - s/p bilat chest tubes  From ptx likely left from cpr P:   SBT, but no extubation CT to suction remain  pcxr daily  HEMATOLOGIC / ONCOLOGIC A:  Acute blood loss anemia/hemorrhagic shock -  right perinephric hematoma - s/p IR embolization  Coagulopathy - s/p multiple units FFP and vitamin K septic shock Hx prostate CA s/p radiation 2011, RCC s/p left nephrectomy, hx DVT / PE (on coumadin) Leukocytosis Progressive thrombocytopenia  (no hep exposure) P:  F/u CBC in am  SCD's  sheath removal, per IR,when INR 1.6 - 2 u FFP today  RENAL A:  AKI - started on temp HD this admission. CKD III -- baseline Scr ~1.9 Chronic foley w/hematuria at admission - improving, likely 2/2 infx Perinephric hematoma - etiology could be benign cause, idiopathic or recurrence of malignancy given his RCC hx; increased in size 05/02. P:  Cont CVVHD  INFECTIOUS A:  Septic shock - due to klebsiella UTI/ bacteremia.  P:  Continue ceftriaxone, stop in place 14 ds  GASTROINTESTINAL A:  Nutrition. P:  IR needed for tube,with fluoro ppi  ENDOCRINE A:  DM. R/o burn out adrenal gland P:  Continue SSI Cortisol repeat  NEUROLOGIC A:  Pain Follows commands - mental status encouraging s/p arrest/ongoing shock  P:  Fentanyl gtt , WUA Rass goal -1to -2   FAMILY  - Updates:  Updated wife at length at bedside.  Daily  - Inter-disciplinary family meet or Palliative Care meeting due by: done 5/8  The patient is critically ill with multiple organ systems failure and requires high complexity decision making for assessment and support, frequent evaluation and titration of therapies, application of advanced monitoring technologies and extensive interpretation of multiple databases.  Critical Care Time devoted to patient care services described in this note independent of APP time is 35 minutes.    Kara Mead MD. Shade Flood. [facility]  Pager [phone] If no response call [phone]   06/13/2015    06/13/2015 9:18 AM

## 2015-06-13 NOTE — Progress Notes (Signed)
[facility] Rounding Note  Subjective: Sedated. Follows some commands.   Objective: Vital signs in last 24 hours: Temp:  [94.5 F (34.7 C)-97.5 F (36.4 C)] 96.2 F (35.7 C) (05/08 0735) Pulse Rate:  [68-117] 92 (05/08 0803) Resp:  [15-44] 20 (05/08 0700) BP: (70-134)/(35-118) 93/47 mmHg (05/08 0803) SpO2:  [89 %-100 %] 100 % (05/08 0803) Arterial Line BP: (73-123)/(36-70) 92/51 mmHg (05/08 0700) FiO2 (%):  [40 %] 40 % (05/08 0803) Weight:  [312 lb 13.3 oz (141.9 kg)] 312 lb 13.3 oz (141.9 kg) (05/08 0500) Weight change:   Intake/Output from previous day: 05/07 0701 - 05/08 0700 In: 2600.6 [I.V.:2550.6; IV Piggyback:50] Out: 1095 [Urine:5] Intake/Output this shift:   BP 128/108 mmHg  Pulse 94  Temp(Src) 96.8 F (36 C) (Axillary)  Resp 16  Ht '5\' 9"'  (1.753 m)  Wt 141.9 kg (312 lb 13.3 oz)  BMI 46.18 kg/m2  SpO2 100%  General appearance: Sedated and intubated. Massive anasarca, extensive SQ air chest wall ant Resp: intubated Coarse breath sounds bilaterally, chest tubes in places bilaterally, subq emphysema on chest wall noted.  Cardio: RRR GI: S, NT, ND Ext: 4+ pitting edema in LE Neuro: Sedated, Follows some commands.   Lab Results:  Recent Labs  06/12/15 0538 06/13/15 0417  WBC 18.5* 12.5*  HGB 8.5* 8.5*  HCT 26.4* 27.1*  PLT 82* 79*     Recent Labs  06/12/15 1557 06/13/15 0418  NA 138 136  K 4.7 4.5  CL 103 104  CO2 21* 21*  GLUCOSE 156* 139*  BUN 19 18  CREATININE 1.87* 1.81*  CALCIUM 7.8* 7.8*   No results for input(s): PTH in the last 72 hours. Iron Studies: No results for input(s): IRON, TIBC, TRANSFERRIN, FERRITIN in the last 72 hours. Studies/Results: Dg Chest Port 1 View  06/13/2015  CLINICAL DATA:  Pneumothorax EXAM: PORTABLE CHEST 1 VIEW COMPARISON:  06/12/2015 FINDINGS: Bilateral chest tubes remain in place.  No pneumothorax. Endotracheal tube in good position. Right jugular central venous catheter tip in the right innominate vein unchanged.  Bibasilar atelectasis left greater than right is unchanged. Bilateral subcutaneous emphysema appears improved. IMPRESSION: Endotracheal tube in good position.  No pneumothorax Bibasilar atelectasis unchanged. Electronically Signed   By: Franchot Gallo M.D.   On: 06/13/2015 07:33   Dg Chest Port 1 View  06/12/2015  CLINICAL DATA:  Acute respiratory failure. EXAM: PORTABLE CHEST 1 VIEW COMPARISON:  Chest radiograph 06/11/2015. FINDINGS: Right IJ central venous catheter tip projects in the superior vena cava. ETT terminates in the mid trachea. Bilateral chest tubes remain in place. Stable cardiomegaly. Low lung volumes. Stable retrocardiac opacities. No pneumothorax. Extensive chest wall emphysema. IMPRESSION: Stable support apparatus. Extensive chest wall emphysema. Atelectasis or infection left lung base. Electronically Signed   By: Lovey Newcomer M.D.   On: 06/12/2015 08:01   Dg Abd Portable 1v  06/12/2015  CLINICAL DATA:  Evaluate NG tube. EXAM: PORTABLE ABDOMEN - 1 VIEW COMPARISON:  Film from earlier today FINDINGS: The NG tube takes an unusual course. However, based on previous CT imaging, I suspect the tube probably terminates in the hiatal hernia with the distal tip flipped back on itself. Air in the subcutaneous tissues of the left chest. The ET tube remains in place. IMPRESSION: The distal tip of the NG tube appears to be within the hiatal hernia with the distal tip flipped back on itself. Findings called to the patient's nurse. Electronically Signed   By: Dorise Bullion III M.D  On: 06/12/2015 13:40   Dg Abd Portable 1v  06/12/2015  CLINICAL DATA:  Evaluate NG tube placement. EXAM: PORTABLE ABDOMEN - 1 VIEW COMPARISON:  Jun 12, 2015 FINDINGS: The NG tube still terminates in the region of left mainstem bronchus. No other interval changes. Air is seen in the subcutaneous tissues of the chest, right greater than left. IMPRESSION: The NG tube appears to terminate in the left mainstem bronchus. This finding  was called to the patient's nurse. Per report, the tube has already been removed. Electronically Signed   By: Dorise Bullion III M.D   On: 06/12/2015 13:20   Dg Abd Portable 1v  06/12/2015  CLINICAL DATA:  Evaluate for feeding tube placement. EXAM: PORTABLE ABDOMEN - 1 VIEW COMPARISON:  Abdominal radiograph 06/09/2015 FINDINGS: The distal aspect of the enteric tube projects over the mid esophagus. Surgical clips within the mid abdomen. Nonobstructed bowel gas pattern. IMPRESSION: The enteric tube is located within the midesophagus. This needs to be advanced. These results will be called to the ordering clinician or representative by the Radiologist Assistant, and communication documented in the PACS or zVision Dashboard. Electronically Signed   By: Lovey Newcomer M.D.   On: 06/12/2015 11:16    Scheduled Medications: . sodium chloride   Intravenous Once  . antiseptic oral rinse  7 mL Mouth Rinse QID  . cefTRIAXone (ROCEPHIN)  IV  2 g Intravenous Q24H  . chlorhexidine gluconate (SAGE KIT)  15 mL Mouth Rinse BID  . sennosides  5 mL Oral BID   And  . docusate  100 mg Oral BID  . feeding supplement (PRO-STAT SUGAR FREE 64)  30 mL Per Tube TID  . feeding supplement (VITAL HIGH PROTEIN)  1,000 mL Per Tube Q24H  . fentaNYL (SUBLIMAZE) injection  50 mcg Intravenous Once  . insulin aspart  0-15 Units Subcutaneous Q4H  . pantoprazole (PROTONIX) IV  40 mg Intravenous Q24H  . sodium chloride flush  10-40 mL Intracatheter Q12H   . sodium chloride 10 mL/hr at 06/13/15 0700  . fentaNYL infusion INTRAVENOUS 200 mcg/hr (06/13/15 1427)  . norepinephrine (LEVOPHED) Adult infusion 16 mcg/min (06/13/15 0700)  . dialysis replacement fluid (prismasate) 300 mL/hr at 06/12/15 1724  . dialysis replacement fluid (prismasate) 400 mL/hr at 06/13/15 0625  . dialysate (PRISMASATE) 2,000 mL/hr at 06/13/15 1659  . vasopressin (PITRESSIN) infusion - *FOR SHOCK* Stopped (06/12/15 0700)    Assessment/ Plan: Pt is a 75 y.o.  yo male who was admitted on 05/31/2015 with septic shock 2/2 klebsiella bacteremia. Readmitted to the ICU 06/08/2015 with hemorrhagic shock in the setting of a right perinephric hematoma. Now s/p coil embolization .  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 Patient's Cr was notably elevated at 9.33, his baseline is thought to be around 1.7-1.9. Now has bilat chest tubes r/t significant sub-q emphysema post CPR.  1. AKI/CKD, patient s/p left nephrectomy and now s/p partial right renal artery coil embolization 2/2 perinephric hematoma. Anuric.  - On CVVHD given tenuous hemodynamic status  - Will give colloid today.  2. Hemorrhagic shock 2/2 Acute blood loss anemia 2/2 right perinephric hematoma. Also with coagulopathy, s/p FFP and vit K  - s/p coil embolization by IR  - On pressors per CCM  - Trend CBC, transfuse as needed  - Trend INR  - Will be getting FFP today.  3. Urosepsis / Positive blood culture pan-sensitive Kleb  Pneumo. abx per primary team.   Dimas Chyle 06/13/2015,8:28 AM   I have seen and examined this patient and agree with plan and assessment in the above note. Anuric AKI. Not tolerating volume removal with CRRT (anasarca but CVP's low side - extensive 3rd spacing of fluids). Will give albumin Q6H for oncotic pressure, if CVP comes up a bit then hope can UF some volume. K looks fine, phos trending down but no replacement needed yet.  Katesha Eichel B,MD 06/13/2015 6:32 PM

## 2015-06-13 NOTE — Progress Notes (Signed)
Echocardiogram 2D Echocardiogram has been performed.  Tresa Res 06/13/2015, 11:30 AM

## 2015-06-14 ENCOUNTER — Telehealth: Payer: Self-pay | Admitting: *Deleted

## 2015-06-14 ENCOUNTER — Inpatient Hospital Stay (HOSPITAL_COMMUNITY): Payer: BLUE CROSS/BLUE SHIELD

## 2015-06-14 LAB — RENAL FUNCTION PANEL
ANION GAP: 9 (ref 5–15)
Albumin: 2.8 g/dL — ABNORMAL LOW (ref 3.5–5.0)
Albumin: 2.7 g/dL — ABNORMAL LOW (ref 3.5–5.0)
Anion gap: 12 (ref 5–15)
BUN: 17 mg/dL (ref 6–20)
BUN: 16 mg/dL (ref 6–20)
CALCIUM: 8.2 mg/dL — AB (ref 8.9–10.3)
CHLORIDE: 102 mmol/L (ref 101–111)
CHLORIDE: 106 mmol/L (ref 101–111)
CO2: 24 mmol/L (ref 22–32)
CO2: 24 mmol/L (ref 22–32)
CREATININE: 1.74 mg/dL — AB (ref 0.61–1.24)
Calcium: 8.1 mg/dL — ABNORMAL LOW (ref 8.9–10.3)
Creatinine, Ser: 1.59 mg/dL — ABNORMAL HIGH (ref 0.61–1.24)
GFR calc non Af Amer: 41 mL/min — ABNORMAL LOW (ref >60)
GFR, EST AFRICAN AMERICAN: 43 mL/min — AB (ref >60)
GFR, EST AFRICAN AMERICAN: 48 mL/min — AB (ref >60)
GFR, EST NON AFRICAN AMERICAN: 37 mL/min — AB (ref >60)
GLUCOSE: 110 mg/dL — AB (ref 65–99)
Glucose, Bld: 127 mg/dL — ABNORMAL HIGH (ref 65–99)
Phosphorus: 2.5 mg/dL (ref 2.5–4.6)
Phosphorus: 2.2 mg/dL — ABNORMAL LOW (ref 2.5–4.6)
Potassium: 4.3 mmol/L (ref 3.5–5.1)
Potassium: 4.3 mmol/L (ref 3.5–5.1)
SODIUM: 138 mmol/L (ref 135–145)
Sodium: 139 mmol/L (ref 135–145)

## 2015-06-14 LAB — CBC WITH DIFFERENTIAL/PLATELET
Basophils Absolute: 0 10*3/uL (ref 0.0–0.1)
Basophils Relative: 0 %
Eosinophils Absolute: 0 10*3/uL (ref 0.0–0.7)
Eosinophils Relative: 1 %
HEMATOCRIT: 23.9 % — AB (ref 39.0–52.0)
HEMOGLOBIN: 7.5 g/dL — AB (ref 13.0–17.0)
LYMPHS ABS: 0.2 10*3/uL — AB (ref 0.7–4.0)
LYMPHS PCT: 5 %
MCH: 28.7 pg (ref 26.0–34.0)
MCHC: 31.4 g/dL (ref 30.0–36.0)
MCV: 91.6 fL (ref 78.0–100.0)
MONOS PCT: 10 %
Monocytes Absolute: 0.4 10*3/uL (ref 0.1–1.0)
NEUTROS ABS: 2.9 10*3/uL (ref 1.7–7.7)
NEUTROS PCT: 83 %
Platelets: 41 10*3/uL — ABNORMAL LOW (ref 150–400)
RBC: 2.61 MIL/uL — AB (ref 4.22–5.81)
RDW: 17.6 % — ABNORMAL HIGH (ref 11.5–15.5)
WBC: 3.5 10*3/uL — AB (ref 4.0–10.5)

## 2015-06-14 LAB — PREPARE FRESH FROZEN PLASMA
UNIT DIVISION: 0
Unit division: 0

## 2015-06-14 LAB — CBC
HEMATOCRIT: 23.5 % — AB (ref 39.0–52.0)
Hemoglobin: 7.3 g/dL — ABNORMAL LOW (ref 13.0–17.0)
MCH: 28.6 pg (ref 26.0–34.0)
MCHC: 31.1 g/dL (ref 30.0–36.0)
MCV: 92.2 fL (ref 78.0–100.0)
Platelets: 52 10*3/uL — ABNORMAL LOW (ref 150–400)
RBC: 2.55 MIL/uL — ABNORMAL LOW (ref 4.22–5.81)
RDW: 17.4 % — AB (ref 11.5–15.5)
WBC: 5.7 10*3/uL (ref 4.0–10.5)

## 2015-06-14 LAB — GLUCOSE, CAPILLARY
GLUCOSE-CAPILLARY: 113 mg/dL — AB (ref 65–99)
GLUCOSE-CAPILLARY: 115 mg/dL — AB (ref 65–99)
GLUCOSE-CAPILLARY: 124 mg/dL — AB (ref 65–99)
Glucose-Capillary: 101 mg/dL — ABNORMAL HIGH (ref 65–99)
Glucose-Capillary: 110 mg/dL — ABNORMAL HIGH (ref 65–99)
Glucose-Capillary: 116 mg/dL — ABNORMAL HIGH (ref 65–99)

## 2015-06-14 LAB — PROTIME-INR
INR: 2.03 — ABNORMAL HIGH (ref 0.00–1.49)
INR: 1.75 — ABNORMAL HIGH (ref 0.00–1.49)
Prothrombin Time: 22.8 seconds — ABNORMAL HIGH (ref 11.6–15.2)
Prothrombin Time: 20.4 seconds — ABNORMAL HIGH (ref 11.6–15.2)

## 2015-06-14 LAB — PREPARE RBC (CROSSMATCH)

## 2015-06-14 LAB — MAGNESIUM: MAGNESIUM: 2.4 mg/dL (ref 1.7–2.4)

## 2015-06-14 MED ORDER — DEXTROSE 5 % IV SOLN
5.0000 mg | Freq: Once | INTRAVENOUS | Status: AC
  Administered 2015-06-14: 5 mg via INTRAVENOUS
  Filled 2015-06-14: qty 0.5

## 2015-06-14 MED ORDER — SODIUM CHLORIDE 0.9 % IV SOLN
Freq: Once | INTRAVENOUS | Status: AC
  Administered 2015-06-15: 01:00:00 via INTRAVENOUS

## 2015-06-14 MED ORDER — SODIUM CHLORIDE 0.9 % IV SOLN: Freq: Once | INTRAVENOUS | Status: DC

## 2015-06-14 MED ORDER — VITAL HIGH PROTEIN PO LIQD
1000.0000 mL | ORAL | Status: DC
  Administered 2015-06-14: 1000 mL

## 2015-06-14 NOTE — Progress Notes (Signed)
[place] Progress Note Patient Name: [name] DOB: 24-Jul-1940 MRN: [id number]   Date of Service  06/14/2015  HPI/Events of Note  INR = 1.75. Team requests correction to < 1.6 for sheath removal.   eICU Interventions  Will transfuse 2 units FFP when available. Repeat INR in AM.     Intervention Category Intermediate Interventions: Coagulopathy - evaluation and management  Teneshia Hedeen Eugene 06/14/2015, 9:48 PM

## 2015-06-14 NOTE — Progress Notes (Signed)
PULMONARY / CRITICAL CARE MEDICINE   Name: [name] MRN: [id number] DOB: 1940-05-21    ADMISSION DATE:  05/31/2015 CONSULTATION DATE:  4/25  REFERRING MD:  EDP  CHIEF COMPLAINT:  Hypotension   HISTORY OF PRESENT ILLNESS:   75yo male with hx PE, OSA, CKD III (L nephrectomy r/t RCC), DM initially admitted 4/25 with sepsis r/t UTI.  Admitted by PCCM, requiring vasopressors.  He improved slowly and was tx out to med-surg on 4/30.  On 5/2 developed R flank pain and CT abd/pelvis revealed increased R perinephric hematoma worsened by heparin gtt and pt developed worsening hypotension requiring tx back to ICU and vasopressors.  Pt continued to deteriorate overnight and ultimately had cardiac arrest am 5/3 with approx 12 mins CPR.  Multiple blood products given.  Pt taken to IR for embolization of R perinephric hematoma once stabilized.  Course now c/b AKI on CKD requiring CVVHD, bilat chest tubes r/t significant sub-q emphysema post CPR.    SUBJECTIVE:  afebrile Levophed down to 7 Sub q air remains Agitated on WUA BM+  VITAL SIGNS: BP 102/73 mmHg  Pulse 92  Temp(Src) 97.7 F (36.5 C) (Axillary)  Resp 20  Ht 5\' 9"  (1.753 m)  Wt 316 lb 12.8 oz (143.7 kg)  BMI 46.76 kg/m2  SpO2 100%  HEMODYNAMICS: CVP:  [4 mmHg-10 mmHg] 5 mmHg  VENTILATOR SETTINGS: Vent Mode:  [-] PRVC FiO2 (%):  [40 %] 40 % Set Rate:  [16 bmp] 16 bmp Vt Set:  [570 mL] 570 mL PEEP:  [5 cmH20] 5 cmH20 Plateau Pressure:  [17 cmH20-20 cmH20] 20 cmH20  INTAKE / OUTPUT: I/O last 3 completed shifts: In: 2763.9 [I.V.:1676.9; Blood:637; IV Piggyback:450] Out: 2596 [Urine:30; QZ:[id number]; Chest Tube:30]  PHYSICAL EXAMINATION: General:  Chronically ill appearing male, acutely ill  Neuro:  Sedated, RASS -1, int FC with agitattion HEENT:  Mm moist, no JVD, ETT  Cardiovascular:  s1s2 rr not tachy Lungs:  Coarse, sub q crepitus on chest, NO leaks Abdomen: distended, hypoactive bs, no r/g Musculoskeletal:  Warm and  dry, 2+ BLE edema , ana sarca  LABS:  BMET  Recent Labs Lab 06/13/15 0418 06/13/15 1828 06/14/15 0417  NA 136 138 138  K 4.5 4.3 4.3  CL 104 104 102  CO2 21* 21* 24  BUN 18 17 17   CREATININE 1.81* 1.82* 1.74*  GLUCOSE 139* 141* 127*    Electrolytes  Recent Labs Lab 06/12/15 0538  06/13/15 0417 06/13/15 0418 06/13/15 1828 06/14/15 0417  CALCIUM 7.8*  < >  --  7.8* 8.1* 8.2*  MG 2.2  --  2.4  --   --  2.4  PHOS 3.8  < >  --  2.8 2.7 2.5  < > = values in this interval not displayed.  CBC  Recent Labs Lab 06/12/15 0538 06/13/15 0417 06/14/15 0417  WBC 18.5* 12.5* 5.7  HGB 8.5* 8.5* 7.3*  HCT 26.4* 27.1* 23.5*  PLT 82* 79* 52*    Coag's  Recent Labs Lab 06/08/15 0615 06/08/15 1023 06/08/15 1954  06/13/15 0417 06/13/15 1315 06/14/15 0417  APTT 35 47* 33  --   --   --   --   INR 2.80* 3.43* 2.77*  < > 1.85* 1.79* 2.03*  < > = values in this interval not displayed.  Sepsis Markers  Recent Labs Lab 06/09/15 0400 06/09/15 1128 06/09/15 1641 06/10/15 0305 06/11/15 0518  LATICACIDVEN 9.9* 6.4* 3.3*  --   --   PROCALCITON  --  20.49  --  14.32 12.01    ABG  Recent Labs Lab 06/09/15 1744 06/10/15 0811 06/12/15 0535  PHART 7.427 7.420 7.310*  PCO2ART 33.1* 39.0 43.7  PO2ART 91.0 124.0* 114*    Liver Enzymes  Recent Labs Lab 06/09/15 1128  06/13/15 0418 06/13/15 1828 06/14/15 0417  AST 1199*  --  105*  --   --   ALT 527*  --  178*  --   --   ALKPHOS 107  --  129*  --   --   BILITOT 2.2*  --  2.0*  --   --   ALBUMIN 2.2*  < > 1.8*  1.8* 2.5* 2.8*  < > = values in this interval not displayed.  Cardiac Enzymes  Recent Labs Lab 06/08/15 0808 06/08/15 1410 06/08/15 1852  TROPONINI 0.08* 0.32* 0.27*    Glucose  Recent Labs Lab 06/13/15 1148 06/13/15 1520 06/13/15 1947 06/14/15 0012 06/14/15 0405 06/14/15 0739  GLUCAP 120* 128* 85 116* 113* 115*    Imaging Dg Abd 1 View  06/13/2015  CLINICAL DATA:  Feeding tube  placement under fluoroscopy today. Initial encounter. EXAM: ABDOMEN - 1 VIEW COMPARISON:  None. FINDINGS: Feeding tube was placed under fluoroscopy by Radiology technologist Tara Dingus. Fluoroscopic spot image demonstrates the tip in good position at the ligament of Treitz. IMPRESSION: Successful feeding tube placement. Electronically Signed   By: Inge Rise M.D.   On: 06/13/2015 14:47   Dg Chest Port 1 View  06/14/2015  CLINICAL DATA:  Acute respiratory failure, septic shock, acute renal failure, chronic atrial fibrillation. EXAM: PORTABLE CHEST 1 VIEW COMPARISON:  Portable chest x-ray of Jun 13, 2015 FINDINGS: The lungs are borderline hypoinflated. The interstitial markings remain increased bilaterally. Slight interval improvement in bibasilar atelectasis or pneumonia. Trace right pleural effusion. No pneumothorax. The cardiac silhouette remains enlarged and the pulmonary vascularity remains indistinct. The left hemidiaphragm remains obscured. The right hemidiaphragm is better demonstrated. The bilateral chest tubes are in stable position. The endotracheal tube tip lies 2.9 cm above the carina. The feeding tube tip projects below the inferior margin of the image. The right internal jugular venous catheter tip projects over the proximal SVC. IMPRESSION: Slight interval improvement in bibasilar atelectasis or pneumonia. Small left pleural effusion is present. No pneumothorax. Stable cardiomegaly with mild central pulmonary vascular congestion. The support tubes are in stable position. Electronically Signed   By: David  Martinique M.D.   On: 06/14/2015 07:18   Dg Addison Bailey G Tube Plc W/fl-no Rad  06/13/2015  CLINICAL DATA:  NASO G TUBE PLACEMENT WITH FLUORO Fluoroscopy was utilized by the requesting physician.  No radiographic interpretation.     STUDIES:  CXR 04/25 > Low volumes. LLL opacity may reflect atelectasis or infiltrate. Renal US 04/25 > surgically absent left kidney, no hydronephrosis, small right  renal cyst. CT A / P 05/02 > large right perinephric hematoma demonstrating increase in size since previous study. Echo 5/8 nml LV fn  CULTURES: Blood 04/25 > Klebsiella  Urine 04/25 > Multiple species present MRSA nares > negative Blood 04/27 > NGTD  ANTIBIOTICS: Ceftaz 04/25 >off Vanc 04/25 > 04/25 Ceftriaxone 04/29 >  SIGNIFICANT EVENTS: 04/25 > admitted with septic shock due to UTI. Significant ARF. 04/30 > transferred out of ICU. 05/03 > transferred back to ICU due to acute blood loss anemia presumed due to worsening right perinephric hematoma, hypotension. 5/3>> cardiac arrest  5/3>> IR embolization R perinephric hematoma  5/3- PTX left , bilateral chest tubes  for massive sub q air - from cpr 5/7 -slow progress on pressors  LINES/TUBES: R IJ HD cath 04/26 > ETT 5/3>>>  L femoral CVL 5/3>>> L femoral sheath 5/3>>> L chest tube 5/3>>> R chest tube 5/3>>>  DISCUSSION: 75 y.o. M with hx RCC and prostate CA s/p radiation 2011 and left nephrectomy with increased INR, ARF, perinephric hematoma.  ASSESSMENT / PLAN:  CARDIOVASCULAR A:  Hemorrhagic Shock - right perinephric hematoma +/- sepsis r/t klebsiella UTI  Hx a.fib (on coumadin), HLD, HTN. Cardiogenic component? CI borderline on NONinvasive- improved Hypovolemia remains? Vasoplegia? Cortisol 26 P:  Taper Levophedto maintain goal MAP > 60 Vaso off    PULMONARY A: Acute hypoxic respiratory failure. 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 Respiratory alkalosis  Extensive Sub-q emphysema - s/p bilat chest tubes  From ptx likely left from cpr P:  SBT, but no extubation CT to suction remain  pcxr daily  HEMATOLOGIC / ONCOLOGIC A:  Acute blood loss anemia/hemorrhagic shock -  right perinephric hematoma - s/p IR embolization  Coagulopathy - s/p multiple units FFP and vitamin K septic shock Hx prostate CA s/p radiation 2011, RCC s/p left nephrectomy, hx DVT / PE  (on coumadin) Leukocytosis Progressive thrombocytopenia  (no hep exposure) P:  F/u CBC in am  SCD's sheath removal, per IR,when INR 1.6 - vit K + 2 u FFP today  RENAL A:  AKI - started on temp HD this admission. CKD III -- baseline Scr ~1.9 Chronic foley w/hematuria at admission - improving, likely 2/2 infx Perinephric hematoma - etiology could be benign cause, idiopathic or recurrence of malignancy given his RCC hx; increased in size 05/02. P:  Cont CVVHD, neg 50 /h if tolerated  INFECTIOUS A:  Septic shock - due to klebsiella UTI/ bacteremia.  P:  Continue ceftriaxone, stop in place 14 ds  GASTROINTESTINAL A:  Nutrition. S/p IR NG tube,with fluoro P:  Start TFs  ppi  ENDOCRINE A:  DM. P:  Continue SSI  NEUROLOGIC A:  Pain Follows commands - mental status encouraging s/p arrest/ongoing shock  P:  Fentanyl gtt , WUA Rass goal -1   FAMILY  - Updates:  Updated wife at length at bedside.  Daily  - Inter-disciplinary family meet or Palliative Care meeting due by: done 5/8  The patient is critically ill with multiple organ systems failure and requires high complexity decision making for assessment and support, frequent evaluation and titration of therapies, application of advanced monitoring technologies and extensive interpretation of multiple databases. Critical Care Time devoted to patient care services described in this note independent of APP time is 35 minutes.    Kara Mead MD. [place] Flood. [facility] Pager [phone] If no response call [phone]   06/14/2015    06/14/2015 9:22 AM

## 2015-06-14 NOTE — Telephone Encounter (Signed)
tct-spouse Ernestene Mention: progress on patient and well being of the wife.  Did discuss the status of the patient-remains on the vent, follows some commands, is requiring hemodialysis for renal support.  Wife states that they were able to turn off the vent for a short period of time today and discontinued one of the pressor agents.  Could not support respiratory without going back on the vent.  Asked about advanced directives and the wife has been asked by the physician if reintubation and life support should continue once they feel that all measures have been tired and failed or condition does not progress.   She has thought about this but has not come to a decision.  Wife asked if he could hear.  The reply was that studies have shown that patient have become fully mentally stable and alert and did remember conversations.  Encouraged her to be positive and comforting and to touch and talk to patient has she felt comfortable.  Asked about her well being and she does have good family support with her three children and their families.  Expressed my concerns for her well being.  States that she is getting some rest and is eating.  Offered my services for any support that she might need.  Has my phone number to call has needed./Rhonda Rosana Hoes, [place].

## 2015-06-14 NOTE — Progress Notes (Signed)
[facility] Rounding Note  Subjective: Sedated. Does not follow commands.  Objective: Vital signs in last 24 hours: Temp:  [96.8 F (36 C)-98.3 F (36.8 C)] 97.7 F (36.5 C) (05/09 0738) Pulse Rate:  [76-114] 92 (05/09 0700) Resp:  [16-25] 20 (05/09 0700) BP: (86-140)/(47-108) 102/73 mmHg (05/09 0748) SpO2:  [98 %-100 %] 100 % (05/09 0700) Arterial Line BP: (84-155)/(40-81) 112/57 mmHg (05/09 0700) FiO2 (%):  [40 %] 40 % (05/09 0748) Weight:  [316 lb 12.8 oz (143.7 kg)] 316 lb 12.8 oz (143.7 kg) (05/09 0500) Weight change: 3 lb 15.5 oz (1.8 kg)  Intake/Output from previous day: 05/08 0701 - 05/09 0700 In: 2023.2 [I.V.:936.2; Blood:637; IV Piggyback:450] Out: 2042 [Urine:25; Chest Tube:30] Intake/Output this shift:   BP 102/73 mmHg  Pulse 92  Temp(Src) 97.7 F (36.5 C) (Axillary)  Resp 20  Ht '5\' 9"'  (1.753 m)  Wt 316 lb 12.8 oz (143.7 kg)  BMI 46.76 kg/m2  SpO2 100%  General appearance: Sedated and intubated. Massive anasarca, extensive SQ air chest wall ant Resp: intubated Coarse breath sounds bilaterally, chest tubes in places bilaterally, subq emphysema on chest wall noted.  Cardio: RRR GI: S, NT, ND Ext: 4+ pitting edema in LE Neuro: Sedated, Does not follow commands.   Lab Results:  Recent Labs  06/13/15 0417 06/14/15 0417  WBC 12.5* 5.7  HGB 8.5* 7.3*  HCT 27.1* 23.5*  PLT 79* 52*     Recent Labs  06/13/15 1828 06/14/15 0417  NA 138 138  K 4.3 4.3  CL 104 102  CO2 21* 24  GLUCOSE 141* 127*  BUN 17 17  CREATININE 1.82* 1.74*  CALCIUM 8.1* 8.2*  Phosphorus 2.5   Lab Results  Component Value Date   INR 2.03* 06/14/2015   INR 1.79* 06/13/2015   INR 1.85* 06/13/2015    Studies/Results: Dg Abd 1 View  06/13/2015  CLINICAL DATA:  Feeding tube placement under fluoroscopy today. Initial encounter. EXAM: ABDOMEN - 1 VIEW COMPARISON:  None. FINDINGS: Feeding tube was placed under fluoroscopy by Radiology technologist Tara Dingus. Fluoroscopic spot  image demonstrates the tip in good position at the ligament of Treitz. IMPRESSION: Successful feeding tube placement. Electronically Signed   By: Inge Rise M.D.   On: 06/13/2015 14:47   Dg Chest Port 1 View  06/14/2015  CLINICAL DATA:  Acute respiratory failure, septic shock, acute renal failure, chronic atrial fibrillation. EXAM: PORTABLE CHEST 1 VIEW COMPARISON:  Portable chest x-ray of Jun 13, 2015 FINDINGS: The lungs are borderline hypoinflated. The interstitial markings remain increased bilaterally. Slight interval improvement in bibasilar atelectasis or pneumonia. Trace right pleural effusion. No pneumothorax. The cardiac silhouette remains enlarged and the pulmonary vascularity remains indistinct. The left hemidiaphragm remains obscured. The right hemidiaphragm is better demonstrated. The bilateral chest tubes are in stable position. The endotracheal tube tip lies 2.9 cm above the carina. The feeding tube tip projects below the inferior margin of the image. The right internal jugular venous catheter tip projects over the proximal SVC. IMPRESSION: Slight interval improvement in bibasilar atelectasis or pneumonia. Small left pleural effusion is present. No pneumothorax. Stable cardiomegaly with mild central pulmonary vascular congestion. The support tubes are in stable position. Electronically Signed   By: David  Martinique M.D.   On: 06/14/2015 07:18   Dg Chest Port 1 View  06/13/2015  CLINICAL DATA:  Pneumothorax EXAM: PORTABLE CHEST 1 VIEW COMPARISON:  06/12/2015 FINDINGS: Bilateral chest tubes remain in place.  No pneumothorax. Endotracheal tube in good position. Right  jugular central venous catheter tip in the right innominate vein unchanged. Bibasilar atelectasis left greater than right is unchanged. Bilateral subcutaneous emphysema appears improved. IMPRESSION: Endotracheal tube in good position.  No pneumothorax Bibasilar atelectasis unchanged. Electronically Signed   By: Franchot Gallo M.D.   On:  06/13/2015 07:33   Dg Abd Portable 1v  06/12/2015  CLINICAL DATA:  Evaluate NG tube. EXAM: PORTABLE ABDOMEN - 1 VIEW COMPARISON:  Film from earlier today FINDINGS: The NG tube takes an unusual course. However, based on previous CT imaging, I suspect the tube probably terminates in the hiatal hernia with the distal tip flipped back on itself. Air in the subcutaneous tissues of the left chest. The ET tube remains in place. IMPRESSION: The distal tip of the NG tube appears to be within the hiatal hernia with the distal tip flipped back on itself. Findings called to the patient's nurse. Electronically Signed   By: Dorise Bullion III M.D   On: 06/12/2015 13:40   Dg Abd Portable 1v  06/12/2015  CLINICAL DATA:  Evaluate NG tube placement. EXAM: PORTABLE ABDOMEN - 1 VIEW COMPARISON:  Jun 12, 2015 FINDINGS: The NG tube still terminates in the region of left mainstem bronchus. No other interval changes. Air is seen in the subcutaneous tissues of the chest, right greater than left. IMPRESSION: The NG tube appears to terminate in the left mainstem bronchus. This finding was called to the patient's nurse. Per report, the tube has already been removed. Electronically Signed   By: Dorise Bullion III M.D   On: 06/12/2015 13:20   Dg Abd Portable 1v  06/12/2015  CLINICAL DATA:  Evaluate for feeding tube placement. EXAM: PORTABLE ABDOMEN - 1 VIEW COMPARISON:  Abdominal radiograph 06/09/2015 FINDINGS: The distal aspect of the enteric tube projects over the mid esophagus. Surgical clips within the mid abdomen. Nonobstructed bowel gas pattern. IMPRESSION: The enteric tube is located within the midesophagus. This needs to be advanced. These results will be called to the ordering clinician or representative by the Radiologist Assistant, and communication documented in the PACS or zVision Dashboard. Electronically Signed   By: Lovey Newcomer M.D.   On: 06/12/2015 11:16   Dg Addison Bailey G Tube Plc W/fl-no Rad  06/13/2015  CLINICAL DATA:  NASO  G TUBE PLACEMENT WITH FLUORO Fluoroscopy was utilized by the requesting physician.  No radiographic interpretation.    Scheduled Medications: . sodium chloride   Intravenous Once  . sodium chloride   Intravenous Once  . albumin human  25 g Intravenous Q6H  . antiseptic oral rinse  7 mL Mouth Rinse QID  . cefTRIAXone (ROCEPHIN)  IV  2 g Intravenous Q24H  . chlorhexidine gluconate (SAGE KIT)  15 mL Mouth Rinse BID  . sennosides  5 mL Oral BID   And  . docusate  100 mg Oral BID  . feeding supplement (PRO-STAT SUGAR FREE 64)  30 mL Per Tube TID  . feeding supplement (VITAL HIGH PROTEIN)  1,000 mL Per Tube Q24H  . fentaNYL (SUBLIMAZE) injection  50 mcg Intravenous Once  . insulin aspart  0-15 Units Subcutaneous Q4H  . pantoprazole (PROTONIX) IV  40 mg Intravenous Q24H  . sodium chloride flush  10-40 mL Intracatheter Q12H   . sodium chloride 10 mL/hr at 06/14/15 0600  . fentaNYL infusion INTRAVENOUS 200 mcg/hr (06/14/15 0600)  . norepinephrine (LEVOPHED) Adult infusion 7 mcg/min (06/14/15 0700)  . dialysis replacement fluid (prismasate) 300 mL/hr at 06/14/15 0415  . dialysis replacement fluid (prismasate)  400 mL/hr at 06/14/15 0921  . dialysate (PRISMASATE) 2,000 mL/hr at 06/14/15 0865    Assessment/ Plan: Pt is a 75 y.o. yo male who was admitted on 05/31/2015 with septic shock 2/2 klebsiella bacteremia. Readmitted to the ICU 06/08/2015 with hemorrhagic shock in the setting of a right perinephric hematoma. Now s/p coil embolization .  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 Patient's Cr was notably elevated at 9.33, his baseline is thought to be around 1.7-1.9. Now has bilat chest tubes r/t significant sub-q emphysema post CPR.  1. AKI/CKD, patient s/p left nephrectomy and now s/p partial right renal artery coil embolization 2/2 perinephric hematoma. Anuric.  - On CVVHD  given tenuous hemodynamic status, goal net even  - Giving colloid replacement 2. Hemorrhagic shock 2/2 Acute blood loss anemia 2/2 right perinephric hematoma. Also with coagulopathy, s/p FFP and vit K  - s/p coil embolization by IR  - On pressors per CCM  - Trend CBC, transfuse as needed  - Trend INR 3. Urosepsis / Positive blood culture pan-sensitive Kleb Pneumo. abx per primary team.   4. Coagulopathy with thrombocytopenia, elevated INR - for FFP today. Plts continue to trend down. Not getting heparin.   Dimas Chyle 06/14/2015,7:53 AM   I have seen and examined this patient and agree with plan and assessment in the above note. Remains anuric with now just a partial solitary right kidney - likelihood of renal recovery low in my mind. Tolerating CRRT but unable to pull fluid just yet despite colloid administration. Hb drifting down and will likely need transfusion next 24 hours. K and phos stable at this time. Pressors down - if BP stabilizes may have better chance to get some vol off in next few days.  Odeal Welden B,MD 06/14/2015 9:57 AM

## 2015-06-15 LAB — TYPE AND SCREEN
ABO/RH(D): A POS
Antibody Screen: NEGATIVE
Unit division: 0
Unit division: 0
Unit division: 0

## 2015-06-15 LAB — CBC
HCT: 22.9 % — ABNORMAL LOW (ref 39.0–52.0)
HCT: 26.3 % — ABNORMAL LOW (ref 39.0–52.0)
HEMOGLOBIN: 7.1 g/dL — AB (ref 13.0–17.0)
Hemoglobin: 8.3 g/dL — ABNORMAL LOW (ref 13.0–17.0)
MCH: 28.2 pg (ref 26.0–34.0)
MCH: 28.5 pg (ref 26.0–34.0)
MCHC: 31 g/dL (ref 30.0–36.0)
MCHC: 31.6 g/dL (ref 30.0–36.0)
MCV: 90.9 fL (ref 78.0–100.0)
MCV: 90.4 fL (ref 78.0–100.0)
PLATELETS: 36 10*3/uL — AB (ref 150–400)
Platelets: 44 10*3/uL — ABNORMAL LOW (ref 150–400)
RBC: 2.52 MIL/uL — AB (ref 4.22–5.81)
RBC: 2.91 MIL/uL — ABNORMAL LOW (ref 4.22–5.81)
RDW: 18 % — ABNORMAL HIGH (ref 11.5–15.5)
RDW: 17.6 % — AB (ref 11.5–15.5)
WBC: 2.8 10*3/uL — ABNORMAL LOW (ref 4.0–10.5)
WBC: 4.2 10*3/uL (ref 4.0–10.5)

## 2015-06-15 LAB — RENAL FUNCTION PANEL
ALBUMIN: 3 g/dL — AB (ref 3.5–5.0)
ALBUMIN: 2.9 g/dL — AB (ref 3.5–5.0)
ANION GAP: 11 (ref 5–15)
Anion gap: 9 (ref 5–15)
BUN: 18 mg/dL (ref 6–20)
BUN: 18 mg/dL (ref 6–20)
CALCIUM: 8.4 mg/dL — AB (ref 8.9–10.3)
CALCIUM: 8.4 mg/dL — AB (ref 8.9–10.3)
CO2: 25 mmol/L (ref 22–32)
CO2: 25 mmol/L (ref 22–32)
CREATININE: 1.49 mg/dL — AB (ref 0.61–1.24)
CREATININE: 1.48 mg/dL — AB (ref 0.61–1.24)
Chloride: 105 mmol/L (ref 101–111)
Chloride: 106 mmol/L (ref 101–111)
GFR calc Af Amer: 52 mL/min — ABNORMAL LOW (ref >60)
GFR calc non Af Amer: 45 mL/min — ABNORMAL LOW (ref >60)
GFR calc non Af Amer: 45 mL/min — ABNORMAL LOW (ref >60)
GFR, EST AFRICAN AMERICAN: 52 mL/min — AB (ref >60)
GLUCOSE: 133 mg/dL — AB (ref 65–99)
GLUCOSE: 153 mg/dL — AB (ref 65–99)
PHOSPHORUS: 2.2 mg/dL — AB (ref 2.5–4.6)
Phosphorus: 1.8 mg/dL — ABNORMAL LOW (ref 2.5–4.6)
Potassium: 4.2 mmol/L (ref 3.5–5.1)
Potassium: 4.2 mmol/L (ref 3.5–5.1)
SODIUM: 141 mmol/L (ref 135–145)
SODIUM: 140 mmol/L (ref 135–145)

## 2015-06-15 LAB — PREPARE FRESH FROZEN PLASMA
UNIT DIVISION: 0
Unit division: 0

## 2015-06-15 LAB — GLUCOSE, CAPILLARY
GLUCOSE-CAPILLARY: 110 mg/dL — AB (ref 65–99)
GLUCOSE-CAPILLARY: 129 mg/dL — AB (ref 65–99)
GLUCOSE-CAPILLARY: 131 mg/dL — AB (ref 65–99)
GLUCOSE-CAPILLARY: 132 mg/dL — AB (ref 65–99)
Glucose-Capillary: 107 mg/dL — ABNORMAL HIGH (ref 65–99)
Glucose-Capillary: 129 mg/dL — ABNORMAL HIGH (ref 65–99)

## 2015-06-15 LAB — MAGNESIUM: Magnesium: 2.3 mg/dL (ref 1.7–2.4)

## 2015-06-15 LAB — PROTIME-INR
INR: 1.58 — AB (ref 0.00–1.49)
PROTHROMBIN TIME: 18.9 s — AB (ref 11.6–15.2)

## 2015-06-15 LAB — PREPARE RBC (CROSSMATCH)

## 2015-06-15 MED ORDER — VITAL HIGH PROTEIN PO LIQD
1000.0000 mL | ORAL | Status: DC
  Administered 2015-06-16 – 2015-06-18 (×3): 1000 mL

## 2015-06-15 MED ORDER — SODIUM CHLORIDE 0.9 % IV SOLN
Freq: Once | INTRAVENOUS | Status: DC
Start: 2015-06-15 — End: 2015-06-19

## 2015-06-15 MED ORDER — SODIUM PHOSPHATES 45 MMOLE/15ML IV SOLN
10.0000 mmol | Freq: Once | INTRAVENOUS | Status: AC
  Administered 2015-06-15: 10 mmol via INTRAVENOUS
  Filled 2015-06-15 (×2): qty 3.33

## 2015-06-15 MED ORDER — PANTOPRAZOLE SODIUM 40 MG PO PACK
40.0000 mg | PACK | Freq: Every day | ORAL | Status: DC
  Administered 2015-06-15 – 2015-07-07 (×22): 40 mg
  Filled 2015-06-15 (×25): qty 20

## 2015-06-15 MED ORDER — DEXMEDETOMIDINE HCL IN NACL 400 MCG/100ML IV SOLN
0.4000 ug/kg/h | INTRAVENOUS | Status: DC
  Administered 2015-06-15: 0.4 ug/kg/h via INTRAVENOUS
  Filled 2015-06-15 (×2): qty 100

## 2015-06-15 MED ORDER — VITAL HIGH PROTEIN PO LIQD: 1000.0000 mL | ORAL | Status: DC

## 2015-06-15 NOTE — Progress Notes (Signed)
CSW provided Patient's wife with information for getting FMLA paperwork completed. Patient's wife appreciative of information provided. CSW will continue to follow.          Emiliano Dyer, LCSW [facility] ED/71M Clinical Social Worker [phone]

## 2015-06-15 NOTE — Progress Notes (Signed)
Nutrition Follow-up  DOCUMENTATION CODES:   Obesity unspecified  INTERVENTION:    Increase Vital High Protein by 10 ml every 12 hours to goal rate of 65 ml/h (1560 ml per day) with Prostat 30 ml TID to provide 1860 kcals, 181.5 gm protein, 1304 per day.  NUTRITION DIAGNOSIS:   Inadequate oral intake related to inability to eat as evidenced by NPO status.  Ongoing  GOAL:   Patient will meet greater than or equal to 90% of their needs  Progressing  MONITOR:   Vent status, Labs, Weight trends, Skin, I & O's  ASSESSMENT:   75 y.o. male with PMH as outlined below including prostate CA s/p radiation 2011 and RCC s/p left nephrectomy. He was seen in ED 4/22 for clogged catheter which was irrigated and replaced. He apparently has had hematuria for quite some time which has been attributed to scarring from prior radiation.  Discussed patient in ICU rounds and with RN today. NG tube was placed on 5/8 using fluoroscopy. TF was initiated on 5/9. Plans to increase TF rate by 10 ml every 12 hours to goal rate determined by RD. Patient tolerating Vital High Protein at 30 ml/h this morning. Remains on CRRT, removing ~150 ml fluid per hour. Patient is currently intubated on ventilator support Temp (24hrs), Avg:97.7 F (36.5 C), Min:97.3 F (36.3 C), Max:98.3 F (36.8 C)  Propofol: none  Diet Order:  Diet NPO time specified  Skin:  Reviewed, no issues  Last BM:  5/9  Height:   Ht Readings from Last 1 Encounters:  06/08/15 5\' 9"  (1.753 m)    Weight:   Wt Readings from Last 1 Encounters:  06/15/15 313 lb 15 oz (142.4 kg)  Admission weight 289 lbs (131.2 kg)  Ideal Body Weight:  72.7 kg  BMI:  Body mass index is 46.34 kg/(m^2).  Estimated Nutritional Needs:   Kcal:  [id number]  Protein:  181 g  Fluid:  1.5-1.7L  EDUCATION NEEDS:   No education needs identified at this time  Molli Barrows, [place], [place], [place] Pager [phone] After Hours Pager [phone]

## 2015-06-15 NOTE — Progress Notes (Signed)
PULMONARY / CRITICAL CARE MEDICINE   Name: [name] MRN: [id number] DOB: 12-06-1940    ADMISSION DATE:  05/31/2015 CONSULTATION DATE:  4/25  REFERRING MD:  EDP  CHIEF COMPLAINT:  Hypotension   HISTORY OF PRESENT ILLNESS:   75yo male with hx PE, OSA, CKD III (L nephrectomy r/t RCC), DM initially admitted 4/25 with sepsis r/t UTI.  Admitted by PCCM, requiring vasopressors.  He improved slowly and was tx out to med-surg on 4/30.  On 5/2 developed R flank pain and CT abd/pelvis revealed increased R perinephric hematoma worsened by heparin gtt and pt developed worsening hypotension requiring tx back to ICU and vasopressors.  Pt continued to deteriorate overnight and ultimately had cardiac arrest am 5/3 with approx 12 mins CPR.  Multiple blood products given.  Pt taken to IR for embolization of R perinephric hematoma once stabilized.  Course now c/b AKI on CKD requiring CVVHD, bilat chest tubes r/t significant sub-q emphysema post CPR.    SUBJECTIVE:  afebrile Levophed off Agitated on WUA on fent gtt   VITAL SIGNS: BP 134/91 mmHg  Pulse 100  Temp(Src) 97.9 F (36.6 C) (Oral)  Resp 17  Ht 5\' 9"  (1.753 m)  Wt 313 lb 15 oz (142.4 kg)  BMI 46.34 kg/m2  SpO2 100%  HEMODYNAMICS: CVP:  [7 mmHg-14 mmHg] 14 mmHg  VENTILATOR SETTINGS: Vent Mode:  [-] CPAP;PSV FiO2 (%):  [40 %] 40 % Set Rate:  [16 bmp] 16 bmp Vt Set:  [570 mL] 570 mL PEEP:  [5 cmH20] 5 cmH20 Pressure Support:  [10 cmH20-12 cmH20] 10 cmH20 Plateau Pressure:  [21 cmH20-24 cmH20] 22 cmH20  INTAKE / OUTPUT: I/O last 3 completed shifts: In: 3636.5 [I.V.:1312.5; Blood:1260; Other:75; NG/GT:289; IV Piggyback:700] Out: [id number] [Urine:60; [id number]; Chest Tube:120]  PHYSICAL EXAMINATION: General:  Chronically ill appearing male, acutely ill  Neuro:  Sedated, RASS -1, int FC with agitattion HEENT:  Mm moist, no JVD, ETT  Cardiovascular:  s1s2 rr not tachy Lungs:  Coarse, sub q crepitus on chest, NO leaks on chest  tube Abdomen: distended, hypoactive bs, no r/g Musculoskeletal:  Warm and dry, 2+ BLE edema , ana sarca  LABS:  BMET  Recent Labs Lab 06/14/15 0417 06/14/15 1654 06/15/15 0414  NA 138 139 141  K 4.3 4.3 4.2  CL 102 106 105  CO2 24 24 25   BUN 17 16 18   CREATININE 1.74* 1.59* 1.49*  GLUCOSE 127* 110* 133*    Electrolytes  Recent Labs Lab 06/13/15 0417  06/14/15 0417 06/14/15 1654 06/15/15 0414  CALCIUM  --   < > 8.2* 8.1* 8.4*  MG 2.4  --  2.4  --  2.3  PHOS  --   < > 2.5 2.2* 1.8*  < > = values in this interval not displayed.  CBC  Recent Labs Lab 06/14/15 0417 06/14/15 1902 06/15/15 0414  WBC 5.7 3.5* 2.8*  HGB 7.3* 7.5* 7.1*  HCT 23.5* 23.9* 22.9*  PLT 52* 41* 36*    Coag's  Recent Labs Lab 06/08/15 1954  06/14/15 0417 06/14/15 1902 06/15/15 0414  APTT 33  --   --   --   --   INR 2.77*  < > 2.03* 1.75* 1.58*  < > = values in this interval not displayed.  Sepsis Markers  Recent Labs Lab 06/09/15 0400 06/09/15 1128 06/09/15 1641 06/10/15 0305 06/11/15 0518  LATICACIDVEN 9.9* 6.4* 3.3*  --   --   PROCALCITON  --  20.49  --  14.32 12.01    ABG  Recent Labs Lab 06/09/15 1744 06/10/15 0811 06/12/15 0535  PHART 7.427 7.420 7.310*  PCO2ART 33.1* 39.0 43.7  PO2ART 91.0 124.0* 114*    Liver Enzymes  Recent Labs Lab 06/09/15 1128  06/13/15 0418  06/14/15 0417 06/14/15 1654 06/15/15 0414  AST 1199*  --  105*  --   --   --   --   ALT 527*  --  178*  --   --   --   --   ALKPHOS 107  --  129*  --   --   --   --   BILITOT 2.2*  --  2.0*  --   --   --   --   ALBUMIN 2.2*  < > 1.8*  1.8*  < > 2.8* 2.7* 3.0*  < > = values in this interval not displayed.  Cardiac Enzymes  Recent Labs Lab 06/08/15 1410 06/08/15 1852  TROPONINI 0.32* 0.27*    Glucose  Recent Labs Lab 06/14/15 1133 06/14/15 1540 06/14/15 2003 06/14/15 2349 06/15/15 0344 06/15/15 0742  GLUCAP 101* 110* 124* 110* 131* 107*    Imaging No results  found.   STUDIES:  CXR 04/25 > Low volumes. LLL opacity may reflect atelectasis or infiltrate. Renal US 04/25 > surgically absent left kidney, no hydronephrosis, small right renal cyst. CT A / P 05/02 > large right perinephric hematoma demonstrating increase in size since previous study. Echo 5/8 nml LV fn  CULTURES: Blood 04/25 > Klebsiella  Urine 04/25 > Multiple species present MRSA nares > negative Blood 04/27 > NGTD  ANTIBIOTICS: Ceftaz 04/25 >off Vanc 04/25 > 04/25 Ceftriaxone 04/29 >  SIGNIFICANT EVENTS: 04/25 > admitted with septic shock due to UTI. Significant ARF. 04/30 > transferred out of ICU. 05/03 > transferred back to ICU due to acute blood loss anemia presumed due to worsening right perinephric hematoma, hypotension. 5/3>> cardiac arrest  5/3>> IR embolization R perinephric hematoma  5/3- PTX left , bilateral chest tubes for massive sub q air - from cpr 5/7 -slow progress on pressors  LINES/TUBES: R IJ HD cath 04/26 > ETT 5/3>>>  L femoral CVL 5/3>>> L femoral sheath 5/3>>> L chest tube 5/3>>> R chest tube 5/3>>>  DISCUSSION: 75 y.o. M with hx RCC and prostate CA s/p radiation 2011 and left nephrectomy with increased INR, ARF, perinephric hematoma.  ASSESSMENT / PLAN:  CARDIOVASCULAR A:  Hemorrhagic Shock - right perinephric hematoma +/- sepsis r/t klebsiella UTI  Hx a.fib (on coumadin), HLD, HTN. Cardiogenic component? CI borderline on NONinvasive- improved Hypovolemia remains? Vasoplegia? Cortisol 26 P:  Off Levophed Vaso off    PULMONARY A: Acute hypoxic respiratory failure. 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 Respiratory alkalosis  Extensive Sub-q emphysema - s/p bilat chest tubes  From ptx likely left from cpr P:  SBT, but no extubation CT to suction remain  pcxr daily  HEMATOLOGIC / ONCOLOGIC A:  Acute blood loss anemia/hemorrhagic shock -  right perinephric hematoma - s/p IR  embolization  Coagulopathy - s/p multiple units FFP and vitamin K septic shock Hx prostate CA s/p radiation 2011, RCC s/p left nephrectomy, hx DVT / PE (on coumadin) Leukocytosis Progressive thrombocytopenia  (no hep exposure) P:  F/u CBC in am -transfuse 1 U PRBC SCD's sheath removal, per IR now that INR lower HIT panel  RENAL A:  AKI - started on temp HD this admission. CKD III --  baseline Scr ~1.9 Chronic foley w/hematuria at admission - improving, likely 2/2 infx Perinephric hematoma - etiology could be benign cause, idiopathic or recurrence of malignancy given his RCC hx; increased in size 05/02. P:  Cont CVVHD, neg 150 /h if tolerated  INFECTIOUS A:  Septic shock - due to klebsiella UTI/ bacteremia.  P:  Continue ceftriaxone, stop in place 14 ds  GASTROINTESTINAL A:  Nutrition. S/p IR NG tube,with fluoro P:  Increase TFs to goal ppi  ENDOCRINE A:  DM. P:  Continue SSI  NEUROLOGIC A:  Pain Follows commands - mental status encouraging s/p arrest/ongoing shock  P:  Fentanyl gtt , WUA Add precedex if needed Rass goal -1   FAMILY  - Updates:  Updated wife at length at bedside.  Daily  - Inter-disciplinary family meet or Palliative Care meeting due by: done 5/8  The patient is critically ill with multiple organ systems failure and requires high complexity decision making for assessment and support, frequent evaluation and titration of therapies, application of advanced monitoring technologies and extensive interpretation of multiple databases. Critical Care Time devoted to patient care services described in this note independent of APP time is 35 minutes.    Kara Mead MD. [place] Flood. [facility] Pager [phone] If no response call [phone]   06/15/2015    06/15/2015 10:36 AM

## 2015-06-15 NOTE — Progress Notes (Signed)
[facility] Rounding Note  Subjective/Interval Events: Sedated. Does not follow commands.  Wife says seemingly aware of her presence, depends on level of sedation FFP X4 last 24 hours + Vitamin K  Objective: Vital signs in last 24 hours: Temp:  [97.3 F (36.3 C)-98.1 F (36.7 C)] 98.1 F (36.7 C) (05/10 0345) Pulse Rate:  [68-106] 90 (05/10 0700) Resp:  [13-27] 20 (05/10 0700) BP: (78-141)/(48-94) 117/79 mmHg (05/10 0700) SpO2:  [98 %-100 %] 100 % (05/10 0700) Arterial Line BP: (96-165)/(51-91) 135/68 mmHg (05/10 0700) FiO2 (%):  [40 %] 40 % (05/10 0600) Weight:  [313 lb 15 oz (142.4 kg)] 313 lb 15 oz (142.4 kg) (05/10 0500) Weight change: -2 lb 13.9 oz (-1.3 kg)  Intake/Output from previous day: 05/09 0701 - 05/10 0700 In: 2926.6 [I.V.:802.6; Blood:1260; NG/GT:289; IV Piggyback:500] Out: 3479 [Urine:35; Chest Tube:90] Intake/Output this shift:   BP 117/79 mmHg  Pulse 90  Temp(Src) 98.1 F (36.7 C) (Oral)  Resp 20  Ht _0  (1.753 m)  Wt 313 lb 15 oz (142.4 kg)  BMI 46.34 kg/m2  SpO2 100%  General appearance: Sedated and intubated. Massive anasarca, extensive SQ air chest wall ant Resp: intubated Coarse breath sounds bilaterally, chest tubes in places bilaterally, subq emphysema on chest wall noted.  Cardio: RRR GI: S, NT, ND Ext: 3+ pitting edema in LE, improving Neuro: Sedated, Does not follow commands.   Lab Results:  Recent Labs  06/14/15 1902 06/15/15 0414  WBC 3.5* 2.8*  HGB 7.5* 7.1*  HCT 23.9* 22.9*  PLT 41* 36*     Recent Labs  06/14/15 1654 06/15/15 0414  NA 139 141  K 4.3 4.2  CL 106 105  CO2 24 25  GLUCOSE 110* 133*  BUN 16 18  CREATININE 1.59* 1.49*  CALCIUM 8.1* 8.4*   PHOSPHORUS  Date/Time Value Ref Range Status  06/15/2015 04:14 AM 1.8* 2.5 - 4.6 mg/dL Final  06/14/2015 04:54 PM 2.2* 2.5 - 4.6 mg/dL Final  06/14/2015 04:17 AM 2.5 2.5 - 4.6 mg/dL Final    Lab Results  Component Value Date   INR 1.58* 06/15/2015   INR 1.75*  06/14/2015   INR 2.03* 06/14/2015    Studies/Results: Dg Abd 1 View  06/13/2015  CLINICAL DATA:  Feeding tube placement under fluoroscopy today. Initial encounter. EXAM: ABDOMEN - 1 VIEW COMPARISON:  None. FINDINGS: Feeding tube was placed under fluoroscopy by Radiology technologist Tara Dingus. Fluoroscopic spot image demonstrates the tip in good position at the ligament of Treitz. IMPRESSION: Successful feeding tube placement. Electronically Signed   By: Inge Rise M.D.   On: 06/13/2015 14:47   Dg Chest Port 1 View  06/14/2015  CLINICAL DATA:  Acute respiratory failure, septic shock, acute renal failure, chronic atrial fibrillation. EXAM: PORTABLE CHEST 1 VIEW COMPARISON:  Portable chest x-ray of Jun 13, 2015 FINDINGS: The lungs are borderline hypoinflated. The interstitial markings remain increased bilaterally. Slight interval improvement in bibasilar atelectasis or pneumonia. Trace right pleural effusion. No pneumothorax. The cardiac silhouette remains enlarged and the pulmonary vascularity remains indistinct. The left hemidiaphragm remains obscured. The right hemidiaphragm is better demonstrated. The bilateral chest tubes are in stable position. The endotracheal tube tip lies 2.9 cm above the carina. The feeding tube tip projects below the inferior margin of the image. The right internal jugular venous catheter tip projects over the proximal SVC. IMPRESSION: Slight interval improvement in bibasilar atelectasis or pneumonia. Small left pleural effusion is present. No pneumothorax. Stable cardiomegaly with mild central pulmonary  vascular congestion. The support tubes are in stable position. Electronically Signed   By: David  Martinique M.D.   On: 06/14/2015 07:18   Dg Addison Bailey G Tube Plc W/fl-no Rad  06/13/2015  CLINICAL DATA:  NASO G TUBE PLACEMENT WITH FLUORO Fluoroscopy was utilized by the requesting physician.  No radiographic interpretation.    Scheduled Medications: . sodium chloride   Intravenous  Once  . sodium chloride   Intravenous Once  . sodium chloride   Intravenous Once  . sodium chloride   Intravenous Once  . antiseptic oral rinse  7 mL Mouth Rinse QID  . cefTRIAXone (ROCEPHIN)  IV  2 g Intravenous Q24H  . chlorhexidine gluconate (SAGE KIT)  15 mL Mouth Rinse BID  . sennosides  5 mL Oral BID   And  . docusate  100 mg Oral BID  . feeding supplement (PRO-STAT SUGAR FREE 64)  30 mL Per Tube TID  . feeding supplement (VITAL HIGH PROTEIN)  1,000 mL Per Tube Q24H  . fentaNYL (SUBLIMAZE) injection  50 mcg Intravenous Once  . insulin aspart  0-15 Units Subcutaneous Q4H  . pantoprazole (PROTONIX) IV  40 mg Intravenous Q24H  . sodium chloride flush  10-40 mL Intracatheter Q12H   . sodium chloride 10 mL/hr at 06/14/15 0600  . fentaNYL infusion INTRAVENOUS 200 mcg/hr (06/15/15 0500)  . norepinephrine (LEVOPHED) Adult infusion Stopped (06/14/15 1200)  . dialysis replacement fluid (prismasate) 300 mL/hr at 06/14/15 2126  . dialysis replacement fluid (prismasate) 400 mL/hr at 06/14/15 2144  . dialysate (PRISMASATE) 2,000 mL/hr at 06/15/15 9675    Assessment/ Plan: Pt is a 75 y.o. yo male who was admitted on 05/31/2015 with septic shock 2/2 klebsiella bacteremia. Readmitted to the ICU 06/08/2015 with hemorrhagic shock in the setting of a right perinephric hematoma. Now s/p coil embolization.  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 Patient's Cr was notably elevated at 9.33, his baseline is thought to be around 1.7-1.9. Now has bilat chest tubes r/t significant sub-q emphysema post CPR.  1. AKI/CKD, patient s/p left nephrectomy and now s/p partial right renal artery coil embolization 2/2 perinephric hematoma. Anuric.  - On CVVHD - off pressors now. Will try to draw small volume as tolerated Tolerating fluid removal and BP much better so wil pull meg 150/hour. K fine,  phos - 1.8 -> 10 mmoles NaPhos ordered.  - Giving colloid replacement via FFP 2. Hemorrhagic shock 2/2 Acute blood loss anemia 2/2 right perinephric hematoma. Also with coagulopathy, s/p FFP and vit K  - s/p coil embolization by IR  - On pressors per CCM  - Trend CBC, transfuse as needed 3. Urosepsis / Positive blood culture pan-sensitive Kleb Pneumo. abx per primary team. On Rocephin 4. Coagulopathy with thrombocytopenia, elevated INR   - Has been getting FFP per primary team - this will also help increase intravascular oncotic pressure  - Not getting heparin.   [name] 06/15/2015,7:41 AM   I have seen and examined this patient and agree with plan and assessment in the above note. Off pressors now, BP great, tolerating UFR of about 100/hour. Will increase UFR to neg 150/hour, replete phosphorus. No other changes at this time. Raymond Rayford B,MD 06/15/2015 9:50 AM

## 2015-06-15 NOTE — Progress Notes (Signed)
Notified IR tech Becky in regards to pulling sheath. INR is 1.58. MD Elsworth Soho said needs to be pulled today. Will continue to monitor and assess.

## 2015-06-15 NOTE — Progress Notes (Signed)
Interventional Radiology here at bedside to remove 5Fr left groin sheath.  Pt id'ed via name and DOB.  Sheath pulled at 1130 and pressure held for 20 min until hemostasis achieved.  V-Pad applied along with tegaderm and pressure dressing.  Christian RN at bedside.  No immediate complications noted.  Distal Pedal pulse +3.   hhf/jkc

## 2015-06-16 ENCOUNTER — Inpatient Hospital Stay (HOSPITAL_COMMUNITY): Payer: BLUE CROSS/BLUE SHIELD

## 2015-06-16 LAB — GLUCOSE, CAPILLARY
GLUCOSE-CAPILLARY: 140 mg/dL — AB (ref 65–99)
Glucose-Capillary: 111 mg/dL — ABNORMAL HIGH (ref 65–99)
Glucose-Capillary: 134 mg/dL — ABNORMAL HIGH (ref 65–99)
Glucose-Capillary: 115 mg/dL — ABNORMAL HIGH (ref 65–99)
Glucose-Capillary: 142 mg/dL — ABNORMAL HIGH (ref 65–99)
Glucose-Capillary: 101 mg/dL — ABNORMAL HIGH (ref 65–99)

## 2015-06-16 LAB — CBC
HEMATOCRIT: 25.3 % — AB (ref 39.0–52.0)
HEMOGLOBIN: 8 g/dL — AB (ref 13.0–17.0)
MCH: 28.4 pg (ref 26.0–34.0)
MCHC: 31.6 g/dL (ref 30.0–36.0)
MCV: 89.7 fL (ref 78.0–100.0)
Platelets: 46 10*3/uL — ABNORMAL LOW (ref 150–400)
RBC: 2.82 MIL/uL — ABNORMAL LOW (ref 4.22–5.81)
RDW: 17.8 % — ABNORMAL HIGH (ref 11.5–15.5)
WBC: 4.4 10*3/uL (ref 4.0–10.5)

## 2015-06-16 LAB — RENAL FUNCTION PANEL
ALBUMIN: 2.6 g/dL — AB (ref 3.5–5.0)
Albumin: 2.6 g/dL — ABNORMAL LOW (ref 3.5–5.0)
Anion gap: 11 (ref 5–15)
Anion gap: 9 (ref 5–15)
BUN: 20 mg/dL (ref 6–20)
BUN: 23 mg/dL — ABNORMAL HIGH (ref 6–20)
CALCIUM: 8.3 mg/dL — AB (ref 8.9–10.3)
CO2: 24 mmol/L (ref 22–32)
CO2: 27 mmol/L (ref 22–32)
CREATININE: 1.48 mg/dL — AB (ref 0.61–1.24)
CREATININE: 1.46 mg/dL — AB (ref 0.61–1.24)
Calcium: 8.2 mg/dL — ABNORMAL LOW (ref 8.9–10.3)
Chloride: 104 mmol/L (ref 101–111)
Chloride: 104 mmol/L (ref 101–111)
GFR calc Af Amer: 52 mL/min — ABNORMAL LOW (ref >60)
GFR, EST AFRICAN AMERICAN: 53 mL/min — AB (ref >60)
GFR, EST NON AFRICAN AMERICAN: 45 mL/min — AB (ref >60)
GFR, EST NON AFRICAN AMERICAN: 46 mL/min — AB (ref >60)
GLUCOSE: 137 mg/dL — AB (ref 65–99)
Glucose, Bld: 158 mg/dL — ABNORMAL HIGH (ref 65–99)
PHOSPHORUS: 1.6 mg/dL — AB (ref 2.5–4.6)
PHOSPHORUS: 2 mg/dL — AB (ref 2.5–4.6)
POTASSIUM: 4 mmol/L (ref 3.5–5.1)
POTASSIUM: 3.9 mmol/L (ref 3.5–5.1)
SODIUM: 139 mmol/L (ref 135–145)
Sodium: 140 mmol/L (ref 135–145)

## 2015-06-16 LAB — TYPE AND SCREEN
ABO/RH(D): A POS
ANTIBODY SCREEN: NEGATIVE
UNIT DIVISION: 0

## 2015-06-16 LAB — POCT I-STAT 3, ART BLOOD GAS (G3+)
BICARBONATE: 25.4 meq/L — AB (ref 20.0–24.0)
O2 SAT: 99 %
TCO2: 27 mmol/L (ref 0–100)
pCO2 arterial: 43.8 mmHg (ref 35.0–45.0)
pH, Arterial: 7.372 (ref 7.350–7.450)
pO2, Arterial: 142 mmHg — ABNORMAL HIGH (ref 80.0–100.0)

## 2015-06-16 LAB — PREPARE FRESH FROZEN PLASMA
UNIT DIVISION: 0
Unit division: 0

## 2015-06-16 LAB — HEPARIN INDUCED PLATELET AB (HIT ANTIBODY): HEPARIN INDUCED PLT AB: 0.192 {OD_unit} (ref 0.000–0.400)

## 2015-06-16 LAB — MAGNESIUM: Magnesium: 2.2 mg/dL (ref 1.7–2.4)

## 2015-06-16 MED ORDER — SODIUM PHOSPHATES 45 MMOLE/15ML IV SOLN
10.0000 mmol | Freq: Once | INTRAVENOUS | Status: AC
  Administered 2015-06-16: 10 mmol via INTRAVENOUS
  Filled 2015-06-16: qty 3.33

## 2015-06-16 MED ORDER — DEXTROSE 5 % IV SOLN
10.0000 mmol | Freq: Two times a day (BID) | INTRAVENOUS | Status: DC
  Administered 2015-06-16 – 2015-06-23 (×14): 10 mmol via INTRAVENOUS
  Filled 2015-06-16 (×16): qty 3.33

## 2015-06-16 NOTE — Progress Notes (Signed)
[facility] Rounding Note  Subjective/Interval Events: Sedated. Does not follow commands.  1u pRBC tranfused yesterday.  Tolerating 150/hour off with CRRT/all 4K fluids/no heparin  Objective: Vital signs in last 24 hours: Temp:  [97.4 F (36.3 C)-98.3 F (36.8 C)] 97.4 F (36.3 C) (05/11 0359) Pulse Rate:  [46-115] 107 (05/11 0700) Resp:  [15-28] 21 (05/11 0700) BP: (103-188)/(60-95) 134/89 mmHg (05/11 0700) SpO2:  [86 %-100 %] 96 % (05/11 0700) Arterial Line BP: (81-184)/(41-159) 148/79 mmHg (05/11 0700) FiO2 (%):  [30 %-40 %] 30 % (05/11 0355) Weight:  [301 lb 2.4 oz (136.6 kg)] 301 lb 2.4 oz (136.6 kg) (05/11 0337) Weight change: -12 lb 12.6 oz (-5.8 kg)  Intake/Output from previous day: 05/10 0701 - 05/11 0700 In: 2313.2 [I.V.:774.2; Blood:335; NG/GT:860; IV Piggyback:344] Out: 4365 [Urine:40; Chest Tube:70] Intake/Output this shift:   BP 134/89 mmHg  Pulse 107  Temp(Src) 97.4 F (36.3 C) (Oral)  Resp 21  Ht _0  (1.753 m)  Wt 301 lb 2.4 oz (136.6 kg)  BMI 44.45 kg/m2  SpO2 96%  General appearance: Sedated and intubated. Massive anasarca Resp: intubated Coarse breath sounds bilaterally, chest tubes in places bilaterally, subq emphysema on chest wall noted, improving Cardio: RRR with occasional ectopy GI: S, NT, ND Ext: 3+ pitting edema in LE, improving though still very significant Neuro: Sedated, Does not follow commands.  Small amount reddish brown urine in foley bag  Lab Results:  Recent Labs  06/15/15 1630 06/16/15 0500  WBC 4.2 4.4  HGB 8.3* 8.0*  HCT 26.3* 25.3*  PLT 44* 46*     Recent Labs  06/15/15 1630 06/16/15 0500  NA 140 139  K 4.2 4.0  CL 106 104  CO2 25 24  GLUCOSE 153* 137*  BUN 18 20  CREATININE 1.48* 1.48*  CALCIUM 8.4* 8.2*   PHOSPHORUS  Date/Time Value Ref Range Status  06/16/2015 05:00 AM 1.6* 2.5 - 4.6 mg/dL Final  06/15/2015 04:30 PM 2.2* 2.5 - 4.6 mg/dL Final  06/15/2015 04:14 AM 1.8* 2.5 - 4.6 mg/dL Final    Lab  Results  Component Value Date   INR 1.58* 06/15/2015   INR 1.75* 06/14/2015   INR 2.03* 06/14/2015    Studies/Results: No results found.  Scheduled Medications: . sodium chloride   Intravenous Once  . sodium chloride   Intravenous Once  . sodium chloride   Intravenous Once  . sodium chloride   Intravenous Once  . sodium chloride   Intravenous Once  . antiseptic oral rinse  7 mL Mouth Rinse QID  . chlorhexidine gluconate (SAGE KIT)  15 mL Mouth Rinse BID  . sennosides  5 mL Oral BID   And  . docusate  100 mg Oral BID  . feeding supplement (PRO-STAT SUGAR FREE 64)  30 mL Per Tube TID  . fentaNYL (SUBLIMAZE) injection  50 mcg Intravenous Once  . insulin aspart  0-15 Units Subcutaneous Q4H  . pantoprazole sodium  40 mg Per Tube Q1200  . sodium chloride flush  10-40 mL Intracatheter Q12H   . sodium chloride 10 mL/hr at 06/14/15 0600  . dexmedetomidine Stopped (06/15/15 2310)  . feeding supplement (VITAL HIGH PROTEIN) 1,000 mL (06/16/15 0619)  . fentaNYL infusion INTRAVENOUS 200 mcg/hr (06/16/15 0157)  . norepinephrine (LEVOPHED) Adult infusion Stopped (06/14/15 1200)  . dialysis replacement fluid (prismasate) 300 mL/hr at 06/15/15 1507  . dialysis replacement fluid (prismasate) 400 mL/hr at 06/16/15 0022  . dialysate (PRISMASATE) 2,000 mL/hr at 06/16/15 0256  Assessment/ Plan: Pt is a 75 y.o. yo male who was admitted on 05/31/2015 with septic shock 2/2 klebsiella bacteremia. Readmitted to the ICU 06/08/2015 with hemorrhagic shock in the setting of a right perinephric hematoma. Now s/p coil embolization.  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 Patient's Cr was notably elevated at 9.33, his baseline is thought to be around 1.7-1.9. Now has bilat chest tubes r/t significant sub-q emphysema post CPR.  1. AKI/CKD, patient s/p left nephrectomy and now s/p partial  right renal artery coil embolization 2/2 perinephric hematoma. Anuric. Doubtful that patient will have any significant renal recovery. Will likely be dialysis dependent moving forward.   - On CVVHD - off pressors now. Will continue to try to draw volume as tolerated (Goal -144m/hr)  - Giving colloid replacement via FFP  - Giving phos repletion 188ml IV bid  2. Hemorrhagic shock 2/2 Acute blood loss anemia 2/2 right perinephric hematoma. Also with Coagulopathy with thrombocytopenia, elevated INR. S/p coil embolization by IR. s/p FFP and vit K. BPs now stable off pressors though still has downward trending Hgb.  - Has been getting FFP and pRBC per CCM - this will also help increase intravascular oncotic pressure  - Not getting heparin.   - received 1 unit PRBC's 5/10 Hb now about 8 3. Urosepsis / Positive blood culture pan-sensitive Kleb Pneumo. abx per primary team. On Rocephin  CaDimas Chyle/12/2015,7:37 AM   I have seen and examined this patient and agree with plan and assessment in the above note. Tolerating volume removal well. Weight down from max of 74->136.6. K fine, requiring phos replacement so have added BID dosing of 10 mmoles. No heparin. Continue same net neg 150/hour. Urine negligible. Varsha Knock B,MD 06/16/2015 9:53 AM

## 2015-06-16 NOTE — Progress Notes (Signed)
PULMONARY / CRITICAL CARE MEDICINE   Name: [name] MRN: [id number] DOB: [birth year]    ADMISSION DATE:  05/31/2015 CONSULTATION DATE:  4/25  REFERRING MD:  EDP  CHIEF COMPLAINT:  Hypotension   HISTORY OF PRESENT ILLNESS:   75yo male with hx PE, OSA, CKD III (L nephrectomy r/t RCC), DM initially admitted 4/25 with sepsis r/t UTI.  Admitted by PCCM, requiring vasopressors.  He improved slowly and was tx out to med-surg on 4/30.  On 5/2 developed R flank pain and CT abd/pelvis revealed increased R perinephric hematoma worsened by heparin gtt and pt developed worsening hypotension requiring tx back to ICU and vasopressors.  Pt continued to deteriorate overnight and ultimately had cardiac arrest am 5/3 with approx 12 mins CPR.  Multiple blood products given.  Pt taken to IR for embolization of R perinephric hematoma once stabilized.  Course now c/b AKI on CKD requiring CVVHD, bilat chest tubes r/t significant sub-q emphysema post CPR.    SUBJECTIVE:  afebrile Agitated on WUA, on fent gtt Off precedex due to soft BP   VITAL SIGNS: BP 130/82 mmHg  Pulse 71  Temp(Src) 97.4 F (36.3 C) (Oral)  Resp 22  Ht 5\' 9"  (1.753 m)  Wt 301 lb 2.4 oz (136.6 kg)  BMI 44.45 kg/m2  SpO2 100%  HEMODYNAMICS: CVP:  [10 mmHg-14 mmHg] 13 mmHg  VENTILATOR SETTINGS: Vent Mode:  [-] PRVC FiO2 (%):  [30 %-40 %] 30 % Set Rate:  [16 bmp] 16 bmp Vt Set:  [570 mL] 570 mL PEEP:  [5 cmH20] 5 cmH20 Pressure Support:  [10 cmH20] 10 cmH20 Plateau Pressure:  [16 cmH20] 16 cmH20  INTAKE / OUTPUT: I/O last 3 completed shifts: In: 3747.2 [I.V.:1249.2; Blood:779; Other:75; NG/GT:1100; IV Piggyback:544] Out: 6615 [Urine:75; Other:6380; Chest Tube:160]  PHYSICAL EXAMINATION: General:  Chronically ill appearing male, acutely ill  Neuro:  Sedated, RASS -1, int FC with agitattion HEENT:  Mm moist, no JVD, ETT  Cardiovascular:  s1s2 rr not tachy Lungs:  Coarse, sub q crepitus on chest, NO leaks on chest  tube Abdomen: distended, hypoactive bs, no r/g Musculoskeletal:  Warm and dry, 2+ BLE edema , ana sarca  LABS:  BMET  Recent Labs Lab 06/15/15 0414 06/15/15 1630 06/16/15 0500  NA 141 140 139  K 4.2 4.2 4.0  CL 105 106 104  CO2 25 25 24   BUN 18 18 20   CREATININE 1.49* 1.48* 1.48*  GLUCOSE 133* 153* 137*    Electrolytes  Recent Labs Lab 06/14/15 0417  06/15/15 0414 06/15/15 1630 06/16/15 0500  CALCIUM 8.2*  < > 8.4* 8.4* 8.2*  MG 2.4  --  2.3  --  2.2  PHOS 2.5  < > 1.8* 2.2* 1.6*  < > = values in this interval not displayed.  CBC  Recent Labs Lab 06/15/15 0414 06/15/15 1630 06/16/15 0500  WBC 2.8* 4.2 4.4  HGB 7.1* 8.3* 8.0*  HCT 22.9* 26.3* 25.3*  PLT 36* 44* 46*    Coag's  Recent Labs Lab 06/14/15 0417 06/14/15 1902 06/15/15 0414  INR 2.03* 1.75* 1.58*    Sepsis Markers  Recent Labs Lab 06/09/15 1128 06/09/15 1641 06/10/15 0305 06/11/15 0518  LATICACIDVEN 6.4* 3.3*  --   --   PROCALCITON 20.49  --  14.32 12.01    ABG  Recent Labs Lab 06/09/15 1744 06/10/15 0811 06/12/15 0535  PHART 7.427 7.420 7.310*  PCO2ART 33.1* 39.0 43.7  PO2ART 91.0 124.0* 114*    Liver Enzymes  Recent Labs Lab 06/09/15 1128  06/13/15 0418  06/15/15 0414 06/15/15 1630 06/16/15 0500  AST 1199*  --  105*  --   --   --   --   ALT 527*  --  178*  --   --   --   --   ALKPHOS 107  --  129*  --   --   --   --   BILITOT 2.2*  --  2.0*  --   --   --   --   ALBUMIN 2.2*  < > 1.8*  1.8*  < > 3.0* 2.9* 2.6*  < > = values in this interval not displayed.  Cardiac Enzymes No results for input(s): TROPONINI, PROBNP in the last 168 hours.  Glucose  Recent Labs Lab 06/15/15 0742 06/15/15 1156 06/15/15 1515 06/15/15 1948 06/15/15 2329 06/16/15 0401  GLUCAP 107* 132* 129* 129* 140* 111*    Imaging No results found.   STUDIES:  CXR 04/25 > Low volumes. LLL opacity may reflect atelectasis or infiltrate. Renal US 04/25 > surgically absent left  kidney, no hydronephrosis, small right renal cyst. CT A / P 05/02 > large right perinephric hematoma demonstrating increase in size since previous study. Echo 5/8 nml LV fn  CULTURES: Blood 04/25 > Klebsiella  Urine 04/25 > Multiple species present MRSA nares > negative Blood 04/27 > NGTD  ANTIBIOTICS: Ceftaz 04/25 >off Vanc 04/25 > 04/25 Ceftriaxone 04/29 >  SIGNIFICANT EVENTS: 04/25 > admitted with septic shock due to UTI. Significant ARF. 04/30 > transferred out of ICU. 05/03 > transferred back to ICU due to acute blood loss anemia presumed due to worsening right perinephric hematoma, hypotension. 5/3>> cardiac arrest  5/3>> IR embolization R perinephric hematoma  5/3- PTX left , bilateral chest tubes for massive sub q air - from cpr 5/7 -slow progress on pressors  LINES/TUBES: R IJ HD cath 04/26 > ETT 5/3>>>  L femoral CVL 5/3>>> L femoral sheath 5/3>>> L chest tube 5/3>>> R chest tube 5/3>>>  DISCUSSION: 75 y.o. M with hx RCC and prostate CA s/p radiation 2011 and left nephrectomy with increased INR, ARF, perinephric hematoma.  ASSESSMENT / PLAN:  CARDIOVASCULAR A:  Hemorrhagic Shock - right perinephric hematoma +/- sepsis r/t klebsiella UTI  Hx a.fib (on coumadin), HLD, HTN. Cardiogenic component? CI borderline on NONinvasive- improved Hypovolemia remains? Vasoplegia? Cortisol 26 P:  Off pressors    PULMONARY A: Acute hypoxic respiratory failure. 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 Respiratory alkalosis  Extensive Sub-q emphysema - s/p bilat chest tubes  From ptx likely left from cpr P:  SBT, but no extubation CT to suction remain  pcxr daily  HEMATOLOGIC / ONCOLOGIC A:  Acute blood loss anemia/hemorrhagic shock -  right perinephric hematoma - s/p IR embolization  Coagulopathy - s/p multiple units FFP and vitamin K septic shock Hx prostate CA s/p radiation 2011, RCC s/p left nephrectomy, hx DVT /  PE (on coumadin) Leukocytosis Progressive thrombocytopenia  (no hep exposure) P:  F/u CBC daily - plts rebounding, Hb stable so presume bleeding has stopped SCD's HIT panel  RENAL A:  AKI - started on temp HD this admission. CKD III -- baseline Scr ~1.9 Chronic foley w/hematuria at admission - improving, likely 2/2 infx Perinephric hematoma - etiology could be benign cause, idiopathic or recurrence of malignancy given his RCC hx; increased in size 05/02. P:  Cont CVVHD, neg 150 /h if tolerated  INFECTIOUS A:  Septic shock - due to klebsiella UTI/ bacteremia.  P:  Continue ceftriaxone, stop in place 14 ds  GASTROINTESTINAL A:  Nutrition. S/p IR NG tube,with fluoro P:  Increased TFs to goal ppi  ENDOCRINE A:  DM. P:  Continue SSI  NEUROLOGIC A:  Pain Follows commands - mental status encouraging s/p arrest/ongoing shock  P:  Fentanyl gtt , WUA Low dose precedex if needed Rass goal 0   FAMILY  - Updates:  Updated wife at length at bedside.  Daily  - Inter-disciplinary family meet or Palliative Care meeting due by: done 5/8  Summary - Improving organ failure , shock, bleeding resolved - severely deconditioned - extubate vs trach?  The patient is critically ill with multiple organ systems failure and requires high complexity decision making for assessment and support, frequent evaluation and titration of therapies, application of advanced monitoring technologies and extensive interpretation of multiple databases. Critical Care Time devoted to patient care services described in this note independent of APP time is 35 minutes.    Kara Mead MD. [place]. [facility] Pager [phone] If no response call [phone]   06/16/2015    06/16/2015 8:52 AM

## 2015-06-16 NOTE — Consult Note (Signed)
Chief Complaint: Patient was seen in consultation today for tunneled hemodialysis catheter placement Chief Complaint  Patient presents with  . Hypotension   at the request of Dr Jamal Maes  Referring Physician(s): Dr Jamal Maes Dr Kara Mead  Supervising Physician: Sandi Mariscal  Patient Status: In-pt   History of Present Illness: [name] is a 75 y.o. male   Hx Left nephrectomy secondary RCC Treated initially with UTI--bacteremia Hx Afib and DVT---was on coumadin Hemorrhagic shock secondary Rt perinephric hematoma Cardiac arrest--CPR Coil embolization performed in IR 06/08/15 Intubated/vent AKI/CKD- now on vent and CVVHD (Rt IJ Trialysis cath- placed by CCM) Bilateral chest tubes; subq emphysema post CPR Has been followed with Dr Lorrene Reid Nephrology Doubtful renal recovery -- probable dialysis dependent from now  INR 1.58 today plt 46 afeb  Request for tunneled HD catheter placement  Past Medical History  Diagnosis Date  . Obesity   . Phlebitis     Lower extermity  . Pulmonary emboli ([place]) 2008    submassive, saddle  . Prostate cancer ([place]) 07/2009  . Sleep apnea     on CPAP  . Hx of echocardiogram 12/04/2010    Normal EF >55% no significant valve disease  . History of stress test 06/27/2009    Low risk and EF of approximately 50%  . DVT (deep venous thrombosis) ([place])   . Chronic kidney disease, stage 3     baseline creatinine ~1.4  . HLD (hyperlipidemia)   . HTN (hypertension)   . Dysrhythmia     A fib  . Diabetes mellitus ([place])     diet controlled  . History of hiatal hernia     Past Surgical History  Procedure Laterality Date  . Nephrectomy  1999    for CA  . Cholecystectomy  1999  . Transurethral resection of bladder tumor N/A 03/04/2013    Procedure: CYSTOSCOPY WITH RIGHT RETROGRADE PYELOGRAM AND BLADDER BIOPSY /CLOT EVACUATION/ BIOPSY PROSTATIC URETHRA WITH FULGERATION ;  Surgeon: Molli Hazard, MD;  Location: [facility];   Service: Urology;  Laterality: N/A;  . Cystoscopy w/ retrogrades N/A 04/08/2013    Procedure: CYSTOSCOPY WITH CLOT EVACUATION;  Surgeon: Molli Hazard, MD;  Location: [facility];  Service: Urology;  Laterality: N/A;  . Left shoulder repair    . Shoulder open rotator cuff repair Right 05/03/2015    Procedure: RIGHT SHOULDER ROTATOR CUFF REPAIR OPEN WITH GRAFT AND ANCHOR ;  Surgeon: Latanya Maudlin, MD;  Location: [facility];  Service: Orthopedics;  Laterality: Right;    Allergies: Review of patient's allergies indicates no known allergies.  Medications: Prior to Admission medications   Medication Sig Start Date End Date Taking? Authorizing Provider  carvedilol (COREG) 3.125 MG tablet take 1 tablet by mouth twice a day with meals 01/25/15  Yes Troy Sine, MD  fenofibrate (TRICOR) 145 MG tablet Take 145 mg by mouth daily.  10/18/12  Yes Historical Provider, MD  ferrous sulfate 325 (65 FE) MG EC tablet take 1 tablet by mouth once daily 01/17/15  Yes Troy Sine, MD  furosemide (LASIX) 40 MG tablet take 1 tablet by mouth once daily 01/10/15  Yes Troy Sine, MD  HYDROcodone-acetaminophen (NORCO/VICODIN) 5-325 MG tablet Take 1-2 tablets by mouth every 4 (four) hours as needed (breakthrough pain). 05/04/15  Yes Amber Cecilio Asper, PA-C  Niacin CR 1000 MG TBCR Take 1,000 mg by mouth at bedtime.  11/06/12  Yes Historical Provider, MD  polyvinyl alcohol (LIQUIFILM TEARS) 1.4 %  ophthalmic solution Place 1 drop into both eyes 2 (two) times daily as needed for dry eyes.   Yes Historical Provider, MD  potassium chloride (K-DUR,KLOR-CON) 10 MEQ tablet take 1 tablet by mouth once daily 01/25/15  Yes Troy Sine, MD  quinapril (ACCUPRIL) 20 MG tablet Take 20 mg by mouth daily.  11/06/14  Yes Historical Provider, MD  warfarin (COUMADIN) 1 MG tablet Take 1 mg by mouth every Monday, Wednesday, and Friday.   Yes Historical Provider, MD  warfarin (COUMADIN) 5 MG tablet Take 5-6 mg by mouth daily. 6 mg on Monday,  Wednesday, and Friday.  Take 5 mg on Tuesday, Thursday, Saturday, and Sunday. 11/13/12  Yes Historical Provider, MD     Family History  Problem Relation Age of Onset  . Cancer Mother   . Diabetes Father   . Cancer Father     Social History   Social History  . Marital Status: Married    Spouse Name: N/A  . Number of Children: N/A  . Years of Education: N/A   Social History Main Topics  . Smoking status: Never Smoker   . Smokeless tobacco: Never Used  . Alcohol Use: 0.0 oz/week    0 Standard drinks or equivalent per week     Comment: occasional beer or wine  . Drug Use: No  . Sexual Activity: Not Asked   Other Topics Concern  . None   Social History Narrative     Review of Systems: A 12 point ROS discussed and pertinent positives are indicated in the HPI above.  All other systems are negative.  Review of Systems  Constitutional:       No response  Respiratory:       Vent  Psychiatric/Behavioral: Negative for agitation.    Vital Signs: BP 109/86 mmHg  Pulse 96  Temp(Src) 97.4 F (36.3 C) (Oral)  Resp 23  Ht 5\' 9"  (1.753 m)  Wt 301 lb 2.4 oz (136.6 kg)  BMI 44.45 kg/m2  SpO2 100%  Physical Exam  Cardiovascular: Normal rate and regular rhythm.   Pulmonary/Chest: He is in respiratory distress.  vent  Abdominal: Soft.  Skin: Skin is warm.  Psychiatric:  Consented wife at bedside  Nursing note and vitals reviewed.   Mallampati Score:  MD Evaluation Airway: Other (comments) Airway comments: trach/vent Heart: WNL Abdomen: WNL Abdomen comments: large perinephric hematoma Chest/ Lungs: Other (comments) Chest/ lungs comments: trach/vent ASA  Classification: 3 Mallampati/Airway Score: Three  Imaging: Ct Abdomen Pelvis Wo Contrast  06/08/2015  CLINICAL DATA:  Decreased hemoglobin and increased abdominal pain. EXAM: CT ABDOMEN AND PELVIS WITHOUT CONTRAST TECHNIQUE: Multidetector CT imaging of the abdomen and pelvis was performed following the standard  protocol without IV contrast. COMPARISON:  06/02/2015 FINDINGS: Moderate size bilateral pleural effusions with basilar atelectasis. Effusions are increased since previous study. Large esophageal hiatal hernia behind the heart. Large right perinephric hematoma with hematocrit levels demonstrated. The hematoma surrounds the right kidney and displaces the kidney towards the midline. The hematoma measures about 14.2 x 18 x 20.7 cm. The hematoma is measuring larger than on the previous study suggesting progression. Hematoma extends into the right pericolic gutter and para renal fat Scholl planes. The left kidney is not identified and may be surgically absent. Surgically absent gallbladder. No bile duct dilatation. Unenhanced appearance of liver, spleen, pancreas, adrenal glands, abdominal aorta, and inferior vena cava are unremarkable. Mild mesenteric infiltration suggesting mild mesenteric hemorrhage. Stomach, small bowel, and colon are not abnormally distended. No  free air in the abdomen. Pelvis: Foley catheter deflects the bladder. Prostate gland is enlarged. Diverticulosis of the sigmoid colon without evidence of diverticulitis. Degenerative changes in the spine and hips. No destructive bone lesions. IMPRESSION: Large right perinephric hematoma demonstrating increase in size since previous study. Bilateral pleural effusions with basilar atelectasis also increasing. Electronically Signed   By: Lucienne Capers M.D.   On: 06/08/2015 00:57   Ct Abdomen Pelvis Wo Contrast  06/02/2015  CLINICAL DATA:  Mid abdomen pain. Acute renal failure, evaluate kidneys. EXAM: CT ABDOMEN AND PELVIS WITHOUT CONTRAST TECHNIQUE: Multidetector CT imaging of the abdomen and pelvis was performed following the standard protocol without IV contrast. COMPARISON:  March 15, 2014 FINDINGS: Lower chest: There are small bilateral pleural effusions. Mild atelectasis of the posterior lung bases are noted. The heart size is normal. Hepatobiliary:  The liver is normal appearing on this noncontrast exam. No focal masses identified. Patient status post prior cholecystectomy. Pancreas: No mass or inflammatory process identified on this un-enhanced exam. Spleen: Within normal limits in size. Adrenals/Urinary Tract: The patient is status post left nephrectomy. There is a large right perinephric hematoma displacing the kidney superiorly and medially. There is no right hydronephrosis. Evaluation of right kidney is limited without intravenous contrast. The adrenal glands are normal. The bladder is decompressed with a Foley catheter in place. Stomach/Bowel: No evidence of obstruction, inflammatory process, or abnormal fluid collections. There is a hiatal hernia. There is diverticulosis of colon. Vascular/Lymphatic: No pathologically enlarged lymph nodes. No evidence of abdominal aortic aneurysm. There is atherosclerosis of the aorta. Reproductive: No mass or other significant abnormality. Other: There is ascites in the abdomen and pelvis. Musculoskeletal: Degenerative joint changes of the spine are identified. Stable focal sclerosis is identified in the right ilium unchanged. IMPRESSION: Large right perinephric hematoma displacing the right kidney superiorly and medially. There is no right hydronephrosis. Prior left nephrectomy. Small amount of ascites in the abdomen and pelvis. Bilateral pleural effusions. Electronically Signed   By: Abelardo Diesel M.D.   On: 06/02/2015 13:37   Dg Chest 2 View  05/31/2015  CLINICAL DATA:  75 year old male with hypotension concerning for sepsis EXAM: CHEST  2 VIEW COMPARISON:  Prior chest x-ray 03/18/2013 FINDINGS: Low inspiratory volumes with bibasilar atelectasis. There is a left lower lobe retrocardiac opacity resolving in a positive spine sign on the lateral view. Cardiac and mediastinal contours are enlarged but otherwise unchanged. Moderate hiatal hernia again noted. No pulmonary edema, pleural effusion or pneumothorax. No acute  osseous abnormality. IMPRESSION: 1. Left lower lobe opacity may reflect atelectasis or infiltrate. Given the clinical history of sepsis, pneumonia is not excluded. 2. Low inspiratory volumes. 3. Moderate hiatal hernia. Electronically Signed   By: Jacqulynn Cadet M.D.   On: 05/31/2015 13:25   Dg Abd 1 View  06/13/2015  CLINICAL DATA:  Feeding tube placement under fluoroscopy today. Initial encounter. EXAM: ABDOMEN - 1 VIEW COMPARISON:  None. FINDINGS: Feeding tube was placed under fluoroscopy by Radiology technologist Tara Dingus. Fluoroscopic spot image demonstrates the tip in good position at the ligament of Treitz. IMPRESSION: Successful feeding tube placement. Electronically Signed   By: Inge Rise M.D.   On: 06/13/2015 14:47   Ir Angiogram Renal Right Selective  06/08/2015  INDICATION: Large acute right perinephric hematoma, requiring cardiopulmonary resuscitation EXAM: IR RENAL SUPRASEL UNILATERAL S+I MODERATE SEDATION; ARTERIOGRAPHY; IR EMBO ART VEN HEMORR LYMPH EXTRAV INC GUIDE ROADMAPPING; IR ULTRASOUND GUIDANCE VASC ACCESS RIGHT MEDICATIONS: 1% lidocaine locally. ANESTHESIA/SEDATION: Patient is already  ventilated and sedated. The patient's level of consciousness and vital signs were monitored continuously by radiology nursing throughout the procedure under my direct supervision. CONTRAST:  75cc Isovue 300 FLUOROSCOPY TIME:  Fluoroscopy Time: 5 minutes 30 seconds (1,687 mGy). COMPLICATIONS: None immediate. PROCEDURE: Informed consent was obtained from the patient family following explanation of the procedure, risks, benefits and alternatives. The patient understands, agrees and consents for the procedure. All questions were addressed. A time out was performed prior to the initiation of the procedure. Maximal barrier sterile technique utilized including caps, mask, sterile gowns, sterile gloves, large sterile drape, hand hygiene, and Betadine prep. Under sterile conditions and local anesthesia,  ultrasound micropuncture access performed of the patent left common femoral artery. Five fr sheath inserted over a Bentson guidewire. C2 catheter advanced into the right renal artery. Catheter advanced into the main right renal artery over a Glidewire. Selective right renal angiogram performed. Right renal angiogram: Main right renal artery is patent. Anterior and posterior divisions are patent. Segmental branch to the lower pole demonstrates a small peripheral focal arterial blush / pseudo aneurysm compatible with an active bleeding site. The right renal segmental and intraparenchymal branches demonstrate diffuse vaso constriction related to resuscitation. C2 catheter was advanced into the right renal lower pole segmental branch over a Glidewire. Selective angiogram performed of the right kidney lower pole branch. This is the dominant arterial branch feeding the peripheral right renal bleeding site. Embolization: From this location, 035 3 mm M rey Cook coils (2) and 035 6 mm Nester coils (2) were deployed within the right lower pole segmental branch. Post embolization angiogram confirms complete occlusion of the right lower pole branch. Final right renal angiogram demonstrates no further active bleeding or other sites. Access removed. Left femoral sheet secured externally to an arterial flushed because of coagulopathy. IMPRESSION: Peripheral right kidney lower pole bleeding site localized. Segmental right lower pole feeding artery was catheterized with a 5 French C2 catheter for successful complete coil embolization as above. Electronically Signed   By: Jerilynn Mages.  Shick M.D.   On: 06/08/2015 10:25   Ir Angiogram Follow Up Study  06/08/2015  INDICATION: Large acute right perinephric hematoma, requiring cardiopulmonary resuscitation EXAM: IR RENAL SUPRASEL UNILATERAL S+I MODERATE SEDATION; ARTERIOGRAPHY; IR EMBO ART VEN HEMORR LYMPH EXTRAV INC GUIDE ROADMAPPING; IR ULTRASOUND GUIDANCE VASC ACCESS RIGHT MEDICATIONS: 1%  lidocaine locally. ANESTHESIA/SEDATION: Patient is already ventilated and sedated. The patient's level of consciousness and vital signs were monitored continuously by radiology nursing throughout the procedure under my direct supervision. CONTRAST:  75cc Isovue 300 FLUOROSCOPY TIME:  Fluoroscopy Time: 5 minutes 30 seconds (1,687 mGy). COMPLICATIONS: None immediate. PROCEDURE: Informed consent was obtained from the patient family following explanation of the procedure, risks, benefits and alternatives. The patient understands, agrees and consents for the procedure. All questions were addressed. A time out was performed prior to the initiation of the procedure. Maximal barrier sterile technique utilized including caps, mask, sterile gowns, sterile gloves, large sterile drape, hand hygiene, and Betadine prep. Under sterile conditions and local anesthesia, ultrasound micropuncture access performed of the patent left common femoral artery. Five fr sheath inserted over a Bentson guidewire. C2 catheter advanced into the right renal artery. Catheter advanced into the main right renal artery over a Glidewire. Selective right renal angiogram performed. Right renal angiogram: Main right renal artery is patent. Anterior and posterior divisions are patent. Segmental branch to the lower pole demonstrates a small peripheral focal arterial blush / pseudo aneurysm compatible with an active bleeding site.  The right renal segmental and intraparenchymal branches demonstrate diffuse vaso constriction related to resuscitation. C2 catheter was advanced into the right renal lower pole segmental branch over a Glidewire. Selective angiogram performed of the right kidney lower pole branch. This is the dominant arterial branch feeding the peripheral right renal bleeding site. Embolization: From this location, 035 3 mm M rey Cook coils (2) and 035 6 mm Nester coils (2) were deployed within the right lower pole segmental branch. Post  embolization angiogram confirms complete occlusion of the right lower pole branch. Final right renal angiogram demonstrates no further active bleeding or other sites. Access removed. Left femoral sheet secured externally to an arterial flushed because of coagulopathy. IMPRESSION: Peripheral right kidney lower pole bleeding site localized. Segmental right lower pole feeding artery was catheterized with a 5 French C2 catheter for successful complete coil embolization as above. Electronically Signed   By: Jerilynn Mages.  Shick M.D.   On: 06/08/2015 10:25   US Renal Port  06/01/2015  CLINICAL DATA:  Acute onset of septic shock.  Initial encounter. EXAM: RENAL / URINARY TRACT ULTRASOUND COMPLETE COMPARISON:  CT of the abdomen and pelvis performed 03/15/2014, and renal ultrasound performed 03/06/2013 FINDINGS: Right Kidney: Length: 17.6 cm. Echogenicity within normal limits. Compensatory nephromegaly is noted. A small 1.9 cm cyst is noted at the upper pole of the right kidney. No hydronephrosis visualized. Left Kidney: Status post left-sided nephrectomy. Bladder: Decompressed and not well assessed. IMPRESSION: 1. No evidence of hydronephrosis. 2. Small right renal cyst noted. Electronically Signed   By: Garald Balding M.D.   On: 06/01/2015 01:42   Ir US Guide Vasc Access Right  06/08/2015  INDICATION: Large acute right perinephric hematoma, requiring cardiopulmonary resuscitation EXAM: IR RENAL SUPRASEL UNILATERAL S+I MODERATE SEDATION; ARTERIOGRAPHY; IR EMBO ART VEN HEMORR LYMPH EXTRAV INC GUIDE ROADMAPPING; IR ULTRASOUND GUIDANCE VASC ACCESS RIGHT MEDICATIONS: 1% lidocaine locally. ANESTHESIA/SEDATION: Patient is already ventilated and sedated. The patient's level of consciousness and vital signs were monitored continuously by radiology nursing throughout the procedure under my direct supervision. CONTRAST:  75cc Isovue 300 FLUOROSCOPY TIME:  Fluoroscopy Time: 5 minutes 30 seconds (1,687 mGy). COMPLICATIONS: None immediate.  PROCEDURE: Informed consent was obtained from the patient family following explanation of the procedure, risks, benefits and alternatives. The patient understands, agrees and consents for the procedure. All questions were addressed. A time out was performed prior to the initiation of the procedure. Maximal barrier sterile technique utilized including caps, mask, sterile gowns, sterile gloves, large sterile drape, hand hygiene, and Betadine prep. Under sterile conditions and local anesthesia, ultrasound micropuncture access performed of the patent left common femoral artery. Five fr sheath inserted over a Bentson guidewire. C2 catheter advanced into the right renal artery. Catheter advanced into the main right renal artery over a Glidewire. Selective right renal angiogram performed. Right renal angiogram: Main right renal artery is patent. Anterior and posterior divisions are patent. Segmental branch to the lower pole demonstrates a small peripheral focal arterial blush / pseudo aneurysm compatible with an active bleeding site. The right renal segmental and intraparenchymal branches demonstrate diffuse vaso constriction related to resuscitation. C2 catheter was advanced into the right renal lower pole segmental branch over a Glidewire. Selective angiogram performed of the right kidney lower pole branch. This is the dominant arterial branch feeding the peripheral right renal bleeding site. Embolization: From this location, 035 3 mm M rey Cook coils (2) and 035 6 mm Nester coils (2) were deployed within the right lower pole segmental branch.  Post embolization angiogram confirms complete occlusion of the right lower pole branch. Final right renal angiogram demonstrates no further active bleeding or other sites. Access removed. Left femoral sheet secured externally to an arterial flushed because of coagulopathy. IMPRESSION: Peripheral right kidney lower pole bleeding site localized. Segmental right lower pole feeding  artery was catheterized with a 5 French C2 catheter for successful complete coil embolization as above. Electronically Signed   By: Jerilynn Mages.  Shick M.D.   On: 06/08/2015 10:25   Dg Chest Port 1 View  06/16/2015  CLINICAL DATA:  Respiratory failure EXAM: PORTABLE CHEST 1 VIEW COMPARISON:  06/14/2015 FINDINGS: Endotracheal tube terminates 1 cm above the carina. Increased interstitial markings. Mild patchy left basilar opacity, likely atelectasis. Possible small left pleural effusion. Bilateral chest tubes.  No definite pneumothorax is seen. Right IJ venous catheter terminates in the mid SVC. Enteric tube courses below the diaphragm. IMPRESSION: Mild patchy left basilar opacity, likely atelectasis. Possible small left pleural effusion. Bilateral chest tubes.  No definite pneumothorax is seen. Endotracheal tube terminates 1 cm above the carina. Additional stable support apparatus as above. Electronically Signed   By: Julian Hy M.D.   On: 06/16/2015 11:23   Dg Chest Port 1 View  06/14/2015  CLINICAL DATA:  Acute respiratory failure, septic shock, acute renal failure, chronic atrial fibrillation. EXAM: PORTABLE CHEST 1 VIEW COMPARISON:  Portable chest x-ray of Jun 13, 2015 FINDINGS: The lungs are borderline hypoinflated. The interstitial markings remain increased bilaterally. Slight interval improvement in bibasilar atelectasis or pneumonia. Trace right pleural effusion. No pneumothorax. The cardiac silhouette remains enlarged and the pulmonary vascularity remains indistinct. The left hemidiaphragm remains obscured. The right hemidiaphragm is better demonstrated. The bilateral chest tubes are in stable position. The endotracheal tube tip lies 2.9 cm above the carina. The feeding tube tip projects below the inferior margin of the image. The right internal jugular venous catheter tip projects over the proximal SVC. IMPRESSION: Slight interval improvement in bibasilar atelectasis or pneumonia. Small left pleural  effusion is present. No pneumothorax. Stable cardiomegaly with mild central pulmonary vascular congestion. The support tubes are in stable position. Electronically Signed   By: David  Martinique M.D.   On: 06/14/2015 07:18   Dg Chest Port 1 View  06/13/2015  CLINICAL DATA:  Pneumothorax EXAM: PORTABLE CHEST 1 VIEW COMPARISON:  06/12/2015 FINDINGS: Bilateral chest tubes remain in place.  No pneumothorax. Endotracheal tube in good position. Right jugular central venous catheter tip in the right innominate vein unchanged. Bibasilar atelectasis left greater than right is unchanged. Bilateral subcutaneous emphysema appears improved. IMPRESSION: Endotracheal tube in good position.  No pneumothorax Bibasilar atelectasis unchanged. Electronically Signed   By: Franchot Gallo M.D.   On: 06/13/2015 07:33   Dg Chest Port 1 View  06/12/2015  CLINICAL DATA:  Acute respiratory failure. EXAM: PORTABLE CHEST 1 VIEW COMPARISON:  Chest radiograph 06/11/2015. FINDINGS: Right IJ central venous catheter tip projects in the superior vena cava. ETT terminates in the mid trachea. Bilateral chest tubes remain in place. Stable cardiomegaly. Low lung volumes. Stable retrocardiac opacities. No pneumothorax. Extensive chest wall emphysema. IMPRESSION: Stable support apparatus. Extensive chest wall emphysema. Atelectasis or infection left lung base. Electronically Signed   By: Lovey Newcomer M.D.   On: 06/12/2015 08:01   Dg Chest Port 1 View  06/11/2015  CLINICAL DATA:  Pneumothorax after CPR. EXAM: PORTABLE CHEST 1 VIEW COMPARISON:  Yesterday FINDINGS: Endotracheal tube tip is between the clavicular heads and carina. Right IJ central line with  tip near the SVC origin. Bilateral chest tubes in stable position. No convincing pneumothorax. Left-sided rib fractures without gross increase in displacement. Dense opacity behind the heart with air bronchograms. Diffuse chest wall emphysema which is decreasing. IMPRESSION: 1. Stable positioning of tubes  and central line. 2. No visible pneumothorax. Extensive chest wall emphysema is gradually decreasing. 3. Retrocardiac atelectasis or pneumonia. Electronically Signed   By: Monte Fantasia M.D.   On: 06/11/2015 07:15   Dg Chest Portable 1 View  06/10/2015  CLINICAL DATA:  Acute respiratory failure, septic shock. EXAM: PORTABLE CHEST 1 VIEW COMPARISON:  Portable chest x-ray of Jun 09, 2015 FINDINGS: The lungs are slightly less well inflated today. Extensive subcutaneous emphysema persists bilaterally. Classic pleural lines of pneumothoraces are not evident. There may be pleural space air projecting over the lateral aspect of the left third rib however. On the right and no pneumothorax is evident. The cardiac silhouette remains enlarged. The pulmonary vascularity is engorged but slightly improved in appearance overall. The endotracheal tube tip lies approximately 4 cm above the carina. The esophagogastric tube tip projects in the distal third of the esophagus. The large caliber right internal jugular venous catheter tip projects over the proximal SVC. The left-sided chest tube tip projects over the posterior medial aspect of the fourth rib. IMPRESSION: 1. Probable small left apical pneumothorax but a classic pleural line is not observed. The left-sided chest tube is in stable position. The subcutaneous emphysema persists but is slightly less conspicuous overall. No right-sided pneumothorax. A right chest tube is in stable position with the tip overlying the posterior- medial aspect of the right seventh rib. 2. Congestive heart failure with slight decrease in pulmonary edema and vascular congestion. 3. Persistent unsatisfactory positioning of the esophagogastric tube with the tip and proximal port lying in the distal and mid esophagus respectively. 4. Endotracheal tube, bilateral chest tubes, and right internal jugular venous catheters in appropriate position. Electronically Signed   By: David  Martinique M.D.   On:  06/10/2015 07:10   Dg Chest Port 1 View  06/09/2015  CLINICAL DATA:  Chest tube in place. Right-sided chest tube was incidentally removed and replaced. Check placement. EXAM: PORTABLE CHEST 1 VIEW COMPARISON:  06/09/2015 FINDINGS: Endotracheal tube with tip measuring 4.1 cm above the carina. The enteric tube tip is positioned over the middle mediastinum consistent with location in the mid esophagus. Bilateral chest tubes appear to be in place. Right central venous catheter with tip over the upper SVC region. Extensive subcutaneous emphysema is demonstrated throughout the chest and in the neck. No definite pneumothorax although subcutaneous emphysema could obscure visualization of the small pneumothorax. Lungs appear mostly expanded. Atelectasis in the lung bases. Mild cardiac enlargement. Shallow inspiration. IMPRESSION: Appliances positioned as described with enteric tube tip likely in the mid esophagus. Cardiac enlargement. Atelectasis in the lung bases. Diffuse subcutaneous emphysema. Electronically Signed   By: Lucienne Capers M.D.   On: 06/09/2015 21:12   Dg Chest Port 1 View  06/09/2015  CLINICAL DATA:  Nasogastric tube placement EXAM: PORTABLE CHEST 1 VIEW COMPARISON:  Study obtained earlier in the day FINDINGS: The nasogastric tube tip is in the distal esophagus with the side port in the mid esophageal region. Endotracheal tube tip is 2.8 cm above the carina. Central catheter tip is in the superior vena cava. Chest tube on the left is again noted. There is a rather minimal lateral pneumothorax on the left without tension component. Extensive subcutaneous air bilaterally is again noted. There  is patchy bibasilar atelectasis. No new parenchymal lung opacity is seen. Heart is borderline enlarged with pulmonary vascularity within normal limits. No adenopathy evident. IMPRESSION: Nasogastric tube with tip in distal esophagus. Advise advancing nasogastric tube approximately 20 cm to confirm that the  nasogastric tube tip and side port are well within the stomach. Other tubes and catheters as described. Minimal lateral pneumothorax on the left without tension component. Extensive subcutaneous air remains diffusely. There is patchy atelectasis bilaterally without new opacity. No change in cardiac silhouette. These results will be called to the ordering clinician or representative by the Radiologist Assistant, and communication documented in the PACS or zVision Dashboard. Electronically Signed   By: Lowella Grip III M.D.   On: 06/09/2015 13:19   Dg Chest Port 1 View  06/09/2015  CLINICAL DATA:  Shortness of breath.  Chest tubes. EXAM: PORTABLE CHEST 1 VIEW COMPARISON:  06/08/2015. FINDINGS: Endotracheal tube, right IJ sheath, bilateral chest tubes in stable position. No pneumothorax identified. Extensive but improving diffuse neck and chest wall subcutaneous emphysema again noted. Stable cardiomegaly. Persistent bilateral airspace disease. No interim change. IMPRESSION: 1. Lines and tubes including bilateral chest tubes in stable position. No pneumothorax. Extensive but improving diffuse chest wall and neck subcutaneous emphysema. 2. Persistent unchanged bilateral airspace disease. Stable cardiomegaly. Electronically Signed   By: Marcello Moores  Register   On: 06/09/2015 07:37   Dg Chest Port 1 View  06/08/2015  CLINICAL DATA:  Post chest tube insertion EXAM: PORTABLE CHEST 1 VIEW COMPARISON:  06/08/2015 dated 1453 hours FINDINGS: Extensive subcutaneous emphysema overlying the bilateral chest and along the lateral chest wall/axilla. Indwelling bilateral chest tubes.  No definite pneumothorax is seen. Mild patchy opacity at the left lung base, likely compressive atelectasis. Endotracheal tube terminates 3 cm above the carina. Cardiomegaly. Right IJ venous catheter terminates in the upper SVC. IMPRESSION: Indwelling bilateral chest tubes.  No definite pneumothorax is seen. Extensive subcutaneous emphysema.  Endotracheal tube terminates 3 cm above the carina. Electronically Signed   By: Julian Hy M.D.   On: 06/08/2015 17:20   Dg Chest Port 1 View  06/08/2015  CLINICAL DATA:  75 year old who underwent transcatheter embolization of a right lower pole renal artery due to acute hemorrhage. Ventilator dependent respiratory failure. Followup subcutaneous emphysema. EXAM: PORTABLE CHEST 1 VIEW 2:53 p.m.: COMPARISON:  Portable chest x-ray earlier today 8:11 a.m. and previously. FINDINGS: Since examination earlier today, marked progression of now severe diffuse subcutaneous emphysema throughout the chest wall and neck. Endotracheal tube tip remains in satisfactory position projecting approximately 3 cm above the carina. Right subclavian central venous catheter tip projects over the upper SVC, unchanged. External pacing pads. Cardiac silhouette mildly to moderately enlarged, unchanged. Interval development of patchy opacities involving the lung bases, right greater than left, since earlier today. Pulmonary vascularity normal. IMPRESSION: 1. Marked progression of now severe diffuse subcutaneous emphysema throughout the chest wall and neck. 2. Support apparatus satisfactory. 3. Developing atelectasis and/or pneumonia involving the lung bases, right greater than left. 4. Stable cardiomegaly without pulmonary edema. Electronically Signed   By: Evangeline Dakin M.D.   On: 06/08/2015 15:11   Portable Chest Xray  06/08/2015  CLINICAL DATA:  Hypoxia EXAM: PORTABLE CHEST 1 VIEW COMPARISON:  June 02, 2015 FINDINGS: Endotracheal tube tip is 2.1 cm above the carina. Right central catheter tip is in the superior vena cava. No pneumothorax is evident. However, there is extensive soft tissue air in the left lateral hemithorax region. There is interstitial and patchy alveolar edema  throughout the lungs bilaterally, slightly increased. There is cardiomegaly with mild pulmonary venous hypertension. No adenopathy evident. IMPRESSION:  Tube and catheter positions as described. There is extensive soft tissue air in the left chest without pneumothorax appreciable on this supine examination. Evidence of underlying congestive heart failure. Superimposed bibasilar pneumonia cannot be excluded radiographically. Electronically Signed   By: Lowella Grip III M.D.   On: 06/08/2015 08:30   Dg Chest Port 1 View  06/02/2015  CLINICAL DATA:  Dyspnea EXAM: PORTABLE CHEST 1 VIEW COMPARISON:  Yesterday FINDINGS: Borderline cardiomegaly. Stable right jugular central venous catheter with its tip in the mid SVC. Bibasilar hazy opacity is unchanged. Upper lungs clear. No pneumothorax. IMPRESSION: Stable bibasilar atelectasis versus airspace disease. Electronically Signed   By: Marybelle Killings M.D.   On: 06/02/2015 08:01   Dg Chest Port 1 View  06/01/2015  CLINICAL DATA:  Central line placement. EXAM: PORTABLE CHEST 1 VIEW COMPARISON:  05/31/2015. FINDINGS: 1430 hours. New right IJ central venous catheter extends to the mid SVC level. There are persistent low lung volumes with mildly increased bibasilar atelectasis. No definite edema, pleural effusion or pneumothorax. The heart size and mediastinal contours are stable. IMPRESSION: Interval central line placement as described without complicating pneumothorax. Mildly increased bibasilar atelectasis. Electronically Signed   By: Richardean Sale M.D.   On: 06/01/2015 14:42   Dg Abd Portable 1v  06/12/2015  CLINICAL DATA:  Evaluate NG tube. EXAM: PORTABLE ABDOMEN - 1 VIEW COMPARISON:  Film from earlier today FINDINGS: The NG tube takes an unusual course. However, based on previous CT imaging, I suspect the tube probably terminates in the hiatal hernia with the distal tip flipped back on itself. Air in the subcutaneous tissues of the left chest. The ET tube remains in place. IMPRESSION: The distal tip of the NG tube appears to be within the hiatal hernia with the distal tip flipped back on itself. Findings called  to the patient's nurse. Electronically Signed   By: Dorise Bullion III M.D   On: 06/12/2015 13:40   Dg Abd Portable 1v  06/12/2015  CLINICAL DATA:  Evaluate NG tube placement. EXAM: PORTABLE ABDOMEN - 1 VIEW COMPARISON:  Jun 12, 2015 FINDINGS: The NG tube still terminates in the region of left mainstem bronchus. No other interval changes. Air is seen in the subcutaneous tissues of the chest, right greater than left. IMPRESSION: The NG tube appears to terminate in the left mainstem bronchus. This finding was called to the patient's nurse. Per report, the tube has already been removed. Electronically Signed   By: Dorise Bullion III M.D   On: 06/12/2015 13:20   Dg Abd Portable 1v  06/12/2015  CLINICAL DATA:  Evaluate for feeding tube placement. EXAM: PORTABLE ABDOMEN - 1 VIEW COMPARISON:  Abdominal radiograph 06/09/2015 FINDINGS: The distal aspect of the enteric tube projects over the mid esophagus. Surgical clips within the mid abdomen. Nonobstructed bowel gas pattern. IMPRESSION: The enteric tube is located within the midesophagus. This needs to be advanced. These results will be called to the ordering clinician or representative by the Radiologist Assistant, and communication documented in the PACS or zVision Dashboard. Electronically Signed   By: Lovey Newcomer M.D.   On: 06/12/2015 11:16   Dg Abd Portable 1v  06/09/2015  CLINICAL DATA:  Status post nasogastric catheter placement EXAM: PORTABLE ABDOMEN - 1 VIEW COMPARISON:  None. FINDINGS: Scattered large and small bowel gas is noted. The nasogastric catheter is again seen in the distal esophagus  stable from the recent chest x-ray. Significant subcutaneous emphysema is noted. Changes of recent renal embolization on the right are again seen. IMPRESSION: Nasogastric catheter within the distal esophagus. Electronically Signed   By: Inez Catalina M.D.   On: 06/09/2015 14:51   Dg Abd Portable 1v  06/09/2015  CLINICAL DATA:  Check feeding tube placement EXAM:  PORTABLE ABDOMEN - 1 VIEW COMPARISON:  None. FINDINGS: Scattered large and small bowel gas is noted. Subcutaneous emphysema is noted similar to that seen on recent chest x-ray. A feeding catheter is not identified at this time. Changes of recent renal embolization on the right are seen. No new focal abnormality is noted per IMPRESSION: No feeding catheter is identified. Subcutaneous emphysema. Electronically Signed   By: Inez Catalina M.D.   On: 06/09/2015 13:09   Dg [place]ison Bailey G Tube Plc W/fl-no Rad  06/13/2015  CLINICAL DATA:  NASO G TUBE PLACEMENT WITH FLUORO Fluoroscopy was utilized by the requesting physician.  No radiographic interpretation.   [place] Guide Roadmapping  06/08/2015  INDICATION: Large acute right perinephric hematoma, requiring cardiopulmonary resuscitation EXAM: IR RENAL SUPRASEL UNILATERAL S+I MODERATE SEDATION; ARTERIOGRAPHY; IR EMBO ART VEN HEMORR LYMPH EXTRAV INC GUIDE ROADMAPPING; IR ULTRASOUND GUIDANCE VASC ACCESS RIGHT MEDICATIONS: 1% lidocaine locally. ANESTHESIA/SEDATION: Patient is already ventilated and sedated. The patient's level of consciousness and vital signs were monitored continuously by radiology nursing throughout the procedure under my direct supervision. CONTRAST:  75cc Isovue 300 FLUOROSCOPY TIME:  Fluoroscopy Time: 5 minutes 30 seconds (1,687 mGy). COMPLICATIONS: None immediate. PROCEDURE: Informed consent was obtained from the patient family following explanation of the procedure, risks, benefits and alternatives. The patient understands, agrees and consents for the procedure. All questions were addressed. A time out was performed prior to the initiation of the procedure. Maximal barrier sterile technique utilized including caps, mask, sterile gowns, sterile gloves, large sterile drape, hand hygiene, and Betadine prep. Under sterile conditions and local anesthesia, ultrasound micropuncture access performed of the patent left common femoral  artery. Five fr sheath inserted over a Bentson guidewire. C2 catheter advanced into the right renal artery. Catheter advanced into the main right renal artery over a Glidewire. Selective right renal angiogram performed. Right renal angiogram: Main right renal artery is patent. Anterior and posterior divisions are patent. Segmental branch to the lower pole demonstrates a small peripheral focal arterial blush / pseudo aneurysm compatible with an active bleeding site. The right renal segmental and intraparenchymal branches demonstrate diffuse vaso constriction related to resuscitation. C2 catheter was advanced into the right renal lower pole segmental branch over a Glidewire. Selective angiogram performed of the right kidney lower pole branch. This is the dominant arterial branch feeding the peripheral right renal bleeding site. Embolization: From this location, 035 3 mm M rey Cook coils (2) and 035 6 mm Nester coils (2) were deployed within the right lower pole segmental branch. Post embolization angiogram confirms complete occlusion of the right lower pole branch. Final right renal angiogram demonstrates no further active bleeding or other sites. Access removed. Left femoral sheet secured externally to an arterial flushed because of coagulopathy. IMPRESSION: Peripheral right kidney lower pole bleeding site localized. Segmental right lower pole feeding artery was catheterized with a 5 French C2 catheter for successful complete coil embolization as above. Electronically Signed   By: Jerilynn Mages.  Shick M.D.   On: 06/08/2015 10:25    Labs:  CBC:  Recent Labs  06/14/15 1902 06/15/15 0414 06/15/15 1630  06/16/15 0500  WBC 3.5* 2.8* 4.2 4.4  HGB 7.5* 7.1* 8.3* 8.0*  HCT 23.9* 22.9* 26.3* 25.3*  PLT 41* 36* 44* 46*    COAGS:  Recent Labs  06/06/15 0210 06/08/15 0615 06/08/15 1023 06/08/15 1954  06/13/15 1315 06/14/15 0417 06/14/15 1902 06/15/15 0414  INR 2.04* 2.80* 3.43* 2.77*  < > 1.79* 2.03* 1.75*  1.58*  APTT 31 35 47* 33  --   --   --   --   --   < > = values in this interval not displayed.  BMP:  Recent Labs  06/14/15 1654 06/15/15 0414 06/15/15 1630 06/16/15 0500  NA 139 141 140 139  K 4.3 4.2 4.2 4.0  CL 106 105 106 104  CO2 24 25 25 24   GLUCOSE 110* 133* 153* 137*  BUN 16 18 18 20   CALCIUM 8.1* 8.4* 8.4* 8.2*  CREATININE 1.59* 1.49* 1.48* 1.48*  GFRNONAA 41* 45* 45* 45*  GFRAA 48* 52* 52* 52*    LIVER FUNCTION TESTS:  Recent Labs  04/29/15 1545 06/01/15 1043  06/06/15 0210  06/09/15 1128  06/13/15 0418  06/14/15 1654 06/15/15 0414 06/15/15 1630 06/16/15 0500  BILITOT 0.7 1.5*  --  0.6  --  2.2*  --  2.0*  --   --   --   --   --   AST 26  --   --  18  --  1199*  --  105*  --   --   --   --   --   ALT 20  --   --  15*  --  527*  --  178*  --   --   --   --   --   ALKPHOS 29*  --   --  64  --  107  --  129*  --   --   --   --   --   PROT 6.4*  --   --  5.2*  --  4.6*  --  4.6*  --   --   --   --   --   ALBUMIN 3.6  --   < > 1.9*  1.8*  < > 2.2*  < > 1.8*  1.8*  < > 2.7* 3.0* 2.9* 2.6*  < > = values in this interval not displayed.  TUMOR MARKERS: No results for input(s): AFPTM, CEA, CA199, CHROMGRNA in the last 8760 hours.  Assessment and Plan:  Ulikely renal recovery per Nephrology AKI/CKD Existing Rt IJ trialysis---CVVHD Scheduled for tunneled HD catheter in IR 5/12 Risks and Benefits discussed with the patient's wife including, but not limited to bleeding, infection, vascular injury, pneumothorax which may require chest tube placement, air embolism or even death All of her questions were answered, she is agreeable to proceed. Consent signed and in chart.   Thank you for this interesting consult.  I greatly enjoyed meeting [name] and look forward to participating in their care.  A copy of this report was sent to the requesting provider on this date.  Electronically Signed: Sherrol Vicars A 06/16/2015, 11:41 AM   I spent a total of 40  Minutes    in face to face in clinical consultation, greater than 50% of which was counseling/coordinating care for tunneled HD catheter placement

## 2015-06-16 NOTE — Progress Notes (Signed)
CSW met briefly with pt's wife on the medical unit re: pt's FMLA paperwork.  Paperwork faxed to [place] records per the direction of the receptionist at the [facility] office.  Pt's wife informed.  CSW will continue to follow and assist pt/family as necessary.  Creta Levin, LCSW Evening/ED Coverage [phone]

## 2015-06-17 ENCOUNTER — Inpatient Hospital Stay (HOSPITAL_COMMUNITY): Payer: BLUE CROSS/BLUE SHIELD

## 2015-06-17 LAB — GLUCOSE, CAPILLARY
GLUCOSE-CAPILLARY: 111 mg/dL — AB (ref 65–99)
GLUCOSE-CAPILLARY: 123 mg/dL — AB (ref 65–99)
GLUCOSE-CAPILLARY: 127 mg/dL — AB (ref 65–99)
GLUCOSE-CAPILLARY: 135 mg/dL — AB (ref 65–99)
GLUCOSE-CAPILLARY: 157 mg/dL — AB (ref 65–99)
GLUCOSE-CAPILLARY: 161 mg/dL — AB (ref 65–99)

## 2015-06-17 LAB — RENAL FUNCTION PANEL
ANION GAP: 10 (ref 5–15)
Albumin: 2.6 g/dL — ABNORMAL LOW (ref 3.5–5.0)
Albumin: 2.4 g/dL — ABNORMAL LOW (ref 3.5–5.0)
Anion gap: 10 (ref 5–15)
BUN: 24 mg/dL — AB (ref 6–20)
BUN: 28 mg/dL — ABNORMAL HIGH (ref 6–20)
CHLORIDE: 103 mmol/L (ref 101–111)
CHLORIDE: 104 mmol/L (ref 101–111)
CO2: 27 mmol/L (ref 22–32)
CO2: 25 mmol/L (ref 22–32)
CREATININE: 1.45 mg/dL — AB (ref 0.61–1.24)
CREATININE: 1.65 mg/dL — AB (ref 0.61–1.24)
Calcium: 8.4 mg/dL — ABNORMAL LOW (ref 8.9–10.3)
Calcium: 8.2 mg/dL — ABNORMAL LOW (ref 8.9–10.3)
GFR calc Af Amer: 54 mL/min — ABNORMAL LOW (ref >60)
GFR, EST AFRICAN AMERICAN: 46 mL/min — AB (ref >60)
GFR, EST NON AFRICAN AMERICAN: 46 mL/min — AB (ref >60)
GFR, EST NON AFRICAN AMERICAN: 40 mL/min — AB (ref >60)
GLUCOSE: 133 mg/dL — AB (ref 65–99)
Glucose, Bld: 191 mg/dL — ABNORMAL HIGH (ref 65–99)
POTASSIUM: 4.1 mmol/L (ref 3.5–5.1)
POTASSIUM: 4.3 mmol/L (ref 3.5–5.1)
Phosphorus: 2.3 mg/dL — ABNORMAL LOW (ref 2.5–4.6)
Phosphorus: 2.3 mg/dL — ABNORMAL LOW (ref 2.5–4.6)
Sodium: 140 mmol/L (ref 135–145)
Sodium: 139 mmol/L (ref 135–145)

## 2015-06-17 LAB — CBC
HCT: 27.1 % — ABNORMAL LOW (ref 39.0–52.0)
HEMOGLOBIN: 8.3 g/dL — AB (ref 13.0–17.0)
MCH: 27.9 pg (ref 26.0–34.0)
MCHC: 30.6 g/dL (ref 30.0–36.0)
MCV: 90.9 fL (ref 78.0–100.0)
Platelets: 55 10*3/uL — ABNORMAL LOW (ref 150–400)
RBC: 2.98 MIL/uL — ABNORMAL LOW (ref 4.22–5.81)
RDW: 18.2 % — AB (ref 11.5–15.5)
WBC: 5.4 10*3/uL (ref 4.0–10.5)

## 2015-06-17 LAB — PROTIME-INR
INR: 1.57 — AB (ref 0.00–1.49)
Prothrombin Time: 18.8 seconds — ABNORMAL HIGH (ref 11.6–15.2)

## 2015-06-17 LAB — MAGNESIUM: MAGNESIUM: 2.2 mg/dL (ref 1.7–2.4)

## 2015-06-17 MED ORDER — HEPARIN SODIUM (PORCINE) 1000 UNIT/ML IJ SOLN
INTRAMUSCULAR | Status: AC
  Administered 2015-06-17: 3200 [IU] via INTRAVENOUS
  Filled 2015-06-17: qty 1

## 2015-06-17 MED ORDER — CEFAZOLIN SODIUM 1-5 GM-% IV SOLN
INTRAVENOUS | Status: AC
  Filled 2015-06-17: qty 50

## 2015-06-17 MED ORDER — LIDOCAINE HCL 1 % IJ SOLN
INTRAMUSCULAR | Status: AC
  Administered 2015-06-17: 15 mL
  Filled 2015-06-17: qty 20

## 2015-06-17 MED ORDER — DEXTROSE 5 % IV SOLN
3.0000 g | INTRAVENOUS | Status: AC
  Administered 2015-06-17: 3 g via INTRAVENOUS
  Filled 2015-06-17: qty 3000

## 2015-06-17 MED ORDER — CEFAZOLIN SODIUM-DEXTROSE 2-4 GM/100ML-% IV SOLN
INTRAVENOUS | Status: AC
  Filled 2015-06-17: qty 100

## 2015-06-17 NOTE — Sedation Documentation (Signed)
Pt adequately sedated on fentanyl drip.

## 2015-06-17 NOTE — Progress Notes (Signed)
[facility] Rounding Note  Subjective/Interval Events: Sedated. Does not follow commands.   Soft BPs yesterday afternoon but otherwise tolerating -150cc/hr off with CVVHD Will have tunneled catheter placed today  Objective: Vital signs in last 24 hours: Temp:  [96.3 F (35.7 C)-97.6 F (36.4 C)] 97.6 F (36.4 C) (05/12 0311) Pulse Rate:  [52-112] 69 (05/12 0630) Resp:  [15-36] 20 (05/12 0630) BP: (66-149)/(32-105) 123/75 mmHg (05/12 0630) SpO2:  [92 %-100 %] 100 % (05/12 0630) Arterial Line BP: (80-144)/(42-82) 80/42 mmHg (05/11 1000) FiO2 (%):  [30 %] 30 % (05/12 0600) Weight:  [298 lb 11.6 oz (135.5 kg)] 298 lb 11.6 oz (135.5 kg) (05/12 0416) Weight change: -2 lb 6.8 oz (-1.1 kg)  Filed Weights   06/15/15 0500 06/16/15 0337 06/17/15 0416  Weight: 313 lb 15 oz (142.4 kg) 301 lb 2.4 oz (136.6 kg) 298 lb 11.6 oz (135.5 kg)   Intake/Output from previous day: 05/11 0701 - 05/12 0700 In: 2256.2 [I.V.:784.2; NG/GT:890; IV Piggyback:582] Out: 5300 [Urine:30] Intake/Output this shift:   BP 123/75 mmHg  Pulse 69  Temp(Src) 97.6 F (36.4 C) (Oral)  Resp 20  Ht 5' 9" (1.753 m)  Wt 298 lb 11.6 oz (135.5 kg)  BMI 44.09 kg/m2  SpO2 100%  General appearance: Sedated and intubated. Anasarca Resp: intubated Coarse breath sounds bilaterally, chest tubes in places bilaterally, subq emphysema on chest wall noted, improving Cardio: RRR with occasional ectopy GI: S, NT, ND Ext: 3+ pitting edema in LE, improving though still very significant Neuro: Sedated, Does not follow commands.  Small amount reddish brown urine in foley bag  Lab Results:  Recent Labs  06/16/15 0500 06/17/15 0507  WBC 4.4 5.4  HGB 8.0* 8.3*  HCT 25.3* 27.1*  PLT 46* 55*     Recent Labs  06/16/15 1530 06/17/15 0508  NA 140 140  K 3.9 4.1  CL 104 103  CO2 27 27  GLUCOSE 158* 133*  BUN 23* 24*  CREATININE 1.46* 1.45*  CALCIUM 8.3* 8.4*   PHOSPHORUS  Date/Time Value Ref Range Status  06/17/2015 05:08  AM 2.3* 2.5 - 4.6 mg/dL Final  06/16/2015 03:30 PM 2.0* 2.5 - 4.6 mg/dL Final  06/16/2015 05:00 AM 1.6* 2.5 - 4.6 mg/dL Final    Lab Results  Component Value Date   INR 1.57* 06/17/2015   INR 1.58* 06/15/2015   INR 1.75* 06/14/2015    Studies/Results: Dg Chest Port 1 View  06/17/2015  CLINICAL DATA:  Acute hypoxemic respiratory failure, septic shock, chest tube treatment. EXAM: PORTABLE CHEST 1 VIEW COMPARISON:  Portable chest x-ray of Jun 16, 2015 FINDINGS: The lungs are mildly hypoinflated. There are persistent interstitial densities in the left perihilar and infrahilar regions and in the right in infrahilar region. There is no pneumothorax or large pleural effusion. The cardiac silhouette remains enlarged. The pulmonary vascularity is not engorged. Bilateral chest tubes are in stable position. The endotracheal tube tip lies 3 is 0.9 cm above the carina. The feeding tube tip projects below the inferior margin of the image. The dual-lumen right internal jugular venous catheter tip projects over the proximal SVC. IMPRESSION: Slight overall improvement in the appearance of the pulmonary interstitium. Persistent atelectasis or infiltrate bilaterally. There is no pneumothorax or significant pleural effusion. The support tubes are in stable position. Electronically Signed   By: David  Martinique M.D.   On: 06/17/2015 07:10   Dg Chest Port 1 View  06/16/2015  CLINICAL DATA:  Respiratory failure EXAM: PORTABLE CHEST 1 VIEW  COMPARISON:  06/14/2015 FINDINGS: Endotracheal tube terminates 1 cm above the carina. Increased interstitial markings. Mild patchy left basilar opacity, likely atelectasis. Possible small left pleural effusion. Bilateral chest tubes.  No definite pneumothorax is seen. Right IJ venous catheter terminates in the mid SVC. Enteric tube courses below the diaphragm. IMPRESSION: Mild patchy left basilar opacity, likely atelectasis. Possible small left pleural effusion. Bilateral chest tubes.  No  definite pneumothorax is seen. Endotracheal tube terminates 1 cm above the carina. Additional stable support apparatus as above. Electronically Signed   By: Julian Hy M.D.   On: 06/16/2015 11:23    Scheduled Medications: . sodium chloride   Intravenous Once  . sodium chloride   Intravenous Once  . sodium chloride   Intravenous Once  . sodium chloride   Intravenous Once  . sodium chloride   Intravenous Once  . antiseptic oral rinse  7 mL Mouth Rinse QID  . chlorhexidine gluconate (SAGE KIT)  15 mL Mouth Rinse BID  . sennosides  5 mL Oral BID   And  . docusate  100 mg Oral BID  . feeding supplement (PRO-STAT SUGAR FREE 64)  30 mL Per Tube TID  . fentaNYL (SUBLIMAZE) injection  50 mcg Intravenous Once  . insulin aspart  0-15 Units Subcutaneous Q4H  . pantoprazole sodium  40 mg Per Tube Q1200  . sodium chloride flush  10-40 mL Intracatheter Q12H  . sodium phosphate  Dextrose 5% IVPB  10 mmol Intravenous BID   . sodium chloride 10 mL/hr at 06/14/15 0600  . dexmedetomidine 0.2 mcg/kg/hr (06/16/15 0900)  . feeding supplement (VITAL HIGH PROTEIN) Stopped (06/17/15 0000)  . fentaNYL infusion INTRAVENOUS 200 mcg/hr (06/17/15 0100)  . norepinephrine (LEVOPHED) Adult infusion Stopped (06/14/15 1200)  . dialysis replacement fluid (prismasate) 300 mL/hr at 06/17/15 0239  . dialysis replacement fluid (prismasate) 400 mL/hr at 06/17/15 0239  . dialysate (PRISMASATE) 2,000 mL/hr at 06/17/15 7672    Assessment/ Plan: Pt is a 75 y.o. yo male who was admitted on 05/31/2015 with septic shock 2/2 klebsiella bacteremia. Readmitted to the ICU 06/08/2015 with hemorrhagic shock in the setting of a right perinephric hematoma. Now s/p coil embolization.  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 Patient's Cr was notably elevated at 9.33, his baseline is thought to be around 1.7-1.9. Now  has bilat chest tubes r/t significant sub-q emphysema post CPR.  1. AKI/CKD, patient s/p left nephrectomy and now s/p partial right renal artery coil embolization 2/2 perinephric hematoma. Anuric. Doubtful that patient will have any significant renal recovery. Will likely be dialysis dependent moving forward.   - On CVVHD - Continue drawing volume as tolerated (Goal -126m/hr)  - Giving colloid replacement via FFP  - Giving phos repletion 155ml IV bid   - To have tunneled catheter placed today via IR 2. Hemorrhagic shock 2/2 Acute blood loss anemia 2/2 right perinephric hematoma. Also with Coagulopathy with thrombocytopenia, elevated INR. S/p coil embolization by IR. s/p FFP and vit K. BPs now stable off pressors.  - Has been getting FFP and pRBC per CCM - this will also help increase intravascular oncotic pressure  - Not getting heparin.   - received 1 unit PRBC's 5/10 Hb now about 8 3. Urosepsis / Positive blood culture pan-sensitive Kleb Pneumo. Completed course of abx. Per CCM.   CaDimas Chyle/01/2016,7:35 AM   I have seen and examined this patient and  agree with plan and assessment in the above note. Has had TDC placed R IJ. Resume CRRT. Pull fluid as BP allows. K fine. On BID phos replacement.  DUNHAM,CYNTHIA B,MD 06/17/2015 12:00 PM

## 2015-06-17 NOTE — Progress Notes (Signed)
PULMONARY / CRITICAL CARE MEDICINE   Name: [name] MRN: [id number] DOB: 1941-01-11    ADMISSION DATE:  05/31/2015 CONSULTATION DATE:  4/25  REFERRING MD:  EDP  CHIEF COMPLAINT:  Hypotension   HISTORY OF PRESENT ILLNESS:   75yo male with hx PE, OSA, CKD III (L nephrectomy r/t RCC), DM initially admitted 4/25 with sepsis r/t UTI.  Admitted by PCCM, requiring vasopressors.  He improved slowly and was tx out to med-surg on 4/30.  On 5/2 developed R flank pain and CT abd/pelvis revealed increased R perinephric hematoma worsened by heparin gtt and pt developed worsening hypotension requiring tx back to ICU and vasopressors.  Pt continued to deteriorate overnight and ultimately had cardiac arrest am 5/3 with approx 12 mins CPR.  Multiple blood products given.  Pt taken to IR for embolization of R perinephric hematoma once stabilized.  Course now c/b AKI on CKD requiring CVVHD, bilat chest tubes r/t significant sub-q emphysema post CPR.    SUBJECTIVE:  Back from Ir procedure afebrile Agitated on WUA, on fent gtt higher dose for procedure    VITAL SIGNS: BP 85/65 mmHg  Pulse 108  Temp(Src) 97.4 F (36.3 C) (Oral)  Resp 18  Ht 5\' 9"  (1.753 m)  Wt 298 lb 11.6 oz (135.5 kg)  BMI 44.09 kg/m2  SpO2 100%  HEMODYNAMICS:    VENTILATOR SETTINGS: Vent Mode:  [-] PRVC FiO2 (%):  [30 %] 30 % Set Rate:  [16 bmp] 16 bmp Vt Set:  [570 mL] 570 mL PEEP:  [5 cmH20] 5 cmH20 Pressure Support:  [10 cmH20] 10 cmH20 Plateau Pressure:  [18 cmH20-23 cmH20] 19 cmH20  INTAKE / OUTPUT: I/O last 3 completed shifts: In: 3117.9 [I.V.:1175.9; NG/GT:1360; IV Piggyback:582] Out: [id number] [Urine:70; LJ:[id number]; Chest Tube:70]  PHYSICAL EXAMINATION: General:  Chronically ill appearing male, acutely ill  Neuro:  Sedated, RASS -1, int FC with agitattion HEENT:  Mm moist, no JVD, ETT  Cardiovascular:  s1s2 rr not tachy Lungs:  Coarse, sub q crepitus on chest, NO leaks on chest tube Abdomen: distended,  hypoactive bs, no r/g Musculoskeletal:  Warm and dry, 2+ BLE edema , ana sarca  LABS:  BMET  Recent Labs Lab 06/16/15 0500 06/16/15 1530 06/17/15 0508  NA 139 140 140  K 4.0 3.9 4.1  CL 104 104 103  CO2 24 27 27   BUN 20 23* 24*  CREATININE 1.48* 1.46* 1.45*  GLUCOSE 137* 158* 133*    Electrolytes  Recent Labs Lab 06/15/15 0414  06/16/15 0500 06/16/15 1530 06/17/15 0507 06/17/15 0508  CALCIUM 8.4*  < > 8.2* 8.3*  --  8.4*  MG 2.3  --  2.2  --  2.2  --   PHOS 1.8*  < > 1.6* 2.0*  --  2.3*  < > = values in this interval not displayed.  CBC  Recent Labs Lab 06/15/15 1630 06/16/15 0500 06/17/15 0507  WBC 4.2 4.4 5.4  HGB 8.3* 8.0* 8.3*  HCT 26.3* 25.3* 27.1*  PLT 44* 46* 55*    Coag's  Recent Labs Lab 06/14/15 1902 06/15/15 0414 06/17/15 0507  INR 1.75* 1.58* 1.57*    Sepsis Markers  Recent Labs Lab 06/11/15 0518  PROCALCITON 12.01    ABG  Recent Labs Lab 06/12/15 0535 06/16/15 0945  PHART 7.310* 7.372  PCO2ART 43.7 43.8  PO2ART 114* 142.0*    Liver Enzymes  Recent Labs Lab 06/13/15 0418  06/16/15 0500 06/16/15 1530 06/17/15 0508  AST 105*  --   --   --   --  ALT 178*  --   --   --   --   ALKPHOS 129*  --   --   --   --   BILITOT 2.0*  --   --   --   --   ALBUMIN 1.8*  1.8*  < > 2.6* 2.6* 2.6*  < > = values in this interval not displayed.  Cardiac Enzymes No results for input(s): TROPONINI, PROBNP in the last 168 hours.  Glucose  Recent Labs Lab 06/16/15 1131 06/16/15 1540 06/16/15 2004 06/17/15 0049 06/17/15 0314 06/17/15 0757  GLUCAP 115* 142* 101* 135* 111* 123*    Imaging Dg Chest Port 1 View  06/17/2015  CLINICAL DATA:  Acute hypoxemic respiratory failure, septic shock, chest tube treatment. EXAM: PORTABLE CHEST 1 VIEW COMPARISON:  Portable chest x-ray of Jun 16, 2015 FINDINGS: The lungs are mildly hypoinflated. There are persistent interstitial densities in the left perihilar and infrahilar regions and in  the right in infrahilar region. There is no pneumothorax or large pleural effusion. The cardiac silhouette remains enlarged. The pulmonary vascularity is not engorged. Bilateral chest tubes are in stable position. The endotracheal tube tip lies 3 is 0.9 cm above the carina. The feeding tube tip projects below the inferior margin of the image. The dual-lumen right internal jugular venous catheter tip projects over the proximal SVC. IMPRESSION: Slight overall improvement in the appearance of the pulmonary interstitium. Persistent atelectasis or infiltrate bilaterally. There is no pneumothorax or significant pleural effusion. The support tubes are in stable position. Electronically Signed   By: David  Martinique M.D.   On: 06/17/2015 07:10     STUDIES:  CXR 04/25 > Low volumes. LLL opacity may reflect atelectasis or infiltrate. Renal US 04/25 > surgically absent left kidney, no hydronephrosis, small right renal cyst. CT A / P 05/02 > large right perinephric hematoma demonstrating increase in size since previous study. Echo 5/8 nml LV fn  CULTURES: Blood 04/25 > Klebsiella  Urine 04/25 > Multiple species present MRSA nares > negative Blood 04/27 > NGTD  ANTIBIOTICS: Ceftaz 04/25 >off Vanc 04/25 > 04/25 Ceftriaxone 04/29 >  SIGNIFICANT EVENTS: 04/25 > admitted with septic shock due to UTI. Significant ARF. 04/30 > transferred out of ICU. 05/03 > transferred back to ICU due to acute blood loss anemia presumed due to worsening right perinephric hematoma, hypotension. 5/3>> cardiac arrest  5/3>> IR embolization R perinephric hematoma  5/3- PTX left , bilateral chest tubes for massive sub q air - from cpr 5/7 -slow progress on pressors  LINES/TUBES: R IJ HD cath 04/26 >5/12 RIJ tnnelled cath 5/12 >> ETT 5/3>>>  L femoral CVL 5/3>>> L femoral sheath 5/3>>> L chest tube 5/3>>> R chest tube 5/3>>>  DISCUSSION: 75 y.o. M with hx RCC and prostate CA s/p radiation 2011 and left nephrectomy  with increased INR,  perinephric hematoma & hemorrhagic shock & resulting AKI (single kidney) requiring CRRT  ASSESSMENT / PLAN:  CARDIOVASCULAR A:  Hemorrhagic Shock - right perinephric hematoma +/- sepsis r/t klebsiella UTI  Hx a.fib (on coumadin), HLD, HTN.  P:  Off pressors   PULMONARY A: Acute hypoxic respiratory failure. 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 Respiratory alkalosis  Extensive Sub-q emphysema - s/p bilat chest tubes  From ptx likely left from cpr P:  SBT, trial of extubation at some point CT to water seal pcxr daily  HEMATOLOGIC / ONCOLOGIC A:  Acute blood loss anemia/hemorrhagic shock -  right perinephric hematoma - s/p IR embolization  Coagulopathy - s/p multiple units FFP and vitamin K septic shock Hx prostate CA s/p radiation 2011, RCC s/p left nephrectomy, hx DVT / PE (on coumadin) Leukocytosis Progressive thrombocytopenia  (no hep exposure) P:  F/u CBC daily - plts rebounding, Hb stable so presume bleeding has stopped SCD's HIT panel -low prob  RENAL A:  AKI - started on temp HD this admission. CKD III -- baseline Scr ~1.9 Chronic foley w/hematuria at admission - improving, likely 2/2 infx Perinephric hematoma - etiology could be benign cause, idiopathic or recurrence of malignancy given his RCC hx; increased in size 05/02. P:  Cont CVVHD, neg 150 /h if tolerated  INFECTIOUS A:  Septic shock - due to klebsiella UTI/ bacteremia.  P:  Continue ceftriaxone, stop in place 14 ds Pull femoral line soon  GASTROINTESTINAL A:  Nutrition. S/p IR NG tube,with fluoro P:  TFs to goal ppi  ENDOCRINE A:  DM. Cortisol 26 P:  Continue SSI  NEUROLOGIC A:  Pain Follows commands - mental status encouraging s/p arrest/ongoing shock  P:  Fentanyl gtt , WUA Low dose precedex if needed Rass goal 0   FAMILY  - Updates:  Updated wife at length at bedside.  Daily  -  Inter-disciplinary family meet or Palliative Care meeting due by: done 5/8  Summary - Improving organ failure, shock, bleeding resolved - severely deconditioned - trial of extubation soon  The patient is critically ill with multiple organ systems failure and requires high complexity decision making for assessment and support, frequent evaluation and titration of therapies, application of advanced monitoring technologies and extensive interpretation of multiple databases. Critical Care Time devoted to patient care services described in this note independent of APP time is 35 minutes.    Kara Mead MD. Shade Flood. [facility] Pager [phone] If no response call [phone]     06/17/2015 10:58 AM

## 2015-06-17 NOTE — Procedures (Signed)
successful RT IJ TUNNELED HD CATH TIP SVC/RA NO COMP STABLE READY FOR USE EBL 0

## 2015-06-17 NOTE — Care Management Note (Addendum)
Case Management Note  Patient Details  Name: [name] MRN: [id number] Date of Birth: 1940/03/25  Subjective/Objective:    Pt originally admitted with septic shock - then found to have perinephric hematoma                 Action/Plan:   06/17/2015  Pt remains on CRRT.  Pressor reinitiated over weekend.  CM will continue to follow for discharge needs  06/17/15 Pt remains ventilated on sedation with tube feeds.  Pt remains on CRRT - HD catheter placed in IR 06/17/15.  CM consulted with attending  regarding appropriateness for [facility] referral; pt does not appear to be appropriate for LTACH at this time - will revisit appropriateness next week   06/09/15: Pt is from home with wife - now intubated s/p IR procedure.  CM will continue to monitor for disposition needs   Expected Discharge Date:                  Expected Discharge Plan:  [place]  In-House Referral:  Clinical Social Work  Discharge planning Services  CM Consult  Post Acute Care Choice:    Choice offered to:     DME Arranged:    DME Agency:     HH Arranged:    [place] Agency:     Status of Service:  In process, will continue to follow  Medicare Important Message Given:    Date Medicare IM Given:    Medicare IM give by:    Date Additional Medicare IM Given:    Additional Medicare Important Message give by:     If discussed at [place] of Stay Meetings, dates discussed:    Additional Comments:  Maryclare Labrador, RN 06/17/2015, 10:23 AM

## 2015-06-18 ENCOUNTER — Inpatient Hospital Stay (HOSPITAL_COMMUNITY): Payer: BLUE CROSS/BLUE SHIELD

## 2015-06-18 DIAGNOSIS — N2889 Other specified disorders of kidney and ureter: Secondary | ICD-10-CM

## 2015-06-18 LAB — RENAL FUNCTION PANEL
ALBUMIN: 2.5 g/dL — AB (ref 3.5–5.0)
Albumin: 2.4 g/dL — ABNORMAL LOW (ref 3.5–5.0)
Anion gap: 9 (ref 5–15)
Anion gap: 10 (ref 5–15)
BUN: 33 mg/dL — ABNORMAL HIGH (ref 6–20)
BUN: 31 mg/dL — AB (ref 6–20)
CALCIUM: 8.3 mg/dL — AB (ref 8.9–10.3)
CO2: 25 mmol/L (ref 22–32)
CO2: 25 mmol/L (ref 22–32)
CREATININE: 1.59 mg/dL — AB (ref 0.61–1.24)
Calcium: 8.1 mg/dL — ABNORMAL LOW (ref 8.9–10.3)
Chloride: 103 mmol/L (ref 101–111)
Chloride: 101 mmol/L (ref 101–111)
Creatinine, Ser: 1.63 mg/dL — ABNORMAL HIGH (ref 0.61–1.24)
GFR calc Af Amer: 47 mL/min — ABNORMAL LOW
GFR calc Af Amer: 48 mL/min — ABNORMAL LOW (ref >60)
GFR calc non Af Amer: 40 mL/min — ABNORMAL LOW
GFR calc non Af Amer: 41 mL/min — ABNORMAL LOW (ref >60)
GLUCOSE: 189 mg/dL — AB (ref 65–99)
Glucose, Bld: 195 mg/dL — ABNORMAL HIGH (ref 65–99)
PHOSPHORUS: 2.2 mg/dL — AB (ref 2.5–4.6)
Phosphorus: 2.2 mg/dL — ABNORMAL LOW (ref 2.5–4.6)
Potassium: 4 mmol/L (ref 3.5–5.1)
Potassium: 4.2 mmol/L (ref 3.5–5.1)
SODIUM: 136 mmol/L (ref 135–145)
Sodium: 137 mmol/L (ref 135–145)

## 2015-06-18 LAB — CBC
HCT: 25.5 % — ABNORMAL LOW (ref 39.0–52.0)
Hemoglobin: 8.1 g/dL — ABNORMAL LOW (ref 13.0–17.0)
MCH: 29.5 pg (ref 26.0–34.0)
MCHC: 31.8 g/dL (ref 30.0–36.0)
MCV: 92.7 fL (ref 78.0–100.0)
PLATELETS: 67 10*3/uL — AB (ref 150–400)
RBC: 2.75 MIL/uL — ABNORMAL LOW (ref 4.22–5.81)
RDW: 18.3 % — ABNORMAL HIGH (ref 11.5–15.5)
WBC: 5.3 10*3/uL (ref 4.0–10.5)

## 2015-06-18 LAB — MAGNESIUM: MAGNESIUM: 2.2 mg/dL (ref 1.7–2.4)

## 2015-06-18 LAB — GLUCOSE, CAPILLARY
GLUCOSE-CAPILLARY: 141 mg/dL — AB (ref 65–99)
Glucose-Capillary: 143 mg/dL — ABNORMAL HIGH (ref 65–99)
Glucose-Capillary: 146 mg/dL — ABNORMAL HIGH (ref 65–99)
Glucose-Capillary: 150 mg/dL — ABNORMAL HIGH (ref 65–99)
Glucose-Capillary: 171 mg/dL — ABNORMAL HIGH (ref 65–99)
Glucose-Capillary: 179 mg/dL — ABNORMAL HIGH (ref 65–99)

## 2015-06-18 NOTE — Procedures (Signed)
Extubation Procedure Note  Patient Details:   Name: [name] DOB: [birth year] MRN: [id number]   Airway Documentation:     Evaluation  O2 sats: stable throughout and currently acceptable Complications: No apparent complications Patient did tolerate procedure well. Bilateral Breath Sounds: Diminished, Coarse crackles   Yes  Miquel Dunn 06/18/2015, 10:47 AM

## 2015-06-18 NOTE — Progress Notes (Signed)
PULMONARY / CRITICAL CARE MEDICINE   Name: [name] MRN: [id number] DOB: 1940-09-28    ADMISSION DATE:  05/31/2015 CONSULTATION DATE:  4/25  REFERRING MD:  EDP  CHIEF COMPLAINT:  Hypotension   HISTORY OF PRESENT ILLNESS:   75yo male with hx PE, OSA, CKD III (L nephrectomy r/t RCC), DM initially admitted 4/25 with sepsis r/t UTI.  Admitted by PCCM, requiring vasopressors.  He improved slowly and was tx out to med-surg on 4/30.  On 5/2 developed R flank pain and CT abd/pelvis revealed increased R perinephric hematoma worsened by heparin gtt and pt developed worsening hypotension requiring tx back to ICU and vasopressors.  Pt continued to deteriorate overnight and ultimately had cardiac arrest am 5/3 with approx 12 mins CPR.  Multiple blood products given.  Pt taken to IR for embolization of R perinephric hematoma once stabilized.  Course now c/b AKI on CKD requiring CVVHD, bilat chest tubes r/t significant sub-q emphysema post CPR.    SUBJECTIVE:  afebrile Awake on fent gtt  Remains critically ill on CRRT    VITAL SIGNS: BP 101/76 mmHg  Pulse 99  Temp(Src) 98.7 F (37.1 C) (Oral)  Resp 21  Ht 5\' 9"  (1.753 m)  Wt 290 lb 12.6 oz (131.9 kg)  BMI 42.92 kg/m2  SpO2 100%  HEMODYNAMICS:    VENTILATOR SETTINGS: Vent Mode:  [-] PSV;CPAP FiO2 (%):  [30 %] 30 % Set Rate:  [16 bmp] 16 bmp Vt Set:  [570 mL] 570 mL PEEP:  [5 cmH20] 5 cmH20 Pressure Support:  [10 cmH20] 10 cmH20 Plateau Pressure:  [15 cmH20-16 cmH20] 16 cmH20  INTAKE / OUTPUT: I/O last 3 completed shifts: In: 3791.1 [I.V.:1082.8; NG/GT:1895; IV Piggyback:813.3] Out: 6127 [Urine:55; YV:3270079; Chest Tube:60]  PHYSICAL EXAMINATION: General:  Chronically ill appearing male, acutely ill  Neuro:  RASS 0, follows commands, very weak HEENT:  Mm moist, no JVD, ETT  Cardiovascular:  s1s2 rr not tachy Lungs:  Coarse, sub q crepitus on chest, NO leaks on chest tube Abdomen: distended, hypoactive bs, no  r/g Musculoskeletal:  Warm and dry, 2+ BLE edema , ana sarca  LABS:  BMET  Recent Labs Lab 06/17/15 0508 06/17/15 1700 06/18/15 0355  NA 140 139 137  K 4.1 4.3 4.0  CL 103 104 103  CO2 27 25 25   BUN 24* 28* 33*  CREATININE 1.45* 1.65* 1.63*  GLUCOSE 133* 191* 195*    Electrolytes  Recent Labs Lab 06/16/15 0500  06/17/15 0507 06/17/15 0508 06/17/15 1700 06/18/15 0355 06/18/15 0356  CALCIUM 8.2*  < >  --  8.4* 8.2* 8.1*  --   MG 2.2  --  2.2  --   --   --  2.2  PHOS 1.6*  < >  --  2.3* 2.3* 2.2*  --   < > = values in this interval not displayed.  CBC  Recent Labs Lab 06/16/15 0500 06/17/15 0507 06/18/15 0356  WBC 4.4 5.4 5.3  HGB 8.0* 8.3* 8.1*  HCT 25.3* 27.1* 25.5*  PLT 46* 55* 67*    Coag's  Recent Labs Lab 06/14/15 1902 06/15/15 0414 06/17/15 0507  INR 1.75* 1.58* 1.57*    Sepsis Markers No results for input(s): LATICACIDVEN, PROCALCITON, O2SATVEN in the last 168 hours.  ABG  Recent Labs Lab 06/12/15 0535 06/16/15 0945  PHART 7.310* 7.372  PCO2ART 43.7 43.8  PO2ART 114* 142.0*    Liver Enzymes  Recent Labs Lab 06/13/15 0418  06/17/15 0508 06/17/15 1700 06/18/15 0355  AST 105*  --   --   --   --   ALT 178*  --   --   --   --   ALKPHOS 129*  --   --   --   --   BILITOT 2.0*  --   --   --   --   ALBUMIN 1.8*  1.8*  < > 2.6* 2.4* 2.4*  < > = values in this interval not displayed.  Cardiac Enzymes No results for input(s): TROPONINI, PROBNP in the last 168 hours.  Glucose  Recent Labs Lab 06/17/15 1108 06/17/15 1535 06/17/15 1952 06/17/15 2344 06/18/15 0318 06/18/15 0742  GLUCAP 127* 157* 161* 143* 179* 150*    Imaging Ir Fluoro Guide Cv Line Right  06/17/2015  INDICATION: Renal failure, hypertension, diabetes EXAM: ULTRASOUND GUIDANCE FOR VASCULAR ACCESS RIGHT INTERNAL JUGULAR PERMANENT HEMODIALYSIS CATHETER Date:  5/12/20175/01/2016 10:04 am Radiologist:  M. Daryll Brod, MD Guidance:  Ultrasound and fluoroscopic  FLUOROSCOPY TIME:  Fluoroscopy Time:  24 seconds (5 mGy). MEDICATIONS: Ancef 3 gm IV administered within 1 hour of the procedure ANESTHESIA/SEDATION: Moderate Sedation Time:  None. The patient was continuously monitored during the procedure by the interventional radiology nurse under my direct supervision. CONTRAST:  None. COMPLICATIONS: None immediate. PROCEDURE: Informed consent was obtained from the patient following explanation of the procedure, risks, benefits and alternatives. The patient understands, agrees and consents for the procedure. All questions were addressed. A time out was performed. Maximal barrier sterile technique utilized including caps, mask, sterile gowns, sterile gloves, large sterile drape, hand hygiene, and 2% chlorhexidine scrub. Under sterile conditions and local anesthesia, right internal jugular micropuncture venous access was performed with ultrasound. Images were obtained for documentation of the patent right internal jugular vein. A guide wire was inserted followed by a transitional dilator. Next, a 0.035 guidewire was advanced into the IVC with a 5-French catheter. Measurements were obtained from the right venotomy site to the proximal right atrium. In the right infraclavicular chest, a subcutaneous tunnel was created under sterile conditions and local anesthesia. 1% lidocaine with epinephrine was utilized for this. The 19 cm tip to cuff dialysis catheter was tunneled subcutaneously to the venotomy site and inserted into the SVC/RA junction through a valved peel-away sheath. Position was confirmed with fluoroscopy. Images were obtained for documentation. Blood was aspirated from the catheter followed by saline and heparin flushes. The appropriate volume and strength of heparin was instilled in each lumen. Caps were applied. The catheter was secured at the tunnel site with Gelfoam and a pursestring suture. The venotomy site was closed with subcuticular Vicryl suture. Dermabond was  applied to the small right neck incision. A dry sterile dressing was applied. The catheter is ready for use. No immediate complications. IMPRESSION: Ultrasound and fluoroscopically guided right internal jugular tunneled hemodialysis catheter (19 cm tip to cuff dialysis catheter). Electronically Signed   By: Jerilynn Mages.  Shick M.D.   On: 06/17/2015 10:56   Ir US Guide Vasc Access Right  06/17/2015  INDICATION: Renal failure, hypertension, diabetes EXAM: ULTRASOUND GUIDANCE FOR VASCULAR ACCESS RIGHT INTERNAL JUGULAR PERMANENT HEMODIALYSIS CATHETER Date:  5/12/20175/01/2016 10:04 am Radiologist:  M. Daryll Brod, MD Guidance:  Ultrasound and fluoroscopic FLUOROSCOPY TIME:  Fluoroscopy Time:  24 seconds (5 mGy). MEDICATIONS: Ancef 3 gm IV administered within 1 hour of the procedure ANESTHESIA/SEDATION: Moderate Sedation Time:  None. The patient was continuously monitored during the procedure by the interventional radiology nurse under my direct supervision. CONTRAST:  None. COMPLICATIONS: None immediate.  PROCEDURE: Informed consent was obtained from the patient following explanation of the procedure, risks, benefits and alternatives. The patient understands, agrees and consents for the procedure. All questions were addressed. A time out was performed. Maximal barrier sterile technique utilized including caps, mask, sterile gowns, sterile gloves, large sterile drape, hand hygiene, and 2% chlorhexidine scrub. Under sterile conditions and local anesthesia, right internal jugular micropuncture venous access was performed with ultrasound. Images were obtained for documentation of the patent right internal jugular vein. A guide wire was inserted followed by a transitional dilator. Next, a 0.035 guidewire was advanced into the IVC with a 5-French catheter. Measurements were obtained from the right venotomy site to the proximal right atrium. In the right infraclavicular chest, a subcutaneous tunnel was created under sterile  conditions and local anesthesia. 1% lidocaine with epinephrine was utilized for this. The 19 cm tip to cuff dialysis catheter was tunneled subcutaneously to the venotomy site and inserted into the SVC/RA junction through a valved peel-away sheath. Position was confirmed with fluoroscopy. Images were obtained for documentation. Blood was aspirated from the catheter followed by saline and heparin flushes. The appropriate volume and strength of heparin was instilled in each lumen. Caps were applied. The catheter was secured at the tunnel site with Gelfoam and a pursestring suture. The venotomy site was closed with subcuticular Vicryl suture. Dermabond was applied to the small right neck incision. A dry sterile dressing was applied. The catheter is ready for use. No immediate complications. IMPRESSION: Ultrasound and fluoroscopically guided right internal jugular tunneled hemodialysis catheter (19 cm tip to cuff dialysis catheter). Electronically Signed   By: Jerilynn Mages.  Shick M.D.   On: 06/17/2015 10:56     STUDIES:  CXR 04/25 > Low volumes. LLL opacity may reflect atelectasis or infiltrate. Renal US 04/25 > surgically absent left kidney, no hydronephrosis, small right renal cyst. CT A / P 05/02 > large right perinephric hematoma demonstrating increase in size since previous study. Echo 5/8 nml LV fn  CULTURES: Blood 04/25 > Klebsiella  Urine 04/25 > Multiple species present MRSA nares > negative Blood 04/27 > NGTD  ANTIBIOTICS: Ceftaz 04/25 >off Vanc 04/25 > 04/25 Ceftriaxone 04/29 >  SIGNIFICANT EVENTS: 04/25 > admitted with septic shock due to UTI. Significant ARF. 04/30 > transferred out of ICU. 05/03 > transferred back to ICU due to acute blood loss anemia presumed due to worsening right perinephric hematoma, hypotension. 5/3>> cardiac arrest  5/3>> IR embolization R perinephric hematoma  5/3- PTX left , bilateral chest tubes for massive sub q air - from cpr 5/7 -slow progress on  pressors  LINES/TUBES: R IJ HD cath 04/26 >5/12 RIJ tnnelled cath 5/12 >> ETT 5/3>>>  L femoral CVL 5/3>>> L femoral sheath 5/3>>>5/11 L chest tube 5/3>>> R chest tube 5/3>>>  DISCUSSION: 75 y.o. M with hx RCC and prostate CA s/p radiation 2011 and left nephrectomy with increased INR,  perinephric hematoma & hemorrhagic shock & resulting AKI (single kidney) requiring CRRT  ASSESSMENT / PLAN:  CARDIOVASCULAR A:  Hemorrhagic Shock - right perinephric hematoma +/- sepsis r/t klebsiella UTI  Hx a.fib (on coumadin), HLD, HTN.  P:  Off pressors   PULMONARY A: Acute hypoxic respiratory failure. 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 Respiratory alkalosis  Extensive Sub-q emphysema - s/p bilat chest tubes  From ptx likely left from cpr P:  SBT, trial of extubation if tolerates PS 5/5 today CT to water seal pcxr daily  HEMATOLOGIC / ONCOLOGIC A:  Acute blood loss anemia/hemorrhagic shock -  right perinephric hematoma - s/p IR embolization  Coagulopathy - s/p multiple units FFP and vitamin K septic shock Hx prostate CA s/p radiation 2011, RCC s/p left nephrectomy, hx DVT / PE (on coumadin) Leukocytosis Progressive thrombocytopenia  (no hep exposure) P:  F/u CBC daily -  Hb stable so presume bleeding has stopped SCD's HIT panel -low prob,plts rebounding  RENAL A:  AKI - started on temp HD this admission. CKD III -- baseline Scr ~1.9 Chronic foley w/hematuria at admission - improving, likely 2/2 infx Perinephric hematoma - etiology could be benign cause, idiopathic or recurrence of malignancy given his RCC hx; increased in size 05/02. P:  Cont CVVHD, neg 150 /h if tolerated  INFECTIOUS A:  Septic shock - due to klebsiella UTI/ bacteremia.  P:  Continue ceftriaxone, stop in place 14 ds Pull femoral line soon  GASTROINTESTINAL A:  Nutrition. S/p IR NG tube,with fluoro P:  TFs to goal, held  now ppi  ENDOCRINE A:  DM. Cortisol 26 P:  Continue SSI  NEUROLOGIC A:  Pain Follows commands - mental status encouraging s/p arrest/ongoing shock  P:  Fentanyl gtt , WUA Low dose precedex if needed Rass goal 0   FAMILY  - Updates:  Updated wife at length at bedside.  Daily  - Inter-disciplinary family meet or Palliative Care meeting due by: done 5/8  Summary - Improving organ failure, shock, bleeding resolved - severely deconditioned - trial of extubation if tolerates PS 5/5  The patient is critically ill with multiple organ systems failure and requires high complexity decision making for assessment and support, frequent evaluation and titration of therapies, application of advanced monitoring technologies and extensive interpretation of multiple databases. Critical Care Time devoted to patient care services described in this note independent of APP time is 35 minutes.    Kara Mead MD. Shade Flood. [facility] Pager [phone] If no response call [phone]     06/18/2015 9:33 AM

## 2015-06-18 NOTE — Progress Notes (Signed)
[facility] Rounding Note  Subjective/Interval Events: Undergoing vent weaning trial. Follows commands. Soft BPs yesterday afternoon, otherwise tolerating 150cc/hour off with CRRT. Tunneled dialysis catheter placed yesterday (7/93) without complication.   Objective: Vital signs in last 24 hours: Temp:  [97.5 F (36.4 C)-98.7 F (37.1 C)] 98.7 F (37.1 C) (05/13 0756) Pulse Rate:  [58-113] 77 (05/13 0800) Resp:  [15-28] 23 (05/13 0800) BP: (67-132)/(36-119) 112/76 mmHg (05/13 0730) SpO2:  [89 %-100 %] 100 % (05/13 0800) FiO2 (%):  [30 %] 30 % (05/13 0748) Weight:  [290 lb 12.6 oz (131.9 kg)] 290 lb 12.6 oz (131.9 kg) (05/13 0100) Weight change: -7 lb 15 oz (-3.6 kg)  Filed Weights   06/16/15 0337 06/17/15 0416 06/18/15 0100  Weight: 301 lb 2.4 oz (136.6 kg) 298 lb 11.6 oz (135.5 kg) 290 lb 12.6 oz (131.9 kg)   Intake/Output from previous day: 05/12 0701 - 05/13 0700 In: 2833.1 [I.V.:732.8; NG/GT:1575; IV Piggyback:525.3] Out: 3809 [Urine:25; Chest Tube:60] Intake/Output this shift:   BP 112/76 mmHg  Pulse 77  Temp(Src) 98.7 F (37.1 C) (Oral)  Resp 23  Ht _0  (1.753 m)  Wt 290 lb 12.6 oz (131.9 kg)  BMI 42.92 kg/m2  SpO2 100%  General appearance: Sedated and intubated. Anasarca Resp: intubated Coarse breath sounds bilaterally, chest tubes in places bilaterally, subq emphysema on chest wall noted, improving Cardio: RRR GI: S, NT, ND Ext: 3+ pitting edema in LE (R>L), improving though still very significant Neuro: Sedated, Does not follow commands.  Small amount reddish brown urine in foley bag New TDC R IJ (5/12)  Weight trending 06/15/15 143.7 06/16/15 136.6 06/17/15 135.5 06/18/15 131.9  Lab Results:  Recent Labs  06/17/15 0507 06/18/15 0356  WBC 5.4 5.3  HGB 8.3* 8.1*  HCT 27.1* 25.5*  PLT 55* 67*     Recent Labs  06/17/15 1700 06/18/15 0355  NA 139 137  K 4.3 4.0  CL 104 103  CO2 25 25  GLUCOSE 191* 195*  BUN 28* 33*  CREATININE 1.65* 1.63*   CALCIUM 8.2* 8.1*   PHOSPHORUS  Date/Time Value Ref Range Status  06/18/2015 03:55 AM 2.2* 2.5 - 4.6 mg/dL Final  06/17/2015 05:00 PM 2.3* 2.5 - 4.6 mg/dL Final  06/17/2015 05:08 AM 2.3* 2.5 - 4.6 mg/dL Final    Lab Results  Component Value Date   INR 1.57* 06/17/2015   INR 1.58* 06/15/2015   INR 1.75* 06/14/2015    Studies/Results: Ir Fluoro Guide Cv Line Right  06/17/2015  INDICATION: Renal failure, hypertension, diabetes EXAM: ULTRASOUND GUIDANCE FOR VASCULAR ACCESS RIGHT INTERNAL JUGULAR PERMANENT HEMODIALYSIS CATHETER Date:  5/12/20175/01/2016 10:04 am Radiologist:  M. Daryll Brod, MD Guidance:  Ultrasound and fluoroscopic FLUOROSCOPY TIME:  Fluoroscopy Time:  24 seconds (5 mGy). MEDICATIONS: Ancef 3 gm IV administered within 1 hour of the procedure ANESTHESIA/SEDATION: Moderate Sedation Time:  None. The patient was continuously monitored during the procedure by the interventional radiology nurse under my direct supervision. CONTRAST:  None. COMPLICATIONS: None immediate. PROCEDURE: Informed consent was obtained from the patient following explanation of the procedure, risks, benefits and alternatives. The patient understands, agrees and consents for the procedure. All questions were addressed. A time out was performed. Maximal barrier sterile technique utilized including caps, mask, sterile gowns, sterile gloves, large sterile drape, hand hygiene, and 2% chlorhexidine scrub. Under sterile conditions and local anesthesia, right internal jugular micropuncture venous access was performed with ultrasound. Images were obtained for documentation of the patent right internal jugular vein. A  guide wire was inserted followed by a transitional dilator. Next, a 0.035 guidewire was advanced into the IVC with a 5-French catheter. Measurements were obtained from the right venotomy site to the proximal right atrium. In the right infraclavicular chest, a subcutaneous tunnel was created under sterile  conditions and local anesthesia. 1% lidocaine with epinephrine was utilized for this. The 19 cm tip to cuff dialysis catheter was tunneled subcutaneously to the venotomy site and inserted into the SVC/RA junction through a valved peel-away sheath. Position was confirmed with fluoroscopy. Images were obtained for documentation. Blood was aspirated from the catheter followed by saline and heparin flushes. The appropriate volume and strength of heparin was instilled in each lumen. Caps were applied. The catheter was secured at the tunnel site with Gelfoam and a pursestring suture. The venotomy site was closed with subcuticular Vicryl suture. Dermabond was applied to the small right neck incision. A dry sterile dressing was applied. The catheter is ready for use. No immediate complications. IMPRESSION: Ultrasound and fluoroscopically guided right internal jugular tunneled hemodialysis catheter (19 cm tip to cuff dialysis catheter). Electronically Signed   By: Jerilynn Mages.  Shick M.D.   On: 06/17/2015 10:56   Ir US Guide Vasc Access Right  06/17/2015  INDICATION: Renal failure, hypertension, diabetes EXAM: ULTRASOUND GUIDANCE FOR VASCULAR ACCESS RIGHT INTERNAL JUGULAR PERMANENT HEMODIALYSIS CATHETER Date:  5/12/20175/01/2016 10:04 am Radiologist:  M. Daryll Brod, MD Guidance:  Ultrasound and fluoroscopic FLUOROSCOPY TIME:  Fluoroscopy Time:  24 seconds (5 mGy). MEDICATIONS: Ancef 3 gm IV administered within 1 hour of the procedure ANESTHESIA/SEDATION: Moderate Sedation Time:  None. The patient was continuously monitored during the procedure by the interventional radiology nurse under my direct supervision. CONTRAST:  None. COMPLICATIONS: None immediate. PROCEDURE: Informed consent was obtained from the patient following explanation of the procedure, risks, benefits and alternatives. The patient understands, agrees and consents for the procedure. All questions were addressed. A time out was performed. Maximal barrier sterile  technique utilized including caps, mask, sterile gowns, sterile gloves, large sterile drape, hand hygiene, and 2% chlorhexidine scrub. Under sterile conditions and local anesthesia, right internal jugular micropuncture venous access was performed with ultrasound. Images were obtained for documentation of the patent right internal jugular vein. A guide wire was inserted followed by a transitional dilator. Next, a 0.035 guidewire was advanced into the IVC with a 5-French catheter. Measurements were obtained from the right venotomy site to the proximal right atrium. In the right infraclavicular chest, a subcutaneous tunnel was created under sterile conditions and local anesthesia. 1% lidocaine with epinephrine was utilized for this. The 19 cm tip to cuff dialysis catheter was tunneled subcutaneously to the venotomy site and inserted into the SVC/RA junction through a valved peel-away sheath. Position was confirmed with fluoroscopy. Images were obtained for documentation. Blood was aspirated from the catheter followed by saline and heparin flushes. The appropriate volume and strength of heparin was instilled in each lumen. Caps were applied. The catheter was secured at the tunnel site with Gelfoam and a pursestring suture. The venotomy site was closed with subcuticular Vicryl suture. Dermabond was applied to the small right neck incision. A dry sterile dressing was applied. The catheter is ready for use. No immediate complications. IMPRESSION: Ultrasound and fluoroscopically guided right internal jugular tunneled hemodialysis catheter (19 cm tip to cuff dialysis catheter). Electronically Signed   By: Jerilynn Mages.  Shick M.D.   On: 06/17/2015 10:56   Dg Chest Port 1 View  06/17/2015  CLINICAL DATA:  Acute hypoxemic respiratory  failure, septic shock, chest tube treatment. EXAM: PORTABLE CHEST 1 VIEW COMPARISON:  Portable chest x-ray of Jun 16, 2015 FINDINGS: The lungs are mildly hypoinflated. There are persistent interstitial  densities in the left perihilar and infrahilar regions and in the right in infrahilar region. There is no pneumothorax or large pleural effusion. The cardiac silhouette remains enlarged. The pulmonary vascularity is not engorged. Bilateral chest tubes are in stable position. The endotracheal tube tip lies 3 is 0.9 cm above the carina. The feeding tube tip projects below the inferior margin of the image. The dual-lumen right internal jugular venous catheter tip projects over the proximal SVC. IMPRESSION: Slight overall improvement in the appearance of the pulmonary interstitium. Persistent atelectasis or infiltrate bilaterally. There is no pneumothorax or significant pleural effusion. The support tubes are in stable position. Electronically Signed   By: David  Martinique M.D.   On: 06/17/2015 07:10   Dg Chest Port 1 View  06/16/2015  CLINICAL DATA:  Respiratory failure EXAM: PORTABLE CHEST 1 VIEW COMPARISON:  06/14/2015 FINDINGS: Endotracheal tube terminates 1 cm above the carina. Increased interstitial markings. Mild patchy left basilar opacity, likely atelectasis. Possible small left pleural effusion. Bilateral chest tubes.  No definite pneumothorax is seen. Right IJ venous catheter terminates in the mid SVC. Enteric tube courses below the diaphragm. IMPRESSION: Mild patchy left basilar opacity, likely atelectasis. Possible small left pleural effusion. Bilateral chest tubes.  No definite pneumothorax is seen. Endotracheal tube terminates 1 cm above the carina. Additional stable support apparatus as above. Electronically Signed   By: Julian Hy M.D.   On: 06/16/2015 11:23    Scheduled Medications: . sodium chloride   Intravenous Once  . sodium chloride   Intravenous Once  . sodium chloride   Intravenous Once  . sodium chloride   Intravenous Once  . sodium chloride   Intravenous Once  . antiseptic oral rinse  7 mL Mouth Rinse QID  . chlorhexidine gluconate (SAGE KIT)  15 mL Mouth Rinse BID  .  sennosides  5 mL Oral BID   And  . docusate  100 mg Oral BID  . feeding supplement (PRO-STAT SUGAR FREE 64)  30 mL Per Tube TID  . fentaNYL (SUBLIMAZE) injection  50 mcg Intravenous Once  . insulin aspart  0-15 Units Subcutaneous Q4H  . pantoprazole sodium  40 mg Per Tube Q1200  . sodium chloride flush  10-40 mL Intracatheter Q12H  . sodium phosphate  Dextrose 5% IVPB  10 mmol Intravenous BID   Infusions Medications . sodium chloride 10 mL/hr at 06/14/15 0600  . dexmedetomidine 0.2 mcg/kg/hr (06/16/15 0900)  . feeding supplement (VITAL HIGH PROTEIN) 1,000 mL (06/18/15 0418)  . fentaNYL infusion INTRAVENOUS 300 mcg/hr (06/18/15 0501)  . dialysis replacement fluid (prismasate) 300 mL/hr at 06/17/15 2305  . dialysis replacement fluid (prismasate) 400 mL/hr at 06/18/15 0046  . dialysate (PRISMASATE) 2,000 mL/hr at 06/18/15 1937    Assessment/ Plan: Pt is a 75 y.o. yo male who was admitted on 05/31/2015 with septic shock 2/2 klebsiella bacteremia. Readmitted to the ICU 06/08/2015 with hemorrhagic shock in the setting of a right perinephric hematoma. Now s/p coil embolization.  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 Patient's Cr was notably elevated at 9.33, his baseline is thought to be around 1.7-1.9. Now has bilat chest tubes r/t significant sub-q emphysema post CPR.   Initially IHD (started 06/02/15) then transitioned to  CRRT d/t shock (hemnorrhagic - perinephric hematoma).  Has been back on CRRT since 5/3 d/t tenuous hemodynamics and massive anasarca.    1. AKI/CKD, patient s/p left nephrectomy and now s/p partial right renal artery coil embolization 2/2 perinephric hematoma. Anuric. Doubtful that patient will have any significant renal recovery. Will likely be dialysis dependent moving forward.   - On CVVHD - Continue drawing volume as tolerated (Goal -1108m/hr)  -  Giving colloid replacement via FFP  - Giving phos repletion 138ml IV bid   - TDC placed 5/12 2. Hemorrhagic shock 2/2 Acute blood loss anemia 2/2 right perinephric hematoma. Also with Coagulopathy with thrombocytopenia, elevated INR. S/p coil embolization by IR. s/p FFP and vit K. BPs now stable off pressors.  - Has been getting FFP and pRBC per CCM - this will also help increase intravascular oncotic pressure  - Not getting heparin.   - received 1 unit PRBC's 5/10 Hb now about 8 3. Urosepsis / Positive blood culture pan-sensitive Kleb Pneumo. Completed course of abx. Per CCM.   CaDimas Chyle/13/2017,8:04 AM   I have seen and examined this patient and agree with plan and assessment in the above note. Overall tolerating CRRT. Weight coming down (12 kg so far this week) though still with marked anasarca. Continuing CRRT until better volume control. [facility]. K fine, phos stable on BID replacement. No change in Rx.  Syed Zukas B,MD 06/18/2015 8:45 AM

## 2015-06-18 NOTE — Progress Notes (Signed)
Wasted 150 ml of fentanyl in sink. Gilbert witnessed waste.

## 2015-06-19 ENCOUNTER — Inpatient Hospital Stay (HOSPITAL_COMMUNITY): Payer: BLUE CROSS/BLUE SHIELD

## 2015-06-19 LAB — RENAL FUNCTION PANEL
ALBUMIN: 2.7 g/dL — AB (ref 3.5–5.0)
ALBUMIN: 2.3 g/dL — AB (ref 3.5–5.0)
ANION GAP: 11 (ref 5–15)
Anion gap: 11 (ref 5–15)
BUN: 29 mg/dL — ABNORMAL HIGH (ref 6–20)
BUN: 27 mg/dL — AB (ref 6–20)
CALCIUM: 8.6 mg/dL — AB (ref 8.9–10.3)
CO2: 26 mmol/L (ref 22–32)
CO2: 25 mmol/L (ref 22–32)
CREATININE: 1.73 mg/dL — AB (ref 0.61–1.24)
Calcium: 8.2 mg/dL — ABNORMAL LOW (ref 8.9–10.3)
Chloride: 103 mmol/L (ref 101–111)
Chloride: 103 mmol/L (ref 101–111)
Creatinine, Ser: 1.69 mg/dL — ABNORMAL HIGH (ref 0.61–1.24)
GFR calc Af Amer: 43 mL/min — ABNORMAL LOW (ref >60)
GFR, EST AFRICAN AMERICAN: 45 mL/min — AB (ref >60)
GFR, EST NON AFRICAN AMERICAN: 38 mL/min — AB (ref >60)
GFR, EST NON AFRICAN AMERICAN: 37 mL/min — AB (ref >60)
Glucose, Bld: 169 mg/dL — ABNORMAL HIGH (ref 65–99)
Glucose, Bld: 156 mg/dL — ABNORMAL HIGH (ref 65–99)
PHOSPHORUS: 2.8 mg/dL (ref 2.5–4.6)
Phosphorus: 2.5 mg/dL (ref 2.5–4.6)
Potassium: 4.6 mmol/L (ref 3.5–5.1)
Potassium: 4.4 mmol/L (ref 3.5–5.1)
SODIUM: 140 mmol/L (ref 135–145)
Sodium: 139 mmol/L (ref 135–145)

## 2015-06-19 LAB — CBC
HCT: 26.6 % — ABNORMAL LOW (ref 39.0–52.0)
HEMOGLOBIN: 8.4 g/dL — AB (ref 13.0–17.0)
MCH: 29.3 pg (ref 26.0–34.0)
MCHC: 31.6 g/dL (ref 30.0–36.0)
MCV: 92.7 fL (ref 78.0–100.0)
Platelets: 108 10*3/uL — ABNORMAL LOW (ref 150–400)
RBC: 2.87 MIL/uL — AB (ref 4.22–5.81)
RDW: 18.7 % — ABNORMAL HIGH (ref 11.5–15.5)
WBC: 6.3 10*3/uL (ref 4.0–10.5)

## 2015-06-19 LAB — GLUCOSE, CAPILLARY
GLUCOSE-CAPILLARY: 136 mg/dL — AB (ref 65–99)
GLUCOSE-CAPILLARY: 147 mg/dL — AB (ref 65–99)
GLUCOSE-CAPILLARY: 153 mg/dL — AB (ref 65–99)
Glucose-Capillary: 148 mg/dL — ABNORMAL HIGH (ref 65–99)
Glucose-Capillary: 117 mg/dL — ABNORMAL HIGH (ref 65–99)
Glucose-Capillary: 140 mg/dL — ABNORMAL HIGH (ref 65–99)

## 2015-06-19 LAB — MAGNESIUM: MAGNESIUM: 2.4 mg/dL (ref 1.7–2.4)

## 2015-06-19 MED ORDER — VITAL HIGH PROTEIN PO LIQD
1000.0000 mL | ORAL | Status: DC
Start: 2015-06-19 — End: 2015-06-20
  Administered 2015-06-19: 21:00:00
  Administered 2015-06-19: 1000 mL
  Administered 2015-06-19: 22:00:00
  Administered 2015-06-20: 1000 mL

## 2015-06-19 MED ORDER — DEXTROSE 5 % IV SOLN
2.0000 ug/min | INTRAVENOUS | Status: DC
  Administered 2015-06-19: 4 ug/min via INTRAVENOUS
  Administered 2015-06-20: 2 ug/min via INTRAVENOUS
  Filled 2015-06-19 (×2): qty 4

## 2015-06-19 MED ORDER — GUAIFENESIN 100 MG/5ML PO SOLN
5.0000 mL | ORAL | Status: DC | PRN
  Administered 2015-06-23 – 2015-07-04 (×10): 100 mg
  Filled 2015-06-19 (×11): qty 5

## 2015-06-19 NOTE — Progress Notes (Signed)
Dr Halford Chessman notified of blood pressures

## 2015-06-19 NOTE — Progress Notes (Signed)
Dr Elsworth Soho and Dr Lorrene Reid made aware of bp's. Both ordered to adjust fluid removal rate as tolerated.

## 2015-06-19 NOTE — Progress Notes (Signed)
Error See Neph note 5/14  Jamal Maes, MD [facility] [phone] Pager 06/19/2015, 4:13 PM

## 2015-06-19 NOTE — Progress Notes (Signed)
SLP Cancellation Note  Patient Details Name: SHARIFF CORNEAU MRN: [id number] DOB: 05/08/1940   Cancelled treatment:       Reason Eval/Treat Not Completed: Patient not medically ready. Discussed with RN. Pt is very weak and likely not ready for PO. He has NG. SLP will return tomorrow to attempt assessment.    Georgiann Neider, Katherene Ponto 06/19/2015, 8:25 AM

## 2015-06-19 NOTE — Progress Notes (Signed)
PULMONARY / CRITICAL CARE MEDICINE   Name: [name] MRN: [id number] DOB: 05-05-1940    ADMISSION DATE:  05/31/2015 CONSULTATION DATE:  4/25  REFERRING MD:  EDP  CHIEF COMPLAINT:  Hypotension   HISTORY OF PRESENT ILLNESS:   75yo male with hx PE, OSA, CKD III (L nephrectomy r/t RCC), DM initially admitted 4/25 with sepsis r/t UTI.  Admitted by PCCM, requiring vasopressors.  He improved slowly and was tx out to med-surg on 4/30.  On 5/2 developed R flank pain and CT abd/pelvis revealed increased R perinephric hematoma worsened by heparin gtt and pt developed worsening hypotension requiring tx back to ICU and vasopressors.  Pt continued to deteriorate overnight and ultimately had cardiac arrest am 5/3 with approx 12 mins CPR.  Multiple blood products given.  Pt taken to IR for embolization of R perinephric hematoma once stabilized.  Course now c/b AKI on CKD requiring CVVHD, bilat chest tubes r/t significant sub-q emphysema post CPR.    SUBJECTIVE:  afebrile Extubated , needed NTs overnight - cough gradually improving Remains on CRRT    VITAL SIGNS: BP 80/62 mmHg  Pulse 74  Temp(Src) 98.7 F (37.1 C) (Oral)  Resp 22  Ht 5\' 9"  (1.753 m)  Wt 278 lb 3.5 oz (126.2 kg)  BMI 41.07 kg/m2  SpO2 98%  HEMODYNAMICS:    VENTILATOR SETTINGS:    INTAKE / OUTPUT: I/O last 3 completed shifts: In: 2424.1 [I.V.:522.8; NG/GT:1060; IV Piggyback:841.3] Out: 7223 [Other:7163; Chest Tube:60]  PHYSICAL EXAMINATION: General:  Chronically ill appearing male, acutely ill  Neuro:  RASS 0, follows commands, very weak HEENT:  Mm moist, no JVD Cardiovascular:  s1s2 rr not tachy Lungs:  Coarse, decreased crepitus on chest, NO leaks on chest tube Abdomen: distended, hypoactive bs, no r/g Musculoskeletal:  Warm and dry, 2+ BLE edema , ana sarca  LABS:  BMET  Recent Labs Lab 06/18/15 0355 06/18/15 1614 06/19/15 0415  NA 137 136 140  K 4.0 4.2 4.6  CL 103 101 103  CO2 25 25 26   BUN 33*  31* 29*  CREATININE 1.63* 1.59* 1.69*  GLUCOSE 195* 189* 169*    Electrolytes  Recent Labs Lab 06/17/15 0507  06/18/15 0355 06/18/15 0356 06/18/15 1614 06/19/15 0415  CALCIUM  --   < > 8.1*  --  8.3* 8.6*  MG 2.2  --   --  2.2  --  2.4  PHOS  --   < > 2.2*  --  2.2* 2.5  < > = values in this interval not displayed.  CBC  Recent Labs Lab 06/17/15 0507 06/18/15 0356 06/19/15 0415  WBC 5.4 5.3 6.3  HGB 8.3* 8.1* 8.4*  HCT 27.1* 25.5* 26.6*  PLT 55* 67* 108*    Coag's  Recent Labs Lab 06/14/15 1902 06/15/15 0414 06/17/15 0507  INR 1.75* 1.58* 1.57*    Sepsis Markers No results for input(s): LATICACIDVEN, PROCALCITON, O2SATVEN in the last 168 hours.  ABG  Recent Labs Lab 06/16/15 0945  PHART 7.372  PCO2ART 43.8  PO2ART 142.0*    Liver Enzymes  Recent Labs Lab 06/13/15 0418  06/18/15 0355 06/18/15 1614 06/19/15 0415  AST 105*  --   --   --   --   ALT 178*  --   --   --   --   ALKPHOS 129*  --   --   --   --   BILITOT 2.0*  --   --   --   --  ALBUMIN 1.8*  1.8*  < > 2.4* 2.5* 2.7*  < > = values in this interval not displayed.  Cardiac Enzymes No results for input(s): TROPONINI, PROBNP in the last 168 hours.  Glucose  Recent Labs Lab 06/18/15 1123 06/18/15 1602 06/18/15 1925 06/19/15 0017 06/19/15 0409 06/19/15 0748  GLUCAP 141* 171* 146* 153* 148* 117*    Imaging Dg Chest Port 1 View  06/19/2015  CLINICAL DATA:  Acute respiratory failure EXAM: PORTABLE CHEST 1 VIEW COMPARISON:  06/18/2015 FINDINGS: Cardiomegaly again noted. Stable right IJ central line. Bilateral chest tubes are unchanged in position. No pneumothorax. Stable NG tube position. Endotracheal tube has been removed. Persistent left basilar atelectasis or infiltrate. Trace right basilar atelectasis. No pulmonary edema. IMPRESSION: Stable NG tube position. Endotracheal tube has been removed. Bilateral chest tube is unchanged in position. Persistent left basilar atelectasis  or infiltrate. There is no pulmonary edema. Electronically Signed   By: Lahoma Crocker M.D.   On: 06/19/2015 09:06     STUDIES:  CXR 04/25 > Low volumes. LLL opacity may reflect atelectasis or infiltrate. Renal US 04/25 > surgically absent left kidney, no hydronephrosis, small right renal cyst. CT A / P 05/02 > large right perinephric hematoma demonstrating increase in size since previous study. Echo 5/8 nml LV fn  CULTURES: Blood 04/25 > Klebsiella  Urine 04/25 > Multiple species present MRSA nares > negative Blood 04/27 > NGTD  ANTIBIOTICS: Ceftaz 04/25 >off Vanc 04/25 > 04/25 Ceftriaxone 04/29 > 5/10  SIGNIFICANT EVENTS: 04/25 > admitted with septic shock due to UTI. Significant ARF. 04/30 > transferred out of ICU. 05/03 > transferred back to ICU due to acute blood loss anemia presumed due to worsening right perinephric hematoma, hypotension. 5/3>> cardiac arrest  5/3>> IR embolization R perinephric hematoma  5/3- PTX left , bilateral chest tubes for massive sub q air - from cpr 5/7 -slow progress on pressors  LINES/TUBES: R IJ HD cath 04/26 >5/12 RIJ tnnelled cath 5/12 >> ETT 5/3>>> 5/13 L femoral CVL 5/3>>> L femoral sheath 5/3>>>5/11 L chest tube 5/3>>> R chest tube 5/3>>>  DISCUSSION: 75 y.o. M with hx RCC and prostate CA s/p radiation 2011 and left nephrectomy with increased INR,  perinephric hematoma & hemorrhagic shock & resulting AKI (single kidney) requiring CRRT  ASSESSMENT / PLAN:  CARDIOVASCULAR A:  Hemorrhagic Shock - right perinephric hematoma +/- sepsis r/t klebsiella UTI  Hx a.fib (on coumadin), HLD, HTN.  P:  Off pressors   PULMONARY A: Acute hypoxic respiratory failure. 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 Respiratory alkalosis  Extensive Sub-q emphysema - s/p bilat chest tubes  From ptx likely left from cpr P:   extubated Dc chset tubes & rpt pcxr  NTS prn  HEMATOLOGIC / ONCOLOGIC A:   Acute blood loss anemia/hemorrhagic shock -  right perinephric hematoma - s/p IR embolization  Coagulopathy - s/p multiple units FFP and vitamin K septic shock Hx prostate CA s/p radiation 2011, RCC s/p left nephrectomy, hx DVT / PE (on coumadin) Leukocytosis Progressive thrombocytopenia  (no hep exposure) P:  F/u CBC daily -  Hb stable so presume bleeding has stopped SCD's HIT panel for completion -low prob,plts rebounding  RENAL A:  AKI - started on temp HD this admission. CKD III -- baseline Scr ~1.9 Chronic foley w/hematuria at admission - improving, likely 2/2 infx Perinephric hematoma - etiology could be benign cause, idiopathic or recurrence of malignancy given his RCC  hx; increased in size 05/02. P:  Cont CVVHD, neg 150 /h if tolerated  INFECTIOUS A:  Septic shock - due to klebsiella UTI/ bacteremia.  P:  Completed ceftriaxone x 14 ds Pull femoral line soon  GASTROINTESTINAL A:  Nutrition. S/p IR NG tube,with fluoro P:  TFs to resume ppi  ENDOCRINE A:  DM. Cortisol 26 P:  Continue SSI  NEUROLOGIC A:  Pain Follows commands - mental status encouraging s/p arrest/ongoing shock  Severe deconditioning P:  PT consult to mobilise   FAMILY  - Updates:  Updated wife at length at bedside.  Daily  - Inter-disciplinary family meet or Palliative Care meeting due by: done 5/8  Summary - Improving organ failure, shock, bleeding resolved - severely deconditioned -tolerated extubation  The patient is critically ill with multiple organ systems failure and requires high complexity decision making for assessment and support, frequent evaluation and titration of therapies, application of advanced monitoring technologies and extensive interpretation of multiple databases. Critical Care Time devoted to patient care services described in this note independent of APP time is 35 minutes.    Kara Mead MD. Shade Flood. [facility] Pager [phone] If no response call [phone]     06/19/2015 12:13 PM

## 2015-06-19 NOTE — Progress Notes (Signed)
[facility] Rounding Note  Subjective/Interval Events:  Extubated since yesterday Tries to answer some questions - very slow to respond Seems to deny pain  Remains on CRRT/ neg 150/hour as tol/all 4K/no heparin/iv phos BID Tunneled dialysis catheter placed 5/12  Objective: Vital signs in last 24 hours: Temp:  [97.2 F (36.2 C)-98.7 F (37.1 C)] 97.2 F (36.2 C) (05/14 0410) Pulse Rate:  [68-115] 98 (05/14 0600) Resp:  [21-39] 24 (05/14 0600) BP: (57-138)/(35-107) 98/61 mmHg (05/14 0600) SpO2:  [96 %-100 %] 100 % (05/14 0600) FiO2 (%):  [30 %] 30 % (05/13 0800) Weight:  [126.2 kg (278 lb 3.5 oz)] 126.2 kg (278 lb 3.5 oz) (05/14 0600) Weight change: -5.7 kg (-12 lb 9.1 oz)  Filed Weights   06/17/15 0416 06/18/15 0100 06/19/15 0600  Weight: 135.5 kg (298 lb 11.6 oz) 131.9 kg (290 lb 12.6 oz) 126.2 kg (278 lb 3.5 oz)   Intake/Output from previous day: 05/13 0701 - 05/14 0700 In: 806 [I.V.:130; NG/GT:130; IV Piggyback:546] Out: 4369  Intake/Output this shift:   BP 98/61 mmHg  Pulse 98  Temp(Src) 97.2 F (36.2 C) (Oral)  Resp 24  Ht '5\' 9"'  (1.753 m)  Wt 126.2 kg (278 lb 3.5 oz)  BMI 41.07 kg/m2  SpO2 100%  General appearance:  Resp: Extubated and on nasal cannula. Coarse BS/ant coarse crackles, scattered exp wheeze + SQ air unchanged, chest tubes in place Regular S1S2 No S3 GI: + BS, no abd tenderness Ext: 2+ pitting edema in LE (R>L) seems to be improving Has some new mottling over knees and R foot Neuro - awakens, follows commands, tries to answer questions, speaks softly Minimal urine in foley TDC R IJ (5/12)working fine  Weight trending 06/15/15 143.7 06/16/15 136.6 06/17/15 135.5 06/18/15 131.9 06/19/15  126.2  Lab Results:  Recent Labs  06/18/15 0356 06/19/15 0415  WBC 5.3 6.3  HGB 8.1* 8.4*  HCT 25.5* 26.6*  PLT 67* 108*     Recent Labs  06/18/15 1614 06/19/15 0415  NA 136 140  K 4.2 4.6  CL 101 103  CO2 25 26  GLUCOSE 189* 169*  BUN 31* 29*   CREATININE 1.59* 1.69*  CALCIUM 8.3* 8.6*   PHOSPHORUS  Date/Time Value Ref Range Status  06/19/2015 04:15 AM 2.5 2.5 - 4.6 mg/dL Final  06/18/2015 04:14 PM 2.2* 2.5 - 4.6 mg/dL Final  06/18/2015 03:55 AM 2.2* 2.5 - 4.6 mg/dL Final    Lab Results  Component Value Date   INR 1.57* 06/17/2015   INR 1.58* 06/15/2015   INR 1.75* 06/14/2015    Studies/Results: Ir Fluoro Guide Cv Line Right  06/17/2015  INDICATION: Renal failure, hypertension, diabetes EXAM: ULTRASOUND GUIDANCE FOR VASCULAR ACCESS RIGHT INTERNAL JUGULAR PERMANENT HEMODIALYSIS CATHETER Date:  5/12/20175/01/2016 10:04 am Radiologist:  M. Daryll Brod, MD Guidance:  Ultrasound and fluoroscopic FLUOROSCOPY TIME:  Fluoroscopy Time:  24 seconds (5 mGy). MEDICATIONS: Ancef 3 gm IV administered within 1 hour of the procedure ANESTHESIA/SEDATION: Moderate Sedation Time:  None. The patient was continuously monitored during the procedure by the interventional radiology nurse under my direct supervision. CONTRAST:  None. COMPLICATIONS: None immediate. PROCEDURE: Informed consent was obtained from the patient following explanation of the procedure, risks, benefits and alternatives. The patient understands, agrees and consents for the procedure. All questions were addressed. A time out was performed. Maximal barrier sterile technique utilized including caps, mask, sterile gowns, sterile gloves, large sterile drape, hand hygiene, and 2% chlorhexidine scrub. Under sterile conditions and local anesthesia,  right internal jugular micropuncture venous access was performed with ultrasound. Images were obtained for documentation of the patent right internal jugular vein. A guide wire was inserted followed by a transitional dilator. Next, a 0.035 guidewire was advanced into the IVC with a 5-French catheter. Measurements were obtained from the right venotomy site to the proximal right atrium. In the right infraclavicular chest, a subcutaneous tunnel was  created under sterile conditions and local anesthesia. 1% lidocaine with epinephrine was utilized for this. The 19 cm tip to cuff dialysis catheter was tunneled subcutaneously to the venotomy site and inserted into the SVC/RA junction through a valved peel-away sheath. Position was confirmed with fluoroscopy. Images were obtained for documentation. Blood was aspirated from the catheter followed by saline and heparin flushes. The appropriate volume and strength of heparin was instilled in each lumen. Caps were applied. The catheter was secured at the tunnel site with Gelfoam and a pursestring suture. The venotomy site was closed with subcuticular Vicryl suture. Dermabond was applied to the small right neck incision. A dry sterile dressing was applied. The catheter is ready for use. No immediate complications. IMPRESSION: Ultrasound and fluoroscopically guided right internal jugular tunneled hemodialysis catheter (19 cm tip to cuff dialysis catheter). Electronically Signed   By: Jerilynn Mages.  Shick M.D.   On: 06/17/2015 10:56   Ir US Guide Vasc Access Right  06/17/2015  INDICATION: Renal failure, hypertension, diabetes EXAM: ULTRASOUND GUIDANCE FOR VASCULAR ACCESS RIGHT INTERNAL JUGULAR PERMANENT HEMODIALYSIS CATHETER Date:  5/12/20175/01/2016 10:04 am Radiologist:  M. Daryll Brod, MD Guidance:  Ultrasound and fluoroscopic FLUOROSCOPY TIME:  Fluoroscopy Time:  24 seconds (5 mGy). MEDICATIONS: Ancef 3 gm IV administered within 1 hour of the procedure ANESTHESIA/SEDATION: Moderate Sedation Time:  None. The patient was continuously monitored during the procedure by the interventional radiology nurse under my direct supervision. CONTRAST:  None. COMPLICATIONS: None immediate. PROCEDURE: Informed consent was obtained from the patient following explanation of the procedure, risks, benefits and alternatives. The patient understands, agrees and consents for the procedure. All questions were addressed. A time out was performed.  Maximal barrier sterile technique utilized including caps, mask, sterile gowns, sterile gloves, large sterile drape, hand hygiene, and 2% chlorhexidine scrub. Under sterile conditions and local anesthesia, right internal jugular micropuncture venous access was performed with ultrasound. Images were obtained for documentation of the patent right internal jugular vein. A guide wire was inserted followed by a transitional dilator. Next, a 0.035 guidewire was advanced into the IVC with a 5-French catheter. Measurements were obtained from the right venotomy site to the proximal right atrium. In the right infraclavicular chest, a subcutaneous tunnel was created under sterile conditions and local anesthesia. 1% lidocaine with epinephrine was utilized for this. The 19 cm tip to cuff dialysis catheter was tunneled subcutaneously to the venotomy site and inserted into the SVC/RA junction through a valved peel-away sheath. Position was confirmed with fluoroscopy. Images were obtained for documentation. Blood was aspirated from the catheter followed by saline and heparin flushes. The appropriate volume and strength of heparin was instilled in each lumen. Caps were applied. The catheter was secured at the tunnel site with Gelfoam and a pursestring suture. The venotomy site was closed with subcuticular Vicryl suture. Dermabond was applied to the small right neck incision. A dry sterile dressing was applied. The catheter is ready for use. No immediate complications. IMPRESSION: Ultrasound and fluoroscopically guided right internal jugular tunneled hemodialysis catheter (19 cm tip to cuff dialysis catheter). Electronically Signed   By: Jerilynn Mages.  Shick M.D.   On: 06/17/2015 10:56   Dg Chest Port 1 View  06/18/2015  CLINICAL DATA:  75 year old male with history of acute respiratory failure. EXAM: PORTABLE CHEST 1 VIEW COMPARISON:  Chest x-ray 06/17/2015. FINDINGS: Right IJ Vas-Cath with tip terminating in the mid superior vena cava. An  endotracheal tube is in place with tip 1.7 cm above the carina. A feeding tube is seen extending into the abdomen, however, the tip of the feeding tube extends below the lower margin of the image. Left-sided chest tube in position with tip and side port projecting over the left hemithorax. Right-sided chest tube with tip and side port projecting over the lateral right hemithorax. No appreciable pneumothorax identified. Low lung volumes. Bibasilar opacities (left greater than right). Small left pleural effusion. Crowding of the pulmonary vasculature, without frank pulmonary edema. Heart size is normal. The patient is rotated to the left on today's exam, resulting in distortion of the mediastinal contours and reduced diagnostic sensitivity and specificity for mediastinal pathology. IMPRESSION: 1. Support apparatus, as above. 2. Low lung volumes with bibasilar opacities (left greater than right) which may reflect areas of atelectasis and/or consolidation. 3. Small left pleural effusion. Electronically Signed   By: Vinnie Langton M.D.   On: 06/18/2015 11:05    Scheduled Medications: . sodium chloride   Intravenous Once  . sodium chloride   Intravenous Once  . sodium chloride   Intravenous Once  . sodium chloride   Intravenous Once  . sodium chloride   Intravenous Once  . antiseptic oral rinse  7 mL Mouth Rinse QID  . chlorhexidine gluconate (SAGE KIT)  15 mL Mouth Rinse BID  . sennosides  5 mL Oral BID   And  . docusate  100 mg Oral BID  . insulin aspart  0-15 Units Subcutaneous Q4H  . pantoprazole sodium  40 mg Per Tube Q1200  . sodium chloride flush  10-40 mL Intracatheter Q12H  . sodium phosphate  Dextrose 5% IVPB  10 mmol Intravenous BID   Infusions Medications . sodium chloride 10 mL/hr at 06/14/15 0600  . dialysis replacement fluid (prismasate) 300 mL/hr at 06/18/15 1610  . dialysis replacement fluid (prismasate) 400 mL/hr at 06/19/15 0355  . dialysate (PRISMASATE) 2,000 mL/hr at 06/19/15  3976    Assessment/ Plan:  Pt is a 75 y.o. yo male who was admitted on 05/31/2015 with septic shock 2/2 klebsiella bacteremia. Readmitted to the ICU 06/08/2015 with hemorrhagic shock in the setting of a right perinephric hematoma. Now s/p coil embolization.  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 Patient's Cr was notably elevated at 9.33, his baseline is thought to be around 1.7-1.9. Has bilat chest tubes r/t significant sub-q emphysema post CPR.  Initially IHD (started 06/02/15) then transitioned to CRRT d/t shock (hemorrhagic - perinephric hematoma).  Has been back on CRRT since 5/3 d/t tenuous hemodynamics and massive anasarca.    1. AKI/CKD, patient s/p left nephrectomy and now s/p partial right renal artery coil embolization 2/2 perinephric hematoma. Anuric. Weight down now a total of 17 kg and STILL has moderate anasarca but clearly much better  - On CVVHD - Continue pulling volume as tolerated (Goal -182m/hr)  - Giving phos repletion 161ml IV bid and phos stable on that  - I am not optimistic about much renal recovery here as he is now left with a partial solitary right kidney.  Will  likely be dialysis dependent               moving forward. ([facility] placed 06/17/15).  May be able to transition to IHD in next 24-48 hours.  2. Hemorrhagic shock 2/2 Acute blood loss anemia 2/2 right perinephric hematoma. S/p coil embolization by IR. s/p FFP and vit K. BPs stable off pressors.  - Had been getting FFP and pRBC per CCM. No active bleeding now. Last INR was still elevated. Will repeat  - Not getting heparin.   - received 1 unit PRBC's 5/10 Hb   3. Urosepsis Positive blood culture pan-sensitive Kleb Pneumo. Completed course of abx. Per CCM.   4. Coagulopathy with thrombocytopenia, elevated INR. Plts starting to improve. Last INR was 5/12 still prolonged. No active bleeding  5.  Bilateral chest tubes with SQ emphysema - per CCM  5. New mottling R foot. + pulses.   Abu Heavin B 06/19/2015,7:35 AM

## 2015-06-19 NOTE — Progress Notes (Signed)
[facility] Progress Note Patient Name: [name] DOB: Jan 06, 1941 MRN: [id number]   Date of Service  06/19/2015  HPI/Events of Note  Hypotensive.  eICU Interventions  Will add levophed.     Intervention Category Major Interventions: Other:  Gari Hartsell 06/19/2015, 6:56 PM

## 2015-06-19 NOTE — Progress Notes (Signed)
RT called STAT to room for pt desat.  Upon arrival to room pts SpO2 84%.  Gurgling audibly heard.  NTS performed via pt Right nare with moderate amt of thick tan secretions returned.  Pts sats immediately increased to 94% and O2 increased to 5Lpm for assistance and pt comfort.  Pt resting comfortably now with VS stable.  Rip Harbour, RN and wife at bedside.

## 2015-06-20 LAB — CBC
HEMATOCRIT: 25.2 % — AB (ref 39.0–52.0)
Hemoglobin: 7.8 g/dL — ABNORMAL LOW (ref 13.0–17.0)
MCH: 28.3 pg (ref 26.0–34.0)
MCHC: 31 g/dL (ref 30.0–36.0)
MCV: 91.3 fL (ref 78.0–100.0)
PLATELETS: 134 10*3/uL — AB (ref 150–400)
RBC: 2.76 MIL/uL — AB (ref 4.22–5.81)
RDW: 18.4 % — AB (ref 11.5–15.5)
WBC: 5.5 10*3/uL (ref 4.0–10.5)

## 2015-06-20 LAB — RENAL FUNCTION PANEL
ALBUMIN: 2.6 g/dL — AB (ref 3.5–5.0)
ANION GAP: 7 (ref 5–15)
Albumin: 2.5 g/dL — ABNORMAL LOW (ref 3.5–5.0)
Anion gap: 9 (ref 5–15)
BUN: 27 mg/dL — ABNORMAL HIGH (ref 6–20)
BUN: 27 mg/dL — AB (ref 6–20)
CALCIUM: 8.4 mg/dL — AB (ref 8.9–10.3)
CHLORIDE: 103 mmol/L (ref 101–111)
CHLORIDE: 103 mmol/L (ref 101–111)
CO2: 27 mmol/L (ref 22–32)
CO2: 26 mmol/L (ref 22–32)
CREATININE: 1.54 mg/dL — AB (ref 0.61–1.24)
CREATININE: 1.52 mg/dL — AB (ref 0.61–1.24)
Calcium: 8.6 mg/dL — ABNORMAL LOW (ref 8.9–10.3)
GFR, EST AFRICAN AMERICAN: 50 mL/min — AB (ref >60)
GFR, EST AFRICAN AMERICAN: 51 mL/min — AB (ref >60)
GFR, EST NON AFRICAN AMERICAN: 43 mL/min — AB (ref >60)
GFR, EST NON AFRICAN AMERICAN: 44 mL/min — AB (ref >60)
Glucose, Bld: 180 mg/dL — ABNORMAL HIGH (ref 65–99)
Glucose, Bld: 183 mg/dL — ABNORMAL HIGH (ref 65–99)
PHOSPHORUS: 2.8 mg/dL (ref 2.5–4.6)
POTASSIUM: 4.4 mmol/L (ref 3.5–5.1)
Phosphorus: 2.7 mg/dL (ref 2.5–4.6)
Potassium: 4.2 mmol/L (ref 3.5–5.1)
Sodium: 137 mmol/L (ref 135–145)
Sodium: 138 mmol/L (ref 135–145)

## 2015-06-20 LAB — GLUCOSE, CAPILLARY
GLUCOSE-CAPILLARY: 129 mg/dL — AB (ref 65–99)
GLUCOSE-CAPILLARY: 136 mg/dL — AB (ref 65–99)
GLUCOSE-CAPILLARY: 159 mg/dL — AB (ref 65–99)
Glucose-Capillary: 179 mg/dL — ABNORMAL HIGH (ref 65–99)
Glucose-Capillary: 133 mg/dL — ABNORMAL HIGH (ref 65–99)
Glucose-Capillary: 172 mg/dL — ABNORMAL HIGH (ref 65–99)

## 2015-06-20 LAB — MAGNESIUM: MAGNESIUM: 2.4 mg/dL (ref 1.7–2.4)

## 2015-06-20 MED ORDER — PRO-STAT SUGAR FREE PO LIQD
30.0000 mL | Freq: Two times a day (BID) | ORAL | Status: DC
  Administered 2015-06-20 – 2015-06-22 (×5): 30 mL
  Filled 2015-06-20 (×5): qty 30

## 2015-06-20 MED ORDER — ACETAMINOPHEN 160 MG/5ML PO SOLN: 650.0000 mg | Freq: Four times a day (QID) | ORAL | Status: DC | PRN

## 2015-06-20 MED ORDER — NOREPINEPHRINE BITARTRATE 1 MG/ML IV SOLN
2.0000 ug/min | INTRAVENOUS | Status: DC
  Administered 2015-06-20: 2 ug/min via INTRAVENOUS
  Administered 2015-06-21: 3 ug/min via INTRAVENOUS
  Administered 2015-06-22: 10 ug/min via INTRAVENOUS
  Administered 2015-06-22: 6 ug/min via INTRAVENOUS
  Administered 2015-06-22: 10 ug/min via INTRAVENOUS
  Filled 2015-06-20 (×5): qty 4

## 2015-06-20 MED ORDER — VITAL AF 1.2 CAL PO LIQD
1000.0000 mL | ORAL | Status: DC
  Administered 2015-06-20 – 2015-06-22 (×4): 1000 mL
  Filled 2015-06-20: qty 1000

## 2015-06-20 NOTE — Progress Notes (Signed)
PULMONARY / CRITICAL CARE MEDICINE   Name: [name] MRN: [id number] DOB: [birth year]    ADMISSION DATE:  05/31/2015  REFERRING MD:  EDP  CHIEF COMPLAINT:  Hypotension   SUBJECTIVE:  Feels weak.  Denies chest/abd pain.    VITAL SIGNS: BP 81/58 mmHg  Pulse 72  Temp(Src) 97.4 F (36.3 C) (Oral)  Resp 30  Ht 5\' 9"  (1.753 m)  Wt 279 lb 1.6 oz (126.6 kg)  BMI 41.20 kg/m2  SpO2 100%  INTAKE / OUTPUT: I/O last 3 completed shifts: In: 1856.8 [I.V.:344.8; NG/GT:630; IV Piggyback:882] Out: [id number] [Urine:5; Other:5342]  PHYSICAL EXAMINATION: General: alert Neuro: follows commands Cardiac: regular Chest: basilar crackles Abd: soft, non tender Ext: 3+ edema Skin: no rashes  LABS:  BMET  Recent Labs Lab 06/19/15 0415 06/19/15 1646 06/20/15 0420  NA 140 139 137  K 4.6 4.4 4.2  CL 103 103 103  CO2 26 25 27   BUN 29* 27* 27*  CREATININE 1.69* 1.73* 1.54*  GLUCOSE 169* 156* 180*    Electrolytes  Recent Labs Lab 06/18/15 0356  06/19/15 0415 06/19/15 1646 06/20/15 0410 06/20/15 0420  CALCIUM  --   < > 8.6* 8.2*  --  8.4*  MG 2.2  --  2.4  --  2.4  --   PHOS  --   < > 2.5 2.8  --  2.7  < > = values in this interval not displayed.  CBC  Recent Labs Lab 06/18/15 0356 06/19/15 0415 06/20/15 0410  WBC 5.3 6.3 5.5  HGB 8.1* 8.4* 7.8*  HCT 25.5* 26.6* 25.2*  PLT 67* 108* 134*    Coag's  Recent Labs Lab 06/14/15 1902 06/15/15 0414 06/17/15 0507  INR 1.75* 1.58* 1.57*    Sepsis Markers No results for input(s): LATICACIDVEN, PROCALCITON, O2SATVEN in the last 168 hours.  ABG  Recent Labs Lab 06/16/15 0945  PHART 7.372  PCO2ART 43.8  PO2ART 142.0*    Liver Enzymes  Recent Labs Lab 06/19/15 0415 06/19/15 1646 06/20/15 0420  ALBUMIN 2.7* 2.3* 2.5*    Cardiac Enzymes No results for input(s): TROPONINI, PROBNP in the last 168 hours.  Glucose  Recent Labs Lab 06/19/15 1154 06/19/15 1601 06/19/15 1949 06/20/15 0003  06/20/15 0408 06/20/15 0734  GLUCAP 140* 147* 136* 129* 179* 136*    Imaging Dg Chest Port 1 View  06/19/2015  CLINICAL DATA:  Post chest tubes removal EXAM: PORTABLE CHEST 1 VIEW COMPARISON:  06/19/2015 FINDINGS: Cardiomediastinal silhouette is stable. Bilateral chest tubes have been removed. Right IJ central line is unchanged in position. Stable NG tube position. There is no pneumothorax. No pulmonary edema. Persistent left basilar atelectasis or infiltrate. No pulmonary edema. IMPRESSION: Bilateral chest tube has been removed. No pneumothorax. Stable NG tube position. Persistent left basilar atelectasis or infiltrate. Electronically Signed   By: Lahoma Crocker M.D.   On: 06/19/2015 16:41     STUDIES:  4/26 Renal u/s >> s/p Lt nephrectomy 4/27 CT abd/pelvis >> Rt perinephric hematoma 5/02 CT abd/pelvis >> increased size of hematoma 5/08 Echo >> EF 65 to 70%  CULTURES: 4/25 Blood >> Klebsiella pneumoniae 4/27 Blood >> negative  ANTIBIOTICS: 4/28 Rocephin >> 5/10  SIGNIFICANT EVENTS: 4/25 Admitted with septic shock due to UTI. Significant ARF. 4/30 transferred out of ICU. 5/03 Cardiac arrest, anemia >> coil embolization Rt kidney by IR; PTX post-CPR  LINES/TUBES: 4/26 Rt IJ HD cath >. 5/12 5/03 ETT >> 5/13 5/03 Lt femoral CVL >>  5/03 Lt chest tube >>  5/14 5/03 Rt chest tube >> 5/14 5/12 Rt IJ tunneled HD cath >>   DISCUSSION: 75 yo male with hemorrhagic shock leading to cardiac arrest, VDRF, AKI from Rt perinephric hematoma.  He has hx of Lt renal cell carcinoma s/p Lt nephrectomy, prostate cancer s/p XRT, PE in 2008, OSA, HTN, HLD, DM, A fib.  ASSESSMENT / PLAN:  PULMONARY A: Acute hypoxic respiratory failure in setting of cardiac arrest. Pneumothorax after CPR 5/03 >> chest tubes removed 5/14. Hx of OSA. P:  Bronchial hygiene CPAP qhs Oxygen to keep SpO2 > 92% F/u CXR  CARDIOVASCULAR A: Hemorrhagic shock 2nd to Rt perinephric hematoma. Hx of A fib, HTN,  HLD. P:  Pressors to keep MAP > 65 Hold outpt coreg, tricor, lasix, niacin, accupril No coumadin for now in setting of hematoma  RENAL A:  AKI with hx of Stage III CKD. S/p Lt nephrectomy for RCC. P:  CRRT per nephrology  GASTROINTESTINAL A:  Nutrition. Dysphagia. P:  Continue tube feeds Will need speech therapy to assess swallowing Protonix for SUP  HEMATOLOGIC / ONCOLOGIC A: Acute blood loss anemia 2nd to Rt perinephric hematoma. Anemia of critical illness and chronic disease. Thrombocytopenia. P:  F/u CBC SCDs F/u Seritonin release assay from 5/10  INFECTIOUS A:  Septic shock with Klebsiella bacteremia >> completed Abx 5/10. P:  Monitor fever curve  ENDOCRINE A:  DM. P:  Continue SSI  NEUROLOGIC A:  Acute encephalopathy 2nd to sepsis, shock, respiratory failure, renal failure >> improved. Deconditioning. P:  PT  Updated pt's wife at bedside.  CC time 38 minutes.  Chesley Mires, MD [facility] Pulmonary/Critical Care 06/20/2015, 11:19 AM Pager:  [phone] After 3pm call: [phone]

## 2015-06-20 NOTE — Progress Notes (Cosign Needed)
[facility] Rounding Note  Subjective/Interval Events:  Now extubated. Denies pain. Complains of a dry mouth.   Soft BPs yesterday evening - started back on levophed.   Remains on CRRT/ neg 150/hour as tol/all 4K/no heparin/iv phos BID Tunneled dialysis catheter placed 5/12  Objective: Vital signs in last 24 hours: Temp:  [97.4 F (36.3 C)-99 F (37.2 C)] 97.4 F (36.3 C) (05/15 0732) Pulse Rate:  [36-130] 69 (05/15 0730) Resp:  [18-43] 22 (05/15 0730) BP: (77-122)/(42-88) 106/65 mmHg (05/15 0730) SpO2:  [86 %-100 %] 100 % (05/15 0730) Weight:  [279 lb 1.6 oz (126.6 kg)] 279 lb 1.6 oz (126.6 kg) (05/15 0530) Weight change: 14.1 oz (0.4 kg)  Filed Weights   06/18/15 0100 06/19/15 0600 06/20/15 0530  Weight: 290 lb 12.6 oz (131.9 kg) 278 lb 3.5 oz (126.2 kg) 279 lb 1.6 oz (126.6 kg)   Intake/Output from previous day: 05/14 0701 - 05/15 0700 In: 1470.8 [I.V.:294.8; NG/GT:630; IV Piggyback:546] Out: 3380 [Urine:5] Intake/Output this shift:   BP 106/65 mmHg  Pulse 69  Temp(Src) 97.4 F (36.3 C) (Oral)  Resp 22  Ht '5\' 9"'  (1.753 m)  Wt 279 lb 1.6 oz (126.6 kg)  BMI 41.20 kg/m2  SpO2 100%  General appearance:  Resp: Extubated and on nasal cannula. Coarse BS/ant coarse crackles, scattered exp wheeze + SQ air unchanged Regular S1S2 No S3 GI: + BS, no abd tenderness Ext: 2+ pitting edema in LE (R>L) seems to be improving Has some new mottling over knees and R foot Neuro - awakens, follows commands, tries to answer questions, speaks softly Minimal urine in foley TDC R IJ (5/12)working fine  Weight trending 06/15/15 143.7 06/16/15 136.6 06/17/15 135.5 06/18/15 131.9 06/19/15  126.2 06/20/15  126.6  Lab Results:  Recent Labs  06/19/15 0415 06/20/15 0410  WBC 6.3 5.5  HGB 8.4* 7.8*  HCT 26.6* 25.2*  PLT 108* 134*     Recent Labs  06/19/15 1646 06/20/15 0420  NA 139 137  K 4.4 4.2  CL 103 103  CO2 25 27  GLUCOSE 156* 180*  BUN 27* 27*  CREATININE 1.73* 1.54*   CALCIUM 8.2* 8.4*   PHOSPHORUS  Date/Time Value Ref Range Status  06/20/2015 04:20 AM 2.7 2.5 - 4.6 mg/dL Final  06/19/2015 04:46 PM 2.8 2.5 - 4.6 mg/dL Final  06/19/2015 04:15 AM 2.5 2.5 - 4.6 mg/dL Final    Lab Results  Component Value Date   INR 1.57* 06/17/2015   INR 1.58* 06/15/2015   INR 1.75* 06/14/2015    Studies/Results: Dg Chest Port 1 View  06/19/2015  CLINICAL DATA:  Post chest tubes removal EXAM: PORTABLE CHEST 1 VIEW COMPARISON:  06/19/2015 FINDINGS: Cardiomediastinal silhouette is stable. Bilateral chest tubes have been removed. Right IJ central line is unchanged in position. Stable NG tube position. There is no pneumothorax. No pulmonary edema. Persistent left basilar atelectasis or infiltrate. No pulmonary edema. IMPRESSION: Bilateral chest tube has been removed. No pneumothorax. Stable NG tube position. Persistent left basilar atelectasis or infiltrate. Electronically Signed   By: Lahoma Crocker M.D.   On: 06/19/2015 16:41   Dg Chest Port 1 View  06/19/2015  CLINICAL DATA:  Acute respiratory failure EXAM: PORTABLE CHEST 1 VIEW COMPARISON:  06/18/2015 FINDINGS: Cardiomegaly again noted. Stable right IJ central line. Bilateral chest tubes are unchanged in position. No pneumothorax. Stable NG tube position. Endotracheal tube has been removed. Persistent left basilar atelectasis or infiltrate. Trace right basilar atelectasis. No pulmonary edema. IMPRESSION: Stable NG tube  position. Endotracheal tube has been removed. Bilateral chest tube is unchanged in position. Persistent left basilar atelectasis or infiltrate. There is no pulmonary edema. Electronically Signed   By: Lahoma Crocker M.D.   On: 06/19/2015 09:06    Scheduled Medications: . antiseptic oral rinse  7 mL Mouth Rinse QID  . chlorhexidine gluconate (SAGE KIT)  15 mL Mouth Rinse BID  . sennosides  5 mL Oral BID   And  . docusate  100 mg Oral BID  . feeding supplement (VITAL HIGH PROTEIN)  1,000 mL Per Tube Q24H  .  insulin aspart  0-15 Units Subcutaneous Q4H  . pantoprazole sodium  40 mg Per Tube Q1200  . sodium chloride flush  10-40 mL Intracatheter Q12H  . sodium phosphate  Dextrose 5% IVPB  10 mmol Intravenous BID   Infusions Medications . sodium chloride 10 mL/hr at 06/14/15 0600  . norepinephrine (LEVOPHED) Adult infusion 2 mcg/min (06/20/15 0700)  . dialysis replacement fluid (prismasate) 300 mL/hr at 06/20/15 0431  . dialysis replacement fluid (prismasate) 400 mL/hr at 06/20/15 0651  . dialysate (PRISMASATE) 2,000 mL/hr at 06/20/15 6067    Assessment/ Plan:  Pt is a 75 y.o. yo male who was admitted on 05/31/2015 with septic shock 2/2 klebsiella bacteremia. Readmitted to the ICU 06/08/2015 with hemorrhagic shock in the setting of a right perinephric hematoma. Now s/p coil embolization.  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 Patient's Cr was notably elevated at 9.33, his baseline is thought to be around 1.7-1.9. Has bilat chest tubes r/t significant sub-q emphysema post CPR.  Initially IHD (started 06/02/15) then transitioned to CRRT d/t shock (hemorrhagic - perinephric hematoma).  Has been back on CRRT since 5/3 d/t tenuous hemodynamics and massive anasarca.    1. AKI/CKD, patient s/p left nephrectomy and now s/p partial right renal artery coil embolization 2/2 perinephric hematoma. Anuric. Weight down now a total of 17 kg. Massive anasarca improving.  - On CVVHD - Continue pulling volume as tolerated (Goal -165m/hr)  - Giving phos repletion 1104ml IV bid and phos stable on that  -  Will likely be dialysis dependent moving forward. ([place] 06/17/15).   - Transition to IHD once hemodynamic status improves.   2. Hemorrhagic shock 2/2 Acute blood loss anemia 2/2 right perinephric hematoma. S/p coil embolization by IR. s/p FFP and vit K.   - Has been getting FFP and pRBC per  CCM. No active bleeding now.  - Not getting heparin.   3. Urosepsis Positive blood culture pan-sensitive Kleb Pneumo. Completed course of abx. Per CCM.   4. Coagulopathy with thrombocytopenia, elevated INR. Plts starting to improve. Last INR was 5/12 still prolonged. No active bleeding  5. SQ emphysema - per CCM  Kaydin Labo 06/20/2015,7:38 AM

## 2015-06-20 NOTE — Progress Notes (Signed)
SLP Cancellation Note  Patient Details Name: [name] MRN: [id number] DOB: 07-Dec-1940   Cancelled treatment:       Reason Eval/Treat Not Completed: Patient not medically ready. RN says pt is more alert today but remains weak, requiring suction to clear secretions due to weak cough. Will f/u on next date.   Germain Osgood, M.A. CCC-SLP [phone]  Germain Osgood 06/20/2015, 10:07 AM

## 2015-06-20 NOTE — Evaluation (Signed)
Physical Therapy Evaluation Patient Details Name: [name] MRN: [id number] DOB: [birth year] Today's Date: 06/20/2015   History of Present Illness  Pt adm with septic shock likely due to UTI. Pt with renal failure and on CVVHD. Pt then with Rt perinephric hematoma that required embolization and pt with VDRF.PMH - recent (3/17) rt rotator cuff repair, Lt renal cell carcinoma s/p Lt nephrectomy, prostate cancer s/p XRT, PE in 2008, OSA, HTN, HLD, DM, A fib.  Clinical Impression  Pt admitted with above diagnosis and presents to PT with functional limitations due to deficits listed below (See PT problem list). Pt needs skilled PT to maximize independence and safety to allow discharge to some form of post acute rehab. Will need to see how pt progresses to determine most appropriated post acute venue.     Follow Up Recommendations CIR (possibly in the future depending on progress)    Equipment Recommendations  Other (comment) (To be assessed)    Recommendations for Other Services OT consult     Precautions / Restrictions Precautions Precautions: Other (comment);Shoulder Type of Shoulder Precautions: Pt with rotator cuff repair on 05/03/15. Per wife still wearing sling and sounds like limited to PROM. Unsure of limits. Precaution Comments: Lt femoral line, CVVHD in rt IJ Required Braces or Orthoses: Sling Restrictions Weight Bearing Restrictions: No      Mobility  Bed Mobility                  Transfers                    Ambulation/Gait                Stairs            Wheelchair Mobility    Modified Rankin (Stroke Patients Only)       Balance                                             Pertinent Vitals/Pain Pain Assessment: No/denies pain    Home Living Family/patient expects to be discharged to:: Private residence Living Arrangements: Spouse/significant other Available Help at Discharge: Family;Available 24  hours/day Type of Home: House Home Access: Stairs to enter   [employer] of Steps: 3 Home Layout: Multi-level (trilevel) Home Equipment: None      Prior Function Level of Independence: Needs assistance   Gait / Transfers Assistance Needed: amb modified independent without assistive device           Hand Dominance   Dominant Hand: Right    Extremity/Trunk Assessment   Upper Extremity Assessment: RUE deficits/detail;LUE deficits/detail RUE Deficits / Details: Pt with recent rotator cuff repair. Pt with active elbow flexion/extension. Performed passive forward flexion to ~30 degrees and also with CVVHD on Rt IJ     LUE Deficits / Details: Strength grossly 3- to 3/5. Decr shoulder ROM   Lower Extremity Assessment: RLE deficits/detail;LLE deficits/detail RLE Deficits / Details: Grossly 2-/5 LLE Deficits / Details: Limited due to femoral line. Poor quadset and dorsi/plantar flexion grossly 3-/5     Communication   Communication: No difficulties  Cognition Arousal/Alertness: Awake/alert Behavior During Therapy: WFL for tasks assessed/performed Overall Cognitive Status: Difficult to assess                      General Comments  Exercises General Exercises - Upper Extremity Shoulder Flexion: AAROM;PROM;10 reps;15 reps;Both;Supine (rt passively to ~30 degrees. Lt active assist to 80 degrees) Shoulder Horizontal ADduction: AAROM;Left;10 reps;Supine Elbow Flexion: AROM;Both;10 reps;Supine Elbow Extension: AROM;Both;10 reps;Supine General Exercises - Lower Extremity Ankle Circles/Pumps: AROM;Both;20 reps;Supine Quad Sets: AAROM;Both;10 reps;Supine Heel Slides: AAROM;Right;10 reps      Assessment/Plan    PT Assessment Patient needs continued PT services  PT Diagnosis Generalized weakness;Difficulty walking   PT Problem List Decreased strength;Decreased range of motion;Decreased activity tolerance;Decreased mobility;Obesity  PT Treatment  Interventions DME instruction;Gait training;Functional mobility training;Therapeutic activities;Therapeutic exercise;Balance training;Patient/family education   PT Goals (Current goals can be found in the Care Plan section) Acute Rehab PT Goals Patient Stated Goal: Return home PT Goal Formulation: With patient Time For Goal Achievement: 07/04/15 Potential to Achieve Goals: Fair    Frequency Min 3X/week   Barriers to discharge Inaccessible home environment Pt lives in [place] home    Co-evaluation               End of Session Equipment Utilized During Treatment: Oxygen Activity Tolerance: Patient limited by fatigue Patient left: in bed;with call bell/phone within reach;with family/visitor present           Time: 1340-1356 PT Time Calculation (min) (ACUTE ONLY): 16 min   Charges:   PT Evaluation $PT Eval Moderate Complexity: 1 Procedure     PT G Codes:        Betsey Sossamon 06-24-15, 2:36 PM [facility] PT [phone]

## 2015-06-20 NOTE — Progress Notes (Signed)
Rehab Admissions Coordinator Note:  Patient was screened by Cleatrice Burke for appropriateness for an Inpatient Acute Rehab Consult per PT recommendation.   At this time, we are recommending await further progress before determining rehab venue options. Cleatrice Burke 06/20/2015, 8:55 PM  I can be reached at [phone].

## 2015-06-20 NOTE — Procedures (Signed)
Admit: 05/31/2015 LOS: 59  8M solitary kidney with AKI on CRRT related to perinephric hemorrhage with shock.  Current CRRT Prescription: Start Date: on/off, last start 5/3? Catheter: R IJ TUnneled catheter BFR: 150-200/h Pre Blood Pump: 300 4K DFR: 2000 4K Replacement Rate: 400 4K Goal UF: 150-263m net neg/hr Anticoagulation: no heparin Clotting: ~q24h   S: Req some NE overnight for low BP Labs ok this AM, on BID Phos No UOP Tolerating UF, remains very edematous  O: 05/14 0701 - 05/15 0700 In: 1470.8 [I.V.:294.8; NG/GT:630; IV Piggyback:546] Out: 35366[Urine:5]  Filed Weights   06/18/15 0100 06/19/15 0600 06/20/15 0530  Weight: 131.9 kg (290 lb 12.6 oz) 126.2 kg (278 lb 3.5 oz) 126.6 kg (279 lb 1.6 oz)     Recent Labs Lab 06/19/15 0415 06/19/15 1646 06/20/15 0420  NA 140 139 137  K 4.6 4.4 4.2  CL 103 103 103  CO2 _0 GLUCOSE 169* 156* 180*  BUN 29* 27* 27*  CREATININE 1.69* 1.73* 1.54*  CALCIUM 8.6* 8.2* 8.4*  PHOS 2.5 2.8 2.7    Recent Labs Lab 06/14/15 1902  06/18/15 0356 06/19/15 0415 06/20/15 0410  WBC 3.5*  < > 5.3 6.3 5.5  NEUTROABS 2.9  --   --   --   --   HGB 7.5*  < > 8.1* 8.4* 7.8*  HCT 23.9*  < > 25.5* 26.6* 25.2*  MCV 91.6  < > 92.7 92.7 91.3  PLT 41*  < > 67* 108* 134*  < > = values in this interval not displayed.  Scheduled Meds: . antiseptic oral rinse  7 mL Mouth Rinse QID  . chlorhexidine gluconate (SAGE KIT)  15 mL Mouth Rinse BID  . sennosides  5 mL Oral BID   And  . docusate  100 mg Oral BID  . feeding supplement (VITAL HIGH PROTEIN)  1,000 mL Per Tube Q24H  . insulin aspart  0-15 Units Subcutaneous Q4H  . pantoprazole sodium  40 mg Per Tube Q1200  . sodium chloride flush  10-40 mL Intracatheter Q12H  . sodium phosphate  Dextrose 5% IVPB  10 mmol Intravenous BID   Continuous Infusions: . sodium chloride 10 mL/hr at 06/14/15 0600  . norepinephrine (LEVOPHED) Adult infusion Stopped (06/20/15 0800)  . dialysis  replacement fluid (prismasate) 300 mL/hr at 06/20/15 0431  . dialysis replacement fluid (prismasate) 400 mL/hr at 06/20/15 0651  . dialysate (PRISMASATE) 2,000 mL/hr at 06/20/15 0653   PRN Meds:.acetaminophen, guaiFENesin, lidocaine (PF), lidocaine-prilocaine, ondansetron, pentafluoroprop-tetrafluoroeth, sodium chloride flush  ABG    Component Value Date/Time   PHART 7.372 06/16/2015 0945   PCO2ART 43.8 06/16/2015 0945   PO2ART 142.0* 06/16/2015 0945   HCO3 25.4* 06/16/2015 0945   TCO2 27 06/16/2015 0945   ACIDBASEDEF 3.9* 06/12/2015 0535   O2SAT 99.0 06/16/2015 0945    A 1. Anuric Dialysis dependent AKI with ATN, low likelihood of recovery 1. BL SCr 1.7-1.9  2. Hemorrhagic Shock 2/2 ABLA 2/2 R perinephric hematoma s/p coil embolization 3. UTI with K Pneumoniae s/p ABX  4. Hx/o L nephrectomy for RCC 5. AFib 6. Hx/o DVT/PE 7. Hypervolema / Anasarca 8. Hypophosphatemia 2/2 CRRT  P 1. Cont CRRT, pull fluid as able 2. Inc Pre fluid to reduce clotting  RPearson Grippe MD [facility] pgr [phone]

## 2015-06-20 NOTE — Progress Notes (Signed)
Nutrition Follow-up  DOCUMENTATION CODES:   Obesity unspecified  INTERVENTION:  -Change tube feeding to Vital 1.2 via NGT @ goal rate of 80 ml/hr with prostat BID to provide 2504 kcals and 174 g of protein(meets>/= 95% needs).   NUTRITION DIAGNOSIS:   Inadequate oral intake related to inability to eat as evidenced by NPO status.  Ongoing    GOAL:   Patient will meet greater than or equal to 90% of their needs  Meeting  MONITOR:   Labs, Weight trends, I & O's, TF tolerance, Skin  ASSESSMENT:   75 y.o. male with PMH as outlined below including prostate CA s/p radiation 2011 and RCC s/p left nephrectomy. He was seen in ED 4/22 for clogged catheter which was irrigated and replaced. He apparently has had hematuria for quite some time which has been attributed to scarring from prior radiation.  4/25 Admitted with septic shock due to UTI. Significant ARF. 5/03 Cardiac arrest 5/13 pt extubated  Pt off vent. Re-estimated needs.  Pt on CRRT.  Weight is trending down since 5/9. Current weight of 279 lbs is 13 lbs higher since admission. Pt is +4 L since admission.   Speech to evaluate swallow once pt is medically ready.   Change tube feeding to Vital 1.2 @ 80 ml/hr.  Labs reviewed; CBGs 117-179, BUN 27, Ca 8.4, creat 1.54, Ca 8.4, GFR 50. Meds reviewed.   Diet Order:  Diet NPO time specified  Skin:  Reviewed, no issues  Last BM:  5/14  Height:   Ht Readings from Last 1 Encounters:  06/08/15 5\' 9"  (1.753 m)    Weight:   Wt Readings from Last 1 Encounters:  06/20/15 279 lb 1.6 oz (126.6 kg)    Ideal Body Weight:  72.7 kg  BMI:  Body mass index is 41.2 kg/(m^2).  Estimated Nutritional Needs:   Kcal:  2319-2512  Protein:  181  Fluid:  2 L  EDUCATION NEEDS:   No education needs identified at this time  Geoffery Lyons, [place] Dietetic Intern Pager [phone]

## 2015-06-21 ENCOUNTER — Inpatient Hospital Stay (HOSPITAL_COMMUNITY): Payer: BLUE CROSS/BLUE SHIELD

## 2015-06-21 DIAGNOSIS — J9601 Acute respiratory failure with hypoxia: Secondary | ICD-10-CM | POA: Diagnosis present

## 2015-06-21 LAB — POCT I-STAT 3, ART BLOOD GAS (G3+)
ACID-BASE EXCESS: 2 mmol/L (ref 0.0–2.0)
ACID-BASE EXCESS: 5 mmol/L — AB (ref 0.0–2.0)
BICARBONATE: 25.6 meq/L — AB (ref 20.0–24.0)
Bicarbonate: 29.4 mEq/L — ABNORMAL HIGH (ref 20.0–24.0)
O2 SAT: 97 %
O2 Saturation: 100 %
PH ART: 7.474 — AB (ref 7.350–7.450)
PH ART: 7.458 — AB (ref 7.350–7.450)
TCO2: 27 mmol/L (ref 0–100)
TCO2: 31 mmol/L (ref 0–100)
pCO2 arterial: 34.7 mmHg — ABNORMAL LOW (ref 35.0–45.0)
pCO2 arterial: 41.5 mmHg (ref 35.0–45.0)
pO2, Arterial: 81 mmHg (ref 80.0–100.0)
pO2, Arterial: 529 mmHg — ABNORMAL HIGH (ref 80.0–100.0)

## 2015-06-21 LAB — CBC
HCT: 25.7 % — ABNORMAL LOW (ref 39.0–52.0)
Hemoglobin: 8.4 g/dL — ABNORMAL LOW (ref 13.0–17.0)
MCH: 30 pg (ref 26.0–34.0)
MCHC: 32.7 g/dL (ref 30.0–36.0)
MCV: 91.8 fL (ref 78.0–100.0)
PLATELETS: 177 10*3/uL (ref 150–400)
RBC: 2.8 MIL/uL — ABNORMAL LOW (ref 4.22–5.81)
RDW: 18.4 % — AB (ref 11.5–15.5)
WBC: 6.1 10*3/uL (ref 4.0–10.5)

## 2015-06-21 LAB — GLUCOSE, CAPILLARY
GLUCOSE-CAPILLARY: 169 mg/dL — AB (ref 65–99)
GLUCOSE-CAPILLARY: 224 mg/dL — AB (ref 65–99)
Glucose-Capillary: 124 mg/dL — ABNORMAL HIGH (ref 65–99)
Glucose-Capillary: 160 mg/dL — ABNORMAL HIGH (ref 65–99)
Glucose-Capillary: 169 mg/dL — ABNORMAL HIGH (ref 65–99)
Glucose-Capillary: 182 mg/dL — ABNORMAL HIGH (ref 65–99)
Glucose-Capillary: 204 mg/dL — ABNORMAL HIGH (ref 65–99)

## 2015-06-21 LAB — RENAL FUNCTION PANEL
ALBUMIN: 2.5 g/dL — AB (ref 3.5–5.0)
ALBUMIN: 2.6 g/dL — AB (ref 3.5–5.0)
ANION GAP: 8 (ref 5–15)
Anion gap: 11 (ref 5–15)
BUN: 30 mg/dL — AB (ref 6–20)
BUN: 31 mg/dL — AB (ref 6–20)
CALCIUM: 8.3 mg/dL — AB (ref 8.9–10.3)
CO2: 26 mmol/L (ref 22–32)
CO2: 27 mmol/L (ref 22–32)
CREATININE: 1.44 mg/dL — AB (ref 0.61–1.24)
Calcium: 8.1 mg/dL — ABNORMAL LOW (ref 8.9–10.3)
Chloride: 101 mmol/L (ref 101–111)
Chloride: 100 mmol/L — ABNORMAL LOW (ref 101–111)
Creatinine, Ser: 1.34 mg/dL — ABNORMAL HIGH (ref 0.61–1.24)
GFR calc Af Amer: 54 mL/min — ABNORMAL LOW (ref >60)
GFR calc Af Amer: 59 mL/min — ABNORMAL LOW (ref >60)
GFR calc non Af Amer: 51 mL/min — ABNORMAL LOW (ref >60)
GFR, EST NON AFRICAN AMERICAN: 47 mL/min — AB (ref >60)
GLUCOSE: 208 mg/dL — AB (ref 65–99)
Glucose, Bld: 193 mg/dL — ABNORMAL HIGH (ref 65–99)
PHOSPHORUS: 2.2 mg/dL — AB (ref 2.5–4.6)
PHOSPHORUS: 2.4 mg/dL — AB (ref 2.5–4.6)
POTASSIUM: 4.4 mmol/L (ref 3.5–5.1)
Potassium: 4.3 mmol/L (ref 3.5–5.1)
SODIUM: 135 mmol/L (ref 135–145)
Sodium: 138 mmol/L (ref 135–145)

## 2015-06-21 LAB — CORTISOL: Cortisol, Plasma: 17.3 ug/dL

## 2015-06-21 LAB — MAGNESIUM: Magnesium: 2.4 mg/dL (ref 1.7–2.4)

## 2015-06-21 MED ORDER — FENTANYL CITRATE (PF) 100 MCG/2ML IJ SOLN
INTRAMUSCULAR | Status: AC
  Filled 2015-06-21: qty 2

## 2015-06-21 MED ORDER — FENTANYL CITRATE (PF) 100 MCG/2ML IJ SOLN
200.0000 ug | Freq: Once | INTRAMUSCULAR | Status: AC
  Administered 2015-06-21: 200 ug via INTRAVENOUS

## 2015-06-21 MED ORDER — SODIUM CHLORIDE 0.9 % IV SOLN
25.0000 ug/h | INTRAVENOUS | Status: DC
  Administered 2015-06-21: 50 ug/h via INTRAVENOUS
  Administered 2015-06-22 (×2): 200 ug/h via INTRAVENOUS
  Administered 2015-06-23: 150 ug/h via INTRAVENOUS
  Administered 2015-06-24 – 2015-06-25 (×2): 100 ug/h via INTRAVENOUS
  Administered 2015-06-27 – 2015-07-01 (×3): 50 ug/h via INTRAVENOUS
  Administered 2015-07-02: 25 ug/h via INTRAVENOUS
  Filled 2015-06-21 (×9): qty 50

## 2015-06-21 MED ORDER — FENTANYL CITRATE (PF) 100 MCG/2ML IJ SOLN: 50.0000 ug | Freq: Once | INTRAMUSCULAR | Status: DC

## 2015-06-21 MED ORDER — ETOMIDATE 2 MG/ML IV SOLN
20.0000 mg | Freq: Once | INTRAVENOUS | Status: AC
  Administered 2015-06-21: 20 mg via INTRAVENOUS

## 2015-06-21 MED ORDER — VANCOMYCIN HCL IN DEXTROSE 1-5 GM/200ML-% IV SOLN
1000.0000 mg | INTRAVENOUS | Status: DC
  Filled 2015-06-21: qty 200

## 2015-06-21 MED ORDER — ALBUTEROL SULFATE (2.5 MG/3ML) 0.083% IN NEBU
2.5000 mg | INHALATION_SOLUTION | Freq: Two times a day (BID) | RESPIRATORY_TRACT | Status: DC
  Administered 2015-06-21 – 2015-06-22 (×2): 2.5 mg via RESPIRATORY_TRACT
  Filled 2015-06-21 (×2): qty 3

## 2015-06-21 MED ORDER — ATROPINE SULFATE 1 MG/10ML IJ SOSY
PREFILLED_SYRINGE | INTRAMUSCULAR | Status: AC
  Filled 2015-06-21: qty 10

## 2015-06-21 MED ORDER — MIDAZOLAM HCL 2 MG/2ML IJ SOLN: 1.0000 mg | INTRAMUSCULAR | Status: DC | PRN

## 2015-06-21 MED ORDER — MIDAZOLAM HCL 2 MG/2ML IJ SOLN
INTRAMUSCULAR | Status: AC
  Filled 2015-06-21: qty 2

## 2015-06-21 MED ORDER — DEXTROSE 5 % IV SOLN
2.0000 g | Freq: Two times a day (BID) | INTRAVENOUS | Status: DC
  Administered 2015-06-22: 2 g via INTRAVENOUS
  Filled 2015-06-21 (×3): qty 2

## 2015-06-21 MED ORDER — VANCOMYCIN HCL 10 G IV SOLR
2000.0000 mg | Freq: Once | INTRAVENOUS | Status: AC
  Administered 2015-06-21: 2000 mg via INTRAVENOUS
  Filled 2015-06-21: qty 2000

## 2015-06-21 MED ORDER — FENTANYL BOLUS VIA INFUSION
25.0000 ug | INTRAVENOUS | Status: DC | PRN
  Administered 2015-07-01: 25 ug via INTRAVENOUS
  Filled 2015-06-21: qty 25

## 2015-06-21 MED ORDER — MIDAZOLAM HCL 2 MG/2ML IJ SOLN
1.0000 mg | INTRAMUSCULAR | Status: DC | PRN
  Administered 2015-06-21: 1 mg via INTRAVENOUS
  Filled 2015-06-21: qty 2

## 2015-06-21 MED ORDER — ACETYLCYSTEINE 20 % IN SOLN
4.0000 mL | Freq: Two times a day (BID) | RESPIRATORY_TRACT | Status: DC
  Administered 2015-06-21 – 2015-06-22 (×2): 4 mL via RESPIRATORY_TRACT
  Filled 2015-06-21 (×2): qty 4

## 2015-06-21 MED ORDER — MIDAZOLAM HCL 2 MG/2ML IJ SOLN
2.0000 mg | Freq: Once | INTRAMUSCULAR | Status: AC
  Administered 2015-06-21: 2 mg via INTRAVENOUS

## 2015-06-21 NOTE — Procedures (Signed)
Admit: 05/31/2015 LOS: 22  4M solitary kidney with AKI on CRRT related to perinephric hemorrhage with shock.  Current CRRT Prescription: Start Date: on/off, last start 5/3? Catheter: R IJ TUnneled catheter BFR: 150-200/h Pre Blood Pump: 300 4K DFR: 2000 4K Replacement Rate: 400 4K Goal UF: 150-226m net neg/hr Anticoagulation: no heparin Clotting: ~q24h   S: No UOP overnight -- foley out On low dose NE Net negative 2.6L Weight down 47lb in last 7d, similar to admit weight No c/o this AM  O: 05/15 0701 - 05/16 0700 In: 2489.4 [I.V.:260.4; NG/GT:1725; IV Piggyback:504] Out: 5123   Filed Weights   06/19/15 0600 06/20/15 0530 06/21/15 0500  Weight: 126.2 kg (278 lb 3.5 oz) 126.6 kg (279 lb 1.6 oz) 122.4 kg (269 lb 13.5 oz)     Recent Labs Lab 06/20/15 0420 06/20/15 1545 06/21/15 0425  NA 137 138 138  K 4.2 4.4 4.3  CL 103 103 101  CO2 '27 26 26  ' GLUCOSE 180* 183* 193*  BUN 27* 27* 30*  CREATININE 1.54* 1.52* 1.44*  CALCIUM 8.4* 8.6* 8.3*  PHOS 2.7 2.8 2.2*    Recent Labs Lab 06/14/15 1902  06/19/15 0415 06/20/15 0410 06/21/15 0425  WBC 3.5*  < > 6.3 5.5 6.1  NEUTROABS 2.9  --   --   --   --   HGB 7.5*  < > 8.4* 7.8* 8.4*  HCT 23.9*  < > 26.6* 25.2* 25.7*  MCV 91.6  < > 92.7 91.3 91.8  PLT 41*  < > 108* 134* 177  < > = values in this interval not displayed.  Scheduled Meds: . antiseptic oral rinse  7 mL Mouth Rinse QID  . chlorhexidine gluconate (SAGE KIT)  15 mL Mouth Rinse BID  . sennosides  5 mL Oral BID   And  . docusate  100 mg Oral BID  . feeding supplement (PRO-STAT SUGAR FREE 64)  30 mL Per Tube BID  . insulin aspart  0-15 Units Subcutaneous Q4H  . pantoprazole sodium  40 mg Per Tube Q1200  . sodium chloride flush  10-40 mL Intracatheter Q12H  . sodium phosphate  Dextrose 5% IVPB  10 mmol Intravenous BID   Continuous Infusions: . sodium chloride 10 mL/hr at 06/14/15 0600  . feeding supplement (VITAL AF 1.2 CAL) 1,000 mL (06/21/15 0330)   . norepinephrine (LEVOPHED) Adult infusion 3 mcg/min (06/21/15 0700)  . dialysis replacement fluid (prismasate) 1,000 mL/hr at 06/21/15 0417  . dialysis replacement fluid (prismasate) 400 mL/hr at 06/20/15 2013  . dialysate (PRISMASATE) 2,000 mL/hr at 06/21/15 0641   PRN Meds:.acetaminophen (TYLENOL) oral liquid 160 mg/5 mL, guaiFENesin, lidocaine (PF), lidocaine-prilocaine, ondansetron, pentafluoroprop-tetrafluoroeth, sodium chloride flush  ABG    Component Value Date/Time   PHART 7.372 06/16/2015 0945   PCO2ART 43.8 06/16/2015 0945   PO2ART 142.0* 06/16/2015 0945   HCO3 25.4* 06/16/2015 0945   TCO2 27 06/16/2015 0945   ACIDBASEDEF 3.9* 06/12/2015 0535   O2SAT 99.0 06/16/2015 0945    A 1. Anuric Dialysis dependent AKI with ATN, low likelihood of recovery 1. BL SCr 1.7-1.9  2. Hemorrhagic Shock 2/2 ABLA 2/2 R perinephric hematoma s/p coil embolization 3. UTI with K Pneumoniae s/p ABX, completed 5/10 4. Hx/o L nephrectomy for RCC 5. AFib 6. Hx/o DVT/PE 7. Hypervolema / Anasarca 8. Hypophosphatemia 2/2 CRRT 9. HTN 10. Anemia 11.  DM2   P 1. Cont CRRT, pull fluid as able 2. Wouldn't push pressors to inc UF 3. Perhaps can transition  in next [place], MD [place] pgr [phone]

## 2015-06-21 NOTE — Evaluation (Signed)
Clinical/Bedside Swallow Evaluation Patient Details  Name: [name] MRN: [id number] Date of Birth: [birth year]  Today's Date: 06/21/2015 Time: SLP Start Time (ACUTE ONLY): [id number] SLP Stop Time (ACUTE ONLY): 1405 SLP Time Calculation (min) (ACUTE ONLY): 10 min  Past Medical History:  Past Medical History  Diagnosis Date  . Obesity   . Phlebitis     Lower extermity  . Pulmonary emboli ([place]) 2008    submassive, saddle  . Prostate cancer ([place]) 07/2009  . Sleep apnea     on CPAP  . Hx of echocardiogram 12/04/2010    Normal EF >55% no significant valve disease  . History of stress test 06/27/2009    Low risk and EF of approximately 50%  . DVT (deep venous thrombosis) ([place])   . Chronic kidney disease, stage 3     baseline creatinine ~1.4  . HLD (hyperlipidemia)   . HTN (hypertension)   . Dysrhythmia     A fib  . Diabetes mellitus ([place])     diet controlled  . History of hiatal hernia    Past Surgical History:  Past Surgical History  Procedure Laterality Date  . Nephrectomy  1999    for CA  . Cholecystectomy  1999  . Transurethral resection of bladder tumor N/A 03/04/2013    Procedure: CYSTOSCOPY WITH RIGHT RETROGRADE PYELOGRAM AND BLADDER BIOPSY /CLOT EVACUATION/ BIOPSY PROSTATIC URETHRA WITH FULGERATION ;  Surgeon: Molli Hazard, MD;  Location: [facility];  Service: Urology;  Laterality: N/A;  . Cystoscopy w/ retrogrades N/A 04/08/2013    Procedure: CYSTOSCOPY WITH CLOT EVACUATION;  Surgeon: Molli Hazard, MD;  Location: [facility];  Service: Urology;  Laterality: N/A;  . Left shoulder repair    . Shoulder open rotator cuff repair Right 05/03/2015    Procedure: RIGHT SHOULDER ROTATOR CUFF REPAIR OPEN WITH GRAFT AND ANCHOR ;  Surgeon: Latanya Maudlin, MD;  Location: [facility];  Service: Orthopedics;  Laterality: Right;   HPI:  76 yo male with hemorrhagic shock leading to cardiac arrest, VDRF, AKI from Rt perinephric hematoma. He was intubated 5/03-5/13. He has hx of Lt renal  cell carcinoma s/p Lt nephrectomy, prostate cancer s/p XRT, PE in 2008, OSA, HTN, HLD, DM, A fib.   Assessment / Plan / Recommendation Clinical Impression  Pt is aphonic with generalized weakness. Ice chip trials lead to immediate coughing that is weak and likely not effective at clearing possible penetrates/aspirates. Education provided about changes in vocal quality/swallowing after intubation, which are typically acute and reversible in nature. Will continue to monitor and progress as overall strength improves.    Aspiration Risk  Severe aspiration risk    Diet Recommendation NPO;Alternative means - temporary   Medication Administration: Via alternative means    Other  Recommendations Oral Care Recommendations: Oral care QID   Follow up Recommendations   (tba)    Frequency and Duration min 2x/week  2 weeks       Prognosis Prognosis for Safe Diet Advancement: Good      Swallow Study   General HPI: 75 yo male with hemorrhagic shock leading to cardiac arrest, VDRF, AKI from Rt perinephric hematoma. He was intubated 5/03-5/13. He has hx of Lt renal cell carcinoma s/p Lt nephrectomy, prostate cancer s/p XRT, PE in 2008, OSA, HTN, HLD, DM, A fib. Type of Study: Bedside Swallow Evaluation Previous Swallow Assessment: none in chart Diet Prior to this Study: NPO;NG Tube Temperature Spikes Noted: No Respiratory Status: Nasal cannula History of Recent Intubation: Yes  Length of Intubations (days): 10 days Date extubated: 06/18/15 Behavior/Cognition: Alert;Cooperative;Pleasant mood Oral Cavity Assessment: Within Functional Limits Oral Care Completed by SLP: No Oral Cavity - Dentition: Adequate natural dentition Patient Positioning: Upright in bed Baseline Vocal Quality: Aphonic Volitional Cough: Weak Volitional Swallow: Able to elicit    Oral/Motor/Sensory Function Overall Oral Motor/Sensory Function: Generalized oral weakness   Ice Chips Ice chips: Impaired Presentation:  Spoon Pharyngeal Phase Impairments: Suspected delayed Swallow;Decreased hyoid-laryngeal movement;Cough - Immediate   Thin Liquid Thin Liquid: Not tested    Nectar Thick Nectar Thick Liquid: Not tested   Honey Thick Honey Thick Liquid: Not tested   Puree Puree: Not tested   Solid   GO   Solid: Not tested       Germain Osgood, M.A. CCC-SLP [phone]  Germain Osgood 06/21/2015,2:17 PM

## 2015-06-21 NOTE — Procedures (Signed)
Bronchoscopy Procedure Note - no BS left [name] [id number] [birth year]  Procedure: Bronchoscopy Indications: Diagnostic evaluation of the airways, Obtain specimens for culture and/or other diagnostic studies and Remove secretions  Procedure Details Consent: Unable to obtain consent because of emergent medical necessity. Time Out: Verified patient identification, verified procedure, site/side was marked, verified correct patient position, special equipment/implants available, medications/allergies/relevent history reviewed, required imaging and test results available.  Performed  In preparation for procedure, patient was given 100% FiO2 and bronchoscope lubricated. Sedation: Etomidate  Airway entered and the following bronchi were examined: RUL, RML, RLL, LUL, LLL and Bronchi.   Procedures performed: Brushings performed - no Bronchoscope removed.    Evaluation Hemodynamic Status: BP stable throughout; O2 sats: stable throughout Patient's Current Condition: stable Specimens:  Sent purulent fluid Complications: No apparent complications Patient did tolerate procedure well.   [name]. 06/21/2015   1. Left main obstructed pus thick, suctioned clear 2. ett at rt maim orifice 3. BAL lingula pus thick  Lavon Paganini. Titus Mould, MD, [place] Pgr: [facility]

## 2015-06-21 NOTE — Progress Notes (Signed)
PULMONARY / CRITICAL CARE MEDICINE   Name: [name] MRN: [id number] DOB: 1940-03-22    ADMISSION DATE:  05/31/2015  REFERRING MD:  EDP  CHIEF COMPLAINT:  Hypotension   SUBJECTIVE:  More alert.  Feels hungry.  VITAL SIGNS: BP 109/65 mmHg  Pulse 70  Temp(Src) 97.5 F (36.4 C) (Oral)  Resp 26  Ht 5\' 9"  (1.753 m)  Wt 269 lb 13.5 oz (122.4 kg)  BMI 39.83 kg/m2  SpO2 100%  INTAKE / OUTPUT: I/O last 3 completed shifts: In: 3386.2 [I.V.:435.2; NG/GT:2195; IV Piggyback:756] Out: [id number] [Urine:5; Other:7334]  PHYSICAL EXAMINATION: General: alert Neuro: follows commands Cardiac: regular Chest: better air movement, faint crackles  Abd: soft, non tender Ext: 1+ edema Skin: no rashes  LABS:  BMET  Recent Labs Lab 06/20/15 0420 06/20/15 1545 06/21/15 0425  NA 137 138 138  K 4.2 4.4 4.3  CL 103 103 101  CO2 27 26 26   BUN 27* 27* 30*  CREATININE 1.54* 1.52* 1.44*  GLUCOSE 180* 183* 193*    Electrolytes  Recent Labs Lab 06/19/15 0415  06/20/15 0410 06/20/15 0420 06/20/15 1545 06/21/15 0425  CALCIUM 8.6*  < >  --  8.4* 8.6* 8.3*  MG 2.4  --  2.4  --   --  2.4  PHOS 2.5  < >  --  2.7 2.8 2.2*  < > = values in this interval not displayed.  CBC  Recent Labs Lab 06/19/15 0415 06/20/15 0410 06/21/15 0425  WBC 6.3 5.5 6.1  HGB 8.4* 7.8* 8.4*  HCT 26.6* 25.2* 25.7*  PLT 108* 134* 177    Coag's  Recent Labs Lab 06/14/15 1902 06/15/15 0414 06/17/15 0507  INR 1.75* 1.58* 1.57*    Sepsis Markers No results for input(s): LATICACIDVEN, PROCALCITON, O2SATVEN in the last 168 hours.  ABG  Recent Labs Lab 06/16/15 0945  PHART 7.372  PCO2ART 43.8  PO2ART 142.0*    Liver Enzymes  Recent Labs Lab 06/20/15 0420 06/20/15 1545 06/21/15 0425  ALBUMIN 2.5* 2.6* 2.5*    Cardiac Enzymes No results for input(s): TROPONINI, PROBNP in the last 168 hours.  Glucose  Recent Labs Lab 06/20/15 0734 06/20/15 1146 06/20/15 1611 06/20/15 2005  06/20/15 2332 06/21/15 0325  GLUCAP 136* 159* 133* 172* 160* 182*    Imaging Dg Chest Port 1 View  06/21/2015  CLINICAL DATA:  Respiratory failure, septic shock, acute renal failure. EXAM: PORTABLE CHEST 1 VIEW COMPARISON:  Portable chest x-ray of Jun 19, 2015. FINDINGS: The lungs are slightly better inflated today. Persistent retrocardiac density on the left is demonstrated. There is minimal increased density at the right lung base which has improved. The cardiac silhouette remains enlarged. The pulmonary vascularity is not engorged. There is no large pleural effusion and there is no pneumothorax. The dual-lumen dialysis type catheter tip projects over the midportion of the SVC. The feeding tube tip projects below the inferior margin of the image. IMPRESSION: Improved aeration and decreased interstitial edema in both lungs. Persistent left lower lobe atelectasis or pneumonia. Minimal subsegmental atelectasis at the right lung base. No pneumothorax. Electronically Signed   By: David  Martinique M.D.   On: 06/21/2015 07:19     STUDIES:  4/26 Renal u/s >> s/p Lt nephrectomy 4/27 CT abd/pelvis >> Rt perinephric hematoma 5/02 CT abd/pelvis >> increased size of hematoma 5/08 Echo >> EF 65 to 70%  CULTURES: 4/25 Blood >> Klebsiella pneumoniae 4/27 Blood >> negative  ANTIBIOTICS: 4/28 Rocephin >> 5/10  SIGNIFICANT EVENTS: 4/25  Admitted with septic shock due to UTI. Significant ARF. 4/30 transferred out of ICU. 5/03 Cardiac arrest, anemia >> coil embolization Rt kidney by IR; PTX post-CPR  LINES/TUBES: 4/26 Rt IJ HD cath >. 5/12 5/03 ETT >> 5/13 5/03 Lt femoral CVL >>  5/03 Lt chest tube >> 5/14 5/03 Rt chest tube >> 5/14 5/12 Rt IJ tunneled HD cath >>   DISCUSSION: 75 yo male with hemorrhagic shock leading to cardiac arrest, VDRF, AKI from Rt perinephric hematoma.  He has hx of Lt renal cell carcinoma s/p Lt nephrectomy, prostate cancer s/p XRT, PE in 2008, OSA, HTN, HLD, DM, A  fib.  ASSESSMENT / PLAN:  PULMONARY A: Acute hypoxic respiratory failure in setting of cardiac arrest. Pneumothorax after CPR 5/03 >> chest tubes removed 5/14. Hx of OSA. P:  Bronchial hygiene CPAP qhs Oxygen to keep SpO2 > 92% F/u CXR intermittently  CARDIOVASCULAR A: Hemorrhagic shock 2nd to Rt perinephric hematoma. Hx of A fib, HTN, HLD. P:  Pressors to keep MAP > 65 Hold outpt coreg, tricor, lasix, niacin, accupril No coumadin for now in setting of hematoma  RENAL A:  AKI with hx of Stage III CKD. S/p Lt nephrectomy for RCC. P:  CRRT per nephrology  GASTROINTESTINAL A:  Nutrition. Dysphagia. P:  Continue tube feeds Speech therapy to assess swallowing Protonix for SUP  HEMATOLOGIC / ONCOLOGIC A: Acute blood loss anemia 2nd to Rt perinephric hematoma. Anemia of critical illness and chronic disease. Thrombocytopenia. P:  F/u CBC SCDs F/u Seritonin release assay from 5/10  INFECTIOUS A:  Septic shock with Klebsiella bacteremia >> completed Abx 5/10. P:  Monitor fever curve  ENDOCRINE A:  DM. P:  Continue SSI  NEUROLOGIC A:  Acute encephalopathy 2nd to sepsis, shock, respiratory failure, renal failure >> improved. Deconditioning. P:  PT  Updated pt's wife at bedside.   Chesley Mires, MD [facility] 06/21/2015, 9:04 AM Pager:  [phone] After 3pm call: [phone]

## 2015-06-21 NOTE — Progress Notes (Signed)
PULMONARY / CRITICAL CARE MEDICINE   Name: JLON QUERRY MRN: [id number] DOB: May 25, 1940    ADMISSION DATE:  05/31/2015  REFERRING MD:  EDP  CHIEF COMPLAINT:  Hypotension   SUBJECTIVE:  Lethargic intermittent, paradoxical breathing  VITAL SIGNS: BP 86/65 mmHg  Pulse 73  Temp(Src) 98.4 F (36.9 C) (Oral)  Resp 20  Ht 5\' 9"  (1.753 m)  Wt 122.4 kg (269 lb 13.5 oz)  BMI 39.83 kg/m2  SpO2 100%  INTAKE / OUTPUT: I/O last 3 completed shifts: In: 4018.3 [I.V.:483.3; NG/GT:2765; IV Piggyback:770] Out: [id number] [Other:7630]  PHYSICAL EXAMINATION: General: alert intrmittent Neuro: follows commands kinda, moves upper ext Cardiac: s1 s2 IRR Chest: ronchi diffuse, low left, paradoxical breathing  Abd: soft, non tender Ext: 1+ edema Skin: no rashes  LABS:  BMET  Recent Labs Lab 06/20/15 1545 06/21/15 0425 06/21/15 1600  NA 138 138 135  K 4.4 4.3 4.4  CL 103 101 100*  CO2 26 26 27   BUN 27* 30* 31*  CREATININE 1.52* 1.44* 1.34*  GLUCOSE 183* 193* 208*    Electrolytes  Recent Labs Lab 06/19/15 0415  06/20/15 0410  06/20/15 1545 06/21/15 0425 06/21/15 1600  CALCIUM 8.6*  < >  --   < > 8.6* 8.3* 8.1*  MG 2.4  --  2.4  --   --  2.4  --   PHOS 2.5  < >  --   < > 2.8 2.2* 2.4*  < > = values in this interval not displayed.  CBC  Recent Labs Lab 06/19/15 0415 06/20/15 0410 06/21/15 0425  WBC 6.3 5.5 6.1  HGB 8.4* 7.8* 8.4*  HCT 26.6* 25.2* 25.7*  PLT 108* 134* 177    Coag's  Recent Labs Lab 06/15/15 0414 06/17/15 0507  INR 1.58* 1.57*    Sepsis Markers No results for input(s): LATICACIDVEN, PROCALCITON, O2SATVEN in the last 168 hours.  ABG  Recent Labs Lab 06/16/15 0945 06/21/15 1947  PHART 7.372 7.474*  PCO2ART 43.8 34.7*  PO2ART 142.0* 81.0    Liver Enzymes  Recent Labs Lab 06/20/15 1545 06/21/15 0425 06/21/15 1600  ALBUMIN 2.6* 2.5* 2.6*    Cardiac Enzymes No results for input(s): TROPONINI, PROBNP in the last 168  hours.  Glucose  Recent Labs Lab 06/20/15 2332 06/21/15 0325 06/21/15 0815 06/21/15 1207 06/21/15 1600 06/21/15 1939  GLUCAP 160* 182* 169* 169* 204* 124*    Imaging Dg Chest Port 1 View  06/21/2015  CLINICAL DATA:  Respiratory failure, septic shock, acute renal failure. EXAM: PORTABLE CHEST 1 VIEW COMPARISON:  Portable chest x-ray of Jun 19, 2015. FINDINGS: The lungs are slightly better inflated today. Persistent retrocardiac density on the left is demonstrated. There is minimal increased density at the right lung base which has improved. The cardiac silhouette remains enlarged. The pulmonary vascularity is not engorged. There is no large pleural effusion and there is no pneumothorax. The dual-lumen dialysis type catheter tip projects over the midportion of the SVC. The feeding tube tip projects below the inferior margin of the image. IMPRESSION: Improved aeration and decreased interstitial edema in both lungs. Persistent left lower lobe atelectasis or pneumonia. Minimal subsegmental atelectasis at the right lung base. No pneumothorax. Electronically Signed   By: David  Martinique M.D.   On: 06/21/2015 07:19     STUDIES:  4/26 Renal u/s >> s/p Lt nephrectomy 4/27 CT abd/pelvis >> Rt perinephric hematoma 5/02 CT abd/pelvis >> increased size of hematoma 5/08 Echo >> EF 65 to 70%  CULTURES: 4/25  Blood >> Klebsiella pneumoniae 4/27 Blood >> negative 5/16 bronch bal>>>  ANTIBIOTICS: 4/28 Rocephin >> 5/10 5/16 ceftaz>>> 5/16 vabnc>>>  SIGNIFICANT EVENTS: 4/25 Admitted with septic shock due to UTI. Significant ARF. 4/30 transferred out of ICU. 5/03 Cardiac arrest, anemia >> coil embolization Rt kidney by IR; PTX post-CPR 5/16- reintubated  LINES/TUBES: 4/26 Rt IJ HD cath >. 5/12 5/03 ETT >> 5/13: ETT 5/16>>> 5/03 Lt femoral CVL >>  5/03 Lt chest tube >> 5/14 5/03 Rt chest tube >> 5/14 5/12 Rt IJ tunneled HD cath >>   DISCUSSION: 75 yo male with hemorrhagic shock leading to  cardiac arrest, VDRF, AKI from Rt perinephric hematoma.  He has hx of Lt renal cell carcinoma s/p Lt nephrectomy, prostate cancer s/p XRT, PE in 2008, OSA, HTN, HLD, DM, A fib.  ASSESSMENT / PLAN:  PULMONARY A: Acute hypoxic respiratory failure in setting of cardiac arrest. Pneumothorax after CPR 5/03 >> chest tubes removed 5/14. Hx of OSA. hcap Re occurrent resp failure 5/16 P:  Requires reintubation stat,. D/w wife Add some peep, obstruction on bornch, add mucomysts x 4 doses Stat pcxr abg to follow 8 cc/kg Consider bedside trach Chest pt add to left  CARDIOVASCULAR A: Hemorrhagic shock 2nd to Rt perinephric hematoma. Hx of A fib, HTN, HLD. P:  Pressors to keep MAP > 60, levophed May need vaso re add Hold outpt coreg, tricor, lasix, niacin, accupril No coumadin for now in setting of hematoma Repeat cortisol  RENAL A:  AKI with hx of Stage III CKD. S/p Lt nephrectomy for RCC. P:  CRRT per nephrology, neg balance was successful 2.6 liters, maintain  GASTROINTESTINAL A:  Nutrition. Dysphagia. P:  Continue tube feeds after ett ppi  HEMATOLOGIC / ONCOLOGIC A: Acute blood loss anemia 2nd to Rt perinephric hematoma. Anemia of critical illness and chronic disease. Thrombocytopenia. P:  F/u CBC am SCDs F/u Seritonin release assay from 5/10  INFECTIOUS A:  Septic shock with Klebsiella bacteremia >> completed Abx 5/10 NEW HCAP aspiration P:  Monitor fever curve Add ecftaz, vanc, follow bronch assessment  ENDOCRINE A:  DM. P:  Continue SSI Get cortisol if less 20 add stress roids  NEUROLOGIC A:  Acute encephalopathy 2nd to sepsis, shock, respiratory failure, renal failure >> improved. Deconditioning. P:  PT Fentanyl for rass goals -2  Updated pt's wife at bedside pre intubation Ccm time 50 min   Lavon Paganini. Titus Mould, MD, [place] Pgr: [facility]

## 2015-06-21 NOTE — Progress Notes (Signed)
Pharmacy Antibiotic Note  YSIDORO AZIZI is a 75 y.o. male with acute respiratory failure s/p intubation. Pharmacy has been consulted for fortaz and vancomycin dosing for HCAP. He is also noted on CRRT -WBC= 6.1, afebrile, SCr= 1.34  Plan: -Vancomycin 2000mg  IV x1 followed by 1000mg  IV q24hr -Fortaz 2gm IV q12h -Will follow renal function, cultures and clinical progress    Height: 5\' 9"  (175.3 cm) Weight: 269 lb 13.5 oz (122.4 kg) IBW/kg (Calculated) : 70.7  Temp (24hrs), Avg:97.7 F (36.5 C), Min:97.3 F (36.3 C), Max:98.4 F (36.9 C)   Recent Labs Lab 06/17/15 0507  06/18/15 0356  06/19/15 0415 06/19/15 1646 06/20/15 0410 06/20/15 0420 06/20/15 1545 06/21/15 0425 06/21/15 1600  WBC 5.4  --  5.3  --  6.3  --  5.5  --   --  6.1  --   CREATININE  --   < >  --   < > 1.69* 1.73*  --  1.54* 1.52* 1.44* 1.34*  < > = values in this interval not displayed.  Estimated Creatinine Clearance: 63.5 mL/min (by C-G formula based on Cr of 1.34).    No Known Allergies  Antimicrobials this admission: 4/25 Ceftaz >> 4/27 4/28 CTX >> 5/10 5/16 vanc 5/16 fortaz>>  Microbiology results: 4/25 BCx2: Kleb pneumo (R-amp) 4/27 BCx2: negative 4/25 MRSA pcr: negative 4/25 UC: mult species 5/16 resp  Thank you for allowing pharmacy to be a part of this patient's care.  Hildred Laser, Pharm D 06/21/2015 9:11 PM

## 2015-06-21 NOTE — Progress Notes (Signed)
Reginal Lutes MD about pts waxing and waning mental status. Also pts work of breathing is increased and pt unable to cough/clear secretions. New orders given. RT made aware of ABG.   Called and updated pts wife about pts condition. Pts wife agreed and gave consent that it was ok to intubate pt.    Will continue to monitor closely.

## 2015-06-21 NOTE — Procedures (Deleted)
Intubation Procedure Note KUMAR MESLER [id number] 1941-01-16  Procedure: Intubation Indications: Airway protection and maintenance  Procedure Details Consent: Risks of procedure as well as the alternatives and risks of each were explained to the (patient/caregiver).  Consent for procedure obtained. Time Out: Verified patient identification, verified procedure, site/side was marked, verified correct patient position, special equipment/implants available, medications/allergies/relevent history reviewed, required imaging and test results available.  Performed  Maximum sterile technique was used including cap, gloves, hand hygiene and mask.  MAC and 3 Glide  Medications 115mcg fentanyl 2mg  versed 20mg  etomidate  Grade 1 airway view with glidescope   Evaluation Hemodynamic Status: BP stable throughout; O2 sats: stable throughout Patient's Current Condition: stable Complications: No apparent complications Patient did tolerate procedure well. Chest X-ray ordered to verify placement.  CXR: pending.   Corey Harold 06/21/2015

## 2015-06-21 NOTE — Procedures (Signed)
Intubation Procedure Note [name] [id number] Jan 09, 1941  Procedure: Intubation Indications: Respiratory insufficiency  Procedure Details Consent: Risks of procedure as well as the alternatives and risks of each were explained to the (patient/caregiver).  Consent for procedure obtained. Time Out: Verified patient identification, verified procedure, site/side was marked, verified correct patient position, special equipment/implants available, medications/allergies/relevent history reviewed, required imaging and test results available.  Performed  Maximum sterile technique was used including cap, gloves, gown, hand hygiene and mask.  MAC and 4    Evaluation Hemodynamic Status: BP stable throughout; O2 sats: stable throughout Patient's Current Condition: stable Complications: No apparent complications Patient did tolerate procedure well. Chest X-ray ordered to verify placement.  CXR: pending.   [name] 06/21/2015  Post with low BS left Pus all coming from airway I DID THIS PROCEDURE MYSELF!!!!!!!!!!!!  [name]. Titus Mould, MD, [place] Pgr: [facility]

## 2015-06-22 ENCOUNTER — Encounter (HOSPITAL_COMMUNITY): Payer: BLUE CROSS/BLUE SHIELD

## 2015-06-22 ENCOUNTER — Inpatient Hospital Stay (HOSPITAL_COMMUNITY): Payer: BLUE CROSS/BLUE SHIELD

## 2015-06-22 LAB — RENAL FUNCTION PANEL
ALBUMIN: 2.5 g/dL — AB (ref 3.5–5.0)
ANION GAP: 9 (ref 5–15)
Albumin: 2.5 g/dL — ABNORMAL LOW (ref 3.5–5.0)
Anion gap: 9 (ref 5–15)
BUN: 29 mg/dL — ABNORMAL HIGH (ref 6–20)
BUN: 28 mg/dL — AB (ref 6–20)
CALCIUM: 8 mg/dL — AB (ref 8.9–10.3)
CHLORIDE: 102 mmol/L (ref 101–111)
CHLORIDE: 100 mmol/L — AB (ref 101–111)
CO2: 26 mmol/L (ref 22–32)
CO2: 27 mmol/L (ref 22–32)
CREATININE: 1.18 mg/dL (ref 0.61–1.24)
Calcium: 8.2 mg/dL — ABNORMAL LOW (ref 8.9–10.3)
Creatinine, Ser: 1.35 mg/dL — ABNORMAL HIGH (ref 0.61–1.24)
GFR calc non Af Amer: 50 mL/min — ABNORMAL LOW (ref >60)
GFR, EST AFRICAN AMERICAN: 59 mL/min — AB (ref >60)
GFR, EST NON AFRICAN AMERICAN: 59 mL/min — AB (ref >60)
Glucose, Bld: 253 mg/dL — ABNORMAL HIGH (ref 65–99)
Glucose, Bld: 267 mg/dL — ABNORMAL HIGH (ref 65–99)
PHOSPHORUS: 2.5 mg/dL (ref 2.5–4.6)
POTASSIUM: 4.4 mmol/L (ref 3.5–5.1)
Phosphorus: 2.3 mg/dL — ABNORMAL LOW (ref 2.5–4.6)
Potassium: 4.2 mmol/L (ref 3.5–5.1)
SODIUM: 137 mmol/L (ref 135–145)
Sodium: 136 mmol/L (ref 135–145)

## 2015-06-22 LAB — CBC
HEMATOCRIT: 26 % — AB (ref 39.0–52.0)
HEMOGLOBIN: 8.1 g/dL — AB (ref 13.0–17.0)
MCH: 28.6 pg (ref 26.0–34.0)
MCHC: 31.2 g/dL (ref 30.0–36.0)
MCV: 91.9 fL (ref 78.0–100.0)
Platelets: 175 10*3/uL (ref 150–400)
RBC: 2.83 MIL/uL — AB (ref 4.22–5.81)
RDW: 18.5 % — AB (ref 11.5–15.5)
WBC: 11.2 10*3/uL — AB (ref 4.0–10.5)

## 2015-06-22 LAB — SEROTONIN RELEASE ASSAY (SRA)
SRA 100IU/mL UFH Ser-aCnc: 3 % (ref 0–20)
SRA, LOW DOSE HEPARIN: 7 % (ref 0–20)

## 2015-06-22 LAB — GLUCOSE, CAPILLARY
GLUCOSE-CAPILLARY: 238 mg/dL — AB (ref 65–99)
GLUCOSE-CAPILLARY: 203 mg/dL — AB (ref 65–99)
GLUCOSE-CAPILLARY: 223 mg/dL — AB (ref 65–99)
GLUCOSE-CAPILLARY: 208 mg/dL — AB (ref 65–99)
GLUCOSE-CAPILLARY: 206 mg/dL — AB (ref 65–99)

## 2015-06-22 LAB — MAGNESIUM: Magnesium: 2.5 mg/dL — ABNORMAL HIGH (ref 1.7–2.4)

## 2015-06-22 MED ORDER — ALBUTEROL SULFATE (2.5 MG/3ML) 0.083% IN NEBU
2.5000 mg | INHALATION_SOLUTION | RESPIRATORY_TRACT | Status: DC | PRN
Start: 2015-06-22 — End: 2015-07-31
  Administered 2015-07-28 – 2015-07-30 (×5): 2.5 mg via RESPIRATORY_TRACT
  Filled 2015-06-22 (×5): qty 3

## 2015-06-22 MED ORDER — NOREPINEPHRINE BITARTRATE 1 MG/ML IV SOLN
2.0000 ug/min | INTRAVENOUS | Status: DC
  Administered 2015-06-23: 17 ug/min via INTRAVENOUS
  Administered 2015-06-23: 12 ug/min via INTRAVENOUS
  Administered 2015-06-24: 18 ug/min via INTRAVENOUS
  Administered 2015-06-25: 30 ug/min via INTRAVENOUS
  Administered 2015-06-25: 35 ug/min via INTRAVENOUS
  Administered 2015-06-25: 32.5 ug/min via INTRAVENOUS
  Administered 2015-06-26 (×2): 31 ug/min via INTRAVENOUS
  Administered 2015-06-27: 20 ug/min via INTRAVENOUS
  Administered 2015-06-28: 15 ug/min via INTRAVENOUS
  Administered 2015-06-29 (×2): 10 ug/min via INTRAVENOUS
  Administered 2015-07-01 (×2): 22 ug/min via INTRAVENOUS
  Administered 2015-07-01: 16 ug/min via INTRAVENOUS
  Administered 2015-07-02: 24 ug/min via INTRAVENOUS
  Administered 2015-07-03: 10 ug/min via INTRAVENOUS
  Administered 2015-07-03: 12 ug/min via INTRAVENOUS
  Administered 2015-07-03: 14 ug/min via INTRAVENOUS
  Administered 2015-07-03: 18 ug/min via INTRAVENOUS
  Administered 2015-07-03: 8 ug/min via INTRAVENOUS
  Administered 2015-07-03: 17.067 ug/min via INTRAVENOUS
  Administered 2015-07-03: 10 ug/min via INTRAVENOUS
  Administered 2015-07-05: 11 ug/min via INTRAVENOUS
  Filled 2015-06-22 (×20): qty 16

## 2015-06-22 MED ORDER — PRO-STAT SUGAR FREE PO LIQD
30.0000 mL | Freq: Three times a day (TID) | ORAL | Status: DC
  Administered 2015-06-22 – 2015-06-29 (×21): 30 mL
  Filled 2015-06-22 (×22): qty 30

## 2015-06-22 MED ORDER — MIDODRINE HCL 5 MG PO TABS
5.0000 mg | ORAL_TABLET | Freq: Three times a day (TID) | ORAL | Status: DC
  Administered 2015-06-22 – 2015-06-24 (×6): 5 mg via ORAL
  Filled 2015-06-22 (×8): qty 1

## 2015-06-22 MED ORDER — VITAL HIGH PROTEIN PO LIQD
1000.0000 mL | ORAL | Status: DC
  Administered 2015-06-22 – 2015-06-29 (×10): 1000 mL
  Filled 2015-06-22: qty 1000

## 2015-06-22 NOTE — Progress Notes (Signed)
PULMONARY / CRITICAL CARE MEDICINE   Name: [name] MRN: [id number] DOB: 1941/01/07    ADMISSION DATE:  05/31/2015  REFERRING MD:  EDP  CHIEF COMPLAINT:  Hypotension   SUBJECTIVE:  Re-intubated overnight.  VITAL SIGNS: BP 84/58 mmHg  Pulse 69  Temp(Src) 96.7 F (35.9 C) (Oral)  Resp 22  Ht 5\' 9"  (1.753 m)  Wt 259 lb 0.7 oz (117.5 kg)  BMI 38.24 kg/m2  SpO2 100%  INTAKE / OUTPUT: I/O last 3 completed shifts: In: 5248.4 [I.V.:853.4; NG/GT:3045; IV [id number] Out: 8901 [Other:8901]  PHYSICAL EXAMINATION: General: sleepy Neuro: RASS -2 Cardiac: regular Chest: faint crackles b/l Abd: soft, non tender Ext: 1+ edema Skin: no rashes  LABS:  BMET  Recent Labs Lab 06/21/15 0425 06/21/15 1600 06/22/15 0329  NA 138 135 137  K 4.3 4.4 4.2  CL 101 100* 102  CO2 26 27 26   BUN 30* 31* 29*  CREATININE 1.44* 1.34* 1.35*  GLUCOSE 193* 208* 253*    Electrolytes  Recent Labs Lab 06/20/15 0410  06/21/15 0425 06/21/15 1600 06/22/15 0328 06/22/15 0329  CALCIUM  --   < > 8.3* 8.1*  --  8.0*  MG 2.4  --  2.4  --  2.5*  --   PHOS  --   < > 2.2* 2.4*  --  2.3*  < > = values in this interval not displayed.  CBC  Recent Labs Lab 06/20/15 0410 06/21/15 0425 06/22/15 0328  WBC 5.5 6.1 11.2*  HGB 7.8* 8.4* 8.1*  HCT 25.2* 25.7* 26.0*  PLT 134* 177 175    Coag's  Recent Labs Lab 06/17/15 0507  INR 1.57*    Sepsis Markers No results for input(s): LATICACIDVEN, PROCALCITON, O2SATVEN in the last 168 hours.  ABG  Recent Labs Lab 06/16/15 0945 06/21/15 1947 06/21/15 2158  PHART 7.372 7.474* 7.458*  PCO2ART 43.8 34.7* 41.5  PO2ART 142.0* 81.0 529.0*    Liver Enzymes  Recent Labs Lab 06/21/15 0425 06/21/15 1600 06/22/15 0329  ALBUMIN 2.5* 2.6* 2.5*    Cardiac Enzymes No results for input(s): TROPONINI, PROBNP in the last 168 hours.  Glucose  Recent Labs Lab 06/21/15 1207 06/21/15 1600 06/21/15 1939 06/21/15 2309  06/22/15 0335 06/22/15 0744  GLUCAP 169* 204* 124* 224* 238* 203*    Imaging Portable Chest Xray  06/21/2015  CLINICAL DATA:  75 year old male status post intubation. EXAM: PORTABLE CHEST 1 VIEW COMPARISON:  Earlier radiograph dated 06/21/2015 FINDINGS: There has been interval placement of an endotracheal tube with tip approximately 2 cm above the carina. Recommend retraction of the tube by 2 cm for optimal positioning. Right-sided dialysis catheter and enteric tube remain in stable positioning. There is minimal left lung base atelectatic changes. There is no focal consolidation, pleural effusion, or pneumothorax. There is stable cardiomegaly. No acute osseous pathology. IMPRESSION: Interval placement of an endotracheal tube with tip approximately 2 cm above the carina. Electronically Signed   By: Anner Crete M.D.   On: 06/21/2015 21:29     STUDIES:  4/26 Renal u/s >> s/p Lt nephrectomy 4/27 CT abd/pelvis >> Rt perinephric hematoma 5/02 CT abd/pelvis >> increased size of hematoma 5/08 Echo >> EF 65 to 70%  CULTURES: 4/25 Blood >> Klebsiella pneumoniae 4/27 Blood >> negative 5/16 BAL >>  ANTIBIOTICS: 4/28 Rocephin >> 5/10  SIGNIFICANT EVENTS: 4/25 Admitted with septic shock due to UTI. Significant ARF. 4/30 transferred out of ICU. 5/03 Cardiac arrest, anemia >> coil embolization Rt kidney by IR; PTX post-CPR  5/16 Reintubated due to airway secretions  LINES/TUBES: 4/26 Rt IJ HD cath >. 5/12 5/03 ETT >> 5/13 5/03 Lt femoral CVL >>  5/03 Lt chest tube >> 5/14 5/03 Rt chest tube >> 5/14 5/12 Rt IJ tunneled HD cath >>  5/16 ETT >>   DISCUSSION: 75 yo male with hemorrhagic shock leading to cardiac arrest, VDRF, AKI from Rt perinephric hematoma.  He has hx of Lt renal cell carcinoma s/p Lt nephrectomy, prostate cancer s/p XRT, PE in 2008, OSA, HTN, HLD, DM, A fib.  ASSESSMENT / PLAN:  PULMONARY A: Acute hypoxic respiratory failure in setting of cardiac  arrest. Pneumothorax after CPR 5/03 >> chest tubes removed 5/14. Hx of OSA. Re-intubated 5/16 due to respiratory secretions. P:  Pressure support wean as tolerated F/u CXR Might need trach  CARDIOVASCULAR A: Hemorrhagic shock 2nd to Rt perinephric hematoma. Hx of A fib, HTN, HLD. P:  Pressors to keep MAP > 65 Hold outpt coreg, tricor, lasix, niacin, accupril No coumadin for now in setting of hematoma Add midodrine 5/17  RENAL A:  AKI with hx of Stage III CKD. S/p Lt nephrectomy for RCC. P:  CRRT per nephrology  GASTROINTESTINAL A:  Nutrition. Dysphagia. P:  Continue tube feeds Protonix for SUP  HEMATOLOGIC / ONCOLOGIC A: Acute blood loss anemia 2nd to Rt perinephric hematoma. Anemia of critical illness and chronic disease. Thrombocytopenia. P:  F/u CBC SCDs F/u Seritonin release assay from 5/10  INFECTIOUS A:  Septic shock with Klebsiella bacteremia >> completed Abx 5/10. P:  Monitor fever curve  ENDOCRINE A:  DM. P:  Continue SSI  NEUROLOGIC A:  Acute encephalopathy 2nd to sepsis, shock, respiratory failure, renal failure >> improved. Deconditioning. P:  RASS goal -1  Updated pt's wife at bedside.  CC time 31 minutes.  Chesley Mires, MD [facility] 06/22/2015, 9:02 AM Pager:  [phone] After 3pm call: [phone]

## 2015-06-22 NOTE — Care Management Note (Signed)
Case Management Note  Patient Details  Name: [name] MRN: [id number] Date of Birth: 1940-10-22  Subjective/Objective:    Pt originally admitted with septic shock - then found to have perinephric hematoma                 Action/Plan:   06/22/2015  Pt reintubated overnight with continuous sedation and pressor.  Continuous CRRT  06/20/15 Pt remains on CRRT.  Pressor reinitiated over weekend.  CM will continue to follow for discharge needs  06/17/15 Pt remains ventilated on sedation with tube feeds.  Pt remains on CRRT - HD catheter placed in IR 06/17/15.  CM consulted with attending  regarding appropriateness for [facility] referral; pt does not appear to be appropriate for LTACH at this time - will revisit appropriateness next week   06/09/15: Pt is from home with wife - now intubated s/p IR procedure.  CM will continue to monitor for disposition needs   Expected Discharge Date:                  Expected Discharge Plan:  [name]  In-House Referral:  Clinical Social Work  Discharge planning Services  CM Consult  Post Acute Care Choice:    Choice offered to:     DME Arranged:    DME Agency:     HH Arranged:    [place] Agency:     Status of Service:  In process, will continue to follow  Medicare Important Message Given:    Date Medicare IM Given:    Medicare IM give by:    Date Additional Medicare IM Given:    Additional Medicare Important Message give by:     If discussed at [place] of Stay Meetings, dates discussed:    Additional Comments:  Maryclare Labrador, RN 06/22/2015, 10:13 AM

## 2015-06-22 NOTE — Procedures (Signed)
Central Venous Catheter Insertion Procedure Note NYAIR SAMPAIO [id number] Aug 24, 1940  Procedure: Insertion of Central Venous Catheter Indications: Assessment of intravascular volume, Drug and/or fluid administration and Frequent blood sampling  Procedure Details Consent: Risks of procedure as well as the alternatives and risks of each were explained to the (patient/caregiver).  Consent for procedure obtained. Time Out: Verified patient identification, verified procedure, site/side was marked, verified correct patient position, special equipment/implants available, medications/allergies/relevent history reviewed, required imaging and test results available.  Performed  Maximum sterile technique was used including antiseptics, cap, gloves, gown, hand hygiene, mask and sheet. Skin prep: Chlorhexidine; local anesthetic administered A antimicrobial bonded/coated triple lumen catheter was placed in the left subclavian vein using the Seldinger technique.  Evaluation Blood flow good Complications: No apparent complications Patient did tolerate procedure well. Chest X-ray ordered to verify placement.  CXR: pending.  Procedure performed under direct ultrasound guidance for real time vessel cannulation.      Montey Hora, [place] [facility] Pager: [phone]  or [phone] 06/22/2015, 2:01 PM

## 2015-06-22 NOTE — Progress Notes (Signed)
Nutrition Follow-up  DOCUMENTATION CODES:   Obesity unspecified  INTERVENTION:   Change tube feeding to Vital High Protein via NGT @ goal rate of 65 ml/h (1560 ml per day) with Prostat 30 ml TID to provide 1860 kcals, 181.5 gm protein, 1304 per day.   NUTRITION DIAGNOSIS:   Inadequate oral intake related to inability to eat as evidenced by NPO status.  Ongoing   GOAL:   Patient will meet greater than or equal to 90% of their needs  Meeting   MONITOR:   Labs, Weight trends, I & O's, TF tolerance, Skin   ASSESSMENT:   75 y.o. male with PMH as outlined below including prostate CA s/p radiation 2011 and RCC s/p left nephrectomy. He was seen in ED 4/22 for clogged catheter which was irrigated and replaced. He apparently has had hematuria for quite some time which has been attributed to scarring from prior radiation.  5/16 pt re-intubated  Temp (24hrs), Avg:97 F (36.1 C), Min:94.5 F (34.7 C), Max:98.4 F (36.9 C) Pt remains on CRRT. Re-estimated needs.  Change tube feeding to Vital High Protein.   NFPE: No fat depletion, no muscle depletion, no edema. Labs reviewed; BUN 29, creat 1.35, Ca 8, GFR 59, Phos 2.3, mag 2.5. Meds reviewed.   Diet Order:  Diet NPO time specified  Skin:  Reviewed, no issues  Last BM:  5/16  Height:   Ht Readings from Last 1 Encounters:  06/08/15 5\' 9"  (1.753 m)    Weight:   Wt Readings from Last 1 Encounters:  06/22/15 259 lb 0.7 oz (117.5 kg)    Ideal Body Weight:  72.7 kg  BMI:  Body mass index is 38.24 kg/(m^2).  Estimated Nutritional Needs:   Kcal:  [id number]  Protein:  181  Fluid:  Per MD  EDUCATION NEEDS:   No education needs identified at this time  Geoffery Lyons, [place] Dietetic Intern Pager [phone]

## 2015-06-22 NOTE — Procedures (Signed)
Admit: 05/31/2015 LOS: 34  26M solitary kidney with AKI on CRRT related to perinephric hemorrhage with shock.  Current CRRT Prescription: Start Date: on/off, last start 5/3? Catheter: R IJ TUnneled catheter BFR: 150-200/h Pre Blood Pump: 300 4K DFR: 2000 4K Replacement Rate: 400 4K Goal UF: 150-240m net neg/hr Anticoagulation: no heparin Clotting: ~q24h   S: No UOP overnight -- foley out Remains On low dose NE Reintubated overnight, likely HCAP, on ABX Vanc/Ceftaz Cont to tolerate net negative with UF Weights coming down as expected   O: 05/16 0701 - 05/17 0700 In: 34462[I.V.:653; NG/GT:2000; IV Piggyback:1098] Out: 6228   [facility] LLC[facility]ights   06/20/15 0530 06/21/15 0500 06/22/15 0318  Weight: 126.6 kg (279 lb 1.6 oz) 122.4 kg (269 lb 13.5 oz) 117.5 kg (259 lb 0.7 oz)     Recent Labs Lab 06/21/15 0425 06/21/15 1600 06/22/15 0329  NA 138 135 137  K 4.3 4.4 4.2  CL 101 100* 102  CO2 '26 27 26  ' GLUCOSE 193* 208* 253*  BUN 30* 31* 29*  CREATININE 1.44* 1.34* 1.35*  CALCIUM 8.3* 8.1* 8.0*  PHOS 2.2* 2.4* 2.3*    Recent Labs Lab 06/20/15 0410 06/21/15 0425 06/22/15 0328  WBC 5.5 6.1 11.2*  HGB 7.8* 8.4* 8.1*  HCT 25.2* 25.7* 26.0*  MCV 91.3 91.8 91.9  PLT 134* 177 175    Scheduled Meds: . acetylcysteine  4 mL Nebulization BID  . albuterol  2.5 mg Nebulization BID  . antiseptic oral rinse  7 mL Mouth Rinse QID  . atropine      . cefTAZidime (FORTAZ)  IV  2 g Intravenous Q12H  . chlorhexidine gluconate (SAGE KIT)  15 mL Mouth Rinse BID  . sennosides  5 mL Oral BID   And  . docusate  100 mg Oral BID  . feeding supplement (PRO-STAT SUGAR FREE 64)  30 mL Per Tube BID  . fentaNYL (SUBLIMAZE) injection  50 mcg Intravenous Once  . insulin aspart  0-15 Units Subcutaneous Q4H  . pantoprazole sodium  40 mg Per Tube Q1200  . sodium chloride flush  10-40 mL Intracatheter Q12H  . sodium phosphate  Dextrose 5% IVPB  10 mmol Intravenous BID  . vancomycin  1,000 mg  Intravenous Q24H   Continuous Infusions: . sodium chloride 10 mL/hr at 06/21/15 1710  . feeding supplement (VITAL AF 1.2 CAL) 1,000 mL (06/22/15 0646)  . fentaNYL infusion INTRAVENOUS 200 mcg/hr (06/21/15 2254)  . norepinephrine (LEVOPHED) Adult infusion 6 mcg/min (06/22/15 0430)  . dialysis replacement fluid (prismasate) 1,000 mL/hr at 06/22/15 0202  . dialysis replacement fluid (prismasate) 400 mL/hr at 06/21/15 2331  . dialysate (PRISMASATE) 2,000 mL/hr at 06/22/15 0646   PRN Meds:.acetaminophen (TYLENOL) oral liquid 160 mg/5 mL, fentaNYL, guaiFENesin, lidocaine (PF), lidocaine-prilocaine, midazolam, midazolam, ondansetron, pentafluoroprop-tetrafluoroeth, sodium chloride flush  ABG    Component Value Date/Time   PHART 7.458* 06/21/2015 2158   PCO2ART 41.5 06/21/2015 2158   PO2ART 529.0* 06/21/2015 2158   HCO3 29.4* 06/21/2015 2158   TCO2 31 06/21/2015 2158   ACIDBASEDEF 3.9* 06/12/2015 0535   O2SAT 100.0 06/21/2015 2158    A 1. Anuric Dialysis dependent AKI with ATN, low likelihood of recovery 1. BL SCr 1.7-1.9  2. 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 Hemorrhagic Shock 2/2 ABLA 2/2 R perinephric hematoma s/p coil embolization 3. UTI with K Pneumoniae s/p ABX, completed 5/10 4. HCAP 5/16 back on ABX with VDRF 5. Hx/o L nephrectomy for RCC 6. AFib  7. Hx/o DVT/PE 8. Hypervolema / Anasarca -- tolerating UF 9. Hypophosphatemia 2/2 CRRT 10. HTN 11. Anemia 12.  DM2   P 1. Cont CRRT, pull fluid as able 2. Wouldn't push pressors to inc UF  Pearson Grippe, MD [employer] pgr [phone]

## 2015-06-22 NOTE — Progress Notes (Signed)
Results for BROADUS, POUND (MRN [id number]) as of 06/22/2015 12:45  Ref. Range 06/21/2015 23:09 06/22/2015 03:35 06/22/2015 07:44 06/22/2015 11:22  Glucose-Capillary Latest Ref Range: 65-99 mg/dL 224 (H) 238 (H) 203 (H) 223 (H)  Noted that blood sugars continue to be greater than 180 mg/dl. Recommend adding Novolog 3-4 units every 4 hours for tube feed coverage. Continue Novolog MODERATE correction scale every 4 hours. Will monitor blood sugars while in the hospital. Harvel Ricks RN BSN CDE

## 2015-06-22 NOTE — Progress Notes (Signed)
SLP Cancellation Note  Patient Details Name: [name] MRN: [id number] DOB: Nov 13, 1940   Cancelled treatment:       Reason Eval/Treat Not Completed: Medical issues which prohibited therapy. Pt now intubated. SLP to s/o, please re-order when ready.   Germain Osgood, M.A. CCC-SLP [phone]  Germain Osgood 06/22/2015, 8:55 AM

## 2015-06-23 ENCOUNTER — Inpatient Hospital Stay (HOSPITAL_COMMUNITY): Payer: BLUE CROSS/BLUE SHIELD

## 2015-06-23 DIAGNOSIS — M7989 Other specified soft tissue disorders: Secondary | ICD-10-CM

## 2015-06-23 LAB — RENAL FUNCTION PANEL
ALBUMIN: 2.4 g/dL — AB (ref 3.5–5.0)
ANION GAP: 10 (ref 5–15)
ANION GAP: 9 (ref 5–15)
Albumin: 2.4 g/dL — ABNORMAL LOW (ref 3.5–5.0)
BUN: 32 mg/dL — ABNORMAL HIGH (ref 6–20)
BUN: 33 mg/dL — ABNORMAL HIGH (ref 6–20)
CALCIUM: 8.5 mg/dL — AB (ref 8.9–10.3)
CHLORIDE: 99 mmol/L — AB (ref 101–111)
CO2: 26 mmol/L (ref 22–32)
CO2: 26 mmol/L (ref 22–32)
Calcium: 8 mg/dL — ABNORMAL LOW (ref 8.9–10.3)
Chloride: 100 mmol/L — ABNORMAL LOW (ref 101–111)
Creatinine, Ser: 1.26 mg/dL — ABNORMAL HIGH (ref 0.61–1.24)
Creatinine, Ser: 1.27 mg/dL — ABNORMAL HIGH (ref 0.61–1.24)
GFR calc Af Amer: >60 mL/min (ref >60)
GFR, EST NON AFRICAN AMERICAN: 55 mL/min — AB (ref >60)
GFR, EST NON AFRICAN AMERICAN: 54 mL/min — AB (ref >60)
Glucose, Bld: 215 mg/dL — ABNORMAL HIGH (ref 65–99)
Glucose, Bld: 247 mg/dL — ABNORMAL HIGH (ref 65–99)
PHOSPHORUS: 2.8 mg/dL (ref 2.5–4.6)
PHOSPHORUS: 3 mg/dL (ref 2.5–4.6)
POTASSIUM: 5 mmol/L (ref 3.5–5.1)
POTASSIUM: 5.3 mmol/L — AB (ref 3.5–5.1)
SODIUM: 135 mmol/L (ref 135–145)
Sodium: 135 mmol/L (ref 135–145)

## 2015-06-23 LAB — GLUCOSE, CAPILLARY
GLUCOSE-CAPILLARY: 173 mg/dL — AB (ref 65–99)
GLUCOSE-CAPILLARY: 193 mg/dL — AB (ref 65–99)
GLUCOSE-CAPILLARY: 211 mg/dL — AB (ref 65–99)
Glucose-Capillary: 195 mg/dL — ABNORMAL HIGH (ref 65–99)
Glucose-Capillary: 200 mg/dL — ABNORMAL HIGH (ref 65–99)
Glucose-Capillary: 221 mg/dL — ABNORMAL HIGH (ref 65–99)
Glucose-Capillary: 224 mg/dL — ABNORMAL HIGH (ref 65–99)

## 2015-06-23 LAB — CBC
HEMATOCRIT: 26.3 % — AB (ref 39.0–52.0)
HEMOGLOBIN: 8.1 g/dL — AB (ref 13.0–17.0)
MCH: 28.5 pg (ref 26.0–34.0)
MCHC: 30.8 g/dL (ref 30.0–36.0)
MCV: 92.6 fL (ref 78.0–100.0)
Platelets: 181 10*3/uL (ref 150–400)
RBC: 2.84 MIL/uL — AB (ref 4.22–5.81)
RDW: 18.6 % — ABNORMAL HIGH (ref 11.5–15.5)
WBC: 13.4 10*3/uL — AB (ref 4.0–10.5)

## 2015-06-23 LAB — MAGNESIUM: MAGNESIUM: 2.2 mg/dL (ref 1.7–2.4)

## 2015-06-23 LAB — HEPARIN LEVEL (UNFRACTIONATED): HEPARIN UNFRACTIONATED: 0.35 [IU]/mL (ref 0.30–0.70)

## 2015-06-23 MED ORDER — HEPARIN BOLUS VIA INFUSION
4000.0000 [IU] | Freq: Once | INTRAVENOUS | Status: AC
  Administered 2015-06-23: 4000 [IU] via INTRAVENOUS
  Filled 2015-06-23: qty 4000

## 2015-06-23 MED ORDER — HEPARIN (PORCINE) IN NACL 100-0.45 UNIT/ML-% IJ SOLN
1250.0000 [IU]/h | INTRAMUSCULAR | Status: DC
  Administered 2015-06-23 – 2015-06-24 (×3): 1500 [IU]/h via INTRAVENOUS
  Administered 2015-06-25: 1250 [IU]/h via INTRAVENOUS
  Filled 2015-06-23 (×7): qty 250

## 2015-06-23 NOTE — Progress Notes (Signed)
VASCULAR LAB PRELIMINARY  PRELIMINARY  PRELIMINARY  PRELIMINARY  Bilateral lower extremity venous duplex  completed.    Preliminary report:  Right:  Acute DVT noted in the gastrocnemius vein.  DVT of indeterminate age noted in the FV and popliteal vein.  Cannot rule out DVT in the calf.  Left:  No evidence of acute DVT.  Cannot rule out chronic DVT in the calf. Technically limited by body habitus and edema.  Hyland Mollenkopf, RVT 06/23/2015, 9:23 AM

## 2015-06-23 NOTE — Progress Notes (Signed)
ANTICOAGULATION CONSULT NOTE   Pharmacy Consult for heparin Indication: DVT  No Known Allergies  Patient Measurements: Height: 5\' 9"  (175.3 cm) Weight: 261 lb 7.5 oz (118.6 kg) IBW/kg (Calculated) : 70.7 Heparin Dosing Weight: 97.5 kg  Vital Signs: Temp: 97.6 F (36.4 C) (05/18 1504) Temp Source: Oral (05/18 1504) BP: 82/57 mmHg (05/18 1900) Pulse Rate: 71 (05/18 1600)  Labs:  Recent Labs  06/21/15 0425  06/22/15 0328  06/22/15 1646 06/23/15 0325 06/23/15 1600 06/23/15 1800  HGB 8.4*  --  8.1*  --   --  8.1*  --   --   HCT 25.7*  --  26.0*  --   --  26.3*  --   --   PLT 177  --  175  --   --  181  --   --   HEPARINUNFRC  --   --   --   --   --   --   --  0.35  CREATININE 1.44*  < >  --   < > 1.18 1.26* 1.27*  --   < > = values in this interval not displayed.  Estimated Creatinine Clearance: 65.9 mL/min (by C-G formula based on Cr of 1.27).  Assessment: 75 yo male with acute DVT in gastrocnemius vein. Pt is on warfarin PTA for hx of DVT / PE.  PTA warfarin dose: 6 mg q Mon/Wed/Fri, 5 mg all other days. Pt has had prior complications with anticoagulation. He reportedly has chronic hematuria.  He also has a perinephric hematoma, this increased in size and caused the pt to go into hemorrhagic shock in the beginning of the month.  IR successfully emoblized the kidney area on 5/3. Since then, all anticoagulation has been held. Pt has become more stable over the past two weeks and now with finding of acute DVT, we will resume a heparin infusion.  Pt became thrombocytopenic around 5/8-5/9. HIT panel was sent off and was negative. hgb 8.1, plts wnl. Pt is s/p multiple units of FFP and pRBC during this admission. Pt has a history of L nephrectomy due to [place] and has been on CRRT since 5/3.  Initial heparin level is therapeutic at 0.35. No bleeding noted.   Goal of Therapy:  Heparin level 0.3-0.5 units/mL  Monitor platelets by anticoagulation protocol: Yes    Plan:  - Continue  heparin gtt 1500 units/hr - Confirm dosing with AM heparin level - Daily heparin level and CBC  Annelle Behrendt, Rande Lawman 06/23/2015,7:09 PM

## 2015-06-23 NOTE — Procedures (Signed)
Admit: 05/31/2015 LOS: 47  56M solitary kidney with AKI on CRRT related to perinephric hemorrhage with shock.  Current CRRT Prescription: Start Date: on/off, last start 5/3? Catheter: R IJ TUnneled catheter BFR: 150-200/h Pre Blood Pump: 300 4K DFR: 2000 4K Replacement Rate: 400 4K Goal UF: 150-273m net neg/hr Anticoagulation: no heparin Clotting: ~q24h   S: Remains on NE Intubated, awake Cont to tolerate UF  O: 05/17 0701 - 05/18 0700 In: 3434.1 [I.V.:1304.1; NG/GT:1710; IV Piggyback:420] Out: 5115   Filed Weights   06/21/15 0500 06/22/15 0318 06/23/15 0500  Weight: 122.4 kg (269 lb 13.5 oz) 117.5 kg (259 lb 0.7 oz) 118.6 kg (261 lb 7.5 oz)     Recent Labs Lab 06/22/15 0329 06/22/15 1646 06/23/15 0325  NA 137 136 135  K 4.2 4.4 5.0  CL 102 100* 99*  CO2 _0 GLUCOSE 253* 267* 215*  BUN 29* 28* 32*  CREATININE 1.35* 1.18 1.26*  CALCIUM 8.0* 8.2* 8.0*  PHOS 2.3* 2.5 2.8    Recent Labs Lab 06/21/15 0425 06/22/15 0328 06/23/15 0325  WBC 6.1 11.2* 13.4*  HGB 8.4* 8.1* 8.1*  HCT 25.7* 26.0* 26.3*  MCV 91.8 91.9 92.6  PLT 177 175 181    Scheduled Meds: . antiseptic oral rinse  7 mL Mouth Rinse QID  . chlorhexidine gluconate (SAGE KIT)  15 mL Mouth Rinse BID  . sennosides  5 mL Oral BID   And  . docusate  100 mg Oral BID  . feeding supplement (PRO-STAT SUGAR FREE 64)  30 mL Per Tube TID  . insulin aspart  0-15 Units Subcutaneous Q4H  . midodrine  5 mg Oral TID AC  . pantoprazole sodium  40 mg Per Tube Q1200  . sodium chloride flush  10-40 mL Intracatheter Q12H  . sodium phosphate  Dextrose 5% IVPB  10 mmol Intravenous BID   Continuous Infusions: . sodium chloride 10 mL/hr at 06/22/15 1800  . feeding supplement (VITAL HIGH PROTEIN) 1,000 mL (06/22/15 2000)  . fentaNYL infusion INTRAVENOUS 150 mcg/hr (06/23/15 0745)  . norepinephrine (LEVOPHED) Adult infusion 15 mcg/min (06/23/15 0200)  . dialysis replacement fluid (prismasate) 1,000 mL/hr at  06/23/15 0558  . dialysis replacement fluid (prismasate) 400 mL/hr at 06/23/15 0836  . dialysate (PRISMASATE) 2,000 mL/hr at 06/23/15 0841   PRN Meds:.acetaminophen (TYLENOL) oral liquid 160 mg/5 mL, albuterol, fentaNYL, guaiFENesin, lidocaine (PF), lidocaine-prilocaine, midazolam, ondansetron, pentafluoroprop-tetrafluoroeth, sodium chloride flush  ABG    Component Value Date/Time   PHART 7.458* 06/21/2015 2158   PCO2ART 41.5 06/21/2015 2158   PO2ART 529.0* 06/21/2015 2158   HCO3 29.4* 06/21/2015 2158   TCO2 31 06/21/2015 2158   ACIDBASEDEF 3.9* 06/12/2015 0535   O2SAT 100.0 06/21/2015 2158    A 1. Anuric Dialysis dependent AKI with ATN, low likelihood of recovery 1. BL SCr 1.7-1.9  2. 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 Hemorrhagic Shock 2/2 ABLA 2/2 R perinephric hematoma s/p coil embolization 3. UTI with K Pneumoniae s/p ABX, completed 5/10 4. HCAP 5/16 back on ABX with VDRF 5. Hx/o L nephrectomy for RCC 6. AFib 7. 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 Hypervolema / Anasarca -- tolerating UF 9. Hypophosphatemia 2/2 CRRT 10. HTN 11. Anemia 12.  DM2   P 1. Cont CRRT, pull fluid as able 2. Wouldn't push pressors to inc UF  RPearson Grippe MD [employer] [phone]

## 2015-06-23 NOTE — Progress Notes (Signed)
ANTICOAGULATION CONSULT NOTE - Initial Consult  Pharmacy Consult for heparin Indication: DVT  No Known Allergies  Patient Measurements: Height: 5\' 9"  (175.3 cm) Weight: 261 lb 7.5 oz (118.6 kg) IBW/kg (Calculated) : 70.7 Heparin Dosing Weight: 97.5 kg  Vital Signs: Temp: 98.3 F (36.8 C) (05/18 0824) Temp Source: Oral (05/18 0824) BP: 100/69 mmHg (05/18 0900) Pulse Rate: 73 (05/18 0900)  Labs:  Recent Labs  06/21/15 0425  06/22/15 0328 06/22/15 0329 06/22/15 1646 06/23/15 0325  HGB 8.4*  --  8.1*  --   --  8.1*  HCT 25.7*  --  26.0*  --   --  26.3*  PLT 177  --  175  --   --  181  CREATININE 1.44*  < >  --  1.35* 1.18 1.26*  < > = values in this interval not displayed.  Estimated Creatinine Clearance: 66.4 mL/min (by C-G formula based on Cr of 1.26).   Medical History: Past Medical History  Diagnosis Date  . Obesity   . Phlebitis     Lower extermity  . Pulmonary emboli ([place]) 2008    submassive, saddle  . Prostate cancer ([place]) 07/2009  . Sleep apnea     on CPAP  . Hx of echocardiogram 12/04/2010    Normal EF >55% no significant valve disease  . History of stress test 06/27/2009    Low risk and EF of approximately 50%  . DVT (deep venous thrombosis) ([place])   . Chronic kidney disease, stage 3     baseline creatinine ~1.4  . HLD (hyperlipidemia)   . HTN (hypertension)   . Dysrhythmia     A fib  . Diabetes mellitus ([place])     diet controlled  . History of hiatal hernia     Medications:  Prescriptions prior to admission  Medication Sig Dispense Refill Last Dose  . carvedilol (COREG) 3.125 MG tablet take 1 tablet by mouth twice a day with meals 60 tablet 5 05/30/2015 at 8p  . fenofibrate (TRICOR) 145 MG tablet Take 145 mg by mouth daily.    05/30/2015 at Unknown time  . ferrous sulfate 325 (65 FE) MG EC tablet take 1 tablet by mouth once daily 30 tablet 6 05/30/2015 at Unknown time  . furosemide (LASIX) 40 MG tablet take 1 tablet by mouth once daily 30 tablet  6 05/30/2015 at Unknown time  . HYDROcodone-acetaminophen (NORCO/VICODIN) 5-325 MG tablet Take 1-2 tablets by mouth every 4 (four) hours as needed (breakthrough pain). 80 tablet 0 05/30/2015 at Unknown time  . Niacin CR 1000 MG TBCR Take 1,000 mg by mouth at bedtime.    05/30/2015 at Unknown time  . polyvinyl alcohol (LIQUIFILM TEARS) 1.4 % ophthalmic solution Place 1 drop into both eyes 2 (two) times daily as needed for dry eyes.   05/30/2015 at Unknown time  . potassium chloride (K-DUR,KLOR-CON) 10 MEQ tablet take 1 tablet by mouth once daily 30 tablet 5 05/30/2015 at Unknown time  . quinapril (ACCUPRIL) 20 MG tablet Take 20 mg by mouth daily.   0 05/30/2015 at Unknown time  . warfarin (COUMADIN) 1 MG tablet Take 1 mg by mouth every Monday, Wednesday, and Friday.   05/30/2015 at Unknown time  . warfarin (COUMADIN) 5 MG tablet Take 5-6 mg by mouth daily. 6 mg on Monday, Wednesday, and Friday.  Take 5 mg on Tuesday, Thursday, Saturday, and Sunday.   05/30/2015 at Unknown time    Assessment:  75 yo male with acute DVT in  gastrocnemius vein. Pt is on warfarin PTA for hx of DVT / PE.  PTA warfarin dose: 6 mg q Mon/Wed/Fri, 5 mg all other days. Pt has had prior complications with anticoagulation. He reportedly has chronic hematuria.  He also has a perinephric hematoma, this increased in size and caused the pt to go into hemorrhagic shock in the beginning of the month.  IR successfully emoblized the kidney area on 5/3. Since then, all anticoagulation has been held. Pt has become more stable over the past two weeks and now with finding of acute DVT, we will resume a heparin infusion.  Pt became thrombocytopenic around 5/8-5/9. HIT panel was sent off and was negative. hgb 8.1, plts wnl. Pt is s/p multiple units of FFP and pRBC during this admission. Pt has a history of L nephrectomy due to [place] and has been on CRRT since 5/3.   Goal of Therapy:  Heparin level 0.3-0.5 units/mL  Monitor platelets by anticoagulation  protocol: Yes    Plan:  -Heparin 4000 units x1 then 1500 units/hr -Daily HL, CBC -First level this afternoon  -Monitor s/sx bleeding closely    [name] 06/23/2015,9:57 AM

## 2015-06-23 NOTE — Care Management Note (Signed)
Case Management Note  Patient Details  Name: [name] MRN: [id number] Date of Birth: Sep 10, 1940  Subjective/Objective:    Pt originally admitted with septic shock - then found to have perinephric hematoma                 Action/Plan:   06/23/2015  Pt reintubated overnight with continuous sedation and pressor.  Continuous CRRT  06/20/15 Pt remains on CRRT.  Pressor reinitiated over weekend.  CM will continue to follow for discharge needs  06/17/15 Pt remains ventilated on sedation with tube feeds.  Pt remains on CRRT - HD catheter placed in IR 06/17/15.  CM consulted with attending  regarding appropriateness for [facility] referral; pt does not appear to be appropriate for LTACH at this time - will revisit appropriateness next week   06/09/15: Pt is from home with wife - now intubated s/p IR procedure.  CM will continue to monitor for disposition needs   Expected Discharge Date:                  Expected Discharge Plan:  [place]  In-House Referral:  Clinical Social Work  Discharge planning Services  CM Consult  Post Acute Care Choice:    Choice offered to:     DME Arranged:    DME Agency:     HH Arranged:    [place] Agency:     Status of Service:  In process, will continue to follow  Medicare Important Message Given:    Date Medicare IM Given:    Medicare IM give by:    Date Additional Medicare IM Given:    Additional Medicare Important Message give by:     If discussed at [place] of Stay Meetings, dates discussed:    Additional Comments: CSW consulted for CIR back up plan Maryclare Labrador, RN 06/23/2015, 9:07 AM

## 2015-06-23 NOTE — Progress Notes (Signed)
PULMONARY / CRITICAL CARE MEDICINE   Name: [name] MRN: [id number] DOB: 20-Jul-1940    ADMISSION DATE:  05/31/2015  REFERRING MD:  EDP  CHIEF COMPLAINT:  Hypotension   SUBJECTIVE:  Remains on pressors.  VITAL SIGNS: BP 91/70 mmHg  Pulse 73  Temp(Src) 98.3 F (36.8 C) (Oral)  Resp 16  Ht 5\' 9"  (1.753 m)  Wt 261 lb 7.5 oz (118.6 kg)  BMI 38.59 kg/m2  SpO2 100%  INTAKE / OUTPUT: I/O last 3 completed shifts: In: 5656.2 [I.V.:1734.2; NG/GT:2670; IV Piggyback:1252] Out: 8836 [Other:8836]  PHYSICAL EXAMINATION: General: alert Neuro: RASS 0, follows commands Cardiac: regular Chest: no wheeze Abd: soft, non tender Ext: 1+ edema Skin: no rashes  LABS:  BMET  Recent Labs Lab 06/22/15 0329 06/22/15 1646 06/23/15 0325  NA 137 136 135  K 4.2 4.4 5.0  CL 102 100* 99*  CO2 26 27 26   BUN 29* 28* 32*  CREATININE 1.35* 1.18 1.26*  GLUCOSE 253* 267* 215*    Electrolytes  Recent Labs Lab 06/21/15 0425  06/22/15 0328 06/22/15 0329 06/22/15 1646 06/23/15 0325  CALCIUM 8.3*  < >  --  8.0* 8.2* 8.0*  MG 2.4  --  2.5*  --   --  2.2  PHOS 2.2*  < >  --  2.3* 2.5 2.8  < > = values in this interval not displayed.  CBC  Recent Labs Lab 06/21/15 0425 06/22/15 0328 06/23/15 0325  WBC 6.1 11.2* 13.4*  HGB 8.4* 8.1* 8.1*  HCT 25.7* 26.0* 26.3*  PLT 177 175 181    Coag's  Recent Labs Lab 06/17/15 0507  INR 1.57*    Sepsis Markers No results for input(s): LATICACIDVEN, PROCALCITON, O2SATVEN in the last 168 hours.  ABG  Recent Labs Lab 06/16/15 0945 06/21/15 1947 06/21/15 2158  PHART 7.372 7.474* 7.458*  PCO2ART 43.8 34.7* 41.5  PO2ART 142.0* 81.0 529.0*    Liver Enzymes  Recent Labs Lab 06/22/15 0329 06/22/15 1646 06/23/15 0325  ALBUMIN 2.5* 2.5* 2.4*    Cardiac Enzymes No results for input(s): TROPONINI, PROBNP in the last 168 hours.  Glucose  Recent Labs Lab 06/22/15 0744 06/22/15 1122 06/22/15 1527 06/22/15 2012  06/22/15 2344 06/23/15 0349  GLUCAP 203* 223* 208* 206* 173* 195*    Imaging Dg Chest Port 1 View  06/23/2015  CLINICAL DATA:  Respiratory failure. EXAM: PORTABLE CHEST 1 VIEW COMPARISON:  06/22/2015. FINDINGS: Feeding tube large caliber right IJ line, left subclavian central line in stable position. Stable cardiomegaly. Low lung volumes with bibasilar atelectasis and/or infiltrates again noted. No pleural effusion or pneumothorax. IMPRESSION: 1. Lines and tubes in stable position. 2. Low lung volumes with bibasilar atelectasis and/or infiltrates again noted without interim change. 3. Stable cardiomegaly. Electronically Signed   By: Marcello Moores  Register   On: 06/23/2015 07:06   Dg Chest Port 1 View  06/22/2015  CLINICAL DATA:  Status post central line placement EXAM: PORTABLE CHEST 1 VIEW COMPARISON:  06/21/2015 FINDINGS: Dialysis catheter, endotracheal tube and feeding catheter are again seen and stable. A new left subclavian central line is noted with the tip in the proximal superior vena cava. No pneumothorax is seen. Cardiac shadow is stable. The lungs are stable as well. IMPRESSION: No pneumothorax following central line placement. No acute abnormality is seen. Electronically Signed   By: Inez Catalina M.D.   On: 06/22/2015 14:27     STUDIES:  4/26 Renal u/s >> s/p Lt nephrectomy 4/27 CT abd/pelvis >> Rt perinephric  hematoma 5/02 CT abd/pelvis >> increased size of hematoma 5/08 Echo >> EF 65 to 70%  CULTURES: 4/25 Blood >> Klebsiella pneumoniae 4/27 Blood >> negative 5/16 BAL >>  ANTIBIOTICS: 4/28 Rocephin >> 5/10  SIGNIFICANT EVENTS: 4/25 Admitted with septic shock due to UTI. Significant ARF. 4/30 transferred out of ICU. 5/03 Cardiac arrest, anemia >> coil embolization Rt kidney by IR; PTX post-CPR 5/16 Reintubated due to airway secretions  LINES/TUBES: 4/26 Rt IJ HD cath >. 5/12 5/03 ETT >> 5/13 5/03 Lt femoral CVL >> 5/17 5/03 Lt chest tube >> 5/14 5/03 Rt chest tube >>  5/14 5/12 Rt IJ tunneled HD cath >>  5/16 ETT >>  5/17 Lt [place] CVL >>   DISCUSSION: 75 yo male with hemorrhagic shock leading to cardiac arrest, VDRF, AKI from Rt perinephric hematoma.  He has hx of Lt renal cell carcinoma s/p Lt nephrectomy, prostate cancer s/p XRT, PE in 2008, OSA, HTN, HLD, DM, A fib.  ASSESSMENT / PLAN:  PULMONARY A: Acute hypoxic respiratory failure in setting of cardiac arrest. Pneumothorax after CPR 5/03 >> chest tubes removed 5/14. Hx of OSA. Re-intubated 5/16 due to respiratory secretions. P:  Pressure support wean as tolerated F/u CXR Might need trach  CARDIOVASCULAR A: Hemorrhagic shock 2nd to Rt perinephric hematoma. Hx of A fib, HTN, HLD. P:  Pressors to keep MAP > 65 Hold outpt coreg, tricor, lasix, niacin, accupril No coumadin for now in setting of hematoma Added midodrine 5/17  RENAL A:  AKI with hx of Stage III CKD. S/p Lt nephrectomy for RCC. P:  CRRT per nephrology >> goal even fluid balance  GASTROINTESTINAL A:  Nutrition. Dysphagia. P:  Continue tube feeds Protonix for SUP  HEMATOLOGIC / ONCOLOGIC A: Acute blood loss anemia 2nd to Rt perinephric hematoma. Anemia of critical illness and chronic disease. P:  F/u CBC SCDs  INFECTIOUS A:  Septic shock with Klebsiella bacteremia >> completed Abx 5/10. P:  Monitor fever curve  ENDOCRINE A:  DM. P:  Continue SSI  NEUROLOGIC A:  Acute encephalopathy 2nd to sepsis, shock, respiratory failure, renal failure >> improved. Deconditioning. P:  RASS goal 0 to -1  CC time 31 minutes.  Chesley Mires, MD [facility] Pulmonary/Critical Care 06/23/2015, 8:25 AM Pager:  [phone] After 3pm call: [phone]

## 2015-06-24 ENCOUNTER — Inpatient Hospital Stay (HOSPITAL_COMMUNITY): Payer: BLUE CROSS/BLUE SHIELD

## 2015-06-24 LAB — GLUCOSE, CAPILLARY
GLUCOSE-CAPILLARY: 214 mg/dL — AB (ref 65–99)
GLUCOSE-CAPILLARY: 251 mg/dL — AB (ref 65–99)
GLUCOSE-CAPILLARY: 313 mg/dL — AB (ref 65–99)
Glucose-Capillary: 219 mg/dL — ABNORMAL HIGH (ref 65–99)
Glucose-Capillary: 266 mg/dL — ABNORMAL HIGH (ref 65–99)
Glucose-Capillary: 248 mg/dL — ABNORMAL HIGH (ref 65–99)

## 2015-06-24 LAB — RENAL FUNCTION PANEL
ALBUMIN: 2.6 g/dL — AB (ref 3.5–5.0)
ALBUMIN: 2.5 g/dL — AB (ref 3.5–5.0)
ANION GAP: 13 (ref 5–15)
Anion gap: 12 (ref 5–15)
BUN: 36 mg/dL — AB (ref 6–20)
BUN: 37 mg/dL — AB (ref 6–20)
CHLORIDE: 99 mmol/L — AB (ref 101–111)
CO2: 26 mmol/L (ref 22–32)
CO2: 24 mmol/L (ref 22–32)
CREATININE: 1.32 mg/dL — AB (ref 0.61–1.24)
CREATININE: 1.38 mg/dL — AB (ref 0.61–1.24)
Calcium: 8.8 mg/dL — ABNORMAL LOW (ref 8.9–10.3)
Calcium: 9.1 mg/dL (ref 8.9–10.3)
Chloride: 101 mmol/L (ref 101–111)
GFR calc Af Amer: >60 mL/min (ref >60)
GFR calc Af Amer: 57 mL/min — ABNORMAL LOW (ref >60)
GFR, EST NON AFRICAN AMERICAN: 52 mL/min — AB (ref >60)
GFR, EST NON AFRICAN AMERICAN: 49 mL/min — AB (ref >60)
GLUCOSE: 238 mg/dL — AB (ref 65–99)
GLUCOSE: 271 mg/dL — AB (ref 65–99)
PHOSPHORUS: 3.1 mg/dL (ref 2.5–4.6)
POTASSIUM: 5.2 mmol/L — AB (ref 3.5–5.1)
Phosphorus: 3 mg/dL (ref 2.5–4.6)
Potassium: 4.8 mmol/L (ref 3.5–5.1)
SODIUM: 138 mmol/L (ref 135–145)
Sodium: 137 mmol/L (ref 135–145)

## 2015-06-24 LAB — CBC
HEMATOCRIT: 27 % — AB (ref 39.0–52.0)
Hemoglobin: 8.2 g/dL — ABNORMAL LOW (ref 13.0–17.0)
MCH: 27.9 pg (ref 26.0–34.0)
MCHC: 30.4 g/dL (ref 30.0–36.0)
MCV: 91.8 fL (ref 78.0–100.0)
PLATELETS: 183 10*3/uL (ref 150–400)
RBC: 2.94 MIL/uL — AB (ref 4.22–5.81)
RDW: 18.7 % — AB (ref 11.5–15.5)
WBC: 14.8 10*3/uL — AB (ref 4.0–10.5)

## 2015-06-24 LAB — HEPARIN LEVEL (UNFRACTIONATED): Heparin Unfractionated: 0.5 IU/mL (ref 0.30–0.70)

## 2015-06-24 LAB — CULTURE, RESPIRATORY W GRAM STAIN

## 2015-06-24 LAB — MAGNESIUM: MAGNESIUM: 2.5 mg/dL — AB (ref 1.7–2.4)

## 2015-06-24 LAB — CULTURE, RESPIRATORY: CULTURE: NORMAL

## 2015-06-24 MED ORDER — ACETAMINOPHEN 160 MG/5ML PO SOLN
650.0000 mg | Freq: Four times a day (QID) | ORAL | Status: DC | PRN
  Administered 2015-07-03 – 2015-07-13 (×3): 650 mg
  Filled 2015-06-24 (×3): qty 20.3

## 2015-06-24 MED ORDER — MIDODRINE HCL 5 MG PO TABS
10.0000 mg | ORAL_TABLET | Freq: Three times a day (TID) | ORAL | Status: DC
  Administered 2015-06-24: 5 mg via ORAL
  Administered 2015-06-24 – 2015-07-31 (×106): 10 mg via ORAL
  Filled 2015-06-24 (×103): qty 2

## 2015-06-24 MED ORDER — HEPARIN SODIUM (PORCINE) 1000 UNIT/ML DIALYSIS
1000.0000 [IU] | INTRAMUSCULAR | Status: DC | PRN
  Administered 2015-07-03: 3000 [IU] via INTRAVENOUS_CENTRAL
  Filled 2015-06-24 (×2): qty 6
  Filled 2015-06-24: qty 3

## 2015-06-24 MED ORDER — SODIUM CHLORIDE 0.9 % IJ SOLN
250.0000 [IU]/h | INTRAMUSCULAR | Status: DC
  Administered 2015-06-24 – 2015-07-02 (×11): 500 [IU]/h via INTRAVENOUS_CENTRAL
  Filled 2015-06-24 (×13): qty 2

## 2015-06-24 NOTE — Procedures (Signed)
Admit: 05/31/2015 LOS: 25  50M solitary kidney with AKI on CRRT related to perinephric hemorrhage with shock.  Current CRRT Prescription: Start Date: on/off, last start 5/3? Catheter: R IJ TUnneled catheter BFR: 150-200/h Pre Blood Pump: 300 4K DFR: 2000 4K Replacement Rate: 400 4K Goal UF: 150-223m net neg/hr Anticoagulation: start fixed dose heparin 5/19 Clotting: ~q10-12h   S: Remains on NE Intubated, awake, on PSV Cont to tolerate UF On heparin for DVT, acute  O: 05/18 0701 - 05/19 0700 In: 38242[P.O.:105; I.V.:1241; NG/GT:1690; IV Piggyback:84] Out: 6945   Filed Weights   06/22/15 0318 06/23/15 0500 06/24/15 0300  Weight: 117.5 kg (259 lb 0.7 oz) 118.6 kg (261 lb 7.5 oz) 113.9 kg (251 lb 1.7 oz)     Recent Labs Lab 06/23/15 0325 06/23/15 1600 06/24/15 0500  NA 135 135 137  K 5.0 5.3* 4.8  CL 99* 100* 99*  CO2 '26 26 26  ' GLUCOSE 215* 247* 238*  BUN 32* 33* 36*  CREATININE 1.26* 1.27* 1.32*  CALCIUM 8.0* 8.5* 8.8*  PHOS 2.8 3.0 3.1    Recent Labs Lab 06/22/15 0328 06/23/15 0325 06/24/15 0443  WBC 11.2* 13.4* 14.8*  HGB 8.1* 8.1* 8.2*  HCT 26.0* 26.3* 27.0*  MCV 91.9 92.6 91.8  PLT 175 181 183    Scheduled Meds: . antiseptic oral rinse  7 mL Mouth Rinse QID  . chlorhexidine gluconate (SAGE KIT)  15 mL Mouth Rinse BID  . sennosides  5 mL Oral BID   And  . docusate  100 mg Oral BID  . feeding supplement (PRO-STAT SUGAR FREE 64)  30 mL Per Tube TID  . insulin aspart  0-15 Units Subcutaneous Q4H  . midodrine  5 mg Oral TID AC  . pantoprazole sodium  40 mg Per Tube Q1200  . sodium chloride flush  10-40 mL Intracatheter Q12H   Continuous Infusions: . sodium chloride 10 mL/hr at 06/22/15 1800  . feeding supplement (VITAL HIGH PROTEIN) 1,000 mL (06/24/15 0730)  . fentaNYL infusion INTRAVENOUS 50 mcg/hr (06/24/15 0730)  . heparin 1,500 Units (06/24/15 0730)  . norepinephrine (LEVOPHED) Adult infusion 16 mcg/min (06/24/15 0730)  . dialysis  replacement fluid (prismasate) 1,000 mL/hr at 06/24/15 0742  . dialysis replacement fluid (prismasate) 400 mL/hr at 06/23/15 2153  . dialysate (PRISMASATE) 2,000 mL/hr at 06/24/15 0809   PRN Meds:.acetaminophen (TYLENOL) oral liquid 160 mg/5 mL, albuterol, fentaNYL, guaiFENesin, lidocaine (PF), lidocaine-prilocaine, midazolam, ondansetron, pentafluoroprop-tetrafluoroeth, sodium chloride flush  ABG    Component Value Date/Time   PHART 7.458* 06/21/2015 2158   PCO2ART 41.5 06/21/2015 2158   PO2ART 529.0* 06/21/2015 2158   HCO3 29.4* 06/21/2015 2158   TCO2 31 06/21/2015 2158   ACIDBASEDEF 3.9* 06/12/2015 0535   O2SAT 100.0 06/21/2015 2158    A 1. Anuric Dialysis dependent AKI with ATN, low likelihood of recovery 1. BL SCr 1.7-1.9  2. 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 Hemorrhagic Shock 2/2 ABLA 2/2 R perinephric hematoma s/p coil embolization 3. UTI with K Pneumoniae s/p ABX, completed 5/10 4. HCAP 5/16 back on ABX with VDRF 5. Hx/o L nephrectomy for RCC 6. AFib 7. 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 Hypervolema / Anasarca -- tolerating UF 9. Hypophosphatemia 2/2 CRRT 10. HTN 11. Anemia 12.  DM2   P 1. Cont CRRT, pull fluid as able 2. Wouldn't push pressors to inc UF 3. Add 500 IU/hr heparin to CRRT circuit, no titration  RPearson Grippe MD [facility] pgr [phone]

## 2015-06-24 NOTE — Progress Notes (Signed)
ANTICOAGULATION CONSULT NOTE - Initial Consult  Pharmacy Consult for heparin Indication: DVT  No Known Allergies  Patient Measurements: Height: 5\' 9"  (175.3 cm) Weight: 251 lb 1.7 oz (113.9 kg) IBW/kg (Calculated) : 70.7 Heparin Dosing Weight: 97.5 kg  Vital Signs: Temp: 97.5 F (36.4 C) (05/19 0821) Temp Source: Oral (05/19 0821) BP: 72/51 mmHg (05/19 0800) Pulse Rate: 74 (05/19 0800)  Labs:  Recent Labs  06/22/15 0328  06/23/15 0325 06/23/15 1600 06/23/15 1800 06/24/15 0442 06/24/15 0443 06/24/15 0500  HGB 8.1*  --  8.1*  --   --   --  8.2*  --   HCT 26.0*  --  26.3*  --   --   --  27.0*  --   PLT 175  --  181  --   --   --  183  --   HEPARINUNFRC  --   --   --   --  0.35 0.50  --   --   CREATININE  --   < > 1.26* 1.27*  --   --   --  1.32*  < > = values in this interval not displayed.  Estimated Creatinine Clearance: 62 mL/min (by C-G formula based on Cr of 1.32).   Medical History: Past Medical History  Diagnosis Date  . Obesity   . Phlebitis     Lower extermity  . Pulmonary emboli ([place]) 2008    submassive, saddle  . Prostate cancer ([place]) 07/2009  . Sleep apnea     on CPAP  . Hx of echocardiogram 12/04/2010    Normal EF >55% no significant valve disease  . History of stress test 06/27/2009    Low risk and EF of approximately 50%  . DVT (deep venous thrombosis) ([place])   . Chronic kidney disease, stage 3     baseline creatinine ~1.4  . HLD (hyperlipidemia)   . HTN (hypertension)   . Dysrhythmia     A fib  . Diabetes mellitus ([place])     diet controlled  . History of hiatal hernia     Medications:  Prescriptions prior to admission  Medication Sig Dispense Refill Last Dose  . carvedilol (COREG) 3.125 MG tablet take 1 tablet by mouth twice a day with meals 60 tablet 5 05/30/2015 at 8p  . fenofibrate (TRICOR) 145 MG tablet Take 145 mg by mouth daily.    05/30/2015 at Unknown time  . ferrous sulfate 325 (65 FE) MG EC tablet take 1 tablet by mouth once  daily 30 tablet 6 05/30/2015 at Unknown time  . furosemide (LASIX) 40 MG tablet take 1 tablet by mouth once daily 30 tablet 6 05/30/2015 at Unknown time  . HYDROcodone-acetaminophen (NORCO/VICODIN) 5-325 MG tablet Take 1-2 tablets by mouth every 4 (four) hours as needed (breakthrough pain). 80 tablet 0 05/30/2015 at Unknown time  . Niacin CR 1000 MG TBCR Take 1,000 mg by mouth at bedtime.    05/30/2015 at Unknown time  . polyvinyl alcohol (LIQUIFILM TEARS) 1.4 % ophthalmic solution Place 1 drop into both eyes 2 (two) times daily as needed for dry eyes.   05/30/2015 at Unknown time  . potassium chloride (K-DUR,KLOR-CON) 10 MEQ tablet take 1 tablet by mouth once daily 30 tablet 5 05/30/2015 at Unknown time  . quinapril (ACCUPRIL) 20 MG tablet Take 20 mg by mouth daily.   0 05/30/2015 at Unknown time  . warfarin (COUMADIN) 1 MG tablet Take 1 mg by mouth every Monday, Wednesday, and Friday.  05/30/2015 at Unknown time  . warfarin (COUMADIN) 5 MG tablet Take 5-6 mg by mouth daily. 6 mg on Monday, Wednesday, and Friday.  Take 5 mg on Tuesday, Thursday, Saturday, and Sunday.   05/30/2015 at Unknown time    Assessment:  75 yo male with acute DVT in gastrocnemius vein. Pt is on warfarin PTA for hx of DVT / PE.  PTA warfarin dose: 6 mg q Mon/Wed/Fri, 5 mg all other days. Pt has had prior complications with anticoagulation. He reportedly has chronic hematuria.  He also has a perinephric hematoma, this increased in size and caused the pt to go into hemorrhagic shock in the beginning of the month.  IR successfully emoblized the kidney area on 5/3. Since then, all anticoagulation has been held. Pt has become more stable over the past two weeks and now with finding of acute DVT, we will resume a heparin infusion.  Pt became thrombocytopenic around 5/8-5/9. HIT panel was sent off and was negative. hgb 8.1, plts wnl. Pt is s/p multiple units of FFP and pRBC during this admission. Pt has a history of L nephrectomy due to [place] and  has been on CRRT since 5/3.  Heparin level is therapeutic at 1500 units/hr. Beginning heparin with CRRT this morning. CBC stable.    Goal of Therapy:  Heparin level 0.3-0.5 units/mL  Monitor platelets by anticoagulation protocol: Yes    Plan:  -Continue heparin at 1500 units/hr -Daily HL, CBC -Monitor s/sx bleeding closely    Harvel Quale 06/24/2015,9:00 AM

## 2015-06-24 NOTE — Progress Notes (Signed)
Inpatient Diabetes Program Recommendations  AACE/ADA: New Consensus Statement on Inpatient Glycemic Control (2015)  Target Ranges:  Prepandial:   less than 140 mg/dL      Peak postprandial:   less than 180 mg/dL (1-2 hours)      Critically ill patients:  140 - 180 mg/dL   Review of Glycemic ControlResults for [name], [name] (MRN [id number]) as of 06/24/2015 09:26  Ref. Range 06/23/2015 15:02 06/23/2015 19:57 06/23/2015 23:46 06/24/2015 03:41 06/24/2015 08:17  Glucose-Capillary Latest Ref Range: 65-99 mg/dL 221 (H) 211 (H) 200 (H) 219 (H) 266 (H)    Inpatient Diabetes Program Recommendations:  Please add Novolog tube feed coverage 3 units q 4 hours or start insulin drip per ICU protocol.  Thanks, Adah Perl, RN, BC-ADM Inpatient Diabetes Coordinator Pager [phone]

## 2015-06-24 NOTE — Progress Notes (Signed)
PULMONARY / CRITICAL CARE MEDICINE   Name: CLEMMON BROOKE MRN: [id number] DOB: 23-Oct-1940    ADMISSION DATE:  05/31/2015  REFERRING MD:  EDP  CHIEF COMPLAINT:  Hypotension   SUBJECTIVE:  Remains on pressors.  Intermittent bradycardia.  VITAL SIGNS: BP 79/54 mmHg  Pulse 88  Temp(Src) 97.5 F (36.4 C) (Oral)  Resp 19  Ht 5\' 9"  (1.753 m)  Wt 251 lb 1.7 oz (113.9 kg)  BMI 37.06 kg/m2  SpO2 100%  INTAKE / OUTPUT: I/O last 3 completed shifts: In: 4973.7 [P.O.:105; I.V.:1760.7; Other:126; NG/GT:2520; IV Piggyback:462] Out: [id number] [Other:9748]  PHYSICAL EXAMINATION: General: alert Neuro: RASS 0, follows commands Cardiac: regular Chest: no wheeze Abd: soft, non tender Ext: 1+ edema Skin: no rashes  LABS:  BMET  Recent Labs Lab 06/23/15 0325 06/23/15 1600 06/24/15 0500  NA 135 135 137  K 5.0 5.3* 4.8  CL 99* 100* 99*  CO2 26 26 26   BUN 32* 33* 36*  CREATININE 1.26* 1.27* 1.32*  GLUCOSE 215* 247* 238*    Electrolytes  Recent Labs Lab 06/22/15 0328  06/23/15 0325 06/23/15 1600 06/24/15 0443 06/24/15 0500  CALCIUM  --   < > 8.0* 8.5*  --  8.8*  MG 2.5*  --  2.2  --  2.5*  --   PHOS  --   < > 2.8 3.0  --  3.1  < > = values in this interval not displayed.  CBC  Recent Labs Lab 06/22/15 0328 06/23/15 0325 06/24/15 0443  WBC 11.2* 13.4* 14.8*  HGB 8.1* 8.1* 8.2*  HCT 26.0* 26.3* 27.0*  PLT 175 181 183    Coag's No results for input(s): APTT, INR in the last 168 hours.  Sepsis Markers No results for input(s): LATICACIDVEN, PROCALCITON, O2SATVEN in the last 168 hours.  ABG  Recent Labs Lab 06/21/15 1947 06/21/15 2158  PHART 7.474* 7.458*  PCO2ART 34.7* 41.5  PO2ART 81.0 529.0*    Liver Enzymes  Recent Labs Lab 06/23/15 0325 06/23/15 1600 06/24/15 0500  ALBUMIN 2.4* 2.4* 2.6*    Cardiac Enzymes No results for input(s): TROPONINI, PROBNP in the last 168 hours.  Glucose  Recent Labs Lab 06/23/15 1104 06/23/15 1502  06/23/15 1957 06/23/15 2346 06/24/15 0341 06/24/15 0817  GLUCAP 224* 221* 211* 200* 219* 266*    Imaging Dg Chest Port 1 View  06/24/2015  CLINICAL DATA:  Hypoxia EXAM: PORTABLE CHEST 1 VIEW COMPARISON:  Jun 23, 2015 FINDINGS: Endotracheal tube tip is 2.4 cm above the carina. Both central catheter tips are in the superior vena cava. Feeding tube tip is below the diaphragm. No pneumothorax. There is airspace consolidation in the left lower lobe. Lungs elsewhere clear. Heart is mildly enlarged with pulmonary vascularity within normal limits. No adenopathy. No bone lesions. IMPRESSION: Persistent left lower lobe consolidation. No new opacity. Stable cardiac silhouette. Tube and catheter positions as described without pneumothorax. Electronically Signed   By: Lowella Grip III M.D.   On: 06/24/2015 07:27     STUDIES:  4/26 Renal u/s >> s/p Lt nephrectomy 4/27 CT abd/pelvis >> Rt perinephric hematoma 5/02 CT abd/pelvis >> increased size of hematoma 5/08 Echo >> EF 65 to 70% 5/18 Doppler legs b/l >> Acute DVT Rt gastrocnemius vein, age indeterminate DVT Rt femoral vein and popliteal vein  CULTURES: 4/25 Blood >> Klebsiella pneumoniae 4/27 Blood >> negative 5/16 BAL >> oral flora  ANTIBIOTICS: 4/28 Rocephin >> 5/10  SIGNIFICANT EVENTS: 4/25 Admitted with septic shock due to UTI. Significant ARF.  4/30 transferred out of ICU. 5/03 Cardiac arrest, anemia >> coil embolization Rt kidney by IR; PTX post-CPR 5/16 Reintubated due to airway secretions 5/18 start heparin gtt  LINES/TUBES: 4/26 Rt IJ HD cath >. 5/12 5/03 ETT >> 5/13 5/03 Lt femoral CVL >> 5/17 5/03 Lt chest tube >> 5/14 5/03 Rt chest tube >> 5/14 5/12 Rt IJ tunneled HD cath >>  5/16 ETT >>  5/17 Lt [place] CVL >>   DISCUSSION: 75 yo male with hemorrhagic shock leading to cardiac arrest, VDRF, AKI from Rt perinephric hematoma.  He has hx of Lt renal cell carcinoma s/p Lt nephrectomy, prostate cancer s/p XRT, PE in  2008, OSA, HTN, HLD, DM, A fib.  ASSESSMENT / PLAN:  PULMONARY A: Acute hypoxic respiratory failure in setting of cardiac arrest. Pneumothorax after CPR 5/03 >> chest tubes removed 5/14. Hx of OSA. Re-intubated 5/16 due to respiratory secretions. P:  Pressure support wean as tolerated F/u CXR Might need trach  CARDIOVASCULAR A: Hemorrhagic shock 2nd to Rt perinephric hematoma. Hx of A fib, HTN, HLD. Acute DVT. P:  Pressors to keep MAP > 65 Hold outpt coreg, tricor, lasix, niacin, accupril Continue heparin gtt >> consider resuming coumadin if Hb remains stable Increase midodrine 5/19  RENAL A:  AKI with hx of Stage III CKD. S/p Lt nephrectomy for RCC. P:  CRRT per nephrology >> goal even fluid balance  GASTROINTESTINAL A:  Nutrition. Dysphagia. P:  Continue tube feeds Protonix for SUP  HEMATOLOGIC / ONCOLOGIC A: Acute blood loss anemia 2nd to Rt perinephric hematoma. Anemia of critical illness and chronic disease. P:  F/u CBC SCDs  INFECTIOUS A:  Septic shock with Klebsiella bacteremia >> completed Abx 5/10. P:  Monitor fever curve  ENDOCRINE A:  DM. P:  Continue SSI  NEUROLOGIC A:  Acute encephalopathy 2nd to sepsis, shock, respiratory failure, renal failure >> improved. Deconditioning. P:  RASS goal 0 to -1  Updated pt's wife at bedside.  CC time 31 minutes.  Chesley Mires, MD [facility] Pulmonary/Critical Care 06/24/2015, 10:23 AM Pager:  [phone] After 3pm call: [phone]

## 2015-06-25 ENCOUNTER — Inpatient Hospital Stay (HOSPITAL_COMMUNITY): Payer: BLUE CROSS/BLUE SHIELD

## 2015-06-25 LAB — GLUCOSE, CAPILLARY
GLUCOSE-CAPILLARY: 213 mg/dL — AB (ref 65–99)
GLUCOSE-CAPILLARY: 263 mg/dL — AB (ref 65–99)
Glucose-Capillary: 228 mg/dL — ABNORMAL HIGH (ref 65–99)
Glucose-Capillary: 234 mg/dL — ABNORMAL HIGH (ref 65–99)
Glucose-Capillary: 250 mg/dL — ABNORMAL HIGH (ref 65–99)

## 2015-06-25 LAB — CBC
HCT: 28.1 % — ABNORMAL LOW (ref 39.0–52.0)
HEMOGLOBIN: 8.4 g/dL — AB (ref 13.0–17.0)
MCH: 27.5 pg (ref 26.0–34.0)
MCHC: 29.9 g/dL — AB (ref 30.0–36.0)
MCV: 92.1 fL (ref 78.0–100.0)
Platelets: 280 10*3/uL (ref 150–400)
RBC: 3.05 MIL/uL — AB (ref 4.22–5.81)
RDW: 19.1 % — ABNORMAL HIGH (ref 11.5–15.5)
WBC: 23 10*3/uL — AB (ref 4.0–10.5)

## 2015-06-25 LAB — RENAL FUNCTION PANEL
Albumin: 2.6 g/dL — ABNORMAL LOW (ref 3.5–5.0)
Albumin: 2.4 g/dL — ABNORMAL LOW (ref 3.5–5.0)
Anion gap: 12 (ref 5–15)
Anion gap: 10 (ref 5–15)
BUN: 37 mg/dL — ABNORMAL HIGH (ref 6–20)
BUN: 38 mg/dL — ABNORMAL HIGH (ref 6–20)
CO2: 26 mmol/L (ref 22–32)
CO2: 26 mmol/L (ref 22–32)
Calcium: 9 mg/dL (ref 8.9–10.3)
Calcium: 8.9 mg/dL (ref 8.9–10.3)
Chloride: 99 mmol/L — ABNORMAL LOW (ref 101–111)
Chloride: 100 mmol/L — ABNORMAL LOW (ref 101–111)
Creatinine, Ser: 1.37 mg/dL — ABNORMAL HIGH (ref 0.61–1.24)
Creatinine, Ser: 1.32 mg/dL — ABNORMAL HIGH (ref 0.61–1.24)
GFR calc Af Amer: 57 mL/min — ABNORMAL LOW
GFR calc Af Amer: >60 mL/min
GFR calc non Af Amer: 50 mL/min — ABNORMAL LOW
GFR calc non Af Amer: 52 mL/min — ABNORMAL LOW
Glucose, Bld: 232 mg/dL — ABNORMAL HIGH (ref 65–99)
Glucose, Bld: 249 mg/dL — ABNORMAL HIGH (ref 65–99)
Phosphorus: 2.6 mg/dL (ref 2.5–4.6)
Phosphorus: 2.3 mg/dL — ABNORMAL LOW (ref 2.5–4.6)
Potassium: 5 mmol/L (ref 3.5–5.1)
Potassium: 5 mmol/L (ref 3.5–5.1)
Sodium: 137 mmol/L (ref 135–145)
Sodium: 136 mmol/L (ref 135–145)

## 2015-06-25 LAB — HEPARIN LEVEL (UNFRACTIONATED)
HEPARIN UNFRACTIONATED: 0.99 [IU]/mL — AB (ref 0.30–0.70)
HEPARIN UNFRACTIONATED: 0.66 [IU]/mL (ref 0.30–0.70)

## 2015-06-25 LAB — MAGNESIUM: MAGNESIUM: 2.4 mg/dL (ref 1.7–2.4)

## 2015-06-25 MED ORDER — HEPARIN (PORCINE) IN NACL 100-0.45 UNIT/ML-% IJ SOLN
1450.0000 [IU]/h | INTRAMUSCULAR | Status: DC
  Administered 2015-06-26: 1150 [IU]/h via INTRAVENOUS
  Administered 2015-06-27 – 2015-06-28 (×3): 1200 [IU]/h via INTRAVENOUS
  Administered 2015-06-29 – 2015-07-03 (×5): 1100 [IU]/h via INTRAVENOUS
  Administered 2015-07-05 – 2015-07-11 (×8): 1450 [IU]/h via INTRAVENOUS
  Filled 2015-06-25 (×34): qty 250

## 2015-06-25 MED ORDER — SODIUM CHLORIDE 0.9 % IV SOLN: INTRAVENOUS | Status: DC | PRN

## 2015-06-25 NOTE — Progress Notes (Signed)
Pt is sleeping comfortably at this time no distress noted.  

## 2015-06-25 NOTE — Progress Notes (Signed)
PULMONARY / CRITICAL CARE MEDICINE   Name: [name] MRN: [id number] DOB: 1940/05/07    ADMISSION DATE:  05/31/2015  REFERRING MD:  EDP  CHIEF COMPLAINT:  Hypotension   SUBJECTIVE:  Remains on pressors.  Intermittent bradycardia.  Tolerating SBT.  VITAL SIGNS: BP 83/62 mmHg  Pulse 80  Temp(Src) 97.5 F (36.4 C) (Oral)  Resp 16  Ht 5\' 9"  (1.753 m)  Wt 240 lb 1.3 oz (108.9 kg)  BMI 35.44 kg/m2  SpO2 100%  INTAKE / OUTPUT: I/O last 3 completed shifts: In: 4381 [P.O.:50; I.V.:1931; NG/GT:2400] Out: 8590 [Other:8590]  PHYSICAL EXAMINATION: General: alert Neuro: RASS 0, follows commands Cardiac: regular Chest: no wheeze Abd: soft, non tender Ext: 1+ edema Skin: no rashes  LABS:  BMET  Recent Labs Lab 06/24/15 0500 06/24/15 1600 06/25/15 0444  NA 137 138 137  K 4.8 5.2* 5.0  CL 99* 101 99*  CO2 26 24 26   BUN 36* 37* 37*  CREATININE 1.32* 1.38* 1.37*  GLUCOSE 238* 271* 232*    Electrolytes  Recent Labs Lab 06/23/15 0325  06/24/15 0443 06/24/15 0500 06/24/15 1600 06/25/15 0444  CALCIUM 8.0*  < >  --  8.8* 9.1 9.0  MG 2.2  --  2.5*  --   --  2.4  PHOS 2.8  < >  --  3.1 3.0 2.6  < > = values in this interval not displayed.  CBC  Recent Labs Lab 06/23/15 0325 06/24/15 0443 06/25/15 0444  WBC 13.4* 14.8* 23.0*  HGB 8.1* 8.2* 8.4*  HCT 26.3* 27.0* 28.1*  PLT 181 183 280    Coag's No results for input(s): APTT, INR in the last 168 hours.  Sepsis Markers No results for input(s): LATICACIDVEN, PROCALCITON, O2SATVEN in the last 168 hours.  ABG  Recent Labs Lab 06/21/15 1947 06/21/15 2158  PHART 7.474* 7.458*  PCO2ART 34.7* 41.5  PO2ART 81.0 529.0*    Liver Enzymes  Recent Labs Lab 06/24/15 0500 06/24/15 1600 06/25/15 0444  ALBUMIN 2.6* 2.5* 2.6*    Cardiac Enzymes No results for input(s): TROPONINI, PROBNP in the last 168 hours.  Glucose  Recent Labs Lab 06/24/15 1123 06/24/15 1554 06/24/15 1921 06/24/15 2347  06/25/15 0308 06/25/15 0743  GLUCAP 248* 251* 214* 313* 213* 228*    Imaging Dg Chest Port 1 View  06/25/2015  CLINICAL DATA:  Respiratory failure.  Subsequent encounter. EXAM: PORTABLE CHEST 1 VIEW COMPARISON:  06/24/2015 FINDINGS: Endotracheal tube, left subclavian central venous line, right-sided large bore central venous catheter and oral/nasogastric tube are stable and well positioned. Left greater than right lung base opacity is also stable. Left base opacity may reflect pneumonia, atelectasis or a combination. Right base opacity is most likely atelectasis. Remainder of the lungs is clear. No convincing pleural effusion or pneumothorax. IMPRESSION: 1. No significant change from the previous day's study. 2. Left left lung base opacity consistent with pneumonia, atelectasis or a combination. Mild right basilar atelectasis. 3. Support apparatus is stable and well positioned. Electronically Signed   By: Lajean Manes M.D.   On: 06/25/2015 10:37     STUDIES:  4/26 Renal u/s >> s/p Lt nephrectomy 4/27 CT abd/pelvis >> Rt perinephric hematoma 5/02 CT abd/pelvis >> increased size of hematoma 5/08 Echo >> EF 65 to 70% 5/18 Doppler legs b/l >> Acute DVT Rt gastrocnemius vein, age indeterminate DVT Rt femoral vein and popliteal vein  CULTURES: 4/25 Blood >> Klebsiella pneumoniae 4/27 Blood >> negative 5/16 BAL >> oral flora  ANTIBIOTICS:  4/28 Rocephin >> 5/10  SIGNIFICANT EVENTS: 4/25 Admitted with septic shock due to UTI. Significant ARF. 4/30 transferred out of ICU. 5/03 Cardiac arrest, anemia >> coil embolization Rt kidney by IR; PTX post-CPR 5/16 Reintubated due to airway secretions 5/18 start heparin gtt  LINES/TUBES: 4/26 Rt IJ HD cath >. 5/12 5/03 ETT >> 5/13 5/03 Lt femoral CVL >> 5/17 5/03 Lt chest tube >> 5/14 5/03 Rt chest tube >> 5/14 5/12 Rt IJ tunneled HD cath >>  5/16 ETT >>  5/17 Lt [place] CVL >>   DISCUSSION: 75 yo male with hemorrhagic shock leading to cardiac  arrest, VDRF, AKI from Rt perinephric hematoma.  He has hx of Lt renal cell carcinoma s/p Lt nephrectomy, prostate cancer s/p XRT, PE in 2008, OSA, HTN, HLD, DM, A fib.  ASSESSMENT / PLAN:  PULMONARY A: Acute hypoxic respiratory failure in setting of cardiac arrest. Pneumothorax after CPR 5/03 >> chest tubes removed 5/14. Hx of OSA. Re-intubated 5/16 due to respiratory secretions. P:  Pressure support wean as tolerated >> defer extubation for now F/u CXR intermittently Might need trach  CARDIOVASCULAR A: Hemorrhagic shock 2nd to Rt perinephric hematoma. Hx of A fib, HTN, HLD. Acute DVT. ?if cuff pressure readings are accurate. P:  Pressors to keep MAP > 65 Place a line Hold outpt coreg, tricor, lasix, niacin, accupril Continue heparin gtt >> consider resuming coumadin if Hb remains stable Increased midodrine 5/19  RENAL A:  AKI with hx of Stage III CKD. S/p Lt nephrectomy for RCC. P:  CRRT per nephrology >> goal even fluid balance  GASTROINTESTINAL A:  Nutrition. Dysphagia. P:  Continue tube feeds Protonix for SUP  HEMATOLOGIC / ONCOLOGIC A: Acute blood loss anemia 2nd to Rt perinephric hematoma. Anemia of critical illness and chronic disease. P:  F/u CBC SCDs  INFECTIOUS A:  Septic shock with Klebsiella bacteremia >> completed Abx 5/10. P:  Monitor fever curve  ENDOCRINE A:  DM. P:  Continue SSI  NEUROLOGIC A:  Acute encephalopathy 2nd to sepsis, shock, respiratory failure, renal failure >> improved. Deconditioning. P:  RASS goal 0 to -1  Updated pt's wife at bedside.  CC time 31 minutes.  Chesley Mires, MD [facility] Pulmonary/Critical Care 06/25/2015, 11:11 AM Pager:  [phone] After 3pm call: [phone]

## 2015-06-25 NOTE — Progress Notes (Addendum)
ANTICOAGULATION CONSULT NOTE - Follow-up Consult  Pharmacy Consult for heparin Indication: DVT  No Known Allergies  Patient Measurements: Height: 5\' 9"  (175.3 cm) Weight: 240 lb 1.3 oz (108.9 kg) IBW/kg (Calculated) : 70.7 Heparin Dosing Weight: 97.5 kg  Vital Signs: Temp: 97.4 F (36.3 C) (05/20 0306) Temp Source: Oral (05/20 0306) BP: 84/54 mmHg (05/20 0500) Pulse Rate: 75 (05/20 0430)  Labs:  Recent Labs  06/23/15 0325  06/23/15 1800 06/24/15 0442 06/24/15 0443 06/24/15 0500 06/24/15 1600 06/25/15 0444  HGB 8.1*  --   --   --  8.2*  --   --  8.4*  HCT 26.3*  --   --   --  27.0*  --   --  28.1*  PLT 181  --   --   --  183  --   --  280  HEPARINUNFRC  --   --  0.35 0.50  --   --   --  0.99*  CREATININE 1.26*  < >  --   --   --  1.32* 1.38* 1.37*  < > = values in this interval not displayed.  Estimated Creatinine Clearance: 58.4 mL/min (by C-G formula based on Cr of 1.37).   Assessment: 75 yo male with acute DVT in gastrocnemius vein. Pt is on warfarin PTA for hx of DVT / PE.  PTA warfarin dose: 6 mg q Mon/Wed/Fri, 5 mg all other days. Pt has had prior complications with anticoagulation. He reportedly has chronic hematuria.  He also has a perinephric hematoma, this increased in size and caused the pt to go into hemorrhagic shock in the beginning of the month.  IR successfully emoblized the kidney area on 5/3. All anticoagulation had been held until 5/18 when heparin resumed for acute DVT. Heparin level now supratherapeutic on 1500 units/hr. No bleeding noted. Noted pt also receiving heparin with CRRT.  Goal of Therapy:  Heparin level 0.3-0.5 units/mL  Monitor platelets by anticoagulation protocol: Yes    Plan:  -Decrease heparin to 1250 units/hr -F/u 8 hr heparin level  Sherlon Handing, PharmD, BCPS Clinical pharmacist, pager [phone] 06/25/2015,5:26 AM   Addendum: Heparin level has decreased but still remains above goal at 0.66. Hgb remains low but stable.  Decrease rate a little more to 1150 units/hr and check another 8 hour heparin level.  Nena Jordan, PharmD, BCPS 06/25/2015, 2:00 PM

## 2015-06-25 NOTE — Procedures (Signed)
Admit: 05/31/2015 LOS: 25  59M solitary kidney with AKI on CRRT related to perinephric hemorrhage with shock.  Current CRRT Prescription: Start Date: on/off, last start 5/3? Catheter: R IJ TUnneled catheter BFR: 150-200/h Pre Blood Pump: 300 4K DFR: 2000 4K Replacement Rate: 400 4K Goal UF: 150-216m net neg/hr Anticoagulation: start fixed dose heparin 5/19 Clotting: ~q10-12h   S: Net negative yesterday No UOP K 5.0, Phos 2.6 Hypotensive on NE at 35   O: 05/19 0701 - 05/20 0700 In: 2907.6 [I.V.:1307.6; NG/GT:1600] Out: 5118   Filed Weights   06/23/15 0500 06/24/15 0300 06/25/15 0300  Weight: 118.6 kg (261 lb 7.5 oz) 113.9 kg (251 lb 1.7 oz) 108.9 kg (240 lb 1.3 oz)     Recent Labs Lab 06/24/15 0500 06/24/15 1600 06/25/15 0444  NA 137 138 137  K 4.8 5.2* 5.0  CL 99* 101 99*  CO2 '26 24 26  ' GLUCOSE 238* 271* 232*  BUN 36* 37* 37*  CREATININE 1.32* 1.38* 1.37*  CALCIUM 8.8* 9.1 9.0  PHOS 3.1 3.0 2.6    Recent Labs Lab 06/23/15 0325 06/24/15 0443 06/25/15 0444  WBC 13.4* 14.8* 23.0*  HGB 8.1* 8.2* 8.4*  HCT 26.3* 27.0* 28.1*  MCV 92.6 91.8 92.1  PLT 181 183 280    Scheduled Meds: . antiseptic oral rinse  7 mL Mouth Rinse QID  . chlorhexidine gluconate (SAGE KIT)  15 mL Mouth Rinse BID  . sennosides  5 mL Oral BID   And  . docusate  100 mg Oral BID  . feeding supplement (PRO-STAT SUGAR FREE 64)  30 mL Per Tube TID  . insulin aspart  0-15 Units Subcutaneous Q4H  . midodrine  10 mg Oral TID AC  . pantoprazole sodium  40 mg Per Tube Q1200  . sodium chloride flush  10-40 mL Intracatheter Q12H   Continuous Infusions: . sodium chloride 10 mL/hr at 06/22/15 1800  . feeding supplement (VITAL HIGH PROTEIN) 1,000 mL (06/24/15 0730)  . fentaNYL infusion INTRAVENOUS 100 mcg/hr (06/24/15 2300)  . heparin 10,000 units/ 20 mL infusion syringe 500 Units/hr (06/25/15 0700)  . heparin 1,250 Units/hr (06/25/15 0530)  . norepinephrine (LEVOPHED) Adult infusion 35  mcg/min (06/25/15 0352)  . dialysis replacement fluid (prismasate) 1,000 mL/hr at 06/25/15 0006  . dialysis replacement fluid (prismasate) 400 mL/hr at 06/25/15 0006  . dialysate (PRISMASATE) 2,000 mL/hr at 06/25/15 0504   PRN Meds:.acetaminophen (TYLENOL) oral liquid 160 mg/5 mL, albuterol, fentaNYL, guaiFENesin, heparin, lidocaine (PF), lidocaine-prilocaine, midazolam, ondansetron, pentafluoroprop-tetrafluoroeth, sodium chloride flush  ABG    Component Value Date/Time   PHART 7.458* 06/21/2015 2158   PCO2ART 41.5 06/21/2015 2158   PO2ART 529.0* 06/21/2015 2158   HCO3 29.4* 06/21/2015 2158   TCO2 31 06/21/2015 2158   ACIDBASEDEF 3.9* 06/12/2015 0535   O2SAT 100.0 06/21/2015 2158    A 1. Anuric Dialysis dependent AKI with ATN, low likelihood of recovery 1. BL SCr 1.7-1.9  2. 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 Hemorrhagic Shock 2/2 ABLA 2/2 R perinephric hematoma s/p coil embolization 3. UTI with K Pneumoniae s/p ABX, completed 5/10 4. HCAP 5/16 back on ABX with VDRF 5. Hx/o L nephrectomy for RCC 6. AFib 7. 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 Hypervolema / Anasarca -- tolerating UF 9. Hypophosphatemia 2/2 CRRT 10. HTN 11. Anemia 12.  DM2   P 1. Cont CRRT, net even  2. Wouldn't push pressors to inc UF 3. Add 500 IU/hr heparin to CRRT  circuit, no titration  Pearson Grippe, MD [employer] pgr [phone]

## 2015-06-25 NOTE — Progress Notes (Signed)
RT note-Attempted arterial line x2, unsuccessful in right radial, would not thread, good blood flow.

## 2015-06-26 LAB — RENAL FUNCTION PANEL
ANION GAP: 9 (ref 5–15)
Albumin: 2.4 g/dL — ABNORMAL LOW (ref 3.5–5.0)
Albumin: 2.4 g/dL — ABNORMAL LOW (ref 3.5–5.0)
Anion gap: 13 (ref 5–15)
BUN: 38 mg/dL — AB (ref 6–20)
BUN: 36 mg/dL — ABNORMAL HIGH (ref 6–20)
CALCIUM: 8.9 mg/dL (ref 8.9–10.3)
CHLORIDE: 97 mmol/L — AB (ref 101–111)
CHLORIDE: 101 mmol/L (ref 101–111)
CO2: 26 mmol/L (ref 22–32)
CO2: 26 mmol/L (ref 22–32)
CREATININE: 1.39 mg/dL — AB (ref 0.61–1.24)
Calcium: 8.9 mg/dL (ref 8.9–10.3)
Creatinine, Ser: 1.32 mg/dL — ABNORMAL HIGH (ref 0.61–1.24)
GFR calc Af Amer: 56 mL/min — ABNORMAL LOW (ref >60)
GFR calc Af Amer: >60 mL/min (ref >60)
GFR calc non Af Amer: 49 mL/min — ABNORMAL LOW (ref >60)
GFR calc non Af Amer: 52 mL/min — ABNORMAL LOW (ref >60)
GLUCOSE: 261 mg/dL — AB (ref 65–99)
Glucose, Bld: 299 mg/dL — ABNORMAL HIGH (ref 65–99)
POTASSIUM: 4.9 mmol/L (ref 3.5–5.1)
Phosphorus: 2.7 mg/dL (ref 2.5–4.6)
Phosphorus: 2.7 mg/dL (ref 2.5–4.6)
Potassium: 5 mmol/L (ref 3.5–5.1)
SODIUM: 136 mmol/L (ref 135–145)
Sodium: 136 mmol/L (ref 135–145)

## 2015-06-26 LAB — GLUCOSE, CAPILLARY
GLUCOSE-CAPILLARY: 257 mg/dL — AB (ref 65–99)
GLUCOSE-CAPILLARY: 228 mg/dL — AB (ref 65–99)
GLUCOSE-CAPILLARY: 227 mg/dL — AB (ref 65–99)
GLUCOSE-CAPILLARY: 263 mg/dL — AB (ref 65–99)
Glucose-Capillary: 148 mg/dL — ABNORMAL HIGH (ref 65–99)
Glucose-Capillary: 230 mg/dL — ABNORMAL HIGH (ref 65–99)

## 2015-06-26 LAB — HEPARIN LEVEL (UNFRACTIONATED)
HEPARIN UNFRACTIONATED: 0.36 [IU]/mL (ref 0.30–0.70)
Heparin Unfractionated: 0.32 IU/mL (ref 0.30–0.70)

## 2015-06-26 LAB — CBC
HCT: 27.7 % — ABNORMAL LOW (ref 39.0–52.0)
Hemoglobin: 8.2 g/dL — ABNORMAL LOW (ref 13.0–17.0)
MCH: 27.7 pg (ref 26.0–34.0)
MCHC: 29.6 g/dL — ABNORMAL LOW (ref 30.0–36.0)
MCV: 93.6 fL (ref 78.0–100.0)
PLATELETS: 274 10*3/uL (ref 150–400)
RBC: 2.96 MIL/uL — ABNORMAL LOW (ref 4.22–5.81)
RDW: 19.8 % — AB (ref 11.5–15.5)
WBC: 22.5 10*3/uL — AB (ref 4.0–10.5)

## 2015-06-26 LAB — MAGNESIUM: MAGNESIUM: 2.5 mg/dL — AB (ref 1.7–2.4)

## 2015-06-26 NOTE — Progress Notes (Signed)
PULMONARY / CRITICAL CARE MEDICINE   Name: [name] MRN: [id number] DOB: Jun 14, 1940    ADMISSION DATE:  05/31/2015  REFERRING MD:  EDP  CHIEF COMPLAINT:  Hypotension   SUBJECTIVE:  Remains on pressors.  Intermittent bradycardia.  Tolerating SBT.  VITAL SIGNS: BP 86/66 mmHg  Pulse 99  Temp(Src) 97.3 F (36.3 C) (Oral)  Resp 34  Ht 5\' 9"  (1.753 m)  Wt 240 lb 11.9 oz (109.2 kg)  BMI 35.54 kg/m2  SpO2 100%  INTAKE / OUTPUT: I/O last 3 completed shifts: In: 4520.6 [I.V.:2165.6; NG/GT:2355] Out: 5333 [id number]  PHYSICAL EXAMINATION: General: alert Neuro: RASS 0, follows commands Cardiac: regular Chest: no wheeze, paradoxical breathing pattern Abd: soft, non tender Ext: 1+ edema Skin: no rashes  LABS:  BMET  Recent Labs Lab 06/25/15 0444 06/25/15 1529 06/26/15 0500  NA 137 136 136  K 5.0 5.0 5.0  CL 99* 100* 97*  CO2 26 26 26   BUN 37* 38* 38*  CREATININE 1.37* 1.32* 1.39*  GLUCOSE 232* 249* 299*    Electrolytes  Recent Labs Lab 06/24/15 0443  06/25/15 0444 06/25/15 1529 06/26/15 0500  CALCIUM  --   < > 9.0 8.9 8.9  MG 2.5*  --  2.4  --  2.5*  PHOS  --   < > 2.6 2.3* 2.7  < > = values in this interval not displayed.  CBC  Recent Labs Lab 06/24/15 0443 06/25/15 0444 06/26/15 0500  WBC 14.8* 23.0* 22.5*  HGB 8.2* 8.4* 8.2*  HCT 27.0* 28.1* 27.7*  PLT 183 280 274    Coag's No results for input(s): APTT, INR in the last 168 hours.  Sepsis Markers No results for input(s): LATICACIDVEN, PROCALCITON, O2SATVEN in the last 168 hours.  ABG  Recent Labs Lab 06/21/15 1947 06/21/15 2158  PHART 7.474* 7.458*  PCO2ART 34.7* 41.5  PO2ART 81.0 529.0*    Liver Enzymes  Recent Labs Lab 06/25/15 0444 06/25/15 1529 06/26/15 0500  ALBUMIN 2.6* 2.4* 2.4*    Cardiac Enzymes No results for input(s): TROPONINI, PROBNP in the last 168 hours.  Glucose  Recent Labs Lab 06/25/15 1142 06/25/15 1637 06/25/15 1957 06/26/15 0014  06/26/15 0411 06/26/15 0737  GLUCAP 263* 234* 250* 148* 257* 228*    Imaging No results found.   STUDIES:  4/26 Renal u/s >> s/p Lt nephrectomy 4/27 CT abd/pelvis >> Rt perinephric hematoma 5/02 CT abd/pelvis >> increased size of hematoma 5/08 Echo >> EF 65 to 70% 5/18 Doppler legs b/l >> Acute DVT Rt gastrocnemius vein, age indeterminate DVT Rt femoral vein and popliteal vein  CULTURES: 4/25 Blood >> Klebsiella pneumoniae 4/27 Blood >> negative 5/16 BAL >> oral flora  ANTIBIOTICS: 4/28 Rocephin >> 5/10  SIGNIFICANT EVENTS: 4/25 Admitted with septic shock due to UTI. Significant ARF. 4/30 transferred out of ICU. 5/03 Cardiac arrest, anemia >> coil embolization Rt kidney by IR; PTX post-CPR 5/16 Reintubated due to airway secretions 5/18 start heparin gtt  LINES/TUBES: 4/26 Rt IJ HD cath >. 5/12 5/03 ETT >> 5/13 5/03 Lt femoral CVL >> 5/17 5/03 Lt chest tube >> 5/14 5/03 Rt chest tube >> 5/14 5/12 Rt IJ tunneled HD cath >>  5/16 ETT >>  5/17 Lt [place] CVL >>   DISCUSSION: 75 yo male with hemorrhagic shock leading to cardiac arrest, VDRF, AKI from Rt perinephric hematoma.  He has hx of Lt renal cell carcinoma s/p Lt nephrectomy, prostate cancer s/p XRT, PE in 2008, OSA, HTN, HLD, DM, A fib.  ASSESSMENT / PLAN:  PULMONARY A: Acute hypoxic respiratory failure in setting of cardiac arrest. Pneumothorax after CPR 5/03 >> chest tubes removed 5/14. Hx of OSA. Re-intubated 5/16 due to respiratory secretions. P:  Pressure support wean as tolerated >> will need trach to assist with vent weaning F/u CXR   CARDIOVASCULAR A: Hemorrhagic shock 2nd to Rt perinephric hematoma. Hx of A fib, HTN, HLD. Acute DVT. ?if cuff pressure readings are accurate. Ischemic changes on Lt foot >> stable. P:  Pressors to keep MAP > 65 Hold outpt coreg, tricor, lasix, niacin, accupril Continue heparin gtt >> consider resuming coumadin if Hb remains stable Increased midodrine  5/19  RENAL A:  AKI with hx of Stage III CKD. S/p Lt nephrectomy for RCC. P:  CRRT per nephrology >> goal even fluid balance  GASTROINTESTINAL A:  Nutrition. Dysphagia. P:  Continue tube feeds Protonix for SUP  HEMATOLOGIC / ONCOLOGIC A: Acute blood loss anemia 2nd to Rt perinephric hematoma. Anemia of critical illness and chronic disease. P:  F/u CBC SCDs  INFECTIOUS A:  Septic shock with Klebsiella bacteremia >> completed Abx 5/10. P:  Monitor fever curve  ENDOCRINE A:  DM. P:  Continue SSI  NEUROLOGIC A:  Acute encephalopathy 2nd to sepsis, shock, respiratory failure, renal failure >> improved. Deconditioning. P:  RASS goal 0 to -1  Updated pt's wife at bedside.  Pt and wife are agreeable to tracheostomy.  CC time 31 minutes.  Chesley Mires, MD [facility] Pulmonary/Critical Care 06/26/2015, 10:50 AM Pager:  [phone] After 3pm call: [phone]

## 2015-06-26 NOTE — Procedures (Signed)
Admit: 05/31/2015 LOS: 50  53M solitary kidney with AKI on CRRT related to perinephric hemorrhage with shock.  Current CRRT Prescription: Start Date: on/off, last start 5/3? Catheter: R IJ TUnneled catheter BFR: 150-200/h Pre Blood Pump: 300 4K DFR: 2000 4K Replacement Rate: 400 4K Goal UF: 150-218m net neg/hr Anticoagulation: start fixed dose heparin 5/19 Clotting: ~q10-12h   S: No new events Remains on NE Net even yesterday  O: 05/20 0701 - 05/21 0700 In: 2991.5 [I.V.:1436.5; NG/GT:1555] Out: 2961   Filed Weights   06/24/15 0300 06/25/15 0300 06/26/15 0400  Weight: 113.9 kg (251 lb 1.7 oz) 108.9 kg (240 lb 1.3 oz) 109.2 kg (240 lb 11.9 oz)     Recent Labs Lab 06/25/15 0444 06/25/15 1529 06/26/15 0500  NA 137 136 136  K 5.0 5.0 5.0  CL 99* 100* 97*  CO2 _0 GLUCOSE 232* 249* 299*  BUN 37* 38* 38*  CREATININE 1.37* 1.32* 1.39*  CALCIUM 9.0 8.9 8.9  PHOS 2.6 2.3* 2.7    Recent Labs Lab 06/24/15 0443 06/25/15 0444 06/26/15 0500  WBC 14.8* 23.0* 22.5*  HGB 8.2* 8.4* 8.2*  HCT 27.0* 28.1* 27.7*  MCV 91.8 92.1 93.6  PLT 183 280 274    Scheduled Meds: . antiseptic oral rinse  7 mL Mouth Rinse QID  . chlorhexidine gluconate (SAGE KIT)  15 mL Mouth Rinse BID  . sennosides  5 mL Oral BID   And  . docusate  100 mg Oral BID  . feeding supplement (PRO-STAT SUGAR FREE 64)  30 mL Per Tube TID  . insulin aspart  0-15 Units Subcutaneous Q4H  . midodrine  10 mg Oral TID AC  . pantoprazole sodium  40 mg Per Tube Q1200  . sodium chloride flush  10-40 mL Intracatheter Q12H   Continuous Infusions: . sodium chloride 10 mL/hr at 06/26/15 0500  . feeding supplement (VITAL HIGH PROTEIN) 1,000 mL (06/26/15 0400)  . fentaNYL infusion INTRAVENOUS Stopped (06/26/15 0731)  . heparin 10,000 units/ 20 mL infusion syringe 500 Units/hr (06/26/15 0010)  . heparin 1,150 Units/hr (06/26/15 0500)  . norepinephrine (LEVOPHED) Adult infusion 31 mcg/min (06/26/15 0500)  .  dialysis replacement fluid (prismasate) 1,000 mL/hr at 06/26/15 0136  . dialysis replacement fluid (prismasate) 400 mL/hr at 06/26/15 0136  . dialysate (PRISMASATE) 2,000 mL/hr at 06/26/15 0400   PRN Meds:.Place/Maintain arterial line **AND** sodium chloride, acetaminophen (TYLENOL) oral liquid 160 mg/5 mL, albuterol, fentaNYL, guaiFENesin, heparin, lidocaine (PF), lidocaine-prilocaine, midazolam, ondansetron, pentafluoroprop-tetrafluoroeth, sodium chloride flush  ABG    Component Value Date/Time   PHART 7.458* 06/21/2015 2158   PCO2ART 41.5 06/21/2015 2158   PO2ART 529.0* 06/21/2015 2158   HCO3 29.4* 06/21/2015 2158   TCO2 31 06/21/2015 2158   ACIDBASEDEF 3.9* 06/12/2015 0535   O2SAT 100.0 06/21/2015 2158    A 1. Anuric Dialysis dependent AKI with ATN, low likelihood of recovery 1. BL SCr 1.7-1.9  2. 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 Hemorrhagic Shock 2/2 ABLA 2/2 R perinephric hematoma s/p coil embolization 3. UTI with K Pneumoniae s/p ABX, completed 5/10 4. HCAP 5/16 back on ABX with VDRF 5. Hx/o L nephrectomy for RCC 6. AFib 7. 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 Hypervolema / Anasarca -- tolerating UF 9. Hypophosphatemia 2/2 CRRT 10. HTN 11. Anemia 12.  DM2   P 1. Cont CRRT, net even  2. Wouldn't push pressors to inc UF 3. Cont 500 IU/hr heparin to CRRT circuit,  no titration  Pearson Grippe, MD [employer] pgr [phone]

## 2015-06-26 NOTE — Progress Notes (Signed)
ANTICOAGULATION CONSULT NOTE - Follow Up Consult  Pharmacy Consult for heparin Indication: DVT  Labs:  Recent Labs  06/23/15 0325  06/24/15 0443  06/24/15 1600 06/25/15 0444 06/25/15 1223 06/25/15 1529 06/26/15 0012  HGB 8.1*  --  8.2*  --   --  8.4*  --   --   --   HCT 26.3*  --  27.0*  --   --  28.1*  --   --   --   PLT 181  --  183  --   --  280  --   --   --   HEPARINUNFRC  --   < >  --   --   --  0.99* 0.66  --  0.36  CREATININE 1.26*  < >  --   < > 1.38* 1.37*  --  1.32*  --   < > = values in this interval not displayed.   Assessment/Plan:  75yo male therapeutic on heparin after rate changes. Will continue gtt at current rate and confirm stable with am labs.   Wynona Neat, PharmD, BCPS  06/26/2015,12:54 AM

## 2015-06-26 NOTE — Progress Notes (Signed)
ANTICOAGULATION CONSULT NOTE - Follow-up Consult  Pharmacy Consult for heparin Indication: DVT  No Known Allergies  Patient Measurements: Height: 5\' 9"  (175.3 cm) Weight: 240 lb 11.9 oz (109.2 kg) IBW/kg (Calculated) : 70.7 Heparin Dosing Weight: 97.5 kg  Vital Signs: Temp: 97.3 F (36.3 C) (05/21 0732) Temp Source: Oral (05/21 0732) BP: 100/70 mmHg (05/21 0900) Pulse Rate: 74 (05/21 0900)  Labs:  Recent Labs  06/24/15 0443  06/25/15 0444 06/25/15 1223 06/25/15 1529 06/26/15 0012 06/26/15 0500  HGB 8.2*  --  8.4*  --   --   --  8.2*  HCT 27.0*  --  28.1*  --   --   --  27.7*  PLT 183  --  280  --   --   --  274  HEPARINUNFRC  --   --  0.99* 0.66  --  0.36 0.32  CREATININE  --   < > 1.37*  --  1.32*  --  1.39*  < > = values in this interval not displayed.  Estimated Creatinine Clearance: 57.6 mL/min (by C-G formula based on Cr of 1.39).  Medications: Heparin @ 1150 units/hr   Assessment: 75 yo male with acute DVT in gastrocnemius vein. Pt is on warfarin PTA for hx of DVT / PE.  PTA warfarin dose: 6 mg q Mon/Wed/Fri, 5 mg all other days. Pt has had prior complications with anticoagulation. He reportedly has chronic hematuria.  He also has a perinephric hematoma, this increased in size and caused the pt to go into hemorrhagic shock in the beginning of the month.  IR successfully emoblized the kidney area on 5/3. All anticoagulation had been held until 5/18 when heparin resumed for acute DVT. Noted pt also receiving heparin with CRRT.  Today's heparin level is therapeutic at 0.32. Hgb low but stable.  Goal of Therapy:  Heparin level 0.3-0.5 units/mL  Monitor platelets by anticoagulation protocol: Yes    Plan:  1) Increase heparin to 1200 units/hr to maintain therapeutic 2) Daily heparin level, CBC  Nena Jordan, PharmD, BCPS 06/26/2015, 9:47 AM

## 2015-06-27 ENCOUNTER — Inpatient Hospital Stay (HOSPITAL_COMMUNITY): Payer: BLUE CROSS/BLUE SHIELD

## 2015-06-27 ENCOUNTER — Telehealth: Payer: Self-pay | Admitting: *Deleted

## 2015-06-27 LAB — CBC
HEMATOCRIT: 27 % — AB (ref 39.0–52.0)
Hemoglobin: 7.9 g/dL — ABNORMAL LOW (ref 13.0–17.0)
MCH: 28.3 pg (ref 26.0–34.0)
MCHC: 29.3 g/dL — ABNORMAL LOW (ref 30.0–36.0)
MCV: 96.8 fL (ref 78.0–100.0)
PLATELETS: 201 10*3/uL (ref 150–400)
RBC: 2.79 MIL/uL — AB (ref 4.22–5.81)
RDW: 20.4 % — AB (ref 11.5–15.5)
WBC: 19.4 10*3/uL — AB (ref 4.0–10.5)

## 2015-06-27 LAB — RENAL FUNCTION PANEL
ALBUMIN: 2.3 g/dL — AB (ref 3.5–5.0)
ANION GAP: 8 (ref 5–15)
Albumin: 2.5 g/dL — ABNORMAL LOW (ref 3.5–5.0)
Anion gap: 8 (ref 5–15)
BUN: 36 mg/dL — ABNORMAL HIGH (ref 6–20)
BUN: 31 mg/dL — AB (ref 6–20)
CHLORIDE: 101 mmol/L (ref 101–111)
CHLORIDE: 102 mmol/L (ref 101–111)
CO2: 27 mmol/L (ref 22–32)
CO2: 26 mmol/L (ref 22–32)
Calcium: 8.8 mg/dL — ABNORMAL LOW (ref 8.9–10.3)
Calcium: 8.8 mg/dL — ABNORMAL LOW (ref 8.9–10.3)
Creatinine, Ser: 1.26 mg/dL — ABNORMAL HIGH (ref 0.61–1.24)
Creatinine, Ser: 1.19 mg/dL (ref 0.61–1.24)
GFR calc Af Amer: >60 mL/min (ref >60)
GFR, EST NON AFRICAN AMERICAN: 55 mL/min — AB (ref >60)
GFR, EST NON AFRICAN AMERICAN: 59 mL/min — AB (ref >60)
GLUCOSE: 182 mg/dL — AB (ref 65–99)
Glucose, Bld: 237 mg/dL — ABNORMAL HIGH (ref 65–99)
PHOSPHORUS: 2.3 mg/dL — AB (ref 2.5–4.6)
POTASSIUM: 4.7 mmol/L (ref 3.5–5.1)
POTASSIUM: 4.6 mmol/L (ref 3.5–5.1)
Phosphorus: 2 mg/dL — ABNORMAL LOW (ref 2.5–4.6)
SODIUM: 136 mmol/L (ref 135–145)
Sodium: 136 mmol/L (ref 135–145)

## 2015-06-27 LAB — GLUCOSE, CAPILLARY
GLUCOSE-CAPILLARY: 197 mg/dL — AB (ref 65–99)
GLUCOSE-CAPILLARY: 220 mg/dL — AB (ref 65–99)
GLUCOSE-CAPILLARY: 175 mg/dL — AB (ref 65–99)
GLUCOSE-CAPILLARY: 232 mg/dL — AB (ref 65–99)
Glucose-Capillary: 203 mg/dL — ABNORMAL HIGH (ref 65–99)
Glucose-Capillary: 230 mg/dL — ABNORMAL HIGH (ref 65–99)

## 2015-06-27 LAB — HEPARIN LEVEL (UNFRACTIONATED): HEPARIN UNFRACTIONATED: 0.53 [IU]/mL (ref 0.30–0.70)

## 2015-06-27 LAB — MAGNESIUM: Magnesium: 2.5 mg/dL — ABNORMAL HIGH (ref 1.7–2.4)

## 2015-06-27 MED ORDER — INSULIN NPH (HUMAN) (ISOPHANE) 100 UNIT/ML ~~LOC~~ SUSP
10.0000 [IU] | Freq: Two times a day (BID) | SUBCUTANEOUS | Status: DC
  Administered 2015-06-27 – 2015-06-28 (×2): 10 [IU] via SUBCUTANEOUS
  Filled 2015-06-27: qty 10

## 2015-06-27 NOTE — Progress Notes (Addendum)
PULMONARY / CRITICAL CARE MEDICINE   Name: [name] MRN: [id number] DOB: [birth year]    ADMISSION DATE:  05/31/2015  REFERRING MD:  EDP  CHIEF COMPLAINT:  Hypotension   SUBJECTIVE: No acute events overnight. Patient having periods of bradycardia secondary to heart rhythm changes from atrial fibrillation to atrial flutter. Patient nods no to any pain or difficulty breathing.  REVIEW OF SYSTEMS: Unobtainable as the patient is currently intubated.  VITAL SIGNS: BP 63/54 mmHg  Pulse 76  Temp(Src) 98.1 F (36.7 C) (Oral)  Resp 17  Ht 5\' 9"  (1.753 m)  Wt 240 lb 1.3 oz (108.9 kg)  BMI 35.44 kg/m2  SpO2 100%  INTAKE / OUTPUT: I/O last 3 completed shifts: In: 4648.9 [I.V.:2093.9; NG/GT:2555] Out: [id number]  PHYSICAL EXAMINATION: General:  Awake. No acute distress. Wife at bedside.  Integument:  Warm & dry. No rash on exposed skin.  HEENT:  No scleral injection or icterus. Endotracheal tube in place. PERRL. Cardiovascular:  Regular rate with irregular rhythm in atrial fibrillation. No edema. No appreciable JVD.  Pulmonary:  Clear bilaterally to auscultation. Symmetric chest rise on ventilator. Abdomen: Soft. Normal bowel sounds. Nondistended.  Neurological:  Moving all 4 extremities equally. Following commands. Attends to voice.  LABS:  BMET  Recent Labs Lab 06/26/15 0500 06/26/15 1642 06/27/15 0500  NA 136 136 136  K 5.0 4.9 4.7  CL 97* 101 101  CO2 26 26 27   BUN 38* 36* 36*  CREATININE 1.39* 1.32* 1.26*  GLUCOSE 299* 261* 237*    Electrolytes  Recent Labs Lab 06/25/15 0444  06/26/15 0500 06/26/15 1642 06/27/15 0500  CALCIUM 9.0  < > 8.9 8.9 8.8*  MG 2.4  --  2.5*  --  2.5*  PHOS 2.6  < > 2.7 2.7 2.3*  < > = values in this interval not displayed.  CBC  Recent Labs Lab 06/25/15 0444 06/26/15 0500 06/27/15 0500  WBC 23.0* 22.5* 19.4*  HGB 8.4* 8.2* 7.9*  HCT 28.1* 27.7* 27.0*  PLT 280 274 201    Coag's No results for input(s):  APTT, INR in the last 168 hours.  Sepsis Markers No results for input(s): LATICACIDVEN, PROCALCITON, O2SATVEN in the last 168 hours.  ABG  Recent Labs Lab 06/21/15 1947 06/21/15 2158  PHART 7.474* 7.458*  PCO2ART 34.7* 41.5  PO2ART 81.0 529.0*    Liver Enzymes  Recent Labs Lab 06/26/15 0500 06/26/15 1642 06/27/15 0500  ALBUMIN 2.4* 2.4* 2.3*    Cardiac Enzymes No results for input(s): TROPONINI, PROBNP in the last 168 hours.  Glucose  Recent Labs Lab 06/26/15 0737 06/26/15 1157 06/26/15 1547 06/26/15 2007 06/27/15 0038 06/27/15 0415  GLUCAP 228* 227* 263* 230* 203* 230*    Imaging No results found.   STUDIES:  4/26 Renal u/s >> s/p Lt nephrectomy 4/27 CT abd/pelvis >> Rt perinephric hematoma 5/02 CT abd/pelvis >> increased size of hematoma 5/08 Echo >> EF 65 to 70% 5/18 Doppler legs b/l >> Acute DVT Rt gastrocnemius vein, age indeterminate DVT Rt femoral vein and popliteal vein 5/22 Port CXR: Support lines and tubes remain in position. Low lung volumes. Partial silhouetting of left hemidiaphragm without obvious opacity.  CULTURES: 4/25 Blood >> Klebsiella pneumoniae 4/27 Blood >> negative 5/16 BAL >> oral flora  ANTIBIOTICS: 4/28 Rocephin >> 5/10  SIGNIFICANT EVENTS: 4/25 Admitted with septic shock due to UTI. Significant ARF. 4/30 transferred out of ICU. 5/03 Cardiac arrest, anemia >> coil embolization Rt kidney by IR; PTX post-CPR 5/16 Reintubated  due to airway secretions 5/18 start heparin gtt  LINES/TUBES: Rt IJ HD cath 4/26 - 5/12 ETT 5/3 - 5/13 Lt femoral CVL 5/3 - 5/17 Lt chest tube 5/3 - 5/14 Rt chest tube 5/3 - 5/14 Rt IJ tunneled HD cath 5/12  >>  ETT 7.5 5/16  >>  Lt [place] CVL 5/17 >>  NGT 5/8 >> PIV x1  ASSESSMENT / PLAN:  PULMONARY A: Acute Hypoxic Respiratory Failure - Post Cardiac Arrest. Pneumothorax after CPR 5/03 - chest tubes removed 5/14. H/O OSA. Re-intubated 5/16 due to respiratory secretions.  P:   Continuing Pressure Support Wean Plan for tracheostomy as discussed with family by Dr. Halford Chessman.  CARDIOVASCULAR A: S/P Cardiac Arrest Shock - Hemorrhagic secondary to Rt perinephric hematoma. Questionable cuff pressure readings. Right Gastocnemious vein acute DVT Intermediate age DVT Right femoral & popliteal veins Ischemic [place] Left Foot  P:  Levophed to keep MAP > 65 Hold outpt coreg, tricor, lasix, niacin, accupril Continue heparin gtt >> consider resuming coumadin if Hb remains stable Midodrine 10mg  tid  RENAL A:  Acute on Chronic Renal Failure  H/O Left Nephrectomy for RCC  P:  Nephrology following Continuing CRRT Monitoring electrolytes per nephrology  GASTROINTESTINAL A:  H/O Dysphagia  P:  Continuing Tube Feedings Protonix VT qday Senna & Colace bid  HEMATOLOGIC / ONCOLOGIC A: Anemia - Secondary to acute blood loss from Rt perinephric hematoma. Hgb Stable. Leukocytosis - Unclear etiology. Improving. H/O Left RCC - S/P nephrectomy remotely. H/O Anemia  P:  Trending Cell Counts daily w/ CBC Heparin gtt SCDs  INFECTIOUS A:  Sepsis Klebsiella Bacteremia - Completed Antibiotic course 5/10.  P:  No antibiotics currently Trending fever curve & leukocytosis  ENDOCRINE A:  DM - BG elevated.  P:  AccuChecks q4hr Moderate Dose SSI per algorithm  Starting NPH 10u [place] q12hr tonight  NEUROLOGIC A:  Acute Encephalopathy - Possibly secondary to metabolic encephalopathy & shock. Physical Deconditioning  P:  RASS goal: 0 to -1 Fentanyl gtt & IV prn Versed IV prn  TODAY'S SUMMARY:  [name] w/ h/o Lt renal cell carcinoma s/p Lt nephrectomy, prostate cancer s/p XRT, PE in 2008, OSA, HTN, HLD, DM, A fib. Now with hemorrhagic shock leading to cardiac arrest, VDRF, AKI from Rt perinephric hematoma. Plan for tracheostomy this week and continue ventilator wean.  I have spent a total of 32 minutes of critical care time today caring  for the patient, updating his wife at bedside, & reviewing his electronic medical record.  Sonia Baller Ashok Cordia, M.D. [facility] Pulmonary & Critical Care Pager:  [phone] After 3pm or if no response, call [phone]  06/27/2015, 6:52 AM

## 2015-06-27 NOTE — Progress Notes (Signed)
ANTICOAGULATION CONSULT NOTE - Follow-up Consult  Pharmacy Consult for heparin Indication: DVT  No Known Allergies  Patient Measurements: Height: 5\' 9"  (175.3 cm) Weight: 240 lb 1.3 oz (108.9 kg) IBW/kg (Calculated) : 70.7 Heparin Dosing Weight: 97.5 kg  Vital Signs: Temp: 98.5 F (36.9 C) (05/22 1114) Temp Source: Oral (05/22 1114) BP: 99/66 mmHg (05/22 1300) Pulse Rate: 92 (05/22 1300)  Labs:  Recent Labs  06/25/15 0444  06/26/15 0012 06/26/15 0500 06/26/15 1642 06/27/15 0500  HGB 8.4*  --   --  8.2*  --  7.9*  HCT 28.1*  --   --  27.7*  --  27.0*  PLT 280  --   --  274  --  201  HEPARINUNFRC 0.99*  < > 0.36 0.32  --  0.53  CREATININE 1.37*  < >  --  1.39* 1.32* 1.26*  < > = values in this interval not displayed.  Estimated Creatinine Clearance: 63.5 mL/min (by C-G formula based on Cr of 1.26).  Medications: Heparin @ 1150 units/hr   Assessment: 75 y.o. male admitted on 05/31/2015 with sepsis. Transferred to ICU s/p cardiac arrest with hemorrhagic and septic shock on 5/3  PMH: prostate ca s/p radiation 2011, renal cell ca s/p left nephrectomy, afib/hx of dvt on warfarin  Anticoagulation: Coumadin PTA for AFib + hx DVT/PE.  5/10 HIT panel neg, SRA neg 5/11 Dopplers: + acute DVT in R leg On Heparin for acute DVT at 1200 units/hr with therapeutic level of 0.53  Nephrology: One kidney, due to renal embolization + previous nephrectomy. On CRRT since 5/3. Minimal UOP >> unlikely renal recovery per Nephro. Tunneled cath placed 5/12, but have not started HD  Hematology / Oncology: H&H 7.9/27, Plt 201  Goal of Therapy:  Heparin level 0.3-0.5 units/mL  Monitor platelets by anticoagulation protocol: Yes    Plan:  Continue heparin at 1200 units/hr Daily heparin level, CBC  Levester Fresh, PharmD, BCPS, [facility] Clinical Pharmacist Pager [phone] 06/27/2015 1:05 PM

## 2015-06-27 NOTE — Progress Notes (Signed)
Patient ID: DUAYNE BRIDEAU, male   DOB: 03/29/1940, 75 y.o.   MRN: [id number]  [facility] Progress Note   Assessment/ Plan:   1. AKI: Anuric, dependent on dialysis and with low/limited likelihood of recovery. With prior history of right solitary kidney (status post left nephrectomy for RCC) that required to be partially coiled secondary to perinephric hematoma. Continue current CRRT prescription for regulation of metabolic abnormality/clearance. Keeping him even at this time with pressor dependent hypotension 2. Healthcare associated pneumonia: With VDRF, on broad-spectrum antibiotic therapy. 3. Atrial fibrillation: Paroxysmal and currently appears to be in sinus rhythm 4. Anemia with recent hemorrhagic shock/acute blood loss anemia from right perinephric hematoma 5. Hypophosphatemia: Secondary to CRRT losses-replaced via intravenous route  Subjective:   No acute events overnight    Objective:   BP 83/55 mmHg  Pulse 73  Temp(Src) 98.1 F (36.7 C) (Oral)  Resp 15  Ht '5\' 9"'  (1.753 m)  Wt 108.9 kg (240 lb 1.3 oz)  BMI 35.44 kg/m2  SpO2 100%  Intake/Output Summary (Last 24 hours) at 06/27/15 0747 Last data filed at 06/27/15 0700  Gross per 24 hour  Intake 3067.3 ml  Output   3085 ml  Net  -17.7 ml   Weight change: -0.3 kg (-10.6 oz)  Physical Exam: Gen: Appears to be comfortable on ventilator/CRRT-opens eyes and nods to conversation CVS: Pulse regular rhythm, S1 and S2 normal Resp: Coarse breath sounds bilaterally, no rales Abd: Soft, obese, nontender Ext: Trace to 1+ lower extremity edema  Imaging: Dg Chest Port 1 View  06/27/2015  CLINICAL DATA:  Respiratory failure.  Endotracheal tube placement EXAM: PORTABLE CHEST 1 VIEW COMPARISON:  06/25/2015 FINDINGS: Endotracheal tube in good position. Feeding tube enters the stomach with the tip not visualized. Left subclavian central venous catheter tip in the SVC. Right jugular central venous catheter tip in the SVC.  These are unchanged. No pneumothorax. Mild left lower lobe airspace disease is unchanged. Negative for heart failure or pleural effusion. IMPRESSION: Support lines remain in good position and unchanged Left lower lobe atelectasis/infiltrate unchanged. Electronically Signed   By: Franchot Gallo M.D.   On: 06/27/2015 07:10    Labs: BMET  Recent Labs Lab 06/24/15 0500 06/24/15 1600 06/25/15 0444 06/25/15 1529 06/26/15 0500 06/26/15 1642 06/27/15 0500  NA 137 138 137 136 136 136 136  K 4.8 5.2* 5.0 5.0 5.0 4.9 4.7  CL 99* 101 99* 100* 97* 101 101  CO2 '26 24 26 26 26 26 27  ' GLUCOSE 238* 271* 232* 249* 299* 261* 237*  BUN 36* 37* 37* 38* 38* 36* 36*  CREATININE 1.32* 1.38* 1.37* 1.32* 1.39* 1.32* 1.26*  CALCIUM 8.8* 9.1 9.0 8.9 8.9 8.9 8.8*  PHOS 3.1 3.0 2.6 2.3* 2.7 2.7 2.3*   CBC  Recent Labs Lab 06/24/15 0443 06/25/15 0444 06/26/15 0500 06/27/15 0500  WBC 14.8* 23.0* 22.5* 19.4*  HGB 8.2* 8.4* 8.2* 7.9*  HCT 27.0* 28.1* 27.7* 27.0*  MCV 91.8 92.1 93.6 96.8  PLT 183 280 274 201    Medications:    . antiseptic oral rinse  7 mL Mouth Rinse QID  . chlorhexidine gluconate (SAGE KIT)  15 mL Mouth Rinse BID  . sennosides  5 mL Oral BID   And  . docusate  100 mg Oral BID  . feeding supplement (PRO-STAT SUGAR FREE 64)  30 mL Per Tube TID  . insulin aspart  0-15 Units Subcutaneous Q4H  . midodrine  10 mg Oral TID AC  .  pantoprazole sodium  40 mg Per Tube Q1200  . sodium chloride flush  10-40 mL Intracatheter Q12H   Elmarie Shiley, MD 06/27/2015, 7:47 AM

## 2015-06-27 NOTE — Telephone Encounter (Signed)
[id number] of patient/ patient is improved.  Still on the vent but is now awake and does follow commands.  Remains on crrt and wife is aware that future outcomes could change.  Encouraged support and prayer offered to wife.  Has a good family support for short and long term outcomes./Renly Roots,BSN,RN3,CCM,CN.

## 2015-06-27 NOTE — Progress Notes (Signed)
CSW consult acknowledged. CSW following for disposition and CIR SNF backup placement when Patient is medically stable.     Emiliano Dyer, LCSW [facility] ED/77M Clinical Social Worker [phone]

## 2015-06-28 ENCOUNTER — Inpatient Hospital Stay (HOSPITAL_COMMUNITY): Payer: BLUE CROSS/BLUE SHIELD

## 2015-06-28 DIAGNOSIS — I2601 Septic pulmonary embolism with acute cor pulmonale: Secondary | ICD-10-CM

## 2015-06-28 DIAGNOSIS — R578 Other shock: Secondary | ICD-10-CM

## 2015-06-28 LAB — RENAL FUNCTION PANEL
ALBUMIN: 2.4 g/dL — AB (ref 3.5–5.0)
ALBUMIN: 2.4 g/dL — AB (ref 3.5–5.0)
ANION GAP: 9 (ref 5–15)
Anion gap: 8 (ref 5–15)
BUN: 36 mg/dL — ABNORMAL HIGH (ref 6–20)
BUN: 33 mg/dL — AB (ref 6–20)
CALCIUM: 8.8 mg/dL — AB (ref 8.9–10.3)
CHLORIDE: 102 mmol/L (ref 101–111)
CO2: 26 mmol/L (ref 22–32)
CO2: 27 mmol/L (ref 22–32)
CREATININE: 1.16 mg/dL (ref 0.61–1.24)
Calcium: 8.7 mg/dL — ABNORMAL LOW (ref 8.9–10.3)
Chloride: 101 mmol/L (ref 101–111)
Creatinine, Ser: 1.22 mg/dL (ref 0.61–1.24)
GFR calc non Af Amer: 57 mL/min — ABNORMAL LOW (ref >60)
GLUCOSE: 230 mg/dL — AB (ref 65–99)
Glucose, Bld: 223 mg/dL — ABNORMAL HIGH (ref 65–99)
PHOSPHORUS: 2.4 mg/dL — AB (ref 2.5–4.6)
PHOSPHORUS: 2.2 mg/dL — AB (ref 2.5–4.6)
POTASSIUM: 4.7 mmol/L (ref 3.5–5.1)
Potassium: 4.7 mmol/L (ref 3.5–5.1)
SODIUM: 136 mmol/L (ref 135–145)
Sodium: 137 mmol/L (ref 135–145)

## 2015-06-28 LAB — CBC
HEMATOCRIT: 27.7 % — AB (ref 39.0–52.0)
HEMOGLOBIN: 7.9 g/dL — AB (ref 13.0–17.0)
MCH: 27.3 pg (ref 26.0–34.0)
MCHC: 28.5 g/dL — AB (ref 30.0–36.0)
MCV: 95.8 fL (ref 78.0–100.0)
Platelets: 203 10*3/uL (ref 150–400)
RBC: 2.89 MIL/uL — AB (ref 4.22–5.81)
RDW: 21.5 % — ABNORMAL HIGH (ref 11.5–15.5)
WBC: 15.5 10*3/uL — ABNORMAL HIGH (ref 4.0–10.5)

## 2015-06-28 LAB — ECHOCARDIOGRAM COMPLETE
HEIGHTINCHES: 69 in
WEIGHTICAEL: 3834.24 [oz_av]

## 2015-06-28 LAB — FERRITIN: Ferritin: 1421 ng/mL — ABNORMAL HIGH (ref 24–336)

## 2015-06-28 LAB — GLUCOSE, CAPILLARY
GLUCOSE-CAPILLARY: 189 mg/dL — AB (ref 65–99)
GLUCOSE-CAPILLARY: 193 mg/dL — AB (ref 65–99)
Glucose-Capillary: 207 mg/dL — ABNORMAL HIGH (ref 65–99)
Glucose-Capillary: 202 mg/dL — ABNORMAL HIGH (ref 65–99)
Glucose-Capillary: 192 mg/dL — ABNORMAL HIGH (ref 65–99)

## 2015-06-28 LAB — HEPARIN LEVEL (UNFRACTIONATED): Heparin Unfractionated: 0.65 IU/mL (ref 0.30–0.70)

## 2015-06-28 LAB — IRON AND TIBC
Iron: 46 ug/dL (ref 45–182)
Saturation Ratios: 17 % — ABNORMAL LOW (ref 17.9–39.5)
TIBC: 272 ug/dL (ref 250–450)
UIBC: 226 ug/dL

## 2015-06-28 LAB — MAGNESIUM: Magnesium: 2.5 mg/dL — ABNORMAL HIGH (ref 1.7–2.4)

## 2015-06-28 MED ORDER — INSULIN NPH (HUMAN) (ISOPHANE) 100 UNIT/ML ~~LOC~~ SUSP
15.0000 [IU] | Freq: Two times a day (BID) | SUBCUTANEOUS | Status: DC
  Administered 2015-06-28: 15 [IU] via SUBCUTANEOUS
  Filled 2015-06-28: qty 10

## 2015-06-28 NOTE — Progress Notes (Signed)
Patient ID: [name], male   DOB: [birth year], 75 y.o.   MRN: [id number]  [facility] Progress Note   Assessment/ Plan:   1. AKI on CKD3: Anuric, dependent on dialysis and with high likelihood of progression to dialysis dependent ESRD. With prior history of right solitary kidney (status post left nephrectomy for RCC) that required to be partially coiled secondary to perinephric hematoma. Continue current CRRT prescription for regulation of metabolic abnormality/clearance. He appears to be close to euvolemic on physical exam and remains on pressors for hypotension-no ultrafiltration with CRRT (keeping even) 2. Healthcare associated pneumonia: With VDRF, on broad-spectrum antibiotic therapy. 3. Atrial fibrillation: Paroxysmal and currently appears to be in sinus rhythm 4. Anemia with recent hemorrhagic shock/acute blood loss anemia from right perinephric hematoma. Check iron studies 5. Hypophosphatemia: Secondary to CRRT losses-improving status post replacement via intravenous route  Subjective:   No acute events overnight -The patient nods no when questioned if he is uncomfortable.    Objective:   BP 69/43 mmHg  Pulse 98  Temp(Src) 98.4 F (36.9 C) (Oral)  Resp 19  Ht _0  (1.753 m)  Wt 108.7 kg (239 lb 10.2 oz)  BMI 35.37 kg/m2  SpO2 100%  Intake/Output Summary (Last 24 hours) at 06/28/15 3335 Last data filed at 06/28/15 0800  Gross per 24 hour  Intake 2702.97 ml  Output   2748 ml  Net -45.03 ml   Weight change: -0.2 kg (-7.1 oz)  Physical Exam: Gen: Appears to be comfortable on ventilator/CRRT-opens eyes and nods to conversation CVS: Pulse regular rhythm, S1 and S2 normal Resp: Coarse breath sounds bilaterally, no rales Abd: Soft, obese, nontender Ext: Trace to 1+ lower extremity edema  Imaging: Dg Chest Port 1 View  06/27/2015  CLINICAL DATA:  Respiratory failure.  Endotracheal tube placement EXAM: PORTABLE CHEST 1 VIEW COMPARISON:  06/25/2015  FINDINGS: Endotracheal tube in good position. Feeding tube enters the stomach with the tip not visualized. Left subclavian central venous catheter tip in the SVC. Right jugular central venous catheter tip in the SVC. These are unchanged. No pneumothorax. Mild left lower lobe airspace disease is unchanged. Negative for heart failure or pleural effusion. IMPRESSION: Support lines remain in good position and unchanged Left lower lobe atelectasis/infiltrate unchanged. Electronically Signed   By: Franchot Gallo M.D.   On: 06/27/2015 07:10    Labs: BMET  Recent Labs Lab 06/25/15 0444 06/25/15 1529 06/26/15 0500 06/26/15 1642 06/27/15 0500 06/27/15 1600 06/28/15 0445  NA 137 136 136 136 136 136 136  K 5.0 5.0 5.0 4.9 4.7 4.6 4.7  CL 99* 100* 97* 101 101 102 101  CO2 _1 GLUCOSE 232* 249* 299* 261* 237* 182* 230*  BUN 37* 38* 38* 36* 36* 31* 36*  CREATININE 1.37* 1.32* 1.39* 1.32* 1.26* 1.19 1.22  CALCIUM 9.0 8.9 8.9 8.9 8.8* 8.8* 8.8*  PHOS 2.6 2.3* 2.7 2.7 2.3* 2.0* 2.4*   CBC  Recent Labs Lab 06/25/15 0444 06/26/15 0500 06/27/15 0500 06/28/15 0445  WBC 23.0* 22.5* 19.4* 15.5*  HGB 8.4* 8.2* 7.9* 7.9*  HCT 28.1* 27.7* 27.0* 27.7*  MCV 92.1 93.6 96.8 95.8  PLT 280 274 201 203    Medications:    . antiseptic oral rinse  7 mL Mouth Rinse QID  . chlorhexidine gluconate (SAGE KIT)  15 mL Mouth Rinse BID  . sennosides  5 mL Oral BID   And  . docusate  100 mg Oral BID  .  feeding supplement (PRO-STAT SUGAR FREE 64)  30 mL Per Tube TID  . insulin aspart  0-15 Units Subcutaneous Q4H  . insulin NPH Human  10 Units Subcutaneous BID AC & HS  . midodrine  10 mg Oral TID AC  . pantoprazole sodium  40 mg Per Tube Q1200  . sodium chloride flush  10-40 mL Intracatheter Q12H   Elmarie Shiley, MD 06/28/2015, 8:07 AM

## 2015-06-28 NOTE — Progress Notes (Signed)
ANTICOAGULATION CONSULT NOTE - Follow-up Consult  Pharmacy Consult for heparin Indication: DVT  No Known Allergies  Patient Measurements: Height: 5\' 9"  (175.3 cm) Weight: 239 lb 10.2 oz (108.7 kg) IBW/kg (Calculated) : 70.7 Heparin Dosing Weight: 97.5 kg  Vital Signs: Temp: 98.5 F (36.9 C) (05/23 0819) Temp Source: Oral (05/23 0819) BP: 69/43 mmHg (05/23 0800) Pulse Rate: 98 (05/23 0806)  Labs:  Recent Labs  06/26/15 0500  06/27/15 0500 06/27/15 1600 06/28/15 0445  HGB 8.2*  --  7.9*  --  7.9*  HCT 27.7*  --  27.0*  --  27.7*  PLT 274  --  201  --  203  HEPARINUNFRC 0.32  --  0.53  --  0.65  CREATININE 1.39*  < > 1.26* 1.19 1.22  < > = values in this interval not displayed.  Estimated Creatinine Clearance: 65.5 mL/min (by C-G formula based on Cr of 1.22).  Medications: Heparin @ 1150 units/hr   Assessment: 75 y.o. male admitted on 05/31/2015 with sepsis. Transferred to ICU s/p cardiac arrest with hemorrhagic and septic shock on 5/3  PMH: prostate ca s/p radiation 2011, renal cell ca s/p left nephrectomy, afib/hx of dvt on warfarin  Anticoagulation: Coumadin PTA for AFib + hx DVT/PE.  5/10 HIT panel neg, SRA neg 5/11 Dopplers: + acute DVT in R leg On Heparin for acute DVT at 1200 units/hr with therapeutic level of 0.65  Nephrology: One kidney, due to renal embolization + previous nephrectomy. On CRRT since 5/3. Minimal UOP >> unlikely renal recovery per Nephro. Tunneled cath placed 5/12, but have not started HD  Hematology / Oncology: H&H 7.9/27, Plt 201  Goal of Therapy:  Heparin level 0.3-0.7 units/mL  Monitor platelets by anticoagulation protocol: Yes   Plan:  Continue heparin at 1200 units/hr Daily heparin level, CBC  Levester Fresh, PharmD, BCPS, [facility] Clinical Pharmacist Pager [phone] 06/28/2015 8:19 AM

## 2015-06-28 NOTE — Progress Notes (Signed)
*  PRELIMINARY RESULTS* Echocardiogram 2D Echocardiogram has been performed.  [name] 06/28/2015, 10:54 AM

## 2015-06-28 NOTE — Progress Notes (Signed)
Pt is back on FS at this time tolerating it well no distress or complications noted.

## 2015-06-28 NOTE — Progress Notes (Signed)
Inpatient Diabetes Program Recommendations  AACE/ADA: New Consensus Statement on Inpatient Glycemic Control (2015)  Target Ranges:  Prepandial:   less than 140 mg/dL      Peak postprandial:   less than 180 mg/dL (1-2 hours)      Critically ill patients:  140 - 180 mg/dL   Review of Glycemic Control  Results for [name], [name] (MRN [id number]) as of 06/28/2015 12:11  Ref. Range 06/27/2015 19:56 06/28/2015 00:12 06/28/2015 03:37 06/28/2015 11:49  Glucose-Capillary Latest Ref Range: 65-99 mg/dL 232 (H) 189 (H) 193 (H) 207 (H)    Inpatient Diabetes Recommendations:  Please add Novolog tube feed coverage 3 units q 4 hours to maintain good glycemic control.  Will follow.  Thank you. Lorenda Peck, RD, LDN, CDE Inpatient Diabetes Coordinator [phone]

## 2015-06-28 NOTE — Progress Notes (Signed)
PULMONARY / CRITICAL CARE MEDICINE   Name: [name] MRN: [id number] DOB: Feb 11, 1940    ADMISSION DATE:  05/31/2015  REFERRING MD:  EDP  CHIEF COMPLAINT:  Hypotension   SUBJECTIVE: No acute events overnight. Per nurse report patient is having more episodes of bradycardia when in atrial flutter versus atrial fibrillation. Slight increase in pressor requirement. Nods no to any pain or difficulty breathing.  REVIEW OF SYSTEMS: Unobtainable as the patient is currently intubated.  VITAL SIGNS: BP 69/43 mmHg  Pulse 98  Temp(Src) 98.5 F (36.9 C) (Oral)  Resp 19  Ht 5\' 9"  (1.753 m)  Wt 239 lb 10.2 oz (108.7 kg)  BMI 35.37 kg/m2  SpO2 100%  INTAKE / OUTPUT: I/O last 3 completed shifts: In: 4144.8 [I.V.:1459.8; NG/GT:2685] Out: 4223 [Other:4223]  PHYSICAL EXAMINATION: General:  Eyes closed. No acute distress. No one at bedside.  Integument:  Warm & dry. No rash on exposed skin. Mild ischemic changes left foot. HEENT:  No scleral injection. Endotracheal tube in place. PERRL. Cardiovascular:  Regular rate with irregular rhythm in atrial fibrillation. No edema. No appreciable JVD.  Pulmonary:  Clear bilaterally to auscultation. Symmetric chest rise on ventilator. Abdomen: Soft. Normal bowel sounds. Nondistended.  Neurological:  Moving all 4 extremities equally. Follows commands. Nods to questions.  LABS:  BMET  Recent Labs Lab 06/27/15 0500 06/27/15 1600 06/28/15 0445  NA 136 136 136  K 4.7 4.6 4.7  CL 101 102 101  CO2 27 26 26   BUN 36* 31* 36*  CREATININE 1.26* 1.19 1.22  GLUCOSE 237* 182* 230*    Electrolytes  Recent Labs Lab 06/26/15 0500  06/27/15 0500 06/27/15 1600 06/28/15 0445  CALCIUM 8.9  < > 8.8* 8.8* 8.8*  MG 2.5*  --  2.5*  --  2.5*  PHOS 2.7  < > 2.3* 2.0* 2.4*  < > = values in this interval not displayed.  CBC  Recent Labs Lab 06/26/15 0500 06/27/15 0500 06/28/15 0445  WBC 22.5* 19.4* 15.5*  HGB 8.2* 7.9* 7.9*  HCT 27.7* 27.0*  27.7*  PLT 274 201 203    Coag's No results for input(s): APTT, INR in the last 168 hours.  Sepsis Markers No results for input(s): LATICACIDVEN, PROCALCITON, O2SATVEN in the last 168 hours.  ABG  Recent Labs Lab 06/21/15 1947 06/21/15 2158  PHART 7.474* 7.458*  PCO2ART 34.7* 41.5  PO2ART 81.0 529.0*    Liver Enzymes  Recent Labs Lab 06/27/15 0500 06/27/15 1600 06/28/15 0445  ALBUMIN 2.3* 2.5* 2.4*    Cardiac Enzymes No results for input(s): TROPONINI, PROBNP in the last 168 hours.  Glucose  Recent Labs Lab 06/27/15 0808 06/27/15 1104 06/27/15 1511 06/27/15 1956 06/28/15 0012 06/28/15 0337  GLUCAP 197* 220* 175* 232* 189* 193*    Imaging No results found.   STUDIES:  4/26 Renal u/s >> s/p Lt nephrectomy 4/27 CT abd/pelvis >> Rt perinephric hematoma 5/02 CT abd/pelvis >> increased size of hematoma 5/08 Echo >> EF 65 to 70% 5/18 Doppler legs b/l >> Acute DVT Rt gastrocnemius vein, age indeterminate DVT Rt femoral vein and popliteal vein 5/22 Port CXR: Support lines and tubes remain in position. Low lung volumes. Partial silhouetting of left hemidiaphragm without obvious opacity.  CULTURES: 4/25 Blood >> Klebsiella pneumoniae 4/27 Blood >> negative 5/16 BAL >> oral flora  ANTIBIOTICS: 4/28 Rocephin >> 5/10  SIGNIFICANT EVENTS: 4/25 Admitted with septic shock due to UTI. Significant ARF. 4/30 transferred out of ICU. 5/03 Cardiac arrest, anemia >> coil  embolization Rt kidney by IR; PTX post-CPR 5/16 Reintubated due to airway secretions 5/18 start heparin gtt  LINES/TUBES: Rt IJ HD cath 4/26 - 5/12 ETT 5/3 - 5/13 Lt femoral CVL 5/3 - 5/17 Lt chest tube 5/3 - 5/14 Rt chest tube 5/3 - 5/14 Rt IJ tunneled HD cath 5/12  >>  ETT 7.5 5/16  >>  Lt [place] CVL 5/17 >>  NGT 5/8 >> PIV x1  ASSESSMENT / PLAN:  PULMONARY A: Acute Hypoxic Respiratory Failure - Post Cardiac Arrest. Pneumothorax after CPR 5/03 - chest tubes removed 5/14. H/O  OSA. Re-intubated 5/16 due to respiratory secretions.  P:  Continuing Pressure Support Wean Plan for tracheostomy this week  CARDIOVASCULAR A: S/P Cardiac Arrest Shock - Hemorrhagic secondary to Rt perinephric hematoma. Questionable cuff pressure readings. Right Gastocnemious vein acute DVT Intermediate age DVT Right femoral & popliteal veins Ischemic Changes Left Foot - Unchanged.  P:  Levophed to keep MAP > 65 Hold outpt coreg, tricor, lasix, niacin, accupril Continue heparin gtt  Holding on Coumadin at this time Midodrine 10mg  tid Checking TTE today  RENAL A:  Acute on Chronic Renal Failure  H/O Left Nephrectomy for RCC  P:  Nephrology following Continuing CRRT Monitoring electrolytes per nephrology  GASTROINTESTINAL A:  H/O Dysphagia Constipation - Resolved.  P:  Continuing Tube Feedings Protonix VT qday D/C Senna & Colace   HEMATOLOGIC / ONCOLOGIC A: Anemia - Secondary to acute blood loss from Rt perinephric hematoma. Hgb Stable. Leukocytosis - Unclear etiology. Improving. H/O Left RCC - S/P nephrectomy remotely. H/O Anemia  P:  Trending Cell Counts daily w/ CBC Heparin gtt D/C SCDs  INFECTIOUS A:  Sepsis Klebsiella Bacteremia - Completed Antibiotic course 5/10.  P:  No antibiotics currently Trending fever curve & leukocytosis  ENDOCRINE A:  DM - BG better controlled.  P:  AccuChecks q4hr Moderate Dose SSI per algorithm  Increase NPH to 15u [place] q12hr tonight  NEUROLOGIC A:  Acute Encephalopathy - Possibly secondary to metabolic encephalopathy & shock. Physical Deconditioning  P:  RASS goal: 0 to -1 Fentanyl gtt & IV prn Versed IV prn  TODAY'S SUMMARY:  [name] w/ h/o Lt renal cell carcinoma s/p Lt nephrectomy, prostate cancer s/p XRT, PE in 2008, OSA, HTN, HLD, DM, A fib. Now with hemorrhagic shock leading to cardiac arrest, VDRF, AKI from Rt perinephric hematoma. Plan for tracheostomy this week and  continue ventilator wean. Persistent hypotension is unclear in etiology. Repeating TTE today. Considering cardiology consult depending on this finding.  I have spent a total of 34 minutes of critical care time today caring for the patient, updating his wife at bedside, & reviewing his electronic medical record.  Sonia Baller Ashok Cordia, M.D. [facility] Pulmonary & Critical Care Pager:  [phone] After 3pm or if no response, call [phone]  06/28/2015, 8:21 AM

## 2015-06-28 NOTE — Progress Notes (Signed)
Pt was bagged lavage, got back moderate amount of tan thick secretions. Pt was stable throughout suctioning procedure. Pt was desatting prior to bag lavage noted secretions coming up the ETT. Pt is now stable. No complications noted

## 2015-06-29 ENCOUNTER — Telehealth: Payer: Self-pay | Admitting: *Deleted

## 2015-06-29 ENCOUNTER — Other Ambulatory Visit: Payer: Self-pay | Admitting: Otolaryngology

## 2015-06-29 LAB — RENAL FUNCTION PANEL
ANION GAP: 8 (ref 5–15)
ANION GAP: 7 (ref 5–15)
Albumin: 2.3 g/dL — ABNORMAL LOW (ref 3.5–5.0)
Albumin: 2.4 g/dL — ABNORMAL LOW (ref 3.5–5.0)
BUN: 31 mg/dL — AB (ref 6–20)
BUN: 27 mg/dL — ABNORMAL HIGH (ref 6–20)
CALCIUM: 8.8 mg/dL — AB (ref 8.9–10.3)
CHLORIDE: 101 mmol/L (ref 101–111)
CO2: 27 mmol/L (ref 22–32)
CO2: 28 mmol/L (ref 22–32)
CREATININE: 1.11 mg/dL (ref 0.61–1.24)
Calcium: 8.6 mg/dL — ABNORMAL LOW (ref 8.9–10.3)
Chloride: 102 mmol/L (ref 101–111)
Creatinine, Ser: 1.13 mg/dL (ref 0.61–1.24)
GFR calc Af Amer: >60 mL/min (ref >60)
GFR calc non Af Amer: >60 mL/min (ref >60)
GLUCOSE: 209 mg/dL — AB (ref 65–99)
Glucose, Bld: 183 mg/dL — ABNORMAL HIGH (ref 65–99)
POTASSIUM: 5.4 mmol/L — AB (ref 3.5–5.1)
Phosphorus: 2.3 mg/dL — ABNORMAL LOW (ref 2.5–4.6)
Phosphorus: 2.2 mg/dL — ABNORMAL LOW (ref 2.5–4.6)
Potassium: 4.7 mmol/L (ref 3.5–5.1)
SODIUM: 137 mmol/L (ref 135–145)
Sodium: 136 mmol/L (ref 135–145)

## 2015-06-29 LAB — CBC
HCT: 28.5 % — ABNORMAL LOW (ref 39.0–52.0)
HEMOGLOBIN: 8.2 g/dL — AB (ref 13.0–17.0)
MCH: 27.9 pg (ref 26.0–34.0)
MCHC: 28.8 g/dL — ABNORMAL LOW (ref 30.0–36.0)
MCV: 96.9 fL (ref 78.0–100.0)
PLATELETS: 183 10*3/uL (ref 150–400)
RBC: 2.94 MIL/uL — AB (ref 4.22–5.81)
RDW: 21.5 % — ABNORMAL HIGH (ref 11.5–15.5)
WBC: 14.2 10*3/uL — AB (ref 4.0–10.5)

## 2015-06-29 LAB — GLUCOSE, CAPILLARY
GLUCOSE-CAPILLARY: 189 mg/dL — AB (ref 65–99)
GLUCOSE-CAPILLARY: 221 mg/dL — AB (ref 65–99)
GLUCOSE-CAPILLARY: 244 mg/dL — AB (ref 65–99)
Glucose-Capillary: 190 mg/dL — ABNORMAL HIGH (ref 65–99)
Glucose-Capillary: 188 mg/dL — ABNORMAL HIGH (ref 65–99)
Glucose-Capillary: 137 mg/dL — ABNORMAL HIGH (ref 65–99)

## 2015-06-29 LAB — HEPARIN LEVEL (UNFRACTIONATED)
Heparin Unfractionated: 0.81 IU/mL — ABNORMAL HIGH (ref 0.30–0.70)
Heparin Unfractionated: 0.59 IU/mL (ref 0.30–0.70)

## 2015-06-29 LAB — MAGNESIUM: MAGNESIUM: 2.5 mg/dL — AB (ref 1.7–2.4)

## 2015-06-29 MED ORDER — VITAL HIGH PROTEIN PO LIQD
1000.0000 mL | ORAL | Status: DC
  Administered 2015-07-01 – 2015-07-04 (×3): 1000 mL

## 2015-06-29 MED ORDER — PRO-STAT SUGAR FREE PO LIQD
30.0000 mL | Freq: Four times a day (QID) | ORAL | Status: DC
  Administered 2015-06-29 – 2015-07-05 (×23): 30 mL
  Filled 2015-06-29 (×24): qty 30

## 2015-06-29 MED ORDER — INSULIN NPH (HUMAN) (ISOPHANE) 100 UNIT/ML ~~LOC~~ SUSP
25.0000 [IU] | Freq: Two times a day (BID) | SUBCUTANEOUS | Status: DC
  Administered 2015-06-29 – 2015-07-02 (×7): 25 [IU] via SUBCUTANEOUS
  Filled 2015-06-29 (×2): qty 10

## 2015-06-29 NOTE — Telephone Encounter (Signed)
[id number] left message to call back for any needs/ [facility]

## 2015-06-29 NOTE — Progress Notes (Signed)
ANTICOAGULATION CONSULT NOTE - Follow-up Consult  Pharmacy Consult for heparin Indication: DVT  No Known Allergies  Patient Measurements: Height: 5\' 9"  (175.3 cm) Weight: 242 lb 15.2 oz (110.2 kg) IBW/kg (Calculated) : 70.7 Heparin Dosing Weight: 97.5 kg  Vital Signs: Temp: 98.7 F (37.1 C) (05/24 1112) Temp Source: Oral (05/24 1112) BP: 72/47 mmHg (05/24 1300) Pulse Rate: 96 (05/24 1300)  Labs:  Recent Labs  06/27/15 0500  06/28/15 0445 06/28/15 1558 06/29/15 0418 06/29/15 1250  HGB 7.9*  --  7.9*  --  8.2*  --   HCT 27.0*  --  27.7*  --  28.5*  --   PLT 201  --  203  --  183  --   HEPARINUNFRC 0.53  --  0.65  --  0.81* 0.59  CREATININE 1.26*  < > 1.22 1.16 1.13  --   < > = values in this interval not displayed.  Estimated Creatinine Clearance: 71.2 mL/min (by C-G formula based on Cr of 1.13).  Medications: Heparin @ 1150 units/hr   Assessment: 75 y.o. male admitted on 05/31/2015 with sepsis. Transferred to ICU s/p cardiac arrest with hemorrhagic and septic shock on 5/3  PMH: prostate ca s/p radiation 2011, renal cell ca s/p left nephrectomy, afib/hx of dvt on warfarin  Anticoagulation: Coumadin PTA for AFib + hx DVT/PE.  5/10 HIT panel neg, SRA neg 5/11 Dopplers: + acute DVT in R leg On Heparin for acute DVT at 1100 units/hr with therapeutic level of 0.59  Nephrology: One kidney, due to renal embolization + previous nephrectomy. On CRRT since 5/3. Minimal UOP >> unlikely renal recovery per Nephro. Tunneled cath placed 5/12, but have not started HD  Hematology / Oncology: H&H 7.9/27, Plt 201  Goal of Therapy:  Heparin level 0.3-0.7 units/mL  Monitor platelets by anticoagulation protocol: Yes   Plan:  Continue heparin at 1100 units/hr Daily heparin level, CBC F/U trach plans and holding of heparin  Levester Fresh, PharmD, BCPS, [facility] Clinical Pharmacist Pager [phone] 06/29/2015 1:24 PM

## 2015-06-29 NOTE — Progress Notes (Signed)
Nutrition Follow-up  DOCUMENTATION CODES:   Obesity unspecified  INTERVENTION:    Continue Vital High Protein via NGT decrease goal rate to 50 ml/h (1200 ml per day) with Prostat 30 ml QID to provide 1600 kcals, 165 gm protein, 1003 ml free water per day.  NUTRITION DIAGNOSIS:   Inadequate oral intake related to inability to eat as evidenced by NPO status.  Ongoing   GOAL:   Patient will meet greater than or equal to 90% of their needs  Met  MONITOR:   Labs, Weight trends, I & O's, TF tolerance, Skin  ASSESSMENT:   75 y.o. male with PMH as outlined below including prostate CA s/p radiation 2011 and RCC s/p left nephrectomy. He was seen in ED 4/22 for clogged catheter which was irrigated and replaced. He apparently has had hematuria for quite some time which has been attributed to scarring from prior radiation.  Discussed patient in ICU rounds and with RN today. Plans for trach by ENT vs. another trial of extubation. 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 Remains on CRRT. Patient remains intubated on ventilator support Temp (24hrs), Avg:98 F (36.7 C), Min:97.3 F (36.3 C), Max:98.7 F (37.1 C)  Diet Order:  Diet NPO time specified  Skin:  Reviewed, no issues  Last BM:  5/23  Height:   Ht Readings from Last 1 Encounters:  06/08/15 5' 9" (1.753 m)    Weight:   Wt Readings from Last 1 Encounters:  06/29/15 242 lb 15.2 oz (110.2 kg)    Ideal Body Weight:  72.7 kg  BMI:  Body mass index is 35.86 kg/(m^2).  Estimated Nutritional Needs:   Kcal:  0626-9485  Protein:  145-181 gm  Fluid:  2 L  EDUCATION NEEDS:   No education needs identified at this time  Molli Barrows, [place], [place], [place] Pager [phone] After Hours Pager [phone]

## 2015-06-29 NOTE — Progress Notes (Signed)
Patient ID: [name], male   DOB: 15-Jul-1940, 75 y.o.   MRN: [id number]  [facility] Progress Note   Assessment/ Plan:   1. 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 Continue current CRRT prescription for regulation of metabolic abnormality/clearance. He remains on pressors/intubated-we'll continue CRRT at current prescription. No ultrafiltration at this time given physical exam findings. 2. Healthcare associated pneumonia: With VDRF, he has completed the duration of antibiotic therapy. On midodrine to allow for weaning off Levophed. 3. Atrial fibrillation: Paroxysmal and currently appears to be in sinus rhythm 4. Anemia with recent hemorrhagic shock/acute blood loss anemia from right perinephric hematoma. Low iron saturation but high ferritin prohibitive to intravenous iron therapy 5. Hypophosphatemia: Secondary to CRRT losses, monitor with replacement via intravenous route per protocol  Subjective:   No acute events overnight -He indicates he is comfortable.    Objective:   BP 93/61 mmHg  Pulse 73  Temp(Src) 97.3 F (36.3 C) (Oral)  Resp 19  Ht '5\' 9"'  (1.753 m)  Wt 110.2 kg (242 lb 15.2 oz)  BMI 35.86 kg/m2  SpO2 100%  Intake/Output Summary (Last 24 hours) at 06/29/15 9753 Last data filed at 06/29/15 0800  Gross per 24 hour  Intake 2564.55 ml  Output   2587 ml  Net -22.45 ml   Weight change: 1.5 kg (3 lb 4.9 oz)  Physical Exam: Gen: Appears to be comfortable on ventilator/CRRT-opens eyes and nods to conversation CVS: Pulse regular rhythm, S1 and S2 normal Resp: Coarse breath sounds bilaterally, no rales Abd: Soft, obese, nontender Ext: Trace lower extremity edema  Imaging: No results  found.  Labs: BMET  Recent Labs Lab 06/26/15 0500 06/26/15 1642 06/27/15 0500 06/27/15 1600 06/28/15 0445 06/28/15 1558 06/29/15 0418  NA 136 136 136 136 136 137 136  K 5.0 4.9 4.7 4.6 4.7 4.7 5.4*  CL 97* 101 101 102 101 102 101  CO2 '26 26 27 26 26 27 27  ' GLUCOSE 299* 261* 237* 182* 230* 223* 209*  BUN 38* 36* 36* 31* 36* 33* 31*  CREATININE 1.39* 1.32* 1.26* 1.19 1.22 1.16 1.13  CALCIUM 8.9 8.9 8.8* 8.8* 8.8* 8.7* 8.6*  PHOS 2.7 2.7 2.3* 2.0* 2.4* 2.2* 2.3*   CBC  Recent Labs Lab 06/26/15 0500 06/27/15 0500 06/28/15 0445 06/29/15 0418  WBC 22.5* 19.4* 15.5* 14.2*  HGB 8.2* 7.9* 7.9* 8.2*  HCT 27.7* 27.0* 27.7* 28.5*  MCV 93.6 96.8 95.8 96.9  PLT 274 201 203 183    Medications:    . antiseptic oral rinse  7 mL Mouth Rinse QID  . chlorhexidine gluconate (SAGE KIT)  15 mL Mouth Rinse BID  . feeding supplement (PRO-STAT SUGAR FREE 64)  30 mL Per Tube TID  . insulin aspart  0-15 Units Subcutaneous Q4H  . insulin NPH Human  15 Units Subcutaneous BID AC & HS  . midodrine  10 mg Oral TID AC  . pantoprazole sodium  40 mg Per Tube Q1200  . sodium chloride flush  10-40 mL Intracatheter Q12H   Elmarie Shiley, MD 06/29/2015, 8:06 AM

## 2015-06-29 NOTE — Progress Notes (Signed)
ANTICOAGULATION CONSULT NOTE - Follow-up Consult  Pharmacy Consult for heparin Indication: DVT  No Known Allergies  Patient Measurements: Height: 5\' 9"  (175.3 cm) Weight: 239 lb 10.2 oz (108.7 kg) IBW/kg (Calculated) : 70.7 Heparin Dosing Weight: 97.5 kg  Vital Signs: Temp: 97.3 F (36.3 C) (05/24 0428) Temp Source: Oral (05/24 0428) BP: 95/68 mmHg (05/24 0430) Pulse Rate: 79 (05/24 0430)  Labs:  Recent Labs  06/27/15 0500 06/27/15 1600 06/28/15 0445 06/28/15 1558 06/29/15 0418  HGB 7.9*  --  7.9*  --  8.2*  HCT 27.0*  --  27.7*  --  28.5*  PLT 201  --  203  --  183  HEPARINUNFRC 0.53  --  0.65  --  0.81*  CREATININE 1.26* 1.19 1.22 1.16  --     Estimated Creatinine Clearance: 68.9 mL/min (by C-G formula based on Cr of 1.16).  Medications: Heparin @ 1150 units/hr   Assessment: 75 y.o. male admitted on 05/31/2015 with sepsis. Transferred to ICU s/p cardiac arrest with hemorrhagic and septic shock on 5/3  PMH: prostate ca s/p radiation 2011, renal cell ca s/p left nephrectomy, afib/hx of dvt on warfarin  Anticoagulation: Coumadin PTA for AFib + hx DVT/PE.  5/10 HIT panel neg, SRA neg 5/11 Dopplers: + acute DVT in R leg  This morning's HL is supratherapeutic at 0.81 on heparin 1200 units/hr. Nurse reports no issues with infusion or bleeding.  Goal of Therapy:  Heparin level 0.3-0.7 units/mL  Monitor platelets by anticoagulation protocol: Yes   Plan:  Reduce heparin to 1100 units/hr 8h HL Daily heparin level, CBC  Andrey Cota. Diona Foley, PharmD, [facility] Clinical Pharmacist Pager [phone]  06/29/2015 4:54 AM

## 2015-06-29 NOTE — Consult Note (Signed)
Reason for Consult: resp failure Referring Physician: CCM  [name] is an 75 y.o. male.  HPI: asked to see patient for tracheotomy. He has been intubated up until 5/13 then reintubated on 5/16 for secretions. He now has been on weaning protocol and by nurses hx seems ready to extubate again. He currently doesn't seem to have excessive secretions.   Past Medical History  Diagnosis Date  . Obesity   . Phlebitis     Lower extermity  . Pulmonary emboli ([place]) 2008    submassive, saddle  . Prostate cancer ([place]) 07/2009  . Sleep apnea     on CPAP  . Hx of echocardiogram 12/04/2010    Normal EF >55% no significant valve disease  . History of stress test 06/27/2009    Low risk and EF of approximately 50%  . DVT (deep venous thrombosis) ([place])   . Chronic kidney disease, stage 3     baseline creatinine ~1.4  . HLD (hyperlipidemia)   . HTN (hypertension)   . Dysrhythmia     A fib  . Diabetes mellitus ([place])     diet controlled  . History of hiatal hernia     Past Surgical History  Procedure Laterality Date  . Nephrectomy  1999    for CA  . Cholecystectomy  1999  . Transurethral resection of bladder tumor N/A 03/04/2013    Procedure: CYSTOSCOPY WITH RIGHT RETROGRADE PYELOGRAM AND BLADDER BIOPSY /CLOT EVACUATION/ BIOPSY PROSTATIC URETHRA WITH FULGERATION ;  Surgeon: Molli Hazard, MD;  Location: [facility];  Service: Urology;  Laterality: N/A;  . Cystoscopy w/ retrogrades N/A 04/08/2013    Procedure: CYSTOSCOPY WITH CLOT EVACUATION;  Surgeon: Molli Hazard, MD;  Location: [facility];  Service: Urology;  Laterality: N/A;  . Left shoulder repair    . Shoulder open rotator cuff repair Right 05/03/2015    Procedure: RIGHT SHOULDER ROTATOR CUFF REPAIR OPEN WITH GRAFT AND ANCHOR ;  Surgeon: Latanya Maudlin, MD;  Location: [facility];  Service: Orthopedics;  Laterality: Right;    Family History  Problem Relation Age of Onset  . Cancer Mother   . Diabetes Father   . Cancer Father      Social History:  reports that he has never smoked. He has never used smokeless tobacco. He reports that he drinks alcohol. He reports that he does not use illicit drugs.  Allergies: No Known Allergies  Medications: I have reviewed the patient's current medications.  Results for orders placed or performed during the hospital encounter of 05/31/15 (from the past 48 hour(s))  Glucose, capillary     Status: Abnormal   Collection Time: 06/27/15  3:11 PM  Result Value Ref Range   Glucose-Capillary 175 (H) 65 - 99 mg/dL  Renal function panel (daily at 1600)     Status: Abnormal   Collection Time: 06/27/15  4:00 PM  Result Value Ref Range   Sodium 136 135 - 145 mmol/L   Potassium 4.6 3.5 - 5.1 mmol/L   Chloride 102 101 - 111 mmol/L   CO2 26 22 - 32 mmol/L   Glucose, Bld 182 (H) 65 - 99 mg/dL   BUN 31 (H) 6 - 20 mg/dL   Creatinine, Ser 1.19 0.61 - 1.24 mg/dL   Calcium 8.8 (L) 8.9 - 10.3 mg/dL   Phosphorus 2.0 (L) 2.5 - 4.6 mg/dL   Albumin 2.5 (L) 3.5 - 5.0 g/dL   GFR calc non Af Amer 59 (L) >60 mL/min   GFR calc  Af Amer >60 >60 mL/min    Comment: (NOTE) The eGFR has been calculated using the CKD EPI equation. This calculation has not been validated in all clinical situations. eGFR's persistently <60 mL/min signify possible Chronic Kidney Disease.    Anion gap 8 5 - 15  Glucose, capillary     Status: Abnormal   Collection Time: 06/27/15  7:56 PM  Result Value Ref Range   Glucose-Capillary 232 (H) 65 - 99 mg/dL  Glucose, capillary     Status: Abnormal   Collection Time: 06/28/15 12:12 AM  Result Value Ref Range   Glucose-Capillary 189 (H) 65 - 99 mg/dL  Glucose, capillary     Status: Abnormal   Collection Time: 06/28/15  3:37 AM  Result Value Ref Range   Glucose-Capillary 193 (H) 65 - 99 mg/dL  Magnesium     Status: Abnormal   Collection Time: 06/28/15  4:45 AM  Result Value Ref Range   Magnesium 2.5 (H) 1.7 - 2.4 mg/dL  Heparin level (unfractionated)     Status: None    Collection Time: 06/28/15  4:45 AM  Result Value Ref Range   Heparin Unfractionated 0.65 0.30 - 0.70 IU/mL    Comment:        IF HEPARIN RESULTS ARE BELOW EXPECTED VALUES, AND PATIENT DOSAGE HAS BEEN CONFIRMED, SUGGEST FOLLOW UP TESTING OF ANTITHROMBIN III LEVELS.   CBC     Status: Abnormal   Collection Time: 06/28/15  4:45 AM  Result Value Ref Range   WBC 15.5 (H) 4.0 - 10.5 K/uL   RBC 2.89 (L) 4.22 - 5.81 MIL/uL   Hemoglobin 7.9 (L) 13.0 - 17.0 g/dL   HCT 27.7 (L) 39.0 - 52.0 %   MCV 95.8 78.0 - 100.0 fL   MCH 27.3 26.0 - 34.0 pg   MCHC 28.5 (L) 30.0 - 36.0 g/dL   RDW 21.5 (H) 11.5 - 15.5 %   Platelets 203 150 - 400 K/uL  Renal function panel     Status: Abnormal   Collection Time: 06/28/15  4:45 AM  Result Value Ref Range   Sodium 136 135 - 145 mmol/L   Potassium 4.7 3.5 - 5.1 mmol/L   Chloride 101 101 - 111 mmol/L   CO2 26 22 - 32 mmol/L   Glucose, Bld 230 (H) 65 - 99 mg/dL   BUN 36 (H) 6 - 20 mg/dL   Creatinine, Ser 1.22 0.61 - 1.24 mg/dL   Calcium 8.8 (L) 8.9 - 10.3 mg/dL   Phosphorus 2.4 (L) 2.5 - 4.6 mg/dL   Albumin 2.4 (L) 3.5 - 5.0 g/dL   GFR calc non Af Amer 57 (L) >60 mL/min   GFR calc Af Amer >60 >60 mL/min    Comment: (NOTE) The eGFR has been calculated using the CKD EPI equation. This calculation has not been validated in all clinical situations. eGFR's persistently <60 mL/min signify possible Chronic Kidney Disease.    Anion gap 9 5 - 15  Glucose, capillary     Status: Abnormal   Collection Time: 06/28/15  8:17 AM  Result Value Ref Range   Glucose-Capillary 244 (H) 65 - 99 mg/dL  Ferritin     Status: Abnormal   Collection Time: 06/28/15  9:07 AM  Result Value Ref Range   Ferritin 1421 (H) 24 - 336 ng/mL  Iron and TIBC     Status: Abnormal   Collection Time: 06/28/15  9:07 AM  Result Value Ref Range   Iron 46 45 - 182 ug/dL  TIBC 272 250 - 450 ug/dL   Saturation Ratios 17 (L) 17.9 - 39.5 %   UIBC 226 ug/dL  Glucose, capillary     Status:  Abnormal   Collection Time: 06/28/15 11:49 AM  Result Value Ref Range   Glucose-Capillary 207 (H) 65 - 99 mg/dL  Glucose, capillary     Status: Abnormal   Collection Time: 06/28/15  3:02 PM  Result Value Ref Range   Glucose-Capillary 202 (H) 65 - 99 mg/dL  Renal function panel (daily at 1600)     Status: Abnormal   Collection Time: 06/28/15  3:58 PM  Result Value Ref Range   Sodium 137 135 - 145 mmol/L   Potassium 4.7 3.5 - 5.1 mmol/L   Chloride 102 101 - 111 mmol/L   CO2 27 22 - 32 mmol/L   Glucose, Bld 223 (H) 65 - 99 mg/dL   BUN 33 (H) 6 - 20 mg/dL   Creatinine, Ser 1.16 0.61 - 1.24 mg/dL   Calcium 8.7 (L) 8.9 - 10.3 mg/dL   Phosphorus 2.2 (L) 2.5 - 4.6 mg/dL   Albumin 2.4 (L) 3.5 - 5.0 g/dL   GFR calc non Af Amer >60 >60 mL/min   GFR calc Af Amer >60 >60 mL/min    Comment: (NOTE) The eGFR has been calculated using the CKD EPI equation. This calculation has not been validated in all clinical situations. eGFR's persistently <60 mL/min signify possible Chronic Kidney Disease.    Anion gap 8 5 - 15  Glucose, capillary     Status: Abnormal   Collection Time: 06/28/15  8:41 PM  Result Value Ref Range   Glucose-Capillary 192 (H) 65 - 99 mg/dL  Glucose, capillary     Status: Abnormal   Collection Time: 06/28/15 11:34 PM  Result Value Ref Range   Glucose-Capillary 221 (H) 65 - 99 mg/dL   Comment 1 Notify RN   Renal function panel (daily at 0500)     Status: Abnormal   Collection Time: 06/29/15  4:18 AM  Result Value Ref Range   Sodium 136 135 - 145 mmol/L   Potassium 5.4 (H) 3.5 - 5.1 mmol/L   Chloride 101 101 - 111 mmol/L   CO2 27 22 - 32 mmol/L   Glucose, Bld 209 (H) 65 - 99 mg/dL   BUN 31 (H) 6 - 20 mg/dL   Creatinine, Ser 1.13 0.61 - 1.24 mg/dL   Calcium 8.6 (L) 8.9 - 10.3 mg/dL   Phosphorus 2.3 (L) 2.5 - 4.6 mg/dL   Albumin 2.3 (L) 3.5 - 5.0 g/dL   GFR calc non Af Amer >60 >60 mL/min   GFR calc Af Amer >60 >60 mL/min    Comment: (NOTE) The eGFR has been  calculated using the CKD EPI equation. This calculation has not been validated in all clinical situations. eGFR's persistently <60 mL/min signify possible Chronic Kidney Disease.    Anion gap 8 5 - 15  Magnesium     Status: Abnormal   Collection Time: 06/29/15  4:18 AM  Result Value Ref Range   Magnesium 2.5 (H) 1.7 - 2.4 mg/dL  Heparin level (unfractionated)     Status: Abnormal   Collection Time: 06/29/15  4:18 AM  Result Value Ref Range   Heparin Unfractionated 0.81 (H) 0.30 - 0.70 IU/mL    Comment:        IF HEPARIN RESULTS ARE BELOW EXPECTED VALUES, AND PATIENT DOSAGE HAS BEEN CONFIRMED, SUGGEST FOLLOW UP TESTING OF ANTITHROMBIN III LEVELS.  CBC     Status: Abnormal   Collection Time: 06/29/15  4:18 AM  Result Value Ref Range   WBC 14.2 (H) 4.0 - 10.5 K/uL   RBC 2.94 (L) 4.22 - 5.81 MIL/uL   Hemoglobin 8.2 (L) 13.0 - 17.0 g/dL   HCT 28.5 (L) 39.0 - 52.0 %   MCV 96.9 78.0 - 100.0 fL   MCH 27.9 26.0 - 34.0 pg   MCHC 28.8 (L) 30.0 - 36.0 g/dL   RDW 21.5 (H) 11.5 - 15.5 %   Platelets 183 150 - 400 K/uL  Glucose, capillary     Status: Abnormal   Collection Time: 06/29/15  4:27 AM  Result Value Ref Range   Glucose-Capillary 190 (H) 65 - 99 mg/dL   Comment 1 Notify RN   Glucose, capillary     Status: Abnormal   Collection Time: 06/29/15 11:10 AM  Result Value Ref Range   Glucose-Capillary 188 (H) 65 - 99 mg/dL    No results found.  ROS Blood pressure 95/54, pulse 75, temperature 98.7 F (37.1 C), temperature source Oral, resp. rate 19, height _0  (1.753 m), weight 110.2 kg (242 lb 15.2 oz), SpO2 100 %. Physical Exam  Constitutional: He appears well-developed.  HENT:  Awake and alert. He understands conversation. ETT in mouth and NGT in place. Neck he has a low cricoid. No masses  Eyes: Pupils are equal, round, and reactive to light.  Neck: Neck supple.    Assessment/Plan: Respiratory failure- he has weaned to point of extubation but the CCM is worried about  secretions and reintubation again. The nurse is not reporting excessive secretions but he does have them with respiratory bag lavage.   I have discussed trach with the patient and wife.Discussed risks,benefits, and options. All questions answered and consent obtained.  They certainly prefer extubation but are willing to do what is needed. I will talk to the CCM and decide if another trial of extubation is warranted.   Melissa Montane 06/29/2015, 11:58 AM

## 2015-06-29 NOTE — Progress Notes (Signed)
CSW continues to follow for disposition and CIR SNF backup placement when Patient is medically stable. Plan is for tracheostomy this week and continue ventilator wean.          Emiliano Dyer, LCSW [facility] ED/29M Clinical Social Worker [phone]

## 2015-06-29 NOTE — Progress Notes (Signed)
PULMONARY / CRITICAL CARE MEDICINE   Name: [name] MRN: [id number] DOB: 1940-10-01    ADMISSION DATE:  05/31/2015  REFERRING MD:  EDP  CHIEF COMPLAINT:  Hypotension   SUBJECTIVE: No acute events overnight. Vasopressor requirement continuing to fluctuate.  REVIEW OF SYSTEMS: Unobtainable as the patient is currently intubated.  VITAL SIGNS: BP 86/68 mmHg  Pulse 72  Temp(Src) 97.3 F (36.3 C) (Oral)  Resp 19  Ht 5\' 9"  (1.753 m)  Wt 242 lb 15.2 oz (110.2 kg)  BMI 35.86 kg/m2  SpO2 100%  INTAKE / OUTPUT: I/O last 3 completed shifts: In: 4087.5 [I.V.:1387.5; NG/GT:2700] Out: [id number] [Other:4178]  PHYSICAL EXAMINATION: General:  Eyes closed but opens to voice. No acute distress. No family at bedside.  Integument:  Warm & dry. No rash on exposed skin. Mild ischemic changes left foot unchanged. HEENT:  No scleral injection. Endotracheal tube in place. PERRL. Cardiovascular:  Regular rate with irregular rhythm in atrial fibrillation on telemetry. No edema. No appreciable JVD.  Pulmonary:  Clear bilaterally to auscultation. Symmetric chest rise on ventilator. Abdomen: Soft. Normal bowel sounds. Nondistended.  Neurological:  Moving all 4 extremities equally. Follows commands. Nods to questions.  LABS:  BMET  Recent Labs Lab 06/28/15 0445 06/28/15 1558 06/29/15 0418  NA 136 137 136  K 4.7 4.7 5.4*  CL 101 102 101  CO2 26 27 27   BUN 36* 33* 31*  CREATININE 1.22 1.16 1.13  GLUCOSE 230* 223* 209*    Electrolytes  Recent Labs Lab 06/27/15 0500  06/28/15 0445 06/28/15 1558 06/29/15 0418  CALCIUM 8.8*  < > 8.8* 8.7* 8.6*  MG 2.5*  --  2.5*  --  2.5*  PHOS 2.3*  < > 2.4* 2.2* 2.3*  < > = values in this interval not displayed.  CBC  Recent Labs Lab 06/27/15 0500 06/28/15 0445 06/29/15 0418  WBC 19.4* 15.5* 14.2*  HGB 7.9* 7.9* 8.2*  HCT 27.0* 27.7* 28.5*  PLT 201 203 183    Coag's No results for input(s): APTT, INR in the last 168 hours.  Sepsis  Markers No results for input(s): LATICACIDVEN, PROCALCITON, O2SATVEN in the last 168 hours.  ABG No results for input(s): PHART, PCO2ART, PO2ART in the last 168 hours.  Liver Enzymes  Recent Labs Lab 06/28/15 0445 06/28/15 1558 06/29/15 0418  ALBUMIN 2.4* 2.4* 2.3*    Cardiac Enzymes No results for input(s): TROPONINI, PROBNP in the last 168 hours.  Glucose  Recent Labs Lab 06/28/15 0817 06/28/15 1149 06/28/15 1502 06/28/15 2041 06/28/15 2334 06/29/15 0427  GLUCAP 244* 207* 202* 192* 221* 190*    Imaging No results found.   STUDIES:  4/26 Renal u/s >> s/p Lt nephrectomy 4/27 CT abd/pelvis >> Rt perinephric hematoma 5/02 CT abd/pelvis >> increased size of hematoma 5/08 Echo >> EF 65 to 70% 5/18 Doppler legs b/l >> Acute DVT Rt gastrocnemius vein, age indeterminate DVT Rt femoral vein and popliteal vein 5/22 Port CXR: Support lines and tubes remain in position. Low lung volumes. Partial silhouetting of left hemidiaphragm without obvious opacity. 5/23 TTE:  Moderate LVH. EF 65-70%. No wall motion abnormalities. LA mildly dilated. No AS or AR. No MR. RV normal in size & function. No effusion.  CULTURES: 4/25 Blood >> Klebsiella pneumoniae 4/27 Blood >> negative 5/16 BAL >> oral flora  ANTIBIOTICS: 4/28 Rocephin >> 5/10  SIGNIFICANT EVENTS: 4/25 Admitted with septic shock due to UTI. Significant ARF. 4/30 transferred out of ICU. 5/03 Cardiac arrest, anemia >> coil  embolization Rt kidney by IR; PTX post-CPR 5/16 Reintubated due to airway secretions 5/18 start heparin gtt  LINES/TUBES: Rt IJ HD cath 4/26 - 5/12 ETT 5/3 - 5/13; 5/16 >> Lt femoral CVL 5/3 - 5/17 Lt chest tube 5/3 - 5/14 Rt chest tube 5/3 - 5/14 Rt IJ tunneled HD cath 5/12  >>  Lt [place] CVL 5/17 >>  NGT 5/8 >> PIV x1  ASSESSMENT / PLAN:  PULMONARY A: Acute Hypoxic Respiratory Failure - Post Cardiac Arrest. Pneumothorax after CPR 5/03 - chest tubes removed 5/14. H/O OSA. Re-intubated  5/16 due to respiratory secretions.  P:  Continuing Pressure Support Wean Plan for tracheostomy by ENT  CARDIOVASCULAR A: S/P Cardiac Arrest Shock - Hemorrhagic secondary to Rt perinephric hematoma. Questionable cuff pressure readings. Right Gastocnemious vein acute DVT Intermediate age DVT Right femoral & popliteal veins Ischemic Changes Left Foot - Unchanged.  P:  Levophed to keep MAP > 65 Hold outpt coreg, tricor, lasix, niacin, accupril Continue heparin gtt  Holding on Coumadin at this time Midodrine 10mg  tid  RENAL A:  Acute on Chronic Renal Failure  Hyperkalemia - Mild. Hypophosphatemia - Mild.  H/O Left Nephrectomy for RCC  P:  Nephrology following Continuing CRRT Monitoring electrolytes per nephrology  GASTROINTESTINAL A:  H/O Dysphagia Constipation - Resolved.  P:  Continuing Tube Feedings Protonix VT qday  HEMATOLOGIC / ONCOLOGIC A: Anemia - Secondary to acute blood loss from Rt perinephric hematoma. Hgb Stable. Leukocytosis - Unclear etiology. Improving. H/O Left RCC - S/P nephrectomy remotely. H/O Anemia  P:  Trending Cell Counts daily w/ CBC Heparin gtt  INFECTIOUS A:  Sepsis Klebsiella Bacteremia - Completed Antibiotic course 5/10.  P:  No antibiotics currently Trending fever curve & leukocytosis  ENDOCRINE A:  DM - BG better controlled but still elevated.  P:  AccuChecks q4hr Moderate Dose SSI per algorithm  Increase NPH to 25u [place] q12hr   NEUROLOGIC A:  Acute Encephalopathy - Possibly secondary to metabolic encephalopathy & shock. Physical Deconditioning  P:  RASS goal: 0 to -1 Fentanyl gtt & IV prn Versed IV prn  FAMILY UPDATE: Wife updated at bedside on 5/24 by Dr. Ashok Cordia.  TODAY'S SUMMARY:  [name] w/ h/o Lt renal cell carcinoma s/p Lt nephrectomy, prostate cancer s/p XRT, PE in 2008, OSA, HTN, HLD, DM, A fib. Now with hemorrhagic shock leading to cardiac arrest, VDRF, AKI from Rt  perinephric hematoma. Plan for tracheostomy this week with ENT. Hypotension is concerning but I believe this is due to ongoing dialysis. Vision has no evidence for ongoing bleeding, infection, or cardiac in etiology. Hopefully once tracheostomy is placed and fentanyl infusion is able to be completely removed blood pressure will improve slightly.  I have spent a total of 33 minutes of critical care time today caring for the patient, updating his wife at bedside, & reviewing his electronic medical record.  Sonia Baller Ashok Cordia, M.D. [facility]monary [facility] Critical [facility] Pager:  [phone] After 3pm or if no response, call [phone]  06/29/2015, 6:34 AM

## 2015-06-30 ENCOUNTER — Inpatient Hospital Stay (HOSPITAL_COMMUNITY): Payer: BLUE CROSS/BLUE SHIELD

## 2015-06-30 DIAGNOSIS — I998 Other disorder of circulatory system: Secondary | ICD-10-CM

## 2015-06-30 LAB — RENAL FUNCTION PANEL
ALBUMIN: 2.3 g/dL — AB (ref 3.5–5.0)
ANION GAP: 7 (ref 5–15)
Albumin: 2.4 g/dL — ABNORMAL LOW (ref 3.5–5.0)
Anion gap: 8 (ref 5–15)
BUN: 29 mg/dL — ABNORMAL HIGH (ref 6–20)
BUN: 27 mg/dL — ABNORMAL HIGH (ref 6–20)
CALCIUM: 8.5 mg/dL — AB (ref 8.9–10.3)
CHLORIDE: 102 mmol/L (ref 101–111)
CO2: 26 mmol/L (ref 22–32)
CO2: 26 mmol/L (ref 22–32)
CREATININE: 1.19 mg/dL (ref 0.61–1.24)
Calcium: 8.8 mg/dL — ABNORMAL LOW (ref 8.9–10.3)
Chloride: 103 mmol/L (ref 101–111)
Creatinine, Ser: 1.17 mg/dL (ref 0.61–1.24)
GFR calc non Af Amer: >60 mL/min (ref >60)
GFR, EST NON AFRICAN AMERICAN: 59 mL/min — AB (ref >60)
GLUCOSE: 173 mg/dL — AB (ref 65–99)
Glucose, Bld: 121 mg/dL — ABNORMAL HIGH (ref 65–99)
PHOSPHORUS: 2.4 mg/dL — AB (ref 2.5–4.6)
PHOSPHORUS: 2.6 mg/dL (ref 2.5–4.6)
POTASSIUM: 4.6 mmol/L (ref 3.5–5.1)
POTASSIUM: 5 mmol/L (ref 3.5–5.1)
SODIUM: 136 mmol/L (ref 135–145)
Sodium: 136 mmol/L (ref 135–145)

## 2015-06-30 LAB — GLUCOSE, CAPILLARY
GLUCOSE-CAPILLARY: 90 mg/dL (ref 65–99)
GLUCOSE-CAPILLARY: 170 mg/dL — AB (ref 65–99)
Glucose-Capillary: 170 mg/dL — ABNORMAL HIGH (ref 65–99)
Glucose-Capillary: 201 mg/dL — ABNORMAL HIGH (ref 65–99)
Glucose-Capillary: 166 mg/dL — ABNORMAL HIGH (ref 65–99)
Glucose-Capillary: 252 mg/dL — ABNORMAL HIGH (ref 65–99)

## 2015-06-30 LAB — CBC
HCT: 29.3 % — ABNORMAL LOW (ref 39.0–52.0)
HEMOGLOBIN: 8.4 g/dL — AB (ref 13.0–17.0)
MCH: 27.7 pg (ref 26.0–34.0)
MCHC: 28.7 g/dL — AB (ref 30.0–36.0)
MCV: 96.7 fL (ref 78.0–100.0)
PLATELETS: 172 10*3/uL (ref 150–400)
RBC: 3.03 MIL/uL — AB (ref 4.22–5.81)
RDW: 21 % — ABNORMAL HIGH (ref 11.5–15.5)
WBC: 15.1 10*3/uL — ABNORMAL HIGH (ref 4.0–10.5)

## 2015-06-30 LAB — MAGNESIUM
MAGNESIUM: 2.5 mg/dL — AB (ref 1.7–2.4)
Magnesium: 2.5 mg/dL — ABNORMAL HIGH (ref 1.7–2.4)

## 2015-06-30 LAB — CORTISOL: CORTISOL PLASMA: 22.3 ug/dL

## 2015-06-30 LAB — HEPARIN LEVEL (UNFRACTIONATED)
HEPARIN UNFRACTIONATED: 0.55 [IU]/mL (ref 0.30–0.70)
Heparin Unfractionated: 0.55 IU/mL (ref 0.30–0.70)

## 2015-06-30 MED ORDER — HYDROCORTISONE NA SUCCINATE PF 100 MG IJ SOLR
50.0000 mg | Freq: Once | INTRAMUSCULAR | Status: AC
  Administered 2015-06-30: 50 mg via INTRAVENOUS
  Filled 2015-06-30: qty 2

## 2015-06-30 NOTE — Progress Notes (Signed)
ANTICOAGULATION CONSULT NOTE - Follow-up Consult  Pharmacy Consult for heparin Indication: DVT  No Known Allergies  Patient Measurements: Height: 5\' 9"  (175.3 cm) Weight: 245 lb 2.4 oz (111.2 kg) IBW/kg (Calculated) : 70.7 Heparin Dosing Weight: 97.5 kg  Vital Signs: Temp: 98.9 F (37.2 C) (05/25 0335) Temp Source: Oral (05/25 0335) BP: 93/64 mmHg (05/25 0600) Pulse Rate: 75 (05/25 0600)  Labs:  Recent Labs  06/28/15 0445  06/29/15 0418 06/29/15 1250 06/29/15 1620 06/30/15 0422  HGB 7.9*  --  8.2*  --   --  8.4*  HCT 27.7*  --  28.5*  --   --  29.3*  PLT 203  --  183  --   --  172  HEPARINUNFRC 0.65  --  0.81* 0.59  --  0.55  CREATININE 1.22  < > 1.13  --  1.11 1.19  < > = values in this interval not displayed.  Estimated Creatinine Clearance: 68 mL/min (by C-G formula based on Cr of 1.19).  Medications: Heparin @ 1100 units/hr   Assessment: 75 y.o. male admitted on 05/31/2015 with sepsis. Transferred to ICU s/p cardiac arrest with hemorrhagic and septic shock on 5/3  Anticoagulation: Coumadin PTA for AFib + hx DVT/PE. Coumadin on hold.  5/10 HIT panel neg, SRA neg 5/11 Dopplers: + acute DVT in R leg On Heparin for acute DVT at 1100 units/hr with therapeutic level of 0.55  Nephrology: One kidney, due to renal embolization + previous nephrectomy. On CRRT since 5/3. Minimal UOP >> unlikely renal recovery per Nephro.   Hematology / Oncology: H&H 8.4/29.3, Plts 172- watch  Goal of Therapy:  Heparin level 0.3-0.7 units/mL  Monitor platelets by anticoagulation protocol: Yes   Plan:  Continue heparin at 1100 units/hr Daily heparin level, CBC F/U trach plans and holding of heparin  Sloan Leiter, PharmD, BCPS Clinical Pharmacist [phone]  06/30/2015 7:21 AM

## 2015-06-30 NOTE — Progress Notes (Signed)
PULMONARY / CRITICAL CARE MEDICINE   Name: [name] MRN: [id number] DOB: Oct 22, 1940    ADMISSION DATE:  05/31/2015  REFERRING MD:  EDP  CHIEF COMPLAINT:  Hypotension   SUBJECTIVE: Patient decompensated yesterday with turning & bathing requiring full ventilator support and increased vasopressors. Prior to that his work of breathing increased and his pressure support on the vent was increased.  REVIEW OF SYSTEMS: Unobtainable as the patient is currently intubated.  VITAL SIGNS: BP 93/64 mmHg  Pulse 75  Temp(Src) 98.9 F (37.2 C) (Oral)  Resp 21  Ht 5\' 9"  (1.753 m)  Wt 245 lb 2.4 oz (111.2 kg)  BMI 36.19 kg/m2  SpO2 100%  INTAKE / OUTPUT: I/O last 3 completed shifts: In: 3837.6 [I.V.:1396.3; NG/GT:2441.3] Out: 4042 [Other:4042]  PHYSICAL EXAMINATION: General:  Watching TV. No acute distress. Wife at bedside.  Integument:  Warm & dry. No rash on exposed skin. Slightly worsened ischemic changes left foot. HEENT:  No scleral icterus. Endotracheal tube in place. PERRL. Cardiovascular:  Regular rate with irregular rhythm in atrial fibrillation on telemetry. No edema. No appreciable JVD.  Pulmonary:  Clear bilaterally to auscultation. Symmetric chest rise on ventilator. Abdomen: Soft. Normal bowel sounds. Nondistended. Nontender. Neurological:  Moving all 4 extremities equally. Follows commands. Nods to questions.  LABS:  BMET  Recent Labs Lab 06/29/15 0418 06/29/15 1620 06/30/15 0422  NA 136 137 136  K 5.4* 4.7 4.6  CL 101 102 102  CO2 27 28 26   BUN 31* 27* 29*  CREATININE 1.13 1.11 1.19  GLUCOSE 209* 183* 121*    Electrolytes  Recent Labs Lab 06/27/15 0500  06/28/15 0445  06/29/15 0418 06/29/15 1620 06/30/15 0422  CALCIUM 8.8*  < > 8.8*  < > 8.6* 8.8* 8.8*  MG 2.5*  --  2.5*  --  2.5*  --   --   PHOS 2.3*  < > 2.4*  < > 2.3* 2.2* 2.4*  < > = values in this interval not displayed.  CBC  Recent Labs Lab 06/28/15 0445 06/29/15 0418  06/30/15 0422  WBC 15.5* 14.2* 15.1*  HGB 7.9* 8.2* 8.4*  HCT 27.7* 28.5* 29.3*  PLT 203 183 172    Coag's No results for input(s): APTT, INR in the last 168 hours.  Sepsis Markers No results for input(s): LATICACIDVEN, PROCALCITON, O2SATVEN in the last 168 hours.  ABG No results for input(s): PHART, PCO2ART, PO2ART in the last 168 hours.  Liver Enzymes  Recent Labs Lab 06/29/15 0418 06/29/15 1620 06/30/15 0422  ALBUMIN 2.3* 2.4* 2.4*    Cardiac Enzymes No results for input(s): TROPONINI, PROBNP in the last 168 hours.  Glucose  Recent Labs Lab 06/29/15 0427 06/29/15 1110 06/29/15 1507 06/29/15 1940 06/29/15 2327 06/30/15 0337  GLUCAP 190* 188* 137* 189* 170* 90    Imaging No results found.   STUDIES:  4/26 Renal u/s >> s/p Lt nephrectomy 4/27 CT abd/pelvis >> Rt perinephric hematoma 5/02 CT abd/pelvis >> increased size of hematoma 5/08 Echo >> EF 65 to 70% 5/18 Doppler legs b/l >> Acute DVT Rt gastrocnemius vein, age indeterminate DVT Rt femoral vein and popliteal vein 5/22 Port CXR: Support lines and tubes remain in position. Low lung volumes. Partial silhouetting of left hemidiaphragm without obvious opacity. 5/23 TTE:  Moderate LVH. EF 65-70%. No wall motion abnormalities. LA mildly dilated. No AS or AR. No MR. RV normal in size & function. No effusion.  CULTURES: 4/25 Blood >> Klebsiella pneumoniae 4/27 Blood >> negative 5/16  BAL >> oral flora  ANTIBIOTICS: 4/28 Rocephin >> 5/10  SIGNIFICANT EVENTS: 4/25 Admitted with septic shock due to UTI. Significant ARF. 4/30 transferred out of ICU. 5/03 Cardiac arrest, anemia >> coil embolization Rt kidney by IR; PTX post-CPR 5/16 Reintubated due to airway secretions 5/18 start heparin gtt  LINES/TUBES: Rt IJ HD cath 4/26 - 5/12 ETT 5/3 - 5/13; 5/16 >> Lt femoral CVL 5/3 - 5/17 Lt chest tube 5/3 - 5/14 Rt chest tube 5/3 - 5/14 Rt IJ tunneled HD cath 5/12  >>  Lt [place] CVL 5/17 >>  NGT 5/8 >> PIV  x1  ASSESSMENT / PLAN:  PULMONARY A: Acute Hypoxic Respiratory Failure - Post Cardiac Arrest. Pneumothorax after CPR 5/03 - chest tubes removed 5/14. H/O OSA. Re-intubated 5/16 due to respiratory secretions.  P:  Continuing Pressure Support Wean Plan for tracheostomy by ENT  CARDIOVASCULAR A: S/P Cardiac Arrest Shock - Hemorrhagic secondary to Rt perinephric hematoma. Questionable cuff pressure readings. Right Gastocnemious vein acute DVT Intermediate age DVT Right femoral & popliteal veins Ischemic Changes Left Foot - Slightly worse.  P:  Levophed to keep MAP > 65 Hold outpt coreg, tricor, lasix, niacin, accupril Continue heparin gtt  Holding on Coumadin at this time Midodrine 10mg  tid Checking Arterial Dopplar LLE Also checking CVP  RENAL A:  Acute on Chronic Renal Failure  Hyperkalemia - Mild. Hypophosphatemia - Mild.  H/O Left Nephrectomy for RCC  P:  Nephrology following Continuing CRRT Monitoring electrolytes per nephrology  GASTROINTESTINAL A:  H/O Dysphagia Constipation - Resolved.  P:  Continuing Tube Feedings Protonix VT qday  HEMATOLOGIC / ONCOLOGIC A: Anemia - Secondary to acute blood loss from Rt perinephric hematoma. Hgb Stable. Leukocytosis - Unclear etiology. Stable. H/O Left RCC - S/P nephrectomy remotely. H/O Anemia  P:  Trending Cell Counts daily w/ CBC Heparin gtt  INFECTIOUS A:  Sepsis Klebsiella Bacteremia - Completed Antibiotic course 5/10.  P:  No antibiotics currently Trending fever curve & leukocytosis Plan to re-culture for fever  ENDOCRINE A:  DM - BG controlled.  P:  AccuChecks q4hr Moderate Dose SSI per algorithm  NPH to 25u [place] q12hr  Checking serum Cortisol  NEUROLOGIC A:  Acute Encephalopathy - Possibly secondary to metabolic encephalopathy & shock. Physical Deconditioning  P:  RASS goal: 0 to -1 Fentanyl gtt & IV prn Versed IV prn  FAMILY UPDATE: Wife updated at bedside  on 5/25 by Dr. Ashok Cordia.  TODAY'S SUMMARY:  [name] w/ h/o Lt renal cell carcinoma s/p Lt nephrectomy, prostate cancer s/p XRT, PE in 2008, OSA, HTN, HLD, DM, A fib. Now with hemorrhagic shock leading to cardiac arrest, VDRF, AKI from Rt perinephric hematoma. Plan for tracheostomy this week with ENT with patient's extremely limited reserve. Still unclear etiology of hypotension but I suspect related to sedation & ongoing CVVHD.  I have spent a total of 31 minutes of critical care time today caring for the patient, updating his wife at bedside, & reviewing his electronic medical record.  Sonia Baller Ashok Cordia, M.D. [facility] Pulmonary & Critical Care Pager:  [phone] After 3pm or if no response, call [phone]  06/30/2015, 6:49 AM

## 2015-06-30 NOTE — Progress Notes (Addendum)
VASCULAR LAB PRELIMINARY  PRELIMINARY  PRELIMINARY  PRELIMINARY  Left lower extremity arterial duplex completed.     No significant elevation of the peak systolic velocity is seen in the left lower extremity.  Difficult in the left groin due to bandage. Waveforms in the common femoral indicate monophasic waveform this could be a falsely indicated due to difficult bandage in this area.   Biphasic waveforms in the profunda artery.   Triphasic throughout the left lower extremity.   Suggestion of no significant stenosis noted in the left lower extremity.  Milynn Quirion, RVT, RDMS 06/30/2015, 3:06 PM

## 2015-06-30 NOTE — Progress Notes (Signed)
Patient ID: [name], male   DOB: [birth year], 75 y.o.   MRN: [id number] [facility] Progress Note   Assessment/ Plan:   1. 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 Remains pressor dependent with efforts to wean off-continue CRRT at current prescription (keeping fluid even). 2. Healthcare associated pneumonia: With VDRF, he has completed the duration of antibiotic therapy. On midodrine to allow for weaning off Levophed. 3. Atrial fibrillation: Paroxysmal and currently appears to be in sinus rhythm 4. Anemia with recent hemorrhagic shock/acute blood loss anemia from right perinephric hematoma. Low iron saturation but high ferritin prohibitive to intravenous iron therapy 5. Hypophosphatemia: Secondary to CRRT losses, monitor with replacement via intravenous route per protocol  Subjective:   No acute events overnight -He indicates he is comfortable.    Objective:   BP 90/69 mmHg  Pulse 79  Temp(Src) 98.9 F (37.2 C) (Oral)  Resp 21  Ht _0  (1.753 m)  Wt 111.2 kg (245 lb 2.4 oz)  BMI 36.19 kg/m2  SpO2 100%  Intake/Output Summary (Last 24 hours) at 06/30/15 5465 Last data filed at 06/30/15 0700  Gross per 24 hour  Intake 2203.83 ml  Output   2246 ml  Net -42.17 ml   Weight change: 1 kg (2 lb 3.3 oz)  Physical Exam: Gen: Appears to be comfortable on ventilator/CRRT-opens eyes and nods to conversation CVS: Pulse regular rhythm, S1 and S2 normal Resp: Coarse breath sounds bilaterally, no rales Abd: Soft, obese, nontender Ext: Trace lower extremity edema  Imaging: No results found.  Labs: BMET  Recent Labs Lab 06/27/15 0500 06/27/15 1600 06/28/15 0445 06/28/15 1558 06/29/15 0418 06/29/15 1620  06/30/15 0422  NA 136 136 136 137 136 137 136  K 4.7 4.6 4.7 4.7 5.4* 4.7 4.6  CL 101 102 101 102 101 102 102  CO2 _1 GLUCOSE 237* 182* 230* 223* 209* 183* 121*  BUN 36* 31* 36* 33* 31* 27* 29*  CREATININE 1.26* 1.19 1.22 1.16 1.13 1.11 1.19  CALCIUM 8.8* 8.8* 8.8* 8.7* 8.6* 8.8* 8.8*  PHOS 2.3* 2.0* 2.4* 2.2* 2.3* 2.2* 2.4*   CBC  Recent Labs Lab 06/27/15 0500 06/28/15 0445 06/29/15 0418 06/30/15 0422  WBC 19.4* 15.5* 14.2* 15.1*  HGB 7.9* 7.9* 8.2* 8.4*  HCT 27.0* 27.7* 28.5* 29.3*  MCV 96.8 95.8 96.9 96.7  PLT 201 203 183 172    Medications:    . antiseptic oral rinse  7 mL Mouth Rinse QID  . chlorhexidine gluconate (SAGE KIT)  15 mL Mouth Rinse BID  . feeding supplement (PRO-STAT SUGAR FREE 64)  30 mL Per Tube QID  . insulin aspart  0-15 Units Subcutaneous Q4H  . insulin NPH Human  25 Units Subcutaneous Q12H  . midodrine  10 mg Oral TID AC  . pantoprazole sodium  40 mg Per Tube Q1200  . sodium chloride flush  10-40 mL Intracatheter Q12H   Elmarie Shiley, MD 06/30/2015, 8:03 AM

## 2015-07-01 LAB — RENAL FUNCTION PANEL
ALBUMIN: 2.3 g/dL — AB (ref 3.5–5.0)
ALBUMIN: 2.3 g/dL — AB (ref 3.5–5.0)
Anion gap: 8 (ref 5–15)
Anion gap: 5 (ref 5–15)
BUN: 31 mg/dL — AB (ref 6–20)
BUN: 26 mg/dL — AB (ref 6–20)
CALCIUM: 8.3 mg/dL — AB (ref 8.9–10.3)
CHLORIDE: 103 mmol/L (ref 101–111)
CO2: 27 mmol/L (ref 22–32)
CO2: 27 mmol/L (ref 22–32)
CREATININE: 1.03 mg/dL (ref 0.61–1.24)
Calcium: 8.5 mg/dL — ABNORMAL LOW (ref 8.9–10.3)
Chloride: 105 mmol/L (ref 101–111)
Creatinine, Ser: 1.19 mg/dL (ref 0.61–1.24)
GFR calc non Af Amer: >60 mL/min (ref >60)
GFR, EST NON AFRICAN AMERICAN: 59 mL/min — AB (ref >60)
Glucose, Bld: 176 mg/dL — ABNORMAL HIGH (ref 65–99)
Glucose, Bld: 136 mg/dL — ABNORMAL HIGH (ref 65–99)
PHOSPHORUS: 2.6 mg/dL (ref 2.5–4.6)
PHOSPHORUS: 2.1 mg/dL — AB (ref 2.5–4.6)
POTASSIUM: 4.5 mmol/L (ref 3.5–5.1)
Potassium: 4.7 mmol/L (ref 3.5–5.1)
SODIUM: 137 mmol/L (ref 135–145)
Sodium: 138 mmol/L (ref 135–145)

## 2015-07-01 LAB — GLUCOSE, CAPILLARY
GLUCOSE-CAPILLARY: 170 mg/dL — AB (ref 65–99)
GLUCOSE-CAPILLARY: 151 mg/dL — AB (ref 65–99)
GLUCOSE-CAPILLARY: 136 mg/dL — AB (ref 65–99)
Glucose-Capillary: 218 mg/dL — ABNORMAL HIGH (ref 65–99)
Glucose-Capillary: 189 mg/dL — ABNORMAL HIGH (ref 65–99)
Glucose-Capillary: 122 mg/dL — ABNORMAL HIGH (ref 65–99)
Glucose-Capillary: 151 mg/dL — ABNORMAL HIGH (ref 65–99)

## 2015-07-01 LAB — CBC
HCT: 28.5 % — ABNORMAL LOW (ref 39.0–52.0)
HEMOGLOBIN: 8.1 g/dL — AB (ref 13.0–17.0)
MCH: 27.5 pg (ref 26.0–34.0)
MCHC: 28.4 g/dL — AB (ref 30.0–36.0)
MCV: 96.6 fL (ref 78.0–100.0)
PLATELETS: 161 10*3/uL (ref 150–400)
RBC: 2.95 MIL/uL — AB (ref 4.22–5.81)
RDW: 20.7 % — ABNORMAL HIGH (ref 11.5–15.5)
WBC: 10.6 10*3/uL — ABNORMAL HIGH (ref 4.0–10.5)

## 2015-07-01 LAB — MAGNESIUM: MAGNESIUM: 2.4 mg/dL (ref 1.7–2.4)

## 2015-07-01 LAB — HEPARIN LEVEL (UNFRACTIONATED): HEPARIN UNFRACTIONATED: 0.51 [IU]/mL (ref 0.30–0.70)

## 2015-07-01 NOTE — Progress Notes (Signed)
ANTICOAGULATION CONSULT NOTE - Follow-up Consult  Pharmacy Consult for heparin Indication: DVT  No Known Allergies  Patient Measurements: Height: 5\' 9"  (175.3 cm) Weight: 236 lb 15.9 oz (107.5 kg) IBW/kg (Calculated) : 70.7 Heparin Dosing Weight: 97.5 kg  Vital Signs: Temp: 97.5 F (36.4 C) (05/26 1154) Temp Source: Oral (05/26 1154) BP: 131/88 mmHg (05/26 1136) Pulse Rate: 105 (05/26 1136)  Labs:  Recent Labs  06/29/15 0418  06/30/15 0422 06/30/15 1554 06/30/15 1555 07/01/15 0327  HGB 8.2*  --  8.4*  --   --  8.1*  HCT 28.5*  --  29.3*  --   --  28.5*  PLT 183  --  172  --   --  161  HEPARINUNFRC 0.81*  < > 0.55  --  0.55 0.51  CREATININE 1.13  < > 1.19 1.17  --  1.19  < > = values in this interval not displayed.  Estimated Creatinine Clearance: 66.8 mL/min (by C-G formula based on Cr of 1.19).  Medications: Heparin @ 1150 units/hr   Assessment: 75 y.o. male admitted on 05/31/2015 with sepsis. Transferred to ICU s/p cardiac arrest with hemorrhagic and septic shock on 5/3  PMH: prostate ca s/p radiation 2011, renal cell ca s/p left nephrectomy, afib/hx of dvt on warfarin  Anticoagulation: Coumadin PTA for AFib + hx DVT/PE.  5/10 HIT panel neg, SRA neg 5/11 Dopplers: + acute DVT in R leg On Heparin for acute DVT at 1100 units/hr with level of 0.51  Nephrology: One kidney, due to renal embolization + previous nephrectomy. On CRRT since 5/3. Minimal UOP >> unlikely renal recovery per Nephro. Tunneled cath placed 5/12, but have not started HD  Hematology / Oncology: H&H 8.1/28.5, Plt 161  Goal of Therapy:  Heparin level 0.3-0.7 units/mL  Monitor platelets by anticoagulation protocol: Yes   Plan:  - Continue heparin 1100 units/hr - Daily heparin level, CBC - monitor S/sx of bleeding, CBGs - Trach probably Mon now since hep not held  Levester Fresh, PharmD, BCPS, [facility] Clinical Pharmacist Pager [phone] 07/01/2015 12:11 PM

## 2015-07-01 NOTE — Progress Notes (Signed)
Patient ID: [name], male   DOB: 05/25/1940, 75 y.o.   MRN: [id number] [facility] Progress Note   Assessment/ Plan:   1. 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 Remains pressor dependent attempting to wean off-continue CRRT at current prescription (keeping fluid even). 2. VDRF: s/p HCAP, he has completed the duration of antibiotic therapy. On midodrine to allow for weaning off Levophed. Tracheostomy in OR today 3. Atrial fibrillation: Paroxysmal and currently appears to be in sinus rhythm 4. Anemia with recent hemorrhagic shock/acute blood loss anemia from right perinephric hematoma. Low Tsat but high ferritin prohibitive to intravenous iron therapy 5. Hypophosphatemia: Secondary to CRRT losses, replaced via intravenous route per protocol  Subjective:   No acute events overnight -he indicates he is comfortable. Tracheostomy today    Objective:   BP 93/62 mmHg  Pulse 81  Temp(Src) 97.7 F (36.5 C) (Oral)  Resp 22  Ht '5\' 9"'  (1.753 m)  Wt 107.5 kg (236 lb 15.9 oz)  BMI 34.98 kg/m2  SpO2 100%  Intake/Output Summary (Last 24 hours) at 07/01/15 0816 Last data filed at 07/01/15 0700  Gross per 24 hour  Intake 2005.96 ml  Output   2152 ml  Net -146.04 ml   Weight change: -3.7 kg (-8 lb 2.5 oz)  Physical Exam: Gen: Awake and appears to be comfortable on ventilator/CRRT CVS: Pulse regular rhythm, S1 and S2 normal Resp: Coarse breath sounds bilaterally, no rales Abd: Soft, obese, nontender Ext: Trace lower extremity edema  Imaging: No results found.  Labs: BMET  Recent Labs Lab 06/28/15 0445 06/28/15 1558 06/29/15 0418 06/29/15 1620 06/30/15 0422 06/30/15 1554 07/01/15 0327  NA 136 137 136 137  136 136 138  K 4.7 4.7 5.4* 4.7 4.6 5.0 4.5  CL 101 102 101 102 102 103 103  CO2 '26 27 27 28 26 26 27  ' GLUCOSE 230* 223* 209* 183* 121* 173* 176*  BUN 36* 33* 31* 27* 29* 27* 31*  CREATININE 1.22 1.16 1.13 1.11 1.19 1.17 1.19  CALCIUM 8.8* 8.7* 8.6* 8.8* 8.8* 8.5* 8.5*  PHOS 2.4* 2.2* 2.3* 2.2* 2.4* 2.6 2.6   CBC  Recent Labs Lab 06/28/15 0445 06/29/15 0418 06/30/15 0422 07/01/15 0327  WBC 15.5* 14.2* 15.1* 10.6*  HGB 7.9* 8.2* 8.4* 8.1*  HCT 27.7* 28.5* 29.3* 28.5*  MCV 95.8 96.9 96.7 96.6  PLT 203 183 172 161    Medications:    . antiseptic oral rinse  7 mL Mouth Rinse QID  . chlorhexidine gluconate (SAGE KIT)  15 mL Mouth Rinse BID  . feeding supplement (PRO-STAT SUGAR FREE 64)  30 mL Per Tube QID  . insulin aspart  0-15 Units Subcutaneous Q4H  . insulin NPH Human  25 Units Subcutaneous Q12H  . midodrine  10 mg Oral TID AC  . pantoprazole sodium  40 mg Per Tube Q1200  . sodium chloride flush  10-40 mL Intracatheter Q12H   Elmarie Shiley, MD 07/01/2015, 8:16 AM

## 2015-07-01 NOTE — Progress Notes (Signed)
PULMONARY / CRITICAL CARE MEDICINE   Name: [name] MRN: [id number] DOB: 03-May-1940    ADMISSION DATE:  05/31/2015  REFERRING MD:  EDP  CHIEF COMPLAINT:  Hypotension   SUBJECTIVE: No acute events overnight. Patient transitioned to chair from bed this morning.  REVIEW OF SYSTEMS: Unobtainable as the patient is currently intubated.  VITAL SIGNS: BP 91/64 mmHg  Pulse 71  Temp(Src) 97.7 F (36.5 C) (Oral)  Resp 19  Ht 5\' 9"  (1.753 m)  Wt 236 lb 15.9 oz (107.5 kg)  BMI 34.98 kg/m2  SpO2 100%  INTAKE / OUTPUT: I/O last 3 completed shifts: In: 3162 [I.V.:1482; NG/GT:1680] Out: 3333 [Other:3333]  PHYSICAL EXAMINATION: General:  Watching TV. Sitting up in a chair on ventilator. Wife at bedside.  Integument:  Warm & dry. No rash on exposed skin. Bluish discoloration on left foot unchanged. HEENT:  No scleral icterus or injection. Endotracheal tube in place. PERRL. Cardiovascular:  Regular rate with irregular rhythm in atrial fibrillation on telemetry. No edema. No appreciable JVD in sitting position.  Pulmonary:  Clear bilaterally to auscultation. Symmetric chest expansion on ventilator in chair. Abdomen: Soft. Normal bowel sounds. Nondistended.  Neurological:  Moving all 4 extremities equally. Hand grip symmetric. Follows commands. Moving all four limbs equally.  LABS:  BMET  Recent Labs Lab 06/30/15 0422 06/30/15 1554 07/01/15 0327  NA 136 136 138  K 4.6 5.0 4.5  CL 102 103 103  CO2 26 26 27   BUN 29* 27* 31*  CREATININE 1.19 1.17 1.19  GLUCOSE 121* 173* 176*    Electrolytes  Recent Labs Lab 06/30/15 0422 06/30/15 1554 07/01/15 0327  CALCIUM 8.8* 8.5* 8.5*  MG 2.5* 2.5* 2.4  PHOS 2.4* 2.6 2.6    CBC  Recent Labs Lab 06/29/15 0418 06/30/15 0422 07/01/15 0327  WBC 14.2* 15.1* 10.6*  HGB 8.2* 8.4* 8.1*  HCT 28.5* 29.3* 28.5*  PLT 183 172 161    Coag's No results for input(s): APTT, INR in the last 168 hours.  Sepsis Markers No results  for input(s): LATICACIDVEN, PROCALCITON, O2SATVEN in the last 168 hours.  ABG No results for input(s): PHART, PCO2ART, PO2ART in the last 168 hours.  Liver Enzymes  Recent Labs Lab 06/30/15 0422 06/30/15 1554 07/01/15 0327  ALBUMIN 2.4* 2.3* 2.3*    Cardiac Enzymes No results for input(s): TROPONINI, PROBNP in the last 168 hours.  Glucose  Recent Labs Lab 06/30/15 0337 06/30/15 0810 06/30/15 1126 06/30/15 1534 06/30/15 1942 07/01/15 0326  GLUCAP 90 170* 201* 166* 252* 170*    Imaging No results found.   STUDIES:  4/26 Renal u/s >> s/p Lt nephrectomy 4/27 CT abd/pelvis >> Rt perinephric hematoma 5/02 CT abd/pelvis >> increased size of hematoma 5/08 Echo >> EF 65 to 70% 5/18 Doppler legs b/l >> Acute DVT Rt gastrocnemius vein, age indeterminate DVT Rt femoral vein and popliteal vein 5/22 Port CXR: Support lines and tubes remain in position. Low lung volumes. Partial silhouetting of left hemidiaphragm without obvious opacity. 5/23 TTE:  Moderate LVH. EF 65-70%. No wall motion abnormalities. LA mildly dilated. No AS or AR. No MR. RV normal in size & function. No effusion. 5/25 LLE Arterial Duplex:  Prelim report. No evidence of stenosis or occluded arteries. Triphasic blood flow throughout left leg.  CULTURES: 4/25 Blood >> Klebsiella pneumoniae 4/27 Blood >> negative 5/16 BAL >> oral flora  ANTIBIOTICS: 4/28 Rocephin >> 5/10  SIGNIFICANT EVENTS: 4/25 Admitted with septic shock due to UTI. Significant ARF. 4/30 transferred  out of ICU. 5/03 Cardiac arrest, anemia >> coil embolization Rt kidney by IR; PTX post-CPR 5/16 Reintubated due to airway secretions 5/18 start heparin gtt  LINES/TUBES: Rt IJ HD cath 4/26 - 5/12 ETT 5/3 - 5/13; 5/16 >> Lt femoral CVL 5/3 - 5/17 Lt chest tube 5/3 - 5/14 Rt chest tube 5/3 - 5/14 Rt IJ tunneled HD cath 5/12  >>  Lt [place] CVL 5/17 >>  NGT 5/8 >> PIV x1  ASSESSMENT / PLAN:  PULMONARY A: Acute Hypoxic Respiratory  Failure - Post Cardiac Arrest. Pneumothorax after CPR 5/03 - chest tubes removed 5/14. H/O OSA. Re-intubated 5/16 due to respiratory secretions.  P:  Continuing Pressure Support Wean Plan for tracheostomy by ENT  CARDIOVASCULAR A: S/P Cardiac Arrest Shock - Hemorrhagic secondary to Rt perinephric hematoma. Questionable cuff pressure readings. No effect w/ trial of Solu-Cortef. Right Gastocnemious vein acute DVT Intermediate age DVT Right femoral & popliteal veins Ischemic Changes Left Foot - Slightly worse. Arterial duplex w/o notable findings.  P:  Levophed to keep MAP > 65 Hold outpt coreg, tricor, lasix, niacin, accupril Continue heparin gtt  Holding on Coumadin at this time Midodrine 10mg  tid  RENAL A:  Acute on Chronic Renal Failure  Hyperkalemia - Mild. Hypophosphatemia - Mild.  H/O Left Nephrectomy for RCC  P:  Nephrology following Continuing CRRT Monitoring electrolytes per nephrology  GASTROINTESTINAL A:  H/O Dysphagia Constipation - Resolved.  P:  Continuing Tube Feedings Protonix VT qday  HEMATOLOGIC / ONCOLOGIC A: Anemia - Secondary to acute blood loss from Rt perinephric hematoma. Hgb Stable. Leukocytosis - Unclear etiology. Improving. H/O Left RCC - S/P nephrectomy remotely. H/O Anemia  P:  Trending Cell Counts daily w/ CBC Heparin gtt  INFECTIOUS A:  Sepsis Klebsiella Bacteremia - Completed Antibiotic course 5/10.  P:  No antibiotics currently Trending fever curve & leukocytosis Plan to re-culture for fever  ENDOCRINE A:  DM - BG controlled.  P:  AccuChecks q4hr Moderate Dose SSI per algorithm  NPH to 25u [place] q12hr  Checking serum Cortisol  NEUROLOGIC A:  Acute Encephalopathy - Possibly secondary to metabolic encephalopathy & shock. Physical Deconditioning  P:  RASS goal: 0 to -1 Fentanyl gtt & IV prn Versed IV prn Minimizing Fentanyl gtt to help with hypotension  FAMILY UPDATE: Wife updated at  bedside on 5/26 by Dr. Ashok Cordia.  TODAY'S SUMMARY:  [name] w/ h/o Lt renal cell carcinoma s/p Lt nephrectomy, prostate cancer s/p XRT, PE in 2008, OSA, HTN, HLD, DM, A fib. Now with hemorrhagic shock leading to cardiac arrest, VDRF, AKI from Rt perinephric hematoma. Plan for tracheostomy by ENT with patient's extremely limited reserve. Weaning use of fentanyl infusion in an effort to improve hypotension.  I have spent a total of 34 minutes of critical care time today caring for the patient, updating his wife at bedside, & reviewing his electronic medical record.  Sonia Baller Ashok Cordia, M.D. [facility] Pulmonary & Critical Care Pager:  [phone] After 3pm or if no response, call [phone]  07/01/2015, 7:17 AM

## 2015-07-01 NOTE — Care Management Note (Signed)
Case Management Note  Patient Details  Name: [name] MRN: [id number] Date of Birth: [birth year]  Subjective/Objective:    Pt originally admitted with septic shock - then found to have perinephric hematoma                 Action/Plan:   07/01/2015  Pt reintubated overnight with continuous sedation and pressor.  Continuous CRRT  06/20/15 Pt remains on CRRT.  Pressor reinitiated over weekend.  CM will continue to follow for discharge needs  06/17/15 Pt remains ventilated on sedation with tube feeds.  Pt remains on CRRT - HD catheter placed in IR 06/17/15.  CM consulted with attending  regarding appropriateness for [facility] referral; pt does not appear to be appropriate for LTACH at this time - will revisit appropriateness next week   06/09/15: Pt is from home with wife - now intubated s/p IR procedure.  CM will continue to monitor for disposition needs   Expected Discharge Date:                  Expected Discharge Plan:  [place]  In-House Referral:  Clinical Social Work  Discharge planning Services  CM Consult  Post Acute Care Choice:    Choice offered to:     DME Arranged:    DME Agency:     HH Arranged:    [place] Agency:     Status of Service:  In process, will continue to follow  Medicare Important Message Given:    Date Medicare IM Given:    Medicare IM give by:    Date Additional Medicare IM Given:    Additional Medicare Important Message give by:     If discussed at [place] of Stay Meetings, dates discussed:    Additional Comments: 07/01/2015  Pt has plans for trach today in OR.  Pt remains on ventilator with CRRT - sedation and pressors.  06/23/15 CSW consulted for CIR back up plan Maryclare Labrador, RN 07/01/2015, 8:33 AM

## 2015-07-02 DIAGNOSIS — I4891 Unspecified atrial fibrillation: Secondary | ICD-10-CM

## 2015-07-02 LAB — RENAL FUNCTION PANEL
ALBUMIN: 2.2 g/dL — AB (ref 3.5–5.0)
Albumin: 2.2 g/dL — ABNORMAL LOW (ref 3.5–5.0)
Anion gap: 7 (ref 5–15)
Anion gap: 7 (ref 5–15)
BUN: 27 mg/dL — AB (ref 6–20)
BUN: 29 mg/dL — AB (ref 6–20)
CALCIUM: 8.6 mg/dL — AB (ref 8.9–10.3)
CALCIUM: 8.3 mg/dL — AB (ref 8.9–10.3)
CHLORIDE: 102 mmol/L (ref 101–111)
CO2: 28 mmol/L (ref 22–32)
CO2: 28 mmol/L (ref 22–32)
CREATININE: 1.06 mg/dL (ref 0.61–1.24)
Chloride: 99 mmol/L — ABNORMAL LOW (ref 101–111)
Creatinine, Ser: 1.23 mg/dL (ref 0.61–1.24)
GFR calc Af Amer: >60 mL/min (ref >60)
GFR calc Af Amer: >60 mL/min (ref >60)
GFR, EST NON AFRICAN AMERICAN: 56 mL/min — AB (ref >60)
Glucose, Bld: 155 mg/dL — ABNORMAL HIGH (ref 65–99)
Glucose, Bld: 262 mg/dL — ABNORMAL HIGH (ref 65–99)
PHOSPHORUS: 4.7 mg/dL — AB (ref 2.5–4.6)
POTASSIUM: 4.4 mmol/L (ref 3.5–5.1)
Phosphorus: 1.8 mg/dL — ABNORMAL LOW (ref 2.5–4.6)
Potassium: 4.5 mmol/L (ref 3.5–5.1)
SODIUM: 137 mmol/L (ref 135–145)
SODIUM: 134 mmol/L — AB (ref 135–145)

## 2015-07-02 LAB — CBC
HEMATOCRIT: 29.5 % — AB (ref 39.0–52.0)
HEMOGLOBIN: 8.4 g/dL — AB (ref 13.0–17.0)
MCH: 27.5 pg (ref 26.0–34.0)
MCHC: 28.5 g/dL — ABNORMAL LOW (ref 30.0–36.0)
MCV: 96.4 fL (ref 78.0–100.0)
Platelets: 205 10*3/uL (ref 150–400)
RBC: 3.06 MIL/uL — ABNORMAL LOW (ref 4.22–5.81)
RDW: 20.2 % — AB (ref 11.5–15.5)
WBC: 11.2 10*3/uL — AB (ref 4.0–10.5)

## 2015-07-02 LAB — MAGNESIUM: Magnesium: 2.4 mg/dL (ref 1.7–2.4)

## 2015-07-02 LAB — GLUCOSE, CAPILLARY
GLUCOSE-CAPILLARY: 256 mg/dL — AB (ref 65–99)
GLUCOSE-CAPILLARY: 63 mg/dL — AB (ref 65–99)
Glucose-Capillary: 149 mg/dL — ABNORMAL HIGH (ref 65–99)
Glucose-Capillary: 165 mg/dL — ABNORMAL HIGH (ref 65–99)
Glucose-Capillary: 185 mg/dL — ABNORMAL HIGH (ref 65–99)

## 2015-07-02 LAB — HEPARIN LEVEL (UNFRACTIONATED): HEPARIN UNFRACTIONATED: 0.44 [IU]/mL (ref 0.30–0.70)

## 2015-07-02 LAB — PROCALCITONIN: PROCALCITONIN: 2.01 ng/mL

## 2015-07-02 MED ORDER — SODIUM PHOSPHATES 45 MMOLE/15ML IV SOLN
30.0000 mmol | Freq: Once | INTRAVENOUS | Status: AC
  Administered 2015-07-02: 30 mmol via INTRAVENOUS
  Filled 2015-07-02: qty 10

## 2015-07-02 MED ORDER — DEXTROSE 50 % IV SOLN
INTRAVENOUS | Status: AC
  Administered 2015-07-02: 50 mL
  Filled 2015-07-02: qty 50

## 2015-07-02 MED ORDER — K PHOS MONO-SOD PHOS DI & MONO 155-852-130 MG PO TABS: 500.0000 mg | ORAL_TABLET | Freq: Two times a day (BID) | ORAL | Status: DC

## 2015-07-02 MED ORDER — K PHOS MONO-SOD PHOS DI & MONO 155-852-130 MG PO TABS
500.0000 mg | ORAL_TABLET | Freq: Two times a day (BID) | ORAL | Status: AC
  Administered 2015-07-02 (×2): 500 mg
  Filled 2015-07-02 (×2): qty 2

## 2015-07-02 MED ORDER — FENTANYL CITRATE (PF) 100 MCG/2ML IJ SOLN
25.0000 ug | INTRAMUSCULAR | Status: DC | PRN
  Administered 2015-07-05 – 2015-07-07 (×11): 50 ug via INTRAVENOUS
  Administered 2015-07-07: 25 ug via INTRAVENOUS
  Administered 2015-07-07: 50 ug via INTRAVENOUS
  Administered 2015-07-07: 25 ug via INTRAVENOUS
  Administered 2015-07-07 – 2015-07-08 (×3): 50 ug via INTRAVENOUS
  Administered 2015-07-08: 25 ug via INTRAVENOUS
  Administered 2015-07-08 – 2015-07-12 (×11): 50 ug via INTRAVENOUS
  Filled 2015-07-02 (×29): qty 2

## 2015-07-02 MED ORDER — INSULIN NPH (HUMAN) (ISOPHANE) 100 UNIT/ML ~~LOC~~ SUSP
10.0000 [IU] | Freq: Two times a day (BID) | SUBCUTANEOUS | Status: DC
  Administered 2015-07-03 – 2015-07-05 (×4): 10 [IU] via SUBCUTANEOUS

## 2015-07-02 MED ORDER — WHITE PETROLATUM GEL
Status: AC
Start: 2015-07-02 — End: 2015-07-02
  Administered 2015-07-02: 0.2
  Filled 2015-07-02: qty 1

## 2015-07-02 NOTE — Progress Notes (Signed)
Patient ID: [name], male   DOB: 04-11-1940, 75 y.o.   MRN: [id number] He was cancelled yesterday secondary to the heparin issue. I will reschedule but this may give him some more time to extubate. He is awake and sitting up with no secretions in the ETT tube. He is still weaning off vent.

## 2015-07-02 NOTE — Progress Notes (Signed)
Sputum specimen obtained and sent down to main lab without complications.  

## 2015-07-02 NOTE — Progress Notes (Signed)
PCCM Attending Note:  Patient with significant cuff leak from endotracheal tube. Appears comfortable in bed with stable respiratory effort. Extubated patient to nasal cannula with ease. Plan to use BiPAP prn for increased work of breathing with low threshold to re-intubate patient.   Sonia Baller Ashok Cordia, M.D. [facility] Pulmonary & Critical Care Pager:  [phone] After 3pm or if no response, call [phone] 2:57 PM 07/02/2015

## 2015-07-02 NOTE — Progress Notes (Signed)
PULMONARY / CRITICAL CARE MEDICINE   Name: ABDISHAKUR Villegas MRN: [id number] DOB: [birth year]    ADMISSION DATE:  05/31/2015  REFERRING MD:  EDP  CHIEF COMPLAINT:  Hypotension   SUBJECTIVE: No acute events overnight. Didn't tolerate stopping Fentanyl drip due to increased pain & agitation.  REVIEW OF SYSTEMS: Unobtainable as the patient is currently intubated.  VITAL SIGNS: BP 83/57 mmHg  Pulse 76  Temp(Src) 98 F (36.7 C) (Oral)  Resp 19  Ht 5\' 9"  (1.753 m)  Wt 228 lb 2.8 oz (103.5 kg)  BMI 33.68 kg/m2  SpO2 100%  INTAKE / OUTPUT: I/O last 3 completed shifts: In: 3271.8 [I.V.:1591.8; NG/GT:1680] Out: 3339 [id number]  PHYSICAL EXAMINATION: General:  Watching TV. Sitting up in a chair on ventilator. Wife at bedside.  Integument:  Warm & dry. No rash on exposed skin. Bluish discoloration on left foot unchanged. HEENT:  No scleral icterus. Endotracheal tube in place. PERRL. Moist membranes. Cardiovascular:  Regular rate with irregular rhythm in atrial fibrillation on telemetry. No edema. No appreciable JVD in sitting position.  Pulmonary:  Clear bilaterally to auscultation. Symmetric chest expansion on ventilator in chair. Tolerating PS wean. Abdomen: Soft. Normal bowel sounds. Nondistended. Nontender Neurological:  Moving all 4 extremities equally. Hand grip symmetric. Follows commands. Moving all four limbs equally.  LABS:  BMET  Recent Labs Lab 07/01/15 0327 07/01/15 1600 07/02/15 0345  NA 138 137 137  K 4.5 4.7 4.5  CL 103 105 102  CO2 27 27 28   BUN 31* 26* 27*  CREATININE 1.19 1.03 1.06  GLUCOSE 176* 136* 155*    Electrolytes  Recent Labs Lab 06/30/15 1554 07/01/15 0327 07/01/15 1600 07/02/15 0345  CALCIUM 8.5* 8.5* 8.3* 8.6*  MG 2.5* 2.4  --  2.4  PHOS 2.6 2.6 2.1* 1.8*    CBC  Recent Labs Lab 06/30/15 0422 07/01/15 0327 07/02/15 0345  WBC 15.1* 10.6* 11.2*  HGB 8.4* 8.1* 8.4*  HCT 29.3* 28.5* 29.5*  PLT 172 161 205    Coag's No  results for input(s): APTT, INR in the last 168 hours.  Sepsis Markers No results for input(s): LATICACIDVEN, PROCALCITON, O2SATVEN in the last 168 hours.  ABG No results for input(s): PHART, PCO2ART, PO2ART in the last 168 hours.  Liver Enzymes  Recent Labs Lab 07/01/15 0327 07/01/15 1600 07/02/15 0345  ALBUMIN 2.3* 2.3* 2.2*    Cardiac Enzymes No results for input(s): TROPONINI, PROBNP in the last 168 hours.  Glucose  Recent Labs Lab 07/01/15 0326 07/01/15 0758 07/01/15 1152 07/01/15 1559 07/01/15 1934 07/01/15 2316  GLUCAP 170* 151* 189* 122* 151* 136*    Imaging No results found.   STUDIES:  4/26 Renal u/s >> s/p Lt nephrectomy 4/27 CT abd/pelvis >> Rt perinephric hematoma 5/02 CT abd/pelvis >> increased size of hematoma 5/08 Echo >> EF 65 to 70% 5/18 Doppler legs b/l >> Acute DVT Rt gastrocnemius vein, age indeterminate DVT Rt femoral vein and popliteal vein 5/22 Port CXR: Support lines and tubes remain in position. Low lung volumes. Partial silhouetting of left hemidiaphragm without obvious opacity. 5/23 TTE:  Moderate LVH. EF 65-70%. No wall motion abnormalities. LA mildly dilated. No AS or AR. No MR. RV normal in size & function. No effusion. 5/25 LLE Arterial Duplex:  Prelim report. No evidence of stenosis or occluded arteries. Triphasic blood flow throughout left leg.  CULTURES: 4/25 Blood >> Klebsiella pneumoniae 4/27 Blood >> negative 5/16 BAL >> oral flora  ANTIBIOTICS: 4/28 Rocephin >> 5/10  SIGNIFICANT  EVENTS: 4/25 Admitted with septic shock due to UTI. Significant ARF. 4/30 transferred out of ICU. 5/03 Cardiac arrest, anemia >> coil embolization Rt kidney by IR; PTX post-CPR 5/16 Reintubated due to airway secretions 5/18 start heparin gtt  LINES/TUBES: Rt IJ HD cath 4/26 - 5/12 ETT 5/3 - 5/13; 5/16 >> Lt femoral CVL 5/3 - 5/17 Lt chest tube 5/3 - 5/14 Rt chest tube 5/3 - 5/14 Rt IJ tunneled HD cath 5/12  >>  Lt [place] CVL 5/17 >>   NGT 5/8 >> PIV x1  ASSESSMENT / PLAN:  PULMONARY A: Acute Hypoxic Respiratory Failure - Post Cardiac Arrest. Pneumothorax after CPR 5/03 - chest tubes removed 5/14. H/O OSA. Re-intubated 5/16 due to respiratory secretions.  P:  Continuing to wean FiO2 for saturation greater than 94% Incentive spirometer for pulmonary toilette BiPAP when necessary increased work of breathing   CARDIOVASCULAR A: S/P Cardiac Arrest Shock - Hemorrhagic secondary to Rt perinephric hematoma. Questionable cuff pressure readings. No effect w/ trial of Solu-Cortef. Right Gastocnemious vein acute DVT Intermediate age DVT Right femoral & popliteal veins Ischemic Changes Left Foot - Slightly worse. Arterial duplex w/o notable findings.  P:  Levophed to keep MAP > 65 Hold outpt coreg, tricor, lasix, niacin, accupril Continue heparin gtt  Holding on Coumadin at this time Midodrine 10mg  tid  RENAL A:  Acute on Chronic Renal Failure  Hyperkalemia - Resolved. Hypophosphatemia - Mild.  H/O Left Nephrectomy for RCC  P:  Nephrology following Continuing CRRT Monitoring electrolytes per nephrology KPhos 500mg  VT x2 doses  GASTROINTESTINAL A:  H/O Dysphagia Constipation - Resolved.  P:  Holding Tube Feedings Protonix VT qday  HEMATOLOGIC / ONCOLOGIC A: Anemia - Secondary to acute blood loss from Rt perinephric hematoma. Hgb Stable. Leukocytosis - Unclear etiology. Improving. H/O Left RCC - S/P nephrectomy remotely. H/O Anemia  P:  Trending Cell Counts daily w/ CBC Heparin gtt  INFECTIOUS A:  Sepsis Klebsiella Bacteremia - Completed Antibiotic course 5/10.  P:  No antibiotics currently Trending fever curve & leukocytosis Checking blood & tracheal aspirate cultures today PCT per algorithm  ENDOCRINE A:  DM - BG controlled.  P:  AccuChecks q4hr Moderate Dose SSI per algorithm  Decreasing NPH to 10u [name] q12hr while tube feedings are on hold  NEUROLOGIC A:   Acute Encephalopathy - Possibly secondary to metabolic encephalopathy & shock. Physical Deconditioning  P:  RASS goal: 0 to -1 Fentanyl gtt & IV prn Versed IV prn Minimizing Fentanyl gtt to help with hypotension  FAMILY UPDATE: Wife updated at bedside on 5/27 by Dr. Ashok Cordia.  TODAY'S SUMMARY:  [name] w/ h/o Lt renal cell carcinoma s/p Lt nephrectomy, prostate cancer s/p XRT, PE in 2008, OSA, HTN, HLD, DM, A fib. Now with hemorrhagic shock leading to cardiac arrest, VDRF, AKI from Rt perinephric hematoma. Etiology for patient's persistent hypotension is unclear. Checking blood and tracheal aspirate cultures today. Patient developed cuff leak after my rounds this morning and was successfully extubated. The patient will utilize BiPAP support as needed for increased work of breathing and low threshold for reintubation. Holding tube feeds postextubation. Decreasing dose of NPH while tube feeds are being held.  I have spent a total of 32 minutes of critical care time today caring for the patient, updating his wife at bedside, & reviewing his electronic medical record.  Sonia Baller Ashok Cordia, M.D. [facility] Pulmonary & Critical Care Pager:  [phone] After 3pm or if no response, call [phone]  07/02/2015, 6:50 AM

## 2015-07-02 NOTE — Progress Notes (Signed)
Patient ID: [name], male   DOB: [birth year], 75 y.o.   MRN: [id number] [facility] Progress Note   Assessment/ Plan:   1. 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 Remains pressor dependent attempting to wean off-continue CRRT at current prescription (keeping fluid even). Potentially could stop CRRT on Monday night to get him off heparin and allow tracheostomy the following day. 2. VDRF: s/p HCAP, he has completed the duration of antibiotic therapy. On midodrine to assist with attempts at weaning off Levophed. Tracheostomy early next week.  3. Atrial fibrillation: Paroxysmal and currently appears to be in sinus rhythm 4. Anemia with recent hemorrhagic shock/acute blood loss anemia from right perinephric hematoma. Low Tsat but high ferritin prohibitive to intravenous iron therapy 5. Hypophosphatemia: Secondary to CRRT losses, ongoing replacement via oral route and I will also replace via intravenous route   Subjective:   No acute events overnight -he indicates he is comfortable. Tracheostomy postponed to Tuesday as patient was inadvertently continued on heparin yesterday.    Objective:   BP 93/67 mmHg  Pulse 76  Temp(Src) 98 F (36.7 C) (Oral)  Resp 21  Ht '5\' 9"'  (1.753 m)  Wt 103.5 kg (228 lb 2.8 oz)  BMI 33.68 kg/m2  SpO2 100%  Intake/Output Summary (Last 24 hours) at 07/02/15 5400 Last data filed at 07/02/15 0700  Gross per 24 hour  Intake 2424.7 ml  Output   2533 ml  Net -108.3 ml   Weight change: -4 kg (-8 lb 13.1 oz)  Physical Exam: Gen: Awake and appears to be comfortable on ventilator/CRRT CVS: Pulse regular rhythm, S1 and S2 normal Resp: Coarse breath sounds bilaterally, no rales Abd: Soft, obese,  nontender Ext: Trace lower extremity edema  Imaging: No results found.  Labs: BMET  Recent Labs Lab 06/29/15 0418 06/29/15 1620 06/30/15 0422 06/30/15 1554 07/01/15 0327 07/01/15 1600 07/02/15 0345  NA 136 137 136 136 138 137 137  K 5.4* 4.7 4.6 5.0 4.5 4.7 4.5  CL 101 102 102 103 103 105 102  CO2 '27 28 26 26 27 27 28  ' GLUCOSE 209* 183* 121* 173* 176* 136* 155*  BUN 31* 27* 29* 27* 31* 26* 27*  CREATININE 1.13 1.11 1.19 1.17 1.19 1.03 1.06  CALCIUM 8.6* 8.8* 8.8* 8.5* 8.5* 8.3* 8.6*  PHOS 2.3* 2.2* 2.4* 2.6 2.6 2.1* 1.8*   CBC  Recent Labs Lab 06/29/15 0418 06/30/15 0422 07/01/15 0327 07/02/15 0345  WBC 14.2* 15.1* 10.6* 11.2*  HGB 8.2* 8.4* 8.1* 8.4*  HCT 28.5* 29.3* 28.5* 29.5*  MCV 96.9 96.7 96.6 96.4  PLT 183 172 161 205    Medications:    . antiseptic oral rinse  7 mL Mouth Rinse QID  . chlorhexidine gluconate (SAGE KIT)  15 mL Mouth Rinse BID  . feeding supplement (PRO-STAT SUGAR FREE 64)  30 mL Per Tube QID  . insulin aspart  0-15 Units Subcutaneous Q4H  . insulin NPH Human  25 Units Subcutaneous Q12H  . midodrine  10 mg Oral TID AC  . pantoprazole sodium  40 mg Per Tube Q1200  . phosphorus  500 mg Per Tube BID  . sodium chloride flush  10-40 mL Intracatheter Q12H   Elmarie Shiley, MD 07/02/2015, 7:28 AM

## 2015-07-02 NOTE — Progress Notes (Signed)
ANTICOAGULATION CONSULT NOTE - Follow-up Consult  Pharmacy Consult for heparin Indication: DVT  No Known Allergies  Patient Measurements: Height: 5\' 9"  (175.3 cm) Weight: 228 lb 2.8 oz (103.5 kg) IBW/kg (Calculated) : 70.7 Heparin Dosing Weight: 97.5 kg  Vital Signs: Temp: 98.3 F (36.8 C) (05/27 0738) Temp Source: Oral (05/27 0738) BP: 81/72 mmHg (05/27 0800) Pulse Rate: 75 (05/27 0800)  Labs:  Recent Labs  06/30/15 0422  06/30/15 1555 07/01/15 0327 07/01/15 1600 07/02/15 0345  HGB 8.4*  --   --  8.1*  --  8.4*  HCT 29.3*  --   --  28.5*  --  29.5*  PLT 172  --   --  161  --  205  HEPARINUNFRC 0.55  --  0.55 0.51  --  0.44  CREATININE 1.19  < >  --  1.19 1.03 1.06  < > = values in this interval not displayed.  Estimated Creatinine Clearance: 73.6 mL/min (by C-G formula based on Cr of 1.06).  Assessment: 75 y.o. male admitted on 05/31/2015 with sepsis. Transferred to ICU s/p cardiac arrest with hemorrhagic and septic shock on 5/3  PMH: prostate ca s/p radiation 2011, renal cell ca s/p left nephrectomy, afib/hx of dvt on warfarin  Anticoagulation: Coumadin PTA for AFib + hx DVT/PE.  5/10 HIT panel neg, SRA neg 5/11 Dopplers: + acute DVT in R leg On Heparin for acute DVT at 1100 units/hr with level of 0.44  Cardiovascular: HTN/DL and with hemorrhagic shock (R-perinephric hematoma), cardiac arrest (5/3) this admit. (EF 65-70% on repeat 5/23).   Hypotensive SBP 70-90s on Levophed @ 25 needs continue to increase - unable to wean. Midodrine 10 TID HR 70-80s in afib  Nephrology: One kidney, due to renal embolization + previous nephrectomy. On CRRT 5/3 >> present. Likely HD dependant - considering holding Mon evening for trach tues.  Hematology / Oncology: H&H 8.4/29.5, Plt 205  Goal of Therapy:  Heparin level 0.3-0.7 units/mL  Monitor platelets by anticoagulation protocol: Yes   Plan:  - Continue heparin 1100 units/hr - Daily heparin level, CBC - monitor S/sx  of bleeding, CBGs - Trach probably Tue  Levester Fresh, PharmD, BCPS, [facility] Clinical Pharmacist Pager [phone] 07/02/2015 9:16 AM

## 2015-07-02 NOTE — Progress Notes (Signed)
RT went to patient room due to hearing ventilator alarm.  Upon arrival to patient room, patient low minute ventilation was ringing off and could hear sound above patient tube.  Added air to ETT cuff and made sure that tube was in proper place.  Patient has been weaning on CPAP/PSV (5/5) since this AM.  MD was notified and decided to give patient a chance of extubation.  Orders were given to extubate along with PRN Bipap orders in case of decompensation.  RT will continue to monitor.

## 2015-07-02 NOTE — Procedures (Signed)
Extubation Procedure Note  Patient Details:   Name: [name] DOB: 24-Jan-1941 MRN: [id number]   Airway Documentation:     Evaluation  O2 sats: stable throughout Complications: No apparent complications Patient did tolerate procedure well. Bilateral Breath Sounds: Clear   Yes   Patient extubated to 4L nasal cannula per MD order.  Sats currently 100%.  Vitals are stable.  Patient able to speak post extubation.  No evidence of stridor.  No complications noted.    Philomena Doheny 07/02/2015, 3:03 PM

## 2015-07-03 LAB — CBC
HCT: 29.2 % — ABNORMAL LOW (ref 39.0–52.0)
HEMOGLOBIN: 8.5 g/dL — AB (ref 13.0–17.0)
MCH: 28.3 pg (ref 26.0–34.0)
MCHC: 29.1 g/dL — ABNORMAL LOW (ref 30.0–36.0)
MCV: 97.3 fL (ref 78.0–100.0)
PLATELETS: 199 10*3/uL (ref 150–400)
RBC: 3 MIL/uL — AB (ref 4.22–5.81)
RDW: 20 % — ABNORMAL HIGH (ref 11.5–15.5)
WBC: 10.5 10*3/uL (ref 4.0–10.5)

## 2015-07-03 LAB — RENAL FUNCTION PANEL
ALBUMIN: 2.2 g/dL — AB (ref 3.5–5.0)
ALBUMIN: 2.2 g/dL — AB (ref 3.5–5.0)
Anion gap: 8 (ref 5–15)
Anion gap: 8 (ref 5–15)
BUN: 26 mg/dL — AB (ref 6–20)
BUN: 20 mg/dL (ref 6–20)
CALCIUM: 8.5 mg/dL — AB (ref 8.9–10.3)
CHLORIDE: 102 mmol/L (ref 101–111)
CO2: 27 mmol/L (ref 22–32)
CO2: 26 mmol/L (ref 22–32)
CREATININE: 1.2 mg/dL (ref 0.61–1.24)
CREATININE: 1.05 mg/dL (ref 0.61–1.24)
Calcium: 8.5 mg/dL — ABNORMAL LOW (ref 8.9–10.3)
Chloride: 102 mmol/L (ref 101–111)
GFR calc Af Amer: >60 mL/min (ref >60)
GFR calc non Af Amer: >60 mL/min (ref >60)
GFR, EST NON AFRICAN AMERICAN: 58 mL/min — AB (ref >60)
GLUCOSE: 131 mg/dL — AB (ref 65–99)
Glucose, Bld: 151 mg/dL — ABNORMAL HIGH (ref 65–99)
PHOSPHORUS: 3 mg/dL (ref 2.5–4.6)
PHOSPHORUS: 2.2 mg/dL — AB (ref 2.5–4.6)
Potassium: 4.2 mmol/L (ref 3.5–5.1)
Potassium: 4.4 mmol/L (ref 3.5–5.1)
SODIUM: 136 mmol/L (ref 135–145)
Sodium: 137 mmol/L (ref 135–145)

## 2015-07-03 LAB — GLUCOSE, CAPILLARY
GLUCOSE-CAPILLARY: 152 mg/dL — AB (ref 65–99)
GLUCOSE-CAPILLARY: 108 mg/dL — AB (ref 65–99)
GLUCOSE-CAPILLARY: 180 mg/dL — AB (ref 65–99)
Glucose-Capillary: 118 mg/dL — ABNORMAL HIGH (ref 65–99)
Glucose-Capillary: 134 mg/dL — ABNORMAL HIGH (ref 65–99)
Glucose-Capillary: 145 mg/dL — ABNORMAL HIGH (ref 65–99)

## 2015-07-03 LAB — MAGNESIUM: MAGNESIUM: 2.5 mg/dL — AB (ref 1.7–2.4)

## 2015-07-03 LAB — HEPARIN LEVEL (UNFRACTIONATED): Heparin Unfractionated: 0.47 IU/mL (ref 0.30–0.70)

## 2015-07-03 LAB — PROCALCITONIN: PROCALCITONIN: 2.18 ng/mL

## 2015-07-03 NOTE — Progress Notes (Signed)
PULMONARY / CRITICAL CARE MEDICINE   Name: [name] MRN: [id number] DOB: Nov 04, 1940    ADMISSION DATE:  05/31/2015  REFERRING MD:  EDP  CHIEF COMPLAINT:  Hypotension   SUBJECTIVE: Extubated yesterday with large cuff leak. Denies any difficulty breathing or dyspnea. No chest pain or pressure.  REVIEW OF SYSTEMS: No headache. No fever, chills, or sweats. No nausea or abdominal pain.  VITAL SIGNS: BP 105/74 mmHg  Pulse 72  Temp(Src) 98.4 F (36.9 C) (Oral)  Resp 18  Ht 5\' 9"  (1.753 m)  Wt 230 lb 9.6 oz (104.6 kg)  BMI 34.04 kg/m2  SpO2 100%  INTAKE / OUTPUT: I/O last 3 completed shifts: In: 3717.9 [I.V.:1676.9; NG/GT:1740; IV Piggyback:301] Out: 3702 [Other:3702]  PHYSICAL EXAMINATION: General:  Watching TV. Sitting up in a chair. Wife at bedside.  Integument:  Warm & dry. No rash on exposed skin. Bluish discoloration on left foot unchanged. HEENT:  No scleral icterus. Tacky mucus membranes. Cardiovascular:  Regular rate with irregular rhythm in atrial fibrillation on telemetry. No edema. No appreciable JVD in sitting position.  Pulmonary:  Diminished breath sounds bilateral lung bases. Normal work of breathing on [place] oxygen. Weak voice. Abdomen: Soft. Normal bowel sounds. Nondistended. Nontender Neurological:  Moving all 4 extremities equally. Hand grip symmetric. Follows commands. Moving all four limbs equally. Oriented to place, person, & president.  LABS:  BMET  Recent Labs Lab 07/02/15 0345 07/02/15 1632 07/03/15 0411  NA 137 134* 137  K 4.5 4.4 4.2  CL 102 99* 102  CO2 28 28 27   BUN 27* 29* 26*  CREATININE 1.06 1.23 1.20  GLUCOSE 155* 262* 151*    Electrolytes  Recent Labs Lab 07/01/15 0327  07/02/15 0345 07/02/15 1632 07/03/15 0411  CALCIUM 8.5*  < > 8.6* 8.3* 8.5*  MG 2.4  --  2.4  --  2.5*  PHOS 2.6  < > 1.8* 4.7* 3.0  < > = values in this interval not displayed.  CBC  Recent Labs Lab 07/01/15 0327 07/02/15 0345 07/03/15 0411   WBC 10.6* 11.2* 10.5  HGB 8.1* 8.4* 8.5*  HCT 28.5* 29.5* 29.2*  PLT 161 205 199    Coag's No results for input(s): APTT, INR in the last 168 hours.  Sepsis Markers  Recent Labs Lab 07/02/15 0718 07/03/15 0411  PROCALCITON 2.01 2.18    ABG No results for input(s): PHART, PCO2ART, PO2ART in the last 168 hours.  Liver Enzymes  Recent Labs Lab 07/02/15 0345 07/02/15 1632 07/03/15 0411  ALBUMIN 2.2* 2.2* 2.2*    Cardiac Enzymes No results for input(s): TROPONINI, PROBNP in the last 168 hours.  Glucose  Recent Labs Lab 07/02/15 0741 07/02/15 1145 07/02/15 1556 07/02/15 2037 07/03/15 0015 07/03/15 0425  GLUCAP 165* 256* 63* 185* 145* 134*    Imaging No results found.   STUDIES:  4/26 Renal u/s >> s/p Lt nephrectomy 4/27 CT abd/pelvis >> Rt perinephric hematoma 5/02 CT abd/pelvis >> increased size of hematoma 5/08 Echo >> EF 65 to 70% 5/18 Doppler legs b/l >> Acute DVT Rt gastrocnemius vein, age indeterminate DVT Rt femoral vein and popliteal vein 5/22 Port CXR: Support lines and tubes remain in position. Low lung volumes. Partial silhouetting of left hemidiaphragm without obvious opacity. 5/23 TTE:  Moderate LVH. EF 65-70%. No wall motion abnormalities. LA mildly dilated. No AS or AR. No MR. RV normal in size & function. No effusion. 5/25 LLE Arterial Duplex:  Prelim report. No evidence of stenosis or occluded arteries.  Triphasic blood flow throughout left leg.  MICROBIOLOGY: 4/25 Blood >> Klebsiella pneumoniae 4/27 Blood >> negative 5/16 BAL >> oral flora 5/27 Trach Asp >> 5/27 Blood Ctx >>  ANTIBIOTICS: 4/28 Rocephin >> 5/10  SIGNIFICANT EVENTS: 4/25 Admitted with septic shock due to UTI. Significant ARF. 4/30 transferred out of ICU. 5/03 Cardiac arrest, anemia >> coil embolization Rt kidney by IR; PTX post-CPR 5/16 Reintubated due to airway secretions 5/18 start heparin gtt  LINES/TUBES: Rt IJ HD cath 4/26 - 5/12 ETT 5/3 - 5/13; 5/16  >> Lt femoral CVL 5/3 - 5/17 Lt chest tube 5/3 - 5/14 Rt chest tube 5/3 - 5/14 Rt IJ tunneled HD cath 5/12  >>  Lt [place] CVL 5/17 >>  NGT 5/8 >> PIV x1  ASSESSMENT / PLAN:  PULMONARY A: Acute Hypoxic Respiratory Failure - Post Cardiac Arrest. Pneumothorax after CPR 5/03 - chest tubes removed 5/14. H/O OSA. Re-intubated 5/16 due to respiratory secretions.  P:  Continuing to wean FiO2 for saturation greater than 94% Incentive spirometer for pulmonary toilette BiPAP when necessary increased work of breathing  CARDIOVASCULAR A: S/P Cardiac Arrest Shock - Hemorrhagic secondary to Rt perinephric hematoma. Questionable cuff pressure readings. No effect w/ trial of Solu-Cortef. Right Gastocnemious vein acute DVT Intermediate age DVT Right femoral & popliteal veins Ischemic Changes Left Foot - Slightly worse. Arterial duplex w/o notable findings.  P:  Levophed to keep MAP > 65 Hold outpt coreg, tricor, lasix, niacin, accupril Continue heparin gtt  Holding on Coumadin at this time Midodrine 10mg  tid  RENAL A:  Acute on Chronic Renal Failure  Hyperkalemia - Resolved. Hypophosphatemia - Mild.  H/O Left Nephrectomy for RCC  P:  Nephrology following Continuing CRRT Monitoring electrolytes per nephrology KPhos 500mg  VT x2 doses  GASTROINTESTINAL A:  H/O Dysphagia Constipation - Resolved.  P:  Restart Tube Feedings Protonix VT qday Speech eval  HEMATOLOGIC / ONCOLOGIC A: Anemia - Secondary to acute blood loss from Rt perinephric hematoma. Hgb Stable. Leukocytosis - Unclear etiology. Improving. H/O Left RCC - S/P nephrectomy remotely. H/O Anemia  P:  Trending Cell Counts daily w/ CBC Heparin gtt  INFECTIOUS A:  Sepsis Klebsiella Bacteremia - Completed Antibiotic course 5/10.  P:  No antibiotics currently Trending fever curve & leukocytosis Following cultures from 5/27 PCT per algorithm  ENDOCRINE A:  DM - BG controlled.  P:   AccuChecks q4hr Moderate Dose SSI per algorithm   NPH to 10u  q12hr but will likely need to increase  NEUROLOGIC A:  Acute Encephalopathy - Possibly secondary to metabolic encephalopathy & shock. Resolving. Physical Deconditioning  P:  PT/OT eval  Fentanyl IV prn  FAMILY UPDATE: Wife updated at bedside on 5/28 by Dr. Ashok Cordia.  TODAY'S SUMMARY:  [name] w/ h/o Lt renal cell carcinoma s/p Lt nephrectomy, prostate cancer s/p XRT, PE in 2008, OSA, HTN, HLD, DM, A fib. Now with hemorrhagic shock leading to cardiac arrest, VDRF, AKI from Rt perinephric hematoma. Etiology for patient's persistent hypotension is unclear. Cultures pending. Tolerating extubation well. PT/OT & Speech eval pending. Restarting tube feedings.  I have spent a total of 34 minutes of critical care time today caring for the patient, updating his wife at bedside, & reviewing his electronic medical record.  Sonia Baller Ashok Cordia, M.D. [facility] Pulmonary & Critical Care Pager:  [phone] After 3pm or if no response, call [phone]  07/03/2015, 6:53 AM

## 2015-07-03 NOTE — Progress Notes (Signed)
ANTICOAGULATION CONSULT NOTE - Follow-up Consult  Pharmacy Consult for heparin Indication: DVT  No Known Allergies  Patient Measurements: Height: 5\' 9"  (175.3 cm) Weight: 230 lb 9.6 oz (104.6 kg) IBW/kg (Calculated) : 70.7 Heparin Dosing Weight: 97.5 kg  Vital Signs: Temp: 98.6 F (37 C) (05/28 0749) Temp Source: Oral (05/28 0749) BP: 104/78 mmHg (05/28 0730) Pulse Rate: 89 (05/28 0730)  Labs:  Recent Labs  07/01/15 0327  07/02/15 0345 07/02/15 1632 07/03/15 0411  HGB 8.1*  --  8.4*  --  8.5*  HCT 28.5*  --  29.5*  --  29.2*  PLT 161  --  205  --  199  HEPARINUNFRC 0.51  --  0.44  --  0.47  CREATININE 1.19  < > 1.06 1.23 1.20  < > = values in this interval not displayed.  Estimated Creatinine Clearance: 65.4 mL/min (by C-G formula based on Cr of 1.2).  Assessment: 75 y.o. male admitted on 05/31/2015 with sepsis. Transferred to ICU s/p cardiac arrest with hemorrhagic and septic shock on 5/3  PMH: prostate ca s/p radiation 2011, renal cell ca s/p left nephrectomy, afib/hx of dvt on warfarin  Anticoagulation: Coumadin PTA for AFib + hx DVT/PE.  5/10 HIT panel neg, SRA neg 5/11 Dopplers: + acute DVT in R leg On Heparin for acute DVT at 1100 units/hr with level of 0.47  Cardiovascular: HTN/DL and with hemorrhagic shock (R-perinephric hematoma), cardiac arrest (5/3) this admit. (EF 65-70% on repeat 5/23).   Hypotensive SBP 70-90s on Levophed @ 25 needs continue to increase - unable to wean. Midodrine 10 TID HR 70-80s in afib  Nephrology: One kidney, due to renal embolization + previous nephrectomy. On CRRT 5/3 >> present. Likely HD dependant   Hematology / Oncology: H&H 8.5/29.2, Plt 199  Goal of Therapy:  Heparin level 0.3-0.7 units/mL  Monitor platelets by anticoagulation protocol: Yes   Plan:  - Continue heparin 1100 units/hr - Daily heparin level, CBC - monitor S/sx of bleeding  Levester Fresh, PharmD, BCPS, [facility] [place] Clinical Pharmacist Pager  [phone] 07/03/2015 10:27 AM

## 2015-07-03 NOTE — Progress Notes (Signed)
Patient resting comfortably on 4L nasal cannula.  Sats currently 100%.  Vitals are stable.  No distress noted.  Bipap not needed at this time.  Will continue to monitor.

## 2015-07-03 NOTE — Progress Notes (Signed)
Patient ID: [name], male   DOB: 1940/07/16, 75 y.o.   MRN: [id number] [facility] Progress Note   Assessment/ Plan:   1. AKI on CKD3: Anuric, dependent on RRT with high likelihood that he has progressed to dialysis dependent ESRD given limited renal reserve from prior history of right solitary kidney (status post left nephrectomy for RCC) that required to be partially coiled/embolized secondary to perinephric hematoma. Pressor dependent hypotension limiting aggressive ultrafiltration-currently keeping him even, stop CRRT this evening. 2. VDRF: s/p HCAP, he has completed the duration of antibiotic therapy. On midodrine to assist with attempts at weaning off Levophed. Tracheostomy early next week.  3. Atrial fibrillation: Paroxysmal and currently appears to be in sinus rhythm 4. Anemia with recent hemorrhagic shock/acute blood loss anemia from right perinephric hematoma. Low Tsat but high ferritin prohibitive to intravenous iron therapy 5. Hypophosphatemia: Secondary to CRRT losses, replaced via oral route and intravenous route   Subjective:   Extubated yesterday, currently indicates that he is comfortable. He remains optimistic about renal recovery    Objective:   BP 107/68 mmHg  Pulse 85  Temp(Src) 98.6 F (37 C) (Oral)  Resp 17  Ht _0  (1.753 m)  Wt 104.6 kg (230 lb 9.6 oz)  BMI 34.04 kg/m2  SpO2 100%  Intake/Output Summary (Last 24 hours) at 07/03/15 0753 Last data filed at 07/03/15 0700  Gross per 24 hour  Intake   1916 ml  Output   1825 ml  Net     91 ml   Weight change: 1.1 kg (2 lb 6.8 oz)  Physical Exam: Gen: Awake and appears comfortable on CRRT CVS: Pulse regular rhythm, S1 and S2 normal Resp: Coarse breath sounds bilaterally, no rales Abd: Soft, obese, nontender Ext: Trace lower extremity edema  Imaging: No results found.  Labs: BMET  Recent Labs Lab 06/30/15 0422 06/30/15 1554 07/01/15 0327 07/01/15 1600 07/02/15 0345  07/02/15 1632 07/03/15 0411  NA 136 136 138 137 137 134* 137  K 4.6 5.0 4.5 4.7 4.5 4.4 4.2  CL 102 103 103 105 102 99* 102  CO2 _1 GLUCOSE 121* 173* 176* 136* 155* 262* 151*  BUN 29* 27* 31* 26* 27* 29* 26*  CREATININE 1.19 1.17 1.19 1.03 1.06 1.23 1.20  CALCIUM 8.8* 8.5* 8.5* 8.3* 8.6* 8.3* 8.5*  PHOS 2.4* 2.6 2.6 2.1* 1.8* 4.7* 3.0   CBC  Recent Labs Lab 06/30/15 0422 07/01/15 0327 07/02/15 0345 07/03/15 0411  WBC 15.1* 10.6* 11.2* 10.5  HGB 8.4* 8.1* 8.4* 8.5*  HCT 29.3* 28.5* 29.5* 29.2*  MCV 96.7 96.6 96.4 97.3  PLT 172 161 205 199    Medications:    . antiseptic oral rinse  7 mL Mouth Rinse QID  . chlorhexidine gluconate (SAGE KIT)  15 mL Mouth Rinse BID  . feeding supplement (PRO-STAT SUGAR FREE 64)  30 mL Per Tube QID  . insulin aspart  0-15 Units Subcutaneous Q4H  . insulin NPH Human  10 Units Subcutaneous Q12H  . midodrine  10 mg Oral TID AC  . pantoprazole sodium  40 mg Per Tube Q1200  . sodium chloride flush  10-40 mL Intracatheter Q12H   Elmarie Shiley, MD 07/03/2015, 7:53 AM

## 2015-07-04 LAB — GLUCOSE, CAPILLARY
GLUCOSE-CAPILLARY: 153 mg/dL — AB (ref 65–99)
GLUCOSE-CAPILLARY: 206 mg/dL — AB (ref 65–99)
GLUCOSE-CAPILLARY: 168 mg/dL — AB (ref 65–99)
GLUCOSE-CAPILLARY: 212 mg/dL — AB (ref 65–99)
Glucose-Capillary: 123 mg/dL — ABNORMAL HIGH (ref 65–99)
Glucose-Capillary: 163 mg/dL — ABNORMAL HIGH (ref 65–99)

## 2015-07-04 LAB — PHOSPHORUS: Phosphorus: 3.3 mg/dL (ref 2.5–4.6)

## 2015-07-04 LAB — BASIC METABOLIC PANEL
ANION GAP: 9 (ref 5–15)
BUN: 48 mg/dL — ABNORMAL HIGH (ref 6–20)
CHLORIDE: 102 mmol/L (ref 101–111)
CO2: 26 mmol/L (ref 22–32)
Calcium: 8.5 mg/dL — ABNORMAL LOW (ref 8.9–10.3)
Creatinine, Ser: 2.11 mg/dL — ABNORMAL HIGH (ref 0.61–1.24)
GFR calc Af Amer: 34 mL/min — ABNORMAL LOW (ref >60)
GFR, EST NON AFRICAN AMERICAN: 29 mL/min — AB (ref >60)
GLUCOSE: 177 mg/dL — AB (ref 65–99)
POTASSIUM: 4.4 mmol/L (ref 3.5–5.1)
Sodium: 137 mmol/L (ref 135–145)

## 2015-07-04 LAB — CBC WITH DIFFERENTIAL/PLATELET
Basophils Absolute: 0 10*3/uL (ref 0.0–0.1)
Basophils Relative: 0 %
EOS ABS: 0.2 10*3/uL (ref 0.0–0.7)
Eosinophils Relative: 2 %
HEMATOCRIT: 27.8 % — AB (ref 39.0–52.0)
HEMOGLOBIN: 7.9 g/dL — AB (ref 13.0–17.0)
LYMPHS ABS: 1 10*3/uL (ref 0.7–4.0)
Lymphocytes Relative: 11 %
MCH: 27.4 pg (ref 26.0–34.0)
MCHC: 28.4 g/dL — ABNORMAL LOW (ref 30.0–36.0)
MCV: 96.5 fL (ref 78.0–100.0)
Monocytes Absolute: 1.1 10*3/uL — ABNORMAL HIGH (ref 0.1–1.0)
Monocytes Relative: 13 %
NEUTROS ABS: 6.3 10*3/uL (ref 1.7–7.7)
NEUTROS PCT: 74 %
Platelets: 228 10*3/uL (ref 150–400)
RBC: 2.88 MIL/uL — AB (ref 4.22–5.81)
RDW: 20 % — ABNORMAL HIGH (ref 11.5–15.5)
WBC: 8.6 10*3/uL (ref 4.0–10.5)

## 2015-07-04 LAB — HEPARIN LEVEL (UNFRACTIONATED)
HEPARIN UNFRACTIONATED: 0.12 [IU]/mL — AB (ref 0.30–0.70)
Heparin Unfractionated: 0.35 IU/mL (ref 0.30–0.70)
Heparin Unfractionated: 0.39 IU/mL (ref 0.30–0.70)

## 2015-07-04 LAB — PROCALCITONIN: PROCALCITONIN: 2.81 ng/mL

## 2015-07-04 LAB — MAGNESIUM: Magnesium: 2.5 mg/dL — ABNORMAL HIGH (ref 1.7–2.4)

## 2015-07-04 MED ORDER — SODIUM CHLORIDE 0.9 % IV SOLN
510.0000 mg | Freq: Once | INTRAVENOUS | Status: AC
  Administered 2015-07-04: 510 mg via INTRAVENOUS
  Filled 2015-07-04 (×2): qty 17

## 2015-07-04 MED ORDER — HEPARIN BOLUS VIA INFUSION
2000.0000 [IU] | Freq: Once | INTRAVENOUS | Status: AC
  Administered 2015-07-04: 2000 [IU] via INTRAVENOUS
  Filled 2015-07-04: qty 2000

## 2015-07-04 NOTE — Progress Notes (Signed)
Subjective: Interval History: has no complaint..  Objective: Vital signs in last 24 hours: Temp:  [98.1 F (36.7 C)-99.7 F (37.6 C)] 99.7 F (37.6 C) (05/29 0400) Pulse Rate:  [73-131] 90 (05/29 0615) Resp:  [17-35] 20 (05/29 0615) BP: (77-145)/(44-89) 94/59 mmHg (05/29 0615) SpO2:  [78 %-100 %] 100 % (05/29 0615) Weight:  [104.6 kg (230 lb 9.6 oz)] 104.6 kg (230 lb 9.6 oz) (05/29 0400) Weight change: 0 kg (0 lb)  Intake/Output from previous day: 05/28 0701 - 05/29 0700 In: 1806.5 [I.V.:801.5; NG/GT:1005] Out: 526  Intake/Output this shift: Total I/O In: 1033.9 [I.V.:378.9; NG/GT:655] Out: -   General appearance: alert, cooperative, moderately obese and pale Resp: diminished breath sounds bilaterally Chest wall: RIJ PC Cardio: S1, S2 normal GI: soft, non-tender; bowel sounds normal; no masses,  no organomegaly Extremities: edema 2+_  Lab Results:  Recent Labs  07/03/15 0411 07/04/15 0355  WBC 10.5 8.6  HGB 8.5* 7.9*  HCT 29.2* 27.8*  PLT 199 228   BMET:  Recent Labs  07/03/15 1538 07/04/15 0355  NA 136 137  K 4.4 4.4  CL 102 102  CO2 26 26  GLUCOSE 131* 177*  BUN 20 48*  CREATININE 1.05 2.11*  CALCIUM 8.5* 8.5*   No results for input(s): PTH in the last 72 hours. Iron Studies: No results for input(s): IRON, TIBC, TRANSFERRIN, FERRITIN in the last 72 hours.  Studies/Results: No results found.  I have reviewed the patient's current medications.  Assessment/Plan: 1 AKI, oliuric due to low renal mass, hemodynamic.  On pressors yet. Solute, acid/base/K ok. 2 Renal bleed 3Afib on anticoag 4 Shock still on pressors 5 Sepsis  Of AB 6 obesity P follow chem, anticipate CRRT if not better, try wean pressors    LOS: 34 days   Stepfon Rawles L 07/04/2015,6:57 AM

## 2015-07-04 NOTE — Progress Notes (Signed)
ANTICOAGULATION CONSULT NOTE  Pharmacy Consult for heparin Indication: DVT  No Known Allergies  Patient Measurements: Height: 5\' 9"  (175.3 cm) Weight: 230 lb 9.6 oz (104.6 kg) IBW/kg (Calculated) : 70.7 Heparin Dosing Weight: 97.5 kg  Vital Signs: Temp: 98.5 F (36.9 C) (05/29 1952) Temp Source: Oral (05/29 1952) BP: 94/63 mmHg (05/29 2200) Pulse Rate: 62 (05/29 2200)  Labs:  Recent Labs  07/02/15 0345  07/03/15 0411 07/03/15 1538 07/04/15 0355 07/04/15 1239 07/04/15 2335  HGB 8.4*  --  8.5*  --  7.9*  --   --   HCT 29.5*  --  29.2*  --  27.8*  --   --   PLT 205  --  199  --  228  --   --   HEPARINUNFRC 0.44  --  0.47  --  0.12* 0.39 0.35  CREATININE 1.06  < > 1.20 1.05 2.11*  --   --   < > = values in this interval not displayed.  Estimated Creatinine Clearance: 37.2 mL/min (by C-G formula based on Cr of 2.11).  Assessment: 75 y.o. male with recent DVTs, Coumadin on hold, for heparin   Goal of Therapy:  Heparin level 0.3-0.7 units/mL  Monitor platelets by anticoagulation protocol: Yes   Plan:  Continue Heparin at current rate Follow-up am labs.  Phillis Knack, PharmD, BCPS

## 2015-07-04 NOTE — Progress Notes (Signed)
[place] [facility] Progress Note Patient Name: [name] DOB: 1940-10-22 MRN: [id number]   Date of Service  07/04/2015  HPI/Events of Note  Request to order AM labs.  eICU Interventions  Will order: 1. CBC, BMP, Mg++ and PO4--- in AM.     Intervention Category Minor Interventions: Clinical assessment - ordering diagnostic tests  Sommer,Steven Eugene 07/04/2015, 1:13 AM

## 2015-07-04 NOTE — Progress Notes (Signed)
Patient resting comfortably on nasal cannula at this time.  Bipap not needed.  Will continue to monitor.

## 2015-07-04 NOTE — Progress Notes (Signed)
ANTICOAGULATION CONSULT NOTE - Follow-up Consult  Pharmacy Consult for heparin Indication: DVT  No Known Allergies  Patient Measurements: Height: 5\' 9"  (175.3 cm) Weight: 230 lb 9.6 oz (104.6 kg) IBW/kg (Calculated) : 70.7 Heparin Dosing Weight: 97.5 kg  Vital Signs: Temp: 99.7 F (37.6 C) (05/29 0400) Temp Source: Oral (05/29 0400) BP: 100/59 mmHg (05/29 0415) Pulse Rate: 98 (05/29 0415)  Labs:  Recent Labs  07/02/15 0345  07/03/15 0411 07/03/15 1538 07/04/15 0355  HGB 8.4*  --  8.5*  --  7.9*  HCT 29.5*  --  29.2*  --  27.8*  PLT 205  --  199  --  228  HEPARINUNFRC 0.44  --  0.47  --  0.12*  CREATININE 1.06  < > 1.20 1.05 2.11*  < > = values in this interval not displayed.  Estimated Creatinine Clearance: 37.2 mL/min (by C-G formula based on Cr of 2.11).  Assessment: 75 y.o. male admitted on 05/31/2015 with sepsis. Transferred to ICU s/p cardiac arrest with hemorrhagic and septic shock on 5/3  PMH: prostate ca s/p radiation 2011, renal cell ca s/p left nephrectomy, afib/hx of dvt on warfarin  Anticoagulation: Coumadin PTA for AFib + hx DVT/PE.  5/10 HIT panel neg, SRA neg 5/11 Dopplers: + acute DVT in R leg On Heparin for acute DVT at 1100 units/hr with level of 0.47  This morning's HL is now subtherapeutic at 0.12 on heparin 1100 units/hr. Nurse reports no issues with infusion or bleeding.  Goal of Therapy:  Heparin level 0.3-0.7 units/mL  Monitor platelets by anticoagulation protocol: Yes   Plan:  Bolus 2000 units and increase heparin to 1450 units/hr 8h HL Daily heparin level, CBC Monitor S/sx of bleeding  Andrey Cota. Diona Foley, PharmD, [employer] Clinical Pharmacist Pager [phone] 07/04/2015 4:52 AM

## 2015-07-04 NOTE — Evaluation (Signed)
Clinical/Bedside Swallow Evaluation Patient Details  Name: [name] MRN: [id number] Date of Birth: 07-24-1940  Today's Date: 07/04/2015 Time: SLP Start Time (ACUTE ONLY): [id number] SLP Stop Time (ACUTE ONLY): 0945 SLP Time Calculation (min) (ACUTE ONLY): 40 min  Past Medical History:  Past Medical History  Diagnosis Date  . Obesity   . Phlebitis     Lower extermity  . Pulmonary emboli ([place]) 2008    submassive, saddle  . Prostate cancer ([place]) 07/2009  . Sleep apnea     on CPAP  . Hx of echocardiogram 12/04/2010    Normal EF >55% no significant valve disease  . History of stress test 06/27/2009    Low risk and EF of approximately 50%  . DVT (deep venous thrombosis) ([place])   . Chronic kidney disease, stage 3     baseline creatinine ~1.4  . HLD (hyperlipidemia)   . HTN (hypertension)   . Dysrhythmia     A fib  . Diabetes mellitus ([place])     diet controlled  . History of hiatal hernia    Past Surgical History:  Past Surgical History  Procedure Laterality Date  . Nephrectomy  1999    for CA  . Cholecystectomy  1999  . Transurethral resection of bladder tumor N/A 03/04/2013    Procedure: CYSTOSCOPY WITH RIGHT RETROGRADE PYELOGRAM AND BLADDER BIOPSY /CLOT EVACUATION/ BIOPSY PROSTATIC URETHRA WITH FULGERATION ;  Surgeon: Molli Hazard, MD;  Location: [facility];  Service: Urology;  Laterality: N/A;  . Cystoscopy w/ retrogrades N/A 04/08/2013    Procedure: CYSTOSCOPY WITH CLOT EVACUATION;  Surgeon: Molli Hazard, MD;  Location: [facility];  Service: Urology;  Laterality: N/A;  . Left shoulder repair    . Shoulder open rotator cuff repair Right 05/03/2015    Procedure: RIGHT SHOULDER ROTATOR CUFF REPAIR OPEN WITH GRAFT AND ANCHOR ;  Surgeon: Latanya Maudlin, MD;  Location: [facility];  Service: Orthopedics;  Laterality: Right;   HPI:  75 yo male with hemorrhagic shock leading to cardiac arrest, VDRF, AKI from Rt perinephric hematoma. He was intubated 5/03-5/13, 5/16-5/27. He has hx  of Lt renal cell carcinoma s/p Lt nephrectomy, prostate cancer s/p XRT, PE in 2008, OSA, HTN, HLD, DM, A fib.   Assessment / Plan / Recommendation Clinical Impression  New orders received for BSE following second intubation. Oral care was completed with suction. Pt continues to exhibit adequate oral motor strength and function with adequate dentition. Pt appeared to tolerate ice chip trials without overt s/s aspiration, however, hyolaryngeal elevation appeared reduced, and swallow reflex delayed. Small sips of water elicited delayed wet nonproductive cough. Same response noted after puree trials. Voice quality continues weak and breathy, indicative of poor vocal fold closure, which increases risk of aspiration. Additionally, pt was intubated 2x for a total of 25 days, raising liklihood for irritation and swelling with anticipated decrease in sensation of penetrate/aspirate. At this time, NPO status is recommended to continue, with ice chips ONLY following thorough oral care. Objective study is recommended prior to initiation of po intake. Will schedule MBS for Tuesday 07/05/15. RN and wife aware and in agreement. Sign placed at head of bed regarding provision of ice chips.    Aspiration Risk  Moderate aspiration risk    Diet Recommendation NPO;Alternative means - temporary;Ice chips PRN after oral care   Medication Administration: Via alternative means Supervision:  (1:1 supervision during ice chip trials) Compensations: Minimize environmental distractions;Slow rate Postural Changes: Seated upright at 90 degrees  Other  Recommendations Oral Care Recommendations: Oral care QID;Oral care prior to ice chip/H20;Staff/trained caregiver to provide oral care Other Recommendations: Have oral suction available   Follow up Recommendations   (to be determined)    Frequency and Duration min 2x/week  2 weeks       Prognosis Prognosis for Safe Diet Advancement: Good Barriers to Reach Goals: Motivation       Swallow Study   General Date of Onset: 05/31/15 HPI: 75 yo male with hemorrhagic shock leading to cardiac arrest, VDRF, AKI from Rt perinephric hematoma. He was intubated 5/03-5/13, 5/16-5/27. He has hx of Lt renal cell carcinoma s/p Lt nephrectomy, prostate cancer s/p XRT, PE in 2008, OSA, HTN, HLD, DM, A fib. Type of Study: Bedside Swallow Evaluation Previous Swallow Assessment: BSE completed 06/21/15 with recommendation for NPO status Diet Prior to this Study: NPO (panda) Temperature Spikes Noted: No Respiratory Status: Nasal cannula History of Recent Intubation: Yes Length of Intubations (days): 25 days Date extubated: 07/02/15 Behavior/Cognition: Alert;Cooperative;Pleasant mood Oral Cavity Assessment: Within Functional Limits Oral Care Completed by SLP: Yes Oral Cavity - Dentition: Adequate natural dentition Vision: Functional for self-feeding Patient Positioning: Upright in bed Baseline Vocal Quality: Hoarse;Breathy;Low vocal intensity Volitional Cough: Weak Volitional Swallow: Able to elicit    Oral/Motor/Sensory Function Overall Oral Motor/Sensory Function: Within functional limits   Ice Chips Ice chips: Within functional limits Presentation: Spoon Pharyngeal Phase Impairments: Suspected delayed Swallow;Decreased hyoid-laryngeal movement   Thin Liquid Thin Liquid: Impaired Presentation: Straw Pharyngeal  Phase Impairments: Suspected delayed Swallow;Decreased hyoid-laryngeal movement;Cough - Delayed    Nectar Thick Nectar Thick Liquid: Not tested   Honey Thick Honey Thick Liquid: Not tested   Puree Puree: Impaired Presentation: Spoon Pharyngeal Phase Impairments: Suspected delayed Swallow;Decreased hyoid-laryngeal movement;Cough - Delayed   Solid   GO   Solid: Not tested        Shonna Chock 07/04/2015,10:00 AM  Enriqueta Shutter. Quentin Ore [facility] [place] [phone]

## 2015-07-04 NOTE — Progress Notes (Signed)
Occupational Therapy Evaluation Patient Details Name: [name] MRN: [id number] DOB: Feb 28, 1940 Today's Date: 07/04/2015    History of Present Illness Pt adm with septic shock likely due to UTI. Pt with renal failure and on CVVHD. Pt then with Rt perinephric hematoma that required embolization and pt with VDRF. Cardiac arrest 5/3. Reintubated 5/16. Extubated 5/27. PMH - recent (3/28) rt rotator cuff repair, Lt renal cell carcinoma s/p Lt nephrectomy, prostate cancer s/p XRT, PE in 2008, OSA, HTN, HLD, DM, A fib.   Clinical Impression   PTA, pt independent with ADL and mobility with the exception of recent RTC repair (3/28/0. Pt currently requires Max A +2 for sit - stand transfer and able to tolerate standing @ 2 min with BUE support. Max A with ADL. Pt able to stand x 1 due to fatigue. HR 125. Unsure of O2 Sat due to poor waveform (?desat to mid 80s briefly). Excellent participation. Very motivated to return to PLOF.  Given UE strengthening ex to complete at bed/chair level. Need to get clarification from Dr. Cay Schillings regarding RTC repair protocol for RUE. Pt excellent candidate to participate in rehab at [facility] then transfer to rehab at [facility].     Follow Up Recommendations  Supervision/Assistance - 24 hour;LTACH /CIR   Equipment Recommendations  3 in 1 bedside comode    Recommendations for Other Services Rehab consult     Precautions / Restrictions Precautions Precautions: Fall Type of Shoulder Precautions: RTC repair. unsure of limitations. Sling per wife. Attmepted to contact Dr. Cay Schillings @ [phone]. Need clarification Restrictions Weight Bearing Restrictions:  (unsure about WBS RUE)      Mobility Bed Mobility               General bed mobility comments: Pt OOB in chair. Nsg had used maxisky to mobilize pt  Transfers Overall transfer level: Needs assistance   Transfers: Sit to/from Stand Sit to Stand: +2 physical assistance;Max assist         General transfer  comment: Able to scoot to edge of chair with mod A +2    Balance Overall balance assessment: Needs assistance Sitting-balance support: Bilateral upper extremity supported;Feet supported Sitting balance-Leahy Scale: Fair       Standing balance-Leahy Scale: Poor                              ADL Overall ADL's : Needs assistance/impaired Eating/Feeding: Minimal assistance Eating/Feeding Details (indicate cue type and reason): beginning to eat ice chips. Grooming: Moderate assistance   Upper Body Bathing: Maximal assistance;Sitting   Lower Body Bathing: Maximal assistance   Upper Body Dressing : Maximal assistance;Sitting   Lower Body Dressing: Maximal assistance;Sit to/from stand;+2 for physical assistance               Functional mobility during ADLs: +2 for physical assistance;Maximal assistance;Cueing for safety;Cueing for sequencing (sit - stand +2 MAx. Able to maintain standing @ 2 min with +)       Vision     Perception     Praxis      Pertinent Vitals/Pain Pain Assessment: No/denies pain     Hand Dominance Right   Extremity/Trunk Assessment Upper Extremity Assessment Upper Extremity Assessment: RUE deficits/detail;LUE deficits/detail RUE Deficits / Details: s/p R RTC repair. Need clarification RUE Coordination: decreased gross motor LUE Deficits / Details: generalized weakness/ AROM to @ 90 FF; 60 abd. appears to have limited acpular mobility. L shoulder ROM improves  with gliding and repetition LUE Coordination: decreased gross motor   Lower Extremity Assessment Lower Extremity Assessment: Defer to PT evaluation   Cervical / Trunk Assessment Cervical / Trunk Assessment: Kyphotic   Communication Communication Communication: Other (comment) (soft vocal quality)   Cognition Arousal/Alertness: Awake/alert Behavior During Therapy: WFL for tasks assessed/performed Overall Cognitive Status:  (will further assess)                      General Comments       Exercises Exercises: General Upper Extremity     Shoulder Instructions      Home Living Family/patient expects to be discharged to:: Unsure Living Arrangements: Spouse/significant other Available Help at Discharge: Family;Available 24 hours/day Type of Home: House Home Access: Stairs to enter [employer] of Steps: 3   Home Layout: Multi-level;Able to live on main level with bedroom/bathroom     Bathroom Shower/Tub: Occupational psychologist: Handicapped height     Home Equipment: Other (comment) (lift chair)   Additional Comments: wears cinoressuib hose. States wore foley/leg bag. Has been taken off while in hospital.      Prior Functioning/Environment Level of Independence: Independent        Comments: worked in [employer], [employer] Diagnosis: Generalized weakness   OT Problem List: Decreased strength;Decreased range of motion;Decreased activity tolerance;Impaired balance (sitting and/or standing);Decreased coordination;Decreased safety awareness;Decreased knowledge of use of DME or AE;Decreased knowledge of precautions;Cardiopulmonary status limiting activity;Obesity;Impaired UE functional use;Increased edema   OT Treatment/Interventions: Self-care/ADL training;Therapeutic exercise;Energy conservation;DME and/or AE instruction;Therapeutic activities;Patient/family education;Balance training    OT Goals(Current goals can be found in the care plan section) Acute Rehab OT Goals Patient Stated Goal: get stronger and return home OT Goal Formulation: With patient Time For Goal Achievement: 07/18/15 Potential to Achieve Goals: Good ADL Goals Pt Will Perform Grooming: with supervision;with set-up;sitting Pt Will Perform Upper Body Bathing: with set-up;with supervision;sitting Pt Will Transfer to Toilet: with +2 assist;with min assist;bedside commode;stand pivot transfer Pt/caregiver will Perform Home Exercise Program: Increased  ROM;Increased strength;Right Upper extremity;With Supervision;With written HEP provided (R UE per RTC protocol; LUE ROMa dn strenghtening)  OT Frequency: Min 2X/week   Barriers to D/C:            Co-evaluation PT/OT/SLP Co-Evaluation/Treatment: Yes Reason for Co-Treatment: Complexity of the patient's impairments (multi-system involvement);For patient/therapist safety   OT goals addressed during session: ADL's and self-care      End of Session Equipment Utilized During Treatment: Gait belt;Oxygen (2L) Nurse Communication: Mobility status;Other (comment) (need for clarification regading R RTC repair)  Activity Tolerance: Patient tolerated treatment well Patient left: in chair;with call bell/phone within reach;with family/visitor present   Time: 1128-1203 OT Time Calculation (min): 35 min Charges:  OT General Charges $OT Visit: 1 Procedure OT Evaluation $OT Eval High Complexity: 1 Procedure OT Treatments $Therapeutic Activity: 8-22 mins G-Codes:    Arnetta Odeh,HILLARY July 21, 2015, 12:30 PM   [facility], OTR/L  [phone] July 21, 2015

## 2015-07-04 NOTE — Evaluation (Addendum)
Physical Therapy Re-Evaluation Patient Details Name: [name] MRN: [id number] DOB: May 09, 1940 Today's Date: 07/04/2015   History of Present Illness  Pt adm with septic shock likely due to UTI. Pt with renal failure and on CVVHD. Pt then with Rt perinephric hematoma that required embolization and pt with VDRF. Cardiac arrest 5/3. Reintubated 5/16. Extubated 5/27. PMH - recent (3/28) rt rotator cuff repair, Lt renal cell carcinoma s/p Lt nephrectomy, prostate cancer s/p XRT, PE in 2008, OSA, HTN, HLD, DM, A fib.  Clinical Impression  Pt admitted with above diagnosis. Pt currently with functional limitations due to the deficits listed below (see PT Problem List). Pt was able to stand with PT and OT with +2 mod assist for 2 minutes.  Pt very weak and need to clarify right UE weight bearing status with MD.  Pt progressing and tolerated therapy well today.  Continue acute PT.  Pt will benefit from skilled PT to increase their independence and safety with mobility to allow discharge to the venue listed below.      Follow Up Recommendations LTAC/CIR (possibly in the future depending on progress)    Equipment Recommendations  Other (comment) (To be assessed)    Recommendations for Other Services       Precautions / Restrictions Precautions Precautions: Fall Type of Shoulder Precautions: RTC repair. unsure of limitations. Sling per wife. Attmepted to contact Dr. Cay Schillings @ [phone]. Need clarification Restrictions Weight Bearing Restrictions:  (unsure about WBS RUE)      Mobility  Bed Mobility               General bed mobility comments: Pt OOB in chair. Nsg had used maxisky to mobilize pt  Transfers Overall transfer level: Needs assistance Equipment used:  (put bed in front of pt for pt to use rail to pull up on) Transfers: Sit to/from Stand Sit to Stand: +2 physical assistance;Max assist         General transfer comment: Able to scoot to edge of chair with mod A +2.  Used  pad to assist pt with power up to stand.   Ambulation/Gait                Stairs            Wheelchair Mobility    Modified Rankin (Stroke Patients Only)       Balance Overall balance assessment: Needs assistance Sitting-balance support: Bilateral upper extremity supported;Feet supported Sitting balance-Leahy Scale: Fair Sitting balance - Comments: Pt sat without back support with min assist for up to 5 minutes.    Standing balance support: During functional activity;Single extremity supported Standing balance-Leahy Scale: Poor Standing balance comment: Relieson left UE to balance in standing as well as +2 mod assist to maintain standing. Pt was able to stand 2 minutes with cues for tucking bottom in and for standing upright stance.                              Pertinent Vitals/Pain Pain Assessment: No/denies pain  100% on 2LNC, BP 104/65 pre standing and 97/58 after standing, 104-125 bpm.      Home Living Family/patient expects to be discharged to:: Unsure Living Arrangements: Spouse/significant other Available Help at Discharge: Family;Available 24 hours/day Type of Home: House Home Access: Stairs to enter   [employer] of Steps: 3 Home Layout: Multi-level;Able to live on main level with bedroom/bathroom Home Equipment: Other (comment) (lift chair) Additional  Comments: wears cinoressuib hose. States wore foley/leg bag. Has been taken off while in hospital.    Prior Function Level of Independence: Independent   Gait / Transfers Assistance Needed: amb modified independent without assistive device     Comments: worked in Teacher, adult education   Dominant Hand: Right    Extremity/Trunk Assessment   Upper Extremity Assessment: Defer to OT evaluation RUE Deficits / Details: s/p R RTC repair. Need clarification     LUE Deficits / Details: generalized weakness/ AROM to @ 90 FF; 60 abd. appears to have limited acpular  mobility. L shoulder ROM improves with gliding and repetition   Lower Extremity Assessment: Defer to PT evaluation RLE Deficits / Details: grossly 3/5 LLE Deficits / Details: grossly 3/5  Cervical / Trunk Assessment: Kyphotic  Communication   Communication: Other (comment) (soft vocal quality)  Cognition Arousal/Alertness: Awake/alert Behavior During Therapy: WFL for tasks assessed/performed Overall Cognitive Status:  (will further assess)                      General Comments General comments (skin integrity, edema, etc.): Let nursing know that one of the dressings on his back came off    Exercises General Exercises - Upper Extremity Shoulder Flexion: Left;15 reps;AAROM;Seated;Strengthening Shoulder Horizontal ADduction: AAROM;Left;15 reps;Seated;Strengthening Elbow Flexion: AROM;Left;15 reps;Both Elbow Extension: AROM;Left;15 reps;Both General Exercises - Lower Extremity Ankle Circles/Pumps: AROM;Both;20 reps;Supine Quad Sets: Both;Supine;5 reps;AROM Gluteal Sets: AROM;Both;5 reps;Supine Long Arc Quad: AROM;Both;10 reps;Seated Hip Flexion/Marching: AROM;Both;5 reps;Seated      Assessment/Plan    PT Assessment Patient needs continued PT services  PT Diagnosis Generalized weakness;Difficulty walking   PT Problem List Decreased strength;Decreased range of motion;Decreased activity tolerance;Decreased mobility;Obesity  PT Treatment Interventions DME instruction;Gait training;Functional mobility training;Therapeutic activities;Therapeutic exercise;Balance training;Patient/family education   PT Goals (Current goals can be found in the Care Plan section) Acute Rehab PT Goals Patient Stated Goal: get stronger and return home PT Goal Formulation: With patient Time For Goal Achievement: 07/18/15 Potential to Achieve Goals: Fair    Frequency Min 3X/week   Barriers to discharge Inaccessible home environment lives in trilevel home    Co-evaluation PT/OT/SLP  Co-Evaluation/Treatment: Yes Reason for Co-Treatment: Complexity of the patient's impairments (multi-system involvement) PT goals addressed during session: Mobility/safety with mobility         End of Session Equipment Utilized During Treatment: Gait belt;Oxygen Activity Tolerance: Patient limited by fatigue Patient left: in chair;with call bell/phone within reach;with chair alarm set;with family/visitor present Nurse Communication: Need for lift equipment;Mobility status         Time: 1138-1205 PT Time Calculation (min) (ACUTE ONLY): 27 min   Charges:   PT Evaluation $PT Re-evaluation: 1 Procedure PT Treatments $Therapeutic Activity: 8-22 mins   PT G CodesDenice Paradise 2015/07/16, 3:14 PM [place],PT Acute Rehabilitation [phone] (pager)

## 2015-07-04 NOTE — Progress Notes (Signed)
PULMONARY / CRITICAL CARE MEDICINE   Name: [name] MRN: [id number] DOB: 27-Feb-1940    ADMISSION DATE:  05/31/2015  REFERRING MD:  EDP  CHIEF COMPLAINT:  Hypotension   SUBJECTIVE: Continues to deny any chest pain or pressure. Continuing to have intermittent cough but denies any wheezing.  REVIEW OF SYSTEMS: No headache. No fever, chills, or sweats. No nausea or abdominal pain.  VITAL SIGNS: BP 94/59 mmHg  Pulse 90  Temp(Src) 99.7 F (37.6 C) (Oral)  Resp 20  Ht 5\' 9"  (1.753 m)  Wt 230 lb 9.6 oz (104.6 kg)  BMI 34.04 kg/m2  SpO2 100%  INTAKE / OUTPUT: I/O last 3 completed shifts: In: 2688.6 [I.V.:1472.6; NG/GT:915; IV Piggyback:301] Out: 2351 [Other:2351]  PHYSICAL EXAMINATION: General:  Watching TV with wife at bedside. Sitting up in a chair.  Integument:  Warm & dry. No rash on exposed skin. Bluish discoloration on left foot improving. HEENT:  No scleral icterus. Tacky mucus membranes. NGT in place. Cardiovascular:  Regular rate with irregular rhythm in atrial fibrillation on telemetry. No edema. No appreciable JVD in sitting position.  Pulmonary:  Diminished breath sounds bilateral lung bases but mild improvement. Normal work of breathing on [place] oxygen. Improving voice quality. Abdomen: Soft. Normal bowel sounds. Nondistended. Nontender Neurological:  Moving all 4 extremities equally. Hand grip symmetric. Follows commands. Moving all four limbs equally. Oriented to place, years, season, person, & president.  LABS:  BMET  Recent Labs Lab 07/03/15 0411 07/03/15 1538 07/04/15 0355  NA 137 136 137  K 4.2 4.4 4.4  CL 102 102 102  CO2 27 26 26   BUN 26* 20 48*  CREATININE 1.20 1.05 2.11*  GLUCOSE 151* 131* 177*    Electrolytes  Recent Labs Lab 07/02/15 0345  07/03/15 0411 07/03/15 1538 07/04/15 0355  CALCIUM 8.6*  < > 8.5* 8.5* 8.5*  MG 2.4  --  2.5*  --  2.5*  PHOS 1.8*  < > 3.0 2.2* 3.3  < > = values in this interval not  displayed.  CBC  Recent Labs Lab 07/02/15 0345 07/03/15 0411 07/04/15 0355  WBC 11.2* 10.5 8.6  HGB 8.4* 8.5* 7.9*  HCT 29.5* 29.2* 27.8*  PLT 205 199 228    Coag's No results for input(s): APTT, INR in the last 168 hours.  Sepsis Markers  Recent Labs Lab 07/02/15 0718 07/03/15 0411 07/04/15 0355  PROCALCITON 2.01 2.18 2.81    ABG No results for input(s): PHART, PCO2ART, PO2ART in the last 168 hours.  Liver Enzymes  Recent Labs Lab 07/02/15 1632 07/03/15 0411 07/03/15 1538  ALBUMIN 2.2* 2.2* 2.2*    Cardiac Enzymes No results for input(s): TROPONINI, PROBNP in the last 168 hours.  Glucose  Recent Labs Lab 07/03/15 0751 07/03/15 1207 07/03/15 1601 07/03/15 2006 07/04/15 0020 07/04/15 0356  GLUCAP 118* 152* 108* 180* 163* 153*    Imaging No results found.   STUDIES:  4/26 Renal u/s >> s/p Lt nephrectomy 4/27 CT abd/pelvis >> Rt perinephric hematoma 5/02 CT abd/pelvis >> increased size of hematoma 5/08 Echo >> EF 65 to 70% 5/18 Doppler legs b/l >> Acute DVT Rt gastrocnemius vein, age indeterminate DVT Rt femoral vein and popliteal vein 5/22 Port CXR: Support lines and tubes remain in position. Low lung volumes. Partial silhouetting of left hemidiaphragm without obvious opacity. 5/23 TTE:  Moderate LVH. EF 65-70%. No wall motion abnormalities. LA mildly dilated. No AS or AR. No MR. RV normal in size & function. No effusion. 5/25  LLE Arterial Duplex:  Prelim report. No evidence of stenosis or occluded arteries. Triphasic blood flow throughout left leg.  MICROBIOLOGY: 4/25 Blood >> Klebsiella pneumoniae 4/27 Blood >> negative 5/16 BAL >> oral flora 5/27 Trach Asp >> 5/27 Blood Ctx >>  ANTIBIOTICS: 4/28 Rocephin >> 5/10  SIGNIFICANT EVENTS: 4/25 Admitted with septic shock due to UTI. Significant ARF. 4/30 transferred out of ICU. 5/03 Cardiac arrest, anemia >> coil embolization Rt kidney by IR; PTX post-CPR 5/16 Reintubated due to airway  secretions 5/18 start heparin gtt  LINES/TUBES: Rt IJ HD cath 4/26 - 5/12 ETT 5/3 - 5/13; 5/16 - 5/27 Lt femoral CVL 5/3 - 5/17 Lt chest tube 5/3 - 5/14 Rt chest tube 5/3 - 5/14 Rt IJ tunneled HD cath 5/12  >>  Lt [place] CVL 5/17 >>  NGT 5/8 >> PIV x1  ASSESSMENT / PLAN:  PULMONARY A: Acute Hypoxic Respiratory Failure - Post Cardiac Arrest. Pneumothorax after CPR 5/03 - chest tubes removed 5/14. H/O OSA. Re-intubated 5/16 due to respiratory secretions.  P:  Continuing to wean FiO2 for saturation greater than 94% Incentive spirometer for pulmonary toilette BiPAP when necessary increased work of breathing  CARDIOVASCULAR A: S/P Cardiac Arrest Shock - Hemorrhagic secondary to Rt perinephric hematoma. Questionable cuff pressure readings. No effect w/ trial of Solu-Cortef. Right Gastocnemious vein acute DVT Intermediate age DVT Right femoral & popliteal veins Ischemic Changes Left Foot - Slightly worse. Arterial duplex w/o notable findings.  P:  Levophed to keep MAP > 65 Hold outpt coreg, tricor, lasix, niacin, accupril Continue heparin gtt  Holding on Coumadin at this time Midodrine 10mg  tid  RENAL A:  Acute on Chronic Renal Failure  Hyperkalemia - Resolved. Hypophosphatemia - Resolved.  H/O Left Nephrectomy for RCC  P:  Nephrology following Holding RRT Monitoring electrolytes per nephrology  GASTROINTESTINAL A:  H/O Dysphagia Constipation - Resolved.  P:  Restart Tube Feedings Protonix VT qday Speech eval  HEMATOLOGIC / ONCOLOGIC A: Anemia - Secondary to acute blood loss from Rt perinephric hematoma. Hgb Stable. Leukocytosis - Unclear etiology. Improving. H/O Left RCC - S/P nephrectomy remotely. H/O Anemia  P:  Trending Cell Counts daily w/ CBC Heparin gtt  INFECTIOUS A:  Sepsis Klebsiella Bacteremia - Completed Antibiotic course 5/10.  P:  No antibiotics currently Trending fever curve & leukocytosis Following cultures from  5/27 PCT per algorithm  ENDOCRINE A:  DM - BG controlled.  P:  AccuChecks q4hr Moderate Dose SSI per algorithm  NPH to 10u [place] q12hr  NEUROLOGIC A:  Acute Encephalopathy - Possibly secondary to metabolic encephalopathy & shock. Resolving. Physical Deconditioning  P:  PT/OT consulted Fentanyl IV prn  FAMILY UPDATE: Wife updated at bedside on 5/29 by Dr. Ashok Cordia.  TODAY'S SUMMARY:  [name] w/ h/o Lt renal cell carcinoma s/p Lt nephrectomy, prostate cancer s/p XRT, PE in 2008, OSA, HTN, HLD, DM, A fib. Now with hemorrhagic shock leading to cardiac arrest, VDRF, AKI from Rt perinephric hematoma. Etiology for patient's persistent hypotension is unclear but improving now off sedation/pain medication & CRRT. Awaiting finalization of cultures before starting any antibiotics. Continue ICU monitoring.   I have spent a total of 33 minutes of critical care time today caring for the patient, updating his wife, & reviewing his electronic medical record.  Sonia Baller Ashok Cordia, M.D. [facility] Pulmonary [facility] Critical Care Pager:  [phone] After 3pm or if no response, call [phone]  07/04/2015, 6:56 AM

## 2015-07-04 NOTE — Progress Notes (Signed)
ANTICOAGULATION CONSULT NOTE - Follow-up Consult  Pharmacy Consult for heparin Indication: DVT  No Known Allergies  Patient Measurements: Height: 5\' 9"  (175.3 cm) Weight: 230 lb 9.6 oz (104.6 kg) IBW/kg (Calculated) : 70.7 Heparin Dosing Weight: 97.5 kg  Vital Signs: Temp: 97.9 F (36.6 C) (05/29 1141) Temp Source: Oral (05/29 1141) BP: 94/56 mmHg (05/29 0716) Pulse Rate: 99 (05/29 0716)  Labs:  Recent Labs  07/02/15 0345  07/03/15 0411 07/03/15 1538 07/04/15 0355 07/04/15 1239  HGB 8.4*  --  8.5*  --  7.9*  --   HCT 29.5*  --  29.2*  --  27.8*  --   PLT 205  --  199  --  228  --   HEPARINUNFRC 0.44  --  0.47  --  0.12* 0.39  CREATININE 1.06  < > 1.20 1.05 2.11*  --   < > = values in this interval not displayed.  Estimated Creatinine Clearance: 37.2 mL/min (by C-G formula based on Cr of 2.11).  Assessment: 75 y.o. male admitted on 05/31/2015 with sepsis. Transferred to ICU s/p cardiac arrest with hemorrhagic and septic shock on 5/3  PMH: prostate ca s/p radiation 2011, renal cell ca s/p left nephrectomy, afib/hx of dvt on warfarin  Currently on IV heparin for h/o DVTs. This morning's HL is now therapeutic at 0.39 on heparin 1450 units/hr. No s/s of bleeding noted  Goal of Therapy:  Heparin level 0.3-0.7 units/mL  Monitor platelets by anticoagulation protocol: Yes   Plan:  Continue heparin infusion at 1450 units/hr 8h confirmatory HL Daily heparin level, CBC Monitor S/sx of bleeding  Albertina Parr, PharmD., BCPS Clinical Pharmacist Pager [phone]

## 2015-07-05 ENCOUNTER — Inpatient Hospital Stay (HOSPITAL_COMMUNITY): Payer: BLUE CROSS/BLUE SHIELD

## 2015-07-05 ENCOUNTER — Encounter (HOSPITAL_COMMUNITY): Payer: Self-pay | Admitting: Anesthesiology

## 2015-07-05 ENCOUNTER — Telehealth: Payer: Self-pay | Admitting: *Deleted

## 2015-07-05 ENCOUNTER — Encounter (HOSPITAL_COMMUNITY)
Admission: EM | Disposition: A | Discharge status: Skilled Nursing Facility | Payer: Self-pay | Source: Home / Self Care | Attending: Internal Medicine

## 2015-07-05 DIAGNOSIS — N179 Acute kidney failure, unspecified: Secondary | ICD-10-CM | POA: Insufficient documentation

## 2015-07-05 LAB — CBC
HCT: 26.8 % — ABNORMAL LOW (ref 39.0–52.0)
Hemoglobin: 7.9 g/dL — ABNORMAL LOW (ref 13.0–17.0)
MCH: 27.6 pg (ref 26.0–34.0)
MCHC: 29.5 g/dL — ABNORMAL LOW (ref 30.0–36.0)
MCV: 93.7 fL (ref 78.0–100.0)
PLATELETS: 253 10*3/uL (ref 150–400)
RBC: 2.86 MIL/uL — ABNORMAL LOW (ref 4.22–5.81)
RDW: 19.6 % — AB (ref 11.5–15.5)
WBC: 8.2 10*3/uL (ref 4.0–10.5)

## 2015-07-05 LAB — COMPREHENSIVE METABOLIC PANEL
ALT: 31 U/L (ref 17–63)
AST: 41 U/L (ref 15–41)
Albumin: 2 g/dL — ABNORMAL LOW (ref 3.5–5.0)
Alkaline Phosphatase: 128 U/L — ABNORMAL HIGH (ref 38–126)
Anion gap: 11 (ref 5–15)
BUN: 101 mg/dL — ABNORMAL HIGH (ref 6–20)
CHLORIDE: 102 mmol/L (ref 101–111)
CO2: 23 mmol/L (ref 22–32)
Calcium: 8.6 mg/dL — ABNORMAL LOW (ref 8.9–10.3)
Creatinine, Ser: 3.71 mg/dL — ABNORMAL HIGH (ref 0.61–1.24)
GFR, EST AFRICAN AMERICAN: 17 mL/min — AB (ref >60)
GFR, EST NON AFRICAN AMERICAN: 15 mL/min — AB (ref >60)
Glucose, Bld: 201 mg/dL — ABNORMAL HIGH (ref 65–99)
POTASSIUM: 4.5 mmol/L (ref 3.5–5.1)
Sodium: 136 mmol/L (ref 135–145)
TOTAL PROTEIN: 6.2 g/dL — AB (ref 6.5–8.1)
Total Bilirubin: 1.2 mg/dL (ref 0.3–1.2)

## 2015-07-05 LAB — GLUCOSE, CAPILLARY
GLUCOSE-CAPILLARY: 203 mg/dL — AB (ref 65–99)
GLUCOSE-CAPILLARY: 169 mg/dL — AB (ref 65–99)
Glucose-Capillary: 146 mg/dL — ABNORMAL HIGH (ref 65–99)
Glucose-Capillary: 149 mg/dL — ABNORMAL HIGH (ref 65–99)
Glucose-Capillary: 171 mg/dL — ABNORMAL HIGH (ref 65–99)
Glucose-Capillary: 205 mg/dL — ABNORMAL HIGH (ref 65–99)

## 2015-07-05 LAB — PHOSPHORUS: Phosphorus: 4 mg/dL (ref 2.5–4.6)

## 2015-07-05 LAB — HEPARIN LEVEL (UNFRACTIONATED): Heparin Unfractionated: 0.43 IU/mL (ref 0.30–0.70)

## 2015-07-05 LAB — PARATHYROID HORMONE, INTACT (NO CA): PTH: 65 pg/mL (ref 15–65)

## 2015-07-05 LAB — MAGNESIUM: Magnesium: 2.5 mg/dL — ABNORMAL HIGH (ref 1.7–2.4)

## 2015-07-05 SURGERY — CREATION, TRACHEOSTOMY
Anesthesia: General

## 2015-07-05 MED ORDER — CALCITRIOL 0.25 MCG PO CAPS
0.2500 ug | ORAL_CAPSULE | ORAL | Status: DC
  Filled 2015-07-05: qty 1

## 2015-07-05 MED ORDER — DARBEPOETIN ALFA 200 MCG/0.4ML IJ SOSY
200.0000 ug | PREFILLED_SYRINGE | INTRAMUSCULAR | Status: DC
  Administered 2015-07-05 – 2015-07-26 (×4): 200 ug via INTRAVENOUS
  Filled 2015-07-05 (×4): qty 0.4

## 2015-07-05 MED ORDER — INSULIN NPH (HUMAN) (ISOPHANE) 100 UNIT/ML ~~LOC~~ SUSP
13.0000 [IU] | Freq: Two times a day (BID) | SUBCUTANEOUS | Status: DC
Start: 2015-07-05 — End: 2015-07-05

## 2015-07-05 MED ORDER — RESOURCE THICKENUP CLEAR PO POWD
ORAL | Status: DC | PRN
  Administered 2015-07-05: 22:00:00 via ORAL
  Filled 2015-07-05: qty 125

## 2015-07-05 MED ORDER — HYDROCORTISONE NA SUCCINATE PF 100 MG IJ SOLR
50.0000 mg | Freq: Four times a day (QID) | INTRAMUSCULAR | Status: DC
  Administered 2015-07-05 – 2015-07-11 (×24): 50 mg via INTRAVENOUS
  Filled 2015-07-05 (×4): qty 2
  Filled 2015-07-05 (×10): qty 1
  Filled 2015-07-05: qty 2
  Filled 2015-07-05 (×9): qty 1
  Filled 2015-07-05 (×2): qty 2

## 2015-07-05 MED ORDER — HEPARIN SODIUM (PORCINE) 1000 UNIT/ML DIALYSIS: 40.0000 [IU]/kg | Freq: Once | INTRAMUSCULAR | Status: DC

## 2015-07-05 MED ORDER — PROPOFOL 10 MG/ML IV BOLUS
INTRAVENOUS | Status: AC
  Filled 2015-07-05: qty 20

## 2015-07-05 MED ORDER — SODIUM CHLORIDE 0.9 % IV SOLN: 100.0000 mL | INTRAVENOUS | Status: DC | PRN

## 2015-07-05 MED ORDER — LIDOCAINE-EPINEPHRINE 1 %-1:100000 IJ SOLN
INTRAMUSCULAR | Status: AC
  Filled 2015-07-05: qty 1

## 2015-07-05 MED ORDER — ALTEPLASE 2 MG IJ SOLR: 2.0000 mg | Freq: Once | INTRAMUSCULAR | Status: DC | PRN

## 2015-07-05 MED ORDER — LIDOCAINE HCL (PF) 1 % IJ SOLN: 5.0000 mL | INTRAMUSCULAR | Status: DC | PRN

## 2015-07-05 MED ORDER — HEPARIN SODIUM (PORCINE) 1000 UNIT/ML DIALYSIS: 1000.0000 [IU] | INTRAMUSCULAR | Status: DC | PRN

## 2015-07-05 MED ORDER — INSULIN NPH (HUMAN) (ISOPHANE) 100 UNIT/ML ~~LOC~~ SUSP
13.0000 [IU] | Freq: Two times a day (BID) | SUBCUTANEOUS | Status: DC
  Administered 2015-07-05 – 2015-07-26 (×38): 13 [IU] via SUBCUTANEOUS
  Filled 2015-07-05 (×7): qty 10

## 2015-07-05 MED ORDER — PENTAFLUOROPROP-TETRAFLUOROETH EX AERO: 1.0000 "application " | INHALATION_SPRAY | CUTANEOUS | Status: DC | PRN

## 2015-07-05 MED ORDER — DARBEPOETIN ALFA 200 MCG/0.4ML IJ SOSY
PREFILLED_SYRINGE | INTRAMUSCULAR | Status: AC
  Administered 2015-07-05: 200 ug via INTRAVENOUS
  Filled 2015-07-05: qty 0.4

## 2015-07-05 MED ORDER — CALCITRIOL 1 MCG/ML PO SOLN
0.2500 ug | ORAL | Status: DC
  Administered 2015-07-05 – 2015-07-11 (×4): 0.25 ug
  Filled 2015-07-05 (×5): qty 0.25

## 2015-07-05 MED ORDER — LIDOCAINE-PRILOCAINE 2.5-2.5 % EX CREA: 1.0000 "application " | TOPICAL_CREAM | CUTANEOUS | Status: DC | PRN

## 2015-07-05 NOTE — Care Management Note (Signed)
Case Management Note  Patient Details  Name: [name] MRN: [id number] Date of Birth: 09-02-1940  Subjective/Objective:    Pt originally admitted with septic shock - then found to have perinephric hematoma                 Action/Plan:   07/05/2015  Pt reintubated overnight with continuous sedation and pressor.  Continuous CRRT  06/20/15 Pt remains on CRRT.  Pressor reinitiated over weekend.  CM will continue to follow for discharge needs  06/17/15 Pt remains ventilated on sedation with tube feeds.  Pt remains on CRRT - HD catheter placed in IR 06/17/15.  CM consulted with attending  regarding appropriateness for [facility] referral; pt does not appear to be appropriate for LTACH at this time - will revisit appropriateness next week   06/09/15: Pt is from home with wife - now intubated s/p IR procedure.  CM will continue to monitor for disposition needs   Expected Discharge Date:                  Expected Discharge Plan:  [place]  In-House Referral:  Clinical Social Work  Discharge planning Services  CM Consult  Post Acute Care Choice:    Choice offered to:     DME Arranged:    DME Agency:     HH Arranged:    [place] Agency:     Status of Service:  In process, will continue to follow  Medicare Important Message Given:    Date Medicare IM Given:    Medicare IM give by:    Date Additional Medicare IM Given:    Additional Medicare Important Message give by:     If discussed at [place] of Stay Meetings, dates discussed:    Additional Comments: 07/05/2015  Pt extubated 5/27 to [place].  CRRT stopped - IHD initiated 07/05/15.  IV hep, pressor and tube feeds.  CM received LTACH consult - CM left voicemail for Physician Advisor regarding appropriateness  07/01/15 Pt has plans for trach today in OR.  Pt remains on ventilator with CRRT - sedation and pressors.  06/23/15 CSW consulted for CIR back up plan Maryclare Labrador, RN 07/05/2015, 3:45 PM

## 2015-07-05 NOTE — Telephone Encounter (Signed)
[id number]/tct-wife-elaine for wellness call/ patient is doing better is now off the vent, getting up in the chair and working with physical therapy/ offered prayers and encouragement to the family/Madlynn Lundeen,BSN,RN3,CCM,CN.

## 2015-07-05 NOTE — Progress Notes (Signed)
Patient currently resting on nasal cannula at this time.  Bipap not needed at this time.  Will continue to monitor.

## 2015-07-05 NOTE — Progress Notes (Signed)
PULMONARY / CRITICAL CARE MEDICINE   Name: [name] MRN: [id number] DOB: 11-10-1940    ADMISSION DATE:  05/31/2015  REFERRING MD:  EDP  CHIEF COMPLAINT:  Hypotension   SUBJECTIVE: remains on pressors  VITAL SIGNS: BP 108/91 mmHg  Pulse 82  Temp(Src) 98 F (36.7 C) (Oral)  Resp 18  Ht 5\' 9"  (1.753 m)  Wt 107.2 kg (236 lb 5.3 oz)  BMI 34.88 kg/m2  SpO2 100%  INTAKE / OUTPUT: I/O last 3 completed shifts: In: 3114.3 [I.V.:1219.3; NG/GT:1895] Out: 0   PHYSICAL EXAMINATION: General:  Watching TV with wife at bedside Integument:  Warm & dry HEENT:  No scleral icterus Cardiovascular:  s1 s2IRR Pulmonary:  reduced Abdomen: Soft. Normal bowel sounds. Nondistended. Nontender Neurological:  Moving all 4 extremities equally. Hand grip symmetric. Follows commands. Moving all four limbs equally. Oriented to place, years, season, person, & president.  LABS:  BMET  Recent Labs Lab 07/03/15 1538 07/04/15 0355 07/05/15 0425  NA 136 137 136  K 4.4 4.4 4.5  CL 102 102 102  CO2 26 26 23   BUN 20 48* 101*  CREATININE 1.05 2.11* 3.71*  GLUCOSE 131* 177* 201*    Electrolytes  Recent Labs Lab 07/03/15 0411 07/03/15 1538 07/04/15 0355 07/05/15 0425  CALCIUM 8.5* 8.5* 8.5* 8.6*  MG 2.5*  --  2.5* 2.5*  PHOS 3.0 2.2* 3.3 4.0    CBC  Recent Labs Lab 07/03/15 0411 07/04/15 0355 07/05/15 0425  WBC 10.5 8.6 8.2  HGB 8.5* 7.9* 7.9*  HCT 29.2* 27.8* 26.8*  PLT 199 228 253    Coag's No results for input(s): APTT, INR in the last 168 hours.  Sepsis Markers  Recent Labs Lab 07/02/15 0718 07/03/15 0411 07/04/15 0355  PROCALCITON 2.01 2.18 2.81    ABG No results for input(s): PHART, PCO2ART, PO2ART in the last 168 hours.  Liver Enzymes  Recent Labs Lab 07/03/15 0411 07/03/15 1538 07/05/15 0425  AST  --   --  41  ALT  --   --  31  ALKPHOS  --   --  128*  BILITOT  --   --  1.2  ALBUMIN 2.2* 2.2* 2.0*    Cardiac Enzymes No results for  input(s): TROPONINI, PROBNP in the last 168 hours.  Glucose  Recent Labs Lab 07/04/15 1138 07/04/15 1555 07/04/15 1950 07/05/15 0006 07/05/15 0412 07/05/15 0832  GLUCAP 168* 123* 212* 149* 203* 205*    Imaging No results found.   STUDIES:  4/26 Renal u/s >> s/p Lt nephrectomy 4/27 CT abd/pelvis >> Rt perinephric hematoma 5/02 CT abd/pelvis >> increased size of hematoma 5/08 Echo >> EF 65 to 70% 5/18 Doppler legs b/l >> Acute DVT Rt gastrocnemius vein, age indeterminate DVT Rt femoral vein and popliteal vein 5/22 Port CXR: Support lines and tubes remain in position. Low lung volumes. Partial silhouetting of left hemidiaphragm without obvious opacity. 5/23 TTE:  Moderate LVH. EF 65-70%. No wall motion abnormalities. LA mildly dilated. No AS or AR. No MR. RV normal in size & function. No effusion. 5/25 LLE Arterial Duplex:  Prelim report. No evidence of stenosis or occluded arteries. Triphasic blood flow throughout left leg.  MICROBIOLOGY: 4/25 Blood >> Klebsiella pneumoniae 4/27 Blood >> negative 5/16 BAL >> oral flora 5/27 Trach Asp >> 5/27 Blood Ctx >>  ANTIBIOTICS: 4/28 Rocephin >> 5/10  SIGNIFICANT EVENTS: 4/25 Admitted with septic shock due to UTI. Significant ARF. 4/30 transferred out of ICU. 5/03 Cardiac arrest, anemia >>  coil embolization Rt kidney by IR; PTX post-CPR 5/16 Reintubated due to airway secretions 5/18 start heparin gtt 5/27- extubated 5/28 off cvvhd  LINES/TUBES: Rt IJ HD cath 4/26 - 5/12 ETT 5/3 - 5/13; 5/16 - 5/27 Lt femoral CVL 5/3 - 5/17 Lt chest tube 5/3 - 5/14 Rt chest tube 5/3 - 5/14 Rt IJ tunneled HD cath 5/12  >>  Lt [place] CVL 5/17 >>  NGT 5/8 >> PIV x1  ASSESSMENT / PLAN:  PULMONARY A: Acute Hypoxic Respiratory Failure - Post Cardiac Arrest. Pneumothorax after CPR 5/03 - chest tubes removed 5/14. H/O OSA. Re-intubated 5/16 due to respiratory secretions.  P:  Continuing to wean FiO2 for saturation greater than  94% Incentive spirometer for pulmonary toilette BiPAP prn ,. No needed pcxr in am for volume status  CARDIOVASCULAR A: S/P Cardiac Arrest Shock - Hemorrhagic secondary to Rt perinephric hematoma. Questionable cuff pressure readings. No effect w/ trial of Solu-Cortef. Right Gastocnemious vein acute DVT Intermediate age DVT Right femoral & popliteal veins Ischemic Changes Left Foot - Slightly worse. Arterial duplex w/o notable findings. vasoplegia P:  Levophed to keep MAP >>>55 Repeat cortisol was borderline, would re add stress steroids based on past and continued pressor needs Hold outpt coreg, tricor, lasix, niacin, accupril Continue heparin gtt  Holding on Coumadin Midodrine 10mg  tid  RENAL A:  Acute on Chronic Renal Failure  Hyperkalemia - Resolved. Hypophosphatemia - Resolved.  H/O Left Nephrectomy for RCC  P:  Nephrology following HD , follow BP response to this  GASTROINTESTINAL A:  H/O Dysphagia Constipation - Resolved.  P:  Restart Tube Feedings Protonix VT qday Speech eval today barrium  HEMATOLOGIC / ONCOLOGIC A: Anemia - Secondary to acute blood loss from Rt perinephric hematoma. Hgb Stable. Leukocytosis - Unclear etiology. Improving. H/O Left RCC - S/P nephrectomy remotely. H/O Anemia  P:  Cbc in am  Heparin gtt  INFECTIOUS A:  Sepsis Klebsiella Bacteremia - Completed Antibiotic course 5/10. PCt flat in setting arf P:  No antibiotics facvored Following cultures from 5/27 PCT per algorithm- NOT IMpressed  ENDOCRINE A:  DM - BG controlled.  P:  AccuChecks q4hr Moderate Dose SSI per algorithm  NPH to 10u [place] q12hr to increase  NEUROLOGIC A:  Acute Encephalopathy - Possibly secondary to metabolic encephalopathy & shock. Resolving. Physical Deconditioning  P:  PT/OT consulted Fentanyl IV prn  FAMILY UPDATE: Wife updated at bedside daily by me Ccm time 30 min   Lavon Paganini. Titus Mould, MD, [place] Pgr:  [place] [facility]

## 2015-07-05 NOTE — Progress Notes (Signed)
Subjective: Interval History: has no complaint .  Objective: Vital signs in last 24 hours: Temp:  [97.7 F (36.5 C)-99.3 F (37.4 C)] 99.3 F (37.4 C) (05/30 0411) Pulse Rate:  [62-99] 96 (05/30 0545) Resp:  [17-30] 25 (05/30 0545) BP: (84-124)/(49-73) 110/70 mmHg (05/30 0545) SpO2:  [89 %-100 %] 100 % (05/30 0545) Weight:  [107.2 kg (236 lb 5.3 oz)] 107.2 kg (236 lb 5.3 oz) (05/30 0424) Weight change: 2.6 kg (5 lb 11.7 oz)  Intake/Output from previous day: 05/29 0701 - 05/30 0700 In: 1994.6 [I.V.:804.6; NG/GT:1190] Out: 0  Intake/Output this shift:    General appearance: cooperative, moderately obese and pale Neck: RIJ PC Resp: diminished breath sounds bilaterally and rales bibasilar Cardio: S1, S2 normal and systolic murmur: holosystolic 2/6, blowing at apex GI: soft, pos bs, obese Extremities: edema 1-2+  Lab Results:  Recent Labs  07/04/15 0355 07/05/15 0425  WBC 8.6 8.2  HGB 7.9* 7.9*  HCT 27.8* 26.8*  PLT 228 253   BMET:  Recent Labs  07/04/15 0355 07/05/15 0425  NA 137 136  K 4.4 4.5  CL 102 102  CO2 26 23  GLUCOSE 177* 201*  BUN 48* 101*  CREATININE 2.11* 3.71*  CALCIUM 8.5* 8.6*    Recent Labs  07/04/15 0730  PTH 65   Iron Studies: No results for input(s): IRON, TIBC, TRANSFERRIN, FERRITIN in the last 72 hours.  Studies/Results: No results found.  I have reviewed the patient's current medications.  Assessment/Plan: 1 CKD/AKI suspect at ESRD will try conventional HD today but on pressors so may not go well.  will see if can get solute and acid base improved. 2 Hypotension on NE 11 mcg 3 Renal bleed Hb stable 4 Anemia Fe , esa 5 HPTH mild elevation use Vit D 6 Debill 7 Pneu improved 8 Afib in NSR P HD, esa, vit D, mobilize, wean pressors when poss    LOS: 35 days   Barrett Holthaus L 07/05/2015,7:02 AM

## 2015-07-05 NOTE — Progress Notes (Signed)
ANTICOAGULATION CONSULT NOTE - Follow-up Consult  Pharmacy Consult for heparin Indication: DVT  No Known Allergies  Patient Measurements: Height: 5\' 9"  (175.3 cm) Weight: 236 lb 5.3 oz (107.2 kg) IBW/kg (Calculated) : 70.7 Heparin Dosing Weight: 97.5 kg  Vital Signs: Temp: 98 F (36.7 C) (05/30 0802) Temp Source: Oral (05/30 0802) BP: 113/74 mmHg (05/30 1045) Pulse Rate: 95 (05/30 1045)  Labs:  Recent Labs  07/03/15 0411 07/03/15 1538 07/04/15 0355 07/04/15 1239 07/04/15 2335 07/05/15 0414 07/05/15 0425  HGB 8.5*  --  7.9*  --   --   --  7.9*  HCT 29.2*  --  27.8*  --   --   --  26.8*  PLT 199  --  228  --   --   --  253  HEPARINUNFRC 0.47  --  0.12* 0.39 0.35 0.43  --   CREATININE 1.20 1.05 2.11*  --   --   --  3.71*    Estimated Creatinine Clearance: 21.4 mL/min (by C-G formula based on Cr of 3.71).  Assessment: 75 y.o. male admitted on 05/31/2015 with sepsis. Transferred to ICU s/p cardiac arrest with hemorrhagic and septic shock on 5/3  PMH: prostate ca s/p radiation 2011, renal cell cancer s/p left nephrectomy, afib/hx of dvt on warfarin (on hold this admission)  Currently on IV heparin for acute DVT RLE with a known history of DVTs and atrial fibrillation. This morning's HL remains therapeutic at 0.43 on heparin 1450 units/hr. No bleeding noted.  Hemoglobin and platelet count are stable.  Goal of Therapy:  Heparin level 0.3-0.7 units/mL  Monitor platelets by anticoagulation protocol: Yes   Plan:  Continue heparin infusion at 1450 units/hr Daily heparin level, CBC Monitor S/sx of bleeding  Legrand Como, Pharm.D., BCPS, AAHIVP Clinical Pharmacist Phone: [phone] or [phone] 07/05/2015, 10:54 AM

## 2015-07-05 NOTE — Procedures (Signed)
I was present at this session.  I have reviewed the session itself and made appropriate changes.  HD starting via R IJ cath.  bp 110-120s on NE.    [name] L 5/30/20172:47 PM

## 2015-07-05 NOTE — Progress Notes (Signed)
MBSS complete. Full report located under chart review in imaging section.  Huber [name] Paiewonsky, M.A. CCC-SLP [phone]  

## 2015-07-06 ENCOUNTER — Inpatient Hospital Stay (HOSPITAL_COMMUNITY): Payer: BLUE CROSS/BLUE SHIELD

## 2015-07-06 LAB — RENAL FUNCTION PANEL
Albumin: 2.1 g/dL — ABNORMAL LOW (ref 3.5–5.0)
Anion gap: 11 (ref 5–15)
BUN: 42 mg/dL — ABNORMAL HIGH (ref 6–20)
CO2: 27 mmol/L (ref 22–32)
Calcium: 8.2 mg/dL — ABNORMAL LOW (ref 8.9–10.3)
Chloride: 99 mmol/L — ABNORMAL LOW (ref 101–111)
Creatinine, Ser: 2.68 mg/dL — ABNORMAL HIGH (ref 0.61–1.24)
GFR calc Af Amer: 26 mL/min — ABNORMAL LOW
GFR calc non Af Amer: 22 mL/min — ABNORMAL LOW
Glucose, Bld: 173 mg/dL — ABNORMAL HIGH (ref 65–99)
Phosphorus: 4 mg/dL (ref 2.5–4.6)
Potassium: 3.8 mmol/L (ref 3.5–5.1)
Sodium: 137 mmol/L (ref 135–145)

## 2015-07-06 LAB — GLUCOSE, CAPILLARY
Glucose-Capillary: 146 mg/dL — ABNORMAL HIGH (ref 65–99)
Glucose-Capillary: 150 mg/dL — ABNORMAL HIGH (ref 65–99)
Glucose-Capillary: 152 mg/dL — ABNORMAL HIGH (ref 65–99)
Glucose-Capillary: 176 mg/dL — ABNORMAL HIGH (ref 65–99)
Glucose-Capillary: 177 mg/dL — ABNORMAL HIGH (ref 65–99)
Glucose-Capillary: 185 mg/dL — ABNORMAL HIGH (ref 65–99)

## 2015-07-06 LAB — CBC WITH DIFFERENTIAL/PLATELET
Basophils Absolute: 0 K/uL (ref 0.0–0.1)
Basophils Relative: 0 %
Eosinophils Absolute: 0 K/uL (ref 0.0–0.7)
Eosinophils Relative: 0 %
HCT: 27.6 % — ABNORMAL LOW (ref 39.0–52.0)
Hemoglobin: 8.2 g/dL — ABNORMAL LOW (ref 13.0–17.0)
Lymphocytes Relative: 9 %
Lymphs Abs: 0.8 K/uL (ref 0.7–4.0)
MCH: 27.9 pg (ref 26.0–34.0)
MCHC: 29.7 g/dL — ABNORMAL LOW (ref 30.0–36.0)
MCV: 93.9 fL (ref 78.0–100.0)
Monocytes Absolute: 0.8 K/uL (ref 0.1–1.0)
Monocytes Relative: 9 %
Neutro Abs: 7.4 K/uL (ref 1.7–7.7)
Neutrophils Relative %: 82 %
Platelets: 258 K/uL (ref 150–400)
RBC: 2.94 MIL/uL — ABNORMAL LOW (ref 4.22–5.81)
RDW: 19.6 % — ABNORMAL HIGH (ref 11.5–15.5)
WBC: 9 K/uL (ref 4.0–10.5)

## 2015-07-06 LAB — HEPARIN LEVEL (UNFRACTIONATED): Heparin Unfractionated: 0.51 [IU]/mL (ref 0.30–0.70)

## 2015-07-06 LAB — PROTIME-INR
INR: 1.31 (ref 0.00–1.49)
PROTHROMBIN TIME: 16.5 s — AB (ref 11.6–15.2)

## 2015-07-06 LAB — HEPATITIS B SURFACE ANTIGEN: HEP B S AG: NEGATIVE

## 2015-07-06 MED ORDER — WARFARIN SODIUM 6 MG PO TABS
6.0000 mg | ORAL_TABLET | Freq: Once | ORAL | Status: AC
  Administered 2015-07-06: 6 mg via ORAL
  Filled 2015-07-06: qty 1

## 2015-07-06 MED ORDER — WARFARIN - PHARMACIST DOSING INPATIENT
Freq: Every day | Status: DC
  Administered 2015-07-10 – 2015-07-20 (×6)

## 2015-07-06 NOTE — Progress Notes (Signed)
Occupational Therapy Treatment Patient Details Name: [name] MRN: [id number] DOB: 08-11-1940 Today's Date: 07/06/2015    History of present illness Pt adm with septic shock likely due to UTI. Pt with renal failure and on CVVHD. Pt then with Rt perinephric hematoma that required embolization and pt with VDRF. Cardiac arrest 5/3. Reintubated 5/16. Extubated 5/27. PMH - recent (3/28) rt rotator cuff repair, Lt renal cell carcinoma s/p Lt nephrectomy, prostate cancer s/p XRT, PE in 2008, OSA, HTN, HLD, DM, A fib.   OT comments  Pt progressing with improving activity tolerance.  He tolerated UE and LE exercises well.  PT emailed message for [facility] for clarification re: Rt UE precautions/activity.  Will continue to follow.   Follow Up Recommendations  Supervision/Assistance - 24 hour;LTACH    Equipment Recommendations  3 in 1 bedside comode    Recommendations for Other Services Rehab consult    Precautions / Restrictions Precautions Precautions: Fall Type of Shoulder Precautions: RTC repair. unsure of limitations. Sling per wife. Attmepted to contact Dr. Rich Brave through on call MD. Need clarification Precaution Comments: Lt femoral line, CVVHD in rt IJ Required Braces or Orthoses: Sling Restrictions Weight Bearing Restrictions: No       Mobility Bed Mobility               General bed mobility comments: Pt OOB in chair. Nsg had used maxisky to mobilize pt  Transfers Overall transfer level: Needs assistance Equipment used: Ambulation equipment used (put bed in front of pt for pt to use rail to pull up on) Transfers: Sit to/from Stand Sit to Stand: +2 physical assistance;Max assist         General transfer comment: Able to scoot to edge of chair with mod A +2.  Used pad to assist pt with power up to stand. Attempted to use Clarise Cruz lift however it did not work due to right UE weight bearing restrictions.  Went back to pulling up on bed rail with pt able to power up with  assist of pad however pt with BM therefore nursing came in and assisted with cleaning.  Had to stand 3 times to get pt fully clean.  Pt with flexed posture but could stand taller with cues and assist.  Pt stood 1 min then 1 1/2 min then 1 min.  As he fatigued, posture worsened.      Balance Overall balance assessment: Needs assistance Sitting-balance support: No upper extremity supported;Feet supported Sitting balance-Leahy Scale: Fair Sitting balance - Comments: Pt sat without back support with min guard assist for up to 5 minutes.    Standing balance support: Single extremity supported;During functional activity Standing balance-Leahy Scale: Poor Standing balance comment: Relies on LEft UE to baalnce in standing as well as +2 mod assist to maintain staanding.                     ADL                                                Vision                     Perception     Praxis      Cognition   Behavior During Therapy: WFL for tasks assessed/performed Overall Cognitive Status:  (will further assess)  Extremity/Trunk Assessment               Exercises General Exercises - Upper Extremity Shoulder Flexion: AAROM;Left;10 reps;Seated Shoulder Extension: AROM;Left;10 reps;Seated Shoulder ABduction: AAROM;Left;10 reps;Seated Shoulder ADduction: AAROM;Left;10 reps;Seated Elbow Flexion: AROM;Left;10 reps;Seated Elbow Extension: AROM;Left;10 reps;Seated General Exercises - Lower Extremity Ankle Circles/Pumps: AROM;Right;Left;10 reps;Seated Gluteal Sets: AROM;Both;5 reps;Supine Long Arc Quad: AROM;Both;10 reps;Seated Hip Flexion/Marching: AROM;Right;Left;10 reps;Seated   Shoulder Instructions       General Comments      Pertinent Vitals/ Pain       Pain Assessment: No/denies pain  Home Living                                          Prior Functioning/Environment               Frequency Min 2X/week     Progress Toward Goals  OT Goals(current goals can now be found in the care plan section)  Progress towards OT goals: Progressing toward goals  ADL Goals Pt Will Perform Grooming: with supervision;with set-up;sitting Pt Will Perform Upper Body Bathing: with set-up;with supervision;sitting Pt Will Transfer to Toilet: with +2 assist;with min assist;bedside commode;stand pivot transfer Pt/caregiver will Perform Home Exercise Program: Increased ROM;Increased strength;Right Upper extremity;With Supervision;With written HEP provided  Plan Discharge plan remains appropriate    Co-evaluation                 End of Session Equipment Utilized During Treatment: Oxygen   Activity Tolerance Patient tolerated treatment well   Patient Left in chair;with family/visitor present;with call bell/phone within reach;with chair alarm set   Nurse Communication Mobility status        Time: [id number] OT Time Calculation (min): 29 min  Charges: OT General Charges $OT Visit: 1 Procedure OT Treatments $Therapeutic Exercise: 23-37 mins  Ellamae Lybeck M 07/06/2015, 1:39 PM

## 2015-07-06 NOTE — Care Management Note (Signed)
Case Management Note  Patient Details  Name: [name] MRN: [id number] Date of Birth: July 18, 1940  Subjective/Objective:    Pt originally admitted with septic shock - then found to have perinephric hematoma                 Action/Plan:   07/06/2015  Pt reintubated overnight with continuous sedation and pressor.  Continuous CRRT  06/20/15 Pt remains on CRRT.  Pressor reinitiated over weekend.  CM will continue to follow for discharge needs  06/17/15 Pt remains ventilated on sedation with tube feeds.  Pt remains on CRRT - HD catheter placed in IR 06/17/15.  CM consulted with attending  regarding appropriateness for [facility] referral; pt does not appear to be appropriate for LTACH at this time - will revisit appropriateness next week   06/09/15: Pt is from home with wife - now intubated s/p IR procedure.  CM will continue to monitor for disposition needs   Expected Discharge Date:                  Expected Discharge Plan:  [place]  In-House Referral:  Clinical Social Work  Discharge planning Services  CM Consult  Post Acute Care Choice:    Choice offered to:     DME Arranged:    DME Agency:     HH Arranged:    [place] Agency:     Status of Service:  In process, will continue to follow  Medicare Important Message Given:    Date Medicare IM Given:    Medicare IM give by:    Date Additional Medicare IM Given:    Additional Medicare Important Message give by:     If discussed at [place] of Stay Meetings, dates discussed:    Additional Comments: 07/06/2015  Pt deemed appropriate for LTACH by both Physician Advisor and Attending.  CM spoke with pt and wife and informed of recommended discharge plan to [facility] - offered choice - at pt/wife request - provided referral to both [facility] and [facility] and requested each liason meet with family.  Pt extubated 5/27 to [place].  CRRT stopped - IHD initiated 07/05/15.  IV hep, pressor and tube feeds.  CM received LTACH consult - CM  left voicemail for Physician Advisor regarding appropriateness  07/01/15 Pt has plans for trach today in OR.  Pt remains on ventilator with CRRT - sedation and pressors.  06/23/15 CSW consulted for CIR back up plan Maryclare Labrador, RN 07/06/2015, 1:45 PM

## 2015-07-06 NOTE — Progress Notes (Signed)
Speech Language Pathology Treatment: Dysphagia  Patient Details Name: [name] MRN: [id number] DOB: [birth year] Today's Date: 07/06/2015 Time: [id number] SLP Time Calculation (min) (ACUTE ONLY): 16 min  Assessment / Plan / Recommendation Clinical Impression  Pt seen for dysphagia intervention following MBS 5/30. Pt swished nectar thick in oral cavity and exhibited delays when requested to swallow more swiftly suspected as a compensatory strategy to improve control and cohesion. No overt s/s aspiration present. Breathy vocal quality and compromised respiratory support increase aspiration risk. Pt reporting preferring cold drinks however ice removed from nectar thick milk with explanation to pt/RN/RN tech. Not ready to attempt increased textures this session but moving towards unrestricted texture and liquids.    HPI HPI: 75 yo male with hemorrhagic shock leading to cardiac arrest, VDRF, AKI from Rt perinephric hematoma. He was intubated 5/03-5/13, 5/16-5/27. He has hx of Lt renal cell carcinoma s/p Lt nephrectomy, prostate cancer s/p XRT, PE in 2008, OSA, HTN, HLD, DM, A fib.      SLP Plan  Continue with current plan of care     Recommendations  Diet recommendations: Dysphagia 2 (fine chop);Nectar-thick liquid Liquids provided via: Cup Medication Administration: Other (Comment) (or NG if preferred) Supervision: Patient able to self feed;Full supervision/cueing for compensatory strategies Compensations: Minimize environmental distractions;Slow rate;Small sips/bites;Multiple dry swallows after each bite/sip Postural Changes and/or Swallow Maneuvers: Seated upright 90 degrees             Oral Care Recommendations: Oral care BID Follow up Recommendations: LTACH Plan: Continue with current plan of care     GO                Houston Siren 07/06/2015, 3:23 PM  Orbie Pyo Colvin Caroli.[employer] [phone]

## 2015-07-06 NOTE — Progress Notes (Signed)
Nutrition Follow-up  DOCUMENTATION CODES:   Obesity unspecified  INTERVENTION:    Magic cup TID with meals, each supplement provides 290 kcal and 9 grams of protein  NUTRITION DIAGNOSIS:   Inadequate oral intake related to dysphagia as evidenced by meal completion < 25%.  Ongoing  GOAL:   Patient will meet greater than or equal to 90% of their needs  Unmet  MONITOR:   PO intake, Supplement acceptance, Labs, Weight trends, I & O's   ASSESSMENT:   75 y.o. male with PMH as outlined below including prostate CA s/p radiation 2011 and RCC s/p left nephrectomy. He was seen in ED 4/22 for clogged catheter which was irrigated and replaced. He apparently has had hematuria for quite some time which has been attributed to scarring from prior radiation.  Patient was extubated on 5/27. CRRT d/c'ed on 5/28. Now receiving HD TIW. Likely now at ESRD and will require HD. S/P MBSS with SLP on 5/30, diet advanced to Dysphagia 2 with nectar thick liquids. Patient is consuming 0-20% of meals.  Discussed patient in ICU rounds and with RN today. Still requiring pressors. Plans for d/c to LTACH soon.   Diet Order:  DIET DYS 2 Room service appropriate?: Yes; Fluid consistency:: Nectar Thick  Skin:  Reviewed, no issues  Last BM:  5/30  Height:   Ht Readings from Last 1 Encounters:  06/08/15 5\' 9"  (1.753 m)    Weight:   Wt Readings from Last 1 Encounters:  07/06/15 235 lb 10.8 oz (106.9 kg)    Ideal Body Weight:  72.7 kg  BMI:  Body mass index is 34.79 kg/(m^2).  Estimated Nutritional Needs:   Kcal:  1950-2150  Protein:  110-130 gm  Fluid:  2  L  EDUCATION NEEDS:   No education needs identified at this time  Molli Barrows, [place], [employer], [place] Pager [phone] After Hours Pager [phone]

## 2015-07-06 NOTE — Progress Notes (Signed)
Subjective: Interval History: has no complaint .  Objective: Vital signs in last 24 hours: Temp:  [97.6 F (36.4 C)-99.4 F (37.4 C)] 97.8 F (36.6 C) (05/31 0431) Pulse Rate:  [57-104] 78 (05/31 0700) Resp:  [18-30] 22 (05/31 0700) BP: (80-124)/(57-91) 117/73 mmHg (05/31 0700) SpO2:  [88 %-100 %] 100 % (05/31 0700) Weight:  [106.9 kg (235 lb 10.8 oz)-109.1 kg (240 lb 8.4 oz)] 106.9 kg (235 lb 10.8 oz) (05/31 0403) Weight change: 1.9 kg (4 lb 3 oz)  Intake/Output from previous day: 05/30 0701 - 05/31 0700 In: 1230.4 [P.O.:20; I.V.:810.4; NG/GT:400] Out: 1505  Intake/Output this shift:    General appearance: alert, cooperative, moderately obese and pale Resp: diminished breath sounds LLL and rales LLL Chest wall: RIJ PC Cardio: S1, S2 normal and systolic murmur: holosystolic 2/6, blowing at apex GI: soft, pos bs, liver down 5 cm Extremitie1+extremities normal, atraumatic, no cyanosis or edema and edema 1+  Lab Results:  Recent Labs  07/05/15 0425 07/06/15 0445  WBC 8.2 9.0  HGB 7.9* 8.2*  HCT 26.8* 27.6*  PLT 253 258   BMET:  Recent Labs  07/05/15 0425 07/06/15 0445  NA 136 137  K 4.5 3.8  CL 102 99*  CO2 23 27  GLUCOSE 201* 173*  BUN 101* 42*  CREATININE 3.71* 2.68*  CALCIUM 8.6* 8.2*    Recent Labs  07/04/15 0730  PTH 65   Iron Studies: No results for input(s): IRON, TIBC, TRANSFERRIN, FERRITIN in the last 72 hours.  Studies/Results: Dg Swallowing Func-speech Pathology  07/05/2015  Objective Swallowing Evaluation: Type of Study: MBS-Modified Barium Swallow Study Patient Details Name: INGRID YANIK MRN: [id number] Date of Birth: 1940-04-30 Today's Date: 07/05/2015 Time: SLP Start Time (ACUTE ONLY): 1051-SLP Stop Time (ACUTE ONLY): 1114 SLP Time Calculation (min) (ACUTE ONLY): 23 min Past Medical History: Past Medical History Diagnosis Date . Obesity  . Phlebitis    Lower extermity . Pulmonary emboli ([place]) 2008   submassive, saddle . Prostate cancer ([place])  07/2009 . Sleep apnea    on CPAP . Hx of echocardiogram 12/04/2010   Normal EF >55% no significant valve disease . History of stress test 06/27/2009   Low risk and EF of approximately 50% . DVT (deep venous thrombosis) ([place])  . Chronic kidney disease, stage 3    baseline creatinine ~1.4 . HLD (hyperlipidemia)  . HTN (hypertension)  . Dysrhythmia    A fib . Diabetes mellitus ([place])    diet controlled . History of hiatal hernia  Past Surgical History: Past Surgical History Procedure Laterality Date . Nephrectomy  1999   for CA . Cholecystectomy  1999 . Transurethral resection of bladder tumor N/A 03/04/2013   Procedure: CYSTOSCOPY WITH RIGHT RETROGRADE PYELOGRAM AND BLADDER BIOPSY /CLOT EVACUATION/ BIOPSY PROSTATIC URETHRA WITH FULGERATION ;  Surgeon: Molli Hazard, MD;  Location: [facility];  Service: Urology;  Laterality: N/A; . Cystoscopy w/ retrogrades N/A 04/08/2013   Procedure: CYSTOSCOPY WITH CLOT EVACUATION;  Surgeon: Molli Hazard, MD;  Location: [facility];  Service: Urology;  Laterality: N/A; . Left shoulder repair   . Shoulder open rotator cuff repair Right 05/03/2015   Procedure: RIGHT SHOULDER ROTATOR CUFF REPAIR OPEN WITH GRAFT AND ANCHOR ;  Surgeon: Latanya Maudlin, MD;  Location: [facility];  Service: Orthopedics;  Laterality: Right; HPI: 75 yo male with hemorrhagic shock leading to cardiac arrest, VDRF, AKI from Rt perinephric hematoma. He was intubated 5/03-5/13, 5/16-5/27. He has hx of Lt renal cell carcinoma s/p Lt  nephrectomy, prostate cancer s/p XRT, PE in 2008, OSA, HTN, HLD, DM, A fib. Subjective: pt alert, pleasant Assessment / Plan / Recommendation CHL IP CLINICAL IMPRESSIONS 07/05/2015 Therapy Diagnosis Mild oral phase dysphagia;Moderate pharyngeal phase dysphagia Clinical Impression Pt has a mild-moderate oropharyngeal dysphagia with prolonged intubation and generalized deconditioning resulting in sensorimotor deficits. Oral phase is mildly prolonged with reduced bolus cohesion but ultimately  with adequate oral clearance. Thin liquids reach the pyriform sinuses prior to swallow trigger and are subsequently aspirated before the swallow. This does elicit a reflexive cough, but it is not sufficient to expel aspirates. Min cues for smaller boluses of thin liquids results in silent penetration, which does clear with cued throat clearing before it can fall beneath the glottis. He has good airway protection with nectar thick liquids and solids, despite larger quantities. Reduced hyolaryngeal movement, epiglottic inversion, and base of tongue retraction result in moderate pharyngeal residue, but this is reduced with Min cues for second swallows. Given the above, would recommend more conservative diet of Dys 2 textures and nectar thick liquids. Good prognosis for diet advancement with increased time post-extubation and improved overall strength. Impact on safety and function Mild aspiration risk   CHL IP TREATMENT RECOMMENDATION 07/05/2015 Treatment Recommendations Therapy as outlined in treatment plan below   Prognosis 07/05/2015 Prognosis for Safe Diet Advancement Good Barriers to Reach Goals -- Barriers/Prognosis Comment -- CHL IP DIET RECOMMENDATION 07/05/2015 SLP Diet Recommendations Dysphagia 2 (Fine chop) solids;Nectar thick liquid Liquid Administration via Cup;Straw Medication Administration Crushed with puree Compensations Minimize environmental distractions;Slow rate;Small sips/bites;Multiple dry swallows after each bite/sip Postural Changes Remain semi-upright after after feeds/meals (Comment);Seated upright at 90 degrees   CHL IP OTHER RECOMMENDATIONS 07/05/2015 Recommended Consults -- Oral Care Recommendations Oral care BID Other Recommendations Order thickener from pharmacy;Prohibited food (jello, ice cream, thin soups);Remove water pitcher   CHL IP FOLLOW UP RECOMMENDATIONS 07/05/2015 Follow up Recommendations LTACH   CHL IP FREQUENCY AND DURATION 07/05/2015 Speech Therapy Frequency (ACUTE ONLY) min  2x/week Treatment Duration 2 weeks      CHL IP ORAL PHASE 07/05/2015 Oral Phase Impaired Oral - Pudding Teaspoon -- Oral - Pudding Cup -- Oral - Honey Teaspoon -- Oral - Honey Cup -- Oral - Nectar Teaspoon Delayed oral transit;Decreased bolus cohesion Oral - Nectar Cup Delayed oral transit;Decreased bolus cohesion Oral - Nectar Straw Delayed oral transit;Decreased bolus cohesion Oral - Thin Teaspoon Delayed oral transit;Decreased bolus cohesion Oral - Thin Cup Delayed oral transit;Decreased bolus cohesion Oral - Thin Straw -- Oral - Puree Delayed oral transit;Decreased bolus cohesion Oral - Mech Soft Delayed oral transit;Decreased bolus cohesion;Impaired mastication Oral - Regular -- Oral - Multi-Consistency -- Oral - Pill -- Oral Phase - Comment --  CHL IP PHARYNGEAL PHASE 07/05/2015 Pharyngeal Phase Impaired Pharyngeal- Pudding Teaspoon -- Pharyngeal -- Pharyngeal- Pudding Cup -- Pharyngeal -- Pharyngeal- Honey Teaspoon -- Pharyngeal -- Pharyngeal- Honey Cup -- Pharyngeal -- Pharyngeal- Nectar Teaspoon Delayed swallow initiation-vallecula;Reduced epiglottic inversion;Reduced anterior laryngeal mobility;Reduced laryngeal elevation;Reduced tongue base retraction;Pharyngeal residue - valleculae Pharyngeal -- Pharyngeal- Nectar Cup Delayed swallow initiation-vallecula;Reduced epiglottic inversion;Reduced anterior laryngeal mobility;Reduced laryngeal elevation;Reduced tongue base retraction;Pharyngeal residue - valleculae Pharyngeal -- Pharyngeal- Nectar Straw Delayed swallow initiation-vallecula;Reduced epiglottic inversion;Reduced anterior laryngeal mobility;Reduced laryngeal elevation;Reduced tongue base retraction;Pharyngeal residue - valleculae Pharyngeal -- Pharyngeal- Thin Teaspoon Delayed swallow initiation-vallecula;Reduced epiglottic inversion;Reduced anterior laryngeal mobility;Reduced laryngeal elevation;Reduced tongue base retraction;Pharyngeal residue - valleculae Pharyngeal -- Pharyngeal- Thin Cup Reduced  epiglottic inversion;Reduced anterior laryngeal mobility;Reduced laryngeal elevation;Reduced tongue base retraction;Pharyngeal residue - valleculae;Delayed  swallow initiation-pyriform sinuses;Penetration/Aspiration before swallow Pharyngeal Material enters airway, passes BELOW cords and not ejected out despite cough attempt by patient Pharyngeal- Thin Straw -- Pharyngeal -- Pharyngeal- Puree Delayed swallow initiation-vallecula;Reduced epiglottic inversion;Reduced anterior laryngeal mobility;Reduced laryngeal elevation;Reduced tongue base retraction;Pharyngeal residue - valleculae Pharyngeal -- Pharyngeal- Mechanical Soft Delayed swallow initiation-vallecula;Reduced epiglottic inversion;Reduced anterior laryngeal mobility;Reduced laryngeal elevation;Reduced tongue base retraction;Pharyngeal residue - valleculae Pharyngeal -- Pharyngeal- Regular -- Pharyngeal -- Pharyngeal- Multi-consistency -- Pharyngeal -- Pharyngeal- Pill -- Pharyngeal -- Pharyngeal Comment --  CHL IP CERVICAL ESOPHAGEAL PHASE 07/05/2015 Cervical Esophageal Phase WFL Pudding Teaspoon -- Pudding Cup -- Honey Teaspoon -- Honey Cup -- Nectar Teaspoon -- Nectar Cup -- Nectar Straw -- Thin Teaspoon -- Thin Cup -- Thin Straw -- Puree -- Mechanical Soft -- Regular -- Multi-consistency -- Pill -- Cervical Esophageal Comment -- No flowsheet data found. Germain Osgood, M.A. CCC-SLP [phone] Germain Osgood 07/05/2015, 12:06 PM               I have reviewed the patient's current medications.  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  Will do HD tiw.  Small chance of any function.  Did well on HD 2 Low bp still on NE, slow wean 3 Anemia esa/Fe 4 HPTH vit D 5 Renal bleed stable 6 Pneu resolved P save arm, HD in am, esa, Vit D    LOS: 36 days   Claudis Giovanelli L 07/06/2015,7:02 AM

## 2015-07-06 NOTE — Progress Notes (Signed)
Physical Therapy Treatment Patient Details Name: [name] MRN: [id number] DOB: 1940-12-23 Today's Date: 07/06/2015    History of Present Illness Pt adm with septic shock likely due to UTI. Pt with renal failure and on CVVHD. Pt then with Rt perinephric hematoma that required embolization and pt with VDRF. Cardiac arrest 5/3. Reintubated 5/16. Extubated 5/27. PMH - recent (3/28) rt rotator cuff repair, Lt renal cell carcinoma s/p Lt nephrectomy, prostate cancer s/p XRT, PE in 2008, OSA, HTN, HLD, DM, A fib.    PT Comments    Pt admitted with above diagnosis. Pt currently with functional limitations due to balance and endurance deficits. Pt was able to stand x 3 with +2 mod to max assist.  Used on call MD email to try and get response from MD regarding pts right UE restrictions and weight bearing status.  Progressing slowly.  Will continue acute PT.   Pt will benefit from skilled PT to increase their independence and safety with mobility to allow discharge to the venue listed below.    Follow Up Recommendations  CIR;LTACH (possibly in the future depending on progress)     Equipment Recommendations  Other (comment) (To be assessed)    Recommendations for Other Services       Precautions / Restrictions Precautions Precautions: Fall Type of Shoulder Precautions: RTC repair. unsure of limitations. Sling per wife. Attmepted to contact Dr. Rich Brave through on call MD. Need clarification Restrictions Weight Bearing Restrictions: No    Mobility  Bed Mobility               General bed mobility comments: Pt OOB in chair. Nsg had used maxisky to mobilize pt  Transfers Overall transfer level: Needs assistance Equipment used: Ambulation equipment used (put bed in front of pt for pt to use rail to pull up on) Transfers: Sit to/from Stand Sit to Stand: +2 physical assistance;Max assist         General transfer comment: Able to scoot to edge of chair with mod A +2.  Used pad to  assist pt with power up to stand. Attempted to use Clarise Cruz lift however it did not work due to right UE weight bearing restrictions.  Went back to pulling up on bed rail with pt able to power up with assist of pad however pt with BM therefore nursing came in and assisted with cleaning.  Had to stand 3 times to get pt fully clean.  Pt with flexed posture but could stand taller with cues and assist.  Pt stood 1 min then 1 1/2 min then 1 min.  As he fatigued, posture worsened.    Ambulation/Gait                 Stairs            Wheelchair Mobility    Modified Rankin (Stroke Patients Only)       Balance Overall balance assessment: Needs assistance Sitting-balance support: No upper extremity supported;Feet supported Sitting balance-Leahy Scale: Fair Sitting balance - Comments: Pt sat without back support with min guard assist for up to 5 minutes.    Standing balance support: Single extremity supported;During functional activity Standing balance-Leahy Scale: Poor Standing balance comment: Relies on LEft UE to baalnce in standing as well as +2 mod assist to maintain staanding.                      Cognition Arousal/Alertness: Awake/alert Behavior During Therapy: WFL for tasks assessed/performed Overall  Cognitive Status:  (will further assess)                      Exercises General Exercises - Lower Extremity Ankle Circles/Pumps: AROM;Both;20 reps;Supine Gluteal Sets: AROM;Both;5 reps;Supine    General Comments General comments (skin integrity, edema, etc.): nursing changed pts sacral dressing.        Pertinent Vitals/Pain Pain Assessment: No/denies pain  85-100 bpm, 72/61 on arrival, Nursing incr meds and up to 82/51 fairly quickly.  116/57 at end of treatment.  98% on RA.     Home Living                      Prior Function            PT Goals (current goals can now be found in the care plan section) Progress towards PT goals: Progressing  toward goals    Frequency  Min 3X/week    PT Plan Current plan remains appropriate    Co-evaluation             End of Session Equipment Utilized During Treatment: Gait belt;Oxygen Activity Tolerance: Patient limited by fatigue Patient left: in chair;with call bell/phone within reach;with chair alarm set;with family/visitor present     Time: 1025-1059 PT Time Calculation (min) (ACUTE ONLY): 34 min  Charges:  $Therapeutic Activity: 23-37 mins                    G CodesDenice Paradise 07-24-2015, 12:21 PM [place] Modesta Sammons,PT Acute Rehabilitation [phone] (pager)

## 2015-07-06 NOTE — Progress Notes (Signed)
PULMONARY / CRITICAL CARE MEDICINE   Name: [name] MRN: [id number] DOB: September 21, 1940    ADMISSION DATE:  05/31/2015  REFERRING MD:  EDP  CHIEF COMPLAINT:  Hypotension   SUBJECTIVE: remains on pressors 10 mics, tolerated neg 1.5 liters, this am BP 130/70  VITAL SIGNS: BP 117/73 mmHg  Pulse 78  Temp(Src) 97.8 F (36.6 C) (Oral)  Resp 22  Ht 5\' 9"  (1.753 m)  Wt 106.9 kg (235 lb 10.8 oz)  BMI 34.79 kg/m2  SpO2 100%  INTAKE / OUTPUT: I/O last 3 completed shifts: In: 2198.4 [P.O.:20; I.V.:1188.4; NG/GT:990] Out: [id number] [Other:1505]  PHYSICAL EXAMINATION: General:  Watching TV in chair Integument:  Warm & dry HEENT:  No scleral icterus Cardiovascular:  s1 s2IRR Pulmonary:  Reduced to clear Abdomen: Soft. Normal bowel sounds. Nondistended. Nontender Neurological:  Moving all 4 extremities equally. Hand grip symmetric. Follows commands. Moving all four limbs equally. Oriented to place, years, season, person, & president.  LABS:  BMET  Recent Labs Lab 07/04/15 0355 07/05/15 0425 07/06/15 0445  NA 137 136 137  K 4.4 4.5 3.8  CL 102 102 99*  CO2 26 23 27   BUN 48* 101* 42*  CREATININE 2.11* 3.71* 2.68*  GLUCOSE 177* 201* 173*    Electrolytes  Recent Labs Lab 07/03/15 0411  07/04/15 0355 07/05/15 0425 07/06/15 0445  CALCIUM 8.5*  < > 8.5* 8.6* 8.2*  MG 2.5*  --  2.5* 2.5*  --   PHOS 3.0  < > 3.3 4.0 4.0  < > = values in this interval not displayed.  CBC  Recent Labs Lab 07/04/15 0355 07/05/15 0425 07/06/15 0445  WBC 8.6 8.2 9.0  HGB 7.9* 7.9* 8.2*  HCT 27.8* 26.8* 27.6*  PLT 228 253 258    Coag's No results for input(s): APTT, INR in the last 168 hours.  Sepsis Markers  Recent Labs Lab 07/02/15 0718 07/03/15 0411 07/04/15 0355  PROCALCITON 2.01 2.18 2.81    ABG No results for input(s): PHART, PCO2ART, PO2ART in the last 168 hours.  Liver Enzymes  Recent Labs Lab 07/03/15 1538 07/05/15 0425 07/06/15 0445  AST  --  41  --    ALT  --  31  --   ALKPHOS  --  128*  --   BILITOT  --  1.2  --   ALBUMIN 2.2* 2.0* 2.1*    Cardiac Enzymes No results for input(s): TROPONINI, PROBNP in the last 168 hours.  Glucose  Recent Labs Lab 07/05/15 0832 07/05/15 1207 07/05/15 1600 07/05/15 2000 07/06/15 0014 07/06/15 0429  GLUCAP 205* 171* 146* 169* 176* 185*    Imaging Dg Chest Port 1 View  07/06/2015  CLINICAL DATA:  Acute renal failure. EXAM: PORTABLE CHEST 1 VIEW COMPARISON:  06/27/2015 . FINDINGS: Interim extubation. Dialysis catheter and left subclavian central line in stable position. Feeding tube in stable position. Stable cardiomegaly. Persistent left base subsegmental atelectasis and or infiltrate with slight interim progression. No pleural effusion or pneumothorax. IMPRESSION: 1. Interim extubation. Right dialysis catheter left subclavian central line stable position. Feeding tube in stable position. 2. Slight interim progression of left base atelectasis and or infiltrate. Electronically Signed   By: Marcello Moores  Register   On: 07/06/2015 07:39   Dg Swallowing Func-speech Pathology  07/05/2015  Objective Swallowing Evaluation: Type of Study: MBS-Modified Barium Swallow Study Patient Details Name: [name] MRN: [id number] Date of Birth: May 24, 1940 Today's Date: 07/05/2015 Time: SLP Start Time (ACUTE ONLY): 1051-SLP Stop Time (ACUTE ONLY): [id number]  SLP Time Calculation (min) (ACUTE ONLY): 23 min Past Medical History: Past Medical History Diagnosis Date . Obesity  . Phlebitis    Lower extermity . Pulmonary emboli ([place]) 2008   submassive, saddle . Prostate cancer ([place]) 07/2009 . Sleep apnea    on CPAP . Hx of echocardiogram 12/04/2010   Normal EF >55% no significant valve disease . History of stress test 06/27/2009   Low risk and EF of approximately 50% . DVT (deep venous thrombosis) ([place])  . Chronic kidney disease, stage 3    baseline creatinine ~1.4 . HLD (hyperlipidemia)  . HTN (hypertension)  . Dysrhythmia    A fib .  Diabetes mellitus ([place])    diet controlled . History of hiatal hernia  Past Surgical History: Past Surgical History Procedure Laterality Date . Nephrectomy  1999   for CA . Cholecystectomy  1999 . Transurethral resection of bladder tumor N/A 03/04/2013   Procedure: CYSTOSCOPY WITH RIGHT RETROGRADE PYELOGRAM AND BLADDER BIOPSY /CLOT EVACUATION/ BIOPSY PROSTATIC URETHRA WITH FULGERATION ;  Surgeon: Molli Hazard, MD;  Location: [facility];  Service: Urology;  Laterality: N/A; . Cystoscopy w/ retrogrades N/A 04/08/2013   Procedure: CYSTOSCOPY WITH CLOT EVACUATION;  Surgeon: Molli Hazard, MD;  Location: [facility];  Service: Urology;  Laterality: N/A; . Left shoulder repair   . Shoulder open rotator cuff repair Right 05/03/2015   Procedure: RIGHT SHOULDER ROTATOR CUFF REPAIR OPEN WITH GRAFT AND ANCHOR ;  Surgeon: Latanya Maudlin, MD;  Location: [facility];  Service: Orthopedics;  Laterality: Right; HPI: 75 yo male with hemorrhagic shock leading to cardiac arrest, VDRF, AKI from Rt perinephric hematoma. He was intubated 5/03-5/13, 5/16-5/27. He has hx of Lt renal cell carcinoma s/p Lt nephrectomy, prostate cancer s/p XRT, PE in 2008, OSA, HTN, HLD, DM, A fib. Subjective: pt alert, pleasant Assessment / Plan / Recommendation CHL IP CLINICAL IMPRESSIONS 07/05/2015 Therapy Diagnosis Mild oral phase dysphagia;Moderate pharyngeal phase dysphagia Clinical Impression Pt has a mild-moderate oropharyngeal dysphagia with prolonged intubation and generalized deconditioning resulting in sensorimotor deficits. Oral phase is mildly prolonged with reduced bolus cohesion but ultimately with adequate oral clearance. Thin liquids reach the pyriform sinuses prior to swallow trigger and are subsequently aspirated before the swallow. This does elicit a reflexive cough, but it is not sufficient to expel aspirates. Min cues for smaller boluses of thin liquids results in silent penetration, which does clear with cued throat clearing before it  can fall beneath the glottis. He has good airway protection with nectar thick liquids and solids, despite larger quantities. Reduced hyolaryngeal movement, epiglottic inversion, and base of tongue retraction result in moderate pharyngeal residue, but this is reduced with Min cues for second swallows. Given the above, would recommend more conservative diet of Dys 2 textures and nectar thick liquids. Good prognosis for diet advancement with increased time post-extubation and improved overall strength. Impact on safety and function Mild aspiration risk   CHL IP TREATMENT RECOMMENDATION 07/05/2015 Treatment Recommendations Therapy as outlined in treatment plan below   Prognosis 07/05/2015 Prognosis for Safe Diet Advancement Good Barriers to Reach Goals -- Barriers/Prognosis Comment -- CHL IP DIET RECOMMENDATION 07/05/2015 SLP Diet Recommendations Dysphagia 2 (Fine chop) solids;Nectar thick liquid Liquid Administration via Cup;Straw Medication Administration Crushed with puree Compensations Minimize environmental distractions;Slow rate;Small sips/bites;Multiple dry swallows after each bite/sip Postural Changes Remain semi-upright after after feeds/meals (Comment);Seated upright at 90 degrees   CHL IP OTHER RECOMMENDATIONS 07/05/2015 Recommended Consults -- Oral Care Recommendations Oral care BID Other Recommendations Order thickener  from pharmacy;Prohibited food (jello, ice cream, thin soups);Remove water pitcher   CHL IP FOLLOW UP RECOMMENDATIONS 07/05/2015 Follow up Recommendations LTACH   CHL IP FREQUENCY AND DURATION 07/05/2015 Speech Therapy Frequency (ACUTE ONLY) min 2x/week Treatment Duration 2 weeks      CHL IP ORAL PHASE 07/05/2015 Oral Phase Impaired Oral - Pudding Teaspoon -- Oral - Pudding Cup -- Oral - Honey Teaspoon -- Oral - Honey Cup -- Oral - Nectar Teaspoon Delayed oral transit;Decreased bolus cohesion Oral - Nectar Cup Delayed oral transit;Decreased bolus cohesion Oral - Nectar Straw Delayed oral  transit;Decreased bolus cohesion Oral - Thin Teaspoon Delayed oral transit;Decreased bolus cohesion Oral - Thin Cup Delayed oral transit;Decreased bolus cohesion Oral - Thin Straw -- Oral - Puree Delayed oral transit;Decreased bolus cohesion Oral - Mech Soft Delayed oral transit;Decreased bolus cohesion;Impaired mastication Oral - Regular -- Oral - Multi-Consistency -- Oral - Pill -- Oral Phase - Comment --  CHL IP PHARYNGEAL PHASE 07/05/2015 Pharyngeal Phase Impaired Pharyngeal- Pudding Teaspoon -- Pharyngeal -- Pharyngeal- Pudding Cup -- Pharyngeal -- Pharyngeal- Honey Teaspoon -- Pharyngeal -- Pharyngeal- Honey Cup -- Pharyngeal -- Pharyngeal- Nectar Teaspoon Delayed swallow initiation-vallecula;Reduced epiglottic inversion;Reduced anterior laryngeal mobility;Reduced laryngeal elevation;Reduced tongue base retraction;Pharyngeal residue - valleculae Pharyngeal -- Pharyngeal- Nectar Cup Delayed swallow initiation-vallecula;Reduced epiglottic inversion;Reduced anterior laryngeal mobility;Reduced laryngeal elevation;Reduced tongue base retraction;Pharyngeal residue - valleculae Pharyngeal -- Pharyngeal- Nectar Straw Delayed swallow initiation-vallecula;Reduced epiglottic inversion;Reduced anterior laryngeal mobility;Reduced laryngeal elevation;Reduced tongue base retraction;Pharyngeal residue - valleculae Pharyngeal -- Pharyngeal- Thin Teaspoon Delayed swallow initiation-vallecula;Reduced epiglottic inversion;Reduced anterior laryngeal mobility;Reduced laryngeal elevation;Reduced tongue base retraction;Pharyngeal residue - valleculae Pharyngeal -- Pharyngeal- Thin Cup Reduced epiglottic inversion;Reduced anterior laryngeal mobility;Reduced laryngeal elevation;Reduced tongue base retraction;Pharyngeal residue - valleculae;Delayed swallow initiation-pyriform sinuses;Penetration/Aspiration before swallow Pharyngeal Material enters airway, passes BELOW cords and not ejected out despite cough attempt by patient Pharyngeal-  Thin Straw -- Pharyngeal -- Pharyngeal- Puree Delayed swallow initiation-vallecula;Reduced epiglottic inversion;Reduced anterior laryngeal mobility;Reduced laryngeal elevation;Reduced tongue base retraction;Pharyngeal residue - valleculae Pharyngeal -- Pharyngeal- Mechanical Soft Delayed swallow initiation-vallecula;Reduced epiglottic inversion;Reduced anterior laryngeal mobility;Reduced laryngeal elevation;Reduced tongue base retraction;Pharyngeal residue - valleculae Pharyngeal -- Pharyngeal- Regular -- Pharyngeal -- Pharyngeal- Multi-consistency -- Pharyngeal -- Pharyngeal- Pill -- Pharyngeal -- Pharyngeal Comment --  CHL IP CERVICAL ESOPHAGEAL PHASE 07/05/2015 Cervical Esophageal Phase WFL Pudding Teaspoon -- Pudding Cup -- Honey Teaspoon -- Honey Cup -- Nectar Teaspoon -- Nectar Cup -- Nectar Straw -- Thin Teaspoon -- Thin Cup -- Thin Straw -- Puree -- Mechanical Soft -- Regular -- Multi-consistency -- Pill -- Cervical Esophageal Comment -- No flowsheet data found. Germain Osgood, M.A. CCC-SLP [phone] Germain Osgood 07/05/2015, 12:06 PM                STUDIES:  4/26 Renal u/s >> s/p Lt nephrectomy 4/27 CT abd/pelvis >> Rt perinephric hematoma 5/02 CT abd/pelvis >> increased size of hematoma 5/08 Echo >> EF 65 to 70% 5/18 Doppler legs b/l >> Acute DVT Rt gastrocnemius vein, age indeterminate DVT Rt femoral vein and popliteal vein 5/22 Port CXR: Support lines and tubes remain in position. Low lung volumes. Partial silhouetting of left hemidiaphragm without obvious opacity. 5/23 TTE:  Moderate LVH. EF 65-70%. No wall motion abnormalities. LA mildly dilated. No AS or AR. No MR. RV normal in size & function. No effusion. 5/25 LLE Arterial Duplex:  Prelim report. No evidence of stenosis or occluded arteries. Triphasic blood flow throughout left leg.  MICROBIOLOGY: 4/25 Blood >> Klebsiella pneumoniae 4/27 Blood >> negative 5/16 BAL >> oral  flora 5/27 Trach Asp >> 5/27 Blood Ctx  >>  ANTIBIOTICS: 4/28 Rocephin >> 5/10  SIGNIFICANT EVENTS: 4/25 Admitted with septic shock due to UTI. Significant ARF. 4/30 transferred out of ICU. 5/03 Cardiac arrest, anemia >> coil embolization Rt kidney by IR; PTX post-CPR 5/16 Reintubated due to airway secretions 5/18 start heparin gtt 5/27- extubated 5/28 off cvvhd  LINES/TUBES: Rt IJ HD cath 4/26 - 5/12 ETT 5/3 - 5/13; 5/16 - 5/27 Lt femoral CVL 5/3 - 5/17 Lt chest tube 5/3 - 5/14 Rt chest tube 5/3 - 5/14 Rt IJ tunneled HD cath 5/12  >>  Lt [place] CVL 5/17 >>  NGT 5/8 >> PIV x1  ASSESSMENT / PLAN:  PULMONARY A: Acute Hypoxic Respiratory Failure - Post Cardiac Arrest. Pneumothorax after CPR 5/03 - chest tubes removed 5/14. H/O OSA. Re-intubated 5/16 due to respiratory secretions. ATX left base P:  In a chair Ambulate as able  CARDIOVASCULAR A: S/P Cardiac Arrest Shock - Hemorrhagic secondary to Rt perinephric hematoma. Questionable cuff pressure readings. No effect w/ trial of Solu-Cortef. Right Gastocnemious vein acute DVT Intermediate age DVT Right femoral & popliteal veins Ischemic Changes Left Foot - Slightly worse. Arterial duplex w/o notable findings. vasoplegia P:  Levophed to keep MAP >>>50 or sys 85 Maintain stress roids No florinef with esrd Continue heparin gtt  Holding on Coumadin Cbc in am  Midodrine 10mg  tid If remains on levo greater 4-5 mics by noon , add vaso  RENAL A:  Acute on Chronic Renal Failure  Hyperkalemia - Resolved. Hypophosphatemia - Resolved.  H/O Left Nephrectomy for RCC  P:  Nephrology following HD tid  GASTROINTESTINAL A:  H/O Dysphagia Constipation - Resolved.  P:  diet Protonix VT qday Speech eval today barrium - pass Follow intake then dc cortrak if up to 60% intake  HEMATOLOGIC / ONCOLOGIC A: Anemia - Secondary to acute blood loss from Rt perinephric hematoma. Hgb Stable. Leukocytosis - Unclear etiology. Improving. H/O Left RCC - S/P  nephrectomy remotely. H/O Anemia  P:  Cbc in am  Heparin gtt, consider coumadin load  INFECTIOUS A:  Sepsis Klebsiella Bacteremia - Completed Antibiotic course 5/10. PCt flat in setting arf P:  Follow fever curve  ENDOCRINE A:  DM - BG controlled. Presume rel AI P:  AccuChecks q4hr Moderate Dose SSI per algorithm  NPH 13 bid Stress roids  NEUROLOGIC A:  Acute Encephalopathy - Possibly secondary to metabolic encephalopathy & shock. Resolving. Physical Deconditioning  P:  PT/OT consulted In chair mobilize Fentanyl IV prn  FAMILY UPDATE: Wife updated daily Ccm time 30 min   Lavon Paganini. Titus Mould, MD, [place] Pgr: [facility]

## 2015-07-06 NOTE — Progress Notes (Signed)
ANTICOAGULATION CONSULT NOTE - Follow-up Consult  Pharmacy Consult for heparin and warfarin Indication: DVT  No Known Allergies  Patient Measurements: Height: 5\' 9"  (175.3 cm) Weight: 235 lb 10.8 oz (106.9 kg) IBW/kg (Calculated) : 70.7 Heparin Dosing Weight: 97.5 kg  Vital Signs: Temp: 98.8 F (37.1 C) (05/31 0808) Temp Source: Oral (05/31 0808) BP: 110/57 mmHg (05/31 1100) Pulse Rate: 88 (05/31 1100)  Labs:  Recent Labs  07/04/15 0355  07/04/15 2335 07/05/15 0414 07/05/15 0425 07/06/15 0445  HGB 7.9*  --   --   --  7.9* 8.2*  HCT 27.8*  --   --   --  26.8* 27.6*  PLT 228  --   --   --  253 258  HEPARINUNFRC 0.12*  < > 0.35 0.43  --  0.51  CREATININE 2.11*  --   --   --  3.71* 2.68*  < > = values in this interval not displayed.  Estimated Creatinine Clearance: 29.6 mL/min (by C-G formula based on Cr of 2.68).  Assessment: 75 y.o. male admitted on 05/31/2015 with sepsis. Transferred to ICU s/p cardiac arrest with hemorrhagic and septic shock on 5/3  PMH: prostate ca s/p radiation 2011, renal cell cancer s/p left nephrectomy, afib/hx of dvt on warfarin (home dose 6 mg MWF and 5 mg all other days)  Currently on IV heparin for acute DVT RLE with a known history of DVTs and atrial fibrillation and resuming warfarin. This morning's HL remains therapeutic at 0.51 on heparin 1450 units/hr. INR 1.31. No bleeding noted.  Hemoglobin and platelet count are stable.   Goal of Therapy:  Heparin level 0.3-0.7 units/mL  Monitor platelets by anticoagulation protocol: Yes   Plan:  Continue heparin infusion at 1450 units/hr Daily heparin level, CBC Restart Warfarin 6 mg po x1 today.  Daily PT/INR Monitor S/sx of bleeding  Sloan Leiter, PharmD, BCPS Clinical Pharmacist [phone]  07/06/2015, 12:31 PM

## 2015-07-07 ENCOUNTER — Inpatient Hospital Stay (HOSPITAL_COMMUNITY): Payer: BLUE CROSS/BLUE SHIELD

## 2015-07-07 LAB — RENAL FUNCTION PANEL
ALBUMIN: 2.1 g/dL — AB (ref 3.5–5.0)
ANION GAP: 12 (ref 5–15)
BUN: 69 mg/dL — AB (ref 6–20)
CO2: 26 mmol/L (ref 22–32)
Calcium: 8.4 mg/dL — ABNORMAL LOW (ref 8.9–10.3)
Chloride: 98 mmol/L — ABNORMAL LOW (ref 101–111)
Creatinine, Ser: 4.27 mg/dL — ABNORMAL HIGH (ref 0.61–1.24)
GFR, EST AFRICAN AMERICAN: 15 mL/min — AB (ref >60)
GFR, EST NON AFRICAN AMERICAN: 13 mL/min — AB (ref >60)
Glucose, Bld: 113 mg/dL — ABNORMAL HIGH (ref 65–99)
PHOSPHORUS: 5.2 mg/dL — AB (ref 2.5–4.6)
POTASSIUM: 3.7 mmol/L (ref 3.5–5.1)
Sodium: 136 mmol/L (ref 135–145)

## 2015-07-07 LAB — CBC WITH DIFFERENTIAL/PLATELET
Basophils Absolute: 0 10*3/uL (ref 0.0–0.1)
Basophils Relative: 0 %
Eosinophils Absolute: 0 10*3/uL (ref 0.0–0.7)
Eosinophils Relative: 0 %
HEMATOCRIT: 25.2 % — AB (ref 39.0–52.0)
HEMOGLOBIN: 7.6 g/dL — AB (ref 13.0–17.0)
LYMPHS ABS: 0.7 10*3/uL (ref 0.7–4.0)
LYMPHS PCT: 9 %
MCH: 28.3 pg (ref 26.0–34.0)
MCHC: 30.2 g/dL (ref 30.0–36.0)
MCV: 93.7 fL (ref 78.0–100.0)
Monocytes Absolute: 0.7 10*3/uL (ref 0.1–1.0)
Monocytes Relative: 8 %
NEUTROS PCT: 83 %
Neutro Abs: 6.4 10*3/uL (ref 1.7–7.7)
Platelets: 264 10*3/uL (ref 150–400)
RBC: 2.69 MIL/uL — AB (ref 4.22–5.81)
RDW: 19.7 % — ABNORMAL HIGH (ref 11.5–15.5)
WBC: 7.7 10*3/uL (ref 4.0–10.5)

## 2015-07-07 LAB — GLUCOSE, CAPILLARY
GLUCOSE-CAPILLARY: 102 mg/dL — AB (ref 65–99)
GLUCOSE-CAPILLARY: 103 mg/dL — AB (ref 65–99)
GLUCOSE-CAPILLARY: 135 mg/dL — AB (ref 65–99)
GLUCOSE-CAPILLARY: 139 mg/dL — AB (ref 65–99)
Glucose-Capillary: 142 mg/dL — ABNORMAL HIGH (ref 65–99)
Glucose-Capillary: 181 mg/dL — ABNORMAL HIGH (ref 65–99)
Glucose-Capillary: 165 mg/dL — ABNORMAL HIGH (ref 65–99)

## 2015-07-07 LAB — CULTURE, BLOOD (ROUTINE X 2)
CULTURE: NO GROWTH
CULTURE: NO GROWTH

## 2015-07-07 LAB — CULTURE, RESPIRATORY: CULTURE: NORMAL

## 2015-07-07 LAB — C DIFFICILE QUICK SCREEN W PCR REFLEX
C DIFFICILE (CDIFF) INTERP: NEGATIVE
C Diff antigen: NEGATIVE
C Diff toxin: NEGATIVE

## 2015-07-07 LAB — CULTURE, RESPIRATORY W GRAM STAIN: Special Requests: NORMAL

## 2015-07-07 LAB — HEPARIN LEVEL (UNFRACTIONATED): Heparin Unfractionated: 0.55 IU/mL (ref 0.30–0.70)

## 2015-07-07 LAB — PROTIME-INR
INR: 1.41 (ref 0.00–1.49)
Prothrombin Time: 17.4 seconds — ABNORMAL HIGH (ref 11.6–15.2)

## 2015-07-07 MED ORDER — WARFARIN SODIUM 5 MG PO TABS
5.0000 mg | ORAL_TABLET | Freq: Once | ORAL | Status: AC
  Administered 2015-07-07: 5 mg via ORAL
  Filled 2015-07-07: qty 1

## 2015-07-07 MED ORDER — HEPARIN SODIUM (PORCINE) 1000 UNIT/ML DIALYSIS
1000.0000 [IU] | INTRAMUSCULAR | Status: DC | PRN
  Filled 2015-07-07: qty 1

## 2015-07-07 MED ORDER — HEPARIN SODIUM (PORCINE) 1000 UNIT/ML DIALYSIS
40.0000 [IU]/kg | Freq: Once | INTRAMUSCULAR | Status: DC
  Filled 2015-07-07: qty 5

## 2015-07-07 MED ORDER — SODIUM CHLORIDE 0.9 % IV SOLN: 100.0000 mL | INTRAVENOUS | Status: DC | PRN

## 2015-07-07 MED ORDER — LIDOCAINE-PRILOCAINE 2.5-2.5 % EX CREA
1.0000 "application " | TOPICAL_CREAM | CUTANEOUS | Status: DC | PRN
  Filled 2015-07-07: qty 5

## 2015-07-07 MED ORDER — SUCROFERRIC OXYHYDROXIDE 500 MG PO CHEW
500.0000 mg | CHEWABLE_TABLET | Freq: Three times a day (TID) | ORAL | Status: DC
  Administered 2015-07-07 – 2015-07-10 (×11): 500 mg via ORAL
  Filled 2015-07-07 (×14): qty 1

## 2015-07-07 MED ORDER — DEXTROSE 5 % IV SOLN
0.0000 ug/min | INTRAVENOUS | Status: DC
  Filled 2015-07-07: qty 4

## 2015-07-07 MED ORDER — ALTEPLASE 2 MG IJ SOLR
2.0000 mg | Freq: Once | INTRAMUSCULAR | Status: DC | PRN
  Filled 2015-07-07: qty 2

## 2015-07-07 MED ORDER — LIDOCAINE HCL (PF) 1 % IJ SOLN: 5.0000 mL | INTRAMUSCULAR | Status: DC | PRN

## 2015-07-07 MED ORDER — PENTAFLUOROPROP-TETRAFLUOROETH EX AERO: 1.0000 "application " | INHALATION_SPRAY | CUTANEOUS | Status: DC | PRN

## 2015-07-07 NOTE — Progress Notes (Deleted)
OT Cancellation Note  Patient Details Name: [name] MRN: [id number] DOB: Sep 07, 1940   Cancelled Treatment:    Reason Eval/Treat Not Completed: Medical issues which prohibited therapy. Pt reintubated 5/31. Sedated. On pressors. Will follow up tomorrow.   [place], OTR/L  [id number] 07/07/2015 07/07/2015, 12:36 PM

## 2015-07-07 NOTE — Progress Notes (Signed)
Subjective: Interval History: has no complaint .  Objective: Vital signs in last 24 hours: Temp:  [97.5 F (36.4 C)-98.8 F (37.1 C)] 97.6 F (36.4 C) (06/01 0350) Pulse Rate:  [45-90] 62 (06/01 0700) Resp:  [16-26] 20 (06/01 0700) BP: (68-131)/(33-76) 85/59 mmHg (06/01 0700) SpO2:  [85 %-100 %] 98 % (06/01 0700) Weight:  [107.5 kg (236 lb 15.9 oz)] 107.5 kg (236 lb 15.9 oz) (06/01 0500) Weight change: -1.6 kg (-3 lb 8.4 oz)  Intake/Output from previous day: 05/31 0701 - 06/01 0700 In: 943.5 [P.O.:340; I.V.:603.5] Out: -  Intake/Output this shift:    General appearance: alert, cooperative, no distress and moderately obese Resp: diminished breath sounds bilaterally and rales LLL Chest wall: RIJ cath Cardio: S1, S2 normal and systolic murmur: holosystolic 2/6, blowing at apex GI: obese, pos bs, liver down 4 cm Extremities: edema 1+  Lab Results:  Recent Labs  07/06/15 0445 07/07/15 0525  WBC 9.0 7.7  HGB 8.2* 7.6*  HCT 27.6* 25.2*  PLT 258 264   BMET:  Recent Labs  07/06/15 0445 07/07/15 0525  NA 137 136  K 3.8 3.7  CL 99* 98*  CO2 27 26  GLUCOSE 173* 113*  BUN 42* 69*  CREATININE 2.68* 4.27*  CALCIUM 8.2* 8.4*    Recent Labs  07/04/15 0730  PTH 65   Iron Studies: No results for input(s): IRON, TIBC, TRANSFERRIN, FERRITIN in the last 72 hours.  Studies/Results: Dg Chest Port 1 View  07/06/2015  CLINICAL DATA:  Acute renal failure. EXAM: PORTABLE CHEST 1 VIEW COMPARISON:  06/27/2015 . FINDINGS: Interim extubation. Dialysis catheter and left subclavian central line in stable position. Feeding tube in stable position. Stable cardiomegaly. Persistent left base subsegmental atelectasis and or infiltrate with slight interim progression. No pleural effusion or pneumothorax. IMPRESSION: 1. Interim extubation. Right dialysis catheter left subclavian central line stable position. Feeding tube in stable position. 2. Slight interim progression of left base  atelectasis and or infiltrate. Electronically Signed   By: [place]   On: 07/06/2015 07:39   Dg Swallowing Func-speech Pathology  07/05/2015  Objective Swallowing Evaluation: Type of Study: MBS-Modified Barium Swallow Study Patient Details Name: [name] MRN: [id number] Date of Birth: 1940/12/23 Today's Date: 07/05/2015 Time: SLP Start Time (ACUTE ONLY): 1051-SLP Stop Time (ACUTE ONLY): 1114 SLP Time Calculation (min) (ACUTE ONLY): 23 min Past Medical History: Past Medical History Diagnosis Date . Obesity  . Phlebitis    Lower extermity . Pulmonary emboli ([place]) 2008   submassive, saddle . Prostate cancer ([place]) 07/2009 . Sleep apnea    on CPAP . Hx of echocardiogram 12/04/2010   Normal EF >55% no significant valve disease . History of stress test 06/27/2009   Low risk and EF of approximately 50% . DVT (deep venous thrombosis) ([place])  . Chronic kidney disease, stage 3    baseline creatinine ~1.4 . HLD (hyperlipidemia)  . HTN (hypertension)  . Dysrhythmia    A fib . Diabetes mellitus ([place])    diet controlled . History of hiatal hernia  Past Surgical History: Past Surgical History Procedure Laterality Date . Nephrectomy  1999   for CA . Cholecystectomy  1999 . Transurethral resection of bladder tumor N/A 03/04/2013   Procedure: CYSTOSCOPY WITH RIGHT RETROGRADE PYELOGRAM AND BLADDER BIOPSY /CLOT EVACUATION/ BIOPSY PROSTATIC URETHRA WITH FULGERATION ;  Surgeon: Molli Hazard, MD;  Location: [facility];  Service: Urology;  Laterality: N/A; . Cystoscopy w/ retrogrades N/A 04/08/2013   Procedure: CYSTOSCOPY WITH CLOT  EVACUATION;  Surgeon: Molli Hazard, MD;  Location: [facility];  Service: Urology;  Laterality: N/A; . Left shoulder repair   . Shoulder open rotator cuff repair Right 05/03/2015   Procedure: RIGHT SHOULDER ROTATOR CUFF REPAIR OPEN WITH GRAFT AND ANCHOR ;  Surgeon: Latanya Maudlin, MD;  Location: [facility];  Service: Orthopedics;  Laterality: Right; HPI: 76 yo male with hemorrhagic shock leading to  cardiac arrest, VDRF, AKI from Rt perinephric hematoma. He was intubated 5/03-5/13, 5/16-5/27. He has hx of Lt renal cell carcinoma s/p Lt nephrectomy, prostate cancer s/p XRT, PE in 2008, OSA, HTN, HLD, DM, A fib. Subjective: pt alert, pleasant Assessment / Plan / Recommendation CHL IP CLINICAL IMPRESSIONS 07/05/2015 Therapy Diagnosis Mild oral phase dysphagia;Moderate pharyngeal phase dysphagia Clinical Impression Pt has a mild-moderate oropharyngeal dysphagia with prolonged intubation and generalized deconditioning resulting in sensorimotor deficits. Oral phase is mildly prolonged with reduced bolus cohesion but ultimately with adequate oral clearance. Thin liquids reach the pyriform sinuses prior to swallow trigger and are subsequently aspirated before the swallow. This does elicit a reflexive cough, but it is not sufficient to expel aspirates. Min cues for smaller boluses of thin liquids results in silent penetration, which does clear with cued throat clearing before it can fall beneath the glottis. He has good airway protection with nectar thick liquids and solids, despite larger quantities. Reduced hyolaryngeal movement, epiglottic inversion, and base of tongue retraction result in moderate pharyngeal residue, but this is reduced with Min cues for second swallows. Given the above, would recommend more conservative diet of Dys 2 textures and nectar thick liquids. Good prognosis for diet advancement with increased time post-extubation and improved overall strength. Impact on safety and function Mild aspiration risk   CHL IP TREATMENT RECOMMENDATION 07/05/2015 Treatment Recommendations Therapy as outlined in treatment plan below   Prognosis 07/05/2015 Prognosis for Safe Diet Advancement Good Barriers to Reach Goals -- Barriers/Prognosis Comment -- CHL IP DIET RECOMMENDATION 07/05/2015 SLP Diet Recommendations Dysphagia 2 (Fine chop) solids;Nectar thick liquid Liquid Administration via Cup;Straw Medication  Administration Crushed with puree Compensations Minimize environmental distractions;Slow rate;Small sips/bites;Multiple dry swallows after each bite/sip Postural Changes Remain semi-upright after after feeds/meals (Comment);Seated upright at 90 degrees   CHL IP OTHER RECOMMENDATIONS 07/05/2015 Recommended Consults -- Oral Care Recommendations Oral care BID Other Recommendations Order thickener from pharmacy;Prohibited food (jello, ice cream, thin soups);Remove water pitcher   CHL IP FOLLOW UP RECOMMENDATIONS 07/05/2015 Follow up Recommendations LTACH   CHL IP FREQUENCY AND DURATION 07/05/2015 Speech Therapy Frequency (ACUTE ONLY) min 2x/week Treatment Duration 2 weeks      CHL IP ORAL PHASE 07/05/2015 Oral Phase Impaired Oral - Pudding Teaspoon -- Oral - Pudding Cup -- Oral - Honey Teaspoon -- Oral - Honey Cup -- Oral - Nectar Teaspoon Delayed oral transit;Decreased bolus cohesion Oral - Nectar Cup Delayed oral transit;Decreased bolus cohesion Oral - Nectar Straw Delayed oral transit;Decreased bolus cohesion Oral - Thin Teaspoon Delayed oral transit;Decreased bolus cohesion Oral - Thin Cup Delayed oral transit;Decreased bolus cohesion Oral - Thin Straw -- Oral - Puree Delayed oral transit;Decreased bolus cohesion Oral - Mech Soft Delayed oral transit;Decreased bolus cohesion;Impaired mastication Oral - Regular -- Oral - Multi-Consistency -- Oral - Pill -- Oral Phase - Comment --  CHL IP PHARYNGEAL PHASE 07/05/2015 Pharyngeal Phase Impaired Pharyngeal- Pudding Teaspoon -- Pharyngeal -- Pharyngeal- Pudding Cup -- Pharyngeal -- Pharyngeal- Honey Teaspoon -- Pharyngeal -- Pharyngeal- Honey Cup -- Pharyngeal -- Pharyngeal- Nectar Teaspoon Delayed swallow initiation-vallecula;Reduced epiglottic inversion;Reduced anterior laryngeal  mobility;Reduced laryngeal elevation;Reduced tongue base retraction;Pharyngeal residue - valleculae Pharyngeal -- Pharyngeal- Nectar Cup Delayed swallow initiation-vallecula;Reduced epiglottic  inversion;Reduced anterior laryngeal mobility;Reduced laryngeal elevation;Reduced tongue base retraction;Pharyngeal residue - valleculae Pharyngeal -- Pharyngeal- Nectar Straw Delayed swallow initiation-vallecula;Reduced epiglottic inversion;Reduced anterior laryngeal mobility;Reduced laryngeal elevation;Reduced tongue base retraction;Pharyngeal residue - valleculae Pharyngeal -- Pharyngeal- Thin Teaspoon Delayed swallow initiation-vallecula;Reduced epiglottic inversion;Reduced anterior laryngeal mobility;Reduced laryngeal elevation;Reduced tongue base retraction;Pharyngeal residue - valleculae Pharyngeal -- Pharyngeal- Thin Cup Reduced epiglottic inversion;Reduced anterior laryngeal mobility;Reduced laryngeal elevation;Reduced tongue base retraction;Pharyngeal residue - valleculae;Delayed swallow initiation-pyriform sinuses;Penetration/Aspiration before swallow Pharyngeal Material enters airway, passes BELOW cords and not ejected out despite cough attempt by patient Pharyngeal- Thin Straw -- Pharyngeal -- Pharyngeal- Puree Delayed swallow initiation-vallecula;Reduced epiglottic inversion;Reduced anterior laryngeal mobility;Reduced laryngeal elevation;Reduced tongue base retraction;Pharyngeal residue - valleculae Pharyngeal -- Pharyngeal- Mechanical Soft Delayed swallow initiation-vallecula;Reduced epiglottic inversion;Reduced anterior laryngeal mobility;Reduced laryngeal elevation;Reduced tongue base retraction;Pharyngeal residue - valleculae Pharyngeal -- Pharyngeal- Regular -- Pharyngeal -- Pharyngeal- Multi-consistency -- Pharyngeal -- Pharyngeal- Pill -- Pharyngeal -- Pharyngeal Comment --  CHL IP CERVICAL ESOPHAGEAL PHASE 07/05/2015 Cervical Esophageal Phase WFL Pudding Teaspoon -- Pudding Cup -- Honey Teaspoon -- Honey Cup -- Nectar Teaspoon -- Nectar Cup -- Nectar Straw -- Thin Teaspoon -- Thin Cup -- Thin Straw -- Puree -- Mechanical Soft -- Regular -- Multi-consistency -- Pill -- Cervical Esophageal Comment  -- No flowsheet data found. Germain Osgood, M.A. CCC-SLP [phone] Germain Osgood 07/05/2015, 12:06 PM               I have reviewed the patient's current medications.  Assessment/Plan: 1 CKD/AKI no urine or function. Will do Hd today, concern of bps. He is tol now . Will minimize vol off. 2 anemia esa/Fe 3 Renal bleed 4 DM stable 5 Pneu resolved 6 Obesity 7 Hx Afib 8 HPTH vit D 9 Low bps P HD, follow bps, esa, Fe, vit D    LOS: 37 days   Cassady Turano L 07/07/2015,7:07 AM

## 2015-07-07 NOTE — Progress Notes (Signed)
Dialysis treatment completed.  1000 mL ultrafiltrated.  500 mL net fluid removal.  Patient status unchanged. Lung sounds clear and diminished to ausculation in all fields. Moderate BLE edema. Cardiac: Afib.  Cleansed RIJ catheter with chlorhexidine.  Disconnected lines and flushed ports with saline per protocol.  Ports locked with heparin and capped per protocol.    Report given to bedside, RN April.

## 2015-07-07 NOTE — Discharge Summary (Signed)
Interim Discharge Summary       Patient ID: [name] MRN: [id number] DOB/AGE: [birth year] 75 y.o.  Admit date: 05/31/2015 Discharge date: 07/07/2015  Discharge Diagnoses:  Cardiac arrest Acute Hypoxic Respiratory Failure Pneumothorax (s/p CPR) H/o obstructive Sleep Apnea  Resolved Hemorrhagic shock Right Perinephritic hematoma  S/p left nephrectomy  Right Gastrocnemius DVT Age undetermined DVT of right femoral and popliteal veins Ischemic changes of right foot Vasoplegia  Acute on chronic Renal Failure; now ESRD Hyperkalemia Hypophosphatemia  Dysphagia  Diarrhea  Anemia  Acute blood loss anemia  Leukocytosis  Sepsis  Klebsiella bacteremia  DM Acute Encephalopathy  Severe physical deconditioning  Protein calorie malnutrition.   Detailed Hospital Course:    [name] is a 75 y.o. male with PMH as outlined below including prostate CA s/p radiation 2011 and RCC s/p left nephrectomy. He was seen in ED 4/22 for clogged catheter which was irrigated and replaced. He apparently had hematuria for quite some time which has been attributed to scarring from prior radiation. He had been in his USOH until roughly 4/20 when he had decreased appetite. He had not been eating or drinking for the past 5 days. On 04/25 he went to have his INR checked (on coumadin for A.fib and hx DVT / PE) and was found to have INR of 8 and was hypotensive with SBP 70/44. He was subsequently sent to ED for further evaluation.   In ED, he was found to have UTI and despite 4.5L IVF, he remained hypotensive. He also had multiple metabolic derangements including Na 124, K 5.0, AGMA, lactate 3, SCr 9.36. PCCM was therefore consulted for admission of septic shock due to urosepsis as well as AKI. He was treated in the usual fashion which included: IV hydration, antibiotics and vaso-active gtts. His hospital course was long and complicated by hemorrhagic shock leading to cardiac arrest, VDRF, AKI from Rt  perinephric hematoma. The high-lights of his hospital course were as follows: 4/25 Admitted with septic shock due to UTI. Significant ARF. Blood cultures positive for Klebsiella. 4/30 transferred out of ICU. 5/03 Cardiac arrest, anemia >> coil embolization Rt kidney by IR; PTX post-CPR. CT placed 5/14 chest tube removed 5/16 Reintubated due to airway secretions 5/18 start heparin gtt 5/27- extubated 5/28 off cvvhd  As of 6/2 he is making slow but steady improvement. His biggest issues at this point are profound deconditioning, severe protein calorie malnutrition, and ESRD requiring HD. He continues to have low blood pressure in the 80s to 90s but has no evidence of end-organ hypoperfusion w/ this so we have accepted this goal and held antihypertensives as well as been giving midodrine. He is currently on heparin gtt given DVT. At some point he will need to be transitioned to coumadin, but at this point have held off d/t his nutritional status. The hope is that he will eventually be able to go to LTAC to continue his high level care and to meet his rehabilitation goals   Discharge Plan by active problems   H/O OSA. Atelectasis Resolved respiratory failure Plan:  Continuing to wean FiO2 for saturation greater than 94% Incentive spirometer for pulmonary toilette Volume removal per HD   On-going hypotension;  Right Gastocnemious vein acute DVT Intermediate age DVT Right femoral & popliteal veins Ischemic Changes Left Foot - Slightly worse. Arterial duplex w/o notable findings. vasoplegia Plan:  Cont Midodrine  Accept MAP >55/ SBP goal 85-90 Stress dose steroids w/ plan for slow taper  Hold outpt coreg,  tricor, lasix, niacin, accupril Continue heparin gtt / coumadin    Acute on Chronic Renal Failure  H/O Left Nephrectomy for RCC  Plan:  Nephrology following F/u labs   H/O Dysphagia Constipation - Resolved. Diarrhea c-diff negative Plan:  Cont diet (cont dysphagia  diet)   Anemia - Secondary to acute blood loss from Rt perinephric hematoma. Hgb Stable. Leukocytosis - Unclear etiology. Improving. Plan:  Cbc in am  Heparin gtt  DM - BG controlled. Plan:  AccuChecks q4hr Moderate Dose SSI per algorithm  NPH cont   Physical Deconditioning Recent right shoulder arthroplasty  Plan:  PT/OT consulted Fentanyl IV prn [facility] tests/ studies  Consults:Nephrology, SLP, RD, OT, PT, Ortho  STUDIES:  4/26 Renal u/s >> s/p Lt nephrectomy 4/27 CT abd/pelvis >> Rt perinephric hematoma 5/02 CT abd/pelvis >> increased size of hematoma 5/08 Echo >> EF 65 to 70% 5/18 Doppler legs b/l >> Acute DVT Rt gastrocnemius vein, age indeterminate DVT Rt femoral vein and popliteal vein 5/22 Port CXR: Support lines and tubes remain in position. Low lung volumes. Partial silhouetting of left hemidiaphragm without obvious opacity. 5/23 TTE: Moderate LVH. EF 65-70%. No wall motion abnormalities. LA mildly dilated. No AS or AR. No MR. RV normal in size & function. No effusion. 5/25 LLE Arterial Duplex: Prelim report. No evidence of stenosis or occluded arteries. Triphasic blood flow throughout left leg.  MICROBIOLOGY: 4/25 Blood >> Klebsiella pneumoniae 4/27 Blood >> negative 5/16 BAL >> oral flora 5/27 Trach Asp: few gpc pairs; chains; few GPR, rare GNR 5/27 Blood Ctx >>  ANTIBIOTICS: 4/28 Rocephin >> 5/10  LINES/TUBES: Rt IJ HD cath 4/26 - 5/12 ETT 5/3 - 5/13; 5/16 - 5/27 Lt femoral CVL 5/3 - 5/17 Lt chest tube 5/3 - 5/14 Rt chest tube 5/3 - 5/14 Rt IJ tunneled HD cath 5/12 >>  Lt  CVL 5/17 >>  NGT 5/8 >> PIV x1  Discharge Exam: BP 86/62 mmHg  Pulse 71  Temp(Src) 98.6 F (37 C) (Oral)  Resp 20  Ht 5\' 9"  (1.753 m)  Wt 236 lb 15.9 oz (107.5 kg)  BMI 34.98 kg/m2  SpO2 100%  General: Watching TV with wife at bedside Integument: Warm & dry HEENT: No scleral icterus Cardiovascular: s1 s2IRR Pulmonary:  Reduced, but no distress.  Abdomen: Soft. Normal bowel sounds. Nondistended. Nontender Neurological: Moving all 4 extremities equally. Hand grip symmetric. Follows commands. Moving all four limbs equally. Oriented to place, years, season, person, & president.  LABS:  Labs at discharge Lab Results  Component Value Date   CREATININE 4.27* 07/07/2015   BUN 69* 07/07/2015   NA 136 07/07/2015   K 3.7 07/07/2015   CL 98* 07/07/2015   CO2 26 07/07/2015   Lab Results  Component Value Date   WBC 7.7 07/07/2015   HGB 7.6* 07/07/2015   HCT 25.2* 07/07/2015   MCV 93.7 07/07/2015   PLT 264 07/07/2015   Lab Results  Component Value Date   ALT 31 07/05/2015   AST 41 07/05/2015   ALKPHOS 128* 07/05/2015   BILITOT 1.2 07/05/2015   Lab Results  Component Value Date   INR 1.41 07/07/2015   INR 1.31 07/06/2015   INR 1.57* 06/17/2015    Current radiology studies Dg Chest Port 1 View  07/06/2015  CLINICAL DATA:  Acute renal failure. EXAM: PORTABLE CHEST 1 VIEW COMPARISON:  06/27/2015 . FINDINGS: Interim extubation. Dialysis catheter and left subclavian central line in stable position. Feeding tube in stable  position. Stable cardiomegaly. Persistent left base subsegmental atelectasis and or infiltrate with slight interim progression. No pleural effusion or pneumothorax. IMPRESSION: 1. Interim extubation. Right dialysis catheter left subclavian central line stable position. Feeding tube in stable position. 2. Slight interim progression of left base atelectasis and or infiltrate. Electronically Signed   By: Marcello Moores  Register   On: 07/06/2015 07:39    Disposition:  to SDU vs LTAC      Discharged Condition: stable    Signed: Clementeen Graham 07/07/2015, 3:05 PM

## 2015-07-07 NOTE — Progress Notes (Signed)
ANTICOAGULATION CONSULT NOTE - Follow-up Consult  Pharmacy Consult for heparin and warfarin Indication: DVT  No Known Allergies  Patient Measurements: Height: 5\' 9"  (175.3 cm) Weight: 236 lb 15.9 oz (107.5 kg) IBW/kg (Calculated) : 70.7 Heparin Dosing Weight: 97.5 kg  Vital Signs: Temp: 98.8 F (37.1 C) (06/01 1124) Temp Source: Oral (06/01 1124) BP: 85/62 mmHg (06/01 0900) Pulse Rate: 81 (06/01 0900)  Labs:  Recent Labs  07/05/15 0414  07/05/15 0425 07/06/15 0445 07/06/15 1318 07/07/15 0525  HGB  --   < > 7.9* 8.2*  --  7.6*  HCT  --   --  26.8* 27.6*  --  25.2*  PLT  --   --  253 258  --  264  LABPROT  --   --   --   --  16.5* 17.4*  INR  --   --   --   --  1.31 1.41  HEPARINUNFRC 0.43  --   --  0.51  --  0.55  CREATININE  --   --  3.71* 2.68*  --  4.27*  < > = values in this interval not displayed.  Estimated Creatinine Clearance: 18.6 mL/min (by C-G formula based on Cr of 4.27).  Assessment: 75 y.o. male admitted on 05/31/2015 with sepsis. Transferred to ICU s/p cardiac arrest with hemorrhagic and septic shock on 5/3  PMH: prostate ca s/p radiation 2011, renal cell cancer s/p left nephrectomy, afib/hx of dvt on warfarin (home dose 6 mg MWF and 5 mg all other days)  Currently on IV heparin for acute DVT RLE with a known history of DVTs and atrial fibrillation and resuming warfarin. This morning's HL remains therapeutic at 0.55 on heparin 1450 units/hr. INR 1.41. No bleeding noted.  Hemoglobin and platelet count are stable. Po intake low at 35% on dys 2 diet.   Goal of Therapy:  Heparin level 0.3-0.7 units/mL  Monitor platelets by anticoagulation protocol: Yes   Plan:  Continue heparin infusion at 1450 units/hr Daily heparin level, CBC Restart Warfarin 6 mg po x1 today.  Daily PT/INR - monitor closely with low po intake Monitor S/sx of bleeding  Sloan Leiter, PharmD, BCPS Clinical Pharmacist [phone]  07/07/2015, 12:38 PM

## 2015-07-07 NOTE — Care Management Note (Signed)
Case Management Note  Patient Details  Name: [name] MRN: [id number] Date of Birth: December 27, 1940  Subjective/Objective:    Pt originally admitted with septic shock - then found to have perinephric hematoma                 Action/Plan:   07/07/2015  Pt reintubated overnight with continuous sedation and pressor.  Continuous CRRT  06/20/15 Pt remains on CRRT.  Pressor reinitiated over weekend.  CM will continue to follow for discharge needs  06/17/15 Pt remains ventilated on sedation with tube feeds.  Pt remains on CRRT - HD catheter placed in IR 06/17/15.  CM consulted with attending  regarding appropriateness for [facility] referral; pt does not appear to be appropriate for LTACH at this time - will revisit appropriateness next week   06/09/15: Pt is from home with wife - now intubated s/p IR procedure.  CM will continue to monitor for disposition needs   Expected Discharge Date:                  Expected Discharge Plan:  [place]  In-House Referral:  Clinical Social Work  Discharge planning Services  CM Consult  Post Acute Care Choice:    Choice offered to:     DME Arranged:    DME Agency:     HH Arranged:    [place] Agency:     Status of Service:  In process, will continue to follow  Medicare Important Message Given:    Date Medicare IM Given:    Medicare IM give by:    Date Additional Medicare IM Given:    Additional Medicare Important Message give by:     If discussed at [place] of Stay Meetings, dates discussed:    Additional Comments:  07/06/15 Wife chose [facility] - insurance auth in progress  Pt deemed appropriate for LTACH by both Physician Advisor and Attending.  CM spoke with pt and wife and informed of recommended discharge plan to [facility] - offered choice - at pt/wife request - provided referral to both [facility] and [facility] and requested each liason meet with family.  Pt extubated 5/27 to [place].  CRRT stopped - IHD initiated 07/05/15.  IV hep, pressor and  tube feeds.  CM received LTACH consult - CM left voicemail for Physician Advisor regarding appropriateness  07/01/15 Pt has plans for trach today in OR.  Pt remains on ventilator with CRRT - sedation and pressors.  06/23/15 CSW consulted for CIR back up plan Maryclare Labrador, RN 07/07/2015, 12:14 PM

## 2015-07-07 NOTE — Progress Notes (Signed)
Arrived to patient room 45M-04 at 1430.  Reviewed treatment plan and this RN agrees with plan.  Report received from bedside RN, Vaughan Basta.  Consent verified.  Patient A & O X 4.   Lung sounds clear and diminished to ausculation in all fields. BLE moderate edema. Cardiac:  Afib.  Removed caps and cleansed RIJ catheter with chlorhedxidine.  Aspirated ports of heparin and flushed them with saline per protocol.  Connected and secured lines, initiated treatment at 1445.  UF Goal of 2068mL and net fluid removal 2L.  Will continue to monitor.

## 2015-07-07 NOTE — Progress Notes (Addendum)
PULMONARY / CRITICAL CARE MEDICINE   Name: [name] MRN: [id number] DOB: 1940-05-02    ADMISSION DATE:  05/31/2015  REFERRING MD:  EDP  CHIEF COMPLAINT:  Hypotension   SUBJECTIVE: remains on pressors  VITAL SIGNS: BP 85/62 mmHg  Pulse 81  Temp(Src) 98.8 F (37.1 C) (Oral)  Resp 22  Ht 5\' 9"  (1.753 m)  Wt 236 lb 15.9 oz (107.5 kg)  BMI 34.98 kg/m2  SpO2 100%  INTAKE / OUTPUT: I/O last 3 completed shifts: In: 1361.1 [P.O.:340; I.V.:1021.1] Out: -   PHYSICAL EXAMINATION: General:  Watching TV with wife at bedside Integument:  Warm & dry HEENT:  No scleral icterus Cardiovascular:  s1 s2IRR Pulmonary:  Reduced, but no distress.  Abdomen: Soft. Normal bowel sounds. Nondistended. Nontender Neurological:  Moving all 4 extremities equally. Hand grip symmetric. Follows commands. Moving all four limbs equally. Oriented to place, years, season, person, & president.  LABS:  BMET  Recent Labs Lab 07/05/15 0425 07/06/15 0445 07/07/15 0525  NA 136 137 136  K 4.5 3.8 3.7  CL 102 99* 98*  CO2 23 27 26   BUN 101* 42* 69*  CREATININE 3.71* 2.68* 4.27*  GLUCOSE 201* 173* 113*    Electrolytes  Recent Labs Lab 07/03/15 0411  07/04/15 0355 07/05/15 0425 07/06/15 0445 07/07/15 0525  CALCIUM 8.5*  < > 8.5* 8.6* 8.2* 8.4*  MG 2.5*  --  2.5* 2.5*  --   --   PHOS 3.0  < > 3.3 4.0 4.0 5.2*  < > = values in this interval not displayed.  CBC  Recent Labs Lab 07/05/15 0425 07/06/15 0445 07/07/15 0525  WBC 8.2 9.0 7.7  HGB 7.9* 8.2* 7.6*  HCT 26.8* 27.6* 25.2*  PLT 253 258 264    Coag's  Recent Labs Lab 07/06/15 1318 07/07/15 0525  INR 1.31 1.41    Sepsis Markers  Recent Labs Lab 07/02/15 0718 07/03/15 0411 07/04/15 0355  PROCALCITON 2.01 2.18 2.81    ABG No results for input(s): PHART, PCO2ART, PO2ART in the last 168 hours.  Liver Enzymes  Recent Labs Lab 07/05/15 0425 07/06/15 0445 07/07/15 0525  AST 41  --   --   ALT 31  --   --    ALKPHOS 128*  --   --   BILITOT 1.2  --   --   ALBUMIN 2.0* 2.1* 2.1*    Cardiac Enzymes No results for input(s): TROPONINI, PROBNP in the last 168 hours.  Glucose  Recent Labs Lab 07/06/15 2014 07/07/15 0002 07/07/15 0352 07/07/15 0523 07/07/15 0839 07/07/15 1123  GLUCAP 146* 139* 142* 102* 135* 181*    Imaging No results found.   STUDIES:  4/26 Renal u/s >> s/p Lt nephrectomy 4/27 CT abd/pelvis >> Rt perinephric hematoma 5/02 CT abd/pelvis >> increased size of hematoma 5/08 Echo >> EF 65 to 70% 5/18 Doppler legs b/l >> Acute DVT Rt gastrocnemius vein, age indeterminate DVT Rt femoral vein and popliteal vein 5/22 Port CXR: Support lines and tubes remain in position. Low lung volumes. Partial silhouetting of left hemidiaphragm without obvious opacity. 5/23 TTE:  Moderate LVH. EF 65-70%. No wall motion abnormalities. LA mildly dilated. No AS or AR. No MR. RV normal in size & function. No effusion. 5/25 LLE Arterial Duplex:  Prelim report. No evidence of stenosis or occluded arteries. Triphasic blood flow throughout left leg.  MICROBIOLOGY: 4/25 Blood >> Klebsiella pneumoniae 4/27 Blood >> negative 5/16 BAL >> oral flora 5/27 Trach Asp: few gpc  pairs; chains; few GPR, rare GNR 5/27 Blood Ctx >>  ANTIBIOTICS: 4/28 Rocephin >> 5/10  SIGNIFICANT EVENTS: 4/25 Admitted with septic shock due to UTI. Significant ARF. 4/30 transferred out of ICU. 5/03 Cardiac arrest, anemia >> coil embolization Rt kidney by IR; PTX post-CPR 5/16 Reintubated due to airway secretions 5/18 start heparin gtt 5/27- extubated 5/28 off cvvhd  LINES/TUBES: Rt IJ HD cath 4/26 - 5/12 ETT 5/3 - 5/13; 5/16 - 5/27 Lt femoral CVL 5/3 - 5/17 Lt chest tube 5/3 - 5/14 Rt chest tube 5/3 - 5/14 Rt IJ tunneled HD cath 5/12  >>  Lt Boundary CVL 5/17 >>  NGT 5/8 >> PIV x1  ASSESSMENT / PLAN:  PULMONARY A: Acute Hypoxic Respiratory Failure - Post Cardiac Arrest. Pneumothorax after CPR 5/03 - chest  tubes removed 5/14. H/O OSA. Re-intubated 5/16 due to respiratory secretions. Extubated 5/27. Slow to improve.   P:  Continuing to wean FiO2 for saturation greater than 94% Incentive spirometer for pulmonary toilette Volume removal per HD   CARDIOVASCULAR A: S/P Cardiac Arrest Shock - Hemorrhagic secondary to Rt perinephric hematoma. Questionable cuff pressure readings. No effect w/ trial of Solu-Cortef. Right Gastocnemious vein acute DVT Intermediate age DVT Right femoral & popliteal veins Ischemic Changes Left Foot - Slightly worse. Arterial duplex w/o notable findings. vasoplegia P:  Levophed to keep MAP >55 Stress dose steroids  Hold outpt coreg, tricor, lasix, niacin, accupril Continue heparin gtt  / coumadin  Midodrine 10mg  tid  RENAL A:  Acute on Chronic Renal Failure  Hyperkalemia - Resolved. Hypophosphatemia - Resolved.  H/O Left Nephrectomy for RCC  P:  Nephrology following HD  F/u labs  GASTROINTESTINAL A:  H/O Dysphagia Constipation - Resolved. Diarrhea  P:  Cont diet (cont dysphagia diet) Ck cdiff   HEMATOLOGIC / ONCOLOGIC A: Anemia - Secondary to acute blood loss from Rt perinephric hematoma. Hgb Stable. Leukocytosis - Unclear etiology. Improving. H/O Left RCC - S/P nephrectomy remotely. H/O Anemia  P:  Cbc in am  Heparin gtt  INFECTIOUS A:  Sepsis Klebsiella Bacteremia - Completed Antibiotic course 5/10. PCt flat in setting arf P:  No antibiotics favored Following cultures from 5/27 PCT per algorithm  ENDOCRINE A:  DM - BG controlled.  P:  AccuChecks q4hr Moderate Dose SSI per algorithm  NPH cont   NEUROLOGIC/Muscluloskeletal  A:  Acute Encephalopathy - Possibly secondary to metabolic encephalopathy & shock. Resolving. Physical Deconditioning Recent right shoulder arthroplasty  P:  PT/OT consulted Fentanyl IV prn Repeat xray right shoulder   FAMILY UPDATE: Wife updated at bedside daily by  me      Erick Colace ACNP-BC [place] Pager # [phone] OR # [phone] if no answer   STAFF NOTE: I, Merrie Roof, MD FACP have personally reviewed patient's available data, including medical history, events of note, physical examination and test results as part of my evaluation. I have discussed with resident/NP and other care providers such as pharmacist, RN and RRT. In addition, I personally evaluated patient and elicited key findings of: awake, alert, in and out of chair, off pressors , tolerated HD prior, i think responded to stress steroids and accepting of lower MAP goals, keep on tele, keep  Midodine, follow BP response after HD today, afebrile, triad, hep/ coum [place], i updated wife, would suggest a slow 10 days taper steroids  Lavon Paganini. Titus Mould, MD, [place] Pgr: [facility] 07/07/2015 1:52 PM

## 2015-07-08 ENCOUNTER — Inpatient Hospital Stay (HOSPITAL_COMMUNITY): Payer: BLUE CROSS/BLUE SHIELD

## 2015-07-08 DIAGNOSIS — N189 Chronic kidney disease, unspecified: Secondary | ICD-10-CM

## 2015-07-08 DIAGNOSIS — D62 Acute posthemorrhagic anemia: Secondary | ICD-10-CM

## 2015-07-08 DIAGNOSIS — N183 Chronic kidney disease, stage 3 (moderate): Secondary | ICD-10-CM

## 2015-07-08 LAB — CBC WITH DIFFERENTIAL/PLATELET
BASOS ABS: 0 10*3/uL (ref 0.0–0.1)
BASOS PCT: 0 %
Eosinophils Absolute: 0 10*3/uL (ref 0.0–0.7)
Eosinophils Relative: 0 %
HEMATOCRIT: 29.2 % — AB (ref 39.0–52.0)
HEMOGLOBIN: 8.5 g/dL — AB (ref 13.0–17.0)
Lymphocytes Relative: 9 %
Lymphs Abs: 1 10*3/uL (ref 0.7–4.0)
MCH: 28.3 pg (ref 26.0–34.0)
MCHC: 29.1 g/dL — ABNORMAL LOW (ref 30.0–36.0)
MCV: 97.3 fL (ref 78.0–100.0)
Monocytes Absolute: 0.7 10*3/uL (ref 0.1–1.0)
Monocytes Relative: 6 %
NEUTROS ABS: 9.5 10*3/uL — AB (ref 1.7–7.7)
NEUTROS PCT: 85 %
Platelets: 270 10*3/uL (ref 150–400)
RBC: 3 MIL/uL — ABNORMAL LOW (ref 4.22–5.81)
RDW: 20.2 % — ABNORMAL HIGH (ref 11.5–15.5)
WBC: 11.2 10*3/uL — AB (ref 4.0–10.5)

## 2015-07-08 LAB — GLUCOSE, CAPILLARY
GLUCOSE-CAPILLARY: 165 mg/dL — AB (ref 65–99)
GLUCOSE-CAPILLARY: 107 mg/dL — AB (ref 65–99)
Glucose-Capillary: 114 mg/dL — ABNORMAL HIGH (ref 65–99)
Glucose-Capillary: 119 mg/dL — ABNORMAL HIGH (ref 65–99)
Glucose-Capillary: 198 mg/dL — ABNORMAL HIGH (ref 65–99)
Glucose-Capillary: 211 mg/dL — ABNORMAL HIGH (ref 65–99)

## 2015-07-08 LAB — HEPARIN LEVEL (UNFRACTIONATED): Heparin Unfractionated: 0.43 IU/mL (ref 0.30–0.70)

## 2015-07-08 LAB — RENAL FUNCTION PANEL
ALBUMIN: 2.1 g/dL — AB (ref 3.5–5.0)
Anion gap: 9 (ref 5–15)
BUN: 27 mg/dL — AB (ref 6–20)
CHLORIDE: 99 mmol/L — AB (ref 101–111)
CO2: 27 mmol/L (ref 22–32)
Calcium: 8.2 mg/dL — ABNORMAL LOW (ref 8.9–10.3)
Creatinine, Ser: 2.75 mg/dL — ABNORMAL HIGH (ref 0.61–1.24)
GFR calc Af Amer: 25 mL/min — ABNORMAL LOW (ref >60)
GFR calc non Af Amer: 21 mL/min — ABNORMAL LOW (ref >60)
GLUCOSE: 113 mg/dL — AB (ref 65–99)
PHOSPHORUS: 2.7 mg/dL (ref 2.5–4.6)
POTASSIUM: 4 mmol/L (ref 3.5–5.1)
Sodium: 135 mmol/L (ref 135–145)

## 2015-07-08 LAB — PROTIME-INR
INR: 1.35 (ref 0.00–1.49)
Prothrombin Time: 16.8 seconds — ABNORMAL HIGH (ref 11.6–15.2)

## 2015-07-08 LAB — MAGNESIUM: MAGNESIUM: 2.1 mg/dL (ref 1.7–2.4)

## 2015-07-08 MED ORDER — WARFARIN SODIUM 6 MG PO TABS
6.0000 mg | ORAL_TABLET | Freq: Once | ORAL | Status: AC
  Administered 2015-07-08: 6 mg via ORAL
  Filled 2015-07-08: qty 1

## 2015-07-08 NOTE — Consult Note (Signed)
Referring Physician: Nicole [facility] MD Patient name: [name] MRN: [id number] DOB: 12-20-1940 Sex: male  REASON FOR CONSULT: permanent hemodialysis access  HPI: [name] is a 75 y.o. male, admitted with sepsis and perinephric hematoma thought to be secondary to coagulapathy almost 6 weeks ago.  He was on ventilator from 5/3 with cardiac arrest and also bilateral chest tubes at that time. Reintubated 5/16 due to secretions extubated 5/27.  Pressors weaned off over last few days.  He is currently on heparin and loading with coumadin for afib and remote PE/DVT.  After a complicated hospital stay he has now progressed to ESRD.  He currently is dialyzing via a catheter. Other medical problems include history of PE, afib, prior nephrectomy prostate CA, diabetes sleep apnea  Past Medical History  Diagnosis Date  . Obesity   . Phlebitis     Lower extermity  . Pulmonary emboli ([place]) 2008    submassive, saddle  . Prostate cancer ([place]) 07/2009  . Sleep apnea     on CPAP  . Hx of echocardiogram 12/04/2010    Normal EF >55% no significant valve disease  . History of stress test 06/27/2009    Low risk and EF of approximately 50%  . DVT (deep venous thrombosis) ([place])   . Chronic kidney disease, stage 3     baseline creatinine ~1.4  . HLD (hyperlipidemia)   . HTN (hypertension)   . Dysrhythmia     A fib  . Diabetes mellitus ([place])     diet controlled  . History of hiatal hernia    Past Surgical History  Procedure Laterality Date  . Nephrectomy  1999    for CA  . Cholecystectomy  1999  . Transurethral resection of bladder tumor N/A 03/04/2013    Procedure: CYSTOSCOPY WITH RIGHT RETROGRADE PYELOGRAM AND BLADDER BIOPSY /CLOT EVACUATION/ BIOPSY PROSTATIC URETHRA WITH FULGERATION ;  Surgeon: Molli Hazard, MD;  Location: [facility];  Service: Urology;  Laterality: N/A;  . Cystoscopy w/ retrogrades N/A 04/08/2013    Procedure: CYSTOSCOPY WITH CLOT EVACUATION;  Surgeon: Molli Hazard, MD;  Location: [facility];  Service: Urology;  Laterality: N/A;  . Left shoulder repair    . Shoulder open rotator cuff repair Right 05/03/2015    Procedure: RIGHT SHOULDER ROTATOR CUFF REPAIR OPEN WITH GRAFT AND ANCHOR ;  Surgeon: Latanya Maudlin, MD;  Location: [facility];  Service: Orthopedics;  Laterality: Right;    Family History  Problem Relation Age of Onset  . Cancer Mother   . Diabetes Father   . Cancer Father     SOCIAL HISTORY: Social History   Social History  . Marital Status: Married    Spouse Name: N/A  . Number of Children: N/A  . Years of Education: N/A   Occupational History  . Not on file.   Social History Main Topics  . Smoking status: Never Smoker   . Smokeless tobacco: Never Used  . Alcohol Use: 0.0 oz/week    0 Standard drinks or equivalent per week     Comment: occasional beer or wine  . Drug Use: No  . Sexual Activity: Not on file   Other Topics Concern  . Not on file   Social History Narrative    No Known Allergies  Current Facility-Administered Medications  Medication Dose Route Frequency Provider Last Rate Last Dose  . 0.9 %  sodium chloride infusion   Intravenous Continuous Rigoberto Noel, MD 10 mL/hr at 07/07/15  1800    . 0.9 %  sodium chloride infusion  100 mL Intravenous PRN Mauricia Area, MD      . 0.9 %  sodium chloride infusion  100 mL Intravenous PRN Mauricia Area, MD      . acetaminophen (TYLENOL) solution 650 mg  650 mg Per Tube Q6H PRN Chesley Mires, MD   650 mg at 07/03/15 1646  . albuterol (PROVENTIL) (2.5 MG/3ML) 0.083% nebulizer solution 2.5 mg  2.5 mg Nebulization Q2H PRN Chesley Mires, MD      . alteplase (CATHFLO ACTIVASE) injection 2 mg  2 mg Intracatheter Once PRN Mauricia Area, MD      . antiseptic oral rinse solution (CORINZ)  7 mL Mouth Rinse QID [name], MD   7 mL at 07/08/15 0507  . calcitRIOL (ROCALTROL) 1 MCG/ML solution 0.25 mcg  0.25 mcg Per Tube Q48H [name], MD   0.25 mcg at 07/07/15  0904  . chlorhexidine gluconate (SAGE KIT) (PERIDEX) 0.12 % solution 15 mL  15 mL Mouth Rinse BID [name], MD   15 mL at 07/08/15 1009  . Darbepoetin Alfa (ARANESP) injection 200 mcg  200 mcg Intravenous Q Tue-HD Mauricia Area, MD   200 mcg at 07/05/15 1813  . fentaNYL (SUBLIMAZE) injection 25-50 mcg  25-50 mcg Intravenous Q1H PRN Javier Glazier, MD   25 mcg at 07/08/15 0315  . guaiFENesin (ROBITUSSIN) 100 MG/5ML solution 100 mg  5 mL Per Tube Q4H PRN Chesley Mires, MD   100 mg at 07/04/15 2124  . heparin ADULT infusion 100 units/mL (25000 units/250 mL)  1,450 Units/hr Intravenous Continuous Rebecka Apley, RPH 14.5 mL/hr at 07/08/15 0351 1,450 Units/hr at 07/08/15 0351  . heparin injection 1,000 Units  1,000 Units Dialysis PRN Mauricia Area, MD      . heparin injection 4,300 Units  40 Units/kg Dialysis Once in dialysis Mauricia Area, MD   4,300 Units at 07/07/15 1415  . hydrocortisone sodium succinate (SOLU-CORTEF) 100 MG injection 50 mg  50 mg Intravenous Q6H [name], MD   50 mg at 07/08/15 0948  . insulin aspart (novoLOG) injection 0-15 Units  0-15 Units Subcutaneous Q4H Rigoberto Noel, MD   3 Units at 07/08/15 0349  . insulin NPH Human (HUMULIN N,NOVOLIN N) injection 13 Units  13 Units Subcutaneous BID AC & HS [name], MD   13 Units at 07/08/15 1001  . lidocaine (PF) (XYLOCAINE) 1 % injection 5 mL  5 mL Intradermal PRN Mauricia Area, MD      . lidocaine-prilocaine (EMLA) cream 1 application  1 application Topical PRN Mauricia Area, MD      . midodrine (PROAMATINE) tablet 10 mg  10 mg Oral TID AC Chesley Mires, MD   10 mg at 07/08/15 0949  . norepinephrine (LEVOPHED) 4 mg in dextrose 5 % 250 mL (0.016 mg/mL) infusion  0-40 mcg/min Intravenous Titrated Aquilla Hacker, MD   Stopped at 07/07/15 1509  . ondansetron (ZOFRAN) injection 4 mg  4 mg Intravenous Q6H PRN Gardiner Barefoot, NP   4 mg at 06/08/15 0109  . pentafluoroprop-tetrafluoroeth (GEBAUERS)  aerosol 1 application  1 application Topical PRN Mauricia Area, MD      . RESOURCE THICKENUP CLEAR   Oral PRN [name], MD      . sodium chloride flush (NS) 0.9 % injection 10-40 mL  10-40 mL Intracatheter Q12H Colbert Coyer, MD   10 mL at 07/08/15 1003  .  sodium chloride flush (NS) 0.9 % injection 10-40 mL  10-40 mL Intracatheter PRN Colbert Coyer, MD   10 mL at 06/19/15 2140  . sucroferric oxyhydroxide (VELPHORO) chewable tablet 500 mg  500 mg Oral TID WC Mauricia Area, MD   500 mg at 07/08/15 0949  . warfarin (COUMADIN) tablet 6 mg  6 mg Oral ONCE-1800 Wynelle Fanny, [facility]      . Warfarin - Pharmacist Dosing Inpatient   Does not apply q1800 Priscella Mann, RPH   0  at 07/06/15 1800    ROS:   General:  + weight loss, Fever, chills  HEENT: No recent headaches, no nasal bleeding, no visual changes, no sore throat  Neurologic: No dizziness, blackouts, seizures. No recent symptoms of stroke or mini- stroke. No recent episodes of slurred speech, or temporary blindness.  Cardiac: No recent episodes of chest pain/pressure, + shortness of breath at rest.  + shortness of breath with exertion.  + history of atrial fibrillation or irregular heartbeat  Vascular: No history of rest pain in feet.  No history of claudication.  No history of non-healing ulcer, No history of DVT   Pulmonary: +oxygen, no productive cough, no hemoptysis,  No asthma or wheezing  Musculoskeletal:  '[ ]'  Arthritis, '[ ]'  Low back pain,  '[ ]'  Joint pain  Hematologic:No history of hypercoagulable state.  No history of easy bleeding.  No history of anemia  Gastrointestinal: No hematochezia or melena,  No gastroesophageal reflux, no trouble swallowing  Urinary: [ x] chronic Kidney disease, '[ ]'  on HD - '[ ]'  MWF or '[ ]'  TTHS, '[ ]'  Burning with urination, '[ ]'  Frequent urination, '[ ]'  Difficulty urinating;   Skin: No rashes  Psychological: No history of anxiety,  No history of depression   Physical  Examination  Filed Vitals:   07/08/15 0625 07/08/15 0700 07/08/15 0812 07/08/15 1115  BP: 100/76 93/62    Pulse: 77 78    Temp:   98.9 F (37.2 C) 99.1 F (37.3 C)  TempSrc:   Oral Oral  Resp: 19 20    Height:      Weight:      SpO2: 98% 99%      Body mass index is 35.08 kg/(m^2).  General:  Alert and oriented, no acute distress, appears very deconditioned and weak/frail HEENT: Normal Pulmonary: Clear to auscultation bilaterally Cardiac: Regular Rate and Rhythm without murmur Abdomen: Soft, non-tender, non-distended, no mass Skin: No rash Extremity Pulses:  2+ radial, brachial, femoral, absent dorsalis pedis, posterior tibial pulses bilaterally Musculoskeletal: No deformity 2+  Lower extremity edema  Neurologic: Upper and lower extremity motor 3-4/5 and symmetric  DATA:  Vein map pending  CBC    Component Value Date/Time   WBC 11.2* 07/08/2015 0746   RBC 3.00* 07/08/2015 0746   RBC 2.67* 04/04/2013 0013   HGB 8.5* 07/08/2015 0746   HCT 29.2* 07/08/2015 0746   PLT 270 07/08/2015 0746   MCV 97.3 07/08/2015 0746   MCH 28.3 07/08/2015 0746   MCHC 29.1* 07/08/2015 0746   RDW 20.2* 07/08/2015 0746   LYMPHSABS 1.0 07/08/2015 0746   MONOABS 0.7 07/08/2015 0746   EOSABS 0.0 07/08/2015 0746   BASOSABS 0.0 07/08/2015 0746     BMET    Component Value Date/Time   NA 135 07/08/2015 0522   K 4.0 07/08/2015 0522   CL 99* 07/08/2015 0522   CO2 27 07/08/2015 0522   GLUCOSE 113* 07/08/2015 0522   BUN 27* 07/08/2015  0522   CREATININE 2.75* 07/08/2015 0522   CALCIUM 8.2* 07/08/2015 0522   GFRNONAA 21* 07/08/2015 0522   GFRAA 25* 07/08/2015 0522   INR 1.3  ASSESSMENT:  Pt needs long term HD access.  He is currently very deconditioned only recently extubated for 2 stays on a ventilator and recent wean of pressors.   PLAN:  Will obtain vein mapping.  Access procedures discussed with pt and wife but he will need to recover a bit more before considering elective permanent  access procedure.  Will check back on his status early next week.   Ruta Hinds, MD [facility] Office: [phone] Pager: [phone]

## 2015-07-08 NOTE — Progress Notes (Signed)
Occupational Therapy Treatment Patient Details Name: [name] MRN: [id number] DOB: Mar 06, 1940 Today's Date: 07/08/2015    History of present illness Pt adm with septic shock likely due to UTI. Pt with renal failure and on CVVHD. Pt then with Rt perinephric hematoma that required embolization and pt with VDRF. Cardiac arrest 5/3. Reintubated 5/16. Extubated 5/27. PMH - recent (3/28) rt rotator cuff repair, Lt renal cell carcinoma s/p Lt nephrectomy, prostate cancer s/p XRT, PE in 2008, OSA, HTN, HLD, DM, A fib.   OT comments  Pt demonstrates improving activity tolerance and improving independence with functional mobility.     Follow Up Recommendations  Supervision/Assistance - 24 hour;LTACH    Equipment Recommendations  3 in 1 bedside comode    Recommendations for Other Services Rehab consult    Precautions / Restrictions Precautions Precautions: Fall Type of Shoulder Precautions: RTC repair. unsure of limitations. Sling per wife. Attmepted to contact Dr. Rich Brave through on call MD. Need clarification Precaution Comments: Per Dr Titus Mould, Pt able to weight bear through Rt UE, but no ROM of Rt shoulder  Required Braces or Orthoses: Sling Restrictions Weight Bearing Restrictions: No       Mobility Bed Mobility Overal bed mobility: Needs Assistance;+2 for physical assistance Bed Mobility: Supine to Sit     Supine to sit: Min assist;+2 for physical assistance     General bed mobility comments: assist to lift trunk and to scoot Rt hip to EOB   Transfers Overall transfer level: Needs assistance   Transfers: Sit to/from Stand;Stand Pivot Transfers Sit to Stand: Mod assist;+2 physical assistance         General transfer comment: Using Stedy, and bed height elevated.  Pt moved sit to stand with mod A +2 move into standin gosition.     Balance Overall balance assessment: Needs assistance Sitting-balance support: Feet supported Sitting balance-Leahy Scale: Fair      Standing balance support: Bilateral upper extremity supported Standing balance-Leahy Scale: Poor Standing balance comment: requires min A - min A to maintain static standing with bil. UE support                    ADL Overall ADL's : Needs assistance/impaired                         Toilet Transfer: Moderate assistance;+2 for physical assistance;Stand-pivot;BSC Toilet Transfer Details (indicate cue type and reason): used stedy for transfer  Toileting- Water quality scientist and Hygiene: Total assistance;Sit to/from stand       Functional mobility during ADLs: +2 for physical assistance;Moderate assistance (stedy )        Vision                     Perception     Praxis      Cognition   Behavior During Therapy: WFL for tasks assessed/performed Overall Cognitive Status: Difficult to assess (minimal verbalization, low volume )                       Extremity/Trunk Assessment               Exercises General Exercises - Upper Extremity Shoulder Flexion: AAROM;Left;10 reps;Seated Shoulder Extension: AROM;Left;10 reps;Seated Shoulder ABduction: AAROM;Left;10 reps;Seated Shoulder ADduction: AAROM;Left;10 reps;Seated Shoulder Horizontal ADduction: AAROM;Left;15 reps;Seated;Strengthening Elbow Flexion: AROM;Left;10 reps;Seated Elbow Extension: AROM;Left;10 reps;Seated   Shoulder Instructions       General Comments  Pertinent Vitals/ Pain       Pain Assessment: Faces Faces Pain Scale: Hurts little more Pain Location: Lt rib cage  Pain Descriptors / Indicators: Aching Pain Intervention(s): Monitored during session;Premedicated before session  Home Living                                          Prior Functioning/Environment              Frequency Min 2X/week     Progress Toward Goals  OT Goals(current goals can now be found in the care plan section)  Progress towards OT goals: Progressing toward  goals  ADL Goals Pt Will Perform Grooming: with supervision;with set-up;sitting Pt Will Perform Upper Body Bathing: with set-up;with supervision;sitting Pt Will Transfer to Toilet: with +2 assist;with min assist;bedside commode;stand pivot transfer Pt/caregiver will Perform Home Exercise Program: Increased ROM;Increased strength;Right Upper extremity;With Supervision;With written HEP provided  Plan Discharge plan remains appropriate    Co-evaluation    PT/OT/SLP Co-Evaluation/Treatment: Yes Reason for Co-Treatment: For patient/therapist safety;Complexity of the patient's impairments (multi-system involvement)   OT goals addressed during session: ADL's and self-care      End of Session Equipment Utilized During Treatment: Oxygen   Activity Tolerance Patient tolerated treatment well   Patient Left in chair;with call bell/phone within reach;with chair alarm set   Nurse Communication Mobility status        Time: 1426-1450 OT Time Calculation (min): 24 min  Charges: OT General Charges $OT Visit: 1 Procedure OT Treatments $Therapeutic Activity: 8-22 mins $Therapeutic Exercise: 8-22 mins  Rejoice Heatwole M 07/08/2015, 3:21 PM

## 2015-07-08 NOTE — Care Management Note (Signed)
Case Management Note  Patient Details  Name: [name] MRN: [id number] Date of Birth: 1940-08-27  Subjective/Objective:    Pt originally admitted with septic shock - then found to have perinephric hematoma                 Action/Plan:   07/08/2015  Pt reintubated overnight with continuous sedation and pressor.  Continuous CRRT  06/20/15 Pt remains on CRRT.  Pressor reinitiated over weekend.  CM will continue to follow for discharge needs  06/17/15 Pt remains ventilated on sedation with tube feeds.  Pt remains on CRRT - HD catheter placed in IR 06/17/15.  CM consulted with attending  regarding appropriateness for [facility] referral; pt does not appear to be appropriate for LTACH at this time - will revisit appropriateness next week   06/09/15: Pt is from home with wife - now intubated s/p IR procedure.  CM will continue to monitor for disposition needs   Expected Discharge Date:                  Expected Discharge Plan:  [place]  In-House Referral:  Clinical Social Work  Discharge planning Services  CM Consult  Post Acute Care Choice:    Choice offered to:     DME Arranged:    DME Agency:     HH Arranged:    [place] Agency:     Status of Service:  In process, will continue to follow  Medicare Important Message Given:    Date Medicare IM Given:    Medicare IM give by:    Date Additional Medicare IM Given:    Additional Medicare Important Message give by:     If discussed at [place] of Stay Meetings, dates discussed:    Additional Comments: 07/08/15 Insurance auth still in process.  CSW continues to follow as back up plan  07/06/15 Wife chose [facility] - insurance auth in progress  Pt deemed appropriate for [facility] by both Physician Advisor and Attending.  CM spoke with pt and wife and informed of recommended discharge plan to [facility] - offered choice - at pt/wife request - provided referral to both [facility] and [facility] and requested each liason meet with  family.  Pt extubated 5/27 to [place].  CRRT stopped - IHD initiated 07/05/15.  IV hep, pressor and tube feeds.  CM received LTACH consult - CM left voicemail for Physician Advisor regarding appropriateness  07/01/15 Pt has plans for trach today in OR.  Pt remains on ventilator with CRRT - sedation and pressors.  06/23/15 CSW consulted for CIR back up plan Maryclare Labrador, RN 07/08/2015, 9:50 AM

## 2015-07-08 NOTE — Progress Notes (Signed)
ANTICOAGULATION CONSULT NOTE - Follow-up Consult  Pharmacy Consult for heparin and warfarin Indication: DVT  No Known Allergies  Patient Measurements: Height: 5\' 9"  (175.3 cm) Weight: 237 lb 10.5 oz (107.8 kg) IBW/kg (Calculated) : 70.7 Heparin Dosing Weight: 97.5 kg  Vital Signs: Temp: 99.1 F (37.3 C) (06/02 1115) Temp Source: Oral (06/02 1115) BP: 93/62 mmHg (06/02 0700) Pulse Rate: 78 (06/02 0700)  Labs:  Recent Labs  07/06/15 0445 07/06/15 1318 07/07/15 0525 07/08/15 0522 07/08/15 0523 07/08/15 0746  HGB 8.2*  --  7.6*  --   --  8.5*  HCT 27.6*  --  25.2*  --   --  29.2*  PLT 258  --  264  --   --  270  LABPROT  --  16.5* 17.4*  --  16.8*  --   INR  --  1.31 1.41  --  1.35  --   HEPARINUNFRC 0.51  --  0.55  --  0.43  --   CREATININE 2.68*  --  4.27* 2.75*  --   --     Estimated Creatinine Clearance: 28.9 mL/min (by C-G formula based on Cr of 2.75).  Assessment: 75 y.o. male admitted on 05/31/2015 with sepsis. Transferred to ICU s/p cardiac arrest with hemorrhagic and septic shock on 5/3  PMH: prostate ca s/p radiation 2011, renal cell cancer s/p left nephrectomy, afib/hx of dvt on warfarin (home dose 6 mg MWF and 5 mg all other days)  Currently on IV heparin for acute DVT RLE with a known history of DVTs and atrial fibrillation and resuming warfarin. HL 0.43 therapeutic this AM on heparin 1450 units/h. INR subtherapeutic at 1.35 on day 3 heparin bridge. No bleeding noted.  Hemoglobin and platelet count are stable. PO intake continues to be low.  Goal of Therapy:  Heparin level 0.3-0.7 units/mL  Monitor platelets by anticoagulation protocol: Yes   Plan:  Continue heparin infusion at 1450 units/hr Daily heparin level, CBC Warfarin 6 mg po x1 today.  Daily PT/INR - monitor closely with low po intake Monitor s/sx of bleeding   Heloise Ochoa, Pharm.D., BCPS PGY2 Cardiology Pharmacy Resident Pager: [phone] 07/08/2015, 12:06 PM

## 2015-07-08 NOTE — Progress Notes (Signed)
Physical Therapy Treatment Patient Details Name: KIMBERLEY COHEE MRN: [id number] DOB: 03-31-1940 Today's Date: 07/08/2015    History of Present Illness Pt adm with septic shock likely due to UTI. Pt with renal failure and on CVVHD. Pt then with Rt perinephric hematoma that required embolization and pt with VDRF. Cardiac arrest 5/3. Reintubated 5/16. Extubated 5/27. PMH - recent (3/28) rt rotator cuff repair, Lt renal cell carcinoma s/p Lt nephrectomy, prostate cancer s/p XRT, PE in 2008, OSA, HTN, HLD, DM, A fib.    PT Comments    Pt admitted with above diagnosis. Pt currently with functional limitations due to balance and endurance deficits. Pt was able to stand in stedy x 2.  Tolerated baratric stedy well.  Fatigues quickly but can be pushed and encouraged.   Pt will benefit from skilled PT to increase their independence and safety with mobility to allow discharge to the venue listed below.    Follow Up Recommendations  CIR;LTACH (possibly in the future depending on progress)     Equipment Recommendations  Other (comment) (To be assessed)    Recommendations for Other Services OT consult     Precautions / Restrictions Precautions Precautions: Fall Type of Shoulder Precautions: RTC repair. Precaution Comments: Per Dr Titus Mould who called Dr. Gladstone Lighter,  Pt able to weight bear through Rt UE, but no ROM of Rt shoulder  Required Braces or Orthoses: Sling Restrictions Weight Bearing Restrictions: No    Mobility  Bed Mobility Overal bed mobility: Needs Assistance;+2 for physical assistance Bed Mobility: Supine to Sit     Supine to sit: Min assist;+2 for physical assistance     General bed mobility comments: assist to lift trunk and to scoot Rt hip to EOB   Transfers Overall transfer level: Needs assistance   Transfers: Sit to/from Stand;Stand Pivot Transfers Sit to Stand: Mod assist;+2 physical assistance         General transfer comment: Using bariatricStedy, and bed height  elevated.  Pt moved sit to stand with mod A +2 move into standing position. Stood Gaffer.    Ambulation/Gait                 Stairs            Wheelchair Mobility    Modified Rankin (Stroke Patients Only)       Balance Overall balance assessment: Needs assistance Sitting-balance support: No upper extremity supported;Feet supported Sitting balance-Leahy Scale: Fair     Standing balance support: Bilateral upper extremity supported;During functional activity Standing balance-Leahy Scale: Poor Standing balance comment: requires min guard to min assist to maintain static standing with bil UE support.                    Cognition Arousal/Alertness: Awake/alert Behavior During Therapy: WFL for tasks assessed/performed Overall Cognitive Status: Difficult to assess (minimal verbalization, low volume )                      Exercises General Exercises - Upper Extremity Shoulder Flexion: AAROM;Left;10 reps;Seated Shoulder Extension: AROM;Left;10 reps;Seated Shoulder ABduction: AAROM;Left;10 reps;Seated Shoulder ADduction: AAROM;Left;10 reps;Seated Shoulder Horizontal ADduction: AAROM;Left;15 reps;Seated;Strengthening Elbow Flexion: AROM;Left;10 reps;Seated Elbow Extension: AROM;Left;10 reps;Seated General Exercises - Lower Extremity Ankle Circles/Pumps: AROM;Right;Left;10 reps;Seated Quad Sets: AROM;Both;15 reps;Seated Gluteal Sets: AROM;Both;15 reps;Seated Long Arc Quad: AROM;Both;10 reps;Seated Heel Slides: AAROM;10 reps;Both;Seated Straight Leg Raises: AAROM;Both;10 reps;Seated    General Comments        Pertinent Vitals/Pain Pain Assessment: Faces Faces Pain Scale: Hurts  little more Pain Location: Lt rib cage  Pain Descriptors / Indicators: Aching Pain Intervention(s): Limited activity within patient's tolerance;Monitored during session;Repositioned;Premedicated before session  VSS    Home Living                      Prior Function             PT Goals (current goals can now be found in the care plan section) Progress towards PT goals: Progressing toward goals    Frequency  Min 3X/week    PT Plan Current plan remains appropriate    Co-evaluation PT/OT/SLP Co-Evaluation/Treatment: Yes Reason for Co-Treatment: For patient/therapist safety PT goals addressed during session: Mobility/safety with mobility OT goals addressed during session: ADL's and self-care     End of Session Equipment Utilized During Treatment: Gait belt;Oxygen Activity Tolerance: Patient limited by fatigue Patient left: in chair;with call bell/phone within reach     Time: 1425-1456 PT Time Calculation (min) (ACUTE ONLY): 31 min  Charges:  $Therapeutic Activity: 8-22 mins                    G CodesDenice Paradise 07/18/15, 4:36 PM [facility] [facility] [phone] (pager)

## 2015-07-08 NOTE — Progress Notes (Signed)
Right  Upper Extremity Vein Map    Cephalic  Segment Diameter Depth Comment  1. Axilla 3.4 mm 13.0 mm   2. Mid upper arm 2.4 mm 8.0 mm   3. Above AC 2.6 mm 4.3 mm   4. In AC 4.6 mm 2.6 mm   5. Below AC 4.5 mm 5.6 mm Branch  6. Mid forearm 4.5 mm 10.0 mm   7. Wrist 2.3 mm 3.7 mm    Basilic  Segment Diameter Depth Comment  1. Axilla 4.5 mm 20.6 mm   2. Mid upper arm 3.0 mm 14.1 mm   3. Above AC 3.6 mm 15.3 mm   4. In AC 3.5 mm 11.1 mm   5. Below AC 2.8 mm 6.1 mm Branch  6. Mid forearm 2.1 mm 1.1 mm   Left Upper Extremity Vein Map    Cephalic  Segment Diameter Depth Comment  1. Axilla 3.5 mm 5.6 mm   2. Mid upper arm 3.2 mm 4.6 mm   3. Above AC 2.7 mm 5.0 mm   4. In AC 3.5 mm 2.2 mm   5. Below AC 3.0 mm 4.7 mm Branch  6. Mid forearm 2.8 mm 3.3 mm   7. Wrist 2.8 mm 2.1 mm    Basilic  Segment Diameter Depth Comment  1. Axilla 3.8 mm 22.6 mm   2. Mid upper arm 3.2 mm 21.0 mm   3. Above AC 4.0 mm 20.0 mm   4. In AC 3.7 mm 23.0 mm   5. Below AC 3.0 mm 20.0 mm   6. Mid forearm 2.9 mm 27.0 mm

## 2015-07-08 NOTE — Progress Notes (Signed)
Speech Language Pathology Treatment: Dysphagia  Patient Details Name: [name] MRN: [id number] DOB: December 14, 1940 Today's Date: 07/08/2015 Time: [id number] SLP Time Calculation (min) (ACUTE ONLY): 24 min  Assessment / Plan / Recommendation Clinical Impression  Pt seen during breakfast meal with SLP assistance provided for set-up and Min-Mod cues needed for pacing with liquid intake. Immediate cough heard x1 following liquid consumption. Pt and wife educated about results and recommendations from [facility] earlier this week. Thorough education provided about the difference between thin and nectar thick liquids, as pt reports he has been eating a lot of cherry icees - which melt to a thin liquid in his oral cavity. Reiterated that penetration on MBS was silent, meaning that he may not feel associated symptoms. Will continue to follow and likely begin advanced PO trials early next week.   HPI HPI: 75 yo male with hemorrhagic shock leading to cardiac arrest, VDRF, AKI from Rt perinephric hematoma. He was intubated 5/03-5/13, 5/16-5/27. He has hx of Lt renal cell carcinoma s/p Lt nephrectomy, prostate cancer s/p XRT, PE in 2008, OSA, HTN, HLD, DM, A fib.      SLP Plan  Continue with current plan of care     Recommendations  Diet recommendations: Dysphagia 2 (fine chop);Nectar-thick liquid Liquids provided via: Cup Medication Administration: Crushed with puree Supervision: Patient able to self feed;Full supervision/cueing for compensatory strategies Compensations: Minimize environmental distractions;Slow rate;Small sips/bites;Multiple dry swallows after each bite/sip Postural Changes and/or Swallow Maneuvers: Seated upright 90 degrees             Oral Care Recommendations: Oral care BID Follow up Recommendations: LTACH Plan: Continue with current plan of care     GO               Germain Osgood, M.A. CCC-SLP [phone]  Germain Osgood 07/08/2015, 9:18 AM

## 2015-07-08 NOTE — Progress Notes (Addendum)
PROGRESS NOTE    BARBARA KENG  JSE:[id number] DOB: 1940/06/06 DOA: 05/31/2015 PCP: Melinda Crutch   Brief Narrative:  [name] is a 75 y.o. WM PMHx Pulmonary emboli 2008,DVT (deep venous thrombosis), HTN, Dysrhythmia, HLD, DM type II with complication, Prostate CA s/p radiation 2011 and RCC s/p Lt Nephrectomy.   He was seen in ED 4/22 for clogged catheter which was irrigated and replaced. He apparently has had hematuria for quite some time which has been attributed to scarring from prior radiation.  He had been in his USOH until roughly 4/20 when he had decreased appetite. He had not been eating or drinking for the past 5 days. On 04/25 he went to have his INR checked (on coumadin for A.fib and hx DVT / PE) and was found to have INR of 8 and was hypotensive with SBP 70/44. He was subsequently sent to ED for further evaluation.  In ED, he was found to have UTI and despite 4.5L IVF, he remained hypotensive. He also had multiple metabolic derangements including Na 124, K 5.0, AGMA, lactate 3, SCr 9.36. PCCM was therefore consulted for admission of septic shock due to urosepsis as well as AKI.    Assessment & Plan:   Active Problems:   Essential hypertension   Gross hematuria   Atrial fibrillation (HCC)   Septic shock (HCC)   Urinary tract infectious disease   Encounter for central line placement   Acute renal failure (HCC)   Dyspnea   Bacteremia due to Klebsiella pneumoniae   Secondary cardiomyopathy ([place])   Chronic atrial fibrillation ([place])   Uncontrolled type 2 diabetes mellitus with complication (HCC)   Perinephric hematoma   Right flank pain   Acute respiratory failure (HCC)   Acute back pain   Bleeding   Chest tube in place   Acute hypoxemic respiratory failure (HCC)   Acute renal failure (ARF) (HCC)   Acute on chronic renal failure ([place])   Hemorrhagic shock   S/p nephrectomy   Sepsis due to Klebsiella ([place])   Acute Hypoxic Respiratory Failure  - Post  Cardiac Arrest. Resolved  Pneumothorax after CPR 5/03 - chest tubes removed 5/14.  OSA. -CPAP per respiratory  Cardiac Arrest  Chronic diastolic CHF -Strict in and out since admission -4.1 L -Daily weight Filed Weights   07/07/15 1845 07/08/15 0500 07/09/15 0433  Weight: 107 kg (235 lb 14.3 oz) 107.8 kg (237 lb 10.5 oz) 107.3 kg (236 lb 8.9 oz)    Hemorrhagic Shock/Acute Blood loss Anemia  -secondary to Rt Perinephric Hematoma.  - No effect w/ trial of Solu-Cortef. -Patient remained he currently> 60 and patient asymptomatic  Recent Labs Lab 07/04/15 0355 07/05/15 0425 07/06/15 0445 07/07/15 0525 07/08/15 0746  HGB 7.9* 7.9* 8.2* 7.6* 8.5*   Leukocytosis  - Unclear etiology. Improving.  Right Gastocnemious vein acute DVT/DVT Right femoral & popliteal veins -Treatment dose heparin. Monitor hemoglobin closely secondary to patient's episode of hemorrhagic shock.  Ischemic Changes Left Foot  - Slightly worse. Arterial duplex w/o notable findings. vasoplegia -Continue heparin gtt / coumadin  -PT/OT consulted  Acute on Chronic Renal Failure  Lab Results  Component Value Date   CREATININE 4.00* 07/09/2015   CREATININE 2.75* 07/08/2015   CREATININE 4.27* 07/07/2015  -Volume removal per HD  H/O Left Nephrectomy for RCC  Dysphagia  Sepsis/positive Klebsiella Bacteremia  - Completed Antibiotic course 5/10.  DM type 2 uncontrolled   -5/2 HgA1C= 7.8  Acute Encephalopathy  -Fentanyl IV prn -Repeat xray right  shoulder    DVT prophylaxis: Treatment dose heparin Code Status: Full Family Communication: None Disposition Plan:    Consultants:    Procedures/Significant Events:  4/26 Renal u/s >> s/p Lt nephrectomy 4/27 CT abd/pelvis >> Rt perinephric hematoma 5/02 CT abd/pelvis >> increased size of hematoma 5/08 Echo >> EF 65 to 70% 5/18 Doppler legs b/l >> Acute DVT Rt gastrocnemius vein, age indeterminate DVT Rt femoral vein and popliteal vein 5/22 Port  CXR: Support lines and tubes remain in position. Low lung volumes. Partial silhouetting of left hemidiaphragm without obvious opacity. 5/23 TTE: Moderate LVH. EF 65-70%. No wall motion abnormalities. LA mildly dilated. No AS or AR. No MR. RV normal in size & function. No effusion. 5/25 LLE Arterial Duplex: Prelim report. No evidence of stenosis or occluded arteries. Triphasic blood flow throughout left leg.  4/25 Admitted with septic shock due to UTI. Significant ARF. 4/30 transferred out of ICU. 5/03 Cardiac arrest, anemia >> coil embolization Rt kidney by IR; PTX post-CPR 5/16 Reintubated due to airway secretions 5/18 start heparin gtt 5/27- extubated 5/28 off cvvhd   Cultures 4/25 Blood >> positive Klebsiella pneumoniae 4/27 Blood >> negative 5/16 BAL >> oral flora 5/27 Trach Asp: Positive few gpc pairs; chains; few GPR, rare GNR 5/27 Blood Ctx >>  Antimicrobials: 4/28 Rocephin >> 5/10   Devices    LINES / TUBES:  Rt IJ HD cath 4/26 - 5/12 ETT 5/3 - 5/13; 5/16 - 5/27 Lt femoral CVL 5/3 - 5/17 Lt chest tube 5/3 - 5/14 Rt chest tube 5/3 - 5/14 Rt IJ tunneled HD cath 5/12 >>  Lt [place] CVL 5/17 >>  NGT 5/8 >> PIV x1    Continuous Infusions: . sodium chloride 10 mL/hr at 07/09/15 0600  . heparin 1,450 Units/hr (07/09/15 0600)  . norepinephrine (LEVOPHED) Adult infusion Stopped (07/07/15 1509)     Subjective: 6/2  A/O 4, sitting in chair comfortably. Negative SOB, negative abdominal/CVA tenderness, negative leg pain.  Objective: Filed Vitals:   07/09/15 0433 07/09/15 0500 07/09/15 0600 07/09/15 0749  BP:  102/68 94/66   Pulse:  68 65   Temp:    97.9 F (36.6 C)  TempSrc:    Oral  Resp:  16 18   Height:      Weight: 107.3 kg (236 lb 8.9 oz)     SpO2:  98% 98%     Intake/Output Summary (Last 24 hours) at 07/09/15 0812 Last data filed at 07/09/15 0600  Gross per 24 hour  Intake 480.52 ml  Output      0 ml  Net 480.52 ml   Filed Weights   07/07/15  1845 07/08/15 0500 07/09/15 0433  Weight: 107 kg (235 lb 14.3 oz) 107.8 kg (237 lb 10.5 oz) 107.3 kg (236 lb 8.9 oz)    Examination:  General:   A/O 4, sitting in chair comfortably.No acute respiratory distress Eyes: negative scleral hemorrhage, negative anisocoria, negative icterus ENT: Negative Runny nose, negative gingival bleeding, CorTrak tube present Neck:  Negative scars, masses, torticollis, lymphadenopathy, JVD Lungs: Clear to auscultation bilaterally without wheezes or crackles. Right chest wall CVL present Cardiovascular: Regular rate and rhythm without murmur gallop or rub normal S1 and S2 Abdomen: negative abdominal pain, nondistended, positive soft, bowel sounds, no rebound, no ascites, no appreciable mass Extremities: No significant cyanosis, clubbing, or edema bilateral lower extremities Skin: Negative rashes, lesions, ulcers Psychiatric:  Negative depression, negative anxiety, negative fatigue, negative mania  Central nervous system:  Cranial nerves II  through XII intact, tongue/uvula midline, all extremities muscle strength 5/5, sensation intact throughout,  negative dysarthria, negative expressive aphasia, negative receptive aphasia. .     Data Reviewed: Care during the described time interval was provided by me .  I have reviewed this patient's available data, including medical history, events of note, physical examination, and all test results as part of my evaluation. I have personally reviewed and interpreted all radiology studies.  CBC:  Recent Labs Lab 07/04/15 0355 07/05/15 0425 07/06/15 0445 07/07/15 0525 07/08/15 0746  WBC 8.6 8.2 9.0 7.7 11.2*  NEUTROABS 6.3  --  7.4 6.4 9.5*  HGB 7.9* 7.9* 8.2* 7.6* 8.5*  HCT 27.8* 26.8* 27.6* 25.2* 29.2*  MCV 96.5 93.7 93.9 93.7 97.3  PLT 228 253 258 264 706   Basic Metabolic Panel:  Recent Labs Lab 07/03/15 0411  07/04/15 0355 07/05/15 0425 07/06/15 0445 07/07/15 0525 07/08/15 0522 07/08/15 0746  07/09/15 0400  NA 137  < > 137 136 137 136 135  --  133*  K 4.2  < > 4.4 4.5 3.8 3.7 4.0  --  4.6  CL 102  < > 102 102 99* 98* 99*  --  98*  CO2 27  < > '26 23 27 26 27  ' --  25  GLUCOSE 151*  < > 177* 201* 173* 113* 113*  --  136*  BUN 26*  < > 48* 101* 42* 69* 27*  --  46*  CREATININE 1.20  < > 2.11* 3.71* 2.68* 4.27* 2.75*  --  4.00*  CALCIUM 8.5*  < > 8.5* 8.6* 8.2* 8.4* 8.2*  --  8.3*  MG 2.5*  --  2.5* 2.5*  --   --   --  2.1  --   PHOS 3.0  < > 3.3 4.0 4.0 5.2* 2.7  --  3.5  < > = values in this interval not displayed. GFR: Estimated Creatinine Clearance: 19.8 mL/min (by C-G formula based on Cr of 4). Liver Function Tests:  Recent Labs Lab 07/05/15 0425 07/06/15 0445 07/07/15 0525 07/08/15 0522 07/09/15 0400  AST 41  --   --   --   --   ALT 31  --   --   --   --   ALKPHOS 128*  --   --   --   --   BILITOT 1.2  --   --   --   --   PROT 6.2*  --   --   --   --   ALBUMIN 2.0* 2.1* 2.1* 2.1* 2.0*   No results for input(s): LIPASE, AMYLASE in the last 168 hours. No results for input(s): AMMONIA in the last 168 hours. Coagulation Profile:  Recent Labs Lab 07/06/15 1318 07/07/15 0525 07/08/15 0523 07/09/15 0400  INR 1.31 1.41 1.35 1.37   Cardiac Enzymes: No results for input(s): CKTOTAL, CKMB, CKMBINDEX, TROPONINI in the last 168 hours. BNP (last 3 results) No results for input(s): PROBNP in the last 8760 hours. HbA1C: No results for input(s): HGBA1C in the last 72 hours. CBG:  Recent Labs Lab 07/08/15 1114 07/08/15 1513 07/08/15 1950 07/08/15 2337 07/09/15 0338  GLUCAP 198* 114* 211* 112* 158*   Lipid Profile: No results for input(s): CHOL, HDL, LDLCALC, TRIG, CHOLHDL, LDLDIRECT in the last 72 hours. Thyroid Function Tests: No results for input(s): TSH, T4TOTAL, FREET4, T3FREE, THYROIDAB in the last 72 hours. Anemia Panel: No results for input(s): VITAMINB12, FOLATE, FERRITIN, TIBC, IRON, RETICCTPCT in the last 72 hours.  Urine analysis:    Component  Value Date/Time   COLORURINE RED* 05/31/2015 1158   APPEARANCEUR TURBID* 05/31/2015 1158   LABSPEC 1.021 05/31/2015 1158   PHURINE 7.5 05/31/2015 1158   GLUCOSEU NEGATIVE 05/31/2015 1158   HGBUR LARGE* 05/31/2015 1158   BILIRUBINUR LARGE* 05/31/2015 1158   KETONESUR 40* 05/31/2015 1158   PROTEINUR >300* 05/31/2015 1158   UROBILINOGEN 0.2 03/06/2013 1748   NITRITE POSITIVE* 05/31/2015 1158   LEUKOCYTESUR LARGE* 05/31/2015 1158   Sepsis Labs: '@LABRCNTIP' (procalcitonin:4,lacticidven:4)  ) Recent Results (from the past 240 hour(s))  Culture, blood (routine x 2)     Status: None   Collection Time: 07/02/15 12:02 PM  Result Value Ref Range Status   Specimen Description BLOOD RIGHT ANTECUBITAL  Final   Special Requests BOTTLES DRAWN AEROBIC ONLY 5CC  Final   Culture NO GROWTH 5 DAYS  Final   Report Status 07/07/2015 FINAL  Final  Culture, blood (routine x 2)     Status: None   Collection Time: 07/02/15 12:09 PM  Result Value Ref Range Status   Specimen Description BLOOD RIGHT HAND  Final   Special Requests IN PEDIATRIC BOTTLE 1CC  Final   Culture NO GROWTH 5 DAYS  Final   Report Status 07/07/2015 FINAL  Final  Culture, respiratory (NON-Expectorated)     Status: None   Collection Time: 07/02/15  1:40 PM  Result Value Ref Range Status   Specimen Description TRACHEAL ASPIRATE  Final   Special Requests Normal  Final   Gram Stain   Final    ABUNDANT WBC PRESENT, PREDOMINANTLY PMN NO SQUAMOUS EPITHELIAL CELLS SEEN MODERATE GRAM POSITIVE COCCI IN PAIRS IN CHAINS FEW GRAM POSITIVE RODS RARE GRAM NEGATIVE RODS    Culture Consistent with normal respiratory flora.  Final   Report Status 07/07/2015 FINAL  Final  C difficile quick scan w PCR reflex     Status: None   Collection Time: 07/07/15  8:41 PM  Result Value Ref Range Status   C Diff antigen NEGATIVE NEGATIVE Final   C Diff toxin NEGATIVE NEGATIVE Final   C Diff interpretation Negative for toxigenic C. difficile  Final          Radiology Studies: Dg Shoulder Right  07/07/2015  CLINICAL DATA:  Right shoulder pain. Recent rotator cuff surgery. No known injury. EXAM: RIGHT SHOULDER - 2+ VIEW COMPARISON:  Single-view of the chest 06/19/2015 and 07/06/2015. FINDINGS: There is lucency in the greater tuberosity. There appears to be a small focus of cortical disruption present. Mild appearing acromioclavicular degenerative change is seen. Dialysis catheter and feeding tube are noted. IMPRESSION: Small cortical defect in the greater tuberosity of the right humeral head and increased lucency in the greater tuberosity could be due to infection or related to the patient's recent surgery. Projection is different than on prior chest x-rays but the findings appear more conspicuous. Electronically Signed   By: Inge Rise M.D.   On: 07/07/2015 17:24        Scheduled Meds: . antiseptic oral rinse  7 mL Mouth Rinse QID  . calcitRIOL  0.25 mcg Per Tube Q48H  . chlorhexidine gluconate (SAGE KIT)  15 mL Mouth Rinse BID  . darbepoetin (ARANESP) injection - DIALYSIS  200 mcg Intravenous Q Tue-HD  . heparin  40 Units/kg Dialysis Once in dialysis  . hydrocortisone sodium succinate  50 mg Intravenous Q6H  . insulin aspart  0-15 Units Subcutaneous Q4H  . insulin NPH Human  13 Units Subcutaneous BID AC &  HS  . midodrine  10 mg Oral TID AC  . sodium chloride flush  10-40 mL Intracatheter Q12H  . sucroferric oxyhydroxide  500 mg Oral TID WC  . Warfarin - Pharmacist Dosing Inpatient   Does not apply q1800   Continuous Infusions: . sodium chloride 10 mL/hr at 07/09/15 0600  . heparin 1,450 Units/hr (07/09/15 0600)  . norepinephrine (LEVOPHED) Adult infusion Stopped (07/07/15 1509)     LOS: 39 days    Time spent: 40 minutes    Joeline Freer, Geraldo Docker, MD [facility] Pager [phone]   If 7PM-7AM, please contact night-coverage [email or web address] Password [email or web address] 07/09/2015, 8:12 AM

## 2015-07-08 NOTE — Progress Notes (Signed)
Subjective: Interval History: has no complaint.  Objective: Vital signs in last 24 hours: Temp:  [97.7 F (36.5 C)-98.8 F (37.1 C)] 97.9 F (36.6 C) (06/02 0339) Pulse Rate:  [62-88] 75 (06/02 0500) Resp:  [13-25] 20 (06/02 0500) BP: (69-97)/(44-69) 92/69 mmHg (06/02 0500) SpO2:  [97 %-100 %] 97 % (06/02 0500) Weight:  [107 kg (235 lb 14.3 oz)-107.8 kg (237 lb 10.5 oz)] 107.8 kg (237 lb 10.5 oz) (06/02 0500) Weight change: 0 kg (0 lb)  Intake/Output from previous day: 06/01 0701 - 06/02 0700 In: 724.5 [P.O.:200; I.V.:524.5] Out: 501 [Stool:1] Intake/Output this shift: Total I/O In: 245 [I.V.:245] Out: -   General appearance: alert, cooperative, moderately obese and pale Resp: diminished breath sounds bilaterally and rales bibasilar Chest wall: RIJ PC Cardio: S1, S2 normal and systolic murmur: holosystolic 2/6, blowing at apex GI: obese, pos bs, liver down 6 cm Extremities: edema 1+  Lab Results:  Recent Labs  07/06/15 0445 07/07/15 0525  WBC 9.0 7.7  HGB 8.2* 7.6*  HCT 27.6* 25.2*  PLT 258 264   BMET:  Recent Labs  07/07/15 0525 07/08/15 0522  NA 136 135  K 3.7 4.0  CL 98* 99*  CO2 26 27  GLUCOSE 113* 113*  BUN 69* 27*  CREATININE 4.27* 2.75*  CALCIUM 8.4* 8.2*   No results for input(s): PTH in the last 72 hours. Iron Studies: No results for input(s): IRON, TIBC, TRANSFERRIN, FERRITIN in the last 72 hours.  Studies/Results: Dg Shoulder Right  07/07/2015  CLINICAL DATA:  Right shoulder pain. Recent rotator cuff surgery. No known injury. EXAM: RIGHT SHOULDER - 2+ VIEW COMPARISON:  Single-view of the chest 06/19/2015 and 07/06/2015. FINDINGS: There is lucency in the greater tuberosity. There appears to be a small focus of cortical disruption present. Mild appearing acromioclavicular degenerative change is seen. Dialysis catheter and feeding tube are noted. IMPRESSION: Small cortical defect in the greater tuberosity of the right humeral head and increased  lucency in the greater tuberosity could be due to infection or related to the patient's recent surgery. Projection is different than on prior chest x-rays but the findings appear more conspicuous. Electronically Signed   By: Inge Rise M.D.   On: 07/07/2015 17:24    I have reviewed the patient's current medications.  Assessment/Plan: 1 CKD/AKI/ESRD  Feel has ESRD will get access. HD yest. Low bps limiting but getting through 2 Anemia esa/Fe 3 Afib NSR onw 4 Renal bleed resolved 5 HPTH vit D 6 Sepis resolved P HD Sat, VVS to see.    LOS: 38 days   Reneka Nebergall L 07/08/2015,6:52 AM

## 2015-07-08 NOTE — Progress Notes (Signed)
Subjective: 3 Days Post-Op Procedure(s) (LRB): TRACHEOSTOMY (N/A) Patient reports pain as 3 on 0-10 scale.    Objective: Vital signs in last 24 hours: Temp:  [97.7 F (36.5 C)-98.8 F (37.1 C)] 97.9 F (36.6 C) (06/02 0339) Pulse Rate:  [62-88] 78 (06/02 0700) Resp:  [13-25] 20 (06/02 0700) BP: (69-100)/(44-76) 93/62 mmHg (06/02 0700) SpO2:  [97 %-100 %] 99 % (06/02 0700) Weight:  [107 kg (235 lb 14.3 oz)-107.8 kg (237 lb 10.5 oz)] 107.8 kg (237 lb 10.5 oz) (06/02 0500)  Intake/Output from previous day: 06/01 0701 - 06/02 0700 In: 773.5 [P.O.:200; I.V.:573.5] Out: 501 [Stool:1] Intake/Output this shift:     Recent Labs  07/06/15 0445 07/07/15 0525  HGB 8.2* 7.6*    Recent Labs  07/06/15 0445 07/07/15 0525  WBC 9.0 7.7  RBC 2.94* 2.69*  HCT 27.6* 25.2*  PLT 258 264    Recent Labs  07/07/15 0525 07/08/15 0522  NA 136 135  K 3.7 4.0  CL 98* 99*  CO2 26 27  BUN 69* 27*  CREATININE 4.27* 2.75*  GLUCOSE 113* 113*  CALCIUM 8.4* 8.2*    Recent Labs  07/07/15 0525 07/08/15 0523  INR 1.41 1.35    No cellulitis present  Assessment/Plan: 3 Days Post-Op Procedure(s) (LRB): TRACHEOSTOMY (N/A) Up with therapy.Courtesy visit. Defect in Greater Tuberosity is secondary to prior shoulder surgery in April.  Dima Mini A 07/08/2015, 7:27 AM

## 2015-07-09 DIAGNOSIS — G4733 Obstructive sleep apnea (adult) (pediatric): Secondary | ICD-10-CM | POA: Diagnosis present

## 2015-07-09 DIAGNOSIS — I469 Cardiac arrest, cause unspecified: Secondary | ICD-10-CM | POA: Diagnosis present

## 2015-07-09 DIAGNOSIS — N179 Acute kidney failure, unspecified: Secondary | ICD-10-CM | POA: Diagnosis present

## 2015-07-09 DIAGNOSIS — R578 Other shock: Secondary | ICD-10-CM | POA: Diagnosis present

## 2015-07-09 DIAGNOSIS — A4159 Other Gram-negative sepsis: Secondary | ICD-10-CM | POA: Diagnosis present

## 2015-07-09 DIAGNOSIS — I824Z1 Acute embolism and thrombosis of unspecified deep veins of right distal lower extremity: Secondary | ICD-10-CM | POA: Diagnosis present

## 2015-07-09 DIAGNOSIS — A414 Sepsis due to anaerobes: Secondary | ICD-10-CM | POA: Diagnosis present

## 2015-07-09 DIAGNOSIS — N189 Chronic kidney disease, unspecified: Secondary | ICD-10-CM

## 2015-07-09 DIAGNOSIS — Z905 Acquired absence of kidney: Secondary | ICD-10-CM | POA: Diagnosis present

## 2015-07-09 DIAGNOSIS — I5032 Chronic diastolic (congestive) heart failure: Secondary | ICD-10-CM | POA: Diagnosis present

## 2015-07-09 LAB — GLUCOSE, CAPILLARY
GLUCOSE-CAPILLARY: 136 mg/dL — AB (ref 65–99)
GLUCOSE-CAPILLARY: 136 mg/dL — AB (ref 65–99)
Glucose-Capillary: 112 mg/dL — ABNORMAL HIGH (ref 65–99)
Glucose-Capillary: 124 mg/dL — ABNORMAL HIGH (ref 65–99)
Glucose-Capillary: 158 mg/dL — ABNORMAL HIGH (ref 65–99)
Glucose-Capillary: 75 mg/dL (ref 65–99)

## 2015-07-09 LAB — CBC
HEMATOCRIT: 29.8 % — AB (ref 39.0–52.0)
HEMOGLOBIN: 8.6 g/dL — AB (ref 13.0–17.0)
MCH: 27.8 pg (ref 26.0–34.0)
MCHC: 28.9 g/dL — ABNORMAL LOW (ref 30.0–36.0)
MCV: 96.4 fL (ref 78.0–100.0)
Platelets: 289 10*3/uL (ref 150–400)
RBC: 3.09 MIL/uL — AB (ref 4.22–5.81)
RDW: 20.7 % — ABNORMAL HIGH (ref 11.5–15.5)
WBC: 11.4 10*3/uL — ABNORMAL HIGH (ref 4.0–10.5)

## 2015-07-09 LAB — MAGNESIUM: Magnesium: 2.1 mg/dL (ref 1.7–2.4)

## 2015-07-09 LAB — RENAL FUNCTION PANEL
Albumin: 2 g/dL — ABNORMAL LOW (ref 3.5–5.0)
Anion gap: 10 (ref 5–15)
BUN: 46 mg/dL — AB (ref 6–20)
CHLORIDE: 98 mmol/L — AB (ref 101–111)
CO2: 25 mmol/L (ref 22–32)
CREATININE: 4 mg/dL — AB (ref 0.61–1.24)
Calcium: 8.3 mg/dL — ABNORMAL LOW (ref 8.9–10.3)
GFR calc Af Amer: 16 mL/min — ABNORMAL LOW (ref >60)
GFR, EST NON AFRICAN AMERICAN: 14 mL/min — AB (ref >60)
Glucose, Bld: 136 mg/dL — ABNORMAL HIGH (ref 65–99)
Phosphorus: 3.5 mg/dL (ref 2.5–4.6)
Potassium: 4.6 mmol/L (ref 3.5–5.1)
Sodium: 133 mmol/L — ABNORMAL LOW (ref 135–145)

## 2015-07-09 LAB — HEPARIN LEVEL (UNFRACTIONATED): Heparin Unfractionated: 0.55 [IU]/mL (ref 0.30–0.70)

## 2015-07-09 LAB — BASIC METABOLIC PANEL
ANION GAP: 10 (ref 5–15)
BUN: 48 mg/dL — ABNORMAL HIGH (ref 6–20)
CHLORIDE: 99 mmol/L — AB (ref 101–111)
CO2: 25 mmol/L (ref 22–32)
Calcium: 8.4 mg/dL — ABNORMAL LOW (ref 8.9–10.3)
Creatinine, Ser: 4.24 mg/dL — ABNORMAL HIGH (ref 0.61–1.24)
GFR calc non Af Amer: 13 mL/min — ABNORMAL LOW (ref >60)
GFR, EST AFRICAN AMERICAN: 15 mL/min — AB (ref >60)
Glucose, Bld: 116 mg/dL — ABNORMAL HIGH (ref 65–99)
POTASSIUM: 4.4 mmol/L (ref 3.5–5.1)
Sodium: 134 mmol/L — ABNORMAL LOW (ref 135–145)

## 2015-07-09 LAB — PROTIME-INR
INR: 1.37 (ref 0.00–1.49)
PROTHROMBIN TIME: 17 s — AB (ref 11.6–15.2)

## 2015-07-09 MED ORDER — PENTAFLUOROPROP-TETRAFLUOROETH EX AERO: 1.0000 "application " | INHALATION_SPRAY | CUTANEOUS | Status: DC | PRN

## 2015-07-09 MED ORDER — SODIUM CHLORIDE 0.9 % IV SOLN
100.0000 mL | INTRAVENOUS | Status: DC | PRN
Start: 2015-07-09 — End: 2015-07-09

## 2015-07-09 MED ORDER — HEPARIN SODIUM (PORCINE) 1000 UNIT/ML DIALYSIS
100.0000 [IU]/kg | INTRAMUSCULAR | Status: DC | PRN
  Filled 2015-07-09: qty 11

## 2015-07-09 MED ORDER — HEPARIN SODIUM (PORCINE) 1000 UNIT/ML DIALYSIS: 1000.0000 [IU] | INTRAMUSCULAR | Status: DC | PRN

## 2015-07-09 MED ORDER — LIDOCAINE HCL (PF) 1 % IJ SOLN: 5.0000 mL | INTRAMUSCULAR | Status: DC | PRN

## 2015-07-09 MED ORDER — HEPARIN BOLUS VIA INFUSION
2000.0000 [IU] | Freq: Once | INTRAVENOUS | Status: AC
  Administered 2015-07-09: 2000 [IU] via INTRAVENOUS
  Filled 2015-07-09: qty 2000

## 2015-07-09 MED ORDER — LIDOCAINE-PRILOCAINE 2.5-2.5 % EX CREA: 1.0000 "application " | TOPICAL_CREAM | CUTANEOUS | Status: DC | PRN

## 2015-07-09 MED ORDER — SODIUM CHLORIDE 0.9 % IV SOLN: 100.0000 mL | INTRAVENOUS | Status: DC | PRN

## 2015-07-09 MED ORDER — ALTEPLASE 2 MG IJ SOLR: 2.0000 mg | Freq: Once | INTRAMUSCULAR | Status: DC | PRN

## 2015-07-09 MED ORDER — WARFARIN SODIUM 7.5 MG PO TABS
7.5000 mg | ORAL_TABLET | Freq: Once | ORAL | Status: AC
  Administered 2015-07-09: 7.5 mg via ORAL
  Filled 2015-07-09: qty 1

## 2015-07-09 NOTE — Progress Notes (Signed)
Vein map reviewed.  Has adequate cephalic and basilic vein bilaterally.  Will potentially place fistula next week depending on pt overall condition.  Recheck Monday  Ruta Hinds, MD [facility] Office: [phone] Pager: [phone]

## 2015-07-09 NOTE — Progress Notes (Signed)
ANTICOAGULATION CONSULT NOTE - Follow Up Consult  Pharmacy Consult for heparin Indication: DVT  Labs:  Recent Labs  07/07/15 0525 07/08/15 0522 07/08/15 0523 07/08/15 0746 07/09/15 0400  HGB 7.6*  --   --  8.5*  --   HCT 25.2*  --   --  29.2*  --   PLT 264  --   --  270  --   LABPROT 17.4*  --  16.8*  --  17.0*  INR 1.41  --  1.35  --  1.37  HEPARINUNFRC 0.55  --  0.43  --  <0.10*  CREATININE 4.27* 2.75*  --   --  4.00*     Assessment/Plan:  Heparin has been off for extended period overnight for procedure, now resuming.  Will give heparin 2000 units IV bolus and resume at prior rate of 1450 units/hr and check level in [place], PharmD, BCPS  07/09/2015,5:27 AM

## 2015-07-09 NOTE — Progress Notes (Signed)
Pt does not wish to wear CPAP tonight, pt has NG in and does not think he will tolerate it. RT will continue to monitor.

## 2015-07-09 NOTE — Progress Notes (Signed)
Pt is sleeping comfortably at this time no distress or complications noted, didn't awake patient for readiness for CPAP. NG tube in nose also. Pt is stable at this time.

## 2015-07-09 NOTE — Progress Notes (Signed)
PROGRESS NOTE    [name]  CVE:[id number] DOB: October 03, 1940 DOA: 05/31/2015 PCP: Melinda Crutch   Brief Narrative:  [name] is a 75 y.o. WM PMHx Pulmonary emboli 2008,DVT (deep venous thrombosis), HTN, Dysrhythmia, HLD, DM type II with complication, Prostate CA s/p radiation 2011 and RCC s/p Lt Nephrectomy.   He was seen in ED 4/22 for clogged catheter which was irrigated and replaced. He apparently has had hematuria for quite some time which has been attributed to scarring from prior radiation.  He had been in his USOH until roughly 4/20 when he had decreased appetite. He had not been eating or drinking for the past 5 days. On 04/25 he went to have his INR checked (on coumadin for A.fib and hx DVT / PE) and was found to have INR of 8 and was hypotensive with SBP 70/44. He was subsequently sent to ED for further evaluation.  In ED, he was found to have UTI and despite 4.5L IVF, he remained hypotensive. He also had multiple metabolic derangements including Na 124, K 5.0, AGMA, lactate 3, SCr 9.36. PCCM was therefore consulted for admission of septic shock due to urosepsis as well as AKI.    Assessment & Plan:   Active Problems:   Essential hypertension   Gross hematuria   Atrial fibrillation (HCC)   Septic shock (HCC)   Urinary tract infectious disease   Encounter for central line placement   Acute renal failure (HCC)   Dyspnea   Bacteremia due to Klebsiella pneumoniae   Secondary cardiomyopathy ([place])   Chronic atrial fibrillation ([place])   Uncontrolled type 2 diabetes mellitus with complication (HCC)   Perinephric hematoma   Right flank pain   Acute respiratory failure (HCC)   Acute back pain   Bleeding   Chest tube in place   Acute hypoxemic respiratory failure (HCC)   Acute renal failure (ARF) (HCC)   Acute on chronic renal failure ([place])   Hemorrhagic shock   S/p nephrectomy   Sepsis due to Klebsiella ([place])   Acute Hypoxic Respiratory Failure  - Post  Cardiac Arrest. Resolved  Pneumothorax after CPR 5/03 - chest tubes removed 5/14.  OSA. -CPAP per respiratory  Cardiac Arrest  Chronic diastolic CHF -Strict in and out since admission -4.1 L -Daily weight Filed Weights   07/08/15 0500 07/09/15 0433 07/09/15 1300  Weight: 107.8 kg (237 lb 10.5 oz) 107.3 kg (236 lb 8.9 oz) 109.6 kg (241 lb 10 oz)    Hemorrhagic Shock/Acute Blood loss Anemia  -secondary to Rt Perinephric Hematoma.   Recent Labs Lab 07/05/15 0425 07/06/15 0445 07/07/15 0525 07/08/15 0746 07/09/15 0941  HGB 7.9* 8.2* 7.6* 8.5* 8.6*  -Resolved  Leukocytosis  - Unclear etiology. Improving.  Right Gastocnemious vein acute DVT/DVT Right femoral & popliteal veins -Treatment dose heparin. Monitor hemoglobin closely secondary to patient's episode of hemorrhagic shock.  Ischemic Changes Left Foot  - Slightly worse. Arterial duplex w/o notable findings. vasoplegia -Continue heparin gtt / coumadin  -PT/OT consulted  Acute on Chronic Renal Failure  Lab Results  Component Value Date   CREATININE 4.24* 07/09/2015   CREATININE 4.00* 07/09/2015   CREATININE 2.75* 07/08/2015  -Volume removal per HD  H/O Left Nephrectomy for RCC  Dysphagia  Sepsis/positive Klebsiella Bacteremia  - Completed Antibiotic course 5/10.  DM type 2 uncontrolled  With complication -5/2 WCH8N= 7.8  Acute Encephalopathy  -Fentanyl IV prn -Repeat xray right shoulder    DVT prophylaxis: Treatment dose heparin--> Coumadin Code Status: Full  Family Communication: None Disposition Plan: CIR vs SNF   Consultants:    Procedures/Significant Events:  4/26 Renal u/s >> s/p Lt nephrectomy 4/27 CT abd/pelvis >> Rt perinephric hematoma 5/02 CT abd/pelvis >> increased size of hematoma 5/08 Echo >> EF 65 to 70% 5/18 Doppler legs b/l >> Acute DVT Rt gastrocnemius vein, age indeterminate DVT Rt femoral vein and popliteal vein 5/22 Port CXR: Support lines and tubes remain in position.  Low lung volumes. Partial silhouetting of left hemidiaphragm without obvious opacity. 5/23 TTE: Moderate LVH. EF 65-70%. No wall motion abnormalities. LA mildly dilated. No AS or AR. No MR. RV normal in size & function. No effusion. 5/25 LLE Arterial Duplex: Prelim report. No evidence of stenosis or occluded arteries. Triphasic blood flow throughout left leg.  4/25 Admitted with septic shock due to UTI. Significant ARF. 4/30 transferred out of ICU. 5/03 Cardiac arrest, anemia >> coil embolization Rt kidney by IR; PTX post-CPR 5/16 Reintubated due to airway secretions 5/18 start heparin gtt 5/27- extubated 5/28 off cvvhd   Cultures 4/25 Blood >> positive Klebsiella pneumoniae 4/27 Blood >> negative 5/16 BAL >> oral flora 5/27 Trach Asp: Positive few gpc pairs; chains; few GPR, rare GNR 5/27 Blood Ctx >>  Antimicrobials: 4/28 Rocephin >> 5/10   Devices    LINES / TUBES:  Rt IJ HD cath 4/26 - 5/12 ETT 5/3 - 5/13; 5/16 - 5/27 Lt femoral CVL 5/3 - 5/17 Lt chest tube 5/3 - 5/14 Rt chest tube 5/3 - 5/14 Rt IJ tunneled HD cath 5/12 >>  Lt [place] CVL 5/17 >>  NGT 5/8 >> PIV x1    Continuous Infusions: . sodium chloride 10 mL/hr at 07/09/15 0600  . heparin 1,450 Units/hr (07/09/15 0600)  . norepinephrine (LEVOPHED) Adult infusion Stopped (07/07/15 1509)     Subjective: 6/3  A/O 4, Laying comfortably in bed receiving HD. Negative SOB, negative abdominal/CVA tenderness, negative leg pain.   Objective: Filed Vitals:   07/09/15 1530 07/09/15 1601 07/09/15 1629 07/09/15 1659  BP: 103/72 102/66 107/64 112/64  Pulse: 67 70 80 68  Temp:      TempSrc:      Resp: '21 21 18 20  ' Height:      Weight:      SpO2: 99% 99% 100% 98%    Intake/Output Summary (Last 24 hours) at 07/09/15 1714 Last data filed at 07/09/15 1300  Gross per 24 hour  Intake 661.52 ml  Output      0 ml  Net 661.52 ml   Filed Weights   07/08/15 0500 07/09/15 0433 07/09/15 1300  Weight: 107.8 kg  (237 lb 10.5 oz) 107.3 kg (236 lb 8.9 oz) 109.6 kg (241 lb 10 oz)    Examination:  General:   A/O 4, Laying in bed comfortably.No acute respiratory distress Eyes: negative scleral hemorrhage, negative anisocoria, negative icterus ENT: Negative Runny nose, negative gingival bleeding, CorTrak tube present Neck:  Negative scars, masses, torticollis, lymphadenopathy, JVD Lungs: Clear to auscultation bilaterally without wheezes or crackles. Right chest wall HD cath present, negative sign of infection Cardiovascular: Regular rate and rhythm without murmur gallop or rub normal S1 and S2 Abdomen: negative abdominal pain, nondistended, positive soft, bowel sounds, no rebound, no ascites, no appreciable mass Extremities: No significant cyanosis, clubbing, or edema bilateral lower extremities Skin: Negative rashes, lesions, ulcers Psychiatric:  Negative depression, negative anxiety, negative fatigue, negative mania  Central nervous system:  Cranial nerves II through XII intact, tongue/uvula midline, all extremities muscle strength 5/5,  sensation intact throughout,  negative dysarthria, negative expressive aphasia, negative receptive aphasia. .     Data Reviewed: Care during the described time interval was provided by me .  I have reviewed this patient's available data, including medical history, events of note, physical examination, and all test results as part of my evaluation. I have personally reviewed and interpreted all radiology studies.  CBC:  Recent Labs Lab 07/04/15 0355 07/05/15 0425 07/06/15 0445 07/07/15 0525 07/08/15 0746 07/09/15 0941  WBC 8.6 8.2 9.0 7.7 11.2* 11.4*  NEUTROABS 6.3  --  7.4 6.4 9.5*  --   HGB 7.9* 7.9* 8.2* 7.6* 8.5* 8.6*  HCT 27.8* 26.8* 27.6* 25.2* 29.2* 29.8*  MCV 96.5 93.7 93.9 93.7 97.3 96.4  PLT 228 253 258 264 270 858   Basic Metabolic Panel:  Recent Labs Lab 07/03/15 0411  07/04/15 0355 07/05/15 0425 07/06/15 0445 07/07/15 0525  07/08/15 0522 07/08/15 0746 07/09/15 0400 07/09/15 0941  NA 137  < > 137 136 137 136 135  --  133* 134*  K 4.2  < > 4.4 4.5 3.8 3.7 4.0  --  4.6 4.4  CL 102  < > 102 102 99* 98* 99*  --  98* 99*  CO2 27  < > '26 23 27 26 27  ' --  25 25  GLUCOSE 151*  < > 177* 201* 173* 113* 113*  --  136* 116*  BUN 26*  < > 48* 101* 42* 69* 27*  --  46* 48*  CREATININE 1.20  < > 2.11* 3.71* 2.68* 4.27* 2.75*  --  4.00* 4.24*  CALCIUM 8.5*  < > 8.5* 8.6* 8.2* 8.4* 8.2*  --  8.3* 8.4*  MG 2.5*  --  2.5* 2.5*  --   --   --  2.1  --  2.1  PHOS 3.0  < > 3.3 4.0 4.0 5.2* 2.7  --  3.5  --   < > = values in this interval not displayed. GFR: Estimated Creatinine Clearance: 18.9 mL/min (by C-G formula based on Cr of 4.24). Liver Function Tests:  Recent Labs Lab 07/05/15 0425 07/06/15 0445 07/07/15 0525 07/08/15 0522 07/09/15 0400  AST 41  --   --   --   --   ALT 31  --   --   --   --   ALKPHOS 128*  --   --   --   --   BILITOT 1.2  --   --   --   --   PROT 6.2*  --   --   --   --   ALBUMIN 2.0* 2.1* 2.1* 2.1* 2.0*   No results for input(s): LIPASE, AMYLASE in the last 168 hours. No results for input(s): AMMONIA in the last 168 hours. Coagulation Profile:  Recent Labs Lab 07/06/15 1318 07/07/15 0525 07/08/15 0523 07/09/15 0400  INR 1.31 1.41 1.35 1.37   Cardiac Enzymes: No results for input(s): CKTOTAL, CKMB, CKMBINDEX, TROPONINI in the last 168 hours. BNP (last 3 results) No results for input(s): PROBNP in the last 8760 hours. HbA1C: No results for input(s): HGBA1C in the last 72 hours. CBG:  Recent Labs Lab 07/08/15 1950 07/08/15 2337 07/09/15 0338 07/09/15 0750 07/09/15 1157  GLUCAP 211* 112* 158* 124* 136*   Lipid Profile: No results for input(s): CHOL, HDL, LDLCALC, TRIG, CHOLHDL, LDLDIRECT in the last 72 hours. Thyroid Function Tests: No results for input(s): TSH, T4TOTAL, FREET4, T3FREE, THYROIDAB in the last 72 hours. Anemia Panel: No  results for input(s): VITAMINB12,  FOLATE, FERRITIN, TIBC, IRON, RETICCTPCT in the last 72 hours. Urine analysis:    Component Value Date/Time   COLORURINE RED* 05/31/2015 1158   APPEARANCEUR TURBID* 05/31/2015 1158   LABSPEC 1.021 05/31/2015 1158   PHURINE 7.5 05/31/2015 1158   GLUCOSEU NEGATIVE 05/31/2015 1158   HGBUR LARGE* 05/31/2015 1158   BILIRUBINUR LARGE* 05/31/2015 1158   KETONESUR 40* 05/31/2015 1158   PROTEINUR >300* 05/31/2015 1158   UROBILINOGEN 0.2 03/06/2013 1748   NITRITE POSITIVE* 05/31/2015 1158   LEUKOCYTESUR LARGE* 05/31/2015 1158   Sepsis Labs: '@LABRCNTIP' (procalcitonin:4,lacticidven:4)  ) Recent Results (from the past 240 hour(s))  Culture, blood (routine x 2)     Status: None   Collection Time: 07/02/15 12:02 PM  Result Value Ref Range Status   Specimen Description BLOOD RIGHT ANTECUBITAL  Final   Special Requests BOTTLES DRAWN AEROBIC ONLY 5CC  Final   Culture NO GROWTH 5 DAYS  Final   Report Status 07/07/2015 FINAL  Final  Culture, blood (routine x 2)     Status: None   Collection Time: 07/02/15 12:09 PM  Result Value Ref Range Status   Specimen Description BLOOD RIGHT HAND  Final   Special Requests IN PEDIATRIC BOTTLE 1CC  Final   Culture NO GROWTH 5 DAYS  Final   Report Status 07/07/2015 FINAL  Final  Culture, respiratory (NON-Expectorated)     Status: None   Collection Time: 07/02/15  1:40 PM  Result Value Ref Range Status   Specimen Description TRACHEAL ASPIRATE  Final   Special Requests Normal  Final   Gram Stain   Final    ABUNDANT WBC PRESENT, PREDOMINANTLY PMN NO SQUAMOUS EPITHELIAL CELLS SEEN MODERATE GRAM POSITIVE COCCI IN PAIRS IN CHAINS FEW GRAM POSITIVE RODS RARE GRAM NEGATIVE RODS    Culture Consistent with normal respiratory flora.  Final   Report Status 07/07/2015 FINAL  Final  C difficile quick scan w PCR reflex     Status: None   Collection Time: 07/07/15  8:41 PM  Result Value Ref Range Status   C Diff antigen NEGATIVE NEGATIVE Final   C Diff toxin  NEGATIVE NEGATIVE Final   C Diff interpretation Negative for toxigenic C. difficile  Final         Radiology Studies: No results found.      Scheduled Meds: . antiseptic oral rinse  7 mL Mouth Rinse QID  . calcitRIOL  0.25 mcg Per Tube Q48H  . chlorhexidine gluconate (SAGE KIT)  15 mL Mouth Rinse BID  . darbepoetin (ARANESP) injection - DIALYSIS  200 mcg Intravenous Q Tue-HD  . heparin  40 Units/kg Dialysis Once in dialysis  . hydrocortisone sodium succinate  50 mg Intravenous Q6H  . insulin aspart  0-15 Units Subcutaneous Q4H  . insulin NPH Human  13 Units Subcutaneous BID AC & HS  . midodrine  10 mg Oral TID AC  . sodium chloride flush  10-40 mL Intracatheter Q12H  . sucroferric oxyhydroxide  500 mg Oral TID WC  . warfarin  7.5 mg Oral ONCE-1800  . Warfarin - Pharmacist Dosing Inpatient   Does not apply q1800   Continuous Infusions: . sodium chloride 10 mL/hr at 07/09/15 0600  . heparin 1,450 Units/hr (07/09/15 0600)  . norepinephrine (LEVOPHED) Adult infusion Stopped (07/07/15 1509)     LOS: 39 days    Time spent: 40 minutes    WOODS, Geraldo Docker, MD [facility] Pager [phone]   If 7PM-7AM, please contact night-coverage [email or web address]  Password [email or web address] 07/09/2015, 5:14 PM

## 2015-07-09 NOTE — Progress Notes (Signed)
ANTICOAGULATION CONSULT NOTE - Follow-up Consult  Pharmacy Consult for heparin and warfarin Indication: DVT  No Known Allergies  Patient Measurements: Height: 5\' 9"  (175.3 cm) Weight: 241 lb 10 oz (109.6 kg) (bedscale) IBW/kg (Calculated) : 70.7 Heparin Dosing Weight: 97.5 kg  Vital Signs: Temp: 97.9 F (36.6 C) (06/03 1300) Temp Source: Oral (06/03 1300) BP: 103/72 mmHg (06/03 1530) Pulse Rate: 67 (06/03 1530)  Labs:  Recent Labs  07/07/15 0525 07/08/15 0522 07/08/15 0523 07/08/15 0746 07/09/15 0400 07/09/15 0941 07/09/15 1448  HGB 7.6*  --   --  8.5*  --  8.6*  --   HCT 25.2*  --   --  29.2*  --  29.8*  --   PLT 264  --   --  270  --  289  --   LABPROT 17.4*  --  16.8*  --  17.0*  --   --   INR 1.41  --  1.35  --  1.37  --   --   HEPARINUNFRC 0.55  --  0.43  --  <0.10*  --  0.55  CREATININE 4.27* 2.75*  --   --  4.00* 4.24*  --     Estimated Creatinine Clearance: 18.9 mL/min (by C-G formula based on Cr of 4.24).  Assessment: 75 y.o. male admitted on 05/31/2015 with sepsis. Transferred to ICU s/p cardiac arrest with hemorrhagic and septic shock on 5/3  PMH: prostate ca s/p radiation 2011, renal cell cancer s/p left nephrectomy, afib/hx of dvt on warfarin (home dose 6 mg MWF and 5 mg all other days)  Currently on IV heparin for acute DVT RLE with a known history of DVTs and atrial fibrillation and resuming warfarin. Heparin level is therapeutic at 0.55 on 1450 units/hr. INR subtherapeutic at 1.37 on day 4 heparin bridge. No bleeding noted.  Hemoglobin and platelet count are stable. PO intake continues to be low.  Goal of Therapy:  Heparin level 0.3-0.7 units/mL  Monitor platelets by anticoagulation protocol: Yes   Plan:  Continue heparin infusion at 1450 units/hr Daily heparin level, CBC Warfarin 7.5 mg po x1 today.  Daily PT/INR - monitor closely with low po intake Monitor s/sx of bleeding   Legrand Como, Pharm.D., BCPS, AAHIVP Clinical  Pharmacist Phone: [phone] or [phone] 07/09/2015, 3:50 PM

## 2015-07-09 NOTE — Progress Notes (Signed)
Subjective: Interval History: has no complaint.  Objective: Vital signs in last 24 hours: Temp:  [98.4 F (36.9 C)-99.3 F (37.4 C)] 98.5 F (36.9 C) (06/03 0336) Pulse Rate:  [60-88] 65 (06/03 0600) Resp:  [14-28] 18 (06/03 0600) BP: (74-129)/(56-81) 94/66 mmHg (06/03 0600) SpO2:  [97 %-100 %] 98 % (06/03 0600) Weight:  [107.3 kg (236 lb 8.9 oz)] 107.3 kg (236 lb 8.9 oz) (06/03 0433) Weight change: -0.2 kg (-7.1 oz)  Intake/Output from previous day: 06/02 0701 - 06/03 0700 In: 505 [I.V.:505] Out: -  Intake/Output this shift:    General appearance: alert, cooperative, no distress, moderately obese and pale Resp: clear to auscultation bilaterally and R chest wall PC Cardio: S1, S2 normal and systolic murmur: holosystolic 2/6, blowing at apex GI: obese,pos bs, liver down 6 cm Extremities: edema 1+  Lab Results:  Recent Labs  07/07/15 0525 07/08/15 0746  WBC 7.7 11.2*  HGB 7.6* 8.5*  HCT 25.2* 29.2*  PLT 264 270   BMET:  Recent Labs  07/08/15 0522 07/09/15 0400  NA 135 133*  K 4.0 4.6  CL 99* 98*  CO2 27 25  GLUCOSE 113* 136*  BUN 27* 46*  CREATININE 2.75* 4.00*  CALCIUM 8.2* 8.3*   No results for input(s): PTH in the last 72 hours. Iron Studies: No results for input(s): IRON, TIBC, TRANSFERRIN, FERRITIN in the last 72 hours.  Studies/Results: Dg Shoulder Right  07/07/2015  CLINICAL DATA:  Right shoulder pain. Recent rotator cuff surgery. No known injury. EXAM: RIGHT SHOULDER - 2+ VIEW COMPARISON:  Single-view of the chest 06/19/2015 and 07/06/2015. FINDINGS: There is lucency in the greater tuberosity. There appears to be a small focus of cortical disruption present. Mild appearing acromioclavicular degenerative change is seen. Dialysis catheter and feeding tube are noted. IMPRESSION: Small cortical defect in the greater tuberosity of the right humeral head and increased lucency in the greater tuberosity could be due to infection or related to the patient's  recent surgery. Projection is different than on prior chest x-rays but the findings appear more conspicuous. Electronically Signed   By: Inge Rise M.D.   On: 07/07/2015 17:24    I have reviewed the patient's current medications.  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  BP limits some but able to accomplish 2 Anemia esa, Fe 3 Sepsis resolved 4 Hypotension on mido, steroids 5 swallowing dysfct stable advance diet 6 Renal bleed stable 7 HPTH on Vit D P HD, esa, Fe, mido, lower steroids per CCM, mobilize, get access next week.    LOS: 39 days   Berdina Cheever L 07/09/2015,7:02 AM

## 2015-07-10 DIAGNOSIS — G4733 Obstructive sleep apnea (adult) (pediatric): Secondary | ICD-10-CM | POA: Diagnosis present

## 2015-07-10 DIAGNOSIS — Z9989 Dependence on other enabling machines and devices: Secondary | ICD-10-CM

## 2015-07-10 DIAGNOSIS — S270XXA Traumatic pneumothorax, initial encounter: Secondary | ICD-10-CM

## 2015-07-10 LAB — CBC
HEMATOCRIT: 29.5 % — AB (ref 39.0–52.0)
HEMOGLOBIN: 8.7 g/dL — AB (ref 13.0–17.0)
MCH: 28.3 pg (ref 26.0–34.0)
MCHC: 29.5 g/dL — ABNORMAL LOW (ref 30.0–36.0)
MCV: 96.1 fL (ref 78.0–100.0)
Platelets: 241 10*3/uL (ref 150–400)
RBC: 3.07 MIL/uL — AB (ref 4.22–5.81)
RDW: 21 % — ABNORMAL HIGH (ref 11.5–15.5)
WBC: 15.7 10*3/uL — AB (ref 4.0–10.5)

## 2015-07-10 LAB — RENAL FUNCTION PANEL
ANION GAP: 11 (ref 5–15)
Albumin: 2.2 g/dL — ABNORMAL LOW (ref 3.5–5.0)
BUN: 20 mg/dL (ref 6–20)
CALCIUM: 8.2 mg/dL — AB (ref 8.9–10.3)
CO2: 26 mmol/L (ref 22–32)
CREATININE: 2.6 mg/dL — AB (ref 0.61–1.24)
Chloride: 95 mmol/L — ABNORMAL LOW (ref 101–111)
GFR, EST AFRICAN AMERICAN: 26 mL/min — AB (ref >60)
GFR, EST NON AFRICAN AMERICAN: 23 mL/min — AB (ref >60)
Glucose, Bld: 99 mg/dL (ref 65–99)
Phosphorus: 2.5 mg/dL (ref 2.5–4.6)
Potassium: 3.7 mmol/L (ref 3.5–5.1)
SODIUM: 132 mmol/L — AB (ref 135–145)

## 2015-07-10 LAB — GLUCOSE, CAPILLARY
GLUCOSE-CAPILLARY: 138 mg/dL — AB (ref 65–99)
Glucose-Capillary: 115 mg/dL — ABNORMAL HIGH (ref 65–99)
Glucose-Capillary: 167 mg/dL — ABNORMAL HIGH (ref 65–99)
Glucose-Capillary: 212 mg/dL — ABNORMAL HIGH (ref 65–99)
Glucose-Capillary: 162 mg/dL — ABNORMAL HIGH (ref 65–99)
Glucose-Capillary: 197 mg/dL — ABNORMAL HIGH (ref 65–99)

## 2015-07-10 LAB — PROTIME-INR
INR: 1.58 — AB (ref 0.00–1.49)
PROTHROMBIN TIME: 18.9 s — AB (ref 11.6–15.2)

## 2015-07-10 LAB — HEPARIN LEVEL (UNFRACTIONATED): HEPARIN UNFRACTIONATED: 0.55 [IU]/mL (ref 0.30–0.70)

## 2015-07-10 MED ORDER — WARFARIN SODIUM 5 MG PO TABS
7.5000 mg | ORAL_TABLET | Freq: Once | ORAL | Status: AC
  Administered 2015-07-10: 7.5 mg via ORAL
  Filled 2015-07-10: qty 2

## 2015-07-10 MED ORDER — LORAZEPAM 2 MG/ML IJ SOLN
0.5000 mg | Freq: Once | INTRAMUSCULAR | Status: AC
  Administered 2015-07-10: 0.5 mg via INTRAVENOUS
  Filled 2015-07-10: qty 1

## 2015-07-10 MED ORDER — CETYLPYRIDINIUM CHLORIDE 0.05 % MT LIQD
7.0000 mL | Freq: Two times a day (BID) | OROMUCOSAL | Status: DC
  Administered 2015-07-10 – 2015-07-31 (×32): 7 mL via OROMUCOSAL

## 2015-07-10 MED ORDER — TAMSULOSIN HCL 0.4 MG PO CAPS
0.4000 mg | ORAL_CAPSULE | Freq: Every day | ORAL | Status: DC
  Administered 2015-07-10 – 2015-07-20 (×11): 0.4 mg via ORAL
  Filled 2015-07-10 (×11): qty 1

## 2015-07-10 NOTE — Progress Notes (Signed)
Patient c/o not able to void bladder scan done and less than 200 cc detected patient questions about bladder spasm. He also has scant amount of bloody drainage from penis PA notified and will continue to monitor. Patient has high level of anxiety at this I will continue to monitor.

## 2015-07-10 NOTE — Progress Notes (Signed)
Patient c/o lower abd pain, unable to urinate. Had been having spotting of blood from penis. This morning patient had mod amount of blood from penis and moderate sized clots. MD paged. New orders received for urinary catheter insertion. Will cont to monitor.

## 2015-07-10 NOTE — Progress Notes (Signed)
Subjective: Interval History: has complaints lower abdm pain better with foley still sore.  Objective: Vital signs in last 24 hours: Temp:  [97 F (36.1 C)-98.6 F (37 C)] 98.6 F (37 C) (06/04 0708) Pulse Rate:  [61-106] 106 (06/04 0708) Resp:  [16-29] 21 (06/04 0708) BP: (82-139)/(59-99) 131/99 mmHg (06/04 0708) SpO2:  [96 %-100 %] 96 % (06/04 0708) Weight:  [107.8 kg (237 lb 10.5 oz)-110.7 kg (244 lb 0.8 oz)] 110.7 kg (244 lb 0.8 oz) (06/04 0500) Weight change: 2.3 kg (5 lb 1.1 oz)  Intake/Output from previous day: 06/03 0701 - 06/04 0700 In: 893.5 [P.O.:480; I.V.:413.5] Out: 1000  Intake/Output this shift: Total I/O In: 30 [NG/GT:30] Out: -   General appearance: alert, cooperative, moderately obese and pale Resp: diminished breath sounds bilaterally Chest wall: RIJ cath Cardio: S1, S2 normal and systolic murmur: holosystolic 2/6, blowing at apex GI: obese, pos bs, mild lower abdm tender Extremities: edema 1+  Lab Results:  Recent Labs  07/08/15 0746 07/09/15 0941  WBC 11.2* 11.4*  HGB 8.5* 8.6*  HCT 29.2* 29.8*  PLT 270 289   BMET:  Recent Labs  07/09/15 0941 07/10/15 0529  NA 134* 132*  K 4.4 3.7  CL 99* 95*  CO2 25 26  GLUCOSE 116* 99  BUN 48* 20  CREATININE 4.24* 2.60*  CALCIUM 8.4* 8.2*   No results for input(s): PTH in the last 72 hours. Iron Studies: No results for input(s): IRON, TIBC, TRANSFERRIN, FERRITIN in the last 72 hours.  Studies/Results: No results found.  I have reviewed the patient's current medications.  Assessment/Plan: 1 CKD/AKI HC TTS needs access 2 Hematuria may need irrigation? Coming from Prostate or recent Renal bleed 3 Kleb bact improved 4 low bps some better. On steroids , mido 5 Anemia stable on esa/fe 6 HPtH on vit D 7 debill P foley per primary, HD TTS, perm access.    LOS: 40 days   Lucy Boardman L 07/10/2015,9:14 AM

## 2015-07-10 NOTE — Progress Notes (Signed)
Patient with multiple clots in foley. Urinary catheter removed, replaced with 3 way irrigation urinary catheter to allow for irrigation of bladder. Peri care performed prior to re-insertion of catheter. Sterile technique maintained. Bloody urine returned.

## 2015-07-10 NOTE — Progress Notes (Signed)
PROGRESS NOTE    [name]  [name] DOB: 1940/09/24 DOA: 05/31/2015 PCP: Melinda Crutch   Brief Narrative:  [name] is a 75 y.o. WM PMHx Pulmonary emboli 2008,DVT (deep venous thrombosis), HTN, Dysrhythmia, HLD, DM type II with complication, Prostate CA s/p radiation 2011 and RCC s/p Lt Nephrectomy.   He was seen in ED 4/22 for clogged catheter which was irrigated and replaced. He apparently has had hematuria for quite some time which has been attributed to scarring from prior radiation.  He had been in his USOH until roughly 4/20 when he had decreased appetite. He had not been eating or drinking for the past 5 days. On 04/25 he went to have his INR checked (on coumadin for A.fib and hx DVT / PE) and was found to have INR of 8 and was hypotensive with SBP 70/44. He was subsequently sent to ED for further evaluation.  In ED, he was found to have UTI and despite 4.5L IVF, he remained hypotensive. He also had multiple metabolic derangements including Na 124, K 5.0, AGMA, lactate 3, SCr 9.36. PCCM was therefore consulted for admission of septic shock due to urosepsis as well as AKI.    Assessment & Plan:   Active Problems:   Essential hypertension   Gross hematuria   Atrial fibrillation (HCC)   Septic shock (HCC)   Urinary tract infectious disease   Encounter for central line placement   Acute renal failure (HCC)   Dyspnea   Bacteremia due to Klebsiella pneumoniae   Secondary cardiomyopathy ([place])   Chronic atrial fibrillation ([place])   Uncontrolled type 2 diabetes mellitus with complication (HCC)   Perinephric hematoma   Right flank pain   Acute respiratory failure (HCC)   Acute back pain   Bleeding   Chest tube in place   Acute hypoxemic respiratory failure (HCC)   Acute renal failure (ARF) (HCC)   Acute on chronic renal failure ([place])   Hemorrhagic shock   S/p nephrectomy   Sepsis due to Klebsiella (HCC)   OSA (obstructive sleep apnea)   Chronic diastolic  CHF (congestive heart failure) ([place])   Cardiac arrest (HCC)   Acute deep vein thrombosis (DVT) of distal vein of right lower extremity (HCC)   Acute Hypoxic Respiratory Failure  - Post Cardiac Arrest. Resolved  Pneumothorax after CPR 5/03 - chest tubes removed 5/14.  OSA. On CPAP -CPAP per respiratory  Cardiac Arrest  Chronic diastolic CHF -Strict in and out since admission - 3.3 L -Daily weight Filed Weights   07/09/15 1716 07/09/15 2047 07/10/15 0500  Weight: 108.7 kg (239 lb 10.2 oz) 107.8 kg (237 lb 10.5 oz) 110.7 kg (244 lb 0.8 oz)    Hemorrhagic Shock/Acute Blood loss Anemia  -secondary to Rt Perinephric Hematoma.  -Resolved Recent Labs Lab 07/05/15 0425 07/06/15 0445 07/07/15 0525 07/08/15 0746 07/09/15 0941  HGB 7.9* 8.2* 7.6* 8.5* 8.6*    Leukocytosis  - Unclear etiology. Patient clinically improved  Right Gastocnemious vein Acute DVT/DVT Right femoral & popliteal veins -Treatment dose heparin. Monitor hemoglobin closely secondary to patient's episode of hemorrhagic shock. -Hemoglobin stable  Ischemic Changes Left Foot  - Slightly worse. Arterial duplex w/o notable findings. vasoplegia -Continue heparin gtt / coumadin  -PT/OT; recommend CIR /LTAC   Acute on Chronic Renal Failure(now HD per nephrology)  Lab Results  Component Value Date   CREATININE 2.60* 07/10/2015   CREATININE 4.24* 07/09/2015   CREATININE 4.00* 07/09/2015  -NOTE; patient has used catheter for 10years, with  occasional hematuria -Volume removal per HD  H/O Left Nephrectomy for RCC  Hematuria -Patient and wife understand must continue with anticoagulation secondary to risks of DVT propagating -Flush Foley catheter q 2hr/PRN  Dysphagia -Passed swallow evaluation dysphagia 2 diet nectar thick liquid  Sepsis/positive Klebsiella Bacteremia  - Completed Antibiotic course 5/10.  DM type 2 uncontrolled  With complication -5/2 [id number]  Acute Encephalopathy  -Fentanyl IV  prn  Goals of care -Initiate consult to CIR -Ambulate patient q shift with assistance   DVT prophylaxis: Treatment dose heparin--> Coumadin Code Status: Full Family Communication: None Disposition Plan: CIR vs SNF   Consultants:  Dr. Jeneen Rinks D[place] nephrology Dr.Charles E Fields vascular surgery Dr. Latanya Maudlin orthopedics Dr.Benjamin Ardis Hughs urology Dr Rigoberto Noel.  PC CM    Procedures/Significant Events:  4/26 Renal u/s >> s/p Lt nephrectomy 4/27 CT abd/pelvis >> Rt perinephric hematoma 5/02 CT abd/pelvis >> increased size of hematoma 5/03 Cardiac arrest, anemia >> coil embolization Rt kidney by IR; PTX post-CPR 5/08 Echo >> EF 65 to 70% 5/16 Reintubated due to airway secretions 5/18 Doppler legs b/l >> Acute DVT Rt gastrocnemius vein, age indeterminate DVT Rt femoral vein and popliteal vein 5/18 start heparin gtt 5/22 Port CXR: Support lines and tubes remain in position. Low lung volumes. Partial silhouetting of left hemidiaphragm without obvious opacity. 5/23 TTE: Moderate LVH. EF 65-70%. No wall motion abnormalities. LA mildly dilated. No AS or AR. No MR. RV normal in size & function. No effusion. 5/25 LLE Arterial Duplex: Prelim report. No evidence of stenosis or occluded arteries. Triphasic blood flow throughout left leg. 5/27- extubated 5/28 off cvvhd 6/4 CorTrak tube removed   Cultures 4/25 Blood >> positive Klebsiella pneumoniae 4/27 Blood >> negative 5/16 BAL >> oral flora 5/27 Trach Asp: Negative final 5/27 Blood right AC/hand negative final   Antimicrobials: 4/28 Rocephin >> 5/10   Devices    LINES / TUBES:  Rt IJ HD cath 4/26 - 5/12 ETT 5/3 - 5/13; 5/16 - 5/27 Lt femoral CVL 5/3 - 5/17 Lt chest tube 5/3 - 5/14 Rt chest tube 5/3 - 5/14 Rt IJ tunneled HD cath 5/12 >>  Lt [place] CVL 5/17 >>  NGT 5/8 >> 6/4 PIV x1    Continuous Infusions: . sodium chloride 10 mL/hr at 07/09/15 0600  . heparin 1,450 Units/hr (07/10/15 0200)      Subjective: 6/4  A/O 4, Laying comfortably in bed. Patient's voice was stronger, states ate breakfast without choking, N/V.  Negative SOB, negative abdominal/CVA tenderness, negative leg pain.   Objective: Filed Vitals:   07/09/15 2347 07/09/15 2350 07/10/15 0434 07/10/15 0500  BP: 121/85 121/85 139/95   Pulse: 78 70 83   Temp: 97.9 F (36.6 C)  98.1 F (36.7 C)   TempSrc: Oral  Oral   Resp: 29 24 24    Height:      Weight:    110.7 kg (244 lb 0.8 oz)  SpO2: 99% 99% 100%     Intake/Output Summary (Last 24 hours) at 07/10/15 0750 Last data filed at 07/10/15 0600  Gross per 24 hour  Intake    879 ml  Output   1000 ml  Net   -121 ml   Filed Weights   07/09/15 1716 07/09/15 2047 07/10/15 0500  Weight: 108.7 kg (239 lb 10.2 oz) 107.8 kg (237 lb 10.5 oz) 110.7 kg (244 lb 0.8 oz)    Examination:  General:   A/O 4, Laying in bed comfortably.No acute  respiratory distress Eyes: negative scleral hemorrhage, negative anisocoria, negative icterus ENT: Negative Runny nose, negative gingival bleeding, CorTrak tube present Neck:  Negative scars, masses, torticollis, lymphadenopathy, JVD Lungs: Clear to auscultation bilaterally without wheezes or crackles. Right chest wall HD cath present, negative sign of infection Cardiovascular: Regular rate and rhythm without murmur gallop or rub normal S1 and S2 Abdomen: negative abdominal pain, nondistended, positive soft, bowel sounds, no rebound, no ascites, no appreciable mass Extremities: No significant cyanosis, clubbing, or edema bilateral lower extremities Skin: Negative rashes, lesions, ulcers Psychiatric:  Negative depression, negative anxiety, negative fatigue, negative mania  Central nervous system:  Cranial nerves II through XII intact, tongue/uvula midline, all extremities muscle strength 5/5, sensation intact throughout,  negative dysarthria, negative expressive aphasia, negative receptive aphasia. .     Data Reviewed: Care  during the described time interval was provided by me .  I have reviewed this patient's available data, including medical history, events of note, physical examination, and all test results as part of my evaluation. I have personally reviewed and interpreted all radiology studies.  CBC:  Recent Labs Lab 07/04/15 0355 07/05/15 0425 07/06/15 0445 07/07/15 0525 07/08/15 0746 07/09/15 0941  WBC 8.6 8.2 9.0 7.7 11.2* 11.4*  NEUTROABS 6.3  --  7.4 6.4 9.5*  --   HGB 7.9* 7.9* 8.2* 7.6* 8.5* 8.6*  HCT 27.8* 26.8* 27.6* 25.2* 29.2* 29.8*  MCV 96.5 93.7 93.9 93.7 97.3 96.4  PLT 228 253 258 264 270 [id number]   Basic Metabolic Panel:  Recent Labs Lab 07/04/15 0355 07/05/15 0425 07/06/15 0445 07/07/15 0525 07/08/15 0522 07/08/15 0746 07/09/15 0400 07/09/15 0941 07/10/15 0529  NA 137 136 137 136 135  --  133* 134* 132*  K 4.4 4.5 3.8 3.7 4.0  --  4.6 4.4 3.7  CL 102 102 99* 98* 99*  --  98* 99* 95*  CO2 26 23 27 26 27   --  25 25 26   GLUCOSE 177* 201* 173* 113* 113*  --  136* 116* 99  BUN 48* 101* 42* 69* 27*  --  46* 48* 20  CREATININE 2.11* 3.71* 2.68* 4.27* 2.75*  --  4.00* 4.24* 2.60*  CALCIUM 8.5* 8.6* 8.2* 8.4* 8.2*  --  8.3* 8.4* 8.2*  MG 2.5* 2.5*  --   --   --  2.1  --  2.1  --   PHOS 3.3 4.0 4.0 5.2* 2.7  --  3.5  --  2.5   GFR: Estimated Creatinine Clearance: 31.5 mL/min (by C-G formula based on Cr of 2.6). Liver Function Tests:  Recent Labs Lab 07/05/15 0425 07/06/15 0445 07/07/15 0525 07/08/15 0522 07/09/15 0400 07/10/15 0529  AST 41  --   --   --   --   --   ALT 31  --   --   --   --   --   ALKPHOS 128*  --   --   --   --   --   BILITOT 1.2  --   --   --   --   --   PROT 6.2*  --   --   --   --   --   ALBUMIN 2.0* 2.1* 2.1* 2.1* 2.0* 2.2*   No results for input(s): LIPASE, AMYLASE in the last 168 hours. No results for input(s): AMMONIA in the last 168 hours. Coagulation Profile:  Recent Labs Lab 07/06/15 1318 07/07/15 0525 07/08/15 0523 07/09/15 0400  07/10/15 0529  INR 1.31 1.41 1.35  1.37 1.58*   Cardiac Enzymes: No results for input(s): CKTOTAL, CKMB, CKMBINDEX, TROPONINI in the last 168 hours. BNP (last 3 results) No results for input(s): PROBNP in the last 8760 hours. HbA1C: No results for input(s): HGBA1C in the last 72 hours. CBG:  Recent Labs Lab 07/09/15 1157 07/09/15 1817 07/09/15 2147 07/09/15 2344 07/10/15 0432  GLUCAP 136* 75 136* 138* 115*   Lipid Profile: No results for input(s): CHOL, HDL, LDLCALC, TRIG, CHOLHDL, LDLDIRECT in the last 72 hours. Thyroid Function Tests: No results for input(s): TSH, T4TOTAL, FREET4, T3FREE, THYROIDAB in the last 72 hours. Anemia Panel: No results for input(s): VITAMINB12, FOLATE, FERRITIN, TIBC, IRON, RETICCTPCT in the last 72 hours. Urine analysis:    Component Value Date/Time   COLORURINE RED* 05/31/2015 1158   APPEARANCEUR TURBID* 05/31/2015 1158   LABSPEC 1.021 05/31/2015 1158   PHURINE 7.5 05/31/2015 1158   GLUCOSEU NEGATIVE 05/31/2015 1158   HGBUR LARGE* 05/31/2015 1158   BILIRUBINUR LARGE* 05/31/2015 1158   KETONESUR 40* 05/31/2015 1158   PROTEINUR >300* 05/31/2015 1158   UROBILINOGEN 0.2 03/06/2013 1748   NITRITE POSITIVE* 05/31/2015 1158   LEUKOCYTESUR LARGE* 05/31/2015 1158   Sepsis Labs: @LABRCNTIP (procalcitonin:4,lacticidven:4)  ) Recent Results (from the past 240 hour(s))  Culture, blood (routine x 2)     Status: None   Collection Time: 07/02/15 12:02 PM  Result Value Ref Range Status   Specimen Description BLOOD RIGHT ANTECUBITAL  Final   Special Requests BOTTLES DRAWN AEROBIC ONLY 5CC  Final   Culture NO GROWTH 5 DAYS  Final   Report Status 07/07/2015 FINAL  Final  Culture, blood (routine x 2)     Status: None   Collection Time: 07/02/15 12:09 PM  Result Value Ref Range Status   Specimen Description BLOOD RIGHT HAND  Final   Special Requests IN PEDIATRIC BOTTLE 1CC  Final   Culture NO GROWTH 5 DAYS  Final   Report Status 07/07/2015 FINAL   Final  Culture, respiratory (NON-Expectorated)     Status: None   Collection Time: 07/02/15  1:40 PM  Result Value Ref Range Status   Specimen Description TRACHEAL ASPIRATE  Final   Special Requests Normal  Final   Gram Stain   Final    ABUNDANT WBC PRESENT, PREDOMINANTLY PMN NO SQUAMOUS EPITHELIAL CELLS SEEN MODERATE GRAM POSITIVE COCCI IN PAIRS IN CHAINS FEW GRAM POSITIVE RODS RARE GRAM NEGATIVE RODS    Culture Consistent with normal respiratory flora.  Final   Report Status 07/07/2015 FINAL  Final  C difficile quick scan w PCR reflex     Status: None   Collection Time: 07/07/15  8:41 PM  Result Value Ref Range Status   C Diff antigen NEGATIVE NEGATIVE Final   C Diff toxin NEGATIVE NEGATIVE Final   C Diff interpretation Negative for toxigenic C. difficile  Final         Radiology Studies: No results found.      Scheduled Meds: . antiseptic oral rinse  7 mL Mouth Rinse BID  . calcitRIOL  0.25 mcg Per Tube Q48H  . darbepoetin (ARANESP) injection - DIALYSIS  200 mcg Intravenous Q Tue-HD  . heparin  40 Units/kg Dialysis Once in dialysis  . hydrocortisone sodium succinate  50 mg Intravenous Q6H  . insulin aspart  0-15 Units Subcutaneous Q4H  . insulin NPH Human  13 Units Subcutaneous BID AC & HS  . midodrine  10 mg Oral TID AC  . sodium chloride flush  10-40 mL Intracatheter Q12H  .  sucroferric oxyhydroxide  500 mg Oral TID WC  . Warfarin - Pharmacist Dosing Inpatient   Does not apply q1800   Continuous Infusions: . sodium chloride 10 mL/hr at 07/09/15 0600  . heparin 1,450 Units/hr (07/10/15 0200)     LOS: 40 days    Time spent: 40 minutes    WOODS, Geraldo Docker, MD [facility] Pager [phone]   If 7PM-7AM, please contact night-coverage [email or web address] Password [email or web address] 07/10/2015, 7:50 AM

## 2015-07-10 NOTE — Progress Notes (Addendum)
ANTICOAGULATION CONSULT NOTE - Follow-up Consult  Pharmacy Consult for heparin and warfarin Indication: DVT  No Known Allergies  Patient Measurements: Height: 5\' 10"  (177.8 cm) Weight: 244 lb 0.8 oz (110.7 kg) IBW/kg (Calculated) : 73 Heparin Dosing Weight: 97.5 kg  Vital Signs: Temp: 98.6 F (37 C) (06/04 0708) Temp Source: Oral (06/04 0708) BP: 131/99 mmHg (06/04 0708) Pulse Rate: 106 (06/04 0708)  Labs:  Recent Labs  07/08/15 0523 07/08/15 0746 07/09/15 0400 07/09/15 0941 07/09/15 1448 07/10/15 0529  HGB  --  8.5*  --  8.6*  --   --   HCT  --  29.2*  --  29.8*  --   --   PLT  --  270  --  289  --   --   LABPROT 16.8*  --  17.0*  --   --  18.9*  INR 1.35  --  1.37  --   --  1.58*  HEPARINUNFRC 0.43  --  <0.10*  --  0.55 0.55  CREATININE  --   --  4.00* 4.24*  --  2.60*    Estimated Creatinine Clearance: 31.5 mL/min (by C-G formula based on Cr of 2.6).  Assessment: 75 y.o. male admitted on 05/31/2015 with sepsis. Transferred to ICU s/p cardiac arrest with hemorrhagic and septic shock on 5/3  PMH: prostate ca s/p radiation 2011, renal cell cancer s/p left nephrectomy, afib/hx of dvt on warfarin (home dose 6 mg MWF and 5 mg all other days)  Currently on IV heparin for acute DVT RLE with a known history of DVTs and atrial fibrillation and resuming warfarin. Heparin level is therapeutic at 0.55 on 1450 units/hr. INR subtherapeutic at 1.6 but jumped after boost last pm - will repeat.  Day 5 heparin bridge. No more bleeding noted than recent hematuria.  Hemoglobin and platelet count are stable. PO intake continues to be low.  Goal of Therapy:  Heparin level 0.3-0.7 units/mL  INR 2-3 Monitor platelets by anticoagulation protocol: Yes   Plan:  Continue heparin infusion at 1450 units/hr Daily heparin level, CBC Warfarin 7.5 mg po x1 today.  Daily PT/INR - monitor closely with low po intake Monitor s/sx of bleeding   Bonnita Nasuti Pharm.D. CPP, BCPS Clinical  Pharmacist [phone] 07/10/2015 11:36 AM

## 2015-07-10 NOTE — Progress Notes (Signed)
Urinary catheter placed. Peri care provided prior to insertion. Patient tolerated procedure well. Dark bloody urine returned. Patient felt relief from abd pressure. Stat lock placed.

## 2015-07-11 ENCOUNTER — Encounter (HOSPITAL_COMMUNITY): Payer: Self-pay | Admitting: Orthopedic Surgery

## 2015-07-11 LAB — GLUCOSE, CAPILLARY
GLUCOSE-CAPILLARY: 136 mg/dL — AB (ref 65–99)
GLUCOSE-CAPILLARY: 145 mg/dL — AB (ref 65–99)
GLUCOSE-CAPILLARY: 214 mg/dL — AB (ref 65–99)
Glucose-Capillary: 130 mg/dL — ABNORMAL HIGH (ref 65–99)
Glucose-Capillary: 121 mg/dL — ABNORMAL HIGH (ref 65–99)
Glucose-Capillary: 136 mg/dL — ABNORMAL HIGH (ref 65–99)
Glucose-Capillary: 137 mg/dL — ABNORMAL HIGH (ref 65–99)

## 2015-07-11 LAB — CBC WITH DIFFERENTIAL/PLATELET
BASOS PCT: 0 %
Basophils Absolute: 0 10*3/uL (ref 0.0–0.1)
EOS ABS: 0 10*3/uL (ref 0.0–0.7)
EOS PCT: 0 %
HEMATOCRIT: 29 % — AB (ref 39.0–52.0)
Hemoglobin: 8.6 g/dL — ABNORMAL LOW (ref 13.0–17.0)
Lymphocytes Relative: 9 %
Lymphs Abs: 1.4 10*3/uL (ref 0.7–4.0)
MCH: 28.4 pg (ref 26.0–34.0)
MCHC: 29.7 g/dL — ABNORMAL LOW (ref 30.0–36.0)
MCV: 95.7 fL (ref 78.0–100.0)
MONO ABS: 0.6 10*3/uL (ref 0.1–1.0)
Monocytes Relative: 4 %
NEUTROS ABS: 13.1 10*3/uL — AB (ref 1.7–7.7)
Neutrophils Relative %: 87 %
PLATELETS: 258 10*3/uL (ref 150–400)
RBC: 3.03 MIL/uL — ABNORMAL LOW (ref 4.22–5.81)
RDW: 21.3 % — AB (ref 11.5–15.5)
WBC: 15.1 10*3/uL — ABNORMAL HIGH (ref 4.0–10.5)

## 2015-07-11 LAB — COMPREHENSIVE METABOLIC PANEL
ALT: 62 U/L (ref 17–63)
AST: 42 U/L — ABNORMAL HIGH (ref 15–41)
Albumin: 2.1 g/dL — ABNORMAL LOW (ref 3.5–5.0)
Alkaline Phosphatase: 96 U/L (ref 38–126)
Anion gap: 11 (ref 5–15)
BILIRUBIN TOTAL: 0.9 mg/dL (ref 0.3–1.2)
BUN: 36 mg/dL — AB (ref 6–20)
CHLORIDE: 98 mmol/L — AB (ref 101–111)
CO2: 25 mmol/L (ref 22–32)
CREATININE: 3.92 mg/dL — AB (ref 0.61–1.24)
Calcium: 8.1 mg/dL — ABNORMAL LOW (ref 8.9–10.3)
GFR, EST AFRICAN AMERICAN: 16 mL/min — AB (ref >60)
GFR, EST NON AFRICAN AMERICAN: 14 mL/min — AB (ref >60)
Glucose, Bld: 110 mg/dL — ABNORMAL HIGH (ref 65–99)
Potassium: 4.1 mmol/L (ref 3.5–5.1)
Sodium: 134 mmol/L — ABNORMAL LOW (ref 135–145)
TOTAL PROTEIN: 5.3 g/dL — AB (ref 6.5–8.1)

## 2015-07-11 LAB — MRSA PCR SCREENING: MRSA BY PCR: NEGATIVE

## 2015-07-11 LAB — MAGNESIUM: MAGNESIUM: 1.9 mg/dL (ref 1.7–2.4)

## 2015-07-11 LAB — HEPARIN LEVEL (UNFRACTIONATED): Heparin Unfractionated: 0.63 IU/mL (ref 0.30–0.70)

## 2015-07-11 LAB — PROTIME-INR
INR: 1.73 — ABNORMAL HIGH (ref 0.00–1.49)
Prothrombin Time: 20.2 seconds — ABNORMAL HIGH (ref 11.6–15.2)

## 2015-07-11 LAB — LACTIC ACID, PLASMA: LACTIC ACID, VENOUS: 2.4 mmol/L — AB (ref 0.5–2.0)

## 2015-07-11 MED ORDER — WARFARIN SODIUM 5 MG PO TABS
7.5000 mg | ORAL_TABLET | Freq: Once | ORAL | Status: AC
  Administered 2015-07-11: 7.5 mg via ORAL
  Filled 2015-07-11: qty 2

## 2015-07-11 MED ORDER — RENA-VITE PO TABS
1.0000 | ORAL_TABLET | Freq: Every day | ORAL | Status: DC
  Administered 2015-07-11 – 2015-07-30 (×20): 1 via ORAL
  Filled 2015-07-11 (×21): qty 1

## 2015-07-11 MED ORDER — INSULIN ASPART 100 UNIT/ML ~~LOC~~ SOLN
0.0000 [IU] | Freq: Three times a day (TID) | SUBCUTANEOUS | Status: DC
  Administered 2015-07-11: 2 [IU] via SUBCUTANEOUS
  Administered 2015-07-13: 3 [IU] via SUBCUTANEOUS
  Administered 2015-07-13: 2 [IU] via SUBCUTANEOUS
  Administered 2015-07-15: 5 [IU] via SUBCUTANEOUS
  Administered 2015-07-15: 3 [IU] via SUBCUTANEOUS
  Administered 2015-07-24 – 2015-07-26 (×2): 2 [IU] via SUBCUTANEOUS
  Administered 2015-07-28: 3 [IU] via SUBCUTANEOUS
  Administered 2015-07-29: 2 [IU] via SUBCUTANEOUS

## 2015-07-11 MED ORDER — INSULIN ASPART 100 UNIT/ML ~~LOC~~ SOLN: 0.0000 [IU] | Freq: Every day | SUBCUTANEOUS | Status: DC

## 2015-07-11 MED ORDER — PREDNISONE 10 MG PO TABS
10.0000 mg | ORAL_TABLET | Freq: Two times a day (BID) | ORAL | Status: DC
  Administered 2015-07-11 – 2015-07-13 (×5): 10 mg via ORAL
  Filled 2015-07-11 (×5): qty 1

## 2015-07-11 NOTE — Clinical Social Work Note (Signed)
Clinical Social Work Assessment  Patient Details  Name: [name] MRN: [id number] Date of Birth: 03-27-1940  Date of referral:  06/23/15               Reason for consult:  Facility Placement, Discharge Planning                Permission sought to share information with:  Facility Sport and exercise psychologist, Family Supports Permission granted to share information::  Yes, Verbal Permission Granted  Name::     Health and safety inspector::  SNF's  Relationship::  Wife  Contact Information:  [phone]  Housing/Transportation Living arrangements for the past 2 months:  [place] of Information:  Patient, Medical Team Patient Interpreter Needed:  None Criminal Activity/Legal Involvement Pertinent to Current Situation/Hospitalization:  No - Comment as needed Significant Relationships:  Spouse Lives with:  Spouse Do you feel safe going back to the place where you live?  Yes Need for family participation in patient care:  Yes (Comment)  Care giving concerns:  PT recommending CIR/LTAC. CSW will facilitate SNF backup.   Social Worker assessment / plan:  CSW met with patient. No supports at bedside. His wife had just left. CSW introduced role and explained that discharge planning would be discussed. Discussed recommendation for CIR and process of SNF backup. Patient agreeable. He would like CSW to speak with his wife. CSW attempted calling and left a voicemail. Will check the room around 4:00 today to see if she is back. No further concerns. CSW encouraged patient to contact CSW as needed. CSW will continue to follow patient for support and facilitate discharge to SNF once medically stable, if needed.  Employment status:  [employer] information:  Other (Comment Required) ([employer]) PT Recommendations:  Inpatient Rehab Consult, LTAC Information / Referral to community resources:  [place]  Patient/Family's Response to care:  Patient agreeable to SNF  backup. Patient's wife supportive and involved in patient's treatment. Patient polite and appreciated social work intervention.  Patient/Family's Understanding of and Emotional Response to Diagnosis, Current Treatment, and Prognosis:  Patient knowledgeable of medical interventions and aware of possibility for discharge to SNF once medically stable.  Emotional Assessment Appearance:  Appears stated age Attitude/Demeanor/Rapport:   (Pleasant) Affect (typically observed):  Accepting, Appropriate, Calm, Pleasant Orientation:  Oriented to Self, Oriented to Place, Oriented to  Time, Oriented to Situation Alcohol / Substance use:  Never Used Psych involvement (Current and /or in the community):  No (Comment)  Discharge Needs  Concerns to be addressed:  Care Coordination Readmission within the last 30 days:  No Current discharge risk:  Dependent with Mobility Barriers to Discharge:  [place], LCSW 07/11/2015, 3:03 PM

## 2015-07-11 NOTE — Progress Notes (Signed)
S: Eating some, Still weak O:BP 105/74 mmHg  Pulse 72  Temp(Src) 97.4 F (36.3 C) (Axillary)  Resp 20  Ht 5\' 10"  (1.778 m)  Wt 108.2 kg (238 lb 8.6 oz)  BMI 34.23 kg/m2  SpO2 100%  Intake/Output Summary (Last 24 hours) at 07/11/15 0745 Last data filed at 07/11/15 0600  Gross per 24 hour  Intake 1738.5 ml  Output    905 ml  Net  833.5 ml   Weight change: -1.4 kg (-3 lb 1.4 oz) [id number] and alert CVS: RRR Resp: Clear Abd:+ BS NTND Ext: No edema NEURO: CNI Ox3 no asterixis Rt IJ Permcath   . antiseptic oral rinse  7 mL Mouth Rinse BID  . calcitRIOL  0.25 mcg Per Tube Q48H  . darbepoetin (ARANESP) injection - DIALYSIS  200 mcg Intravenous Q Tue-HD  . heparin  40 Units/kg Dialysis Once in dialysis  . hydrocortisone sodium succinate  50 mg Intravenous Q6H  . insulin aspart  0-15 Units Subcutaneous Q4H  . insulin NPH Human  13 Units Subcutaneous BID AC & HS  . midodrine  10 mg Oral TID AC  . sodium chloride flush  10-40 mL Intracatheter Q12H  . sucroferric oxyhydroxide  500 mg Oral TID WC  . tamsulosin  0.4 mg Oral Daily  . Warfarin - Pharmacist Dosing Inpatient   Does not apply q1800   No results found. BMET    Component Value Date/Time   NA 134* 07/11/2015 0411   K 4.1 07/11/2015 0411   CL 98* 07/11/2015 0411   CO2 25 07/11/2015 0411   GLUCOSE 110* 07/11/2015 0411   BUN 36* 07/11/2015 0411   CREATININE 3.92* 07/11/2015 0411   CALCIUM 8.1* 07/11/2015 0411   GFRNONAA 14* 07/11/2015 0411   GFRAA 16* 07/11/2015 0411   CBC    Component Value Date/Time   WBC 15.1* 07/11/2015 0411   RBC 3.03* 07/11/2015 0411   RBC 2.67* 04/04/2013 0013   HGB 8.6* 07/11/2015 0411   HCT 29.0* 07/11/2015 0411   PLT 258 07/11/2015 0411   MCV 95.7 07/11/2015 0411   MCH 28.4 07/11/2015 0411   MCHC 29.7* 07/11/2015 0411   RDW 21.3* 07/11/2015 0411   LYMPHSABS PENDING 07/11/2015 0411   MONOABS PENDING 07/11/2015 0411   EOSABS PENDING 07/11/2015 0411   BASOSABS PENDING 07/11/2015  0411     Assessment: 1.  Acute on CKD 3 2. Klebsiella bacteremia 3.  Sec HPTH on rocaltrol 4. Anemia on aranesp 5. Hypotension, on midodrine  Plan: 1.  Difficult to know how much, if any, urine he is making with the hematuria but Scr higher today.  Anticipate HD tomorrow if Scr higher.  ? Is whether renal fx will recover 2. Start renavite 3. DC velphoro 4. PTH was only 65, not sure he needs rocaltrol, will cont for now 5. Recheck labs in AM  Sabina Beavers T

## 2015-07-11 NOTE — Progress Notes (Signed)
Speech Language Pathology Treatment: Dysphagia  Patient Details Name: [name] MRN: [id number] DOB: 1940-03-18 Today's Date: 07/11/2015 Time: [id number] SLP Time Calculation (min) (ACUTE ONLY): 25 min  Assessment / Plan / Recommendation Clinical Impression  Pt consumed Dysphagia 2/nectar-thickened liquids without overt s/s of aspiration noted; delayed cough noted with ice chips/teaspoon of thin suggesting improved sensory awareness (previous MBS stated silent aspiration with thins) and with magic cup after successive swallows, but with required dry swallows between bites, this was eliminated d/t probable pharyngeal residue with this consistency; pt wants to remain on Dysphagia 2 diet for now as he requested during tx session; minimal verbal cues required for dry swallows and small sips during tx session; recommend Dysphagia 2/nectar-thickened liquids continue d/t risk for aspiration being moderate with thin liquids and aphonic/dysphonic vocal quality noted during session; repeat MBS may be indicated prior to upgrade of liquids d/t hx of silent aspiration   HPI HPI: 75 yo male with hemorrhagic shock leading to cardiac arrest, VDRF, AKI from Rt perinephric hematoma. He was intubated 5/03-5/13, 5/16-5/27. He has hx of Lt renal cell carcinoma s/p Lt nephrectomy, prostate cancer s/p XRT, PE in 2008, OSA, HTN, HLD, DM, A fib.      SLP Plan  Goals updated; possible repeat MBS     Recommendations  Diet recommendations: Dysphagia 2 (fine chop);Nectar-thick liquid Liquids provided via: Cup Medication Administration: Crushed with puree Supervision: Staff to assist with self feeding Compensations: Minimize environmental distractions;Slow rate;Small sips/bites;Multiple dry swallows after each bite/sip Postural Changes and/or Swallow Maneuvers: Seated upright 90 degrees             Oral Care Recommendations: Oral care BID Plan: Continue with current plan of care                      Dannah Ryles,PAT, M.S.,, CCC-SLP 07/11/2015, 11:42 AM

## 2015-07-11 NOTE — Progress Notes (Signed)
Placed patient on CPAP set at 10cm for the night  

## 2015-07-11 NOTE — Progress Notes (Signed)
[place] TEAM 1 - Stepdown/ICU TEAM  ALGIE BIBB  [id number] DOB: Aug 25, 1940 DOA: 05/31/2015 PCP: Melinda Crutch    Brief Narrative:  75 y.o. M w/ Hx Pulmonary emboli 2008, DVT, Afib, HTN, Dysrhythmia, HLD, DM2, Prostate CA s/p radiation 2011, and RCC s/p Lt Nephrectomy who was seen in ED 4/22 for clogged catheter which was irrigated and replaced. He apparently has had hematuria for quite some time which has been attributed to scarring from prior radiation.  On 04/25 he went to have his INR checked and it was 8.  He was hypotensive with SBP 70/44. He sent to the ED.  In the ED he was found to have a possible UTI and despite 4.5L IVF he remained hypotensive. He also had multiple metabolic derangements including Na 124, K 5.0, AGMA, lactate 3, SCr 9.36. PCCM completed admission for septic shock due to urosepsis as well as AKI.  Significant Events: 4/25 admitted by [facility] w/ septic shock due to UTI + ARF  4/26 Renal u/s > s/p Lt nephrectomy 4/27 CT abd/pelvis > Rt perinephric hematoma 4/30 [facility] assumed care  5/02 CT abd/pelvis > increased size of hematoma 5/03 Cardiac arrest w/ severe anemia > coil embolization R kidney by IR; PTX post-CPR 5/08 TTE EF 65-70% 5/16 Reintubated due to airway secretions 5/18 Doppler legs b/l > Acute DVT Rt gastrocnemius vein, age indeterminate DVT Rt femoral vein and popliteal vein > heparin gtt 5/23 TTE Moderate LVH - EF 65-70%. No wall motion abnormalities. LA mildly dilated. No AS or AR.  5/25 LLE Arterial Duplex: No evidence of stenosis or occluded arteries. 5/27- extubated 5/28 off cvvhd 6/4 CorTrak tube removed  Assessment & Plan:  Hemorrhagic Shock / Acute Blood loss Anemia  -secondary to Rt Perinephric Hematoma - hemodynamically stable at this time   Cardiac Arrest 5/3 -felt to be due to severe anemia   Pneumothorax after CPR 5/03 -chest tubes removed 5/14 - no resp distress   Right Gastrocnemius vein Acute DVT / DVT Right femoral &  popliteal veins -IV heparin - monitor hemoglobin closely secondary to patient's episode of hemorrhagic shock -Hemoglobin stable - discussed risk of anticoag in this setting but need for it as well w/ pt and wife   Chronic diastolic CHF -no signif overload at this time [facility] Weights   07/09/15 2047 07/10/15 0500 07/11/15 0420  Weight: 107.8 kg (237 lb 10.5 oz) 110.7 kg (244 lb 0.8 oz) 108.2 kg (238 lb 8.6 oz)    Ischemic Changes Left Foot  -Arterial duplex w/o notable findings  Acute on Chronic Renal Failure now HD per Nephrology - will need eventual permanent access   H/O Left Nephrectomy for RCC  Hx prostate CA s/p radiation 2011  Chronic Hematuria -Flush Foley catheter q 2hr and PRN  Dysphagia -Passed swallow evaluation > dysphagia 2 diet nectar thick liquid - good intake presently   Septic Shock due to Klebsiella Pyelo and Bacteremia  -completed 14 day antibiotic course 5/10  DM2  -5/2 A1C 7.8 - CBG controlled   Acute Encephalopathy  -due to above - resolved   Chronic A.fib Rate controlled - on anticoag as above   Severe OSA on CPAP confirmed on sleep study 2009  Obesity - Body mass index is 41.22 kg/(m^2).   DVT prophylaxis: IV heparin > warfarin  Code Status: FULL CODE Family Communication: spoke w/ wife at bedside   Disposition Plan: SDU - ?CIR   Consultants:  Nephrology PCCM [facility]  PCCM  Antimicrobials:  4/28 Rocephin > 5/10  Subjective: Pt is alert and interactive.  He denies cp, sob, n/v, or abdom pain.  Hematuria persists.    Objective: Blood pressure 99/73, pulse 75, temperature 97.6 F (36.4 C), temperature source Oral, resp. rate 20, height 5\' 10"  (1.778 m), weight 108.2 kg (238 lb 8.6 oz), SpO2 100 %.  Intake/Output Summary (Last 24 hours) at 07/11/15 1122 Last data filed at 07/11/15 1000  Gross per 24 hour  Intake 1448.5 ml  Output    725 ml  Net  723.5 ml   Filed Weights   07/09/15 2047 07/10/15  0500 07/11/15 0420  Weight: 107.8 kg (237 lb 10.5 oz) 110.7 kg (244 lb 0.8 oz) 108.2 kg (238 lb 8.6 oz)    Examination: General: No acute respiratory distress Lungs: Clear to auscultation bilaterally without wheezes or crackles Cardiovascular: Regular rate without murmur gallop or rub normal S1 and S2 Abdomen: Nontender, overweight, soft, bowel sounds positive, no rebound, no ascites, no appreciable mass Extremities: No significant cyanosis, or clubbing - trace edema bilateral lower extremities  CBC:  Recent Labs Lab 07/06/15 0445 07/07/15 0525 07/08/15 0746 07/09/15 0941 07/10/15 1443 07/11/15 0411  WBC 9.0 7.7 11.2* 11.4* 15.7* 15.1*  NEUTROABS 7.4 6.4 9.5*  --   --  13.1*  HGB 8.2* 7.6* 8.5* 8.6* 8.7* 8.6*  HCT 27.6* 25.2* 29.2* 29.8* 29.5* 29.0*  MCV 93.9 93.7 97.3 96.4 96.1 95.7  PLT 258 264 270 289 241 0000000   Basic Metabolic Panel:  Recent Labs Lab 07/05/15 0425 07/06/15 0445 07/07/15 0525 07/08/15 0522 07/08/15 0746 07/09/15 0400 07/09/15 0941 07/10/15 0529 07/11/15 0411  NA 136 137 136 135  --  133* 134* 132* 134*  K 4.5 3.8 3.7 4.0  --  4.6 4.4 3.7 4.1  CL 102 99* 98* 99*  --  98* 99* 95* 98*  CO2 23 27 26 27   --  25 25 26 25   GLUCOSE 201* 173* 113* 113*  --  136* 116* 99 110*  BUN 101* 42* 69* 27*  --  46* 48* 20 36*  CREATININE 3.71* 2.68* 4.27* 2.75*  --  4.00* 4.24* 2.60* 3.92*  CALCIUM 8.6* 8.2* 8.4* 8.2*  --  8.3* 8.4* 8.2* 8.1*  MG 2.5*  --   --   --  2.1  --  2.1  --  1.9  PHOS 4.0 4.0 5.2* 2.7  --  3.5  --  2.5  --    GFR: Estimated Creatinine Clearance: 20.7 mL/min (by C-G formula based on Cr of 3.92).  Liver Function Tests:  Recent Labs Lab 07/05/15 0425  07/07/15 0525 07/08/15 0522 07/09/15 0400 07/10/15 0529 07/11/15 0411  AST 41  --   --   --   --   --  42*  ALT 31  --   --   --   --   --  62  ALKPHOS 128*  --   --   --   --   --  96  BILITOT 1.2  --   --   --   --   --  0.9  PROT 6.2*  --   --   --   --   --  5.3*  ALBUMIN  2.0*  < > 2.1* 2.1* 2.0* 2.2* 2.1*  < > = values in this interval not displayed.  Coagulation Profile:  Recent Labs Lab 07/07/15 0525 07/08/15 0523 07/09/15 0400 07/10/15 0529 07/11/15 0411  INR 1.41 1.35 1.37 1.58* 1.73*  HbA1C: HGB A1C MFR BLD  Date/Time Value Ref Range Status  06/07/2015 05:15 PM 7.8* 4.8 - 5.6 % Final    Comment:    (NOTE)         Pre-diabetes: 5.7 - 6.4         Diabetes: >6.4         Glycemic control for adults with diabetes: <7.0   04/29/2015 03:45 PM 8.6* 4.8 - 5.6 % Final    Comment:    (NOTE)         Pre-diabetes: 5.7 - 6.4         Diabetes: >6.4         Glycemic control for adults with diabetes: <7.0     CBG:  Recent Labs Lab 07/10/15 1524 07/10/15 2008 07/11/15 0028 07/11/15 0416 07/11/15 0827  GLUCAP 162* 197* 145* 130* 121*    Recent Results (from the past 240 hour(s))  Culture, blood (routine x 2)     Status: None   Collection Time: 07/02/15 12:02 PM  Result Value Ref Range Status   Specimen Description BLOOD RIGHT ANTECUBITAL  Final   Special Requests BOTTLES DRAWN AEROBIC ONLY 5CC  Final   Culture NO GROWTH 5 DAYS  Final   Report Status 07/07/2015 FINAL  Final  Culture, blood (routine x 2)     Status: None   Collection Time: 07/02/15 12:09 PM  Result Value Ref Range Status   Specimen Description BLOOD RIGHT HAND  Final   Special Requests IN PEDIATRIC BOTTLE 1CC  Final   Culture NO GROWTH 5 DAYS  Final   Report Status 07/07/2015 FINAL  Final  Culture, respiratory (NON-Expectorated)     Status: None   Collection Time: 07/02/15  1:40 PM  Result Value Ref Range Status   Specimen Description TRACHEAL ASPIRATE  Final   Special Requests Normal  Final   Gram Stain   Final    ABUNDANT WBC PRESENT, PREDOMINANTLY PMN NO SQUAMOUS EPITHELIAL CELLS SEEN MODERATE GRAM POSITIVE COCCI IN PAIRS IN CHAINS FEW GRAM POSITIVE RODS RARE GRAM NEGATIVE RODS    Culture Consistent with normal respiratory flora.  Final   Report Status  07/07/2015 FINAL  Final  C difficile quick scan w PCR reflex     Status: None   Collection Time: 07/07/15  8:41 PM  Result Value Ref Range Status   C Diff antigen NEGATIVE NEGATIVE Final   C Diff toxin NEGATIVE NEGATIVE Final   C Diff interpretation Negative for toxigenic C. difficile  Final     Scheduled Meds: . antiseptic oral rinse  7 mL Mouth Rinse BID  . calcitRIOL  0.25 mcg Per Tube Q48H  . darbepoetin (ARANESP) injection - DIALYSIS  200 mcg Intravenous Q Tue-HD  . heparin  40 Units/kg Dialysis Once in dialysis  . hydrocortisone sodium succinate  50 mg Intravenous Q6H  . insulin aspart  0-15 Units Subcutaneous Q4H  . insulin NPH Human  13 Units Subcutaneous BID AC & HS  . midodrine  10 mg Oral TID AC  . multivitamin  1 tablet Oral QHS  . sodium chloride flush  10-40 mL Intracatheter Q12H  . tamsulosin  0.4 mg Oral Daily  . warfarin  7.5 mg Oral ONCE-1800  . Warfarin - Pharmacist Dosing Inpatient   Does not apply q1800   Continuous Infusions: . sodium chloride 10 mL/hr at 07/11/15 0600  . heparin 1,450 Units/hr (07/11/15 0600)     LOS: 41 days   Time spent: 35 minutes  Cherene Altes, MD [facility] Office  [phone] Pager - Text Page per Amion as per below:  On-Call/Text Page:      [email or web address]      password [email or web address]  If 7PM-7AM, please contact night-coverage [email or web address] Password [email or web address] 07/11/2015, 11:22 AM

## 2015-07-11 NOTE — Progress Notes (Signed)
ANTICOAGULATION CONSULT NOTE - Follow-up Consult  Pharmacy Consult for heparin and warfarin Indication: DVT  No Known Allergies  Patient Measurements: Height: 5\' 10"  (177.8 cm) Weight: 238 lb 8.6 oz (108.2 kg) IBW/kg (Calculated) : 73 Heparin Dosing Weight: 97.5 kg  Vital Signs: Temp: 97.6 F (36.4 C) (06/05 0829) Temp Source: Oral (06/05 0829) BP: 99/73 mmHg (06/05 0829) Pulse Rate: 75 (06/05 0829)  Labs:  Recent Labs  07/09/15 0400  07/09/15 0941 07/09/15 1448 07/10/15 0529 07/10/15 1443 07/11/15 0411  HGB  --   < > 8.6*  --   --  8.7* 8.6*  HCT  --   --  29.8*  --   --  29.5* 29.0*  PLT  --   --  289  --   --  241 258  LABPROT 17.0*  --   --   --  18.9*  --  20.2*  INR 1.37  --   --   --  1.58*  --  1.73*  HEPARINUNFRC <0.10*  --   --  0.55 0.55  --  0.63  CREATININE 4.00*  --  4.24*  --  2.60*  --  3.92*  < > = values in this interval not displayed.  Estimated Creatinine Clearance: 20.7 mL/min (by C-G formula based on Cr of 3.92).  Assessment: 75 y.o. male admitted on 05/31/2015 with sepsis. Transferred to ICU s/p cardiac arrest with hemorrhagic and septic shock on 5/3  PMH: prostate ca s/p radiation 2011, renal cell cancer s/p left nephrectomy, afib/hx of dvt on warfarin. Warfarin PTA - regimen was 6mg  MWF and 5mg  all other days  Currently on IV heparin for acute DVT RLE with a known history of DVTs and atrial fibrillation and restarted warfarin 5/31.   Heparin level is therapeutic at 0.63 on 1450 units/hr. INR remains subtherapeutic at 1.73.  Day 6 heparin bridge. No more bleeding noted than recent hematuria.  Hemoglobin and platelet count are stable. PO intake continues to be low.  Goal of Therapy:  Heparin level 0.3-0.7 units/mL  INR 2-3 Monitor platelets by anticoagulation protocol: Yes   Plan:  Continue heparin infusion at 1450 units/hr Daily heparin level, CBC Warfarin 7.5 mg po x1 today.  Monitor daily INR, CBC, clinical course, s/sx of bleed, PO  intake, DDI   Thank you for allowing [place] to participate in this patients care. Jens Som, PharmD Pager: [phone]  07/11/2015 11:10 AM

## 2015-07-11 NOTE — Clinical Social Work Placement (Signed)
   CLINICAL SOCIAL WORK PLACEMENT  NOTE  Date:  07/11/2015  Patient Details  Name: [name] MRN: [id number] Date of Birth: 1940-03-03  Clinical Social Work is seeking post-discharge placement for this patient at the [place] level of care (*CSW will initial, date and re-position this form in  chart as items are completed):  Yes   Patient/family provided with [place] Work Department's list of facilities offering this level of care within the geographic area requested by the patient (or if unable, by the patient's family).  Yes   Patient/family informed of their freedom to choose among providers that offer the needed level of care, that participate in Medicare, Medicaid or managed care program needed by the patient, have an available bed and are willing to accept the patient.  Yes   Patient/family informed of 's ownership interest in [facility] and [facility], as well as of the fact that they are under no obligation to receive care at these facilities.  PASRR submitted to EDS on 07/11/15     PASRR number received on 07/11/15     Existing PASRR number confirmed on       FL2 transmitted to all facilities in geographic area requested by pt/family on 07/11/15     FL2 transmitted to all facilities within larger geographic area on       Patient informed that his/her managed care company has contracts with or will negotiate with certain facilities, including the following:            Patient/family informed of bed offers received.  Patient chooses bed at       Physician recommends and patient chooses bed at      Patient to be transferred to   on  .  Patient to be transferred to facility by       Patient family notified on   of transfer.  Name of family member notified:        PHYSICIAN       Additional Comment:    _______________________________________________ Candie Chroman, LCSW 07/11/2015, 3:10 PM

## 2015-07-11 NOTE — Progress Notes (Signed)
Physical Therapy Treatment Patient Details Name: [name] MRN: [id number] DOB: March 14, 1940 Today's Date: 07/11/2015    History of Present Illness Pt adm with septic shock likely due to UTI. Pt with renal failure and on CVVHD. Pt then with Rt perinephric hematoma that required embolization and pt with VDRF. Cardiac arrest 5/3. Reintubated 5/16. Extubated 5/27. PMH - recent (3/28) rt rotator cuff repair, Lt renal cell carcinoma s/p Lt nephrectomy, prostate cancer s/p XRT, PE in 2008, OSA, HTN, HLD, DM, A fib.    PT Comments    Patient seen for mobility progression and increased activity. Patient tolerated session well with focus on sit <> stand transfer training and LE strengthening with functional tasks. Patient still requires increased physical assist and demonstrates some anxiety with mobility tasks. Will continue to see and progress as tolerated. OF NOTE: HR elevated to mid 120s with activity, eased with rest and cues for breath control.   Follow Up Recommendations  CIR;LTACH (possibly in the future depending on progress)     Equipment Recommendations  Other (comment) (To be assessed)    Recommendations for Other Services OT consult     Precautions / Restrictions Precautions Precautions: Fall Type of Shoulder Precautions: RTC repiar 04/2015 Precaution Comments: Per Dr Titus Mould who called Dr. Gladstone Lighter,  Pt able to weight bear through Rt UE, but no ROM of Rt shoulder. Sent fax to [phone] 6/5 asking for specific ROM guildeline as pt is over 2 months out from repair.  Required Braces or Orthoses: Sling (per chart, although none in room) Restrictions Weight Bearing Restrictions: No    Mobility  Bed Mobility Overal bed mobility: Needs Assistance Bed Mobility: Rolling;Sidelying to Sit Rolling: Min guard Sidelying to sit: Mod assist;HOB elevated       General bed mobility comments: pt pushing through LUE. Limited pushing through RUE   Transfers Overall transfer level: Needs  assistance Equipment used: 2 person hand held assist Transfers: Sit to/from [facility] Sit to Stand: Mod assist;+2 physical assistance Stand pivot transfers: Mod assist;+2 physical assistance;From elevated surface (with stedy)       General transfer comment: Able to complete sit - stand without use of stedy. Requried stedy to complete stand pivot transfer  Ambulation/Gait                 Stairs            Wheelchair Mobility    Modified Rankin (Stroke Patients Only)       Balance Overall balance assessment: Needs assistance Sitting-balance support: No upper extremity supported Sitting balance-Leahy Scale: Fair Sitting balance - Comments: able to tolerate static sitting at EOB without physical assist today   Standing balance support: During functional activity Standing balance-Leahy Scale: Poor                      Cognition Arousal/Alertness: Awake/alert Behavior During Therapy: WFL for tasks assessed/performed Overall Cognitive Status: Within Functional Limits for tasks assessed (appears [facility])                      Exercises General Exercises - Upper Extremity Shoulder Flexion: AAROM;Left;10 reps;Seated Shoulder ABduction: AAROM;Left;10 reps;Seated Elbow Flexion: AROM;Both;10 reps Elbow Extension: AROM;Both;10 reps General Exercises - Lower Extremity Ankle Circles/Pumps: AROM;Right;Left;10 reps;Seated Long Arc Quad: AROM;Both;10 reps;Seated Other Exercises Other Exercises: performed transfer training part task x5 with sit<> stand  Other Exercises: Pre gait static standing with assist and weight shifts to attept pivotal steps, unable to  complete pivoatl transfer this session    General Comments        Pertinent Vitals/Pain Pain Assessment: Faces Pain Score: 7  Faces Pain Scale: Hurts little more Pain Location: general discomfort Pain Descriptors / Indicators: Discomfort Pain Intervention(s): Limited activity  within patient's tolerance    Home Living                      Prior Function            PT Goals (current goals can now be found in the care plan section) Acute Rehab PT Goals Patient Stated Goal: get stronger and return home PT Goal Formulation: With patient Time For Goal Achievement: 07/18/15 Potential to Achieve Goals: Fair Progress towards PT goals: Progressing toward goals    Frequency  Min 3X/week    PT Plan Current plan remains appropriate    Co-evaluation PT/OT/SLP Co-Evaluation/Treatment: Yes Reason for Co-Treatment: Complexity of the patient's impairments (multi-system involvement);For patient/therapist safety PT goals addressed during session: Mobility/safety with mobility;Balance OT goals addressed during session: ADL's and self-care;Strengthening/ROM     End of Session Equipment Utilized During Treatment: Gait belt;Oxygen Activity Tolerance: Patient limited by fatigue Patient left: in chair;with call bell/phone within reach     Time: 1036-1102 PT Time Calculation (min) (ACUTE ONLY): 26 min  Charges:  $Therapeutic Activity: 8-22 mins                    G CodesDuncan Dull July 20, 2015, 1:50 PM Alben Deeds, [place] DPT  [phone]

## 2015-07-11 NOTE — Discharge Instructions (Signed)

## 2015-07-11 NOTE — NC FL2 (Signed)
[place] LEVEL OF CARE SCREENING TOOL     IDENTIFICATION  Patient Name: [name] Birthdate: 1940/07/18 Sex: male Admission Date (Current Location): 05/31/2015  [facility] and [place] Number:  Herbalist and Address:  [facility]. [facility], [place] [address], [place], [place] [address]      Provider Number: [id number]  Attending Physician Name and Address:  Cherene Altes, MD  Relative Name and Phone Number:       Current Level of Care: Hospital Recommended Level of Care: [place] Prior Approval Number:    Date Approved/Denied:   PASRR Number: [id number]  Discharge Plan: SNF    Current Diagnoses: Patient Active Problem List   Diagnosis Date Noted  . Traumatic pneumothorax   . OSA on CPAP   . Acute on chronic renal failure ([place])   . Hemorrhagic shock   . S/p nephrectomy   . Sepsis due to Klebsiella ([place])   . OSA (obstructive sleep apnea)   . Chronic diastolic CHF (congestive heart failure) ([place])   . Cardiac arrest ([place])   . Acute deep vein thrombosis (DVT) of distal vein of right lower extremity ([place])   . Acute renal failure (ARF) ([place])   . Acute hypoxemic respiratory failure ([place])   . Acute back pain   . Bleeding   . Chest tube in place   . Perinephric hematoma   . Right flank pain   . Acute respiratory failure ([place])   . Bacteremia due to Klebsiella pneumoniae   . Secondary cardiomyopathy ([place])   . Chronic atrial fibrillation ([place])   . Uncontrolled type 2 diabetes mellitus with complication ([place])   . Dyspnea   . Acute renal failure ([place]) 06/02/2015  . Encounter for central line placement   . Septic shock ([place]) 05/31/2015  . Renal failure   . Urinary tract infectious disease   . Arterial hypotension   . Increased anion gap metabolic acidosis   . Hyponatremia   . Rotator cuff tear 05/03/2015  . Atrial fibrillation ([place]) 11/10/2014  . Obesity (BMI 30-39.9) 11/10/2014  . Type II or unspecified type  diabetes mellitus with unspecified complication, uncontrolled 04/06/2013  . Anemia 04/04/2013  . 1St degree AV block 03/07/2013  . Acute blood loss anemia 03/06/2013  . Acute-on-chronic kidney injury, baseline CKD stage 3 03/06/2013  . Hypotension, unspecified 03/06/2013  . Bradycardia 03/06/2013  . Perineal pain 03/06/2013  . Preoperative clearance 03/03/2013  . Gross hematuria 03/02/2013  . Severe obstructive sleep apnea 12/04/2012  . Morbid obesity ([place]) 12/04/2012  . Bilateral lower extremity edema 12/04/2012  . Venous insufficiency 12/04/2012  . History of nephrectomy, unilateral 12/04/2012  . Chronic anticoagulation 12/04/2012  . History of pulmonary embolism 12/04/2012  . Essential hypertension 12/04/2012  . Cardiac murmur 12/04/2012    Orientation RESPIRATION BLADDER Height & Weight     Self, Time, Situation, Place  Normal Indwelling catheter, Continent Weight: 238 lb 8.6 oz (108.2 kg) Height:  5\' 10"  (177.8 cm)  BEHAVIORAL SYMPTOMS/MOOD NEUROLOGICAL BOWEL NUTRITION STATUS   (None)  (None) Incontinent Diet (DYS 2, Fluid Nectar Thick)  AMBULATORY STATUS COMMUNICATION OF NEEDS Skin   Extensive Assist Verbally  (MASD abdomen and groin, ecchymosis arm and foot)                       Personal Care Assistance Level of Assistance  Bathing, Feeding, Dressing Bathing Assistance: Maximum assistance Feeding assistance: Limited assistance Dressing Assistance: Maximum  assistance     Functional Limitations Info  Sight, Hearing, Speech Sight Info: Adequate Hearing Info: Adequate Speech Info: Adequate (None)    SPECIAL CARE FACTORS FREQUENCY  PT (By licensed PT), Blood pressure, Diabetic urine testing, OT (By licensed OT), Speech therapy     PT Frequency: 5 x week OT Frequency: 5 x week     Speech Therapy Frequency: 5 x week      Contractures Contractures Info: Not present    Additional Factors Info  Code Status, Allergies Code Status Info: Full Allergies  Info: NKDA           Current Medications (07/11/2015):  This is the current hospital active medication list Current Facility-Administered Medications  Medication Dose Route Frequency Provider Last Rate Last Dose  . 0.9 %  sodium chloride infusion   Intravenous Continuous Kara Mead V, MD 10 mL/hr at 07/11/15 0600    . 0.9 %  sodium chloride infusion  100 mL Intravenous PRN Mauricia Area, MD      . 0.9 %  sodium chloride infusion  100 mL Intravenous PRN Mauricia Area, MD      . acetaminophen (TYLENOL) solution 650 mg  650 mg Per Tube Q6H PRN Chesley Mires, MD   650 mg at 07/03/15 1646  . albuterol (PROVENTIL) (2.5 MG/3ML) 0.083% nebulizer solution 2.5 mg  2.5 mg Nebulization Q2H PRN Chesley Mires, MD      . alteplase (CATHFLO ACTIVASE) injection 2 mg  2 mg Intracatheter Once PRN Mauricia Area, MD      . antiseptic oral rinse (CPC / CETYLPYRIDINIUM CHLORIDE 0.05%) solution 7 mL  7 mL Mouth Rinse BID Allie Bossier, MD   7 mL at 07/11/15 1000  . calcitRIOL (ROCALTROL) 1 MCG/ML solution 0.25 mcg  0.25 mcg Per Tube Q48H [name], MD   0.25 mcg at 07/11/15 [phone]  . Darbepoetin Alfa (ARANESP) injection 200 mcg  200 mcg Intravenous Q Tue-HD Mauricia Area, MD   200 mcg at 07/05/15 1813  . fentaNYL (SUBLIMAZE) injection 25-50 mcg  25-50 mcg Intravenous Q1H PRN Javier Glazier, MD   50 mcg at 07/11/15 1138  . guaiFENesin (ROBITUSSIN) 100 MG/5ML solution 100 mg  5 mL Per Tube Q4H PRN Chesley Mires, MD   100 mg at 07/04/15 2124  . heparin ADULT infusion 100 units/mL (25000 units/250 mL)  1,450 Units/hr Intravenous Continuous Rebecka Apley, RPH 14.5 mL/hr at 07/11/15 0600 1,450 Units/hr at 07/11/15 0600  . heparin injection 1,000 Units  1,000 Units Dialysis PRN Mauricia Area, MD      . heparin injection 4,300 Units  40 Units/kg Dialysis Once in dialysis Mauricia Area, MD   4,300 Units at 07/07/15 1415  . insulin aspart (novoLOG) injection 0-15 Units  0-15 Units Subcutaneous TID WC Cherene Altes, MD      . insulin aspart (novoLOG) injection 0-5 Units  0-5 Units Subcutaneous QHS Cherene Altes, MD      . insulin NPH Human (HUMULIN N,NOVOLIN N) injection 13 Units  13 Units Subcutaneous BID AC & HS [name], MD   13 Units at 07/11/15 0845  . lidocaine (PF) (XYLOCAINE) 1 % injection 5 mL  5 mL Intradermal PRN Mauricia Area, MD      . lidocaine-prilocaine (EMLA) cream 1 application  1 application Topical PRN Mauricia Area, MD      . midodrine (PROAMATINE) tablet 10 mg  10 mg Oral TID AC Chesley Mires, MD   10 mg at  07/11/15 1138  . multivitamin (RENA-VIT) tablet 1 tablet  1 tablet Oral QHS Fleet Contras, MD      . ondansetron [facility]) injection 4 mg  4 mg Intravenous Q6H PRN Gardiner Barefoot, NP   4 mg at 06/08/15 0109  . pentafluoroprop-tetrafluoroeth (GEBAUERS) aerosol 1 application  1 application Topical PRN Mauricia Area, MD      . predniSONE (DELTASONE) tablet 10 mg  10 mg Oral BID WC Cherene Altes, MD      . RESOURCE [facility] CLE[facility]   Oral PRN [name], MD      . sodium chloride flush (NS) 0.9 % injection 10-40 mL  10-40 mL Intracatheter PRN Colbert Coyer, MD   10 mL at 07/09/15 2202  . tamsulosin (FLOMAX) capsule 0.4 mg  0.4 mg Oral Daily Allie Bossier, MD   0.4 mg at 07/11/15 0842  . warfarin (COUMADIN) tablet 7.5 mg  7.5 mg Oral ONCE-1800 Cherene Altes, MD      . Warfarin - Pharmacist Dosing Inpatient   Does not apply [place], [facility]         Discharge Medications: Please see discharge summary for a list of discharge medications.  Relevant Imaging Results:  Relevant Lab Results:   Additional Information SS#: [id number]  Candie Chroman, LCSW

## 2015-07-11 NOTE — Progress Notes (Signed)
Occupational Therapy Treatment Patient Details Name: [name] MRN: [id number] DOB: 1940/09/21 Today's Date: 07/11/2015    History of present illness Pt adm with septic shock likely due to UTI. Pt with renal failure and on CVVHD. Pt then with Rt perinephric hematoma that required embolization and pt with VDRF. Cardiac arrest 5/3. Reintubated 5/16. Extubated 5/27. PMH - recent (3/28) rt rotator cuff repair, Lt renal cell carcinoma s/p Lt nephrectomy, prostate cancer s/p XRT, PE in 2008, OSA, HTN, HLD, DM, A fib.   OT comments  Pt making excellent progress. Able to sit EOB unsupported and complete sit - stand from bed x 3 with +2 mod A at times. Requires Stedy for stand pivot as pt unable to step at this time. HR Max @ 124. Other vitals stable. Fax sent to Dr. Cleatis Polka office to progress rehab of R shoulder as pt is over 2 months out from RTC repair. Pt very motivated to participate with therapy. Encouraged pt to complete chair/bed level exercises and participate in his self care to facilitate recovery. Pt/wife verbalized understanding. Will continue to follow.  Follow Up Recommendations  Supervision/Assistance - 24 hour;SNF    Equipment Recommendations  3 in 1 bedside comode    Recommendations for Other Services      Precautions / Restrictions Precautions Precautions: Fall Type of Shoulder Precautions: RTC repiar 04/2015 Precaution Comments: Per Dr Titus Mould who called Dr. Gladstone Lighter,  Pt able to weight bear through Rt UE, but no ROM of Rt shoulder. Sent fax to [phone] 6/5 asking for specific ROM guildeline as pt is over 2 months out from repair.  Required Braces or Orthoses: Sling (per chart, although none in room) Restrictions Weight Bearing Restrictions: No       Mobility Bed Mobility Overal bed mobility: Needs Assistance Bed Mobility: Rolling;Sidelying to Sit Rolling: Min guard Sidelying to sit: Mod assist;HOB elevated       General bed mobility comments: pt pushing  through LUE. Limited pushing through RUE   Transfers Overall transfer level: Needs assistance Equipment used: 2 person hand held assist Transfers: Sit to/from [facility] Sit to Stand: Mod assist;+2 physical assistance; 1 trial only required min a +2 Stand pivot transfers: Mod assist;+2 physical assistance;From elevated surface (with stedy)       General transfer comment: Able to complete sit - stand without use of stedy. Requried stedy to complete stand pivot transfer; appears anxious about trying to step; Increased work of breathing with mobility but vitals stable.    Balance     Sitting balance-Leahy Scale: Fair       Standing balance-Leahy Scale: Poor                     ADL Overall ADL's : Needs assistance/impaired                                     Functional mobility during ADLs: +2 for physical assistance;Moderate assistance General ADL Comments: Charlaine Dalton used to progress mobility. +2 HHA mod A sit - stand x 3, unable to stand pivot/take steps at this time      Vision                     Perception     Praxis      Cognition   Behavior During Therapy: [facility] for tasks assessed/performed Overall Cognitive Status: Within Functional Limits for tasks  assessed (appears [facility])                       Extremity/Trunk Assessment   R UE - per chart pt may weight bear through RUE but no ROM. Pt is over 2 months out from repair and needs to progress rehab of R shoulder. Fax sent to Dr. Cleatis Polka office. (spoke with Hoyle Sauer) L knee buckled at times when trying to bear weight through LLE to step with RLE.             Exercises General Exercises - Upper Extremity Shoulder Flexion: AAROM;Left;10 reps;Seated Shoulder ABduction: AAROM;Left;10 reps;Seated Elbow Flexion: AROM;Both;10 reps Elbow Extension: AROM;Both;10 reps   Shoulder Instructions       General Comments  Pt/wife very appreciative    Pertinent  Vitals/ Pain       Pain Assessment: Faces Pain Score: 7  Faces Pain Scale: Hurts little more Pain Location: general discomfort Pain Descriptors / Indicators: Discomfort Pain Intervention(s): Limited activity within patient's tolerance  Home Living                                          Prior Functioning/Environment              Frequency Min 2X/week     Progress Toward Goals  OT Goals(current goals can now be found in the care plan section)  Progress towards OT goals: Progressing toward goals  Acute Rehab OT Goals Patient Stated Goal: get stronger and return home OT Goal Formulation: With patient Time For Goal Achievement: 07/18/15 Potential to Achieve Goals: Good ADL Goals Pt Will Perform Grooming: with supervision;with set-up;sitting Pt Will Perform Upper Body Bathing: with set-up;with supervision;sitting Pt Will Transfer to Toilet: with +2 assist;with min assist;bedside commode;stand pivot transfer Pt/caregiver will Perform Home Exercise Program: Increased ROM;Increased strength;Right Upper extremity;With Supervision;With written HEP provided  Plan Discharge plan remains appropriate    Co-evaluation    PT/OT/SLP Co-Evaluation/Treatment: Yes Reason for Co-Treatment: Complexity of the patient's impairments (multi-system involvement);For patient/therapist safety   OT goals addressed during session: ADL's and self-care;Strengthening/ROM      End of Session Equipment Utilized During Treatment: Gait belt   Activity Tolerance Patient tolerated treatment well   Patient Left in chair;with call bell/phone within reach;with chair alarm set   Nurse Communication Mobility status        Time: [id number] OT Time Calculation (min): 32 min  Charges: OT General Charges $OT Visit: 1 Procedure OT Treatments $Therapeutic Activity: 8-22 mins  Haleema Vanderheyden,HILLARY 07/11/2015, 1:09 PM   [facility], OTR/L  [phone] 07/11/2015

## 2015-07-11 NOTE — Progress Notes (Signed)
Occupational Therapy Treatment Patient Details Name: [name] MRN: [id number] DOB: 03/26/1940 Today's Date: 07/11/2015    History of present illness Pt adm with septic shock likely due to UTI. Pt with renal failure and on CVVHD. Pt then with Rt perinephric hematoma that required embolization and pt with VDRF. Cardiac arrest 5/3. Reintubated 5/16. Extubated 5/27. PMH - recent (3/28) rt rotator cuff repair, Lt renal cell carcinoma s/p Lt nephrectomy, prostate cancer s/p XRT, PE in 2008, OSA, HTN, HLD, DM, A fib.   OT comments  Received clarification from Dr. Cay Schillings regarding pt's ability to use his RUE "foranything he desires within pain tolerance and to complete self - ROM only". Nsg aware of movement allowed with RUE.  Pt given written HEP for BUE to address ROM and strengthening. Pt very motivated to return to PLOF. If pt continues to progress, he may be a CIR candidate. Pt with prolonged hospitalization and will require extension rehab due to his deconditioning. Will continue to follow acutely.   Follow Up Recommendations  Supervision/Assistance - 24 hour;SNF    Equipment Recommendations  3 in 1 bedside comode    Recommendations for Other Services      Precautions / Restrictions Precautions Precautions: Fall Type of Shoulder Precautions: RTC repiar 04/2015 - per Cay Schillings 6/5 pt can use his RUE however he wants within his pain tolerance. Only self ROM               Praxis      Cognition   Behavior During Therapy: WFL for tasks assessed/performed Overall Cognitive Status: Within Functional Limits for tasks assessed                       Extremity/Trunk Assessment   R shoulder - self ROM FF 0-20; unable to complete self ROM for abduction and ER at this time due to LUE weakness and body habitus.  Pt does well with table slides LUE generalized weakness.Weak scapular muscles. Pt only able to achieve @ 90 degrees FF actively , but able to complete @ 120 FF AAROM  after scapular mobilization/ROM            Exercises Other Exercises Other Exercises: issued therabnad to use as tolerated. Completed LUE strengthening ex  - shoulder extension, elbow flesion/extension 15 reps each Other Exercises: scapular gliding exercises followed by gentle passive stretch of Lshoulder Other Exercises: table slide exercises with RUE protraction/retraction; FF as tolerated Other Exercises: horizontal ab/adduction Other Exercises: given written exercises for FF, abduction and ER in supine using self ROM techniques   Shoulder Instructions       General Comments      Pertinent Vitals/ Pain       Pain Assessment: 0-10 Pain Score: 2  Pain Location: general discomfort Pain Descriptors / Indicators: Grimacing Pain Intervention(s): Limited activity within patient's tolerance  Home Living                                          Prior Functioning/Environment              Frequency Min 2X/week     Progress Toward Goals  OT Goals(current goals can now be found in the care plan section)  Progress towards OT goals: Progressing toward goals  Acute Rehab OT Goals Patient Stated Goal: get stronger and return home OT Goal Formulation: With patient Time For  Goal Achievement: 07/18/15 Potential to Achieve Goals: Good ADL Goals Pt Will Perform Grooming: with supervision;with set-up;sitting Pt Will Perform Upper Body Bathing: with set-up;with supervision;sitting Pt Will Transfer to Toilet: with +2 assist;with min assist;bedside commode;stand pivot transfer Pt/caregiver will Perform Home Exercise Program: Increased ROM;Increased strength;Right Upper extremity;With Supervision;With written HEP provided  Plan Discharge plan remains appropriate;Frequency remains appropriate    Co-evaluation                 End of Session     Activity Tolerance Patient tolerated treatment well   Patient Left in bed;with call bell/phone within reach;with  family/visitor present   Nurse Communication Other (comment);Precautions (Ability to use RUE per [facility] order)        [id number] OT Time Calculation (min): 17 min  Charges: OT General Charges $OT Visit: 1 Procedure OT Treatments $Therapeutic Exercise: 8-22 mins  Yahia Bottger,HILLARY 07/11/2015, 5:21 PM   [facility], OTR/L  [phone] 07/11/2015

## 2015-07-11 NOTE — Progress Notes (Addendum)
[facility] Progress Note  Subjective  - Feels better, but still weak.   Objective Filed Vitals:   07/11/15 0417 07/11/15 0420  BP: 105/74   Pulse: 72   Temp:  97.4 F (36.3 C)  Resp: 20     Intake/Output Summary (Last 24 hours) at 07/11/15 0827 Last data filed at 07/11/15 0600  Gross per 24 hour  Intake 1708.5 ml  Output    705 ml  Net 1003.5 ml   Alert, appears weak in NAD  Assessment/Planning: AKI/CKD: currently on HD via catheter in need of perm access. Has good cephalic and basilic veins bilaterally for access. Still pretty week overall. Will check back later this week.   Alvia Grove 07/11/2015 8:27 AM -- Agree with above.  Pt still very deconditioned and wants more time before access. Will recheck 6/7  Ruta Hinds, MD [facility] [place] Office: [phone] Pager: [phone]  Laboratory CBC    Component Value Date/Time   WBC 15.1* 07/11/2015 0411   HGB 8.6* 07/11/2015 0411   HCT 29.0* 07/11/2015 0411   PLT 258 07/11/2015 0411    BMET    Component Value Date/Time   NA 134* 07/11/2015 0411   K 4.1 07/11/2015 0411   CL 98* 07/11/2015 0411   CO2 25 07/11/2015 0411   GLUCOSE 110* 07/11/2015 0411   BUN 36* 07/11/2015 0411   CREATININE 3.92* 07/11/2015 0411   CALCIUM 8.1* 07/11/2015 0411   GFRNONAA 14* 07/11/2015 0411   GFRAA 16* 07/11/2015 0411    COAG Lab Results  Component Value Date   INR 1.73* 07/11/2015   INR 1.58* 07/10/2015   INR 1.37 07/09/2015   No results found for: PTT  Antibiotics Anti-infectives    Start     Dose/Rate Route Frequency Ordered Stop   06/22/15 2300  vancomycin (VANCOCIN) IVPB 1000 mg/200 mL premix  Status:  Discontinued     1,000 mg 200 mL/hr over 60 Minutes Intravenous Every 24 hours 06/21/15 2118 06/22/15 0921   06/21/15 2300  cefTAZidime (FORTAZ) 2 g in dextrose 5 % 50 mL IVPB  Status:  Discontinued     2 g 100 mL/hr over 30 Minutes Intravenous Every 12  hours 06/21/15 2118 06/22/15 0908   06/21/15 2200  vancomycin (VANCOCIN) 2,000 mg in sodium chloride 0.9 % 500 mL IVPB     2,000 mg 250 mL/hr over 120 Minutes Intravenous  Once 06/21/15 2118 06/22/15 0005   06/17/15 1200  ceFAZolin (ANCEF) 3 g in dextrose 5 % 50 mL IVPB     3 g 130 mL/hr over 30 Minutes Intravenous To Radiology 06/17/15 1156 06/17/15 1251   06/17/15 0924  ceFAZolin (ANCEF) 1-5 GM-% IVPB    Comments:  Covington, Jamie   : cabinet override      06/17/15 0924 06/17/15 1222   06/17/15 0924  ceFAZolin (ANCEF) 2-4 GM/100ML-% IVPB    Comments:  Soyla Dryer   : cabinet override      06/17/15 0924 06/17/15 1222   06/04/15 1100  cefTRIAXone (ROCEPHIN) 2 g in dextrose 5 % 50 mL IVPB     2 g 100 mL/hr over 30 Minutes Intravenous Every 24 hours 06/03/15 1038 06/15/15 1134   06/03/15 1800  cefTAZidime (FORTAZ) 1 g in dextrose 5 % 50 mL IVPB  Status:  Discontinued     1 g 100 mL/hr over 30 Minutes Intravenous Every 24 hours 06/03/15 0737 06/03/15 0956   06/03/15 1100  cefTRIAXone (ROCEPHIN) 1  g in dextrose 5 % 50 mL IVPB  Status:  Discontinued     1 g 100 mL/hr over 30 Minutes Intravenous Every 24 hours 06/03/15 0956 06/03/15 1038   06/02/15 1800  cefTAZidime (FORTAZ) 2 g in dextrose 5 % 50 mL IVPB  Status:  Discontinued     2 g 100 mL/hr over 30 Minutes Intravenous Every 12 hours 06/02/15 1306 06/03/15 0737   05/31/15 1800  cefTAZidime (FORTAZ) 2 g in dextrose 5 % 50 mL IVPB  Status:  Discontinued     2 g 100 mL/hr over 30 Minutes Intravenous Every 48 hours 05/31/15 1444 06/02/15 1306   05/31/15 1130  vancomycin (VANCOCIN) 2,250 mg in sodium chloride 0.9 % 500 mL IVPB  Status:  Discontinued     2,250 mg 250 mL/hr over 120 Minutes Intravenous  Once 05/31/15 1124 05/31/15 1128   05/31/15 1130  vancomycin (VANCOCIN) 2,500 mg in sodium chloride 0.9 % 500 mL IVPB     2,500 mg 250 mL/hr over 120 Minutes Intravenous  Once 05/31/15 1128 05/31/15 1347   05/31/15 1115   piperacillin-tazobactam (ZOSYN) IVPB 3.375 g     3.375 g 100 mL/hr over 30 Minutes Intravenous  Once 05/31/15 1114 05/31/15 [place], PA-C [facility] Office: [phone] Pager: [phone] 07/11/2015 8:27 AM

## 2015-07-12 DIAGNOSIS — R109 Unspecified abdominal pain: Secondary | ICD-10-CM

## 2015-07-12 DIAGNOSIS — S270XXD Traumatic pneumothorax, subsequent encounter: Secondary | ICD-10-CM

## 2015-07-12 LAB — GLUCOSE, CAPILLARY
Glucose-Capillary: 75 mg/dL (ref 65–99)
Glucose-Capillary: 117 mg/dL — ABNORMAL HIGH (ref 65–99)
Glucose-Capillary: 77 mg/dL (ref 65–99)
Glucose-Capillary: 161 mg/dL — ABNORMAL HIGH (ref 65–99)

## 2015-07-12 LAB — RENAL FUNCTION PANEL
ALBUMIN: 2.2 g/dL — AB (ref 3.5–5.0)
Anion gap: 12 (ref 5–15)
BUN: 49 mg/dL — AB (ref 6–20)
CHLORIDE: 94 mmol/L — AB (ref 101–111)
CO2: 25 mmol/L (ref 22–32)
Calcium: 8.2 mg/dL — ABNORMAL LOW (ref 8.9–10.3)
Creatinine, Ser: 4.43 mg/dL — ABNORMAL HIGH (ref 0.61–1.24)
GFR calc Af Amer: 14 mL/min — ABNORMAL LOW (ref >60)
GFR calc non Af Amer: 12 mL/min — ABNORMAL LOW (ref >60)
GLUCOSE: 106 mg/dL — AB (ref 65–99)
PHOSPHORUS: 4.7 mg/dL — AB (ref 2.5–4.6)
POTASSIUM: 4.7 mmol/L (ref 3.5–5.1)
Sodium: 131 mmol/L — ABNORMAL LOW (ref 135–145)

## 2015-07-12 LAB — PROTIME-INR
INR: 2.11 — ABNORMAL HIGH (ref 0.00–1.49)
Prothrombin Time: 23.5 seconds — ABNORMAL HIGH (ref 11.6–15.2)

## 2015-07-12 LAB — CBC
HCT: 28.6 % — ABNORMAL LOW (ref 39.0–52.0)
HEMOGLOBIN: 8.6 g/dL — AB (ref 13.0–17.0)
MCH: 29.3 pg (ref 26.0–34.0)
MCHC: 30.1 g/dL (ref 30.0–36.0)
MCV: 97.3 fL (ref 78.0–100.0)
PLATELETS: 218 10*3/uL (ref 150–400)
RBC: 2.94 MIL/uL — ABNORMAL LOW (ref 4.22–5.81)
RDW: 21.1 % — AB (ref 11.5–15.5)
WBC: 11.2 10*3/uL — ABNORMAL HIGH (ref 4.0–10.5)

## 2015-07-12 LAB — HEPARIN LEVEL (UNFRACTIONATED): HEPARIN UNFRACTIONATED: 0.61 [IU]/mL (ref 0.30–0.70)

## 2015-07-12 MED ORDER — DARBEPOETIN ALFA 100 MCG/0.5ML IJ SOSY
PREFILLED_SYRINGE | INTRAMUSCULAR | Status: AC
  Filled 2015-07-12: qty 0.5

## 2015-07-12 MED ORDER — DARBEPOETIN ALFA 200 MCG/0.4ML IJ SOSY
PREFILLED_SYRINGE | INTRAMUSCULAR | Status: AC
  Administered 2015-07-12: 200 ug via INTRAVENOUS
  Filled 2015-07-12: qty 0.4

## 2015-07-12 MED ORDER — WARFARIN SODIUM 5 MG PO TABS
5.0000 mg | ORAL_TABLET | Freq: Once | ORAL | Status: AC
Start: 2015-07-12 — End: 2015-07-12
  Administered 2015-07-12: 5 mg via ORAL
  Filled 2015-07-12: qty 1

## 2015-07-12 MED ORDER — OXYCODONE HCL 5 MG PO TABS
5.0000 mg | ORAL_TABLET | ORAL | Status: DC | PRN
  Administered 2015-07-13 – 2015-07-30 (×8): 5 mg via ORAL
  Filled 2015-07-12 (×10): qty 1

## 2015-07-12 NOTE — Progress Notes (Signed)
ANTICOAGULATION CONSULT NOTE - Follow Up Consult  Pharmacy Consult for Coumadin Indication: hx afib/DVT/PE and acute DVT  No Known Allergies  Patient Measurements: Height: 5\' 10"  (177.8 cm) Weight: 244 lb 11.4 oz (111 kg) IBW/kg (Calculated) : 73   Vital Signs: Temp: 97.8 F (36.6 C) (06/06 0823) Temp Source: Oral (06/06 0823) BP: 115/77 mmHg (06/06 0729) Pulse Rate: 53 (06/06 0729)  Labs:  Recent Labs  07/10/15 0529  07/10/15 1443 07/11/15 0411 07/12/15 0414  HGB  --   < > 8.7* 8.6* 8.6*  HCT  --   --  29.5* 29.0* 28.6*  PLT  --   --  241 258 218  LABPROT 18.9*  --   --  20.2* 23.5*  INR 1.58*  --   --  1.73* 2.11*  HEPARINUNFRC 0.55  --   --  0.63 0.61  CREATININE 2.60*  --   --  3.92* 4.43*  < > = values in this interval not displayed.  Estimated Creatinine Clearance: 18.5 mL/min (by C-G formula based on Cr of 4.43).  Assessment: 74yom on coumadin pta for hx afib, DVT/PE, admitted with hemorrhagic shock, coumadin held, and INR reversed. He was found to have an acute DVT in his right leg on 5/11. Hemorrhagic shock resolved and he was started on IV heparin 5/18. Coumadin resumed 5/31. 5 day overlap completed on 6/4 but heparin continued as INR remained below goal. Today's INR is finally therapeutic at 2.11 so can stop heparin. Hgb low but stable. Hematuria persists per RN.  Home dose: 6mg  MWF, 5mg  all other days  Goal of Therapy:  INR 2-3 Monitor platelets by anticoagulation protocol: Yes   Plan:  1) Discontinue heparin 2) Coumadin 5mg  x 1 3) Daily INR  Deboraha Sprang 07/12/2015,10:09 AM

## 2015-07-12 NOTE — Progress Notes (Signed)
Nutrition Follow-up  DOCUMENTATION CODES:   Obesity unspecified  INTERVENTION:  -Magic Cup TID. Each supplement provides 290 kcals and 9 grams of protein.   NUTRITION DIAGNOSIS:   Inadequate oral intake related to dysphagia as evidenced by meal completion < 25%. Ongoing  GOAL:   Patient will meet greater than or equal to 90% of their needs Meeting minimally    MONITOR:   PO intake, Supplement acceptance, Labs, Weight trends, I & O's  ASSESSMENT:   75 y.o. male with PMH as outlined below including prostate CA s/p radiation 2011 and RCC s/p left nephrectomy. He was seen in ED 4/22 for clogged catheter which was irrigated and replaced. He apparently has had hematuria for quite some time which has been attributed to scarring from prior radiation.  4/25 Admitted with septic shock due to UTI. Significant ARF. 4/30 transferred out of ICU. 5/03 Cardiac arrest, anemia >> coil embolization Rt kidney by IR; PTX post-CPR 5/16 Reintubated due to airway secretions 5/18 start heparin gtt 5/27- extubated 5/28 off cvvhd 6/4 Cortrak tube removed   PO's 0-100%. Pt reports eating better the past 2 days.  Pt enjoys magic cup, will continue this supplement.  Pt reports difficulty eating [place] and chicken. It needs be chopped into smaller pieces or minced. No difficulty with other meats or foods.   Weight is 22 lbs below admission weight. Weight has ranged from 266 lbs 4/25-->316 lbs 5/9-->244 lbs 6/6. Weight has been more stable since 5/20.  Labs reviewed; Na 131, Cl 94, BUN 49, creat 4.43, Ca 8.2, GFR 12, phos 4.7, CBGs 121-214 Meds reviewed; MVI w/ minerals, warfarin   Diet Order:  DIET DYS 2 Room service appropriate?: Yes; Fluid consistency:: Nectar Thick  Skin:  Reviewed, no issues  Last BM:  5/30  Height:   Ht Readings from Last 1 Encounters:  07/09/15 5\' 10"  (1.778 m)    Weight:   Wt Readings from Last 1 Encounters:  07/12/15 244 lb 0.8 oz (110.7 kg)    Ideal Body  Weight:  72.7 kg  BMI:  Body mass index is 35.02 kg/(m^2).  Estimated Nutritional Needs:   Kcal:  1950-2150  Protein:  110-130 gm  Fluid:  1.2 L  EDUCATION NEEDS:   No education needs identified at this time  Geoffery Lyons, [place] Dietetic Intern Pager [phone]

## 2015-07-12 NOTE — Progress Notes (Signed)
S: Eating better  Still quite weak though improving O:BP 115/77 mmHg  Pulse 53  Temp(Src) 98 F (36.7 C) (Axillary)  Resp 22  Ht 5\' 10"  (1.778 m)  Wt 111 kg (244 lb 11.4 oz)  BMI 35.11 kg/m2  SpO2 100%  Intake/Output Summary (Last 24 hours) at 07/12/15 0740 Last data filed at 07/12/15 0345  Gross per 24 hour  Intake 1184.5 ml  Output    725 ml  Net  459.5 ml   Weight change: 2.8 kg (6 lb 2.8 oz) [id number] and alert CVS: irreg,irreg Resp: Clear Abd:+ BS NTND Ext: 0-tr edema NEURO: CNI Ox3 no asterixis Rt IJ Permcath   . antiseptic oral rinse  7 mL Mouth Rinse BID  . calcitRIOL  0.25 mcg Per Tube Q48H  . darbepoetin (ARANESP) injection - DIALYSIS  200 mcg Intravenous Q Tue-HD  . heparin  40 Units/kg Dialysis Once in dialysis  . insulin aspart  0-15 Units Subcutaneous TID WC  . insulin aspart  0-5 Units Subcutaneous QHS  . insulin NPH Human  13 Units Subcutaneous BID AC & HS  . midodrine  10 mg Oral TID AC  . multivitamin  1 tablet Oral QHS  . predniSONE  10 mg Oral BID WC  . tamsulosin  0.4 mg Oral Daily  . Warfarin - Pharmacist Dosing Inpatient   Does not apply q1800   No results found. BMET    Component Value Date/Time   NA 131* 07/12/2015 0414   K 4.7 07/12/2015 0414   CL 94* 07/12/2015 0414   CO2 25 07/12/2015 0414   GLUCOSE 106* 07/12/2015 0414   BUN 49* 07/12/2015 0414   CREATININE 4.43* 07/12/2015 0414   CALCIUM 8.2* 07/12/2015 0414   GFRNONAA 12* 07/12/2015 0414   GFRAA 14* 07/12/2015 0414   CBC    Component Value Date/Time   WBC 11.2* 07/12/2015 0414   RBC 2.94* 07/12/2015 0414   RBC 2.67* 04/04/2013 0013   HGB 8.6* 07/12/2015 0414   HCT 28.6* 07/12/2015 0414   PLT 218 07/12/2015 0414   MCV 97.3 07/12/2015 0414   MCH 29.3 07/12/2015 0414   MCHC 30.1 07/12/2015 0414   RDW 21.1* 07/12/2015 0414   LYMPHSABS 1.4 07/11/2015 0411   MONOABS 0.6 07/11/2015 0411   EOSABS 0.0 07/11/2015 0411   BASOSABS 0.0 07/11/2015 0411     Assessment: 1.   Acute on CKD 3, Scr sl higher today 2. Klebsiella bacteremia 3.  Sec HPTH on rocaltrol 4. Anemia on aranesp 5. Hypotension, on midodrine  Plan: 1.  Plan HD today  [name] T

## 2015-07-12 NOTE — Progress Notes (Signed)
Hemodialysis- Tolerated well. BP low today. UF 0.5L. Report called to primary RN. Pt has no complaints.

## 2015-07-12 NOTE — Procedures (Signed)
Pt seen on HD.  Ap 150 Vp 140  BFR 400.  Tolerating HD well so far.  SBP 85.

## 2015-07-12 NOTE — Progress Notes (Signed)
PROGRESS NOTE    [name]  [id number] DOB: 1940/06/25 DOA: 05/31/2015 PCP: Melinda Crutch   Brief Narrative:  [name] is a 75 y.o. WM PMHx Pulmonary emboli 2008,DVT (deep venous thrombosis), HTN, Dysrhythmia, HLD, DM type II with complication, Prostate CA s/p radiation 2011 and RCC s/p Lt Nephrectomy.   He was seen in ED 4/22 for clogged catheter which was irrigated and replaced. He apparently has had hematuria for quite some time which has been attributed to scarring from prior radiation.  He had been in his USOH until roughly 4/20 when he had decreased appetite. He had not been eating or drinking for the past 5 days. On 04/25 he went to have his INR checked (on coumadin for A.fib and hx DVT / PE) and was found to have INR of 8 and was hypotensive with SBP 70/44. He was subsequently sent to ED for further evaluation.  In ED, he was found to have UTI and despite 4.5L IVF, he remained hypotensive. He also had multiple metabolic derangements including Na 124, K 5.0, AGMA, lactate 3, SCr 9.36. PCCM was therefore consulted for admission of septic shock due to urosepsis as well as AKI.    Assessment & Plan:   Active Problems:   Essential hypertension   Gross hematuria   Atrial fibrillation (HCC)   Septic shock (HCC)   Urinary tract infectious disease   Encounter for central line placement   Acute renal failure (HCC)   Dyspnea   Bacteremia due to Klebsiella pneumoniae   Secondary cardiomyopathy ([place])   Chronic atrial fibrillation ([place])   Uncontrolled type 2 diabetes mellitus with complication (HCC)   Perinephric hematoma   Right flank pain   Acute respiratory failure (HCC)   Acute back pain   Bleeding   Chest tube in place   Acute hypoxemic respiratory failure (HCC)   Acute renal failure (ARF) (HCC)   Acute on chronic renal failure ([place])   Hemorrhagic shock   S/p nephrectomy   Sepsis due to Klebsiella (HCC)   OSA (obstructive sleep apnea)   Chronic diastolic  CHF (congestive heart failure) ([place])   Cardiac arrest (HCC)   Acute deep vein thrombosis (DVT) of distal vein of right lower extremity (HCC)   Traumatic pneumothorax   OSA on CPAP   Acute Hypoxic Respiratory Failure  - Post Cardiac Arrest. Resolved  Pneumothorax after CPR 5/03 - chest tubes removed 5/14.  OSA. On CPAP -CPAP per respiratory  Cardiac Arrest  Chronic diastolic CHF -Strict in and out since admission - 1.5 L -Daily weight Filed Weights   07/12/15 0338 07/12/15 1308 07/12/15 1717  Weight: 111 kg (244 lb 11.4 oz) 110.7 kg (244 lb 0.8 oz) 110.1 kg (242 lb 11.6 oz)    Hemorrhagic Shock/Acute Blood loss Anemia  -secondary to Rt Perinephric Hematoma.  -Resolved Recent Labs Lab 07/08/15 0746 07/09/15 0941 07/10/15 1443 07/11/15 0411 07/12/15 0414  HGB 8.5* 8.6* 8.7* 8.6* 8.6*    Leukocytosis  - Unclear etiology. Patient clinically improved  Right Gastocnemious vein Acute DVT/DVT Right femoral & popliteal veins -Treatment dose heparin. Monitor hemoglobin closely secondary to patient's episode of hemorrhagic shock. -Hemoglobin stable  Ischemic Changes Left Foot  -Continue heparin gtt / coumadin  -PT/OT; recommend CIR /LTAC   Acute on Chronic Renal Failure(now HD per nephrology)  Lab Results  Component Value Date   CREATININE 4.43* 07/12/2015   CREATININE 3.92* 07/11/2015   CREATININE 2.60* 07/10/2015  -NOTE; patient has used catheter for 10years, with  occasional hematuria -Volume removal per HD  H/O Left Nephrectomy for RCC  Hematuria -Patient and wife understand must continue with anticoagulation secondary to risks of DVT propagating -Flush Foley catheter q 2hr/PRN  Dysphagia -Passed swallow evaluation dysphagia 2 diet nectar thick liquid  Sepsis/positive Klebsiella Bacteremia  - Completed Antibiotic course 5/10.  DM type 2 uncontrolled  With complication -5/2 [id number] 7.8 -Hemoglobin 13 units BID -Moderate SSI  Acute Encephalopathy    -Resolved  Goals of care -PT/OT recommends SNF -Ambulate patient q shift with assistance   DVT prophylaxis: Treatment dose heparin--> Coumadin Code Status: Full Family Communication: None Disposition Plan: SNF   Consultants:  Dr. Jeneen Rinks [place] nephrology Dr.Charles E Fields vascular surgery Dr. Latanya Maudlin orthopedics Dr.Benjamin Ardis Hughs urology Dr Rigoberto Noel.  PC CM    Procedures/Significant Events:  4/26 Renal u/s >> s/p Lt nephrectomy 4/27 CT abd/pelvis >> Rt perinephric hematoma 5/02 CT abd/pelvis >> increased size of hematoma 5/03 Cardiac arrest, anemia >> coil embolization Rt kidney by IR; PTX post-CPR 5/08 Echo >> EF 65 to 70% 5/16 Reintubated due to airway secretions 5/18 Doppler legs b/l >> Acute DVT Rt gastrocnemius vein, age indeterminate DVT Rt femoral vein and popliteal vein 5/18 start heparin gtt 5/22 Port CXR: Support lines and tubes remain in position. Low lung volumes. Partial silhouetting of left hemidiaphragm without obvious opacity. 5/23 TTE: Moderate LVH. EF 65-70%. No wall motion abnormalities. LA mildly dilated. No AS or AR. No MR. RV normal in size & function. No effusion. 5/25 LLE Arterial Duplex: Prelim report. No evidence of stenosis or occluded arteries. Triphasic blood flow throughout left leg. 5/27- extubated 5/28 off cvvhd 6/4 CorTrak tube removed   Cultures 4/25 Blood >> positive Klebsiella pneumoniae 4/27 Blood >> negative 5/16 BAL >> oral flora 5/27 Trach Asp: Negative final 5/27 Blood right AC/hand negative final   Antimicrobials: 4/28 Rocephin >> 5/10   Devices    LINES / TUBES:  Rt IJ HD cath 4/26 - 5/12 ETT 5/3 - 5/13; 5/16 - 5/27 Lt femoral CVL 5/3 - 5/17 Lt chest tube 5/3 - 5/14 Rt chest tube 5/3 - 5/14 Rt IJ tunneled HD cath 5/12 >>  Lt [place] CVL 5/17 >>  NGT 5/8 >> 6/4 PIV x1    Continuous Infusions: . sodium chloride Stopped (07/12/15 1200)     Subjective: 6/6  A/O 4, Laying comfortably  in bed. Joking with staff, patient's voice much stronger. Negative SOB, negative abdominal/CVA tenderness, negative leg pain. states was able to take a couple steps yesterday with help.     Objective: Filed Vitals:   07/12/15 1717 07/12/15 1752 07/12/15 1935 07/12/15 1937  BP: 100/66 100/66  104/56  Pulse: 70 81  86  Temp: 98.1 F (36.7 C) 98.5 F (36.9 C) 98.5 F (36.9 C)   TempSrc: Oral Oral Oral   Resp: 19 22  22   Height:      Weight: 110.1 kg (242 lb 11.6 oz)     SpO2: 98% 99%  98%    Intake/Output Summary (Last 24 hours) at 07/12/15 2043 Last data filed at 07/12/15 2000  Gross per 24 hour  Intake 1826.63 ml  Output    932 ml  Net 894.63 ml   Filed Weights   07/12/15 0338 07/12/15 1308 07/12/15 1717  Weight: 111 kg (244 lb 11.4 oz) 110.7 kg (244 lb 0.8 oz) 110.1 kg (242 lb 11.6 oz)    Examination:  General:   A/O 4, Laying in bed comfortably.No  acute respiratory distress Eyes: negative scleral hemorrhage, negative anisocoria, negative icterus ENT: Negative Runny nose, negative gingival bleeding,  Neck:  Negative scars, masses, torticollis, lymphadenopathy, JVD Lungs: Clear to auscultation bilaterally without wheezes or crackles. Right chest wall HD cath present, negative sign of infection Cardiovascular: Regular rate and rhythm without murmur gallop or rub normal S1 and S2 Abdomen: negative abdominal pain, nondistended, positive soft, bowel sounds, no rebound, no ascites, no appreciable mass Extremities: No significant cyanosis, clubbing, or edema bilateral lower extremities(TED hose present) Skin: Negative rashes, lesions, ulcers Psychiatric:  Negative depression, negative anxiety, negative fatigue, negative mania  Central nervous system:  Cranial nerves II through XII intact, tongue/uvula midline, all extremities muscle strength 5/5, sensation intact throughout,  negative dysarthria, negative expressive aphasia, negative receptive aphasia. .     Data Reviewed:  Care during the described time interval was provided by me .  I have reviewed this patient's available data, including medical history, events of note, physical examination, and all test results as part of my evaluation. I have personally reviewed and interpreted all radiology studies.  CBC:  Recent Labs Lab 07/06/15 0445 07/07/15 0525 07/08/15 0746 07/09/15 0941 07/10/15 1443 07/11/15 0411 07/12/15 0414  WBC 9.0 7.7 11.2* 11.4* 15.7* 15.1* 11.2*  NEUTROABS 7.4 6.4 9.5*  --   --  13.1*  --   HGB 8.2* 7.6* 8.5* 8.6* 8.7* 8.6* 8.6*  HCT 27.6* 25.2* 29.2* 29.8* 29.5* 29.0* 28.6*  MCV 93.9 93.7 97.3 96.4 96.1 95.7 97.3  PLT 258 264 270 289 241 258 [id number]   Basic Metabolic Panel:  Recent Labs Lab 07/07/15 0525 07/08/15 0522 07/08/15 0746 07/09/15 0400 07/09/15 0941 07/10/15 0529 07/11/15 0411 07/12/15 0414  NA 136 135  --  133* 134* 132* 134* 131*  K 3.7 4.0  --  4.6 4.4 3.7 4.1 4.7  CL 98* 99*  --  98* 99* 95* 98* 94*  CO2 26 27  --  25 25 26 25 25   GLUCOSE 113* 113*  --  136* 116* 99 110* 106*  BUN 69* 27*  --  46* 48* 20 36* 49*  CREATININE 4.27* 2.75*  --  4.00* 4.24* 2.60* 3.92* 4.43*  CALCIUM 8.4* 8.2*  --  8.3* 8.4* 8.2* 8.1* 8.2*  MG  --   --  2.1  --  2.1  --  1.9  --   PHOS 5.2* 2.7  --  3.5  --  2.5  --  4.7*   GFR: Estimated Creatinine Clearance: 18.4 mL/min (by C-G formula based on Cr of 4.43). Liver Function Tests:  Recent Labs Lab 07/08/15 0522 07/09/15 0400 07/10/15 0529 07/11/15 0411 07/12/15 0414  AST  --   --   --  42*  --   ALT  --   --   --  62  --   ALKPHOS  --   --   --  96  --   BILITOT  --   --   --  0.9  --   PROT  --   --   --  5.3*  --   ALBUMIN 2.1* 2.0* 2.2* 2.1* 2.2*   No results for input(s): LIPASE, AMYLASE in the last 168 hours. No results for input(s): AMMONIA in the last 168 hours. Coagulation Profile:  Recent Labs Lab 07/08/15 0523 07/09/15 0400 07/10/15 0529 07/11/15 0411 07/12/15 0414  INR 1.35 1.37 1.58* 1.73*  2.11*   Cardiac Enzymes: No results for input(s): CKTOTAL, CKMB, CKMBINDEX, TROPONINI in the last  168 hours. BNP (last 3 results) No results for input(s): PROBNP in the last 8760 hours. HbA1C: No results for input(s): HGBA1C in the last 72 hours. CBG:  Recent Labs Lab 07/11/15 1938 07/11/15 2113 07/12/15 0820 07/12/15 1159 07/12/15 1755  GLUCAP 137* 136* 75 117* 77   Lipid Profile: No results for input(s): CHOL, HDL, LDLCALC, TRIG, CHOLHDL, LDLDIRECT in the last 72 hours. Thyroid Function Tests: No results for input(s): TSH, T4TOTAL, FREET4, T3FREE, THYROIDAB in the last 72 hours. Anemia Panel: No results for input(s): VITAMINB12, FOLATE, FERRITIN, TIBC, IRON, RETICCTPCT in the last 72 hours. Urine analysis:    Component Value Date/Time   COLORURINE RED* 05/31/2015 1158   APPEARANCEUR TURBID* 05/31/2015 1158   LABSPEC 1.021 05/31/2015 1158   PHURINE 7.5 05/31/2015 1158   GLUCOSEU NEGATIVE 05/31/2015 1158   HGBUR LARGE* 05/31/2015 1158   BILIRUBINUR LARGE* 05/31/2015 1158   KETONESUR 40* 05/31/2015 1158   PROTEINUR >300* 05/31/2015 1158   UROBILINOGEN 0.2 03/06/2013 1748   NITRITE POSITIVE* 05/31/2015 1158   LEUKOCYTESUR LARGE* 05/31/2015 1158   Sepsis Labs: @LABRCNTIP (procalcitonin:4,lacticidven:4)  ) Recent Results (from the past 240 hour(s))  C difficile quick scan w PCR reflex     Status: None   Collection Time: 07/07/15  8:41 PM  Result Value Ref Range Status   C Diff antigen NEGATIVE NEGATIVE Final   C Diff toxin NEGATIVE NEGATIVE Final   C Diff interpretation Negative for toxigenic C. difficile  Final  MRSA PCR Screening     Status: None   Collection Time: 07/11/15 12:03 PM  Result Value Ref Range Status   MRSA by PCR NEGATIVE NEGATIVE Final    Comment:        The GeneXpert MRSA Assay (FDA approved for NASAL specimens only), is one component of a comprehensive MRSA colonization surveillance program. It is not intended to diagnose MRSA infection nor  to guide or monitor treatment for MRSA infections.          Radiology Studies: No results found.      Scheduled Meds: . antiseptic oral rinse  7 mL Mouth Rinse BID  . calcitRIOL  0.25 mcg Per Tube Q48H  . darbepoetin (ARANESP) injection - DIALYSIS  200 mcg Intravenous Q Tue-HD  . heparin  40 Units/kg Dialysis Once in dialysis  . insulin aspart  0-15 Units Subcutaneous TID WC  . insulin aspart  0-5 Units Subcutaneous QHS  . insulin NPH Human  13 Units Subcutaneous BID AC & HS  . midodrine  10 mg Oral TID AC  . multivitamin  1 tablet Oral QHS  . predniSONE  10 mg Oral BID WC  . tamsulosin  0.4 mg Oral Daily  . Warfarin - Pharmacist Dosing Inpatient   Does not apply q1800   Continuous Infusions: . sodium chloride Stopped (07/12/15 1200)     LOS: 42 days    Time spent: 40 minutes    Trevor Duty, Geraldo Docker, MD [facility] Pager [phone]   If 7PM-7AM, please contact night-coverage [email or web address] Password [facility] 07/12/2015, 8:43 PM

## 2015-07-13 ENCOUNTER — Telehealth: Payer: Self-pay | Admitting: Pulmonary Disease

## 2015-07-13 LAB — RENAL FUNCTION PANEL
ANION GAP: 13 (ref 5–15)
Albumin: 2 g/dL — ABNORMAL LOW (ref 3.5–5.0)
BUN: 21 mg/dL — ABNORMAL HIGH (ref 6–20)
CHLORIDE: 93 mmol/L — AB (ref 101–111)
CO2: 25 mmol/L (ref 22–32)
Calcium: 7.8 mg/dL — ABNORMAL LOW (ref 8.9–10.3)
Creatinine, Ser: 2.78 mg/dL — ABNORMAL HIGH (ref 0.61–1.24)
GFR calc non Af Amer: 21 mL/min — ABNORMAL LOW (ref >60)
GFR, EST AFRICAN AMERICAN: 24 mL/min — AB (ref >60)
Glucose, Bld: 82 mg/dL (ref 65–99)
Phosphorus: 3.8 mg/dL (ref 2.5–4.6)
Potassium: 3.8 mmol/L (ref 3.5–5.1)
Sodium: 131 mmol/L — ABNORMAL LOW (ref 135–145)

## 2015-07-13 LAB — GLUCOSE, CAPILLARY
GLUCOSE-CAPILLARY: 153 mg/dL — AB (ref 65–99)
GLUCOSE-CAPILLARY: 122 mg/dL — AB (ref 65–99)
GLUCOSE-CAPILLARY: 175 mg/dL — AB (ref 65–99)
Glucose-Capillary: 89 mg/dL (ref 65–99)

## 2015-07-13 LAB — CBC
HCT: 29.5 % — ABNORMAL LOW (ref 39.0–52.0)
HEMOGLOBIN: 8.9 g/dL — AB (ref 13.0–17.0)
MCH: 28.6 pg (ref 26.0–34.0)
MCHC: 30.2 g/dL (ref 30.0–36.0)
MCV: 94.9 fL (ref 78.0–100.0)
PLATELETS: 199 10*3/uL (ref 150–400)
RBC: 3.11 MIL/uL — AB (ref 4.22–5.81)
RDW: 20.9 % — ABNORMAL HIGH (ref 11.5–15.5)
WBC: 8.2 10*3/uL (ref 4.0–10.5)

## 2015-07-13 LAB — PROTIME-INR
INR: 2.1 — ABNORMAL HIGH (ref 0.00–1.49)
Prothrombin Time: 23.4 seconds — ABNORMAL HIGH (ref 11.6–15.2)

## 2015-07-13 MED ORDER — CALCITRIOL 1 MCG/ML PO SOLN
0.2500 ug | ORAL | Status: DC
  Administered 2015-07-15 – 2015-07-29 (×8): 0.25 ug via ORAL
  Filled 2015-07-13 (×9): qty 0.25

## 2015-07-13 MED ORDER — WARFARIN SODIUM 5 MG PO TABS
7.5000 mg | ORAL_TABLET | Freq: Once | ORAL | Status: AC
  Administered 2015-07-13: 7.5 mg via ORAL
  Filled 2015-07-13: qty 2

## 2015-07-13 MED ORDER — ACETAMINOPHEN 160 MG/5ML PO SOLN
650.0000 mg | Freq: Four times a day (QID) | ORAL | Status: DC | PRN
  Administered 2015-07-17 – 2015-07-31 (×12): 650 mg via ORAL
  Filled 2015-07-13 (×17): qty 20.3

## 2015-07-13 MED ORDER — CALCITRIOL 1 MCG/ML PO SOLN
0.2500 ug | ORAL | Status: DC
  Administered 2015-07-13: 0.25 ug
  Filled 2015-07-13: qty 0.25

## 2015-07-13 MED ORDER — PREDNISONE 10 MG PO TABS
10.0000 mg | ORAL_TABLET | Freq: Every day | ORAL | Status: DC
  Administered 2015-07-15 – 2015-07-16 (×2): 10 mg via ORAL
  Filled 2015-07-13 (×2): qty 1

## 2015-07-13 MED ORDER — GUAIFENESIN 100 MG/5ML PO SOLN
5.0000 mL | ORAL | Status: DC | PRN
  Administered 2015-07-24 – 2015-07-25 (×2): 100 mg via ORAL
  Filled 2015-07-13 (×7): qty 5

## 2015-07-13 NOTE — Progress Notes (Signed)
Pt refused 1200 foley flush.  States irritates and has had no clots. Will let RN know if wants foley to be flushed.  Will continue to monitor.

## 2015-07-13 NOTE — Progress Notes (Signed)
Placed patient on CPAP set at 10cm for the night, pt states it is comfortable

## 2015-07-13 NOTE — Progress Notes (Signed)
Pt refuses 0800 foley catheter flush, pt states he has not had any blood clots in 2 days and is afraid the flushes will irritate bladder and cause more bleeding. Pt had 150cc pink urine out of bladder , will continue to monitor output and review flush due at noon. Consuelo Pandy RN

## 2015-07-13 NOTE — Progress Notes (Signed)
Physical Therapy Treatment Patient Details Name: AMIE SPAWN MRN: [id number] DOB: 12-Oct-1940 Today's Date: 07/13/2015    History of Present Illness Pt adm with septic shock likely due to UTI. Pt with renal failure and on CVVHD. Pt then with Rt perinephric hematoma that required embolization and pt with VDRF. Cardiac arrest 5/3. Reintubated 5/16. Extubated 5/27. PMH - recent (3/28) rt rotator cuff repair, Lt renal cell carcinoma s/p Lt nephrectomy, prostate cancer s/p XRT, PE in 2008, OSA, HTN, HLD, DM, A fib.    PT Comments    Pt admitted with above diagnosis. Pt currently with functional limitations due to balance and endurance deficits. Pt was able to use Stedy to get to chair.  Could not stand as long today with HR going up more than usual.  Pt fatigued today more than last treatment.  Pt will benefit from skilled PT to increase their independence and safety with mobility to allow discharge to the venue listed below.    Follow Up Recommendations  CIR;LTACH (possibly in the future depending on progress)     Equipment Recommendations  Other (comment) (To be assessed)    Recommendations for Other Services       Precautions / Restrictions Precautions Precautions: Fall Type of Shoulder Precautions: RTC repiar 04/2015 - per Cay Schillings 6/5 pt can use his RUE however he wants within his pain tolerance. Only self ROM Precaution Comments: Per Dr Titus Mould who called Dr. Gladstone Lighter,  Pt able to weight bear through Rt UE, but no ROM of Rt shoulder. Sent fax to [phone] 6/5 asking for specific ROM [place] as pt is over 2 months out from repair.  Restrictions Weight Bearing Restrictions: No    Mobility  Bed Mobility Overal bed mobility: Needs Assistance Bed Mobility: Rolling;Sidelying to Sit Rolling: Min guard   Supine to sit: Min assist;+2 for physical assistance     General bed mobility comments: pt pushing through LUE. Limited pushing through RUE   Transfers Overall transfer level:  Needs assistance Equipment used: 2 person hand held assist Transfers: Sit to/from [facility] Sit to Stand: Mod assist;+2 physical assistance Stand pivot transfers: Mod assist;+2 physical assistance;From elevated surface (with stedy)       General transfer comment: Pt needed assist to power up today.  Felt weaker.  Stood x 2 for 1 minute the first attempt and 40 seconds the second attempt.  HR incr therefore did not push pt.  Used Stedy to move pt to chair.   Ambulation/Gait                 Stairs            Wheelchair Mobility    Modified Rankin (Stroke Patients Only)       Balance Overall balance assessment: Needs assistance Sitting-balance support: No upper extremity supported;Feet supported Sitting balance-Leahy Scale: Fair Sitting balance - Comments: able to tolerate static sitting at EOB without physical assist today   Standing balance support: Single extremity supported;During functional activity Standing balance-Leahy Scale: Poor Standing balance comment: requires min guard to min assist to maintain upright stance in stedy with UE support.                     Cognition Arousal/Alertness: Awake/alert Behavior During Therapy: WFL for tasks assessed/performed Overall Cognitive Status: Within Functional Limits for tasks assessed                      Exercises General Exercises - Lower  Extremity Ankle Circles/Pumps: AROM;Right;Left;10 reps;Seated Quad Sets: AROM;Both;15 reps;Seated Gluteal Sets: AROM;Both;15 reps;Seated Heel Slides: AAROM;10 reps;Both;Seated Other Exercises Other Exercises: Set pt up to perform his towel exercises on the bedside table.     General Comments        Pertinent Vitals/Pain Pain Assessment: No/denies pain  HR up to 155 bpm.      Home Living                      Prior Function            PT Goals (current goals can now be found in the care plan section) Progress towards PT  goals: Progressing toward goals    Frequency  Min 3X/week    PT Plan Current plan remains appropriate    Co-evaluation             End of Session Equipment Utilized During Treatment: Gait belt;Oxygen Activity Tolerance: Patient limited by fatigue Patient left: in chair;with call bell/phone within reach;with family/visitor present     Time: 1137-1202 PT Time Calculation (min) (ACUTE ONLY): 25 min  Charges:  $Therapeutic Activity: 23-37 mins                    G Codes:      Ashten Prats, Arrie Aran F 07/17/15, 1:30 PM [employer] Acute Rehabilitation [phone] (pager)

## 2015-07-13 NOTE — Progress Notes (Signed)
S: Eating more O:BP 110/72 mmHg  Pulse 75  Temp(Src) 98.4 F (36.9 C) (Axillary)  Resp 19  Ht 5\' 10"  (1.778 m)  Wt 110.5 kg (243 lb 9.7 oz)  BMI 34.95 kg/m2  SpO2 98%  Intake/Output Summary (Last 24 hours) at 07/13/15 0752 Last data filed at 07/13/15 0317  Gross per 24 hour  Intake 1447.13 ml  Output    772 ml  Net 675.13 ml   Weight change: -0.3 kg (-10.6 oz) [id number] and alert CVS: irreg,irreg Resp: Clear Abd:+ BS NTND Ext: tr edema NEURO: CNI Ox3 no asterixis Rt IJ Permcath   . antiseptic oral rinse  7 mL Mouth Rinse BID  . calcitRIOL  0.25 mcg Per Tube Q48H  . darbepoetin (ARANESP) injection - DIALYSIS  200 mcg Intravenous Q Tue-HD  . heparin  40 Units/kg Dialysis Once in dialysis  . insulin aspart  0-15 Units Subcutaneous TID WC  . insulin aspart  0-5 Units Subcutaneous QHS  . insulin NPH Human  13 Units Subcutaneous BID AC & HS  . midodrine  10 mg Oral TID AC  . multivitamin  1 tablet Oral QHS  . predniSONE  10 mg Oral BID WC  . tamsulosin  0.4 mg Oral Daily  . Warfarin - Pharmacist Dosing Inpatient   Does not apply q1800   No results found. BMET    Component Value Date/Time   NA 131* 07/13/2015 0415   K 3.8 07/13/2015 0415   CL 93* 07/13/2015 0415   CO2 25 07/13/2015 0415   GLUCOSE 82 07/13/2015 0415   BUN 21* 07/13/2015 0415   CREATININE 2.78* 07/13/2015 0415   CALCIUM 7.8* 07/13/2015 0415   GFRNONAA 21* 07/13/2015 0415   GFRAA 24* 07/13/2015 0415   CBC    Component Value Date/Time   WBC 8.2 07/13/2015 0415   RBC 3.11* 07/13/2015 0415   RBC 2.67* 04/04/2013 0013   HGB 8.9* 07/13/2015 0415   HCT 29.5* 07/13/2015 0415   PLT 199 07/13/2015 0415   MCV 94.9 07/13/2015 0415   MCH 28.6 07/13/2015 0415   MCHC 30.2 07/13/2015 0415   RDW 20.9* 07/13/2015 0415   LYMPHSABS 1.4 07/11/2015 0411   MONOABS 0.6 07/11/2015 0411   EOSABS 0.0 07/11/2015 0411   BASOSABS 0.0 07/11/2015 0411     Assessment: 1.  Acute on CKD 3 Sp HD yest and UO less.   Good chance he may be at ESRD 2. Klebsiella bacteremia 3.  Sec HPTH on rocaltrol 4. Anemia on aranesp 5. Hypotension, on midodrine  Plan: 1.  Plan HD tomorrow  [name] T

## 2015-07-13 NOTE — Progress Notes (Signed)
ANTICOAGULATION CONSULT NOTE - Follow Up Consult  Pharmacy Consult for Coumadin Indication: hx afib/DVT/PE and acute DVT  No Known Allergies  Patient Measurements: Height: 5\' 10"  (177.8 cm) Weight: 243 lb 9.7 oz (110.5 kg) IBW/kg (Calculated) : 73   Vital Signs: Temp: 97.7 F (36.5 C) (06/07 0849) Temp Source: Axillary (06/07 0849) BP: 102/66 mmHg (06/07 0821) Pulse Rate: 78 (06/07 0821)  Labs:  Recent Labs  07/11/15 0411 07/12/15 0414 07/13/15 0415  HGB 8.6* 8.6* 8.9*  HCT 29.0* 28.6* 29.5*  PLT 258 218 199  LABPROT 20.2* 23.5* 23.4*  INR 1.73* 2.11* 2.10*  HEPARINUNFRC 0.63 0.61  --   CREATININE 3.92* 4.43* 2.78*    Estimated Creatinine Clearance: 29.5 mL/min (by C-G formula based on Cr of 2.78).  Assessment: 74yom on coumadin pta for hx afib, DVT/PE, admitted with hemorrhagic shock, coumadin held, and INR reversed. He was found to have an acute DVT in his right leg on 5/11. Hemorrhagic shock resolved and he was started on IV heparin 5/18. Coumadin resumed 5/31. Heparin dc'd 6/6. Today's INR is just therapeutic at 2.1. Hgb low but stable. Hematuria persists per RN. PO intake is pretty good but variable (20-100%)  Home PTA dose: 6mg  MWF, 5mg  all other days  Goal of Therapy:  INR 2-3 Monitor platelets by anticoagulation protocol: Yes   Plan:  Give warfarin 7.5 mg po x 1 Monitor daily INR, CBC, clinical course, s/sx of bleed, PO intake, DDI   Thank you for allowing [place] to participate in this patients care. Jens Som, PharmD Pager: [phone]  07/13/2015,11:24 AM

## 2015-07-13 NOTE — Telephone Encounter (Signed)
PT WIFE RETURNING CALL AND CAN BE REACHED @ [phone].Hillery Hunter

## 2015-07-13 NOTE — Telephone Encounter (Signed)
Left message for patient's wife to call back.  

## 2015-07-13 NOTE — Clinical Social Work Note (Signed)
CSW met with patient. Wife at bedside. CSW presented bed offers. Patient and his wife are familiar with [facility] and [facility].  Sarah Boswell, CSW [phone]  

## 2015-07-13 NOTE — Progress Notes (Signed)
[facility] TEAM 1 - Stepdown/ICU TEAM  [name]  [id number] DOB: [birth year] DOA: 05/31/2015 PCP: Melinda Crutch    Brief Narrative:  75 y.o. M w/ Hx Pulmonary emboli 2008, DVT, Afib, HTN, Dysrhythmia, HLD, DM2, Prostate CA s/p radiation 2011, and RCC s/p Lt Nephrectomy who was seen in ED 4/22 for clogged catheter which was irrigated and replaced. He apparently has had hematuria for quite some time which has been attributed to scarring from prior radiation.  On 04/25 he went to have his INR checked and it was 8.  He was hypotensive with SBP 70/44. He sent to the ED.  In the ED he was found to have a possible UTI and despite 4.5L IVF he remained hypotensive. He also had multiple metabolic derangements including Na 124, K 5.0, AGMA, lactate 3, SCr 9.36. PCCM completed admission for septic shock due to urosepsis as well as AKI.  Significant Events: 4/25 admitted by [facility] w/ septic shock due to UTI + ARF  4/26 Renal u/s > s/p Lt nephrectomy 4/27 CT abd/pelvis > Rt perinephric hematoma 4/30 [facility] assumed care  5/02 CT abd/pelvis > increased size of hematoma 5/03 Cardiac arrest w/ severe anemia > coil embolization R kidney by IR; PTX post-CPR 5/08 TTE EF 65-70% 5/16 Reintubated due to airway secretions 5/18 Doppler legs b/l > Acute DVT Rt gastrocnemius vein, age indeterminate DVT Rt femoral vein and popliteal vein > heparin gtt 5/23 TTE Moderate LVH - EF 65-70%. No wall motion abnormalities. LA mildly dilated. No AS or AR.  5/25 LLE Arterial Duplex: No evidence of stenosis or occluded arteries. 5/27- extubated 5/28 off cvvhd 6/4 CorTrak tube removed  Assessment & Plan:  Hemorrhagic Shock / Acute Blood loss Anemia  -secondary to Rt Perinephric Hematoma - hemodynamically stable at this time   Cardiac Arrest 5/3 -felt to be due to severe anemia   Pneumothorax after CPR 5/03 -chest tubes removed 5/14 - no resp distress   Right Gastrocnemius vein Acute DVT / DVT Right femoral &  popliteal veins -warfarin - monitor hemoglobin closely secondary to patient's episode of hemorrhagic shock -Hemoglobin stable  Chronic diastolic CHF -no signif overload at this time [facility] Weights   07/12/15 1308 07/12/15 1717 07/13/15 0417  Weight: 110.7 kg (244 lb 0.8 oz) 110.1 kg (242 lb 11.6 oz) 110.5 kg (243 lb 9.7 oz)    Ischemic Changes Left Foot  -Arterial duplex w/o notable findings  Acute on Chronic Renal Failure now on HD per Nephrology - will need eventual permanent access - appears this is ESRD  H/O Left Nephrectomy for RCC  Hx prostate CA s/p radiation 2011  Chronic Hematuria -pt has been refusing scheduled foley flushes - flush foley PRN as urine appears to have cleared   Dysphagia -Passed swallow evaluation > dysphagia 2 diet nectar thick liquid - good intake presently   Septic Shock due to Klebsiella Pyelo and Bacteremia  -completed 14 day antibiotic course 5/10  DM2  -5/2 A1C 7.8 - CBG controlled   Acute Encephalopathy  -due to above - resolved   Chronic A.fib Rate controlled - on anticoag w/ warfarin    Severe OSA on CPAP confirmed on sleep study 2009  Obesity - Body mass index is 41.22 kg/(m^2).   DVT prophylaxis: warfarin  Code Status: FULL CODE Family Communication:  Disposition Plan: CIR v/s SNF   Consultants:  Nephrology PCCM Vasc Surgery Orthopedics Urology  PCCM  Antimicrobials:  4/28 Rocephin > 5/10  Subjective: Pt denies cp, sob, n/v,  or abdom pain.     Objective: Blood pressure 85/57, pulse 79, temperature 98.2 F (36.8 C), temperature source Oral, resp. rate 24, height 5\' 10"  (1.778 m), weight 110.5 kg (243 lb 9.7 oz), SpO2 99 %.  Intake/Output Summary (Last 24 hours) at 07/13/15 1656 Last data filed at 07/13/15 1417  Gross per 24 hour  Intake   1240 ml  Output    843 ml  Net    397 ml   Filed Weights   07/12/15 1308 07/12/15 1717 07/13/15 0417  Weight: 110.7 kg (244 lb 0.8 oz) 110.1 kg (242 lb 11.6 oz) 110.5  kg (243 lb 9.7 oz)    Examination: General: No acute respiratory distress - alert  Lungs: Clear to auscultation bilaterally - no wheezes or crackles Cardiovascular: Regular rate without murmur gallop or rub  Abdomen: Nontender, overweight, soft, bowel sounds positive, no rebound, no ascites, no appreciable mass Extremities: No significant cyanosis, or clubbing - trace edema bilateral lower extremities persists   CBC:  Recent Labs Lab 07/07/15 0525 07/08/15 0746 07/09/15 0941 07/10/15 1443 07/11/15 0411 07/12/15 0414 07/13/15 0415  WBC 7.7 11.2* 11.4* 15.7* 15.1* 11.2* 8.2  NEUTROABS 6.4 9.5*  --   --  13.1*  --   --   HGB 7.6* 8.5* 8.6* 8.7* 8.6* 8.6* 8.9*  HCT 25.2* 29.2* 29.8* 29.5* 29.0* 28.6* 29.5*  MCV 93.7 97.3 96.4 96.1 95.7 97.3 94.9  PLT 264 270 289 241 258 218 [id number]   Basic Metabolic Panel:  Recent Labs Lab 07/08/15 0522 07/08/15 0746 07/09/15 0400 07/09/15 0941 07/10/15 0529 07/11/15 0411 07/12/15 0414 07/13/15 0415  NA 135  --  133* 134* 132* 134* 131* 131*  K 4.0  --  4.6 4.4 3.7 4.1 4.7 3.8  CL 99*  --  98* 99* 95* 98* 94* 93*  CO2 27  --  25 25 26 25 25 25   GLUCOSE 113*  --  136* 116* 99 110* 106* 82  BUN 27*  --  46* 48* 20 36* 49* 21*  CREATININE 2.75*  --  4.00* 4.24* 2.60* 3.92* 4.43* 2.78*  CALCIUM 8.2*  --  8.3* 8.4* 8.2* 8.1* 8.2* 7.8*  MG  --  2.1  --  2.1  --  1.9  --   --   PHOS 2.7  --  3.5  --  2.5  --  4.7* 3.8   GFR: Estimated Creatinine Clearance: 29.5 mL/min (by C-G formula based on Cr of 2.78).  Liver Function Tests:  Recent Labs Lab 07/09/15 0400 07/10/15 0529 07/11/15 0411 07/12/15 0414 07/13/15 0415  AST  --   --  42*  --   --   ALT  --   --  62  --   --   ALKPHOS  --   --  96  --   --   BILITOT  --   --  0.9  --   --   PROT  --   --  5.3*  --   --   ALBUMIN 2.0* 2.2* 2.1* 2.2* 2.0*    Coagulation Profile:  Recent Labs Lab 07/09/15 0400 07/10/15 0529 07/11/15 0411 07/12/15 0414 07/13/15 0415  INR 1.37  1.58* 1.73* 2.11* 2.10*    HbA1C: HGB A1C MFR BLD  Date/Time Value Ref Range Status  06/07/2015 05:15 PM 7.8* 4.8 - 5.6 % Final    Comment:    (NOTE)         Pre-diabetes: 5.7 - 6.4  Diabetes: >6.4         Glycemic control for adults with diabetes: <7.0   04/29/2015 03:45 PM 8.6* 4.8 - 5.6 % Final    Comment:    (NOTE)         Pre-diabetes: 5.7 - 6.4         Diabetes: >6.4         Glycemic control for adults with diabetes: <7.0     CBG:  Recent Labs Lab 07/12/15 1159 07/12/15 1755 07/12/15 2106 07/13/15 0848 07/13/15 1136  GLUCAP 117* 77 161* 89 153*    Recent Results (from the past 240 hour(s))  C difficile quick scan w PCR reflex     Status: None   Collection Time: 07/07/15  8:41 PM  Result Value Ref Range Status   C Diff antigen NEGATIVE NEGATIVE Final   C Diff toxin NEGATIVE NEGATIVE Final   C Diff interpretation Negative for toxigenic C. difficile  Final  MRSA PCR Screening     Status: None   Collection Time: 07/11/15 12:03 PM  Result Value Ref Range Status   MRSA by PCR NEGATIVE NEGATIVE Final    Comment:        The GeneXpert MRSA Assay (FDA approved for NASAL specimens only), is one component of a comprehensive MRSA colonization surveillance program. It is not intended to diagnose MRSA infection nor to guide or monitor treatment for MRSA infections.      Scheduled Meds: . antiseptic oral rinse  7 mL Mouth Rinse BID  . calcitRIOL  0.25 mcg Per Tube Q M,W,F  . darbepoetin (ARANESP) injection - DIALYSIS  200 mcg Intravenous Q Tue-HD  . heparin  40 Units/kg Dialysis Once in dialysis  . insulin aspart  0-15 Units Subcutaneous TID WC  . insulin aspart  0-5 Units Subcutaneous QHS  . insulin NPH Human  13 Units Subcutaneous BID AC & HS  . midodrine  10 mg Oral TID AC  . multivitamin  1 tablet Oral QHS  . predniSONE  10 mg Oral BID WC  . tamsulosin  0.4 mg Oral Daily  . warfarin  7.5 mg Oral ONCE-1800  . Warfarin - Pharmacist Dosing  Inpatient   Does not apply q1800     LOS: 43 days   Time spent: 25 minutes   Cherene Altes, MD [facility] Office  [phone] Pager - Text Page per Amion as per below:  On-Call/Text Page:      [email or web address]      password [email or web address]  If 7PM-7AM, please contact night-coverage [email or web address] Password [facility] 07/13/2015, 4:56 PM

## 2015-07-13 NOTE — Progress Notes (Signed)
Speech Language Pathology Treatment: Dysphagia  Patient Details Name: [name] MRN: [id number] DOB: [birth year] Today's Date: 07/13/2015 Time: [id number] SLP Time Calculation (min) (ACUTE ONLY): 13 min  Assessment / Plan / Recommendation Clinical Impression  Pt with increased sensation during swallowing trials of puree/nectar-thickened liquids with minimal verbal cues provided for dry swallow following 2-3 swallows of puree; swallow appears more timely with liquids and pt feels "stronger" overall; vocal quality continues to be primarily aphonic, but other indicators suggest improvements with swallowing function; would benefit from repeat MBS to determine if upgrade of diet/liquids is obtainable at this time; recommend MBS in a.m. D/t radiology and SLP schedule conflicts this date. Pt and wife in agreement.   HPI HPI: 75 yo male with hemorrhagic shock leading to cardiac arrest, VDRF, AKI from Rt perinephric hematoma. He was intubated 5/03-5/13, 5/16-5/27. He has hx of Lt renal cell carcinoma s/p Lt nephrectomy, prostate cancer s/p XRT, PE in 2008, OSA, HTN, HLD, DM, A fib; pt had MBS indicating need for nectar-thickened liquids/Dysphagia 2 (chopped) with silent aspiration noted with thin liquids      SLP Plan  MBS;New goals to be determined pending instrumental study     Recommendations  Diet recommendations: Dysphagia 2 (fine chop);Nectar-thick liquid Liquids provided via: Cup Medication Administration: Crushed with puree Supervision: Staff to assist with self feeding Compensations: Minimize environmental distractions;Slow rate;Small sips/bites;Multiple dry swallows after each bite/sip Postural Changes and/or Swallow Maneuvers: Seated upright 90 degrees             Oral Care Recommendations: Oral care BID Plan: MBS;New goals to be determined pending instrumental study                     Bryten Maher,PAT, M.S., CCC-SLP 07/13/2015, 10:07 AM

## 2015-07-14 ENCOUNTER — Inpatient Hospital Stay (HOSPITAL_COMMUNITY): Payer: BLUE CROSS/BLUE SHIELD

## 2015-07-14 DIAGNOSIS — J939 Pneumothorax, unspecified: Secondary | ICD-10-CM | POA: Insufficient documentation

## 2015-07-14 LAB — RENAL FUNCTION PANEL
ALBUMIN: 2 g/dL — AB (ref 3.5–5.0)
Anion gap: 10 (ref 5–15)
BUN: 31 mg/dL — ABNORMAL HIGH (ref 6–20)
CHLORIDE: 93 mmol/L — AB (ref 101–111)
CO2: 27 mmol/L (ref 22–32)
CREATININE: 3.56 mg/dL — AB (ref 0.61–1.24)
Calcium: 7.9 mg/dL — ABNORMAL LOW (ref 8.9–10.3)
GFR, EST AFRICAN AMERICAN: 18 mL/min — AB (ref >60)
GFR, EST NON AFRICAN AMERICAN: 16 mL/min — AB (ref >60)
Glucose, Bld: 86 mg/dL (ref 65–99)
PHOSPHORUS: 4.7 mg/dL — AB (ref 2.5–4.6)
Potassium: 4.5 mmol/L (ref 3.5–5.1)
Sodium: 130 mmol/L — ABNORMAL LOW (ref 135–145)

## 2015-07-14 LAB — CBC
HEMATOCRIT: 28.6 % — AB (ref 39.0–52.0)
Hemoglobin: 8.7 g/dL — ABNORMAL LOW (ref 13.0–17.0)
MCH: 28.6 pg (ref 26.0–34.0)
MCHC: 30.4 g/dL (ref 30.0–36.0)
MCV: 94.1 fL (ref 78.0–100.0)
PLATELETS: 165 10*3/uL (ref 150–400)
RBC: 3.04 MIL/uL — AB (ref 4.22–5.81)
RDW: 19.9 % — ABNORMAL HIGH (ref 11.5–15.5)
WBC: 6.4 10*3/uL (ref 4.0–10.5)

## 2015-07-14 LAB — PROTIME-INR
INR: 2.43 — ABNORMAL HIGH (ref 0.00–1.49)
Prothrombin Time: 26.1 seconds — ABNORMAL HIGH (ref 11.6–15.2)

## 2015-07-14 LAB — GLUCOSE, CAPILLARY
GLUCOSE-CAPILLARY: 68 mg/dL (ref 65–99)
GLUCOSE-CAPILLARY: 99 mg/dL (ref 65–99)
Glucose-Capillary: 108 mg/dL — ABNORMAL HIGH (ref 65–99)
Glucose-Capillary: 162 mg/dL — ABNORMAL HIGH (ref 65–99)

## 2015-07-14 MED ORDER — MIDODRINE HCL 5 MG PO TABS
ORAL_TABLET | ORAL | Status: AC
  Filled 2015-07-14: qty 2

## 2015-07-14 MED ORDER — WARFARIN SODIUM 6 MG PO TABS
6.0000 mg | ORAL_TABLET | Freq: Once | ORAL | Status: AC
  Administered 2015-07-14: 6 mg via ORAL
  Filled 2015-07-14: qty 1

## 2015-07-14 NOTE — Progress Notes (Signed)
PROGRESS NOTE    [name]  [id number] DOB: 1940-03-21 DOA: 05/31/2015 PCP: Melinda Crutch   Brief Narrative:  [name] is a 75 y.o. WM PMHx Pulmonary emboli 2008,DVT (deep venous thrombosis), HTN, Dysrhythmia, HLD, DM type II with complication, Prostate CA s/p radiation 2011 and RCC s/p Lt Nephrectomy.   He was seen in ED 4/22 for clogged catheter which was irrigated and replaced. He apparently has had hematuria for quite some time which has been attributed to scarring from prior radiation.  He had been in his USOH until roughly 4/20 when he had decreased appetite. He had not been eating or drinking for the past 5 days. On 04/25 he went to have his INR checked (on coumadin for A.fib and hx DVT / PE) and was found to have INR of 8 and was hypotensive with SBP 70/44. He was subsequently sent to ED for further evaluation.  In ED, he was found to have UTI and despite 4.5L IVF, he remained hypotensive. He also had multiple metabolic derangements including Na 124, K 5.0, AGMA, lactate 3, SCr 9.36. PCCM was therefore consulted for admission of septic shock due to urosepsis as well as AKI.    Assessment & Plan:   Active Problems:   Essential hypertension   Gross hematuria   Atrial fibrillation (HCC)   Septic shock (HCC)   Urinary tract infectious disease   Encounter for central line placement   Acute renal failure (HCC)   Dyspnea   Bacteremia due to Klebsiella pneumoniae   Secondary cardiomyopathy ([place])   Chronic atrial fibrillation ([place])   Uncontrolled type 2 diabetes mellitus with complication (HCC)   Perinephric hematoma   Right flank pain   Acute respiratory failure (HCC)   Acute back pain   Bleeding   Chest tube in place   Acute hypoxemic respiratory failure (HCC)   Acute renal failure (ARF) (HCC)   Acute on chronic renal failure ([place])   Hemorrhagic shock   S/p nephrectomy   Sepsis due to Klebsiella (HCC)   OSA (obstructive sleep apnea)   Chronic diastolic  CHF (congestive heart failure) ([place])   Cardiac arrest (HCC)   Acute deep vein thrombosis (DVT) of distal vein of right lower extremity (HCC)   Traumatic pneumothorax   OSA on CPAP   Pneumothorax   Acute Hypoxic Respiratory Failure  - Post Cardiac Arrest. Resolved  Pneumothorax after CPR 5/03 - chest tubes removed 5/14.  OSA. On CPAP -CPAP per respiratory  Cardiac Arrest  Chronic diastolic CHF -Strict in and out since admission - 1.1 L -Daily weight Filed Weights   07/14/15 0500 07/14/15 0655 07/14/15 1057  Weight: 110.8 kg (244 lb 4.3 oz) 110.1 kg (242 lb 11.6 oz) 109.4 kg (241 lb 2.9 oz)    Hemorrhagic Shock/Acute Blood loss Anemia  -secondary to Rt Perinephric Hematoma.  -Resolved  Recent Labs Lab 07/10/15 1443 07/11/15 0411 07/12/15 0414 07/13/15 0415 07/14/15 0325  HGB 8.7* 8.6* 8.6* 8.9* 8.7*  -Hemoglobin stable  Leukocytosis  - Resolved  Right Gastocnemious vein Acute DVT/DVT Right femoral & popliteal veins -Therapeutic on warfarin  Ischemic Changes Left Foot  -There appear to come warfarin   Acute on Chronic Renal Failure(now HD per nephrology)  Lab Results  Component Value Date   CREATININE 3.56* 07/14/2015   CREATININE 2.78* 07/13/2015   CREATININE 4.43* 07/12/2015  -NOTE; patient has used catheter for 10years, with occasional hematuria -Volume removal per HD -Patient ready for discharge when cleared by Nephrology  H/O  Left Nephrectomy for RCC  Hematuria -Patient and wife understand must continue with anticoagulation secondary to risks of DVT propagating -Flush Foley catheter q 2hr/PRN  Dysphagia -Passed swallow evaluation dysphagia 2 diet nectar thick liquid  Sepsis/positive Klebsiella Bacteremia  - Completed Antibiotic course 5/10.  DM type 2 uncontrolled  With complication -5/2 [id number] -Humulin 13 units BID -Moderate SSI  Acute Encephalopathy  -Resolved  Goals of care -PT/OT recommends SNF -Ambulate patient q shift  with assistance   DVT prophylaxis:  Coumadin Code Status: Full Family Communication: None Disposition Plan: Patient ready for discharge when cleared by Nephrology.  Wife states she would like patient to go to [facility]   Consultants:  Dr. Jeneen Rinks Deterding nephrology Dr.Charles E Fields vascular surgery Dr. Latanya Maudlin orthopedics Dr.Benjamin Ardis Hughs urology Dr Rigoberto Noel.  PC CM    Procedures/Significant Events:  4/26 Renal u/s >> s/p Lt nephrectomy 4/27 CT abd/pelvis >> Rt perinephric hematoma 5/02 CT abd/pelvis >> increased size of hematoma 5/03 Cardiac arrest, anemia >> coil embolization Rt kidney by IR; PTX post-CPR 5/08 Echo >> EF 65 to 70% 5/16 Reintubated due to airway secretions 5/18 Doppler legs b/l >> Acute DVT Rt gastrocnemius vein, age indeterminate DVT Rt femoral vein and popliteal vein 5/18 start heparin gtt 5/22 Port CXR: Support lines and tubes remain in position. Low lung volumes. Partial silhouetting of left hemidiaphragm without obvious opacity. 5/23 TTE: Moderate LVH. EF 65-70%. No wall motion abnormalities. LA mildly dilated. No AS or AR. No MR. RV normal in size & function. No effusion. 5/25 LLE Arterial Duplex: Prelim report. No evidence of stenosis or occluded arteries. Triphasic blood flow throughout left leg. 5/27- extubated 5/28 off cvvhd 6/4 CorTrak tube removed   Cultures 4/25 Blood >> positive Klebsiella pneumoniae 4/27 Blood >> negative 5/16 BAL >> oral flora 5/27 Trach Asp: Negative final 5/27 Blood right AC/hand negative final   Antimicrobials: 4/28 Rocephin >> 5/10   Devices    LINES / TUBES:  Rt IJ HD cath 4/26 - 5/12 ETT 5/3 - 5/13; 5/16 - 5/27 Lt femoral CVL 5/3 - 5/17 Lt chest tube 5/3 - 5/14 Rt chest tube 5/3 - 5/14 Rt IJ tunneled HD cath 5/12 >>  Lt Blair CVL 5/17 >>  NGT 5/8 >> 6/4 PIV x1    Continuous Infusions: . sodium chloride Stopped (07/12/15 1200)     Subjective: 6/8  A/O 4, Laying  comfortably in In chair. States PT to work with him earlier today. Negative SOB, negative abdominal/CVA tenderness, negative leg pain. states was able to take a couple steps yesterday with help.     Objective: Filed Vitals:   07/14/15 1136 07/14/15 1140 07/14/15 1428 07/14/15 1621  BP: 97/69     Pulse:      Temp:  98.3 F (36.8 C) 99.1 F (37.3 C) 98.5 F (36.9 C)  TempSrc:  Oral Axillary Oral  Resp:      Height:      Weight:      SpO2:        Intake/Output Summary (Last 24 hours) at 07/14/15 1808 Last data filed at 07/14/15 1500  Gross per 24 hour  Intake    600 ml  Output    632 ml  Net    -32 ml   Filed Weights   07/14/15 0500 07/14/15 0655 07/14/15 1057  Weight: 110.8 kg (244 lb 4.3 oz) 110.1 kg (242 lb 11.6 oz) 109.4 kg (241 lb 2.9 oz)  Examination:  General:   A/O 4, Laying in Chair comfortably.No acute respiratory distress Eyes: negative scleral hemorrhage, negative anisocoria, negative icterus ENT: Negative Runny nose, negative gingival bleeding,  Neck:  Negative scars, masses, torticollis, lymphadenopathy, JVD Lungs: Clear to auscultation bilaterally without wheezes or crackles. Right chest wall HD cath present, negative sign of infection Cardiovascular: Regular rate and rhythm without murmur gallop or rub normal S1 and S2 Abdomen: negative abdominal pain, nondistended, positive soft, bowel sounds, no rebound, no ascites, no appreciable mass Extremities: No significant cyanosis, clubbing, or edema bilateral lower extremities(TED hose present) Skin: Negative rashes, lesions, ulcers Psychiatric:  Negative depression, negative anxiety, negative fatigue, negative mania  Central nervous system:  Cranial nerves II through XII intact, tongue/uvula midline, all extremities muscle strength 5/5, sensation intact throughout,  negative dysarthria, negative expressive aphasia, negative receptive aphasia. .     Data Reviewed: Care during the described time interval was  provided by me .  I have reviewed this patient's available data, including medical history, events of note, physical examination, and all test results as part of my evaluation. I have personally reviewed and interpreted all radiology studies.  CBC:  Recent Labs Lab 07/08/15 0746  07/10/15 1443 07/11/15 0411 07/12/15 0414 07/13/15 0415 07/14/15 0325  WBC 11.2*  < > 15.7* 15.1* 11.2* 8.2 6.4  NEUTROABS 9.5*  --   --  13.1*  --   --   --   HGB 8.5*  < > 8.7* 8.6* 8.6* 8.9* 8.7*  HCT 29.2*  < > 29.5* 29.0* 28.6* 29.5* 28.6*  MCV 97.3  < > 96.1 95.7 97.3 94.9 94.1  PLT 270  < > 241 258 218 199 165  < > = values in this interval not displayed. Basic Metabolic Panel:  Recent Labs Lab 07/08/15 0746 07/09/15 0400 07/09/15 0941 07/10/15 0529 07/11/15 0411 07/12/15 0414 07/13/15 0415 07/14/15 0325  NA  --  133* 134* 132* 134* 131* 131* 130*  K  --  4.6 4.4 3.7 4.1 4.7 3.8 4.5  CL  --  98* 99* 95* 98* 94* 93* 93*  CO2  --  25 25 26 25 25 25 27   GLUCOSE  --  136* 116* 99 110* 106* 82 86  BUN  --  46* 48* 20 36* 49* 21* 31*  CREATININE  --  4.00* 4.24* 2.60* 3.92* 4.43* 2.78* 3.56*  CALCIUM  --  8.3* 8.4* 8.2* 8.1* 8.2* 7.8* 7.9*  MG 2.1  --  2.1  --  1.9  --   --   --   PHOS  --  3.5  --  2.5  --  4.7* 3.8 4.7*   GFR: Estimated Creatinine Clearance: 22.9 mL/min (by C-G formula based on Cr of 3.56). Liver Function Tests:  Recent Labs Lab 07/10/15 0529 07/11/15 0411 07/12/15 0414 07/13/15 0415 07/14/15 0325  AST  --  42*  --   --   --   ALT  --  62  --   --   --   ALKPHOS  --  96  --   --   --   BILITOT  --  0.9  --   --   --   PROT  --  5.3*  --   --   --   ALBUMIN 2.2* 2.1* 2.2* 2.0* 2.0*   No results for input(s): LIPASE, AMYLASE in the last 168 hours. No results for input(s): AMMONIA in the last 168 hours. Coagulation Profile:  Recent Labs Lab 07/10/15  [id number] 07/11/15 0411 07/12/15 0414 07/13/15 0415 07/14/15 0325  INR 1.58* 1.73* 2.11* 2.10* 2.43*    Cardiac Enzymes: No results for input(s): CKTOTAL, CKMB, CKMBINDEX, TROPONINI in the last 168 hours. BNP (last 3 results) No results for input(s): PROBNP in the last 8760 hours. HbA1C: No results for input(s): HGBA1C in the last 72 hours. CBG:  Recent Labs Lab 07/13/15 1657 07/13/15 2121 07/14/15 1143 07/14/15 1425 07/14/15 1742  GLUCAP 122* 175* 68 99 108*   Lipid Profile: No results for input(s): CHOL, HDL, LDLCALC, TRIG, CHOLHDL, LDLDIRECT in the last 72 hours. Thyroid Function Tests: No results for input(s): TSH, T4TOTAL, FREET4, T3FREE, THYROIDAB in the last 72 hours. Anemia Panel: No results for input(s): VITAMINB12, FOLATE, FERRITIN, TIBC, IRON, RETICCTPCT in the last 72 hours. Urine analysis:    Component Value Date/Time   COLORURINE RED* 05/31/2015 1158   APPEARANCEUR TURBID* 05/31/2015 1158   LABSPEC 1.021 05/31/2015 1158   PHURINE 7.5 05/31/2015 1158   GLUCOSEU NEGATIVE 05/31/2015 1158   HGBUR LARGE* 05/31/2015 1158   BILIRUBINUR LARGE* 05/31/2015 1158   KETONESUR 40* 05/31/2015 1158   PROTEINUR >300* 05/31/2015 1158   UROBILINOGEN 0.2 03/06/2013 1748   NITRITE POSITIVE* 05/31/2015 1158   LEUKOCYTESUR LARGE* 05/31/2015 1158   Sepsis Labs: @LABRCNTIP (procalcitonin:4,lacticidven:4)  ) Recent Results (from the past 240 hour(s))  C difficile quick scan w PCR reflex     Status: None   Collection Time: 07/07/15  8:41 PM  Result Value Ref Range Status   C Diff antigen NEGATIVE NEGATIVE Final   C Diff toxin NEGATIVE NEGATIVE Final   C Diff interpretation Negative for toxigenic C. difficile  Final  MRSA PCR Screening     Status: None   Collection Time: 07/11/15 12:03 PM  Result Value Ref Range Status   MRSA by PCR NEGATIVE NEGATIVE Final    Comment:        The GeneXpert MRSA Assay (FDA approved for NASAL specimens only), is one component of a comprehensive MRSA colonization surveillance program. It is not intended to diagnose MRSA infection nor to  guide or monitor treatment for MRSA infections.          Radiology Studies: Dg Swallowing Func-speech Pathology  07/14/2015  Objective Swallowing Evaluation: Type of Study: MBS-Modified Barium Swallow Study Patient Details Name: CHAYNE BLEAU MRN: [id number] Date of Birth: 12-03-1940 Today's Date: 07/14/2015 Time: SLP Start Time (ACUTE ONLY): 1341-SLP Stop Time (ACUTE ONLY): 1350 SLP Time Calculation (min) (ACUTE ONLY): 9 min Past Medical History: Past Medical History Diagnosis Date . Obesity  . Phlebitis    Lower extermity . Pulmonary emboli ([place]) 2008   submassive, saddle . Prostate cancer ([place]) 07/2009 . Sleep apnea    on CPAP . Hx of echocardiogram 12/04/2010   Normal EF >55% no significant valve disease . History of stress test 06/27/2009   Low risk and EF of approximately 50% . DVT (deep venous thrombosis) ([place])  . Chronic kidney disease, stage 3    baseline creatinine ~1.4 . HLD (hyperlipidemia)  . HTN (hypertension)  . Dysrhythmia    A fib . Diabetes mellitus ([place])    diet controlled . History of hiatal hernia  Past Surgical History: Past Surgical History Procedure Laterality Date . Nephrectomy  1999   for CA . Cholecystectomy  1999 . Transurethral resection of bladder tumor N/A 03/04/2013   Procedure: CYSTOSCOPY WITH RIGHT RETROGRADE PYELOGRAM AND BLADDER BIOPSY /CLOT EVACUATION/ BIOPSY PROSTATIC URETHRA WITH FULGERATION ;  Surgeon: Molli Hazard, MD;  Location: [facility];  Service: Urology;  Laterality: N/A; . Cystoscopy w/ retrogrades N/A 04/08/2013   Procedure: CYSTOSCOPY WITH CLOT EVACUATION;  Surgeon: Molli Hazard, MD;  Location: [facility];  Service: Urology;  Laterality: N/A; . Left shoulder repair   . Shoulder open rotator cuff repair Right 05/03/2015   Procedure: RIGHT SHOULDER ROTATOR CUFF REPAIR OPEN WITH GRAFT AND ANCHOR ;  Surgeon: Latanya Maudlin, MD;  Location: [facility];  Service: Orthopedics;  Laterality: Right; HPI: 75 yo male with hemorrhagic shock leading to cardiac arrest,  VDRF, AKI from Rt perinephric hematoma. He was intubated 5/03-5/13, 5/16-5/27. He has hx of Lt renal cell carcinoma s/p Lt nephrectomy, prostate cancer s/p XRT, PE in 2008, OSA, HTN, HLD, DM, A fib. Subjective: pt alert, pleasant Assessment / Plan / Recommendation CHL IP CLINICAL IMPRESSIONS 07/14/2015 Therapy Diagnosis Mild pharyngeal phase dysphagia;Moderate pharyngeal phase dysphagia Clinical Impression Pt shows improvements in his mild-moderate oropharyngeal dysphagia since previous MBS. Oral phase overall is strong and effective, although he does have piecemeal swallowing particularly with liquids. He continues to have a brief delay in swallow trigger but now with most boluses being contained within the valleculae prior to initiation. The exception is when he consumes thin liquids via straw, as they reach the pyriform sinuses and are subsequently aspirated before the swallow. Cued coughing is productive of secretions mixed with barium, although it is unclear if all aspirates were expelled. A mild-moderate amount of residue still remains with solid textures. Recommend to continue Dys 2 diet but with advancement to thin liquids by cup. Anticipate further improvements as overall strength continues to progress. Impact on safety and function Mild aspiration risk   CHL IP TREATMENT RECOMMENDATION 07/14/2015 Treatment Recommendations Therapy as outlined in treatment plan below   Prognosis 07/14/2015 Prognosis for Safe Diet Advancement Good Barriers to Reach Goals -- Barriers/Prognosis Comment -- CHL IP DIET RECOMMENDATION 07/14/2015 SLP Diet Recommendations Dysphagia 2 (Fine chop) solids;Thin liquid Liquid Administration via Cup;No straw Medication Administration Whole meds with puree Compensations Slow rate;Small sips/bites;Multiple dry swallows after each bite/sip Postural Changes Remain semi-upright after after feeds/meals (Comment);Seated upright at 90 degrees   CHL IP OTHER RECOMMENDATIONS 07/14/2015 Recommended Consults  -- Oral Care Recommendations Oral care BID Other Recommendations Order thickener from pharmacy;Prohibited food (jello, ice cream, thin soups);Remove water pitcher   CHL IP FOLLOW UP RECOMMENDATIONS 07/14/2015 Follow up Recommendations LTACH   CHL IP FREQUENCY AND DURATION 07/14/2015 Speech Therapy Frequency (ACUTE ONLY) min 2x/week Treatment Duration 2 weeks      CHL IP ORAL PHASE 07/14/2015 Oral Phase Impaired Oral - Pudding Teaspoon -- Oral - Pudding Cup -- Oral - Honey Teaspoon -- Oral - Honey Cup -- Oral - Nectar Teaspoon NT Oral - Nectar Cup NT Oral - Nectar Straw NT Oral - Thin Teaspoon NT Oral - Thin Cup Piecemeal swallowing Oral - Thin Straw Piecemeal swallowing Oral - Puree WFL Oral - Mech Soft WFL Oral - Regular -- Oral - Multi-Consistency -- Oral - Pill -- Oral Phase - Comment --  CHL IP PHARYNGEAL PHASE 07/14/2015 Pharyngeal Phase Impaired Pharyngeal- Pudding Teaspoon -- Pharyngeal -- Pharyngeal- Pudding Cup -- Pharyngeal -- Pharyngeal- Honey Teaspoon -- Pharyngeal -- Pharyngeal- Honey Cup -- Pharyngeal -- Pharyngeal- Nectar Teaspoon NT Pharyngeal -- Pharyngeal- Nectar Cup NT Pharyngeal -- Pharyngeal- Nectar Straw NT Pharyngeal -- Pharyngeal- Thin Teaspoon NT Pharyngeal -- Pharyngeal- Thin Cup Delayed swallow initiation-vallecula;Reduced tongue base retraction Pharyngeal Material does not enter airway Pharyngeal- Thin Straw Delayed swallow initiation-pyriform sinuses;Penetration/Aspiration before swallow;Reduced tongue  base retraction Pharyngeal Material enters airway, passes BELOW cords without attempt by patient to eject out (silent aspiration) Pharyngeal- Puree Delayed swallow initiation-vallecula;Reduced tongue base retraction;Pharyngeal residue - valleculae Pharyngeal -- Pharyngeal- Mechanical Soft Delayed swallow initiation-vallecula;Reduced tongue base retraction;Pharyngeal residue - valleculae Pharyngeal -- Pharyngeal- Regular -- Pharyngeal -- Pharyngeal- Multi-consistency -- Pharyngeal -- Pharyngeal-  Pill -- Pharyngeal -- Pharyngeal Comment --  CHL IP CERVICAL ESOPHAGEAL PHASE 07/14/2015 Cervical Esophageal Phase WFL Pudding Teaspoon -- Pudding Cup -- Honey Teaspoon -- Honey Cup -- Nectar Teaspoon -- Nectar Cup -- Nectar Straw -- Thin Teaspoon -- Thin Cup -- Thin Straw -- Puree -- Mechanical Soft -- Regular -- Multi-consistency -- Pill -- Cervical Esophageal Comment -- No flowsheet data found. Germain Osgood, M.A. CCC-SLP [phone] Germain Osgood 07/14/2015, 2:47 PM                   Scheduled Meds: . antiseptic oral rinse  7 mL Mouth Rinse BID  . [START ON 07/15/2015] calcitRIOL  0.25 mcg Oral Q M,W,F  . darbepoetin (ARANESP) injection - DIALYSIS  200 mcg Intravenous Q Tue-HD  . heparin  40 Units/kg Dialysis Once in dialysis  . insulin aspart  0-15 Units Subcutaneous TID WC  . insulin aspart  0-5 Units Subcutaneous QHS  . insulin NPH Human  13 Units Subcutaneous BID AC & HS  . midodrine  10 mg Oral TID AC  . multivitamin  1 tablet Oral QHS  . predniSONE  10 mg Oral Q breakfast  . tamsulosin  0.4 mg Oral Daily  . Warfarin - Pharmacist Dosing Inpatient   Does not apply q1800   Continuous Infusions: . sodium chloride Stopped (07/12/15 1200)     LOS: 44 days    Time spent: 40 minutes    Tyreisha Ungar, Geraldo Docker, MD [facility] Pager [phone]   If 7PM-7AM, please contact night-coverage [email or web address] Password [facility] 07/14/2015, 6:08 PM

## 2015-07-14 NOTE — Procedures (Signed)
Pt seen on HD.  Ap 150 Vp 140. BFR 400.  SBP 84.  Eating better.  Sat in chair for most the day yest.

## 2015-07-14 NOTE — Progress Notes (Signed)
ANTICOAGULATION CONSULT NOTE - Follow Up Consult  Pharmacy Consult for Coumadin Indication: hx afib/DVT/PE and acute DVT  No Known Allergies  Patient Measurements: Height: 5\' 10"  (177.8 cm) Weight: 241 lb 2.9 oz (109.4 kg) IBW/kg (Calculated) : 73   Vital Signs: Temp: 98.3 F (36.8 C) (06/08 1140) Temp Source: Oral (06/08 1140) BP: 97/69 mmHg (06/08 1136) Pulse Rate: 74 (06/08 1057)  Labs:  Recent Labs  07/12/15 0414 07/13/15 0415 07/14/15 0325  HGB 8.6* 8.9* 8.7*  HCT 28.6* 29.5* 28.6*  PLT 218 199 165  LABPROT 23.5* 23.4* 26.1*  INR 2.11* 2.10* 2.43*  HEPARINUNFRC 0.61  --   --   CREATININE 4.43* 2.78* 3.56*    Estimated Creatinine Clearance: 22.9 mL/min (by C-G formula based on Cr of 3.56).  Assessment: 74yom on coumadin pta for hx afib, DVT/PE, admitted with hemorrhagic shock, coumadin held, and INR reversed. He was found to have an acute DVT in his right leg on 5/11. Hemorrhagic shock resolved and he was started on IV heparin 5/18. Coumadin resumed 5/31. Heparin dc'd 6/6.  Today's INR is therapeutic at 2.43. Hgb low but stable. Hematuria has cleared per RN. PO intake is fair but variable (20-100%)  Home PTA dose: 6mg  MWF, 5mg  all other days  Goal of Therapy:  INR 2-3 Monitor platelets by anticoagulation protocol: Yes   Plan:  Give warfarin 6 mg po x 1 Monitor daily INR, CBC, clinical course, s/sx of bleed, PO intake, DDI   Thank you for allowing [place] to participate in this patients care. Jens Som, PharmD Pager: [phone]  07/14/2015,12:22 PM

## 2015-07-14 NOTE — Clinical Social Work Note (Signed)
CSW spoke with [facility] admissions coordinator. She does not believe patient will be able to meet PT intensity. CSW contacted patient's wife to discuss this. She and patient will discuss the three SNF options. Patient's wife may also tour them. Discussed need to start insurance authorization for [employer].  [name], [place]

## 2015-07-14 NOTE — Clinical Social Work Note (Signed)
Patient and his wife have accepted bed offer at [facility]. Facility notified and will start auth today. It could take up to 2 days to obtain but they can stay in touch with [employer] to stay up to date on discharge date if extended. Unknown discharge date due to need of surgery on arm related to HD. CSW paged MD to inquire about anticipated dc date.  [name], [place]

## 2015-07-14 NOTE — Progress Notes (Signed)
Discussed with SW, Clarise Cruz. Pt currently not at a level to tolerate 3 hrs per day of therapy as demonstrated by his current therapy session on 07/13/15. If MD would like a formal rehab consult please order. BCBS would have to approve any rehab venue. [id number]

## 2015-07-14 NOTE — Progress Notes (Signed)
MBSS complete. Full report located under chart review in imaging section.  Cori Henningsen Paiewonsky, M.A. CCC-SLP [phone]  

## 2015-07-14 NOTE — Telephone Encounter (Signed)
LM for patient wife [name] x 1

## 2015-07-15 LAB — CBC
HCT: 30.7 % — ABNORMAL LOW (ref 39.0–52.0)
Hemoglobin: 9.1 g/dL — ABNORMAL LOW (ref 13.0–17.0)
MCH: 29.1 pg (ref 26.0–34.0)
MCHC: 29.6 g/dL — AB (ref 30.0–36.0)
MCV: 98.1 fL (ref 78.0–100.0)
Platelets: 153 10*3/uL (ref 150–400)
RBC: 3.13 MIL/uL — ABNORMAL LOW (ref 4.22–5.81)
RDW: 20.2 % — ABNORMAL HIGH (ref 11.5–15.5)
WBC: 7.3 10*3/uL (ref 4.0–10.5)

## 2015-07-15 LAB — RENAL FUNCTION PANEL
ANION GAP: 9 (ref 5–15)
Albumin: 1.9 g/dL — ABNORMAL LOW (ref 3.5–5.0)
BUN: 16 mg/dL (ref 6–20)
CO2: 27 mmol/L (ref 22–32)
Calcium: 7.9 mg/dL — ABNORMAL LOW (ref 8.9–10.3)
Chloride: 100 mmol/L — ABNORMAL LOW (ref 101–111)
Creatinine, Ser: 2.75 mg/dL — ABNORMAL HIGH (ref 0.61–1.24)
GFR calc Af Amer: 25 mL/min — ABNORMAL LOW (ref >60)
GFR calc non Af Amer: 21 mL/min — ABNORMAL LOW (ref >60)
GLUCOSE: 113 mg/dL — AB (ref 65–99)
PHOSPHORUS: 3.9 mg/dL (ref 2.5–4.6)
POTASSIUM: 4.2 mmol/L (ref 3.5–5.1)
Sodium: 136 mmol/L (ref 135–145)

## 2015-07-15 LAB — GLUCOSE, CAPILLARY
GLUCOSE-CAPILLARY: 249 mg/dL — AB (ref 65–99)
GLUCOSE-CAPILLARY: 157 mg/dL — AB (ref 65–99)
Glucose-Capillary: 111 mg/dL — ABNORMAL HIGH (ref 65–99)
Glucose-Capillary: 185 mg/dL — ABNORMAL HIGH (ref 65–99)

## 2015-07-15 LAB — PROTIME-INR
INR: 2.88 — ABNORMAL HIGH (ref 0.00–1.49)
Prothrombin Time: 29.7 seconds — ABNORMAL HIGH (ref 11.6–15.2)

## 2015-07-15 MED ORDER — WARFARIN SODIUM 6 MG PO TABS
6.0000 mg | ORAL_TABLET | Freq: Once | ORAL | Status: AC
  Administered 2015-07-15: 6 mg via ORAL
  Filled 2015-07-15: qty 1

## 2015-07-15 NOTE — Progress Notes (Signed)
[place] TEAM 1 - Stepdown/ICU TEAM  YOAV SOCARRAS  [name] DOB: [birth year] DOA: 05/31/2015 PCP: Melinda Crutch    Brief Narrative:  75 y.o. M w/ Hx Pulmonary emboli 2008, DVT, Afib, HTN, Dysrhythmia, HLD, DM2, Prostate CA s/p radiation 2011, and RCC s/p Lt Nephrectomy who was seen in ED 4/22 for clogged catheter which was irrigated and replaced. He apparently has had hematuria for quite some time which has been attributed to scarring from prior radiation.  On 04/25 he went to have his INR checked and it was 8.  He was hypotensive with SBP 70/44. He sent to the ED.  In the ED he was found to have a possible UTI and despite 4.5L IVF he remained hypotensive. He also had multiple metabolic derangements including Na 124, K 5.0, AGMA, lactate 3, SCr 9.36. PCCM completed admission for septic shock due to urosepsis as well as AKI.  Significant Events: 4/25 admitted by [facility] w/ septic shock due to UTI + ARF  4/26 Renal u/s > s/p Lt nephrectomy 4/27 CT abd/pelvis > Rt perinephric hematoma 4/30 [facility] assumed care  5/02 CT abd/pelvis > increased size of hematoma 5/03 Cardiac arrest w/ severe anemia > coil embolization R kidney by IR; PTX post-CPR 5/08 TTE EF 65-70% 5/16 Reintubated due to airway secretions 5/18 Doppler legs b/l > Acute DVT Rt gastrocnemius vein, age indeterminate DVT Rt femoral vein and popliteal vein > heparin gtt 5/23 TTE Moderate LVH - EF 65-70%. No wall motion abnormalities. LA mildly dilated. No AS or AR.  5/25 LLE Arterial Duplex: No evidence of stenosis or occluded arteries. 5/27- extubated 5/28 off cvvhd 6/4 CorTrak tube removed  Subjective: The patient is resting comfortably in bed.  He does report a low-grade temperature elevation today and a generalized feeling of lethargy.  He denies chest pain shortness of breath nausea vomiting or abdominal pain.   Assessment & Plan:  Acute on Chronic Renal Failure > now ESRD now on HD per Nephrology - will need eventual  permanent access but timing is not clear - has a tunneled HD cath in L chest - appears this is ESRD  Hemorrhagic Shock / Acute Blood loss Anemia  -secondary to Rt Perinephric Hematoma - hemodynamically stable at this time   Cardiac Arrest 5/3 -felt to be due to severe anemia   Pneumothorax after CPR 5/03 -chest tubes removed 5/14 - no resp distress   Right Gastrocnemius vein Acute DVT / DVT Right femoral & popliteal veins -warfarin - monitor hemoglobin closely secondary to patient's episode of hemorrhagic shock -Hemoglobin stable - INR at goal   Chronic diastolic CHF -no signif overload at this time [facility] Weights   07/14/15 0655 07/14/15 1057 07/15/15 0355  Weight: 110.1 kg (242 lb 11.6 oz) 109.4 kg (241 lb 2.9 oz) 111.721 kg (246 lb 4.8 oz)    Ischemic Changes Left Foot  -Arterial duplex w/o notable findings  H/O Left Nephrectomy for RCC  Hx prostate CA s/p radiation 2011  Chronic Hematuria -pt has been refusing scheduled foley flushes - flush foley PRN as urine appears to have cleared   Dysphagia -Passed swallow evaluation - has now been graduated to thin liquids / D2  Septic Shock due to Klebsiella Pyelo and Bacteremia  -completed 14 day antibiotic course 5/10  DM2  -5/2 A1C 7.8 - CBG controlled   Acute Encephalopathy  -due to above - resolved   Chronic A.fib Rate controlled - on anticoag w/ warfarin    Severe OSA on CPAP  confirmed on sleep study 2009  Obesity - Body mass index is 41.22 kg/(m^2).   DVT prophylaxis: warfarin  Code Status: FULL CODE Family Communication:  Disposition Plan: plan is for eventual d/c to SNF for a rehab stay - need to watch current low grade temp to assure not indicative of occult infection - need to clarify if Vasc Surgery to place permanent HD access prior to d/c - need to assure outpt HD arrangements completed - anticipate probable d/c early next week if all goes well  Consultants:  Nephrology PCCM Vasc  Surgery Orthopedics Urology  PCCM  Antimicrobials:  4/28 Rocephin > 5/10  Objective: Blood pressure 104/72, pulse 80, temperature 99.9 F (37.7 C), temperature source Oral, resp. rate 18, height 5\' 10"  (1.778 m), weight 111.721 kg (246 lb 4.8 oz), SpO2 97 %.  Intake/Output Summary (Last 24 hours) at 07/15/15 1052 Last data filed at 07/15/15 0408  Gross per 24 hour  Intake    480 ml  Output    582 ml  Net   -102 ml   Filed Weights   07/14/15 0655 07/14/15 1057 07/15/15 0355  Weight: 110.1 kg (242 lb 11.6 oz) 109.4 kg (241 lb 2.9 oz) 111.721 kg (246 lb 4.8 oz)    Examination: General: No acute respiratory distress - alert and conversant  Lungs: Clear to auscultation bilaterally - no wheezes  Cardiovascular: Regular rate without murmur  Abdomen: Nontender, overweight, soft, bowel sounds positive, no rebound Extremities: No significant cyanosis, or clubbing - trace edema bilateral lower extremities   CBC:  Recent Labs Lab 07/11/15 0411 07/12/15 0414 07/13/15 0415 07/14/15 0325 07/15/15 0353  WBC 15.1* 11.2* 8.2 6.4 7.3  NEUTROABS 13.1*  --   --   --   --   HGB 8.6* 8.6* 8.9* 8.7* 9.1*  HCT 29.0* 28.6* 29.5* 28.6* 30.7*  MCV 95.7 97.3 94.9 94.1 98.1  PLT 258 218 199 165 0000000   Basic Metabolic Panel:  Recent Labs Lab 07/09/15 0941 07/10/15 0529 07/11/15 0411 07/12/15 0414 07/13/15 0415 07/14/15 0325 07/15/15 0353  NA 134* 132* 134* 131* 131* 130* 136  K 4.4 3.7 4.1 4.7 3.8 4.5 4.2  CL 99* 95* 98* 94* 93* 93* 100*  CO2 25 26 25 25 25 27 27   GLUCOSE 116* 99 110* 106* 82 86 113*  BUN 48* 20 36* 49* 21* 31* 16  CREATININE 4.24* 2.60* 3.92* 4.43* 2.78* 3.56* 2.75*  CALCIUM 8.4* 8.2* 8.1* 8.2* 7.8* 7.9* 7.9*  MG 2.1  --  1.9  --   --   --   --   PHOS  --  2.5  --  4.7* 3.8 4.7* 3.9   GFR: Estimated Creatinine Clearance: 29.9 mL/min (by C-G formula based on Cr of 2.75).  Liver Function Tests:  Recent Labs Lab 07/11/15 0411 07/12/15 0414 07/13/15 0415  07/14/15 0325 07/15/15 0353  AST 42*  --   --   --   --   ALT 62  --   --   --   --   ALKPHOS 96  --   --   --   --   BILITOT 0.9  --   --   --   --   PROT 5.3*  --   --   --   --   ALBUMIN 2.1* 2.2* 2.0* 2.0* 1.9*    Coagulation Profile:  Recent Labs Lab 07/11/15 0411 07/12/15 0414 07/13/15 0415 07/14/15 0325 07/15/15 0353  INR 1.73* 2.11* 2.10* 2.43* 2.88*  HbA1C: HGB A1C MFR BLD  Date/Time Value Ref Range Status  06/07/2015 05:15 PM 7.8* 4.8 - 5.6 % Final    Comment:    (NOTE)         Pre-diabetes: 5.7 - 6.4         Diabetes: >6.4         Glycemic control for adults with diabetes: <7.0   04/29/2015 03:45 PM 8.6* 4.8 - 5.6 % Final    Comment:    (NOTE)         Pre-diabetes: 5.7 - 6.4         Diabetes: >6.4         Glycemic control for adults with diabetes: <7.0     CBG:  Recent Labs Lab 07/14/15 1143 07/14/15 1425 07/14/15 1742 07/14/15 2116 07/15/15 0834  GLUCAP 68 99 108* 162* 111*    Recent Results (from the past 240 hour(s))  C difficile quick scan w PCR reflex     Status: None   Collection Time: 07/07/15  8:41 PM  Result Value Ref Range Status   C Diff antigen NEGATIVE NEGATIVE Final   C Diff toxin NEGATIVE NEGATIVE Final   C Diff interpretation Negative for toxigenic C. difficile  Final  MRSA PCR Screening     Status: None   Collection Time: 07/11/15 12:03 PM  Result Value Ref Range Status   MRSA by PCR NEGATIVE NEGATIVE Final    Comment:        The GeneXpert MRSA Assay (FDA approved for NASAL specimens only), is one component of a comprehensive MRSA colonization surveillance program. It is not intended to diagnose MRSA infection nor to guide or monitor treatment for MRSA infections.      Scheduled Meds: . antiseptic oral rinse  7 mL Mouth Rinse BID  . calcitRIOL  0.25 mcg Oral Q M,W,F  . darbepoetin (ARANESP) injection - DIALYSIS  200 mcg Intravenous Q Tue-HD  . heparin  40 Units/kg Dialysis Once in dialysis  . insulin  aspart  0-15 Units Subcutaneous TID WC  . insulin aspart  0-5 Units Subcutaneous QHS  . insulin NPH Human  13 Units Subcutaneous BID AC & HS  . midodrine  10 mg Oral TID AC  . multivitamin  1 tablet Oral QHS  . predniSONE  10 mg Oral Q breakfast  . tamsulosin  0.4 mg Oral Daily  . warfarin  6 mg Oral ONCE-1800  . Warfarin - Pharmacist Dosing Inpatient   Does not apply q1800     LOS: 45 days   Time spent: 25 minutes   Cherene Altes, MD [facility] Office  [phone] Pager - Text Page per Amion as per below:  On-Call/Text Page:      [email or web address]      password [email or web address]  If 7PM-7AM, please contact night-coverage [email or web address] Password [email or web address] 07/15/2015, 10:52 AM

## 2015-07-15 NOTE — Telephone Encounter (Signed)
Patient wife called back with claim number: [id number]. She can be reached at [phone] with questions -prm

## 2015-07-15 NOTE — Telephone Encounter (Signed)
Spoke with pt's wife.  Reports [employer] faxed back disability forms to be completed.  Reports these forms were filled out by Dr. Elsworth Soho and Dr. Halford Chessman on 06/28/15 but are "incomplete."   I see these forms scanned into chart.  Wife not sure exactly what needs to be provided to complete paperwork.  We can call [phone].   [place] with [employer].  Was advised we would need with full SSN or claim # for further information.    Spoke with pt's wife.  She does not have a claim #.  She will call insurance co form this and will call us back with this number.

## 2015-07-15 NOTE — Clinical Social Work Note (Signed)
Insurance authorization obtained. Per MD note, patient will likely discharge early next week.  [name], [place]

## 2015-07-15 NOTE — Progress Notes (Signed)
S: Eating good.  Working with rehab O:BP 107/71 mmHg  Pulse 79  Temp(Src) 99.6 F (37.6 C) (Oral)  Resp 22  Ht 5\' 10"  (1.778 m)  Wt 111.721 kg (246 lb 4.8 oz)  BMI 35.34 kg/m2  SpO2 99%  Intake/Output Summary (Last 24 hours) at 07/15/15 0838 Last data filed at 07/15/15 0408  Gross per 24 hour  Intake    480 ml  Output    582 ml  Net   -102 ml   Weight change: -1.4 kg (-3 lb 1.4 oz) [id number] and alert [facility]: irreg,irreg Resp: Clear Abd:+ BS NTND Ext: tr edema NEURO: CNI Ox3 no asterixis Rt IJ Permcath   . antiseptic oral rinse  7 mL Mouth Rinse BID  . calcitRIOL  0.25 mcg Oral Q M,W,F  . darbepoetin (ARANESP) injection - DIALYSIS  200 mcg Intravenous Q Tue-HD  . heparin  40 Units/kg Dialysis Once in dialysis  . insulin aspart  0-15 Units Subcutaneous TID WC  . insulin aspart  0-5 Units Subcutaneous QHS  . insulin NPH Human  13 Units Subcutaneous BID AC & HS  . midodrine  10 mg Oral TID AC  . multivitamin  1 tablet Oral QHS  . predniSONE  10 mg Oral Q breakfast  . tamsulosin  0.4 mg Oral Daily  . Warfarin - Pharmacist Dosing Inpatient   Does not apply q1800   Dg Swallowing Func-speech Pathology  07/14/2015  Objective Swallowing Evaluation: Type of Study: MBS-Modified Barium Swallow Study Patient Details Name: [name] MRN: [id number] Date of Birth: 02-25-1940 Today's Date: 07/14/2015 Time: SLP Start Time (ACUTE ONLY): 1341-SLP Stop Time (ACUTE ONLY): 1350 SLP Time Calculation (min) (ACUTE ONLY): 9 min Past Medical History: Past Medical History Diagnosis Date . Obesity  . Phlebitis    Lower extermity . Pulmonary emboli ([place]) 2008   submassive, saddle . Prostate cancer ([place]) 07/2009 . Sleep apnea    on CPAP . Hx of echocardiogram 12/04/2010   Normal EF >55% no significant valve disease . History of stress test 06/27/2009   Low risk and EF of approximately 50% . DVT (deep venous thrombosis) ([place])  . Chronic kidney disease, stage 3    baseline creatinine ~1.4 . HLD (hyperlipidemia)   . HTN (hypertension)  . Dysrhythmia    A fib . Diabetes mellitus ([place])    diet controlled . History of hiatal hernia  Past Surgical History: Past Surgical History Procedure Laterality Date . Nephrectomy  1999   for CA . Cholecystectomy  1999 . Transurethral resection of bladder tumor N/A 03/04/2013   Procedure: CYSTOSCOPY WITH RIGHT RETROGRADE PYELOGRAM AND BLADDER BIOPSY /CLOT EVACUATION/ BIOPSY PROSTATIC URETHRA WITH FULGERATION ;  Surgeon: Molli Hazard, MD;  Location: [facility];  Service: Urology;  Laterality: N/A; . Cystoscopy w/ retrogrades N/A 04/08/2013   Procedure: CYSTOSCOPY WITH CLOT EVACUATION;  Surgeon: Molli Hazard, MD;  Location: [facility];  Service: Urology;  Laterality: N/A; . Left shoulder repair   . Shoulder open rotator cuff repair Right 05/03/2015   Procedure: RIGHT SHOULDER ROTATOR CUFF REPAIR OPEN WITH GRAFT AND ANCHOR ;  Surgeon: Latanya Maudlin, MD;  Location: [facility];  Service: Orthopedics;  Laterality: Right; HPI: 75 yo male with hemorrhagic shock leading to cardiac arrest, VDRF, AKI from Rt perinephric hematoma. He was intubated 5/03-5/13, 5/16-5/27. He has hx of Lt renal cell carcinoma s/p Lt nephrectomy, prostate cancer s/p XRT, PE in 2008, OSA, HTN, HLD, DM, A fib. Subjective: pt alert, pleasant Assessment / Plan /  Recommendation CHL IP CLINICAL IMPRESSIONS 07/14/2015 Therapy Diagnosis Mild pharyngeal phase dysphagia;Moderate pharyngeal phase dysphagia Clinical Impression Pt shows improvements in his mild-moderate oropharyngeal dysphagia since previous MBS. Oral phase overall is strong and effective, although he does have piecemeal swallowing particularly with liquids. He continues to have a brief delay in swallow trigger but now with most boluses being contained within the valleculae prior to initiation. The exception is when he consumes thin liquids via straw, as they reach the pyriform sinuses and are subsequently aspirated before the swallow. Cued coughing is productive of  secretions mixed with barium, although it is unclear if all aspirates were expelled. A mild-moderate amount of residue still remains with solid textures. Recommend to continue Dys 2 diet but with advancement to thin liquids by cup. Anticipate further improvements as overall strength continues to progress. Impact on safety and function Mild aspiration risk   CHL IP TREATMENT RECOMMENDATION 07/14/2015 Treatment Recommendations Therapy as outlined in treatment plan below   Prognosis 07/14/2015 Prognosis for Safe Diet Advancement Good Barriers to Reach Goals -- Barriers/Prognosis Comment -- CHL IP DIET RECOMMENDATION 07/14/2015 SLP Diet Recommendations Dysphagia 2 (Fine chop) solids;Thin liquid Liquid Administration via Cup;No straw Medication Administration Whole meds with puree Compensations Slow rate;Small sips/bites;Multiple dry swallows after each bite/sip Postural Changes Remain semi-upright after after feeds/meals (Comment);Seated upright at 90 degrees   CHL IP OTHER RECOMMENDATIONS 07/14/2015 Recommended Consults -- Oral Care Recommendations Oral care BID Other Recommendations Order thickener from pharmacy;Prohibited food (jello, ice cream, thin soups);Remove water pitcher   CHL IP FOLLOW UP RECOMMENDATIONS 07/14/2015 Follow up Recommendations LTACH   CHL IP FREQUENCY AND DURATION 07/14/2015 Speech Therapy Frequency (ACUTE ONLY) min 2x/week Treatment Duration 2 weeks      CHL IP ORAL PHASE 07/14/2015 Oral Phase Impaired Oral - Pudding Teaspoon -- Oral - Pudding Cup -- Oral - Honey Teaspoon -- Oral - Honey Cup -- Oral - Nectar Teaspoon NT Oral - Nectar Cup NT Oral - Nectar Straw NT Oral - Thin Teaspoon NT Oral - Thin Cup Piecemeal swallowing Oral - Thin Straw Piecemeal swallowing Oral - Puree WFL Oral - Mech Soft WFL Oral - Regular -- Oral - Multi-Consistency -- Oral - Pill -- Oral Phase - Comment --  CHL IP PHARYNGEAL PHASE 07/14/2015 Pharyngeal Phase Impaired Pharyngeal- Pudding Teaspoon -- Pharyngeal -- Pharyngeal- Pudding  Cup -- Pharyngeal -- Pharyngeal- Honey Teaspoon -- Pharyngeal -- Pharyngeal- Honey Cup -- Pharyngeal -- Pharyngeal- Nectar Teaspoon NT Pharyngeal -- Pharyngeal- Nectar Cup NT Pharyngeal -- Pharyngeal- Nectar Straw NT Pharyngeal -- Pharyngeal- Thin Teaspoon NT Pharyngeal -- Pharyngeal- Thin Cup Delayed swallow initiation-vallecula;Reduced tongue base retraction Pharyngeal Material does not enter airway Pharyngeal- Thin Straw Delayed swallow initiation-pyriform sinuses;Penetration/Aspiration before swallow;Reduced tongue base retraction Pharyngeal Material enters airway, passes BELOW cords without attempt by patient to eject out (silent aspiration) Pharyngeal- Puree Delayed swallow initiation-vallecula;Reduced tongue base retraction;Pharyngeal residue - valleculae Pharyngeal -- Pharyngeal- Mechanical Soft Delayed swallow initiation-vallecula;Reduced tongue base retraction;Pharyngeal residue - valleculae Pharyngeal -- Pharyngeal- Regular -- Pharyngeal -- Pharyngeal- Multi-consistency -- Pharyngeal -- Pharyngeal- Pill -- Pharyngeal -- Pharyngeal Comment --  CHL IP CERVICAL ESOPHAGEAL PHASE 07/14/2015 Cervical Esophageal Phase WFL Pudding Teaspoon -- Pudding Cup -- Honey Teaspoon -- Honey Cup -- Nectar Teaspoon -- Nectar Cup -- Nectar Straw -- Thin Teaspoon -- Thin Cup -- Thin Straw -- Puree -- Mechanical Soft -- Regular -- Multi-consistency -- Pill -- Cervical Esophageal Comment -- No flowsheet data found. Germain Osgood, M.A. CCC-SLP [phone] Germain Osgood 07/14/2015, 2:47 PM  BMET    Component Value Date/Time   NA 136 07/15/2015 0353   K 4.2 07/15/2015 0353   CL 100* 07/15/2015 0353   CO2 27 07/15/2015 0353   GLUCOSE 113* 07/15/2015 0353   BUN 16 07/15/2015 0353   CREATININE 2.75* 07/15/2015 0353   CALCIUM 7.9* 07/15/2015 0353   GFRNONAA 21* 07/15/2015 0353   GFRAA 25* 07/15/2015 0353   CBC    Component Value Date/Time   WBC 7.3 07/15/2015 0353   RBC 3.13* 07/15/2015 0353   RBC  2.67* 04/04/2013 0013   HGB 9.1* 07/15/2015 0353   HCT 30.7* 07/15/2015 0353   PLT 153 07/15/2015 0353   MCV 98.1 07/15/2015 0353   MCH 29.1 07/15/2015 0353   MCHC 29.6* 07/15/2015 0353   RDW 20.2* 07/15/2015 0353   LYMPHSABS 1.4 07/11/2015 0411   MONOABS 0.6 07/11/2015 0411   EOSABS 0.0 07/11/2015 0411   BASOSABS 0.0 07/11/2015 0411     Assessment: 1.  Acute on CKD 3 Sp HD yest.  Good chance he may be at ESRD 2. Klebsiella bacteremia 3.  Sec HPTH on rocaltrol 4. Anemia on aranesp 5. Hypotension, on midodrine 6. Had chronic indwelling foley PTA  Plan: 1.  Plan HD tomorrow  [facility] T

## 2015-07-15 NOTE — Care Management Note (Signed)
Case Management Note  Patient Details  Name: [name] MRN: [id number] Date of Birth: 25-Apr-1940  Subjective/Objective:     Acute on Chronic Renal Failure, ESRD,  Prostate Cancer              Action/Plan: Discharge Planning: Chart reviewed. Plan dc to SNF-rehab. Contacted Dialysis Clip Coordinator and outpt dialysis process has been started. CSW following for SNF-rehab.  PCP- Melinda Crutch MD   Expected Discharge Date:                Expected Discharge Plan:  [place]  In-House Referral:  Clinical Social Work  Discharge planning Services  CM Consult  Post Acute Care Choice:  NA Choice offered to:  NA  DME Arranged:  N/A DME Agency:  NA  HH Arranged:  NA HH Agency:  NA  Status of Service:  Completed, signed off  Medicare Important Message Given:    Date Medicare IM Given:    Medicare IM give by:    Date Additional Medicare IM Given:    Additional Medicare Important Message give by:     If discussed at [place] of Stay Meetings, dates discussed:    Additional Comments:  Erenest Rasher, RN 07/15/2015, 12:19 PM

## 2015-07-15 NOTE — Progress Notes (Signed)
ANTICOAGULATION CONSULT NOTE - Follow Up Consult  Pharmacy Consult for Coumadin Indication: hx afib/DVT/PE and acute DVT  No Known Allergies  Patient Measurements: Height: 5\' 10"  (177.8 cm) Weight: 246 lb 4.8 oz (111.721 kg) IBW/kg (Calculated) : 73   Vital Signs: Temp: 99.9 F (37.7 C) (06/09 0838) Temp Source: Oral (06/09 0838) BP: 104/72 mmHg (06/09 0838) Pulse Rate: 80 (06/09 0838)  Labs:  Recent Labs  07/13/15 0415 07/14/15 0325 07/15/15 0353  HGB 8.9* 8.7* 9.1*  HCT 29.5* 28.6* 30.7*  PLT 199 165 153  LABPROT 23.4* 26.1* 29.7*  INR 2.10* 2.43* 2.88*  CREATININE 2.78* 3.56* 2.75*    Estimated Creatinine Clearance: 29.9 mL/min (by C-G formula based on Cr of 2.75).  Assessment: 74yom on coumadin pta for hx afib, DVT/PE, admitted with hemorrhagic shock, coumadin held, and INR reversed. Hemorrhagic shock resolved and he was started on IV heparin 5/18. Coumadin resumed 5/31. Heparin dc'd 6/6.  Today's INR is therapeutic at 2.88. Hgb low but stable. Hematuria has cleared per RN. PO intake is lower and variable (0-50%)  Home PTA dose: 6mg  MWF, 5mg  all other days  Goal of Therapy:  INR 2-3 Monitor platelets by anticoagulation protocol: Yes   Plan:  Give warfarin 6 mg po x 1 Monitor daily INR, CBC, clinical course, s/sx of bleed, PO intake, DDI   Thank you for allowing [place] to participate in this patients care. Jens Som, PharmD Pager: [phone]  07/15/2015,10:27 AM

## 2015-07-16 LAB — RENAL FUNCTION PANEL
ANION GAP: 8 (ref 5–15)
Albumin: 1.8 g/dL — ABNORMAL LOW (ref 3.5–5.0)
BUN: 28 mg/dL — ABNORMAL HIGH (ref 6–20)
CHLORIDE: 98 mmol/L — AB (ref 101–111)
CO2: 27 mmol/L (ref 22–32)
Calcium: 7.9 mg/dL — ABNORMAL LOW (ref 8.9–10.3)
Creatinine, Ser: 3.63 mg/dL — ABNORMAL HIGH (ref 0.61–1.24)
GFR calc Af Amer: 18 mL/min — ABNORMAL LOW (ref >60)
GFR calc non Af Amer: 15 mL/min — ABNORMAL LOW (ref >60)
Glucose, Bld: 122 mg/dL — ABNORMAL HIGH (ref 65–99)
PHOSPHORUS: 4.9 mg/dL — AB (ref 2.5–4.6)
Potassium: 4.8 mmol/L (ref 3.5–5.1)
SODIUM: 133 mmol/L — AB (ref 135–145)

## 2015-07-16 LAB — CBC
HCT: 29.5 % — ABNORMAL LOW (ref 39.0–52.0)
Hemoglobin: 8.8 g/dL — ABNORMAL LOW (ref 13.0–17.0)
MCH: 28.5 pg (ref 26.0–34.0)
MCHC: 29.8 g/dL — ABNORMAL LOW (ref 30.0–36.0)
MCV: 95.5 fL (ref 78.0–100.0)
PLATELETS: 153 10*3/uL (ref 150–400)
RBC: 3.09 MIL/uL — AB (ref 4.22–5.81)
RDW: 19.3 % — AB (ref 11.5–15.5)
WBC: 6.7 10*3/uL (ref 4.0–10.5)

## 2015-07-16 LAB — PROTIME-INR
INR: 3.3 — ABNORMAL HIGH (ref 0.00–1.49)
Prothrombin Time: 32.9 seconds — ABNORMAL HIGH (ref 11.6–15.2)

## 2015-07-16 LAB — GLUCOSE, CAPILLARY
GLUCOSE-CAPILLARY: 103 mg/dL — AB (ref 65–99)
GLUCOSE-CAPILLARY: 126 mg/dL — AB (ref 65–99)
Glucose-Capillary: 217 mg/dL — ABNORMAL HIGH (ref 65–99)
Glucose-Capillary: 131 mg/dL — ABNORMAL HIGH (ref 65–99)

## 2015-07-16 MED ORDER — MIDODRINE HCL 5 MG PO TABS
ORAL_TABLET | ORAL | Status: AC
  Filled 2015-07-16: qty 2

## 2015-07-16 NOTE — Progress Notes (Signed)
Occupational Therapy Treatment Patient Details Name: [name] MRN: [id number] DOB: 12-20-1940 Today's Date: 07/16/2015    History of present illness Pt adm with septic shock likely due to UTI. Pt with renal failure and on CVVHD. Pt then with Rt perinephric hematoma that required embolization and pt with VDRF. Cardiac arrest 5/3. Reintubated 5/16. Extubated 5/27. PMH - recent (3/28) rt rotator cuff repair, Lt renal cell carcinoma s/p Lt nephrectomy, prostate cancer s/p XRT, PE in 2008, OSA, HTN, HLD, DM, A fib.   OT comments  Pt continues to improve.  He was able to move sit to stand with mod A +2 and took 6 steps to chair with min A +2.  Max HR 153.    Follow Up Recommendations  Supervision/Assistance - 24 hour;SNF    Equipment Recommendations  3 in 1 bedside comode    Recommendations for Other Services Rehab consult    Precautions / Restrictions Precautions Precautions: Fall Type of Shoulder Precautions: RTC repiar 04/2015 - per Cay Schillings 6/5 pt can use his RUE however he wants within his pain tolerance. Only self ROM Precaution Comments: Per Dr Titus Mould who called Dr. Gladstone Lighter,  Pt able to weight bear through Rt UE, but no ROM of Rt shoulder. Sent fax to [phone] 6/5 asking for specific ROM guildeline as pt is over 2 months out from repair.  Restrictions Weight Bearing Restrictions: No       Mobility Bed Mobility Overal bed mobility: Needs Assistance Bed Mobility: Supine to Sit     Supine to sit: Min assist;+2 for physical assistance     General bed mobility comments: assist to move LEs off bed and to lift trunk   Transfers Overall transfer level: Needs assistance Equipment used: Rolling walker (2 wheeled) Transfers: Sit to/from [facility] Sit to Stand: Mod assist;+2 physical assistance Stand pivot transfers: Min assist;+2 physical assistance       General transfer comment: With Bed height elevated, pt used momentum and mod A +2 to move into  standing.  He took 6 steps to chair with min A+2.  Max HR 153     Balance Overall balance assessment: Needs assistance Sitting-balance support: Feet supported Sitting balance-Leahy Scale: Fair     Standing balance support: Bilateral upper extremity supported Standing balance-Leahy Scale: Poor Standing balance comment: requires min A for balance                    ADL Overall ADL's : Needs assistance/impaired                         Toilet Transfer: Moderate assistance;+2 for physical assistance;BSC;RW   Toileting- Clothing Manipulation and Hygiene: Total assistance;Sit to/from stand       Functional mobility during ADLs: Moderate assistance;+2 for physical assistance;Rolling walker General ADL Comments: Pt able to pivot with RW and min A +2 - requires mod A +2 to move sit to stand       Vision                     Perception     Praxis      Cognition   Behavior During Therapy: [facility] for tasks assessed/performed Overall Cognitive Status: Within Functional Limits for tasks assessed                       Extremity/Trunk Assessment  Exercises     Shoulder Instructions       General Comments      Pertinent Vitals/ Pain       Pain Assessment: No/denies pain  Home Living                                          Prior Functioning/Environment              Frequency Min 2X/week     Progress Toward Goals  OT Goals(current goals can now be found in the care plan section)  Progress towards OT goals: Progressing toward goals  Acute Rehab OT Goals Patient Stated Goal: get stronger and return home OT Goal Formulation: With patient Time For Goal Achievement: 07/18/15 Potential to Achieve Goals: Good ADL Goals Pt Will Perform Grooming: with supervision;with set-up;sitting Pt Will Perform Upper Body Bathing: with set-up;with supervision;sitting Pt Will Transfer to Toilet: with +2 assist;with  min assist;bedside commode;stand pivot transfer Pt/caregiver will Perform Home Exercise Program: Increased ROM;Increased strength;Right Upper extremity;With Supervision;With written HEP provided  Plan Discharge plan remains appropriate;Frequency remains appropriate    Co-evaluation    PT/OT/SLP Co-Evaluation/Treatment: Yes Reason for Co-Treatment: Complexity of the patient's impairments (multi-system involvement);For patient/therapist safety   OT goals addressed during session: ADL's and self-care      End of Session Equipment Utilized During Treatment: Gait belt;Rolling walker   Activity Tolerance Patient tolerated treatment well   Patient Left in chair;with call bell/phone within reach;with nursing/sitter in room;with family/visitor present   Nurse Communication Mobility status        Time: [id number] OT Time Calculation (min): 23 min  Charges: OT General Charges $OT Visit: 1 Procedure OT Treatments $Therapeutic Activity: 8-22 mins  Jovi Zavadil M 07/16/2015, 10:12 AM

## 2015-07-16 NOTE — Progress Notes (Signed)
[place] TEAM 1 - Stepdown/ICU TEAM  [name]  [name] DOB: [birth year] DOA: 05/31/2015 PCP: Melinda Crutch    Brief Narrative:  75 y.o. M w/ Hx Pulmonary emboli 2008, DVT, Afib, HTN, Dysrhythmia, HLD, DM2, Prostate CA s/p radiation 2011, and RCC s/p Lt Nephrectomy who was seen in ED 4/22 for clogged catheter which was irrigated and replaced. He apparently has had hematuria for quite some time which has been attributed to scarring from prior radiation.  On 04/25 he went to have his INR checked and it was 8.  He was hypotensive with SBP 70/44. He sent to the ED.  In the ED he was found to have a possible UTI and despite 4.5L IVF he remained hypotensive. He also had multiple metabolic derangements including Na 124, K 5.0, AGMA, lactate 3, SCr 9.36. PCCM completed admission for septic shock due to urosepsis as well as AKI.  Significant Events: 4/25 admitted by [facility] w/ septic shock due to UTI + ARF  4/26 Renal u/s > s/p Lt nephrectomy 4/27 CT abd/pelvis > Rt perinephric hematoma 4/30 [facility] assumed care  5/02 CT abd/pelvis > increased size of hematoma 5/03 Cardiac arrest w/ severe anemia > coil embolization R kidney by IR; PTX post-CPR 5/08 TTE EF 65-70% 5/16 Reintubated due to airway secretions 5/18 Doppler legs b/l > Acute DVT Rt gastrocnemius vein, age indeterminate DVT Rt femoral vein and popliteal vein > heparin gtt 5/23 TTE Moderate LVH - EF 65-70%. No wall motion abnormalities. LA mildly dilated. No AS or AR.  5/25 LLE Arterial Duplex: No evidence of stenosis or occluded arteries. 5/27- extubated 5/28 off cvvhd 6/4 CorTrak tube removed  Subjective: The patient is sitting up in a bedside chair.  He denies cp, sob, n/v, or abdom pain.  He is very anxious about being transferred to a different unit, and is already worried about possibly being d/c to a rehab facility "mid to late next week" telling me he feels he is being rushed.    Assessment & Plan:  Acute on Chronic Renal  Failure > now ESRD HD per Nephrology - will need eventual permanent access, possibly to be placed next week - has a tunneled HD cath in L chest - appears this is ESRD  Hemorrhagic Shock / Acute Blood loss Anemia  -secondary to Rt Perinephric Hematoma - hemodynamically stable at this time - Hgb is stable   Cardiac Arrest 5/3 -felt to be due to severe anemia   Pneumothorax after CPR 5/03 -chest tubes removed 5/14 - no resp distress   Right Gastrocnemius vein Acute DVT / DVT Right femoral & popliteal veins -warfarin - monitor hemoglobin closely secondary to patient's episode of hemorrhagic shock -INR creeping upward - dosing of warfarin per Pharmacy   Chronic diastolic CHF -no signif overload at this time - volume control per HD  [facility] Weights   07/14/15 1057 07/15/15 0355 07/16/15 0448  Weight: 109.4 kg (241 lb 2.9 oz) 111.721 kg (246 lb 4.8 oz) 111.086 kg (244 lb 14.4 oz)    Ischemic Changes Left Foot  -Arterial duplex w/o notable findings  Chronic A fib Rate controlled - on anticoag w/ warfarin    Severe OSA on CPAP confirmed on sleep study 2009  H/O Left Nephrectomy for RCC  Hx prostate CA s/p radiation 2011  Chronic Hematuria -pt has been refusing scheduled foley flushes - flush foley PRN to prevent clotting of cath  Septic Shock due to Klebsiella Pyelo and Bacteremia  -completed 14 day antibiotic  course 5/10  DM2  -5/2 A1C 7.8 - CBG reasonably controlled   Acute Encephalopathy - resolved -due to above - resolved   Dysphagia - resolved -Passed swallow evaluation - has now been graduated to thin liquids / D2  Obesity - Body mass index is 41.22 kg/(m^2).  DVT prophylaxis: warfarin  Code Status: FULL CODE Family Communication:  Disposition Plan: plan is for eventual d/c to SNF for a rehab stay - anticipate probable d/c late next week if all goes well but will have low threshold to extend stay if required   Consultants:  Nephrology [facility]  Surgery Orthopedics Urology  PCCM  Antimicrobials:  4/28 Rocephin > 5/10  Objective: Blood pressure 106/31, pulse 75, temperature 100.6 F (38.1 C), temperature source Axillary, resp. rate 20, height 5\' 10"  (1.778 m), weight 111.086 kg (244 lb 14.4 oz), SpO2 100 %.  Intake/Output Summary (Last 24 hours) at 07/16/15 1039 Last data filed at 07/16/15 1000  Gross per 24 hour  Intake   1200 ml  Output    150 ml  Net   1050 ml   Filed Weights   07/14/15 1057 07/15/15 0355 07/16/15 0448  Weight: 109.4 kg (241 lb 2.9 oz) 111.721 kg (246 lb 4.8 oz) 111.086 kg (244 lb 14.4 oz)    Examination: General: No acute respiratory distress - alert/conversant  Lungs: Clear to auscultation bilaterally Cardiovascular: Regular rate without murmur or rub  Abdomen: Nontender, overweight, soft, bowel sounds positive, no rebound Extremities: No significant cyanosis, or clubbing - 1+ edema bilateral lower extremities   CBC:  Recent Labs Lab 07/11/15 0411 07/12/15 0414 07/13/15 0415 07/14/15 0325 07/15/15 0353 07/16/15 0223  WBC 15.1* 11.2* 8.2 6.4 7.3 6.7  NEUTROABS 13.1*  --   --   --   --   --   HGB 8.6* 8.6* 8.9* 8.7* 9.1* 8.8*  HCT 29.0* 28.6* 29.5* 28.6* 30.7* 29.5*  MCV 95.7 97.3 94.9 94.1 98.1 95.5  PLT 258 218 199 165 153 0000000   Basic Metabolic Panel:  Recent Labs Lab 07/11/15 0411 07/12/15 0414 07/13/15 0415 07/14/15 0325 07/15/15 0353 07/16/15 0223  NA 134* 131* 131* 130* 136 133*  K 4.1 4.7 3.8 4.5 4.2 4.8  CL 98* 94* 93* 93* 100* 98*  CO2 25 25 25 27 27 27   GLUCOSE 110* 106* 82 86 113* 122*  BUN 36* 49* 21* 31* 16 28*  CREATININE 3.92* 4.43* 2.78* 3.56* 2.75* 3.63*  CALCIUM 8.1* 8.2* 7.8* 7.9* 7.9* 7.9*  MG 1.9  --   --   --   --   --   PHOS  --  4.7* 3.8 4.7* 3.9 4.9*   GFR: Estimated Creatinine Clearance: 22.6 mL/min (by C-G formula based on Cr of 3.63).  Liver Function Tests:  Recent Labs Lab 07/11/15 0411 07/12/15 0414 07/13/15 0415 07/14/15 0325  07/15/15 0353 07/16/15 0223  AST 42*  --   --   --   --   --   ALT 62  --   --   --   --   --   ALKPHOS 96  --   --   --   --   --   BILITOT 0.9  --   --   --   --   --   PROT 5.3*  --   --   --   --   --   ALBUMIN 2.1* 2.2* 2.0* 2.0* 1.9* 1.8*    Coagulation Profile:  Recent Labs Lab  07/12/15 0414 07/13/15 0415 07/14/15 0325 07/15/15 0353 07/16/15 0223  INR 2.11* 2.10* 2.43* 2.88* 3.30*    HbA1C: HGB A1C MFR BLD  Date/Time Value Ref Range Status  06/07/2015 05:15 PM 7.8* 4.8 - 5.6 % Final    Comment:    (NOTE)         Pre-diabetes: 5.7 - 6.4         Diabetes: >6.4         Glycemic control for adults with diabetes: <7.0   04/29/2015 03:45 PM 8.6* 4.8 - 5.6 % Final    Comment:    (NOTE)         Pre-diabetes: 5.7 - 6.4         Diabetes: >6.4         Glycemic control for adults with diabetes: <7.0     CBG:  Recent Labs Lab 07/15/15 0834 07/15/15 1245 07/15/15 1641 07/15/15 2118 07/16/15 0807  GLUCAP 111* 249* 157* 185* 103*    Recent Results (from the past 240 hour(s))  C difficile quick scan w PCR reflex     Status: None   Collection Time: 07/07/15  8:41 PM  Result Value Ref Range Status   C Diff antigen NEGATIVE NEGATIVE Final   C Diff toxin NEGATIVE NEGATIVE Final   C Diff interpretation Negative for toxigenic C. difficile  Final  MRSA PCR Screening     Status: None   Collection Time: 07/11/15 12:03 PM  Result Value Ref Range Status   MRSA by PCR NEGATIVE NEGATIVE Final    Comment:        The GeneXpert MRSA Assay (FDA approved for NASAL specimens only), is one component of a comprehensive MRSA colonization surveillance program. It is not intended to diagnose MRSA infection nor to guide or monitor treatment for MRSA infections.      Scheduled Meds: . antiseptic oral rinse  7 mL Mouth Rinse BID  . calcitRIOL  0.25 mcg Oral Q M,W,F  . darbepoetin (ARANESP) injection - DIALYSIS  200 mcg Intravenous Q Tue-HD  . heparin  40 Units/kg Dialysis  Once in dialysis  . insulin aspart  0-15 Units Subcutaneous TID WC  . insulin aspart  0-5 Units Subcutaneous QHS  . insulin NPH Human  13 Units Subcutaneous BID AC & HS  . midodrine  10 mg Oral TID AC  . multivitamin  1 tablet Oral QHS  . predniSONE  10 mg Oral Q breakfast  . tamsulosin  0.4 mg Oral Daily  . Warfarin - Pharmacist Dosing Inpatient   Does not apply q1800     LOS: 46 days   Time spent: 25 minutes   Cherene Altes, MD [facility] Office  [phone] Pager - Text Page per Amion as per below:  On-Call/Text Page:      [email or web address]      password [email or web address]  If 7PM-7AM, please contact night-coverage [email or web address] Password [email or web address] 07/16/2015, 10:39 AM

## 2015-07-16 NOTE — Progress Notes (Signed)
Patient making steady progress towards PT goals. Was able to tolerate transfer training as well as take some steps with RW this session (6 steps with +2 min assist). Educated patient on LE strengthening exercises. Will continue to see and progress as tolerated.     07/16/15 1000  PT Visit Information  Last PT Received On 07/16/15  Assistance Needed +2  PT/OT/SLP Co-Evaluation/Treatment Yes  Reason for Co-Treatment Complexity of the patient's impairments (multi-system involvement);For patient/therapist safety  OT goals addressed during session ADL's and self-care  History of Present Illness Pt adm with septic shock likely due to UTI. Pt with renal failure and on CVVHD. Pt then with Rt perinephric hematoma that required embolization and pt with VDRF. Cardiac arrest 5/3. Reintubated 5/16. Extubated 5/27. PMH - recent (3/28) rt rotator cuff repair, Lt renal cell carcinoma s/p Lt nephrectomy, prostate cancer s/p XRT, PE in 2008, OSA, HTN, HLD, DM, A fib.  Subjective Data  Patient Stated Goal get stronger and return home  Precautions  Precautions Fall  Type of Shoulder Precautions RTC repiar 04/2015 - per Cay Schillings 6/5 pt can use his RUE however he wants within his pain tolerance. Only self ROM  Precaution Comments Per Dr Titus Mould who called Dr. Gladstone Lighter,  Pt able to weight bear through Rt UE, but no ROM of Rt shoulder. Sent fax to [phone] 6/5 asking for specific ROM guildeline as pt is over 2 months out from repair.   Pain Assessment  Pain Assessment No/denies pain  Cognition  Arousal/Alertness Awake/alert  Behavior During Therapy WFL for tasks assessed/performed  Overall Cognitive Status Within Functional Limits for tasks assessed  Bed Mobility  Overal bed mobility Needs Assistance  Bed Mobility Supine to Sit  Supine to sit Min assist;+2 for physical assistance  General bed mobility comments assist to move LEs off bed and to lift trunk   Transfers  Overall transfer level Needs assistance   Equipment used Rolling walker (2 wheeled)  Transfers Sit to/from [employer] Transfers  Sit to Stand Mod assist;+2 physical assistance  Stand pivot transfers Min assist;+2 physical assistance  General transfer comment With Bed height elevated, pt used momentum and mod A +2 to move into standing.  He took 6 steps to chair with min A+2.  Max HR 153   Ambulation/Gait  Ambulation/Gait assistance Min assist;+2 physical assistance  Ambulation Distance (Feet) 6 Feet  Assistive device Rolling walker (2 wheeled)  Balance  Overall balance assessment Needs assistance  Sitting-balance support Feet supported  Sitting balance-Leahy Scale Fair  Standing balance support Bilateral upper extremity supported  Standing balance-Leahy Scale Poor  Standing balance comment requires min A for balance   PT - End of Session  Equipment Utilized During Treatment Gait belt;Oxygen  Activity Tolerance Patient limited by fatigue  Patient left in chair;with call bell/phone within reach;with family/visitor present  Nurse Communication Mobility status;Need for lift equipment;Patient requests pain meds  PT - Assessment/Plan  PT Plan Current plan remains appropriate  PT Frequency (ACUTE ONLY) Min 3X/week  Follow Up Recommendations SNF;Supervision for mobility/OOB (possibly in the future depending on progress)  PT equipment Other (comment) (To be assessed)  PT Goal Progression  Progress towards PT goals Progressing toward goals  Acute Rehab PT Goals  PT Goal Formulation With patient  Time For Goal Achievement 07/18/15  Potential to Achieve Goals Fair  PT Time Calculation  PT Start Time (ACUTE ONLY) 0827  PT Stop Time (ACUTE ONLY) 0851  PT Time Calculation (min) (ACUTE ONLY) 24 min  PT General Charges  $$ ACUTE PT VISIT 1 Procedure  PT Treatments  $Therapeutic Activity 8-22 mins   Alben Deeds, [place] DPT  [phone]

## 2015-07-16 NOTE — Progress Notes (Signed)
ANTICOAGULATION CONSULT NOTE - Follow Up Consult  Pharmacy Consult for Coumadin Indication: hx afib/DVT/PE and acute DVT  No Known Allergies  Patient Measurements: Height: 5\' 10"  (177.8 cm) Weight: 244 lb 14.4 oz (111.086 kg) IBW/kg (Calculated) : 73   Vital Signs: Temp: 97.6 F (36.4 C) (06/10 0448) Temp Source: Axillary (06/10 0448) BP: 97/64 mmHg (06/10 0448) Pulse Rate: 75 (06/10 0448)  Labs:  Recent Labs  07/14/15 0325 07/15/15 0353 07/16/15 0223  HGB 8.7* 9.1* 8.8*  HCT 28.6* 30.7* 29.5*  PLT 165 153 153  LABPROT 26.1* 29.7* 32.9*  INR 2.43* 2.88* 3.30*  CREATININE 3.56* 2.75* 3.63*    Estimated Creatinine Clearance: 22.6 mL/min (by C-G formula based on Cr of 3.63).  Assessment: 74yom on coumadin pta for hx afib, DVT/PE, admitted with hemorrhagic shock, coumadin held, and INR reversed. Hemorrhagic shock resolved and he was started on IV heparin 5/18. Coumadin resumed 5/31. Heparin dc'd 6/6.   INR is SUPRA therapeutic at 3.30 today - sharp rise in INR 6/9 and received Coumadin last night. Hgb low but stable. Tea colored with blood clots in urine noted by RN last PM. PO intake is lower and variable (0-50%)  Home PTA dose: 6mg  MWF, 5mg  all other days  Goal of Therapy:  INR 2-3 Monitor platelets by anticoagulation protocol: Yes   Plan:  Hold Coumadin tonight due to elevated INR Monitor daily INR, CBC, clinical course, s/sx of bleed, PO intake, DDI   Sloan Leiter, PharmD, BCPS Clinical Pharmacist [phone]  07/16/2015,8:18 AM

## 2015-07-16 NOTE — Procedures (Signed)
Pt seen on HD  Ap 190 Vp 120.  BFR 400  SBP 79.  Will give second dose of midodrine now.

## 2015-07-16 NOTE — Progress Notes (Signed)
Attempted to irrigate foley, pt refused stated that it irritates the scars if done too frequently.

## 2015-07-16 NOTE — Progress Notes (Signed)
Hemodialysis- Tolerated well in recliner. Total UF 1.9L. Bp at baseline post rinseback 99/49. Pt currently has no complaints. Report given to Genelle Bal on 6E. Pt transferred to new room. Central monitoring notified.

## 2015-07-16 NOTE — Progress Notes (Signed)
S: Eating well.  Getting up to chair but unable to stand O:BP 97/64 mmHg  Pulse 75  Temp(Src) 97.6 F (36.4 C) (Axillary)  Resp 19  Ht 5\' 10"  (1.778 m)  Wt 111.086 kg (244 lb 14.4 oz)  BMI 35.14 kg/m2  SpO2 100%  Intake/Output Summary (Last 24 hours) at 07/16/15 0743 Last data filed at 07/16/15 0649  Gross per 24 hour  Intake    480 ml  Output    150 ml  Net    330 ml   Weight change: 1.686 kg (3 lb 11.5 oz) [id number] and alert CVS: irreg,irreg Resp: Clear Abd:+ BS NTND Ext: tr edema NEURO: CNI Ox3 no asterixis Rt IJ Permcath   . antiseptic oral rinse  7 mL Mouth Rinse BID  . calcitRIOL  0.25 mcg Oral Q M,W,F  . darbepoetin (ARANESP) injection - DIALYSIS  200 mcg Intravenous Q Tue-HD  . heparin  40 Units/kg Dialysis Once in dialysis  . insulin aspart  0-15 Units Subcutaneous TID WC  . insulin aspart  0-5 Units Subcutaneous QHS  . insulin NPH Human  13 Units Subcutaneous BID AC & HS  . midodrine  10 mg Oral TID AC  . multivitamin  1 tablet Oral QHS  . predniSONE  10 mg Oral Q breakfast  . tamsulosin  0.4 mg Oral Daily  . Warfarin - Pharmacist Dosing Inpatient   Does not apply q1800   Dg Swallowing Func-speech Pathology  07/14/2015  Objective Swallowing Evaluation: Type of Study: MBS-Modified Barium Swallow Study Patient Details Name: [name] MRN: [id number] Date of Birth: 06/14/1940 Today's Date: 07/14/2015 Time: SLP Start Time (ACUTE ONLY): 1341-SLP Stop Time (ACUTE ONLY): 1350 SLP Time Calculation (min) (ACUTE ONLY): 9 min Past Medical History: Past Medical History Diagnosis Date . Obesity  . Phlebitis    Lower extermity . Pulmonary emboli ([place]) 2008   submassive, saddle . Prostate cancer ([place]) 07/2009 . Sleep apnea    on CPAP . Hx of echocardiogram 12/04/2010   Normal EF >55% no significant valve disease . History of stress test 06/27/2009   Low risk and EF of approximately 50% . DVT (deep venous thrombosis) ([place])  . Chronic kidney disease, stage 3    baseline creatinine  ~1.4 . HLD (hyperlipidemia)  . HTN (hypertension)  . Dysrhythmia    A fib . Diabetes mellitus ([place])    diet controlled . History of hiatal hernia  Past Surgical History: Past Surgical History Procedure Laterality Date . Nephrectomy  1999   for CA . Cholecystectomy  1999 . Transurethral resection of bladder tumor N/A 03/04/2013   Procedure: CYSTOSCOPY WITH RIGHT RETROGRADE PYELOGRAM AND BLADDER BIOPSY /CLOT EVACUATION/ BIOPSY PROSTATIC URETHRA WITH FULGERATION ;  Surgeon: Molli Hazard, MD;  Location: [facility];  Service: Urology;  Laterality: N/A; . Cystoscopy w/ retrogrades N/A 04/08/2013   Procedure: CYSTOSCOPY WITH CLOT EVACUATION;  Surgeon: Molli Hazard, MD;  Location: [facility];  Service: Urology;  Laterality: N/A; . Left shoulder repair   . Shoulder open rotator cuff repair Right 05/03/2015   Procedure: RIGHT SHOULDER ROTATOR CUFF REPAIR OPEN WITH GRAFT AND ANCHOR ;  Surgeon: Latanya Maudlin, MD;  Location: [facility];  Service: Orthopedics;  Laterality: Right; HPI: 75 yo male with hemorrhagic shock leading to cardiac arrest, VDRF, AKI from Rt perinephric hematoma. He was intubated 5/03-5/13, 5/16-5/27. He has hx of Lt renal cell carcinoma s/p Lt nephrectomy, prostate cancer s/p XRT, PE in 2008, OSA, HTN, HLD, DM, A fib. Subjective:  pt alert, pleasant Assessment / Plan / Recommendation CHL IP CLINICAL IMPRESSIONS 07/14/2015 Therapy Diagnosis Mild pharyngeal phase dysphagia;Moderate pharyngeal phase dysphagia Clinical Impression Pt shows improvements in his mild-moderate oropharyngeal dysphagia since previous MBS. Oral phase overall is strong and effective, although he does have piecemeal swallowing particularly with liquids. He continues to have a brief delay in swallow trigger but now with most boluses being contained within the valleculae prior to initiation. The exception is when he consumes thin liquids via straw, as they reach the pyriform sinuses and are subsequently aspirated before the swallow. Cued  coughing is productive of secretions mixed with barium, although it is unclear if all aspirates were expelled. A mild-moderate amount of residue still remains with solid textures. Recommend to continue Dys 2 diet but with advancement to thin liquids by cup. Anticipate further improvements as overall strength continues to progress. Impact on safety and function Mild aspiration risk   CHL IP TREATMENT RECOMMENDATION 07/14/2015 Treatment Recommendations Therapy as outlined in treatment plan below   Prognosis 07/14/2015 Prognosis for Safe Diet Advancement Good Barriers to Reach Goals -- Barriers/Prognosis Comment -- CHL IP DIET RECOMMENDATION 07/14/2015 SLP Diet Recommendations Dysphagia 2 (Fine chop) solids;Thin liquid Liquid Administration via Cup;No straw Medication Administration Whole meds with puree Compensations Slow rate;Small sips/bites;Multiple dry swallows after each bite/sip Postural Changes Remain semi-upright after after feeds/meals (Comment);Seated upright at 90 degrees   CHL IP OTHER RECOMMENDATIONS 07/14/2015 Recommended Consults -- Oral Care Recommendations Oral care BID Other Recommendations Order thickener from pharmacy;Prohibited food (jello, ice cream, thin soups);Remove water pitcher   CHL IP FOLLOW UP RECOMMENDATIONS 07/14/2015 Follow up Recommendations LTACH   CHL IP FREQUENCY AND DURATION 07/14/2015 Speech Therapy Frequency (ACUTE ONLY) min 2x/week Treatment Duration 2 weeks      CHL IP ORAL PHASE 07/14/2015 Oral Phase Impaired Oral - Pudding Teaspoon -- Oral - Pudding Cup -- Oral - Honey Teaspoon -- Oral - Honey Cup -- Oral - Nectar Teaspoon NT Oral - Nectar Cup NT Oral - Nectar Straw NT Oral - Thin Teaspoon NT Oral - Thin Cup Piecemeal swallowing Oral - Thin Straw Piecemeal swallowing Oral - Puree WFL Oral - Mech Soft WFL Oral - Regular -- Oral - Multi-Consistency -- Oral - Pill -- Oral Phase - Comment --  CHL IP PHARYNGEAL PHASE 07/14/2015 Pharyngeal Phase Impaired Pharyngeal- Pudding Teaspoon -- Pharyngeal  -- Pharyngeal- Pudding Cup -- Pharyngeal -- Pharyngeal- Honey Teaspoon -- Pharyngeal -- Pharyngeal- Honey Cup -- Pharyngeal -- Pharyngeal- Nectar Teaspoon NT Pharyngeal -- Pharyngeal- Nectar Cup NT Pharyngeal -- Pharyngeal- Nectar Straw NT Pharyngeal -- Pharyngeal- Thin Teaspoon NT Pharyngeal -- Pharyngeal- Thin Cup Delayed swallow initiation-vallecula;Reduced tongue base retraction Pharyngeal Material does not enter airway Pharyngeal- Thin Straw Delayed swallow initiation-pyriform sinuses;Penetration/Aspiration before swallow;Reduced tongue base retraction Pharyngeal Material enters airway, passes BELOW cords without attempt by patient to eject out (silent aspiration) Pharyngeal- Puree Delayed swallow initiation-vallecula;Reduced tongue base retraction;Pharyngeal residue - valleculae Pharyngeal -- Pharyngeal- Mechanical Soft Delayed swallow initiation-vallecula;Reduced tongue base retraction;Pharyngeal residue - valleculae Pharyngeal -- Pharyngeal- Regular -- Pharyngeal -- Pharyngeal- Multi-consistency -- Pharyngeal -- Pharyngeal- Pill -- Pharyngeal -- Pharyngeal Comment --  CHL IP CERVICAL ESOPHAGEAL PHASE 07/14/2015 Cervical Esophageal Phase WFL Pudding Teaspoon -- Pudding Cup -- Honey Teaspoon -- Honey Cup -- Nectar Teaspoon -- Nectar Cup -- Nectar Straw -- Thin Teaspoon -- Thin Cup -- Thin Straw -- Puree -- Mechanical Soft -- Regular -- Multi-consistency -- Pill -- Cervical Esophageal Comment -- No flowsheet data found. Germain Osgood, M.A. CCC-SLP [phone]  Germain Osgood 07/14/2015, 2:47 PM              BMET    Component Value Date/Time   NA 133* 07/16/2015 0223   K 4.8 07/16/2015 0223   CL 98* 07/16/2015 0223   CO2 27 07/16/2015 0223   GLUCOSE 122* 07/16/2015 0223   BUN 28* 07/16/2015 0223   CREATININE 3.63* 07/16/2015 0223   CALCIUM 7.9* 07/16/2015 0223   GFRNONAA 15* 07/16/2015 0223   GFRAA 18* 07/16/2015 0223   CBC    Component Value Date/Time   WBC 6.7 07/16/2015 0223   RBC 3.09*  07/16/2015 0223   RBC 2.67* 04/04/2013 0013   HGB 8.8* 07/16/2015 0223   HCT 29.5* 07/16/2015 0223   PLT 153 07/16/2015 0223   MCV 95.5 07/16/2015 0223   MCH 28.5 07/16/2015 0223   MCHC 29.8* 07/16/2015 0223   RDW 19.3* 07/16/2015 0223   LYMPHSABS 1.4 07/11/2015 0411   MONOABS 0.6 07/11/2015 0411   EOSABS 0.0 07/11/2015 0411   BASOSABS 0.0 07/11/2015 0411     Assessment: 1.  Acute on CKD 3 Sp HD yest.  Good chance he may be at ESRD.  Difficult to know about UO as no AM shift is recorded 2. Klebsiella bacteremia 3.  Sec HPTH on rocaltrol 4. Anemia on aranesp 5. Hypotension, on midodrine 6. Had chronic indwelling foley PTA  Plan: 1.  Plan HD today 2.  I think he needs to be stronger before he is ready for outpt HD as trying to hoyer lift him could be a problem.  At the very least taking him to inpt HD in wheelchair and practicing hoyer lifting before any discharge would be needed.  I cannot stress enough the importance of mimicking what things will be like as outpt otherwise he will be right back in hospital. 3. I also think it would be prudent to get perm access placed before DC.  [facility] has seen him and will notify them to get on schedule next week.  Pt and wife aware  Kayana Thoen T

## 2015-07-17 LAB — CBC
HCT: 29.7 % — ABNORMAL LOW (ref 39.0–52.0)
Hemoglobin: 8.7 g/dL — ABNORMAL LOW (ref 13.0–17.0)
MCH: 28.1 pg (ref 26.0–34.0)
MCHC: 29.3 g/dL — ABNORMAL LOW (ref 30.0–36.0)
MCV: 95.8 fL (ref 78.0–100.0)
PLATELETS: 159 10*3/uL (ref 150–400)
RBC: 3.1 MIL/uL — AB (ref 4.22–5.81)
RDW: 18.8 % — AB (ref 11.5–15.5)
WBC: 5.2 10*3/uL (ref 4.0–10.5)

## 2015-07-17 LAB — RENAL FUNCTION PANEL
Albumin: 1.8 g/dL — ABNORMAL LOW (ref 3.5–5.0)
Anion gap: 7 (ref 5–15)
BUN: 17 mg/dL (ref 6–20)
CHLORIDE: 100 mmol/L — AB (ref 101–111)
CO2: 30 mmol/L (ref 22–32)
CREATININE: 2.76 mg/dL — AB (ref 0.61–1.24)
Calcium: 8 mg/dL — ABNORMAL LOW (ref 8.9–10.3)
GFR calc non Af Amer: 21 mL/min — ABNORMAL LOW (ref >60)
GFR, EST AFRICAN AMERICAN: 25 mL/min — AB (ref >60)
Glucose, Bld: 105 mg/dL — ABNORMAL HIGH (ref 65–99)
Phosphorus: 4.5 mg/dL (ref 2.5–4.6)
Potassium: 3.8 mmol/L (ref 3.5–5.1)
SODIUM: 137 mmol/L (ref 135–145)

## 2015-07-17 LAB — GLUCOSE, CAPILLARY
GLUCOSE-CAPILLARY: 99 mg/dL (ref 65–99)
Glucose-Capillary: 139 mg/dL — ABNORMAL HIGH (ref 65–99)
Glucose-Capillary: 133 mg/dL — ABNORMAL HIGH (ref 65–99)
Glucose-Capillary: 138 mg/dL — ABNORMAL HIGH (ref 65–99)

## 2015-07-17 LAB — PROTIME-INR
INR: 3.27 — ABNORMAL HIGH (ref 0.00–1.49)
PROTHROMBIN TIME: 32.7 s — AB (ref 11.6–15.2)

## 2015-07-17 MED ORDER — PANTOPRAZOLE SODIUM 40 MG PO TBEC
40.0000 mg | DELAYED_RELEASE_TABLET | Freq: Every day | ORAL | Status: DC
  Administered 2015-07-18 – 2015-07-27 (×9): 40 mg via ORAL
  Filled 2015-07-17 (×8): qty 1

## 2015-07-17 NOTE — Progress Notes (Signed)
[place] TEAM 1 - Stepdown/ICU TEAM  [name]  [name] DOB: [birth year] DOA: 05/31/2015 PCP: Melinda Crutch    Brief Narrative:  75 y.o. M w/ Hx Pulmonary emboli 2008, DVT, Afib, HTN, Dysrhythmia, HLD, DM2, Prostate CA s/p radiation 2011, and RCC s/p Lt Nephrectomy who was seen in ED 4/22 for clogged catheter which was irrigated and replaced. He apparently has had hematuria for quite some time which has been attributed to scarring from prior radiation.  On 04/25 he went to have his INR checked and it was 8.  He was hypotensive with SBP 70/44. He sent to the ED.  In the ED he was found to have a possible UTI and despite 4.5L IVF he remained hypotensive. He also had multiple metabolic derangements including Na 124, K 5.0, AGMA, lactate 3, SCr 9.36. PCCM completed admission for septic shock due to urosepsis as well as AKI.  Significant Events: 4/25 admitted by [facility] w/ septic shock due to UTI + ARF  4/26 Renal u/s > s/p Lt nephrectomy 4/27 CT abd/pelvis > Rt perinephric hematoma 4/30 [facility] assumed care  5/02 CT abd/pelvis > increased size of hematoma 5/03 Cardiac arrest w/ severe anemia > coil embolization R kidney by IR; PTX post-CPR 5/08 TTE EF 65-70% 5/16 Reintubated due to airway secretions 5/18 Doppler legs b/l > Acute DVT Rt gastrocnemius vein, age indeterminate DVT Rt femoral vein and popliteal vein > heparin gtt 5/23 TTE Moderate LVH - EF 65-70%. No wall motion abnormalities. LA mildly dilated. No AS or AR.  5/25 LLE Arterial Duplex: No evidence of stenosis or occluded arteries. 5/27- extubated 5/28 off cvvhd 6/4 CorTrak tube removed  Subjective: Pt has no new complaints.  He denies cp, sob, n/v or abdom pain.  He reports a good appetite.     Assessment & Plan:  Acute on Chronic Renal Failure > now ESRD HD per Nephrology to begin Tu/Th/Sat schedule - permanent access to be placed this week - has a tunneled HD cath in L chest   Hemorrhagic Shock / Acute Blood loss  Anemia  -secondary to Rt Perinephric Hematoma - hemodynamically stable at this time - Hgb stable   Cardiac Arrest 5/3 -felt to be due to severe anemia   Pneumothorax after CPR 5/03 -chest tubes removed 5/14 - no resp distress   Right Gastrocnemius vein Acute DVT / DVT Right femoral & popliteal veins -warfarin - monitor hemoglobin closely secondary to patient's episode of hemorrhagic shock -dosing of warfarin per Pharmacy   Chronic diastolic CHF -no signif overload at this time - volume control per HD  [facility] Weights   07/16/15 0448 07/16/15 1854 07/16/15 2104  Weight: 111.086 kg (244 lb 14.4 oz) 111.721 kg (246 lb 4.8 oz) 112.356 kg (247 lb 11.2 oz)    Ischemic Changes Left Foot  -Arterial duplex w/o notable findings  Chronic A fib Rate controlled - on anticoag w/ warfarin    Severe OSA on CPAP confirmed on sleep study 2009  H/O Left Nephrectomy for RCC  Hx prostate CA s/p radiation 2011  Chronic Hematuria -pt has been refusing scheduled foley flushes - flush foley PRN to prevent clotting of cath  Septic Shock due to Klebsiella Pyelo and Bacteremia  -completed 14 day antibiotic course 5/10  DM2  -5/2 A1C 7.8 - CBG controlled   Acute Encephalopathy - resolved -due to above - resolved   Dysphagia - resolved -Passed swallow evaluation - has now been graduated to thin liquids / D2  Obesity -  Body mass index is 41.22 kg/(m^2).  DVT prophylaxis: warfarin  Code Status: FULL CODE Family Communication:  Disposition Plan: eventual d/c to SNF for a rehab stay - need permanent HD access placed prior to d/c - will have to be able to ambulate short distances and sit up in a chair prior to d/c in order to facilitate outpt HD   Consultants:  Nephrology [facility]  PCCM  Antimicrobials:  4/28 Rocephin > 5/10  Objective: Blood pressure 112/63, pulse 87, temperature 97.6 F (36.4 C), temperature source Axillary, resp. rate 18, height 5' 9.5"  (1.765 m), weight 112.356 kg (247 lb 11.2 oz), SpO2 99 %.  Intake/Output Summary (Last 24 hours) at 07/17/15 1526 Last data filed at 07/17/15 1300  Gross per 24 hour  Intake    485 ml  Output   2244 ml  Net  -1759 ml   Filed Weights   07/16/15 0448 07/16/15 1854 07/16/15 2104  Weight: 111.086 kg (244 lb 14.4 oz) 111.721 kg (246 lb 4.8 oz) 112.356 kg (247 lb 11.2 oz)    Examination: General: No acute respiratory distress  Lungs: Clear to auscultation w/o wheeze  Cardiovascular: Regular rate without murmur Abdomen: Nontender, overweight, soft, bowel sounds positive, no rebound Extremities: No significant cyanosis, or clubbing - 2+ edema bilateral lower extremities   CBC:  Recent Labs Lab 07/11/15 0411  07/13/15 0415 07/14/15 0325 07/15/15 0353 07/16/15 0223 07/17/15 0458  WBC 15.1*  < > 8.2 6.4 7.3 6.7 5.2  NEUTROABS 13.1*  --   --   --   --   --   --   HGB 8.6*  < > 8.9* 8.7* 9.1* 8.8* 8.7*  HCT 29.0*  < > 29.5* 28.6* 30.7* 29.5* 29.7*  MCV 95.7  < > 94.9 94.1 98.1 95.5 95.8  PLT 258  < > 199 165 153 153 159  < > = values in this interval not displayed. Basic Metabolic Panel:  Recent Labs Lab 07/11/15 0411  07/13/15 0415 07/14/15 0325 07/15/15 0353 07/16/15 0223 07/17/15 0458  NA 134*  < > 131* 130* 136 133* 137  K 4.1  < > 3.8 4.5 4.2 4.8 3.8  CL 98*  < > 93* 93* 100* 98* 100*  CO2 25  < > 25 27 27 27 30   GLUCOSE 110*  < > 82 86 113* 122* 105*  BUN 36*  < > 21* 31* 16 28* 17  CREATININE 3.92*  < > 2.78* 3.56* 2.75* 3.63* 2.76*  CALCIUM 8.1*  < > 7.8* 7.9* 7.9* 7.9* 8.0*  MG 1.9  --   --   --   --   --   --   PHOS  --   < > 3.8 4.7* 3.9 4.9* 4.5  < > = values in this interval not displayed. GFR: Estimated Creatinine Clearance: 29.7 mL/min (by C-G formula based on Cr of 2.76).  Liver Function Tests:  Recent Labs Lab 07/11/15 0411  07/13/15 0415 07/14/15 0325 07/15/15 0353 07/16/15 0223 07/17/15 0458  AST 42*  --   --   --   --   --   --   ALT 62   --   --   --   --   --   --   ALKPHOS 96  --   --   --   --   --   --   BILITOT 0.9  --   --   --   --   --   --  PROT 5.3*  --   --   --   --   --   --   ALBUMIN 2.1*  < > 2.0* 2.0* 1.9* 1.8* 1.8*  < > = values in this interval not displayed.  Coagulation Profile:  Recent Labs Lab 07/13/15 0415 07/14/15 0325 07/15/15 0353 07/16/15 0223 07/17/15 0458  INR 2.10* 2.43* 2.88* 3.30* 3.27*    HbA1C: HGB A1C MFR BLD  Date/Time Value Ref Range Status  06/07/2015 05:15 PM 7.8* 4.8 - 5.6 % Final    Comment:    (NOTE)         Pre-diabetes: 5.7 - 6.4         Diabetes: >6.4         Glycemic control for adults with diabetes: <7.0   04/29/2015 03:45 PM 8.6* 4.8 - 5.6 % Final    Comment:    (NOTE)         Pre-diabetes: 5.7 - 6.4         Diabetes: >6.4         Glycemic control for adults with diabetes: <7.0     CBG:  Recent Labs Lab 07/16/15 1130 07/16/15 1818 07/16/15 2108 07/17/15 0739 07/17/15 1127  GLUCAP 217* 126* 131* 99 139*    Recent Results (from the past 240 hour(s))  C difficile quick scan w PCR reflex     Status: None   Collection Time: 07/07/15  8:41 PM  Result Value Ref Range Status   C Diff antigen NEGATIVE NEGATIVE Final   C Diff toxin NEGATIVE NEGATIVE Final   C Diff interpretation Negative for toxigenic C. difficile  Final  MRSA PCR Screening     Status: None   Collection Time: 07/11/15 12:03 PM  Result Value Ref Range Status   MRSA by PCR NEGATIVE NEGATIVE Final    Comment:        The GeneXpert MRSA Assay (FDA approved for NASAL specimens only), is one component of a comprehensive MRSA colonization surveillance program. It is not intended to diagnose MRSA infection nor to guide or monitor treatment for MRSA infections.      Scheduled Meds: . antiseptic oral rinse  7 mL Mouth Rinse BID  . calcitRIOL  0.25 mcg Oral Q M,W,F  . darbepoetin (ARANESP) injection - DIALYSIS  200 mcg Intravenous Q Tue-HD  . heparin  40 Units/kg Dialysis Once in  dialysis  . insulin aspart  0-15 Units Subcutaneous TID WC  . insulin aspart  0-5 Units Subcutaneous QHS  . insulin NPH Human  13 Units Subcutaneous BID AC & HS  . midodrine  10 mg Oral TID AC  . multivitamin  1 tablet Oral QHS  . tamsulosin  0.4 mg Oral Daily  . Warfarin - Pharmacist Dosing Inpatient   Does not apply q1800     LOS: 47 days   Time spent: 25 minutes   Cherene Altes, MD [facility] Office  [phone] Pager - Text Page per Amion as per below:  On-Call/Text Page:      [email or web address]      password [email or web address]  If 7PM-7AM, please contact night-coverage [email or web address] Password [facility] 07/17/2015, 3:26 PM

## 2015-07-17 NOTE — Progress Notes (Signed)
S: Eating well. Still quite weak but working some with PT O:BP 112/63 mmHg  Pulse 87  Temp(Src) 97.6 F (36.4 C) (Axillary)  Resp 18  Ht 5' 9.5" (1.765 m)  Wt 112.356 kg (247 lb 11.2 oz)  BMI 36.07 kg/m2  SpO2 99%  Intake/Output Summary (Last 24 hours) at 07/17/15 1012 Last data filed at 07/17/15 0949  Gross per 24 hour  Intake    485 ml  Output   2244 ml  Net  -1759 ml   Weight change: 0.635 kg (1 lb 6.4 oz) [id number] and alert CVS: irreg,irreg Resp: Clear Abd:+ BS NTND Ext: tr edema NEURO: CNI Ox3 no asterixis Rt IJ Permcath   . antiseptic oral rinse  7 mL Mouth Rinse BID  . calcitRIOL  0.25 mcg Oral Q M,W,F  . darbepoetin (ARANESP) injection - DIALYSIS  200 mcg Intravenous Q Tue-HD  . heparin  40 Units/kg Dialysis Once in dialysis  . insulin aspart  0-15 Units Subcutaneous TID WC  . insulin aspart  0-5 Units Subcutaneous QHS  . insulin NPH Human  13 Units Subcutaneous BID AC & HS  . midodrine  10 mg Oral TID AC  . multivitamin  1 tablet Oral QHS  . tamsulosin  0.4 mg Oral Daily  . Warfarin - Pharmacist Dosing Inpatient   Does not apply q1800   No results found. BMET    Component Value Date/Time   NA 137 07/17/2015 0458   K 3.8 07/17/2015 0458   CL 100* 07/17/2015 0458   CO2 30 07/17/2015 0458   GLUCOSE 105* 07/17/2015 0458   BUN 17 07/17/2015 0458   CREATININE 2.76* 07/17/2015 0458   CALCIUM 8.0* 07/17/2015 0458   GFRNONAA 21* 07/17/2015 0458   GFRAA 25* 07/17/2015 0458   CBC    Component Value Date/Time   WBC 5.2 07/17/2015 0458   RBC 3.10* 07/17/2015 0458   RBC 2.67* 04/04/2013 0013   HGB 8.7* 07/17/2015 0458   HCT 29.7* 07/17/2015 0458   PLT 159 07/17/2015 0458   MCV 95.8 07/17/2015 0458   MCH 28.1 07/17/2015 0458   MCHC 29.3* 07/17/2015 0458   RDW 18.8* 07/17/2015 0458   LYMPHSABS 1.4 07/11/2015 0411   MONOABS 0.6 07/11/2015 0411   EOSABS 0.0 07/11/2015 0411   BASOSABS 0.0 07/11/2015 0411     Assessment: 1.  Acute on CKD 3 Sp HD yest.   Good chance he may be at ESRD.  Difficult to know about UO as no AM shift is recorded 2. Klebsiella bacteremia 3.  Sec HPTH on rocaltrol  4. Anemia on aranesp 5. Hypotension, on midodrine 6. Had chronic indwelling foley PTA  Plan: 1. Will put on TTS schedule 2.  Spoke to Dr Trula Slade who will let Dr Oneida Alar know that a perm access should be placed this week.  Pt aware and is agreeable  Viveka Wilmeth T

## 2015-07-17 NOTE — Progress Notes (Signed)
ANTICOAGULATION CONSULT NOTE - Follow Up Consult  Pharmacy Consult for Coumadin Indication: hx afib/DVT/PE and acute DVT  No Known Allergies  Patient Measurements: Height: 5' 9.5" (176.5 cm) Weight: 247 lb 11.2 oz (112.356 kg) IBW/kg (Calculated) : 71.85   Vital Signs: Temp: 97.6 F (36.4 C) (06/11 0741) Temp Source: Axillary (06/11 0741) BP: 112/63 mmHg (06/11 0741) Pulse Rate: 87 (06/11 0741)  Labs:  Recent Labs  07/15/15 0353 07/16/15 0223 07/17/15 0458  HGB 9.1* 8.8* 8.7*  HCT 30.7* 29.5* 29.7*  PLT 153 153 159  LABPROT 29.7* 32.9* 32.7*  INR 2.88* 3.30* 3.27*  CREATININE 2.75* 3.63* 2.76*    Estimated Creatinine Clearance: 29.7 mL/min (by C-G formula based on Cr of 2.76).  Assessment: 75 yo M on coumadin pta for hx afib, DVT/PE, admitted with hemorrhagic shock, coumadin held, and INR reversed. Hemorrhagic shock resolved and he was started on IV heparin 5/18. Coumadin resumed 5/31. Heparin dc'd 6/6.   INR is still SUPRA therapeutic at 3.27 today - sharp rise in INR 6/9 and held Coumadin dose last night 6/10. Hgb 8.7 stable, Plt wnl. Per RN no blood in foley bag today.  Home PTA dose: 6mg  MWF, 5mg  all other days  Goal of Therapy:  INR 2-3 Monitor platelets by anticoagulation protocol: Yes   Plan:  Hold Coumadin tonight due to elevated INR Monitor daily INR, CBC, clinical course and s/sx of bleeding  Dimitri Ped, PharmD. PGY-1 Pharmacy Resident Pager: [phone] 07/17/2015,8:15 AM

## 2015-07-18 LAB — CBC
HCT: 28.8 % — ABNORMAL LOW (ref 39.0–52.0)
HEMOGLOBIN: 8.5 g/dL — AB (ref 13.0–17.0)
MCH: 28.1 pg (ref 26.0–34.0)
MCHC: 29.5 g/dL — AB (ref 30.0–36.0)
MCV: 95.4 fL (ref 78.0–100.0)
Platelets: 145 10*3/uL — ABNORMAL LOW (ref 150–400)
RBC: 3.02 MIL/uL — ABNORMAL LOW (ref 4.22–5.81)
RDW: 18.4 % — AB (ref 11.5–15.5)
WBC: 5.1 10*3/uL (ref 4.0–10.5)

## 2015-07-18 LAB — PROTIME-INR
INR: 3.36 — AB (ref 0.00–1.49)
PROTHROMBIN TIME: 33.3 s — AB (ref 11.6–15.2)

## 2015-07-18 LAB — RENAL FUNCTION PANEL
ANION GAP: 10 (ref 5–15)
Albumin: 1.7 g/dL — ABNORMAL LOW (ref 3.5–5.0)
BUN: 30 mg/dL — ABNORMAL HIGH (ref 6–20)
CHLORIDE: 99 mmol/L — AB (ref 101–111)
CO2: 28 mmol/L (ref 22–32)
Calcium: 7.9 mg/dL — ABNORMAL LOW (ref 8.9–10.3)
Creatinine, Ser: 3.72 mg/dL — ABNORMAL HIGH (ref 0.61–1.24)
GFR calc Af Amer: 17 mL/min — ABNORMAL LOW (ref >60)
GFR calc non Af Amer: 15 mL/min — ABNORMAL LOW (ref >60)
GLUCOSE: 75 mg/dL (ref 65–99)
POTASSIUM: 4.2 mmol/L (ref 3.5–5.1)
Phosphorus: 4.6 mg/dL (ref 2.5–4.6)
SODIUM: 137 mmol/L (ref 135–145)

## 2015-07-18 LAB — GLUCOSE, CAPILLARY
GLUCOSE-CAPILLARY: 90 mg/dL (ref 65–99)
GLUCOSE-CAPILLARY: 134 mg/dL — AB (ref 65–99)
Glucose-Capillary: 92 mg/dL (ref 65–99)
Glucose-Capillary: 141 mg/dL — ABNORMAL HIGH (ref 65–99)

## 2015-07-18 MED ORDER — PRO-STAT SUGAR FREE PO LIQD
30.0000 mL | Freq: Two times a day (BID) | ORAL | Status: DC
  Administered 2015-07-18 – 2015-07-19 (×2): 30 mL via ORAL
  Filled 2015-07-18 (×2): qty 30

## 2015-07-18 NOTE — Telephone Encounter (Signed)
Spoke with Jasmine at [facility] - states that they need more information regarding the patient's current state. I advised that at this point there can be no more information given regarding the patient's "restrictions/limitations" and firm diagnosis as the patient is still hospitalized and in ICU. Pt is bedridden at this time and we are unsure of what limitations he will have upon returning to work or when he will return to work. Delana Meyer states that she spoke with the Examiner over this claim Orvan Falconer) and she has made him aware of all of this and they will look into the claim a little deeper to see if it can be pushed out as the patient is still in ICU. Jasmine states that one of the main issues was that #8 on the form was not legible and they could not read that it said "Pt is still in ICU." This has been all passed along to Orvan Falconer who is reviewing the claim and will let us know more in the next 24hrs.   Will await call back.   ---- LM for Ernestene Mention to make aware of the above

## 2015-07-18 NOTE — Progress Notes (Signed)
Occupational Therapy Treatment Patient Details Name: [name] MRN: [id number] DOB: Dec 24, 1940 Today's Date: 07/18/2015    History of present illness Pt adm with septic shock likely due to UTI. Pt with renal failure and on CVVHD. Pt then with Rt perinephric hematoma that required embolization and pt with VDRF. Cardiac arrest 5/3. Reintubated 5/16. Extubated 5/27. PMH - recent (3/28) rt rotator cuff repair, Lt renal cell carcinoma s/p Lt nephrectomy, prostate cancer s/p XRT, PE in 2008, OSA, HTN, HLD, DM, A fib.   OT comments  Pt seen while up in chair. Performed AAROM within pain tolerance R shoulder using table to support arm and pillow case to reduce friction on surface. Pt performing one grooming task in sitting and assisted with UB bathing. Wife present throughout session and instructed in setting pt up so he can perform shoulder exercises outside of therapy. Pt continues to demonstrate poor activity tolerance.  Follow Up Recommendations  SNF;Supervision/Assistance - 24 hour    Equipment Recommendations  3 in 1 bedside comode    Recommendations for Other Services      Precautions / Restrictions Precautions Precautions: Fall Type of Shoulder Precautions: RTC repiar 04/2015 - per Cay Schillings 6/5 pt can use his RUE however he wants within his pain tolerance. Only self ROM       Mobility Bed Mobility                  Transfers                      Balance                                   ADL Overall ADL's : Needs assistance/impaired     Grooming: Wash/dry hands;Wash/dry face;Minimal assistance;Sitting   Upper Body Bathing: Maximal assistance;Sitting                                    Vision                     Perception     Praxis      Cognition   Behavior During Therapy: WFL for tasks assessed/performed         Memory: Decreased short-term memory               Extremity/Trunk Assessment               Exercises General Exercises - Upper Extremity Shoulder Flexion: AAROM;10 reps;Seated;Right Shoulder Extension: AROM;10 reps;Seated;Right Shoulder ABduction: AAROM;10 reps;Seated;Right Shoulder ADduction: AAROM;10 reps;Seated;Right Shoulder Horizontal ABduction: AAROM;Right;10 reps;Seated Shoulder Horizontal ADduction: AAROM;15 reps;Seated;Right   Shoulder Instructions       General Comments      Pertinent Vitals/ Pain       Pain Assessment: Faces Faces Pain Scale: Hurts little more Pain Location: R shoulder with AAROM Pain Descriptors / Indicators: Guarding;Grimacing;Sore Pain Intervention(s): Monitored during session;Repositioned;Limited activity within patient's tolerance  Home Living                                          Prior Functioning/Environment              Frequency Min 2X/week     Progress Toward Goals  OT Goals(current goals can now be found in the care plan section)  Progress towards OT goals: Progressing toward goals  Acute Rehab OT Goals Patient Stated Goal: get stronger and return home Time For Goal Achievement: 08/01/15 Potential to Achieve Goals: Good  Plan Discharge plan remains appropriate    Co-evaluation                 End of Session     Activity Tolerance Patient limited by fatigue   Patient Left in chair;with call bell/phone within reach;with family/visitor present   Nurse Communication          Time: 1145-1200 OT Time Calculation (min): 15 min  Charges: OT General Charges $OT Visit: 1 Procedure OT Treatments $Therapeutic Exercise: 8-22 mins  Malka So 07/18/2015, 2:14 PM  [phone]

## 2015-07-18 NOTE — Progress Notes (Signed)
ANTICOAGULATION CONSULT NOTE - Follow Up Consult  Pharmacy Consult for Coumadin Indication: hx afib/DVT/PE and acute DVT  No Known Allergies  Patient Measurements: Height: 5' 9.5" (176.5 cm) Weight: 250 lb 1.6 oz (113.445 kg) IBW/kg (Calculated) : 71.85   Vital Signs: Temp: 97.7 F (36.5 C) (06/12 0740) Temp Source: Oral (06/12 0740) BP: 108/71 mmHg (06/12 0740) Pulse Rate: 86 (06/12 0740)  Labs:  Recent Labs  07/16/15 0223 07/17/15 0458 07/18/15 0413  HGB 8.8* 8.7* 8.5*  HCT 29.5* 29.7* 28.8*  PLT 153 159 145*  LABPROT 32.9* 32.7* 33.3*  INR 3.30* 3.27* 3.36*  CREATININE 3.63* 2.76* 3.72*    Estimated Creatinine Clearance: 22.1 mL/min (by C-G formula based on Cr of 3.72).  Assessment: 75 yo M on coumadin pta for hx afib, DVT/PE, admitted with hemorrhagic shock, coumadin held, and INR reversed. Hemorrhagic shock resolved and he was started on IV heparin 5/18. Coumadin resumed 5/31. Heparin dc'd 6/6.   INR is still SUPRA therapeutic at 3.36 today - last 2 doses held. Hgb high 8s stable, Plt down to 145. Per RN no blood in foley bag today.  Home PTA dose: 6mg  MWF, 5mg  all other days  Goal of Therapy:  INR 2-3 Monitor platelets by anticoagulation protocol: Yes   Plan:  Hold Coumadin tonight due to elevated INR Monitor daily INR, CBC, clinical course and s/sx of bleeding  [facility], Pharm.D., BCPS Clinical Pharmacist Pager: [phone] 07/18/2015 10:30 AM

## 2015-07-18 NOTE — Telephone Encounter (Signed)
Called [employer] [facility] and spoke with [employer]. States that the form we submitted were not complete. There are several sections that were not done. These forms have been printed off and the sections that need to be completed have been highlighted. Forms will be given to Ashtyn to have VS fill them out.

## 2015-07-18 NOTE — Telephone Encounter (Signed)
Per Dr Halford Chessman, form can not be completed as the patient is in ICU and there is no way at this time to know the answers to those questions.  Need to call [facility] and discuss this with them and find out what the next best steps would be.

## 2015-07-18 NOTE — Progress Notes (Signed)
Physical Therapy Treatment Patient Details Name: NASHID PELLUM MRN: [id number] DOB: 08-03-1940 Today's Date: 07/18/2015    History of Present Illness Pt adm with septic shock likely due to UTI. Pt with renal failure and on CVVHD. Pt then with Rt perinephric hematoma that required embolization and pt with VDRF. Cardiac arrest 5/3. Reintubated 5/16. Extubated 5/27. PMH - recent (3/28) rt rotator cuff repair, Lt renal cell carcinoma s/p Lt nephrectomy, prostate cancer s/p XRT, PE in 2008, OSA, HTN, HLD, DM, A fib.    PT Comments    Participating well with PT; Wife tells me Mr. Norkus is more sleepy today; Initial goals set have been met and I updated pt's goals based on progress  Follow Up Recommendations  SNF;Supervision/Assistance - 24 hour     Equipment Recommendations  Rolling walker with 5" wheels;3in1 (PT);Wheelchair (measurements PT);Wheelchair cushion (measurements PT)    Recommendations for Other Services       Precautions / Restrictions Precautions Precautions: Fall Type of Shoulder Precautions: RTC repiar 04/2015 - per Cay Schillings 6/5 pt can use his RUE however he wants within his pain tolerance. Only self ROM    Mobility  Bed Mobility Overal bed mobility: Needs Assistance Bed Mobility: Rolling;Sidelying to Sit Rolling: Mod assist Sidelying to sit: Mod assist;HOB elevated       General bed mobility comments: assist to move LEs off bed and to lift trunk   Transfers Overall transfer level: Needs assistance Equipment used: Rolling walker (2 wheeled) Transfers: Sit to/from [employer] Sit to Stand: Mod assist;Max assist Stand pivot transfers: +2 physical assistance;Max assist       General transfer comment: Elevated bed height, cues for positioning center of mass closer to base of support; dependent on momentum; Initial sit to stand with +2 max assist and difficulty getting hips over feet for upright standing, had to sit back down to bed; Second rep with  more forward momentum and heavy mod assist to come all the way to stand; pivot steps bed to recliner with +2 assist  Ambulation/Gait             General Gait Details: pivotal steps bed to chair   Stairs            Wheelchair Mobility    Modified Rankin (Stroke Patients Only)       Balance     Sitting balance-Leahy Scale: Fair       Standing balance-Leahy Scale: Poor                      Cognition Arousal/Alertness: Awake/alert Behavior During Therapy: WFL for tasks assessed/performed Overall Cognitive Status: Within Functional Limits for tasks assessed                      Exercises General Exercises - Lower Extremity Quad Sets: AROM;Both;10 reps;Seated Heel Slides: AROM;Both;10 reps;Seated (Alternating)    General Comments        Pertinent Vitals/Pain Pain Assessment: Faces Faces Pain Scale: Hurts little more Pain Location: L rib cage; spasm-like episodes that last a few minutes Pain Descriptors / Indicators: Grimacing;Spasm Pain Intervention(s): Monitored during session    Home Living                      Prior Function            PT Goals (current goals can now be found in the care plan section) Acute Rehab PT Goals Patient Stated Goal:  get stronger and return home PT Goal Formulation: With patient Time For Goal Achievement: 08/01/15 Potential to Achieve Goals: Fair Progress towards PT goals: Goals met and updated - see care plan    Frequency  Min 3X/week    PT Plan Current plan remains appropriate    Co-evaluation             End of Session Equipment Utilized During Treatment: Gait belt Activity Tolerance: Patient limited by fatigue Patient left: in chair;with call bell/phone within reach;with family/visitor present     Time: 1062-6948 PT Time Calculation (min) (ACUTE ONLY): 26 min  Charges:  $Therapeutic Activity: 23-37 mins                    G Codes:      Quin Hoop 07/18/2015, 10:14 AM  Roney Marion, [place] Pager [phone] Office [phone]

## 2015-07-18 NOTE — Telephone Encounter (Signed)
Spoke with patient wife, aware of the information below. Aware that we will keep her posted and let her know as soon as we hear more from [place] on the status of the claim.

## 2015-07-18 NOTE — Clinical Social Work Note (Signed)
Patient transferred from 3S to [facility]. Handoff information provided to [place] CSW. Plan is still for [employer]. Authorization obtained.  This CSW signing off.  [name], [place]

## 2015-07-18 NOTE — Progress Notes (Signed)
Spoke with pt and wife.  He is still very deconditioned.  He would like one more week to recover prior to placing fistula.  Will schedule for 07/25/15 for left arm AV fistula  Please remove IV from left thumb to reduce risk of infection or injuring veins left arm  Ruta Hinds

## 2015-07-18 NOTE — Progress Notes (Signed)
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 No wall motion abnormalities. LA mildly dilated. No AS or AR.  5/25 LLE Arterial Duplex: No evidence of stenosis or occluded arteries. 5/27- extubated 5/28 off cvvhd 6/4 CorTrak tube removed  Objective:  Vital signs in last 24 hours:  Temp:  [97.7 F (36.5 C)-99.6 F (37.6 C)] 97.7 F (36.5 C) (06/12 0740) Pulse Rate:  [77-87] 86 (06/12 0740) Resp:  [16-18] 16 (06/12 0740) BP: (106-118)/(55-71) 108/71 mmHg (06/12 0740) SpO2:  [97 %-99 %] 98 % (06/12 0740) Weight:  [113.445 kg (250 lb 1.6 oz)] 113.445 kg (250 lb 1.6 oz) (06/11 2033)  Weight change: 1.724 kg (3 lb 12.8 oz) Filed Weights   07/16/15 1854 07/16/15 2104 07/17/15 2033  Weight: 111.721 kg (246 lb 4.8 oz) 112.356 kg (247 lb 11.2 oz) 113.445 kg (250 lb 1.6 oz)    Intake/Output: I/O last 3 completed shifts: In: 23 [P.O.:635] Out: 425 [Urine:425]   Intake/Output this shift:  Total I/O In: 240 [P.O.:240] Out: 100 [Urine:100]  CVS- RRR RS- CTA ABD- BS present soft non-distended EXT- no edema   Basic Metabolic Panel:  Recent Labs Lab 07/14/15 0325 07/15/15 0353 07/16/15 0223 07/17/15 0458 07/18/15 0413  NA 130* 136 133* 137 137  K 4.5 4.2 4.8 3.8 4.2  CL 93* 100* 98* 100* 99*  CO2 27 27 27 30 28   GLUCOSE 86 113* 122* 105* 75  BUN 31* 16 28* 17 30*  CREATININE 3.56*  2.75* 3.63* 2.76* 3.72*  CALCIUM 7.9* 7.9* 7.9* 8.0* 7.9*  PHOS 4.7* 3.9 4.9* 4.5 4.6    Liver Function Tests:  Recent Labs Lab 07/14/15 0325 07/15/15 0353 07/16/15 0223 07/17/15 0458 07/18/15 0413  ALBUMIN 2.0* 1.9* 1.8* 1.8* 1.7*   No results for input(s): LIPASE, AMYLASE in the last 168 hours. No results for input(s): AMMONIA in the last 168 hours.  CBC:  Recent Labs Lab 07/14/15 0325 07/15/15 0353 07/16/15 0223 07/17/15 0458 07/18/15 0413  WBC 6.4 7.3 6.7 5.2 5.1  HGB 8.7* 9.1* 8.8* 8.7* 8.5*  HCT 28.6* 30.7* 29.5* 29.7* 28.8*  MCV 94.1 98.1 95.5 95.8 95.4  PLT 165 153 153 159 145*    Cardiac Enzymes: No results for input(s): CKTOTAL, CKMB, CKMBINDEX, TROPONINI in the last 168 hours.  BNP: Invalid input(s): POCBNP  CBG:  Recent Labs Lab 07/17/15 0739 07/17/15 1127 07/17/15 1639 07/17/15 2032 07/18/15 0735  GLUCAP 99 139* 133* 138* 78    Microbiology: Results for orders placed or performed during the hospital encounter of 05/31/15  Blood culture (routine x 2)     Status: Abnormal   Collection Time: 05/31/15 10:55 AM  Result Value Ref Range Status   Specimen Description BLOOD RIGHT HAND  Final   Special Requests BOTTLES DRAWN AEROBIC AND ANAEROBIC 5CC  Final   Culture  Setup Time   Final  GRAM NEGATIVE RODS IN BOTH AEROBIC AND ANAEROBIC BOTTLES CRITICAL RESULT CALLED TO, READ BACK BY AND VERIFIED WITH: Archie Balboa RN [id number] 05/31/15 A BROWNING    Culture KLEBSIELLA PNEUMONIAE (A)  Final   Report Status 06/04/2015 FINAL  Final   Organism ID, Bacteria KLEBSIELLA PNEUMONIAE  Final      Susceptibility   Klebsiella pneumoniae - MIC*    AMPICILLIN 16 RESISTANT Resistant     CEFAZOLIN <=4 SENSITIVE Sensitive     CEFEPIME <=1 SENSITIVE Sensitive     CEFTAZIDIME <=1 SENSITIVE Sensitive     CEFTRIAXONE <=1 SENSITIVE Sensitive     CIPROFLOXACIN <=0.25 SENSITIVE Sensitive     GENTAMICIN <=1 SENSITIVE Sensitive     IMIPENEM <=0.25 SENSITIVE Sensitive      TRIMETH/SULFA <=20 SENSITIVE Sensitive     AMPICILLIN/SULBACTAM <=2 SENSITIVE Sensitive     PIP/TAZO <=4 SENSITIVE Sensitive     * KLEBSIELLA PNEUMONIAE  Blood culture (routine x 2)     Status: Abnormal   Collection Time: 05/31/15 11:35 AM  Result Value Ref Range Status   Specimen Description BLOOD RIGHT HAND  Final   Special Requests BOTTLES DRAWN AEROBIC ONLY 5CC  Final   Culture  Setup Time   Final    GRAM NEGATIVE RODS AEROBIC BOTTLE ONLY CRITICAL RESULT CALLED TO, READ BACK BY AND VERIFIED WITH: Renette Butters @0026  06/01/15 MKELLY    Culture (A)  Final    KLEBSIELLA PNEUMONIAE SUSCEPTIBILITIES PERFORMED ON PREVIOUS CULTURE WITHIN THE LAST 5 DAYS.    Report Status 06/04/2015 FINAL  Final  Urine culture     Status: Abnormal   Collection Time: 05/31/15 11:58 AM  Result Value Ref Range Status   Specimen Description URINE, RANDOM  Final   Special Requests NONE  Final   Culture MULTIPLE SPECIES PRESENT, SUGGEST RECOLLECTION (A)  Final   Report Status 06/01/2015 FINAL  Final  MRSA PCR Screening     Status: None   Collection Time: 05/31/15  3:44 PM  Result Value Ref Range Status   MRSA by PCR NEGATIVE NEGATIVE Final    Comment:        The GeneXpert MRSA Assay (FDA approved for NASAL specimens only), is one component of a comprehensive MRSA colonization surveillance program. It is not intended to diagnose MRSA infection nor to guide or monitor treatment for MRSA infections.   Culture, blood (routine x 2)     Status: Abnormal   Collection Time: 05/31/15  4:05 PM  Result Value Ref Range Status   Specimen Description BLOOD RIGHT HAND  Final   Special Requests IN PEDIATRIC BOTTLE 2CC  Final   Culture  Setup Time   Final    GRAM NEGATIVE RODS AEROBIC BOTTLE ONLY CRITICAL RESULT CALLED TO, READ BACK BY AND VERIFIED WITH: Renette Butters @0617  06/01/15 MKELLY    Culture (A)  Final    KLEBSIELLA PNEUMONIAE SUSCEPTIBILITIES PERFORMED ON PREVIOUS CULTURE WITHIN THE LAST 5 DAYS.     Report Status 06/04/2015 FINAL  Final  Culture, blood (routine x 2)     Status: Abnormal   Collection Time: 05/31/15  4:15 PM  Result Value Ref Range Status   Specimen Description BLOOD RIGHT ANTECUBITAL  Final   Special Requests IN PEDIATRIC BOTTLE 3CC  Final   Culture  Setup Time   Final    GRAM NEGATIVE RODS AEROBIC BOTTLE ONLY CRITICAL RESULT CALLED TO, READ BACK BY AND VERIFIED WITH: Ferrel Logan ,RN @ [phone] 06/01/15 MKELLY    Culture (A)  Final    KLEBSIELLA PNEUMONIAE SUSCEPTIBILITIES PERFORMED ON PREVIOUS CULTURE WITHIN THE LAST 5 DAYS.    Report Status 06/04/2015 FINAL  Final  Culture, blood (routine x 2)     Status: None   Collection Time: 06/02/15 12:25 AM  Result Value Ref Range Status   Specimen Description BLOOD RIGHT HAND  Final   Special Requests BOTTLES DRAWN AEROBIC AND ANAEROBIC 5CC   Final   Culture NO GROWTH 5 DAYS  Final   Report Status 06/07/2015 FINAL  Final  Culture, blood (routine x 2)     Status: None   Collection Time: 06/02/15 12:25 AM  Result Value Ref Range Status   Specimen Description BLOOD RIGHT ANTECUBITAL  Final   Special Requests BOTTLES DRAWN AEROBIC AND ANAEROBIC 5CC   Final   Culture NO GROWTH 5 DAYS  Final   Report Status 06/07/2015 FINAL  Final  Culture, respiratory (NON-Expectorated)     Status: None   Collection Time: 06/21/15  8:45 PM  Result Value Ref Range Status   Specimen Description BRONCHIAL ALVEOLAR LAVAGE  Final   Special Requests NONE  Final   Gram Stain   Final    ABUNDANT WBC PRESENT,BOTH PMN AND MONONUCLEAR RARE SQUAMOUS EPITHELIAL CELLS PRESENT ABUNDANT GRAM POSITIVE COCCI IN PAIRS IN CLUSTERS Performed at [facility]    Culture   Final    NORMAL OROPHARYNGEAL FLORA Performed at [facility]    Report Status 06/24/2015 FINAL  Final  Culture, blood (routine x 2)     Status: None   Collection Time: 07/02/15 12:02 PM  Result Value Ref Range Status   Specimen Description BLOOD RIGHT ANTECUBITAL  Final    Special Requests BOTTLES DRAWN AEROBIC ONLY 5CC  Final   Culture NO GROWTH 5 DAYS  Final   Report Status 07/07/2015 FINAL  Final  Culture, blood (routine x 2)     Status: None   Collection Time: 07/02/15 12:09 PM  Result Value Ref Range Status   Specimen Description BLOOD RIGHT HAND  Final   Special Requests IN PEDIATRIC BOTTLE 1CC  Final   Culture NO GROWTH 5 DAYS  Final   Report Status 07/07/2015 FINAL  Final  Culture, respiratory (NON-Expectorated)     Status: None   Collection Time: 07/02/15  1:40 PM  Result Value Ref Range Status   Specimen Description TRACHEAL ASPIRATE  Final   Special Requests Normal  Final   Gram Stain   Final    ABUNDANT WBC PRESENT, PREDOMINANTLY PMN NO SQUAMOUS EPITHELIAL CELLS SEEN MODERATE GRAM POSITIVE COCCI IN PAIRS IN CHAINS FEW GRAM POSITIVE RODS RARE GRAM NEGATIVE RODS    Culture Consistent with normal respiratory flora.  Final   Report Status 07/07/2015 FINAL  Final  C difficile quick scan w PCR reflex     Status: None   Collection Time: 07/07/15  8:41 PM  Result Value Ref Range Status   C Diff antigen NEGATIVE NEGATIVE Final   C Diff toxin NEGATIVE NEGATIVE Final   C Diff interpretation Negative for toxigenic C. difficile  Final  MRSA PCR Screening     Status: None   Collection Time: 07/11/15 12:03 PM  Result Value Ref Range Status   MRSA by PCR NEGATIVE NEGATIVE Final    Comment:        The GeneXpert MRSA Assay (FDA approved for NASAL specimens only), is one component of a comprehensive MRSA colonization surveillance program. It is not intended to diagnose MRSA infection nor to  guide or monitor treatment for MRSA infections.     Coagulation Studies:  Recent Labs  07/16/15 0223 07/17/15 0458 07/18/15 0413  LABPROT 32.9* 32.7* 33.3*  INR 3.30* 3.27* 3.36*    Urinalysis: No results for input(s): COLORURINE, LABSPEC, PHURINE, GLUCOSEU, HGBUR, BILIRUBINUR, KETONESUR, PROTEINUR, UROBILINOGEN, NITRITE, LEUKOCYTESUR in the  last 72 hours.  Invalid input(s): APPERANCEUR    Imaging: No results found.   Medications:   . sodium chloride Stopped (07/12/15 1200)   . antiseptic oral rinse  7 mL Mouth Rinse BID  . calcitRIOL  0.25 mcg Oral Q M,W,F  . darbepoetin (ARANESP) injection - DIALYSIS  200 mcg Intravenous Q Tue-HD  . insulin aspart  0-15 Units Subcutaneous TID WC  . insulin aspart  0-5 Units Subcutaneous QHS  . insulin NPH Human  13 Units Subcutaneous BID AC & HS  . midodrine  10 mg Oral TID AC  . multivitamin  1 tablet Oral QHS  . pantoprazole  40 mg Oral Q1200  . tamsulosin  0.4 mg Oral Daily  . Warfarin - Pharmacist Dosing Inpatient   Does not apply q1800   acetaminophen (TYLENOL) oral liquid 160 mg/5 mL, albuterol, guaiFENesin, ondansetron, oxyCODONE, RESOURCE THICKENUP CLEAR, sodium chloride flush  Assessment/ Plan:  Pt adm with septic shock likely due to UTI. Pt with renal failure and on CVVHD. Pt then with Rt perinephric hematoma that required embolization and pt with VDRF. Cardiac arrest 5/3. Reintubated 5/16. Extubated 5/27. PMH - recent (3/28) rt rotator cuff repair, Lt renal cell carcinoma s/p Lt nephrectomy, prostate cancer s/p XRT, PE in 2008, OSA, HTN, HLD, DM, A fib.  1. Acute on CKD 3 Sp HD yest. Good chance he may be at ESRD.  2. Klebsiella bacteremia completed antibiotics  3. Sec HPTH on rocaltrol  4. Anemia on aranesp 5. Hypotension, on midodrine 6. Had chronic indwelling foley PTA          LOS: 28 Man [place] W @TODAY @10 :39 AM

## 2015-07-18 NOTE — Progress Notes (Signed)
[place] TEAM 1 - Stepdown/ICU TEAM  JAMONI LARMORE  [name] DOB: 1940-12-22 DOA: 05/31/2015 PCP: Melinda Crutch    Brief Narrative:  75 y.o. M w/ Hx Pulmonary emboli 2008, DVT, Afib, HTN, Dysrhythmia, HLD, DM2, Prostate CA s/p radiation 2011, and RCC s/p Lt Nephrectomy who was seen in ED 4/22 for clogged catheter which was irrigated and replaced. He apparently has had hematuria for quite some time which has been attributed to scarring from prior radiation.  On 04/25 he went to have his INR checked and it was 8.  He was hypotensive with SBP 70/44. He sent to the ED.  In the ED he was found to have a possible UTI and despite 4.5L IVF he remained hypotensive. He also had multiple metabolic derangements including Na 124, K 5.0, AGMA, lactate 3, SCr 9.36. PCCM completed admission for septic shock due to urosepsis as well as AKI.  Significant Events: 4/25 admitted by [facility] w/ septic shock due to UTI + ARF  4/26 Renal u/s > s/p Lt nephrectomy 4/27 CT abd/pelvis > Rt perinephric hematoma 4/30 [facility] assumed care  5/02 CT abd/pelvis > increased size of hematoma 5/03 Cardiac arrest w/ severe anemia > coil embolization R kidney by IR; PTX post-CPR 5/08 TTE EF 65-70% 5/16 Reintubated due to airway secretions 5/18 Doppler legs b/l > Acute DVT Rt gastrocnemius vein, age indeterminate DVT Rt femoral vein and popliteal vein > heparin gtt 5/23 TTE Moderate LVH - EF 65-70%. No wall motion abnormalities. LA mildly dilated. No AS or AR.  5/25 LLE Arterial Duplex: No evidence of stenosis or occluded arteries. 5/27- extubated 5/28 off cvvhd 6/4 CorTrak tube removed  Subjective: Pt has no new complaints.  He denies cp, sob, n/v or abdom pain.      Assessment & Plan:  Acute on Chronic Renal Failure > now ESRD HD per Nephrology to begin Tu/Th/Sat schedule - permanent access to be placed early next week  - has a tunneled HD cath in L chest   Hemorrhagic Shock / Acute Blood loss Anemia  -secondary to  Rt Perinephric Hematoma - Hgb stable   Cardiac Arrest 5/3 -felt to be due to severe anemia   Pneumothorax after CPR 5/03 -chest tubes removed 5/14 - no resp distress   Right Gastrocnemius vein Acute DVT / DVT Right femoral & popliteal veins -monitor hemoglobin closely secondary to patient's episode of hemorrhagic shock -dosing of warfarin per Pharmacy   Chronic diastolic CHF -no signif overload at this time - volume control per HD  [facility] Weights   07/16/15 1854 07/16/15 2104 07/17/15 2033  Weight: 111.721 kg (246 lb 4.8 oz) 112.356 kg (247 lb 11.2 oz) 113.445 kg (250 lb 1.6 oz)    Ischemic Changes Left Foot  -Arterial duplex w/o notable findings  Chronic A fib Rate controlled - on anticoag w/ warfarin    Severe OSA on CPAP confirmed on sleep study 2009  H/O Left Nephrectomy for RCC  Hx prostate CA s/p radiation 2011  Chronic Hematuria -pt has been refusing scheduled foley flushes - flush foley PRN to prevent clotting of cath  Septic Shock due to Klebsiella Pyelo and Bacteremia  -completed 14 day antibiotic course 5/10  DM2  -5/2 A1C 7.8 - CBG controlled   Acute Encephalopathy - resolved -due to above - resolved   Dysphagia - resolved -Passed swallow evaluation - has now been graduated to thin liquids / D2  Obesity - Body mass index is 41.22 kg/(m^2).  DVT prophylaxis: warfarin  Code Status: FULL CODE Family Communication:  Disposition Plan: eventual d/c to SNF for a rehab stay - need permanent HD access placed prior to d/c - will have to be able to ambulate short distances and sit up in a chair prior to d/c in order to facilitate outpt HD   Consultants:  Nephrology [facility]  PCCM  Antimicrobials:  4/28 Rocephin > 5/10  Objective: Blood pressure 108/71, pulse 86, temperature 97.7 F (36.5 C), temperature source Oral, resp. rate 16, height 5' 9.5" (1.765 m), weight 113.445 kg (250 lb 1.6 oz), SpO2 98 %.  Intake/Output  Summary (Last 24 hours) at 07/18/15 1520 Last data filed at 07/18/15 1354  Gross per 24 hour  Intake    660 ml  Output    200 ml  Net    460 ml   Filed Weights   07/16/15 1854 07/16/15 2104 07/17/15 2033  Weight: 111.721 kg (246 lb 4.8 oz) 112.356 kg (247 lb 11.2 oz) 113.445 kg (250 lb 1.6 oz)    Examination: General: No acute respiratory distress  Lungs: Clear to auscultation B  Cardiovascular: Regular rate without murmur Abdomen: Nontender, overweight, soft, bowel sounds positive Extremities: No cyanosis, or clubbing - 2+ edema bilateral lower extremities   CBC:  Recent Labs Lab 07/14/15 0325 07/15/15 0353 07/16/15 0223 07/17/15 0458 07/18/15 0413  WBC 6.4 7.3 6.7 5.2 5.1  HGB 8.7* 9.1* 8.8* 8.7* 8.5*  HCT 28.6* 30.7* 29.5* 29.7* 28.8*  MCV 94.1 98.1 95.5 95.8 95.4  PLT 165 153 153 159 [id number]*   Basic Metabolic Panel:  Recent Labs Lab 07/14/15 0325 07/15/15 0353 07/16/15 0223 07/17/15 0458 07/18/15 0413  NA 130* 136 133* 137 137  K 4.5 4.2 4.8 3.8 4.2  CL 93* 100* 98* 100* 99*  CO2 27 27 27 30 28   GLUCOSE 86 113* 122* 105* 75  BUN 31* 16 28* 17 30*  CREATININE 3.56* 2.75* 3.63* 2.76* 3.72*  CALCIUM 7.9* 7.9* 7.9* 8.0* 7.9*  PHOS 4.7* 3.9 4.9* 4.5 4.6   GFR: Estimated Creatinine Clearance: 22.1 mL/min (by C-G formula based on Cr of 3.72).  Liver Function Tests:  Recent Labs Lab 07/14/15 0325 07/15/15 0353 07/16/15 0223 07/17/15 0458 07/18/15 0413  ALBUMIN 2.0* 1.9* 1.8* 1.8* 1.7*    Coagulation Profile:  Recent Labs Lab 07/14/15 0325 07/15/15 0353 07/16/15 0223 07/17/15 0458 07/18/15 0413  INR 2.43* 2.88* 3.30* 3.27* 3.36*    HbA1C: HGB A1C MFR BLD  Date/Time Value Ref Range Status  06/07/2015 05:15 PM 7.8* 4.8 - 5.6 % Final    Comment:    (NOTE)         Pre-diabetes: 5.7 - 6.4         Diabetes: >6.4         Glycemic control for adults with diabetes: <7.0   04/29/2015 03:45 PM 8.6* 4.8 - 5.6 % Final    Comment:    (NOTE)          Pre-diabetes: 5.7 - 6.4         Diabetes: >6.4         Glycemic control for adults with diabetes: <7.0     CBG:  Recent Labs Lab 07/17/15 1127 07/17/15 1639 07/17/15 2032 07/18/15 0735 07/18/15 1124  GLUCAP 139* 133* 138* 92 141*    Recent Results (from the past 240 hour(s))  MRSA PCR Screening     Status: None   Collection Time: 07/11/15 12:03 PM  Result Value Ref  Range Status   MRSA by PCR NEGATIVE NEGATIVE Final    Comment:        The GeneXpert MRSA Assay (FDA approved for NASAL specimens only), is one component of a comprehensive MRSA colonization surveillance program. It is not intended to diagnose MRSA infection nor to guide or monitor treatment for MRSA infections.      Scheduled Meds: . antiseptic oral rinse  7 mL Mouth Rinse BID  . calcitRIOL  0.25 mcg Oral Q M,W,F  . darbepoetin (ARANESP) injection - DIALYSIS  200 mcg Intravenous Q Tue-HD  . insulin aspart  0-15 Units Subcutaneous TID WC  . insulin aspart  0-5 Units Subcutaneous QHS  . insulin NPH Human  13 Units Subcutaneous BID AC & HS  . midodrine  10 mg Oral TID AC  . multivitamin  1 tablet Oral QHS  . pantoprazole  40 mg Oral Q1200  . tamsulosin  0.4 mg Oral Daily  . Warfarin - Pharmacist Dosing Inpatient   Does not apply q1800     LOS: 48 days   Time spent: 15 minutes   Cherene Altes, MD [facility] Office  [phone] Pager - Text Page per Amion as per below:  On-Call/Text Page:      [email or web address]      password [email or web address]  If 7PM-7AM, please contact night-coverage [email or web address] Password [email or web address] 07/18/2015, 3:20 PM

## 2015-07-19 LAB — RENAL FUNCTION PANEL
ANION GAP: 9 (ref 5–15)
Albumin: 1.6 g/dL — ABNORMAL LOW (ref 3.5–5.0)
BUN: 43 mg/dL — ABNORMAL HIGH (ref 6–20)
CHLORIDE: 98 mmol/L — AB (ref 101–111)
CO2: 27 mmol/L (ref 22–32)
CREATININE: 4.62 mg/dL — AB (ref 0.61–1.24)
Calcium: 7.9 mg/dL — ABNORMAL LOW (ref 8.9–10.3)
GFR, EST AFRICAN AMERICAN: 13 mL/min — AB (ref >60)
GFR, EST NON AFRICAN AMERICAN: 11 mL/min — AB (ref >60)
Glucose, Bld: 91 mg/dL (ref 65–99)
POTASSIUM: 4.3 mmol/L (ref 3.5–5.1)
Phosphorus: 4.8 mg/dL — ABNORMAL HIGH (ref 2.5–4.6)
Sodium: 134 mmol/L — ABNORMAL LOW (ref 135–145)

## 2015-07-19 LAB — CBC
HEMATOCRIT: 27 % — AB (ref 39.0–52.0)
HEMOGLOBIN: 8.1 g/dL — AB (ref 13.0–17.0)
MCH: 27.9 pg (ref 26.0–34.0)
MCHC: 30 g/dL (ref 30.0–36.0)
MCV: 93.1 fL (ref 78.0–100.0)
PLATELETS: 156 10*3/uL (ref 150–400)
RBC: 2.9 MIL/uL — AB (ref 4.22–5.81)
RDW: 18.4 % — ABNORMAL HIGH (ref 11.5–15.5)
WBC: 4.4 10*3/uL (ref 4.0–10.5)

## 2015-07-19 LAB — PROTIME-INR
INR: 3.5 — ABNORMAL HIGH (ref 0.00–1.49)
Prothrombin Time: 34.3 seconds — ABNORMAL HIGH (ref 11.6–15.2)

## 2015-07-19 LAB — GLUCOSE, CAPILLARY
GLUCOSE-CAPILLARY: 88 mg/dL (ref 65–99)
Glucose-Capillary: 100 mg/dL — ABNORMAL HIGH (ref 65–99)
Glucose-Capillary: 142 mg/dL — ABNORMAL HIGH (ref 65–99)

## 2015-07-19 MED ORDER — ALTEPLASE 2 MG IJ SOLR: 2.0000 mg | Freq: Once | INTRAMUSCULAR | Status: DC | PRN

## 2015-07-19 MED ORDER — SODIUM CHLORIDE 0.9 % IV SOLN: 100.0000 mL | INTRAVENOUS | Status: DC | PRN

## 2015-07-19 MED ORDER — LIDOCAINE-PRILOCAINE 2.5-2.5 % EX CREA: 1.0000 "application " | TOPICAL_CREAM | CUTANEOUS | Status: DC | PRN

## 2015-07-19 MED ORDER — HEPARIN SODIUM (PORCINE) 1000 UNIT/ML DIALYSIS: 1000.0000 [IU] | INTRAMUSCULAR | Status: DC | PRN

## 2015-07-19 MED ORDER — MIDODRINE HCL 5 MG PO TABS
ORAL_TABLET | ORAL | Status: AC
  Filled 2015-07-19: qty 2

## 2015-07-19 MED ORDER — PRO-STAT SUGAR FREE PO LIQD
30.0000 mL | Freq: Three times a day (TID) | ORAL | Status: DC
  Administered 2015-07-19 – 2015-07-31 (×24): 30 mL via ORAL
  Filled 2015-07-19 (×25): qty 30

## 2015-07-19 MED ORDER — PENTAFLUOROPROP-TETRAFLUOROETH EX AERO: 1.0000 "application " | INHALATION_SPRAY | CUTANEOUS | Status: DC | PRN

## 2015-07-19 MED ORDER — DARBEPOETIN ALFA 200 MCG/0.4ML IJ SOSY
PREFILLED_SYRINGE | INTRAMUSCULAR | Status: AC
  Filled 2015-07-19: qty 0.4

## 2015-07-19 MED ORDER — LIDOCAINE HCL (PF) 1 % IJ SOLN: 5.0000 mL | INTRAMUSCULAR | Status: DC | PRN

## 2015-07-19 NOTE — Plan of Care (Signed)
Problem: Food- and Nutrition-Related Knowledge Deficit (NB-1.1) Goal: Nutrition education Formal process to instruct or train a patient/client in a skill or to impart knowledge to help patients/clients voluntarily manage or modify food choices and eating behavior to maintain or improve health. Outcome: Completed/Met Date Met:  07/19/15 Nutrition Education Note  RD consulted for Renal Education. Provided "Eating Healthy with Kidney Disease" handout to patient/family. Reviewed food groups and provided written recommended serving sizes specifically determined for patient's current nutritional status.   Explained why diet restrictions are needed and provided lists of foods to limit/avoid that are high potassium, sodium, and phosphorus. Provided specific recommendations on safer alternatives of these foods. Strongly encouraged compliance of this diet.   Discussed importance of protein intake at each meal and snack. Provided examples of how to maximize protein intake throughout the day. Discussed need for fluid restriction with dialysis and renal-friendly beverage options.Teach back method used.  Expect good compliance.  Corrin Parker, MS, RD, LDN Pager # [phone] After hours/ weekend pager # [phone]

## 2015-07-19 NOTE — Progress Notes (Signed)
[place] TEAM 1 - Stepdown/ICU TEAM  [name]  [name] DOB: 10/29/1940 DOA: 05/31/2015 PCP: Melinda Crutch    Brief Narrative:  75 y.o. M w/ Hx Pulmonary emboli 2008, DVT, Afib, HTN, Dysrhythmia, HLD, DM2, Prostate CA s/p radiation 2011, and RCC s/p Lt Nephrectomy who was seen in ED 4/22 for clogged catheter which was irrigated and replaced. He apparently has had hematuria for quite some time which has been attributed to scarring from prior radiation.  On 04/25 he went to have his INR checked and it was 8.  He was hypotensive with SBP 70/44. He sent to the ED.  In the ED he was found to have a possible UTI and despite 4.5L IVF he remained hypotensive. He also had multiple metabolic derangements including Na 124, K 5.0, AGMA, lactate 3, SCr 9.36. PCCM completed admission for septic shock due to urosepsis as well as AKI.  Significant Events: 4/25 admitted by [facility] w/ septic shock due to UTI + ARF  4/26 Renal u/s > s/p Lt nephrectomy 4/27 CT abd/pelvis > Rt perinephric hematoma 4/30 [facility] assumed care  5/02 CT abd/pelvis > increased size of hematoma 5/03 Cardiac arrest w/ severe anemia > coil embolization R kidney by IR; PTX post-CPR 5/08 TTE EF 65-70% 5/16 Reintubated due to airway secretions 5/18 Doppler legs b/l > Acute DVT Rt gastrocnemius vein, age indeterminate DVT Rt femoral vein and popliteal vein > heparin gtt 5/23 TTE Moderate LVH - EF 65-70%. No wall motion abnormalities. LA mildly dilated. No AS or AR.  5/25 LLE Arterial Duplex: No evidence of stenosis or occluded arteries. 5/27- extubated 5/28 off cvvhd 6/4 CorTrak tube removed  Subjective: Resting comfortably.  No cp, sob, n/v, or abdom pain.       Assessment & Plan:  Acute on Chronic Renal Failure > now ESRD HD per Nephrology to begin Tu/Th/Sat schedule - permanent access to be placed early next week  - has a tunneled HD cath in L chest   Hemorrhagic Shock / Acute Blood loss Anemia  -secondary to Rt  Perinephric Hematoma - Hgb stable but need to monitor as INR is climbing   Cardiac Arrest 5/3 -felt to be due to severe anemia   Pneumothorax after CPR 5/03 -chest tubes removed 5/14 - no resp distress   Right Gastrocnemius vein Acute DVT / DVT Right femoral & popliteal veins -monitor hemoglobin closely secondary to patient's episode of hemorrhagic shock -dosing of warfarin per Pharmacy w/ INR still slowly climbing   Chronic diastolic CHF -no signif overload at this time - volume control per HD  [facility] Weights   07/19/15 0536 07/19/15 0714 07/19/15 1045  Weight: 113.036 kg (249 lb 3.2 oz) 111.3 kg (245 lb 6 oz) 109.2 kg (240 lb 11.9 oz)    Ischemic Changes Left Foot  -Arterial duplex w/o notable findings  Chronic A fib Rate controlled - on anticoag w/ warfarin    Severe OSA on CPAP confirmed on sleep study 2009  H/O Left Nephrectomy for RCC  Hx prostate CA s/p radiation 2011  Chronic Hematuria -pt has been refusing scheduled foley flushes - flush foley PRN to prevent clotting of cath  Septic Shock due to Klebsiella Pyelo and Bacteremia  -completed 14 day antibiotic course 5/10  DM2  -5/2 A1C 7.8 - CBG controlled   Acute Encephalopathy - resolved -due to above - resolved   Dysphagia - resolved -Passed swallow evaluation - has now been graduated to thin liquids / D2  Obesity - Body  mass index is 41.22 kg/(m^2).  DVT prophylaxis: warfarin  Code Status: FULL CODE Family Communication: spoke w/ wife at the bedside  Disposition Plan: eventual d/c to SNF for a rehab stay - need permanent HD access placed prior to d/c - will have to be able to ambulate short distances and sit up in a chair prior to d/c in order to facilitate outpt HD   Consultants:  Nephrology [place] [facility]  PCCM  Antimicrobials:  4/28 Rocephin > 5/10  Objective: Blood pressure 99/60, pulse 101, temperature 98.8 F (37.1 C), temperature source Oral, resp. rate 19,  height 5' 9.5" (1.765 m), weight 109.2 kg (240 lb 11.9 oz), SpO2 97 %.  Intake/Output Summary (Last 24 hours) at 07/19/15 1129 Last data filed at 07/19/15 1045  Gross per 24 hour  Intake    710 ml  Output   1829 ml  Net  -1119 ml   Filed Weights   07/19/15 0536 07/19/15 0714 07/19/15 1045  Weight: 113.036 kg (249 lb 3.2 oz) 111.3 kg (245 lb 6 oz) 109.2 kg (240 lb 11.9 oz)    Examination: General: No acute respiratory distress - alert and pleasant  Lungs: Clear to auscultation bilaterally  Cardiovascular: Regular rate without murmur Abdomen: Nontender, overweight, soft, bowel sounds positive, no rebound  Extremities: No cyanosis, or clubbing - 1+ edema bilateral lower extremities   CBC:  Recent Labs Lab 07/15/15 0353 07/16/15 0223 07/17/15 0458 07/18/15 0413 07/19/15 0344  WBC 7.3 6.7 5.2 5.1 4.4  HGB 9.1* 8.8* 8.7* 8.5* 8.1*  HCT 30.7* 29.5* 29.7* 28.8* 27.0*  MCV 98.1 95.5 95.8 95.4 93.1  PLT 153 153 159 145* [id number]   Basic Metabolic Panel:  Recent Labs Lab 07/15/15 0353 07/16/15 0223 07/17/15 0458 07/18/15 0413 07/19/15 0344  NA 136 133* 137 137 134*  K 4.2 4.8 3.8 4.2 4.3  CL 100* 98* 100* 99* 98*  CO2 27 27 30 28 27   GLUCOSE 113* 122* 105* 75 91  BUN 16 28* 17 30* 43*  CREATININE 2.75* 3.63* 2.76* 3.72* 4.62*  CALCIUM 7.9* 7.9* 8.0* 7.9* 7.9*  PHOS 3.9 4.9* 4.5 4.6 4.8*   GFR: Estimated Creatinine Clearance: 17.5 mL/min (by C-G formula based on Cr of 4.62).  Liver Function Tests:  Recent Labs Lab 07/15/15 0353 07/16/15 0223 07/17/15 0458 07/18/15 0413 07/19/15 0344  ALBUMIN 1.9* 1.8* 1.8* 1.7* 1.6*    Coagulation Profile:  Recent Labs Lab 07/15/15 0353 07/16/15 0223 07/17/15 0458 07/18/15 0413 07/19/15 0344  INR 2.88* 3.30* 3.27* 3.36* 3.50*    HbA1C: HGB A1C MFR BLD  Date/Time Value Ref Range Status  06/07/2015 05:15 PM 7.8* 4.8 - 5.6 % Final    Comment:    (NOTE)         Pre-diabetes: 5.7 - 6.4         Diabetes: >6.4          Glycemic control for adults with diabetes: <7.0   04/29/2015 03:45 PM 8.6* 4.8 - 5.6 % Final    Comment:    (NOTE)         Pre-diabetes: 5.7 - 6.4         Diabetes: >6.4         Glycemic control for adults with diabetes: <7.0     CBG:  Recent Labs Lab 07/17/15 2032 07/18/15 0735 07/18/15 1124 07/18/15 1715 07/18/15 2112  GLUCAP 138* 92 141* 90 134*    Recent Results (from the past 240 hour(s))  MRSA PCR Screening     Status: None   Collection Time: 07/11/15 12:03 PM  Result Value Ref Range Status   MRSA by PCR NEGATIVE NEGATIVE Final    Comment:        The GeneXpert MRSA Assay (FDA approved for NASAL specimens only), is one component of a comprehensive MRSA colonization surveillance program. It is not intended to diagnose MRSA infection nor to guide or monitor treatment for MRSA infections.      Scheduled Meds: . antiseptic oral rinse  7 mL Mouth Rinse BID  . calcitRIOL  0.25 mcg Oral Q M,W,F  . darbepoetin (ARANESP) injection - DIALYSIS  200 mcg Intravenous Q Tue-HD  . feeding supplement (PRO-STAT SUGAR FREE 64)  30 mL Oral BID  . insulin aspart  0-15 Units Subcutaneous TID WC  . insulin aspart  0-5 Units Subcutaneous QHS  . insulin NPH Human  13 Units Subcutaneous BID AC & HS  . midodrine  10 mg Oral TID AC  . multivitamin  1 tablet Oral QHS  . pantoprazole  40 mg Oral Q1200  . tamsulosin  0.4 mg Oral Daily  . Warfarin - Pharmacist Dosing Inpatient   Does not apply q1800     LOS: 49 days   Time spent: 15 minutes   Cherene Altes, MD [facility] Office  [phone] Pager - Text Page per Amion as per below:  On-Call/Text Page:      [email or web address]      password [email or web address]  If 7PM-7AM, please contact night-coverage [email or web address] Password [facility] 07/19/2015, 11:29 AM

## 2015-07-19 NOTE — Procedures (Signed)
I have seen and examined this patient and agree with the plan of care   Patient seen on dialysis  Hb 8.1  Alb 1.6   Phos 4.8  Ca 7.9  [name] W 07/19/2015, 12:38 PM

## 2015-07-19 NOTE — Progress Notes (Signed)
ANTICOAGULATION CONSULT NOTE - Follow Up Consult  Pharmacy Consult for Coumadin Indication: hx afib/DVT/PE and acute DVT  No Known Allergies  Patient Measurements: Height: 5' 9.5" (176.5 cm) Weight: 245 lb 6 oz (111.3 kg) IBW/kg (Calculated) : 71.85   Vital Signs: Temp: 98.2 F (36.8 C) (06/13 0714) Temp Source: Oral (06/13 0714) BP: 91/53 mmHg (06/13 1030) Pulse Rate: 99 (06/13 1030)  Labs:  Recent Labs  07/17/15 0458 07/18/15 0413 07/19/15 0344  HGB 8.7* 8.5* 8.1*  HCT 29.7* 28.8* 27.0*  PLT 159 145* 156  LABPROT 32.7* 33.3* 34.3*  INR 3.27* 3.36* 3.50*  CREATININE 2.76* 3.72* 4.62*    Estimated Creatinine Clearance: 17.7 mL/min (by C-G formula based on Cr of 4.62).  Assessment: 75 yo M on coumadin pta for hx afib, DVT/PE, admitted with hemorrhagic shock, coumadin held, and INR reversed. Hemorrhagic shock resolved and he was started on IV heparin 5/18. Coumadin resumed 5/31. Heparin dc'd 6/6.   INR is still SUPRA therapeutic at 3.5 today - last 3 doses held. Hgb high 8s stable, Plt up 156. Previously with blood in foley bag this admit.  Home PTA dose: 6mg  MWF, 5mg  all other days  Goal of Therapy:  INR 2-3 Monitor platelets by anticoagulation protocol: Yes   Plan:  Hold Coumadin tonight due to elevated INR Monitor daily INR, CBC, clinical course and s/sx of bleeding  Elicia Lamp, PharmD, [facility] Clinical Pharmacist Pager [phone] 07/19/2015 10:48 AM

## 2015-07-19 NOTE — Progress Notes (Signed)
   07/19/15 1350  Clinical Encounter Type  Visited With Patient  Visit Type Spiritual support  Referral From Nurse  Spiritual Encounters  Spiritual Needs Prayer  Stress Factors  Patient Stress Factors Health changes  Chaplain visit made, provided ministry of presence and prayer. Patient preferred visit be brief in order to rest. Advised that additional services available 24/7 upon request.

## 2015-07-19 NOTE — Progress Notes (Signed)
RT placed patient on CPAP of 11 HS. No O2 bleed in needed. Patient tolerating well.

## 2015-07-19 NOTE — Progress Notes (Signed)
Nutrition Follow-up  DOCUMENTATION CODES:   Obesity unspecified  INTERVENTION:  Provide 30 ml Prostat po TID with meals, each supplement provides 100 kcal and 15 grams of protein.   Diet education given.   Encourage adequate PO intake.   NUTRITION DIAGNOSIS:   Inadequate oral intake related to dysphagia as evidenced by meal completion < 25%; improving  GOAL:   Patient will meet greater than or equal to 90% of their needs; progressing  MONITOR:   PO intake, Supplement acceptance, Labs, Weight trends, I & O's  REASON FOR ASSESSMENT:   Ventilator    ASSESSMENT:   75 y.o. male with PMH as outlined below including prostate CA s/p radiation 2011 and RCC s/p left nephrectomy. He was seen in ED 4/22 for clogged catheter which was irrigated and replaced. He apparently has had hematuria for quite some time which has been attributed to scarring from prior radiation.  Pt reports appetite is fine. Meal completion has been varied from 10-75%. Pt is on a dysphagia 2 diet with thin liquids. Pt currently has Prostat ordered and has been consuming them. Pt was increased protein needs. RD to modify prostat orders to TID. Pt is agreeable. RD was additionally consulted for diet education. Pt and wife at bedside were educated.   Phosphorous elevated at 4.8.  Diet Order:  DIET DYS 2 Room service appropriate?: Yes; Fluid consistency:: Thin  Skin:   (+2 generalized edema)  Last BM:  6/10  Height:   Ht Readings from Last 1 Encounters:  07/16/15 5' 9.5" (1.765 m)    Weight:   Wt Readings from Last 1 Encounters:  07/19/15 240 lb 11.9 oz (109.2 kg)    Ideal Body Weight:  72.7 kg  BMI:  Body mass index is 35.05 kg/(m^2).  Estimated Nutritional Needs:   Kcal:  1950-2150  Protein:  110-130 gm  Fluid:  1.2 L  EDUCATION NEEDS:   No education needs identified at this time  Corrin Parker, MS, RD, LDN Pager # [phone] After hours/ weekend pager # [phone]

## 2015-07-20 ENCOUNTER — Inpatient Hospital Stay (HOSPITAL_COMMUNITY): Payer: BLUE CROSS/BLUE SHIELD

## 2015-07-20 DIAGNOSIS — R509 Fever, unspecified: Secondary | ICD-10-CM

## 2015-07-20 DIAGNOSIS — N189 Chronic kidney disease, unspecified: Secondary | ICD-10-CM

## 2015-07-20 LAB — RENAL FUNCTION PANEL
ALBUMIN: 1.6 g/dL — AB (ref 3.5–5.0)
ANION GAP: 8 (ref 5–15)
BUN: 32 mg/dL — AB (ref 6–20)
CHLORIDE: 100 mmol/L — AB (ref 101–111)
CO2: 27 mmol/L (ref 22–32)
Calcium: 7.9 mg/dL — ABNORMAL LOW (ref 8.9–10.3)
Creatinine, Ser: 3.29 mg/dL — ABNORMAL HIGH (ref 0.61–1.24)
GFR, EST AFRICAN AMERICAN: 20 mL/min — AB (ref >60)
GFR, EST NON AFRICAN AMERICAN: 17 mL/min — AB (ref >60)
Glucose, Bld: 102 mg/dL — ABNORMAL HIGH (ref 65–99)
PHOSPHORUS: 3.5 mg/dL (ref 2.5–4.6)
POTASSIUM: 3.7 mmol/L (ref 3.5–5.1)
Sodium: 135 mmol/L (ref 135–145)

## 2015-07-20 LAB — CBC
HEMATOCRIT: 28.4 % — AB (ref 39.0–52.0)
HEMOGLOBIN: 8.3 g/dL — AB (ref 13.0–17.0)
MCH: 27.2 pg (ref 26.0–34.0)
MCHC: 29.2 g/dL — ABNORMAL LOW (ref 30.0–36.0)
MCV: 93.1 fL (ref 78.0–100.0)
Platelets: 155 10*3/uL (ref 150–400)
RBC: 3.05 MIL/uL — AB (ref 4.22–5.81)
RDW: 18.5 % — AB (ref 11.5–15.5)
WBC: 4.1 10*3/uL (ref 4.0–10.5)

## 2015-07-20 LAB — URINALYSIS, ROUTINE W REFLEX MICROSCOPIC
GLUCOSE, UA: NEGATIVE mg/dL
KETONES UR: 15 mg/dL — AB
Nitrite: POSITIVE — AB
Specific Gravity, Urine: 1.021 (ref 1.005–1.030)
pH: 5.5 (ref 5.0–8.0)

## 2015-07-20 LAB — GLUCOSE, CAPILLARY
GLUCOSE-CAPILLARY: 116 mg/dL — AB (ref 65–99)
GLUCOSE-CAPILLARY: 115 mg/dL — AB (ref 65–99)
Glucose-Capillary: 103 mg/dL — ABNORMAL HIGH (ref 65–99)
Glucose-Capillary: 102 mg/dL — ABNORMAL HIGH (ref 65–99)

## 2015-07-20 LAB — URINE MICROSCOPIC-ADD ON

## 2015-07-20 LAB — PROTIME-INR
INR: 3.09 — AB (ref 0.00–1.49)
Prothrombin Time: 31.3 seconds — ABNORMAL HIGH (ref 11.6–15.2)

## 2015-07-20 MED ORDER — SODIUM CHLORIDE 0.9 % IV BOLUS (SEPSIS)
500.0000 mL | Freq: Once | INTRAVENOUS | Status: AC
  Administered 2015-07-20: 500 mL via INTRAVENOUS

## 2015-07-20 MED ORDER — WARFARIN 0.5 MG HALF TABLET
0.5000 mg | ORAL_TABLET | Freq: Once | ORAL | Status: AC
  Administered 2015-07-20: 0.5 mg via ORAL
  Filled 2015-07-20: qty 1

## 2015-07-20 NOTE — Progress Notes (Signed)
Patient has a temp of 101.6,MD on call text paged. Order received via epic,orders carried out. Will continue to monitor. Koralynn Greenspan, Wonda Cheng, Therapist, sports

## 2015-07-20 NOTE — Clinical Social Work Note (Signed)
Patient from [facility] CSW continuing to follow monitor patient's progress and patient is being followed by nephrology and has not been declared end stage renal as of yet.  CSW will assist with discharge back to skilled facility once patient medically stable for discharge.  Maili Shutters Givens, MSW, LCSW Licensed Clinical Social Worker [place] [phone]

## 2015-07-20 NOTE — Progress Notes (Signed)
[facility] ROUNDING NOTE   Subjective:   Interval History:  Still dialysis dependent   4/25 admitted by [facility] w/ septic shock due to UTI + ARF  4/26 Renal u/s > s/p Lt nephrectomy 4/27 CT abd/pelvis > Rt perinephric hematoma 4/30 [facility] assumed care  5/02 CT abd/pelvis > increased size of hematoma 5/03 Cardiac arrest w/ severe anemia > coil embolization R kidney by IR; PTX post-CPR 5/08 TTE EF 65-70% 5/16 Reintubated due to airway secretions 5/18 Doppler legs b/l > Acute DVT Rt gastrocnemius vein, age indeterminate DVT Rt femoral vein and popliteal vein > heparin gtt 5/23 TTE Moderate LVH - EF 65-70%. No wall motion abnormalities. LA mildly dilated. No AS or AR.  5/25 LLE Arterial Duplex: No evidence of stenosis or occluded arteries. 5/27- extubated 5/28 off cvvhd 6/4 CorTrak tube removed  Objective:  Vital signs in last 24 hours:  Temp:  [97.4 F (36.3 C)-102 F (38.9 C)] 102 F (38.9 C) (06/14 0625) Pulse Rate:  [95-105] 101 (06/14 0516) Resp:  [18-21] 18 (06/14 0516) BP: (83-106)/(44-60) 106/58 mmHg (06/14 0516) SpO2:  [96 %-100 %] 96 % (06/14 0516) Weight:  [107 kg (235 lb 14.3 oz)-109.2 kg (240 lb 11.9 oz)] 107 kg (235 lb 14.3 oz) (06/13 2218)  Weight change: -1.736 kg (-3 lb 13.3 oz) Filed Weights   07/19/15 0714 07/19/15 1045 07/19/15 2218  Weight: 111.3 kg (245 lb 6 oz) 109.2 kg (240 lb 11.9 oz) 107 kg (235 lb 14.3 oz)    Intake/Output: I/O last 3 completed shifts: In: 39 [P.O.:460] Out: 1929 [Urine:100; Other:1829]   Intake/Output this shift:     CVS- RRR RS- CTA  Diminished at bases  ABD- BS hypoactive and distended  EXT-  2 + edema    Basic Metabolic Panel:  Recent Labs Lab 07/16/15 0223 07/17/15 0458 07/18/15 0413 07/19/15 0344 07/20/15 0520  NA 133* 137 137 134* 135  K 4.8 3.8 4.2 4.3 3.7  CL 98* 100* 99* 98* 100*  CO2 27 30 28 27 27   GLUCOSE 122* 105* 75 91 102*  BUN 28* 17 30* 43* 32*  CREATININE 3.63* 2.76* 3.72*  4.62* 3.29*  CALCIUM 7.9* 8.0* 7.9* 7.9* 7.9*  PHOS 4.9* 4.5 4.6 4.8* 3.5    Liver Function Tests:  Recent Labs Lab 07/16/15 0223 07/17/15 0458 07/18/15 0413 07/19/15 0344 07/20/15 0520  ALBUMIN 1.8* 1.8* 1.7* 1.6* 1.6*   No results for input(s): LIPASE, AMYLASE in the last 168 hours. No results for input(s): AMMONIA in the last 168 hours.  CBC:  Recent Labs Lab 07/16/15 0223 07/17/15 0458 07/18/15 0413 07/19/15 0344 07/20/15 0520  WBC 6.7 5.2 5.1 4.4 4.1  HGB 8.8* 8.7* 8.5* 8.1* 8.3*  HCT 29.5* 29.7* 28.8* 27.0* 28.4*  MCV 95.5 95.8 95.4 93.1 93.1  PLT 153 159 145* 156 155    Cardiac Enzymes: No results for input(s): CKTOTAL, CKMB, CKMBINDEX, TROPONINI in the last 168 hours.  BNP: Invalid input(s): POCBNP  CBG:  Recent Labs Lab 07/18/15 2112 07/19/15 1143 07/19/15 1639 07/19/15 2044 07/20/15 0751  GLUCAP 134* 88 100* 142* 116*    Microbiology: Results for orders placed or performed during the hospital encounter of 05/31/15  Blood culture (routine x 2)     Status: Abnormal   Collection Time: 05/31/15 10:55 AM  Result Value Ref Range Status   Specimen Description BLOOD RIGHT HAND  Final   Special Requests BOTTLES DRAWN AEROBIC AND ANAEROBIC 5CC  Final   Culture  Setup  Time   Final    GRAM NEGATIVE RODS IN BOTH AEROBIC AND ANAEROBIC BOTTLES CRITICAL RESULT CALLED TO, READ BACK BY AND VERIFIED WITH: Archie Balboa RN [id number] 05/31/15 A BROWNING    Culture KLEBSIELLA PNEUMONIAE (A)  Final   Report Status 06/04/2015 FINAL  Final   Organism ID, Bacteria KLEBSIELLA PNEUMONIAE  Final      Susceptibility   Klebsiella pneumoniae - MIC*    AMPICILLIN 16 RESISTANT Resistant     CEFAZOLIN <=4 SENSITIVE Sensitive     CEFEPIME <=1 SENSITIVE Sensitive     CEFTAZIDIME <=1 SENSITIVE Sensitive     CEFTRIAXONE <=1 SENSITIVE Sensitive     CIPROFLOXACIN <=0.25 SENSITIVE Sensitive     GENTAMICIN <=1 SENSITIVE Sensitive     IMIPENEM <=0.25 SENSITIVE Sensitive      TRIMETH/SULFA <=20 SENSITIVE Sensitive     AMPICILLIN/SULBACTAM <=2 SENSITIVE Sensitive     PIP/TAZO <=4 SENSITIVE Sensitive     * KLEBSIELLA PNEUMONIAE  Blood culture (routine x 2)     Status: Abnormal   Collection Time: 05/31/15 11:35 AM  Result Value Ref Range Status   Specimen Description BLOOD RIGHT HAND  Final   Special Requests BOTTLES DRAWN AEROBIC ONLY 5CC  Final   Culture  Setup Time   Final    GRAM NEGATIVE RODS AEROBIC BOTTLE ONLY CRITICAL RESULT CALLED TO, READ BACK BY AND VERIFIED WITH: Renette Butters @0026  06/01/15 MKELLY    Culture (A)  Final    KLEBSIELLA PNEUMONIAE SUSCEPTIBILITIES PERFORMED ON PREVIOUS CULTURE WITHIN THE LAST 5 DAYS.    Report Status 06/04/2015 FINAL  Final  Urine culture     Status: Abnormal   Collection Time: 05/31/15 11:58 AM  Result Value Ref Range Status   Specimen Description URINE, RANDOM  Final   Special Requests NONE  Final   Culture MULTIPLE SPECIES PRESENT, SUGGEST RECOLLECTION (A)  Final   Report Status 06/01/2015 FINAL  Final  MRSA PCR Screening     Status: None   Collection Time: 05/31/15  3:44 PM  Result Value Ref Range Status   MRSA by PCR NEGATIVE NEGATIVE Final    Comment:        The GeneXpert MRSA Assay (FDA approved for NASAL specimens only), is one component of a comprehensive MRSA colonization surveillance program. It is not intended to diagnose MRSA infection nor to guide or monitor treatment for MRSA infections.   Culture, blood (routine x 2)     Status: Abnormal   Collection Time: 05/31/15  4:05 PM  Result Value Ref Range Status   Specimen Description BLOOD RIGHT HAND  Final   Special Requests IN PEDIATRIC BOTTLE 2CC  Final   Culture  Setup Time   Final    GRAM NEGATIVE RODS AEROBIC BOTTLE ONLY CRITICAL RESULT CALLED TO, READ BACK BY AND VERIFIED WITH: M TOLER,RN @0617  06/01/15 MKELLY    Culture (A)  Final    KLEBSIELLA PNEUMONIAE SUSCEPTIBILITIES PERFORMED ON PREVIOUS CULTURE WITHIN THE LAST 5 DAYS.     Report Status 06/04/2015 FINAL  Final  Culture, blood (routine x 2)     Status: Abnormal   Collection Time: 05/31/15  4:15 PM  Result Value Ref Range Status   Specimen Description BLOOD RIGHT ANTECUBITAL  Final   Special Requests IN PEDIATRIC BOTTLE 3CC  Final   Culture  Setup Time   Final    GRAM NEGATIVE RODS AEROBIC BOTTLE ONLY CRITICAL RESULT CALLED TO, READ BACK BY AND VERIFIED WITH: Ferrel Logan ,RN @ [phone]  06/01/15 MKELLY    Culture (A)  Final    KLEBSIELLA PNEUMONIAE SUSCEPTIBILITIES PERFORMED ON PREVIOUS CULTURE WITHIN THE LAST 5 DAYS.    Report Status 06/04/2015 FINAL  Final  Culture, blood (routine x 2)     Status: None   Collection Time: 06/02/15 12:25 AM  Result Value Ref Range Status   Specimen Description BLOOD RIGHT HAND  Final   Special Requests BOTTLES DRAWN AEROBIC AND ANAEROBIC 5CC   Final   Culture NO GROWTH 5 DAYS  Final   Report Status 06/07/2015 FINAL  Final  Culture, blood (routine x 2)     Status: None   Collection Time: 06/02/15 12:25 AM  Result Value Ref Range Status   Specimen Description BLOOD RIGHT ANTECUBITAL  Final   Special Requests BOTTLES DRAWN AEROBIC AND ANAEROBIC 5CC   Final   Culture NO GROWTH 5 DAYS  Final   Report Status 06/07/2015 FINAL  Final  Culture, respiratory (NON-Expectorated)     Status: None   Collection Time: 06/21/15  8:45 PM  Result Value Ref Range Status   Specimen Description BRONCHIAL ALVEOLAR LAVAGE  Final   Special Requests NONE  Final   Gram Stain   Final    ABUNDANT WBC PRESENT,BOTH PMN AND MONONUCLEAR RARE SQUAMOUS EPITHELIAL CELLS PRESENT ABUNDANT GRAM POSITIVE COCCI IN PAIRS IN CLUSTERS Performed at [facility]    Culture   Final    NORMAL OROPHARYNGEAL FLORA Performed at [facility]    Report Status 06/24/2015 FINAL  Final  Culture, blood (routine x 2)     Status: None   Collection Time: 07/02/15 12:02 PM  Result Value Ref Range Status   Specimen Description BLOOD RIGHT ANTECUBITAL  Final    Special Requests BOTTLES DRAWN AEROBIC ONLY 5CC  Final   Culture NO GROWTH 5 DAYS  Final   Report Status 07/07/2015 FINAL  Final  Culture, blood (routine x 2)     Status: None   Collection Time: 07/02/15 12:09 PM  Result Value Ref Range Status   Specimen Description BLOOD RIGHT HAND  Final   Special Requests IN PEDIATRIC BOTTLE 1CC  Final   Culture NO GROWTH 5 DAYS  Final   Report Status 07/07/2015 FINAL  Final  Culture, respiratory (NON-Expectorated)     Status: None   Collection Time: 07/02/15  1:40 PM  Result Value Ref Range Status   Specimen Description TRACHEAL ASPIRATE  Final   Special Requests Normal  Final   Gram Stain   Final    ABUNDANT WBC PRESENT, PREDOMINANTLY PMN NO SQUAMOUS EPITHELIAL CELLS SEEN MODERATE GRAM POSITIVE COCCI IN PAIRS IN CHAINS FEW GRAM POSITIVE RODS RARE GRAM NEGATIVE RODS    Culture Consistent with normal respiratory flora.  Final   Report Status 07/07/2015 FINAL  Final  C difficile quick scan w PCR reflex     Status: None   Collection Time: 07/07/15  8:41 PM  Result Value Ref Range Status   C Diff antigen NEGATIVE NEGATIVE Final   C Diff toxin NEGATIVE NEGATIVE Final   C Diff interpretation Negative for toxigenic C. difficile  Final  MRSA PCR Screening     Status: None   Collection Time: 07/11/15 12:03 PM  Result Value Ref Range Status   MRSA by PCR NEGATIVE NEGATIVE Final    Comment:        The GeneXpert MRSA Assay (FDA approved for NASAL specimens only), is one component of a comprehensive MRSA colonization surveillance program. It is  not intended to diagnose MRSA infection nor to guide or monitor treatment for MRSA infections.     Coagulation Studies:  Recent Labs  07/18/15 0413 07/19/15 0344 07/20/15 0520  LABPROT 33.3* 34.3* 31.3*  INR 3.36* 3.50* 3.09*    Urinalysis: No results for input(s): COLORURINE, LABSPEC, PHURINE, GLUCOSEU, HGBUR, BILIRUBINUR, KETONESUR, PROTEINUR, UROBILINOGEN, NITRITE, LEUKOCYTESUR in the  last 72 hours.  Invalid input(s): APPERANCEUR    Imaging: No results found.   Medications:   . sodium chloride Stopped (07/12/15 1200)   . antiseptic oral rinse  7 mL Mouth Rinse BID  . calcitRIOL  0.25 mcg Oral Q M,W,F  . darbepoetin (ARANESP) injection - DIALYSIS  200 mcg Intravenous Q Tue-HD  . feeding supplement (PRO-STAT SUGAR FREE 64)  30 mL Oral TID WC  . insulin aspart  0-15 Units Subcutaneous TID WC  . insulin aspart  0-5 Units Subcutaneous QHS  . insulin NPH Human  13 Units Subcutaneous BID AC & HS  . midodrine  10 mg Oral TID AC  . multivitamin  1 tablet Oral QHS  . pantoprazole  40 mg Oral Q1200  . tamsulosin  0.4 mg Oral Daily  . warfarin  0.5 mg Oral ONCE-1800  . Warfarin - Pharmacist Dosing Inpatient   Does not apply q1800   acetaminophen (TYLENOL) oral liquid 160 mg/5 mL, albuterol, guaiFENesin, ondansetron, oxyCODONE, RESOURCE THICKENUP CLEAR, sodium chloride flush  Assessment/ Plan:   Pt adm with septic shock likely due to UTI. Pt with renal failure and on CVVHD. Pt then with Rt perinephric hematoma that required embolization and pt with VDRF. Cardiac arrest 5/3. Reintubated 5/16. Extubated 5/27. PMH - recent (3/28) rt rotator cuff repair, Lt renal cell carcinoma s/p Lt nephrectomy, prostate cancer s/p XRT, PE in 2008, OSA, HTN, HLD, DM, A fib.  1. Acute on CKD 3 Sp HD yest. Good chance he may be at ESRD.  2. Klebsiella bacteremia completed antibiotics  3. Sec HPTH on rocaltrol  4. Anemia on aranesp 5. Hypotension, on midodrine 6. Had chronic indwelling foley PTA              LOS: 30 Ladasia Sircy W @TODAY @9 :55 AM

## 2015-07-20 NOTE — Progress Notes (Signed)
PROGRESS NOTE                                                                                                                                                                                                             Patient Demographics:    Alcindor Eliassen, is a 75 y.o. male, DOB - 1940/11/14, [id number]  Admit date - 05/31/2015   Admitting Physician Rigoberto Noel, MD  Outpatient Primary MD for the patient is ROSS,ALAN  LOS - 15  Outpatient Specialists:   Chief Complaint  Patient presents with  . Hypotension       Brief Narrative   75 year old male with history of A. fib, DVT and PE on Coumadin, type 2 diabetes mellitus, renal cell carcinoma status post left nephrectomy, dysrhythmia, hyperlipidemia, prostatic CA status post radiation 2011 who was seen in the ED on 4/22 4:00 catheter which was irrigated and replaced. He had hematuria for quite some time which was attributed to scarring from prior radiation. However when he had his INR checked on 4/25 it was 8 and patient was found to be hypotensive with blood pressure 70/44 mmHg and sent to the ED. In the ED patient was started to have UTI and despite open 5 L IV bolus he was hypotensive along with sodium of 124, K of 5, lactate of 3 and serum creatinine of 9.36. Patient admitted to ICU for septic shock secondary to UTI and acute kidney injury.  Significant Events: 4/25 admitted by [facility] w/ septic shock due to UTI + ARF  4/26 Renal u/s > s/p Lt nephrectomy 4/27 CT abd/pelvis > Rt perinephric hematoma 4/30 TRH assumed care  5/02 CT abd/pelvis > increased size of hematoma 5/03 Cardiac arrest w/ severe anemia > coil embolization R kidney by IR; PTX post-CPR 5/08 TTE EF 65-70% 5/16 Reintubated due to airway secretions 5/18 Doppler legs b/l > Acute DVT Rt gastrocnemius vein, age indeterminate DVT Rt femoral vein and popliteal vein > heparin gtt 5/23 TTE Moderate LVH - EF  65-70%. No wall motion abnormalities. LA mildly dilated. No AS or AR.  5/25 LLE Arterial Duplex: No evidence of stenosis or occluded arteries. 5/27- extubated 5/28 off cvvhd 6/4 CorTrak tube removed   Subjective:   Patient was febrile with MAXIMUM TEMPERATURE of 102F overnight. Denies any symptoms.   Assessment  & Plan :  Fever on 6/13-14 No clear etiology.? Acute Viral illness. Chest x-ray unremarkable. Check UA, urine cultures and blood cultures. Will hold off on antibiotics at this time.   Acute on chronic kidney disease, now ESRD Has a tunnel is to catheter. Plan on permanent access next week. Hemodialysis onTu/Th/Sat schedule  Nephrology following.  Septic/hemorrhagic shock with acute blood loss anemia Secondary to right perinephric hematoma. Hemoglobin remained stable. Monitor INR closely.  Septic shock due to Klebsiella pyelonephritis and bacteremia. Completed 14 day antibiotic course on 5/10.  Right gastrocnemius vein acute DVT, right femoral and popliteal vein DVTs On Coumadin. Dosing per pharmacy. Monitor H&H closely.  A. fib Date controlled. Continue Coumadin.  Chronic diastolic CHF.  Volume management with dialysis. Euvolemic  Cardiac arrest on 5/3, followed by pneumothorax Suspected to be due to severe anemia.  OSA on CPAP   Chronic hematuria Patient refusing scheduled Foley flushes complaining of some abdominal discomfort. Advised that it is important to prevent catheter from clotting.  Ischemic changes of left foot Arterial duplex without notable findings. Has palpable distal pulses.    Code Status :Full code  Family Communication  : Wife at bedside  Disposition Plan  :  SNF  Barriers For Discharge : pending to be CLIPED  Consults  :   Nephrology [facility] CM Orthopedics Urology Vascular surgery   Procedures  :  See hospital course  DVT Prophylaxis  :  Coumadin  Lab Results  Component Value Date   PLT 155 07/20/2015    Antibiotics   :   IV Rocephin 4/28-5/10  Anti-infectives    Start     Dose/Rate Route Frequency Ordered Stop   06/22/15 2300  vancomycin (VANCOCIN) IVPB 1000 mg/200 mL premix  Status:  Discontinued     1,000 mg 200 mL/hr over 60 Minutes Intravenous Every 24 hours 06/21/15 2118 06/22/15 0921   06/21/15 2300  cefTAZidime (FORTAZ) 2 g in dextrose 5 % 50 mL IVPB  Status:  Discontinued     2 g 100 mL/hr over 30 Minutes Intravenous Every 12 hours 06/21/15 2118 06/22/15 0908   06/21/15 2200  vancomycin (VANCOCIN) 2,000 mg in sodium chloride 0.9 % 500 mL IVPB     2,000 mg 250 mL/hr over 120 Minutes Intravenous  Once 06/21/15 2118 06/22/15 0005   06/17/15 1200  ceFAZolin (ANCEF) 3 g in dextrose 5 % 50 mL IVPB     3 g 130 mL/hr over 30 Minutes Intravenous To Radiology 06/17/15 1156 06/17/15 1251   06/17/15 0924  ceFAZolin (ANCEF) 1-5 GM-% IVPB    Comments:  Covington, Jamie   : cabinet override      06/17/15 0924 06/17/15 1222   06/17/15 0924  ceFAZolin (ANCEF) 2-4 GM/100ML-% IVPB    Comments:  Soyla Dryer   : cabinet override      06/17/15 0924 06/17/15 1222   06/04/15 1100  cefTRIAXone (ROCEPHIN) 2 g in dextrose 5 % 50 mL IVPB     2 g 100 mL/hr over 30 Minutes Intravenous Every 24 hours 06/03/15 1038 06/15/15 1134   06/03/15 1800  cefTAZidime (FORTAZ) 1 g in dextrose 5 % 50 mL IVPB  Status:  Discontinued     1 g 100 mL/hr over 30 Minutes Intravenous Every 24 hours 06/03/15 0737 06/03/15 0956   06/03/15 1100  cefTRIAXone (ROCEPHIN) 1 g in dextrose 5 % 50 mL IVPB  Status:  Discontinued     1 g 100 mL/hr over 30 Minutes Intravenous Every 24 hours 06/03/15 0956 06/03/15  1038   06/02/15 1800  cefTAZidime (FORTAZ) 2 g in dextrose 5 % 50 mL IVPB  Status:  Discontinued     2 g 100 mL/hr over 30 Minutes Intravenous Every 12 hours 06/02/15 1306 06/03/15 0737   05/31/15 1800  cefTAZidime (FORTAZ) 2 g in dextrose 5 % 50 mL IVPB  Status:  Discontinued     2 g 100 mL/hr over 30 Minutes Intravenous Every 48  hours 05/31/15 1444 06/02/15 1306   05/31/15 1130  vancomycin (VANCOCIN) 2,250 mg in sodium chloride 0.9 % 500 mL IVPB  Status:  Discontinued     2,250 mg 250 mL/hr over 120 Minutes Intravenous  Once 05/31/15 1124 05/31/15 1128   05/31/15 1130  vancomycin (VANCOCIN) 2,500 mg in sodium chloride 0.9 % 500 mL IVPB     2,500 mg 250 mL/hr over 120 Minutes Intravenous  Once 05/31/15 1128 05/31/15 1347   05/31/15 1115  piperacillin-tazobactam (ZOSYN) IVPB 3.375 g     3.375 g 100 mL/hr over 30 Minutes Intravenous  Once 05/31/15 1114 05/31/15 1239        Objective:   Filed Vitals:   07/20/15 0516 07/20/15 0625 07/20/15 0910 07/20/15 1112  BP: 106/58  110/60   Pulse: 101  98   Temp: 101.6 F (38.7 C) 102 F (38.9 C) 99.8 F (37.7 C) 97.4 F (36.3 C)  TempSrc:  Oral Oral   Resp: 18  18   Height:      Weight:      SpO2: 96%  98%     Wt Readings from Last 3 Encounters:  07/19/15 107 kg (235 lb 14.3 oz)  05/27/15 120.657 kg (266 lb)  05/10/15 126.735 kg (279 lb 6.4 oz)     Intake/Output Summary (Last 24 hours) at 07/20/15 1438 Last data filed at 07/20/15 1400  Gross per 24 hour  Intake    180 ml  Output    100 ml  Net     80 ml     Physical Exam  Gen: not in distress HEENT: no pallor, moist mucosa, supple neck Chest: clear b/l, no added sounds , is to catheter over left chest CVS: N S1&S2, no murmurs, rubs or gallop GI: soft, NT, ND, BS+ Musculoskeletal: warm, no edema CNS: Alert and oriented, nonfocal    Data Review:    CBC  Recent Labs Lab 07/16/15 0223 07/17/15 0458 07/18/15 0413 07/19/15 0344 07/20/15 0520  WBC 6.7 5.2 5.1 4.4 4.1  HGB 8.8* 8.7* 8.5* 8.1* 8.3*  HCT 29.5* 29.7* 28.8* 27.0* 28.4*  PLT 153 159 145* 156 155  MCV 95.5 95.8 95.4 93.1 93.1  MCH 28.5 28.1 28.1 27.9 27.2  MCHC 29.8* 29.3* 29.5* 30.0 29.2*  RDW 19.3* 18.8* 18.4* 18.4* 18.5*    Chemistries   Recent Labs Lab 07/16/15 0223 07/17/15 0458 07/18/15 0413 07/19/15 0344  07/20/15 0520  NA 133* 137 137 134* 135  K 4.8 3.8 4.2 4.3 3.7  CL 98* 100* 99* 98* 100*  CO2 27 30 28 27 27   GLUCOSE 122* 105* 75 91 102*  BUN 28* 17 30* 43* 32*  CREATININE 3.63* 2.76* 3.72* 4.62* 3.29*  CALCIUM 7.9* 8.0* 7.9* 7.9* 7.9*   ------------------------------------------------------------------------------------------------------------------ No results for input(s): CHOL, HDL, LDLCALC, TRIG, CHOLHDL, LDLDIRECT in the last 72 hours.  Lab Results  Component Value Date   HGBA1C 7.8* 06/07/2015   ------------------------------------------------------------------------------------------------------------------ No results for input(s): TSH, T4TOTAL, T3FREE, THYROIDAB in the last 72 hours.  Invalid input(s): FREET3 ------------------------------------------------------------------------------------------------------------------ No  results for input(s): VITAMINB12, FOLATE, FERRITIN, TIBC, IRON, RETICCTPCT in the last 72 hours.  Coagulation profile  Recent Labs Lab 07/16/15 0223 07/17/15 0458 07/18/15 0413 07/19/15 0344 07/20/15 0520  INR 3.30* 3.27* 3.36* 3.50* 3.09*    No results for input(s): DDIMER in the last 72 hours.  Cardiac Enzymes No results for input(s): CKMB, TROPONINI, MYOGLOBIN in the last 168 hours.  Invalid input(s): CK ------------------------------------------------------------------------------------------------------------------ No results found for: BNP  Inpatient Medications  Scheduled Meds: . antiseptic oral rinse  7 mL Mouth Rinse BID  . calcitRIOL  0.25 mcg Oral Q M,W,F  . darbepoetin (ARANESP) injection - DIALYSIS  200 mcg Intravenous Q Tue-HD  . feeding supplement (PRO-STAT SUGAR FREE 64)  30 mL Oral TID WC  . insulin aspart  0-15 Units Subcutaneous TID WC  . insulin aspart  0-5 Units Subcutaneous QHS  . insulin NPH Human  13 Units Subcutaneous BID AC & HS  . midodrine  10 mg Oral TID AC  . multivitamin  1 tablet Oral QHS  .  pantoprazole  40 mg Oral Q1200  . tamsulosin  0.4 mg Oral Daily  . warfarin  0.5 mg Oral ONCE-1800  . Warfarin - Pharmacist Dosing Inpatient   Does not apply q1800   Continuous Infusions: . sodium chloride Stopped (07/12/15 1200)   PRN Meds:.acetaminophen (TYLENOL) oral liquid 160 mg/5 mL, albuterol, guaiFENesin, ondansetron, oxyCODONE, RESOURCE THICKENUP CLEAR, sodium chloride flush  Micro Results Recent Results (from the past 240 hour(s))  MRSA PCR Screening     Status: None   Collection Time: 07/11/15 12:03 PM  Result Value Ref Range Status   MRSA by PCR NEGATIVE NEGATIVE Final    Comment:        The GeneXpert MRSA Assay (FDA approved for NASAL specimens only), is one component of a comprehensive MRSA colonization surveillance program. It is not intended to diagnose MRSA infection nor to guide or monitor treatment for MRSA infections.     Radiology Reports Dg Shoulder Right  07/07/2015  CLINICAL DATA:  Right shoulder pain. Recent rotator cuff surgery. No known injury. EXAM: RIGHT SHOULDER - 2+ VIEW COMPARISON:  Single-view of the chest 06/19/2015 and 07/06/2015. FINDINGS: There is lucency in the greater tuberosity. There appears to be a small focus of cortical disruption present. Mild appearing acromioclavicular degenerative change is seen. Dialysis catheter and feeding tube are noted. IMPRESSION: Small cortical defect in the greater tuberosity of the right humeral head and increased lucency in the greater tuberosity could be due to infection or related to the patient's recent surgery. Projection is different than on prior chest x-rays but the findings appear more conspicuous. Electronically Signed   By: Inge Rise M.D.   On: 07/07/2015 17:24   Dg Chest Port 1 View  07/20/2015  CLINICAL DATA:  Fevers EXAM: PORTABLE CHEST 1 VIEW COMPARISON:  07/06/2015 FINDINGS: Dialysis catheter is again noted and stable. Cardiac shadow remains enlarged. A feeding catheter is been removed in  the interval. The lungs are clear. Healing left rib fractures are noted. IMPRESSION: No acute abnormality noted. Electronically Signed   By: Inez Catalina M.D.   On: 07/20/2015 10:23   Dg Chest Port 1 View  07/06/2015  CLINICAL DATA:  Acute renal failure. EXAM: PORTABLE CHEST 1 VIEW COMPARISON:  06/27/2015 . FINDINGS: Interim extubation. Dialysis catheter and left subclavian central line in stable position. Feeding tube in stable position. Stable cardiomegaly. Persistent left base subsegmental atelectasis and or infiltrate with slight interim progression. No pleural effusion  or pneumothorax. IMPRESSION: 1. Interim extubation. Right dialysis catheter left subclavian central line stable position. Feeding tube in stable position. 2. Slight interim progression of left base atelectasis and or infiltrate. Electronically Signed   By: [place]   On: 07/06/2015 07:39   Dg Chest Port 1 View  06/27/2015  CLINICAL DATA:  Respiratory failure.  Endotracheal tube placement EXAM: PORTABLE CHEST 1 VIEW COMPARISON:  06/25/2015 FINDINGS: Endotracheal tube in good position. Feeding tube enters the stomach with the tip not visualized. Left subclavian central venous catheter tip in the SVC. Right jugular central venous catheter tip in the SVC. These are unchanged. No pneumothorax. Mild left lower lobe airspace disease is unchanged. Negative for heart failure or pleural effusion. IMPRESSION: Support lines remain in good position and unchanged Left lower lobe atelectasis/infiltrate unchanged. Electronically Signed   By: Franchot Gallo M.D.   On: 06/27/2015 07:10   Dg Chest Port 1 View  06/25/2015  CLINICAL DATA:  Respiratory failure.  Subsequent encounter. EXAM: PORTABLE CHEST 1 VIEW COMPARISON:  06/24/2015 FINDINGS: Endotracheal tube, left subclavian central venous line, right-sided large bore central venous catheter and oral/nasogastric tube are stable and well positioned. Left greater than right lung base opacity is also  stable. Left base opacity may reflect pneumonia, atelectasis or a combination. Right base opacity is most likely atelectasis. Remainder of the lungs is clear. No convincing pleural effusion or pneumothorax. IMPRESSION: 1. No significant change from the previous day's study. 2. Left left lung base opacity consistent with pneumonia, atelectasis or a combination. Mild right basilar atelectasis. 3. Support apparatus is stable and well positioned. Electronically Signed   By: Lajean Manes M.D.   On: 06/25/2015 10:37   Dg Chest Port 1 View  06/24/2015  CLINICAL DATA:  Hypoxia EXAM: PORTABLE CHEST 1 VIEW COMPARISON:  Jun 23, 2015 FINDINGS: Endotracheal tube tip is 2.4 cm above the carina. Both central catheter tips are in the superior vena cava. Feeding tube tip is below the diaphragm. No pneumothorax. There is airspace consolidation in the left lower lobe. Lungs elsewhere clear. Heart is mildly enlarged with pulmonary vascularity within normal limits. No adenopathy. No bone lesions. IMPRESSION: Persistent left lower lobe consolidation. No new opacity. Stable cardiac silhouette. Tube and catheter positions as described without pneumothorax. Electronically Signed   By: Lowella Grip III M.D.   On: 06/24/2015 07:27   Dg Chest Port 1 View  06/23/2015  CLINICAL DATA:  Respiratory failure. EXAM: PORTABLE CHEST 1 VIEW COMPARISON:  06/22/2015. FINDINGS: Feeding tube large caliber right IJ line, left subclavian central line in stable position. Stable cardiomegaly. Low lung volumes with bibasilar atelectasis and/or infiltrates again noted. No pleural effusion or pneumothorax. IMPRESSION: 1. Lines and tubes in stable position. 2. Low lung volumes with bibasilar atelectasis and/or infiltrates again noted without interim change. 3. Stable cardiomegaly. Electronically Signed   By: Marcello Moores  Register   On: 06/23/2015 07:06   Dg Chest Port 1 View  06/22/2015  CLINICAL DATA:  Status post central line placement EXAM: PORTABLE  CHEST 1 VIEW COMPARISON:  06/21/2015 FINDINGS: Dialysis catheter, endotracheal tube and feeding catheter are again seen and stable. A new left subclavian central line is noted with the tip in the proximal superior vena cava. No pneumothorax is seen. Cardiac shadow is stable. The lungs are stable as well. IMPRESSION: No pneumothorax following central line placement. No acute abnormality is seen. Electronically Signed   By: Inez Catalina M.D.   On: 06/22/2015 14:27   Portable Chest Xray  06/21/2015  CLINICAL DATA:  75 year old male status post intubation. EXAM: PORTABLE CHEST 1 VIEW COMPARISON:  Earlier radiograph dated 06/21/2015 FINDINGS: There has been interval placement of an endotracheal tube with tip approximately 2 cm above the carina. Recommend retraction of the tube by 2 cm for optimal positioning. Right-sided dialysis catheter and enteric tube remain in stable positioning. There is minimal left lung base atelectatic changes. There is no focal consolidation, pleural effusion, or pneumothorax. There is stable cardiomegaly. No acute osseous pathology. IMPRESSION: Interval placement of an endotracheal tube with tip approximately 2 cm above the carina. Electronically Signed   By: Anner Crete M.D.   On: 06/21/2015 21:29   Dg Chest Port 1 View  06/21/2015  CLINICAL DATA:  Respiratory failure, septic shock, acute renal failure. EXAM: PORTABLE CHEST 1 VIEW COMPARISON:  Portable chest x-ray of Jun 19, 2015. FINDINGS: The lungs are slightly better inflated today. Persistent retrocardiac density on the left is demonstrated. There is minimal increased density at the right lung base which has improved. The cardiac silhouette remains enlarged. The pulmonary vascularity is not engorged. There is no large pleural effusion and there is no pneumothorax. The dual-lumen dialysis type catheter tip projects over the midportion of the SVC. The feeding tube tip projects below the inferior margin of the image. IMPRESSION:  Improved aeration and decreased interstitial edema in both lungs. Persistent left lower lobe atelectasis or pneumonia. Minimal subsegmental atelectasis at the right lung base. No pneumothorax. Electronically Signed   By: David  Martinique M.D.   On: 06/21/2015 07:19   Dg Swallowing Func-speech Pathology  07/14/2015  Objective Swallowing Evaluation: Type of Study: MBS-Modified Barium Swallow Study Patient Details Name: ADOLPHUS DENSLOW MRN: [id number] Date of Birth: 05-10-1940 Today's Date: 07/14/2015 Time: SLP Start Time (ACUTE ONLY): 1341-SLP Stop Time (ACUTE ONLY): 1350 SLP Time Calculation (min) (ACUTE ONLY): 9 min Past Medical History: Past Medical History Diagnosis Date . Obesity  . Phlebitis    Lower extermity . Pulmonary emboli ([place]) 2008   submassive, saddle . Prostate cancer ([place]) 07/2009 . Sleep apnea    on CPAP . Hx of echocardiogram 12/04/2010   Normal EF >55% no significant valve disease . History of stress test 06/27/2009   Low risk and EF of approximately 50% . DVT (deep venous thrombosis) ([place])  . Chronic kidney disease, stage 3    baseline creatinine ~1.4 . HLD (hyperlipidemia)  . HTN (hypertension)  . Dysrhythmia    A fib . Diabetes mellitus ([place])    diet controlled . History of hiatal hernia  Past Surgical History: Past Surgical History Procedure Laterality Date . Nephrectomy  1999   for CA . Cholecystectomy  1999 . Transurethral resection of bladder tumor N/A 03/04/2013   Procedure: CYSTOSCOPY WITH RIGHT RETROGRADE PYELOGRAM AND BLADDER BIOPSY /CLOT EVACUATION/ BIOPSY PROSTATIC URETHRA WITH FULGERATION ;  Surgeon: Molli Hazard, MD;  Location: [facility];  Service: Urology;  Laterality: N/A; . Cystoscopy w/ retrogrades N/A 04/08/2013   Procedure: CYSTOSCOPY WITH CLOT EVACUATION;  Surgeon: Molli Hazard, MD;  Location: [facility];  Service: Urology;  Laterality: N/A; . Left shoulder repair   . Shoulder open rotator cuff repair Right 05/03/2015   Procedure: RIGHT SHOULDER ROTATOR CUFF REPAIR OPEN WITH  GRAFT AND ANCHOR ;  Surgeon: Latanya Maudlin, MD;  Location: [facility];  Service: Orthopedics;  Laterality: Right; HPI: 75 yo male with hemorrhagic shock leading to cardiac arrest, VDRF, AKI from Rt perinephric hematoma. He was intubated 5/03-5/13, 5/16-5/27. He has hx  of Lt renal cell carcinoma s/p Lt nephrectomy, prostate cancer s/p XRT, PE in 2008, OSA, HTN, HLD, DM, A fib. Subjective: pt alert, pleasant Assessment / Plan / Recommendation CHL IP CLINICAL IMPRESSIONS 07/14/2015 Therapy Diagnosis Mild pharyngeal phase dysphagia;Moderate pharyngeal phase dysphagia Clinical Impression Pt shows improvements in his mild-moderate oropharyngeal dysphagia since previous MBS. Oral phase overall is strong and effective, although he does have piecemeal swallowing particularly with liquids. He continues to have a brief delay in swallow trigger but now with most boluses being contained within the valleculae prior to initiation. The exception is when he consumes thin liquids via straw, as they reach the pyriform sinuses and are subsequently aspirated before the swallow. Cued coughing is productive of secretions mixed with barium, although it is unclear if all aspirates were expelled. A mild-moderate amount of residue still remains with solid textures. Recommend to continue Dys 2 diet but with advancement to thin liquids by cup. Anticipate further improvements as overall strength continues to progress. Impact on safety and function Mild aspiration risk   CHL IP TREATMENT RECOMMENDATION 07/14/2015 Treatment Recommendations Therapy as outlined in treatment plan below   Prognosis 07/14/2015 Prognosis for Safe Diet Advancement Good Barriers to Reach Goals -- Barriers/Prognosis Comment -- CHL IP DIET RECOMMENDATION 07/14/2015 SLP Diet Recommendations Dysphagia 2 (Fine chop) solids;Thin liquid Liquid Administration via Cup;No straw Medication Administration Whole meds with puree Compensations Slow rate;Small sips/bites;Multiple dry swallows after  each bite/sip Postural Changes Remain semi-upright after after feeds/meals (Comment);Seated upright at 90 degrees   CHL IP OTHER RECOMMENDATIONS 07/14/2015 Recommended Consults -- Oral Care Recommendations Oral care BID Other Recommendations Order thickener from pharmacy;Prohibited food (jello, ice cream, thin soups);Remove water pitcher   CHL IP FOLLOW UP RECOMMENDATIONS 07/14/2015 Follow up Recommendations LTACH   CHL IP FREQUENCY AND DURATION 07/14/2015 Speech Therapy Frequency (ACUTE ONLY) min 2x/week Treatment Duration 2 weeks      CHL IP ORAL PHASE 07/14/2015 Oral Phase Impaired Oral - Pudding Teaspoon -- Oral - Pudding Cup -- Oral - Honey Teaspoon -- Oral - Honey Cup -- Oral - Nectar Teaspoon NT Oral - Nectar Cup NT Oral - Nectar Straw NT Oral - Thin Teaspoon NT Oral - Thin Cup Piecemeal swallowing Oral - Thin Straw Piecemeal swallowing Oral - Puree WFL Oral - Mech Soft WFL Oral - Regular -- Oral - Multi-Consistency -- Oral - Pill -- Oral Phase - Comment --  CHL IP PHARYNGEAL PHASE 07/14/2015 Pharyngeal Phase Impaired Pharyngeal- Pudding Teaspoon -- Pharyngeal -- Pharyngeal- Pudding Cup -- Pharyngeal -- Pharyngeal- Honey Teaspoon -- Pharyngeal -- Pharyngeal- Honey Cup -- Pharyngeal -- Pharyngeal- Nectar Teaspoon NT Pharyngeal -- Pharyngeal- Nectar Cup NT Pharyngeal -- Pharyngeal- Nectar Straw NT Pharyngeal -- Pharyngeal- Thin Teaspoon NT Pharyngeal -- Pharyngeal- Thin Cup Delayed swallow initiation-vallecula;Reduced tongue base retraction Pharyngeal Material does not enter airway Pharyngeal- Thin Straw Delayed swallow initiation-pyriform sinuses;Penetration/Aspiration before swallow;Reduced tongue base retraction Pharyngeal Material enters airway, passes BELOW cords without attempt by patient to eject out (silent aspiration) Pharyngeal- Puree Delayed swallow initiation-vallecula;Reduced tongue base retraction;Pharyngeal residue - valleculae Pharyngeal -- Pharyngeal- Mechanical Soft Delayed swallow  initiation-vallecula;Reduced tongue base retraction;Pharyngeal residue - valleculae Pharyngeal -- Pharyngeal- Regular -- Pharyngeal -- Pharyngeal- Multi-consistency -- Pharyngeal -- Pharyngeal- Pill -- Pharyngeal -- Pharyngeal Comment --  CHL IP CERVICAL ESOPHAGEAL PHASE 07/14/2015 Cervical Esophageal Phase WFL Pudding Teaspoon -- Pudding Cup -- Honey Teaspoon -- Honey Cup -- Nectar Teaspoon -- Nectar Cup -- Nectar Straw -- Thin Teaspoon -- Thin Cup -- Thin Straw -- Puree --  Mechanical Soft -- Regular -- Multi-consistency -- Pill -- Cervical Esophageal Comment -- No flowsheet data found. Germain Osgood, M.A. CCC-SLP [phone] Germain Osgood 07/14/2015, 2:47 PM              Dg Swallowing Func-speech Pathology  07/05/2015  Objective Swallowing Evaluation: Type of Study: MBS-Modified Barium Swallow Study Patient Details Name: DAEMEON BRIDWELL MRN: [id number] Date of Birth: May 06, 1940 Today's Date: 07/05/2015 Time: SLP Start Time (ACUTE ONLY): 1051-SLP Stop Time (ACUTE ONLY): 1114 SLP Time Calculation (min) (ACUTE ONLY): 23 min Past Medical History: Past Medical History Diagnosis Date . Obesity  . Phlebitis    Lower extermity . Pulmonary emboli ([place]) 2008   submassive, saddle . Prostate cancer ([place]) 07/2009 . Sleep apnea    on CPAP . Hx of echocardiogram 12/04/2010   Normal EF >55% no significant valve disease . History of stress test 06/27/2009   Low risk and EF of approximately 50% . DVT (deep venous thrombosis) ([place])  . Chronic kidney disease, stage 3    baseline creatinine ~1.4 . HLD (hyperlipidemia)  . HTN (hypertension)  . Dysrhythmia    A fib . Diabetes mellitus ([place])    diet controlled . History of hiatal hernia  Past Surgical History: Past Surgical History Procedure Laterality Date . Nephrectomy  1999   for CA . Cholecystectomy  1999 . Transurethral resection of bladder tumor N/A 03/04/2013   Procedure: CYSTOSCOPY WITH RIGHT RETROGRADE PYELOGRAM AND BLADDER BIOPSY /CLOT EVACUATION/ BIOPSY PROSTATIC URETHRA  WITH FULGERATION ;  Surgeon: Molli Hazard, MD;  Location: [facility];  Service: Urology;  Laterality: N/A; . Cystoscopy w/ retrogrades N/A 04/08/2013   Procedure: CYSTOSCOPY WITH CLOT EVACUATION;  Surgeon: Molli Hazard, MD;  Location: [facility];  Service: Urology;  Laterality: N/A; . Left shoulder repair   . Shoulder open rotator cuff repair Right 05/03/2015   Procedure: RIGHT SHOULDER ROTATOR CUFF REPAIR OPEN WITH GRAFT AND ANCHOR ;  Surgeon: Latanya Maudlin, MD;  Location: [facility];  Service: Orthopedics;  Laterality: Right; HPI: 75 yo male with hemorrhagic shock leading to cardiac arrest, VDRF, AKI from Rt perinephric hematoma. He was intubated 5/03-5/13, 5/16-5/27. He has hx of Lt renal cell carcinoma s/p Lt nephrectomy, prostate cancer s/p XRT, PE in 2008, OSA, HTN, HLD, DM, A fib. Subjective: pt alert, pleasant Assessment / Plan / Recommendation CHL IP CLINICAL IMPRESSIONS 07/05/2015 Therapy Diagnosis Mild oral phase dysphagia;Moderate pharyngeal phase dysphagia Clinical Impression Pt has a mild-moderate oropharyngeal dysphagia with prolonged intubation and generalized deconditioning resulting in sensorimotor deficits. Oral phase is mildly prolonged with reduced bolus cohesion but ultimately with adequate oral clearance. Thin liquids reach the pyriform sinuses prior to swallow trigger and are subsequently aspirated before the swallow. This does elicit a reflexive cough, but it is not sufficient to expel aspirates. Min cues for smaller boluses of thin liquids results in silent penetration, which does clear with cued throat clearing before it can fall beneath the glottis. He has good airway protection with nectar thick liquids and solids, despite larger quantities. Reduced hyolaryngeal movement, epiglottic inversion, and base of tongue retraction result in moderate pharyngeal residue, but this is reduced with Min cues for second swallows. Given the above, would recommend more conservative diet of Dys 2  textures and nectar thick liquids. Good prognosis for diet advancement with increased time post-extubation and improved overall strength. Impact on safety and function Mild aspiration risk   CHL IP TREATMENT RECOMMENDATION 07/05/2015 Treatment Recommendations Therapy as outlined in treatment plan below  Prognosis 07/05/2015 Prognosis for Safe Diet Advancement Good Barriers to Reach Goals -- Barriers/Prognosis Comment -- CHL IP DIET RECOMMENDATION 07/05/2015 SLP Diet Recommendations Dysphagia 2 (Fine chop) solids;Nectar thick liquid Liquid Administration via Cup;Straw Medication Administration Crushed with puree Compensations Minimize environmental distractions;Slow rate;Small sips/bites;Multiple dry swallows after each bite/sip Postural Changes Remain semi-upright after after feeds/meals (Comment);Seated upright at 90 degrees   CHL IP OTHER RECOMMENDATIONS 07/05/2015 Recommended Consults -- Oral Care Recommendations Oral care BID Other Recommendations Order thickener from pharmacy;Prohibited food (jello, ice cream, thin soups);Remove water pitcher   CHL IP FOLLOW UP RECOMMENDATIONS 07/05/2015 Follow up Recommendations LTACH   CHL IP FREQUENCY AND DURATION 07/05/2015 Speech Therapy Frequency (ACUTE ONLY) min 2x/week Treatment Duration 2 weeks      CHL IP ORAL PHASE 07/05/2015 Oral Phase Impaired Oral - Pudding Teaspoon -- Oral - Pudding Cup -- Oral - Honey Teaspoon -- Oral - Honey Cup -- Oral - Nectar Teaspoon Delayed oral transit;Decreased bolus cohesion Oral - Nectar Cup Delayed oral transit;Decreased bolus cohesion Oral - Nectar Straw Delayed oral transit;Decreased bolus cohesion Oral - Thin Teaspoon Delayed oral transit;Decreased bolus cohesion Oral - Thin Cup Delayed oral transit;Decreased bolus cohesion Oral - Thin Straw -- Oral - Puree Delayed oral transit;Decreased bolus cohesion Oral - Mech Soft Delayed oral transit;Decreased bolus cohesion;Impaired mastication Oral - Regular -- Oral - Multi-Consistency -- Oral -  Pill -- Oral Phase - Comment --  CHL IP PHARYNGEAL PHASE 07/05/2015 Pharyngeal Phase Impaired Pharyngeal- Pudding Teaspoon -- Pharyngeal -- Pharyngeal- Pudding Cup -- Pharyngeal -- Pharyngeal- Honey Teaspoon -- Pharyngeal -- Pharyngeal- Honey Cup -- Pharyngeal -- Pharyngeal- Nectar Teaspoon Delayed swallow initiation-vallecula;Reduced epiglottic inversion;Reduced anterior laryngeal mobility;Reduced laryngeal elevation;Reduced tongue base retraction;Pharyngeal residue - valleculae Pharyngeal -- Pharyngeal- Nectar Cup Delayed swallow initiation-vallecula;Reduced epiglottic inversion;Reduced anterior laryngeal mobility;Reduced laryngeal elevation;Reduced tongue base retraction;Pharyngeal residue - valleculae Pharyngeal -- Pharyngeal- Nectar Straw Delayed swallow initiation-vallecula;Reduced epiglottic inversion;Reduced anterior laryngeal mobility;Reduced laryngeal elevation;Reduced tongue base retraction;Pharyngeal residue - valleculae Pharyngeal -- Pharyngeal- Thin Teaspoon Delayed swallow initiation-vallecula;Reduced epiglottic inversion;Reduced anterior laryngeal mobility;Reduced laryngeal elevation;Reduced tongue base retraction;Pharyngeal residue - valleculae Pharyngeal -- Pharyngeal- Thin Cup Reduced epiglottic inversion;Reduced anterior laryngeal mobility;Reduced laryngeal elevation;Reduced tongue base retraction;Pharyngeal residue - valleculae;Delayed swallow initiation-pyriform sinuses;Penetration/Aspiration before swallow Pharyngeal Material enters airway, passes BELOW cords and not ejected out despite cough attempt by patient Pharyngeal- Thin Straw -- Pharyngeal -- Pharyngeal- Puree Delayed swallow initiation-vallecula;Reduced epiglottic inversion;Reduced anterior laryngeal mobility;Reduced laryngeal elevation;Reduced tongue base retraction;Pharyngeal residue - valleculae Pharyngeal -- Pharyngeal- Mechanical Soft Delayed swallow initiation-vallecula;Reduced epiglottic inversion;Reduced anterior laryngeal  mobility;Reduced laryngeal elevation;Reduced tongue base retraction;Pharyngeal residue - valleculae Pharyngeal -- Pharyngeal- Regular -- Pharyngeal -- Pharyngeal- Multi-consistency -- Pharyngeal -- Pharyngeal- Pill -- Pharyngeal -- Pharyngeal Comment --  CHL IP CERVICAL ESOPHAGEAL PHASE 07/05/2015 Cervical Esophageal Phase WFL Pudding Teaspoon -- Pudding Cup -- Honey Teaspoon -- Honey Cup -- Nectar Teaspoon -- Nectar Cup -- Nectar Straw -- Thin Teaspoon -- Thin Cup -- Thin Straw -- Puree -- Mechanical Soft -- Regular -- Multi-consistency -- Pill -- Cervical Esophageal Comment -- No flowsheet data found. Germain Osgood, M.A. CCC-SLP [phone] Germain Osgood 07/05/2015, 12:06 PM               Time Spent in minutes  25   Louellen Molder M.D on 07/20/2015 at 2:38 PM  Between 7am to 7pm - Pager - [phone]  After 7pm go to [email or web address] - password [facility]  [facility] -  Office  [phone]

## 2015-07-20 NOTE — Telephone Encounter (Signed)
[id number]/tct-wife update obtained for any family or patient needs/Leslyn Monda,BSN,RN3,BSN,CN

## 2015-07-20 NOTE — Progress Notes (Signed)
Physical Therapy Treatment Patient Details Name: SEUNG SCHULTHEIS MRN: [id number] DOB: 1941/01/21 Today's Date: 07/20/2015    History of Present Illness Pt adm with septic shock likely due to UTI. Pt with renal failure and on CVVHD. Pt then with Rt perinephric hematoma that required embolization and pt with VDRF. Cardiac arrest 5/3. Reintubated 5/16. Extubated 5/27. PMH - recent (3/28) rt rotator cuff repair, Lt renal cell carcinoma s/p Lt nephrectomy, prostate cancer s/p XRT, PE in 2008, OSA, HTN, HLD, DM, A fib.    PT Comments    Session focused on transfer training, sit<>stand (using Bariatric Stedy and SaraPlus with foot plate removed), and increasing standing tolerance; Noting improvements   Follow Up Recommendations  SNF;Supervision/Assistance - 24 hour     Equipment Recommendations  Rolling walker with 5" wheels;3in1 (PT);Wheelchair (measurements PT);Wheelchair cushion (measurements PT)    Recommendations for Other Services       Precautions / Restrictions Precautions Precautions: Fall Type of Shoulder Precautions: RTC repiar 04/2015 - per Cay Schillings 6/5 pt can use his RUE however he wants within his pain tolerance. Only self ROM    Mobility  Bed Mobility Overal bed mobility: Needs Assistance Bed Mobility: Supine to Sit     Supine to sit: +2 for physical assistance;Mod assist     General bed mobility comments: assist to move LEs off bed and to lift trunk   Transfers Overall transfer level: Needs assistance   Transfers: Sit to/from Stand Sit to Stand: +2 physical assistance;Mod assist         General transfer comment: Stood to [place] stedy x2 with heavy mod assist to translate center of mass forward; cues to push through LEs and fully extend hips for fully upright standing; each standing bout was at least 30 seconds; attempted sit to stand to sink with +2 assist, however pt fatigued and achieved liftoff only x2  Ambulation/Gait                 Stairs             Wheelchair Mobility    Modified Rankin (Stroke Patients Only)       Balance     Sitting balance-Leahy Scale: Fair       Standing balance-Leahy Scale: Poor                      Cognition Arousal/Alertness: Awake/alert Behavior During Therapy: WFL for tasks assessed/performed Overall Cognitive Status: Within Functional Limits for tasks assessed                      Exercises Other Exercises Other Exercises: Sitting in recliner, using fet to push chair backward and pull forward 3 sets of a distance of 5 ft each way    General Comments        Pertinent Vitals/Pain Pain Assessment: Faces Faces Pain Scale: Hurts little more Pain Location: R shoulder and chest/rib cage Pain Descriptors / Indicators: Guarding;Grimacing;Spasm Pain Intervention(s): Monitored during session    Home Living                      Prior Function            PT Goals (current goals can now be found in the care plan section) Acute Rehab PT Goals Patient Stated Goal: get stronger and return home PT Goal Formulation: With patient Time For Goal Achievement: 08/01/15 Potential to Achieve Goals: Fair Progress towards PT goals: Progressing toward  goals    Frequency  Min 3X/week    PT Plan Current plan remains appropriate    Co-evaluation PT/OT/SLP Co-Evaluation/Treatment: Yes Reason for Co-Treatment: Complexity of the patient's impairments (multi-system involvement) PT goals addressed during session: Mobility/safety with mobility;Strengthening/ROM       End of Session Equipment Utilized During Treatment: Gait belt Activity Tolerance: Patient limited by fatigue Patient left: in chair;with call bell/phone within reach;with family/visitor present     Time: [id number] PT Time Calculation (min) (ACUTE ONLY): 60 min  Charges:  $Therapeutic Activity: 23-37 mins                    G Codes:      Quin Hoop 07/20/2015, 1:13 PM  Roney Marion,  [place] Pager [phone] Office [phone]

## 2015-07-20 NOTE — Care Management Note (Signed)
Case Management Note  Patient Details  Name: ARSENE DESHAZIER MRN: [id number] Date of Birth: [birth year]  Subjective/Objective:            CM following for progression and d/c planning.        Action/Plan: 07/20/2015 Pt is SNF resident, and plan is to return to SNF. No HH or DME needs.. Will follow for any changes.   Expected Discharge Date:    07/22/2015              Expected Discharge Plan:  Skilled Nursing Facility  In-House Referral:  Clinical Social Work  Discharge planning Services  CM Consult  Post Acute Care Choice:  NA Choice offered to:  NA  DME Arranged:  N/A DME Agency:  NA  HH Arranged:  NA HH Agency:  NA  Status of Service:  Completed, signed off  Medicare Important Message Given:    Date Medicare IM Given:    Medicare IM give by:    Date Additional Medicare IM Given:    Additional Medicare Important Message give by:     If discussed at [place] of Stay Meetings, dates discussed:    Additional Comments:  Adron Bene, RN 07/20/2015, 4:05 PM

## 2015-07-20 NOTE — Progress Notes (Signed)
Occupational Therapy Treatment Patient Details Name: [name] MRN: [id number] DOB: November 02, 1940 Today's Date: 07/20/2015    History of present illness Pt adm with septic shock likely due to UTI. Pt with renal failure and on CVVHD. Pt then with Rt perinephric hematoma that required embolization and pt with VDRF. Cardiac arrest 5/3. Reintubated 5/16. Extubated 5/27. PMH - recent (3/28) rt rotator cuff repair, Lt renal cell carcinoma s/p Lt nephrectomy, prostate cancer s/p XRT, PE in 2008, OSA, HTN, HLD, DM, A fib.   OT comments  Patient making progress towards OT goals, continue plan of care for now. Pt continues to be limited by fatigue, but willing and motivated to work with therapy.    Follow Up Recommendations  SNF;Supervision/Assistance - 24 hour    Equipment Recommendations  Other (comment) (TBD next venue of care)    Recommendations for Other Services  None at this time   Precautions / Restrictions Precautions Precautions: Fall Type of Shoulder Precautions: RTC repiar 04/2015 - per Cay Schillings 6/5 pt can use his RUE however he wants within his pain tolerance. Only self ROM Required Braces or Orthoses: Sling (per chart, although none in room ) Restrictions Weight Bearing Restrictions: No     Mobility Bed Mobility Overal bed mobility: Needs Assistance Bed Mobility: Supine to Sit     Supine to sit: +2 for physical assistance;Mod assist     General bed mobility comments: assist to move LEs off bed and to lift trunk   Transfers Overall transfer level: Needs assistance   Transfers: Sit to/from Stand Sit to Stand: +2 physical assistance;Mod assist         General transfer comment: Stood to [place] stedy x2 with heavy mod assist to translate center of mass forward; cues to push through LEs and fully extend hips for fully upright standing; each standing bout was at least 30 seconds; attempted sit to stand to sink with +2 assist, however pt fatigued and achieved liftoff only  x2    Balance Overall balance assessment: Needs assistance Sitting-balance support: Feet supported;No upper extremity supported Sitting balance-Leahy Scale: Fair     Standing balance support: Bilateral upper extremity supported;During functional activity Standing balance-Leahy Scale: Poor   ADL Overall ADL's : Needs assistance/impaired Eating/Feeding: Minimal assistance   Grooming: Wash/dry hands;Wash/dry face;Set up;Sitting Grooming Details (indicate cue type and reason): attempted standing, but pt unable to tolerate Upper Body Bathing: Maximal assistance;Sitting Upper Body Bathing Details (indicate cue type and reason): increased assistance due to RUE Lower Body Bathing: Maximal assistance;+2 for physical assistance;Sit to/from stand   Upper Body Dressing : Maximal assistance;Sitting Upper Body Dressing Details (indicate cue type and reason): increased assistance due to RUE Lower Body Dressing: Maximal assistance;Sit to/from stand;+2 for physical assistance   Toilet Transfer: Maximal assistance Toilet Transfer Details (indicate cue type and reason): simulated - using STEDY Toileting- Clothing Manipulation and Hygiene: Total assistance;Bed level         General ADL Comments: Pt found supine in bed. Engaged pt in RUE self ROM exercises (per order), see below under exercises. Pt then engaged in bed mobility and performed sit to stand using STEDY. Assisted pt to recliner from here, then assisted pt to sink while he was seated in recliner. Pt attempted sit to/from stand X2 (only able to stand ~half way). Pt then sat at sink for grooming task of washing face and hands. Pt then performed sit to/from stand using sara + and pt able to stand for 1 minute. Pt takes increased time  and requires encouragement for participation.            Cognition   Behavior During Therapy: WFL for tasks assessed/performed Overall Cognitive Status: Within Functional Limits for tasks assessed       Exercises General Exercises - Upper Extremity Shoulder Flexion: 10 reps;Right;Supine;Self ROM Shoulder Extension: 10 reps;Right;Self ROM;Supine Shoulder Exercises Shoulder External Rotation: Self ROM;Right;10 reps;Supine Other Exercises Other Exercises: Sitting in recliner, using fet to push chair backward and pull forward 3 sets of a distance of 5 ft each way           Pertinent Vitals/ Pain       Pain Assessment: Faces Faces Pain Scale: Hurts little more Pain Location: R shoulder and chest/rib cage Pain Descriptors / Indicators: Guarding;Grimacing;Spasm Pain Intervention(s): Monitored during session   Frequency Min 2X/week     Progress Toward Goals  OT Goals(current goals can now befound in the care plan section)  Progress towards OT goals: Progressing toward goals  Acute Rehab OT Goals Patient Stated Goal: get stronger and return home OT Goal Formulation: With patient Time For Goal Achievement: 08/01/15 Potential to Achieve Goals: Good  Plan Discharge plan remains appropriate    Co-evaluation    PT/OT/SLP Co-Evaluation/Treatment: Yes Reason for Co-Treatment: Complexity of the patient's impairments (multi-system involvement);For patient/therapist safety PT goals addressed during session: Mobility/safety with mobility;Strengthening/ROM OT goals addressed during session: ADL's and self-care;Other (comment) (functional mobility)      End of Session Equipment Utilized During Treatment: Gait belt;Rolling walker   Activity Tolerance Patient limited by fatigue   Patient Left in chair;with call bell/phone within reach;with family/visitor present   Nurse Communication Mobility status     Time: [id number] OT Time Calculation (min): 60 min  Charges: OT General Charges $OT Visit: 1 Procedure OT Treatments $Self Care/Home Management : 8-22 mins $Therapeutic Activity: 8-22 mins  Chrys Racer , MS, OTR/L, CLT Pager: [phone]  07/20/2015, 2:19 PM

## 2015-07-20 NOTE — Progress Notes (Signed)
ANTICOAGULATION CONSULT NOTE - Follow Up Consult  Pharmacy Consult for Coumadin Indication: hx afib/DVT/PE and acute DVT  No Known Allergies  Patient Measurements: Height: 5' 9.5" (176.5 cm) Weight: 235 lb 14.3 oz (107 kg) IBW/kg (Calculated) : 71.85   Vital Signs: Temp: 102 F (38.9 C) (06/14 0625) Temp Source: Oral (06/14 0625) BP: 106/58 mmHg (06/14 0516) Pulse Rate: 101 (06/14 0516)  Labs:  Recent Labs  07/18/15 0413 07/19/15 0344 07/20/15 0520  HGB 8.5* 8.1* 8.3*  HCT 28.8* 27.0* 28.4*  PLT 145* 156 155  LABPROT 33.3* 34.3* 31.3*  INR 3.36* 3.50* 3.09*  CREATININE 3.72* 4.62* 3.29*    Estimated Creatinine Clearance: 24.3 mL/min (by C-G formula based on Cr of 3.29).  Assessment: 75 yo M on coumadin pta for hx afib, DVT/PE, admitted with hemorrhagic shock, coumadin held, and INR reversed. Hemorrhagic shock resolved and he was started on IV heparin 5/18. Coumadin resumed 5/31. Heparin dc'd 6/6.   INR is still slightly SUPRA therapeutic at 3.09 today but now trending down - last 4 doses held. Hgb high 8s stable, Plt up 156. Previously with blood in foley bag this admit, none reported at this time.  Home PTA dose: 6mg  MWF, 5mg  all other days  Goal of Therapy:  INR 2-3 Monitor platelets by anticoagulation protocol: Yes   Plan:  Warfarin 0.5mg  x 1 dose tonight - give small dose to prevent dropping too low Monitor daily INR, CBC, clinical course and s/sx of bleeding  Elicia Lamp, PharmD, [facility] Clinical Pharmacist Pager [phone] 07/20/2015 8:52 AM

## 2015-07-20 NOTE — Progress Notes (Signed)
Placed pt on Cpap 12.0 CMH20 tolerating well.

## 2015-07-21 DIAGNOSIS — Z992 Dependence on renal dialysis: Secondary | ICD-10-CM

## 2015-07-21 DIAGNOSIS — N186 End stage renal disease: Secondary | ICD-10-CM

## 2015-07-21 DIAGNOSIS — I10 Essential (primary) hypertension: Secondary | ICD-10-CM

## 2015-07-21 LAB — CBC
HCT: 27 % — ABNORMAL LOW (ref 39.0–52.0)
HEMOGLOBIN: 8 g/dL — AB (ref 13.0–17.0)
MCH: 27.1 pg (ref 26.0–34.0)
MCHC: 29.6 g/dL — ABNORMAL LOW (ref 30.0–36.0)
MCV: 91.5 fL (ref 78.0–100.0)
Platelets: 177 10*3/uL (ref 150–400)
RBC: 2.95 MIL/uL — AB (ref 4.22–5.81)
RDW: 18.7 % — ABNORMAL HIGH (ref 11.5–15.5)
WBC: 4.4 10*3/uL (ref 4.0–10.5)

## 2015-07-21 LAB — GLUCOSE, CAPILLARY
GLUCOSE-CAPILLARY: 102 mg/dL — AB (ref 65–99)
GLUCOSE-CAPILLARY: 69 mg/dL (ref 65–99)
Glucose-Capillary: 74 mg/dL (ref 65–99)
Glucose-Capillary: 91 mg/dL (ref 65–99)
Glucose-Capillary: 99 mg/dL (ref 65–99)

## 2015-07-21 LAB — RENAL FUNCTION PANEL
ALBUMIN: 1.5 g/dL — AB (ref 3.5–5.0)
ANION GAP: 8 (ref 5–15)
BUN: 51 mg/dL — ABNORMAL HIGH (ref 6–20)
CALCIUM: 8 mg/dL — AB (ref 8.9–10.3)
CO2: 29 mmol/L (ref 22–32)
Chloride: 100 mmol/L — ABNORMAL LOW (ref 101–111)
Creatinine, Ser: 4.43 mg/dL — ABNORMAL HIGH (ref 0.61–1.24)
GFR calc non Af Amer: 12 mL/min — ABNORMAL LOW (ref >60)
GFR, EST AFRICAN AMERICAN: 14 mL/min — AB (ref >60)
Glucose, Bld: 73 mg/dL (ref 65–99)
PHOSPHORUS: 4 mg/dL (ref 2.5–4.6)
Potassium: 4.3 mmol/L (ref 3.5–5.1)
SODIUM: 137 mmol/L (ref 135–145)

## 2015-07-21 LAB — PROTIME-INR
INR: 3.87 — AB (ref 0.00–1.49)
Prothrombin Time: 37.1 seconds — ABNORMAL HIGH (ref 11.6–15.2)

## 2015-07-21 MED ORDER — LIDOCAINE HCL (PF) 1 % IJ SOLN: 5.0000 mL | INTRAMUSCULAR | Status: DC | PRN

## 2015-07-21 MED ORDER — PENTAFLUOROPROP-TETRAFLUOROETH EX AERO: 1.0000 "application " | INHALATION_SPRAY | CUTANEOUS | Status: DC | PRN

## 2015-07-21 MED ORDER — NA FERRIC GLUC CPLX IN SUCROSE 12.5 MG/ML IV SOLN
125.0000 mg | Freq: Every day | INTRAVENOUS | Status: AC
  Administered 2015-07-21 – 2015-07-25 (×5): 125 mg via INTRAVENOUS
  Filled 2015-07-21 (×10): qty 10

## 2015-07-21 MED ORDER — LIDOCAINE-PRILOCAINE 2.5-2.5 % EX CREA: 1.0000 "application " | TOPICAL_CREAM | CUTANEOUS | Status: DC | PRN

## 2015-07-21 MED ORDER — HEPARIN SODIUM (PORCINE) 1000 UNIT/ML DIALYSIS: 1000.0000 [IU] | INTRAMUSCULAR | Status: DC | PRN

## 2015-07-21 MED ORDER — SODIUM CHLORIDE 0.9 % IV SOLN: 100.0000 mL | INTRAVENOUS | Status: DC | PRN

## 2015-07-21 MED ORDER — LORAZEPAM 1 MG PO TABS
1.0000 mg | ORAL_TABLET | Freq: Once | ORAL | Status: AC
  Administered 2015-07-22: 1 mg via ORAL
  Filled 2015-07-21: qty 1

## 2015-07-21 MED ORDER — ALTEPLASE 2 MG IJ SOLR: 2.0000 mg | Freq: Once | INTRAMUSCULAR | Status: DC | PRN

## 2015-07-21 NOTE — Progress Notes (Signed)
ANTICOAGULATION CONSULT NOTE   Pharmacy Consult: heparin when INR < 2  Indication: hx afib/DVT/PE and acute DVT  No Known Allergies  Patient Measurements: Height: 5' 9.5" (176.5 cm) Weight: 235 lb 14.3 oz (107 kg) IBW/kg (Calculated) : 71.85   Vital Signs: Temp: 97.9 F (36.6 C) (06/15 0544) Temp Source: Oral (06/15 0544) BP: 98/66 mmHg (06/15 0544) Pulse Rate: 94 (06/15 0544)  Labs:  Recent Labs  07/19/15 0344 07/20/15 0520 07/21/15 0644  HGB 8.1* 8.3* 8.0*  HCT 27.0* 28.4* 27.0*  PLT 156 155 177  LABPROT 34.3* 31.3* 37.1*  INR 3.50* 3.09* 3.87*  CREATININE 4.62* 3.29* 4.43*    Estimated Creatinine Clearance: 18 mL/min (by C-G formula based on Cr of 4.43).  Assessment: 75 yo M on coumadin pta for hx afib, DVT/PE, admitted with hemorrhagic shock, coumadin held, and INR reversed. Hemorrhagic shock resolved and he was started on IV heparin 5/18. Coumadin resumed 5/31. Heparin dc'd 6/6.  INR 3.87 this morning and CBC stable. With plans for AV fistula on Monday (6/19) plan to stop warfarin and bridge with heparin until procedure. Will start heparin when INR  < 2.    Home PTA dose: 6mg  MWF, 5mg  all other days  Goal of Therapy:  INR 2-3 Monitor platelets by anticoagulation protocol: Yes   Plan:  1. INR remains elevated today and will not start heparin; will order an INR for 6/16 am and start heparin with no bolus when INR  < 2  2. CBC and INR in am   Vincenza Hews, PharmD, BCPS 07/21/2015, 9:23 AM Pager: [phone]

## 2015-07-21 NOTE — Consult Note (Signed)
Pt for AV fistula on Monday Will need to d/c coumadin today and bridge with heparin  Raymond Hinds, MD [facility]: [phone] Pager: [phone]

## 2015-07-21 NOTE — Procedures (Signed)
Patient was seen on dialysis and the procedure was supervised.  BFR 200  Via PC(just getting started)  BP is  103/84.   Patient appears to be tolerating treatment well  [name] A 07/21/2015

## 2015-07-21 NOTE — Progress Notes (Signed)
PROGRESS NOTE                                                                                                                                                                                                             Patient Demographics:    [name], is a 75 y.o. male, DOB - Nov 20, 1940, [id number]  Admit date - 05/31/2015   Admitting Physician Rigoberto Noel, MD  Outpatient Primary MD for the patient is [facility]  LOS - 18  Outpatient Specialists:   Chief Complaint  Patient presents with  . Hypotension       Brief Narrative   75 year old male with history of A. fib, DVT and PE on Coumadin, type 2 diabetes mellitus, renal cell carcinoma status post left nephrectomy, dysrhythmia, hyperlipidemia, prostatic CA status post radiation 2011 who was seen in the ED on 4/22 4:00 catheter which was irrigated and replaced. He had hematuria for quite some time which was attributed to scarring from prior radiation. However when he had his INR checked on 4/25 it was 8 and patient was found to be hypotensive with blood pressure 70/44 mmHg and sent to the ED. In the ED patient was started to have UTI and despite open 5 L IV bolus he was hypotensive along with sodium of 124, K of 5, lactate of 3 and serum creatinine of 9.36. Patient admitted to ICU for septic shock secondary to UTI and acute kidney injury.  Significant Events: 4/25 admitted by [facility] w/ septic shock due to UTI + ARF  4/26 Renal u/s > s/p Lt nephrectomy 4/27 CT abd/pelvis > Rt perinephric hematoma 4/30 [facility] assumed care  5/02 CT abd/pelvis > increased size of hematoma 5/03 Cardiac arrest w/ severe anemia > coil embolization R kidney by IR; PTX post-CPR 5/08 TTE EF 65-70% 5/16 Reintubated due to airway secretions 5/18 Doppler legs b/l > Acute DVT Rt gastrocnemius vein, age indeterminate DVT Rt femoral vein and popliteal vein > heparin gtt 5/23 TTE Moderate LVH - EF  65-70%. No wall motion abnormalities. LA mildly dilated. No AS or AR.  5/25 LLE Arterial Duplex: No evidence of stenosis or occluded arteries. 5/27- extubated 5/28 off cvvhd 6/4 CorTrak tube removed   Subjective:   Rinse afebrile. No overnight issues.   Assessment  & Plan :   Fever on 6/13-14 .? Acute Viral  illness. UA does show numerous WBCs. However given his chronic Foley this might be colonization  Will follow-up with culture. Has remained afebrile. Follow blood cultures.    Acute on chronic kidney disease, now ESRD  Has a tunneled catheter. Plan on permanent access next week. Hemodialysis onTu/Th/Sat schedule  Nephrology following. He is now long term HD   per renal. Needs to be CLIPed  Septic/hemorrhagic shock with acute blood loss anemia Secondary to right perinephric hematoma. Hemoglobin remained stable. Monitor INR closely.  Septic shock due to Klebsiella pyelonephritis and bacteremia. Completed 14 day antibiotic course on 5/10.  Right gastrocnemius vein acute DVT, right femoral and popliteal vein DVTs On Coumadin. Dosing per pharmacy. INR supratherapeutic. Monitor H&H closely.  A. fib rate controlled. Continue Coumadin.  Chronic diastolic CHF.  Volume management with dialysis. Euvolemic  Cardiac arrest on 5/3, followed by pneumothorax Suspected to be due to severe anemia.  OSA on CPAP   Chronic hematuria Patient refusing scheduled Foley flushes complaining of some abdominal discomfort. Advised that it is important to prevent catheter from clotting.  Ischemic changes of left foot Arterial duplex without notable findings. Has palpable distal pulses.    Code Status :Full code  Family Communication  : Wife at bedside  Disposition Plan  :  SNF  Barriers For Discharge : pending to be CLIPED  Consults  :   Nephrology [facility]oped[facility] Urology Vascular surgery   Procedures  :  See hospital course  DVT Prophylaxis  :  Coumadin  Lab Results    Component Value Date   PLT 177 07/21/2015    Antibiotics  :   IV Rocephin 4/28-5/10  Anti-infectives    Start     Dose/Rate Route Frequency Ordered Stop   06/22/15 2300  vancomycin (VANCOCIN) IVPB 1000 mg/200 mL premix  Status:  Discontinued     1,000 mg 200 mL/hr over 60 Minutes Intravenous Every 24 hours 06/21/15 2118 06/22/15 0921   06/21/15 2300  cefTAZidime (FORTAZ) 2 g in dextrose 5 % 50 mL IVPB  Status:  Discontinued     2 g 100 mL/hr over 30 Minutes Intravenous Every 12 hours 06/21/15 2118 06/22/15 0908   06/21/15 2200  vancomycin (VANCOCIN) 2,000 mg in sodium chloride 0.9 % 500 mL IVPB     2,000 mg 250 mL/hr over 120 Minutes Intravenous  Once 06/21/15 2118 06/22/15 0005   06/17/15 1200  ceFAZolin (ANCEF) 3 g in dextrose 5 % 50 mL IVPB     3 g 130 mL/hr over 30 Minutes Intravenous To Radiology 06/17/15 1156 06/17/15 1251   06/17/15 0924  ceFAZolin (ANCEF) 1-5 GM-% IVPB    Comments:  Covington, Jamie   : cabinet override      06/17/15 0924 06/17/15 1222   06/17/15 0924  ceFAZolin (ANCEF) 2-4 GM/100ML-% IVPB    Comments:  Soyla Dryer   : cabinet override      06/17/15 0924 06/17/15 1222   06/04/15 1100  cefTRIAXone (ROCEPHIN) 2 g in dextrose 5 % 50 mL IVPB     2 g 100 mL/hr over 30 Minutes Intravenous Every 24 hours 06/03/15 1038 06/15/15 1134   06/03/15 1800  cefTAZidime (FORTAZ) 1 g in dextrose 5 % 50 mL IVPB  Status:  Discontinued     1 g 100 mL/hr over 30 Minutes Intravenous Every 24 hours 06/03/15 0737 06/03/15 0956   06/03/15 1100  cefTRIAXone (ROCEPHIN) 1 g in dextrose 5 % 50 mL IVPB  Status:  Discontinued  1 g 100 mL/hr over 30 Minutes Intravenous Every 24 hours 06/03/15 0956 06/03/15 1038   06/02/15 1800  cefTAZidime (FORTAZ) 2 g in dextrose 5 % 50 mL IVPB  Status:  Discontinued     2 g 100 mL/hr over 30 Minutes Intravenous Every 12 hours 06/02/15 1306 06/03/15 0737   05/31/15 1800  cefTAZidime (FORTAZ) 2 g in dextrose 5 % 50 mL IVPB  Status:   Discontinued     2 g 100 mL/hr over 30 Minutes Intravenous Every 48 hours 05/31/15 1444 06/02/15 1306   05/31/15 1130  vancomycin (VANCOCIN) 2,250 mg in sodium chloride 0.9 % 500 mL IVPB  Status:  Discontinued     2,250 mg 250 mL/hr over 120 Minutes Intravenous  Once 05/31/15 1124 05/31/15 1128   05/31/15 1130  vancomycin (VANCOCIN) 2,500 mg in sodium chloride 0.9 % 500 mL IVPB     2,500 mg 250 mL/hr over 120 Minutes Intravenous  Once 05/31/15 1128 05/31/15 1347   05/31/15 1115  piperacillin-tazobactam (ZOSYN) IVPB 3.375 g     3.375 g 100 mL/hr over 30 Minutes Intravenous  Once 05/31/15 1114 05/31/15 1239        Objective:   Filed Vitals:   07/20/15 1633 07/20/15 2108 07/21/15 0544 07/21/15 0929  BP: 126/72 115/73 98/66 103/84  Pulse: 91 99 94 98  Temp: 98.8 F (37.1 C) 98.5 F (36.9 C) 97.9 F (36.6 C) 98.5 F (36.9 C)  TempSrc: Oral Oral Oral Oral  Resp: 18 18 19 20   Height:      Weight:      SpO2: 99% 98% 100% 98%    Wt Readings from Last 3 Encounters:  07/19/15 107 kg (235 lb 14.3 oz)  05/27/15 120.657 kg (266 lb)  05/10/15 126.735 kg (279 lb 6.4 oz)     Intake/Output Summary (Last 24 hours) at 07/21/15 1400 Last data filed at 07/21/15 1016  Gross per 24 hour  Intake      0 ml  Output    850 ml  Net   -850 ml     Physical Exam  Gen: not in distress HEENT:  moist mucosa, supple neck Chest: clear b/l, no added sounds , HD catheter over left chest CVS: N S1&S2, no murmurs, rubs or gallop GI: soft, NT, ND, BS+, Chronic foley Musculoskeletal: warm, no edema CNS: Alert and oriented, nonfocal    Data Review:    CBC  Recent Labs Lab 07/17/15 0458 07/18/15 0413 07/19/15 0344 07/20/15 0520 07/21/15 0644  WBC 5.2 5.1 4.4 4.1 4.4  HGB 8.7* 8.5* 8.1* 8.3* 8.0*  HCT 29.7* 28.8* 27.0* 28.4* 27.0*  PLT 159 145* 156 155 177  MCV 95.8 95.4 93.1 93.1 91.5  MCH 28.1 28.1 27.9 27.2 27.1  MCHC 29.3* 29.5* 30.0 29.2* 29.6*  RDW 18.8* 18.4* 18.4* 18.5* 18.7*     Chemistries   Recent Labs Lab 07/17/15 0458 07/18/15 0413 07/19/15 0344 07/20/15 0520 07/21/15 0644  NA 137 137 134* 135 137  K 3.8 4.2 4.3 3.7 4.3  CL 100* 99* 98* 100* 100*  CO2 30 28 27 27 29   GLUCOSE 105* 75 91 102* 73  BUN 17 30* 43* 32* 51*  CREATININE 2.76* 3.72* 4.62* 3.29* 4.43*  CALCIUM 8.0* 7.9* 7.9* 7.9* 8.0*   ------------------------------------------------------------------------------------------------------------------ No results for input(s): CHOL, HDL, LDLCALC, TRIG, CHOLHDL, LDLDIRECT in the last 72 hours.  Lab Results  Component Value Date   HGBA1C 7.8* 06/07/2015   ------------------------------------------------------------------------------------------------------------------ No results for input(s): TSH,  T4TOTAL, T3FREE, THYROIDAB in the last 72 hours.  Invalid input(s): FREET3 ------------------------------------------------------------------------------------------------------------------ No results for input(s): VITAMINB12, FOLATE, FERRITIN, TIBC, IRON, RETICCTPCT in the last 72 hours.  Coagulation profile  Recent Labs Lab 07/17/15 0458 07/18/15 0413 07/19/15 0344 07/20/15 0520 07/21/15 0644  INR 3.27* 3.36* 3.50* 3.09* 3.87*    No results for input(s): DDIMER in the last 72 hours.  Cardiac Enzymes No results for input(s): CKMB, TROPONINI, MYOGLOBIN in the last 168 hours.  Invalid input(s): CK ------------------------------------------------------------------------------------------------------------------ No results found for: BNP  Inpatient Medications  Scheduled Meds: . antiseptic oral rinse  7 mL Mouth Rinse BID  . calcitRIOL  0.25 mcg Oral Q M,W,F  . darbepoetin (ARANESP) injection - DIALYSIS  200 mcg Intravenous Q Tue-HD  . feeding supplement (PRO-STAT SUGAR FREE 64)  30 mL Oral TID WC  . ferric gluconate (FERRLECIT/NULECIT) IV  125 mg Intravenous Daily  . insulin aspart  0-15 Units Subcutaneous TID WC  . insulin  aspart  0-5 Units Subcutaneous QHS  . insulin NPH Human  13 Units Subcutaneous BID AC & HS  . midodrine  10 mg Oral TID AC  . multivitamin  1 tablet Oral QHS  . pantoprazole  40 mg Oral Q1200  . Warfarin - Pharmacist Dosing Inpatient   Does not apply q1800   Continuous Infusions: . sodium chloride Stopped (07/12/15 1200)   PRN Meds:.acetaminophen (TYLENOL) oral liquid 160 mg/5 mL, albuterol, guaiFENesin, ondansetron, oxyCODONE, RESOURCE THICKENUP CLEAR, sodium chloride flush  Micro Results No results found for this or any previous visit (from the past 240 hour(s)).  Radiology Reports Dg Shoulder Right  07/07/2015  CLINICAL DATA:  Right shoulder pain. Recent rotator cuff surgery. No known injury. EXAM: RIGHT SHOULDER - 2+ VIEW COMPARISON:  Single-view of the chest 06/19/2015 and 07/06/2015. FINDINGS: There is lucency in the greater tuberosity. There appears to be a small focus of cortical disruption present. Mild appearing acromioclavicular degenerative change is seen. Dialysis catheter and feeding tube are noted. IMPRESSION: Small cortical defect in the greater tuberosity of the right humeral head and increased lucency in the greater tuberosity could be due to infection or related to the patient's recent surgery. Projection is different than on prior chest x-rays but the findings appear more conspicuous. Electronically Signed   By: Inge Rise M.D.   On: 07/07/2015 17:24   Dg Chest Port 1 View  07/20/2015  CLINICAL DATA:  Fevers EXAM: PORTABLE CHEST 1 VIEW COMPARISON:  07/06/2015 FINDINGS: Dialysis catheter is again noted and stable. Cardiac shadow remains enlarged. A feeding catheter is been removed in the interval. The lungs are clear. Healing left rib fractures are noted. IMPRESSION: No acute abnormality noted. Electronically Signed   By: Inez Catalina M.D.   On: 07/20/2015 10:23   Dg Chest Port 1 View  07/06/2015  CLINICAL DATA:  Acute renal failure. EXAM: PORTABLE CHEST 1 VIEW  COMPARISON:  06/27/2015 . FINDINGS: Interim extubation. Dialysis catheter and left subclavian central line in stable position. Feeding tube in stable position. Stable cardiomegaly. Persistent left base subsegmental atelectasis and or infiltrate with slight interim progression. No pleural effusion or pneumothorax. IMPRESSION: 1. Interim extubation. Right dialysis catheter left subclavian central line stable position. Feeding tube in stable position. 2. Slight interim progression of left base atelectasis and or infiltrate. Electronically Signed   By: [place]   On: 07/06/2015 07:39   Dg Chest Port 1 View  06/27/2015  CLINICAL DATA:  Respiratory failure.  Endotracheal tube placement EXAM: PORTABLE CHEST  1 VIEW COMPARISON:  06/25/2015 FINDINGS: Endotracheal tube in good position. Feeding tube enters the stomach with the tip not visualized. Left subclavian central venous catheter tip in the SVC. Right jugular central venous catheter tip in the SVC. These are unchanged. No pneumothorax. Mild left lower lobe airspace disease is unchanged. Negative for heart failure or pleural effusion. IMPRESSION: Support lines remain in good position and unchanged Left lower lobe atelectasis/infiltrate unchanged. Electronically Signed   By: Franchot Gallo M.D.   On: 06/27/2015 07:10   Dg Chest Port 1 View  06/25/2015  CLINICAL DATA:  Respiratory failure.  Subsequent encounter. EXAM: PORTABLE CHEST 1 VIEW COMPARISON:  06/24/2015 FINDINGS: Endotracheal tube, left subclavian central venous line, right-sided large bore central venous catheter and oral/nasogastric tube are stable and well positioned. Left greater than right lung base opacity is also stable. Left base opacity may reflect pneumonia, atelectasis or a combination. Right base opacity is most likely atelectasis. Remainder of the lungs is clear. No convincing pleural effusion or pneumothorax. IMPRESSION: 1. No significant change from the previous day's study. 2. Left  left lung base opacity consistent with pneumonia, atelectasis or a combination. Mild right basilar atelectasis. 3. Support apparatus is stable and well positioned. Electronically Signed   By: Lajean Manes M.D.   On: 06/25/2015 10:37   Dg Chest Port 1 View  06/24/2015  CLINICAL DATA:  Hypoxia EXAM: PORTABLE CHEST 1 VIEW COMPARISON:  Jun 23, 2015 FINDINGS: Endotracheal tube tip is 2.4 cm above the carina. Both central catheter tips are in the superior vena cava. Feeding tube tip is below the diaphragm. No pneumothorax. There is airspace consolidation in the left lower lobe. Lungs elsewhere clear. Heart is mildly enlarged with pulmonary vascularity within normal limits. No adenopathy. No bone lesions. IMPRESSION: Persistent left lower lobe consolidation. No new opacity. Stable cardiac silhouette. Tube and catheter positions as described without pneumothorax. Electronically Signed   By: Lowella Grip III M.D.   On: 06/24/2015 07:27   Dg Chest Port 1 View  06/23/2015  CLINICAL DATA:  Respiratory failure. EXAM: PORTABLE CHEST 1 VIEW COMPARISON:  06/22/2015. FINDINGS: Feeding tube large caliber right IJ line, left subclavian central line in stable position. Stable cardiomegaly. Low lung volumes with bibasilar atelectasis and/or infiltrates again noted. No pleural effusion or pneumothorax. IMPRESSION: 1. Lines and tubes in stable position. 2. Low lung volumes with bibasilar atelectasis and/or infiltrates again noted without interim change. 3. Stable cardiomegaly. Electronically Signed   By: Marcello Moores  Register   On: 06/23/2015 07:06   Dg Chest Port 1 View  06/22/2015  CLINICAL DATA:  Status post central line placement EXAM: PORTABLE CHEST 1 VIEW COMPARISON:  06/21/2015 FINDINGS: Dialysis catheter, endotracheal tube and feeding catheter are again seen and stable. A new left subclavian central line is noted with the tip in the proximal superior vena cava. No pneumothorax is seen. Cardiac shadow is stable. The lungs  are stable as well. IMPRESSION: No pneumothorax following central line placement. No acute abnormality is seen. Electronically Signed   By: Inez Catalina M.D.   On: 06/22/2015 14:27   Portable Chest Xray  06/21/2015  CLINICAL DATA:  75 year old male status post intubation. EXAM: PORTABLE CHEST 1 VIEW COMPARISON:  Earlier radiograph dated 06/21/2015 FINDINGS: There has been interval placement of an endotracheal tube with tip approximately 2 cm above the carina. Recommend retraction of the tube by 2 cm for optimal positioning. Right-sided dialysis catheter and enteric tube remain in stable positioning. There is minimal left lung  base atelectatic changes. There is no focal consolidation, pleural effusion, or pneumothorax. There is stable cardiomegaly. No acute osseous pathology. IMPRESSION: Interval placement of an endotracheal tube with tip approximately 2 cm above the carina. Electronically Signed   By: Anner Crete M.D.   On: 06/21/2015 21:29   Dg Swallowing Func-speech Pathology  07/14/2015  Objective Swallowing Evaluation: Type of Study: MBS-Modified Barium Swallow Study Patient Details Name: KIANO SPIERING MRN: [id number] Date of Birth: 21-May-1940 Today's Date: 07/14/2015 Time: SLP Start Time (ACUTE ONLY): 1341-SLP Stop Time (ACUTE ONLY): 1350 SLP Time Calculation (min) (ACUTE ONLY): 9 min Past Medical History: Past Medical History Diagnosis Date . Obesity  . Phlebitis    Lower extermity . Pulmonary emboli ([place]) 2008   submassive, saddle . Prostate cancer ([place]) 07/2009 . Sleep apnea    on CPAP . Hx of echocardiogram 12/04/2010   Normal EF >55% no significant valve disease . History of stress test 06/27/2009   Low risk and EF of approximately 50% . DVT (deep venous thrombosis) ([place])  . Chronic kidney disease, stage 3    baseline creatinine ~1.4 . HLD (hyperlipidemia)  . HTN (hypertension)  . Dysrhythmia    A fib . Diabetes mellitus ([place])    diet controlled . History of hiatal hernia  Past Surgical History: Past  Surgical History Procedure Laterality Date . Nephrectomy  1999   for CA . Cholecystectomy  1999 . Transurethral resection of bladder tumor N/A 03/04/2013   Procedure: CYSTOSCOPY WITH RIGHT RETROGRADE PYELOGRAM AND BLADDER BIOPSY /CLOT EVACUATION/ BIOPSY PROSTATIC URETHRA WITH FULGERATION ;  Surgeon: Molli Hazard, MD;  Location: [facility];  Service: Urology;  Laterality: N/A; . Cystoscopy w/ retrogrades N/A 04/08/2013   Procedure: CYSTOSCOPY WITH CLOT EVACUATION;  Surgeon: Molli Hazard, MD;  Location: [facility];  Service: Urology;  Laterality: N/A; . Left shoulder repair   . Shoulder open rotator cuff repair Right 05/03/2015   Procedure: RIGHT SHOULDER ROTATOR CUFF REPAIR OPEN WITH GRAFT AND ANCHOR ;  Surgeon: Latanya Maudlin, MD;  Location: [facility];  Service: Orthopedics;  Laterality: Right; HPI: 75 yo male with hemorrhagic shock leading to cardiac arrest, VDRF, AKI from Rt perinephric hematoma. He was intubated 5/03-5/13, 5/16-5/27. He has hx of Lt renal cell carcinoma s/p Lt nephrectomy, prostate cancer s/p XRT, PE in 2008, OSA, HTN, HLD, DM, A fib. Subjective: pt alert, pleasant Assessment / Plan / Recommendation CHL IP CLINICAL IMPRESSIONS 07/14/2015 Therapy Diagnosis Mild pharyngeal phase dysphagia;Moderate pharyngeal phase dysphagia Clinical Impression Pt shows improvements in his mild-moderate oropharyngeal dysphagia since previous MBS. Oral phase overall is strong and effective, although he does have piecemeal swallowing particularly with liquids. He continues to have a brief delay in swallow trigger but now with most boluses being contained within the valleculae prior to initiation. The exception is when he consumes thin liquids via straw, as they reach the pyriform sinuses and are subsequently aspirated before the swallow. Cued coughing is productive of secretions mixed with barium, although it is unclear if all aspirates were expelled. A mild-moderate amount of residue still remains with solid  textures. Recommend to continue Dys 2 diet but with advancement to thin liquids by cup. Anticipate further improvements as overall strength continues to progress. Impact on safety and function Mild aspiration risk   CHL IP TREATMENT RECOMMENDATION 07/14/2015 Treatment Recommendations Therapy as outlined in treatment plan below   Prognosis 07/14/2015 Prognosis for Safe Diet Advancement Good Barriers to Reach Goals -- Barriers/Prognosis Comment -- CHL IP DIET RECOMMENDATION  07/14/2015 SLP Diet Recommendations Dysphagia 2 (Fine chop) solids;Thin liquid Liquid Administration via Cup;No straw Medication Administration Whole meds with puree Compensations Slow rate;Small sips/bites;Multiple dry swallows after each bite/sip Postural Changes Remain semi-upright after after feeds/meals (Comment);Seated upright at 90 degrees   CHL IP OTHER RECOMMENDATIONS 07/14/2015 Recommended Consults -- Oral Care Recommendations Oral care BID Other Recommendations Order thickener from pharmacy;Prohibited food (jello, ice cream, thin soups);Remove water pitcher   CHL IP FOLLOW UP RECOMMENDATIONS 07/14/2015 Follow up Recommendations LTACH   CHL IP FREQUENCY AND DURATION 07/14/2015 Speech Therapy Frequency (ACUTE ONLY) min 2x/week Treatment Duration 2 weeks      CHL IP ORAL PHASE 07/14/2015 Oral Phase Impaired Oral - Pudding Teaspoon -- Oral - Pudding Cup -- Oral - Honey Teaspoon -- Oral - Honey Cup -- Oral - Nectar Teaspoon NT Oral - Nectar Cup NT Oral - Nectar Straw NT Oral - Thin Teaspoon NT Oral - Thin Cup Piecemeal swallowing Oral - Thin Straw Piecemeal swallowing Oral - Puree WFL Oral - Mech Soft WFL Oral - Regular -- Oral - Multi-Consistency -- Oral - Pill -- Oral Phase - Comment --  CHL IP PHARYNGEAL PHASE 07/14/2015 Pharyngeal Phase Impaired Pharyngeal- Pudding Teaspoon -- Pharyngeal -- Pharyngeal- Pudding Cup -- Pharyngeal -- Pharyngeal- Honey Teaspoon -- Pharyngeal -- Pharyngeal- Honey Cup -- Pharyngeal -- Pharyngeal- Nectar Teaspoon NT Pharyngeal  -- Pharyngeal- Nectar Cup NT Pharyngeal -- Pharyngeal- Nectar Straw NT Pharyngeal -- Pharyngeal- Thin Teaspoon NT Pharyngeal -- Pharyngeal- Thin Cup Delayed swallow initiation-vallecula;Reduced tongue base retraction Pharyngeal Material does not enter airway Pharyngeal- Thin Straw Delayed swallow initiation-pyriform sinuses;Penetration/Aspiration before swallow;Reduced tongue base retraction Pharyngeal Material enters airway, passes BELOW cords without attempt by patient to eject out (silent aspiration) Pharyngeal- Puree Delayed swallow initiation-vallecula;Reduced tongue base retraction;Pharyngeal residue - valleculae Pharyngeal -- Pharyngeal- Mechanical Soft Delayed swallow initiation-vallecula;Reduced tongue base retraction;Pharyngeal residue - valleculae Pharyngeal -- Pharyngeal- Regular -- Pharyngeal -- Pharyngeal- Multi-consistency -- Pharyngeal -- Pharyngeal- Pill -- Pharyngeal -- Pharyngeal Comment --  CHL IP CERVICAL ESOPHAGEAL PHASE 07/14/2015 Cervical Esophageal Phase WFL Pudding Teaspoon -- Pudding Cup -- Honey Teaspoon -- Honey Cup -- Nectar Teaspoon -- Nectar Cup -- Nectar Straw -- Thin Teaspoon -- Thin Cup -- Thin Straw -- Puree -- Mechanical Soft -- Regular -- Multi-consistency -- Pill -- Cervical Esophageal Comment -- No flowsheet data found. Germain Osgood, M.A. CCC-SLP [phone] Germain Osgood 07/14/2015, 2:47 PM              Dg Swallowing Func-speech Pathology  07/05/2015  Objective Swallowing Evaluation: Type of Study: MBS-Modified Barium Swallow Study Patient Details Name: TRUMON BERDING MRN: [id number] Date of Birth: [birth year] Today's Date: 07/05/2015 Time: SLP Start Time (ACUTE ONLY): 1051-SLP Stop Time (ACUTE ONLY): 1114 SLP Time Calculation (min) (ACUTE ONLY): 23 min Past Medical History: Past Medical History Diagnosis Date . Obesity  . Phlebitis    Lower extermity . Pulmonary emboli ([place]) 2008   submassive, saddle . Prostate cancer ([place]) 07/2009 . Sleep apnea    on CPAP . Hx of  echocardiogram 12/04/2010   Normal EF >55% no significant valve disease . History of stress test 06/27/2009   Low risk and EF of approximately 50% . DVT (deep venous thrombosis) ([place])  . Chronic kidney disease, stage 3    baseline creatinine ~1.4 . HLD (hyperlipidemia)  . HTN (hypertension)  . Dysrhythmia    A fib . Diabetes mellitus ([place])    diet controlled . History of hiatal hernia  Past Surgical History: Past Surgical History  Procedure Laterality Date . Nephrectomy  1999   for CA . Cholecystectomy  1999 . Transurethral resection of bladder tumor N/A 03/04/2013   Procedure: CYSTOSCOPY WITH RIGHT RETROGRADE PYELOGRAM AND BLADDER BIOPSY /CLOT EVACUATION/ BIOPSY PROSTATIC URETHRA WITH FULGERATION ;  Surgeon: Molli Hazard, MD;  Location: [facility];  Service: Urology;  Laterality: N/A; . Cystoscopy w/ retrogrades N/A 04/08/2013   Procedure: CYSTOSCOPY WITH CLOT EVACUATION;  Surgeon: Molli Hazard, MD;  Location: [facility];  Service: Urology;  Laterality: N/A; . Left shoulder repair   . Shoulder open rotator cuff repair Right 05/03/2015   Procedure: RIGHT SHOULDER ROTATOR CUFF REPAIR OPEN WITH GRAFT AND ANCHOR ;  Surgeon: Latanya Maudlin, MD;  Location: [facility];  Service: Orthopedics;  Laterality: Right; HPI: 75 yo male with hemorrhagic shock leading to cardiac arrest, VDRF, AKI from Rt perinephric hematoma. He was intubated 5/03-5/13, 5/16-5/27. He has hx of Lt renal cell carcinoma s/p Lt nephrectomy, prostate cancer s/p XRT, PE in 2008, OSA, HTN, HLD, DM, A fib. Subjective: pt alert, pleasant Assessment / Plan / Recommendation CHL IP CLINICAL IMPRESSIONS 07/05/2015 Therapy Diagnosis Mild oral phase dysphagia;Moderate pharyngeal phase dysphagia Clinical Impression Pt has a mild-moderate oropharyngeal dysphagia with prolonged intubation and generalized deconditioning resulting in sensorimotor deficits. Oral phase is mildly prolonged with reduced bolus cohesion but ultimately with adequate oral clearance. Thin  liquids reach the pyriform sinuses prior to swallow trigger and are subsequently aspirated before the swallow. This does elicit a reflexive cough, but it is not sufficient to expel aspirates. Min cues for smaller boluses of thin liquids results in silent penetration, which does clear with cued throat clearing before it can fall beneath the glottis. He has good airway protection with nectar thick liquids and solids, despite larger quantities. Reduced hyolaryngeal movement, epiglottic inversion, and base of tongue retraction result in moderate pharyngeal residue, but this is reduced with Min cues for second swallows. Given the above, would recommend more conservative diet of Dys 2 textures and nectar thick liquids. Good prognosis for diet advancement with increased time post-extubation and improved overall strength. Impact on safety and function Mild aspiration risk   CHL IP TREATMENT RECOMMENDATION 07/05/2015 Treatment Recommendations Therapy as outlined in treatment plan below   Prognosis 07/05/2015 Prognosis for Safe Diet Advancement Good Barriers to Reach Goals -- Barriers/Prognosis Comment -- CHL IP DIET RECOMMENDATION 07/05/2015 SLP Diet Recommendations Dysphagia 2 (Fine chop) solids;Nectar thick liquid Liquid Administration via Cup;Straw Medication Administration Crushed with puree Compensations Minimize environmental distractions;Slow rate;Small sips/bites;Multiple dry swallows after each bite/sip Postural Changes Remain semi-upright after after feeds/meals (Comment);Seated upright at 90 degrees   CHL IP OTHER RECOMMENDATIONS 07/05/2015 Recommended Consults -- Oral Care Recommendations Oral care BID Other Recommendations Order thickener from pharmacy;Prohibited food (jello, ice cream, thin soups);Remove water pitcher   CHL IP FOLLOW UP RECOMMENDATIONS 07/05/2015 Follow up Recommendations LTACH   CHL IP FREQUENCY AND DURATION 07/05/2015 Speech Therapy Frequency (ACUTE ONLY) min 2x/week Treatment Duration 2 weeks       CHL IP ORAL PHASE 07/05/2015 Oral Phase Impaired Oral - Pudding Teaspoon -- Oral - Pudding Cup -- Oral - Honey Teaspoon -- Oral - Honey Cup -- Oral - Nectar Teaspoon Delayed oral transit;Decreased bolus cohesion Oral - Nectar Cup Delayed oral transit;Decreased bolus cohesion Oral - Nectar Straw Delayed oral transit;Decreased bolus cohesion Oral - Thin Teaspoon Delayed oral transit;Decreased bolus cohesion Oral - Thin Cup Delayed oral transit;Decreased bolus cohesion Oral - Thin Straw -- Oral - Puree Delayed oral transit;Decreased bolus cohesion  Oral - Mech Soft Delayed oral transit;Decreased bolus cohesion;Impaired mastication Oral - Regular -- Oral - Multi-Consistency -- Oral - Pill -- Oral Phase - Comment --  CHL IP PHARYNGEAL PHASE 07/05/2015 Pharyngeal Phase Impaired Pharyngeal- Pudding Teaspoon -- Pharyngeal -- Pharyngeal- Pudding Cup -- Pharyngeal -- Pharyngeal- Honey Teaspoon -- Pharyngeal -- Pharyngeal- Honey Cup -- Pharyngeal -- Pharyngeal- Nectar Teaspoon Delayed swallow initiation-vallecula;Reduced epiglottic inversion;Reduced anterior laryngeal mobility;Reduced laryngeal elevation;Reduced tongue base retraction;Pharyngeal residue - valleculae Pharyngeal -- Pharyngeal- Nectar Cup Delayed swallow initiation-vallecula;Reduced epiglottic inversion;Reduced anterior laryngeal mobility;Reduced laryngeal elevation;Reduced tongue base retraction;Pharyngeal residue - valleculae Pharyngeal -- Pharyngeal- Nectar Straw Delayed swallow initiation-vallecula;Reduced epiglottic inversion;Reduced anterior laryngeal mobility;Reduced laryngeal elevation;Reduced tongue base retraction;Pharyngeal residue - valleculae Pharyngeal -- Pharyngeal- Thin Teaspoon Delayed swallow initiation-vallecula;Reduced epiglottic inversion;Reduced anterior laryngeal mobility;Reduced laryngeal elevation;Reduced tongue base retraction;Pharyngeal residue - valleculae Pharyngeal -- Pharyngeal- Thin Cup Reduced epiglottic inversion;Reduced anterior  laryngeal mobility;Reduced laryngeal elevation;Reduced tongue base retraction;Pharyngeal residue - valleculae;Delayed swallow initiation-pyriform sinuses;Penetration/Aspiration before swallow Pharyngeal Material enters airway, passes BELOW cords and not ejected out despite cough attempt by patient Pharyngeal- Thin Straw -- Pharyngeal -- Pharyngeal- Puree Delayed swallow initiation-vallecula;Reduced epiglottic inversion;Reduced anterior laryngeal mobility;Reduced laryngeal elevation;Reduced tongue base retraction;Pharyngeal residue - valleculae Pharyngeal -- Pharyngeal- Mechanical Soft Delayed swallow initiation-vallecula;Reduced epiglottic inversion;Reduced anterior laryngeal mobility;Reduced laryngeal elevation;Reduced tongue base retraction;Pharyngeal residue - valleculae Pharyngeal -- Pharyngeal- Regular -- Pharyngeal -- Pharyngeal- Multi-consistency -- Pharyngeal -- Pharyngeal- Pill -- Pharyngeal -- Pharyngeal Comment --  CHL IP CERVICAL ESOPHAGEAL PHASE 07/05/2015 Cervical Esophageal Phase WFL Pudding Teaspoon -- Pudding Cup -- Honey Teaspoon -- Honey Cup -- Nectar Teaspoon -- Nectar Cup -- Nectar Straw -- Thin Teaspoon -- Thin Cup -- Thin Straw -- Puree -- Mechanical Soft -- Regular -- Multi-consistency -- Pill -- Cervical Esophageal Comment -- No flowsheet data found. Germain Osgood, M.A. CCC-SLP [phone] Germain Osgood 07/05/2015, 12:06 PM               Time Spent in minutes  25   Louellen Molder M.D on 07/21/2015 at 2:00 PM  Between 7am to 7pm - Pager - [phone]  After 7pm go to [email or web address] - password [facility]  [facility] -  Office  [phone]

## 2015-07-21 NOTE — Progress Notes (Signed)
Subjective:  No new complaints Objective Vital signs in last 24 hours: Filed Vitals:   07/20/15 1633 07/20/15 2108 07/21/15 0544 07/21/15 0929  BP: 126/72 115/73 98/66 103/84  Pulse: 91 99 94 98  Temp: 98.8 F (37.1 C) 98.5 F (36.9 C) 97.9 F (36.6 C) 98.5 F (36.9 C)  TempSrc: Oral Oral Oral Oral  Resp: 18 18 19 20   Height:      Weight:      SpO2: 99% 98% 100% 98%   Weight change:   Intake/Output Summary (Last 24 hours) at 07/21/15 1220 Last data filed at 07/21/15 1016  Gross per 24 hour  Intake      0 ml  Output    850 ml  Net   -850 ml    Assessment/ Plan: Pt is a 75 y.o. yo male who was admitted on 05/31/2015 with hematuria/hematoma and BOO of solitary kidney- has been HD dependent since 4/26 Assessment/Plan: 1. 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  Patient understands that this is most likely permanent at this time- running on TTS schedule here via PC 2. GU- has had indwelling cath for 12 years- still with UOP- to continue 3. Anemia- hgb 8- on darbe- also iron low- will replete 4. Secondary hyperparathyroidism- PTH was 65- is on low dose calcitriol- no binder- phos is 4 5. HTN/volume- BP seems OK no meds- is on flomax with indwelling foley ?- will stop  Also midodrine 6. Dispo- per pt has been accepted at [facility]- once gets AVF and coumadin back on board we should have OP dialysis unit for him   [name] A    Labs: Basic Metabolic Panel:  Recent Labs Lab 07/19/15 0344 07/20/15 0520 07/21/15 0644  NA 134* 135 137  K 4.3 3.7 4.3  CL 98* 100* 100*  CO2 27 27 29   GLUCOSE 91 102* 73  BUN 43* 32* 51*  CREATININE 4.62* 3.29* 4.43*  CALCIUM 7.9* 7.9* 8.0*  PHOS 4.8* 3.5 4.0   Liver Function Tests:  Recent Labs Lab 07/19/15 0344 07/20/15 0520  07/21/15 0644  ALBUMIN 1.6* 1.6* 1.5*   No results for input(s): LIPASE, AMYLASE in the last 168 hours. No results for input(s): AMMONIA in the last 168 hours. CBC:  Recent Labs Lab 07/17/15 0458 07/18/15 0413 07/19/15 0344 07/20/15 0520 07/21/15 0644  WBC 5.2 5.1 4.4 4.1 4.4  HGB 8.7* 8.5* 8.1* 8.3* 8.0*  HCT 29.7* 28.8* 27.0* 28.4* 27.0*  MCV 95.8 95.4 93.1 93.1 91.5  PLT 159 145* 156 155 177   Cardiac Enzymes: No results for input(s): CKTOTAL, CKMB, CKMBINDEX, TROPONINI in the last 168 hours. CBG:  Recent Labs Lab 07/20/15 1222 07/20/15 1630 07/20/15 2104 07/21/15 0727 07/21/15 1120  GLUCAP 115* 103* 102* 74 102*    Iron Studies: No results for input(s): IRON, TIBC, TRANSFERRIN, FERRITIN in the last 72 hours. Studies/Results: Dg Chest Port 1 View  07/20/2015  CLINICAL DATA:  Fevers EXAM: PORTABLE CHEST 1 VIEW COMPARISON:  07/06/2015 FINDINGS: Dialysis catheter is again noted and stable. Cardiac shadow remains enlarged. A feeding catheter is been removed in the interval. The lungs are clear. Healing left rib fractures are noted. IMPRESSION: No acute abnormality noted. Electronically Signed   By: Inez Catalina M.D.   On: 07/20/2015 10:23   Medications: Infusions: .  sodium chloride Stopped (07/12/15 1200)    Scheduled Medications: . antiseptic oral rinse  7 mL Mouth Rinse BID  . calcitRIOL  0.25 mcg Oral Q M,W,F  . darbepoetin (ARANESP) injection - DIALYSIS  200 mcg Intravenous Q Tue-HD  . feeding supplement (PRO-STAT SUGAR FREE 64)  30 mL Oral TID WC  . insulin aspart  0-15 Units Subcutaneous TID WC  . insulin aspart  0-5 Units Subcutaneous QHS  . insulin NPH Human  13 Units Subcutaneous BID AC & HS  . midodrine  10 mg Oral TID AC  . multivitamin  1 tablet Oral QHS  . pantoprazole  40 mg Oral Q1200  . tamsulosin  0.4 mg Oral Daily  . Warfarin - Pharmacist Dosing Inpatient   Does not apply q1800    have reviewed scheduled and prn medications.  Physical  Exam: General: NAD- looks debilitated- whispers Heart: RRR Lungs: clear Abdomen: obese, soft, non tender Extremities: min edema Dialysis Access: right sided PC    07/21/2015,12:20 PM  LOS: 51 days

## 2015-07-22 LAB — PROTIME-INR
INR: 2.93 — AB (ref 0.00–1.49)
Prothrombin Time: 30.1 seconds — ABNORMAL HIGH (ref 11.6–15.2)

## 2015-07-22 LAB — GLUCOSE, CAPILLARY
GLUCOSE-CAPILLARY: 100 mg/dL — AB (ref 65–99)
GLUCOSE-CAPILLARY: 105 mg/dL — AB (ref 65–99)
GLUCOSE-CAPILLARY: 76 mg/dL (ref 65–99)
GLUCOSE-CAPILLARY: 98 mg/dL (ref 65–99)
Glucose-Capillary: 110 mg/dL — ABNORMAL HIGH (ref 65–99)

## 2015-07-22 LAB — RENAL FUNCTION PANEL
ANION GAP: 10 (ref 5–15)
Albumin: 1.6 g/dL — ABNORMAL LOW (ref 3.5–5.0)
BUN: 30 mg/dL — ABNORMAL HIGH (ref 6–20)
CALCIUM: 8 mg/dL — AB (ref 8.9–10.3)
CHLORIDE: 100 mmol/L — AB (ref 101–111)
CO2: 26 mmol/L (ref 22–32)
Creatinine, Ser: 3.19 mg/dL — ABNORMAL HIGH (ref 0.61–1.24)
GFR, EST AFRICAN AMERICAN: 21 mL/min — AB (ref >60)
GFR, EST NON AFRICAN AMERICAN: 18 mL/min — AB (ref >60)
Glucose, Bld: 101 mg/dL — ABNORMAL HIGH (ref 65–99)
PHOSPHORUS: 3 mg/dL (ref 2.5–4.6)
POTASSIUM: 3.4 mmol/L — AB (ref 3.5–5.1)
Sodium: 136 mmol/L (ref 135–145)

## 2015-07-22 MED ORDER — LORAZEPAM 1 MG PO TABS
1.0000 mg | ORAL_TABLET | Freq: Once | ORAL | Status: AC
  Administered 2015-07-22: 1 mg via ORAL
  Filled 2015-07-22: qty 1

## 2015-07-22 NOTE — Progress Notes (Addendum)
ANTICOAGULATION CONSULT NOTE   Pharmacy Consult: heparin when INR < 2  Indication: hx afib/DVT/PE and acute DVT  No Known Allergies  Patient Measurements: Height: 5' 9.5" (176.5 cm) Weight: 242 lb 8.1 oz (110 kg) IBW/kg (Calculated) : 71.85   Vital Signs: Temp: 98.9 F (37.2 C) (06/16 0759) Temp Source: Oral (06/16 0759) BP: 95/61 mmHg (06/16 0759) Pulse Rate: 90 (06/16 0759)  Labs:  Recent Labs  07/20/15 0520 07/21/15 0644 07/22/15 0845  HGB 8.3* 8.0*  --   HCT 28.4* 27.0*  --   PLT 155 177  --   LABPROT 31.3* 37.1* 30.1*  INR 3.09* 3.87* 2.93*  CREATININE 3.29* 4.43* 3.19*    Estimated Creatinine Clearance: 25.4 mL/min (by C-G formula based on Cr of 3.19).  Assessment: 75 yo M on coumadin pta for hx afib, DVT/PE. Warfarin currently on hold due to planned procedure on Monday, 06/19, with plan to bridge with heparin in the interim.  INR down to 2.93 < 3.87 over the last 24 hours. CBC stable with no sxs of bleeding.   Home PTA dose: 6mg  MWF, 5mg  all other days  Goal of Therapy:  INR 2-3 Monitor platelets by anticoagulation protocol: Yes   Plan:  1. Continue to hold warfarin for planned procedure on 6/19 2. When INR < 2 will begin bridging with heparin; f/u on INR 6/17 am   Vincenza Hews, PharmD, BCPS 07/22/2015, 10:29 AM Pager: [phone]

## 2015-07-22 NOTE — Progress Notes (Signed)
Physical Therapy Treatment Patient Details Name: [name] MRN: [id number] DOB: [birth year] Today's Date: 07/22/2015    History of Present Illness Pt adm with septic shock likely due to UTI. Pt with renal failure and on CVVHD. Pt then with Rt perinephric hematoma that required embolization and pt with VDRF. Cardiac arrest 5/3. Reintubated 5/16. Extubated 5/27. PMH - recent (3/28) rt rotator cuff repair, Lt renal cell carcinoma s/p Lt nephrectomy, prostate cancer s/p XRT, PE in 2008, OSA, HTN, HLD, DM, A fib.    PT Comments    Pt is up to sit with fair balance bedside and O2 sats supporting the activity.  Has plan to return to SNF but did struggle today with getting him to use LE's, with no active effort of legs to stand.  Talked with case manager and nursing and will hopefully have pt up with lift to chair after lunch with staff.  PT to follow acutely.  Follow Up Recommendations  SNF     Equipment Recommendations  Rolling walker with 5" wheels;3in1 (PT);Wheelchair (measurements PT);Wheelchair cushion (measurements PT)    Recommendations for Other Services       Precautions / Restrictions Precautions Precautions: Fall Type of Shoulder Precautions: RTC repiar 04/2015 - per [name] 6/5 pt can use his RUE however he wants within his pain tolerance. Only self ROM Precaution Comments: Per Dr [name] who called Dr. Gladstone Villegas,  Pt able to weight bear through Rt UE, but no ROM of Rt shoulder. Sent fax to [phone] 6/5 asking for specific ROM guildeline as pt is over 2 months out from repair.  Required Braces or Orthoses: Sling (but none in room) Restrictions Weight Bearing Restrictions: No    Mobility  Bed Mobility Overal bed mobility: Needs Assistance;+2 for physical assistance;+ 2 for safety/equipment Bed Mobility: Supine to Sit;Sit to Supine Rolling: Min assist Sidelying to sit: Mod assist;HOB elevated Supine to sit: Mod assist;HOB elevated Sit to supine: Mod assist;+2 for physical  assistance;+2 for safety/equipment   General bed mobility comments: assist to move LEs off bed and to lift trunk   Transfers Overall transfer level: Needs assistance Equipment used: Rolling walker (2 wheeled);2 person hand held assist Transfers: Sit to/from Stand Sit to Stand: +2 physical assistance;+2 safety/equipment;Total assist Stand pivot transfers:  (unable)       General transfer comment: lift was unavailable and nursing assisted attempts to stand, drop arm chair was broken  Ambulation/Gait             General Gait Details: unable   Stairs            Wheelchair Mobility    Modified Rankin (Stroke Patients Only)       Balance     Sitting balance-Leahy Scale: Fair       Standing balance-Leahy Scale:  (pt could not use legs to stand)                      Cognition Arousal/Alertness: Awake/alert Behavior During Therapy: WFL for tasks assessed/performed Overall Cognitive Status: Within Functional Limits for tasks assessed       Memory: Decreased short-term memory              Exercises      General Comments        Pertinent Vitals/Pain Pain Assessment: Faces Pain Score: 4  Faces Pain Scale: Hurts little more Pain Location: R shoulder Pain Descriptors / Indicators: Aching Pain Intervention(s): Monitored during session;Premedicated before session;Repositioned    Home  Living                      Prior Function            PT Goals (current goals can now be found in the care plan section) Progress towards PT goals: Progressing toward goals    Frequency  Min 3X/week    PT Plan Current plan remains appropriate    Co-evaluation             End of Session Equipment Utilized During Treatment: Gait belt Activity Tolerance: Patient limited by fatigue;Patient limited by lethargy Patient left: in bed;with call bell/phone within reach;with bed alarm set;with nursing/sitter in room;with family/visitor present      Time: [id number] PT Time Calculation (min) (ACUTE ONLY): 27 min  Charges:  $Therapeutic Activity: 23-37 mins                    G Codes:      [name] Aug 05, 2015, 2:00 PM  [name], PT MS Acute Rehab Dept. Number: [place]

## 2015-07-22 NOTE — Progress Notes (Signed)
Subjective:  HD yest- UF of 1500- tolerated well- No new complaints Objective Vital signs in last 24 hours: Filed Vitals:   07/21/15 1718 07/21/15 2011 07/22/15 0328 07/22/15 0759  BP: 131/69 115/59 109/63 95/61  Pulse: 106 104 78 90  Temp: 98.1 F (36.7 C) 99.6 F (37.6 C) 98.1 F (36.7 C) 98.9 F (37.2 C)  TempSrc: Oral Oral Oral Oral  Resp: 20 18 19 18   Height:      Weight: 110 kg (242 lb 8.1 oz)     SpO2:  95% 98% 98%   Weight change:   Intake/Output Summary (Last 24 hours) at 07/22/15 1127 Last data filed at 07/22/15 [id number]  Gross per 24 hour  Intake      0 ml  Output   2000 ml  Net  -2000 ml    Assessment/ Plan: Pt is a 75 y.o. yo male who was admitted on 05/31/2015 with hematuria/hematoma and BOO of solitary kidney- has been HD dependent since 4/26 Assessment/Plan: 1. 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  Patient understands that this is most likely permanent at this time- running on TTS schedule here via [facility]- plan for next HD tomorrow 2. GU- has had indwelling cath for 12 years- still with UOP- to continue 3. Anemia- hgb 8- on darbe- also iron low- will replete 4. Secondary hyperparathyroidism- PTH was 65- is on low dose calcitriol- no binder- phos is 3 5. HTN/volume- BP seems OK no meds- is on flomax with indwelling foley ?- will stop  Also midodrine 6. Dispo- per pt has been accepted at [facility]- once gets AVF and coumadin back on board we should have OP dialysis unit for him   [name] A    Labs: Basic Metabolic Panel:  Recent Labs Lab 07/20/15 0520 07/21/15 0644 07/22/15 0845  NA 135 137 136  K 3.7 4.3 3.4*  CL 100* 100* 100*  CO2 27 29 26   GLUCOSE 102* 73 101*  BUN 32* 51* 30*  CREATININE 3.29* 4.43* 3.19*  CALCIUM 7.9* 8.0* 8.0*  PHOS 3.5 4.0 3.0    Liver Function Tests:  Recent Labs Lab 07/20/15 0520 07/21/15 0644 07/22/15 0845  ALBUMIN 1.6* 1.5* 1.6*   No results for input(s): LIPASE, AMYLASE in the last 168 hours. No results for input(s): AMMONIA in the last 168 hours. CBC:  Recent Labs Lab 07/17/15 0458 07/18/15 0413 07/19/15 0344 07/20/15 0520 07/21/15 0644  WBC 5.2 5.1 4.4 4.1 4.4  HGB 8.7* 8.5* 8.1* 8.3* 8.0*  HCT 29.7* 28.8* 27.0* 28.4* 27.0*  MCV 95.8 95.4 93.1 93.1 91.5  PLT 159 145* 156 155 177   Cardiac Enzymes: No results for input(s): CKTOTAL, CKMB, CKMBINDEX, TROPONINI in the last 168 hours. CBG:  Recent Labs Lab 07/21/15 1120 07/21/15 1746 07/21/15 1838 07/21/15 2009 07/22/15 0739  GLUCAP 102* 69 91 99 76    Iron Studies: No results for input(s): IRON, TIBC, TRANSFERRIN, FERRITIN in the last 72 hours. Studies/Results: No results found. Medications: Infusions: . sodium chloride Stopped (07/12/15 1200)    Scheduled Medications: . antiseptic oral rinse  7 mL Mouth Rinse BID  . calcitRIOL  0.25 mcg Oral Q M,W,F  . darbepoetin (ARANESP) injection - DIALYSIS  200 mcg Intravenous Q Tue-HD  . feeding supplement (PRO-STAT SUGAR FREE 64)  30 mL Oral  TID WC  . ferric gluconate (FERRLECIT/NULECIT) IV  125 mg Intravenous Daily  . insulin aspart  0-15 Units Subcutaneous TID WC  . insulin aspart  0-5 Units Subcutaneous QHS  . insulin NPH Human  13 Units Subcutaneous BID AC & HS  . midodrine  10 mg Oral TID AC  . multivitamin  1 tablet Oral QHS  . pantoprazole  40 mg Oral Q1200  . Warfarin - Pharmacist Dosing Inpatient   Does not apply q1800    have reviewed scheduled and prn medications.  Physical Exam: General: NAD- looks debilitated- whispers Heart: RRR Lungs: clear Abdomen: obese, soft, non tender Extremities: min edema Dialysis Access: right sided PC    07/22/2015,11:27 AM  LOS: 52 days

## 2015-07-22 NOTE — Progress Notes (Signed)
Visit to patients room to place on Cpap.  Pt said having chest issues heart etc... RN getting him something for it.  He didn't want to be put on the Cpap yet

## 2015-07-22 NOTE — Progress Notes (Signed)
PROGRESS NOTE                                                                                                                                                                                                             Patient Demographics:    [name], is a 75 y.o. male, DOB - 11-05-1940, [id number]  Admit date - 05/31/2015   Admitting Physician Rigoberto Noel, MD  Outpatient Primary MD for the patient is [facility]  LOS - 52  Outpatient Specialists:   Chief Complaint  Patient presents with  . Hypotension       Brief Narrative   75 year old male with history of A. fib, DVT and PE on Coumadin, type 2 diabetes mellitus, renal cell carcinoma status post left nephrectomy, dysrhythmia, hyperlipidemia, prostatic CA status post radiation 2011 who was seen in the ED on 4/22 4:00 catheter which was irrigated and replaced. He had hematuria for quite some time which was attributed to scarring from prior radiation. However when he had his INR checked on 4/25 it was 8 and patient was found to be hypotensive with blood pressure 70/44 mmHg and sent to the ED. In the ED patient was started to have UTI and despite open 5 L IV bolus he was hypotensive along with sodium of 124, K of 5, lactate of 3 and serum creatinine of 9.36. Patient admitted to ICU for septic shock secondary to UTI and acute kidney injury.  Significant Events: 4/25 admitted by [facility] w/ septic shock due to UTI + ARF  4/26 Renal u/s > s/p Lt nephrectomy 4/27 CT abd/pelvis > Rt perinephric hematoma 4/30 TR[facility] assumed care  5/02 CT abd/pelvis > increased size of hematoma 5/03 Cardiac arrest w/ severe anemia > coil embolization R kidney by IR; PTX post-CPR 5/08 TTE EF 65-70% 5/16 Reintubated due to airway secretions 5/18 Doppler legs b/l > Acute DVT Rt gastrocnemius vein, age indeterminate DVT Rt femoral vein and popliteal vein > heparin gtt 5/23 TTE Moderate LVH - EF  65-70%. No wall motion abnormalities. LA mildly dilated. No AS or AR.  5/25 LLE Arterial Duplex: No evidence of stenosis or occluded arteries. 5/27- extubated 5/28 off cvvhd 6/4 CorTrak tube removed   Subjective:   Pt sleepy this am. No overnight issues   Assessment  & Plan :   Fever on 6/13-14 .?  Acute Viral illness. UA does show numerous WBCs. However given his chronic Foley this might be colonization   Urine culture showing gram-negative rods. Will follow sensitivity. Blood culture so far negative. Hold off on abx.   Acute on chronic kidney disease, now ESRD  Has a tunneled catheter. Plan on permanent access next week. Hemodialysis onTu/Th/Sat schedule  Nephrology following. He is now long term HD   per renal. Needs to be CLIPed  Septic/hemorrhagic shock with acute blood loss anemia Secondary to right perinephric hematoma. Hemoglobin remained stable. Monitor INR closely.  Septic shock due to Klebsiella pyelonephritis and bacteremia. Completed 14 day antibiotic course on 5/10.  Right gastrocnemius vein acute DVT, right femoral and popliteal vein DVTs On Coumadin. Dosing per pharmacy. INR supratherapeutic. Monitor H&H closely.  A. fib rate controlled. Continue Coumadin.  Chronic diastolic CHF.  Volume management with dialysis. Euvolemic  Cardiac arrest on 5/3, followed by pneumothorax Suspected to be due to severe anemia.  OSA on CPAP   Chronic hematuria Patient refusing scheduled Foley flushes complaining of some abdominal discomfort. Advised that it is important to prevent catheter from clotting.  Ischemic changes of left foot Arterial duplex without notable findings. Has palpable distal pulses.    Code Status :Full code  Family Communication  : Wife at bedside  Disposition Plan  :  SNF  Barriers For Discharge : pending to be CLIPED  Consults  :   Nephrology [facility] Orthopedics Urology Vascular surgery   Procedures  :  See hospital course  DVT  Prophylaxis  :  Coumadin  Lab Results  Component Value Date   PLT 177 07/21/2015    Antibiotics  :   IV Rocephin 4/28-5/10  Anti-infectives    Start     Dose/Rate Route Frequency Ordered Stop   06/22/15 2300  vancomycin (VANCOCIN) IVPB 1000 mg/200 mL premix  Status:  Discontinued     1,000 mg 200 mL/hr over 60 Minutes Intravenous Every 24 hours 06/21/15 2118 06/22/15 0921   06/21/15 2300  cefTAZidime (FORTAZ) 2 g in dextrose 5 % 50 mL IVPB  Status:  Discontinued     2 g 100 mL/hr over 30 Minutes Intravenous Every 12 hours 06/21/15 2118 06/22/15 0908   06/21/15 2200  vancomycin (VANCOCIN) 2,000 mg in sodium chloride 0.9 % 500 mL IVPB     2,000 mg 250 mL/hr over 120 Minutes Intravenous  Once 06/21/15 2118 06/22/15 0005   06/17/15 1200  ceFAZolin (ANCEF) 3 g in dextrose 5 % 50 mL IVPB     3 g 130 mL/hr over 30 Minutes Intravenous To Radiology 06/17/15 1156 06/17/15 1251   06/17/15 0924  ceFAZolin (ANCEF) 1-5 GM-% IVPB    Comments:  Covington, Jamie   : cabinet override      06/17/15 0924 06/17/15 1222   06/17/15 0924  ceFAZolin (ANCEF) 2-4 GM/100ML-% IVPB    Comments:  Soyla Dryer   : cabinet override      06/17/15 0924 06/17/15 1222   06/04/15 1100  cefTRIAXone (ROCEPHIN) 2 g in dextrose 5 % 50 mL IVPB     2 g 100 mL/hr over 30 Minutes Intravenous Every 24 hours 06/03/15 1038 06/15/15 1134   06/03/15 1800  cefTAZidime (FORTAZ) 1 g in dextrose 5 % 50 mL IVPB  Status:  Discontinued     1 g 100 mL/hr over 30 Minutes Intravenous Every 24 hours 06/03/15 0737 06/03/15 0956   06/03/15 1100  cefTRIAXone (ROCEPHIN) 1 g in dextrose 5 % 50  mL IVPB  Status:  Discontinued     1 g 100 mL/hr over 30 Minutes Intravenous Every 24 hours 06/03/15 0956 06/03/15 1038   06/02/15 1800  cefTAZidime (FORTAZ) 2 g in dextrose 5 % 50 mL IVPB  Status:  Discontinued     2 g 100 mL/hr over 30 Minutes Intravenous Every 12 hours 06/02/15 1306 06/03/15 0737   05/31/15 1800  cefTAZidime (FORTAZ) 2 g in  dextrose 5 % 50 mL IVPB  Status:  Discontinued     2 g 100 mL/hr over 30 Minutes Intravenous Every 48 hours 05/31/15 1444 06/02/15 1306   05/31/15 1130  vancomycin (VANCOCIN) 2,250 mg in sodium chloride 0.9 % 500 mL IVPB  Status:  Discontinued     2,250 mg 250 mL/hr over 120 Minutes Intravenous  Once 05/31/15 1124 05/31/15 1128   05/31/15 1130  vancomycin (VANCOCIN) 2,500 mg in sodium chloride 0.9 % 500 mL IVPB     2,500 mg 250 mL/hr over 120 Minutes Intravenous  Once 05/31/15 1128 05/31/15 1347   05/31/15 1115  piperacillin-tazobactam (ZOSYN) IVPB 3.375 g     3.375 g 100 mL/hr over 30 Minutes Intravenous  Once 05/31/15 1114 05/31/15 1239        Objective:   Filed Vitals:   07/21/15 1718 07/21/15 2011 07/22/15 0328 07/22/15 0759  BP: 131/69 115/59 109/63 95/61  Pulse: 106 104 78 90  Temp: 98.1 F (36.7 C) 99.6 F (37.6 C) 98.1 F (36.7 C) 98.9 F (37.2 C)  TempSrc: Oral Oral Oral Oral  Resp: 20 18 19 18   Height:      Weight: 110 kg (242 lb 8.1 oz)     SpO2:  95% 98% 98%    Wt Readings from Last 3 Encounters:  07/21/15 110 kg (242 lb 8.1 oz)  05/27/15 120.657 kg (266 lb)  05/10/15 126.735 kg (279 lb 6.4 oz)     Intake/Output Summary (Last 24 hours) at 07/22/15 1140 Last data filed at 07/22/15 0612  Gross per 24 hour  Intake      0 ml  Output   2000 ml  Net  -2000 ml     Physical Exam  Gen: not in distress HEENT:  moist mucosa, supple neck Chest: clear b/l, no added sounds , HD catheter over left chest CVS: N S1&S2, no murmurs, rubs or gallop GI: soft, NT, ND, BS+, Chronic foley Musculoskeletal: warm, no edema     Data Review:    CBC  Recent Labs Lab 07/17/15 0458 07/18/15 0413 07/19/15 0344 07/20/15 0520 07/21/15 0644  WBC 5.2 5.1 4.4 4.1 4.4  HGB 8.7* 8.5* 8.1* 8.3* 8.0*  HCT 29.7* 28.8* 27.0* 28.4* 27.0*  PLT 159 145* 156 155 177  MCV 95.8 95.4 93.1 93.1 91.5  MCH 28.1 28.1 27.9 27.2 27.1  MCHC 29.3* 29.5* 30.0 29.2* 29.6*  RDW 18.8*  18.4* 18.4* 18.5* 18.7*    Chemistries   Recent Labs Lab 07/18/15 0413 07/19/15 0344 07/20/15 0520 07/21/15 0644 07/22/15 0845  NA 137 134* 135 137 136  K 4.2 4.3 3.7 4.3 3.4*  CL 99* 98* 100* 100* 100*  CO2 28 27 27 29 26   GLUCOSE 75 91 102* 73 101*  BUN 30* 43* 32* 51* 30*  CREATININE 3.72* 4.62* 3.29* 4.43* 3.19*  CALCIUM 7.9* 7.9* 7.9* 8.0* 8.0*   ------------------------------------------------------------------------------------------------------------------ No results for input(s): CHOL, HDL, LDLCALC, TRIG, CHOLHDL, LDLDIRECT in the last 72 hours.  Lab Results  Component Value Date   HGBA1C 7.8*  06/07/2015   ------------------------------------------------------------------------------------------------------------------ No results for input(s): TSH, T4TOTAL, T3FREE, THYROIDAB in the last 72 hours.  Invalid input(s): FREET3 ------------------------------------------------------------------------------------------------------------------ No results for input(s): VITAMINB12, FOLATE, FERRITIN, TIBC, IRON, RETICCTPCT in the last 72 hours.  Coagulation profile  Recent Labs Lab 07/18/15 0413 07/19/15 0344 07/20/15 0520 07/21/15 0644 07/22/15 0845  INR 3.36* 3.50* 3.09* 3.87* 2.93*    No results for input(s): DDIMER in the last 72 hours.  Cardiac Enzymes No results for input(s): CKMB, TROPONINI, MYOGLOBIN in the last 168 hours.  Invalid input(s): CK ------------------------------------------------------------------------------------------------------------------ No results found for: BNP  Inpatient Medications  Scheduled Meds: . antiseptic oral rinse  7 mL Mouth Rinse BID  . calcitRIOL  0.25 mcg Oral Q M,W,F  . darbepoetin (ARANESP) injection - DIALYSIS  200 mcg Intravenous Q Tue-HD  . feeding supplement (PRO-STAT SUGAR FREE 64)  30 mL Oral TID WC  . ferric gluconate (FERRLECIT/NULECIT) IV  125 mg Intravenous Daily  . insulin aspart  0-15 Units  Subcutaneous TID WC  . insulin aspart  0-5 Units Subcutaneous QHS  . insulin NPH Human  13 Units Subcutaneous BID AC & HS  . midodrine  10 mg Oral TID AC  . multivitamin  1 tablet Oral QHS  . pantoprazole  40 mg Oral Q1200  . Warfarin - Pharmacist Dosing Inpatient   Does not apply q1800   Continuous Infusions: . sodium chloride Stopped (07/12/15 1200)   PRN Meds:.acetaminophen (TYLENOL) oral liquid 160 mg/5 mL, albuterol, guaiFENesin, ondansetron, oxyCODONE, RESOURCE THICKENUP CLEAR, sodium chloride flush  Micro Results Recent Results (from the past 240 hour(s))  Culture, blood (routine x 2)     Status: None (Preliminary result)   Collection Time: 07/20/15 11:29 AM  Result Value Ref Range Status   Specimen Description BLOOD RIGHT ANTECUBITAL  Final   Special Requests BOTTLES DRAWN AEROBIC AND ANAEROBIC  10CC  Final   Culture NO GROWTH 1 DAY  Final   Report Status PENDING  Incomplete  Culture, blood (routine x 2)     Status: None (Preliminary result)   Collection Time: 07/20/15 11:50 AM  Result Value Ref Range Status   Specimen Description BLOOD RIGHT ANTECUBITAL  Final   Special Requests BOTTLES DRAWN AEROBIC AND ANAEROBIC  5CC  Final   Culture NO GROWTH 1 DAY  Final   Report Status PENDING  Incomplete  Culture, Urine     Status: Abnormal (Preliminary result)   Collection Time: 07/20/15  7:17 PM  Result Value Ref Range Status   Specimen Description URINE, CATHETERIZED  Final   Special Requests NONE  Final   Culture >=100,000 COLONIES/mL GRAM NEGATIVE RODS (A)  Final   Report Status PENDING  Incomplete    Radiology Reports Dg Shoulder Right  07/07/2015  CLINICAL DATA:  Right shoulder pain. Recent rotator cuff surgery. No known injury. EXAM: RIGHT SHOULDER - 2+ VIEW COMPARISON:  Single-view of the chest 06/19/2015 and 07/06/2015. FINDINGS: There is lucency in the greater tuberosity. There appears to be a small focus of cortical disruption present. Mild appearing acromioclavicular  degenerative change is seen. Dialysis catheter and feeding tube are noted. IMPRESSION: Small cortical defect in the greater tuberosity of the right humeral head and increased lucency in the greater tuberosity could be due to infection or related to the patient's recent surgery. Projection is different than on prior chest x-rays but the findings appear more conspicuous. Electronically Signed   By: Inge Rise M.D.   On: 07/07/2015 17:24   Dg Chest Port 1  View  07/20/2015  CLINICAL DATA:  Fevers EXAM: PORTABLE CHEST 1 VIEW COMPARISON:  07/06/2015 FINDINGS: Dialysis catheter is again noted and stable. Cardiac shadow remains enlarged. A feeding catheter is been removed in the interval. The lungs are clear. Healing left rib fractures are noted. IMPRESSION: No acute abnormality noted. Electronically Signed   By: Inez Catalina M.D.   On: 07/20/2015 10:23   Dg Chest Port 1 View  07/06/2015  CLINICAL DATA:  Acute renal failure. EXAM: PORTABLE CHEST 1 VIEW COMPARISON:  06/27/2015 . FINDINGS: Interim extubation. Dialysis catheter and left subclavian central line in stable position. Feeding tube in stable position. Stable cardiomegaly. Persistent left base subsegmental atelectasis and or infiltrate with slight interim progression. No pleural effusion or pneumothorax. IMPRESSION: 1. Interim extubation. Right dialysis catheter left subclavian central line stable position. Feeding tube in stable position. 2. Slight interim progression of left base atelectasis and or infiltrate. Electronically Signed   By: [place]   On: 07/06/2015 07:39   Dg Chest Port 1 View  06/27/2015  CLINICAL DATA:  Respiratory failure.  Endotracheal tube placement EXAM: PORTABLE CHEST 1 VIEW COMPARISON:  06/25/2015 FINDINGS: Endotracheal tube in good position. Feeding tube enters the stomach with the tip not visualized. Left subclavian central venous catheter tip in the SVC. Right jugular central venous catheter tip in the SVC. These are  unchanged. No pneumothorax. Mild left lower lobe airspace disease is unchanged. Negative for heart failure or pleural effusion. IMPRESSION: Support lines remain in good position and unchanged Left lower lobe atelectasis/infiltrate unchanged. Electronically Signed   By: Franchot Gallo M.D.   On: 06/27/2015 07:10   Dg Chest Port 1 View  06/25/2015  CLINICAL DATA:  Respiratory failure.  Subsequent encounter. EXAM: PORTABLE CHEST 1 VIEW COMPARISON:  06/24/2015 FINDINGS: Endotracheal tube, left subclavian central venous line, right-sided large bore central venous catheter and oral/nasogastric tube are stable and well positioned. Left greater than right lung base opacity is also stable. Left base opacity may reflect pneumonia, atelectasis or a combination. Right base opacity is most likely atelectasis. Remainder of the lungs is clear. No convincing pleural effusion or pneumothorax. IMPRESSION: 1. No significant change from the previous day's study. 2. Left left lung base opacity consistent with pneumonia, atelectasis or a combination. Mild right basilar atelectasis. 3. Support apparatus is stable and well positioned. Electronically Signed   By: Lajean Manes M.D.   On: 06/25/2015 10:37   Dg Chest Port 1 View  06/24/2015  CLINICAL DATA:  Hypoxia EXAM: PORTABLE CHEST 1 VIEW COMPARISON:  Jun 23, 2015 FINDINGS: Endotracheal tube tip is 2.4 cm above the carina. Both central catheter tips are in the superior vena cava. Feeding tube tip is below the diaphragm. No pneumothorax. There is airspace consolidation in the left lower lobe. Lungs elsewhere clear. Heart is mildly enlarged with pulmonary vascularity within normal limits. No adenopathy. No bone lesions. IMPRESSION: Persistent left lower lobe consolidation. No new opacity. Stable cardiac silhouette. Tube and catheter positions as described without pneumothorax. Electronically Signed   By: Lowella Grip III M.D.   On: 06/24/2015 07:27   Dg Chest Port 1  View  06/23/2015  CLINICAL DATA:  Respiratory failure. EXAM: PORTABLE CHEST 1 VIEW COMPARISON:  06/22/2015. FINDINGS: Feeding tube large caliber right IJ line, left subclavian central line in stable position. Stable cardiomegaly. Low lung volumes with bibasilar atelectasis and/or infiltrates again noted. No pleural effusion or pneumothorax. IMPRESSION: 1. Lines and tubes in stable position. 2. Low lung volumes with  bibasilar atelectasis and/or infiltrates again noted without interim change. 3. Stable cardiomegaly. Electronically Signed   By: Marcello Moores  Register   On: 06/23/2015 07:06   Dg Chest Port 1 View  06/22/2015  CLINICAL DATA:  Status post central line placement EXAM: PORTABLE CHEST 1 VIEW COMPARISON:  06/21/2015 FINDINGS: Dialysis catheter, endotracheal tube and feeding catheter are again seen and stable. A new left subclavian central line is noted with the tip in the proximal superior vena cava. No pneumothorax is seen. Cardiac shadow is stable. The lungs are stable as well. IMPRESSION: No pneumothorax following central line placement. No acute abnormality is seen. Electronically Signed   By: Inez Catalina M.D.   On: 06/22/2015 14:27   Dg Swallowing Func-speech Pathology  07/14/2015  Objective Swallowing Evaluation: Type of Study: MBS-Modified Barium Swallow Study Patient Details Name: ZANDEN BURGERS MRN: [id number] Date of Birth: February 02, 1941 Today's Date: 07/14/2015 Time: SLP Start Time (ACUTE ONLY): 1341-SLP Stop Time (ACUTE ONLY): 1350 SLP Time Calculation (min) (ACUTE ONLY): 9 min Past Medical History: Past Medical History Diagnosis Date . Obesity  . Phlebitis    Lower extermity . Pulmonary emboli ([place]) 2008   submassive, saddle . Prostate cancer ([place]) 07/2009 . Sleep apnea    on CPAP . Hx of echocardiogram 12/04/2010   Normal EF >55% no significant valve disease . History of stress test 06/27/2009   Low risk and EF of approximately 50% . DVT (deep venous thrombosis) ([place])  . Chronic kidney disease, stage 3     baseline creatinine ~1.4 . HLD (hyperlipidemia)  . HTN (hypertension)  . Dysrhythmia    A fib . Diabetes mellitus ([place])    diet controlled . History of hiatal hernia  Past Surgical History: Past Surgical History Procedure Laterality Date . Nephrectomy  1999   for CA . Cholecystectomy  1999 . Transurethral resection of bladder tumor N/A 03/04/2013   Procedure: CYSTOSCOPY WITH RIGHT RETROGRADE PYELOGRAM AND BLADDER BIOPSY /CLOT EVACUATION/ BIOPSY PROSTATIC URETHRA WITH FULGERATION ;  Surgeon: Molli Hazard, MD;  Location: [facility];  Service: Urology;  Laterality: N/A; . Cystoscopy w/ retrogrades N/A 04/08/2013   Procedure: CYSTOSCOPY WITH CLOT EVACUATION;  Surgeon: Molli Hazard, MD;  Location: [facility];  Service: Urology;  Laterality: N/A; . Left shoulder repair   . Shoulder open rotator cuff repair Right 05/03/2015   Procedure: RIGHT SHOULDER ROTATOR CUFF REPAIR OPEN WITH GRAFT AND ANCHOR ;  Surgeon: Latanya Maudlin, MD;  Location: [facility];  Service: Orthopedics;  Laterality: Right; HPI: 75 yo male with hemorrhagic shock leading to cardiac arrest, VDRF, AKI from Rt perinephric hematoma. He was intubated 5/03-5/13, 5/16-5/27. He has hx of Lt renal cell carcinoma s/p Lt nephrectomy, prostate cancer s/p XRT, PE in 2008, OSA, HTN, HLD, DM, A fib. Subjective: pt alert, pleasant Assessment / Plan / Recommendation CHL IP CLINICAL IMPRESSIONS 07/14/2015 Therapy Diagnosis Mild pharyngeal phase dysphagia;Moderate pharyngeal phase dysphagia Clinical Impression Pt shows improvements in his mild-moderate oropharyngeal dysphagia since previous MBS. Oral phase overall is strong and effective, although he does have piecemeal swallowing particularly with liquids. He continues to have a brief delay in swallow trigger but now with most boluses being contained within the valleculae prior to initiation. The exception is when he consumes thin liquids via straw, as they reach the pyriform sinuses and are subsequently aspirated  before the swallow. Cued coughing is productive of secretions mixed with barium, although it is unclear if all aspirates were expelled. A mild-moderate amount of residue still remains  with solid textures. Recommend to continue Dys 2 diet but with advancement to thin liquids by cup. Anticipate further improvements as overall strength continues to progress. Impact on safety and function Mild aspiration risk   CHL IP TREATMENT RECOMMENDATION 07/14/2015 Treatment Recommendations Therapy as outlined in treatment plan below   Prognosis 07/14/2015 Prognosis for Safe Diet Advancement Good Barriers to Reach Goals -- Barriers/Prognosis Comment -- CHL IP DIET RECOMMENDATION 07/14/2015 SLP Diet Recommendations Dysphagia 2 (Fine chop) solids;Thin liquid Liquid Administration via Cup;No straw Medication Administration Whole meds with puree Compensations Slow rate;Small sips/bites;Multiple dry swallows after each bite/sip Postural Changes Remain semi-upright after after feeds/meals (Comment);Seated upright at 90 degrees   CHL IP OTHER RECOMMENDATIONS 07/14/2015 Recommended Consults -- Oral Care Recommendations Oral care BID Other Recommendations Order thickener from pharmacy;Prohibited food (jello, ice cream, thin soups);Remove water pitcher   CHL IP FOLLOW UP RECOMMENDATIONS 07/14/2015 Follow up Recommendations LTACH   CHL IP FREQUENCY AND DURATION 07/14/2015 Speech Therapy Frequency (ACUTE ONLY) min 2x/week Treatment Duration 2 weeks      CHL IP ORAL PHASE 07/14/2015 Oral Phase Impaired Oral - Pudding Teaspoon -- Oral - Pudding Cup -- Oral - Honey Teaspoon -- Oral - Honey Cup -- Oral - Nectar Teaspoon NT Oral - Nectar Cup NT Oral - Nectar Straw NT Oral - Thin Teaspoon NT Oral - Thin Cup Piecemeal swallowing Oral - Thin Straw Piecemeal swallowing Oral - Puree WFL Oral - Mech Soft WFL Oral - Regular -- Oral - Multi-Consistency -- Oral - Pill -- Oral Phase - Comment --  CHL IP PHARYNGEAL PHASE 07/14/2015 Pharyngeal Phase Impaired Pharyngeal-  Pudding Teaspoon -- Pharyngeal -- Pharyngeal- Pudding Cup -- Pharyngeal -- Pharyngeal- Honey Teaspoon -- Pharyngeal -- Pharyngeal- Honey Cup -- Pharyngeal -- Pharyngeal- Nectar Teaspoon NT Pharyngeal -- Pharyngeal- Nectar Cup NT Pharyngeal -- Pharyngeal- Nectar Straw NT Pharyngeal -- Pharyngeal- Thin Teaspoon NT Pharyngeal -- Pharyngeal- Thin Cup Delayed swallow initiation-vallecula;Reduced tongue base retraction Pharyngeal Material does not enter airway Pharyngeal- Thin Straw Delayed swallow initiation-pyriform sinuses;Penetration/Aspiration before swallow;Reduced tongue base retraction Pharyngeal Material enters airway, passes BELOW cords without attempt by patient to eject out (silent aspiration) Pharyngeal- Puree Delayed swallow initiation-vallecula;Reduced tongue base retraction;Pharyngeal residue - valleculae Pharyngeal -- Pharyngeal- Mechanical Soft Delayed swallow initiation-vallecula;Reduced tongue base retraction;Pharyngeal residue - valleculae Pharyngeal -- Pharyngeal- Regular -- Pharyngeal -- Pharyngeal- Multi-consistency -- Pharyngeal -- Pharyngeal- Pill -- Pharyngeal -- Pharyngeal Comment --  CHL IP CERVICAL ESOPHAGEAL PHASE 07/14/2015 Cervical Esophageal Phase WFL Pudding Teaspoon -- Pudding Cup -- Honey Teaspoon -- Honey Cup -- Nectar Teaspoon -- Nectar Cup -- Nectar Straw -- Thin Teaspoon -- Thin Cup -- Thin Straw -- Puree -- Mechanical Soft -- Regular -- Multi-consistency -- Pill -- Cervical Esophageal Comment -- No flowsheet data found. Germain Osgood, M.A. CCC-SLP [phone] Germain Osgood 07/14/2015, 2:47 PM              Dg Swallowing Func-speech Pathology  07/05/2015  Objective Swallowing Evaluation: Type of Study: MBS-Modified Barium Swallow Study Patient Details Name: TAIVEN FAUPEL MRN: [id number] Date of Birth: 11-03-1940 Today's Date: 07/05/2015 Time: SLP Start Time (ACUTE ONLY): 1051-SLP Stop Time (ACUTE ONLY): 1114 SLP Time Calculation (min) (ACUTE ONLY): 23 min Past Medical History:  Past Medical History Diagnosis Date . Obesity  . Phlebitis    Lower extermity . Pulmonary emboli ([place]) 2008   submassive, saddle . Prostate cancer ([place]) 07/2009 . Sleep apnea    on CPAP . Hx of echocardiogram 12/04/2010   Normal EF >55% no significant  valve disease . History of stress test 06/27/2009   Low risk and EF of approximately 50% . DVT (deep venous thrombosis) ([place])  . Chronic kidney disease, stage 3    baseline creatinine ~1.4 . HLD (hyperlipidemia)  . HTN (hypertension)  . Dysrhythmia    A fib . Diabetes mellitus ([place])    diet controlled . History of hiatal hernia  Past Surgical History: Past Surgical History Procedure Laterality Date . Nephrectomy  1999   for CA . Cholecystectomy  1999 . Transurethral resection of bladder tumor N/A 03/04/2013   Procedure: CYSTOSCOPY WITH RIGHT RETROGRADE PYELOGRAM AND BLADDER BIOPSY /CLOT EVACUATION/ BIOPSY PROSTATIC URETHRA WITH FULGERATION ;  Surgeon: Molli Hazard, MD;  Location: [facility];  Service: Urology;  Laterality: N/A; . Cystoscopy w/ retrogrades N/A 04/08/2013   Procedure: CYSTOSCOPY WITH CLOT EVACUATION;  Surgeon: Molli Hazard, MD;  Location: [facility];  Service: Urology;  Laterality: N/A; . Left shoulder repair   . Shoulder open rotator cuff repair Right 05/03/2015   Procedure: RIGHT SHOULDER ROTATOR CUFF REPAIR OPEN WITH GRAFT AND ANCHOR ;  Surgeon: Latanya Maudlin, MD;  Location: [facility];  Service: Orthopedics;  Laterality: Right; HPI: 75 yo male with hemorrhagic shock leading to cardiac arrest, VDRF, AKI from Rt perinephric hematoma. He was intubated 5/03-5/13, 5/16-5/27. He has hx of Lt renal cell carcinoma s/p Lt nephrectomy, prostate cancer s/p XRT, PE in 2008, OSA, HTN, HLD, DM, A fib. Subjective: pt alert, pleasant Assessment / Plan / Recommendation CHL IP CLINICAL IMPRESSIONS 07/05/2015 Therapy Diagnosis Mild oral phase dysphagia;Moderate pharyngeal phase dysphagia Clinical Impression Pt has a mild-moderate oropharyngeal dysphagia with prolonged  intubation and generalized deconditioning resulting in sensorimotor deficits. Oral phase is mildly prolonged with reduced bolus cohesion but ultimately with adequate oral clearance. Thin liquids reach the pyriform sinuses prior to swallow trigger and are subsequently aspirated before the swallow. This does elicit a reflexive cough, but it is not sufficient to expel aspirates. Min cues for smaller boluses of thin liquids results in silent penetration, which does clear with cued throat clearing before it can fall beneath the glottis. He has good airway protection with nectar thick liquids and solids, despite larger quantities. Reduced hyolaryngeal movement, epiglottic inversion, and base of tongue retraction result in moderate pharyngeal residue, but this is reduced with Min cues for second swallows. Given the above, would recommend more conservative diet of Dys 2 textures and nectar thick liquids. Good prognosis for diet advancement with increased time post-extubation and improved overall strength. Impact on safety and function Mild aspiration risk   CHL IP TREATMENT RECOMMENDATION 07/05/2015 Treatment Recommendations Therapy as outlined in treatment plan below   Prognosis 07/05/2015 Prognosis for Safe Diet Advancement Good Barriers to Reach Goals -- Barriers/Prognosis Comment -- CHL IP DIET RECOMMENDATION 07/05/2015 SLP Diet Recommendations Dysphagia 2 (Fine chop) solids;Nectar thick liquid Liquid Administration via Cup;Straw Medication Administration Crushed with puree Compensations Minimize environmental distractions;Slow rate;Small sips/bites;Multiple dry swallows after each bite/sip Postural Changes Remain semi-upright after after feeds/meals (Comment);Seated upright at 90 degrees   CHL IP OTHER RECOMMENDATIONS 07/05/2015 Recommended Consults -- Oral Care Recommendations Oral care BID Other Recommendations Order thickener from pharmacy;Prohibited food (jello, ice cream, thin soups);Remove water pitcher   CHL IP  FOLLOW UP RECOMMENDATIONS 07/05/2015 Follow up Recommendations LTACH   CHL IP FREQUENCY AND DURATION 07/05/2015 Speech Therapy Frequency (ACUTE ONLY) min 2x/week Treatment Duration 2 weeks      CHL IP ORAL PHASE 07/05/2015 Oral Phase Impaired Oral - Pudding Teaspoon -- Oral -  Pudding Cup -- Oral - Honey Teaspoon -- Oral - Honey Cup -- Oral - Nectar Teaspoon Delayed oral transit;Decreased bolus cohesion Oral - Nectar Cup Delayed oral transit;Decreased bolus cohesion Oral - Nectar Straw Delayed oral transit;Decreased bolus cohesion Oral - Thin Teaspoon Delayed oral transit;Decreased bolus cohesion Oral - Thin Cup Delayed oral transit;Decreased bolus cohesion Oral - Thin Straw -- Oral - Puree Delayed oral transit;Decreased bolus cohesion Oral - Mech Soft Delayed oral transit;Decreased bolus cohesion;Impaired mastication Oral - Regular -- Oral - Multi-Consistency -- Oral - Pill -- Oral Phase - Comment --  CHL IP PHARYNGEAL PHASE 07/05/2015 Pharyngeal Phase Impaired Pharyngeal- Pudding Teaspoon -- Pharyngeal -- Pharyngeal- Pudding Cup -- Pharyngeal -- Pharyngeal- Honey Teaspoon -- Pharyngeal -- Pharyngeal- Honey Cup -- Pharyngeal -- Pharyngeal- Nectar Teaspoon Delayed swallow initiation-vallecula;Reduced epiglottic inversion;Reduced anterior laryngeal mobility;Reduced laryngeal elevation;Reduced tongue base retraction;Pharyngeal residue - valleculae Pharyngeal -- Pharyngeal- Nectar Cup Delayed swallow initiation-vallecula;Reduced epiglottic inversion;Reduced anterior laryngeal mobility;Reduced laryngeal elevation;Reduced tongue base retraction;Pharyngeal residue - valleculae Pharyngeal -- Pharyngeal- Nectar Straw Delayed swallow initiation-vallecula;Reduced epiglottic inversion;Reduced anterior laryngeal mobility;Reduced laryngeal elevation;Reduced tongue base retraction;Pharyngeal residue - valleculae Pharyngeal -- Pharyngeal- Thin Teaspoon Delayed swallow initiation-vallecula;Reduced epiglottic inversion;Reduced anterior  laryngeal mobility;Reduced laryngeal elevation;Reduced tongue base retraction;Pharyngeal residue - valleculae Pharyngeal -- Pharyngeal- Thin Cup Reduced epiglottic inversion;Reduced anterior laryngeal mobility;Reduced laryngeal elevation;Reduced tongue base retraction;Pharyngeal residue - valleculae;Delayed swallow initiation-pyriform sinuses;Penetration/Aspiration before swallow Pharyngeal Material enters airway, passes BELOW cords and not ejected out despite cough attempt by patient Pharyngeal- Thin Straw -- Pharyngeal -- Pharyngeal- Puree Delayed swallow initiation-vallecula;Reduced epiglottic inversion;Reduced anterior laryngeal mobility;Reduced laryngeal elevation;Reduced tongue base retraction;Pharyngeal residue - valleculae Pharyngeal -- Pharyngeal- Mechanical Soft Delayed swallow initiation-vallecula;Reduced epiglottic inversion;Reduced anterior laryngeal mobility;Reduced laryngeal elevation;Reduced tongue base retraction;Pharyngeal residue - valleculae Pharyngeal -- Pharyngeal- Regular -- Pharyngeal -- Pharyngeal- Multi-consistency -- Pharyngeal -- Pharyngeal- Pill -- Pharyngeal -- Pharyngeal Comment --  CHL IP CERVICAL ESOPHAGEAL PHASE 07/05/2015 Cervical Esophageal Phase WFL Pudding Teaspoon -- Pudding Cup -- Honey Teaspoon -- Honey Cup -- Nectar Teaspoon -- Nectar Cup -- Nectar Straw -- Thin Teaspoon -- Thin Cup -- Thin Straw -- Puree -- Mechanical Soft -- Regular -- Multi-consistency -- Pill -- Cervical Esophageal Comment -- No flowsheet data found. Germain Osgood, M.A. CCC-SLP [phone] Germain Osgood 07/05/2015, 12:06 PM               Time Spent in minutes  25   Louellen Molder M.D on 07/22/2015 at 11:40 AM  Between 7am to 7pm - Pager - [phone]  After 7pm go to [email or web address] - password [facility]  [facility] -  Office  [phone]

## 2015-07-22 NOTE — Clinical Social Work Note (Addendum)
Contact made with Santiago Glad, admissions director at [facility] to provide medical update on patient. Santiago Glad will make contact with [employer].  Learned from dialysis staff person that patient has been declared chronic and CLIPP process is being initiated.  Jameir Ake Givens, MSW, LCSW Licensed Clinical Social Worker [place] [phone]

## 2015-07-22 NOTE — Progress Notes (Signed)
Pt rec'd meds now ready for CPAP.  Placed pt on CPAP tolerating well.

## 2015-07-23 LAB — CBC
HCT: 27.6 % — ABNORMAL LOW (ref 39.0–52.0)
Hemoglobin: 8.1 g/dL — ABNORMAL LOW (ref 13.0–17.0)
MCH: 26.7 pg (ref 26.0–34.0)
MCHC: 29.3 g/dL — AB (ref 30.0–36.0)
MCV: 91.1 fL (ref 78.0–100.0)
Platelets: 183 10*3/uL (ref 150–400)
RBC: 3.03 MIL/uL — ABNORMAL LOW (ref 4.22–5.81)
RDW: 18.4 % — AB (ref 11.5–15.5)
WBC: 5.1 10*3/uL (ref 4.0–10.5)

## 2015-07-23 LAB — RENAL FUNCTION PANEL
ALBUMIN: 1.4 g/dL — AB (ref 3.5–5.0)
Anion gap: 9 (ref 5–15)
BUN: 49 mg/dL — AB (ref 6–20)
CALCIUM: 7.8 mg/dL — AB (ref 8.9–10.3)
CO2: 26 mmol/L (ref 22–32)
Chloride: 98 mmol/L — ABNORMAL LOW (ref 101–111)
Creatinine, Ser: 4.01 mg/dL — ABNORMAL HIGH (ref 0.61–1.24)
GFR calc Af Amer: 16 mL/min — ABNORMAL LOW (ref >60)
GFR calc non Af Amer: 14 mL/min — ABNORMAL LOW (ref >60)
GLUCOSE: 87 mg/dL (ref 65–99)
PHOSPHORUS: 3.1 mg/dL (ref 2.5–4.6)
Potassium: 3.2 mmol/L — ABNORMAL LOW (ref 3.5–5.1)
SODIUM: 133 mmol/L — AB (ref 135–145)

## 2015-07-23 LAB — GLUCOSE, CAPILLARY
GLUCOSE-CAPILLARY: 145 mg/dL — AB (ref 65–99)
Glucose-Capillary: 67 mg/dL (ref 65–99)
Glucose-Capillary: 124 mg/dL — ABNORMAL HIGH (ref 65–99)

## 2015-07-23 LAB — PROTIME-INR
INR: 2.7 — AB (ref 0.00–1.49)
PROTHROMBIN TIME: 28.3 s — AB (ref 11.6–15.2)

## 2015-07-23 MED ORDER — MIDODRINE HCL 5 MG PO TABS
ORAL_TABLET | ORAL | Status: AC
  Filled 2015-07-23: qty 2

## 2015-07-23 MED ORDER — LORAZEPAM 1 MG PO TABS
1.0000 mg | ORAL_TABLET | Freq: Once | ORAL | Status: AC
  Administered 2015-07-23: 1 mg via ORAL
  Filled 2015-07-23: qty 1

## 2015-07-23 MED ORDER — HEPARIN SODIUM (PORCINE) 1000 UNIT/ML DIALYSIS: 20.0000 [IU]/kg | INTRAMUSCULAR | Status: DC | PRN

## 2015-07-23 MED ORDER — DEXTROSE 5 % IV SOLN
1.5000 g | INTRAVENOUS | Status: AC
  Administered 2015-07-25: 1.5 g via INTRAVENOUS
  Filled 2015-07-23 (×2): qty 1.5

## 2015-07-23 NOTE — Procedures (Signed)
Placed patient on CPAP for the night.  Patient is tolerating well at this time. 

## 2015-07-23 NOTE — Progress Notes (Signed)
PROGRESS NOTE                                                                                                                                                                                                             Patient Demographics:    [name], is a 75 y.o. male, DOB - 1940-09-21, [id number]  Admit date - 05/31/2015   Admitting Physician Rigoberto Noel, MD  Outpatient Primary MD for the patient is [facility]  LOS - 53  Outpatient Specialists:   Chief Complaint  Patient presents with  . Hypotension       Brief Narrative   75 year old male with history of A. fib, DVT and PE on Coumadin, type 2 diabetes mellitus, renal cell carcinoma status post left nephrectomy, dysrhythmia, hyperlipidemia, prostatic CA status post radiation 2011 who was seen in the ED on 4/22 4:00 catheter which was irrigated and replaced. He had hematuria for quite some time which was attributed to scarring from prior radiation. However when he had his INR checked on 4/25 it was 8 and patient was found to be hypotensive with blood pressure 70/44 mmHg and sent to the ED. In the ED patient was started to have UTI and despite open 5 L IV bolus he was hypotensive along with sodium of 124, K of 5, lactate of 3 and serum creatinine of 9.36. Patient admitted to ICU for septic shock secondary to UTI and acute kidney injury.  Significant Events: 4/25 admitted by [facility] w/ septic shock due to UTI + ARF  4/26 Renal u/s > s/p Lt nephrectomy 4/27 CT abd/pelvis > Rt perinephric hematoma 4/30 TR[facility] assumed care  5/02 CT abd/pelvis > increased size of hematoma 5/03 Cardiac arrest w/ severe anemia > coil embolization R kidney by IR; PTX post-CPR 5/08 TTE EF 65-70% 5/16 Reintubated due to airway secretions 5/18 Doppler legs b/l > Acute DVT Rt gastrocnemius vein, age indeterminate DVT Rt femoral vein and popliteal vein > heparin gtt 5/23 TTE Moderate LVH - EF  65-70%. No wall motion abnormalities. LA mildly dilated. No AS or AR.  5/25 LLE Arterial Duplex: No evidence of stenosis or occluded arteries. 5/27- extubated 5/28 off cvvhd 6/4 CorTrak tube removed   Subjective:    No overnight issues. Denies any specific symptoms.   Assessment  & Plan :   Fever on 6/13-14  Likely acute viral illness. Urine culture growing gram-negative rods but could be colonization. No further fever. Blood cultures negative. No antibiotics given.  Acute on chronic kidney disease, now ESRD  Has a tunneled catheter. Plan on permanent access next week. Hemodialysis onTu/Th/Sat schedule  Nephrology following.  He is now long term HD   per renal. Needs to be CLIPed  Septic/hemorrhagic shock with acute blood loss anemia Secondary to right perinephric hematoma. Hemoglobin remained stable. INR therapeutic.  Septic shock due to Klebsiella pyelonephritis and bacteremia. Completed 14 day antibiotic course on 5/10.  Right gastrocnemius vein acute DVT, right femoral and popliteal vein DVTs On Coumadin. Dosing per pharmacy. INR supratherapeutic. Monitor H&H closely.  A. fib rate controlled. Continue Coumadin.  Chronic diastolic CHF.  Volume management with dialysis. Euvolemic  Cardiac arrest on 5/3, followed by pneumothorax Suspected to be due to severe anemia.  OSA on CPAP   Chronic hematuria Patient refusing scheduled Foley flushes complaining of some abdominal discomfort. Advised that it is important to prevent catheter from clotting.  Ischemic changes of left foot Arterial duplex without notable findings. Has palpable distal pulses.    Code Status :Full code  Family Communication  : Wife at bedside  Disposition Plan  :  SNF  Barriers For Discharge : pending to be CLIPED  Consults  :   Nephrology [facility] CM Orthopedics Urology Vascular surgery   Procedures  :  See hospital course  DVT Prophylaxis  :  Coumadin  Lab Results  Component Value  Date   PLT 183 07/23/2015    Antibiotics  :   IV Rocephin 4/28-5/10  Anti-infectives    Start     Dose/Rate Route Frequency Ordered Stop   07/25/15 0600  cefUROXime (ZINACEF) 1.5 g in dextrose 5 % 50 mL IVPB     1.5 g 100 mL/hr over 30 Minutes Intravenous To ShortStay Surgical 07/23/15 0939 07/26/15 0600   06/22/15 2300  vancomycin (VANCOCIN) IVPB 1000 mg/200 mL premix  Status:  Discontinued     1,000 mg 200 mL/hr over 60 Minutes Intravenous Every 24 hours 06/21/15 2118 06/22/15 0921   06/21/15 2300  cefTAZidime (FORTAZ) 2 g in dextrose 5 % 50 mL IVPB  Status:  Discontinued     2 g 100 mL/hr over 30 Minutes Intravenous Every 12 hours 06/21/15 2118 06/22/15 0908   06/21/15 2200  vancomycin (VANCOCIN) 2,000 mg in sodium chloride 0.9 % 500 mL IVPB     2,000 mg 250 mL/hr over 120 Minutes Intravenous  Once 06/21/15 2118 06/22/15 0005   06/17/15 1200  ceFAZolin (ANCEF) 3 g in dextrose 5 % 50 mL IVPB     3 g 130 mL/hr over 30 Minutes Intravenous To Radiology 06/17/15 1156 06/17/15 1251   06/17/15 0924  ceFAZolin (ANCEF) 1-5 GM-% IVPB    Comments:  Covington, Jamie   : cabinet override      06/17/15 0924 06/17/15 1222   06/17/15 0924  ceFAZolin (ANCEF) 2-4 GM/100ML-% IVPB    Comments:  Soyla Dryer   : cabinet override      06/17/15 0924 06/17/15 1222   06/04/15 1100  cefTRIAXone (ROCEPHIN) 2 g in dextrose 5 % 50 mL IVPB     2 g 100 mL/hr over 30 Minutes Intravenous Every 24 hours 06/03/15 1038 06/15/15 1134   06/03/15 1800  cefTAZidime (FORTAZ) 1 g in dextrose 5 % 50 mL IVPB  Status:  Discontinued     1 g 100 mL/hr over 30 Minutes Intravenous Every  24 hours 06/03/15 0737 06/03/15 0956   06/03/15 1100  cefTRIAXone (ROCEPHIN) 1 g in dextrose 5 % 50 mL IVPB  Status:  Discontinued     1 g 100 mL/hr over 30 Minutes Intravenous Every 24 hours 06/03/15 0956 06/03/15 1038   06/02/15 1800  cefTAZidime (FORTAZ) 2 g in dextrose 5 % 50 mL IVPB  Status:  Discontinued     2 g 100 mL/hr over  30 Minutes Intravenous Every 12 hours 06/02/15 1306 06/03/15 0737   05/31/15 1800  cefTAZidime (FORTAZ) 2 g in dextrose 5 % 50 mL IVPB  Status:  Discontinued     2 g 100 mL/hr over 30 Minutes Intravenous Every 48 hours 05/31/15 1444 06/02/15 1306   05/31/15 1130  vancomycin (VANCOCIN) 2,250 mg in sodium chloride 0.9 % 500 mL IVPB  Status:  Discontinued     2,250 mg 250 mL/hr over 120 Minutes Intravenous  Once 05/31/15 1124 05/31/15 1128   05/31/15 1130  vancomycin (VANCOCIN) 2,500 mg in sodium chloride 0.9 % 500 mL IVPB     2,500 mg 250 mL/hr over 120 Minutes Intravenous  Once 05/31/15 1128 05/31/15 1347   05/31/15 1115  piperacillin-tazobactam (ZOSYN) IVPB 3.375 g     3.375 g 100 mL/hr over 30 Minutes Intravenous  Once 05/31/15 1114 05/31/15 1239        Objective:   Filed Vitals:   07/23/15 1030 07/23/15 1100 07/23/15 1103 07/23/15 1153  BP: 104/63 106/69 106/69 102/65  Pulse: 86 85 85 93  Temp:   98.1 F (36.7 C) 98.9 F (37.2 C)  TempSrc:   Oral Oral  Resp: 24 26 26 20   Height:      Weight:   108.9 kg (240 lb 1.3 oz)   SpO2:   100% 98%    Wt Readings from Last 3 Encounters:  07/23/15 108.9 kg (240 lb 1.3 oz)  05/27/15 120.657 kg (266 lb)  05/10/15 126.735 kg (279 lb 6.4 oz)     Intake/Output Summary (Last 24 hours) at 07/23/15 1548 Last data filed at 07/23/15 1103  Gross per 24 hour  Intake      0 ml  Output   1100 ml  Net  -1100 ml     Physical Exam  Gen: not in distress HEENT:  moist mucosa, supple neck Chest: clear b/l, no added sounds , HD catheter over left chest CVS: N S1&S2, no murmurs, rubs or gallop GI: soft, NT, ND, BS+, Chronic foley Musculoskeletal: warm, no edema     Data Review:    CBC  Recent Labs Lab 07/18/15 0413 07/19/15 0344 07/20/15 0520 07/21/15 0644 07/23/15 0705  WBC 5.1 4.4 4.1 4.4 5.1  HGB 8.5* 8.1* 8.3* 8.0* 8.1*  HCT 28.8* 27.0* 28.4* 27.0* 27.6*  PLT 145* 156 155 177 183  MCV 95.4 93.1 93.1 91.5 91.1  MCH 28.1  27.9 27.2 27.1 26.7  MCHC 29.5* 30.0 29.2* 29.6* 29.3*  RDW 18.4* 18.4* 18.5* 18.7* 18.4*    Chemistries   Recent Labs Lab 07/19/15 0344 07/20/15 0520 07/21/15 0644 07/22/15 0845 07/23/15 0706  NA 134* 135 137 136 133*  K 4.3 3.7 4.3 3.4* 3.2*  CL 98* 100* 100* 100* 98*  CO2 27 27 29 26 26   GLUCOSE 91 102* 73 101* 87  BUN 43* 32* 51* 30* 49*  CREATININE 4.62* 3.29* 4.43* 3.19* 4.01*  CALCIUM 7.9* 7.9* 8.0* 8.0* 7.8*   ------------------------------------------------------------------------------------------------------------------ No results for input(s): CHOL, HDL, LDLCALC, TRIG, CHOLHDL, LDLDIRECT in the  last 72 hours.  Lab Results  Component Value Date   HGBA1C 7.8* 06/07/2015   ------------------------------------------------------------------------------------------------------------------ No results for input(s): TSH, T4TOTAL, T3FREE, THYROIDAB in the last 72 hours.  Invalid input(s): FREET3 ------------------------------------------------------------------------------------------------------------------ No results for input(s): VITAMINB12, FOLATE, FERRITIN, TIBC, IRON, RETICCTPCT in the last 72 hours.  Coagulation profile  Recent Labs Lab 07/19/15 0344 07/20/15 0520 07/21/15 0644 07/22/15 0845 07/23/15 0540  INR 3.50* 3.09* 3.87* 2.93* 2.70*    No results for input(s): DDIMER in the last 72 hours.  Cardiac Enzymes No results for input(s): CKMB, TROPONINI, MYOGLOBIN in the last 168 hours.  Invalid input(s): CK ------------------------------------------------------------------------------------------------------------------ No results found for: BNP  Inpatient Medications  Scheduled Meds: . antiseptic oral rinse  7 mL Mouth Rinse BID  . calcitRIOL  0.25 mcg Oral Q M,W,F  . [START ON 07/25/2015] cefUROXime (ZINACEF)  IV  1.5 g Intravenous To SS-Surg  . darbepoetin (ARANESP) injection - DIALYSIS  200 mcg Intravenous Q Tue-HD  . feeding supplement  (PRO-STAT SUGAR FREE 64)  30 mL Oral TID WC  . ferric gluconate (FERRLECIT/NULECIT) IV  125 mg Intravenous Daily  . insulin aspart  0-15 Units Subcutaneous TID WC  . insulin aspart  0-5 Units Subcutaneous QHS  . insulin NPH Human  13 Units Subcutaneous BID AC & HS  . midodrine  10 mg Oral TID AC  . multivitamin  1 tablet Oral QHS  . pantoprazole  40 mg Oral Q1200  . Warfarin - Pharmacist Dosing Inpatient   Does not apply q1800   Continuous Infusions: . sodium chloride Stopped (07/12/15 1200)   PRN Meds:.acetaminophen (TYLENOL) oral liquid 160 mg/5 mL, albuterol, guaiFENesin, ondansetron, oxyCODONE, RESOURCE THICKENUP CLEAR, sodium chloride flush  Micro Results Recent Results (from the past 240 hour(s))  Culture, blood (routine x 2)     Status: None (Preliminary result)   Collection Time: 07/20/15 11:29 AM  Result Value Ref Range Status   Specimen Description BLOOD RIGHT ANTECUBITAL  Final   Special Requests BOTTLES DRAWN AEROBIC AND ANAEROBIC  10CC  Final   Culture NO GROWTH 2 DAYS  Final   Report Status PENDING  Incomplete  Culture, blood (routine x 2)     Status: None (Preliminary result)   Collection Time: 07/20/15 11:50 AM  Result Value Ref Range Status   Specimen Description BLOOD RIGHT ANTECUBITAL  Final   Special Requests BOTTLES DRAWN AEROBIC AND ANAEROBIC  5CC  Final   Culture NO GROWTH 2 DAYS  Final   Report Status PENDING  Incomplete  Culture, Urine     Status: Abnormal (Preliminary result)   Collection Time: 07/20/15  7:17 PM  Result Value Ref Range Status   Specimen Description URINE, CATHETERIZED  Final   Special Requests NONE  Final   Culture >=100,000 COLONIES/mL GRAM NEGATIVE RODS (A)  Final   Report Status PENDING  Incomplete    Radiology Reports Dg Shoulder Right  07/07/2015  CLINICAL DATA:  Right shoulder pain. Recent rotator cuff surgery. No known injury. EXAM: RIGHT SHOULDER - 2+ VIEW COMPARISON:  Single-view of the chest 06/19/2015 and 07/06/2015.  FINDINGS: There is lucency in the greater tuberosity. There appears to be a small focus of cortical disruption present. Mild appearing acromioclavicular degenerative change is seen. Dialysis catheter and feeding tube are noted. IMPRESSION: Small cortical defect in the greater tuberosity of the right humeral head and increased lucency in the greater tuberosity could be due to infection or related to the patient's recent surgery. Projection is different than on  prior chest x-rays but the findings appear more conspicuous. Electronically Signed   By: Inge Rise M.D.   On: 07/07/2015 17:24   Dg Chest Port 1 View  07/20/2015  CLINICAL DATA:  Fevers EXAM: PORTABLE CHEST 1 VIEW COMPARISON:  07/06/2015 FINDINGS: Dialysis catheter is again noted and stable. Cardiac shadow remains enlarged. A feeding catheter is been removed in the interval. The lungs are clear. Healing left rib fractures are noted. IMPRESSION: No acute abnormality noted. Electronically Signed   By: Inez Catalina M.D.   On: 07/20/2015 10:23   Dg Chest Port 1 View  07/06/2015  CLINICAL DATA:  Acute renal failure. EXAM: PORTABLE CHEST 1 VIEW COMPARISON:  06/27/2015 . FINDINGS: Interim extubation. Dialysis catheter and left subclavian central line in stable position. Feeding tube in stable position. Stable cardiomegaly. Persistent left base subsegmental atelectasis and or infiltrate with slight interim progression. No pleural effusion or pneumothorax. IMPRESSION: 1. Interim extubation. Right dialysis catheter left subclavian central line stable position. Feeding tube in stable position. 2. Slight interim progression of left base atelectasis and or infiltrate. Electronically Signed   By: [place]   On: 07/06/2015 07:39   Dg Chest Port 1 View  06/27/2015  CLINICAL DATA:  Respiratory failure.  Endotracheal tube placement EXAM: PORTABLE CHEST 1 VIEW COMPARISON:  06/25/2015 FINDINGS: Endotracheal tube in good position. Feeding tube enters the  stomach with the tip not visualized. Left subclavian central venous catheter tip in the SVC. Right jugular central venous catheter tip in the SVC. These are unchanged. No pneumothorax. Mild left lower lobe airspace disease is unchanged. Negative for heart failure or pleural effusion. IMPRESSION: Support lines remain in good position and unchanged Left lower lobe atelectasis/infiltrate unchanged. Electronically Signed   By: Franchot Gallo M.D.   On: 06/27/2015 07:10   Dg Chest Port 1 View  06/25/2015  CLINICAL DATA:  Respiratory failure.  Subsequent encounter. EXAM: PORTABLE CHEST 1 VIEW COMPARISON:  06/24/2015 FINDINGS: Endotracheal tube, left subclavian central venous line, right-sided large bore central venous catheter and oral/nasogastric tube are stable and well positioned. Left greater than right lung base opacity is also stable. Left base opacity may reflect pneumonia, atelectasis or a combination. Right base opacity is most likely atelectasis. Remainder of the lungs is clear. No convincing pleural effusion or pneumothorax. IMPRESSION: 1. No significant change from the previous day's study. 2. Left left lung base opacity consistent with pneumonia, atelectasis or a combination. Mild right basilar atelectasis. 3. Support apparatus is stable and well positioned. Electronically Signed   By: Lajean Manes M.D.   On: 06/25/2015 10:37   Dg Chest Port 1 View  06/24/2015  CLINICAL DATA:  Hypoxia EXAM: PORTABLE CHEST 1 VIEW COMPARISON:  Jun 23, 2015 FINDINGS: Endotracheal tube tip is 2.4 cm above the carina. Both central catheter tips are in the superior vena cava. Feeding tube tip is below the diaphragm. No pneumothorax. There is airspace consolidation in the left lower lobe. Lungs elsewhere clear. Heart is mildly enlarged with pulmonary vascularity within normal limits. No adenopathy. No bone lesions. IMPRESSION: Persistent left lower lobe consolidation. No new opacity. Stable cardiac silhouette. Tube and  catheter positions as described without pneumothorax. Electronically Signed   By: Lowella Grip III M.D.   On: 06/24/2015 07:27   Dg Swallowing Func-speech Pathology  07/14/2015  Objective Swallowing Evaluation: Type of Study: MBS-Modified Barium Swallow Study Patient Details Name: [name] MRN: [id number] Date of Birth: [birth year] Today's Date: 07/14/2015 Time: SLP Start Time (ACUTE  ONLY): 1341-SLP Stop Time (ACUTE ONLY): 1350 SLP Time Calculation (min) (ACUTE ONLY): 9 min Past Medical History: Past Medical History Diagnosis Date . Obesity  . Phlebitis    Lower extermity . Pulmonary emboli ([place]) 2008   submassive, saddle . Prostate cancer ([place]) 07/2009 . Sleep apnea    on CPAP . Hx of echocardiogram 12/04/2010   Normal EF >55% no significant valve disease . History of stress test 06/27/2009   Low risk and EF of approximately 50% . DVT (deep venous thrombosis) ([place])  . Chronic kidney disease, stage 3    baseline creatinine ~1.4 . HLD (hyperlipidemia)  . HTN (hypertension)  . Dysrhythmia    A fib . Diabetes mellitus ([place])    diet controlled . History of hiatal hernia  Past Surgical History: Past Surgical History Procedure Laterality Date . Nephrectomy  1999   for CA . Cholecystectomy  1999 . Transurethral resection of bladder tumor N/A 03/04/2013   Procedure: CYSTOSCOPY WITH RIGHT RETROGRADE PYELOGRAM AND BLADDER BIOPSY /CLOT EVACUATION/ BIOPSY PROSTATIC URETHRA WITH FULGERATION ;  Surgeon: Molli Hazard, MD;  Location: [facility];  Service: Urology;  Laterality: N/A; . Cystoscopy w/ retrogrades N/A 04/08/2013   Procedure: CYSTOSCOPY WITH CLOT EVACUATION;  Surgeon: Molli Hazard, MD;  Location: [facility];  Service: Urology;  Laterality: N/A; . Left shoulder repair   . Shoulder open rotator cuff repair Right 05/03/2015   Procedure: RIGHT SHOULDER ROTATOR CUFF REPAIR OPEN WITH GRAFT AND ANCHOR ;  Surgeon: Latanya Maudlin, MD;  Location: [facility];  Service: Orthopedics;  Laterality: Right; HPI: 75 yo male with  hemorrhagic shock leading to cardiac arrest, VDRF, AKI from Rt perinephric hematoma. He was intubated 5/03-5/13, 5/16-5/27. He has hx of Lt renal cell carcinoma s/p Lt nephrectomy, prostate cancer s/p XRT, PE in 2008, OSA, HTN, HLD, DM, A fib. Subjective: pt alert, pleasant Assessment / Plan / Recommendation CHL IP CLINICAL IMPRESSIONS 07/14/2015 Therapy Diagnosis Mild pharyngeal phase dysphagia;Moderate pharyngeal phase dysphagia Clinical Impression Pt shows improvements in his mild-moderate oropharyngeal dysphagia since previous MBS. Oral phase overall is strong and effective, although he does have piecemeal swallowing particularly with liquids. He continues to have a brief delay in swallow trigger but now with most boluses being contained within the valleculae prior to initiation. The exception is when he consumes thin liquids via straw, as they reach the pyriform sinuses and are subsequently aspirated before the swallow. Cued coughing is productive of secretions mixed with barium, although it is unclear if all aspirates were expelled. A mild-moderate amount of residue still remains with solid textures. Recommend to continue Dys 2 diet but with advancement to thin liquids by cup. Anticipate further improvements as overall strength continues to progress. Impact on safety and function Mild aspiration risk   CHL IP TREATMENT RECOMMENDATION 07/14/2015 Treatment Recommendations Therapy as outlined in treatment plan below   Prognosis 07/14/2015 Prognosis for Safe Diet Advancement Good Barriers to Reach Goals -- Barriers/Prognosis Comment -- CHL IP DIET RECOMMENDATION 07/14/2015 SLP Diet Recommendations Dysphagia 2 (Fine chop) solids;Thin liquid Liquid Administration via Cup;No straw Medication Administration Whole meds with puree Compensations Slow rate;Small sips/bites;Multiple dry swallows after each bite/sip Postural Changes Remain semi-upright after after feeds/meals (Comment);Seated upright at 90 degrees   CHL IP OTHER  RECOMMENDATIONS 07/14/2015 Recommended Consults -- Oral Care Recommendations Oral care BID Other Recommendations Order thickener from pharmacy;Prohibited food (jello, ice cream, thin soups);Remove water pitcher   CHL IP FOLLOW UP RECOMMENDATIONS 07/14/2015 Follow up Recommendations LTACH   CHL IP  FREQUENCY AND DURATION 07/14/2015 Speech Therapy Frequency (ACUTE ONLY) min 2x/week Treatment Duration 2 weeks      CHL IP ORAL PHASE 07/14/2015 Oral Phase Impaired Oral - Pudding Teaspoon -- Oral - Pudding Cup -- Oral - Honey Teaspoon -- Oral - Honey Cup -- Oral - Nectar Teaspoon NT Oral - Nectar Cup NT Oral - Nectar Straw NT Oral - Thin Teaspoon NT Oral - Thin Cup Piecemeal swallowing Oral - Thin Straw Piecemeal swallowing Oral - Puree WFL Oral - Mech Soft WFL Oral - Regular -- Oral - Multi-Consistency -- Oral - Pill -- Oral Phase - Comment --  CHL IP PHARYNGEAL PHASE 07/14/2015 Pharyngeal Phase Impaired Pharyngeal- Pudding Teaspoon -- Pharyngeal -- Pharyngeal- Pudding Cup -- Pharyngeal -- Pharyngeal- Honey Teaspoon -- Pharyngeal -- Pharyngeal- Honey Cup -- Pharyngeal -- Pharyngeal- Nectar Teaspoon NT Pharyngeal -- Pharyngeal- Nectar Cup NT Pharyngeal -- Pharyngeal- Nectar Straw NT Pharyngeal -- Pharyngeal- Thin Teaspoon NT Pharyngeal -- Pharyngeal- Thin Cup Delayed swallow initiation-vallecula;Reduced tongue base retraction Pharyngeal Material does not enter airway Pharyngeal- Thin Straw Delayed swallow initiation-pyriform sinuses;Penetration/Aspiration before swallow;Reduced tongue base retraction Pharyngeal Material enters airway, passes BELOW cords without attempt by patient to eject out (silent aspiration) Pharyngeal- Puree Delayed swallow initiation-vallecula;Reduced tongue base retraction;Pharyngeal residue - valleculae Pharyngeal -- Pharyngeal- Mechanical Soft Delayed swallow initiation-vallecula;Reduced tongue base retraction;Pharyngeal residue - valleculae Pharyngeal -- Pharyngeal- Regular -- Pharyngeal -- Pharyngeal-  Multi-consistency -- Pharyngeal -- Pharyngeal- Pill -- Pharyngeal -- Pharyngeal Comment --  CHL IP CERVICAL ESOPHAGEAL PHASE 07/14/2015 Cervical Esophageal Phase WFL Pudding Teaspoon -- Pudding Cup -- Honey Teaspoon -- Honey Cup -- Nectar Teaspoon -- Nectar Cup -- Nectar Straw -- Thin Teaspoon -- Thin Cup -- Thin Straw -- Puree -- Mechanical Soft -- Regular -- Multi-consistency -- Pill -- Cervical Esophageal Comment -- No flowsheet data found. Germain Osgood, M.A. CCC-SLP [phone] Germain Osgood 07/14/2015, 2:47 PM              Dg Swallowing Func-speech Pathology  07/05/2015  Objective Swallowing Evaluation: Type of Study: MBS-Modified Barium Swallow Study Patient Details Name: [name] MRN: [id number] Date of Birth: 1940-02-08 Today's Date: 07/05/2015 Time: SLP Start Time (ACUTE ONLY): 1051-SLP Stop Time (ACUTE ONLY): 1114 SLP Time Calculation (min) (ACUTE ONLY): 23 min Past Medical History: Past Medical History Diagnosis Date . Obesity  . Phlebitis    Lower extermity . Pulmonary emboli ([place]) 2008   submassive, saddle . Prostate cancer ([place]) 07/2009 . Sleep apnea    on CPAP . Hx of echocardiogram 12/04/2010   Normal EF >55% no significant valve disease . History of stress test 06/27/2009   Low risk and EF of approximately 50% . DVT (deep venous thrombosis) ([place])  . Chronic kidney disease, stage 3    baseline creatinine ~1.4 . HLD (hyperlipidemia)  . HTN (hypertension)  . Dysrhythmia    A fib . Diabetes mellitus ([place])    diet controlled . History of hiatal hernia  Past Surgical History: Past Surgical History Procedure Laterality Date . Nephrectomy  1999   for CA . Cholecystectomy  1999 . Transurethral resection of bladder tumor N/A 03/04/2013   Procedure: CYSTOSCOPY WITH RIGHT RETROGRADE PYELOGRAM AND BLADDER BIOPSY /CLOT EVACUATION/ BIOPSY PROSTATIC URETHRA WITH FULGERATION ;  Surgeon: Molli Hazard, MD;  Location: [facility];  Service: Urology;  Laterality: N/A; . Cystoscopy w/ retrogrades N/A  04/08/2013   Procedure: CYSTOSCOPY WITH CLOT EVACUATION;  Surgeon: Molli Hazard, MD;  Location: [facility];  Service: Urology;  Laterality: N/A; .  Left shoulder repair   . Shoulder open rotator cuff repair Right 05/03/2015   Procedure: RIGHT SHOULDER ROTATOR CUFF REPAIR OPEN WITH GRAFT AND ANCHOR ;  Surgeon: Latanya Maudlin, MD;  Location: [facility];  Service: Orthopedics;  Laterality: Right; HPI: 75 yo male with hemorrhagic shock leading to cardiac arrest, VDRF, AKI from Rt perinephric hematoma. He was intubated 5/03-5/13, 5/16-5/27. He has hx of Lt renal cell carcinoma s/p Lt nephrectomy, prostate cancer s/p XRT, PE in 2008, OSA, HTN, HLD, DM, A fib. Subjective: pt alert, pleasant Assessment / Plan / Recommendation CHL IP CLINICAL IMPRESSIONS 07/05/2015 Therapy Diagnosis Mild oral phase dysphagia;Moderate pharyngeal phase dysphagia Clinical Impression Pt has a mild-moderate oropharyngeal dysphagia with prolonged intubation and generalized deconditioning resulting in sensorimotor deficits. Oral phase is mildly prolonged with reduced bolus cohesion but ultimately with adequate oral clearance. Thin liquids reach the pyriform sinuses prior to swallow trigger and are subsequently aspirated before the swallow. This does elicit a reflexive cough, but it is not sufficient to expel aspirates. Min cues for smaller boluses of thin liquids results in silent penetration, which does clear with cued throat clearing before it can fall beneath the glottis. He has good airway protection with nectar thick liquids and solids, despite larger quantities. Reduced hyolaryngeal movement, epiglottic inversion, and base of tongue retraction result in moderate pharyngeal residue, but this is reduced with Min cues for second swallows. Given the above, would recommend more conservative diet of Dys 2 textures and nectar thick liquids. Good prognosis for diet advancement with increased time post-extubation and improved overall strength. Impact on  safety and function Mild aspiration risk   [facility] IP TREATMENT RECOMMENDATION 07/05/2015 Treatment Recommendations Therapy as outlined in treatment plan below   Prognosis 07/05/2015 Prognosis for Safe Diet Advancement Good Barriers to Reach Goals -- Barriers/Prognosis Comment -- [facility] IP DIET RECOMMENDATION 07/05/2015 SLP Diet Recommendations Dysphagia 2 (Fine chop) solids;Nectar thick liquid Liquid Administration via Cup;Straw Medication Administration Crushed with puree Compensations Minimize environmental distractions;Slow rate;Small sips/bites;Multiple dry swallows after each bite/sip Postural Changes Remain semi-upright after after feeds/meals (Comment);Seated upright at 90 degrees   [facility] IP OTHER RECOMMENDATIONS 07/05/2015 Recommended Consults -- Oral Care Recommendations Oral care BID Other Recommendations Order thickener from pharmacy;Prohibited food (jello, ice cream, thin soups);Remove water pitcher   [facility] IP FOLLOW UP RECOMMENDATIONS 07/05/2015 Follow up Recommendations LTACH   [facility]L IP FREQUENCY AND DURATION 07/05/2015 Speech Therapy Frequency (ACUTE ONLY) min 2x/week Treatment Duration 2 weeks      [facility]L IP ORAL PHASE 07/05/2015 Oral Phase Impaired Oral - Pudding Teaspoon -- Oral - Pudding Cup -- Oral - Honey Teaspoon -- Oral - Honey Cup -- Oral - Nectar Teaspoon Delayed oral transit;Decreased bolus cohesion Oral - Nectar Cup Delayed oral transit;Decreased bolus cohesion Oral - Nectar Straw Delayed oral transit;Decreased bolus cohesion Oral - Thin Teaspoon Delayed oral transit;Decreased bolus cohesion Oral - Thin Cup Delayed oral transit;Decreased bolus cohesion Oral - Thin Straw -- Oral - Puree Delayed oral transit;Decreased bolus cohesion Oral - Mech Soft Delayed oral transit;Decreased bolus cohesion;Impaired mastication Oral - Regular -- Oral - Multi-Consistency -- Oral - Pill -- Oral Phase - Comment --  [facility]L IP PHARYNGEAL PHASE 07/05/2015 Pharyngeal Phase Impaired Pharyngeal- Pudding Teaspoon -- Pharyngeal --  Pharyngeal- Pudding Cup -- Pharyngeal -- Pharyngeal- Honey Teaspoon -- Pharyngeal -- Pharyngeal- Honey Cup -- Pharyngeal -- Pharyngeal- Nectar Teaspoon Delayed swallow initiation-vallecula;Reduced epiglottic inversion;Reduced anterior laryngeal mobility;Reduced laryngeal elevation;Reduced tongue base retraction;Pharyngeal residue - valleculae Pharyngeal -- Pharyngeal- Nectar Cup Delayed swallow initiation-vallecula;Reduced epiglottic  inversion;Reduced anterior laryngeal mobility;Reduced laryngeal elevation;Reduced tongue base retraction;Pharyngeal residue - valleculae Pharyngeal -- Pharyngeal- Nectar Straw Delayed swallow initiation-vallecula;Reduced epiglottic inversion;Reduced anterior laryngeal mobility;Reduced laryngeal elevation;Reduced tongue base retraction;Pharyngeal residue - valleculae Pharyngeal -- Pharyngeal- Thin Teaspoon Delayed swallow initiation-vallecula;Reduced epiglottic inversion;Reduced anterior laryngeal mobility;Reduced laryngeal elevation;Reduced tongue base retraction;Pharyngeal residue - valleculae Pharyngeal -- Pharyngeal- Thin Cup Reduced epiglottic inversion;Reduced anterior laryngeal mobility;Reduced laryngeal elevation;Reduced tongue base retraction;Pharyngeal residue - valleculae;Delayed swallow initiation-pyriform sinuses;Penetration/Aspiration before swallow Pharyngeal Material enters airway, passes BELOW cords and not ejected out despite cough attempt by patient Pharyngeal- Thin Straw -- Pharyngeal -- Pharyngeal- Puree Delayed swallow initiation-vallecula;Reduced epiglottic inversion;Reduced anterior laryngeal mobility;Reduced laryngeal elevation;Reduced tongue base retraction;Pharyngeal residue - valleculae Pharyngeal -- Pharyngeal- Mechanical Soft Delayed swallow initiation-vallecula;Reduced epiglottic inversion;Reduced anterior laryngeal mobility;Reduced laryngeal elevation;Reduced tongue base retraction;Pharyngeal residue - valleculae Pharyngeal -- Pharyngeal- Regular --  Pharyngeal -- Pharyngeal- Multi-consistency -- Pharyngeal -- Pharyngeal- Pill -- Pharyngeal -- Pharyngeal Comment --  CHL IP CERVICAL ESOPHAGEAL PHASE 07/05/2015 Cervical Esophageal Phase WFL Pudding Teaspoon -- Pudding Cup -- Honey Teaspoon -- Honey Cup -- Nectar Teaspoon -- Nectar Cup -- Nectar Straw -- Thin Teaspoon -- Thin Cup -- Thin Straw -- Puree -- Mechanical Soft -- Regular -- Multi-consistency -- Pill -- Cervical Esophageal Comment -- No flowsheet data found. Germain Osgood, M.A. CCC-SLP [phone] Germain Osgood 07/05/2015, 12:06 PM               Time Spent in minutes  20   Louellen Molder M.D on 07/23/2015 at 3:48 PM  Between 7am to 7pm - Pager - [phone]  After 7pm go to [email or web address] - password [facility]  [facility] -  Office  [phone]

## 2015-07-23 NOTE — Procedures (Signed)
Patient was seen on dialysis and the procedure was supervised.  BFR 300  Via PC BP is  90/51.   Patient appears to be tolerating treatment well  [name] A 07/23/2015

## 2015-07-23 NOTE — Progress Notes (Signed)
Subjective: Seen on HD-  Objective Vital signs in last 24 hours: Filed Vitals:   07/23/15 0700 07/23/15 0730 07/23/15 0800 07/23/15 0830  BP: 112/59 98/39 85/54  90/51  Pulse: 96 20 98 91  Temp:      TempSrc:      Resp:   25 25  Height:      Weight:      SpO2:       Weight change: 1.3 kg (2 lb 13.9 oz)  Intake/Output Summary (Last 24 hours) at 07/23/15 0856 Last data filed at 07/23/15 0411  Gross per 24 hour  Intake    240 ml  Output    100 ml  Net    140 ml    Assessment/ Plan: Pt is a 75 y.o. yo male who was admitted on 05/31/2015 with hematuria/hematoma and BOO of solitary kidney- has been HD dependent since 4/26 Assessment/Plan: 1. 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  Patient understands that this is most likely permanent at this time- running on TTS schedule here via PC- HD today  2. GU- has had indwelling cath for 12 years- still with some UOP- to continue 3. Anemia- hgb 8- on darbe- also iron low- repleting 4. Secondary hyperparathyroidism- PTH was 65- is on low dose calcitriol- no binder- phos is 3.1 5. HTN/volume- BP low- no meds- on midodrine- has pitting edema- BP limits UF  6. Dispo- per pt has been accepted at [facility]- once gets AVF and coumadin back on board we should have OP dialysis unit for him   Earnestene Angello A    Labs: Basic Metabolic Panel:  Recent Labs Lab 07/21/15 0644 07/22/15 0845 07/23/15 0706  NA 137 136 133*  K 4.3 3.4* 3.2*  CL 100* 100* 98*  CO2 29 26 26   GLUCOSE 73 101* 87  BUN 51* 30* 49*  CREATININE 4.43* 3.19* 4.01*  CALCIUM 8.0* 8.0* 7.8*  PHOS 4.0 3.0 3.1   Liver Function Tests:  Recent Labs Lab 07/21/15 0644 07/22/15 0845 07/23/15 0706  ALBUMIN 1.5* 1.6* 1.4*   No results for input(s): LIPASE, AMYLASE in the  last 168 hours. No results for input(s): AMMONIA in the last 168 hours. CBC:  Recent Labs Lab 07/18/15 0413 07/19/15 0344 07/20/15 0520 07/21/15 0644 07/23/15 0705  WBC 5.1 4.4 4.1 4.4 5.1  HGB 8.5* 8.1* 8.3* 8.0* 8.1*  HCT 28.8* 27.0* 28.4* 27.0* 27.6*  MCV 95.4 93.1 93.1 91.5 91.1  PLT 145* 156 155 177 183   Cardiac Enzymes: No results for input(s): CKTOTAL, CKMB, CKMBINDEX, TROPONINI in the last 168 hours. CBG:  Recent Labs Lab 07/22/15 0739 07/22/15 1201 07/22/15 1659 07/22/15 2115 07/22/15 2349  GLUCAP 76 98 105* 110* 100*    Iron Studies: No results for input(s): IRON, TIBC, TRANSFERRIN, FERRITIN in the last 72 hours. Studies/Results: No results found. Medications: Infusions: . sodium chloride Stopped (07/12/15 1200)    Scheduled Medications: . antiseptic oral rinse  7 mL Mouth Rinse BID  . calcitRIOL  0.25 mcg Oral Q M,W,F  . darbepoetin (ARANESP) injection - DIALYSIS  200 mcg Intravenous Q Tue-HD  . feeding supplement (PRO-STAT SUGAR FREE 64)  30 mL Oral TID WC  . ferric gluconate (FERRLECIT/NULECIT) IV  125 mg Intravenous Daily  . insulin aspart  0-15 Units Subcutaneous TID WC  .  insulin aspart  0-5 Units Subcutaneous QHS  . insulin NPH Human  13 Units Subcutaneous BID AC & HS  . midodrine  10 mg Oral TID AC  . multivitamin  1 tablet Oral QHS  . pantoprazole  40 mg Oral Q1200  . Warfarin - Pharmacist Dosing Inpatient   Does not apply q1800    have reviewed scheduled and prn medications.  Physical Exam: General: NAD- looks debilitated- whispers Heart: RRR Lungs: clear Abdomen: obese, soft, non tender Extremities: min edema Dialysis Access: right sided PC    07/23/2015,8:56 AM  LOS: 53 days

## 2015-07-23 NOTE — Progress Notes (Signed)
ANTICOAGULATION CONSULT NOTE   Pharmacy Consult: heparin when INR < 2  Indication: hx afib/DVT/PE and acute DVT  No Known Allergies  Patient Measurements: Height: 5' 9.5" (176.5 cm) Weight: 242 lb 8.1 oz (110 kg) (Bedscale) IBW/kg (Calculated) : 71.85   Vital Signs: Temp: 99.5 F (37.5 C) (06/17 0645) Temp Source: Oral (06/17 0645) BP: 104/63 mmHg (06/17 1030) Pulse Rate: 86 (06/17 1030)  Labs:  Recent Labs  07/21/15 0644 07/22/15 0845 07/23/15 0540 07/23/15 0705 07/23/15 0706  HGB 8.0*  --   --  8.1*  --   HCT 27.0*  --   --  27.6*  --   PLT 177  --   --  183  --   LABPROT 37.1* 30.1* 28.3*  --   --   INR 3.87* 2.93* 2.70*  --   --   CREATININE 4.43* 3.19*  --   --  4.01*    Estimated Creatinine Clearance: 20.2 mL/min (by C-G formula based on Cr of 4.01).  Assessment: 75 yo Villegas on coumadin pta for hx afib, DVT/PE. Warfarin currently on hold due to planned procedure on Monday, 06/19, with plan to bridge with heparin in the interim.  INR down to 2.70 from 3.87 on 6/15. CBC stable with no sxs of bleeding.   Home PTA dose: 6mg  MWF, 5mg  all other days  Goal of Therapy:  INR 2-3 Monitor platelets by anticoagulation protocol: Yes   Plan:  Continue to hold warfarin for planned procedure on 6/19 When INR < 2 will begin bridging with heparin; f/u on INR 6/17 am   Joya San, PharmD Clinical Pharmacy Resident Pager # [phone] 07/23/2015 10:56 AM

## 2015-07-24 LAB — URINE CULTURE: Culture: >=100000 — AB

## 2015-07-24 LAB — PROTIME-INR
INR: 2.52 — ABNORMAL HIGH (ref 0.00–1.49)
Prothrombin Time: 26.8 seconds — ABNORMAL HIGH (ref 11.6–15.2)

## 2015-07-24 LAB — GLUCOSE, CAPILLARY
GLUCOSE-CAPILLARY: 135 mg/dL — AB (ref 65–99)
GLUCOSE-CAPILLARY: 149 mg/dL — AB (ref 65–99)
GLUCOSE-CAPILLARY: 84 mg/dL (ref 65–99)
Glucose-Capillary: 102 mg/dL — ABNORMAL HIGH (ref 65–99)

## 2015-07-24 MED ORDER — LORAZEPAM 1 MG PO TABS
1.0000 mg | ORAL_TABLET | Freq: Every day | ORAL | Status: DC
  Administered 2015-07-24 – 2015-07-30 (×7): 1 mg via ORAL
  Filled 2015-07-24 (×7): qty 1

## 2015-07-24 NOTE — Progress Notes (Signed)
ANTICOAGULATION CONSULT NOTE   Pharmacy Consult: heparin when INR < 2  Indication: hx afib/DVT/PE and acute DVT  No Known Allergies  Patient Measurements: Height: 5' 9.5" (176.5 cm) Weight: 240 lb 1.3 oz (108.9 kg) (bedscale) IBW/kg (Calculated) : 71.85   Vital Signs: Temp: 99.3 F (37.4 C) (06/18 0422) Temp Source: Oral (06/18 0422) BP: 115/71 mmHg (06/18 0422) Pulse Rate: 99 (06/18 0422)  Labs:  Recent Labs  07/22/15 0845 07/23/15 0540 07/23/15 0705 07/23/15 0706 07/24/15 0400  HGB  --   --  8.1*  --   --   HCT  --   --  27.6*  --   --   PLT  --   --  183  --   --   LABPROT 30.1* 28.3*  --   --  26.8*  INR 2.93* 2.70*  --   --  2.52*  CREATININE 3.19*  --   --  4.01*  --     Estimated Creatinine Clearance: 20.1 mL/min (by C-G formula based on Cr of 4.01).  Assessment: 75 yo M on coumadin pta for hx afib, DVT/PE. Warfarin currently on hold due to planned procedure on Monday, 06/19, with plan to bridge with heparin in the interim.  INR remains > 2. CBC stable with no sxs of bleeding.   Home PTA dose: 6mg  MWF, 5mg  all other days  Goal of Therapy:  INR 2-3 Monitor platelets by anticoagulation protocol: Yes   Plan:  Continue to hold warfarin for planned procedure on 6/19 When INR < 2 will begin bridging with heparin  Joya San, PharmD Clinical Pharmacy Resident Pager # [phone] 07/24/2015 8:28 AM

## 2015-07-24 NOTE — Progress Notes (Signed)
PROGRESS NOTE                                                                                                                                                                                                             Patient Demographics:    [name], is a 75 y.o. male, DOB - 12/06/1940, [id number]  Admit date - 05/31/2015   Admitting Physician Rigoberto Noel, MD  Outpatient Primary MD for the patient is ROSS,ALAN  LOS - 54  Outpatient Specialists:   Chief Complaint  Patient presents with  . Hypotension       Brief Narrative   75 year old male with history of A. fib, DVT and PE on Coumadin, type 2 diabetes mellitus, renal cell carcinoma status post left nephrectomy, dysrhythmia, hyperlipidemia, prostatic CA status post radiation 2011 who was seen in the ED on 4/22 4:00 catheter which was irrigated and replaced. He had hematuria for quite some time which was attributed to scarring from prior radiation. However when he had his INR checked on 4/25 it was 8 and patient was found to be hypotensive with blood pressure 70/44 mmHg and sent to the ED. In the ED patient was started to have UTI and despite open 5 L IV bolus he was hypotensive along with sodium of 124, K of 5, lactate of 3 and serum creatinine of 9.36. Patient admitted to ICU for septic shock secondary to UTI and acute kidney injury.  Significant Events: 4/25 admitted by [facility] w/ septic shock due to UTI + ARF  4/26 Renal u/s > s/p Lt nephrectomy 4/27 CT abd/pelvis > Rt perinephric hematoma 4/30 [facility] assumed care  5/02 CT abd/pelvis > increased size of hematoma 5/03 Cardiac arrest w/ severe anemia > coil embolization R kidney by IR; PTX post-CPR 5/08 TTE EF 65-70% 5/16 Reintubated due to airway secretions 5/18 Doppler legs b/l > Acute DVT Rt gastrocnemius vein, age indeterminate DVT Rt femoral vein and popliteal vein > heparin gtt 5/23 TTE Moderate LVH - EF  65-70%. No wall motion abnormalities. LA mildly dilated. No AS or AR.  5/25 LLE Arterial Duplex: No evidence of stenosis or occluded arteries. 5/27- extubated 5/28 off cvvhd 6/4 CorTrak tube removed   Subjective:    No overnight issues.    Assessment  & Plan :   Fever on 6/13-14 Likely acute viral  illness. Urine culture growing gram-negative rods but could be colonization. No further fever. Blood cultures negative. No antibiotics given.  Acute on chronic kidney disease, now ESRD  Has a tunneled catheter. Plan on permanent access next week. Hemodialysis onTu/Th/Sat schedule  Nephrology following.scheduled for AV fistula this week.  He is now long term HD   per renal. Needs to be CLIPed  Septic/hemorrhagic shock with acute blood loss anemia Secondary to right perinephric hematoma. Hemoglobin remained stable. INR therapeutic.  Septic shock due to Klebsiella pyelonephritis and bacteremia. Completed 14 day antibiotic course on 5/10.  Right gastrocnemius vein acute DVT, right femoral and popliteal vein DVTs On Coumadin. Dosing per pharmacy.Monitor H&H closely.  A. fib rate controlled. Hold coumadin for AV fistula placement tomorrow.  Chronic diastolic CHF.  Volume management with dialysis. Euvolemic  Cardiac arrest on 5/3, followed by pneumothorax Suspected to be due to severe anemia.  OSA on CPAP   Chronic hematuria Scheduled foley flushes  Ischemic changes of left foot Arterial duplex without notable findings. Has palpable distal pulses.    Code Status :Full code  Family Communication  : Wife at bedside  Disposition Plan  :  SNF  Barriers For Discharge : pending to be CLIPED  Consults  :   Nephrology [facility] CM Orthopedics Urology Vascular surgery   Procedures  :  See hospital course  DVT Prophylaxis  :  Coumadin  Lab Results  Component Value Date   PLT 183 07/23/2015    Antibiotics  :   IV Rocephin 4/28-5/10  Anti-infectives    Start      Dose/Rate Route Frequency Ordered Stop   07/25/15 0600  cefUROXime (ZINACEF) 1.5 g in dextrose 5 % 50 mL IVPB     1.5 g 100 mL/hr over 30 Minutes Intravenous To ShortStay Surgical 07/23/15 0939 07/26/15 0600   06/22/15 2300  vancomycin (VANCOCIN) IVPB 1000 mg/200 mL premix  Status:  Discontinued     1,000 mg 200 mL/hr over 60 Minutes Intravenous Every 24 hours 06/21/15 2118 06/22/15 0921   06/21/15 2300  cefTAZidime (FORTAZ) 2 g in dextrose 5 % 50 mL IVPB  Status:  Discontinued     2 g 100 mL/hr over 30 Minutes Intravenous Every 12 hours 06/21/15 2118 06/22/15 0908   06/21/15 2200  vancomycin (VANCOCIN) 2,000 mg in sodium chloride 0.9 % 500 mL IVPB     2,000 mg 250 mL/hr over 120 Minutes Intravenous  Once 06/21/15 2118 06/22/15 0005   06/17/15 1200  ceFAZolin (ANCEF) 3 g in dextrose 5 % 50 mL IVPB     3 g 130 mL/hr over 30 Minutes Intravenous To Radiology 06/17/15 1156 06/17/15 1251   06/17/15 0924  ceFAZolin (ANCEF) 1-5 GM-% IVPB    Comments:  Covington, Jamie   : cabinet override      06/17/15 0924 06/17/15 1222   06/17/15 0924  ceFAZolin (ANCEF) 2-4 GM/100ML-% IVPB    Comments:  Soyla Dryer   : cabinet override      06/17/15 0924 06/17/15 1222   06/04/15 1100  cefTRIAXone (ROCEPHIN) 2 g in dextrose 5 % 50 mL IVPB     2 g 100 mL/hr over 30 Minutes Intravenous Every 24 hours 06/03/15 1038 06/15/15 1134   06/03/15 1800  cefTAZidime (FORTAZ) 1 g in dextrose 5 % 50 mL IVPB  Status:  Discontinued     1 g 100 mL/hr over 30 Minutes Intravenous Every 24 hours 06/03/15 0737 06/03/15 0956   06/03/15 1100  cefTRIAXone (ROCEPHIN)  1 g in dextrose 5 % 50 mL IVPB  Status:  Discontinued     1 g 100 mL/hr over 30 Minutes Intravenous Every 24 hours 06/03/15 0956 06/03/15 1038   06/02/15 1800  cefTAZidime (FORTAZ) 2 g in dextrose 5 % 50 mL IVPB  Status:  Discontinued     2 g 100 mL/hr over 30 Minutes Intravenous Every 12 hours 06/02/15 1306 06/03/15 0737   05/31/15 1800  cefTAZidime (FORTAZ) 2  g in dextrose 5 % 50 mL IVPB  Status:  Discontinued     2 g 100 mL/hr over 30 Minutes Intravenous Every 48 hours 05/31/15 1444 06/02/15 1306   05/31/15 1130  vancomycin (VANCOCIN) 2,250 mg in sodium chloride 0.9 % 500 mL IVPB  Status:  Discontinued     2,250 mg 250 mL/hr over 120 Minutes Intravenous  Once 05/31/15 1124 05/31/15 1128   05/31/15 1130  vancomycin (VANCOCIN) 2,500 mg in sodium chloride 0.9 % 500 mL IVPB     2,500 mg 250 mL/hr over 120 Minutes Intravenous  Once 05/31/15 1128 05/31/15 1347   05/31/15 1115  piperacillin-tazobactam (ZOSYN) IVPB 3.375 g     3.375 g 100 mL/hr over 30 Minutes Intravenous  Once 05/31/15 1114 05/31/15 1239        Objective:   Filed Vitals:   07/23/15 2258 07/24/15 0422 07/24/15 0833 07/24/15 1059  BP:  115/71 108/58   Pulse: 86 99 103   Temp:  99.3 F (37.4 C) 98.4 F (36.9 C)   TempSrc:  Oral Oral   Resp: 18 19 17    Height:      Weight:    109.589 kg (241 lb 9.6 oz)  SpO2: 96% 100% 97%     Wt Readings from Last 3 Encounters:  07/24/15 109.589 kg (241 lb 9.6 oz)  05/27/15 120.657 kg (266 lb)  05/10/15 126.735 kg (279 lb 6.4 oz)     Intake/Output Summary (Last 24 hours) at 07/24/15 1419 Last data filed at 07/24/15 1046  Gross per 24 hour  Intake    560 ml  Output    550 ml  Net     10 ml     Physical Exam  Gen: not in distress HEENT:  moist mucosa, supple neck Chest: clear b/l, no added sounds , HD catheter over left chest CVS:  S1&S2 irregular, no murmurs,  GI: soft, NT, ND, BS+, Chronic foley Musculoskeletal: warm, no edema     Data Review:    CBC  Recent Labs Lab 07/18/15 0413 07/19/15 0344 07/20/15 0520 07/21/15 0644 07/23/15 0705  WBC 5.1 4.4 4.1 4.4 5.1  HGB 8.5* 8.1* 8.3* 8.0* 8.1*  HCT 28.8* 27.0* 28.4* 27.0* 27.6*  PLT 145* 156 155 177 183  MCV 95.4 93.1 93.1 91.5 91.1  MCH 28.1 27.9 27.2 27.1 26.7  MCHC 29.5* 30.0 29.2* 29.6* 29.3*  RDW 18.4* 18.4* 18.5* 18.7* 18.4*    Chemistries   Recent  Labs Lab 07/19/15 0344 07/20/15 0520 07/21/15 0644 07/22/15 0845 07/23/15 0706  NA 134* 135 137 136 133*  K 4.3 3.7 4.3 3.4* 3.2*  CL 98* 100* 100* 100* 98*  CO2 27 27 29 26 26   GLUCOSE 91 102* 73 101* 87  BUN 43* 32* 51* 30* 49*  CREATININE 4.62* 3.29* 4.43* 3.19* 4.01*  CALCIUM 7.9* 7.9* 8.0* 8.0* 7.8*   ------------------------------------------------------------------------------------------------------------------ No results for input(s): CHOL, HDL, LDLCALC, TRIG, CHOLHDL, LDLDIRECT in the last 72 hours.  Lab Results  Component Value Date  HGBA1C 7.8* 06/07/2015   ------------------------------------------------------------------------------------------------------------------ No results for input(s): TSH, T4TOTAL, T3FREE, THYROIDAB in the last 72 hours.  Invalid input(s): FREET3 ------------------------------------------------------------------------------------------------------------------ No results for input(s): VITAMINB12, FOLATE, FERRITIN, TIBC, IRON, RETICCTPCT in the last 72 hours.  Coagulation profile  Recent Labs Lab 07/20/15 0520 07/21/15 0644 07/22/15 0845 07/23/15 0540 07/24/15 0400  INR 3.09* 3.87* 2.93* 2.70* 2.52*    No results for input(s): DDIMER in the last 72 hours.  Cardiac Enzymes No results for input(s): CKMB, TROPONINI, MYOGLOBIN in the last 168 hours.  Invalid input(s): CK ------------------------------------------------------------------------------------------------------------------ No results found for: BNP  Inpatient Medications  Scheduled Meds: . antiseptic oral rinse  7 mL Mouth Rinse BID  . calcitRIOL  0.25 mcg Oral Q M,W,F  . [START ON 07/25/2015] cefUROXime (ZINACEF)  IV  1.5 g Intravenous To SS-Surg  . darbepoetin (ARANESP) injection - DIALYSIS  200 mcg Intravenous Q Tue-HD  . feeding supplement (PRO-STAT SUGAR FREE 64)  30 mL Oral TID WC  . ferric gluconate (FERRLECIT/NULECIT) IV  125 mg Intravenous Daily  .  insulin aspart  0-15 Units Subcutaneous TID WC  . insulin aspart  0-5 Units Subcutaneous QHS  . insulin NPH Human  13 Units Subcutaneous BID AC & HS  . midodrine  10 mg Oral TID AC  . multivitamin  1 tablet Oral QHS  . pantoprazole  40 mg Oral Q1200  . Warfarin - Pharmacist Dosing Inpatient   Does not apply q1800   Continuous Infusions: . sodium chloride Stopped (07/12/15 1200)   PRN Meds:.acetaminophen (TYLENOL) oral liquid 160 mg/5 mL, albuterol, guaiFENesin, ondansetron, oxyCODONE, RESOURCE THICKENUP CLEAR, sodium chloride flush  Micro Results Recent Results (from the past 240 hour(s))  Culture, blood (routine x 2)     Status: None (Preliminary result)   Collection Time: 07/20/15 11:29 AM  Result Value Ref Range Status   Specimen Description BLOOD RIGHT ANTECUBITAL  Final   Special Requests BOTTLES DRAWN AEROBIC AND ANAEROBIC  10CC  Final   Culture NO GROWTH 3 DAYS  Final   Report Status PENDING  Incomplete  Culture, blood (routine x 2)     Status: None (Preliminary result)   Collection Time: 07/20/15 11:50 AM  Result Value Ref Range Status   Specimen Description BLOOD RIGHT ANTECUBITAL  Final   Special Requests BOTTLES DRAWN AEROBIC AND ANAEROBIC  5CC  Final   Culture NO GROWTH 3 DAYS  Final   Report Status PENDING  Incomplete  Culture, Urine     Status: Abnormal   Collection Time: 07/20/15  7:17 PM  Result Value Ref Range Status   Specimen Description URINE, CATHETERIZED  Final   Special Requests NONE  Final   Culture (A)  Final    >=100,000 COLONIES/mL KLEBSIELLA PNEUMONIAE Confirmed Extended Spectrum Beta-Lactamase Producer (ESBL)    Report Status 07/24/2015 FINAL  Final   Organism ID, Bacteria KLEBSIELLA PNEUMONIAE (A)  Final      Susceptibility   Klebsiella pneumoniae - MIC*    AMPICILLIN >=32 RESISTANT Resistant     CEFAZOLIN 8 RESISTANT Resistant     CEFTRIAXONE <=1 RESISTANT Resistant     CIPROFLOXACIN 0.5 SENSITIVE Sensitive     GENTAMICIN 4 SENSITIVE  Sensitive     IMIPENEM <=0.25 SENSITIVE Sensitive     NITROFURANTOIN 32 SENSITIVE Sensitive     TRIMETH/SULFA <=20 SENSITIVE Sensitive     AMPICILLIN/SULBACTAM 4 SENSITIVE Sensitive     PIP/TAZO <=4 SENSITIVE Sensitive     * >=100,000 COLONIES/mL KLEBSIELLA PNEUMONIAE  Radiology Reports Dg Shoulder Right  07/07/2015  CLINICAL DATA:  Right shoulder pain. Recent rotator cuff surgery. No known injury. EXAM: RIGHT SHOULDER - 2+ VIEW COMPARISON:  Single-view of the chest 06/19/2015 and 07/06/2015. FINDINGS: There is lucency in the greater tuberosity. There appears to be a small focus of cortical disruption present. Mild appearing acromioclavicular degenerative change is seen. Dialysis catheter and feeding tube are noted. IMPRESSION: Small cortical defect in the greater tuberosity of the right humeral head and increased lucency in the greater tuberosity could be due to infection or related to the patient's recent surgery. Projection is different than on prior chest x-rays but the findings appear more conspicuous. Electronically Signed   By: Inge Rise M.D.   On: 07/07/2015 17:24   Dg Chest Port 1 View  07/20/2015  CLINICAL DATA:  Fevers EXAM: PORTABLE CHEST 1 VIEW COMPARISON:  07/06/2015 FINDINGS: Dialysis catheter is again noted and stable. Cardiac shadow remains enlarged. A feeding catheter is been removed in the interval. The lungs are clear. Healing left rib fractures are noted. IMPRESSION: No acute abnormality noted. Electronically Signed   By: Inez Catalina M.D.   On: 07/20/2015 10:23   Dg Chest Port 1 View  07/06/2015  CLINICAL DATA:  Acute renal failure. EXAM: PORTABLE CHEST 1 VIEW COMPARISON:  06/27/2015 . FINDINGS: Interim extubation. Dialysis catheter and left subclavian central line in stable position. Feeding tube in stable position. Stable cardiomegaly. Persistent left base subsegmental atelectasis and or infiltrate with slight interim progression. No pleural effusion or pneumothorax.  IMPRESSION: 1. Interim extubation. Right dialysis catheter left subclavian central line stable position. Feeding tube in stable position. 2. Slight interim progression of left base atelectasis and or infiltrate. Electronically Signed   By: [place]   On: 07/06/2015 07:39   Dg Chest Port 1 View  06/27/2015  CLINICAL DATA:  Respiratory failure.  Endotracheal tube placement EXAM: PORTABLE CHEST 1 VIEW COMPARISON:  06/25/2015 FINDINGS: Endotracheal tube in good position. Feeding tube enters the stomach with the tip not visualized. Left subclavian central venous catheter tip in the SVC. Right jugular central venous catheter tip in the SVC. These are unchanged. No pneumothorax. Mild left lower lobe airspace disease is unchanged. Negative for heart failure or pleural effusion. IMPRESSION: Support lines remain in good position and unchanged Left lower lobe atelectasis/infiltrate unchanged. Electronically Signed   By: Franchot Gallo M.D.   On: 06/27/2015 07:10   Dg Chest Port 1 View  06/25/2015  CLINICAL DATA:  Respiratory failure.  Subsequent encounter. EXAM: PORTABLE CHEST 1 VIEW COMPARISON:  06/24/2015 FINDINGS: Endotracheal tube, left subclavian central venous line, right-sided large bore central venous catheter and oral/nasogastric tube are stable and well positioned. Left greater than right lung base opacity is also stable. Left base opacity may reflect pneumonia, atelectasis or a combination. Right base opacity is most likely atelectasis. Remainder of the lungs is clear. No convincing pleural effusion or pneumothorax. IMPRESSION: 1. No significant change from the previous day's study. 2. Left left lung base opacity consistent with pneumonia, atelectasis or a combination. Mild right basilar atelectasis. 3. Support apparatus is stable and well positioned. Electronically Signed   By: Lajean Manes M.D.   On: 06/25/2015 10:37   Dg Swallowing Func-speech Pathology  07/14/2015  Objective Swallowing  Evaluation: Type of Study: MBS-Modified Barium Swallow Study Patient Details Name: JONERIC DEVEREUX MRN: [id number] Date of Birth: 12-Feb-1940 Today's Date: 07/14/2015 Time: SLP Start Time (ACUTE ONLY): 1341-SLP Stop Time (ACUTE ONLY): 1350 SLP Time  Calculation (min) (ACUTE ONLY): 9 min Past Medical History: Past Medical History Diagnosis Date . Obesity  . Phlebitis    Lower extermity . Pulmonary emboli ([place]) 2008   submassive, saddle . Prostate cancer ([place]) 07/2009 . Sleep apnea    on CPAP . Hx of echocardiogram 12/04/2010   Normal EF >55% no significant valve disease . History of stress test 06/27/2009   Low risk and EF of approximately 50% . DVT (deep venous thrombosis) ([place])  . Chronic kidney disease, stage 3    baseline creatinine ~1.4 . HLD (hyperlipidemia)  . HTN (hypertension)  . Dysrhythmia    A fib . Diabetes mellitus ([place])    diet controlled . History of hiatal hernia  Past Surgical History: Past Surgical History Procedure Laterality Date . Nephrectomy  1999   for CA . Cholecystectomy  1999 . Transurethral resection of bladder tumor N/A 03/04/2013   Procedure: CYSTOSCOPY WITH RIGHT RETROGRADE PYELOGRAM AND BLADDER BIOPSY /CLOT EVACUATION/ BIOPSY PROSTATIC URETHRA WITH FULGERATION ;  Surgeon: Molli Hazard, MD;  Location: [facility];  Service: Urology;  Laterality: N/A; . Cystoscopy w/ retrogrades N/A 04/08/2013   Procedure: CYSTOSCOPY WITH CLOT EVACUATION;  Surgeon: Molli Hazard, MD;  Location: [facility];  Service: Urology;  Laterality: N/A; . Left shoulder repair   . Shoulder open rotator cuff repair Right 05/03/2015   Procedure: RIGHT SHOULDER ROTATOR CUFF REPAIR OPEN WITH GRAFT AND ANCHOR ;  Surgeon: Latanya Maudlin, MD;  Location: [facility];  Service: Orthopedics;  Laterality: Right; HPI: 74 yo male with hemorrhagic shock leading to cardiac arrest, VDRF, AKI from Rt perinephric hematoma. He was intubated 5/03-5/13, 5/16-5/27. He has hx of Lt renal cell carcinoma s/p Lt nephrectomy, prostate cancer s/p XRT,  PE in 2008, OSA, HTN, HLD, DM, A fib. Subjective: pt alert, pleasant Assessment / Plan / Recommendation CHL IP CLINICAL IMPRESSIONS 07/14/2015 Therapy Diagnosis Mild pharyngeal phase dysphagia;Moderate pharyngeal phase dysphagia Clinical Impression Pt shows improvements in his mild-moderate oropharyngeal dysphagia since previous MBS. Oral phase overall is strong and effective, although he does have piecemeal swallowing particularly with liquids. He continues to have a brief delay in swallow trigger but now with most boluses being contained within the valleculae prior to initiation. The exception is when he consumes thin liquids via straw, as they reach the pyriform sinuses and are subsequently aspirated before the swallow. Cued coughing is productive of secretions mixed with barium, although it is unclear if all aspirates were expelled. A mild-moderate amount of residue still remains with solid textures. Recommend to continue Dys 2 diet but with advancement to thin liquids by cup. Anticipate further improvements as overall strength continues to progress. Impact on safety and function Mild aspiration risk   CHL IP TREATMENT RECOMMENDATION 07/14/2015 Treatment Recommendations Therapy as outlined in treatment plan below   Prognosis 07/14/2015 Prognosis for Safe Diet Advancement Good Barriers to Reach Goals -- Barriers/Prognosis Comment -- CHL IP DIET RECOMMENDATION 07/14/2015 SLP Diet Recommendations Dysphagia 2 (Fine chop) solids;Thin liquid Liquid Administration via Cup;No straw Medication Administration Whole meds with puree Compensations Slow rate;Small sips/bites;Multiple dry swallows after each bite/sip Postural Changes Remain semi-upright after after feeds/meals (Comment);Seated upright at 90 degrees   CHL IP OTHER RECOMMENDATIONS 07/14/2015 Recommended Consults -- Oral Care Recommendations Oral care BID Other Recommendations Order thickener from pharmacy;Prohibited food (jello, ice cream, thin soups);Remove water pitcher    CHL IP FOLLOW UP RECOMMENDATIONS 07/14/2015 Follow up Recommendations LTACH   CHL IP FREQUENCY AND DURATION 07/14/2015 Speech Therapy Frequency (ACUTE ONLY)  min 2x/week Treatment Duration 2 weeks      CHL IP ORAL PHASE 07/14/2015 Oral Phase Impaired Oral - Pudding Teaspoon -- Oral - Pudding Cup -- Oral - Honey Teaspoon -- Oral - Honey Cup -- Oral - Nectar Teaspoon NT Oral - Nectar Cup NT Oral - Nectar Straw NT Oral - Thin Teaspoon NT Oral - Thin Cup Piecemeal swallowing Oral - Thin Straw Piecemeal swallowing Oral - Puree WFL Oral - Mech Soft WFL Oral - Regular -- Oral - Multi-Consistency -- Oral - Pill -- Oral Phase - Comment --  CHL IP PHARYNGEAL PHASE 07/14/2015 Pharyngeal Phase Impaired Pharyngeal- Pudding Teaspoon -- Pharyngeal -- Pharyngeal- Pudding Cup -- Pharyngeal -- Pharyngeal- Honey Teaspoon -- Pharyngeal -- Pharyngeal- Honey Cup -- Pharyngeal -- Pharyngeal- Nectar Teaspoon NT Pharyngeal -- Pharyngeal- Nectar Cup NT Pharyngeal -- Pharyngeal- Nectar Straw NT Pharyngeal -- Pharyngeal- Thin Teaspoon NT Pharyngeal -- Pharyngeal- Thin Cup Delayed swallow initiation-vallecula;Reduced tongue base retraction Pharyngeal Material does not enter airway Pharyngeal- Thin Straw Delayed swallow initiation-pyriform sinuses;Penetration/Aspiration before swallow;Reduced tongue base retraction Pharyngeal Material enters airway, passes BELOW cords without attempt by patient to eject out (silent aspiration) Pharyngeal- Puree Delayed swallow initiation-vallecula;Reduced tongue base retraction;Pharyngeal residue - valleculae Pharyngeal -- Pharyngeal- Mechanical Soft Delayed swallow initiation-vallecula;Reduced tongue base retraction;Pharyngeal residue - valleculae Pharyngeal -- Pharyngeal- Regular -- Pharyngeal -- Pharyngeal- Multi-consistency -- Pharyngeal -- Pharyngeal- Pill -- Pharyngeal -- Pharyngeal Comment --  CHL IP CERVICAL ESOPHAGEAL PHASE 07/14/2015 Cervical Esophageal Phase WFL Pudding Teaspoon -- Pudding Cup -- Honey  Teaspoon -- Honey Cup -- Nectar Teaspoon -- Nectar Cup -- Nectar Straw -- Thin Teaspoon -- Thin Cup -- Thin Straw -- Puree -- Mechanical Soft -- Regular -- Multi-consistency -- Pill -- Cervical Esophageal Comment -- No flowsheet data found. Germain Osgood, M.A. CCC-SLP [phone] Germain Osgood 07/14/2015, 2:47 PM              Dg Swallowing Func-speech Pathology  07/05/2015  Objective Swallowing Evaluation: Type of Study: MBS-Modified Barium Swallow Study Patient Details Name: KALYAN SCAVETTA MRN: [id number] Date of Birth: Dec 06, 1940 Today's Date: 07/05/2015 Time: SLP Start Time (ACUTE ONLY): 1051-SLP Stop Time (ACUTE ONLY): 1114 SLP Time Calculation (min) (ACUTE ONLY): 23 min Past Medical History: Past Medical History Diagnosis Date . Obesity  . Phlebitis    Lower extermity . Pulmonary emboli ([place]) 2008   submassive, saddle . Prostate cancer ([place]) 07/2009 . Sleep apnea    on CPAP . Hx of echocardiogram 12/04/2010   Normal EF >55% no significant valve disease . History of stress test 06/27/2009   Low risk and EF of approximately 50% . DVT (deep venous thrombosis) ([place])  . Chronic kidney disease, stage 3    baseline creatinine ~1.4 . HLD (hyperlipidemia)  . HTN (hypertension)  . Dysrhythmia    A fib . Diabetes mellitus ([place])    diet controlled . History of hiatal hernia  Past Surgical History: Past Surgical History Procedure Laterality Date . Nephrectomy  1999   for CA . Cholecystectomy  1999 . Transurethral resection of bladder tumor N/A 03/04/2013   Procedure: CYSTOSCOPY WITH RIGHT RETROGRADE PYELOGRAM AND BLADDER BIOPSY /CLOT EVACUATION/ BIOPSY PROSTATIC URETHRA WITH FULGERATION ;  Surgeon: Molli Hazard, MD;  Location: [facility];  Service: Urology;  Laterality: N/A; . Cystoscopy w/ retrogrades N/A 04/08/2013   Procedure: CYSTOSCOPY WITH CLOT EVACUATION;  Surgeon: Molli Hazard, MD;  Location: [facility];  Service: Urology;  Laterality: N/A; . Left shoulder repair   . Shoulder open rotator cuff  repair  Right 05/03/2015   Procedure: RIGHT SHOULDER ROTATOR CUFF REPAIR OPEN WITH GRAFT AND ANCHOR ;  Surgeon: Latanya Maudlin, MD;  Location: [facility];  Service: Orthopedics;  Laterality: Right; HPI: 75 yo male with hemorrhagic shock leading to cardiac arrest, VDRF, AKI from Rt perinephric hematoma. He was intubated 5/03-5/13, 5/16-5/27. He has hx of Lt renal cell carcinoma s/p Lt nephrectomy, prostate cancer s/p XRT, PE in 2008, OSA, HTN, HLD, DM, A fib. Subjective: pt alert, pleasant Assessment / Plan / Recommendation CHL IP CLINICAL IMPRESSIONS 07/05/2015 Therapy Diagnosis Mild oral phase dysphagia;Moderate pharyngeal phase dysphagia Clinical Impression Pt has a mild-moderate oropharyngeal dysphagia with prolonged intubation and generalized deconditioning resulting in sensorimotor deficits. Oral phase is mildly prolonged with reduced bolus cohesion but ultimately with adequate oral clearance. Thin liquids reach the pyriform sinuses prior to swallow trigger and are subsequently aspirated before the swallow. This does elicit a reflexive cough, but it is not sufficient to expel aspirates. Min cues for smaller boluses of thin liquids results in silent penetration, which does clear with cued throat clearing before it can fall beneath the glottis. He has good airway protection with nectar thick liquids and solids, despite larger quantities. Reduced hyolaryngeal movement, epiglottic inversion, and base of tongue retraction result in moderate pharyngeal residue, but this is reduced with Min cues for second swallows. Given the above, would recommend more conservative diet of Dys 2 textures and nectar thick liquids. Good prognosis for diet advancement with increased time post-extubation and improved overall strength. Impact on safety and function Mild aspiration risk   CHL IP TREATMENT RECOMMENDATION 07/05/2015 Treatment Recommendations Therapy as outlined in treatment plan below   Prognosis 07/05/2015 Prognosis for Safe Diet  Advancement Good Barriers to Reach Goals -- Barriers/Prognosis Comment -- CHL IP DIET RECOMMENDATION 07/05/2015 SLP Diet Recommendations Dysphagia 2 (Fine chop) solids;Nectar thick liquid Liquid Administration via Cup;Straw Medication Administration Crushed with puree Compensations Minimize environmental distractions;Slow rate;Small sips/bites;Multiple dry swallows after each bite/sip Postural Changes Remain semi-upright after after feeds/meals (Comment);Seated upright at 90 degrees   CHL IP OTHER RECOMMENDATIONS 07/05/2015 Recommended Consults -- Oral Care Recommendations Oral care BID Other Recommendations Order thickener from pharmacy;Prohibited food (jello, ice cream, thin soups);Remove water pitcher   CHL IP FOLLOW UP RECOMMENDATIONS 07/05/2015 Follow up Recommendations LTACH   CHL IP FREQUENCY AND DURATION 07/05/2015 Speech Therapy Frequency (ACUTE ONLY) min 2x/week Treatment Duration 2 weeks      CHL IP ORAL PHASE 07/05/2015 Oral Phase Impaired Oral - Pudding Teaspoon -- Oral - Pudding Cup -- Oral - Honey Teaspoon -- Oral - Honey Cup -- Oral - Nectar Teaspoon Delayed oral transit;Decreased bolus cohesion Oral - Nectar Cup Delayed oral transit;Decreased bolus cohesion Oral - Nectar Straw Delayed oral transit;Decreased bolus cohesion Oral - Thin Teaspoon Delayed oral transit;Decreased bolus cohesion Oral - Thin Cup Delayed oral transit;Decreased bolus cohesion Oral - Thin Straw -- Oral - Puree Delayed oral transit;Decreased bolus cohesion Oral - Mech Soft Delayed oral transit;Decreased bolus cohesion;Impaired mastication Oral - Regular -- Oral - Multi-Consistency -- Oral - Pill -- Oral Phase - Comment --  CHL IP PHARYNGEAL PHASE 07/05/2015 Pharyngeal Phase Impaired Pharyngeal- Pudding Teaspoon -- Pharyngeal -- Pharyngeal- Pudding Cup -- Pharyngeal -- Pharyngeal- Honey Teaspoon -- Pharyngeal -- Pharyngeal- Honey Cup -- Pharyngeal -- Pharyngeal- Nectar Teaspoon Delayed swallow initiation-vallecula;Reduced epiglottic  inversion;Reduced anterior laryngeal mobility;Reduced laryngeal elevation;Reduced tongue base retraction;Pharyngeal residue - valleculae Pharyngeal -- Pharyngeal- Nectar Cup Delayed swallow initiation-vallecula;Reduced epiglottic inversion;Reduced anterior laryngeal mobility;Reduced laryngeal elevation;Reduced tongue base retraction;Pharyngeal  residue - valleculae Pharyngeal -- Pharyngeal- Nectar Straw Delayed swallow initiation-vallecula;Reduced epiglottic inversion;Reduced anterior laryngeal mobility;Reduced laryngeal elevation;Reduced tongue base retraction;Pharyngeal residue - valleculae Pharyngeal -- Pharyngeal- Thin Teaspoon Delayed swallow initiation-vallecula;Reduced epiglottic inversion;Reduced anterior laryngeal mobility;Reduced laryngeal elevation;Reduced tongue base retraction;Pharyngeal residue - valleculae Pharyngeal -- Pharyngeal- Thin Cup Reduced epiglottic inversion;Reduced anterior laryngeal mobility;Reduced laryngeal elevation;Reduced tongue base retraction;Pharyngeal residue - valleculae;Delayed swallow initiation-pyriform sinuses;Penetration/Aspiration before swallow Pharyngeal Material enters airway, passes BELOW cords and not ejected out despite cough attempt by patient Pharyngeal- Thin Straw -- Pharyngeal -- Pharyngeal- Puree Delayed swallow initiation-vallecula;Reduced epiglottic inversion;Reduced anterior laryngeal mobility;Reduced laryngeal elevation;Reduced tongue base retraction;Pharyngeal residue - valleculae Pharyngeal -- Pharyngeal- Mechanical Soft Delayed swallow initiation-vallecula;Reduced epiglottic inversion;Reduced anterior laryngeal mobility;Reduced laryngeal elevation;Reduced tongue base retraction;Pharyngeal residue - valleculae Pharyngeal -- Pharyngeal- Regular -- Pharyngeal -- Pharyngeal- Multi-consistency -- Pharyngeal -- Pharyngeal- Pill -- Pharyngeal -- Pharyngeal Comment --  CHL IP CERVICAL ESOPHAGEAL PHASE 07/05/2015 Cervical Esophageal Phase WFL Pudding Teaspoon --  Pudding Cup -- Honey Teaspoon -- Honey Cup -- Nectar Teaspoon -- Nectar Cup -- Nectar Straw -- Thin Teaspoon -- Thin Cup -- Thin Straw -- Puree -- Mechanical Soft -- Regular -- Multi-consistency -- Pill -- Cervical Esophageal Comment -- No flowsheet data found. Germain Osgood, M.A. CCC-SLP [phone] Germain Osgood 07/05/2015, 12:06 PM               Time Spent in minutes  20   Louellen Molder M.D on 07/24/2015 at 2:19 PM  Between 7am to 7pm - Pager - [phone]  After 7pm go to [email or web address] - password [facility]  [facility] -  Office  [phone]

## 2015-07-24 NOTE — Progress Notes (Signed)
Subjective: HD yest- removed 1000- no new complaints  Objective Vital signs in last 24 hours: Filed Vitals:   07/23/15 1954 07/23/15 2258 07/24/15 0422 07/24/15 0833  BP: 95/64  115/71 108/58  Pulse: 88 86 99 103  Temp: 99 F (37.2 C)  99.3 F (37.4 C) 98.4 F (36.9 C)  TempSrc: Oral  Oral Oral  Resp: 17 18 19 17   Height:      Weight:      SpO2: 99% 96% 100% 97%   Weight change: -3.1 kg (-6 lb 13.3 oz)  Intake/Output Summary (Last 24 hours) at 07/24/15 1009 Last data filed at 07/24/15 [id number]  Gross per 24 hour  Intake    440 ml  Output   1550 ml  Net  -1110 ml    Assessment/ Plan: Pt is a 75 y.o. yo male who was admitted on 05/31/2015 with hematuria/hematoma and BOO of solitary kidney- has been HD dependent since 75 Assessment/Plan: 1. Renal- had solitary kidney at baseline- underwent coil embolization for renal hematoma - has been HD dependent since 75 so is at ESRD - need to make arrangements for OP hemodialysis/CLIP pending- has PC and will get AVF on Monday (INR 2.5 ?) .  Patient understands that this is most likely permanent at this time- running on TTS schedule here via PC-  2. GU- has had indwelling cath for 12 years- still with some UOP- to continue 3. Anemia- hgb 8- on darbe- also iron low- repleting 4. Secondary hyperparathyroidism- PTH was 65- is on low dose calcitriol- no binder- phos is 3.1 5. HTN/volume- BP low- no meds- on midodrine- has pitting edema- BP limits UF  6. Dispo- per pt has been accepted at [facility]- once gets AVF and coumadin back on board we should have OP dialysis unit for him   Jayesh Marbach A    Labs: Basic Metabolic Panel:  Recent Labs Lab 07/21/15 0644 07/22/15 0845 07/23/15 0706  NA 137 136 133*  K 4.3 3.4* 3.2*  CL 100* 100* 98*  CO2 29 26 26   GLUCOSE 73 101* 87  BUN 51* 30* 49*  CREATININE 4.43* 3.19* 4.01*  CALCIUM 8.0* 8.0* 7.8*  PHOS 4.0 3.0 3.1   Liver Function Tests:  Recent Labs Lab  07/21/15 0644 07/22/15 0845 07/23/15 0706  ALBUMIN 1.5* 1.6* 1.4*   No results for input(s): LIPASE, AMYLASE in the last 168 hours. No results for input(s): AMMONIA in the last 168 hours. CBC:  Recent Labs Lab 07/18/15 0413 07/19/15 0344 07/20/15 0520 07/21/15 0644 07/23/15 0705  WBC 5.1 4.4 4.1 4.4 5.1  HGB 8.5* 8.1* 8.3* 8.0* 8.1*  HCT 28.8* 27.0* 28.4* 27.0* 27.6*  MCV 95.4 93.1 93.1 91.5 91.1  PLT 145* 156 155 177 183   Cardiac Enzymes: No results for input(s): CKTOTAL, CKMB, CKMBINDEX, TROPONINI in the last 168 hours. CBG:  Recent Labs Lab 07/22/15 2349 07/23/15 1148 07/23/15 1715 07/23/15 2047 07/24/15 0831  GLUCAP 100* 67 124* 145* 102*    Iron Studies: No results for input(s): IRON, TIBC, TRANSFERRIN, FERRITIN in the last 72 hours. Studies/Results: No results found. Medications: Infusions: . sodium chloride Stopped (07/12/15 1200)    Scheduled Medications: . antiseptic oral rinse  7 mL Mouth Rinse BID  . calcitRIOL  0.25 mcg Oral Q M,W,F  . [START ON 07/25/2015] cefUROXime (ZINACEF)  IV  1.5 g Intravenous To SS-Surg  . darbepoetin (ARANESP) injection - DIALYSIS  200 mcg Intravenous Q Tue-HD  . feeding supplement (PRO-STAT SUGAR FREE 64)  30 mL Oral TID WC  . ferric gluconate (FERRLECIT/NULECIT) IV  125 mg Intravenous Daily  . insulin aspart  0-15 Units Subcutaneous TID WC  . insulin aspart  0-5 Units Subcutaneous QHS  . insulin NPH Human  13 Units Subcutaneous BID AC & HS  . midodrine  10 mg Oral TID AC  . multivitamin  1 tablet Oral QHS  . pantoprazole  40 mg Oral Q1200  . Warfarin - Pharmacist Dosing Inpatient   Does not apply q1800    have reviewed scheduled and prn medications.  Physical Exam: General: NAD- looks debilitated- whispers Heart: RRR Lungs: clear Abdomen: obese, soft, non tender Extremities: min edema Dialysis Access: right sided PC    07/24/2015,10:09 AM  LOS: 54 days

## 2015-07-25 ENCOUNTER — Inpatient Hospital Stay (HOSPITAL_COMMUNITY): Payer: BLUE CROSS/BLUE SHIELD | Admitting: Certified Registered Nurse Anesthetist

## 2015-07-25 ENCOUNTER — Encounter (HOSPITAL_COMMUNITY)
Admission: EM | Disposition: A | Discharge status: Skilled Nursing Facility | Payer: Self-pay | Source: Home / Self Care | Attending: Internal Medicine

## 2015-07-25 DIAGNOSIS — N185 Chronic kidney disease, stage 5: Secondary | ICD-10-CM

## 2015-07-25 HISTORY — PX: AV FISTULA PLACEMENT: SHX1204

## 2015-07-25 LAB — RENAL FUNCTION PANEL
Albumin: 1.5 g/dL — ABNORMAL LOW (ref 3.5–5.0)
Anion gap: 10 (ref 5–15)
BUN: 43 mg/dL — ABNORMAL HIGH (ref 6–20)
CALCIUM: 7.9 mg/dL — AB (ref 8.9–10.3)
CO2: 23 mmol/L (ref 22–32)
CREATININE: 3.92 mg/dL — AB (ref 0.61–1.24)
Chloride: 99 mmol/L — ABNORMAL LOW (ref 101–111)
GFR calc Af Amer: 16 mL/min — ABNORMAL LOW (ref >60)
GFR calc non Af Amer: 14 mL/min — ABNORMAL LOW (ref >60)
GLUCOSE: 76 mg/dL (ref 65–99)
Phosphorus: 3.1 mg/dL (ref 2.5–4.6)
Potassium: 3.9 mmol/L (ref 3.5–5.1)
SODIUM: 132 mmol/L — AB (ref 135–145)

## 2015-07-25 LAB — CBC
HCT: 28.1 % — ABNORMAL LOW (ref 39.0–52.0)
Hemoglobin: 8.2 g/dL — ABNORMAL LOW (ref 13.0–17.0)
MCH: 26.7 pg (ref 26.0–34.0)
MCHC: 29.2 g/dL — AB (ref 30.0–36.0)
MCV: 91.5 fL (ref 78.0–100.0)
PLATELETS: 210 10*3/uL (ref 150–400)
RBC: 3.07 MIL/uL — ABNORMAL LOW (ref 4.22–5.81)
RDW: 18.4 % — AB (ref 11.5–15.5)
WBC: 5 10*3/uL (ref 4.0–10.5)

## 2015-07-25 LAB — CULTURE, BLOOD (ROUTINE X 2)
CULTURE: NO GROWTH
Culture: NO GROWTH

## 2015-07-25 LAB — GLUCOSE, CAPILLARY
GLUCOSE-CAPILLARY: 78 mg/dL (ref 65–99)
GLUCOSE-CAPILLARY: 72 mg/dL (ref 65–99)
GLUCOSE-CAPILLARY: 86 mg/dL (ref 65–99)
GLUCOSE-CAPILLARY: 115 mg/dL — AB (ref 65–99)
Glucose-Capillary: 86 mg/dL (ref 65–99)
Glucose-Capillary: 84 mg/dL (ref 65–99)

## 2015-07-25 LAB — SURGICAL PCR SCREEN
MRSA, PCR: NEGATIVE
Staphylococcus aureus: NEGATIVE

## 2015-07-25 LAB — PROTIME-INR
INR: 2.17 — AB (ref 0.00–1.49)
Prothrombin Time: 24 seconds — ABNORMAL HIGH (ref 11.6–15.2)

## 2015-07-25 SURGERY — ARTERIOVENOUS (AV) FISTULA CREATION
Anesthesia: Regional | Site: Arm Upper | Laterality: Left

## 2015-07-25 MED ORDER — HEPARIN SODIUM (PORCINE) 1000 UNIT/ML IJ SOLN
INTRAMUSCULAR | Status: DC | PRN
  Administered 2015-07-25: 3000 [IU] via INTRAVENOUS

## 2015-07-25 MED ORDER — ALBUMIN HUMAN 5 % IV SOLN
12.5000 g | Freq: Once | INTRAVENOUS | Status: AC
  Administered 2015-07-25: 12.5 g via INTRAVENOUS

## 2015-07-25 MED ORDER — DEXMEDETOMIDINE HCL IN NACL 200 MCG/50ML IV SOLN
INTRAVENOUS | Status: DC | PRN
  Administered 2015-07-25: .4 ug/kg/h via INTRAVENOUS

## 2015-07-25 MED ORDER — SODIUM CHLORIDE 0.9 % IV SOLN: INTRAVENOUS | Status: DC

## 2015-07-25 MED ORDER — ALBUMIN HUMAN 5 % IV SOLN
INTRAVENOUS | Status: AC
  Filled 2015-07-25: qty 250

## 2015-07-25 MED ORDER — PHENYLEPHRINE HCL 10 MG/ML IJ SOLN
10.0000 mg | INTRAVENOUS | Status: DC | PRN
  Administered 2015-07-25: 10 ug/min via INTRAVENOUS

## 2015-07-25 MED ORDER — SODIUM CHLORIDE 0.9 % IV SOLN
INTRAVENOUS | Status: DC | PRN
  Administered 2015-07-25: 12:00:00

## 2015-07-25 MED ORDER — HEPARIN SODIUM (PORCINE) 1000 UNIT/ML IJ SOLN
1600.0000 [IU] | Freq: Once | INTRAMUSCULAR | Status: AC
  Administered 2015-07-25: 1600 [IU] via INTRAVENOUS

## 2015-07-25 MED ORDER — LIDOCAINE HCL (PF) 1 % IJ SOLN
INTRAMUSCULAR | Status: DC | PRN
  Administered 2015-07-25: 5 mL

## 2015-07-25 MED ORDER — SODIUM CHLORIDE 0.9 % IV SOLN
INTRAVENOUS | Status: DC | PRN
  Administered 2015-07-25: 13:00:00 via INTRAVENOUS

## 2015-07-25 MED ORDER — THROMBIN 20000 UNITS EX SOLR
CUTANEOUS | Status: AC
Start: 2015-07-25 — End: 2015-07-25
  Filled 2015-07-25: qty 20000

## 2015-07-25 MED ORDER — PHENOL 1.4 % MT LIQD
1.0000 | OROMUCOSAL | Status: DC | PRN
  Filled 2015-07-25: qty 177

## 2015-07-25 MED ORDER — ONDANSETRON HCL 4 MG/2ML IJ SOLN
INTRAMUSCULAR | Status: DC | PRN
  Administered 2015-07-25: 4 mg via INTRAVENOUS

## 2015-07-25 MED ORDER — 0.9 % SODIUM CHLORIDE (POUR BTL) OPTIME
TOPICAL | Status: DC | PRN
  Administered 2015-07-25: 1000 mL

## 2015-07-25 MED ORDER — LIDOCAINE HCL (PF) 1 % IJ SOLN
INTRAMUSCULAR | Status: AC
  Filled 2015-07-25: qty 30

## 2015-07-25 MED ORDER — FENTANYL CITRATE (PF) 250 MCG/5ML IJ SOLN
INTRAMUSCULAR | Status: AC
  Filled 2015-07-25: qty 5

## 2015-07-25 MED ORDER — HEPARIN SODIUM (PORCINE) 1000 UNIT/ML IJ SOLN
INTRAMUSCULAR | Status: AC
  Filled 2015-07-25: qty 3

## 2015-07-25 MED ORDER — MENTHOL 3 MG MT LOZG
1.0000 | LOZENGE | OROMUCOSAL | Status: DC | PRN
  Filled 2015-07-25: qty 9

## 2015-07-25 MED ORDER — DEXMEDETOMIDINE HCL IN NACL 200 MCG/50ML IV SOLN
INTRAVENOUS | Status: AC
  Filled 2015-07-25: qty 50

## 2015-07-25 MED ORDER — PROPOFOL 10 MG/ML IV BOLUS
INTRAVENOUS | Status: AC
  Filled 2015-07-25: qty 20

## 2015-07-25 MED ORDER — MIDAZOLAM HCL 2 MG/2ML IJ SOLN
INTRAMUSCULAR | Status: AC
  Filled 2015-07-25: qty 2

## 2015-07-25 MED ORDER — PHENYLEPHRINE HCL 10 MG/ML IJ SOLN
INTRAMUSCULAR | Status: DC | PRN
  Administered 2015-07-25 (×3): 40 ug via INTRAVENOUS

## 2015-07-25 SURGICAL SUPPLY — 30 items
ARMBAND PINK RESTRICT EXTREMIT (MISCELLANEOUS) ×2 IMPLANT
CANISTER SUCTION 2500CC (MISCELLANEOUS) ×2 IMPLANT
CANNULA VESSEL 3MM 2 BLNT TIP (CANNULA) ×2 IMPLANT
CLIP TI MEDIUM 6 (CLIP) ×2 IMPLANT
CLIP TI WIDE RED SMALL 6 (CLIP) ×2 IMPLANT
COVER PROBE W GEL 5X96 (DRAPES) IMPLANT
DECANTER SPIKE VIAL GLASS SM (MISCELLANEOUS) ×2 IMPLANT
DRAIN PENROSE 1/4X12 LTX STRL (WOUND CARE) ×2 IMPLANT
ELECT REM PT RETURN 9FT ADLT (ELECTROSURGICAL) ×2
ELECTRODE REM PT RTRN 9FT ADLT (ELECTROSURGICAL) ×1 IMPLANT
GLOVE BIO SURGEON STRL SZ7.5 (GLOVE) ×2 IMPLANT
GLOVE BIOGEL PI IND STRL 7.0 (GLOVE) IMPLANT
GLOVE BIOGEL PI INDICATOR 7.0 (GLOVE) ×1
GLOVE ECLIPSE 6.5 STRL STRAW (GLOVE) ×1 IMPLANT
GOWN STRL REUS W/ TWL LRG LVL3 (GOWN DISPOSABLE) ×3 IMPLANT
GOWN STRL REUS W/TWL LRG LVL3 (GOWN DISPOSABLE) ×6
KIT BASIN OR (CUSTOM PROCEDURE TRAY) ×2 IMPLANT
KIT ROOM TURNOVER OR (KITS) ×2 IMPLANT
LIQUID BAND (GAUZE/BANDAGES/DRESSINGS) ×2 IMPLANT
LOOP VESSEL MINI RED (MISCELLANEOUS) IMPLANT
NS IRRIG 1000ML POUR BTL (IV SOLUTION) ×2 IMPLANT
PACK CV ACCESS (CUSTOM PROCEDURE TRAY) ×2 IMPLANT
PAD ARMBOARD 7.5X6 YLW CONV (MISCELLANEOUS) ×4 IMPLANT
SPONGE SURGIFOAM ABS GEL 100 (HEMOSTASIS) IMPLANT
SUT PROLENE 7 0 BV 1 (SUTURE) ×4 IMPLANT
SUT VIC AB 3-0 SH 27 (SUTURE) ×2
SUT VIC AB 3-0 SH 27X BRD (SUTURE) ×1 IMPLANT
SUT VICRYL 4-0 PS2 18IN ABS (SUTURE) ×2 IMPLANT
UNDERPAD 30X30 INCONTINENT (UNDERPADS AND DIAPERS) ×2 IMPLANT
WATER STERILE IRR 1000ML POUR (IV SOLUTION) ×2 IMPLANT

## 2015-07-25 NOTE — H&P (View-Only) (Signed)
[facility] Progress Note  Subjective  - Feels better, but still weak.   Objective Filed Vitals:   07/11/15 0417 07/11/15 0420  BP: 105/74   Pulse: 72   Temp:  97.4 F (36.3 C)  Resp: 20     Intake/Output Summary (Last 24 hours) at 07/11/15 0827 Last data filed at 07/11/15 0600  Gross per 24 hour  Intake 1708.5 ml  Output    705 ml  Net 1003.5 ml   Alert, appears weak in NAD  Assessment/Planning: AKI/CKD: currently on HD via catheter in need of perm access. Has good cephalic and basilic veins bilaterally for access. Still pretty week overall. Will check back later this week.   Alvia Grove 07/11/2015 8:27 AM -- Agree with above.  Pt still very deconditioned and wants more time before access. Will recheck 6/7  Ruta Hinds, MD [facility] [place] Office: [phone] Pager: [phone]  Laboratory CBC    Component Value Date/Time   WBC 15.1* 07/11/2015 0411   HGB 8.6* 07/11/2015 0411   HCT 29.0* 07/11/2015 0411   PLT 258 07/11/2015 0411    BMET    Component Value Date/Time   NA 134* 07/11/2015 0411   K 4.1 07/11/2015 0411   CL 98* 07/11/2015 0411   CO2 25 07/11/2015 0411   GLUCOSE 110* 07/11/2015 0411   BUN 36* 07/11/2015 0411   CREATININE 3.92* 07/11/2015 0411   CALCIUM 8.1* 07/11/2015 0411   GFRNONAA 14* 07/11/2015 0411   GFRAA 16* 07/11/2015 0411    COAG Lab Results  Component Value Date   INR 1.73* 07/11/2015   INR 1.58* 07/10/2015   INR 1.37 07/09/2015   No results found for: PTT  Antibiotics Anti-infectives    Start     Dose/Rate Route Frequency Ordered Stop   06/22/15 2300  vancomycin (VANCOCIN) IVPB 1000 mg/200 mL premix  Status:  Discontinued     1,000 mg 200 mL/hr over 60 Minutes Intravenous Every 24 hours 06/21/15 2118 06/22/15 0921   06/21/15 2300  cefTAZidime (FORTAZ) 2 g in dextrose 5 % 50 mL IVPB  Status:  Discontinued     2 g 100 mL/hr over 30 Minutes Intravenous Every 12  hours 06/21/15 2118 06/22/15 0908   06/21/15 2200  vancomycin (VANCOCIN) 2,000 mg in sodium chloride 0.9 % 500 mL IVPB     2,000 mg 250 mL/hr over 120 Minutes Intravenous  Once 06/21/15 2118 06/22/15 0005   06/17/15 1200  ceFAZolin (ANCEF) 3 g in dextrose 5 % 50 mL IVPB     3 g 130 mL/hr over 30 Minutes Intravenous To Radiology 06/17/15 1156 06/17/15 1251   06/17/15 0924  ceFAZolin (ANCEF) 1-5 GM-% IVPB    Comments:  Covington, Jamie   : cabinet override      06/17/15 0924 06/17/15 1222   06/17/15 0924  ceFAZolin (ANCEF) 2-4 GM/100ML-% IVPB    Comments:  Soyla Dryer   : cabinet override      06/17/15 0924 06/17/15 1222   06/04/15 1100  cefTRIAXone (ROCEPHIN) 2 g in dextrose 5 % 50 mL IVPB     2 g 100 mL/hr over 30 Minutes Intravenous Every 24 hours 06/03/15 1038 06/15/15 1134   06/03/15 1800  cefTAZidime (FORTAZ) 1 g in dextrose 5 % 50 mL IVPB  Status:  Discontinued     1 g 100 mL/hr over 30 Minutes Intravenous Every 24 hours 06/03/15 0737 06/03/15 0956   06/03/15 1100  cefTRIAXone (ROCEPHIN) 1  g in dextrose 5 % 50 mL IVPB  Status:  Discontinued     1 g 100 mL/hr over 30 Minutes Intravenous Every 24 hours 06/03/15 0956 06/03/15 1038   06/02/15 1800  cefTAZidime (FORTAZ) 2 g in dextrose 5 % 50 mL IVPB  Status:  Discontinued     2 g 100 mL/hr over 30 Minutes Intravenous Every 12 hours 06/02/15 1306 06/03/15 0737   05/31/15 1800  cefTAZidime (FORTAZ) 2 g in dextrose 5 % 50 mL IVPB  Status:  Discontinued     2 g 100 mL/hr over 30 Minutes Intravenous Every 48 hours 05/31/15 1444 06/02/15 1306   05/31/15 1130  vancomycin (VANCOCIN) 2,250 mg in sodium chloride 0.9 % 500 mL IVPB  Status:  Discontinued     2,250 mg 250 mL/hr over 120 Minutes Intravenous  Once 05/31/15 1124 05/31/15 1128   05/31/15 1130  vancomycin (VANCOCIN) 2,500 mg in sodium chloride 0.9 % 500 mL IVPB     2,500 mg 250 mL/hr over 120 Minutes Intravenous  Once 05/31/15 1128 05/31/15 1347   05/31/15 1115   piperacillin-tazobactam (ZOSYN) IVPB 3.375 g     3.375 g 100 mL/hr over 30 Minutes Intravenous  Once 05/31/15 1114 05/31/15 [place], PA-C [facility] Office: [phone] Pager: [phone] 07/11/2015 8:27 AM

## 2015-07-25 NOTE — Progress Notes (Signed)
Wife removed contact lens from right eye. Lens put in a sterile container with saline. The wife has the container with her.

## 2015-07-25 NOTE — Anesthesia Postprocedure Evaluation (Signed)
Anesthesia Post Note  Patient: [name]  Procedure(s) Performed: Procedure(s) (LRB): ARTERIOVENOUS (AV) FISTULA CREATION (Left)  Patient location during evaluation: PACU Anesthesia Type: MAC and Regional Level of consciousness: awake and awake and alert Pain management: pain level controlled Vital Signs Assessment: post-procedure vital signs reviewed and stable Respiratory status: spontaneous breathing, nonlabored ventilation and respiratory function stable Cardiovascular status: blood pressure returned to baseline Anesthetic complications: no    Last Vitals:  Filed Vitals:   07/25/15 1615 07/25/15 1630  BP: 91/62 100/61  Pulse: 81 77  Temp:  36.6 C  Resp: 23 24    Last Pain:  Filed Vitals:   07/25/15 1634  PainSc: Asleep                 [name]

## 2015-07-25 NOTE — OR Nursing (Addendum)
On arrival to PACU, unable to palpate thrill, easily doppled superior to incision. Dr. Oneida Alar informed and at bs to assess: fistula assessment deemed acceptable.  Dr. Oneida Alar also notes that SBP to be monitored here in PACU until rising closer to baseline, Renal to consulted if pressures continue to be soft.

## 2015-07-25 NOTE — Progress Notes (Signed)
  Assessment/ Plan: Pt is a 75 y.o. yo male who was admitted on 05/31/2015 with hematuria/hematoma of solitary kidney- has been HD dependent since 4/26 Assessment/Plan: 1.New ESRD- had solitary kidney at baseline- underwent coil embolization for renal hematoma - has been HD dependent since 4/26 so is at ESRD - need to make arrangements for OP hemodialysis/CLIP pending- has PC and will get AVF on Monday (INR 2.5 ?) . Patient understands that this is most likely permanent at this time- running on TTS schedule here via PC-  2. GU- has had indwelling cath for 12 years- still with some UOP- to continue 3. Anemia- hgb 8- on darbe- also iron low- repleting 4. Secondary hyperparathyroidism- PTH was 65- is on low dose calcitriol- no binder- phos is 3.1 5. HTN/volume- BP low- no meds- on midodrine- has pitting edema- BP limits UF  6. Dispo- per pt has been accepted at [facility]- once gets AVF and coumadin back on board we should have OP dialysis unit for him   Subjective: Interval History: Weak  Objective: Vital signs in last 24 hours: Temp:  [97.9 F (36.6 C)-98.4 F (36.9 C)] 98 F (36.7 C) (06/19 0541) Pulse Rate:  [66-103] 72 (06/19 0541) Resp:  [17-19] 19 (06/19 0541) BP: (74-120)/(47-66) 108/65 mmHg (06/19 0541) SpO2:  [93 %-99 %] 93 % (06/19 0541) Weight:  [109.589 kg (241 lb 9.6 oz)] 109.589 kg (241 lb 9.6 oz) (06/18 1059) Weight change: 0.689 kg (1 lb 8.3 oz)  Intake/Output from previous day: 06/18 0701 - 06/19 0700 In: 560 [P.O.:560] Out: 375 [Urine:375] Intake/Output this shift:    General appearance: alert and weak Resp: clear to auscultation bilaterally Chest wall: no tenderness, TDC Cardio: regular rate and rhythm, S1, S2 normal, no murmur, click, rub or gallop Extremities: extremities normal, atraumatic, no cyanosis or edema  Foley  Lab Results:  Recent Labs  07/23/15 0705 07/25/15 0511  WBC 5.1 5.0  HGB 8.1* 8.2*  HCT 27.6* 28.1*  PLT 183 210    BMET:  Recent Labs  07/23/15 0706 07/25/15 0511  NA 133* 132*  K 3.2* 3.9  CL 98* 99*  CO2 26 23  GLUCOSE 87 76  BUN 49* 43*  CREATININE 4.01* 3.92*  CALCIUM 7.8* 7.9*   No results for input(s): PTH in the last 72 hours. Iron Studies: No results for input(s): IRON, TIBC, TRANSFERRIN, FERRITIN in the last 72 hours. Studies/Results: No results found.  Scheduled: . antiseptic oral rinse  7 mL Mouth Rinse BID  . calcitRIOL  0.25 mcg Oral Q M,W,F  . cefUROXime (ZINACEF)  IV  1.5 g Intravenous To SS-Surg  . darbepoetin (ARANESP) injection - DIALYSIS  200 mcg Intravenous Q Tue-HD  . feeding supplement (PRO-STAT SUGAR FREE 64)  30 mL Oral TID WC  . ferric gluconate (FERRLECIT/NULECIT) IV  125 mg Intravenous Daily  . insulin aspart  0-15 Units Subcutaneous TID WC  . insulin aspart  0-5 Units Subcutaneous QHS  . insulin NPH Human  13 Units Subcutaneous BID AC & HS  . LORazepam  1 mg Oral QHS  . midodrine  10 mg Oral TID AC  . multivitamin  1 tablet Oral QHS  . pantoprazole  40 mg Oral Q1200  . Warfarin - Pharmacist Dosing Inpatient   Does not apply q1800      LOS: 55 days   German Manke C 07/25/2015,7:09 AM

## 2015-07-25 NOTE — OR Nursing (Signed)
Dr. Linna Caprice at bs, orders recvd for BP.

## 2015-07-25 NOTE — OR Nursing (Signed)
Dr. Florene Glen informed of SBP; SBP acceptable following surgery, no new orders.

## 2015-07-25 NOTE — Transfer of Care (Signed)
Immediate Anesthesia Transfer of Care Note  Patient: [name]  Procedure(s) Performed: Procedure(s) with comments: ARTERIOVENOUS (AV) FISTULA CREATION (Left) - Axillary block with minimal sedation and supplemental local at operative site.  Patient Location: PACU  Anesthesia Type:Regional  Level of Consciousness: awake, alert  and oriented  Airway & Oxygen Therapy: Patient Spontanous Breathing  Post-op Assessment: Report given to RN and Post -op Vital signs reviewed and stable  Post vital signs: Reviewed and stable  Last Vitals:  Filed Vitals:   07/25/15 0900 07/25/15 1505  BP: 108/63 89/53  Pulse: 97   Temp: 36.9 C 37.3 C  Resp: 18 22    Last Pain:  Filed Vitals:   07/25/15 1510  PainSc: 5       Patients Stated Pain Goal: 2 ([id number])  Complications: No apparent anesthesia complications

## 2015-07-25 NOTE — Progress Notes (Signed)
Ok to resume heparin at 8 pm tonight if no bleeding  Ruta Hinds, MD [facility] [place]: [phone] Pager: [phone]

## 2015-07-25 NOTE — Progress Notes (Signed)
PROGRESS NOTE                                                                                                                                                                                                             Patient Demographics:    [name], is a 75 y.o. male, DOB - Jul 28, 1940, [id number]  Admit date - 05/31/2015   Admitting Physician Rigoberto Noel, MD  Outpatient Primary MD for the patient is ROSS,ALAN  LOS - 51  Outpatient Specialists:   Chief Complaint  Patient presents with  . Hypotension       Brief Narrative   74 year old male with history of A. fib, DVT and PE on Coumadin, type 2 diabetes mellitus, renal cell carcinoma status post left nephrectomy, dysrhythmia, hyperlipidemia, prostatic CA status post radiation 2011 who was seen in the ED on 4/22 4:00 catheter which was irrigated and replaced. He had hematuria for quite some time which was attributed to scarring from prior radiation. However when he had his INR checked on 4/25 it was 8 and patient was found to be hypotensive with blood pressure 70/44 mmHg and sent to the ED. In the ED patient was started to have UTI and despite open 5 L IV bolus he was hypotensive along with sodium of 124, K of 5, lactate of 3 and serum creatinine of 9.36. Patient admitted to ICU for septic shock secondary to UTI and acute kidney injury.  Significant Events: 4/25 admitted by [facility] w/ septic shock due to UTI + ARF  4/26 Renal u/s > s/p Lt nephrectomy 4/27 CT abd/pelvis > Rt perinephric hematoma 4/30 [facility] assumed care  5/02 CT abd/pelvis > increased size of hematoma 5/03 Cardiac arrest w/ severe anemia > coil embolization R kidney by IR; PTX post-CPR 5/08 TTE EF 65-70% 5/16 Reintubated due to airway secretions 5/18 Doppler legs b/l > Acute DVT Rt gastrocnemius vein, age indeterminate DVT Rt femoral vein and popliteal vein > heparin gtt 5/23 TTE Moderate LVH - EF  65-70%. No wall motion abnormalities. LA mildly dilated. No AS or AR.  5/25 LLE Arterial Duplex: No evidence of stenosis or occluded arteries. 5/27- extubated 5/28 off cvvhd 6/4 CorTrak tube removed 6/19 AV fistula   Subjective:    No overnight issues.    Assessment  & Plan :   Fever on 6/13-14  Likely acute viral illness. Urine culture growing gram-negative rods but could be colonization. No further fever. Blood cultures negative. No antibiotics given.  Acute on chronic kidney disease, now ESRD  Has a tunneled catheter. Plan on permanent access next week. Hemodialysis onTu/Th/Sat schedule  Nephrology following.scheduled for AV fistula today.  He is now long term HD  per renal. Awaiting  to be CLIPed  Septic/hemorrhagic shock with acute blood loss anemia Secondary to right perinephric hematoma. Hemoglobin remained stable. INR therapeutic.  Septic shock due to Klebsiella pyelonephritis and bacteremia. Completed 14 day antibiotic course on 5/10.  Right gastrocnemius vein acute DVT, right femoral and popliteal vein DVTs On Coumadin. Dosing per pharmacy.Monitor H&H closely.  A. fib rate controlled.resume coumadin today ( after fistula placed)  Chronic diastolic CHF.  Volume management with dialysis. Euvolemic  Cardiac arrest on 5/3, followed by pneumothorax Suspected to be due to severe anemia.  OSA on CPAP   Chronic hematuria Scheduled foley flushes  Ischemic changes of left foot Arterial duplex without notable findings. Has palpable distal pulses.    Code Status :Full code  Family Communication  : Wife at bedside  Disposition Plan  :  SNF  Barriers For Discharge : pending to be CLIPED  Consults  :   Nephrology [facility] Urology Vascular surgery   Procedures  :  See hospital course  DVT Prophylaxis  :  Coumadin  Lab Results  Component Value Date   PLT 210 07/25/2015    Antibiotics  :   IV Rocephin 4/28-5/10  Anti-infectives     Start     Dose/Rate Route Frequency Ordered Stop   07/25/15 0600  cefUROXime (ZINACEF) 1.5 g in dextrose 5 % 50 mL IVPB     1.5 g 100 mL/hr over 30 Minutes Intravenous To ShortStay Surgical 07/23/15 0939 07/25/15 1345   06/22/15 2300  vancomycin (VANCOCIN) IVPB 1000 mg/200 mL premix  Status:  Discontinued     1,000 mg 200 mL/hr over 60 Minutes Intravenous Every 24 hours 06/21/15 2118 06/22/15 0921   06/21/15 2300  cefTAZidime (FORTAZ) 2 g in dextrose 5 % 50 mL IVPB  Status:  Discontinued     2 g 100 mL/hr over 30 Minutes Intravenous Every 12 hours 06/21/15 2118 06/22/15 0908   06/21/15 2200  vancomycin (VANCOCIN) 2,000 mg in sodium chloride 0.9 % 500 mL IVPB     2,000 mg 250 mL/hr over 120 Minutes Intravenous  Once 06/21/15 2118 06/22/15 0005   06/17/15 1200  ceFAZolin (ANCEF) 3 g in dextrose 5 % 50 mL IVPB     3 g 130 mL/hr over 30 Minutes Intravenous To Radiology 06/17/15 1156 06/17/15 1251   06/17/15 0924  ceFAZolin (ANCEF) 1-5 GM-% IVPB    Comments:  Covington, Jamie   : cabinet override      06/17/15 0924 06/17/15 1222   06/17/15 0924  ceFAZolin (ANCEF) 2-4 GM/100ML-% IVPB    Comments:  Soyla Dryer   : cabinet override      06/17/15 0924 06/17/15 1222   06/04/15 1100  cefTRIAXone (ROCEPHIN) 2 g in dextrose 5 % 50 mL IVPB     2 g 100 mL/hr over 30 Minutes Intravenous Every 24 hours 06/03/15 1038 06/15/15 1134   06/03/15 1800  cefTAZidime (FORTAZ) 1 g in dextrose 5 % 50 mL IVPB  Status:  Discontinued     1 g 100 mL/hr over 30 Minutes Intravenous Every 24 hours 06/03/15 0737 06/03/15 0956   06/03/15 1100  cefTRIAXone (  ROCEPHIN) 1 g in dextrose 5 % 50 mL IVPB  Status:  Discontinued     1 g 100 mL/hr over 30 Minutes Intravenous Every 24 hours 06/03/15 0956 06/03/15 1038   06/02/15 1800  cefTAZidime (FORTAZ) 2 g in dextrose 5 % 50 mL IVPB  Status:  Discontinued     2 g 100 mL/hr over 30 Minutes Intravenous Every 12 hours 06/02/15 1306 06/03/15 0737   05/31/15 1800  cefTAZidime  (FORTAZ) 2 g in dextrose 5 % 50 mL IVPB  Status:  Discontinued     2 g 100 mL/hr over 30 Minutes Intravenous Every 48 hours 05/31/15 1444 06/02/15 1306   05/31/15 1130  vancomycin (VANCOCIN) 2,250 mg in sodium chloride 0.9 % 500 mL IVPB  Status:  Discontinued     2,250 mg 250 mL/hr over 120 Minutes Intravenous  Once 05/31/15 1124 05/31/15 1128   05/31/15 1130  vancomycin (VANCOCIN) 2,500 mg in sodium chloride 0.9 % 500 mL IVPB     2,500 mg 250 mL/hr over 120 Minutes Intravenous  Once 05/31/15 1128 05/31/15 1347   05/31/15 1115  piperacillin-tazobactam (ZOSYN) IVPB 3.375 g     3.375 g 100 mL/hr over 30 Minutes Intravenous  Once 05/31/15 1114 05/31/15 1239        Objective:   Filed Vitals:   07/24/15 1710 07/24/15 2046 07/25/15 0541 07/25/15 0900  BP: 74/47 120/66 108/65 108/63  Pulse: 84 66 72 97  Temp: 97.9 F (36.6 C) 98.1 F (36.7 C) 98 F (36.7 C) 98.5 F (36.9 C)  TempSrc: Oral Oral Oral Oral  Resp: 19 18 19 18   Height:      Weight:      SpO2: 98% 99% 93% 97%    Wt Readings from Last 3 Encounters:  07/24/15 109.589 kg (241 lb 9.6 oz)  05/27/15 120.657 kg (266 lb)  05/10/15 126.735 kg (279 lb 6.4 oz)     Intake/Output Summary (Last 24 hours) at 07/25/15 1402 Last data filed at 07/25/15 0600  Gross per 24 hour  Intake    320 ml  Output    375 ml  Net    -55 ml     Physical Exam  Gen: not in distress HEENT:  moist mucosa, supple neck Chest: clear b/l, no added sounds , HD catheter over left chest CVS:  S1&S2 irregular, no murmurs,  GI: soft, NT, ND, BS+, Chronic foley Musculoskeletal: warm, no edema     Data Review:    CBC  Recent Labs Lab 07/19/15 0344 07/20/15 0520 07/21/15 0644 07/23/15 0705 07/25/15 0511  WBC 4.4 4.1 4.4 5.1 5.0  HGB 8.1* 8.3* 8.0* 8.1* 8.2*  HCT 27.0* 28.4* 27.0* 27.6* 28.1*  PLT 156 155 177 183 210  MCV 93.1 93.1 91.5 91.1 91.5  MCH 27.9 27.2 27.1 26.7 26.7  MCHC 30.0 29.2* 29.6* 29.3* 29.2*  RDW 18.4* 18.5* 18.7*  18.4* 18.4*    Chemistries   Recent Labs Lab 07/20/15 0520 07/21/15 0644 07/22/15 0845 07/23/15 0706 07/25/15 0511  NA 135 137 136 133* 132*  K 3.7 4.3 3.4* 3.2* 3.9  CL 100* 100* 100* 98* 99*  CO2 27 29 26 26 23   GLUCOSE 102* 73 101* 87 76  BUN 32* 51* 30* 49* 43*  CREATININE 3.29* 4.43* 3.19* 4.01* 3.92*  CALCIUM 7.9* 8.0* 8.0* 7.8* 7.9*   ------------------------------------------------------------------------------------------------------------------ No results for input(s): CHOL, HDL, LDLCALC, TRIG, CHOLHDL, LDLDIRECT in the last 72 hours.  Lab Results  Component Value Date  HGBA1C 7.8* 06/07/2015   ------------------------------------------------------------------------------------------------------------------ No results for input(s): TSH, T4TOTAL, T3FREE, THYROIDAB in the last 72 hours.  Invalid input(s): FREET3 ------------------------------------------------------------------------------------------------------------------ No results for input(s): VITAMINB12, FOLATE, FERRITIN, TIBC, IRON, RETICCTPCT in the last 72 hours.  Coagulation profile  Recent Labs Lab 07/21/15 0644 07/22/15 0845 07/23/15 0540 07/24/15 0400 07/25/15 0511  INR 3.87* 2.93* 2.70* 2.52* 2.17*    No results for input(s): DDIMER in the last 72 hours.  Cardiac Enzymes No results for input(s): CKMB, TROPONINI, MYOGLOBIN in the last 168 hours.  Invalid input(s): CK ------------------------------------------------------------------------------------------------------------------ No results found for: BNP  Inpatient Medications  Scheduled Meds: . [MAR Hold] antiseptic oral rinse  7 mL Mouth Rinse BID  . [MAR Hold] calcitRIOL  0.25 mcg Oral Q M,W,F  . [MAR Hold] darbepoetin (ARANESP) injection - DIALYSIS  200 mcg Intravenous Q Tue-HD  . [MAR Hold] feeding supplement (PRO-STAT SUGAR FREE 64)  30 mL Oral TID WC  . ferric gluconate (FERRLECIT/NULECIT) IV  125 mg Intravenous Daily  .  [MAR Hold] insulin aspart  0-15 Units Subcutaneous TID WC  . [MAR Hold] insulin aspart  0-5 Units Subcutaneous QHS  . [MAR Hold] insulin NPH Human  13 Units Subcutaneous BID AC & HS  . [MAR Hold] LORazepam  1 mg Oral QHS  . [MAR Hold] midodrine  10 mg Oral TID AC  . [MAR Hold] multivitamin  1 tablet Oral QHS  . [MAR Hold] pantoprazole  40 mg Oral Q1200  . [MAR Hold] Warfarin - Pharmacist Dosing Inpatient   Does not apply q1800   Continuous Infusions: . sodium chloride Stopped (07/12/15 1200)  . sodium chloride     PRN Meds:.0.9 % irrigation (POUR BTL), [MAR Hold] acetaminophen (TYLENOL) oral liquid 160 mg/5 mL, [MAR Hold] albuterol, [MAR Hold] guaiFENesin, heparin 6000 unit irrigation, [MAR Hold] ondansetron, [MAR Hold] oxyCODONE, [MAR Hold] RESOURCE THICKENUP CLEAR, [MAR Hold] sodium chloride flush  Micro Results Recent Results (from the past 240 hour(s))  Culture, blood (routine x 2)     Status: None (Preliminary result)   Collection Time: 07/20/15 11:29 AM  Result Value Ref Range Status   Specimen Description BLOOD RIGHT ANTECUBITAL  Final   Special Requests BOTTLES DRAWN AEROBIC AND ANAEROBIC  10CC  Final   Culture NO GROWTH 4 DAYS  Final   Report Status PENDING  Incomplete  Culture, blood (routine x 2)     Status: None (Preliminary result)   Collection Time: 07/20/15 11:50 AM  Result Value Ref Range Status   Specimen Description BLOOD RIGHT ANTECUBITAL  Final   Special Requests BOTTLES DRAWN AEROBIC AND ANAEROBIC  5CC  Final   Culture NO GROWTH 4 DAYS  Final   Report Status PENDING  Incomplete  Culture, Urine     Status: Abnormal   Collection Time: 07/20/15  7:17 PM  Result Value Ref Range Status   Specimen Description URINE, CATHETERIZED  Final   Special Requests NONE  Final   Culture (A)  Final    >=100,000 COLONIES/mL KLEBSIELLA PNEUMONIAE Confirmed Extended Spectrum Beta-Lactamase Producer (ESBL)    Report Status 07/24/2015 FINAL  Final   Organism ID, Bacteria  KLEBSIELLA PNEUMONIAE (A)  Final      Susceptibility   Klebsiella pneumoniae - MIC*    AMPICILLIN >=32 RESISTANT Resistant     CEFAZOLIN 8 RESISTANT Resistant     CEFTRIAXONE <=1 RESISTANT Resistant     CIPROFLOXACIN 0.5 SENSITIVE Sensitive     GENTAMICIN 4 SENSITIVE Sensitive     IMIPENEM <=0.25  SENSITIVE Sensitive     NITROFURANTOIN 32 SENSITIVE Sensitive     TRIMETH/SULFA <=20 SENSITIVE Sensitive     AMPICILLIN/SULBACTAM 4 SENSITIVE Sensitive     PIP/TAZO <=4 SENSITIVE Sensitive     * >=100,000 COLONIES/mL KLEBSIELLA PNEUMONIAE  Surgical pcr screen     Status: None   Collection Time: 07/24/15 10:14 PM  Result Value Ref Range Status   MRSA, PCR NEGATIVE NEGATIVE Final   Staphylococcus aureus NEGATIVE NEGATIVE Final    Comment:        The Xpert SA Assay (FDA approved for NASAL specimens in patients over 84 years of age), is one component of a comprehensive surveillance program.  Test performance has been validated by [facility] for patients greater than or equal to 8 year old. It is not intended to diagnose infection nor to guide or monitor treatment.     Radiology Reports Dg Shoulder Right  07/07/2015  CLINICAL DATA:  Right shoulder pain. Recent rotator cuff surgery. No known injury. EXAM: RIGHT SHOULDER - 2+ VIEW COMPARISON:  Single-view of the chest 06/19/2015 and 07/06/2015. FINDINGS: There is lucency in the greater tuberosity. There appears to be a small focus of cortical disruption present. Mild appearing acromioclavicular degenerative change is seen. Dialysis catheter and feeding tube are noted. IMPRESSION: Small cortical defect in the greater tuberosity of the right humeral head and increased lucency in the greater tuberosity could be due to infection or related to the patient's recent surgery. Projection is different than on prior chest x-rays but the findings appear more conspicuous. Electronically Signed   By: Inge Rise M.D.   On: 07/07/2015 17:24   Dg Chest  Port 1 View  07/20/2015  CLINICAL DATA:  Fevers EXAM: PORTABLE CHEST 1 VIEW COMPARISON:  07/06/2015 FINDINGS: Dialysis catheter is again noted and stable. Cardiac shadow remains enlarged. A feeding catheter is been removed in the interval. The lungs are clear. Healing left rib fractures are noted. IMPRESSION: No acute abnormality noted. Electronically Signed   By: Inez Catalina M.D.   On: 07/20/2015 10:23   Dg Chest Port 1 View  07/06/2015  CLINICAL DATA:  Acute renal failure. EXAM: PORTABLE CHEST 1 VIEW COMPARISON:  06/27/2015 . FINDINGS: Interim extubation. Dialysis catheter and left subclavian central line in stable position. Feeding tube in stable position. Stable cardiomegaly. Persistent left base subsegmental atelectasis and or infiltrate with slight interim progression. No pleural effusion or pneumothorax. IMPRESSION: 1. Interim extubation. Right dialysis catheter left subclavian central line stable position. Feeding tube in stable position. 2. Slight interim progression of left base atelectasis and or infiltrate. Electronically Signed   By: Goehner   On: 07/06/2015 07:39   Dg Chest Port 1 View  06/27/2015  CLINICAL DATA:  Respiratory failure.  Endotracheal tube placement EXAM: PORTABLE CHEST 1 VIEW COMPARISON:  06/25/2015 FINDINGS: Endotracheal tube in good position. Feeding tube enters the stomach with the tip not visualized. Left subclavian central venous catheter tip in the SVC. Right jugular central venous catheter tip in the SVC. These are unchanged. No pneumothorax. Mild left lower lobe airspace disease is unchanged. Negative for heart failure or pleural effusion. IMPRESSION: Support lines remain in good position and unchanged Left lower lobe atelectasis/infiltrate unchanged. Electronically Signed   By: Franchot Gallo M.D.   On: 06/27/2015 07:10   Dg Swallowing Func-speech Pathology  07/14/2015  Objective Swallowing Evaluation: Type of Study: MBS-Modified Barium Swallow Study Patient  Details Name: MACALISTER SIRMAN MRN: [id number] Date of Birth: 13-Oct-1940 Today's Date:  07/14/2015 Time: SLP Start Time (ACUTE ONLY): 1341-SLP Stop Time (ACUTE ONLY): 1350 SLP Time Calculation (min) (ACUTE ONLY): 9 min Past Medical History: Past Medical History Diagnosis Date . Obesity  . Phlebitis    Lower extermity . Pulmonary emboli ([place]) 2008   submassive, saddle . Prostate cancer ([place]) 07/2009 . Sleep apnea    on CPAP . Hx of echocardiogram 12/04/2010   Normal EF >55% no significant valve disease . History of stress test 06/27/2009   Low risk and EF of approximately 50% . DVT (deep venous thrombosis) ([place])  . Chronic kidney disease, stage 3    baseline creatinine ~1.4 . HLD (hyperlipidemia)  . HTN (hypertension)  . Dysrhythmia    A fib . Diabetes mellitus ([place])    diet controlled . History of hiatal hernia  Past Surgical History: Past Surgical History Procedure Laterality Date . Nephrectomy  1999   for CA . Cholecystectomy  1999 . Transurethral resection of bladder tumor N/A 03/04/2013   Procedure: CYSTOSCOPY WITH RIGHT RETROGRADE PYELOGRAM AND BLADDER BIOPSY /CLOT EVACUATION/ BIOPSY PROSTATIC URETHRA WITH FULGERATION ;  Surgeon: Molli Hazard, MD;  Location: [facility];  Service: Urology;  Laterality: N/A; . Cystoscopy w/ retrogrades N/A 04/08/2013   Procedure: CYSTOSCOPY WITH CLOT EVACUATION;  Surgeon: Molli Hazard, MD;  Location: [facility];  Service: Urology;  Laterality: N/A; . Left shoulder repair   . Shoulder open rotator cuff repair Right 05/03/2015   Procedure: RIGHT SHOULDER ROTATOR CUFF REPAIR OPEN WITH GRAFT AND ANCHOR ;  Surgeon: Latanya Maudlin, MD;  Location: [facility];  Service: Orthopedics;  Laterality: Right; HPI: 75 yo male with hemorrhagic shock leading to cardiac arrest, VDRF, AKI from Rt perinephric hematoma. He was intubated 5/03-5/13, 5/16-5/27. He has hx of Lt renal cell carcinoma s/p Lt nephrectomy, prostate cancer s/p XRT, PE in 2008, OSA, HTN, HLD, DM, A fib. Subjective: pt alert, pleasant  Assessment / Plan / Recommendation CHL IP CLINICAL IMPRESSIONS 07/14/2015 Therapy Diagnosis Mild pharyngeal phase dysphagia;Moderate pharyngeal phase dysphagia Clinical Impression Pt shows improvements in his mild-moderate oropharyngeal dysphagia since previous MBS. Oral phase overall is strong and effective, although he does have piecemeal swallowing particularly with liquids. He continues to have a brief delay in swallow trigger but now with most boluses being contained within the valleculae prior to initiation. The exception is when he consumes thin liquids via straw, as they reach the pyriform sinuses and are subsequently aspirated before the swallow. Cued coughing is productive of secretions mixed with barium, although it is unclear if all aspirates were expelled. A mild-moderate amount of residue still remains with solid textures. Recommend to continue Dys 2 diet but with advancement to thin liquids by cup. Anticipate further improvements as overall strength continues to progress. Impact on safety and function Mild aspiration risk   CHL IP TREATMENT RECOMMENDATION 07/14/2015 Treatment Recommendations Therapy as outlined in treatment plan below   Prognosis 07/14/2015 Prognosis for Safe Diet Advancement Good Barriers to Reach Goals -- Barriers/Prognosis Comment -- CHL IP DIET RECOMMENDATION 07/14/2015 SLP Diet Recommendations Dysphagia 2 (Fine chop) solids;Thin liquid Liquid Administration via Cup;No straw Medication Administration Whole meds with puree Compensations Slow rate;Small sips/bites;Multiple dry swallows after each bite/sip Postural Changes Remain semi-upright after after feeds/meals (Comment);Seated upright at 90 degrees   CHL IP OTHER RECOMMENDATIONS 07/14/2015 Recommended Consults -- Oral Care Recommendations Oral care BID Other Recommendations Order thickener from pharmacy;Prohibited food (jello, ice cream, thin soups);Remove water pitcher   CHL IP FOLLOW UP RECOMMENDATIONS 07/14/2015 Follow up  Recommendations LTACH   CHL IP FREQUENCY AND DURATION 07/14/2015 Speech Therapy Frequency (ACUTE ONLY) min 2x/week Treatment Duration 2 weeks      CHL IP ORAL PHASE 07/14/2015 Oral Phase Impaired Oral - Pudding Teaspoon -- Oral - Pudding Cup -- Oral - Honey Teaspoon -- Oral - Honey Cup -- Oral - Nectar Teaspoon NT Oral - Nectar Cup NT Oral - Nectar Straw NT Oral - Thin Teaspoon NT Oral - Thin Cup Piecemeal swallowing Oral - Thin Straw Piecemeal swallowing Oral - Puree WFL Oral - Mech Soft WFL Oral - Regular -- Oral - Multi-Consistency -- Oral - Pill -- Oral Phase - Comment --  CHL IP PHARYNGEAL PHASE 07/14/2015 Pharyngeal Phase Impaired Pharyngeal- Pudding Teaspoon -- Pharyngeal -- Pharyngeal- Pudding Cup -- Pharyngeal -- Pharyngeal- Honey Teaspoon -- Pharyngeal -- Pharyngeal- Honey Cup -- Pharyngeal -- Pharyngeal- Nectar Teaspoon NT Pharyngeal -- Pharyngeal- Nectar Cup NT Pharyngeal -- Pharyngeal- Nectar Straw NT Pharyngeal -- Pharyngeal- Thin Teaspoon NT Pharyngeal -- Pharyngeal- Thin Cup Delayed swallow initiation-vallecula;Reduced tongue base retraction Pharyngeal Material does not enter airway Pharyngeal- Thin Straw Delayed swallow initiation-pyriform sinuses;Penetration/Aspiration before swallow;Reduced tongue base retraction Pharyngeal Material enters airway, passes BELOW cords without attempt by patient to eject out (silent aspiration) Pharyngeal- Puree Delayed swallow initiation-vallecula;Reduced tongue base retraction;Pharyngeal residue - valleculae Pharyngeal -- Pharyngeal- Mechanical Soft Delayed swallow initiation-vallecula;Reduced tongue base retraction;Pharyngeal residue - valleculae Pharyngeal -- Pharyngeal- Regular -- Pharyngeal -- Pharyngeal- Multi-consistency -- Pharyngeal -- Pharyngeal- Pill -- Pharyngeal -- Pharyngeal Comment --  CHL IP CERVICAL ESOPHAGEAL PHASE 07/14/2015 Cervical Esophageal Phase WFL Pudding Teaspoon -- Pudding Cup -- Honey Teaspoon -- Honey Cup -- Nectar Teaspoon -- Nectar Cup --  Nectar Straw -- Thin Teaspoon -- Thin Cup -- Thin Straw -- Puree -- Mechanical Soft -- Regular -- Multi-consistency -- Pill -- Cervical Esophageal Comment -- No flowsheet data found. Germain Osgood, M.A. CCC-SLP [phone] Germain Osgood 07/14/2015, 2:47 PM              Dg Swallowing Func-speech Pathology  07/05/2015  Objective Swallowing Evaluation: Type of Study: MBS-Modified Barium Swallow Study Patient Details Name: CREEDE CONIGLIO MRN: [id number] Date of Birth: 1940/07/05 Today's Date: 07/05/2015 Time: SLP Start Time (ACUTE ONLY): 1051-SLP Stop Time (ACUTE ONLY): 1114 SLP Time Calculation (min) (ACUTE ONLY): 23 min Past Medical History: Past Medical History Diagnosis Date . Obesity  . Phlebitis    Lower extermity . Pulmonary emboli ([place]) 2008   submassive, saddle . Prostate cancer ([place]) 07/2009 . Sleep apnea    on CPAP . Hx of echocardiogram 12/04/2010   Normal EF >55% no significant valve disease . History of stress test 06/27/2009   Low risk and EF of approximately 50% . DVT (deep venous thrombosis) ([place])  . Chronic kidney disease, stage 3    baseline creatinine ~1.4 . HLD (hyperlipidemia)  . HTN (hypertension)  . Dysrhythmia    A fib . Diabetes mellitus ([place])    diet controlled . History of hiatal hernia  Past Surgical History: Past Surgical History Procedure Laterality Date . Nephrectomy  1999   for CA . Cholecystectomy  1999 . Transurethral resection of bladder tumor N/A 03/04/2013   Procedure: CYSTOSCOPY WITH RIGHT RETROGRADE PYELOGRAM AND BLADDER BIOPSY /CLOT EVACUATION/ BIOPSY PROSTATIC URETHRA WITH FULGERATION ;  Surgeon: Molli Hazard, MD;  Location: [facility];  Service: Urology;  Laterality: N/A; . Cystoscopy w/ retrogrades N/A 04/08/2013   Procedure: CYSTOSCOPY WITH CLOT EVACUATION;  Surgeon: Molli Hazard, MD;  Location: [facility];  Service:  Urology;  Laterality: N/A; . Left shoulder repair   . Shoulder open rotator cuff repair Right 05/03/2015   Procedure: RIGHT SHOULDER ROTATOR CUFF  REPAIR OPEN WITH GRAFT AND ANCHOR ;  Surgeon: Latanya Maudlin, MD;  Location: [facility];  Service: Orthopedics;  Laterality: Right; HPI: 75 yo male with hemorrhagic shock leading to cardiac arrest, VDRF, AKI from Rt perinephric hematoma. He was intubated 5/03-5/13, 5/16-5/27. He has hx of Lt renal cell carcinoma s/p Lt nephrectomy, prostate cancer s/p XRT, PE in 2008, OSA, HTN, HLD, DM, A fib. Subjective: pt alert, pleasant Assessment / Plan / Recommendation CHL IP CLINICAL IMPRESSIONS 07/05/2015 Therapy Diagnosis Mild oral phase dysphagia;Moderate pharyngeal phase dysphagia Clinical Impression Pt has a mild-moderate oropharyngeal dysphagia with prolonged intubation and generalized deconditioning resulting in sensorimotor deficits. Oral phase is mildly prolonged with reduced bolus cohesion but ultimately with adequate oral clearance. Thin liquids reach the pyriform sinuses prior to swallow trigger and are subsequently aspirated before the swallow. This does elicit a reflexive cough, but it is not sufficient to expel aspirates. Min cues for smaller boluses of thin liquids results in silent penetration, which does clear with cued throat clearing before it can fall beneath the glottis. He has good airway protection with nectar thick liquids and solids, despite larger quantities. Reduced hyolaryngeal movement, epiglottic inversion, and base of tongue retraction result in moderate pharyngeal residue, but this is reduced with Min cues for second swallows. Given the above, would recommend more conservative diet of Dys 2 textures and nectar thick liquids. Good prognosis for diet advancement with increased time post-extubation and improved overall strength. Impact on safety and function Mild aspiration risk   CHL IP TREATMENT RECOMMENDATION 07/05/2015 Treatment Recommendations Therapy as outlined in treatment plan below   Prognosis 07/05/2015 Prognosis for Safe Diet Advancement Good Barriers to Reach Goals -- Barriers/Prognosis  Comment -- CHL IP DIET RECOMMENDATION 07/05/2015 SLP Diet Recommendations Dysphagia 2 (Fine chop) solids;Nectar thick liquid Liquid Administration via Cup;Straw Medication Administration Crushed with puree Compensations Minimize environmental distractions;Slow rate;Small sips/bites;Multiple dry swallows after each bite/sip Postural Changes Remain semi-upright after after feeds/meals (Comment);Seated upright at 90 degrees   CHL IP OTHER RECOMMENDATIONS 07/05/2015 Recommended Consults -- Oral Care Recommendations Oral care BID Other Recommendations Order thickener from pharmacy;Prohibited food (jello, ice cream, thin soups);Remove water pitcher   CHL IP FOLLOW UP RECOMMENDATIONS 07/05/2015 Follow up Recommendations LTACH   CHL IP FREQUENCY AND DURATION 07/05/2015 Speech Therapy Frequency (ACUTE ONLY) min 2x/week Treatment Duration 2 weeks      CHL IP ORAL PHASE 07/05/2015 Oral Phase Impaired Oral - Pudding Teaspoon -- Oral - Pudding Cup -- Oral - Honey Teaspoon -- Oral - Honey Cup -- Oral - Nectar Teaspoon Delayed oral transit;Decreased bolus cohesion Oral - Nectar Cup Delayed oral transit;Decreased bolus cohesion Oral - Nectar Straw Delayed oral transit;Decreased bolus cohesion Oral - Thin Teaspoon Delayed oral transit;Decreased bolus cohesion Oral - Thin Cup Delayed oral transit;Decreased bolus cohesion Oral - Thin Straw -- Oral - Puree Delayed oral transit;Decreased bolus cohesion Oral - Mech Soft Delayed oral transit;Decreased bolus cohesion;Impaired mastication Oral - Regular -- Oral - Multi-Consistency -- Oral - Pill -- Oral Phase - Comment --  CHL IP PHARYNGEAL PHASE 07/05/2015 Pharyngeal Phase Impaired Pharyngeal- Pudding Teaspoon -- Pharyngeal -- Pharyngeal- Pudding Cup -- Pharyngeal -- Pharyngeal- Honey Teaspoon -- Pharyngeal -- Pharyngeal- Honey Cup -- Pharyngeal -- Pharyngeal- Nectar Teaspoon Delayed swallow initiation-vallecula;Reduced epiglottic inversion;Reduced anterior laryngeal mobility;Reduced laryngeal  elevation;Reduced tongue base retraction;Pharyngeal residue - valleculae Pharyngeal -- Pharyngeal-  Nectar Cup Delayed swallow initiation-vallecula;Reduced epiglottic inversion;Reduced anterior laryngeal mobility;Reduced laryngeal elevation;Reduced tongue base retraction;Pharyngeal residue - valleculae Pharyngeal -- Pharyngeal- Nectar Straw Delayed swallow initiation-vallecula;Reduced epiglottic inversion;Reduced anterior laryngeal mobility;Reduced laryngeal elevation;Reduced tongue base retraction;Pharyngeal residue - valleculae Pharyngeal -- Pharyngeal- Thin Teaspoon Delayed swallow initiation-vallecula;Reduced epiglottic inversion;Reduced anterior laryngeal mobility;Reduced laryngeal elevation;Reduced tongue base retraction;Pharyngeal residue - valleculae Pharyngeal -- Pharyngeal- Thin Cup Reduced epiglottic inversion;Reduced anterior laryngeal mobility;Reduced laryngeal elevation;Reduced tongue base retraction;Pharyngeal residue - valleculae;Delayed swallow initiation-pyriform sinuses;Penetration/Aspiration before swallow Pharyngeal Material enters airway, passes BELOW cords and not ejected out despite cough attempt by patient Pharyngeal- Thin Straw -- Pharyngeal -- Pharyngeal- Puree Delayed swallow initiation-vallecula;Reduced epiglottic inversion;Reduced anterior laryngeal mobility;Reduced laryngeal elevation;Reduced tongue base retraction;Pharyngeal residue - valleculae Pharyngeal -- Pharyngeal- Mechanical Soft Delayed swallow initiation-vallecula;Reduced epiglottic inversion;Reduced anterior laryngeal mobility;Reduced laryngeal elevation;Reduced tongue base retraction;Pharyngeal residue - valleculae Pharyngeal -- Pharyngeal- Regular -- Pharyngeal -- Pharyngeal- Multi-consistency -- Pharyngeal -- Pharyngeal- Pill -- Pharyngeal -- Pharyngeal Comment --  CHL IP CERVICAL ESOPHAGEAL PHASE 07/05/2015 Cervical Esophageal Phase WFL Pudding Teaspoon -- Pudding Cup -- Honey Teaspoon -- Honey Cup -- Nectar Teaspoon --  Nectar Cup -- Nectar Straw -- Thin Teaspoon -- Thin Cup -- Thin Straw -- Puree -- Mechanical Soft -- Regular -- Multi-consistency -- Pill -- Cervical Esophageal Comment -- No flowsheet data found. Germain Osgood, M.A. CCC-SLP [phone] Germain Osgood 07/05/2015, 12:06 PM               Time Spent in minutes  20   Louellen Molder M.D on 07/25/2015 at 2:02 PM  Between 7am to 7pm - Pager - [phone]  After 7pm go to [email or web address] - password [facility]  [facility] -  Office  [phone]

## 2015-07-25 NOTE — Op Note (Signed)
Procedure: Left Brachial Cephalic AV fistula  Preop: ESRD  Postop: ESRD  Anesthesia: Axillary block with local  Assistant: Gaye Alken RNFA  Findings: 3.5 mm cephalic vein  Procedure: After obtaining informed consent, the patient was taken to the operating room.  After placement of an axillary block by anesthesia, the left upper extremity was prepped and draped in usual sterile fashion.  Local anesthesia was infiltrated near the antecubital crease.   A transverse incision was then made near the antecubital crease the left arm. The incision was carried into the subcutaneous tissues down to level of the cephalic vein. The cephalic vein was approximately 3.5 mm in diameter. It was of good quality. This was dissected free circumferentially and small side branches ligated and divided between silk ties or clips. Next the brachial artery was dissected free in the medial portion of the incision. The artery was  3-4 mm in diameter. The vessel loops were placed proximal and distal to the planned site of arteriotomy. The patient was given 3000 units of intravenous heparin. After appropriate circulation time, the vessel loops were used to control the artery. A longitudinal opening was made in the brachial artery.  The vein was ligated distally with a 2-0 silk tie. The vein was controlled proximally with a fine bulldog clamp. The vein was then swung over to the artery and sewn end of vein to side of artery using a running 7-0 Prolene suture. Just prior to completion of the anastomosis, everything was fore bled back bled and thoroughly flushed. The anastomosis was secured, vessel loops released, and there was a pulse in the fistula immediately. After hemostasis was obtained, the subcutaneous tissues were reapproximated using a running 3-0 Vicryl suture. The skin was then closed with a 4 0 Vicryl subcuticular stitch. Dermabond was applied to the skin incision.   Raymond Hinds, MD [facility] [place] Office: [phone] Pager: [phone]

## 2015-07-25 NOTE — Interval H&P Note (Signed)
History and Physical Interval Note:  07/25/2015 12:55 PM  [name]  has presented today for surgery, with the diagnosis of End Stage Renal Disease N18.6  The various methods of treatment have been discussed with the patient and family. After consideration of risks, benefits and other options for treatment, the patient has consented to  Procedure(s): ARTERIOVENOUS (AV) FISTULA CREATION (Left) as a surgical intervention .  The patient's history has been reviewed, patient examined, no change in status, stable for surgery.  I have reviewed the patient's chart and labs.  Questions were answered to the patient's satisfaction.     Ruta Hinds

## 2015-07-25 NOTE — Progress Notes (Signed)
ANTICOAGULATION CONSULT NOTE - Follow Up Consult  Pharmacy Consult for  Coumadin Indication: atrial fibrillation and hx PE and DVT, new right leg DVT 06/16/15  No Known Allergies  Patient Measurements: Height: 5' 9.5" (176.5 cm) Weight: 241 lb 9.6 oz (109.589 kg) IBW/kg (Calculated) : 71.85  Labs:  Recent Labs  07/23/15 0540 07/23/15 0705 07/23/15 0706 07/24/15 0400 07/25/15 0511  HGB  --  8.1*  --   --  8.2*  HCT  --  27.6*  --   --  28.1*  PLT  --  183  --   --  210  LABPROT 28.3*  --   --  26.8* 24.0*  INR 2.70*  --   --  2.52* 2.17*  CREATININE  --   --  4.01*  --  3.92*    Estimated Creatinine Clearance: 20.7 mL/min (by C-G formula based on Cr of 3.92).  Assessment:   Coumadin has been on hold for procedure since 6/15, with plan to begin IV heparin when INR less than 2.    INR had trended up to 3.87 on 6/15 and is down to 2.17 today. Hgb 8.2, low stable, platelet count 210.     Now s/p AV fistula.    Discussed briefly with Dr. Oneida Alar.  No heparin needed with INR >2.0.  Resume Coumadin tomorrow.    Home Coumadin regimen: 6 mg MWF, 5 mg TTSS.    Goal of Therapy:  INR 2-3 Monitor platelets by anticoagulation protocol: Yes   Plan:   No heparin today.  Coumadin to resume on 07/26/15.  Daily PT/INR.  Arty Baumgartner, [place] Pager: [phone] 07/25/2015,3:26 PM

## 2015-07-25 NOTE — Anesthesia Preprocedure Evaluation (Signed)
Anesthesia Evaluation  Patient identified by MRN, date of birth, ID band Patient confused    Reviewed: Allergy & Precautions, NPO status , Patient's Chart, lab work & pertinent test results  Airway Mallampati: II  TM Distance: >3 FB Neck ROM: Full    Dental   Pulmonary     + decreased breath sounds      Cardiovascular hypertension,  Rhythm:Regular Rate:Normal     Neuro/Psych    GI/Hepatic   Endo/Other  diabetes  Renal/GU      Musculoskeletal   Abdominal   Peds  Hematology   Anesthesia Other Findings   Reproductive/Obstetrics                             Anesthesia Physical Anesthesia Plan  ASA: III  Anesthesia Plan: MAC and Regional   Post-op Pain Management:    Induction: Intravenous  Airway Management Planned: Natural Airway and Simple Face Mask  Additional Equipment:   Intra-op Plan:   Post-operative Plan:   Informed Consent: I have reviewed the patients History and Physical, chart, labs and discussed the procedure including the risks, benefits and alternatives for the proposed anesthesia with the patient or authorized representative who has indicated his/her understanding and acceptance.     Plan Discussed with: CRNA and Anesthesiologist  Anesthesia Plan Comments:         Anesthesia Quick Evaluation

## 2015-07-25 NOTE — Anesthesia Procedure Notes (Signed)
Anesthesia Regional Block:  Axillary brachial plexus block  Pre-Anesthetic Checklist: ,, timeout performed, Correct Patient, Correct Site, Correct Laterality, Correct Procedure, Correct Position, site marked, Risks and benefits discussed,  Surgical consent,  Pre-op evaluation,  At surgeon's request and post-op pain management  Laterality: Left  Prep: chloraprep       Needles:  Injection technique: Single-shot  Needle Type: Echogenic Stimulator Needle     Needle Length: 9cm 9 cm Needle Gauge: 21 and 21 G    Additional Needles: Axillary brachial plexus block Narrative:  Start time: 07/25/2015 12:40 PM End time: 07/25/2015 12:47 PM Injection made incrementally with aspirations every 5 mL.  Performed by: Personally   Additional Notes: 25 cc 2.0% lidocaine 1:200 Epi injected easily

## 2015-07-26 ENCOUNTER — Encounter (HOSPITAL_COMMUNITY): Payer: Self-pay | Admitting: Vascular Surgery

## 2015-07-26 LAB — RENAL FUNCTION PANEL
Albumin: 1.6 g/dL — ABNORMAL LOW (ref 3.5–5.0)
Anion gap: 9 (ref 5–15)
BUN: 55 mg/dL — AB (ref 6–20)
CALCIUM: 8.1 mg/dL — AB (ref 8.9–10.3)
CHLORIDE: 100 mmol/L — AB (ref 101–111)
CO2: 24 mmol/L (ref 22–32)
Creatinine, Ser: 4.86 mg/dL — ABNORMAL HIGH (ref 0.61–1.24)
GFR calc Af Amer: 12 mL/min — ABNORMAL LOW (ref >60)
GFR calc non Af Amer: 11 mL/min — ABNORMAL LOW (ref >60)
GLUCOSE: 71 mg/dL (ref 65–99)
PHOSPHORUS: 4.2 mg/dL (ref 2.5–4.6)
Potassium: 3.9 mmol/L (ref 3.5–5.1)
SODIUM: 133 mmol/L — AB (ref 135–145)

## 2015-07-26 LAB — CBC
HCT: 27.5 % — ABNORMAL LOW (ref 39.0–52.0)
Hemoglobin: 8 g/dL — ABNORMAL LOW (ref 13.0–17.0)
MCH: 26.8 pg (ref 26.0–34.0)
MCHC: 29.1 g/dL — ABNORMAL LOW (ref 30.0–36.0)
MCV: 92.3 fL (ref 78.0–100.0)
PLATELETS: 207 10*3/uL (ref 150–400)
RBC: 2.98 MIL/uL — ABNORMAL LOW (ref 4.22–5.81)
RDW: 18.3 % — AB (ref 11.5–15.5)
WBC: 5.4 10*3/uL (ref 4.0–10.5)

## 2015-07-26 LAB — GLUCOSE, CAPILLARY
Glucose-Capillary: 134 mg/dL — ABNORMAL HIGH (ref 65–99)
Glucose-Capillary: 133 mg/dL — ABNORMAL HIGH (ref 65–99)

## 2015-07-26 LAB — PROTIME-INR
INR: 2.15 — ABNORMAL HIGH (ref 0.00–1.49)
PROTHROMBIN TIME: 23.8 s — AB (ref 11.6–15.2)

## 2015-07-26 LAB — OCCULT BLOOD X 1 CARD TO LAB, STOOL: FECAL OCCULT BLD: POSITIVE — AB

## 2015-07-26 MED ORDER — BENZOCAINE 10 % MT GEL
Freq: Three times a day (TID) | OROMUCOSAL | Status: DC | PRN
  Filled 2015-07-26: qty 9.4

## 2015-07-26 MED ORDER — NEPRO/CARBSTEADY PO LIQD
237.0000 mL | Freq: Every day | ORAL | Status: DC
  Administered 2015-07-26 – 2015-07-30 (×4): 237 mL via ORAL

## 2015-07-26 MED ORDER — DARBEPOETIN ALFA 200 MCG/0.4ML IJ SOSY
PREFILLED_SYRINGE | INTRAMUSCULAR | Status: AC
  Filled 2015-07-26: qty 0.4

## 2015-07-26 MED ORDER — WARFARIN - PHARMACIST DOSING INPATIENT
Freq: Every day | Status: DC
  Administered 2015-07-26 – 2015-07-30 (×4)

## 2015-07-26 MED ORDER — WARFARIN SODIUM 1 MG PO TABS
1.0000 mg | ORAL_TABLET | Freq: Once | ORAL | Status: AC
  Administered 2015-07-26: 1 mg via ORAL
  Filled 2015-07-26: qty 1

## 2015-07-26 NOTE — Progress Notes (Signed)
ANTICOAGULATION CONSULT NOTE - Follow Up Consult  Pharmacy Consult for  Coumadin Indication: atrial fibrillation and hx PE and DVT, new right leg DVT 06/16/15  No Known Allergies  Patient Measurements: Height: 5' 9.5" (176.5 cm) Weight: 245 lb 13 oz (111.5 kg) IBW/kg (Calculated) : 71.85  Labs:  Recent Labs  07/24/15 0400 07/25/15 0511 07/26/15 0425 07/26/15 0800  HGB  --  8.2*  --  8.0*  HCT  --  28.1*  --  27.5*  PLT  --  210  --  207  LABPROT 26.8* 24.0* 23.8*  --   INR 2.52* 2.17* 2.15*  --   CREATININE  --  3.92*  --  4.86*    Estimated Creatinine Clearance: 16.8 mL/min (by C-G formula based on Cr of 4.86).  Assessment:   Coumadin resuming today for afib, hx PE and DVT, and new DVT 06/16/15.   Coumadin had been on hold for procedure since 6/15, with plan to begin IV heparin when INR less than 2, but INR never dropped below 2 prior to procedure on 6/19. No heparin was needed.  Recently sensitive to Coumadin doses, with Coumadin held 6/10-6/13 when INR >2.  Last Coumadin dose 0.5 mg on 6/14, then held 6/15 for procedure.   INR is down to 2.15 today. Hgb 8.0, low stable, platelet count 207.  Weekly Aranesp given today.    Home Coumadin regimen: 6 mg MWF, 5 mg TTSS, but has recently been sensitive to Coumadin doses.  Little PO intake, albumin 1.6  Goal of Therapy:  INR 2-3 Monitor platelets by anticoagulation protocol: Yes   Plan:   Resume Coumadin with 1 mg x 1 today.  Daily PT/INR.  Arty Baumgartner, [place] Pager: [phone] 07/26/2015,3:29 PM

## 2015-07-26 NOTE — Telephone Encounter (Signed)
Called and spoke with [name] at [facility] - the patient has been approved for 8 additional weeks of coverage ending on July 4th. [name] states that this requires monthly updates so that they are aware of his status. We will need to call and repeat this process again if additional time will be needed past July 4th.  Called [name] (wife) and LM to return call

## 2015-07-26 NOTE — Progress Notes (Signed)
Assessment/ Plan: Pt is a 75 y.o. yo male who was admitted on 05/31/2015 with hematuria/hematoma of solitary kidney- has been HD dependent since 4/26 Assessment/Plan: 1.New ESRD- had solitary kidney at baseline- underwent coil embolization for renal hematoma - has been HD dependent since 4/26 so is at ESRD - need to make arrangements for OP hemodialysis/CLIP pending- has PC and will get AVF on Monday (INR 2.5 ?) . Patient understands that this is most likely permanent at this time- running on TTS schedule here via [facility]-  2. GU- has had indwelling cath for 12 years. 3. Anemia- hgb 8- on darbe- also iron low- repleting 4. Secondary hyperparathyroidism- PTH was 65- is on low dose calcitriol- no binder- phos is 3.1 5. HTN/volume- BP low- no meds- on midodrine- has pitting edema- BP limits UF  6. Dispo-  pt has been accepted at [facility]- has AVF and getting coumadin back on board.   We now have an OP dialysis unit(MWF  11:15 at [facility]) for him but need to prove he can sit in a chair for treatment. Will dialyze Wed in a chair  Subjective: Interval History: Currently receiving dialysis.  Objective: Vital signs in last 24 hours: Temp:  [97.8 F (36.6 C)-99.1 F (37.3 C)] 98.4 F (36.9 C) (06/20 0735) Pulse Rate:  [77-107] 99 (06/20 1000) Resp:  [19-26] 21 (06/20 1000) BP: (84-116)/(53-73) 93/69 mmHg (06/20 1000) SpO2:  [89 %-100 %] 100 % (06/20 1000) Weight:  [112.5 kg (248 lb 0.3 oz)] 112.5 kg (248 lb 0.3 oz) (06/20 0735) Weight change:   Intake/Output from previous day: 06/19 0701 - 06/20 0700 In: 680 [P.O.:180; I.V.:500] Out: 155 [Urine:150; Blood:5] Intake/Output this shift:    General appearance: alert, cooperative and weak voice Resp: clear to auscultation bilaterally Chest wall: no tenderness Cardio: regular rate and rhythm, S1, S2 normal, no murmur, click, rub or gallop Extremities: AVF LUE with thrill, 1+ LE edema  Lab Results:  Recent Labs  07/25/15 0511  07/26/15 0800  WBC 5.0 5.4  HGB 8.2* 8.0*  HCT 28.1* 27.5*  PLT 210 207   BMET:  Recent Labs  07/25/15 0511 07/26/15 0800  NA 132* 133*  K 3.9 3.9  CL 99* 100*  CO2 23 24  GLUCOSE 76 71  BUN 43* 55*  CREATININE 3.92* 4.86*  CALCIUM 7.9* 8.1*   No results for input(s): PTH in the last 72 hours. Iron Studies: No results for input(s): IRON, TIBC, TRANSFERRIN, FERRITIN in the last 72 hours. Studies/Results: No results found.  Scheduled: . antiseptic oral rinse  7 mL Mouth Rinse BID  . calcitRIOL  0.25 mcg Oral Q M,W,F  . Darbepoetin Alfa      . darbepoetin (ARANESP) injection - DIALYSIS  200 mcg Intravenous Q Tue-HD  . feeding supplement (PRO-STAT SUGAR FREE 64)  30 mL Oral TID WC  . insulin aspart  0-15 Units Subcutaneous TID WC  . insulin aspart  0-5 Units Subcutaneous QHS  . insulin NPH Human  13 Units Subcutaneous BID AC & HS  . LORazepam  1 mg Oral QHS  . midodrine  10 mg Oral TID AC  . multivitamin  1 tablet Oral QHS  . pantoprazole  40 mg Oral Q1200      LOS: 56 days   Jordayn Mink C 07/26/2015,10:49 AM

## 2015-07-26 NOTE — Progress Notes (Signed)
PROGRESS NOTE                                                                                                                                                                                                             Patient Demographics:    [name], is a 75 y.o. male, DOB - [birth year], [id number]  Admit date - 05/31/2015   Admitting Physician Rigoberto Noel, MD  Outpatient Primary MD for the patient is [facility]  LOS - 56  Outpatient Specialists:   Chief Complaint  Patient presents with  . Hypotension       Brief Narrative   75 year old male with history of A. fib, DVT and PE on Coumadin, type 2 diabetes mellitus, renal cell carcinoma status post left nephrectomy, dysrhythmia, hyperlipidemia, prostatic CA status post radiation 2011 who was seen in the ED on 4/22 4:00 catheter which was irrigated and replaced. He had hematuria for quite some time which was attributed to scarring from prior radiation. However when he had his INR checked on 4/25 it was 8 and patient was found to be hypotensive with blood pressure 70/44 mmHg and sent to the ED. In the ED patient was started to have UTI and despite open 5 L IV bolus he was hypotensive along with sodium of 124, K of 5, lactate of 3 and serum creatinine of 9.36. Patient admitted to ICU for septic shock secondary to UTI and acute kidney injury.  Significant Events: 4/25 admitted by [facility] w/ septic shock due to UTI + ARF  4/26 Renal u/s > s/p Lt nephrectomy 4/27 CT abd/pelvis > Rt perinephric hematoma 4/30 TRH assumed care  5/02 CT abd/pelvis > increased size of hematoma 5/03 Cardiac arrest w/ severe anemia > coil embolization R kidney by IR; PTX post-CPR 5/08 TTE EF 65-70% 5/16 Reintubated due to airway secretions 5/18 Doppler legs b/l > Acute DVT Rt gastrocnemius vein, age indeterminate DVT Rt femoral vein and popliteal vein > heparin gtt 5/23 TTE Moderate LVH - EF  65-70%. No wall motion abnormalities. LA mildly dilated. No AS or AR.  5/25 LLE Arterial Duplex: No evidence of stenosis or occluded arteries. 5/27- extubated 5/28 off cvvhd 6/4 CorTrak tube removed 6/19 AV fistula   Subjective:    No overnight issues. S/p AV fistula   Assessment  & Plan :   Fever  on 6/13-14 Likely acute viral illness. Urine culture growing gram-negative rods but could be colonization. No further fever. Blood cultures negative. No antibiotics given.  Acute on chronic kidney disease, now ESRD  Has a tunneled catheter. Plan on permanent access next week. Hemodialysis onTu/Th/Sat schedule  Nephrology following. LUE AV fistula placed  today.  He is now long term HD  per renal. Awaiting  to be CLIPed  Septic/hemorrhagic shock with acute blood loss anemia Secondary to right perinephric hematoma. Hemoglobin remained stable. INR therapeutic.  Septic shock due to Klebsiella pyelonephritis and bacteremia. Completed 14 day antibiotic course on 5/10.  Right gastrocnemius vein acute DVT, right femoral and popliteal vein DVTs On Coumadin. Dosing per pharmacy.Monitor H&H closely.  A. fib rate controlled.resume coumadin today ( after fistula placed)  Chronic diastolic CHF.  Volume management with dialysis. Euvolemic  Cardiac arrest on 5/3, followed by pneumothorax Suspected to be due to severe anemia.  OSA on CPAP   Chronic hematuria Scheduled foley flushes  Ischemic changes of left foot Arterial duplex without notable findings. Has palpable distal pulses.  Hoarseness of voice post extubation  will ask SLP for therapy    Code Status :Full code  Family Communication  : Wife at bedside  Disposition Plan  :  SNF  Barriers For Discharge : pending to be CLIPED  Consults  :   Nephrology [facility] CM Orthopedics Urology Vascular surgery   Procedures  :  See hospital course  DVT Prophylaxis  :  Coumadin  Lab Results  Component Value Date   PLT 207  07/26/2015    Antibiotics  :   IV Rocephin 4/28-5/10  Anti-infectives    Start     Dose/Rate Route Frequency Ordered Stop   07/25/15 0600  cefUROXime (ZINACEF) 1.5 g in dextrose 5 % 50 mL IVPB     1.5 g 100 mL/hr over 30 Minutes Intravenous To ShortStay Surgical 07/23/15 0939 07/25/15 1345   06/22/15 2300  vancomycin (VANCOCIN) IVPB 1000 mg/200 mL premix  Status:  Discontinued     1,000 mg 200 mL/hr over 60 Minutes Intravenous Every 24 hours 06/21/15 2118 06/22/15 0921   06/21/15 2300  cefTAZidime (FORTAZ) 2 g in dextrose 5 % 50 mL IVPB  Status:  Discontinued     2 g 100 mL/hr over 30 Minutes Intravenous Every 12 hours 06/21/15 2118 06/22/15 0908   06/21/15 2200  vancomycin (VANCOCIN) 2,000 mg in sodium chloride 0.9 % 500 mL IVPB     2,000 mg 250 mL/hr over 120 Minutes Intravenous  Once 06/21/15 2118 06/22/15 0005   06/17/15 1200  ceFAZolin (ANCEF) 3 g in dextrose 5 % 50 mL IVPB     3 g 130 mL/hr over 30 Minutes Intravenous To Radiology 06/17/15 1156 06/17/15 1251   06/17/15 0924  ceFAZolin (ANCEF) 1-5 GM-% IVPB    Comments:  Covington, Jamie   : cabinet override      06/17/15 0924 06/17/15 1222   06/17/15 0924  ceFAZolin (ANCEF) 2-4 GM/100ML-% IVPB    Comments:  Soyla Dryer   : cabinet override      06/17/15 0924 06/17/15 1222   06/04/15 1100  cefTRIAXone (ROCEPHIN) 2 g in dextrose 5 % 50 mL IVPB     2 g 100 mL/hr over 30 Minutes Intravenous Every 24 hours 06/03/15 1038 06/15/15 1134   06/03/15 1800  cefTAZidime (FORTAZ) 1 g in dextrose 5 % 50 mL IVPB  Status:  Discontinued     1 g 100 mL/hr over  30 Minutes Intravenous Every 24 hours 06/03/15 0737 06/03/15 0956   06/03/15 1100  cefTRIAXone (ROCEPHIN) 1 g in dextrose 5 % 50 mL IVPB  Status:  Discontinued     1 g 100 mL/hr over 30 Minutes Intravenous Every 24 hours 06/03/15 0956 06/03/15 1038   06/02/15 1800  cefTAZidime (FORTAZ) 2 g in dextrose 5 % 50 mL IVPB  Status:  Discontinued     2 g 100 mL/hr over 30 Minutes  Intravenous Every 12 hours 06/02/15 1306 06/03/15 0737   05/31/15 1800  cefTAZidime (FORTAZ) 2 g in dextrose 5 % 50 mL IVPB  Status:  Discontinued     2 g 100 mL/hr over 30 Minutes Intravenous Every 48 hours 05/31/15 1444 06/02/15 1306   05/31/15 1130  vancomycin (VANCOCIN) 2,250 mg in sodium chloride 0.9 % 500 mL IVPB  Status:  Discontinued     2,250 mg 250 mL/hr over 120 Minutes Intravenous  Once 05/31/15 1124 05/31/15 1128   05/31/15 1130  vancomycin (VANCOCIN) 2,500 mg in sodium chloride 0.9 % 500 mL IVPB     2,500 mg 250 mL/hr over 120 Minutes Intravenous  Once 05/31/15 1128 05/31/15 1347   05/31/15 1115  piperacillin-tazobactam (ZOSYN) IVPB 3.375 g     3.375 g 100 mL/hr over 30 Minutes Intravenous  Once 05/31/15 1114 05/31/15 1239        Objective:   Filed Vitals:   07/26/15 1100 07/26/15 1117 07/26/15 1148 07/26/15 1200  BP: 90/64 103/61 104/65 104/76  Pulse: 95 90 100 99  Temp:  97.4 F (36.3 C) 98 F (36.7 C)   TempSrc:  Oral Oral   Resp: 23 21 20    Height:      Weight:  111.5 kg (245 lb 13 oz)    SpO2: 100% 100% 98% 97%    Wt Readings from Last 3 Encounters:  07/26/15 111.5 kg (245 lb 13 oz)  05/27/15 120.657 kg (266 lb)  05/10/15 126.735 kg (279 lb 6.4 oz)     Intake/Output Summary (Last 24 hours) at 07/26/15 1601 Last data filed at 07/26/15 1117  Gross per 24 hour  Intake    180 ml  Output   1000 ml  Net   -820 ml     Physical Exam  Gen: not in distress HEENT:  moist mucosa, supple neck Chest: clear b/l, no added sounds , HD catheter over left chest CVS:  S1&S2 irregular, no murmurs,  GI: soft, NT, ND, BS+, Chronic foley Musculoskeletal: warm, no edema, Left AVF site clean     Data Review:    CBC  Recent Labs Lab 07/20/15 0520 07/21/15 0644 07/23/15 0705 07/25/15 0511 07/26/15 0800  WBC 4.1 4.4 5.1 5.0 5.4  HGB 8.3* 8.0* 8.1* 8.2* 8.0*  HCT 28.4* 27.0* 27.6* 28.1* 27.5*  PLT 155 177 183 210 207  MCV 93.1 91.5 91.1 91.5 92.3  MCH  27.2 27.1 26.7 26.7 26.8  MCHC 29.2* 29.6* 29.3* 29.2* 29.1*  RDW 18.5* 18.7* 18.4* 18.4* 18.3*    Chemistries   Recent Labs Lab 07/21/15 0644 07/22/15 0845 07/23/15 0706 07/25/15 0511 07/26/15 0800  NA 137 136 133* 132* 133*  K 4.3 3.4* 3.2* 3.9 3.9  CL 100* 100* 98* 99* 100*  CO2 29 26 26 23 24   GLUCOSE 73 101* 87 76 71  BUN 51* 30* 49* 43* 55*  CREATININE 4.43* 3.19* 4.01* 3.92* 4.86*  CALCIUM 8.0* 8.0* 7.8* 7.9* 8.1*   ------------------------------------------------------------------------------------------------------------------ No results for input(s): CHOL, HDL,  LDLCALC, TRIG, CHOLHDL, LDLDIRECT in the last 72 hours.  Lab Results  Component Value Date   HGBA1C 7.8* 06/07/2015   ------------------------------------------------------------------------------------------------------------------ No results for input(s): TSH, T4TOTAL, T3FREE, THYROIDAB in the last 72 hours.  Invalid input(s): FREET3 ------------------------------------------------------------------------------------------------------------------ No results for input(s): VITAMINB12, FOLATE, FERRITIN, TIBC, IRON, RETICCTPCT in the last 72 hours.  Coagulation profile  Recent Labs Lab 07/22/15 0845 07/23/15 0540 07/24/15 0400 07/25/15 0511 07/26/15 0425  INR 2.93* 2.70* 2.52* 2.17* 2.15*    No results for input(s): DDIMER in the last 72 hours.  Cardiac Enzymes No results for input(s): CKMB, TROPONINI, MYOGLOBIN in the last 168 hours.  Invalid input(s): CK ------------------------------------------------------------------------------------------------------------------ No results found for: BNP  Inpatient Medications  Scheduled Meds: . antiseptic oral rinse  7 mL Mouth Rinse BID  . calcitRIOL  0.25 mcg Oral Q M,W,F  . Darbepoetin Alfa      . darbepoetin (ARANESP) injection - DIALYSIS  200 mcg Intravenous Q Tue-HD  . feeding supplement (PRO-STAT SUGAR FREE 64)  30 mL Oral TID WC  .  insulin aspart  0-15 Units Subcutaneous TID WC  . insulin aspart  0-5 Units Subcutaneous QHS  . insulin NPH Human  13 Units Subcutaneous BID AC & HS  . LORazepam  1 mg Oral QHS  . midodrine  10 mg Oral TID AC  . multivitamin  1 tablet Oral QHS  . pantoprazole  40 mg Oral Q1200  . warfarin  1 mg Oral ONCE-1800  . Warfarin - Pharmacist Dosing Inpatient   Does not apply q1800   Continuous Infusions: . sodium chloride Stopped (07/12/15 1200)  . sodium chloride     PRN Meds:.acetaminophen (TYLENOL) oral liquid 160 mg/5 mL, albuterol, benzocaine, guaiFENesin, menthol-cetylpyridinium, ondansetron, oxyCODONE, phenol, RESOURCE THICKENUP CLEAR, sodium chloride flush  Micro Results Recent Results (from the past 240 hour(s))  Culture, blood (routine x 2)     Status: None   Collection Time: 07/20/15 11:29 AM  Result Value Ref Range Status   Specimen Description BLOOD RIGHT ANTECUBITAL  Final   Special Requests BOTTLES DRAWN AEROBIC AND ANAEROBIC  10CC  Final   Culture NO GROWTH 5 DAYS  Final   Report Status 07/25/2015 FINAL  Final  Culture, blood (routine x 2)     Status: None   Collection Time: 07/20/15 11:50 AM  Result Value Ref Range Status   Specimen Description BLOOD RIGHT ANTECUBITAL  Final   Special Requests BOTTLES DRAWN AEROBIC AND ANAEROBIC  5CC  Final   Culture NO GROWTH 5 DAYS  Final   Report Status 07/25/2015 FINAL  Final  Culture, Urine     Status: Abnormal   Collection Time: 07/20/15  7:17 PM  Result Value Ref Range Status   Specimen Description URINE, CATHETERIZED  Final   Special Requests NONE  Final   Culture (A)  Final    >=100,000 COLONIES/mL KLEBSIELLA PNEUMONIAE Confirmed Extended Spectrum Beta-Lactamase Producer (ESBL)    Report Status 07/24/2015 FINAL  Final   Organism ID, Bacteria KLEBSIELLA PNEUMONIAE (A)  Final      Susceptibility   Klebsiella pneumoniae - MIC*    AMPICILLIN >=32 RESISTANT Resistant     CEFAZOLIN 8 RESISTANT Resistant     CEFTRIAXONE <=1  RESISTANT Resistant     CIPROFLOXACIN 0.5 SENSITIVE Sensitive     GENTAMICIN 4 SENSITIVE Sensitive     IMIPENEM <=0.25 SENSITIVE Sensitive     NITROFURANTOIN 32 SENSITIVE Sensitive     TRIMETH/SULFA <=20 SENSITIVE Sensitive     AMPICILLIN/SULBACTAM  4 SENSITIVE Sensitive     PIP/TAZO <=4 SENSITIVE Sensitive     * >=100,000 COLONIES/mL KLEBSIELLA PNEUMONIAE  Surgical pcr screen     Status: None   Collection Time: 07/24/15 10:14 PM  Result Value Ref Range Status   MRSA, PCR NEGATIVE NEGATIVE Final   Staphylococcus aureus NEGATIVE NEGATIVE Final    Comment:        The Xpert SA Assay (FDA approved for NASAL specimens in patients over 27 years of age), is one component of a comprehensive surveillance program.  Test performance has been validated by [facility] for patients greater than or equal to 78 year old. It is not intended to diagnose infection nor to guide or monitor treatment.     Radiology Reports Dg Shoulder Right  07/07/2015  CLINICAL DATA:  Right shoulder pain. Recent rotator cuff surgery. No known injury. EXAM: RIGHT SHOULDER - 2+ VIEW COMPARISON:  Single-view of the chest 06/19/2015 and 07/06/2015. FINDINGS: There is lucency in the greater tuberosity. There appears to be a small focus of cortical disruption present. Mild appearing acromioclavicular degenerative change is seen. Dialysis catheter and feeding tube are noted. IMPRESSION: Small cortical defect in the greater tuberosity of the right humeral head and increased lucency in the greater tuberosity could be due to infection or related to the patient's recent surgery. Projection is different than on prior chest x-rays but the findings appear more conspicuous. Electronically Signed   By: Inge Rise M.D.   On: 07/07/2015 17:24   Dg Chest Port 1 View  07/20/2015  CLINICAL DATA:  Fevers EXAM: PORTABLE CHEST 1 VIEW COMPARISON:  07/06/2015 FINDINGS: Dialysis catheter is again noted and stable. Cardiac shadow remains  enlarged. A feeding catheter is been removed in the interval. The lungs are clear. Healing left rib fractures are noted. IMPRESSION: No acute abnormality noted. Electronically Signed   By: Inez Catalina M.D.   On: 07/20/2015 10:23   Dg Chest Port 1 View  07/06/2015  CLINICAL DATA:  Acute renal failure. EXAM: PORTABLE CHEST 1 VIEW COMPARISON:  06/27/2015 . FINDINGS: Interim extubation. Dialysis catheter and left subclavian central line in stable position. Feeding tube in stable position. Stable cardiomegaly. Persistent left base subsegmental atelectasis and or infiltrate with slight interim progression. No pleural effusion or pneumothorax. IMPRESSION: 1. Interim extubation. Right dialysis catheter left subclavian central line stable position. Feeding tube in stable position. 2. Slight interim progression of left base atelectasis and or infiltrate. Electronically Signed   By: [place]   On: 07/06/2015 07:39   Dg Chest Port 1 View  06/27/2015  CLINICAL DATA:  Respiratory failure.  Endotracheal tube placement EXAM: PORTABLE CHEST 1 VIEW COMPARISON:  06/25/2015 FINDINGS: Endotracheal tube in good position. Feeding tube enters the stomach with the tip not visualized. Left subclavian central venous catheter tip in the SVC. Right jugular central venous catheter tip in the SVC. These are unchanged. No pneumothorax. Mild left lower lobe airspace disease is unchanged. Negative for heart failure or pleural effusion. IMPRESSION: Support lines remain in good position and unchanged Left lower lobe atelectasis/infiltrate unchanged. Electronically Signed   By: Franchot Gallo M.D.   On: 06/27/2015 07:10   Dg Swallowing Func-speech Pathology  07/14/2015  Objective Swallowing Evaluation: Type of Study: MBS-Modified Barium Swallow Study Patient Details Name: BAUTISTA SPIEGLER MRN: [id number] Date of Birth: 1940/05/17 Today's Date: 07/14/2015 Time: SLP Start Time (ACUTE ONLY): 1341-SLP Stop Time (ACUTE ONLY): 1350 SLP Time  Calculation (min) (ACUTE ONLY): 9 min Past Medical  History: Past Medical History Diagnosis Date . Obesity  . Phlebitis    Lower extermity . Pulmonary emboli ([place]) 2008   submassive, saddle . Prostate cancer ([place]) 07/2009 . Sleep apnea    on CPAP . Hx of echocardiogram 12/04/2010   Normal EF >55% no significant valve disease . History of stress test 06/27/2009   Low risk and EF of approximately 50% . DVT (deep venous thrombosis) ([place])  . Chronic kidney disease, stage 3    baseline creatinine ~1.4 . HLD (hyperlipidemia)  . HTN (hypertension)  . Dysrhythmia    A fib . Diabetes mellitus ([place])    diet controlled . History of hiatal hernia  Past Surgical History: Past Surgical History Procedure Laterality Date . Nephrectomy  1999   for CA . Cholecystectomy  1999 . Transurethral resection of bladder tumor N/A 03/04/2013   Procedure: CYSTOSCOPY WITH RIGHT RETROGRADE PYELOGRAM AND BLADDER BIOPSY /CLOT EVACUATION/ BIOPSY PROSTATIC URETHRA WITH FULGERATION ;  Surgeon: Molli Hazard, MD;  Location: [facility];  Service: Urology;  Laterality: N/A; . Cystoscopy w/ retrogrades N/A 04/08/2013   Procedure: CYSTOSCOPY WITH CLOT EVACUATION;  Surgeon: Molli Hazard, MD;  Location: [facility];  Service: Urology;  Laterality: N/A; . Left shoulder repair   . Shoulder open rotator cuff repair Right 05/03/2015   Procedure: RIGHT SHOULDER ROTATOR CUFF REPAIR OPEN WITH GRAFT AND ANCHOR ;  Surgeon: Latanya Maudlin, MD;  Location: [facility];  Service: Orthopedics;  Laterality: Right; HPI: 75 yo male with hemorrhagic shock leading to cardiac arrest, VDRF, AKI from Rt perinephric hematoma. He was intubated 5/03-5/13, 5/16-5/27. He has hx of Lt renal cell carcinoma s/p Lt nephrectomy, prostate cancer s/p XRT, PE in 2008, OSA, HTN, HLD, DM, A fib. Subjective: pt alert, pleasant Assessment / Plan / Recommendation CHL IP CLINICAL IMPRESSIONS 07/14/2015 Therapy Diagnosis Mild pharyngeal phase dysphagia;Moderate pharyngeal phase dysphagia Clinical  Impression Pt shows improvements in his mild-moderate oropharyngeal dysphagia since previous MBS. Oral phase overall is strong and effective, although he does have piecemeal swallowing particularly with liquids. He continues to have a brief delay in swallow trigger but now with most boluses being contained within the valleculae prior to initiation. The exception is when he consumes thin liquids via straw, as they reach the pyriform sinuses and are subsequently aspirated before the swallow. Cued coughing is productive of secretions mixed with barium, although it is unclear if all aspirates were expelled. A mild-moderate amount of residue still remains with solid textures. Recommend to continue Dys 2 diet but with advancement to thin liquids by cup. Anticipate further improvements as overall strength continues to progress. Impact on safety and function Mild aspiration risk   CHL IP TREATMENT RECOMMENDATION 07/14/2015 Treatment Recommendations Therapy as outlined in treatment plan below   Prognosis 07/14/2015 Prognosis for Safe Diet Advancement Good Barriers to Reach Goals -- Barriers/Prognosis Comment -- CHL IP DIET RECOMMENDATION 07/14/2015 SLP Diet Recommendations Dysphagia 2 (Fine chop) solids;Thin liquid Liquid Administration via Cup;No straw Medication Administration Whole meds with puree Compensations Slow rate;Small sips/bites;Multiple dry swallows after each bite/sip Postural Changes Remain semi-upright after after feeds/meals (Comment);Seated upright at 90 degrees   CHL IP OTHER RECOMMENDATIONS 07/14/2015 Recommended Consults -- Oral Care Recommendations Oral care BID Other Recommendations Order thickener from pharmacy;Prohibited food (jello, ice cream, thin soups);Remove water pitcher   CHL IP FOLLOW UP RECOMMENDATIONS 07/14/2015 Follow up Recommendations LTACH   CHL IP FREQUENCY AND DURATION 07/14/2015 Speech Therapy Frequency (ACUTE ONLY) min 2x/week Treatment Duration 2 weeks  CHL IP ORAL PHASE 07/14/2015 Oral  Phase Impaired Oral - Pudding Teaspoon -- Oral - Pudding Cup -- Oral - Honey Teaspoon -- Oral - Honey Cup -- Oral - Nectar Teaspoon NT Oral - Nectar Cup NT Oral - Nectar Straw NT Oral - Thin Teaspoon NT Oral - Thin Cup Piecemeal swallowing Oral - Thin Straw Piecemeal swallowing Oral - Puree WFL Oral - Mech Soft WFL Oral - Regular -- Oral - Multi-Consistency -- Oral - Pill -- Oral Phase - Comment --  CHL IP PHARYNGEAL PHASE 07/14/2015 Pharyngeal Phase Impaired Pharyngeal- Pudding Teaspoon -- Pharyngeal -- Pharyngeal- Pudding Cup -- Pharyngeal -- Pharyngeal- Honey Teaspoon -- Pharyngeal -- Pharyngeal- Honey Cup -- Pharyngeal -- Pharyngeal- Nectar Teaspoon NT Pharyngeal -- Pharyngeal- Nectar Cup NT Pharyngeal -- Pharyngeal- Nectar Straw NT Pharyngeal -- Pharyngeal- Thin Teaspoon NT Pharyngeal -- Pharyngeal- Thin Cup Delayed swallow initiation-vallecula;Reduced tongue base retraction Pharyngeal Material does not enter airway Pharyngeal- Thin Straw Delayed swallow initiation-pyriform sinuses;Penetration/Aspiration before swallow;Reduced tongue base retraction Pharyngeal Material enters airway, passes BELOW cords without attempt by patient to eject out (silent aspiration) Pharyngeal- Puree Delayed swallow initiation-vallecula;Reduced tongue base retraction;Pharyngeal residue - valleculae Pharyngeal -- Pharyngeal- Mechanical Soft Delayed swallow initiation-vallecula;Reduced tongue base retraction;Pharyngeal residue - valleculae Pharyngeal -- Pharyngeal- Regular -- Pharyngeal -- Pharyngeal- Multi-consistency -- Pharyngeal -- Pharyngeal- Pill -- Pharyngeal -- Pharyngeal Comment --  CHL IP CERVICAL ESOPHAGEAL PHASE 07/14/2015 Cervical Esophageal Phase WFL Pudding Teaspoon -- Pudding Cup -- Honey Teaspoon -- Honey Cup -- Nectar Teaspoon -- Nectar Cup -- Nectar Straw -- Thin Teaspoon -- Thin Cup -- Thin Straw -- Puree -- Mechanical Soft -- Regular -- Multi-consistency -- Pill -- Cervical Esophageal Comment -- No flowsheet data  found. Germain Osgood, M.A. CCC-SLP [phone] Germain Osgood 07/14/2015, 2:47 PM              Dg Swallowing Func-speech Pathology  07/05/2015  Objective Swallowing Evaluation: Type of Study: MBS-Modified Barium Swallow Study Patient Details Name: CALLON HERRIDGE MRN: [id number] Date of Birth: December 10, 1940 Today's Date: 07/05/2015 Time: SLP Start Time (ACUTE ONLY): 1051-SLP Stop Time (ACUTE ONLY): 1114 SLP Time Calculation (min) (ACUTE ONLY): 23 min Past Medical History: Past Medical History Diagnosis Date . Obesity  . Phlebitis    Lower extermity . Pulmonary emboli ([place]) 2008   submassive, saddle . Prostate cancer ([place]) 07/2009 . Sleep apnea    on CPAP . Hx of echocardiogram 12/04/2010   Normal EF >55% no significant valve disease . History of stress test 06/27/2009   Low risk and EF of approximately 50% . DVT (deep venous thrombosis) ([place])  . Chronic kidney disease, stage 3    baseline creatinine ~1.4 . HLD (hyperlipidemia)  . HTN (hypertension)  . Dysrhythmia    A fib . Diabetes mellitus ([place])    diet controlled . History of hiatal hernia  Past Surgical History: Past Surgical History Procedure Laterality Date . Nephrectomy  1999   for CA . Cholecystectomy  1999 . Transurethral resection of bladder tumor N/A 03/04/2013   Procedure: CYSTOSCOPY WITH RIGHT RETROGRADE PYELOGRAM AND BLADDER BIOPSY /CLOT EVACUATION/ BIOPSY PROSTATIC URETHRA WITH FULGERATION ;  Surgeon: Molli Hazard, MD;  Location: [facility];  Service: Urology;  Laterality: N/A; . Cystoscopy w/ retrogrades N/A 04/08/2013   Procedure: CYSTOSCOPY WITH CLOT EVACUATION;  Surgeon: Molli Hazard, MD;  Location: [facility];  Service: Urology;  Laterality: N/A; . Left shoulder repair   . Shoulder open rotator cuff repair Right 05/03/2015   Procedure: RIGHT SHOULDER ROTATOR CUFF  REPAIR OPEN WITH GRAFT AND ANCHOR ;  Surgeon: Latanya Maudlin, MD;  Location: [facility];  Service: Orthopedics;  Laterality: Right; HPI: 75 yo male with hemorrhagic shock leading to  cardiac arrest, VDRF, AKI from Rt perinephric hematoma. He was intubated 5/03-5/13, 5/16-5/27. He has hx of Lt renal cell carcinoma s/p Lt nephrectomy, prostate cancer s/p XRT, PE in 2008, OSA, HTN, HLD, DM, A fib. Subjective: pt alert, pleasant Assessment / Plan / Recommendation CHL IP CLINICAL IMPRESSIONS 07/05/2015 Therapy Diagnosis Mild oral phase dysphagia;Moderate pharyngeal phase dysphagia Clinical Impression Pt has a mild-moderate oropharyngeal dysphagia with prolonged intubation and generalized deconditioning resulting in sensorimotor deficits. Oral phase is mildly prolonged with reduced bolus cohesion but ultimately with adequate oral clearance. Thin liquids reach the pyriform sinuses prior to swallow trigger and are subsequently aspirated before the swallow. This does elicit a reflexive cough, but it is not sufficient to expel aspirates. Min cues for smaller boluses of thin liquids results in silent penetration, which does clear with cued throat clearing before it can fall beneath the glottis. He has good airway protection with nectar thick liquids and solids, despite larger quantities. Reduced hyolaryngeal movement, epiglottic inversion, and base of tongue retraction result in moderate pharyngeal residue, but this is reduced with Min cues for second swallows. Given the above, would recommend more conservative diet of Dys 2 textures and nectar thick liquids. Good prognosis for diet advancement with increased time post-extubation and improved overall strength. Impact on safety and function Mild aspiration risk   CHL IP TREATMENT RECOMMENDATION 07/05/2015 Treatment Recommendations Therapy as outlined in treatment plan below   Prognosis 07/05/2015 Prognosis for Safe Diet Advancement Good Barriers to Reach Goals -- Barriers/Prognosis Comment -- CHL IP DIET RECOMMENDATION 07/05/2015 SLP Diet Recommendations Dysphagia 2 (Fine chop) solids;Nectar thick liquid Liquid Administration via Cup;Straw Medication  Administration Crushed with puree Compensations Minimize environmental distractions;Slow rate;Small sips/bites;Multiple dry swallows after each bite/sip Postural Changes Remain semi-upright after after feeds/meals (Comment);Seated upright at 90 degrees   CHL IP OTHER RECOMMENDATIONS 07/05/2015 Recommended Consults -- Oral Care Recommendations Oral care BID Other Recommendations Order thickener from pharmacy;Prohibited food (jello, ice cream, thin soups);Remove water pitcher   CHL IP FOLLOW UP RECOMMENDATIONS 07/05/2015 Follow up Recommendations LTACH   CHL IP FREQUENCY AND DURATION 07/05/2015 Speech Therapy Frequency (ACUTE ONLY) min 2x/week Treatment Duration 2 weeks      CHL IP ORAL PHASE 07/05/2015 Oral Phase Impaired Oral - Pudding Teaspoon -- Oral - Pudding Cup -- Oral - Honey Teaspoon -- Oral - Honey Cup -- Oral - Nectar Teaspoon Delayed oral transit;Decreased bolus cohesion Oral - Nectar Cup Delayed oral transit;Decreased bolus cohesion Oral - Nectar Straw Delayed oral transit;Decreased bolus cohesion Oral - Thin Teaspoon Delayed oral transit;Decreased bolus cohesion Oral - Thin Cup Delayed oral transit;Decreased bolus cohesion Oral - Thin Straw -- Oral - Puree Delayed oral transit;Decreased bolus cohesion Oral - Mech Soft Delayed oral transit;Decreased bolus cohesion;Impaired mastication Oral - Regular -- Oral - Multi-Consistency -- Oral - Pill -- Oral Phase - Comment --  CHL IP PHARYNGEAL PHASE 07/05/2015 Pharyngeal Phase Impaired Pharyngeal- Pudding Teaspoon -- Pharyngeal -- Pharyngeal- Pudding Cup -- Pharyngeal -- Pharyngeal- Honey Teaspoon -- Pharyngeal -- Pharyngeal- Honey Cup -- Pharyngeal -- Pharyngeal- Nectar Teaspoon Delayed swallow initiation-vallecula;Reduced epiglottic inversion;Reduced anterior laryngeal mobility;Reduced laryngeal elevation;Reduced tongue base retraction;Pharyngeal residue - valleculae Pharyngeal -- Pharyngeal- Nectar Cup Delayed swallow initiation-vallecula;Reduced epiglottic  inversion;Reduced anterior laryngeal mobility;Reduced laryngeal elevation;Reduced tongue base retraction;Pharyngeal residue - valleculae Pharyngeal -- Pharyngeal- Nectar Straw Delayed swallow  initiation-vallecula;Reduced epiglottic inversion;Reduced anterior laryngeal mobility;Reduced laryngeal elevation;Reduced tongue base retraction;Pharyngeal residue - valleculae Pharyngeal -- Pharyngeal- Thin Teaspoon Delayed swallow initiation-vallecula;Reduced epiglottic inversion;Reduced anterior laryngeal mobility;Reduced laryngeal elevation;Reduced tongue base retraction;Pharyngeal residue - valleculae Pharyngeal -- Pharyngeal- Thin Cup Reduced epiglottic inversion;Reduced anterior laryngeal mobility;Reduced laryngeal elevation;Reduced tongue base retraction;Pharyngeal residue - valleculae;Delayed swallow initiation-pyriform sinuses;Penetration/Aspiration before swallow Pharyngeal Material enters airway, passes BELOW cords and not ejected out despite cough attempt by patient Pharyngeal- Thin Straw -- Pharyngeal -- Pharyngeal- Puree Delayed swallow initiation-vallecula;Reduced epiglottic inversion;Reduced anterior laryngeal mobility;Reduced laryngeal elevation;Reduced tongue base retraction;Pharyngeal residue - valleculae Pharyngeal -- Pharyngeal- Mechanical Soft Delayed swallow initiation-vallecula;Reduced epiglottic inversion;Reduced anterior laryngeal mobility;Reduced laryngeal elevation;Reduced tongue base retraction;Pharyngeal residue - valleculae Pharyngeal -- Pharyngeal- Regular -- Pharyngeal -- Pharyngeal- Multi-consistency -- Pharyngeal -- Pharyngeal- Pill -- Pharyngeal -- Pharyngeal Comment --  CHL IP CERVICAL ESOPHAGEAL PHASE 07/05/2015 Cervical Esophageal Phase WFL Pudding Teaspoon -- Pudding Cup -- Honey Teaspoon -- Honey Cup -- Nectar Teaspoon -- Nectar Cup -- Nectar Straw -- Thin Teaspoon -- Thin Cup -- Thin Straw -- Puree -- Mechanical Soft -- Regular -- Multi-consistency -- Pill -- Cervical Esophageal Comment  -- No flowsheet data found. Germain Osgood, M.A. CCC-SLP [phone] Germain Osgood 07/05/2015, 12:06 PM               Time Spent in minutes  20   Louellen Molder M.D on 07/26/2015 at 4:01 PM  Between 7am to 7pm - Pager - [phone]  After 7pm go to [email or web address] - password [facility]  [facility] -  Office  [phone]

## 2015-07-26 NOTE — Progress Notes (Signed)
PT Cancellation Note  Patient Details Name: [name] MRN: [id number] DOB: May 18, 1940   Cancelled Treatment:    Reason Eval/Treat Not Completed: Patient at procedure or test/unavailable   Currently in HD;  Will follow up later today as time allows;  Otherwise, will follow up for PT tomorrow;   Thank you,  Roney Marion, PT  Acute Rehabilitation Services Pager [phone] Office [phone]     Roney Marion [facility] 07/26/2015, 8:12 AM

## 2015-07-26 NOTE — Telephone Encounter (Signed)
Spoke with pt wife, aware of the information below per [employer]. Will hold on to this message to follow up

## 2015-07-26 NOTE — Progress Notes (Addendum)
  Postoperative hemodialysis access     Date of Surgery:  07/25/15 Surgeon: Oneida Alar  Subjective:  No complaints-denies hand pain  PHYSICAL EXAMINATION:  Filed Vitals:   07/26/15 0900 07/26/15 0930  BP: 91/63 101/64  Pulse: 93 107  Temp:    Resp: 22 25    Incision is clean and dry Sensation in digits is intact;  There is  Thrill  The graft/fistula is palpable    ASSESSMENT/PLAN:  [name] is a 75 y.o. year old male who is s/p left brachial cephalic AVF.  -graft/fistula is patent -pt does not have evidence of steal sx--denies hand pain -f/u with Dr. Oneida Alar in 4-6 weeks to check maturation of AVF -will sign off-call as needed.   Leontine Locket, PA-C [facility] [phone]  Agree with above follow up 6 weeks  Ruta Hinds, MD [facility] [place] Office: [phone] Pager: [phone]

## 2015-07-26 NOTE — Progress Notes (Signed)
Nutrition Follow-up  DOCUMENTATION CODES:   Obesity unspecified  INTERVENTION:  Continue 30 ml Prostat po TID, each supplement provides 100 kcal and 15 grams of protein.   Provide Nepro Shake po once daily, each supplement provides 425 kcal and 19 grams protein.  Encourage adequate PO intake.   NUTRITION DIAGNOSIS:   Inadequate oral intake related to dysphagia as evidenced by meal completion < 25%; ongoing  GOAL:   Patient will meet greater than or equal to 90% of their needs; progressing  MONITOR:   PO intake, Supplement acceptance, Labs, Weight trends, I & O's  REASON FOR ASSESSMENT:   Ventilator    ASSESSMENT:   75 y.o. male with PMH as outlined below including prostate CA s/p radiation 2011 and RCC s/p left nephrectomy. He was seen in ED 4/22 for clogged catheter which was irrigated and replaced. He apparently has had hematuria for quite some time which has been attributed to scarring from prior radiation.  AV fistula placed yesterday. Meal completion has been 0-50%. Pt reports his wife will bring meals for him. Pt currently has Prostat ordered and would like to continue with them. RD to additionally order Nepro Shake to aid in caloric and protein needs. Per RN, wife would like to speak to RD, however wife unavailable during attempted time of visit. RD to speak with wife at revisit.   Labs and medications reviewed.   Diet Order:  DIET DYS 2 Room service appropriate?: Yes; Fluid consistency:: Thin; Fluid restriction:: 1200 mL Fluid  Skin:   (+1 generalized edema)  Last BM:  6/18  Height:   Ht Readings from Last 1 Encounters:  07/16/15 5' 9.5" (1.765 m)    Weight:   Wt Readings from Last 1 Encounters:  07/26/15 245 lb 13 oz (111.5 kg)    Ideal Body Weight:  72.7 kg  BMI:  Body mass index is 35.79 kg/(m^2).  Estimated Nutritional Needs:   Kcal:  1950-2150  Protein:  110-130 gm  Fluid:  1.2 L  EDUCATION NEEDS:   No education needs identified at  this time  Corrin Parker, MS, RD, LDN Pager # [phone] After hours/ weekend pager # [phone]

## 2015-07-26 NOTE — Progress Notes (Signed)
Placed patient on CPAP via FFM, previous settings (12.0 cm H20) with 2 lpm O2 bleed in.  Patient tolerating well at this time.

## 2015-07-26 NOTE — Progress Notes (Signed)
Occupational Therapy Treatment Patient Details Name: [name] MRN: [id number] DOB: Mar 12, 1940 Today's Date: 07/26/2015    History of present illness Pt adm with septic shock likely due to UTI. Pt with renal failure and on CVVHD. Pt then with Rt perinephric hematoma that required embolization and pt with VDRF. Cardiac arrest 5/3. Reintubated 5/16. Extubated 5/27. S/p Left Brachial Cephalic AV fistula placement on 6/19; PMH - recent (3/28) rt rotator cuff repair, Lt renal cell carcinoma s/p Lt nephrectomy, prostate cancer s/p XRT, PE in 2008, OSA, HTN, HLD, DM, A fib.   OT comments  Pt seen as cotreat with PT this session. Pt fatigued from HD. Pt has overall general deconditioning but is willing to participate with therapy. Pt motivated to "get better". Will attempt to alternate OT/PT in order to try to provide daily therpay for pt as HD schedule allows. Recommend staff use maximove to mobilize pt OOB daily. Continue to recommend SNF for extensive rehab. Will continue to folow acutely and reassess goals next session.   Follow Up Recommendations  SNF;Supervision/Assistance - 24 hour    Equipment Recommendations  Other (comment)    Recommendations for Other Services      Precautions / Restrictions Precautions Precautions: Fall Type of Shoulder Precautions: RTC repiar 04/2015 - per Cay Schillings 6/5 pt can use his RUE however he wants within his pain tolerance. Only self ROM Precaution Comments: Per Dr Titus Mould who called Dr. Gladstone Lighter,  Pt able to weight bear through Rt UE, but no ROM of Rt shoulder. Sent fax to [phone] 6/5 asking for specific ROM guildeline as pt is over 2 months out from repair.        Mobility Bed Mobility Overal bed mobility: Needs Assistance;+2 for physical assistance Bed Mobility: Rolling;Sidelying to Sit Rolling: +2 for physical assistance;Mod assist Sidelying to sit: +2 for safety/equipment;Max assist       General bed mobility comments: Heavy mod assist to  roll Right; cues for technique; +2 max assist to elevate trunk to sit, and stabilize while adjusting feet for better base of support in sitting  Transfers Overall transfer level: Needs assistance Equipment used: 2 person hand held assist Transfers: Lateral/Scoot Transfers          Lateral/Scoot Transfers: +2 physical assistance;Max assist General transfer comment: Performed lateral scoot transfer towards recliner with armrest down on pt's R side; multimodal cues to help with weight shift anteriorly to unweigh hips for scooting; knees blocked for stability, and used bed pad, cradling hips during transition to chair    Balance     Sitting balance-Leahy Scale: Poor Sitting balance - Comments: Sat EOB approx 5 minutes with work on sitting balance, and anterior weight shift as he was tending to posterior lean Postural control: Posterior lean                         ADL Overall ADL's : Needs assistance/impaired Eating/Feeding: Minimal assistance   Grooming: Wash/dry hands;Wash/dry face;Set up;Sitting                               Functional mobility during ADLs: Maximal assistance;+2 for physical assistance (lateral scoot transfer) General ADL Comments: Pt has declined since evaluation given prolonged hospitalization and proceudres. Used lateral scoot transfer with bed pad to move pt into drop arm recliner. Pt unable to tolerate standing today due to fatigue      Vision  Perception     Praxis      Cognition   Behavior During Therapy: WFL for tasks assessed/performed Overall Cognitive Status: Within Functional Limits for tasks assessed       Memory: Decreased short-term memory               Extremity/Trunk Assessment               Exercises General Exercises - Upper Extremity Shoulder Flexion: AROM;Left;15 reps;Seated Shoulder ABduction: AROM;Left;15 reps;Seated Shoulder Horizontal ABduction: AROM;Left;15  reps;Seated Elbow Flexion: AROM;Left;15 reps;Right Elbow Extension: AROM;Right;Left;15 reps Other Exercises Other Exercises: self RUE ROM with assistance of LUE within pain toleracne   Shoulder Instructions       General Comments      Pertinent Vitals/ Pain       Pain Assessment: Faces Faces Pain Scale: Hurts even more Pain Location: all over Pain Descriptors / Indicators: Discomfort;Grimacing Pain Intervention(s): Patient requesting pain meds-RN notified;Limited activity within patient's tolerance  Home Living                                          Prior Functioning/Environment              Frequency Min 2X/week     Progress Toward Goals  OT Goals(current goals can now be found in the care plan section)  Progress towards OT goals: Progressing toward goals  Acute Rehab OT Goals Patient Stated Goal: get stronger and return home OT Goal Formulation: With patient Time For Goal Achievement: 08/01/15 Potential to Achieve Goals: Good ADL Goals Pt Will Perform Grooming: with supervision;with set-up;sitting Pt Will Perform Upper Body Bathing: with set-up;with supervision;sitting Pt Will Transfer to Toilet: with +2 assist;with min assist;bedside commode;stand pivot transfer Pt/caregiver will Perform Home Exercise Program: Increased ROM;Increased strength;Right Upper extremity;With Supervision;With written HEP provided  Plan Discharge plan remains appropriate    Co-evaluation    PT/OT/SLP Co-Evaluation/Treatment: Yes Reason for Co-Treatment: Complexity of the patient's impairments (multi-system involvement);For patient/therapist safety PT goals addressed during session: Mobility/safety with mobility OT goals addressed during session: ADL's and self-care;Strengthening/ROM      End of Session     Activity Tolerance Patient limited by fatigue   Patient Left in chair;with call bell/phone within reach;with nursing/sitter in room   Nurse  Communication Mobility status;Need for lift equipment;Patient requests pain meds        Time: 1530-1600 OT Time Calculation (min): 30 min  Charges: OT General Charges $OT Visit: 1 Procedure OT Treatments $Self Care/Home Management : 8-22 mins  Lamarr Feenstra,HILLARY 07/26/2015, 5:24 PM   [facility], OTR/L  [phone] 07/26/2015

## 2015-07-26 NOTE — NC FL2 (Signed)
[place] LEVEL OF CARE SCREENING TOOL     IDENTIFICATION  Patient Name: [name] Birthdate: Dec 12, 1940 Sex: male Admission Date (Current Location): 05/31/2015  [facility] [place] Number:  Herbalist and Address:  [facility] [place] [place] [address], [place], [place] [address]      Provider Number: [id number]  Attending Physician Name and Address:  Louellen Molder, MD  Relative Name and Phone Number:  Emoni Kayton - wife. [phone]    Current Level of Care: Hospital Recommended Level of Care: [place] (Patient from [facility]) Prior Approval Number:    Date Approved/Denied:   PASRR Number: [id number]  Discharge Plan: SNF    Current Diagnoses: Patient Active Problem List   Diagnosis Date Noted  . Pneumothorax   . Traumatic pneumothorax   . OSA on CPAP   . Acute on chronic renal failure ([place])   . Hemorrhagic shock   . S/p nephrectomy   . Sepsis due to Klebsiella ([place])   . OSA (obstructive sleep apnea)   . Chronic diastolic CHF (congestive heart failure) ([place])   . Cardiac arrest ([place])   . Acute deep vein thrombosis (DVT) of distal vein of right lower extremity ([place])   . Acute renal failure (ARF) ([place])   . Acute hypoxemic respiratory failure ([place])   . Acute back pain   . Bleeding   . Chest tube in place   . Perinephric hematoma   . Right flank pain   . Acute respiratory failure ([place])   . Bacteremia due to Klebsiella pneumoniae   . Secondary cardiomyopathy ([place])   . Chronic atrial fibrillation ([place])   . Uncontrolled type 2 diabetes mellitus with complication ([place])   . Dyspnea   . Acute renal failure ([place]) 06/02/2015  . Encounter for central line placement   . Septic shock ([place]) 05/31/2015  . Renal failure   . Urinary tract infectious disease   . Arterial hypotension   . Increased anion gap metabolic acidosis   . Hyponatremia   . Rotator cuff tear 05/03/2015  . Atrial  fibrillation ([place]) 11/10/2014  . Obesity (BMI 30-39.9) 11/10/2014  . Type II or unspecified type diabetes mellitus with unspecified complication, uncontrolled 04/06/2013  . Anemia 04/04/2013  . 1St degree AV block 03/07/2013  . Acute blood loss anemia 03/06/2013  . Acute-on-chronic kidney injury, baseline CKD stage 3 03/06/2013  . Hypotension, unspecified 03/06/2013  . Bradycardia 03/06/2013  . Perineal pain 03/06/2013  . Preoperative clearance 03/03/2013  . Gross hematuria 03/02/2013  . Severe obstructive sleep apnea 12/04/2012  . Morbid obesity ([place]) 12/04/2012  . Bilateral lower extremity edema 12/04/2012  . Venous insufficiency 12/04/2012  . History of nephrectomy, unilateral 12/04/2012  . Chronic anticoagulation 12/04/2012  . History of pulmonary embolism 12/04/2012  . Essential hypertension 12/04/2012  . Cardiac murmur 12/04/2012    Orientation RESPIRATION BLADDER Height & Weight     Self, Place, Situation  Normal Continent (Patient has urinary catheter) Weight: 245 lb 13 oz (111.5 kg) Height:  5' 9.5" (176.5 cm)  BEHAVIORAL SYMPTOMS/MOOD NEUROLOGICAL BOWEL NUTRITION STATUS   (None)  (None) Continent Diet (DYS 2)  AMBULATORY STATUS COMMUNICATION OF NEEDS Skin   Total Care (Patient unable to ambulate) Verbally Normal, Other (Comment) (Incision left arm)                       Personal Care Assistance Level of Assistance  Bathing, Feeding, Dressing Bathing  Assistance: Maximum assistance Feeding assistance: Limited assistance Dressing Assistance: Maximum assistance     Functional Limitations Info  Sight, Hearing, Speech Sight Info: Adequate Hearing Info: Adequate Speech Info: Adequate    SPECIAL CARE FACTORS FREQUENCY  PT (By licensed PT), OT (By licensed OT), Speech therapy     PT Frequency: Evaluation 5/15 and a minimum of 3X per week therapy recommended OT Frequency: Evaluated 5/29 and a minimum of 2X per week therapy recommended     Speech Therapy  Frequency: Evaluated 5/16      Contractures Contractures Info: Not present    Additional Factors Info  Code Status, Allergies, Insulin Sliding Scale, Isolation Precautions Code Status Info: Full Allergies Info: No known allergies   Insulin Sliding Scale Info: 0-5 Units at daily at bedtime. 0-15 Units 3X per day with meals Isolation Precautions Info: Patient on contact precautions     Current Medications (07/26/2015):  This is the current hospital active medication list Current Facility-Administered Medications  Medication Dose Route Frequency Provider Last Rate Last Dose  . 0.9 %  sodium chloride infusion   Intravenous Continuous Rigoberto Noel, MD   Stopped at 07/12/15 1200  . 0.9 %  sodium chloride infusion   Intravenous Continuous Roberts Gaudy, MD      . acetaminophen (TYLENOL) solution 650 mg  650 mg Oral Q6H PRN Cherene Altes, MD   650 mg at 07/25/15 2118  . albuterol (PROVENTIL) (2.5 MG/3ML) 0.083% nebulizer solution 2.5 mg  2.5 mg Nebulization Q2H PRN Chesley Mires, MD      . antiseptic oral rinse (CPC / CETYLPYRIDINIUM CHLORIDE 0.05%) solution 7 mL  7 mL Mouth Rinse BID Allie Bossier, MD   7 mL at 07/26/15 1316  . benzocaine (ORAJEL) 10 % mucosal gel   Mouth/Throat TID PRN Nishant Dhungel, MD      . calcitRIOL (ROCALTROL) 1 MCG/ML solution 0.25 mcg  0.25 mcg Oral Q M,W,F Cherene Altes, MD   0.25 mcg at 07/25/15 2124  . Darbepoetin Alfa (ARANESP) 200 MCG/0.4ML injection           . Darbepoetin Alfa (ARANESP) injection 200 mcg  200 mcg Intravenous Q Tue-HD Mauricia Area, MD   200 mcg at 07/26/15 0849  . feeding supplement (PRO-STAT SUGAR FREE 64) liquid 30 mL  30 mL Oral TID WC Cherene Altes, MD   30 mL at 07/26/15 1311  . guaiFENesin (ROBITUSSIN) 100 MG/5ML solution 100 mg  5 mL Oral Q4H PRN Cherene Altes, MD   100 mg at 07/25/15 2227  . insulin aspart (novoLOG) injection 0-15 Units  0-15 Units Subcutaneous TID WC Cherene Altes, MD   2 Units at 07/24/15 1749  .  insulin aspart (novoLOG) injection 0-5 Units  0-5 Units Subcutaneous QHS Cherene Altes, MD   0 Units at 07/11/15 2114  . insulin NPH Human (HUMULIN N,NOVOLIN N) injection 13 Units  13 Units Subcutaneous BID AC & HS [name], MD   13 Units at 07/25/15 2228  . LORazepam (ATIVAN) tablet 1 mg  1 mg Oral QHS Nishant Dhungel, MD   1 mg at 07/25/15 2118  . menthol-cetylpyridinium (CEPACOL) lozenge 3 mg  1 lozenge Oral PRN Rhetta Mura Schorr, NP      . midodrine (PROAMATINE) tablet 10 mg  10 mg Oral TID AC Chesley Mires, MD   10 mg at 07/26/15 1310  . multivitamin (RENA-VIT) tablet 1 tablet  1 tablet Oral QHS Fleet Contras, MD  1 tablet at 07/25/15 2118  . ondansetron (ZOFRAN) injection 4 mg  4 mg Intravenous Q6H PRN Gardiner Barefoot, NP   4 mg at 06/08/15 0109  . oxyCODONE (Oxy IR/ROXICODONE) immediate release tablet 5 mg  5 mg Oral Q4H PRN Allie Bossier, MD   5 mg at 07/15/15 1102  . pantoprazole (PROTONIX) EC tablet 40 mg  40 mg Oral Q1200 Cherene Altes, MD   40 mg at 07/26/15 1310  . phenol (CHLORASEPTIC) mouth spray 1 spray  1 spray Mouth/Throat PRN Rhetta Mura Schorr, NP      . RESOURCE THICKENUP CLEAR   Oral PRN [name], MD      . sodium chloride flush (NS) 0.9 % injection 10-40 mL  10-40 mL Intracatheter PRN Colbert Coyer, MD   10 mL at 07/09/15 2202     Discharge Medications: Please see discharge summary for a list of discharge medications.  Relevant Imaging Results:  Relevant Lab Results:   Additional Information [phone]. Patient wears CPAP at night.  Dailysis MWF at [facility] - 2nd shift.  First treatment will be at 11:15 for paperwork and HD.  Sable Feil, LCSW

## 2015-07-26 NOTE — Clinical Social Work Note (Signed)
CSW talked with patient and his wife [name] at the bedside regarding his dialysis schedule, skilled nursing facility for rehab and transportation. Per Bethena Roys, dialysis secretary's note, patient will receive dialysis at [facility], MWF (start date not indicated yet) and his first treatment time will be 11:15, in order to complete paperwork. Patient/wife informed of dialysis schedule and CSW confirmed that [facility] is the facility patient will discharge to for rehab. Discussed [facility] transportation and advised patient/wife that [facility] application will be provided for Mrs. Youkhana to complete and CSW will then fax to [facility] office.  Contact made with Santiago Glad, admissions director at [facility] and update provided. Santiago Glad will initiate insurance authorization on Wed.  CSW will continue to work with patient/wife and facility to get dialysis transportation in place and ensure a smooth transition to [facility] when medically stable for discharge.  Mersadez Linden Givens, MSW, LCSW Licensed Clinical Social Worker [place] [phone]

## 2015-07-26 NOTE — Progress Notes (Signed)
Physical Therapy Treatment Patient Details Name: [name] MRN: [id number] DOB: 06/15/1940 Today's Date: 07/26/2015    History of Present Illness Pt adm with septic shock likely due to UTI. Pt with renal failure and on CVVHD. Pt then with Rt perinephric hematoma that required embolization and pt with VDRF. Cardiac arrest 5/3. Reintubated 5/16. Extubated 5/27. S/p Left Brachial Cephalic AV fistula placement on 6/19; PMH - recent (3/28) rt rotator cuff repair, Lt renal cell carcinoma s/p Lt nephrectomy, prostate cancer s/p XRT, PE in 2008, OSA, HTN, HLD, DM, A fib.    PT Comments    Very fatigued post HD, and LUE still painful post fistula placement; Session focused on transfer OOB to chair to give [name] options for getting up and moving, and pt performed a lateral scoot transfer with +2 heavy assist;   Still, I believe that the [facility] does have a hoyer lift at their facility, and provided that they put a sling under him before leaving for HD (which is the norm for those who have difficulty transferring), they can use the mechanical lift to get to HD recliner;   Will continue to work on transfers and gait as [name] becomes ready; Agree with having HD sessions in recliner   Follow Up Recommendations  SNF     Equipment Recommendations  Rolling walker with 5" wheels;3in1 (PT);Wheelchair (measurements PT);Wheelchair cushion (measurements PT)    Recommendations for Other Services       Precautions / Restrictions Precautions Precautions: Fall Type of Shoulder Precautions: RTC repiar 04/2015 - per Cay Schillings 6/5 pt can use his RUE however he wants within his pain tolerance. Only self ROM Precaution Comments: Per Dr Titus Mould who called Dr. Gladstone Lighter,  Pt able to weight bear through Rt UE, but no ROM of Rt shoulder. Sent fax to [phone] 6/5 asking for specific ROM guildeline as pt is over 2 months out from repair.     Mobility  Bed Mobility Overal bed mobility: Needs Assistance;+2 for  physical assistance;+ 2 for safety/equipment Bed Mobility: Rolling;Sidelying to Sit Rolling: +2 for physical assistance;Mod assist Sidelying to sit: +2 for safety/equipment;Max assist       General bed mobility comments: Heavy mod assist to roll Right; cues for technique; +2 max assist to elevate trunk to sit, and stabilize while adjusting feet for better base of support in sitting  Transfers Overall transfer level: Needs assistance Equipment used: 2 person hand held assist (3rd person to steady recliner) Transfers: Lateral/Scoot Transfers          Lateral/Scoot Transfers: +2 physical assistance;Max assist General transfer comment: Performed lateral scoot transfer towards recliner with armrest down on pt's R side; multimodal cues to help with weight shift anteriorly to unweigh hips for scooting; knees blocked for stability, and used bed pad, cradling hips during transition to chair  Ambulation/Gait                 Stairs            Wheelchair Mobility    Modified Rankin (Stroke Patients Only)       Balance     Sitting balance-Leahy Scale: Poor Sitting balance - Comments: Sat EOB approx 5 minutes with work on sitting balance, and anterior weight shift as he was tending to posterior lean Postural control: Posterior lean                          Cognition Arousal/Alertness: Awake/alert Behavior During Therapy:  WFL for tasks assessed/performed Overall Cognitive Status: Within Functional Limits for tasks assessed       Memory: Decreased short-term memory              Exercises      General Comments        Pertinent Vitals/Pain Pain Assessment: Faces Faces Pain Scale: Hurts even more Pain Location: LUE, R shoulder; noting grimace with most movements Pain Descriptors / Indicators: Grimacing;Discomfort;Aching Pain Intervention(s): Limited activity within patient's tolerance;Patient requesting pain meds-RN notified;Repositioned    Home  Living                      Prior Function            PT Goals (current goals can now be found in the care plan section) Acute Rehab PT Goals Patient Stated Goal: get stronger and return home PT Goal Formulation: With patient Time For Goal Achievement: 08/01/15 Potential to Achieve Goals: Fair Progress towards PT goals: Not progressing toward goals - comment (Very fatigued and painful after HD this am and fistula placement yesterday)    Frequency  Min 3X/week    PT Plan Current plan remains appropriate    Co-evaluation PT/OT/SLP Co-Evaluation/Treatment: Yes Reason for Co-Treatment: Complexity of the patient's impairments (multi-system involvement);For patient/therapist safety PT goals addressed during session: Mobility/safety with mobility       End of Session Equipment Utilized During Treatment: Gait belt Activity Tolerance: Patient limited by fatigue Patient left: in chair;with call bell/phone within reach     Time: 1530-1600 PT Time Calculation (min) (ACUTE ONLY): 30 min  Charges:  $Therapeutic Activity: 8-22 mins                    G Codes:      [name] 07/26/2015, 4:40 PM  [name], [place] Pager [phone] Office [phone]

## 2015-07-26 NOTE — Progress Notes (Signed)
07/26/2015 8:32 AM Hemodialysis Outpatient Note; Mr. Lafon will begin dialysis treatments at the [facility] center on a Monday, Wednesday and Friday 2nd shift schedule.  He will need to present himself at 11:15 AM for his first treatment to sign paperwork and consents. We are waiting for confirmation of acceptance from the  Medical Director. Thank you. Gordy Savers

## 2015-07-27 DIAGNOSIS — G4733 Obstructive sleep apnea (adult) (pediatric): Secondary | ICD-10-CM

## 2015-07-27 DIAGNOSIS — I824Z1 Acute embolism and thrombosis of unspecified deep veins of right distal lower extremity: Secondary | ICD-10-CM

## 2015-07-27 DIAGNOSIS — E8809 Other disorders of plasma-protein metabolism, not elsewhere classified: Secondary | ICD-10-CM | POA: Diagnosis present

## 2015-07-27 DIAGNOSIS — I5032 Chronic diastolic (congestive) heart failure: Secondary | ICD-10-CM

## 2015-07-27 DIAGNOSIS — E1165 Type 2 diabetes mellitus with hyperglycemia: Secondary | ICD-10-CM

## 2015-07-27 DIAGNOSIS — I469 Cardiac arrest, cause unspecified: Secondary | ICD-10-CM

## 2015-07-27 DIAGNOSIS — E118 Type 2 diabetes mellitus with unspecified complications: Secondary | ICD-10-CM

## 2015-07-27 LAB — CBC
HCT: 29.4 % — ABNORMAL LOW (ref 39.0–52.0)
Hemoglobin: 8.4 g/dL — ABNORMAL LOW (ref 13.0–17.0)
MCH: 26.1 pg (ref 26.0–34.0)
MCHC: 28.6 g/dL — ABNORMAL LOW (ref 30.0–36.0)
MCV: 91.3 fL (ref 78.0–100.0)
PLATELETS: 232 10*3/uL (ref 150–400)
RBC: 3.22 MIL/uL — ABNORMAL LOW (ref 4.22–5.81)
RDW: 18.2 % — AB (ref 11.5–15.5)
WBC: 5.3 10*3/uL (ref 4.0–10.5)

## 2015-07-27 LAB — RENAL FUNCTION PANEL
ALBUMIN: 1.5 g/dL — AB (ref 3.5–5.0)
Anion gap: 8 (ref 5–15)
BUN: 44 mg/dL — AB (ref 6–20)
CO2: 26 mmol/L (ref 22–32)
CREATININE: 3.49 mg/dL — AB (ref 0.61–1.24)
Calcium: 7.9 mg/dL — ABNORMAL LOW (ref 8.9–10.3)
Chloride: 101 mmol/L (ref 101–111)
GFR calc Af Amer: 19 mL/min — ABNORMAL LOW (ref >60)
GFR, EST NON AFRICAN AMERICAN: 16 mL/min — AB (ref >60)
Glucose, Bld: 101 mg/dL — ABNORMAL HIGH (ref 65–99)
PHOSPHORUS: 3 mg/dL (ref 2.5–4.6)
Potassium: 3.5 mmol/L (ref 3.5–5.1)
SODIUM: 135 mmol/L (ref 135–145)

## 2015-07-27 LAB — GLUCOSE, CAPILLARY
GLUCOSE-CAPILLARY: 69 mg/dL (ref 65–99)
Glucose-Capillary: 98 mg/dL (ref 65–99)
Glucose-Capillary: 131 mg/dL — ABNORMAL HIGH (ref 65–99)

## 2015-07-27 LAB — PREPARE RBC (CROSSMATCH)

## 2015-07-27 LAB — PROTIME-INR
INR: 2.08 — AB (ref 0.00–1.49)
PROTHROMBIN TIME: 23.2 s — AB (ref 11.6–15.2)

## 2015-07-27 MED ORDER — LIDOCAINE HCL (PF) 1 % IJ SOLN: 5.0000 mL | INTRAMUSCULAR | Status: DC | PRN

## 2015-07-27 MED ORDER — SODIUM CHLORIDE 0.9 % IV SOLN: Freq: Once | INTRAVENOUS | Status: DC

## 2015-07-27 MED ORDER — HEPARIN SODIUM (PORCINE) 1000 UNIT/ML DIALYSIS: 20.0000 [IU]/kg | INTRAMUSCULAR | Status: DC | PRN

## 2015-07-27 MED ORDER — SODIUM CHLORIDE 0.9 % IV SOLN: 100.0000 mL | INTRAVENOUS | Status: DC | PRN

## 2015-07-27 MED ORDER — PANTOPRAZOLE SODIUM 40 MG PO TBEC
40.0000 mg | DELAYED_RELEASE_TABLET | Freq: Two times a day (BID) | ORAL | Status: DC
  Administered 2015-07-28 – 2015-07-31 (×7): 40 mg via ORAL
  Filled 2015-07-27 (×8): qty 1

## 2015-07-27 MED ORDER — PENTAFLUOROPROP-TETRAFLUOROETH EX AERO: 1.0000 "application " | INHALATION_SPRAY | CUTANEOUS | Status: DC | PRN

## 2015-07-27 MED ORDER — ALTEPLASE 2 MG IJ SOLR: 2.0000 mg | Freq: Once | INTRAMUSCULAR | Status: DC | PRN

## 2015-07-27 MED ORDER — HEPARIN SODIUM (PORCINE) 1000 UNIT/ML DIALYSIS: 1000.0000 [IU] | INTRAMUSCULAR | Status: DC | PRN

## 2015-07-27 MED ORDER — CALCITRIOL 1 MCG/ML IV SOLN
INTRAVENOUS | Status: AC
  Filled 2015-07-27: qty 1

## 2015-07-27 MED ORDER — MIDODRINE HCL 5 MG PO TABS
ORAL_TABLET | ORAL | Status: AC
  Filled 2015-07-27: qty 2

## 2015-07-27 MED ORDER — NYSTATIN 100000 UNIT/ML MT SUSP
5.0000 mL | Freq: Four times a day (QID) | OROMUCOSAL | Status: DC
  Administered 2015-07-27 – 2015-07-31 (×13): 500000 [IU] via ORAL
  Filled 2015-07-27 (×12): qty 5

## 2015-07-27 MED ORDER — LIDOCAINE-PRILOCAINE 2.5-2.5 % EX CREA: 1.0000 "application " | TOPICAL_CREAM | CUTANEOUS | Status: DC | PRN

## 2015-07-27 MED ORDER — INSULIN NPH (HUMAN) (ISOPHANE) 100 UNIT/ML ~~LOC~~ SUSP
10.0000 [IU] | Freq: Two times a day (BID) | SUBCUTANEOUS | Status: DC
  Administered 2015-07-28 – 2015-07-30 (×5): 10 [IU] via SUBCUTANEOUS
  Filled 2015-07-27: qty 10

## 2015-07-27 MED ORDER — WARFARIN SODIUM 1 MG PO TABS
1.0000 mg | ORAL_TABLET | Freq: Once | ORAL | Status: AC
  Administered 2015-07-27: 1 mg via ORAL
  Filled 2015-07-27 (×2): qty 1

## 2015-07-27 MED ORDER — POLYETHYLENE GLYCOL 3350 17 G PO PACK
17.0000 g | PACK | Freq: Every day | ORAL | Status: DC
  Administered 2015-07-28: 17 g via ORAL
  Filled 2015-07-27 (×2): qty 1

## 2015-07-27 NOTE — Procedures (Signed)
Sitting in chair on dialysis, weak and soft BP.  We will give one unit PRBC to see if this helps. [name] C

## 2015-07-27 NOTE — Progress Notes (Signed)
ANTICOAGULATION CONSULT NOTE - Follow Up Consult  Pharmacy Consult for  Coumadin Indication: atrial fibrillation and hx PE and DVT, new right leg DVT 06/16/15  No Known Allergies  Patient Measurements: Height: 5' 9.5" (176.5 cm) Weight:  (On a recliner. Unable to weigh pt.) IBW/kg (Calculated) : 71.85  Labs:  Recent Labs  07/25/15 0511 07/26/15 0425 07/26/15 0800 07/27/15 0608 07/27/15 0830  HGB 8.2*  --  8.0*  --  8.4*  HCT 28.1*  --  27.5*  --  29.4*  PLT 210  --  207  --  232  LABPROT 24.0* 23.8*  --  23.2*  --   INR 2.17* 2.15*  --  2.08*  --   CREATININE 3.92*  --  4.86*  --  3.49*    Estimated Creatinine Clearance: 23.4 mL/min (by C-G formula based on Cr of 3.49).  Assessment:   Coumadin resumed yesterday 07/26/15 for h/o afib,  PE (in 2008) and DVT, and new DVT 06/16/15. - On admit date 05/31/15, his INR was 7.08. Received several dose of vitamin K between 4/26 and 06/14/15.  -Coumadin held on admit 4/25 until resumed on 5/31 -Then coumadin had been on hold for procedure since 6/15, with plan to begin IV heparin when INR less than 2, but INR never dropped below 2 prior to procedure on 6/19. No heparin was needed. -Recently sensitive to Coumadin doses (resumed on 5/31) with Coumadin held 6/10-6/13 when INR >2.  Last Coumadin dose 0.5 mg on 6/14, then held 6/15 for procedure. - INR is down to 2.08 today. Hgb 8.4, low stable, platelet count 232.  Weekly Aranesp given yesterday 6/20. Nephrologist notes this AM that the patient while in HD is  weak and soft BP. Thus, to give one unit PRBC today. - Home Coumadin regimen: 6 mg MWF, 5 mg TTSS, but has recently been sensitive to Coumadin doses.  Little PO intake, albumin 1.6>1.5  Goal of Therapy:  INR 2-3 Monitor platelets by anticoagulation protocol: Yes   Plan:   Coumadin with 1 mg x 1 today.  Daily PT/INR.   Nicole Cella, RPh Clinical Pharmacist Pager: [phone] 07/27/2015,11:14 AM

## 2015-07-27 NOTE — Progress Notes (Signed)
PT Cancellation Note  Patient Details Name: [name] MRN: [id number] DOB: 1940/02/10   Cancelled Treatment:    Reason Eval/Treat Not Completed: Patient at procedure or test/unavailable   Currently in HD;  Will follow up later today as time allows;  Otherwise, will follow up for PT tomorrow;   Thank you,  Roney Marion, [place] Pager [phone] Office [phone]     Roney Marion [facility] 07/27/2015, 8:36 AM

## 2015-07-27 NOTE — Evaluation (Signed)
Clinical/Bedside Swallow Evaluation Patient Details  Name: [name] MRN: [id number] Date of Birth: Jul 06, 1940  Today's Date: 07/27/2015 Time: SLP Start Time (ACUTE ONLY): [id number] SLP Stop Time (ACUTE ONLY): 1500 SLP Time Calculation (min) (ACUTE ONLY): 15 min  Past Medical History:  Past Medical History  Diagnosis Date  . Obesity   . Phlebitis     Lower extermity  . Pulmonary emboli ([place]) 2008    submassive, saddle  . Prostate cancer ([place]) 07/2009  . Sleep apnea     on CPAP  . Hx of echocardiogram 12/04/2010    Normal EF >55% no significant valve disease  . History of stress test 06/27/2009    Low risk and EF of approximately 50%  . DVT (deep venous thrombosis) ([place])   . Chronic kidney disease, stage 3     baseline creatinine ~1.4  . HLD (hyperlipidemia)   . HTN (hypertension)   . Dysrhythmia     A fib  . Diabetes mellitus ([place])     diet controlled  . History of hiatal hernia    Past Surgical History:  Past Surgical History  Procedure Laterality Date  . Nephrectomy  1999    for CA  . Cholecystectomy  1999  . Transurethral resection of bladder tumor N/A 03/04/2013    Procedure: CYSTOSCOPY WITH RIGHT RETROGRADE PYELOGRAM AND BLADDER BIOPSY /CLOT EVACUATION/ BIOPSY PROSTATIC URETHRA WITH FULGERATION ;  Surgeon: Molli Hazard, MD;  Location: [facility];  Service: Urology;  Laterality: N/A;  . Cystoscopy w/ retrogrades N/A 04/08/2013    Procedure: CYSTOSCOPY WITH CLOT EVACUATION;  Surgeon: Molli Hazard, MD;  Location: [facility];  Service: Urology;  Laterality: N/A;  . Left shoulder repair    . Shoulder open rotator cuff repair Right 05/03/2015    Procedure: RIGHT SHOULDER ROTATOR CUFF REPAIR OPEN WITH GRAFT AND ANCHOR ;  Surgeon: Latanya Maudlin, MD;  Location: [facility];  Service: Orthopedics;  Laterality: Right;  . Av fistula placement Left 07/25/2015    Procedure: ARTERIOVENOUS (AV) FISTULA CREATION;  Surgeon: Elam Dutch, MD;  Location: [facility];  Service: Vascular;   Laterality: Left;  Axillary block with minimal sedation and supplemental local at operative site.   HPI:  75 yo male with hemorrhagic shock leading to cardiac arrest, VDRF, AKI from Rt perinephric hematoma. He was intubated 5/03-5/13, 5/16-5/27. He has hx of Lt renal cell carcinoma s/p Lt nephrectomy, prostate cancer s/p XRT, PE in 2008, OSA, HTN, HLD, DM, A fib. Two prior MBS studies, acute dysphagia this admission.  No improvement in phonation since last extubation.   Assessment / Plan / Recommendation Clinical Impression   Pt was being followed by SLP services during this admission for dysphagia.  Orders for SLP were  discontinued after MBS on 6/8 despite ongoing therapy plan.  New orders received 6/20. Pt appears to be tolerating POs relatively well with adequate, but laborious, mastication; brisk swallow response; ongoing, occasional coughing (<10% of time) after consumption of thin liquids.  Phonation remains completely aphonic with almost no ability to achieve voice and poor modulation of expiration.  Suspect vocal fold trauma s/p two intubations (impacting glottal closure necessary for voice and swallow).  Please consider ENT referral to evaluate larynx. Diet upgraded to dysphagia 3, thin liquids with basic aspiration precautions.        Aspiration Risk  Mild aspiration risk    Diet Recommendation   dysphagia 3, thin liquids  Medication Administration: Crushed with puree    Other  Recommendations Recommended Consults: Consider ENT evaluation Oral Care Recommendations: Oral care BID   Follow up Recommendations   (tba)    Frequency and Duration min 2x/week  2 weeks       Prognosis Prognosis for Safe Diet Advancement: Good      Swallow Study   General Date of Onset: 05/31/15 HPI: 75 yo male with hemorrhagic shock leading to cardiac arrest, VDRF, AKI from Rt perinephric hematoma. He was intubated 5/03-5/13, 5/16-5/27. He has hx of Lt renal cell carcinoma s/p Lt nephrectomy,  prostate cancer s/p XRT, PE in 2008, OSA, HTN, HLD, DM, A fib. Type of Study: Bedside Swallow Evaluation Previous Swallow Assessment: MBS 5/30 recommending Dys 2/nectar; repeat MBS 6/8: Pt shows improvements in his mild-moderate oropharyngeal dysphagia since previous MBS. Oral phase overall is strong and effective, although he does have piecemeal swallowing particularly with liquids. He continues to have a brief delay in swallow trigger but now with most boluses being contained within the valleculae prior to initiation. The exception is when he consumes thin liquids via straw, as they reach the pyriform sinuses and are subsequently aspirated before the swallow. Cued coughing is productive of secretions mixed with barium, although it is unclear if all aspirates were expelled. A mild-moderate amount of residue still remains with solid textures. Recommend to continue Dys 2 diet but with advancement to thin liquids by cup. Anticipate further improvements as overall strength continues to progress. Diet Prior to this Study: Dysphagia 2 (chopped);Thin liquids Temperature Spikes Noted: No Respiratory Status: Room air History of Recent Intubation: Yes Length of Intubations (days): 25 days Date extubated: 07/02/15 Behavior/Cognition: Alert;Cooperative;Pleasant mood Oral Cavity Assessment: Within Functional Limits Oral Care Completed by SLP: No Oral Cavity - Dentition: Adequate natural dentition Vision: Functional for self-feeding Self-Feeding Abilities: Able to feed self Patient Positioning: Upright in bed Baseline Vocal Quality: Aphonic Volitional Cough: Weak Volitional Swallow: Able to elicit    Oral/Motor/Sensory Function Overall Oral Motor/Sensory Function: Generalized oral weakness   Ice Chips Ice chips: Not tested   Thin Liquid Thin Liquid: Impaired Presentation: Cup;Self Fed Pharyngeal  Phase Impairments: Cough - Delayed (cough <10% trials)    Nectar Thick Nectar Thick Liquid: Not tested    Honey Thick Honey Thick Liquid: Not tested   Puree Puree: Within functional limits Presentation: Spoon   Solid   GO   Solid: Within functional limits Presentation: Self Fed Other Comments: self-limited, small boluses       Arianah Torgeson L. Tivis Ringer, [place] CCC/SLP Pager [phone]  Juan Quam Laurice 07/27/2015,3:01 PM

## 2015-07-27 NOTE — Progress Notes (Signed)
Physical Therapy Treatment Patient Details Name: [name] MRN: [id number] DOB: 1940/10/11 Today's Date: 07/27/2015    History of Present Illness Pt adm with septic shock likely due to UTI. Pt with renal failure and on CVVHD. Pt then with Rt perinephric hematoma that required embolization and pt with VDRF. Cardiac arrest 5/3. Reintubated 5/16. Extubated 5/27. S/p Left Brachial Cephalic AV fistula placement on 6/19; PMH - recent (3/28) rt rotator cuff repair, Lt renal cell carcinoma s/p Lt nephrectomy, prostate cancer s/p XRT, PE in 2008, OSA, HTN, HLD, DM, A fib.    PT Comments    Seen after HD again today, with noted better activity tolerance and sitting balance than yesterday; Abler to stand with Clarise Cruz lift; Encouraged that he could stand today  Follow Up Recommendations  SNF     Equipment Recommendations  Rolling walker with 5" wheels;3in1 (PT);Wheelchair (measurements PT);Wheelchair cushion (measurements PT)    Recommendations for Other Services       Precautions / Restrictions Precautions Precautions: Fall Type of Shoulder Precautions: RTC repiar 04/2015 - per Cay Schillings 6/5 pt can use his RUE however he wants within his pain tolerance. Only self ROM Precaution Comments: Per Dr Titus Mould who called Dr. Gladstone Lighter,  Pt able to weight bear through Rt UE, but no ROM of Rt shoulder. Sent fax to [phone] 6/5 asking for specific ROM guildeline as pt is over 2 months out from repair.     Mobility  Bed Mobility Overal bed mobility: Needs Assistance;+2 for physical assistance Bed Mobility: Sit to Supine       Sit to supine: Mod assist;+2 for physical assistance;+2 for safety/equipment   General bed mobility comments: Noting participation in lifting legs; mod assist and +2 to ease shoulders down and assist LEs onto bed  Transfers Overall transfer level: Needs assistance   Transfers: Sit to/from Stand Sit to Stand: +2 safety/equipment;+2 physical assistance;Mod assist          General transfer comment: Mod assist to keep trunk forward and facilitate anterior pelvic tilt to stay with Bil shoulder girdles, UEs and chest "locked in" with Clarise Cruz during lift to stand; Cues to push down through feet to come to stand  Ambulation/Gait                 Stairs            Wheelchair Mobility    Modified Rankin (Stroke Patients Only)       Balance     Sitting balance-Leahy Scale: Fair Sitting balance - Comments: Much improved sitting balance compared to yesterday; Much less posterior lean noted, and pt not needing physical assist to stay in upright sitting                            Cognition Arousal/Alertness: Awake/alert Behavior During Therapy: WFL for tasks assessed/performed Overall Cognitive Status: Within Functional Limits for tasks assessed       Memory: Decreased short-term memory              Exercises      General Comments        Pertinent Vitals/Pain Pain Assessment: Faces Faces Pain Scale: Hurts even more Pain Location: Grimace with most movements; did not specify location Pain Descriptors / Indicators: Grimacing;Discomfort Pain Intervention(s): Monitored during session    Home Living                      Prior Function  PT Goals (current goals can now be found in the care plan section) Acute Rehab PT Goals Patient Stated Goal: get stronger and return home PT Goal Formulation: With patient Time For Goal Achievement: 08/01/15 Potential to Achieve Goals: Fair Progress towards PT goals: Progressing toward goals    Frequency  Min 3X/week    PT Plan Current plan remains appropriate    Co-evaluation             End of Session Equipment Utilized During Treatment:  Clarise Cruz) Activity Tolerance: Patient limited by fatigue Patient left: in bed;with call bell/phone within reach;with family/visitor present     Time: 1350-1420 PT Time Calculation (min) (ACUTE ONLY): 30  min  Charges:  $Therapeutic Activity: 23-37 mins                    G Codes:      Quin Hoop 07/27/2015, 3:33 PM  Roney Marion, [place] Pager [phone] Office [phone]

## 2015-07-27 NOTE — Telephone Encounter (Signed)
[id number] is doing much better has had hemodialysis cath placed and is improving in strength daily.  Plan is for patient to go to rehab post hospitalization/Ashish Rossetti,BSN,RN3,CCM,CN

## 2015-07-27 NOTE — Progress Notes (Signed)
[facility] Progress Note  Patient: [name] [name]   PCP: Melinda Crutch DOB: [birth year]   DOA: 05/31/2015   DOS: 07/27/2015   Date of Service: the patient was seen and examined on 07/27/2015  Subjective: The patient is concerned regarding staying in the chair longer. Complains about leg swelling. Also concerned regarding occasional episodes of blood with BM which is described as bright red. Denies any chest pain or abdominal pain. Has minimal oral intake. No nausea no vomiting. Nutrition: Tolerating oral diet  Brief hospital course: Pt. with PMH of Prostate cancer status post radiation, RCC status post left nephrectomy, sleep apnea, prior DVT, CK-MB stage III, HTN, DM; admitted on 05/31/2015, with complaint of hypotension, was found to have septic shock due to UTI with acute kidney injury. Patient was admitted to ICU. Patient had elevated INR on admission and Klebsiella bacteremia causing septic shock. Treated with IV antibiotics. Patient had a peritoneal right perinephric hematoma which was initially treated conservatively with reversal of the INR. Patient developed cardiac arrest on 06/08/2015 and had successful ROC, patient underwent coil embolization of right bleeding site. Post CPR also developed pneumothorax on left requiring chest tube placement. Extubated on 06/18/2015 and reintubated on 06/21/2015 with extubation again on 07/02/2015. On 07/25/2015 underwent left brachiocephalic AV fistula placement. During the course on 06/23/2015 was found to have DVT and was started on heparin and transitioned to Coumadin. Currently further plan is continue monitoring for dialysis tolerance, BR BPR as well as improvement in oral intake.  Assessment and Plan: 1. ESRD on hemodialysis. New long-term HD Tolerating hemodialysis, today the patient was able to tolerate in chair. Patient already has a place for HD on Monday Wednesday Friday. Unsure about timing. We will await recommendation from  nephrology for HD. Receiving Aranesp, calcitriol, midodrine  2. Anemia of chronic kidney disease. H&H relatively stable. Status post 1 PRBC with HD on 07/27/2015. We will monitor patient's response tomorrow on 07/28/2015. FOBT is positive 1. No evidence of active bleeding reported by patient. Changing PPI to twice a day before meals.  3. Hypoalbuminemia Third spacing with pedal edema. Chronic diastolic CHF. Patient appears mildly volume overloaded. Getting hemodialysis to manage volume. We'll continue to closely monitor the patient's volume status.  4. Severe protein calorie malnutrition. Continue nutritional supplementation.  5. Pressure ulcers. Multiple site different stages. Frequent turning and continue wound care.  6. Type 2 diabetes mellitus. Blood sugars are running low due to poor oral intake. At present I would reduce the NPH dose from 13 units twice a day to 10 units twice a day and continue sliding scale insulin.  7. Poor oral intake. Monitor for improvement after change in the PPI. May need Megace.  8. Atrial fibrillation. Newly Diagnosed DVT  Back on Coumadin for anticoagulation. Rate relatively controlled. We will continue to monitor for evidence of bleeding  9. Chronic indwelling Foley catheter. Chronic hematuria. Currently resolved. We'll continue to monitor.  10. Septic shock (HCC)-currently resolved Klebsiella pneumoniae bacteremia probably from pyelonephritis. Also associated hemorrhagic shock from right perinephric hematoma. Cardiac arrest, followed by pneumothorax requiring chest tube, currently resolved.  Prolonged course of hospitalization due to elbow. Also had cardiac arrest on 5/3. Completed 14 days of antibiotic on 06/15/2015. At present no evidence of acute infection. Patient is at high risk for reinfection due to chronic indwelling Foley catheter.  Pain management: When necessary oxycodone Activity: snf hysical therapy Bowel  regimen: last BM 07/26/2015 Diet: Renal diet carb modified DVT Prophylaxis: Therapeutic and his examination  Advance goals of care discussion: Full code  Family Communication: family was present at bedside, at the time of interview. The pt provided permission to discuss medical plan with the family. Opportunity was given to ask question and all questions were answered satisfactorily.   Disposition:  Discharge to SNF. Expected discharge date: 07/29/2015, after hemodialysis  Consultants:  Nephrology [facility] Vascular surgery Procedures: AV fistula creation. Hemodialysis Echocardiogram Chest tube insertion Lower extremity Doppler  Antibiotics: Anti-infectives    Start     Dose/Rate Route Frequency Ordered Stop   07/25/15 0600  cefUROXime (ZINACEF) 1.5 g in dextrose 5 % 50 mL IVPB     1.5 g 100 mL/hr over 30 Minutes Intravenous To ShortStay Surgical 07/23/15 0939 07/25/15 1345   06/22/15 2300  vancomycin (VANCOCIN) IVPB 1000 mg/200 mL premix  Status:  Discontinued     1,000 mg 200 mL/hr over 60 Minutes Intravenous Every 24 hours 06/21/15 2118 06/22/15 0921   06/21/15 2300  cefTAZidime (FORTAZ) 2 g in dextrose 5 % 50 mL IVPB  Status:  Discontinued     2 g 100 mL/hr over 30 Minutes Intravenous Every 12 hours 06/21/15 2118 06/22/15 0908   06/21/15 2200  vancomycin (VANCOCIN) 2,000 mg in sodium chloride 0.9 % 500 mL IVPB     2,000 mg 250 mL/hr over 120 Minutes Intravenous  Once 06/21/15 2118 06/22/15 0005   06/17/15 1200  ceFAZolin (ANCEF) 3 g in dextrose 5 % 50 mL IVPB     3 g 130 mL/hr over 30 Minutes Intravenous To Radiology 06/17/15 1156 06/17/15 1251   06/17/15 0924  ceFAZolin (ANCEF) 1-5 GM-% IVPB    Comments:  Covington, Jamie   : cabinet override      06/17/15 0924 06/17/15 1222   06/17/15 0924  ceFAZolin (ANCEF) 2-4 GM/100ML-% IVPB    Comments:  Soyla Dryer   : cabinet override      06/17/15 0924 06/17/15 1222   06/04/15 1100  cefTRIAXone  (ROCEPHIN) 2 g in dextrose 5 % 50 mL IVPB     2 g 100 mL/hr over 30 Minutes Intravenous Every 24 hours 06/03/15 1038 06/15/15 1134   06/03/15 1800  cefTAZidime (FORTAZ) 1 g in dextrose 5 % 50 mL IVPB  Status:  Discontinued     1 g 100 mL/hr over 30 Minutes Intravenous Every 24 hours 06/03/15 0737 06/03/15 0956   06/03/15 1100  cefTRIAXone (ROCEPHIN) 1 g in dextrose 5 % 50 mL IVPB  Status:  Discontinued     1 g 100 mL/hr over 30 Minutes Intravenous Every 24 hours 06/03/15 0956 06/03/15 1038   06/02/15 1800  cefTAZidime (FORTAZ) 2 g in dextrose 5 % 50 mL IVPB  Status:  Discontinued     2 g 100 mL/hr over 30 Minutes Intravenous Every 12 hours 06/02/15 1306 06/03/15 0737   05/31/15 1800  cefTAZidime (FORTAZ) 2 g in dextrose 5 % 50 mL IVPB  Status:  Discontinued     2 g 100 mL/hr over 30 Minutes Intravenous Every 48 hours 05/31/15 1444 06/02/15 1306   05/31/15 1130  vancomycin (VANCOCIN) 2,250 mg in sodium chloride 0.9 % 500 mL IVPB  Status:  Discontinued     2,250 mg 250 mL/hr over 120 Minutes Intravenous  Once 05/31/15 1124 05/31/15 1128   05/31/15 1130  vancomycin (VANCOCIN) 2,500 mg in sodium chloride 0.9 % 500 mL IVPB     2,500 mg 250 mL/hr over 120 Minutes Intravenous  Once 05/31/15 1128 05/31/15 1347  05/31/15 1115  piperacillin-tazobactam (ZOSYN) IVPB 3.375 g     3.375 g 100 mL/hr over 30 Minutes Intravenous  Once 05/31/15 1114 05/31/15 1239        Intake/Output Summary (Last 24 hours) at 07/27/15 1821 Last data filed at 07/27/15 1300  Gross per 24 hour  Intake    575 ml  Output    315 ml  Net    260 ml   Filed Weights   07/26/15 0735 07/26/15 1117  Weight: 112.5 kg (248 lb 0.3 oz) 111.5 kg (245 lb 13 oz)    Objective: Physical Exam: Filed Vitals:   07/27/15 1128 07/27/15 1200 07/27/15 1242 07/27/15 1724  BP: 97/64 91/61 107/64 103/68  Pulse: 82 93 96 93  Temp: 98 F (36.7 C) 98 F (36.7 C) 97.6 F (36.4 C) 98.5 F (36.9 C)  TempSrc: Oral Oral Oral Oral  Resp:  21 19 18 17   Height:      Weight:      SpO2: 100% 96% 98% 99%    General: Alert, Awake and Oriented to Time, Place and Person. Appear in moderate distress Eyes: PERRL, Conjunctiva normal ENT: Oral Mucosa clear moist. Neck: difficult to assess JVD, no Abnormal Mass Or lumps Cardiovascular: S1 and S2 Present, aortic systolic Murmur, Respiratory: Bilateral Air entry equal and Decreased, basal Crackles, no wheezes Abdomen: Bowel Sound present, Soft and no tenderness Skin: no redness, no Rash  Extremities: bilateral Pedal edema, no calf tenderness Neurologic: Grossly no focal neuro deficit. Bilaterally Equal motor strength  Data Reviewed: CBC:  Recent Labs Lab 07/21/15 0644 07/23/15 0705 07/25/15 0511 07/26/15 0800 07/27/15 0830  WBC 4.4 5.1 5.0 5.4 5.3  HGB 8.0* 8.1* 8.2* 8.0* 8.4*  HCT 27.0* 27.6* 28.1* 27.5* 29.4*  MCV 91.5 91.1 91.5 92.3 91.3  PLT 177 183 210 207 [id number]   Basic Metabolic Panel:  Recent Labs Lab 07/22/15 0845 07/23/15 0706 07/25/15 0511 07/26/15 0800 07/27/15 0830  NA 136 133* 132* 133* 135  K 3.4* 3.2* 3.9 3.9 3.5  CL 100* 98* 99* 100* 101  CO2 26 26 23 24 26   GLUCOSE 101* 87 76 71 101*  BUN 30* 49* 43* 55* 44*  CREATININE 3.19* 4.01* 3.92* 4.86* 3.49*  CALCIUM 8.0* 7.8* 7.9* 8.1* 7.9*  PHOS 3.0 3.1 3.1 4.2 3.0    Liver Function Tests:  Recent Labs Lab 07/22/15 0845 07/23/15 0706 07/25/15 0511 07/26/15 0800 07/27/15 0830  ALBUMIN 1.6* 1.4* 1.5* 1.6* 1.5*   No results for input(s): LIPASE, AMYLASE in the last 168 hours. No results for input(s): AMMONIA in the last 168 hours. Coagulation Profile:  Recent Labs Lab 07/23/15 0540 07/24/15 0400 07/25/15 0511 07/26/15 0425 07/27/15 0608  INR 2.70* 2.52* 2.17* 2.15* 2.08*   Cardiac Enzymes: No results for input(s): CKTOTAL, CKMB, CKMBINDEX, TROPONINI in the last 168 hours. BNP (last 3 results) No results for input(s): PROBNP in the last 8760 hours.  CBG:  Recent Labs Lab  07/25/15 2048 07/26/15 1725 07/26/15 2101 07/27/15 1232 07/27/15 1721  GLUCAP 115* 134* 133* 69 98    Studies: No results found.   Scheduled Meds: . sodium chloride   Intravenous Once  . antiseptic oral rinse  7 mL Mouth Rinse BID  . calcitRIOL  0.25 mcg Oral Q M,W,F  . darbepoetin (ARANESP) injection - DIALYSIS  200 mcg Intravenous Q Tue-HD  . feeding supplement (NEPRO CARB STEADY)  237 mL Oral Q1500  . feeding supplement (PRO-STAT SUGAR FREE 64)  30 mL Oral  TID WC  . insulin aspart  0-15 Units Subcutaneous TID WC  . insulin aspart  0-5 Units Subcutaneous QHS  . insulin NPH Human  10 Units Subcutaneous BID AC & HS  . LORazepam  1 mg Oral QHS  . midodrine  10 mg Oral TID AC  . multivitamin  1 tablet Oral QHS  . nystatin  5 mL Oral QID  . [START ON 07/28/2015] pantoprazole  40 mg Oral BID AC  . polyethylene glycol  17 g Oral Daily  . warfarin  1 mg Oral ONCE-1800  . Warfarin - Pharmacist Dosing Inpatient   Does not apply q1800   Continuous Infusions: . sodium chloride     PRN Meds: acetaminophen (TYLENOL) oral liquid 160 mg/5 mL, albuterol, benzocaine, guaiFENesin, menthol-cetylpyridinium, ondansetron, oxyCODONE, phenol, RESOURCE THICKENUP CLEAR, sodium chloride flush  Time spent: 30 minutes  Author: Berle Mull, MD [facility] Pager: [phone] 07/27/2015 6:21 PM  If 7PM-7AM, please contact night-coverage at [email or web address], password [facility]

## 2015-07-28 DIAGNOSIS — A414 Sepsis due to anaerobes: Secondary | ICD-10-CM

## 2015-07-28 LAB — CBC WITH DIFFERENTIAL/PLATELET
BASOS PCT: 1 %
Basophils Absolute: 0.1 10*3/uL (ref 0.0–0.1)
EOS PCT: 3 %
Eosinophils Absolute: 0.2 10*3/uL (ref 0.0–0.7)
HEMATOCRIT: 31.8 % — AB (ref 39.0–52.0)
Hemoglobin: 9.5 g/dL — ABNORMAL LOW (ref 13.0–17.0)
LYMPHS ABS: 1 10*3/uL (ref 0.7–4.0)
Lymphocytes Relative: 19 %
MCH: 27.5 pg (ref 26.0–34.0)
MCHC: 29.9 g/dL — AB (ref 30.0–36.0)
MCV: 91.9 fL (ref 78.0–100.0)
MONO ABS: 0.6 10*3/uL (ref 0.1–1.0)
MONOS PCT: 10 %
NEUTROS ABS: 3.6 10*3/uL (ref 1.7–7.7)
Neutrophils Relative %: 67 %
PLATELETS: 224 10*3/uL (ref 150–400)
RBC: 3.46 MIL/uL — ABNORMAL LOW (ref 4.22–5.81)
RDW: 18.2 % — AB (ref 11.5–15.5)
WBC: 5.5 10*3/uL (ref 4.0–10.5)

## 2015-07-28 LAB — COMPREHENSIVE METABOLIC PANEL
ALT: 34 U/L (ref 17–63)
AST: 42 U/L — AB (ref 15–41)
Albumin: 1.5 g/dL — ABNORMAL LOW (ref 3.5–5.0)
Alkaline Phosphatase: 239 U/L — ABNORMAL HIGH (ref 38–126)
Anion gap: 9 (ref 5–15)
BILIRUBIN TOTAL: 0.5 mg/dL (ref 0.3–1.2)
BUN: 30 mg/dL — AB (ref 6–20)
CHLORIDE: 98 mmol/L — AB (ref 101–111)
CO2: 28 mmol/L (ref 22–32)
Calcium: 8.1 mg/dL — ABNORMAL LOW (ref 8.9–10.3)
Creatinine, Ser: 2.84 mg/dL — ABNORMAL HIGH (ref 0.61–1.24)
GFR, EST AFRICAN AMERICAN: 24 mL/min — AB (ref >60)
GFR, EST NON AFRICAN AMERICAN: 21 mL/min — AB (ref >60)
Glucose, Bld: 106 mg/dL — ABNORMAL HIGH (ref 65–99)
POTASSIUM: 3.9 mmol/L (ref 3.5–5.1)
Sodium: 135 mmol/L (ref 135–145)
TOTAL PROTEIN: 4.9 g/dL — AB (ref 6.5–8.1)

## 2015-07-28 LAB — GLUCOSE, CAPILLARY
GLUCOSE-CAPILLARY: 77 mg/dL (ref 65–99)
GLUCOSE-CAPILLARY: 156 mg/dL — AB (ref 65–99)
GLUCOSE-CAPILLARY: 116 mg/dL — AB (ref 65–99)
Glucose-Capillary: 96 mg/dL (ref 65–99)

## 2015-07-28 LAB — MAGNESIUM: MAGNESIUM: 1.8 mg/dL (ref 1.7–2.4)

## 2015-07-28 LAB — PROTIME-INR
INR: 1.76 — ABNORMAL HIGH (ref 0.00–1.49)
Prothrombin Time: 20.5 seconds — ABNORMAL HIGH (ref 11.6–15.2)

## 2015-07-28 LAB — HEPARIN LEVEL (UNFRACTIONATED)

## 2015-07-28 MED ORDER — WARFARIN SODIUM 3 MG PO TABS
3.0000 mg | ORAL_TABLET | Freq: Once | ORAL | Status: AC
  Administered 2015-07-28: 3 mg via ORAL
  Filled 2015-07-28: qty 1

## 2015-07-28 MED ORDER — HEPARIN (PORCINE) IN NACL 100-0.45 UNIT/ML-% IJ SOLN
1650.0000 [IU]/h | INTRAMUSCULAR | Status: DC
  Administered 2015-07-28: 1200 [IU]/h via INTRAVENOUS
  Administered 2015-07-29: 1500 [IU]/h via INTRAVENOUS
  Administered 2015-07-30: 1650 [IU]/h via INTRAVENOUS
  Filled 2015-07-28 (×3): qty 250

## 2015-07-28 NOTE — Progress Notes (Signed)
ANTICOAGULATION CONSULT NOTE - Follow Up Consult  Pharmacy Consult for Warfarin + Heparin  Indication: Atrial fibrillation and hx PE and DVT, new right leg DVT 06/16/15  No Known Allergies  Patient Measurements: Height: 5' 9.5" (176.5 cm) Weight: 240 lb 3.2 oz (108.954 kg) IBW/kg (Calculated) : 71.85 Heparin Dosing Weight: 95.5 kg  Vital Signs: Temp: 98.2 F (36.8 C) (06/22 0508) Temp Source: Oral (06/22 0508) BP: 112/70 mmHg (06/22 0508) Pulse Rate: 94 (06/22 0508)  Labs:  Recent Labs  07/26/15 0425  07/26/15 0800 07/27/15 0608 07/27/15 0830 07/28/15 0553  HGB  --   < > 8.0*  --  8.4* 9.5*  HCT  --   --  27.5*  --  29.4* 31.8*  PLT  --   --  207  --  232 224  LABPROT 23.8*  --   --  23.2*  --  20.5*  INR 2.15*  --   --  2.08*  --  1.76*  CREATININE  --   --  4.86*  --  3.49* 2.84*  < > = values in this interval not displayed.  Estimated Creatinine Clearance: 28.4 mL/min (by C-G formula based on Cr of 2.84).   Assessment: 33 YOM on warfarin PTA for AFib + hx DVT/PE however patient also found to have an acute DVT in R leg (gastric vein and indeterminate age fem and pop veins) per dopplers 5/11. Admitted 4/25 with R peirnephric hematoma/hematuria and BOO of solitary kidney on 4/24. Admit INR 8 on PTA dose. Received several dose of vitamin K between 4/26 and 06/14/15. 5/10 HIT panel neg, SRA neg. Warfarin held 6/15 for AVF placement, done 6/19. Warf resumed 6/20.   INR this morning is SUBtherapeutic (INR 1.76 << 2.08, goal of 2-3) as warfarin has been resumed cautiously given poor po intake and recent trends (INR>2 for 6d with 0.5 mg x 1 dose given). Hgb up to 9.5 s/p 1 unit PRBC on 6/21. Plts 224. No overt s/sx of bleeding noted.   Plans are to start a heparin bridge this morning since the INR is <2 and the patient has had a recent DVT. Hep Wt: 95.5 kg  Goal of Therapy:  INR 2-3 Heparin level 0.3-0.7 units/ml Monitor platelets by anticoagulation protocol: Yes   Plan:   1. Initiate Heparin at 1200 units/hr (12 ml/hr) 2. Warfarin 3 mg x 1 dose at 1800 today 3. Daily PT/INR, HL 4. Will continue to monitor for any signs/symptoms of bleeding and will follow up with heparin level in 8 hours and PT/INR in the AM  Alycia Rossetti, PharmD, BCPS Clinical Pharmacist Pager: [phone] 07/28/2015 12:15 PM

## 2015-07-28 NOTE — Progress Notes (Signed)
Occupational Therapy Treatment Patient Details Name: [name] MRN: [id number] DOB: Jul 04, 1940 Today's Date: 07/28/2015    History of present illness Pt adm with septic shock likely due to UTI. Pt with renal failure and on CVVHD. Pt then with Rt perinephric hematoma that required embolization and pt with VDRF. Cardiac arrest 5/3. Reintubated 5/16. Extubated 5/27. S/p Left Brachial Cephalic AV fistula placement on 6/19; PMH - recent (3/28) rt rotator cuff repair, Lt renal cell carcinoma s/p Lt nephrectomy, prostate cancer s/p XRT, PE in 2008, OSA, HTN, HLD, DM, A fib.   OT comments  Pt motivated but needs cues to perform exercises.  Wife has been encouraging him to reacher across body into horizontal Adduction.  Pt used R as primary hand for brushing teeth without pain today.    Follow Up Recommendations  SNF    Equipment Recommendations   (defer to next venue)    Recommendations for Other Services      Precautions / Restrictions Precautions Precautions: Fall Type of Shoulder Precautions: RTC repiar 04/2015 - per Cay Schillings 6/5 pt can use his RUE however he wants within his pain tolerance. Only self ROM Precaution Comments: Per Dr Titus Mould who called Dr. Gladstone Lighter,  Pt able to weight bear through Rt UE, but no ROM of Rt shoulder. Sent fax to [phone] 6/5 asking for specific ROM guildeline as pt is over 2 months out from repair.  Restrictions Weight Bearing Restrictions: No       Mobility Bed Mobility                  Transfers                      Balance                                   ADL Overall ADL's : Needs assistance/impaired     Grooming: Oral care;Set up;Bed level (HOB raised, using bil UEs, R as primary)                                 General ADL Comments: pt seen at bed level with HOB raised as I did not have second person to assist.  Pt used R as primary hand to brush teeth.  Performed SROM RUE and LUE AROM; he is  s/p A/V fistula placement on this      Vision                     Perception     Praxis      Cognition   Behavior During Therapy: [facility] for tasks assessed/performed Overall Cognitive Status: Within Functional Limits for tasks assessed       Memory: Decreased short-term memory               Extremity/Trunk Assessment               Exercises Other Exercises Other Exercises: pt performed SROM to RUE, FF limited to approximately 30; abduction to 10 degrees.  Pt also bring RUE across body for horizontal Adduction Other Exercises: Performed LUE AROM.  Pt flexion strength 3+/5.    Shoulder Instructions       General Comments      Pertinent Vitals/ Pain       Pain Assessment: No/denies pain  Home Living  Prior Functioning/Environment              Frequency Min 2X/week     Progress Toward Goals  OT Goals(current goals can now be found in the care plan section)  Progress towards OT goals: Progressing toward goals     Plan Discharge plan remains appropriate    Co-evaluation                 End of Session     Activity Tolerance Patient tolerated treatment well   Patient Left in bed;with family/visitor present;with nursing/sitter in room   Nurse Communication  (skin tear R wrist area; another forearm area starting)        Time: [id number] OT Time Calculation (min): 19 min  Charges: OT General Charges $OT Visit: 1 Procedure OT Treatments $Therapeutic Exercise: 8-22 mins  Falcon Mccaskey 07/28/2015, 2:14 PM   Lesle Chris, OTR/L [phone] 07/28/2015

## 2015-07-28 NOTE — Clinical Social Work Note (Addendum)
CSW met with patient this morning and went over SCAT application she completed for patient. Application faxed to [place] office. Call made to [place] office and message left for Leretha Pol [phone]).  CSW contacted by Santiago Glad, Engineer, site at [facility] and they have received insurance ([employer]) authorization for patient to d/c to them for rehab. [name] updated.  [name], MSW, LCSW Licensed Clinical Social Worker [place] [phone]

## 2015-07-28 NOTE — Progress Notes (Signed)
[facility] Progress Note  Patient: [name] [id number]   PCP: Melinda Crutch DOB: 1940/10/02   DOA: 05/31/2015   DOS: 07/28/2015   Date of Service: the patient was seen and examined on 07/28/2015  Subjective: Denies any acute complaint. Mentions he is feeling better. No active bleeding reported. Nutrition: Tolerating oral diet  Brief hospital course: Pt. with PMH of Prostate cancer status post radiation, RCC status post left nephrectomy, sleep apnea, prior DVT, CK-MB stage III, HTN, DM; admitted on 05/31/2015, with complaint of hypotension, was found to have septic shock due to UTI with acute kidney injury. Patient was admitted to ICU. Patient had elevated INR on admission and Klebsiella bacteremia causing septic shock. Treated with IV antibiotics. Patient had a peritoneal right perinephric hematoma which was initially treated conservatively with reversal of the INR. Patient developed cardiac arrest on 06/08/2015 and had successful ROC, patient underwent coil embolization of right bleeding site. Post CPR also developed pneumothorax on left requiring chest tube placement. Extubated on 06/18/2015 and reintubated on 06/21/2015 with extubation again on 07/02/2015. On 07/25/2015 underwent left brachiocephalic AV fistula placement. During the course on 06/23/2015 was found to have DVT and was started on heparin and transitioned to Coumadin. Currently further plan is continue monitoring for dialysis tolerance, BR BPR as well as improvement in oral intake.  Assessment and Plan: 1. ESRD on hemodialysis. New long-term HD Tolerating hemodialysis, on Wednesday the patient was able to tolerate HD in chair. Patient already has a place for HD on Monday Wednesday Friday. Unsure about timing. We will await recommendation from nephrology for HD. Receiving Aranesp, calcitriol, midodrine  2. Anemia of chronic kidney disease. H&H relatively stable. Status post 1 PRBC with HD on 07/27/2015. Appropriate  improvement in H&H from 8.4-9.5 FOBT is positive 1. No evidence of active bleeding reported by patient. Changing PPI to twice a day before meals.  3. Hypoalbuminemia Third spacing with pedal edema. Chronic diastolic CHF. Patient appears mildly volume overloaded. Getting hemodialysis to manage volume. We'll continue to closely monitor the patient's volume status.  4. Severe protein calorie malnutrition. Continue nutritional supplementation.  5. Pressure ulcers. Multiple site different stages. Frequent turning and continue wound care.  6. Type 2 diabetes mellitus. Blood sugars are running low due to poor oral intake. At present I would continue reduced dose NPH 10 units twice a day and continue sliding scale insulin.  7. Poor oral intake. Monitor for improvement after change in the PPI. May need Megace.  8. Atrial fibrillation. Newly Diagnosed DVT  Back on Coumadin for anticoagulation. Rate relatively controlled. We will continue to monitor for evidence of bleeding. While the INR remains subtherapeutic the patient will be on heparin for bridging  9. Chronic indwelling Foley catheter. Chronic hematuria. Currently resolved. We'll continue to monitor.  10. Septic shock (HCC)-currently resolved Klebsiella pneumoniae bacteremia probably from pyelonephritis. Also associated hemorrhagic shock from right perinephric hematoma. Cardiac arrest, followed by pneumothorax requiring chest tube, currently resolved.  Prolonged course of hospitalization due to elbow. Also had cardiac arrest on 5/3. Completed 14 days of antibiotic on 06/15/2015. At present no evidence of acute infection. Patient is at high risk for reinfection due to chronic indwelling Foley catheter.  Pain management: When necessary oxycodone Activity: snf physical therapy Bowel regimen: last BM 07/28/2015 Diet: Renal diet carb modified DVT Prophylaxis: Therapeutic anticoagulation  Advance goals of care discussion:  Full code  Family Communication: family was present at bedside, at the time of interview. The pt provided permission to discuss  medical plan with the family. Opportunity was given to ask question and all questions were answered satisfactorily.   Disposition:  Discharge to SNF. Expected discharge date: 07/29/2015, after hemodialysis, pending arrangement for hemodialysis outpatient  Consultants:  Nephrology [facility]oped[facility] Urology Vascular surgery  Procedures:  AV fistula creation. Hemodialysis Echocardiogram Chest tube insertion Lower extremity Doppler  Antibiotics: Anti-infectives    Start     Dose/Rate Route Frequency Ordered Stop   07/25/15 0600  cefUROXime (ZINACEF) 1.5 g in dextrose 5 % 50 mL IVPB     1.5 g 100 mL/hr over 30 Minutes Intravenous To ShortStay Surgical 07/23/15 0939 07/25/15 1345   06/22/15 2300  vancomycin (VANCOCIN) IVPB 1000 mg/200 mL premix  Status:  Discontinued     1,000 mg 200 mL/hr over 60 Minutes Intravenous Every 24 hours 06/21/15 2118 06/22/15 0921   06/21/15 2300  cefTAZidime (FORTAZ) 2 g in dextrose 5 % 50 mL IVPB  Status:  Discontinued     2 g 100 mL/hr over 30 Minutes Intravenous Every 12 hours 06/21/15 2118 06/22/15 0908   06/21/15 2200  vancomycin (VANCOCIN) 2,000 mg in sodium chloride 0.9 % 500 mL IVPB     2,000 mg 250 mL/hr over 120 Minutes Intravenous  Once 06/21/15 2118 06/22/15 0005   06/17/15 1200  ceFAZolin (ANCEF) 3 g in dextrose 5 % 50 mL IVPB     3 g 130 mL/hr over 30 Minutes Intravenous To Radiology 06/17/15 1156 06/17/15 1251   06/17/15 0924  ceFAZolin (ANCEF) 1-5 GM-% IVPB    Comments:  Covington, Jamie   : cabinet override      06/17/15 0924 06/17/15 1222   06/17/15 0924  ceFAZolin (ANCEF) 2-4 GM/100ML-% IVPB    Comments:  Soyla Dryer   : cabinet override      06/17/15 0924 06/17/15 1222   06/04/15 1100  cefTRIAXone (ROCEPHIN) 2 g in dextrose 5 % 50 mL IVPB     2 g 100 mL/hr over 30 Minutes Intravenous Every 24  hours 06/03/15 1038 06/15/15 1134   06/03/15 1800  cefTAZidime (FORTAZ) 1 g in dextrose 5 % 50 mL IVPB  Status:  Discontinued     1 g 100 mL/hr over 30 Minutes Intravenous Every 24 hours 06/03/15 0737 06/03/15 0956   06/03/15 1100  cefTRIAXone (ROCEPHIN) 1 g in dextrose 5 % 50 mL IVPB  Status:  Discontinued     1 g 100 mL/hr over 30 Minutes Intravenous Every 24 hours 06/03/15 0956 06/03/15 1038   06/02/15 1800  cefTAZidime (FORTAZ) 2 g in dextrose 5 % 50 mL IVPB  Status:  Discontinued     2 g 100 mL/hr over 30 Minutes Intravenous Every 12 hours 06/02/15 1306 06/03/15 0737   05/31/15 1800  cefTAZidime (FORTAZ) 2 g in dextrose 5 % 50 mL IVPB  Status:  Discontinued     2 g 100 mL/hr over 30 Minutes Intravenous Every 48 hours 05/31/15 1444 06/02/15 1306   05/31/15 1130  vancomycin (VANCOCIN) 2,250 mg in sodium chloride 0.9 % 500 mL IVPB  Status:  Discontinued     2,250 mg 250 mL/hr over 120 Minutes Intravenous  Once 05/31/15 1124 05/31/15 1128   05/31/15 1130  vancomycin (VANCOCIN) 2,500 mg in sodium chloride 0.9 % 500 mL IVPB     2,500 mg 250 mL/hr over 120 Minutes Intravenous  Once 05/31/15 1128 05/31/15 1347   05/31/15 1115  piperacillin-tazobactam (ZOSYN) IVPB 3.375 g     3.375 g 100 mL/hr over  30 Minutes Intravenous  Once 05/31/15 1114 05/31/15 1239        Intake/Output Summary (Last 24 hours) at 07/28/15 1743 Last data filed at 07/28/15 1500  Gross per 24 hour  Intake    767 ml  Output    175 ml  Net    592 ml   Filed Weights   07/26/15 0735 07/26/15 1117 07/27/15 1958  Weight: 112.5 kg (248 lb 0.3 oz) 111.5 kg (245 lb 13 oz) 108.954 kg (240 lb 3.2 oz)    Objective: Physical Exam: Filed Vitals:   07/28/15 0900 07/28/15 1459 07/28/15 1515 07/28/15 1639  BP: 105/66   95/69  Pulse: 97  75 87  Temp: 98.3 F (36.8 C)   98.2 F (36.8 C)  TempSrc: Oral   Oral  Resp: 16 20 16 14   Height:      Weight:      SpO2: 100% 98% 98% 96%    General: Alert, Awake and Oriented to  Time, Place and Person. Appear in moderate distress Eyes: PERRL, Conjunctiva normal ENT: Oral Mucosa clear moist. Neck: difficult to assess JVD, no Abnormal Mass Or lumps Cardiovascular: S1 and S2 Present, aortic systolic Murmur, Respiratory: Bilateral Air entry equal and Decreased, basal Crackles, no wheezes Abdomen: Bowel Sound present, Soft and no tenderness Skin: no redness, no Rash  Extremities: bilateral Pedal edema, no calf tenderness Neurologic: Grossly no focal neuro deficit. Bilaterally Equal motor strength  Data Reviewed: CBC:  Recent Labs Lab 07/23/15 0705 07/25/15 0511 07/26/15 0800 07/27/15 0830 07/28/15 0553  WBC 5.1 5.0 5.4 5.3 5.5  NEUTROABS  --   --   --   --  3.6  HGB 8.1* 8.2* 8.0* 8.4* 9.5*  HCT 27.6* 28.1* 27.5* 29.4* 31.8*  MCV 91.1 91.5 92.3 91.3 91.9  PLT 183 210 207 232 [id number]   Basic Metabolic Panel:  Recent Labs Lab 07/22/15 0845 07/23/15 0706 07/25/15 0511 07/26/15 0800 07/27/15 0830 07/28/15 0553  NA 136 133* 132* 133* 135 135  K 3.4* 3.2* 3.9 3.9 3.5 3.9  CL 100* 98* 99* 100* 101 98*  CO2 26 26 23 24 26 28   GLUCOSE 101* 87 76 71 101* 106*  BUN 30* 49* 43* 55* 44* 30*  CREATININE 3.19* 4.01* 3.92* 4.86* 3.49* 2.84*  CALCIUM 8.0* 7.8* 7.9* 8.1* 7.9* 8.1*  MG  --   --   --   --   --  1.8  PHOS 3.0 3.1 3.1 4.2 3.0  --     Liver Function Tests:  Recent Labs Lab 07/23/15 0706 07/25/15 0511 07/26/15 0800 07/27/15 0830 07/28/15 0553  AST  --   --   --   --  42*  ALT  --   --   --   --  34  ALKPHOS  --   --   --   --  239*  BILITOT  --   --   --   --  0.5  PROT  --   --   --   --  4.9*  ALBUMIN 1.4* 1.5* 1.6* 1.5* 1.5*   No results for input(s): LIPASE, AMYLASE in the last 168 hours. No results for input(s): AMMONIA in the last 168 hours. Coagulation Profile:  Recent Labs Lab 07/24/15 0400 07/25/15 0511 07/26/15 0425 07/27/15 0608 07/28/15 0553  INR 2.52* 2.17* 2.15* 2.08* 1.76*   Cardiac Enzymes: No results for  input(s): CKTOTAL, CKMB, CKMBINDEX, TROPONINI in the last 168 hours. BNP (last 3 results) No results  for input(s): PROBNP in the last 8760 hours.  CBG:  Recent Labs Lab 07/27/15 1721 07/27/15 2011 07/28/15 0723 07/28/15 1154 07/28/15 1650  GLUCAP 98 131* 77 156* 96    Studies: No results found.   Scheduled Meds: . sodium chloride   Intravenous Once  . antiseptic oral rinse  7 mL Mouth Rinse BID  . calcitRIOL  0.25 mcg Oral Q M,W,F  . darbepoetin (ARANESP) injection - DIALYSIS  200 mcg Intravenous Q Tue-HD  . feeding supplement (NEPRO CARB STEADY)  237 mL Oral Q1500  . feeding supplement (PRO-STAT SUGAR FREE 64)  30 mL Oral TID WC  . insulin aspart  0-15 Units Subcutaneous TID WC  . insulin aspart  0-5 Units Subcutaneous QHS  . insulin NPH Human  10 Units Subcutaneous BID AC & HS  . LORazepam  1 mg Oral QHS  . midodrine  10 mg Oral TID AC  . multivitamin  1 tablet Oral QHS  . nystatin  5 mL Oral QID  . pantoprazole  40 mg Oral BID AC  . polyethylene glycol  17 g Oral Daily  . warfarin  3 mg Oral ONCE-1800  . Warfarin - Pharmacist Dosing Inpatient   Does not apply q1800   Continuous Infusions: . sodium chloride    . heparin 1,200 Units/hr (07/28/15 1348)   PRN Meds: acetaminophen (TYLENOL) oral liquid 160 mg/5 mL, albuterol, benzocaine, guaiFENesin, menthol-cetylpyridinium, ondansetron, oxyCODONE, phenol, RESOURCE THICKENUP CLEAR, sodium chloride flush  Time spent: 30 minutes  Author: Berle Mull, MD [facility] Pager: [phone] 07/28/2015 5:43 PM  If 7PM-7AM, please contact night-coverage at [email or web address], password [facility]

## 2015-07-28 NOTE — Progress Notes (Signed)
ANTICOAGULATION CONSULT NOTE - Follow Up Consult  Pharmacy Consult for Heparin (while INR is <2) Indication: atrial fibrillation and hx DVT/PE  No Known Allergies  Patient Measurements: Height: 5' 9.5" (176.5 cm) Weight: 242 lb 14.4 oz (110.179 kg) IBW/kg (Calculated) : 71.85  Vital Signs: Temp: 99.7 F (37.6 C) (06/22 1900) Temp Source: Oral (06/22 1900) BP: 131/76 mmHg (06/22 1900) Pulse Rate: 93 (06/22 1900)  Labs:  Recent Labs  07/26/15 0425  07/26/15 0800 07/27/15 0608 07/27/15 0830 07/28/15 0553 07/28/15 2152  HGB  --   < > 8.0*  --  8.4* 9.5*  --   HCT  --   --  27.5*  --  29.4* 31.8*  --   PLT  --   --  207  --  232 224  --   LABPROT 23.8*  --   --  23.2*  --  20.5*  --   INR 2.15*  --   --  2.08*  --  1.76*  --   HEPARINUNFRC  --   --   --   --   --   --  <0.10*  CREATININE  --   --  4.86*  --  3.49* 2.84*  --   < > = values in this interval not displayed.  Estimated Creatinine Clearance: 28.6 mL/min (by C-G formula based on Cr of 2.84).  Assessment: On heparin while INR is sub-therapeutic, initial heparin level is low   Goal of Therapy:  Heparin level 0.3-0.7 units/ml Monitor platelets by anticoagulation protocol: Yes   Plan:  -Increase heparin to 1500 units/hr -0800 HL  Narda Bonds 07/28/2015,11:27 PM

## 2015-07-28 NOTE — Progress Notes (Signed)
Noticed pt's nail beds on both hands are blue tinged and cap refill is sluggish. O2 Sats 98% on RA and other vitals stable. MD notified. Will continue to monitor overnight.

## 2015-07-28 NOTE — Progress Notes (Signed)
Came to assess patient and see readiness for NIV for the night, pt refuse it at this time, stated that he may want to use it later on possibly after midnight. Instructed patient to notify RN to let me know once ready for bed or readiness for NIV. Patient verbalize understanding

## 2015-07-29 ENCOUNTER — Inpatient Hospital Stay (HOSPITAL_COMMUNITY): Payer: BLUE CROSS/BLUE SHIELD

## 2015-07-29 DIAGNOSIS — R0989 Other specified symptoms and signs involving the circulatory and respiratory systems: Secondary | ICD-10-CM

## 2015-07-29 LAB — HEPARIN LEVEL (UNFRACTIONATED)
HEPARIN UNFRACTIONATED: 0.37 [IU]/mL (ref 0.30–0.70)
Heparin Unfractionated: 0.25 IU/mL — ABNORMAL LOW (ref 0.30–0.70)

## 2015-07-29 LAB — MAGNESIUM: MAGNESIUM: 1.7 mg/dL (ref 1.7–2.4)

## 2015-07-29 LAB — BASIC METABOLIC PANEL
ANION GAP: 10 (ref 5–15)
BUN: 47 mg/dL — ABNORMAL HIGH (ref 6–20)
CALCIUM: 7.9 mg/dL — AB (ref 8.9–10.3)
CO2: 22 mmol/L (ref 22–32)
Chloride: 101 mmol/L (ref 101–111)
Creatinine, Ser: 3.39 mg/dL — ABNORMAL HIGH (ref 0.61–1.24)
GFR, EST AFRICAN AMERICAN: 19 mL/min — AB (ref >60)
GFR, EST NON AFRICAN AMERICAN: 17 mL/min — AB (ref >60)
Glucose, Bld: 125 mg/dL — ABNORMAL HIGH (ref 65–99)
Potassium: 3.5 mmol/L (ref 3.5–5.1)
SODIUM: 133 mmol/L — AB (ref 135–145)

## 2015-07-29 LAB — CBC
HCT: 27.8 % — ABNORMAL LOW (ref 39.0–52.0)
Hemoglobin: 8.4 g/dL — ABNORMAL LOW (ref 13.0–17.0)
MCH: 27.2 pg (ref 26.0–34.0)
MCHC: 30.2 g/dL (ref 30.0–36.0)
MCV: 90 fL (ref 78.0–100.0)
PLATELETS: 240 10*3/uL (ref 150–400)
RBC: 3.09 MIL/uL — ABNORMAL LOW (ref 4.22–5.81)
RDW: 18 % — AB (ref 11.5–15.5)
WBC: 6.8 10*3/uL (ref 4.0–10.5)

## 2015-07-29 LAB — PROTIME-INR
INR: 1.95 — AB (ref 0.00–1.49)
Prothrombin Time: 22.1 seconds — ABNORMAL HIGH (ref 11.6–15.2)

## 2015-07-29 LAB — GLUCOSE, CAPILLARY
GLUCOSE-CAPILLARY: 110 mg/dL — AB (ref 65–99)
GLUCOSE-CAPILLARY: 78 mg/dL (ref 65–99)
GLUCOSE-CAPILLARY: 97 mg/dL (ref 65–99)
Glucose-Capillary: 141 mg/dL — ABNORMAL HIGH (ref 65–99)

## 2015-07-29 MED ORDER — HEPARIN SODIUM (PORCINE) 1000 UNIT/ML DIALYSIS: 20.0000 [IU]/kg | INTRAMUSCULAR | Status: DC | PRN

## 2015-07-29 MED ORDER — GUAIFENESIN ER 600 MG PO TB12
600.0000 mg | ORAL_TABLET | Freq: Two times a day (BID) | ORAL | Status: DC
  Administered 2015-07-29 – 2015-07-31 (×5): 600 mg via ORAL
  Filled 2015-07-29 (×5): qty 1

## 2015-07-29 MED ORDER — WARFARIN SODIUM 3 MG PO TABS
3.0000 mg | ORAL_TABLET | Freq: Once | ORAL | Status: AC
  Administered 2015-07-29: 3 mg via ORAL
  Filled 2015-07-29: qty 1

## 2015-07-29 MED ORDER — PENTAFLUOROPROP-TETRAFLUOROETH EX AERO: 1.0000 "application " | INHALATION_SPRAY | CUTANEOUS | Status: DC | PRN

## 2015-07-29 MED ORDER — SODIUM CHLORIDE 0.9 % IV SOLN: 100.0000 mL | INTRAVENOUS | Status: DC | PRN

## 2015-07-29 MED ORDER — HEPARIN SODIUM (PORCINE) 1000 UNIT/ML DIALYSIS: 1000.0000 [IU] | INTRAMUSCULAR | Status: DC | PRN

## 2015-07-29 MED ORDER — LIDOCAINE-PRILOCAINE 2.5-2.5 % EX CREA: 1.0000 "application " | TOPICAL_CREAM | CUTANEOUS | Status: DC | PRN

## 2015-07-29 MED ORDER — LIDOCAINE HCL (PF) 1 % IJ SOLN: 5.0000 mL | INTRAMUSCULAR | Status: DC | PRN

## 2015-07-29 MED ORDER — ALTEPLASE 2 MG IJ SOLR: 2.0000 mg | Freq: Once | INTRAMUSCULAR | Status: DC | PRN

## 2015-07-29 NOTE — Clinical Social Work Placement (Addendum)
   CLINICAL SOCIAL WORK PLACEMENT  NOTE 6/   /17 - DISCHARGE TO [facility] **SEE ADDITIONAL COMMENTS BELOW  Date:  07/29/2015  Patient Details  Name: [name] MRN: [id number] Date of Birth: 01-24-1941  Clinical Social Work is seeking post-discharge placement for this patient at the [place] level of care (*CSW will initial, date and re-position this form in  chart as items are completed):  Yes   Patient/family provided with [place] Work Department's list of facilities offering this level of care within the geographic area requested by the patient (or if unable, by the patient's family).  Yes   Patient/family informed of their freedom to choose among providers that offer the needed level of care, that participate in Medicare, Medicaid or managed care program needed by the patient, have an available bed and are willing to accept the patient.  Yes   Patient/family informed of [facility]'s ownership interest in [facility] and [facility], as well as of the fact that they are under no obligation to receive care at these facilities.  PASRR submitted to EDS on 07/11/15     PASRR number received on 07/11/15     Existing PASRR number confirmed on       FL2 transmitted to all facilities in geographic area requested by pt/family on 07/11/15     FL2 transmitted to all facilities within larger geographic area on       Patient informed that his/her managed care company has contracts with or will negotiate with certain facilities, including the following:         Yes - Patient/family informed of bed offers received.  Patient chooses bed at  [facility]     Physician recommends and patient chooses bed at      Patient to be transferred to  [facility] on  .  Patient to be transferred to facility by  ambulance     Patient family notified on   of transfer.  Name of family member notified:        PHYSICIAN       Additional Comment:  07/28/15 - Per Santiago Glad at [facility], received insurance authorization. 07/29/15 - Per MD - patient should be ready for discharge over weekend.   _______________________________________________ Sable Feil, LCSW 07/29/2015, 11:07 AM

## 2015-07-29 NOTE — Care Management Note (Signed)
Case Management Note  Patient Details  Name: [name] MRN: [id number] Date of Birth: 04-18-1940  Subjective/Objective:    CM following for progression and d/c planning.                 Action/Plan: 07/29/2015 No HH or DME needs at this time as pt will d/c to SNF, CSW, [facility] working with pt and wife re transportation to HD at [facility] and placement at [facility].   Expected Discharge Date:      07/30/2015            Expected Discharge Plan:  Skilled Nursing Facility  In-House Referral:  Clinical Social Work  Discharge planning Services  CM Consult  Post Acute Care Choice:  NA Choice offered to:  NA  DME Arranged:  N/A DME Agency:  NA  HH Arranged:  NA HH Agency:  NA  Status of Service:  Completed, signed off  If discussed at [employer] of Stay Meetings, dates discussed:    Additional Comments:  Adron Bene, RN 07/29/2015, 3:21 PM

## 2015-07-29 NOTE — Progress Notes (Signed)
ANTICOAGULATION CONSULT NOTE - Follow Up Consult  Pharmacy Consult for Heparin Indication: Atrial fibrillation and hx PE and DVT, new right leg DVT 06/16/15  No Known Allergies  Patient Measurements: Height: 5' 9.5" (176.5 cm) Weight:  (to be weight on unit, up in chair) IBW/kg (Calculated) : 71.85 Heparin Dosing Weight: 95.5 kg  Vital Signs: Temp: 98.4 F (36.9 C) (06/23 1952) Temp Source: Oral (06/23 1952) BP: 98/65 mmHg (06/23 1952) Pulse Rate: 81 (06/23 1952)  Labs:  Recent Labs  07/27/15 0608  07/27/15 0830 07/28/15 0553 07/28/15 2152 07/29/15 0303 07/29/15 1045 07/29/15 1906  HGB  --   < > 8.4* 9.5*  --  8.4*  --   --   HCT  --   --  29.4* 31.8*  --  27.8*  --   --   PLT  --   --  232 224  --  240  --   --   LABPROT 23.2*  --   --  20.5*  --  22.1*  --   --   INR 2.08*  --   --  1.76*  --  1.95*  --   --   HEPARINUNFRC  --   --   --   --  <0.10*  --  0.37 0.25*  CREATININE  --   --  3.49* 2.84*  --  3.39*  --   --   < > = values in this interval not displayed.  Estimated Creatinine Clearance: 23.9 mL/min (by C-G formula based on Cr of 3.39).   Assessment: 30 YOM on warfarin PTA for AFib + hx DVT/PE however patient also found to have an acute DVT in R leg (gastric vein and indeterminate age fem and pop veins) per dopplers 5/11. Admitted 4/25 with R peirnephric hematoma/hematuria and BOO of solitary kidney on 4/24. Admit INR 8 on PTA dose. Received several dose of vitamin K between 4/26 and 06/14/15. 5/10 HIT panel neg, SRA neg. Warfarin held 6/15 for AVF placement, done 6/19. Warf resumed 6/20.   Continues on heparin bridge with HL of 0.25 this evening  Goal of Therapy:  INR 2-3 Heparin level 0.3-0.7 units/ml Monitor platelets by anticoagulation protocol: Yes   Plan:  Increase heparin to 1650 units / hr Follow up AM level  Thank you Anette Guarneri, PharmD [phone] 07/29/2015 8:10 PM

## 2015-07-29 NOTE — Progress Notes (Signed)
PT Cancellation Note  Patient Details Name: [name] MRN: [id number] DOB: 03-13-1940   Cancelled Treatment:    Reason Eval/Treat Not Completed: Patient at procedure or test/unavailable.  Will try again later as time and pt willingness allow.   [name] 07/29/2015, 8:42 AM    Mee Hives, PT MS Acute Rehab Dept. Number: [place] and [place]

## 2015-07-29 NOTE — Progress Notes (Signed)
ANTICOAGULATION CONSULT NOTE - Follow Up Consult  Pharmacy Consult for Warfarin + Heparin  Indication: Atrial fibrillation and hx PE and DVT, new right leg DVT 06/16/15  No Known Allergies  Patient Measurements: Height: 5' 9.5" (176.5 cm) Weight:  (patient brought to unit in gerichair, unable to stand) IBW/kg (Calculated) : 71.85 Heparin Dosing Weight: 95.5 kg  Vital Signs: Temp: 98.4 F (36.9 C) (06/23 0834) Temp Source: Oral (06/23 0834) BP: 104/60 mmHg (06/23 1030) Pulse Rate: 82 (06/23 1030)  Labs:  Recent Labs  07/27/15 0608  07/27/15 0830 07/28/15 0553 07/28/15 2152 07/29/15 0303 07/29/15 1045  HGB  --   < > 8.4* 9.5*  --  8.4*  --   HCT  --   --  29.4* 31.8*  --  27.8*  --   PLT  --   --  232 224  --  240  --   LABPROT 23.2*  --   --  20.5*  --  22.1*  --   INR 2.08*  --   --  1.76*  --  1.95*  --   HEPARINUNFRC  --   --   --   --  <0.10*  --  0.37  CREATININE  --   --  3.49* 2.84*  --  3.39*  --   < > = values in this interval not displayed.  Estimated Creatinine Clearance: 23.9 mL/min (by C-G formula based on Cr of 3.39).   Assessment: 10 YOM on warfarin PTA for AFib + hx DVT/PE however patient also found to have an acute DVT in R leg (gastric vein and indeterminate age fem and pop veins) per dopplers 5/11. Admitted 4/25 with R peirnephric hematoma/hematuria and BOO of solitary kidney on 4/24. Admit INR 8 on PTA dose. Received several dose of vitamin K between 4/26 and 06/14/15. 5/10 HIT panel neg, SRA neg. Warfarin held 6/15 for AVF placement, done 6/19. Warf resumed 6/20.   Heparin level this morning is therapeutic (HL 0.37, goal of 0.3-0.7). INR this morning is barely SUBtherapeutic (INR 1.95 << 1.76, goal of 2-3) as warfarin has been resumed cautiously given poor po intake and recent trends (INR>2 for 6d with 0.5 mg x 1 dose given). Hgb 8.4 << 9.5 (pt received 1 unit PRBC on 6/21). Plts wnl. No overt s/sx of bleeding noted.   Goal of Therapy:  INR  2-3 Heparin level 0.3-0.7 units/ml Monitor platelets by anticoagulation protocol: Yes   Plan:  1. Continue Heparin at 1500 units/hr (15 ml/hr) 2. Warfarin 3 mg x 1 dose at 1800 today 3. Daily PT/INR, HL 4. Will continue to monitor for any signs/symptoms of bleeding and will follow up with heparin level in 8 hours and PT/INR in the AM  Alycia Rossetti, PharmD, BCPS Clinical Pharmacist Pager: [phone] 07/29/2015 11:34 AM

## 2015-07-29 NOTE — Progress Notes (Addendum)
*  PRELIMINARY RESULTS* Vascular Ultrasound Upper Extremity Arterial Duplex has been completed.  Preliminary findings: No evidence of stenosis or occlusion. Triphasic arterial flow/ within normal limits bilaterally.  Landry Mellow, RDMS, RVT  07/29/2015, 3:00 PM

## 2015-07-29 NOTE — Procedures (Signed)
[facility] HD, sitting in recliner, transferred via [place]. [place] C

## 2015-07-29 NOTE — Progress Notes (Signed)
[facility] Progress Note  Patient: [name] [name]   PCP: Melinda Crutch DOB: [birth year]   DOA: 05/31/2015   DOS: 07/29/2015   Date of Service: the patient was seen and examined on 07/29/2015  Subjective: Patient denies any chest pain or abdominal pain. No cough or shortness of breath. Oral intake continues to remain minimal. I don't have any major issues with hemodialysis. Nutrition: Tolerating oral diet  Brief hospital course: Pt. with PMH of Prostate cancer status post radiation, RCC status post left nephrectomy, sleep apnea, prior DVT, CK-MB stage III, HTN, DM; admitted on 05/31/2015, with complaint of hypotension, was found to have septic shock due to UTI with acute kidney injury. Patient was admitted to ICU. Patient had elevated INR on admission and Klebsiella bacteremia causing septic shock. Treated with IV antibiotics. Patient had a peritoneal right perinephric hematoma which was initially treated conservatively with reversal of the INR. Patient developed cardiac arrest on 06/08/2015 and had successful ROC, patient underwent coil embolization of right bleeding site. Post CPR also developed pneumothorax on left requiring chest tube placement. Extubated on 06/18/2015 and reintubated on 06/21/2015 with extubation again on 07/02/2015. On 07/25/2015 underwent left brachiocephalic AV fistula placement. During the course on 06/23/2015 was found to have DVT and was started on heparin and transitioned to Coumadin. Currently further plan is continue monitoring for dialysis tolerance, BR BPR as well as improvement in oral intake.  Assessment and Plan: 1. ESRD on hemodialysis. New long-term HD Tolerating hemodialysis, on Wednesday the patient was able to tolerate HD in chair. Patient already has a place for HD on Monday Wednesday Friday. Unsure about timing. We will await recommendation from nephrology for HD. Receiving Aranesp, calcitriol, midodrine  patient has a Foley catheter we will  discuss with nephrology regarding the utility of this.  2. Anemia of chronic kidney disease. H&H relatively stable. Status post 1 PRBC with HD on 07/27/2015. Appropriate improvement in H&H from 8.4-9.5 FOBT is positive 1. No evidence of active bleeding reported by patient. Continue PPI to twice a day before meals.  3. Hypoalbuminemia Third spacing with pedal edema. Chronic diastolic CHF. Patient appears mildly volume overloaded. Getting hemodialysis to manage volume. We'll continue to closely monitor the patient's volume status.  4. Severe protein calorie malnutrition. Continue nutritional supplementation.  5. Pressure ulcers. Multiple site different stages. Frequent turning and continue wound care.  6. Type 2 diabetes mellitus. At present I would continue reduced dose NPH 10 units twice a day and continue sliding scale insulin.  7. Poor oral intake. Improving at present May need Megace.  8. Atrial fibrillation. Newly Diagnosed DVT Back on Coumadin for anticoagulation. Rate subtherapeutic While the INR remains subtherapeutic the patient will be on heparin for bridging We will continue to monitor for evidence of bleeding.  9. Chronic indwelling Foley catheter. Chronic hematuria. Currently resolved. We'll continue to monitor.  10. Septic shock (HCC)-currently resolved Klebsiella pneumoniae bacteremia probably from pyelonephritis. Also associated hemorrhagic shock from right perinephric hematoma. Cardiac arrest, followed by pneumothorax requiring chest tube, currently resolved.  Prolonged course of hospitalization due to elbow. Also had cardiac arrest on 5/3. Completed 14 days of antibiotic on 06/15/2015. At present no evidence of acute infection. Patient is at high risk for reinfection due to chronic indwelling Foley catheter.  11. Bruise discoloration of the upper extremity fingers. Vascular ultrasound does not show any evidence of stenosis. Triphasic blood  flow.  Pain management: When necessary oxycodone Activity: snf physical therapy Bowel regimen: last BM 07/28/2015 Diet:  Renal diet carb modified DVT Prophylaxis: Therapeutic anticoagulation  Advance goals of care discussion: Full code  Family Communication: family was present at bedside, at the time of interview. The pt provided permission to discuss medical plan with the family. Opportunity was given to ask question and all questions were answered satisfactorily.   Disposition:  Discharge to SNF. Expected discharge date: 07/31/2015, pending therapeutic INR.  Consultants:  Nephrology [facility] Vascular surgery  Procedures:  AV fistula creation. Hemodialysis Echocardiogram Chest tube insertion Lower extremity Doppler  Antibiotics: Anti-infectives    Start     Dose/Rate Route Frequency Ordered Stop   07/25/15 0600  cefUROXime (ZINACEF) 1.5 g in dextrose 5 % 50 mL IVPB     1.5 g 100 mL/hr over 30 Minutes Intravenous To ShortStay Surgical 07/23/15 0939 07/25/15 1345   06/22/15 2300  vancomycin (VANCOCIN) IVPB 1000 mg/200 mL premix  Status:  Discontinued     1,000 mg 200 mL/hr over 60 Minutes Intravenous Every 24 hours 06/21/15 2118 06/22/15 0921   06/21/15 2300  cefTAZidime (FORTAZ) 2 g in dextrose 5 % 50 mL IVPB  Status:  Discontinued     2 g 100 mL/hr over 30 Minutes Intravenous Every 12 hours 06/21/15 2118 06/22/15 0908   06/21/15 2200  vancomycin (VANCOCIN) 2,000 mg in sodium chloride 0.9 % 500 mL IVPB     2,000 mg 250 mL/hr over 120 Minutes Intravenous  Once 06/21/15 2118 06/22/15 0005   06/17/15 1200  ceFAZolin (ANCEF) 3 g in dextrose 5 % 50 mL IVPB     3 g 130 mL/hr over 30 Minutes Intravenous To Radiology 06/17/15 1156 06/17/15 1251   06/17/15 0924  ceFAZolin (ANCEF) 1-5 GM-% IVPB    Comments:  Covington, Jamie   : cabinet override      06/17/15 0924 06/17/15 1222   06/17/15 0924  ceFAZolin (ANCEF) 2-4 GM/100ML-% IVPB    Comments:  Soyla Dryer   : cabinet override      06/17/15 0924 06/17/15 1222   06/04/15 1100  cefTRIAXone (ROCEPHIN) 2 g in dextrose 5 % 50 mL IVPB     2 g 100 mL/hr over 30 Minutes Intravenous Every 24 hours 06/03/15 1038 06/15/15 1134   06/03/15 1800  cefTAZidime (FORTAZ) 1 g in dextrose 5 % 50 mL IVPB  Status:  Discontinued     1 g 100 mL/hr over 30 Minutes Intravenous Every 24 hours 06/03/15 0737 06/03/15 0956   06/03/15 1100  cefTRIAXone (ROCEPHIN) 1 g in dextrose 5 % 50 mL IVPB  Status:  Discontinued     1 g 100 mL/hr over 30 Minutes Intravenous Every 24 hours 06/03/15 0956 06/03/15 1038   06/02/15 1800  cefTAZidime (FORTAZ) 2 g in dextrose 5 % 50 mL IVPB  Status:  Discontinued     2 g 100 mL/hr over 30 Minutes Intravenous Every 12 hours 06/02/15 1306 06/03/15 0737   05/31/15 1800  cefTAZidime (FORTAZ) 2 g in dextrose 5 % 50 mL IVPB  Status:  Discontinued     2 g 100 mL/hr over 30 Minutes Intravenous Every 48 hours 05/31/15 1444 06/02/15 1306   05/31/15 1130  vancomycin (VANCOCIN) 2,250 mg in sodium chloride 0.9 % 500 mL IVPB  Status:  Discontinued     2,250 mg 250 mL/hr over 120 Minutes Intravenous  Once 05/31/15 1124 05/31/15 1128   05/31/15 1130  vancomycin (VANCOCIN) 2,500 mg in sodium chloride 0.9 % 500 mL IVPB     2,500 mg  250 mL/hr over 120 Minutes Intravenous  Once 05/31/15 1128 05/31/15 1347   05/31/15 1115  piperacillin-tazobactam (ZOSYN) IVPB 3.375 g     3.375 g 100 mL/hr over 30 Minutes Intravenous  Once 05/31/15 1114 05/31/15 1239        Intake/Output Summary (Last 24 hours) at 07/29/15 1842 Last data filed at 07/29/15 1821  Gross per 24 hour  Intake    360 ml  Output   1750 ml  Net  -1390 ml   Filed Weights   07/27/15 1958 07/28/15 1900  Weight: 108.954 kg (240 lb 3.2 oz) 110.179 kg (242 lb 14.4 oz)    Objective: Physical Exam: Filed Vitals:   07/29/15 1130 07/29/15 1210 07/29/15 1820 07/29/15 1827  BP: 103/66 103/64 112/75   Pulse: 78 84 79   Temp:  98.1 F (36.7  C) 99.2 F (37.3 C)   TempSrc:  Oral Oral   Resp:  18 18   Height:      Weight:      SpO2:  99% 98% 96%   General: Alert, Awake and Oriented to Time, Place and Person. Appear in moderate distress Eyes: PERRL, Conjunctiva normal ENT: Oral Mucosa clear moist. Neck: difficult to assess JVD, no Abnormal Mass Or lumps Cardiovascular: S1 and S2 Present, aortic systolic Murmur, Respiratory: Bilateral Air entry equal and Decreased, basal Crackles, no wheezes Abdomen: Bowel Sound present, Soft and no tenderness Skin: no redness, no Rash  Extremities: bilateral Pedal edema, no calf tenderness Neurologic: Grossly no focal neuro deficit. Bilaterally Equal motor strength  Data Reviewed: CBC:  Recent Labs Lab 07/25/15 0511 07/26/15 0800 07/27/15 0830 07/28/15 0553 07/29/15 0303  WBC 5.0 5.4 5.3 5.5 6.8  NEUTROABS  --   --   --  3.6  --   HGB 8.2* 8.0* 8.4* 9.5* 8.4*  HCT 28.1* 27.5* 29.4* 31.8* 27.8*  MCV 91.5 92.3 91.3 91.9 90.0  PLT 210 207 232 224 [id number]   Basic Metabolic Panel:  Recent Labs Lab 07/23/15 0706 07/25/15 0511 07/26/15 0800 07/27/15 0830 07/28/15 0553 07/29/15 0303  NA 133* 132* 133* 135 135 133*  K 3.2* 3.9 3.9 3.5 3.9 3.5  CL 98* 99* 100* 101 98* 101  CO2 26 23 24 26 28 22   GLUCOSE 87 76 71 101* 106* 125*  BUN 49* 43* 55* 44* 30* 47*  CREATININE 4.01* 3.92* 4.86* 3.49* 2.84* 3.39*  CALCIUM 7.8* 7.9* 8.1* 7.9* 8.1* 7.9*  MG  --   --   --   --  1.8 1.7  PHOS 3.1 3.1 4.2 3.0  --   --     Liver Function Tests:  Recent Labs Lab 07/23/15 0706 07/25/15 0511 07/26/15 0800 07/27/15 0830 07/28/15 0553  AST  --   --   --   --  42*  ALT  --   --   --   --  34  ALKPHOS  --   --   --   --  239*  BILITOT  --   --   --   --  0.5  PROT  --   --   --   --  4.9*  ALBUMIN 1.4* 1.5* 1.6* 1.5* 1.5*   No results for input(s): LIPASE, AMYLASE in the last 168 hours. No results for input(s): AMMONIA in the last 168 hours. Coagulation Profile:  Recent Labs Lab  07/25/15 0511 07/26/15 0425 07/27/15 0608 07/28/15 0553 07/29/15 0303  INR 2.17* 2.15* 2.08* 1.76* 1.95*   Cardiac Enzymes:  No results for input(s): CKTOTAL, CKMB, CKMBINDEX, TROPONINI in the last 168 hours. BNP (last 3 results) No results for input(s): PROBNP in the last 8760 hours.  CBG:  Recent Labs Lab 07/28/15 1650 07/28/15 2012 07/29/15 0734 07/29/15 1353 07/29/15 1806  GLUCAP 96 116* 110* 78 141*    Studies: No results found.   Scheduled Meds: . sodium chloride   Intravenous Once  . antiseptic oral rinse  7 mL Mouth Rinse BID  . calcitRIOL  0.25 mcg Oral Q M,W,F  . darbepoetin (ARANESP) injection - DIALYSIS  200 mcg Intravenous Q Tue-HD  . feeding supplement (NEPRO CARB STEADY)  237 mL Oral Q1500  . feeding supplement (PRO-STAT SUGAR FREE 64)  30 mL Oral TID WC  . guaiFENesin  600 mg Oral BID  . insulin aspart  0-15 Units Subcutaneous TID WC  . insulin aspart  0-5 Units Subcutaneous QHS  . insulin NPH Human  10 Units Subcutaneous BID AC & HS  . LORazepam  1 mg Oral QHS  . midodrine  10 mg Oral TID AC  . multivitamin  1 tablet Oral QHS  . nystatin  5 mL Oral QID  . pantoprazole  40 mg Oral BID AC  . Warfarin - Pharmacist Dosing Inpatient   Does not apply q1800   Continuous Infusions: . sodium chloride    . heparin 1,500 Units/hr (07/29/15 0641)   PRN Meds: sodium chloride, sodium chloride, acetaminophen (TYLENOL) oral liquid 160 mg/5 mL, albuterol, alteplase, benzocaine, guaiFENesin, heparin, heparin, lidocaine (PF), lidocaine-prilocaine, menthol-cetylpyridinium, ondansetron, oxyCODONE, pentafluoroprop-tetrafluoroeth, phenol, RESOURCE THICKENUP CLEAR, sodium chloride flush  Time spent: 30 minutes  Author: Berle Mull, MD [facility] Pager: [phone] 07/29/2015 6:42 PM  If 7PM-7AM, please contact night-coverage at [email or web address], password [facility]

## 2015-07-29 NOTE — Progress Notes (Signed)
Occupational Therapy Treatment Patient Details Name: [name] MRN: [id number] DOB: August 03, 1940 Today's Date: 07/29/2015    History of present illness Pt adm with septic shock likely due to UTI. Pt with renal failure and on CVVHD. Pt then with Rt perinephric hematoma that required embolization and pt with VDRF. Cardiac arrest 5/3. Reintubated 5/16. Extubated 5/27. S/p Left Brachial Cephalic AV fistula placement on 6/19; PMH - recent (3/28) rt rotator cuff repair, Lt renal cell carcinoma s/p Lt nephrectomy, prostate cancer s/p XRT, PE in 2008, OSA, HTN, HLD, DM, A fib.   OT comments  Encouraged pt to participate. Pt states he was tired from the day, but completed bed level exercises and attempts at core strengthening. . Wife present and instructed on importance of exercise - wife states "he will do it for you but not for me". Encouraged pt to get OOB to chair on non-dialysis days. Will continue to follow acutely.   Follow Up Recommendations  SNF    Equipment Recommendations  Other (comment) (TBA at SNF)    Recommendations for Other Services  Recommend nsg get pt OOB on non dialysis days with use of maximove.     Precautions / Restrictions Precautions Precautions: Fall Type of Shoulder Precautions: RTC repiar 04/2015 - per Cay Schillings 6/5 pt can use his RUE however he wants within his pain tolerance. Only self ROM Restrictions Weight Bearing Restrictions: No       Mobility Bed Mobility Overal bed mobility: Needs Assistance   Rolling: Max assist;+2 for physical assistance         General bed mobility comments: workign on rolling by pushing through LLE and reaching forward with LUE toward rail.   Transfers                      Balance                                   ADL Overall ADL's : Needs assistance/impaired Eating/Feeding: Set up                                   Functional mobility during ADLs: Maximal assistance         Vision                     Perception     Praxis      Cognition   Behavior During Therapy: Flat affect Overall Cognitive Status: Within Functional Limits for tasks assessed                       Extremity/Trunk Assessment   RUE with apparent atrophy . Question if humeral head is anteriorly displaced, however, pt does not report pain. Pt unable to actively FF R UE. Able to lift R hand off belly into ER.  Encouraged pt to keep pillow under R elbow to support arm and to help increase the functional use of the R shoulder.             Exercises General Exercises - Upper Extremity Shoulder Flexion: Strengthening;Left;15 reps;Supine Shoulder Extension: Strengthening;Left;15 reps;Supine Shoulder ABduction: Strengthening;Left;15 reps;Supine Shoulder ADduction: Strengthening;Left;15 reps;Supine Shoulder Horizontal ADduction: Strengthening;Both;15 reps;Supine Elbow Flexion: AROM;Strengthening;Right;Left;15 reps;Seated Elbow Extension: Strengthening;Right;Left;15 reps;Seated Other Exercises Other Exercises: Bridging BLE  - pace and hold Other Exercises: RUE assited self ROM  Other Exercises: B scapular elevation/depression and retraction   Shoulder Instructions       General Comments      Pertinent Vitals/ Pain       Pain Assessment: Faces Faces Pain Scale: Hurts even more Pain Location: all over Pain Descriptors / Indicators: Grimacing;Discomfort Pain Intervention(s): Limited activity within patient's tolerance;Repositioned  Home Living                                          Prior Functioning/Environment              Frequency Min 2X/week     Progress Toward Goals  OT Goals(current goals can now be found in the care plan section)  Progress towards OT goals: OT to reassess next treatment  Acute Rehab OT Goals Patient Stated Goal: get stronger and return home OT Goal Formulation: With patient Time For Goal Achievement:  08/05/15 Potential to Achieve Goals: Good ADL Goals Pt Will Perform Grooming: with supervision;with set-up;sitting Pt Will Perform Upper Body Bathing: with set-up;with supervision;sitting Pt Will Transfer to Toilet: with +2 assist;with min assist;bedside commode;stand pivot transfer Pt/caregiver will Perform Home Exercise Program: Increased ROM;Increased strength;Right Upper extremity;With Supervision;With written HEP provided  Plan Discharge plan remains appropriate    Co-evaluation                 End of Session     Activity Tolerance Patient limited by fatigue   Patient Left in bed;with call bell/phone within reach;with family/visitor present   Nurse Communication Mobility status        Time: [id number] OT Time Calculation (min): 30 min  Charges: OT General Charges $OT Visit: 1 Procedure OT Treatments $Therapeutic Activity: 23-37 mins  Yanelly Cantrelle,HILLARY 07/29/2015, 5:11 PM   [facility], OTR/L  [phone] 07/29/2015

## 2015-07-29 NOTE — Progress Notes (Signed)
RT note: RT instructed patient with flutter valve use. Patient had good understanding and effort.

## 2015-07-30 LAB — HEPARIN LEVEL (UNFRACTIONATED): HEPARIN UNFRACTIONATED: 0.39 [IU]/mL (ref 0.30–0.70)

## 2015-07-30 LAB — CBC
HEMATOCRIT: 30.4 % — AB (ref 39.0–52.0)
HEMOGLOBIN: 9.1 g/dL — AB (ref 13.0–17.0)
MCH: 26.9 pg (ref 26.0–34.0)
MCHC: 29.9 g/dL — ABNORMAL LOW (ref 30.0–36.0)
MCV: 89.9 fL (ref 78.0–100.0)
Platelets: 214 10*3/uL (ref 150–400)
RBC: 3.38 MIL/uL — AB (ref 4.22–5.81)
RDW: 18 % — AB (ref 11.5–15.5)
WBC: 6.9 10*3/uL (ref 4.0–10.5)

## 2015-07-30 LAB — GLUCOSE, CAPILLARY
GLUCOSE-CAPILLARY: 87 mg/dL (ref 65–99)
GLUCOSE-CAPILLARY: 115 mg/dL — AB (ref 65–99)
GLUCOSE-CAPILLARY: 106 mg/dL — AB (ref 65–99)
Glucose-Capillary: 74 mg/dL (ref 65–99)

## 2015-07-30 LAB — PROTIME-INR
INR: 1.93 — ABNORMAL HIGH (ref 0.00–1.49)
Prothrombin Time: 21.9 seconds — ABNORMAL HIGH (ref 11.6–15.2)

## 2015-07-30 MED ORDER — ENOXAPARIN SODIUM 150 MG/ML ~~LOC~~ SOLN
165.0000 mg | SUBCUTANEOUS | Status: DC
  Administered 2015-07-30 – 2015-07-31 (×2): 165 mg via SUBCUTANEOUS
  Filled 2015-07-30 (×2): qty 1.1

## 2015-07-30 MED ORDER — WARFARIN SODIUM 3 MG PO TABS
3.0000 mg | ORAL_TABLET | Freq: Once | ORAL | Status: AC
  Administered 2015-07-30: 3 mg via ORAL
  Filled 2015-07-30: qty 1

## 2015-07-30 MED ORDER — OXYMETAZOLINE HCL 0.05 % NA SOLN
2.0000 | Freq: Three times a day (TID) | NASAL | Status: DC
  Filled 2015-07-30: qty 15

## 2015-07-30 NOTE — Progress Notes (Signed)
[facility] Progress Note  Patient: [name] [name]   PCP: Melinda Crutch DOB: May 09, 1940   DOA: 05/31/2015   DOS: 07/30/2015   Date of Service: the patient was seen and examined on 07/30/2015  Subjective: Patient was sleepy at the time of my evaluation. Patient's wife mentioned that patient did not have any acute complaint. No nausea no vomiting. Oral intake is improving. Patient's voice is also improving after initiation of the flutter device. Patient is requesting to get the Foley catheter change. Nutrition: Tolerating oral diet  Brief hospital course: Pt. with PMH of Prostate cancer status post radiation, RCC status post left nephrectomy, sleep apnea, prior DVT, CK-MB stage III, HTN, DM; admitted on 05/31/2015, with complaint of hypotension, was found to have septic shock due to UTI with acute kidney injury. Patient was admitted to ICU. Patient had elevated INR on admission and Klebsiella bacteremia causing septic shock. Treated with IV antibiotics. Patient had a peritoneal right perinephric hematoma which was initially treated conservatively with reversal of the INR. Patient developed cardiac arrest on 06/08/2015 and had successful ROC, patient underwent coil embolization of right bleeding site. Post CPR also developed pneumothorax on left requiring chest tube placement. Extubated on 06/18/2015 and reintubated on 06/21/2015 with extubation again on 07/02/2015. On 07/25/2015 underwent left brachiocephalic AV fistula placement. During the course on 06/23/2015 was found to have DVT and was started on heparin and transitioned to Coumadin. Currently further plan is continue monitoring for dialysis tolerance, BR BPR as well as improvement in oral intake.  Assessment and Plan: 1. ESRD on hemodialysis. New long-term HD Tolerating hemodialysis, on Wednesday the patient was able to tolerate HD in chair. Patient already has a place for HD on Monday Wednesday Friday. Unsure about timing. We  will await recommendation from nephrology for HD. Receiving Aranesp, calcitriol, midodrine  Last Foley catheter change was on 07/10/15 0935, patient will continue to follow-up with urology as an outpatient.  2. Anemia of chronic kidney disease. H&H relatively stable. Status post 1 PRBC with HD on 07/27/2015. Appropriate improvement in H&H from 8.4-9.5 FOBT is positive 1. No evidence of active bleeding reported by patient. Continue PPI to twice a day before meals.  3. Hypoalbuminemia Third spacing with pedal edema. Chronic diastolic CHF. Patient appears mildly volume overloaded. Getting hemodialysis to manage volume. We'll continue to closely monitor the patient's volume status.  4. Severe protein calorie malnutrition. Continue nutritional supplementation.  5. Pressure ulcers. Multiple site different stages. Frequent turning and continue wound care.  6. Type 2 diabetes mellitus. At present I would continue reduced dose NPH 10 units twice a day and continue sliding scale insulin.  7. Poor oral intake. Improving at present May need Megace.  8. Atrial fibrillation. Newly Diagnosed DVT Back on Coumadin for anticoagulation. INR subtherapeutic While the INR remains subtherapeutic the patient was started on heparin for bridging. Discussed with nephrology who approves Lovenox dosing for the patient. Pharmacy consulted. We will continue to monitor for evidence of bleeding.  9. Chronic indwelling Foley catheter. Chronic hematuria. Currently resolved. We'll continue to monitor.  10. Septic shock (HCC)-currently resolved Klebsiella pneumoniae bacteremia probably from pyelonephritis. Also associated hemorrhagic shock from right perinephric hematoma. Cardiac arrest, followed by pneumothorax requiring chest tube, currently resolved.  Prolonged course of hospitalization due to elbow. Also had cardiac arrest on 5/3. Completed 14 days of antibiotic on 06/15/2015. At present no evidence  of acute infection. Patient is at high risk for reinfection due to chronic indwelling Foley catheter.  11.  Bruise discoloration of the upper extremity fingers. Vascular ultrasound does not show any evidence of stenosis. Triphasic blood flow.  Pain management: When necessary oxycodone Activity: snf physical therapy Bowel regimen: last BM 07/30/2015 Diet: Renal diet carb modified DVT Prophylaxis: Therapeutic anticoagulation  Advance goals of care discussion: Full code  Family Communication: family was present at bedside, at the time of interview. The pt provided permission to discuss medical plan with the family. Opportunity was given to ask question and all questions were answered satisfactorily.   Disposition:  Discharge to SNF. Expected discharge date: 07/31/2015, pending therapeutic INR.  Consultants:  Nephrology [facility] Vascular surgery  Procedures:  AV fistula creation. Hemodialysis Echocardiogram Chest tube insertion Lower extremity Doppler  Antibiotics: Anti-infectives    Start     Dose/Rate Route Frequency Ordered Stop   07/25/15 0600  cefUROXime (ZINACEF) 1.5 g in dextrose 5 % 50 mL IVPB     1.5 g 100 mL/hr over 30 Minutes Intravenous To [facility] 07/23/15 0939 07/25/15 1345   06/22/15 2300  vancomycin (VANCOCIN) IVPB 1000 mg/200 mL premix  Status:  Discontinued     1,000 mg 200 mL/hr over 60 Minutes Intravenous Every 24 hours 06/21/15 2118 06/22/15 0921   06/21/15 2300  cefTAZidime (FORTAZ) 2 g in dextrose 5 % 50 mL IVPB  Status:  Discontinued     2 g 100 mL/hr over 30 Minutes Intravenous Every 12 hours 06/21/15 2118 06/22/15 0908   06/21/15 2200  vancomycin (VANCOCIN) 2,000 mg in sodium chloride 0.9 % 500 mL IVPB     2,000 mg 250 mL/hr over 120 Minutes Intravenous  Once 06/21/15 2118 06/22/15 0005   06/17/15 1200  ceFAZolin (ANCEF) 3 g in dextrose 5 % 50 mL IVPB     3 g 130 mL/hr over 30 Minutes Intravenous To Radiology 06/17/15  1156 06/17/15 1251   06/17/15 0924  ceFAZolin (ANCEF) 1-5 GM-% IVPB    Comments:  Covington, Jamie   : cabinet override      06/17/15 0924 06/17/15 1222   06/17/15 0924  ceFAZolin (ANCEF) 2-4 GM/100ML-% IVPB    Comments:  Soyla Dryer   : cabinet override      06/17/15 0924 06/17/15 1222   06/04/15 1100  cefTRIAXone (ROCEPHIN) 2 g in dextrose 5 % 50 mL IVPB     2 g 100 mL/hr over 30 Minutes Intravenous Every 24 hours 06/03/15 1038 06/15/15 1134   06/03/15 1800  cefTAZidime (FORTAZ) 1 g in dextrose 5 % 50 mL IVPB  Status:  Discontinued     1 g 100 mL/hr over 30 Minutes Intravenous Every 24 hours 06/03/15 0737 06/03/15 0956   06/03/15 1100  cefTRIAXone (ROCEPHIN) 1 g in dextrose 5 % 50 mL IVPB  Status:  Discontinued     1 g 100 mL/hr over 30 Minutes Intravenous Every 24 hours 06/03/15 0956 06/03/15 1038   06/02/15 1800  cefTAZidime (FORTAZ) 2 g in dextrose 5 % 50 mL IVPB  Status:  Discontinued     2 g 100 mL/hr over 30 Minutes Intravenous Every 12 hours 06/02/15 1306 06/03/15 0737   05/31/15 1800  cefTAZidime (FORTAZ) 2 g in dextrose 5 % 50 mL IVPB  Status:  Discontinued     2 g 100 mL/hr over 30 Minutes Intravenous Every 48 hours 05/31/15 1444 06/02/15 1306   05/31/15 1130  vancomycin (VANCOCIN) 2,250 mg in sodium chloride 0.9 % 500 mL IVPB  Status:  Discontinued     2,250 mg  250 mL/hr over 120 Minutes Intravenous  Once 05/31/15 1124 05/31/15 1128   05/31/15 1130  vancomycin (VANCOCIN) 2,500 mg in sodium chloride 0.9 % 500 mL IVPB     2,500 mg 250 mL/hr over 120 Minutes Intravenous  Once 05/31/15 1128 05/31/15 1347   05/31/15 1115  piperacillin-tazobactam (ZOSYN) IVPB 3.375 g     3.375 g 100 mL/hr over 30 Minutes Intravenous  Once 05/31/15 1114 05/31/15 1239        Intake/Output Summary (Last 24 hours) at 07/30/15 1710 Last data filed at 07/30/15 1340  Gross per 24 hour  Intake 1301.48 ml  Output     50 ml  Net 1251.48 ml   Filed Weights   07/27/15 1958 07/28/15 1900    Weight: 108.954 kg (240 lb 3.2 oz) 110.179 kg (242 lb 14.4 oz)    Objective: Physical Exam: Filed Vitals:   07/29/15 2358 07/30/15 0638 07/30/15 0808 07/30/15 1650  BP:  120/76 115/70 119/75  Pulse: 78 86 85 80  Temp:  98.1 F (36.7 C) 98.4 F (36.9 C) 99.1 F (37.3 C)  TempSrc:  Oral Oral Oral  Resp: 18 16 18 18   Height:      Weight:      SpO2: 97% 96% 98% 99%   General: Alert, Awake and Oriented to Time, Place and Person. Appear in moderate distress Eyes: PERRL, Conjunctiva normal ENT: Oral Mucosa clear moist. Neck: difficult to assess JVD, no Abnormal Mass Or lumps Cardiovascular: S1 and S2 Present, aortic systolic Murmur, Respiratory: Bilateral Air entry equal and Decreased, basal Crackles, no wheezes Abdomen: Bowel Sound present, Soft and no tenderness Skin: no redness, no Rash  Extremities: bilateral Pedal edema, no calf tenderness Neurologic: Grossly no focal neuro deficit. Bilaterally Equal motor strength  Data Reviewed: CBC:  Recent Labs Lab 07/26/15 0800 07/27/15 0830 07/28/15 0553 07/29/15 0303 07/30/15 0533  WBC 5.4 5.3 5.5 6.8 6.9  NEUTROABS  --   --  3.6  --   --   HGB 8.0* 8.4* 9.5* 8.4* 9.1*  HCT 27.5* 29.4* 31.8* 27.8* 30.4*  MCV 92.3 91.3 91.9 90.0 89.9  PLT 207 232 224 240 [id number]   Basic Metabolic Panel:  Recent Labs Lab 07/25/15 0511 07/26/15 0800 07/27/15 0830 07/28/15 0553 07/29/15 0303  NA 132* 133* 135 135 133*  K 3.9 3.9 3.5 3.9 3.5  CL 99* 100* 101 98* 101  CO2 23 24 26 28 22   GLUCOSE 76 71 101* 106* 125*  BUN 43* 55* 44* 30* 47*  CREATININE 3.92* 4.86* 3.49* 2.84* 3.39*  CALCIUM 7.9* 8.1* 7.9* 8.1* 7.9*  MG  --   --   --  1.8 1.7  PHOS 3.1 4.2 3.0  --   --     Liver Function Tests:  Recent Labs Lab 07/25/15 0511 07/26/15 0800 07/27/15 0830 07/28/15 0553  AST  --   --   --  42*  ALT  --   --   --  34  ALKPHOS  --   --   --  239*  BILITOT  --   --   --  0.5  PROT  --   --   --  4.9*  ALBUMIN 1.5* 1.6* 1.5* 1.5*    No results for input(s): LIPASE, AMYLASE in the last 168 hours. No results for input(s): AMMONIA in the last 168 hours. Coagulation Profile:  Recent Labs Lab 07/26/15 0425 07/27/15 [id number] 07/28/15 0553 07/29/15 0303 07/30/15 0533  INR 2.15* 2.08* 1.76* 1.95*  1.93*   Cardiac Enzymes: No results for input(s): CKTOTAL, CKMB, CKMBINDEX, TROPONINI in the last 168 hours. BNP (last 3 results) No results for input(s): PROBNP in the last 8760 hours.  CBG:  Recent Labs Lab 07/29/15 1806 07/29/15 2151 07/30/15 0804 07/30/15 1200 07/30/15 1647  GLUCAP 141* 97 87 115* 74    Studies: No results found.   Scheduled Meds: . sodium chloride   Intravenous Once  . antiseptic oral rinse  7 mL Mouth Rinse BID  . calcitRIOL  0.25 mcg Oral Q M,W,F  . darbepoetin (ARANESP) injection - DIALYSIS  200 mcg Intravenous Q Tue-HD  . enoxaparin (LOVENOX) injection  165 mg Subcutaneous Q24H  . feeding supplement (NEPRO CARB STEADY)  237 mL Oral Q1500  . feeding supplement (PRO-STAT SUGAR FREE 64)  30 mL Oral TID WC  . guaiFENesin  600 mg Oral BID  . insulin aspart  0-15 Units Subcutaneous TID WC  . insulin aspart  0-5 Units Subcutaneous QHS  . insulin NPH Human  10 Units Subcutaneous BID AC & HS  . LORazepam  1 mg Oral QHS  . midodrine  10 mg Oral TID AC  . multivitamin  1 tablet Oral QHS  . nystatin  5 mL Oral QID  . pantoprazole  40 mg Oral BID AC  . warfarin  3 mg Oral ONCE-1800  . Warfarin - Pharmacist Dosing Inpatient   Does not apply q1800   Continuous Infusions: . sodium chloride     PRN Meds: sodium chloride, sodium chloride, acetaminophen (TYLENOL) oral liquid 160 mg/5 mL, albuterol, alteplase, benzocaine, guaiFENesin, heparin, heparin, lidocaine (PF), lidocaine-prilocaine, menthol-cetylpyridinium, ondansetron, oxyCODONE, pentafluoroprop-tetrafluoroeth, phenol, RESOURCE THICKENUP CLEAR, sodium chloride flush  Time spent: 30 minutes  Author: Berle Mull, MD [facility] Pager: [phone] 07/30/2015 5:10 PM  If 7PM-7AM, please contact night-coverage at [email or web address], password [facility]

## 2015-07-30 NOTE — Progress Notes (Signed)
Assessment/ Plan: Pt is a 75 y.o. yo male who was admitted on 05/31/2015 with hematuria/hematoma of solitary kidney- has been HD dependent since 4/26 Assessment/Plan: 1.New ESRD- after bleed in solitary kidney 2. GU- has had indwelling cath for 12 years. 3. Secondary hyperparathyroidism- PTH was 65- is on low dose calcitriol- no binder 4. HTN/volume- BP low- no meds- on midodrine- has pitting edema- BP limits UF  5. Dispo- accepted at [facility]- has AVF and getting coumadin back on board. OP dialysis unit(MWF 11:15 at [facility]) for him As he is able to dialyze in a chair.  Subjective: Awaiting transition to lovenox  With coumadin  Objective: Vital signs in last 24 hours: Temp:  [98.1 F (36.7 C)-99.2 F (37.3 C)] 98.4 F (36.9 C) (06/24 0808) Pulse Rate:  [78-86] 85 (06/24 0808) Resp:  [16-18] 18 (06/24 0808) BP: (98-120)/(64-76) 115/70 mmHg (06/24 0808) SpO2:  [96 %-99 %] 98 % (06/24 0808) Weight change:   Intake/Output from previous day: 06/23 0701 - 06/24 0700 In: 1241.5 [P.O.:720; I.V.:521.5] Out: 1600 [Urine:100] Intake/Output this shift: Total I/O In: 240 [P.O.:240] Out: -   General appearance: alert and cooperative  Pale Weak appearing  Lab Results:  Recent Labs  07/29/15 0303 07/30/15 0533  WBC 6.8 6.9  HGB 8.4* 9.1*  HCT 27.8* 30.4*  PLT 240 214   BMET:  Recent Labs  07/28/15 0553 07/29/15 0303  NA 135 133*  K 3.9 3.5  CL 98* 101  CO2 28 22  GLUCOSE 106* 125*  BUN 30* 47*  CREATININE 2.84* 3.39*  CALCIUM 8.1* 7.9*   No results for input(s): PTH in the last 72 hours. Iron Studies: No results for input(s): IRON, TIBC, TRANSFERRIN, FERRITIN in the last 72 hours. Studies/Results: No results found.  Scheduled: . sodium chloride   Intravenous Once  . antiseptic oral rinse  7 mL Mouth Rinse BID  . calcitRIOL  0.25 mcg Oral Q M,W,F  . darbepoetin (ARANESP) injection - DIALYSIS  200 mcg Intravenous Q Tue-HD  . enoxaparin (LOVENOX)  injection  165 mg Subcutaneous Q24H  . feeding supplement (NEPRO CARB STEADY)  237 mL Oral Q1500  . feeding supplement (PRO-STAT SUGAR FREE 64)  30 mL Oral TID WC  . guaiFENesin  600 mg Oral BID  . insulin aspart  0-15 Units Subcutaneous TID WC  . insulin aspart  0-5 Units Subcutaneous QHS  . insulin NPH Human  10 Units Subcutaneous BID AC & HS  . LORazepam  1 mg Oral QHS  . midodrine  10 mg Oral TID AC  . multivitamin  1 tablet Oral QHS  . nystatin  5 mL Oral QID  . pantoprazole  40 mg Oral BID AC  . warfarin  3 mg Oral ONCE-1800  . Warfarin - Pharmacist Dosing Inpatient   Does not apply q1800     LOS: 60 days   Brinson Tozzi C 07/30/2015,11:31 AM

## 2015-07-30 NOTE — Progress Notes (Signed)
ANTICOAGULATION CONSULT NOTE - Follow Up Consult  Pharmacy Consult for Heparin Indication: Atrial fibrillation and hx PE and DVT, new right leg DVT 06/16/15  No Known Allergies  Patient Measurements: Height: 5' 9.5" (176.5 cm) Weight:  (to be weight on unit, up in chair) IBW/kg (Calculated) : 71.85 Heparin Dosing Weight: 95.5 kg  Vital Signs: Temp: 98.1 F (36.7 C) (06/24 0638) Temp Source: Oral (06/24 [id number]) BP: 120/76 mmHg (06/24 0638) Pulse Rate: 86 (06/24 0638)  Labs:  Recent Labs  07/27/15 0830 07/28/15 0553  07/29/15 0303 07/29/15 1045 07/29/15 1906 07/30/15 0533  HGB 8.4* 9.5*  --  8.4*  --   --  9.1*  HCT 29.4* 31.8*  --  27.8*  --   --  30.4*  PLT 232 224  --  240  --   --  214  LABPROT  --  20.5*  --  22.1*  --   --  21.9*  INR  --  1.76*  --  1.95*  --   --  1.93*  HEPARINUNFRC  --   --   < >  --  0.37 0.25* 0.39  CREATININE 3.49* 2.84*  --  3.39*  --   --   --   < > = values in this interval not displayed.  Estimated Creatinine Clearance: 23.9 mL/min (by C-G formula based on Cr of 3.39).   Assessment: 36 YOM on warfarin PTA for AFib + hx DVT/PE however patient also found to have an acute DVT in R leg (gastric vein and indeterminate age fem and pop veins) per dopplers 5/11. Admitted 4/25 with R peirnephric hematoma/hematuria and BOO of solitary kidney on 4/24. Admit INR 8 on PTA dose. Received several dose of vitamin K between 4/26 and 06/14/15. 5/10 HIT panel neg, SRA neg. Warfarin held 6/15 for AVF placement, done 6/19. Warf resumed 6/20. INR today 1.93  Continues on heparin bridge with AM Heparin level = therapeutic @ 0.39  Hgb 9.1, PLT 214, no bleeding noted   Goal of Therapy:  INR 2-3 Heparin level 0.3-0.7 units/ml Monitor platelets by anticoagulation protocol: Yes   Plan:  Contine heparin @ 1650 units / hr Follow up 8hr cHL Daily HL/CBC   Jayvier Burgher C. Lennox Grumbles, PharmD Pharmacy Resident  Pager: [phone] 07/30/2015 8:02 AM

## 2015-07-30 NOTE — Progress Notes (Signed)
ANTICOAGULATION CONSULT NOTE - Follow Up Consult  Pharmacy Consult for Coumadin/Lovenox Indication: Atrial fibrillation and hx PE and DVT, new right leg DVT 06/16/15  No Known Allergies  Patient Measurements: Height: 5' 9.5" (176.5 cm) Weight:  (to be weight on unit, up in chair) IBW/kg (Calculated) : 71.85  Vital Signs: Temp: 98.4 F (36.9 C) (06/24 0808) Temp Source: Oral (06/24 0808) BP: 115/70 mmHg (06/24 0808) Pulse Rate: 85 (06/24 0808)  Labs:  Recent Labs  07/28/15 0553  07/29/15 0303 07/29/15 1045 07/29/15 1906 07/30/15 0533  HGB 9.5*  --  8.4*  --   --  9.1*  HCT 31.8*  --  27.8*  --   --  30.4*  PLT 224  --  240  --   --  214  LABPROT 20.5*  --  22.1*  --   --  21.9*  INR 1.76*  --  1.95*  --   --  1.93*  HEPARINUNFRC  --   < >  --  0.37 0.25* 0.39  CREATININE 2.84*  --  3.39*  --   --   --   < > = values in this interval not displayed.  Estimated Creatinine Clearance: 23.9 mL/min (by C-G formula based on Cr of 3.39).   Assessment: 48 YOM on warfarin PTA for AFib + hx DVT/PE however patient also found to have an acute DVT in R leg (gastric vein and indeterminate age fem and pop veins) per dopplers 5/11. Admitted 4/25 with R peirnephric hematoma/hematuria and BOO of solitary kidney on 4/24. Admit INR 8 on PTA dose. Received several dose of vitamin K between 4/26 and 06/14/15. 5/10 HIT panel neg, SRA neg. Warfarin held 6/15 for AVF placement, done 6/19. Warf resumed 6/20.  Now discontinuing Heparin Drip and switching to lovenox 1.5mg /kg daily per MD request in anticipation of D/C on 6/25.  INR today 1.93. H/H stable, no bleeding noted   Goal of Therapy:  INR 2-3 Monitor platelets by anticoagulation protocol: Yes   Plan:  - Discontinue heparin gtt  - Lovenox 165mg  Q24h (1.5mg /kg) per MD request   - Repeat Warfarin 3 mg x 1 dose at 1800 today  - F/u INR  Cherice Glennie C. Lennox Grumbles, PharmD Pharmacy Resident  Pager: [phone] 07/30/2015 10:03 AM

## 2015-07-31 DIAGNOSIS — Z905 Acquired absence of kidney: Secondary | ICD-10-CM

## 2015-07-31 LAB — TYPE AND SCREEN
ABO/RH(D): A POS
Antibody Screen: NEGATIVE
Unit division: 0
Unit division: 0

## 2015-07-31 LAB — PROTIME-INR
INR: 1.97 — AB (ref 0.00–1.49)
Prothrombin Time: 22.3 seconds — ABNORMAL HIGH (ref 11.6–15.2)

## 2015-07-31 LAB — GLUCOSE, CAPILLARY
GLUCOSE-CAPILLARY: 72 mg/dL (ref 65–99)
Glucose-Capillary: 117 mg/dL — ABNORMAL HIGH (ref 65–99)

## 2015-07-31 MED ORDER — WARFARIN SODIUM 5 MG PO TABS: 5.0000 mg | ORAL_TABLET | Freq: Once | ORAL | Status: DC

## 2015-07-31 MED ORDER — RESOURCE THICKENUP CLEAR PO POWD: 1.0000 g | ORAL | Status: DC | PRN

## 2015-07-31 MED ORDER — ENOXAPARIN SODIUM 150 MG/ML ~~LOC~~ SOLN: 165.0000 mg | SUBCUTANEOUS | Status: DC

## 2015-07-31 MED ORDER — MENTHOL 3 MG MT LOZG: 1.0000 | LOZENGE | OROMUCOSAL | Status: DC | PRN

## 2015-07-31 MED ORDER — GUAIFENESIN 100 MG/5ML PO SOLN: 10.0000 mL | Freq: Three times a day (TID) | ORAL | Status: AC

## 2015-07-31 MED ORDER — MIDODRINE HCL 10 MG PO TABS: 10.0000 mg | ORAL_TABLET | Freq: Three times a day (TID) | ORAL | Status: AC

## 2015-07-31 MED ORDER — ENOXAPARIN SODIUM 150 MG/ML ~~LOC~~ SOLN: 150.0000 mg | SUBCUTANEOUS | Status: DC

## 2015-07-31 MED ORDER — RENA-VITE PO TABS: 1.0000 | ORAL_TABLET | Freq: Every day | ORAL | Status: AC

## 2015-07-31 MED ORDER — NYSTATIN 100000 UNIT/ML MT SUSP: 5.0000 mL | Freq: Four times a day (QID) | OROMUCOSAL | Status: DC

## 2015-07-31 MED ORDER — PANTOPRAZOLE SODIUM 40 MG PO TBEC
40.0000 mg | DELAYED_RELEASE_TABLET | Freq: Two times a day (BID) | ORAL | Status: DC
Start: 2015-07-31 — End: 2015-08-24

## 2015-07-31 MED ORDER — INSULIN ASPART 100 UNIT/ML ~~LOC~~ SOLN: SUBCUTANEOUS | Status: AC

## 2015-07-31 MED ORDER — NEPRO/CARBSTEADY PO LIQD: 237.0000 mL | Freq: Every day | ORAL | Status: DC

## 2015-07-31 MED ORDER — HYDROCODONE-ACETAMINOPHEN 5-325 MG PO TABS: 1.0000 | ORAL_TABLET | Freq: Three times a day (TID) | ORAL | Status: DC | PRN

## 2015-07-31 MED ORDER — LORAZEPAM 1 MG PO TABS: 1.0000 mg | ORAL_TABLET | Freq: Every day | ORAL | Status: DC

## 2015-07-31 MED ORDER — PRO-STAT SUGAR FREE PO LIQD: 30.0000 mL | Freq: Three times a day (TID) | ORAL | Status: DC

## 2015-07-31 NOTE — Progress Notes (Signed)
Pt prepared for d/c to SNF. IV d/c'd. Skin intact except as charted in most recent assessments. Vitals are stable. Report called to receiving facility. Pt to be transported by ambulance service.  At 14:25, left message on voicemail for [facility], to give report.  Attempt 2, called back at 14:30, was transferred to nurses station & call was not answered.  At 15:14, gave verbal report to [facility] (SNF).  Jillyn Ledger, MBA, BSN, RN

## 2015-07-31 NOTE — Clinical Social Work Placement (Signed)
   CLINICAL SOCIAL WORK PLACEMENT  NOTE  Date:  07/31/2015  Patient Details  Name: [name] MRN: [id number] Date of Birth: [birth year]  Clinical Social Work is seeking post-discharge placement for this patient at the [place] level of care (*CSW will initial, date and re-position this form in  chart as items are completed):  Yes   Patient/family provided with [facility] Work Department's list of facilities offering this level of care within the geographic area requested by the patient (or if unable, by the patient's family).  Yes   Patient/family informed of their freedom to choose among providers that offer the needed level of care, that participate in Medicare, Medicaid or managed care program needed by the patient, have an available bed and are willing to accept the patient.  Yes   Patient/family informed of [facility]'s ownership interest in [facility] and [facility], as well as of the fact that they are under no obligation to receive care at these facilities.  PASRR submitted to EDS on 07/11/15     PASRR number received on 07/11/15     Existing PASRR number confirmed on       FL2 transmitted to all facilities in geographic area requested by pt/family on 07/11/15     FL2 transmitted to all facilities within larger geographic area on       Patient informed that his/her managed care company has contracts with or will negotiate with certain facilities, including the following:        Yes   Patient/family informed of bed offers received.  Patient chooses bed at [facility]     Physician recommends and patient chooses bed at      Patient to be transferred to [facility] on 07/31/15.  Patient to be transferred to facility by Ambulance     Patient family notified on 07/31/15 of transfer.  Name of family member notified:  [name]     PHYSICIAN Please prepare priority discharge summary, including medications,  Please prepare prescriptions, Please sign FL2, Please sign DNR     Additional Comment:   Per MD patient ready for DC to [facility]. RN, patient, patient's family, and facility notified of DC. RN given number for report. DC packet on chart. Ambulance transport requested for patient for 2PM pickup. CSW signing off.  _______________________________________________ Rigoberto Noel, LCSW 07/31/2015, 12:37 PM

## 2015-07-31 NOTE — Discharge Summary (Addendum)
[facility] Discharge Summary   Patient: [name] [name]   PCP: Melinda Crutch DOB: April 02, 1940   Date of admission: 05/31/2015   Date of discharge:  07/31/2015    Discharge Diagnoses:  Principal Problem:   Septic shock ([place]) Active Problems:   Essential hypertension   Gross hematuria   Atrial fibrillation (HCC)   Urinary tract infectious disease   Encounter for central line placement   Secondary cardiomyopathy ([place])   Chronic atrial fibrillation ([place])   Uncontrolled type 2 diabetes mellitus with complication ([place])   Perinephric hematoma   Chest tube in place   Acute hypoxemic respiratory failure (HCC)   Acute on chronic renal failure (HCC)   Hemorrhagic shock   S/p nephrectomy   Sepsis due to Klebsiella (HCC)   OSA (obstructive sleep apnea)   Chronic diastolic CHF (congestive heart failure) ([place])   Cardiac arrest ([place])   Acute deep vein thrombosis (DVT) of distal vein of right lower extremity (HCC)   Traumatic pneumothorax   OSA on CPAP   Hypoalbuminemia  Admitted From: home Disposition:  SNF  Recommendations for Outpatient Follow-up:  1. Please follow-up with PCP in one week with CBC. 2. 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 3. Please follow-up with vascular surgery for AV fistula. 4. Please follow-up with urology for catheter change as well as follow-up on prostate cancer in 1 week, catheter change scheduled on 08/09/2015. 5. Please continue Lovenox until INR is therapeutic. 6. Please continue flutter device for sputum clearance  Follow-up Information    Follow up with [facility].   Specialty:  [place] information:   [address] [place] [phone]      Follow up with [facility]. Schedule an appointment as soon as possible for a visit in 1 week.   Specialty:  Obstetrics and Gynecology   Why:  with CBC      Follow up with  Ardis Hughs, MD. Schedule an appointment as soon as possible for a visit in 1 week.   Specialty:  Urology   Why:  for catheter change and follow up   Contact information:   [place] [address] [phone]       Follow up with Ruta Hinds, MD. Schedule an appointment as soon as possible for a visit in 4 weeks.   Specialties:  Vascular Surgery, Cardiology   Contact information:   [address] [place] [address] [phone]       Go on 08/01/2015 to follow up.   Why:  INR check up.      Follow up with Ruby Cola, MD. Schedule an appointment as soon as possible for a visit in 2 weeks.   Specialty:  Otolaryngology   Why:  for vocal cord injury and hoarsness of voice   Contact information:   [address] [place] [address] [phone]      Diet recommendation: Dysphagia type III, thin consistency  Activity: The patient is advised to gradually reintroduce usual activities.  Discharge Condition: fair  Code Status: Full code  History of present illness: As per the H and P dictated on admission, "[name] is a 75 y.o. male with PMH as outlined below including prostate CA s/p radiation 2011 and RCC s/p left nephrectomy. He was seen in ED 4/22 for clogged catheter which was irrigated and replaced. He apparently has had hematuria  for quite some time which has been attributed to scarring from prior radiation.  He had been in his USOH until roughly 4/20 when he had decreased appetite. He had not been eating or drinking for the past 5 days. On 04/25 he went to have his INR checked (on coumadin for A.fib and hx DVT / PE) and was found to have INR of 8 and was hypotensive with SBP 70/44. He was subsequently sent to ED for further evaluation.  In ED, he was found to have UTI and despite 4.5L IVF, he remained hypotensive. He also had multiple metabolic derangements including Na 124, K 5.0, AGMA, lactate 3, SCr 9.36. PCCM was  therefore consulted for admission of septic shock due to urosepsis as well as AKI."  Hospital Course:  Pt. with PMH of Prostate cancer status post radiation, RCC status post left nephrectomy, sleep apnea, prior DVT, CK-MB stage III, HTN, DM; admitted on 05/31/2015, with complaint of hypotension, was found to have septic shock due to UTI with acute kidney injury. Patient was admitted to ICU. Patient had elevated INR on admission and Klebsiella bacteremia causing septic shock. Treated with IV antibiotics. Patient had a peritoneal right perinephric hematoma which was initially treated conservatively with reversal of the INR.  Patient developed cardiac arrest on 06/08/2015 and had successful return of circulation. It was felt that anemia was cause of patient's cardiac arrest. Patient underwent coil embolization of right bleeding site.  Post CPR also developed pneumothorax on left requiring chest tube placement.  Extubated on 06/18/2015 and reintubated on 06/21/2015 with extubation again on 07/02/2015. On 07/25/2015 underwent left brachiocephalic AV fistula placement. During the course on 06/23/2015 was found to have DVT and was started on heparin and transitioned to Coumadin. Currently on Lovenox and Coumadin for bridging.  His primary issue at this point is profound deconditioning from prolonged hospital course as well as severe protein calorie malnutrition and dysphagia.  Summary of his active problems in the hospital is as following.  1. Septic shock (HCC)-currently resolved Klebsiella pneumoniae bacteremia probably from pyelonephritis. Also associated hemorrhagic shock from right perinephric hematoma. Cardiac arrest, followed by pneumothorax requiring chest tube, currently resolved.  Prolonged course of hospitalization due to above. Also had cardiac arrest on 5/3. Underwent IR guided coiling of the right lower pole feeding artery which was the cause of hematoma Completed 14 days of antibiotic on  06/15/2015. At present no evidence of acute infection. Patient is at high risk for reinfection due to chronic indwelling Foley catheter.  2. ESRD on hemodialysis. New long-term HD Tolerating hemodialysis, on Wednesday the patient was able to tolerate HD in chair. Receiving Aranesp, calcitriol, midodrine  Last Foley catheter change was on 07/10/15 0935, patient will continue to follow-up with urology as an outpatient.  3. Hypoalbuminemia Third spacing with pedal edema. Chronic diastolic CHF. Getting hemodialysis to manage volume. Volume status significantly improved with hemodialysis.  4. Severe protein calorie malnutrition. Continue nutritional supplementation.  5. Pressure ulcers. Multiple site different stages. Frequent turning and continue wound care.  6. Type 2 diabetes mellitus. Patient was given 10 units of NPH twice a day. At present I would discharge him on sliding scale insulin only and his NPH dose can be resumed later.  7. Poor oral intake. Improving at present May need Megace.  8. Atrial fibrillation. Newly Diagnosed DVT Back on Coumadin for anticoagulation. INR subtherapeutic While the INR remains subtherapeutic the patient was started on heparin for bridging. Discussed with nephrology who approves Lovenox dosing for  the patient. Continue to monitor for evidence of bleeding.  9. Chronic indwelling Foley catheter. Chronic hematuria. Right perinephric hematoma Currently resolved. We'll continue to monitor. Next catheter change scheduled on 08/09/2015. Follow-up with urology.  10. Anemia of chronic kidney disease. H&H stable. Status post 1 PRBC with HD on 07/27/2015. Appropriate improvement in H&H from 8.4-9.5 FOBT is positive 1. No evidence of active bleeding reported by patient. Continue PPI to twice a day before meals, for 14 days and then transition to once a day.  11. Oral thrush. Nystatin, complete a 14 day treatment course.  12. Blue  discoloration of the upper extremity fingers. Vascular ultrasound does not show any evidence of stenosis. Triphasic blood flow. Follow-up with vascular surgery as scheduled.  13. Obstructive sleep apnea on C Pap. 12 cm of H2O with 2 L/m oxygen setting. Continue daily at bedtime  All other chronic medical condition were stable during the hospitalization.  Patient was seen by physical therapy, who recommended SNF, which was arranged by Education officer, museum and case Freight forwarder. On the day of the discharge the patient's vitals were stable, and no other acute medical condition were reported by patient. the patient was felt safe to be discharge at SNF with therapy.  Consultants:  Nephrology PCCM Orthopedics Urology Vascular surgery Interventional radiology  Procedures:   IR guided Segmental right kidney lower pole feeding artery coil embolization 06/08/2015.  Left brachiocephalic AV fistula creation. 07/25/2015  Echocardiogram 06/13/2015 Study Conclusions  - Procedure narrative: Transthoracic echocardiography. Image  quality was suboptimal. The study was technically difficult, as a  result of body habitus. - Left ventricle: Systolic function was vigorous. The estimated  ejection fraction was in the range of 65% to 70%. Images were  inadequate for LV wall motion assessment. The study is not  technically sufficient to allow evaluation of LV diastolic  function.  Impressions:  - Technically difficult study with severely reduced echo windows.  Hyperdynamic LVEF of 65-70%.  Echocardiogram 06/28/2015 Study Conclusions  - Left ventricle: The cavity size was normal. Wall thickness was  increased in a pattern of moderate LVH. Systolic function was  vigorous. The estimated ejection fraction was in the range of 65%  to 70%. Wall motion was normal; there were no regional wall  motion abnormalities. - Left atrium: The atrium was mildly dilated.   Lower extremity venous  Doppler Summary:  - Findings consistent with acute deep vein thrombosis involving the  right gastrocnemius vein. - Findings consistent with indeterminate age deep vein thrombosis  involving the right femoral vein and right popliteal vein. - Cannot exclude deep vein thrombosis involving the right posterial  tibial vein, right peroneal vein, left posterial tibial vein, and  left peroneal vein.  Left lower extremity arterial Doppler Suggestion of no significant stenosis noted in the left lower extremity.  Bilateral upper extremity Doppler Summary:  - Normal study. - No evidence of stenosis or occlusion. - No significant disease was identified by duplex examination.  Hemodialysis Chest tube insertion  DISCHARGE MEDICATION: Current Discharge Medication List    START taking these medications   Details  Amino Acids-Protein Hydrolys (FEEDING SUPPLEMENT, PRO-STAT SUGAR FREE 64,) LIQD Take 30 mLs by mouth 3 (three) times daily with meals. Qty: 900 mL, Refills: 0    enoxaparin (LOVENOX) 150 MG/ML injection Inject 1 mL (150 mg total) into the skin daily. Qty: 6 Syringe, Refills: 0    guaiFENesin (ROBITUSSIN) 100 MG/5ML SOLN Take 10 mLs (200 mg total) by mouth every 8 (eight) hours. Qty:  473 mL, Refills: 0    insulin aspart (NOVOLOG) 100 UNIT/ML injection CBG 121 - 150:2u, CBG 151 - 200:3u, CBG 201 - 250:5u, CBG 251 - 300:8u,CBG 301 - 350:11u, CBG 351 - 400:15u Qty: 10 mL, Refills: 0    LORazepam (ATIVAN) 1 MG tablet Take 1 tablet (1 mg total) by mouth at bedtime. Qty: 10 tablet, Refills: 0    Maltodextrin-Xanthan Gum (RESOURCE THICKENUP CLEAR) POWD Take 1 g by mouth as needed. Qty: 125 g, Refills: 0    menthol-cetylpyridinium (CEPACOL) 3 MG lozenge Take 1 lozenge (3 mg total) by mouth as needed for sore throat. Qty: 100 tablet, Refills: 12    midodrine (PROAMATINE) 10 MG tablet Take 1 tablet (10 mg total) by mouth 3 (three) times daily before meals. Qty: 60 tablet,  Refills: 0    multivitamin (RENA-VIT) TABS tablet Take 1 tablet by mouth at bedtime. Qty: 30 each, Refills: 0    Nutritional Supplements (FEEDING SUPPLEMENT, NEPRO CARB STEADY,) LIQD Take 237 mLs by mouth daily at 3 pm. Qty: 21 Can, Refills: 0    nystatin (MYCOSTATIN) 100000 UNIT/ML suspension Take 5 mLs (500,000 Units total) by mouth 4 (four) times daily. Qty: 60 mL, Refills: 0    pantoprazole (PROTONIX) 40 MG tablet Take 1 tablet (40 mg total) by mouth 2 (two) times daily before a meal. After 08/12/15 take once a day Qty: 30 tablet, Refills: 0      CONTINUE these medications which have CHANGED   Details  HYDROcodone-acetaminophen (NORCO/VICODIN) 5-325 MG tablet Take 1 tablet by mouth every 8 (eight) hours as needed for moderate pain or severe pain (breakthrough pain). Qty: 15 tablet, Refills: 0    warfarin (COUMADIN) 5 MG tablet Take 1 tablet (5 mg total) by mouth one time only at 6 PM. Qty: 10 tablet, Refills: 0      CONTINUE these medications which have NOT CHANGED   Details  fenofibrate (TRICOR) 145 MG tablet Take 145 mg by mouth daily.     ferrous sulfate 325 (65 FE) MG EC tablet take 1 tablet by mouth once daily Qty: 30 tablet, Refills: 6    polyvinyl alcohol (LIQUIFILM TEARS) 1.4 % ophthalmic solution Place 1 drop into both eyes 2 (two) times daily as needed for dry eyes.      STOP taking these medications     carvedilol (COREG) 3.125 MG tablet      furosemide (LASIX) 40 MG tablet      Niacin CR 1000 MG TBCR      potassium chloride (K-DUR,KLOR-CON) 10 MEQ tablet      quinapril (ACCUPRIL) 20 MG tablet        No Known Allergies Discharge Instructions    DIET DYS 3    Complete by:  As directed   dysphagia 3, thin liquids  Medication Administration: Crushed with puree   Fluid consistency:  Thin     Diet renal 60/70-03-09-1198    Complete by:  As directed      Discharge instructions    Complete by:  As directed   It is important that you read following  instructions as well as go over your medication list with RN to help you understand your care after this hospitalization.  Discharge Instructions:  Please follow-up with PCP in one week, ENT and urology in 2 weeks, INR check on Monday, vascular surgery in 4-6 weeks.  Please request your primary care physician to go over all Hospital Tests and Procedure/Radiological results at the follow up,  Please get all Hospital records sent to your PCP by signing hospital release before you go home.   Do not drive, operating heavy machinery, perform activities at heights, swimming or participation in water activities or provide baby sitting services; until you have been seen by Primary Care Physician or a Neurologist and advised to do so again. Do not take more than prescribed Pain, Sleep and Anxiety Medications. You were cared for by a hospitalist during your hospital stay. If you have any questions about your discharge medications or the care you received while you were in the hospital after you are discharged, you can call the unit and ask to speak with the hospitalist on call if the hospitalist that took care of you is not available.  Once you are discharged, your primary care physician will handle any further medical issues. Please note that NO REFILLS for any discharge medications will be authorized once you are discharged, as it is imperative that you return to your primary care physician (or establish a relationship with a primary care physician if you do not have one) for your aftercare needs so that they can reassess your need for medications and monitor your lab values. You Must read complete instructions/literature along with all the possible adverse reactions/side effects for all the Medicines you take and that have been prescribed to you. Take any new Medicines after you have completely understood and accept all the possible adverse reactions/side effects. Wear Seat belts while driving.      Increase activity slowly    Complete by:  As directed           Discharge Exam: Filed Weights   07/30/15 2136 07/31/15 0500  Weight: 109.589 kg (241 lb 9.6 oz) 109.6 kg (241 lb 10 oz)   Filed Vitals:   07/31/15 0540 07/31/15 0901  BP: 121/83 114/79  Pulse: 84 104  Temp: 98 F (36.7 C) 97.6 F (36.4 C)  Resp: 18 17   General: Appear in mild distress, no Rash; Oral Mucosa moist. Cardiovascular: S1 and S2 Present, aortic systolic Murmur, difficult to assess JVD Respiratory: Bilateral Air entry present and Clear to Auscultation, no Crackles, no wheezes Abdomen: Bowel Sound present, Soft and no tenderness Extremities: bilateral Pedal edema improving, no calf tenderness Neurology: Grossly no focal neuro deficit.  The results of significant diagnostics from this hospitalization (including imaging, microbiology, ancillary and laboratory) are listed below for reference.    Significant Diagnostic Studies: Dg Shoulder Right  07/07/2015  CLINICAL DATA:  Right shoulder pain. Recent rotator cuff surgery. No known injury. EXAM: RIGHT SHOULDER - 2+ VIEW COMPARISON:  Single-view of the chest 06/19/2015 and 07/06/2015. FINDINGS: There is lucency in the greater tuberosity. There appears to be a small focus of cortical disruption present. Mild appearing acromioclavicular degenerative change is seen. Dialysis catheter and feeding tube are noted. IMPRESSION: Small cortical defect in the greater tuberosity of the right humeral head and increased lucency in the greater tuberosity could be due to infection or related to the patient's recent surgery. Projection is different than on prior chest x-rays but the findings appear more conspicuous. Electronically Signed   By: Inge Rise M.D.   On: 07/07/2015 17:24   Dg Chest Port 1 View  07/20/2015  CLINICAL DATA:  Fevers EXAM: PORTABLE CHEST 1 VIEW COMPARISON:  07/06/2015 FINDINGS: Dialysis catheter is again noted and stable. Cardiac shadow remains enlarged. A  feeding catheter is been removed in the interval. The lungs are clear. Healing left rib fractures are  noted. IMPRESSION: No acute abnormality noted. Electronically Signed   By: Inez Catalina M.D.   On: 07/20/2015 10:23   Dg Chest Port 1 View  07/06/2015  CLINICAL DATA:  Acute renal failure. EXAM: PORTABLE CHEST 1 VIEW COMPARISON:  06/27/2015 . FINDINGS: Interim extubation. Dialysis catheter and left subclavian central line in stable position. Feeding tube in stable position. Stable cardiomegaly. Persistent left base subsegmental atelectasis and or infiltrate with slight interim progression. No pleural effusion or pneumothorax. IMPRESSION: 1. Interim extubation. Right dialysis catheter left subclavian central line stable position. Feeding tube in stable position. 2. Slight interim progression of left base atelectasis and or infiltrate. Electronically Signed   By: Marcello Moores  Register   On: 07/06/2015 07:39   Dg Swallowing Func-speech Pathology  07/14/2015  Objective Swallowing Evaluation: Type of Study: MBS-Modified Barium Swallow Study Patient Details Name: [name] MRN: [id number] Date of Birth: [birth year] Today's Date: 07/14/2015 Time: SLP Start Time (ACUTE ONLY): 1341-SLP Stop Time (ACUTE ONLY): 1350 SLP Time Calculation (min) (ACUTE ONLY): 9 min Past Medical History: Past Medical History Diagnosis Date . Obesity  . Phlebitis    Lower extermity . Pulmonary emboli ([place]) 2008   submassive, saddle . Prostate cancer ([place]) 07/2009 . Sleep apnea    on CPAP . Hx of echocardiogram 12/04/2010   Normal EF >55% no significant valve disease . History of stress test 06/27/2009   Low risk and EF of approximately 50% . DVT (deep venous thrombosis) ([place])  . Chronic kidney disease, stage 3    baseline creatinine ~1.4 . HLD (hyperlipidemia)  . HTN (hypertension)  . Dysrhythmia    A fib . Diabetes mellitus ([place])    diet controlled . History of hiatal hernia  Past Surgical History: Past Surgical History Procedure Laterality Date .  Nephrectomy  1999   for CA . Cholecystectomy  1999 . Transurethral resection of bladder tumor N/A 03/04/2013   Procedure: CYSTOSCOPY WITH RIGHT RETROGRADE PYELOGRAM AND BLADDER BIOPSY /CLOT EVACUATION/ BIOPSY PROSTATIC URETHRA WITH FULGERATION ;  Surgeon: Molli Hazard, MD;  Location: [facility];  Service: Urology;  Laterality: N/A; . Cystoscopy w/ retrogrades N/A 04/08/2013   Procedure: CYSTOSCOPY WITH CLOT EVACUATION;  Surgeon: Molli Hazard, MD;  Location: [facility];  Service: Urology;  Laterality: N/A; . Left shoulder repair   . Shoulder open rotator cuff repair Right 05/03/2015   Procedure: RIGHT SHOULDER ROTATOR CUFF REPAIR OPEN WITH GRAFT AND ANCHOR ;  Surgeon: Latanya Maudlin, MD;  Location: [facility];  Service: Orthopedics;  Laterality: Right; HPI: 75 yo male with hemorrhagic shock leading to cardiac arrest, VDRF, AKI from Rt perinephric hematoma. He was intubated 5/03-5/13, 5/16-5/27. He has hx of Lt renal cell carcinoma s/p Lt nephrectomy, prostate cancer s/p XRT, PE in 2008, OSA, HTN, HLD, DM, A fib. Subjective: pt alert, pleasant Assessment / Plan / Recommendation CHL IP CLINICAL IMPRESSIONS 07/14/2015 Therapy Diagnosis Mild pharyngeal phase dysphagia;Moderate pharyngeal phase dysphagia Clinical Impression Pt shows improvements in his mild-moderate oropharyngeal dysphagia since previous MBS. Oral phase overall is strong and effective, although he does have piecemeal swallowing particularly with liquids. He continues to have a brief delay in swallow trigger but now with most boluses being contained within the valleculae prior to initiation. The exception is when he consumes thin liquids via straw, as they reach the pyriform sinuses and are subsequently aspirated before the swallow. Cued coughing is productive of secretions mixed with barium, although it is unclear if all aspirates were expelled. A mild-moderate amount of residue  still remains with solid textures. Recommend to continue Dys 2 diet but with  advancement to thin liquids by cup. Anticipate further improvements as overall strength continues to progress. Impact on safety and function Mild aspiration risk   CHL IP TREATMENT RECOMMENDATION 07/14/2015 Treatment Recommendations Therapy as outlined in treatment plan below   Prognosis 07/14/2015 Prognosis for Safe Diet Advancement Good Barriers to Reach Goals -- Barriers/Prognosis Comment -- CHL IP DIET RECOMMENDATION 07/14/2015 SLP Diet Recommendations Dysphagia 2 (Fine chop) solids;Thin liquid Liquid Administration via Cup;No straw Medication Administration Whole meds with puree Compensations Slow rate;Small sips/bites;Multiple dry swallows after each bite/sip Postural Changes Remain semi-upright after after feeds/meals (Comment);Seated upright at 90 degrees   CHL IP OTHER RECOMMENDATIONS 07/14/2015 Recommended Consults -- Oral Care Recommendations Oral care BID Other Recommendations Order thickener from pharmacy;Prohibited food (jello, ice cream, thin soups);Remove water pitcher   CHL IP FOLLOW UP RECOMMENDATIONS 07/14/2015 Follow up Recommendations LTACH   CHL IP FREQUENCY AND DURATION 07/14/2015 Speech Therapy Frequency (ACUTE ONLY) min 2x/week Treatment Duration 2 weeks      CHL IP ORAL PHASE 07/14/2015 Oral Phase Impaired Oral - Pudding Teaspoon -- Oral - Pudding Cup -- Oral - Honey Teaspoon -- Oral - Honey Cup -- Oral - Nectar Teaspoon NT Oral - Nectar Cup NT Oral - Nectar Straw NT Oral - Thin Teaspoon NT Oral - Thin Cup Piecemeal swallowing Oral - Thin Straw Piecemeal swallowing Oral - Puree WFL Oral - Mech Soft WFL Oral - Regular -- Oral - Multi-Consistency -- Oral - Pill -- Oral Phase - Comment --  CHL IP PHARYNGEAL PHASE 07/14/2015 Pharyngeal Phase Impaired Pharyngeal- Pudding Teaspoon -- Pharyngeal -- Pharyngeal- Pudding Cup -- Pharyngeal -- Pharyngeal- Honey Teaspoon -- Pharyngeal -- Pharyngeal- Honey Cup -- Pharyngeal -- Pharyngeal- Nectar Teaspoon NT Pharyngeal -- Pharyngeal- Nectar Cup NT Pharyngeal --  Pharyngeal- Nectar Straw NT Pharyngeal -- Pharyngeal- Thin Teaspoon NT Pharyngeal -- Pharyngeal- Thin Cup Delayed swallow initiation-vallecula;Reduced tongue base retraction Pharyngeal Material does not enter airway Pharyngeal- Thin Straw Delayed swallow initiation-pyriform sinuses;Penetration/Aspiration before swallow;Reduced tongue base retraction Pharyngeal Material enters airway, passes BELOW cords without attempt by patient to eject out (silent aspiration) Pharyngeal- Puree Delayed swallow initiation-vallecula;Reduced tongue base retraction;Pharyngeal residue - valleculae Pharyngeal -- Pharyngeal- Mechanical Soft Delayed swallow initiation-vallecula;Reduced tongue base retraction;Pharyngeal residue - valleculae Pharyngeal -- Pharyngeal- Regular -- Pharyngeal -- Pharyngeal- Multi-consistency -- Pharyngeal -- Pharyngeal- Pill -- Pharyngeal -- Pharyngeal Comment --  CHL IP CERVICAL ESOPHAGEAL PHASE 07/14/2015 Cervical Esophageal Phase WFL Pudding Teaspoon -- Pudding Cup -- Honey Teaspoon -- Honey Cup -- Nectar Teaspoon -- Nectar Cup -- Nectar Straw -- Thin Teaspoon -- Thin Cup -- Thin Straw -- Puree -- Mechanical Soft -- Regular -- Multi-consistency -- Pill -- Cervical Esophageal Comment -- No flowsheet data found. Germain Osgood, M.A. CCC-SLP [phone] Germain Osgood 07/14/2015, 2:47 PM              Dg Swallowing Func-speech Pathology  07/05/2015  Objective Swallowing Evaluation: Type of Study: MBS-Modified Barium Swallow Study Patient Details Name: [name] MRN: [id number] Date of Birth: 02/27/1940 Today's Date: 07/05/2015 Time: SLP Start Time (ACUTE ONLY): 1051-SLP Stop Time (ACUTE ONLY): 1114 SLP Time Calculation (min) (ACUTE ONLY): 23 min Past Medical History: Past Medical History Diagnosis Date . Obesity  . Phlebitis    Lower extermity . Pulmonary emboli ([place]) 2008   submassive, saddle . Prostate cancer ([place]) 07/2009 . Sleep apnea    on CPAP . Hx of echocardiogram 12/04/2010   Normal EF >55%  no  significant valve disease . History of stress test 06/27/2009   Low risk and EF of approximately 50% . DVT (deep venous thrombosis) ([place])  . Chronic kidney disease, stage 3    baseline creatinine ~1.4 . HLD (hyperlipidemia)  . HTN (hypertension)  . Dysrhythmia    A fib . Diabetes mellitus ([place])    diet controlled . History of hiatal hernia  Past Surgical History: Past Surgical History Procedure Laterality Date . Nephrectomy  1999   for CA . Cholecystectomy  1999 . Transurethral resection of bladder tumor N/A 03/04/2013   Procedure: CYSTOSCOPY WITH RIGHT RETROGRADE PYELOGRAM AND BLADDER BIOPSY /CLOT EVACUATION/ BIOPSY PROSTATIC URETHRA WITH FULGERATION ;  Surgeon: Molli Hazard, MD;  Location: [facility];  Service: Urology;  Laterality: N/A; . Cystoscopy w/ retrogrades N/A 04/08/2013   Procedure: CYSTOSCOPY WITH CLOT EVACUATION;  Surgeon: Molli Hazard, MD;  Location: [facility];  Service: Urology;  Laterality: N/A; . Left shoulder repair   . Shoulder open rotator cuff repair Right 05/03/2015   Procedure: RIGHT SHOULDER ROTATOR CUFF REPAIR OPEN WITH GRAFT AND ANCHOR ;  Surgeon: Latanya Maudlin, MD;  Location: [facility];  Service: Orthopedics;  Laterality: Right; HPI: 75 yo male with hemorrhagic shock leading to cardiac arrest, VDRF, AKI from Rt perinephric hematoma. He was intubated 5/03-5/13, 5/16-5/27. He has hx of Lt renal cell carcinoma s/p Lt nephrectomy, prostate cancer s/p XRT, PE in 2008, OSA, HTN, HLD, DM, A fib. Subjective: pt alert, pleasant Assessment / Plan / Recommendation CHL IP CLINICAL IMPRESSIONS 07/05/2015 Therapy Diagnosis Mild oral phase dysphagia;Moderate pharyngeal phase dysphagia Clinical Impression Pt has a mild-moderate oropharyngeal dysphagia with prolonged intubation and generalized deconditioning resulting in sensorimotor deficits. Oral phase is mildly prolonged with reduced bolus cohesion but ultimately with adequate oral clearance. Thin liquids reach the pyriform sinuses prior to swallow  trigger and are subsequently aspirated before the swallow. This does elicit a reflexive cough, but it is not sufficient to expel aspirates. Min cues for smaller boluses of thin liquids results in silent penetration, which does clear with cued throat clearing before it can fall beneath the glottis. He has good airway protection with nectar thick liquids and solids, despite larger quantities. Reduced hyolaryngeal movement, epiglottic inversion, and base of tongue retraction result in moderate pharyngeal residue, but this is reduced with Min cues for second swallows. Given the above, would recommend more conservative diet of Dys 2 textures and nectar thick liquids. Good prognosis for diet advancement with increased time post-extubation and improved overall strength. Impact on safety and function Mild aspiration risk   CHL IP TREATMENT RECOMMENDATION 07/05/2015 Treatment Recommendations Therapy as outlined in treatment plan below   Prognosis 07/05/2015 Prognosis for Safe Diet Advancement Good Barriers to Reach Goals -- Barriers/Prognosis Comment -- CHL IP DIET RECOMMENDATION 07/05/2015 SLP Diet Recommendations Dysphagia 2 (Fine chop) solids;Nectar thick liquid Liquid Administration via Cup;Straw Medication Administration Crushed with puree Compensations Minimize environmental distractions;Slow rate;Small sips/bites;Multiple dry swallows after each bite/sip Postural Changes Remain semi-upright after after feeds/meals (Comment);Seated upright at 90 degrees   CHL IP OTHER RECOMMENDATIONS 07/05/2015 Recommended Consults -- Oral Care Recommendations Oral care BID Other Recommendations Order thickener from pharmacy;Prohibited food (jello, ice cream, thin soups);Remove water pitcher   CHL IP FOLLOW UP RECOMMENDATIONS 07/05/2015 Follow up Recommendations LTACH   CHL IP FREQUENCY AND DURATION 07/05/2015 Speech Therapy Frequency (ACUTE ONLY) min 2x/week Treatment Duration 2 weeks      CHL IP ORAL PHASE 07/05/2015 Oral Phase Impaired  Oral - Pudding  Teaspoon -- Oral - Pudding Cup -- Oral - Honey Teaspoon -- Oral - Honey Cup -- Oral - Nectar Teaspoon Delayed oral transit;Decreased bolus cohesion Oral - Nectar Cup Delayed oral transit;Decreased bolus cohesion Oral - Nectar Straw Delayed oral transit;Decreased bolus cohesion Oral - Thin Teaspoon Delayed oral transit;Decreased bolus cohesion Oral - Thin Cup Delayed oral transit;Decreased bolus cohesion Oral - Thin Straw -- Oral - Puree Delayed oral transit;Decreased bolus cohesion Oral - Mech Soft Delayed oral transit;Decreased bolus cohesion;Impaired mastication Oral - Regular -- Oral - Multi-Consistency -- Oral - Pill -- Oral Phase - Comment --  CHL IP PHARYNGEAL PHASE 07/05/2015 Pharyngeal Phase Impaired Pharyngeal- Pudding Teaspoon -- Pharyngeal -- Pharyngeal- Pudding Cup -- Pharyngeal -- Pharyngeal- Honey Teaspoon -- Pharyngeal -- Pharyngeal- Honey Cup -- Pharyngeal -- Pharyngeal- Nectar Teaspoon Delayed swallow initiation-vallecula;Reduced epiglottic inversion;Reduced anterior laryngeal mobility;Reduced laryngeal elevation;Reduced tongue base retraction;Pharyngeal residue - valleculae Pharyngeal -- Pharyngeal- Nectar Cup Delayed swallow initiation-vallecula;Reduced epiglottic inversion;Reduced anterior laryngeal mobility;Reduced laryngeal elevation;Reduced tongue base retraction;Pharyngeal residue - valleculae Pharyngeal -- Pharyngeal- Nectar Straw Delayed swallow initiation-vallecula;Reduced epiglottic inversion;Reduced anterior laryngeal mobility;Reduced laryngeal elevation;Reduced tongue base retraction;Pharyngeal residue - valleculae Pharyngeal -- Pharyngeal- Thin Teaspoon Delayed swallow initiation-vallecula;Reduced epiglottic inversion;Reduced anterior laryngeal mobility;Reduced laryngeal elevation;Reduced tongue base retraction;Pharyngeal residue - valleculae Pharyngeal -- Pharyngeal- Thin Cup Reduced epiglottic inversion;Reduced anterior laryngeal mobility;Reduced laryngeal  elevation;Reduced tongue base retraction;Pharyngeal residue - valleculae;Delayed swallow initiation-pyriform sinuses;Penetration/Aspiration before swallow Pharyngeal Material enters airway, passes BELOW cords and not ejected out despite cough attempt by patient Pharyngeal- Thin Straw -- Pharyngeal -- Pharyngeal- Puree Delayed swallow initiation-vallecula;Reduced epiglottic inversion;Reduced anterior laryngeal mobility;Reduced laryngeal elevation;Reduced tongue base retraction;Pharyngeal residue - valleculae Pharyngeal -- Pharyngeal- Mechanical Soft Delayed swallow initiation-vallecula;Reduced epiglottic inversion;Reduced anterior laryngeal mobility;Reduced laryngeal elevation;Reduced tongue base retraction;Pharyngeal residue - valleculae Pharyngeal -- Pharyngeal- Regular -- Pharyngeal -- Pharyngeal- Multi-consistency -- Pharyngeal -- Pharyngeal- Pill -- Pharyngeal -- Pharyngeal Comment --  CHL IP CERVICAL ESOPHAGEAL PHASE 07/05/2015 Cervical Esophageal Phase WFL Pudding Teaspoon -- Pudding Cup -- Honey Teaspoon -- Honey Cup -- Nectar Teaspoon -- Nectar Cup -- Nectar Straw -- Thin Teaspoon -- Thin Cup -- Thin Straw -- Puree -- Mechanical Soft -- Regular -- Multi-consistency -- Pill -- Cervical Esophageal Comment -- No flowsheet data found. Germain Osgood, M.A. CCC-SLP [phone] Germain Osgood 07/05/2015, 12:06 PM              Microbiology: Recent Results (from the past 240 hour(s))  Surgical pcr screen     Status: None   Collection Time: 07/24/15 10:14 PM  Result Value Ref Range Status   MRSA, PCR NEGATIVE NEGATIVE Final   Staphylococcus aureus NEGATIVE NEGATIVE Final    Comment:        The Xpert SA Assay (FDA approved for NASAL specimens in patients over 54 years of age), is one component of a comprehensive surveillance program.  Test performance has been validated by [facility] for patients greater than or equal to 89 year old. It is not intended to diagnose infection nor to guide or  monitor treatment.    Labs: CBC:  Recent Labs Lab 07/26/15 0800 07/27/15 0830 07/28/15 0553 07/29/15 0303 07/30/15 0533  WBC 5.4 5.3 5.5 6.8 6.9  NEUTROABS  --   --  3.6  --   --   HGB 8.0* 8.4* 9.5* 8.4* 9.1*  HCT 27.5* 29.4* 31.8* 27.8* 30.4*  MCV 92.3 91.3 91.9 90.0 89.9  PLT 207 232 224 240 [id number]   Basic Metabolic Panel:  Recent Labs Lab 07/25/15 0511 07/26/15  0800 07/27/15 0830 07/28/15 0553 07/29/15 0303  NA 132* 133* 135 135 133*  K 3.9 3.9 3.5 3.9 3.5  CL 99* 100* 101 98* 101  CO2 23 24 26 28 22   GLUCOSE 76 71 101* 106* 125*  BUN 43* 55* 44* 30* 47*  CREATININE 3.92* 4.86* 3.49* 2.84* 3.39*  CALCIUM 7.9* 8.1* 7.9* 8.1* 7.9*  MG  --   --   --  1.8 1.7  PHOS 3.1 4.2 3.0  --   --    Liver Function Tests:  Recent Labs Lab 07/25/15 0511 07/26/15 0800 07/27/15 0830 07/28/15 0553  AST  --   --   --  42*  ALT  --   --   --  34  ALKPHOS  --   --   --  239*  BILITOT  --   --   --  0.5  PROT  --   --   --  4.9*  ALBUMIN 1.5* 1.6* 1.5* 1.5*   CBG:  Recent Labs Lab 07/30/15 1200 07/30/15 1647 07/30/15 2138 07/31/15 0736 07/31/15 1124  GLUCAP 115* 74 106* 72 117*   Time spent: 35 minutes  Signed:  PATEL, PRANAV  [facility]  07/31/2015  , 12:10 PM

## 2015-07-31 NOTE — Progress Notes (Signed)
ANTICOAGULATION CONSULT NOTE - Follow Up Consult  Pharmacy Consult for Coumadin/Lovenox Indication: Atrial fibrillation and hx PE and DVT, new right leg DVT 06/16/15  No Known Allergies  Patient Measurements: Height: 5' 9.5" (176.5 cm) Weight: 241 lb 10 oz (109.6 kg) IBW/kg (Calculated) : 71.85  Vital Signs: Temp: 98 F (36.7 C) (06/25 0540) Temp Source: Oral (06/25 0540) BP: 121/83 mmHg (06/25 0540) Pulse Rate: 84 (06/25 0540)  Labs:  Recent Labs  07/29/15 0303 07/29/15 1045 07/29/15 1906 07/30/15 0533 07/31/15 0458  HGB 8.4*  --   --  9.1*  --   HCT 27.8*  --   --  30.4*  --   PLT 240  --   --  214  --   LABPROT 22.1*  --   --  21.9* 22.3*  INR 1.95*  --   --  1.93* 1.97*  HEPARINUNFRC  --  0.37 0.25* 0.39  --   CREATININE 3.39*  --   --   --   --     Estimated Creatinine Clearance: 23.9 mL/min (by C-G formula based on Cr of 3.39).   Assessment: 82 YOM on warfarin PTA for AFib + hx DVT/PE however patient also found to have an acute DVT in R leg (gastric vein and indeterminate age fem and pop veins) per dopplers 5/11. Admitted 4/25 with R peirnephric hematoma/hematuria and BOO of solitary kidney on 4/24. Admit INR 8 on PTA dose. Received several dose of vitamin K between 4/26 and 06/14/15. 5/10 HIT panel neg, SRA neg. Warfarin held 6/15 for AVF placement, done 6/19. Warf resumed 6/20.  Now discontinuing Heparin Drip and switching to lovenox 1.5mg /kg daily per MD request in anticipation of D/C on 6/25.  INR today 1.97. H/H stable, no bleeding noted   Goal of Therapy:  INR 2-3 Monitor platelets by anticoagulation protocol: Yes   Plan:  - Lovenox 165mg  Q24h (1.5mg /kg) per MD request until INR >2 - Repeat Warfarin 5 mg x 1 dose at 1800 today - F/u INR - F/u Dispo: [employer] when he can tolerate HD in chair (hopefully 6/25)  Janet Humphreys C. Lennox Grumbles, PharmD Pharmacy Resident  Pager: [phone] 07/31/2015 8:06 AM

## 2015-08-07 ENCOUNTER — Encounter (HOSPITAL_COMMUNITY): Payer: Self-pay | Admitting: *Deleted

## 2015-08-07 ENCOUNTER — Emergency Department (HOSPITAL_COMMUNITY): Payer: BLUE CROSS/BLUE SHIELD

## 2015-08-07 ENCOUNTER — Inpatient Hospital Stay (HOSPITAL_COMMUNITY)
Admission: EM | Admit: 2015-08-07 | Discharge: 2015-08-23 | DRG: 871 | Disposition: A | Discharge status: Skilled Nursing Facility | Payer: BLUE CROSS/BLUE SHIELD | Attending: Internal Medicine | Admitting: Internal Medicine

## 2015-08-07 DIAGNOSIS — Z794 Long term (current) use of insulin: Secondary | ICD-10-CM | POA: Diagnosis not present

## 2015-08-07 DIAGNOSIS — D6832 Hemorrhagic disorder due to extrinsic circulating anticoagulants: Secondary | ICD-10-CM | POA: Diagnosis present

## 2015-08-07 DIAGNOSIS — T45515A Adverse effect of anticoagulants, initial encounter: Secondary | ICD-10-CM | POA: Diagnosis present

## 2015-08-07 DIAGNOSIS — R532 Functional quadriplegia: Secondary | ICD-10-CM | POA: Diagnosis present

## 2015-08-07 DIAGNOSIS — G8929 Other chronic pain: Secondary | ICD-10-CM | POA: Diagnosis present

## 2015-08-07 DIAGNOSIS — J9621 Acute and chronic respiratory failure with hypoxia: Secondary | ICD-10-CM | POA: Diagnosis present

## 2015-08-07 DIAGNOSIS — I4891 Unspecified atrial fibrillation: Secondary | ICD-10-CM | POA: Diagnosis present

## 2015-08-07 DIAGNOSIS — E872 Acidosis: Secondary | ICD-10-CM | POA: Diagnosis not present

## 2015-08-07 DIAGNOSIS — N186 End stage renal disease: Secondary | ICD-10-CM | POA: Diagnosis present

## 2015-08-07 DIAGNOSIS — E669 Obesity, unspecified: Secondary | ICD-10-CM | POA: Diagnosis present

## 2015-08-07 DIAGNOSIS — Z923 Personal history of irradiation: Secondary | ICD-10-CM | POA: Diagnosis not present

## 2015-08-07 DIAGNOSIS — Z86718 Personal history of other venous thrombosis and embolism: Secondary | ICD-10-CM

## 2015-08-07 DIAGNOSIS — R6521 Severe sepsis with septic shock: Secondary | ICD-10-CM | POA: Diagnosis present

## 2015-08-07 DIAGNOSIS — J189 Pneumonia, unspecified organism: Secondary | ICD-10-CM | POA: Insufficient documentation

## 2015-08-07 DIAGNOSIS — Z7401 Bed confinement status: Secondary | ICD-10-CM | POA: Diagnosis not present

## 2015-08-07 DIAGNOSIS — E871 Hypo-osmolality and hyponatremia: Secondary | ICD-10-CM | POA: Diagnosis present

## 2015-08-07 DIAGNOSIS — E785 Hyperlipidemia, unspecified: Secondary | ICD-10-CM | POA: Diagnosis present

## 2015-08-07 DIAGNOSIS — I248 Other forms of acute ischemic heart disease: Secondary | ICD-10-CM | POA: Diagnosis present

## 2015-08-07 DIAGNOSIS — R159 Full incontinence of feces: Secondary | ICD-10-CM | POA: Diagnosis present

## 2015-08-07 DIAGNOSIS — Z515 Encounter for palliative care: Secondary | ICD-10-CM | POA: Diagnosis present

## 2015-08-07 DIAGNOSIS — R6520 Severe sepsis without septic shock: Secondary | ICD-10-CM

## 2015-08-07 DIAGNOSIS — Z7901 Long term (current) use of anticoagulants: Secondary | ICD-10-CM | POA: Diagnosis not present

## 2015-08-07 DIAGNOSIS — A4159 Other Gram-negative sepsis: Principal | ICD-10-CM | POA: Diagnosis present

## 2015-08-07 DIAGNOSIS — R195 Other fecal abnormalities: Secondary | ICD-10-CM | POA: Diagnosis present

## 2015-08-07 DIAGNOSIS — N189 Chronic kidney disease, unspecified: Secondary | ICD-10-CM | POA: Insufficient documentation

## 2015-08-07 DIAGNOSIS — L89152 Pressure ulcer of sacral region, stage 2: Secondary | ICD-10-CM | POA: Diagnosis present

## 2015-08-07 DIAGNOSIS — G4733 Obstructive sleep apnea (adult) (pediatric): Secondary | ICD-10-CM | POA: Diagnosis present

## 2015-08-07 DIAGNOSIS — D6489 Other specified anemias: Secondary | ICD-10-CM | POA: Diagnosis present

## 2015-08-07 DIAGNOSIS — Z992 Dependence on renal dialysis: Secondary | ICD-10-CM

## 2015-08-07 DIAGNOSIS — Z7189 Other specified counseling: Secondary | ICD-10-CM | POA: Insufficient documentation

## 2015-08-07 DIAGNOSIS — Z6835 Body mass index (BMI) 35.0-35.9, adult: Secondary | ICD-10-CM

## 2015-08-07 DIAGNOSIS — Z66 Do not resuscitate: Secondary | ICD-10-CM | POA: Diagnosis present

## 2015-08-07 DIAGNOSIS — M899 Disorder of bone, unspecified: Secondary | ICD-10-CM | POA: Diagnosis present

## 2015-08-07 DIAGNOSIS — J150 Pneumonia due to Klebsiella pneumoniae: Secondary | ICD-10-CM | POA: Diagnosis present

## 2015-08-07 DIAGNOSIS — Y95 Nosocomial condition: Secondary | ICD-10-CM | POA: Diagnosis present

## 2015-08-07 DIAGNOSIS — R531 Weakness: Secondary | ICD-10-CM | POA: Diagnosis present

## 2015-08-07 DIAGNOSIS — L8990 Pressure ulcer of unspecified site, unspecified stage: Secondary | ICD-10-CM | POA: Insufficient documentation

## 2015-08-07 DIAGNOSIS — Z79899 Other long term (current) drug therapy: Secondary | ICD-10-CM

## 2015-08-07 DIAGNOSIS — I120 Hypertensive chronic kidney disease with stage 5 chronic kidney disease or end stage renal disease: Secondary | ICD-10-CM | POA: Diagnosis present

## 2015-08-07 DIAGNOSIS — R778 Other specified abnormalities of plasma proteins: Secondary | ICD-10-CM | POA: Insufficient documentation

## 2015-08-07 DIAGNOSIS — D638 Anemia in other chronic diseases classified elsewhere: Secondary | ICD-10-CM | POA: Diagnosis present

## 2015-08-07 DIAGNOSIS — A419 Sepsis, unspecified organism: Secondary | ICD-10-CM | POA: Diagnosis present

## 2015-08-07 DIAGNOSIS — G9341 Metabolic encephalopathy: Secondary | ICD-10-CM | POA: Diagnosis present

## 2015-08-07 DIAGNOSIS — Z9049 Acquired absence of other specified parts of digestive tract: Secondary | ICD-10-CM

## 2015-08-07 DIAGNOSIS — R7989 Other specified abnormal findings of blood chemistry: Secondary | ICD-10-CM | POA: Insufficient documentation

## 2015-08-07 DIAGNOSIS — Z905 Acquired absence of kidney: Secondary | ICD-10-CM | POA: Diagnosis not present

## 2015-08-07 DIAGNOSIS — Z86711 Personal history of pulmonary embolism: Secondary | ICD-10-CM

## 2015-08-07 DIAGNOSIS — J9601 Acute respiratory failure with hypoxia: Secondary | ICD-10-CM

## 2015-08-07 DIAGNOSIS — N2581 Secondary hyperparathyroidism of renal origin: Secondary | ICD-10-CM | POA: Diagnosis present

## 2015-08-07 DIAGNOSIS — E8809 Other disorders of plasma-protein metabolism, not elsewhere classified: Secondary | ICD-10-CM | POA: Diagnosis present

## 2015-08-07 DIAGNOSIS — T82818A Embolism of vascular prosthetic devices, implants and grafts, initial encounter: Secondary | ICD-10-CM | POA: Diagnosis present

## 2015-08-07 DIAGNOSIS — E1122 Type 2 diabetes mellitus with diabetic chronic kidney disease: Secondary | ICD-10-CM | POA: Diagnosis present

## 2015-08-07 DIAGNOSIS — R1110 Vomiting, unspecified: Secondary | ICD-10-CM

## 2015-08-07 DIAGNOSIS — Z8546 Personal history of malignant neoplasm of prostate: Secondary | ICD-10-CM

## 2015-08-07 DIAGNOSIS — Z8674 Personal history of sudden cardiac arrest: Secondary | ICD-10-CM

## 2015-08-07 DIAGNOSIS — N390 Urinary tract infection, site not specified: Secondary | ICD-10-CM | POA: Diagnosis present

## 2015-08-07 DIAGNOSIS — J811 Chronic pulmonary edema: Secondary | ICD-10-CM

## 2015-08-07 LAB — I-STAT CG4 LACTIC ACID, ED
LACTIC ACID, VENOUS: 2.01 mmol/L — AB (ref 0.5–1.9)
LACTIC ACID, VENOUS: 1.74 mmol/L (ref 0.5–1.9)

## 2015-08-07 LAB — COMPREHENSIVE METABOLIC PANEL
ALBUMIN: 1.5 g/dL — AB (ref 3.5–5.0)
ALT: 20 U/L (ref 17–63)
ANION GAP: 12 (ref 5–15)
AST: 29 U/L (ref 15–41)
Alkaline Phosphatase: 115 U/L (ref 38–126)
BUN: 55 mg/dL — ABNORMAL HIGH (ref 6–20)
CO2: 24 mmol/L (ref 22–32)
Calcium: 7.9 mg/dL — ABNORMAL LOW (ref 8.9–10.3)
Chloride: 99 mmol/L — ABNORMAL LOW (ref 101–111)
Creatinine, Ser: 3.76 mg/dL — ABNORMAL HIGH (ref 0.61–1.24)
GFR calc non Af Amer: 15 mL/min — ABNORMAL LOW (ref >60)
GFR, EST AFRICAN AMERICAN: 17 mL/min — AB (ref >60)
GLUCOSE: 120 mg/dL — AB (ref 65–99)
POTASSIUM: 4.8 mmol/L (ref 3.5–5.1)
SODIUM: 135 mmol/L (ref 135–145)
TOTAL PROTEIN: 5.2 g/dL — AB (ref 6.5–8.1)
Total Bilirubin: 0.8 mg/dL (ref 0.3–1.2)

## 2015-08-07 LAB — TROPONIN I: Troponin I: 0.05 ng/mL (ref <0.03)

## 2015-08-07 LAB — I-STAT ARTERIAL BLOOD GAS, ED
ACID-BASE EXCESS: 2 mmol/L (ref 0.0–2.0)
BICARBONATE: 24.7 meq/L — AB (ref 20.0–24.0)
O2 SAT: 94 %
PO2 ART: 62 mmHg — AB (ref 80.0–100.0)
Patient temperature: 98
TCO2: 26 mmol/L (ref 0–100)
pCO2 arterial: 30.2 mmHg — ABNORMAL LOW (ref 35.0–45.0)
pH, Arterial: 7.519 — ABNORMAL HIGH (ref 7.350–7.450)

## 2015-08-07 LAB — GRAM STAIN

## 2015-08-07 LAB — I-STAT CHEM 8, ED
BUN: 60 mg/dL — ABNORMAL HIGH (ref 6–20)
CREATININE: 3.8 mg/dL — AB (ref 0.61–1.24)
Calcium, Ion: 0.96 mmol/L — ABNORMAL LOW (ref 1.12–1.23)
Chloride: 98 mmol/L — ABNORMAL LOW (ref 101–111)
GLUCOSE: 117 mg/dL — AB (ref 65–99)
HCT: 31 % — ABNORMAL LOW (ref 39.0–52.0)
HEMOGLOBIN: 10.5 g/dL — AB (ref 13.0–17.0)
Potassium: 4.7 mmol/L (ref 3.5–5.1)
Sodium: 133 mmol/L — ABNORMAL LOW (ref 135–145)
TCO2: 26 mmol/L (ref 0–100)

## 2015-08-07 LAB — CBC
HCT: 32.3 % — ABNORMAL LOW (ref 39.0–52.0)
HCT: 28.6 % — ABNORMAL LOW (ref 39.0–52.0)
HEMOGLOBIN: 9.9 g/dL — AB (ref 13.0–17.0)
Hemoglobin: 8.8 g/dL — ABNORMAL LOW (ref 13.0–17.0)
MCH: 27.3 pg (ref 26.0–34.0)
MCH: 27.7 pg (ref 26.0–34.0)
MCHC: 30.7 g/dL (ref 30.0–36.0)
MCHC: 30.8 g/dL (ref 30.0–36.0)
MCV: 89.2 fL (ref 78.0–100.0)
MCV: 89.9 fL (ref 78.0–100.0)
PLATELETS: 297 10*3/uL (ref 150–400)
Platelets: 323 10*3/uL (ref 150–400)
RBC: 3.62 MIL/uL — ABNORMAL LOW (ref 4.22–5.81)
RBC: 3.18 MIL/uL — AB (ref 4.22–5.81)
RDW: 17.3 % — AB (ref 11.5–15.5)
RDW: 17.5 % — AB (ref 11.5–15.5)
WBC: 8.3 10*3/uL (ref 4.0–10.5)
WBC: 6.7 10*3/uL (ref 4.0–10.5)

## 2015-08-07 LAB — URINE MICROSCOPIC-ADD ON

## 2015-08-07 LAB — URINALYSIS, ROUTINE W REFLEX MICROSCOPIC
GLUCOSE, UA: NEGATIVE mg/dL
KETONES UR: NEGATIVE mg/dL
Nitrite: NEGATIVE
PROTEIN: 100 mg/dL — AB
Specific Gravity, Urine: 1.019 (ref 1.005–1.030)
pH: 8 (ref 5.0–8.0)

## 2015-08-07 LAB — POC OCCULT BLOOD, ED: Fecal Occult Bld: POSITIVE — AB

## 2015-08-07 LAB — PROTIME-INR
INR: 4.75 — AB (ref 0.00–1.49)
PROTHROMBIN TIME: 43.2 s — AB (ref 11.6–15.2)

## 2015-08-07 LAB — BASIC METABOLIC PANEL
BUN: 55 mg/dL — AB (ref 4–21)
Creatinine: 3.8 mg/dL — AB (ref 0.6–1.3)
Glucose: 120 mg/dL
Potassium: 4.8 mmol/L (ref 3.4–5.3)
Sodium: 135 mmol/L — AB (ref 137–147)

## 2015-08-07 LAB — CBC AND DIFFERENTIAL
HCT: 32 % — AB (ref 41–53)
HEMOGLOBIN: 9.9 g/dL — AB (ref 13.5–17.5)
PLATELETS: 323 10*3/uL (ref 150–399)
WBC: 8.3 10*3/mL

## 2015-08-07 LAB — BRAIN NATRIURETIC PEPTIDE: B Natriuretic Peptide: 393.4 pg/mL — ABNORMAL HIGH (ref 0.0–100.0)

## 2015-08-07 LAB — POCT INR: INR: 4.8 — AB (ref 0.9–1.1)

## 2015-08-07 LAB — CBG MONITORING, ED: GLUCOSE-CAPILLARY: 111 mg/dL — AB (ref 65–99)

## 2015-08-07 LAB — HEPATIC FUNCTION PANEL: Bilirubin, Total: 0.8 mg/dL

## 2015-08-07 MED ORDER — ALBUMIN HUMAN 5 % IV SOLN
50.0000 g | Freq: Once | INTRAVENOUS | Status: DC
  Filled 2015-08-07: qty 1000

## 2015-08-07 MED ORDER — VANCOMYCIN HCL 10 G IV SOLR
1500.0000 mg | Freq: Once | INTRAVENOUS | Status: AC
  Administered 2015-08-07: 1500 mg via INTRAVENOUS
  Filled 2015-08-07: qty 1500

## 2015-08-07 MED ORDER — DEXTROSE 5 % IV SOLN
1.0000 g | INTRAVENOUS | Status: DC
  Administered 2015-08-08: 1 g via INTRAVENOUS
  Filled 2015-08-07: qty 1

## 2015-08-07 MED ORDER — SODIUM CHLORIDE 0.9 % IV BOLUS (SEPSIS)
500.0000 mL | Freq: Once | INTRAVENOUS | Status: AC
  Administered 2015-08-07: 500 mL via INTRAVENOUS

## 2015-08-07 MED ORDER — SODIUM CHLORIDE 0.9 % IV BOLUS (SEPSIS)
2000.0000 mL | Freq: Once | INTRAVENOUS | Status: AC
  Administered 2015-08-07: 2000 mL via INTRAVENOUS

## 2015-08-07 MED ORDER — PIPERACILLIN-TAZOBACTAM 3.375 G IVPB 30 MIN
3.3750 g | Freq: Once | INTRAVENOUS | Status: AC
  Administered 2015-08-07: 3.375 g via INTRAVENOUS
  Filled 2015-08-07: qty 50

## 2015-08-07 MED ORDER — ALBUMIN HUMAN 25 % IV SOLN
50.0000 g | Freq: Once | INTRAVENOUS | Status: AC
  Administered 2015-08-07: 50 g via INTRAVENOUS
  Filled 2015-08-07 (×2): qty 200

## 2015-08-07 NOTE — Progress Notes (Addendum)
Pharmacy Antibiotic Note  [name] is a 75 y.o. male admitted on 08/07/2015 with sepsis and UTI.  Pharmacy has been consulted for vancomycin and cefepime dosing.  Plan: Vancomycin 1500mg  IV every 48 hours.  Goal trough 15-20 mcg/mL. Cefepime 1g IV Q24H.   Temp (24hrs), Avg:98.7 F (37.1 C), Min:98 F (36.7 C), Max:99.3 F (37.4 C)   Recent Labs Lab 08/07/15 1957 08/07/15 2007 08/07/15 2008  WBC 8.3  --   --   CREATININE 3.76*  --  3.80*  LATICACIDVEN  --  2.01*  --     Estimated Creatinine Clearance: 21.3 mL/min (by C-G formula based on Cr of 3.8).    No Known Allergies   Thank you for allowing pharmacy to be a part of this patient's care.  Wynona Neat, PharmD, BCPS  08/07/2015 11:01 PM   Addendum: Patient is ESRD.  Will add vancomycin 1g x 1 to complete total loading dose of 2500 mg.  Then will need re-dose after next HD session.  Uvaldo Rising, BCPS  Clinical Pharmacist Pager [phone]  08/08/2015 11:06 AM

## 2015-08-07 NOTE — ED Notes (Signed)
Reported critical troponin to Dr Liston Alba, also aware of istat lactic result.

## 2015-08-07 NOTE — ED Provider Notes (Addendum)
CSN: [id number]     Arrival date & time 08/07/15  1948 History   First MD Initiated Contact with Patient 08/07/15 1953     Chief Complaint  Patient presents with  . Fatigue  . Altered Mental Status   (Consider location/radiation/quality/duration/timing/severity/associated sxs/prior Treatment) Patient is a 75 y.o. male presenting with fever. The history is provided by the spouse and the patient.  Fever Max temp prior to arrival:  100.4 Temp source:  Oral Severity:  Moderate Onset quality:  Sudden Duration:  4 hours Timing:  Constant Chronicity:  Recurrent Relieved by:  None tried Worsened by:  Nothing tried Associated symptoms: vomiting   Associated symptoms: no chest pain, no chills, no confusion, no congestion, no cough, no diarrhea, no nausea, no rash and no sore throat     Past Medical History  Diagnosis Date  . Obesity   . Phlebitis     Lower extermity  . Pulmonary emboli ([place]) 2008    submassive, saddle  . Prostate cancer ([place]) 07/2009  . Sleep apnea     on CPAP  . Hx of echocardiogram 12/04/2010    Normal EF >55% no significant valve disease  . History of stress test 06/27/2009    Low risk and EF of approximately 50%  . DVT (deep venous thrombosis) ([place])   . Chronic kidney disease, stage 3     baseline creatinine ~1.4  . HLD (hyperlipidemia)   . HTN (hypertension)   . Dysrhythmia     A fib  . Diabetes mellitus ([place])     diet controlled  . History of hiatal hernia    Past Surgical History  Procedure Laterality Date  . Nephrectomy  1999    for CA  . Cholecystectomy  1999  . Transurethral resection of bladder tumor N/A 03/04/2013    Procedure: CYSTOSCOPY WITH RIGHT RETROGRADE PYELOGRAM AND BLADDER BIOPSY /CLOT EVACUATION/ BIOPSY PROSTATIC URETHRA WITH FULGERATION ;  Surgeon: Molli Hazard, MD;  Location: [facility];  Service: Urology;  Laterality: N/A;  . Cystoscopy w/ retrogrades N/A 04/08/2013    Procedure: CYSTOSCOPY WITH CLOT EVACUATION;  Surgeon: Molli Hazard, MD;  Location: [facility];  Service: Urology;  Laterality: N/A;  . Left shoulder repair    . Shoulder open rotator cuff repair Right 05/03/2015    Procedure: RIGHT SHOULDER ROTATOR CUFF REPAIR OPEN WITH GRAFT AND ANCHOR ;  Surgeon: Latanya Maudlin, MD;  Location: [facility];  Service: Orthopedics;  Laterality: Right;  . Av fistula placement Left 07/25/2015    Procedure: ARTERIOVENOUS (AV) FISTULA CREATION;  Surgeon: Elam Dutch, MD;  Location: [facility];  Service: Vascular;  Laterality: Left;  Axillary block with minimal sedation and supplemental local at operative site.   Family History  Problem Relation Age of Onset  . Cancer Mother   . Diabetes Father   . Cancer Father    Social History  Substance Use Topics  . Smoking status: Never Smoker   . Smokeless tobacco: Never Used  . Alcohol Use: 0.0 oz/week    0 Standard drinks or equivalent per week     Comment: occasional beer or wine    Review of Systems  Constitutional: Positive for fever. Negative for chills.  HENT: Negative for congestion and sore throat.   Eyes: Negative for pain.  Respiratory: Negative for cough and shortness of breath.   Cardiovascular: Negative for chest pain and palpitations.  Gastrointestinal: Positive for vomiting. Negative for nausea, abdominal pain and diarrhea.  Loose stools  Endocrine: Negative.   Genitourinary: Negative for flank pain.  Musculoskeletal: Negative for back pain and neck pain.  Skin: Negative for rash.  Allergic/Immunologic: Negative.   Neurological: Negative for dizziness, syncope and light-headedness.  Psychiatric/Behavioral: Negative for confusion.   Allergies  Review of patient's allergies indicates no known allergies.  Home Medications   Prior to Admission medications   Medication Sig Start Date End Date Taking? Authorizing Provider  Amino Acids-Protein Hydrolys (FEEDING SUPPLEMENT, PRO-STAT SUGAR FREE 64,) LIQD Take 30 mLs by mouth 3 (three) times daily with  meals. 07/31/15  Yes Lavina Hamman, MD  fenofibrate (TRICOR) 145 MG tablet Take 145 mg by mouth daily.  10/18/12  Yes Historical Provider, MD  ferrous sulfate 325 (65 FE) MG EC tablet take 1 tablet by mouth once daily 01/17/15  Yes Troy Sine, MD  guaiFENesin (ROBITUSSIN) 100 MG/5ML SOLN Take 10 mLs (200 mg total) by mouth every 8 (eight) hours. 07/31/15  Yes Lavina Hamman, MD  HYDROcodone-acetaminophen (NORCO/VICODIN) 5-325 MG tablet Take 1 tablet by mouth every 8 (eight) hours as needed for moderate pain or severe pain (breakthrough pain). Patient taking differently: Take 1 tablet by mouth every 8 (eight) hours as needed for moderate pain.  07/31/15  Yes Lavina Hamman, MD  insulin aspart (NOVOLOG) 100 UNIT/ML injection CBG 121 - 150:2u, CBG 151 - 200:3u, CBG 201 - 250:5u, CBG 251 - 300:8u,CBG 301 - 350:11u, CBG 351 - 400:15u Patient taking differently: Inject 2-15 Units into the skin 3 (three) times daily with meals. CBG 121-150 2 units, 151-200 3 units, 201-250 5 units, 251-300 8 units, 301-350 11 units, 351-400 15 units 07/31/15  Yes Lavina Hamman, MD  ipratropium-albuterol (DUONEB) 0.5-2.5 (3) MG/3ML SOLN Take 3 mLs by nebulization every 4 (four) hours as needed (shortness of breath/ wheezing).   Yes Historical Provider, MD  LORazepam (ATIVAN) 1 MG tablet Take 1 tablet (1 mg total) by mouth at bedtime. 07/31/15  Yes Lavina Hamman, MD  menthol-cetylpyridinium (CEPACOL) 3 MG lozenge Take 1 lozenge (3 mg total) by mouth as needed for sore throat. 07/31/15  Yes Lavina Hamman, MD  midodrine (PROAMATINE) 10 MG tablet Take 1 tablet (10 mg total) by mouth 3 (three) times daily before meals. Patient taking differently: Take 10 mg by mouth 3 (three) times daily. 6:30am, 11:30am, 16:30pm 07/31/15  Yes Lavina Hamman, MD  multivitamin (RENA-VIT) TABS tablet Take 1 tablet by mouth at bedtime. 07/31/15  Yes Lavina Hamman, MD  Nutritional Supplements (NUTRITIONAL SUPPLEMENT PO) Take 120 mLs by mouth every  evening. Med Plus 2.0   Yes Historical Provider, MD  nystatin (MYCOSTATIN) 100000 UNIT/ML suspension Take 5 mLs (500,000 Units total) by mouth 4 (four) times daily. Patient taking differently: Take 5 mLs by mouth 4 (four) times daily. For thrush - swish and swallow 07/31/15 08/12/15 Yes Lavina Hamman, MD  ondansetron (ZOFRAN) 4 MG tablet Take 4 mg by mouth every 6 (six) hours as needed for nausea or vomiting.   Yes Historical Provider, MD  pantoprazole (PROTONIX) 40 MG tablet Take 1 tablet (40 mg total) by mouth 2 (two) times daily before a meal. After 08/12/15 take once a day 07/31/15 08/11/15 Yes Lavina Hamman, MD  polyvinyl alcohol (LIQUIFILM TEARS) 1.4 % ophthalmic solution Place 1 drop into both eyes 2 (two) times daily as needed for dry eyes.   Yes Historical Provider, MD  PRESCRIPTION MEDICATION Inhale into the lungs at bedtime. CPAP  Yes Historical Provider, MD  Maltodextrin-Xanthan Gum (RESOURCE THICKENUP CLEAR) POWD Take 1 g by mouth as needed. 07/31/15   Lavina Hamman, MD  Nutritional Supplements (FEEDING SUPPLEMENT, NEPRO CARB STEADY,) LIQD Take 237 mLs by mouth daily at 3 pm. 07/31/15   Lavina Hamman, MD  warfarin (COUMADIN) 5 MG tablet Take 1 tablet (5 mg total) by mouth one time only at 6 PM. Patient taking differently: Take 5 mg by mouth every evening.  08/01/15   Lavina Hamman, MD   BP 83/60 mmHg  Pulse 98  Temp(Src) 99.3 F (37.4 C) (Rectal)  Resp 21  SpO2 89% Physical Exam  Constitutional: He is oriented to person, place, and time. He appears well-developed and well-nourished. He appears listless. He appears toxic. He appears distressed.  HENT:  Head: Normocephalic and atraumatic.  Eyes: Conjunctivae and EOM are normal. Pupils are equal, round, and reactive to light.  Neck: Normal range of motion. Neck supple.  Cardiovascular: Normal rate, regular rhythm, normal heart sounds and intact distal pulses.   Pulmonary/Chest: Effort normal and breath sounds normal. No respiratory  distress.  Abdominal: Soft. Bowel sounds are normal. There is no tenderness.  Musculoskeletal: Normal range of motion.  Neurological: He is oriented to person, place, and time. He has normal reflexes. He appears listless. No cranial nerve deficit.  Skin: Skin is warm and dry.    ED Course  .Critical Care Performed by: Varney Biles Authorized by: Varney Biles Total critical care time: 45 minutes Critical care time was exclusive of separately billable procedures and treating other patients. Critical care was necessary to treat or prevent imminent or life-threatening deterioration of the following conditions: sepsis and circulatory failure. Critical care was time spent personally by me on the following activities: blood draw for specimens, development of treatment plan with patient or surrogate, discussions with consultants, discussions with primary provider, gastric intubation, interpretation of cardiac output measurements, examination of patient, pulse oximetry, re-evaluation of patient's condition, review of old charts, obtaining history from patient or surrogate, ordering and review of laboratory studies and ordering and review of radiographic studies.   (including critical care time)\  Emergency Ultrasound:  US Guidance for needle guidance  Performed by Dr. Kathrynn Humble Indication: Ultrasound IV  Linear probe used in real-time to visualize location of needle entry through skin. Interpretation: IV place R [facility] Image archived electronically.    EMERGENCY DEPARTMENT [place] CARDIAC EXAM "Study: Limited Ultrasound of the heart and pericardium"  INDICATIONS:Hypotension and Tachycardia Multiple views of the heart and pericardium were obtained in real-time with a multi-frequency probe.  PERFORMED [id number]  IMAGES ARCHIVED?: Yes  FINDINGS: No pericardial effusion, Hyperdynamic contractility, IVC flat and Tamponade physiology absent  LIMITATIONS:  Emergent procedure  VIEWS USED:  Subcostal 4 chamber, Parasternal long axis, Parasternal short axis, Apical 4 chamber  and Inferior Vena Cava  INTERPRETATION: Cardiac activity present, Pericardial effusioin absent, Probable low CVP and Normal contractility   Labs Review Labs Reviewed  COMPREHENSIVE METABOLIC PANEL - Abnormal; Notable for the following:    Chloride 99 (*)    Glucose, Bld 120 (*)    BUN 55 (*)    Creatinine, Ser 3.76 (*)    Calcium 7.9 (*)    Total Protein 5.2 (*)    Albumin 1.5 (*)    GFR calc non Af Amer 15 (*)    GFR calc Af Amer 17 (*)    All other components within normal limits  CBC - Abnormal; Notable for the following:  RBC 3.62 (*)    Hemoglobin 9.9 (*)    HCT 32.3 (*)    RDW 17.3 (*)    All other components within normal limits  URINALYSIS, ROUTINE W REFLEX MICROSCOPIC (NOT AT [facility]) - Abnormal; Notable for the following:    APPearance TURBID (*)    Hgb urine dipstick LARGE (*)    Bilirubin Urine SMALL (*)    Protein, ur 100 (*)    Leukocytes, UA LARGE (*)    All other components within normal limits  TROPONIN I - Abnormal; Notable for the following:    Troponin I 0.05 (*)    All other components within normal limits  BRAIN NATRIURETIC PEPTIDE - Abnormal; Notable for the following:    B Natriuretic Peptide 393.4 (*)    All other components within normal limits  PROTIME-INR - Abnormal; Notable for the following:    Prothrombin Time 43.2 (*)    INR 4.75 (*)    All other components within normal limits  CBC - Abnormal; Notable for the following:    RBC 3.18 (*)    Hemoglobin 8.8 (*)    HCT 28.6 (*)    RDW 17.5 (*)    All other components within normal limits  URINE MICROSCOPIC-ADD ON - Abnormal; Notable for the following:    Squamous Epithelial / LPF 0-5 (*)    Bacteria, UA MANY (*)    All other components within normal limits  CBG MONITORING, ED - Abnormal; Notable for the following:    Glucose-Capillary 111 (*)    All other components within normal limits  I-STAT CG4  LACTIC ACID, ED - Abnormal; Notable for the following:    Lactic Acid, Venous 2.01 (*)    All other components within normal limits  I-STAT CHEM 8, ED - Abnormal; Notable for the following:    Sodium 133 (*)    Chloride 98 (*)    BUN 60 (*)    Creatinine, Ser 3.80 (*)    Glucose, Bld 117 (*)    Calcium, Ion 0.96 (*)    Hemoglobin 10.5 (*)    HCT 31.0 (*)    All other components within normal limits  I-STAT ARTERIAL BLOOD GAS, ED - Abnormal; Notable for the following:    pH, Arterial 7.519 (*)    pCO2 arterial 30.2 (*)    pO2, Arterial 62.0 (*)    Bicarbonate 24.7 (*)    All other components within normal limits  POC OCCULT BLOOD, ED - Abnormal; Notable for the following:    Fecal Occult Bld POSITIVE (*)    All other components within normal limits  GRAM STAIN  CULTURE, BLOOD (ROUTINE X 2)  CULTURE, BLOOD (ROUTINE X 2)  URINE CULTURE  CULTURE, EXPECTORATED SPUTUM-ASSESSMENT  TROPONIN I  LACTIC ACID, PLASMA  LACTIC ACID, PLASMA  PROTIME-INR  CORTISOL  BASIC METABOLIC PANEL  I-STAT CG4 LACTIC ACID, ED  TYPE AND SCREEN    Imaging Review Dg Chest Port 1 View  08/07/2015  CLINICAL DATA:  Fever and lethargy. EXAM: PORTABLE CHEST 1 VIEW COMPARISON:  07/20/2015 FINDINGS: Right IJ dialysis catheter unchanged with tip over the SVC. Lungs are hypoinflated with mild hazy prominence of the perihilar vessels suggesting mild vascular congestion. Moderate stable cardiomegaly. Fractures of the left lateral third and fourth ribs unchanged. Possible fracture of the anterior left second rib. Remainder of the exam is unchanged. IMPRESSION: Cardiomegaly with suggestion of mild vascular congestion. Known left-sided rib fractures as described. Electronically Signed   By: Quillian Quince  Derrel Nip M.D.   On: 08/07/2015 20:32   I have personally reviewed and evaluated these images and lab results as part of my medical decision-making.   EKG Interpretation   Date/Time:  Sunday August 07 2015 19:50:17  EDT Ventricular Rate:  99 PR Interval:    QRS Duration: 96 QT Interval:  346 QTC Calculation: 444 R Axis:   -19 Text Interpretation:  Atrial fibrillation Borderline left axis deviation  Low voltage, precordial leads Repol abnrm suggests ischemia, lateral leads  new lateral t wave inversion Confirmed by Kathrynn Humble, MD, Thelma Comp [phone]) on  08/07/2015 7:59:32 PM      MDM   Final diagnoses:  Hypoalbuminemia  Sepsis secondary to UTI (HCC)  Elevated lactic acid level  Elevated troponin  Acute respiratory failure with hypoxia [facility])   The pt is a 75 yo male with a hx of prostate CA s/p radiation, RCC s/p left nephrectomy, DVT/PE on warfarin (currently holding due to elevated INR), HTN, DM and cardiac arrest on previous admission presenting for fever and hypotension from nursing facility.  Per wife found to have temp of 100.4 at facility and BP began decreasing.    On arrival pt transiently hypotensive and afebrile.  Due to reports of fever and hypotension code sepsis intitiated.  [place] IV as above.  No hypotension initially and small bolus given due to diffuse edema and concern for CHF.  Pt became hypotensive however and 30cc/kg bolus given and broad spectrum abx given.  Blood and urine cultures obtained.  CXR without acute cardiopulmonary finding.  LA 2.01.  Trop mildly elevated with no obvious acute ischemic EKG changes.  Cardiac [place] displayed hyperdynamic contraction with no effusion and variable IVC.  UA with likely urinary source.  Pt initially responded to fluids but upon arrival of hospitalist for step down admission pt found to be hypotensive with increasing fluid weeping and third spacing.  Albumin 1.5 and given albumin and additional 1L NS.  Repeat LA and trop WNL.  Discussed with ICU and agree to admission.    Labs, EKG, and CXR were viewed by myself and incorporated into medical decision making.  Discussed pertinent finding with patient or caregiver prior to admission with no further  questions.  Pt care supervised by my attending Dr. Kathrynn Humble.   Geronimo Boot, MD PGY-3 Emergency Medicine     Geronimo Boot, MD 08/08/15 [id number]  Varney Biles, MD 08/14/15 [place], MD 08/14/15 [phone]

## 2015-08-07 NOTE — ED Notes (Signed)
Pt arrives via EMS from [facility]. Facility called EMS d/t pt having a fever and increase in lethargy. Pt alert and oriented with EMS. Pt has history of sepsis. Hypotensive with EMS sys 70's. Pt has history of afib and takes coumadin. Pt has chronic foley in place.

## 2015-08-07 NOTE — ED Notes (Signed)
Reported critical INR to Dr Liston Alba.

## 2015-08-07 NOTE — ED Notes (Signed)
CBG 111. Rn & Dr notified.

## 2015-08-07 NOTE — Progress Notes (Signed)
Patient ID: [name], male   DOB: 08-May-1940, 75 y.o.   MRN: [id number] The patient was going to be evaluated earlier for admission to the stepdown unit. Unfortunately his systolic blood pressure decreased to the 80s despite receiving 3 L of normal saline earlier. Per emergency department nursing staff, the patient seems to be a lot more edematous now. We updated Dr. Liston Alba on the patient's situation and asked him to consult critical care, since the patient most likely will need vasopressors to increase his blood pressure. The floor manager was updated as well.

## 2015-08-07 NOTE — ED Notes (Signed)
Upon rechecking BP, BP again noted to be low. Pt is  Weeping fluids from his arm. Dr Olevia Bowens at bedside and updated Dr Liston Alba. Awaiting possible critical care consult.

## 2015-08-07 NOTE — ED Notes (Signed)
Spoke with main lab, they will add on troponin. 

## 2015-08-08 ENCOUNTER — Encounter: Payer: Self-pay | Admitting: *Deleted

## 2015-08-08 ENCOUNTER — Telehealth: Payer: Self-pay | Admitting: *Deleted

## 2015-08-08 ENCOUNTER — Inpatient Hospital Stay (HOSPITAL_COMMUNITY): Payer: BLUE CROSS/BLUE SHIELD

## 2015-08-08 DIAGNOSIS — J9601 Acute respiratory failure with hypoxia: Secondary | ICD-10-CM | POA: Insufficient documentation

## 2015-08-08 DIAGNOSIS — N390 Urinary tract infection, site not specified: Secondary | ICD-10-CM | POA: Insufficient documentation

## 2015-08-08 DIAGNOSIS — D649 Anemia, unspecified: Secondary | ICD-10-CM | POA: Insufficient documentation

## 2015-08-08 DIAGNOSIS — G9340 Encephalopathy, unspecified: Secondary | ICD-10-CM | POA: Insufficient documentation

## 2015-08-08 DIAGNOSIS — N179 Acute kidney failure, unspecified: Secondary | ICD-10-CM

## 2015-08-08 DIAGNOSIS — N186 End stage renal disease: Secondary | ICD-10-CM | POA: Insufficient documentation

## 2015-08-08 DIAGNOSIS — R778 Other specified abnormalities of plasma proteins: Secondary | ICD-10-CM | POA: Insufficient documentation

## 2015-08-08 DIAGNOSIS — L8990 Pressure ulcer of unspecified site, unspecified stage: Secondary | ICD-10-CM | POA: Insufficient documentation

## 2015-08-08 DIAGNOSIS — A419 Sepsis, unspecified organism: Secondary | ICD-10-CM | POA: Insufficient documentation

## 2015-08-08 DIAGNOSIS — R195 Other fecal abnormalities: Secondary | ICD-10-CM | POA: Insufficient documentation

## 2015-08-08 DIAGNOSIS — R7989 Other specified abnormal findings of blood chemistry: Secondary | ICD-10-CM

## 2015-08-08 DIAGNOSIS — E872 Acidosis: Secondary | ICD-10-CM

## 2015-08-08 DIAGNOSIS — E8809 Other disorders of plasma-protein metabolism, not elsewhere classified: Secondary | ICD-10-CM

## 2015-08-08 DIAGNOSIS — J189 Pneumonia, unspecified organism: Secondary | ICD-10-CM | POA: Insufficient documentation

## 2015-08-08 LAB — BASIC METABOLIC PANEL
ANION GAP: 10 (ref 5–15)
Anion gap: 11 (ref 5–15)
BUN: 56 mg/dL — ABNORMAL HIGH (ref 6–20)
BUN: 47 mg/dL — AB (ref 6–20)
CHLORIDE: 105 mmol/L (ref 101–111)
CHLORIDE: 103 mmol/L (ref 101–111)
CO2: 21 mmol/L — AB (ref 22–32)
CO2: 19 mmol/L — ABNORMAL LOW (ref 22–32)
CREATININE: 2.94 mg/dL — AB (ref 0.61–1.24)
Calcium: 7.6 mg/dL — ABNORMAL LOW (ref 8.9–10.3)
Calcium: 7.7 mg/dL — ABNORMAL LOW (ref 8.9–10.3)
Creatinine, Ser: 3.53 mg/dL — ABNORMAL HIGH (ref 0.61–1.24)
GFR calc Af Amer: 18 mL/min — ABNORMAL LOW (ref >60)
GFR calc non Af Amer: 16 mL/min — ABNORMAL LOW (ref >60)
GFR calc non Af Amer: 20 mL/min — ABNORMAL LOW (ref >60)
GFR, EST AFRICAN AMERICAN: 23 mL/min — AB (ref >60)
GLUCOSE: 106 mg/dL — AB (ref 65–99)
GLUCOSE: 170 mg/dL — AB (ref 65–99)
POTASSIUM: 3.8 mmol/L (ref 3.5–5.1)
Potassium: 4.3 mmol/L (ref 3.5–5.1)
SODIUM: 133 mmol/L — AB (ref 135–145)
Sodium: 136 mmol/L (ref 135–145)

## 2015-08-08 LAB — BLOOD CULTURE ID PANEL (REFLEXED)
Acinetobacter baumannii: NOT DETECTED
CANDIDA ALBICANS: NOT DETECTED
CANDIDA GLABRATA: NOT DETECTED
CANDIDA KRUSEI: NOT DETECTED
CANDIDA PARAPSILOSIS: NOT DETECTED
CARBAPENEM RESISTANCE: NOT DETECTED
Candida tropicalis: NOT DETECTED
ENTEROBACTER CLOACAE COMPLEX: NOT DETECTED
ENTEROBACTERIACEAE SPECIES: DETECTED — AB
Enterococcus species: NOT DETECTED
Escherichia coli: NOT DETECTED
Haemophilus influenzae: NOT DETECTED
KLEBSIELLA OXYTOCA: NOT DETECTED
KLEBSIELLA PNEUMONIAE: DETECTED — AB
Listeria monocytogenes: NOT DETECTED
Methicillin resistance: DETECTED — AB
Neisseria meningitidis: NOT DETECTED
PSEUDOMONAS AERUGINOSA: NOT DETECTED
Proteus species: NOT DETECTED
STREPTOCOCCUS AGALACTIAE: NOT DETECTED
STREPTOCOCCUS PNEUMONIAE: NOT DETECTED
Serratia marcescens: NOT DETECTED
Staphylococcus aureus (BCID): NOT DETECTED
Staphylococcus species: DETECTED — AB
Streptococcus pyogenes: NOT DETECTED
Streptococcus species: NOT DETECTED
Vancomycin resistance: NOT DETECTED

## 2015-08-08 LAB — GLUCOSE, CAPILLARY
GLUCOSE-CAPILLARY: 109 mg/dL — AB (ref 65–99)
GLUCOSE-CAPILLARY: 145 mg/dL — AB (ref 65–99)
GLUCOSE-CAPILLARY: 161 mg/dL — AB (ref 65–99)
GLUCOSE-CAPILLARY: 116 mg/dL — AB (ref 65–99)
Glucose-Capillary: 99 mg/dL (ref 65–99)
Glucose-Capillary: 171 mg/dL — ABNORMAL HIGH (ref 65–99)
Glucose-Capillary: 185 mg/dL — ABNORMAL HIGH (ref 65–99)

## 2015-08-08 LAB — CORTISOL: Cortisol, Plasma: 48.3 ug/dL

## 2015-08-08 LAB — POCT I-STAT, CHEM 8
BUN: 46 mg/dL — ABNORMAL HIGH (ref 6–20)
CALCIUM ION: 1.11 mmol/L — AB (ref 1.12–1.23)
CHLORIDE: 100 mmol/L — AB (ref 101–111)
Creatinine, Ser: 2.9 mg/dL — ABNORMAL HIGH (ref 0.61–1.24)
Glucose, Bld: 160 mg/dL — ABNORMAL HIGH (ref 65–99)
HCT: 25 % — ABNORMAL LOW (ref 39.0–52.0)
HEMOGLOBIN: 8.5 g/dL — AB (ref 13.0–17.0)
Potassium: 4 mmol/L (ref 3.5–5.1)
SODIUM: 136 mmol/L (ref 135–145)
TCO2: 21 mmol/L (ref 0–100)

## 2015-08-08 LAB — TROPONIN I

## 2015-08-08 LAB — TYPE AND SCREEN
ABO/RH(D): A POS
Antibody Screen: NEGATIVE

## 2015-08-08 LAB — PROTIME-INR
INR: 5.44 (ref 0.00–1.49)
Prothrombin Time: 47.9 seconds — ABNORMAL HIGH (ref 11.6–15.2)

## 2015-08-08 LAB — URINE CULTURE

## 2015-08-08 MED ORDER — ALBUMIN HUMAN 25 % IV SOLN
12.5000 g | Freq: Once | INTRAVENOUS | Status: AC
  Administered 2015-08-08: 12.5 g via INTRAVENOUS
  Filled 2015-08-08: qty 50

## 2015-08-08 MED ORDER — NEPRO/CARBSTEADY PO LIQD: 237.0000 mL | Freq: Every day | ORAL | Status: DC

## 2015-08-08 MED ORDER — PANTOPRAZOLE SODIUM 40 MG PO TBEC
40.0000 mg | DELAYED_RELEASE_TABLET | Freq: Two times a day (BID) | ORAL | Status: DC
  Administered 2015-08-08 – 2015-08-11 (×7): 40 mg via ORAL
  Filled 2015-08-08 (×7): qty 1

## 2015-08-08 MED ORDER — DEXTROSE 5 % IV SOLN: 2.0000 g | Freq: Once | INTRAVENOUS | Status: DC

## 2015-08-08 MED ORDER — PRISMASOL BGK 4/2.5 32-4-2.5 MEQ/L IV SOLN
INTRAVENOUS | Status: DC
  Administered 2015-08-08 – 2015-08-12 (×6): via INTRAVENOUS_CENTRAL
  Filled 2015-08-08 (×8): qty 5000

## 2015-08-08 MED ORDER — PRO-STAT SUGAR FREE PO LIQD
30.0000 mL | Freq: Two times a day (BID) | ORAL | Status: DC
  Administered 2015-08-08 – 2015-08-23 (×26): 30 mL via ORAL
  Filled 2015-08-08 (×26): qty 30

## 2015-08-08 MED ORDER — HYDROCODONE-ACETAMINOPHEN 5-325 MG PO TABS
1.0000 | ORAL_TABLET | Freq: Three times a day (TID) | ORAL | Status: DC | PRN
  Administered 2015-08-08 – 2015-08-23 (×18): 1 via ORAL
  Filled 2015-08-08 (×18): qty 1

## 2015-08-08 MED ORDER — MIDODRINE HCL 5 MG PO TABS
10.0000 mg | ORAL_TABLET | Freq: Three times a day (TID) | ORAL | Status: DC
  Administered 2015-08-08 – 2015-08-23 (×48): 10 mg via ORAL
  Filled 2015-08-08 (×45): qty 2

## 2015-08-08 MED ORDER — SODIUM CHLORIDE 0.9 % IV SOLN
500.0000 mg | Freq: Three times a day (TID) | INTRAVENOUS | Status: DC
  Administered 2015-08-08 – 2015-08-12 (×12): 500 mg via INTRAVENOUS
  Filled 2015-08-08 (×14): qty 500

## 2015-08-08 MED ORDER — PRISMASOL BGK 4/2.5 32-4-2.5 MEQ/L IV SOLN
INTRAVENOUS | Status: DC
  Administered 2015-08-08 – 2015-08-12 (×23): via INTRAVENOUS_CENTRAL
  Filled 2015-08-08 (×35): qty 5000

## 2015-08-08 MED ORDER — PRO-STAT SUGAR FREE PO LIQD: 30.0000 mL | Freq: Three times a day (TID) | ORAL | Status: DC

## 2015-08-08 MED ORDER — BUDESONIDE 0.5 MG/2ML IN SUSP
0.5000 mg | Freq: Two times a day (BID) | RESPIRATORY_TRACT | Status: DC
  Administered 2015-08-08 – 2015-08-20 (×24): 0.5 mg via RESPIRATORY_TRACT
  Filled 2015-08-08 (×25): qty 2

## 2015-08-08 MED ORDER — MENTHOL 3 MG MT LOZG: 1.0000 | LOZENGE | OROMUCOSAL | Status: DC | PRN

## 2015-08-08 MED ORDER — FENOFIBRATE 160 MG PO TABS: 160.0000 mg | ORAL_TABLET | Freq: Every day | ORAL | Status: DC

## 2015-08-08 MED ORDER — SODIUM CHLORIDE 0.9 % IV SOLN: 1500.0000 mg | INTRAVENOUS | Status: DC

## 2015-08-08 MED ORDER — GUAIFENESIN 100 MG/5ML PO SOLN: 10.0000 mL | Freq: Three times a day (TID) | ORAL | Status: DC

## 2015-08-08 MED ORDER — DOPAMINE-DEXTROSE 3.2-5 MG/ML-% IV SOLN: 0.0000 ug/kg/min | INTRAVENOUS | Status: DC

## 2015-08-08 MED ORDER — SODIUM CHLORIDE 0.9% FLUSH
3.0000 mL | Freq: Two times a day (BID) | INTRAVENOUS | Status: DC
Start: 2015-08-08 — End: 2015-08-23
  Administered 2015-08-08 – 2015-08-18 (×22): 3 mL via INTRAVENOUS

## 2015-08-08 MED ORDER — HYDROCORTISONE NA SUCCINATE PF 100 MG IJ SOLR
50.0000 mg | Freq: Four times a day (QID) | INTRAMUSCULAR | Status: DC
  Administered 2015-08-08 (×2): 50 mg via INTRAVENOUS
  Filled 2015-08-08 (×2): qty 2

## 2015-08-08 MED ORDER — IPRATROPIUM-ALBUTEROL 0.5-2.5 (3) MG/3ML IN SOLN
3.0000 mL | Freq: Four times a day (QID) | RESPIRATORY_TRACT | Status: DC
  Administered 2015-08-08 – 2015-08-12 (×16): 3 mL via RESPIRATORY_TRACT
  Filled 2015-08-08 (×15): qty 3

## 2015-08-08 MED ORDER — SODIUM CHLORIDE 0.9 % IV SOLN
100.0000 mg | Freq: Once | INTRAVENOUS | Status: DC
  Administered 2015-08-08: 100 mg via INTRAVENOUS
  Filled 2015-08-08: qty 100

## 2015-08-08 MED ORDER — DARBEPOETIN ALFA 100 MCG/0.5ML IJ SOSY
100.0000 ug | PREFILLED_SYRINGE | INTRAMUSCULAR | Status: DC
  Administered 2015-08-08 – 2015-08-15 (×2): 100 ug via INTRAVENOUS
  Filled 2015-08-08 (×4): qty 0.5

## 2015-08-08 MED ORDER — FENTANYL CITRATE (PF) 100 MCG/2ML IJ SOLN
25.0000 ug | INTRAMUSCULAR | Status: DC | PRN
  Administered 2015-08-08: 25 ug via INTRAVENOUS
  Administered 2015-08-08 – 2015-08-10 (×6): 50 ug via INTRAVENOUS
  Filled 2015-08-08 (×8): qty 2

## 2015-08-08 MED ORDER — INSULIN ASPART 100 UNIT/ML ~~LOC~~ SOLN: 0.0000 [IU] | Freq: Three times a day (TID) | SUBCUTANEOUS | Status: DC

## 2015-08-08 MED ORDER — IPRATROPIUM-ALBUTEROL 0.5-2.5 (3) MG/3ML IN SOLN: 3.0000 mL | RESPIRATORY_TRACT | Status: DC | PRN

## 2015-08-08 MED ORDER — VANCOMYCIN HCL IN DEXTROSE 1-5 GM/200ML-% IV SOLN
1000.0000 mg | Freq: Once | INTRAVENOUS | Status: AC
Start: 2015-08-08 — End: 2015-08-08
  Administered 2015-08-08: 1000 mg via INTRAVENOUS
  Filled 2015-08-08: qty 200

## 2015-08-08 MED ORDER — CALCIUM GLUCONATE 10 % IV SOLN
1.0000 g | Freq: Once | INTRAVENOUS | Status: AC
  Administered 2015-08-08: 1 g via INTRAVENOUS
  Filled 2015-08-08 (×2): qty 10

## 2015-08-08 MED ORDER — SODIUM CHLORIDE 0.9 % FOR CRRT
INTRAVENOUS_CENTRAL | Status: DC | PRN
  Filled 2015-08-08: qty 1000

## 2015-08-08 MED ORDER — NEPRO/CARBSTEADY PO LIQD
237.0000 mL | Freq: Two times a day (BID) | ORAL | Status: DC
  Administered 2015-08-08 – 2015-08-23 (×19): 237 mL via ORAL
  Filled 2015-08-08 (×11): qty 237

## 2015-08-08 MED ORDER — LIDOCAINE HCL 2 % EX GEL
1.0000 "application " | Freq: Once | CUTANEOUS | Status: AC
  Administered 2015-08-08: 1 via URETHRAL
  Filled 2015-08-08: qty 5

## 2015-08-08 MED ORDER — ONDANSETRON HCL 4 MG/2ML IJ SOLN
4.0000 mg | Freq: Four times a day (QID) | INTRAMUSCULAR | Status: DC | PRN
  Administered 2015-08-09 – 2015-08-11 (×3): 4 mg via INTRAVENOUS
  Filled 2015-08-08 (×3): qty 2

## 2015-08-08 MED ORDER — HEPARIN SODIUM (PORCINE) 1000 UNIT/ML DIALYSIS
1000.0000 [IU] | INTRAMUSCULAR | Status: DC | PRN
  Administered 2015-08-12: 3200 [IU] via INTRAVENOUS_CENTRAL
  Filled 2015-08-08: qty 6
  Filled 2015-08-08: qty 4
  Filled 2015-08-08: qty 6

## 2015-08-08 MED ORDER — LIDOCAINE HCL 2 % EX GEL
1.0000 "application " | Freq: Once | CUTANEOUS | Status: DC
  Filled 2015-08-08: qty 5

## 2015-08-08 MED ORDER — LORAZEPAM 1 MG PO TABS: 1.0000 mg | ORAL_TABLET | Freq: Every evening | ORAL | Status: DC | PRN

## 2015-08-08 MED ORDER — NYSTATIN 100000 UNIT/ML MT SUSP
5.0000 mL | Freq: Four times a day (QID) | OROMUCOSAL | Status: DC
  Administered 2015-08-08 – 2015-08-23 (×54): 500000 [IU] via ORAL
  Filled 2015-08-08 (×54): qty 5

## 2015-08-08 MED ORDER — RENA-VITE PO TABS: 1.0000 | ORAL_TABLET | Freq: Every day | ORAL | Status: DC

## 2015-08-08 MED ORDER — INSULIN ASPART 100 UNIT/ML ~~LOC~~ SOLN
0.0000 [IU] | SUBCUTANEOUS | Status: DC
  Administered 2015-08-08: 2 [IU] via SUBCUTANEOUS
  Administered 2015-08-08 (×2): 3 [IU] via SUBCUTANEOUS
  Administered 2015-08-09 (×2): 2 [IU] via SUBCUTANEOUS

## 2015-08-08 MED ORDER — PRISMASOL BGK 4/2.5 32-4-2.5 MEQ/L IV SOLN
INTRAVENOUS | Status: DC
  Administered 2015-08-08 – 2015-08-11 (×5): via INTRAVENOUS_CENTRAL
  Filled 2015-08-08 (×6): qty 5000

## 2015-08-08 MED ORDER — SODIUM CHLORIDE 0.9 % IV SOLN: 50.0000 mg | INTRAVENOUS | Status: DC

## 2015-08-08 MED ORDER — FLUCONAZOLE IN SODIUM CHLORIDE 100-0.9 MG/50ML-% IV SOLN
100.0000 mg | INTRAVENOUS | Status: DC
  Filled 2015-08-08: qty 50

## 2015-08-08 MED ORDER — ONDANSETRON HCL 4 MG PO TABS
4.0000 mg | ORAL_TABLET | Freq: Four times a day (QID) | ORAL | Status: DC | PRN
  Administered 2015-08-15 – 2015-08-22 (×6): 4 mg via ORAL
  Filled 2015-08-08 (×9): qty 1

## 2015-08-08 MED ORDER — MIDODRINE HCL 5 MG PO TABS: 10.0000 mg | ORAL_TABLET | Freq: Three times a day (TID) | ORAL | Status: DC

## 2015-08-08 MED ORDER — DEXTROSE 5 % IV SOLN
2.0000 g | Freq: Two times a day (BID) | INTRAVENOUS | Status: DC
  Filled 2015-08-08 (×2): qty 2

## 2015-08-08 MED ORDER — RENA-VITE PO TABS
1.0000 | ORAL_TABLET | Freq: Every day | ORAL | Status: DC
  Administered 2015-08-08 – 2015-08-22 (×15): 1 via ORAL
  Filled 2015-08-08 (×16): qty 1

## 2015-08-08 MED ORDER — VANCOMYCIN HCL IN DEXTROSE 1-5 GM/200ML-% IV SOLN
1000.0000 mg | INTRAVENOUS | Status: DC
  Administered 2015-08-09 – 2015-08-10 (×2): 1000 mg via INTRAVENOUS
  Filled 2015-08-08 (×3): qty 200

## 2015-08-08 MED ORDER — POLYVINYL ALCOHOL 1.4 % OP SOLN: 1.0000 [drp] | Freq: Two times a day (BID) | OPHTHALMIC | Status: DC | PRN

## 2015-08-08 NOTE — Progress Notes (Signed)
PHARMACY - PHYSICIAN COMMUNICATION CRITICAL VALUE ALERT - BLOOD CULTURE IDENTIFICATION (BCID)  Results for orders placed or performed during the hospital encounter of 08/07/15  Blood Culture ID Panel (Reflexed) (Collected: 08/07/2015  8:18 PM)  Result Value Ref Range   Enterococcus species NOT DETECTED NOT DETECTED   Vancomycin resistance NOT DETECTED NOT DETECTED   Listeria monocytogenes NOT DETECTED NOT DETECTED   Staphylococcus species DETECTED (A) NOT DETECTED   Staphylococcus aureus NOT DETECTED NOT DETECTED   Methicillin resistance DETECTED (A) NOT DETECTED   Streptococcus species NOT DETECTED NOT DETECTED   Streptococcus agalactiae NOT DETECTED NOT DETECTED   Streptococcus pneumoniae NOT DETECTED NOT DETECTED   Streptococcus pyogenes NOT DETECTED NOT DETECTED   Acinetobacter baumannii NOT DETECTED NOT DETECTED   Enterobacteriaceae species DETECTED (A) NOT DETECTED   Enterobacter cloacae complex NOT DETECTED NOT DETECTED   Escherichia coli NOT DETECTED NOT DETECTED   Klebsiella oxytoca NOT DETECTED NOT DETECTED   Klebsiella pneumoniae DETECTED (A) NOT DETECTED   Proteus species NOT DETECTED NOT DETECTED   Serratia marcescens NOT DETECTED NOT DETECTED   Carbapenem resistance NOT DETECTED NOT DETECTED   Haemophilus influenzae NOT DETECTED NOT DETECTED   Neisseria meningitidis NOT DETECTED NOT DETECTED   Pseudomonas aeruginosa NOT DETECTED NOT DETECTED   Candida albicans NOT DETECTED NOT DETECTED   Candida glabrata NOT DETECTED NOT DETECTED   Candida krusei NOT DETECTED NOT DETECTED   Candida parapsilosis NOT DETECTED NOT DETECTED   Candida tropicalis NOT DETECTED NOT DETECTED    Name of physician (or Provider) Contacted: Dr. Titus Mould  Blood cx with 1/2 Kleb Pneumo and CoNS in anaerobic bottle. Patient had ESBL Kleb Pneumo in urine on 6/14 this month. Also had a more sensitive Kleb Pneumo in blood back in April. Patient also starting CRRT today. Discussed abx with Dr.  Titus Mould, ok to stop antifungal now that we have bacteria in blood. Change cefepime to imipenem due to recent ESBL Kleb Pneumo in urine.  Changes to prescribed antibiotics required:  Stop cefepime Stop fluconazole Start imipenem 500mg  IV Q8 Continue vancomycin 1g IV Q24 for now  Elenor Quinones, PharmD, BCPS Clinical Pharmacist Pager [phone] 08/08/2015 12:50 PM

## 2015-08-08 NOTE — Consult Note (Signed)
WOC wound consult note Reason for Consult: Stage 2 Pressure injury supected Wound type: MASD (moisture associated skin damage) bilateral buttocks and posterior thigh Measurement: 3 small areas noted, superficial 1cm x 1cm x 0.1cm  Wound bed: superficial skin peeling Drainage (amount, consistency, odor) none Periwound: intact, but with some redness that blanches Dressing procedure/placement/frequency: Patient incontinent of bowels at times, these areas are near the rectum, for that reason I would not recommend dressings because they will continue to get soiled.  Would recommend barrier cream instead of dressings to be applied each shift and after each episode of incontinence.  Discussed POC with patient and bedside nurse.  Re consult if needed, will not follow at this time. Thanks  Rodrecus Belsky [employer], RN,CNS, [place] [phone])

## 2015-08-08 NOTE — Progress Notes (Signed)
[facility] Progress Note Patient Name: [name] DOB: May 06, 1940 MRN: [id number]   Date of Service  08/08/2015  HPI/Events of Note  Rt shoulder chronic pain norco not working  eICU Interventions  Fent prn     Intervention Category Minor Interventions: Routine modifications to care plan (e.g. PRN medications for pain, fever)  ALVA,RAKESH V. 08/08/2015, 6:58 PM

## 2015-08-08 NOTE — Progress Notes (Signed)
Initial Nutrition Assessment  DOCUMENTATION CODES:   Obesity unspecified  INTERVENTION:   Nepro Shake po BID, each supplement provides 425 kcal and 19 grams protein  Prostat liquid protein po 30 ml BID with meals, each supplement provides 100 kcal, 15 grams protein  NUTRITION DIAGNOSIS:   Increased nutrient needs related to chronic illness, wound healing as evidenced by estimated needs  GOAL:   Patient will meet greater than or equal to 90% of their needs  MONITOR:   PO intake, Supplement acceptance, Labs, Weight trends, Skin, I & O's  REASON FOR ASSESSMENT:   Malnutrition Screening Tool  ASSESSMENT:   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    RD unable to obtain nutrition hx >> pt encephalopathic. RN setting up CVVHD machine upon visit. Pt with hx of dysphagia and variable PO intake. During previous hospitalization pt was on a Dys 2, thin liquid diet. Would benefit from oral nutrition supplements >> will order. Unable to complete Nutrition Focused Physical Exam at this time.  Diet Order:  Diet full liquid Room service appropriate?: Yes; Fluid consistency:: Thin  Skin:  Wound (see comment) (stage II sacrum)  Last BM:  PTA  Height:   Ht Readings from Last 1 Encounters:  08/08/15 5\' 10"  (1.778 m)    Weight:   Wt Readings from Last 1 Encounters:  08/08/15 247 lb 5.7 oz (112.2 kg)    Ideal Body Weight:  75 kg  BMI:  Body mass index is 35.49 kg/(m^2).  Estimated Nutritional Needs:   Kcal:   1950-2150  Protein:  110-130 gm  Fluid:  per MD  EDUCATION NEEDS:   No education needs identified at this time  Arthur Holms, RD, LDN Pager #: [phone] After-Hours Pager #: [phone]

## 2015-08-08 NOTE — Progress Notes (Signed)
PHARMACY - PHYSICIAN COMMUNICATION CRITICAL VALUE ALERT - BLOOD CULTURE IDENTIFICATION (BCID)  Results for orders placed or performed during the hospital encounter of 08/07/15  Blood Culture ID Panel (Reflexed) (Collected: 08/07/2015  8:18 PM)  Result Value Ref Range   Enterococcus species NOT DETECTED NOT DETECTED   Vancomycin resistance NOT DETECTED NOT DETECTED   Listeria monocytogenes NOT DETECTED NOT DETECTED   Staphylococcus species DETECTED (A) NOT DETECTED   Staphylococcus aureus NOT DETECTED NOT DETECTED   Methicillin resistance DETECTED (A) NOT DETECTED   Streptococcus species NOT DETECTED NOT DETECTED   Streptococcus agalactiae NOT DETECTED NOT DETECTED   Streptococcus pneumoniae NOT DETECTED NOT DETECTED   Streptococcus pyogenes NOT DETECTED NOT DETECTED   Acinetobacter baumannii NOT DETECTED NOT DETECTED   Enterobacteriaceae species DETECTED (A) NOT DETECTED   Enterobacter cloacae complex NOT DETECTED NOT DETECTED   Escherichia coli NOT DETECTED NOT DETECTED   Klebsiella oxytoca NOT DETECTED NOT DETECTED   Klebsiella pneumoniae DETECTED (A) NOT DETECTED   Proteus species NOT DETECTED NOT DETECTED   Serratia marcescens NOT DETECTED NOT DETECTED   Carbapenem resistance NOT DETECTED NOT DETECTED   Haemophilus influenzae NOT DETECTED NOT DETECTED   Neisseria meningitidis NOT DETECTED NOT DETECTED   Pseudomonas aeruginosa NOT DETECTED NOT DETECTED   Candida albicans NOT DETECTED NOT DETECTED   Candida glabrata NOT DETECTED NOT DETECTED   Candida krusei NOT DETECTED NOT DETECTED   Candida parapsilosis NOT DETECTED NOT DETECTED   Candida tropicalis NOT DETECTED NOT DETECTED    Name of physician (or Provider) Contacted: Dr. Titus Mould  Blood cx with 1/2 Kleb Pneumo and CoNS in anaerobic bottle. Patient had ESBL Kleb Pneumo in urine on 6/14 this month. Also had a more sensitive Kleb Pneumo in blood back in April. Patient also starting CRRT today. Discussed abx with Dr.  Titus Mould, ok to stop antifungal now that we have bacteria in blood. Change cefepime to imipenem due to recent ESBL Kleb Pneumo in urine.  Changes to prescribed antibiotics required:  Stop cefepime Stop fluconazole Start imipenem 500mg  IV Q8 Continue vancomycin 1g IV Q24 for now  Elenor Quinones, PharmD, BCPS Clinical Pharmacist Pager [phone] 08/08/2015 12:50 PM   1800 pm: Same results obtained from aerobic bottle, no new interventions needed.  Thank you.  Anette Guarneri, PharmD

## 2015-08-08 NOTE — Progress Notes (Signed)
[facility] Progress Note Patient Name: [name] DOB: December 13, 1940 MRN: [id number]   Date of Service  08/08/2015  HPI/Events of Note  Chronic pain - related to recent shoulder surgery.   eICU Interventions  Will order: 1. Restart home Norco 5 mg/325 mg PO Q 8 hours PRN pain.     Intervention Category Intermediate Interventions: Pain - evaluation and management  Iverna Hammac [place] 08/08/2015, 2:23 AM

## 2015-08-08 NOTE — Telephone Encounter (Signed)
[id number] Rosana Hoes, BSN, RN3, CCM, CN: Telephone to patient and family/patient had been moved to a local snf but had to be readmitted due to infection/ wife doing well/will call for any needs.

## 2015-08-08 NOTE — Consult Note (Signed)
[facility] Renal Consultation Note  Indication for Consultation:  Management of ESRD/hemodialysis; anemia, hypertension/volume and secondary hyperparathyroidism  HPI: [name] is a 75 y.o. male new ESRD MWF ([place]  3 op hd txs last on 08/05/15 uneventful except left below edw 109.5  At 106.8)  Ho prostate CA s/p radiation 2011 and RCC s/p left nephrectomy and most recently  extensive admit  05/31/15 > 07/31/15 for multiple problems including but not limited to Klebsiella bacteremia with septic shock with  Pyelonephritis/UTI complicated by hemorrhagic shock due to coagulopathy with subsequent cardiac arrest, complicated by left pneumothorax following CPR s/p chest tube placement.     Now brought from [facility] to ER   Secondary to  fatigue, somnolence, fever. He was found to have UTI and was initially being admitted to SDU as he had responded well to 64m/kg bolus; however, he later developed hypotension along with significant third spacing. He was started on albumin and due to persistent hypotension, PCCM was asked to admit to ICU for septic shock and was encephalopathic . Currently alert  And OX3  sbp 83/ no pressors / O2 sat 98% 2 l Beltrami K 4.9 , hgb 9.9 wbc8.3 . INR 4.75  CXR today L ?pl efuusion Vs  PNA ,UA  purulent appearing. BC and Urine CS pend /  Cos  R shoulder pain "my chronic pain  "/ no sob .       Past Medical History  Diagnosis Date  . Obesity   . Phlebitis     Lower extermity  . Pulmonary emboli ([place] 2008    submassive, saddle  . [place]state [place]  . Sleep apnea     on CPAP  . Hx of echocardiogram 12/04/2010    Normal EF >55% no significant valve disease  . History of stress test 06/27/2009    Low risk and EF of approximately 50%  . DVT (deep venous thrombosis) ([place]   . Chronic kidney disease, stage 3     baseline creatinine ~1.4  . HLD (hyperlipidemia)   . HTN (hypertension)   . Dysrhythmia     A fib  . Diabetes mellitus ([place]     diet  controlled  . History of hiatal hernia     Past Surgical History  Procedure Laterality Date  . Nephrectomy  1999    for CA  . Cholecystectomy  1999  . Transurethral resection of bladder tumor N/A 03/04/2013    Procedure: CYSTOSCOPY WITH RIGHT RETROGRADE PYELOGRAM AND BLADDER BIOPSY /CLOT EVACUATION/ BIOPSY PROSTATIC URETHRA WITH FULGERATION ;  Surgeon: DMolli Hazard MD;  Location: [facility];  Service: Urology;  Laterality: N/A;  . Cystoscopy w/ retrogrades N/A 04/08/2013    Procedure: CYSTOSCOPY WITH CLOT EVACUATION;  Surgeon: DMolli Hazard MD;  Location: [facility];  Service: Urology;  Laterality: N/A;  . Left shoulder repair    . Shoulder open rotator cuff repair Right 05/03/2015    Procedure: RIGHT SHOULDER ROTATOR CUFF REPAIR OPEN WITH GRAFT AND ANCHOR ;  Surgeon: RLatanya Maudlin MD;  Location: [facility];  Service: Orthopedics;  Laterality: Right;  . Av fistula placement Left 07/25/2015    Procedure: ARTERIOVENOUS (AV) FISTULA CREATION;  Surgeon: CElam Dutch MD;  [place]  Service: Vascular;  Laterality: Left;  Axillary block with minimal sedation and supplemental local at operative site.      Family History  Problem Relation Age of Onset  . Cancer Mother   . Diabetes Father   .  Cancer Father       reports that he has never smoked. He has never used smokeless tobacco. He reports that he drinks alcohol. He reports that he does not use illicit drugs.  No Known Allergies  Prior to Admission medications   Medication Sig Start Date End Date Taking? Authorizing Provider  Amino Acids-Protein Hydrolys (FEEDING SUPPLEMENT, PRO-STAT SUGAR FREE 64,) LIQD Take 30 mLs by mouth 3 (three) times daily with meals. 07/31/15  Yes Lavina Hamman, MD  fenofibrate (TRICOR) 145 MG tablet Take 145 mg by mouth daily.  10/18/12  Yes Historical Provider, MD  ferrous sulfate 325 (65 FE) MG EC tablet take 1 tablet by mouth once daily 01/17/15  Yes Troy Sine, MD  guaiFENesin  (ROBITUSSIN) 100 MG/5ML SOLN Take 10 mLs (200 mg total) by mouth every 8 (eight) hours. 07/31/15  Yes Lavina Hamman, MD  HYDROcodone-acetaminophen (NORCO/VICODIN) 5-325 MG tablet Take 1 tablet by mouth every 8 (eight) hours as needed for moderate pain or severe pain (breakthrough pain). Patient taking differently: Take 1 tablet by mouth every 8 (eight) hours as needed for moderate pain.  07/31/15  Yes Lavina Hamman, MD  insulin aspart (NOVOLOG) 100 UNIT/ML injection CBG 121 - 150:2u, CBG 151 - 200:3u, CBG 201 - 250:5u, CBG 251 - 300:8u,CBG 301 - 350:11u, CBG 351 - 400:15u Patient taking differently: Inject 2-15 Units into the skin 3 (three) times daily with meals. CBG 121-150 2 units, 151-200 3 units, 201-250 5 units, 251-300 8 units, 301-350 11 units, 351-400 15 units 07/31/15  Yes Lavina Hamman, MD  ipratropium-albuterol (DUONEB) 0.5-2.5 (3) MG/3ML SOLN Take 3 mLs by nebulization every 4 (four) hours as needed (shortness of breath/ wheezing).   Yes Historical Provider, MD  LORazepam (ATIVAN) 1 MG tablet Take 1 tablet (1 mg total) by mouth at bedtime. 07/31/15  Yes Lavina Hamman, MD  menthol-cetylpyridinium (CEPACOL) 3 MG lozenge Take 1 lozenge (3 mg total) by mouth as needed for sore throat. 07/31/15  Yes Lavina Hamman, MD  midodrine (PROAMATINE) 10 MG tablet Take 1 tablet (10 mg total) by mouth 3 (three) times daily before meals. Patient taking differently: Take 10 mg by mouth 3 (three) times daily. 6:30am, 11:30am, 16:30pm 07/31/15  Yes Lavina Hamman, MD  multivitamin (RENA-VIT) TABS tablet Take 1 tablet by mouth at bedtime. 07/31/15  Yes Lavina Hamman, MD  Nutritional Supplements (NUTRITIONAL SUPPLEMENT PO) Take 120 mLs by mouth every evening. Med Plus 2.0   Yes Historical Provider, MD  nystatin (MYCOSTATIN) 100000 UNIT/ML suspension Take 5 mLs (500,000 Units total) by mouth 4 (four) times daily. Patient taking differently: Take 5 mLs by mouth 4 (four) times daily. For thrush - swish and swallow  07/31/15 08/12/15 Yes Lavina Hamman, MD  ondansetron (ZOFRAN) 4 MG tablet Take 4 mg by mouth every 6 (six) hours as needed for nausea or vomiting.   Yes Historical Provider, MD  pantoprazole (PROTONIX) 40 MG tablet Take 1 tablet (40 mg total) by mouth 2 (two) times daily before a meal. After 08/12/15 take once a day 07/31/15 08/11/15 Yes Lavina Hamman, MD  polyvinyl alcohol (LIQUIFILM TEARS) 1.4 % ophthalmic solution Place 1 drop into both eyes 2 (two) times daily as needed for dry eyes.   Yes Historical Provider, MD  PRESCRIPTION MEDICATION Inhale into the lungs at bedtime. CPAP   Yes Historical Provider, MD  Maltodextrin-Xanthan Gum (RESOURCE THICKENUP CLEAR) POWD Take 1 g by mouth as needed. 07/31/15  Lavina Hamman, MD  Nutritional Supplements (FEEDING SUPPLEMENT, NEPRO CARB STEADY,) LIQD Take 237 mLs by mouth daily at 3 pm. 07/31/15   Lavina Hamman, MD  warfarin (COUMADIN) 5 MG tablet Take 1 tablet (5 mg total) by mouth one time only at 6 PM. Patient taking differently: Take 5 mg by mouth every evening.  08/01/15   Lavina Hamman, MD     Anti-infectives    Start     Dose/Rate Route Frequency Ordered Stop   08/09/15 1200  vancomycin (VANCOCIN) 1,500 mg in sodium chloride 0.9 % 500 mL IVPB  Status:  Discontinued     1,500 mg 250 mL/hr over 120 Minutes Intravenous Every 48 hours 08/08/15 0034 08/08/15 0842   08/09/15 0900  anidulafungin (ERAXIS) 50 mg in sodium chloride 0.9 % 50 mL IVPB  Status:  Discontinued     50 mg over 45 Minutes Intravenous Every 24 hours 08/08/15 0847 08/08/15 1125   08/08/15 1130  fluconazole (DIFLUCAN) IVPB 100 mg     100 mg 50 mL/hr over 60 Minutes Intravenous Every 24 hours 08/08/15 1125     08/08/15 1100  vancomycin (VANCOCIN) IVPB 1000 mg/200 mL premix     1,000 mg 200 mL/hr over 60 Minutes Intravenous  Once 08/08/15 1056     08/08/15 0900  anidulafungin (ERAXIS) 100 mg in sodium chloride 0.9 % 100 mL IVPB  Status:  Discontinued     100 mg over 90 Minutes  Intravenous  Once 08/08/15 0847 08/08/15 1125   08/08/15 0400  ceFEPIme (MAXIPIME) 1 g in dextrose 5 % 50 mL IVPB     1 g 100 mL/hr over 30 Minutes Intravenous Every 24 hours 08/07/15 2303     08/08/15 0015  ceFEPIme (MAXIPIME) 2 g in dextrose 5 % 50 mL IVPB  Status:  Discontinued     2 g 100 mL/hr over 30 Minutes Intravenous  Once 08/08/15 0010 08/08/15 0033   08/07/15 2100  vancomycin (VANCOCIN) 1,500 mg in sodium chloride 0.9 % 500 mL IVPB     1,500 mg 250 mL/hr over 120 Minutes Intravenous  Once 08/07/15 2059 08/08/15 0048   08/07/15 2100  piperacillin-tazobactam (ZOSYN) IVPB 3.375 g     3.375 g 100 mL/hr over 30 Minutes Intravenous  Once 08/07/15 2059 08/07/15 2152      Results for orders placed or performed during the hospital encounter of 08/07/15 (from the past 48 hour(s))  Brain natriuretic peptide     Status: Abnormal   Collection Time: 08/07/15  7:52 PM  Result Value Ref Range   B Natriuretic Peptide 393.4 (H) 0.0 - 100.0 pg/mL  Comprehensive metabolic panel     Status: Abnormal   Collection Time: 08/07/15  7:57 PM  Result Value Ref Range   Sodium 135 135 - 145 mmol/L   Potassium 4.8 3.5 - 5.1 mmol/L   Chloride 99 (L) 101 - 111 mmol/L   CO2 24 22 - 32 mmol/L   Glucose, Bld 120 (H) 65 - 99 mg/dL   BUN 55 (H) 6 - 20 mg/dL   Creatinine, Ser 3.76 (H) 0.61 - 1.24 mg/dL   Calcium 7.9 (L) 8.9 - 10.3 mg/dL   Total Protein 5.2 (L) 6.5 - 8.1 g/dL   Albumin 1.5 (L) 3.5 - 5.0 g/dL   AST 29 15 - 41 U/L   ALT 20 17 - 63 U/L   Alkaline Phosphatase 115 38 - 126 U/L   Total Bilirubin 0.8 0.3 - 1.2 mg/dL  GFR calc non Af Amer 15 (L) >60 mL/min   GFR calc Af Amer 17 (L) >60 mL/min    Comment: (NOTE) The eGFR has been calculated using the CKD EPI equation. This calculation has not been validated in all clinical situations. eGFR's persistently <60 mL/min signify possible Chronic Kidney Disease.    Anion gap 12 5 - 15  CBC     Status: Abnormal   Collection Time: 08/07/15  7:57  PM  Result Value Ref Range   WBC 8.3 4.0 - 10.5 K/uL   RBC 3.62 (L) 4.22 - 5.81 MIL/uL   Hemoglobin 9.9 (L) 13.0 - 17.0 g/dL   HCT 32.3 (L) 39.0 - 52.0 %   MCV 89.2 78.0 - 100.0 fL   MCH 27.3 26.0 - 34.0 pg   MCHC 30.7 30.0 - 36.0 g/dL   RDW 17.3 (H) 11.5 - 15.5 %   Platelets 323 150 - 400 K/uL  Troponin I     Status: Abnormal   Collection Time: 08/07/15  7:57 PM  Result Value Ref Range   Troponin I 0.05 (HH) <0.03 ng/mL    Comment: CRITICAL RESULT CALLED TO, READ BACK BY AND VERIFIED WITH: CASSIE ALBRIGHT,RN AT 7209 08/07/15 BY ZBEECH. CORRECT TIME 2052 08/07/15.   CBG monitoring, ED     Status: Abnormal   Collection Time: 08/07/15  7:59 PM  Result Value Ref Range   Glucose-Capillary 111 (H) 65 - 99 mg/dL  I-Stat CG4 Lactic Acid, ED     Status: Abnormal   Collection Time: 08/07/15  8:07 PM  Result Value Ref Range   Lactic Acid, Venous 2.01 (HH) 0.5 - 1.9 mmol/L   Comment NOTIFIED PHYSICIAN   I-Stat Chem 8, ED     Status: Abnormal   Collection Time: 08/07/15  8:08 PM  Result Value Ref Range   Sodium 133 (L) 135 - 145 mmol/L   Potassium 4.7 3.5 - 5.1 mmol/L   Chloride 98 (L) 101 - 111 mmol/L   BUN 60 (H) 6 - 20 mg/dL   Creatinine, Ser 3.80 (H) 0.61 - 1.24 mg/dL   Glucose, Bld 117 (H) 65 - 99 mg/dL   Calcium, Ion 0.96 (L) 1.12 - 1.23 mmol/L   TCO2 26 0 - 100 mmol/L   Hemoglobin 10.5 (L) 13.0 - 17.0 g/dL   HCT 31.0 (L) 39.0 - 52.0 %  Protime-INR     Status: Abnormal   Collection Time: 08/07/15  8:10 PM  Result Value Ref Range   Prothrombin Time 43.2 (H) 11.6 - 15.2 seconds   INR 4.75 (HH) 0.00 - 1.49    Comment: REPEATED TO VERIFY CRITICAL RESULT CALLED TO, READ BACK BY AND VERIFIED WITH: CASSIE ALBRIGHT RN ON 7.2.2017 AT 2135 BY SHORT TIFFANY   Gram stain     Status: None   Collection Time: 08/07/15  8:12 PM  Result Value Ref Range   Specimen Description URINE, RANDOM    Special Requests NONE    Gram Stain      WBC PRESENT, PREDOMINANTLY MONONUCLEAR GRAM NEGATIVE  RODS BUDDING YEAST SEEN GRAM POSITIVE COCCI IN CHAINS CYTOSPIN SMEAR    Report Status 08/07/2015 FINAL   I-Stat arterial blood gas, ED ([place], [facility])     Status: Abnormal   Collection Time: 08/07/15  8:26 PM  Result Value Ref Range   pH, Arterial 7.519 (H) 7.350 - 7.450   pCO2 arterial 30.2 (L) 35.0 - 45.0 mmHg   pO2, Arterial 62.0 (L) 80.0 - 100.0 mmHg  Bicarbonate 24.7 (H) 20.0 - 24.0 mEq/L   TCO2 26 0 - 100 mmol/L   O2 Saturation 94.0 %   Acid-Base Excess 2.0 0.0 - 2.0 mmol/L   Patient temperature 98.0 F    Collection site RADIAL, ALLEN'S TEST ACCEPTABLE    Drawn by Operator    Sample type ARTERIAL   Urinalysis, Routine w reflex microscopic (not at [facility])     Status: Abnormal   Collection Time: 08/07/15  9:39 PM  Result Value Ref Range   Color, Urine YELLOW YELLOW   APPearance TURBID (A) CLEAR   Specific Gravity, Urine 1.019 1.005 - 1.030   pH 8.0 5.0 - 8.0   Glucose, UA NEGATIVE NEGATIVE mg/dL   Hgb urine dipstick LARGE (A) NEGATIVE   Bilirubin Urine SMALL (A) NEGATIVE   Ketones, ur NEGATIVE NEGATIVE mg/dL   Protein, ur 100 (A) NEGATIVE mg/dL   Nitrite NEGATIVE NEGATIVE   Leukocytes, UA LARGE (A) NEGATIVE  Urine microscopic-add on     Status: Abnormal   Collection Time: 08/07/15  9:39 PM  Result Value Ref Range   Squamous Epithelial / LPF 0-5 (A) NONE SEEN   WBC, UA TOO NUMEROUS TO COUNT 0 - 5 WBC/hpf   RBC / HPF 6-30 0 - 5 RBC/hpf   Bacteria, UA MANY (A) NONE SEEN   Urine-Other YEAST   POC occult blood, ED Provider will collect     Status: Abnormal   Collection Time: 08/07/15  9:50 PM  Result Value Ref Range   Fecal Occult Bld POSITIVE (A) NEGATIVE  CBC     Status: Abnormal   Collection Time: 08/07/15 10:55 PM  Result Value Ref Range   WBC 6.7 4.0 - 10.5 K/uL   RBC 3.18 (L) 4.22 - 5.81 MIL/uL   Hemoglobin 8.8 (L) 13.0 - 17.0 g/dL   HCT 28.6 (L) 39.0 - 52.0 %   MCV 89.9 78.0 - 100.0 fL   MCH 27.7 26.0 - 34.0 pg   MCHC 30.8 30.0 - 36.0 g/dL   RDW 17.5 (H) 11.5 -  15.5 %   Platelets 297 150 - 400 K/uL  Type and screen [place]     Status: None   Collection Time: 08/07/15 10:55 PM  Result Value Ref Range   ABO/RH(D) A POS    Antibody Screen NEG    Sample Expiration 08/10/2015   Troponin I     Status: None   Collection Time: 08/07/15 10:55 PM  Result Value Ref Range   Troponin I <0.03 <0.03 ng/mL  I-Stat CG4 Lactic Acid, ED     Status: None   Collection Time: 08/07/15 11:08 PM  Result Value Ref Range   Lactic Acid, Venous 1.74 0.5 - 1.9 mmol/L  Glucose, capillary     Status: None   Collection Time: 08/08/15  3:43 AM  Result Value Ref Range   Glucose-Capillary 99 65 - 99 mg/dL   Comment 1 Venous Specimen   Protime-INR     Status: Abnormal   Collection Time: 08/08/15  7:04 AM  Result Value Ref Range   Prothrombin Time 47.9 (H) 11.6 - 15.2 seconds   INR 5.44 (HH) 0.00 - 1.49    Comment: REPEATED TO VERIFY CRITICAL RESULT CALLED TO, READ BACK BY AND VERIFIED WITH: [facility] MERLINI,RN AT 9233 08/08/15 BY ZBEECH.   Cortisol     Status: None   Collection Time: 08/08/15  7:04 AM  Result Value Ref Range   Cortisol, Plasma 48.3 ug/dL  Comment: (NOTE) AM    6.7 - 22.6 ug/dL PM   <10.0       ug/dL   Basic metabolic panel     Status: Abnormal   Collection Time: 08/08/15  7:04 AM  Result Value Ref Range   Sodium 136 135 - 145 mmol/L   Potassium 3.8 3.5 - 5.1 mmol/L    Comment: DELTA CHECK NOTED NO VISIBLE HEMOLYSIS    Chloride 105 101 - 111 mmol/L   CO2 21 (L) 22 - 32 mmol/L   Glucose, Bld 106 (H) 65 - 99 mg/dL   BUN 56 (H) 6 - 20 mg/dL   Creatinine, Ser 3.53 (H) 0.61 - 1.24 mg/dL   Calcium 7.6 (L) 8.9 - 10.3 mg/dL   GFR calc non Af Amer 16 (L) >60 mL/min   GFR calc Af Amer 18 (L) >60 mL/min    Comment: (NOTE) The eGFR has been calculated using the CKD EPI equation. This calculation has not been validated in all clinical situations. eGFR's persistently <60 mL/min signify possible Chronic Kidney Disease.    Anion  gap 10 5 - 15  Glucose, capillary     Status: Abnormal   Collection Time: 08/08/15  7:32 AM  Result Value Ref Range   Glucose-Capillary 109 (H) 65 - 99 mg/dL     ROS: see hpi  Physical Exam: Filed Vitals:   08/08/15 0700 08/08/15 0800  BP: 77/57 83/63  Pulse: 85 51  Temp:  97.7 F (36.5 C)  Resp: 16 13     General:  Alert WM / NAD  OX3  /appropriate now  HEENT: Roberts / eomi/ mm dry  Neck: supple no jvd Heart: Alroy Bailiff Kieth Brightly  vr  stable  73  Lungs: Decr BS bilat / Non labored breathing  Abdomen: BS pos / obese NT .with  abd  Wall edema Extremities: Bilat 3+ edema knee to ankle  Skin:  Scattered abrasions  Neuro: alert ox3/ moves all extrem to request  Dialysis Access: pos  Bruit LUA AVF/ R ij Perm cath  No dc or tenderness at exist site   Dialysis Orders: Center: [facility] on MWF . EDW 109.5 kg (but 6/30 left at 106.8 kg )  No sig change in bp/HD Bath 3k/ 2.25 Ca Time 4hrs Heparin none. Access R IJ perm cath and LUA AVF (07/25/15 inserted)   Mircera 26mg q 2weeks (not given at op unit yet )   Other=  OP labs   08/03/15= Transfer sat 22%  Ferritin 1958 / Pth 65/ cor ca 9.5 phos 3.7  hgb 9.5  Assessment/Plan 1. Febrile illness/ Hypotension / sepsis shock /presumed UTI - CCM RX 2. ESRD -   With low bp plan CVVHD ( nl hd mwf ) 3. Hypertension/volume  -  Was on op Midodrine 120mtid /hypotension sec to sepsis/ gross anasarca  CVVHD  4. Anemia  - start ESA  Today  5. Metabolic bone disease -  No vit d  With PTH <100 /  Phos ok  Start binder  6. HO A fib on coumadin -admit INR ^= per admit team  7. Ho prostate CA s/p radiation 2011 and RCC s/p left nephrectomy  Recent prolonged admit  With Klebsiella Bacteriemia / sepsis subsequent cardiac arrest, complicated by left pneumothorax following CPR s/p chest tube placement.    DaErnest HaberPA-C [id number] 060 9620/04/2015, 11:04 AM  I have seen and examined this patient and agree with plan per DaErnest Haber [address]  WM with new ESRD and just Dc'd from hosp to [place] 8d ago returns for fever, low BP and altered MS.  MS now back to baseline but BP remains low.  He has chronic indwelling urinary catheter and also Rt PC both of which are potential sources for infection.   Due to low BP will start CVVHD as well as the fact he has significant edema.  BC pending.  Urine shows yeast and TNTC wbc's.  His overall prognosis is poor. Persephone Schriever T,MD 08/08/2015 12:15 PM

## 2015-08-08 NOTE — Progress Notes (Signed)
Pharmacy Antibiotic Note  [name] is a 75 y.o. male admitted on 08/07/2015 with sepsis and UTI.  Pharmacy has been consulted for vancomycin and cefepime dosing.  Patient with long recent complicated hospitalization.  Hx UTI with klebsiella.  Has chronic foley that is changed monthly.  Received 1500 mg IV vancomycin and cefepime 1g this morning.   Plan: 1. Vancomycin 1g x 1 now to complete loading dose. 2. Then start vancomycin 1g IV q 24 hrs while on CRRT. 3. Increase cefepime to 2g q 12 hrs while on CRRT. 4. Vancomycin levels as indicated.  Height: 5\' 10"  (177.8 cm) Weight: 247 lb 5.7 oz (112.2 kg) IBW/kg (Calculated) : 73  Temp (24hrs), Avg:98.2 F (36.8 C), Min:97.7 F (36.5 C), Max:99.3 F (37.4 C)   Recent Labs Lab 08/07/15 1957 08/07/15 2007 08/07/15 2008 08/07/15 2255 08/07/15 2308 08/08/15 0704  WBC 8.3  --   --  6.7  --   --   CREATININE 3.76*  --  3.80*  --   --  3.53*  LATICACIDVEN  --  2.01*  --   --  1.74  --     Estimated Creatinine Clearance: 23.4 mL/min (by C-G formula based on Cr of 3.53).    No Known Allergies  Antimicrobials this admission: 7/2 Zosyn x 1 7/2 Vanc>> 7/3 Cefepime>> 7/3 Anidulafungin x 1 (poor urine penetration) 7/3 Fluconazole >  Dose adjustments this admission: n/a  Microbiology results: 7/2 BCx x 2 > 7/2 UCx > (gram stain with GNR, budding yeast, GPC)  Thank you for allowing pharmacy to be a part of this patient's care.  Uvaldo Rising, BCPS  Clinical Pharmacist Pager [phone]  08/08/2015 12:47 PM  .

## 2015-08-08 NOTE — Progress Notes (Signed)
[facility] for Heparin when INR </= 2 Indication: h/o DVT/PE and afib  No Known Allergies  Patient Measurements: Height: 5\' 10"  (177.8 cm) Weight: 247 lb 5.7 oz (112.2 kg) IBW/kg (Calculated) : 73  Ht: 69.5 in  Wt: 109.6kg  IBW: 72 kg Heparin Dosing Weight: 96 kg  Vital Signs: Temp: 97.7 F (36.5 C) (07/03 0800) Temp Source: Axillary (07/03 0800) BP: 83/63 mmHg (07/03 0800) Pulse Rate: 51 (07/03 0800)  Labs:  Recent Labs  08/07/15 1957 08/07/15 2008 08/07/15 2010 08/07/15 2255 08/08/15 0704  HGB 9.9* 10.5*  --  8.8*  --   HCT 32.3* 31.0*  --  28.6*  --   PLT 323  --   --  297  --   LABPROT  --   --  43.2*  --  47.9*  INR  --   --  4.75*  --  5.44*  CREATININE 3.76* 3.80*  --   --  3.53*  TROPONINI 0.05*  --   --  <0.03  --     Estimated Creatinine Clearance: 23.4 mL/min (by C-G formula based on Cr of 3.53).   Medical History: Past Medical History  Diagnosis Date  . Obesity   . Phlebitis     Lower extermity  . Pulmonary emboli ([place]) 2008    submassive, saddle  . Prostate cancer ([place]) 07/2009  . Sleep apnea     on CPAP  . Hx of echocardiogram 12/04/2010    Normal EF >55% no significant valve disease  . History of stress test 06/27/2009    Low risk and EF of approximately 50%  . DVT (deep venous thrombosis) ([place])   . Chronic kidney disease, stage 3     baseline creatinine ~1.4  . HLD (hyperlipidemia)   . HTN (hypertension)   . Dysrhythmia     A fib  . Diabetes mellitus ([place])     diet controlled  . History of hiatal hernia     Assessment: 75 y.o. M presents with. Pt on coumadin PTA at SNF for h/o DVT and afib - per SNF MAR, coumadin currently on hold since 6/28 due to elevated INR. INR elevated upon admission to hospital (4.75), continues to rise today although reported last dose of Coumadin PTA was 6/28. Currently holding coumadin and MD order to start heparin when INR < 2.  Goal of Therapy:  INR 2-3 Heparin  level 0.3-0.7 units/ml Monitor platelets by anticoagulation protocol: Yes   Plan:  Daily INR Start heparin when INR </=2  Uvaldo Rising, BCPS  Clinical Pharmacist Pager [phone]  08/08/2015 10:59 AM

## 2015-08-08 NOTE — Progress Notes (Signed)
ANTICOAGULATION CONSULT NOTE - Initial Consult  Pharmacy Consult for Heparin when INR </= 2 Indication: h/o DVT/PE and afib  No Known Allergies  Patient Measurements:    Ht: 69.5 in  Wt: 109.6kg  IBW: 72 kg Heparin Dosing Weight: 96 kg  Vital Signs: Temp: 99.3 F (37.4 C) (07/02 2039) Temp Source: Rectal (07/02 2039) BP: 84/63 mmHg (07/03 0030) Pulse Rate: 83 (07/03 0015)  Labs:  Recent Labs  08/07/15 1957 08/07/15 2008 08/07/15 2010 08/07/15 2255  HGB 9.9* 10.5*  --  8.8*  HCT 32.3* 31.0*  --  28.6*  PLT 323  --   --  297  LABPROT  --   --  43.2*  --   INR  --   --  4.75*  --   CREATININE 3.76* 3.80*  --   --   TROPONINI 0.05*  --   --  <0.03    Estimated Creatinine Clearance: 21.3 mL/min (by C-G formula based on Cr of 3.8).   Medical History: Past Medical History  Diagnosis Date  . Obesity   . Phlebitis     Lower extermity  . Pulmonary emboli ([place]) 2008    submassive, saddle  . Prostate cancer ([place]) 07/2009  . Sleep apnea     on CPAP  . Hx of echocardiogram 12/04/2010    Normal EF >55% no significant valve disease  . History of stress test 06/27/2009    Low risk and EF of approximately 50%  . DVT (deep venous thrombosis) ([place])   . Chronic kidney disease, stage 3     baseline creatinine ~1.4  . HLD (hyperlipidemia)   . HTN (hypertension)   . Dysrhythmia     A fib  . Diabetes mellitus ([place])     diet controlled  . History of hiatal hernia     Medications:  Prescriptions prior to admission  Medication Sig Dispense Refill Last Dose  . Amino Acids-Protein Hydrolys (FEEDING SUPPLEMENT, PRO-STAT SUGAR FREE 64,) LIQD Take 30 mLs by mouth 3 (three) times daily with meals. 900 mL 0 08/07/2015 at 1200  . fenofibrate (TRICOR) 145 MG tablet Take 145 mg by mouth daily.    08/07/2015 at 800  . ferrous sulfate 325 (65 FE) MG EC tablet take 1 tablet by mouth once daily 30 tablet 6 08/07/2015 at 800  . guaiFENesin (ROBITUSSIN) 100 MG/5ML SOLN Take 10 mLs (200 mg total)  by mouth every 8 (eight) hours. 473 mL 0 08/07/2015 at 800  . HYDROcodone-acetaminophen (NORCO/VICODIN) 5-325 MG tablet Take 1 tablet by mouth every 8 (eight) hours as needed for moderate pain or severe pain (breakthrough pain). (Patient taking differently: Take 1 tablet by mouth every 8 (eight) hours as needed for moderate pain. ) 15 tablet 0 08/06/2015 at 1051  . insulin aspart (NOVOLOG) 100 UNIT/ML injection CBG 121 - 150:2u, CBG 151 - 200:3u, CBG 201 - 250:5u, CBG 251 - 300:8u,CBG 301 - 350:11u, CBG 351 - 400:15u (Patient taking differently: Inject 2-15 Units into the skin 3 (three) times daily with meals. CBG 121-150 2 units, 151-200 3 units, 201-250 5 units, 251-300 8 units, 301-350 11 units, 351-400 15 units) 10 mL 0 08/07/2015 at 1100  . ipratropium-albuterol (DUONEB) 0.5-2.5 (3) MG/3ML SOLN Take 3 mLs by nebulization every 4 (four) hours as needed (shortness of breath/ wheezing).   unknown  . LORazepam (ATIVAN) 1 MG tablet Take 1 tablet (1 mg total) by mouth at bedtime. 10 tablet 0 08/06/2015 at 2100  . menthol-cetylpyridinium (CEPACOL) 3  MG lozenge Take 1 lozenge (3 mg total) by mouth as needed for sore throat. 100 tablet 12 unknown  . midodrine (PROAMATINE) 10 MG tablet Take 1 tablet (10 mg total) by mouth 3 (three) times daily before meals. (Patient taking differently: Take 10 mg by mouth 3 (three) times daily. 6:30am, 11:30am, 16:30pm) 60 tablet 0 08/07/2015 at 1130  . multivitamin (RENA-VIT) TABS tablet Take 1 tablet by mouth at bedtime. 30 each 0 08/06/2015 at 2100  . Nutritional Supplements (NUTRITIONAL SUPPLEMENT PO) Take 120 mLs by mouth every evening. Med Plus 2.0   08/06/2015 at 1600  . nystatin (MYCOSTATIN) 100000 UNIT/ML suspension Take 5 mLs (500,000 Units total) by mouth 4 (four) times daily. (Patient taking differently: Take 5 mLs by mouth 4 (four) times daily. For thrush - swish and swallow) 60 mL 0 08/07/2015 at 1200  . ondansetron (ZOFRAN) 4 MG tablet Take 4 mg by mouth every 6 (six) hours as  needed for nausea or vomiting.   unknown  . pantoprazole (PROTONIX) 40 MG tablet Take 1 tablet (40 mg total) by mouth 2 (two) times daily before a meal. After 08/12/15 take once a day 30 tablet 0 08/07/2015 at 800  . polyvinyl alcohol (LIQUIFILM TEARS) 1.4 % ophthalmic solution Place 1 drop into both eyes 2 (two) times daily as needed for dry eyes.   unknown  . PRESCRIPTION MEDICATION Inhale into the lungs at bedtime. CPAP   08/07/2015 at Unknown time  . Maltodextrin-Xanthan Gum (RESOURCE THICKENUP CLEAR) POWD Take 1 g by mouth as needed. 125 g 0 unknown  . Nutritional Supplements (FEEDING SUPPLEMENT, NEPRO CARB STEADY,) LIQD Take 237 mLs by mouth daily at 3 pm. 21 Can 0 unknown  . warfarin (COUMADIN) 5 MG tablet Take 1 tablet (5 mg total) by mouth one time only at 6 PM. (Patient taking differently: Take 5 mg by mouth every evening. ) 10 tablet 0 08/03/2015    Assessment: 75 y.o. M presents with. Pt on coumadin PTA at SNF for h/o DVT and afib - per SNF MAR, coumadin currently on hold since 6/28 due to elevated INR. INR remains elevated upon admission to hospital (4.75). Currently holding coumadin and MD order to start heparin when INR 2-3 per protocol; our protocol would be to start heparin when INR </=2.   Goal of Therapy:  INR 2-3 Heparin level 0.3-0.7 units/ml Monitor platelets by anticoagulation protocol: Yes   Plan:  Daily INR Start heparin when INR </=2  Sherlon Handing, PharmD, BCPS Clinical pharmacist, pager [phone] 08/08/2015,2:12 AM

## 2015-08-08 NOTE — Progress Notes (Signed)
PULMONARY / CRITICAL CARE MEDICINE   Name: [name] MRN: [id number] DOB: May 15, 1940    ADMISSION DATE:  08/07/2015 CONSULTATION DATE:  08/08/15  REFERRING MD:  Olevia Bowens [facility])  CHIEF COMPLAINT:  Weakness  HISTORY OF PRESENT ILLNESS:  Pt is encephelopathic; therefore, this HPI is obtained from chart review. [name] is a 75 y.o. male with PMH as outlined below. He had recent prolonged extensive admission 05/31/15 through 07/31/15 for multiple problems including but not limited to septic shock due to Klebsiella bacteremia  Pyelonephritis/UTI complicated by hemorrhagic shock due to coagulopathy with subsequent cardiac arrest, complicated by left pneumothorax following CPR s/p chest tube placement.  He was eventually discharged to SNF on 07/31/15; however, he returned to [facility] ED 08/07/15 with fatigue, somnolence, fever.  He was found to have UTI and was initially being admitted to SDU as he had responded well to 77ml/kg bolus; however, he later developed hypotension along with significant third spacing.  He was started on albumin and due to persistent hypotension, PCCM was asked to admit to ICU for septic shock.  Pt's wife states that on day of presentation, pt also had a cough that was productive of foul smelling brown sputum.  He also complained of chest tightness while coughing.  Besides fever on day of presentation, no hx of fevers/chills/sweats, N/V/D, abd pain, myalgias.  No exposures to known sick contacts.  Of note, he has hx of ESBL Klebsiella UTI as well as Klebsiella bacteremia.  SUBJECTIVE:   Borderline BP  VITAL SIGNS: BP 83/63 mmHg  Pulse 51  Temp(Src) 97.7 F (36.5 C) (Axillary)  Resp 13  Ht 5\' 10"  (1.778 m)  Wt 112.2 kg (247 lb 5.7 oz)  BMI 35.49 kg/m2  SpO2 92%  HEMODYNAMICS:    VENTILATOR SETTINGS:    INTAKE / OUTPUT: I/O last 3 completed shifts: In: 3260 [I.V.:3050; IV Piggyback:210] Out: 125 [Urine:125]   PHYSICAL EXAMINATION: General: Chronically  ill appearing male, in NAD. Neuro: Awake, oriented x 4 HEENT: [place]/AT. PERRL Cardiovascular: IRIR, no M/R/G.  Lungs: reduced  Abdomen: Obese.  Abd soft, NT/ND.  Musculoskeletal: No gross deformities, 3+ edema to bilateral LE's up to thighs, 1+ edema to bilateral UE's.. Skin: Intact, warm, no rashes.  LABS:  BMET  Recent Labs Lab 08/07/15 1957 08/07/15 2008 08/08/15 0704  NA 135 133* 136  K 4.8 4.7 3.8  CL 99* 98* 105  CO2 24  --  21*  BUN 55* 60* 56*  CREATININE 3.76* 3.80* 3.53*  GLUCOSE 120* 117* 106*    Electrolytes  Recent Labs Lab 08/07/15 1957 08/08/15 0704  CALCIUM 7.9* 7.6*    CBC  Recent Labs Lab 08/07/15 1957 08/07/15 2008 08/07/15 2255  WBC 8.3  --  6.7  HGB 9.9* 10.5* 8.8*  HCT 32.3* 31.0* 28.6*  PLT 323  --  297    Coag's  Recent Labs Lab 08/07/15 2010 08/08/15 0704  INR 4.75* 5.44*    Sepsis Markers  Recent Labs Lab 08/07/15 2007 08/07/15 2308  LATICACIDVEN 2.01* 1.74    ABG  Recent Labs Lab 08/07/15 2026  PHART 7.519*  PCO2ART 30.2*  PO2ART 62.0*    Liver Enzymes  Recent Labs Lab 08/07/15 1957  AST 29  ALT 20  ALKPHOS 115  BILITOT 0.8  ALBUMIN 1.5*    Cardiac Enzymes  Recent Labs Lab 08/07/15 1957 08/07/15 2255  TROPONINI 0.05* <0.03    Glucose  Recent Labs Lab 08/07/15 1959 08/08/15 0343 08/08/15 0732  GLUCAP 111*  99 109*    Imaging Dg Chest 1 View  08/08/2015  CLINICAL DATA:  75 year old male with fever and lethargy. Possible Healthcare associated pneumonia. EXAM: CHEST 1 VIEW COMPARISON:  08/07/2015 and earlier. FINDINGS: Portable AP supine view at 0459 hours. Stable right IJ dual-lumen dialysis type catheter. Stable cardiac size and mediastinal contours. Stable lung volumes. Interval increased opacity at the left lung base, now mostly obscuring the left hemidiaphragm. Stable ventilation elsewhere. No pneumothorax. No acute pulmonary edema. Left lateral fourth rib deformity is stable since  May. Healing left seventh and eighth rib fractures again noted. No new osseous abnormality identified. IMPRESSION: 1. Increasing opacity at the left lung base which could reflect pneumonia or small pleural effusion with atelectasis in this setting. 2. No other acute cardiopulmonary abnormality. Electronically Signed   By: Genevie Ann M.D.   On: 08/08/2015 07:13   Dg Chest Port 1 View  08/07/2015  CLINICAL DATA:  Fever and lethargy. EXAM: PORTABLE CHEST 1 VIEW COMPARISON:  07/20/2015 FINDINGS: Right IJ dialysis catheter unchanged with tip over the SVC. Lungs are hypoinflated with mild hazy prominence of the perihilar vessels suggesting mild vascular congestion. Moderate stable cardiomegaly. Fractures of the left lateral third and fourth ribs unchanged. Possible fracture of the anterior left second rib. Remainder of the exam is unchanged. IMPRESSION: Cardiomegaly with suggestion of mild vascular congestion. Known left-sided rib fractures as described. Electronically Signed   By: Marin Olp M.D.   On: 08/07/2015 20:32     STUDIES:  CXR 07/02 > cardiomegaly with mild congestion.  CULTURES: Blood 07/02 > Urine 07/02 > Sputum 07/02 >  ANTIBIOTICS: Vanc 07/02 >>> Cefepime 07/02 >>> Anidulo 7/3>>>  SIGNIFICANT EVENTS: 07/02 > admitted with septic shock due to UTI.  LINES/TUBES: None.  DISCUSSION: 75 y.o. M with recent prolonged hospital admission due to multiple issues including but not limited to septic shock due to Klebsiella bacteremia pyelonephritis/UTI complicated by hemorrhagic shock due to coagulopathy with subsequent cardiac arrest, complicated by left pneumothorax following CPR s/p chest tube placement. Pt also had DVT during prolonged hospitalization for which he was discharged on coumadin.  He is now being admitted with septic shock due to UTI.  ASSESSMENT / PLAN:  CARDIOVASCULAR A:  Septic shock - due to UTI.  Lactate reassuring.  BP gradually responding to albumin. Hx A.fib (on  warfarin) DVT, HLD, HTN. Relative brady P:  Dc albumin (50g total) Assess cortisol more then adequate, no steroids needed Continue outpatient midodrine. Avoiding CVL for now given elevated INR, not on pressors, may be able to avoid heparin gtt once INR nearing 2 Hold outpatient warfarin, fenofibrate. If pressors needed, map less 55, would add dopamine beta affect  INFECTIOUS A:   Septic shock - due to UTI. Possible HCAP with recent cough. Lactate reassuring. P:   Abx as above (vanc / cefepime).  Follow cultures as above. PT HAS HAD HIS FOLEY FOR 10 YRS NOW. HE HAS PROSTATE ISSUES. UROLOGIST CHANGES HIS FOLEY MONTHLY. HE IS DUE TO BE CHANGED THIS TIME Add antifungals given stain  PULMONARY A: Acute on chronic hypoxic respiratory failure. Concern for HCAP - given foul smelling sputum.  Hx OSA - on CPAP. Hx PE (2008). P:   Continue supplemental O2 as needed to maintain SpO2 > 92%. Abx / cultures per ID section. Pulmonary hygiene. Continue nocturnal CPAP. CXR in AM. pulmicort BID and duoneb qid.  RENAL A:   ESRD (M/W/F). Hyponatremia. Hypocalcemia. P:   Will call renal, cvvhd??  GASTROINTESTINAL A:  Fecal occult positive. Nutrition. P:   Start diet  HEMATOLOGIC A:   Warfarin induced coagulopathy. Anemia - chronic. VTE Prophylaxis. Hx hemorrhagic shock (duet o right perinephric hematoma s/p embolization) with subsequent cardiac arrest. P:  Observing for now. No signs of bleeding. Holding off FFP unless with bleeding.  Hold warfarin. Repeat coags in AM. Transfuse for Hgb < 7. SCD's only. CBC in AM.  ENDOCRINE A:   DM.   No rel AI P:   SSI.  NEUROLOGIC A:   Acute encephalopathy likely related to sepsis.  P:   Monitor clinically. Avoid sedating meds.  Family updated: Wife updated at bedside by DF   Interdisciplinary Family Meeting v Palliative Care Meeting:  Due by: 08/14/15.  CC time: 50 minutes.  Lavon Paganini. Titus Mould, MD, [place] Pgr:  [facility]

## 2015-08-08 NOTE — Progress Notes (Signed)
Pt is on NIV at this time tolerating it well no distress or complications noted. O2 humidity provided in the reservoir of the NIV machine

## 2015-08-08 NOTE — H&P (Addendum)
PULMONARY / CRITICAL CARE MEDICINE   Name: RETT SINGLETON MRN: [id number] DOB: Sep 29, 1940    ADMISSION DATE:  08/07/2015 CONSULTATION DATE:  08/08/15  REFERRING MD:  Olevia Bowens [facility])  CHIEF COMPLAINT:  Weakness  HISTORY OF PRESENT ILLNESS:  Pt is encephelopathic; therefore, this HPI is obtained from chart review. [name] is a 75 y.o. male with PMH as outlined below. He had recent prolonged extensive admission 05/31/15 through 07/31/15 for multiple problems including but not limited to septic shock due to Klebsiella bacteremia  Pyelonephritis/UTI complicated by hemorrhagic shock due to coagulopathy with subsequent cardiac arrest, complicated by left pneumothorax following CPR s/p chest tube placement.  He was eventually discharged to SNF on 07/31/15; however, he returned to [facility] ED 08/07/15 with fatigue, somnolence, fever.  He was found to have UTI and was initially being admitted to SDU as he had responded well to 102ml/kg bolus; however, he later developed hypotension along with significant third spacing.  He was started on albumin and due to persistent hypotension, PCCM was asked to admit to ICU for septic shock.  Pt's wife states that on day of presentation, pt also had a cough that was productive of foul smelling brown sputum.  He also complained of chest tightness while coughing.  Besides fever on day of presentation, no hx of fevers/chills/sweats, N/V/D, abd pain, myalgias.  No exposures to known sick contacts.  Of note, he has hx of ESBL Klebsiella UTI as well as Klebsiella bacteremia.  PAST MEDICAL HISTORY :  He  has a past medical history of Obesity; Phlebitis; Pulmonary emboli ([place]) (2008); Prostate cancer ([place]) (07/2009); Sleep apnea; echocardiogram (12/04/2010); History of stress test (06/27/2009); DVT (deep venous thrombosis) ([place]); Chronic kidney disease, stage 3; HLD (hyperlipidemia); HTN (hypertension); Dysrhythmia; Diabetes mellitus ([place]); and History of hiatal hernia.  PAST  SURGICAL HISTORY: He  has past surgical history that includes Nephrectomy (1999); Cholecystectomy (1999); Transurethral resection of bladder tumor (N/A, 03/04/2013); Cystoscopy w/ retrogrades (N/A, 04/08/2013); Left shoulder repair; Shoulder open rotator cuff repair (Right, 05/03/2015); and AV fistula placement (Left, 07/25/2015).  No Known Allergies  No current facility-administered medications on file prior to encounter.   Current Outpatient Prescriptions on File Prior to Encounter  Medication Sig  . Amino Acids-Protein Hydrolys (FEEDING SUPPLEMENT, PRO-STAT SUGAR FREE 64,) LIQD Take 30 mLs by mouth 3 (three) times daily with meals.  . fenofibrate (TRICOR) 145 MG tablet Take 145 mg by mouth daily.   . ferrous sulfate 325 (65 FE) MG EC tablet take 1 tablet by mouth once daily  . guaiFENesin (ROBITUSSIN) 100 MG/5ML SOLN Take 10 mLs (200 mg total) by mouth every 8 (eight) hours.  Marland Kitchen HYDROcodone-acetaminophen (NORCO/VICODIN) 5-325 MG tablet Take 1 tablet by mouth every 8 (eight) hours as needed for moderate pain or severe pain (breakthrough pain). (Patient taking differently: Take 1 tablet by mouth every 8 (eight) hours as needed for moderate pain. )  . insulin aspart (NOVOLOG) 100 UNIT/ML injection CBG 121 - 150:2u, CBG 151 - 200:3u, CBG 201 - 250:5u, CBG 251 - 300:8u,CBG 301 - 350:11u, CBG 351 - 400:15u (Patient taking differently: Inject 2-15 Units into the skin 3 (three) times daily with meals. CBG 121-150 2 units, 151-200 3 units, 201-250 5 units, 251-300 8 units, 301-350 11 units, 351-400 15 units)  . LORazepam (ATIVAN) 1 MG tablet Take 1 tablet (1 mg total) by mouth at bedtime.  Marland Kitchen menthol-cetylpyridinium (CEPACOL) 3 MG lozenge Take 1 lozenge (3 mg total) by mouth as needed for sore  throat.  . midodrine (PROAMATINE) 10 MG tablet Take 1 tablet (10 mg total) by mouth 3 (three) times daily before meals. (Patient taking differently: Take 10 mg by mouth 3 (three) times daily. 6:30am, 11:30am, 16:30pm)  .  multivitamin (RENA-VIT) TABS tablet Take 1 tablet by mouth at bedtime.  Marland Kitchen nystatin (MYCOSTATIN) 100000 UNIT/ML suspension Take 5 mLs (500,000 Units total) by mouth 4 (four) times daily. (Patient taking differently: Take 5 mLs by mouth 4 (four) times daily. For thrush - swish and swallow)  . pantoprazole (PROTONIX) 40 MG tablet Take 1 tablet (40 mg total) by mouth 2 (two) times daily before a meal. After 08/12/15 take once a day  . polyvinyl alcohol (LIQUIFILM TEARS) 1.4 % ophthalmic solution Place 1 drop into both eyes 2 (two) times daily as needed for dry eyes.  . Maltodextrin-Xanthan Gum (RESOURCE THICKENUP CLEAR) POWD Take 1 g by mouth as needed.  . Nutritional Supplements (FEEDING SUPPLEMENT, NEPRO CARB STEADY,) LIQD Take 237 mLs by mouth daily at 3 pm.  . warfarin (COUMADIN) 5 MG tablet Take 1 tablet (5 mg total) by mouth one time only at 6 PM. (Patient taking differently: Take 5 mg by mouth every evening. )    FAMILY HISTORY:  His indicated that his mother is deceased. He indicated that his father is deceased. He indicated that his maternal grandmother is deceased. He indicated that his maternal grandfather is deceased. He indicated that his paternal grandmother is deceased. He indicated that his paternal grandfather is deceased.   SOCIAL HISTORY: He  reports that he has never smoked. He has never used smokeless tobacco. He reports that he drinks alcohol. He reports that he does not use illicit drugs.  REVIEW OF SYSTEMS:  Unable to obtain as pt is encephalopathic initially.  Later on, I was able to speak to pt (Dr. Corrie Dandy) : Pt states he felt unwell over the weekend with malaise and poor PO intake. Had cough as well. Denied fevers, chills, SOB.  Has been tolerating his HD MWF.  Rest of ROS 14 pt was (-) other than the ones mentioned.   SUBJECTIVE:  Nods head "NO" when asked if in pain, having chest pain, SOB Pt states he is comfortable, not in distress.     VITAL SIGNS: BP 83/60  mmHg  Pulse 98  Temp(Src) 99.3 F (37.4 C) (Rectal)  Resp 21  SpO2 89%  HEMODYNAMICS:    VENTILATOR SETTINGS:    INTAKE / OUTPUT:     PHYSICAL EXAMINATION: General: Chronically ill appearing male, in NAD. Neuro: Awake, oriented x 3.  HEENT: [place]/AT. PERRL, sclerae anicteric. Soft voice likely related to VC injury with prolonged intubation. (-) oral thrush Cardiovascular: IRIR, no M/R/G.  Lungs: Respirations even and unlabored.  CTA bilaterally, No W/R/R.  Abdomen: Obese.  Abd soft, NT/ND.  Musculoskeletal: No gross deformities, 3+ edema to bilateral LE's up to thighs, 1+ edema to bilateral UE's.. Skin: Intact, warm, no rashes.  LABS:  BMET  Recent Labs Lab 08/07/15 1957 08/07/15 2008  NA 135 133*  K 4.8 4.7  CL 99* 98*  CO2 24  --   BUN 55* 60*  CREATININE 3.76* 3.80*  GLUCOSE 120* 117*    Electrolytes  Recent Labs Lab 08/07/15 1957  CALCIUM 7.9*    CBC  Recent Labs Lab 08/07/15 1957 08/07/15 2008 08/07/15 2255  WBC 8.3  --  6.7  HGB 9.9* 10.5* 8.8*  HCT 32.3* 31.0* 28.6*  PLT 323  --  297  Coag's  Recent Labs Lab 08/07/15 2010  INR 4.75*    Sepsis Markers  Recent Labs Lab 08/07/15 2007 08/07/15 2308  LATICACIDVEN 2.01* 1.74    ABG  Recent Labs Lab 08/07/15 2026  PHART 7.519*  PCO2ART 30.2*  PO2ART 62.0*    Liver Enzymes  Recent Labs Lab 08/07/15 1957  AST 29  ALT 20  ALKPHOS 115  BILITOT 0.8  ALBUMIN 1.5*    Cardiac Enzymes  Recent Labs Lab 08/07/15 1957 08/07/15 2255  TROPONINI 0.05* <0.03    Glucose  Recent Labs Lab 08/07/15 1959  GLUCAP 111*    Imaging Dg Chest Port 1 View  08/07/2015  CLINICAL DATA:  Fever and lethargy. EXAM: PORTABLE CHEST 1 VIEW COMPARISON:  07/20/2015 FINDINGS: Right IJ dialysis catheter unchanged with tip over the SVC. Lungs are hypoinflated with mild hazy prominence of the perihilar vessels suggesting mild vascular congestion. Moderate stable cardiomegaly. Fractures of  the left lateral third and fourth ribs unchanged. Possible fracture of the anterior left second rib. Remainder of the exam is unchanged. IMPRESSION: Cardiomegaly with suggestion of mild vascular congestion. Known left-sided rib fractures as described. Electronically Signed   By: Marin Olp M.D.   On: 08/07/2015 20:32     STUDIES:  CXR 07/02 > cardiomegaly with mild congestion.  CULTURES: Blood 07/02 > Urine 07/02 > Sputum 07/02 >  ANTIBIOTICS: Vanc 07/02 > Cefepime 07/02 >  SIGNIFICANT EVENTS: 07/02 > admitted with septic shock due to UTI.  LINES/TUBES: None.  DISCUSSION: 75 y.o. M with recent prolonged hospital admission due to multiple issues including but not limited to septic shock due to Klebsiella bacteremia pyelonephritis/UTI complicated by hemorrhagic shock due to coagulopathy with subsequent cardiac arrest, complicated by left pneumothorax following CPR s/p chest tube placement. Pt also had DVT during prolonged hospitalization for which he was discharged on coumadin.  He is now being admitted with septic shock due to UTI.  ASSESSMENT / PLAN:  CARDIOVASCULAR A:  Septic shock - due to UTI.  Lactate reassuring.  BP gradually responding to albumin. Troponin leak - resolved.  Presumed due to demand ischemia. Hx A.fib (on warfarin) DVT, HLD, HTN. P:  Continue albumin (50g total). Assess cortisol. Start empiric stress steroids. Will consider discontinuing in 24 hrs if with no significant effect on BP.  Continue outpatient midodrine. Avoiding CVL for now given elevated INR. Start heparin gtt once INR nearing 2. Will ask pharmacy to dose heparin drip.  Hold outpatient warfarin, fenofibrate.  INFECTIOUS A:   Septic shock - due to UTI. Possible HCAP with recent cough. Lactate reassuring. P:   Abx as above (vanc / cefepime).  Follow cultures as above. PT HAS HAD HIS FOLEY FOR 10 YRS NOW. HE HAS PROSTATE ISSUES. UROLOGIST CHANGES HIS FOLEY MONTHLY. HE IS DUE TO BE  CHANGED THIS TIME. Will defer to AM team to decide if they want to remove foley. I suggest to keep foley and call urologist in the morning. He sees [facility] urology.   PULMONARY A: Acute on chronic hypoxic respiratory failure. Concern for HCAP - given foul smelling sputum.  Hx OSA - on CPAP. Hx PE (2008). P:   Continue supplemental O2 as needed to maintain SpO2 > 92%. Abx / cultures per ID section. Pulmonary hygiene. Continue nocturnal CPAP. CXR in AM. pulmicort BID and duoneb qid. Start incentive spirometry.   RENAL A:   ESRD (M/W/F). Hyponatremia. Hypocalcemia. P:   Day team to please consult nephrology. May require CRRT if BP not improved (  avoiding CVL / HD cath for now given elevated INR). BMP in AM.  GASTROINTESTINAL A:   Fecal occult positive. Nutrition. P:   Monitor closely for any signs of gross bleeding. NPO. Nutrition consult prior to d/c for calorie counts / diet education.  HEMATOLOGIC A:   Warfarin induced coagulopathy. Anemia - chronic. VTE Prophylaxis. Hx hemorrhagic shock (duet o right perinephric hematoma s/p embolization) with subsequent cardiac arrest. P:  Observing for now. No signs of bleeding. Holding off FFP unless with bleeding.  Hold warfarin. Repeat coags in AM. Transfuse for Hgb < 7. SCD's only. CBC in AM.  ENDOCRINE A:   DM.   P:   SSI.  NEUROLOGIC A:   Acute encephalopathy likely related to sepsis.  P:   Monitor clinically. Avoid sedating meds.  Family updated: Wife updated at bedside.  Interdisciplinary Family Meeting v Palliative Care Meeting:  Due by: 08/14/15.  CC time: 40 minutes.   Montey Hora, [place] Pul[facility] Medicine Pager: [phone]  or [phone] 08/08/2015, 12:18 AM   ATTENDING NOTE / ATTESTATION NOTE :   I have discussed the case with the resident/APP  Rahul Desai.   I agree with the resident/APP's  history, physical examination, assessment, and plans.    I  have edited the above note and modified it according to our agreed history, physical examination, assessment and plan.   As above. Patient had a two-month admission at [facility]. Very complicated case. Was at the rehabilitation for a week. Over the weekend, with generalized feeling unwell, poor by mouth intake, cough. Patient was brought to ED. Patient became hypotensive at the ER. Has received albumin and the blood pressure is responding slowly. Prior to that, he also received 3 L of fluid. Source of infection is likely urinary tract although he has a chronic Foley catheter for which urine will always look dirty. Possible healthcare associated pneumonia with the recent cough. CXR was not too impressive. Patient was seen, comfortable. Not in distress. BP 75/50 MAP 60, HR 80, RR 20, sats 94% on 2L. some crackles at the bases. Has gr 3 edema. Rest of exam as above.   Plan to keep patient on broad-spectrum antibiotics. He has received adequate fluid resuscitation. He is currently on albumin infusion. I think his blood pressure runs low chronically as he is on midodrine. In the morning, we need to call renal service for hemodialysis as he has it Monday Wednesday Friday. We also need to call urology as he has chronic Foley catheter and needs to be changed at this time. Morning team will determine whether he still needs his Foley or not. Patient states he still makes urine and they have decided to keep the Foley in.  I have spent 31 minutes of critical care time with this patient today.  Family :  No family at bedside.    Monica Becton, MD 08/08/2015, 1:48 AM  Pulmonary and Critical Care Pager [phone] After 3 pm or if no answer, call [phone]

## 2015-08-09 DIAGNOSIS — N189 Chronic kidney disease, unspecified: Secondary | ICD-10-CM | POA: Insufficient documentation

## 2015-08-09 DIAGNOSIS — A419 Sepsis, unspecified organism: Secondary | ICD-10-CM | POA: Insufficient documentation

## 2015-08-09 DIAGNOSIS — R6520 Severe sepsis without septic shock: Secondary | ICD-10-CM

## 2015-08-09 DIAGNOSIS — J9601 Acute respiratory failure with hypoxia: Secondary | ICD-10-CM

## 2015-08-09 LAB — GLUCOSE, CAPILLARY
GLUCOSE-CAPILLARY: 140 mg/dL — AB (ref 65–99)
GLUCOSE-CAPILLARY: 148 mg/dL — AB (ref 65–99)
GLUCOSE-CAPILLARY: 101 mg/dL — AB (ref 65–99)
Glucose-Capillary: 113 mg/dL — ABNORMAL HIGH (ref 65–99)
Glucose-Capillary: 105 mg/dL — ABNORMAL HIGH (ref 65–99)

## 2015-08-09 LAB — RENAL FUNCTION PANEL
ALBUMIN: 1.7 g/dL — AB (ref 3.5–5.0)
ALBUMIN: 1.8 g/dL — AB (ref 3.5–5.0)
ANION GAP: 7 (ref 5–15)
ANION GAP: 9 (ref 5–15)
BUN: 38 mg/dL — ABNORMAL HIGH (ref 6–20)
BUN: 28 mg/dL — AB (ref 6–20)
CALCIUM: 7.8 mg/dL — AB (ref 8.9–10.3)
CALCIUM: 7.9 mg/dL — AB (ref 8.9–10.3)
CO2: 25 mmol/L (ref 22–32)
CO2: 24 mmol/L (ref 22–32)
Chloride: 105 mmol/L (ref 101–111)
Chloride: 102 mmol/L (ref 101–111)
Creatinine, Ser: 2.32 mg/dL — ABNORMAL HIGH (ref 0.61–1.24)
Creatinine, Ser: 1.8 mg/dL — ABNORMAL HIGH (ref 0.61–1.24)
GFR calc Af Amer: 41 mL/min — ABNORMAL LOW (ref >60)
GFR calc non Af Amer: 36 mL/min — ABNORMAL LOW (ref >60)
GFR, EST AFRICAN AMERICAN: 30 mL/min — AB (ref >60)
GFR, EST NON AFRICAN AMERICAN: 26 mL/min — AB (ref >60)
GLUCOSE: 108 mg/dL — AB (ref 65–99)
GLUCOSE: 164 mg/dL — AB (ref 65–99)
PHOSPHORUS: 3.4 mg/dL (ref 2.5–4.6)
PHOSPHORUS: 2.2 mg/dL — AB (ref 2.5–4.6)
POTASSIUM: 3.9 mmol/L (ref 3.5–5.1)
POTASSIUM: 4.1 mmol/L (ref 3.5–5.1)
SODIUM: 137 mmol/L (ref 135–145)
Sodium: 135 mmol/L (ref 135–145)

## 2015-08-09 LAB — PROTIME-INR
INR: 5.75 (ref 0.00–1.49)
Prothrombin Time: 49.9 seconds — ABNORMAL HIGH (ref 11.6–15.2)

## 2015-08-09 LAB — CBC
HCT: 26.8 % — ABNORMAL LOW (ref 39.0–52.0)
Hemoglobin: 8.2 g/dL — ABNORMAL LOW (ref 13.0–17.0)
MCH: 27.3 pg (ref 26.0–34.0)
MCHC: 30.6 g/dL (ref 30.0–36.0)
MCV: 89.3 fL (ref 78.0–100.0)
Platelets: 300 10*3/uL (ref 150–400)
RBC: 3 MIL/uL — ABNORMAL LOW (ref 4.22–5.81)
RDW: 17.4 % — AB (ref 11.5–15.5)
WBC: 4.8 10*3/uL (ref 4.0–10.5)

## 2015-08-09 LAB — MAGNESIUM: Magnesium: 1.9 mg/dL (ref 1.7–2.4)

## 2015-08-09 MED ORDER — INSULIN ASPART 100 UNIT/ML ~~LOC~~ SOLN
0.0000 [IU] | Freq: Three times a day (TID) | SUBCUTANEOUS | Status: DC
  Administered 2015-08-10: 3 [IU] via SUBCUTANEOUS
  Administered 2015-08-10 – 2015-08-13 (×6): 2 [IU] via SUBCUTANEOUS
  Administered 2015-08-15 – 2015-08-16 (×2): 3 [IU] via SUBCUTANEOUS
  Administered 2015-08-17: 2 [IU] via SUBCUTANEOUS
  Administered 2015-08-18: 3 [IU] via SUBCUTANEOUS
  Administered 2015-08-18: 2 [IU] via SUBCUTANEOUS
  Administered 2015-08-19 – 2015-08-21 (×3): 3 [IU] via SUBCUTANEOUS
  Administered 2015-08-22: 2 [IU] via SUBCUTANEOUS

## 2015-08-09 NOTE — Progress Notes (Signed)
CVVHD management.  Tolerating CVVHD so far.  BP remains low but not on pressors.  Metabolically stable.  Pulling 50cc/hr.  Gm neg rods in blood.  The question is...does he still need the foley?  It might be wise to have his urologist see him to make that decision.

## 2015-08-09 NOTE — Progress Notes (Signed)
PULMONARY / CRITICAL CARE MEDICINE   Name: [name] MRN: [id number] DOB: [birth year]    ADMISSION DATE:  08/07/2015 CONSULTATION DATE:  08/08/15  REFERRING MD:  Olevia Bowens [facility]  CHIEF COMPLAINT:  Weakness  HISTORY OF PRESENT ILLNESS:  Pt is encephelopathic; therefore, this HPI is obtained from chart review. [name] is a 75 y.o. male with PMH as outlined below. He had recent prolonged extensive admission 05/31/15 through 07/31/15 for multiple problems including but not limited to septic shock due to Klebsiella bacteremia  Pyelonephritis/UTI complicated by hemorrhagic shock due to coagulopathy with subsequent cardiac arrest, complicated by left pneumothorax following CPR s/p chest tube placement.  He was eventually discharged to SNF on 07/31/15; however, he returned to [facility] ED 08/07/15 with fatigue, somnolence, fever.  He was found to have UTI and was initially being admitted to SDU as he had responded well to 60ml/kg bolus; however, he later developed hypotension along with significant third spacing.  He was started on albumin and due to persistent hypotension, PCCM was asked to admit to ICU for septic shock.  Pt's wife states that on day of presentation, pt also had a cough that was productive of foul smelling brown sputum.  He also complained of chest tightness while coughing.  Besides fever on day of presentation, no hx of fevers/chills/sweats, N/V/D, abd pain, myalgias.  No exposures to known sick contacts.  Of note, he has hx of ESBL Klebsiella UTI as well as Klebsiella bacteremia.  SUBJECTIVE:   BP 0000000 systolic. Not on pressors. Tolerating CVVH. (-) other issues  VITAL SIGNS: BP 105/70 mmHg  Pulse 83  Temp(Src) 97.3 F (36.3 C) (Oral)  Resp 21  Ht 5\' 10"  (1.778 m)  Wt 112.1 kg (247 lb 2.2 oz)  BMI 35.46 kg/m2  SpO2 99%  HEMODYNAMICS:    VENTILATOR SETTINGS:    INTAKE / OUTPUT: I/O last 3 completed shifts: In: 4160 [P.O.:270; I.V.:3050; IV Piggyback:840] Out:  [id number] [Urine:125; Other:1290]   PHYSICAL EXAMINATION: General: Chronically ill appearing male, in NAD. Neuro: Awake, oriented x 4 HEENT: [place]/AT. PERRL Cardiovascular: IRIR, no M/R/G.  Lungs: reduced. Some crackles at bases Abdomen: Obese.  Abd soft, NT/ND.  Musculoskeletal: No gross deformities, 3+ edema to bilateral LE's up to thighs, 1+ edema to bilateral UE's.. Skin: Intact, warm, no rashes.  LABS:  BMET  Recent Labs Lab 08/08/15 0704 08/08/15 1933 08/08/15 2044 08/09/15 0415  NA 136 136 133* 137  K 3.8 4.0 4.3 3.9  CL 105 100* 103 105  CO2 21*  --  19* 25  BUN 56* 46* 47* 38*  CREATININE 3.53* 2.90* 2.94* 2.32*  GLUCOSE 106* 160* 170* 108*    Electrolytes  Recent Labs Lab 08/08/15 0704 08/08/15 2044 08/09/15 0415  CALCIUM 7.6* 7.7* 7.8*  MG  --   --  1.9  PHOS  --   --  3.4    CBC  Recent Labs Lab 08/07/15 1957  08/07/15 2255 08/08/15 1933 08/09/15 0415  WBC 8.3  --  6.7  --  4.8  HGB 9.9*  < > 8.8* 8.5* 8.2*  HCT 32.3*  < > 28.6* 25.0* 26.8*  PLT 323  --  297  --  300  < > = values in this interval not displayed.  Coag's  Recent Labs Lab 08/07/15 2010 08/08/15 0704 08/09/15 0415  INR 4.75* 5.44* 5.75*    Sepsis Markers  Recent Labs Lab 08/07/15 2007 08/07/15 2308  LATICACIDVEN 2.01* 1.74    ABG  Recent Labs Lab 08/07/15 2026  PHART 7.519*  PCO2ART 30.2*  PO2ART 62.0*    Liver Enzymes  Recent Labs Lab 08/07/15 1957 08/09/15 0415  AST 29  --   ALT 20  --   ALKPHOS 115  --   BILITOT 0.8  --   ALBUMIN 1.5* 1.7*    Cardiac Enzymes  Recent Labs Lab 08/07/15 1957 08/07/15 2255  TROPONINI 0.05* <0.03    Glucose  Recent Labs Lab 08/08/15 1150 08/08/15 1526 08/08/15 1932 08/08/15 1951 08/08/15 2310 08/09/15 0417  GLUCAP 171* 185* 161* 145* 116* 113*    Imaging No results found.   STUDIES:  CXR 07/02 > cardiomegaly with mild congestion.  CULTURES: Blood 07/02 > Kleb pna, Coag (-) staph in 1st  bottle; (-) in 2nd bottle Urine 07/02 > several species Sputum 07/02 > no specimen  ANTIBIOTICS: Vanc 07/02 >>> Cefepime 07/02 >>> Anidulo 7/3>>>  SIGNIFICANT EVENTS: 07/02 > admitted with septic shock due to UTI.  LINES/TUBES: None.  DISCUSSION: 75 y.o. M with recent prolonged hospital admission due to multiple issues including but not limited to septic shock due to Klebsiella bacteremia pyelonephritis/UTI complicated by hemorrhagic shock due to coagulopathy with subsequent cardiac arrest, complicated by left pneumothorax following CPR s/p chest tube placement. Pt also had DVT during prolonged hospitalization for which he was discharged on coumadin.  He is now being admitted with septic shock due to UTI.  ASSESSMENT / PLAN:  CARDIOVASCULAR A:  Septic shock - due to UTI, possible LLL HCAP and Blood culture (+) in 1 bottle with Kleb pna and MR Staph epid > better with IVF and albumin. Not on pressors Hx A.fib (on warfarin) DVT, HLD, HTN. Relative brady P:  Observe BP off pressors and while on CVVH Continue outpatient midodrine. Avoiding CVL for now given elevated INR, not on pressors, may be able to avoid heparin gtt once INR nearing 2 Hold outpatient warfarin, fenofibrate. If pressors needed, map less 55, would add dopamine beta affect  INFECTIOUS A:   Septic shock - due to UTI, LLL HCAP, and Blood culture (+) in 1 bottle with Kleb pna and MR Staph epid > shock better with IVF and albumin. Not on pressors  P:   Cont imipenem and vanc.  Will order blood cultures 7/4. 1/2 bottles ( +) on admission  Follow cultures as above. PT HAS HAD HIS FOLEY FOR 10 YRS NOW. HE HAS PROSTATE ISSUES. UROLOGIST CHANGES HIS FOLEY MONTHLY. HE IS DUE TO BE CHANGED THIS TIME   PULMONARY A: Acute on chronic hypoxic respiratory failure. Concern for HCAP. CXR with possible LLL infiltrate  Hx OSA - on CPAP. Hx PE (2008). P:   Continue supplemental O2 as needed to maintain SpO2 > 92%. Abx /  cultures per ID section. Pulmonary hygiene. Continue nocturnal CPAP. pulmicort BID and duoneb qid.  RENAL A:   ESRD (M/W/F). Hyponatremia. Hypocalcemia. P:   Cont CVVH  GASTROINTESTINAL A:   Fecal occult positive. Nutrition. P:   Cont diet diet  HEMATOLOGIC A:   Warfarin induced coagulopathy. Anemia - chronic. VTE Prophylaxis. Hx hemorrhagic shock (duet o right perinephric hematoma s/p embolization) with subsequent cardiac arrest. P:  Observing for now. No signs of bleeding. Holding off FFP unless with bleeding.  Hold warfarin. Transfuse for Hgb < 7. SCD's only.  ENDOCRINE A:   DM.   No rel AI P:   SSI.  NEUROLOGIC A:   Acute encephalopathy likely related to sepsis. Clinically improved.  P:  Monitor clinically. Avoid sedating meds.  Family updated: Wife updated at bedside by DF 7/3.  No family on 7/4.  Interdisciplinary Family Meeting v Palliative Care Meeting:  Due by: 08/14/15.  CC time: 35  minutes.  Monica Becton, MD 08/09/2015, 9:04 AM [place] Pulmonary and Critical Care Pager [phone] After 3 pm or if no answer, call [phone]

## 2015-08-09 NOTE — Consult Note (Signed)
Consultation Note Date: 08/09/2015   Patient Name: [name]  DOB: 10-26-1940  MRN: [id number]  Age / Sex: 75 y.o., male  PCP: Melinda Crutch Referring Physician: Rush Landmark, MD  Reason for Consultation: Establishing goals of care  HPI/Patient Profile: 75 y.o. male with past medical history of prostate CA s/p radiation 2011 and RCC s/p left nephrectomy,chronic foley cath, sleep apnea on CPAP, prior DVT and saddle PE, CK-MB stage III, HTN, DM. Recent prolonged admission 05/31/2015-07/31/2014 with AKI, Klebsiella bacteremia, peritoneal right perinephric hematoma, cardiac arrest, post CPR left pneumothorax requiring chest tube placement, intubated x 2, and also ESRD and initiated hemodialysis. Admitted on 08/07/2015 with septic shock d/t UTI vs HCAP.  Clinical Assessment and Goals of Care:  Patient has a very strong will to live but poor functional status (deconditioned and now bed ridden), and very poor nutritional status with an albumin of 1.7.  He is on CRRT and requiring midodrine for BP support.  Preliminary blood cultures from 7/2 are positive for multiple types of bacteria.  He has multiple blood clots from his previous hospitalization.  Met with patient wife, 1 of 4 sons, and son's husband at bedside.  Patient is awake and alert.  He tells me that ever since "they brought me back food has tasted really good".  States with out hesitation that he would want CPR and intubation again if it was needed.  I asked if he was worried about anything - His only concern was for his wife possibly needing help with paying the bills.  Patient complains of a spot of numbness / pain in his right chest that he states is worse with breathing. He requests SCDs as they help the edema in his legs.  I then met with his son, son in law and wife in the conference room.  They convey that the patient is an extremely strong willed,  stubborn individual who has an incredibly strong will to live.   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  He is a Tourist information centre manager, with a strong christian background.  He and his wife of many years have 4 sons together.  His wife expresses concern over his health.  She stated that she was worried he may not survive this hospitalization.  We reviewed HCPOA and Living Will.  She felt like her husband would benefit from discussing this things when he is stronger.  She and her son both commented that if we had followed the advanced directives document last hospitalization - the patient  would not have survived.  Consequently they were hesitant to fill them out now.  The son in law in the room seemed to have a different perspective - he stated he would not want prolonged life support in any circumstance based on his families experiences.    The primary decision maker is the patient.       SUMMARY OF RECOMMENDATIONS    Full code   Full scope treatment  PMT will follow to  continue [place] conversations with the patient himself.  We will talk with PCCM to establish reasonable medical targets for the patient that the family can clearly follow - For example - be able to wean from CRRT in 3-5 days and return to HD may be a target.  Will also work with patient to set limits (would you want to be trached/PEG long term?, what is our stopping point?)  Will request nutrition consultation - albumin 1.7 with ESRD  At patient's request will order SCDs for LE edema.   Code Status/Advance Care Planning:  Full code    Symptom Management:   Per primary team.  Palliative Prophylaxis:   Eye Care, Frequent Pain Assessment and Turn Reposition  Additional Recommendations (Limitations, Scope, Preferences):  Full Scope Treatment  Psycho-social/Spiritual:   Desire for further Chaplaincy support:yes  Additional Recommendations: Caregiving  Support/Resources  Prognosis:  Unable to  determine. Likely less than 3 months given significant deconditioning and recurrent infections.  Discharge Planning: To Be Determined  Patient is certainly hospice eligible should he desire to take advantage of those services.      Primary Diagnoses: Present on Admission:  . Sepsis secondary to UTI ([place]) . Septic shock ([place])  I have reviewed the medical record, interviewed the patient and family, and examined the patient. The following aspects are pertinent.  Past Medical History  Diagnosis Date  . Obesity   . Phlebitis     Lower extermity  . Pulmonary emboli ([place]) 2008    submassive, saddle  . Prostate cancer ([place]) 07/2009  . Sleep apnea     on CPAP  . Hx of echocardiogram 12/04/2010    Normal EF >55% no significant valve disease  . History of stress test 06/27/2009    Low risk and EF of approximately 50%  . DVT (deep venous thrombosis) ([place])   . Chronic kidney disease, stage 3     baseline creatinine ~1.4  . HLD (hyperlipidemia)   . HTN (hypertension)   . Dysrhythmia     A fib  . Diabetes mellitus ([place])     diet controlled  . History of hiatal hernia    Social History   Social History  . Marital Status: Married    Spouse Name: N/A  . Number of Children: N/A  . Years of Education: N/A   Social History Main Topics  . Smoking status: Never Smoker   . Smokeless tobacco: Never Used  . Alcohol Use: 0.0 oz/week    0 Standard drinks or equivalent per week     Comment: occasional beer or wine  . Drug Use: No  . Sexual Activity: Not Asked   Other Topics Concern  . None   Social History Narrative   Family History  Problem Relation Age of Onset  . Cancer Mother   . Diabetes Father   . Cancer Father    Scheduled Meds: . budesonide (PULMICORT) nebulizer solution  0.5 mg Nebulization BID  . darbepoetin (ARANESP) injection - DIALYSIS  100 mcg Intravenous Q Mon-HD  . feeding supplement (NEPRO CARB STEADY)  237 mL Oral BID BM  . feeding supplement (PRO-STAT SUGAR  FREE 64)  30 mL Oral BID  . imipenem-cilastatin  500 mg Intravenous Q8H  . insulin aspart  0-15 Units Subcutaneous Q4H  . ipratropium-albuterol  3 mL Nebulization QID  . midodrine  10 mg Oral TID WC  . multivitamin  1 tablet Oral QHS  . nystatin  5 mL Oral QID  . pantoprazole  40  mg Oral BID AC  . sodium chloride flush  3 mL Intravenous Q12H  . vancomycin  1,000 mg Intravenous Q24H   Continuous Infusions: . DOPamine Stopped (08/08/15 1600)  . dialysis replacement fluid (prismasate) 300 mL/hr at 08/09/15 0818  . dialysis replacement fluid (prismasate) 200 mL/hr at 08/08/15 1412  . dialysate (PRISMASATE) 1,500 mL/hr at 08/09/15 1024   PRN Meds:.fentaNYL (SUBLIMAZE) injection, heparin, HYDROcodone-acetaminophen, ipratropium-albuterol, ondansetron **OR** ondansetron (ZOFRAN) IV, polyvinyl alcohol, sodium chloride  No Known Allergies Review of Systems:  Patient complains of pin point numbness in his right chest and well as pain in his right arm.  He denies other pain, constipation, dysphagia.  Physical Exam  Well developed elderly male, who appears very weak with a whispering voice CV irreg Resp  NAD Abdomen:  Soft, NT Extremities:  3+ pitting edema particularly in LE.  Vital Signs: BP 83/63 mmHg  Pulse 87  Temp(Src) 98.2 F (36.8 C) (Oral)  Resp 19  Ht _0  (1.778 m)  Wt 112.1 kg (247 lb 2.2 oz)  BMI 35.46 kg/m2  SpO2 100% Pain Assessment: No/denies pain   Pain Score: 0-No pain   SpO2: SpO2: 100 % O2 Device:SpO2: 100 % O2 Flow Rate: .O2 Flow Rate (L/min): 2 L/min  IO: Intake/output summary:   Intake/Output Summary (Last 24 hours) at 08/09/15 1203 Last data filed at 08/09/15 1100  Gross per 24 hour  Intake   1026 ml  Output   1994 ml  Net   -968 ml    LBM: Last BM Date: 08/08/15 Baseline Weight: Weight: 112.2 kg (247 lb 5.7 oz) Most recent weight: Weight: 112.1 kg (247 lb 2.2 oz)     Palliative Assessment/Data:   Flowsheet Rows        Most Recent Value    Intake Tab    Referral Department  Critical care   Unit at Time of Referral  ICU   Palliative Care Primary Diagnosis  Other (Comment)   Date Notified  08/08/15   Palliative Care Type  New Palliative care   Reason for referral  Clarify Goals of Care   Date of Admission  08/07/15   Date first seen by Palliative Care  08/08/15   # of days Palliative referral response time  0 Day(s)   # of days IP prior to Palliative referral  1   Clinical Assessment    Palliative Performance Scale Score  30%   Psychosocial & Spiritual Assessment    Palliative Care Outcomes    Patient/Family meeting held?  Yes      Time In: 10:00 Time Out: 11:15 Time Total: 75 min. Greater than 50%  of this time was spent counseling and coordinating care related to the above assessment and plan.  Signed by: Melton Alar, PA-C   Please contact Palliative Medicine Team phone at [phone] for questions and concerns.  For individual provider: See Shea Evans

## 2015-08-09 NOTE — Progress Notes (Signed)
   08/09/15 1300  Clinical Encounter Type  Visited With Family  Visit Type Spiritual support  Referral From Palliative care team  Spiritual Encounters  Spiritual Needs Emotional  Chaplain responded to consult and met briefly with wife of patient, but patient was finally sleeping soundly so did not wake him. Recommend followup by another chaplain. Bodee Lafoe, Chaplain

## 2015-08-09 NOTE — Progress Notes (Signed)
Patient from [facility]. CSW following for disposition and transfer back to SNF if appropriate.          Emiliano Dyer, LCSW [facility] ED/45M Clinical Social Worker [phone]

## 2015-08-10 DIAGNOSIS — Z7189 Other specified counseling: Secondary | ICD-10-CM | POA: Insufficient documentation

## 2015-08-10 DIAGNOSIS — J189 Pneumonia, unspecified organism: Secondary | ICD-10-CM

## 2015-08-10 DIAGNOSIS — J9601 Acute respiratory failure with hypoxia: Secondary | ICD-10-CM

## 2015-08-10 DIAGNOSIS — Z515 Encounter for palliative care: Secondary | ICD-10-CM | POA: Insufficient documentation

## 2015-08-10 DIAGNOSIS — E872 Acidosis: Secondary | ICD-10-CM

## 2015-08-10 LAB — RENAL FUNCTION PANEL
ANION GAP: 7 (ref 5–15)
Albumin: 1.7 g/dL — ABNORMAL LOW (ref 3.5–5.0)
Albumin: 1.6 g/dL — ABNORMAL LOW (ref 3.5–5.0)
Anion gap: 7 (ref 5–15)
BUN: 20 mg/dL (ref 6–20)
BUN: 20 mg/dL (ref 6–20)
CHLORIDE: 103 mmol/L (ref 101–111)
CHLORIDE: 103 mmol/L (ref 101–111)
CO2: 26 mmol/L (ref 22–32)
CO2: 26 mmol/L (ref 22–32)
CREATININE: 1.45 mg/dL — AB (ref 0.61–1.24)
CREATININE: 1.39 mg/dL — AB (ref 0.61–1.24)
Calcium: 8 mg/dL — ABNORMAL LOW (ref 8.9–10.3)
Calcium: 7.9 mg/dL — ABNORMAL LOW (ref 8.9–10.3)
GFR calc Af Amer: 54 mL/min — ABNORMAL LOW (ref >60)
GFR calc non Af Amer: 46 mL/min — ABNORMAL LOW (ref >60)
GFR, EST AFRICAN AMERICAN: 56 mL/min — AB (ref >60)
GFR, EST NON AFRICAN AMERICAN: 49 mL/min — AB (ref >60)
GLUCOSE: 78 mg/dL (ref 65–99)
Glucose, Bld: 166 mg/dL — ABNORMAL HIGH (ref 65–99)
PHOSPHORUS: 2.2 mg/dL — AB (ref 2.5–4.6)
POTASSIUM: 4 mmol/L (ref 3.5–5.1)
POTASSIUM: 4.3 mmol/L (ref 3.5–5.1)
Phosphorus: 1.9 mg/dL — ABNORMAL LOW (ref 2.5–4.6)
Sodium: 136 mmol/L (ref 135–145)
Sodium: 136 mmol/L (ref 135–145)

## 2015-08-10 LAB — GLUCOSE, CAPILLARY
GLUCOSE-CAPILLARY: 84 mg/dL (ref 65–99)
GLUCOSE-CAPILLARY: 121 mg/dL — AB (ref 65–99)
GLUCOSE-CAPILLARY: 168 mg/dL — AB (ref 65–99)
GLUCOSE-CAPILLARY: 109 mg/dL — AB (ref 65–99)

## 2015-08-10 LAB — PROTIME-INR
INR: 4.3 (ref 0.00–1.49)
PROTHROMBIN TIME: 40.1 s — AB (ref 11.6–15.2)

## 2015-08-10 LAB — MAGNESIUM: MAGNESIUM: 2 mg/dL (ref 1.7–2.4)

## 2015-08-10 MED ORDER — POTASSIUM & SODIUM PHOSPHATES 280-160-250 MG PO PACK
1.0000 | PACK | Freq: Three times a day (TID) | ORAL | Status: DC
  Administered 2015-08-10 (×3): 1 via ORAL
  Filled 2015-08-10 (×5): qty 1

## 2015-08-10 MED ORDER — CETYLPYRIDINIUM CHLORIDE 0.05 % MT LIQD
7.0000 mL | Freq: Two times a day (BID) | OROMUCOSAL | Status: DC
  Administered 2015-08-10 – 2015-08-23 (×26): 7 mL via OROMUCOSAL

## 2015-08-10 MED ORDER — SIMETHICONE 80 MG PO CHEW
80.0000 mg | CHEWABLE_TABLET | Freq: Four times a day (QID) | ORAL | Status: DC | PRN
  Administered 2015-08-10: 80 mg via ORAL
  Filled 2015-08-10: qty 1

## 2015-08-10 NOTE — Progress Notes (Signed)
PULMONARY / CRITICAL CARE MEDICINE   Name: [name] MRN: [id number] DOB: 1940/07/05    ADMISSION DATE:  08/07/2015 CONSULTATION DATE:  08/08/15  REFERRING MD:  Olevia Bowens [facility])  CHIEF COMPLAINT:  Weakness  HISTORY OF PRESENT ILLNESS:  Pt is encephelopathic; therefore, this HPI is obtained from chart review. [name] is a 75 y.o. male with PMH as outlined below. He had recent prolonged extensive admission 05/31/15 through 07/31/15 for multiple problems including but not limited to septic shock due to Klebsiella bacteremia  Pyelonephritis/UTI complicated by hemorrhagic shock due to coagulopathy with subsequent cardiac arrest, complicated by left pneumothorax following CPR s/p chest tube placement.  He was eventually discharged to SNF on 07/31/15; however, he returned to [facility] ED 08/07/15 with fatigue, somnolence, fever.  He was found to have UTI and was initially being admitted to SDU as he had responded well to 18ml/kg bolus; however, he later developed hypotension along with significant third spacing.  He was started on albumin and due to persistent hypotension, PCCM was asked to admit to ICU for septic shock.  Pt's wife states that on day of presentation, pt also had a cough that was productive of foul smelling brown sputum.  He also complained of chest tightness while coughing.  Besides fever on day of presentation, no hx of fevers/chills/sweats, N/V/D, abd pain, myalgias.  No exposures to known sick contacts.  Of note, he has hx of ESBL Klebsiella UTI as well as Klebsiella bacteremia.  SUBJECTIVE:   Remains on cvvhd Neg balance 2 liters  VITAL SIGNS: BP 133/42 mmHg  Pulse 90  Temp(Src) 97.3 F (36.3 C) (Oral)  Resp 18  Ht 5\' 10"  (1.778 m)  Wt 109.3 kg (240 lb 15.4 oz)  BMI 34.57 kg/m2  SpO2 99%  HEMODYNAMICS:    VENTILATOR SETTINGS: Vent Mode:  [-]  FiO2 (%):  [28 %] 28 %  INTAKE / OUTPUT: I/O last 3 completed shifts: In: [id number] [P.O.:845; I.V.:16; IV  Piggyback:700] Out: [id number] [Urine:80; Other:4181]   PHYSICAL EXAMINATION: General: Chronically ill appearing male, in NAD. Neuro: Awake, oriented x 4 HEENT: [place]/AT. PERRL Cardiovascular: IRIR, no M Lungs: coarse bases Abdomen: Obese.  Abd soft, NT/ND.  Musculoskeletal: No gross deformities, 3+ edema to bilateral LE's up to thighs, 1+ edema to bilateral UE's about same Skin: Intact, warm, no rashes.  LABS:  BMET  Recent Labs Lab 08/09/15 0415 08/09/15 1717 08/10/15 0615  NA 137 135 136  K 3.9 4.1 4.0  CL 105 102 103  CO2 25 24 26   BUN 38* 28* 20  CREATININE 2.32* 1.80* 1.45*  GLUCOSE 108* 164* 78    Electrolytes  Recent Labs Lab 08/09/15 0415 08/09/15 1717 08/10/15 0615  CALCIUM 7.8* 7.9* 8.0*  MG 1.9  --  2.0  PHOS 3.4 2.2* 2.2*    CBC  Recent Labs Lab 08/07/15 1957  08/07/15 2255 08/08/15 1933 08/09/15 0415  WBC 8.3  --  6.7  --  4.8  HGB 9.9*  < > 8.8* 8.5* 8.2*  HCT 32.3*  < > 28.6* 25.0* 26.8*  PLT 323  --  297  --  300  < > = values in this interval not displayed.  Coag's  Recent Labs Lab 08/08/15 0704 08/09/15 0415 08/10/15 0615  INR 5.44* 5.75* 4.30*    Sepsis Markers  Recent Labs Lab 08/07/15 2007 08/07/15 2308  LATICACIDVEN 2.01* 1.74    ABG  Recent Labs Lab 08/07/15 2026  PHART 7.519*  PCO2ART 30.2*  PO2ART  62.0*    Liver Enzymes  Recent Labs Lab 08/07/15 1957 08/09/15 0415 08/09/15 1717 08/10/15 0615  AST 29  --   --   --   ALT 20  --   --   --   ALKPHOS 115  --   --   --   BILITOT 0.8  --   --   --   ALBUMIN 1.5* 1.7* 1.8* 1.7*    Cardiac Enzymes  Recent Labs Lab 08/07/15 1957 08/07/15 2255  TROPONINI 0.05* <0.03    Glucose  Recent Labs Lab 08/09/15 0417 08/09/15 0718 08/09/15 1132 08/09/15 1539 08/09/15 2058 08/10/15 0808  GLUCAP 113* 105* 140* 148* 101* 84    Imaging No results found.   STUDIES:  CXR 07/02 > cardiomegaly with mild congestion.  CULTURES: Blood 07/02 > Kleb  pna, Coag (-) staph in 1st bottle; (-) in 2nd bottle Urine 07/02 > several species Sputum 07/02 > no specimen BC 7/4>>>  ANTIBIOTICS: Vanc 07/02 >>> Cefepime 07/02 >>> Anidulo 7/3>>>off  SIGNIFICANT EVENTS: 07/02 > admitted with septic shock due to UTI.  LINES/TUBES: None.  DISCUSSION: 75 y.o. M with recent prolonged hospital admission due to multiple issues including but not limited to septic shock due to Klebsiella bacteremia pyelonephritis/UTI complicated by hemorrhagic shock due to coagulopathy with subsequent cardiac arrest, complicated by left pneumothorax following CPR s/p chest tube placement. Pt also had DVT during prolonged hospitalization for which he was discharged on coumadin.  He is now being admitted with septic shock due to UTI.  ASSESSMENT / PLAN:  CARDIOVASCULAR A:  Septic shock - due to UTI, r/o line Hx A.fib (on warfarin) DVT, HLD, HTN. P:  MAp goal 55, has low baseline MAP Can pull off volume cvvhd aggressive Line , see ID If needed add neo, as now fib in 80's  INFECTIOUS A:   Septic shock - due to UTI, LLL R/o containation staph coag neg R/o line Hd cath on admission  P:   Cont imipenem x 14 days In am if [facility] neg, repeated 4th, wil dc vanc dollow closley repeat BC Line to stay for now Urine with a lot of species, unable to tell  PULMONARY A: Acute on chronic hypoxic respiratory failure. Concern for HCAP. CXR with possible LLL infiltrate  Hx OSA - on CPAP. Hx PE (2008). P:   Continue nocturnal CPAP. pulmicort BID and duoneb qid.  RENAL A:   ESRD (M/W/F). Hyponatremia. Hypocalcemia. P:   Cont CVVH, neg balance goals remain  GASTROINTESTINAL A:   Fecal occult positive. Nutrition. P:   Cont diet diet Advance diet  HEMATOLOGIC A:   Warfarin induced coagulopathy. Anemia - chronic. VTE Prophylaxis. Hx hemorrhagic shock (duet o right perinephric hematoma s/p embolization) with subsequent cardiac arrest. P:  Observing for now.  No signs of bleeding Hold warfarin, following inr in am  Transfuse for Hgb < 7. SCD's only.  ENDOCRINE A:   DM.   No rel AI P:   SSI.  NEUROLOGIC A:   Acute encephalopathy likely related to sepsis. Clinically improved.  P:   Monitor clinically. Avoid sedating meds.  Family updated: Wife updated 7/5  Interdisciplinary Family Meeting v Palliative Care Meeting:  Due by: 08/14/15.  Ccm tim 55min   Lavon Paganini. Titus Mould, MD, [place] Pgr: [facility]

## 2015-08-10 NOTE — Progress Notes (Signed)
Nutrition Follow-up  DOCUMENTATION CODES:   Obesity unspecified  INTERVENTION:   Continue:   Nepro Shake po BID, each supplement provides 425 kcal and 19 grams protein   Prostat liquid protein po 30 ml BID with meals, each supplement provides 100 kcal, 15 grams protein  NUTRITION DIAGNOSIS:   Increased nutrient needs related to chronic illness, wound healing as evidenced by estimated needs. Ongoing.   GOAL:   Patient will meet greater than or equal to 90% of their needs Progressing.   MONITOR:   PO intake, Supplement acceptance, Labs, Weight trends, Skin, I & O's  REASON FOR ASSESSMENT:   Consult Assessment of nutrition requirement/status (Albumin 1.7, please talk to family about nutrition)  ASSESSMENT:   [name] new ESRD MWF ([place] [facility] 3 op hd txs last on 08/05/15 uneventful except left below edw 109.5 At 106.8) Ho prostate CA s/p radiation 2011 and RCC s/p left nephrectomy and most recently extensive admit 05/31/15 > 07/31/15 for multiple problems including but not limited to Klebsiella bacteremia with septic shock with Pyelonephritis/UTI complicated by hemorrhagic shock due to coagulopathy with subsequent cardiac arrest, complicated by left pneumothorax following CPR s/p chest tube placement.   Albumin has a half-life of 21 days and is strongly affected by stress response and inflammatory process, therefore, do not expect to see an improvement in this lab value during acute hospitalization.  Spoke with pt and wife about importance of nutrition. Pt has been trying to consume his meals (around 25% of his full liquid diet). Would like to have pancakes at breakfast. Wife reports that pt had difficulty with his meats at the nursing home and feels he would do better with soft/chopped meats. Discussed with RN who will address with MD during rounding.  Pt does like his supplements and continues to take them.   Remains on CVVHD Medications reviewed and include:  rena-vit Labs reviewed: PO4 2.2 CBG's: 101-148 4+ pitting edema  Diet Order:  Diet full liquid Room service appropriate?: Yes; Fluid consistency:: Thin  Skin:  Wound (see comment) (MASD groin/sacrum/buttocks, skin tear/weeping R arm)   Last BM:  7/4  Height:   Ht Readings from Last 1 Encounters:  08/08/15 5\' 10"  (1.778 m)    Weight:   Wt Readings from Last 1 Encounters:  08/10/15 240 lb 15.4 oz (109.3 kg)    Ideal Body Weight:  75 kg  BMI:  Body mass index is 34.57 kg/(m^2).  Estimated Nutritional Needs:   Kcal:  1950-2150  Protein:  110-130 gm  Fluid:  1.2 L  EDUCATION NEEDS:   No education needs identified at this time  [place], [place], [place] Pager [phone] After Hours Pager

## 2015-08-10 NOTE — Progress Notes (Signed)
[place] for Heparin when INR </= 2 Indication: h/o DVT/PE and afib  No Known Allergies  Patient Measurements: Height: 5\' 10"  (177.8 cm) Weight: 240 lb 15.4 oz (109.3 kg) IBW/kg (Calculated) : 73  Ht: 69.5 in  Wt: 109.6kg  IBW: 72 kg Heparin Dosing Weight: 96 kg  Vital Signs: Temp: 97.6 F (36.4 C) (07/05 1200) Temp Source: Oral (07/05 1200) BP: 97/64 mmHg (07/05 1200) Pulse Rate: 88 (07/05 1200)  Labs:  Recent Labs  08/07/15 1957  08/07/15 2255 08/08/15 0704 08/08/15 1933  08/09/15 0415 08/09/15 1717 08/10/15 0615  HGB 9.9*  < > 8.8*  --  8.5*  --  8.2*  --   --   HCT 32.3*  < > 28.6*  --  25.0*  --  26.8*  --   --   PLT 323  --  297  --   --   --  300  --   --   LABPROT  --   < >  --  47.9*  --   --  49.9*  --  40.1*  INR  --   < >  --  5.44*  --   --  5.75*  --  4.30*  CREATININE 3.76*  < >  --  3.53* 2.90*  < > 2.32* 1.80* 1.45*  TROPONINI 0.05*  --  <0.03  --   --   --   --   --   --   < > = values in this interval not displayed.  Estimated Creatinine Clearance: 56.2 mL/min (by C-G formula based on Cr of 1.45).   Medical History: Past Medical History  Diagnosis Date  . Obesity   . Phlebitis     Lower extermity  . Pulmonary emboli ([place]) 2008    submassive, saddle  . Prostate cancer ([place]) 07/2009  . Sleep apnea     on CPAP  . Hx of echocardiogram 12/04/2010    Normal EF >55% no significant valve disease  . History of stress test 06/27/2009    Low risk and EF of approximately 50%  . DVT (deep venous thrombosis) ([place])   . Chronic kidney disease, stage 3     baseline creatinine ~1.4  . HLD (hyperlipidemia)   . HTN (hypertension)   . Dysrhythmia     A fib  . Diabetes mellitus ([place])     diet controlled  . History of hiatal hernia     Assessment: 75 y.o. M presents with. Pt on coumadin PTA at SNF for h/o DVT and afib - per SNF MAR, coumadin currently on hold since 6/28 due to elevated INR.   INR elevated upon  admission to hospital (4.75), now down in 4s today, continue to hold anticoagulation, last dose of Coumadin PTA was 6/28. Currently holding coumadin and MD order to start heparin when INR < 2.  Goal of Therapy:  INR 2-3 Heparin level 0.3-0.7 units/ml Monitor platelets by anticoagulation protocol: Yes   Plan:  Daily INR Start heparin when INR </=2  Erin Hearing PharmD., BCPS Clinical Pharmacist Pager [phone] 08/10/2015 12:23 PM

## 2015-08-10 NOTE — Progress Notes (Signed)
[place] Progress Note Patient Name: DOCK SHAMBURG DOB: 1940/04/12 MRN: [id number]   Date of Service  08/10/2015  HPI/Events of Note  Patient c/o excess gas and requests Simethicone.  eICU Interventions  Will order Simethicone 80 mg PO Q 6 hours PRN.      Intervention Category Intermediate Interventions: Other:  Lysle Dingwall 08/10/2015, 8:51 PM

## 2015-08-10 NOTE — Progress Notes (Signed)
Daily Progress Note   Villegas Name: [name]       Date: 08/10/2015 DOB: [birth year]  Age: 75 y.o. MRN#: [id number] Attending Physician: Rush Landmark, MD Primary Care Physician: Melinda Crutch Admit Date: 08/07/2015  Reason for Consultation/Follow-up: Establishing goals of care  Subjective: [name] is feeling well this morning.  He tells me what he remembers of Raymond two cardiac arrests.  Raymond first one was simply a flash, but he clearly remembers making a decision about Raymond faith and making a choice to return during Raymond second arrest.  He doesn't remember pain from either of them.  He mentions that he and [name] talked yesterday after I left, about HCPOA and advanced directives.  [name] intends to complete them today and we will ask Raymond chaplain service to notarize.  I talked with [name] and [name] about specific evidence of improvement - to include being able to come off of CRRT in Raymond next few days, and being able to leave Raymond ICU.  [name] asked about Raymond different levels of care post hospitalization - I highlighted LTAC, SNF, CIR, and let them know that Raymond medical progress and needs at Raymond time of discharge will determine which type of facility is most appropriate.   Length of Stay: 3  Current Medications: Scheduled Meds:  . antiseptic oral rinse  7 mL Mouth Rinse BID  . budesonide (PULMICORT) nebulizer solution  0.5 mg Nebulization BID  . darbepoetin (ARANESP) injection - DIALYSIS  100 mcg Intravenous Q Mon-HD  . feeding supplement (NEPRO CARB STEADY)  237 mL Oral BID BM  . feeding supplement (PRO-STAT SUGAR FREE 64)  30 mL Oral BID  . imipenem-cilastatin  500 mg Intravenous Q8H  . insulin aspart  0-15 Units Subcutaneous TID WC  . ipratropium-albuterol  3 mL  Nebulization QID  . midodrine  10 mg Oral TID WC  . multivitamin  1 tablet Oral QHS  . nystatin  5 mL Oral QID  . pantoprazole  40 mg Oral BID AC  . potassium & sodium phosphates  1 packet Oral TID WC & HS  . sodium chloride flush  3 mL Intravenous Q12H  . vancomycin  1,000 mg Intravenous Q24H    Continuous Infusions: . DOPamine Stopped (08/08/15 1600)  . dialysis replacement  fluid (prismasate) 300 mL/hr at 08/10/15 0135  . dialysis replacement fluid (prismasate) 200 mL/hr at 08/10/15 0304  . dialysate (PRISMASATE) 1,500 mL/hr at 08/10/15 0630    PRN Meds: fentaNYL (SUBLIMAZE) injection, heparin, HYDROcodone-acetaminophen, ipratropium-albuterol, ondansetron **OR** ondansetron (ZOFRAN) IV, polyvinyl alcohol, sodium chloride  Physical Exam          Vital Signs: BP 133/42 mmHg  Pulse 90  Temp(Src) 97.3 F (36.3 C) (Oral)  Resp 18  Ht 5\' 10"  (1.778 m)  Wt 109.3 kg (240 lb 15.4 oz)  BMI 34.57 kg/m2  SpO2 99% SpO2: SpO2: 99 % O2 Device: O2 Device: Nasal Cannula O2 Flow Rate: O2 Flow Rate (L/min): 2 L/min   Well developed, very pleasant male, alert, orientated  CV RRR Resp NAD Ext RUE with tremor, 3+ edema in LE bilaterally   Intake/output summary:  Intake/Output Summary (Last 24 hours) at 08/10/15 0948 Last data filed at 08/10/15 0900  Gross per 24 hour  Intake   1191 ml  Output   3296 ml  Net  -2105 ml   LBM: Last BM Date: 08/09/15 Baseline Weight: Weight: 112.2 kg (247 lb 5.7 oz) Most recent weight: Weight: 109.3 kg (240 lb 15.4 oz)       Palliative Assessment/Data:    Flowsheet Rows        Most Recent Value   Intake Tab    Referral Department  Critical care   Unit at Time of Referral  ICU   Palliative Care Primary Diagnosis  Other (Comment)   Date Notified  08/08/15   Palliative Care Type  New Palliative care   Reason for referral  Clarify Goals of Care   Date of Admission  08/07/15   Date first seen by Palliative Care  08/08/15   # of days Palliative  referral response time  0 Day(s)   # of days IP prior to Palliative referral  1   Clinical Assessment    Palliative Performance Scale Score  30%   Psychosocial & Spiritual Assessment    Palliative Care Outcomes    Villegas/Villegas meeting held?  Yes      Villegas Active Problem List   Diagnosis Date Noted  . Severe sepsis ([place])   . CKD (chronic kidney disease)   . Pressure ulcer 08/08/2015  . Acute respiratory failure with hypoxia ([place])   . UTI (lower urinary tract infection)   . ESRD (end stage renal disease) ([place])   . Hypocalcemia   . Acute encephalopathy   . Chronic anemia   . Fecal occult blood test positive   . HCAP (healthcare-associated pneumonia)   . Sepsis ([place])   . Elevated lactic acid level   . Elevated troponin   . Sepsis secondary to UTI ([place]) 08/07/2015  . Hypoalbuminemia 07/27/2015  . Pneumothorax   . Traumatic pneumothorax   . OSA on CPAP   . Acute on chronic renal failure ([place])   . Hemorrhagic shock   . S/p nephrectomy   . Sepsis due to Klebsiella ([place])   . OSA (obstructive sleep apnea)   . Chronic diastolic CHF (congestive heart failure) ([place])   . Cardiac arrest ([place])   . Acute deep vein thrombosis (DVT) of distal vein of right lower extremity ([place])   . Acute renal failure (ARF) ([place])   . Acute hypoxemic respiratory failure ([place])   . Acute back pain   . Bleeding   . Chest tube in place   . Perinephric hematoma   . Right flank pain   .  Acute respiratory failure ([place])   . Bacteremia due to Klebsiella pneumoniae   . Secondary cardiomyopathy ([place])   . Chronic atrial fibrillation ([place])   . Uncontrolled type 2 diabetes mellitus with complication ([place])   . Dyspnea   . Acute renal failure ([place]) 06/02/2015  . Encounter for central line placement   . Septic shock ([place]) 05/31/2015  . Renal failure   . Urinary tract infectious disease   . Arterial hypotension   . Increased anion gap metabolic acidosis   . Hyponatremia   . Rotator cuff tear 05/03/2015  .  Atrial fibrillation ([place]) 11/10/2014  . Obesity (BMI 30-39.9) 11/10/2014  . Type II or unspecified type diabetes mellitus with unspecified complication, uncontrolled 04/06/2013  . Anemia 04/04/2013  . 1St degree AV block 03/07/2013  . Acute blood loss anemia 03/06/2013  . Acute-on-chronic kidney injury, baseline CKD stage 3 03/06/2013  . Hypotension, unspecified 03/06/2013  . Bradycardia 03/06/2013  . Perineal pain 03/06/2013  . Preoperative clearance 03/03/2013  . Gross hematuria 03/02/2013  . Severe obstructive sleep apnea 12/04/2012  . Morbid obesity ([place]) 12/04/2012  . Bilateral lower extremity edema 12/04/2012  . Venous insufficiency 12/04/2012  . History of nephrectomy, unilateral 12/04/2012  . Chronic anticoagulation 12/04/2012  . History of pulmonary embolism 12/04/2012  . Essential hypertension 12/04/2012  . Cardiac murmur 12/04/2012    Palliative Care Assessment & Plan   Villegas Profile: 75 y.o. male with past medical history of prostate CA s/p radiation 2011 and RCC s/p left nephrectomy,chronic foley cath, sleep apnea on CPAP, prior DVT and saddle PE, CKD stage III, HTN, DM. Recent prolonged admission 05/31/2015-07/31/2014 with AKI, Klebsiella bacteremia, peritoneal right perinephric hematoma, cardiac arrest x 2, post CPR left pneumothorax requiring chest tube placement, intubated x 2, and also ESRD initiated hemodialysis. Admitted on 08/07/2015 with septic shock d/t UTI vs HCAP.  Recommendations/Plan: PMT will continue to work with Villegas and Villegas regarding advanced directives and goals of care.  Goals of Care and Additional Recommendations:  Limitations on Scope of Treatment: Full Comfort Care  Code Status:  Full  Prognosis:   Unable to determine  Likely less than 3 months given significant deconditioning and recurrent infections.  Discharge Planning:  To Be Determined pending Villegas's medical progress  Care plan was discussed with Villegas, Villegas, and bedside  RN  Thank you for allowing Raymond Palliative Medicine Team to assist in Raymond care of this Villegas.   Time In: 8:30 Time Out: 8:45 Total Time 15 Prolonged Time Billed no      Greater than 50%  of this time was spent counseling and coordinating care related to Raymond above assessment and plan.  Imogene Burn, PA-C Palliative Medicine Pager: [phone]  Please contact Palliative Medicine Team phone at [phone] for questions and concerns.

## 2015-08-10 NOTE — Progress Notes (Signed)
Lab called critical lab INR 4.30. Lower than previous reported value and expected

## 2015-08-10 NOTE — Progress Notes (Signed)
CVVHD management.  BP better though being taken in leg.  Says he is eating.  PO4 low, will replace orally.  Will try to pull 150cc/hr.  Once volume controlled then will switch to IHD. 1/2 BC pos for klebsiella and coag neg staph. He remains on AB.

## 2015-08-11 ENCOUNTER — Inpatient Hospital Stay (HOSPITAL_COMMUNITY): Payer: BLUE CROSS/BLUE SHIELD

## 2015-08-11 ENCOUNTER — Telehealth: Payer: Self-pay | Admitting: Pulmonary Disease

## 2015-08-11 DIAGNOSIS — Z7189 Other specified counseling: Secondary | ICD-10-CM | POA: Insufficient documentation

## 2015-08-11 LAB — RENAL FUNCTION PANEL
ALBUMIN: 1.7 g/dL — AB (ref 3.5–5.0)
ANION GAP: 5 (ref 5–15)
Albumin: 1.7 g/dL — ABNORMAL LOW (ref 3.5–5.0)
Anion gap: 6 (ref 5–15)
BUN: 17 mg/dL (ref 6–20)
BUN: 15 mg/dL (ref 6–20)
CALCIUM: 8 mg/dL — AB (ref 8.9–10.3)
CHLORIDE: 102 mmol/L (ref 101–111)
CO2: 27 mmol/L (ref 22–32)
CO2: 26 mmol/L (ref 22–32)
CREATININE: 1.23 mg/dL (ref 0.61–1.24)
CREATININE: 1.29 mg/dL — AB (ref 0.61–1.24)
Calcium: 8 mg/dL — ABNORMAL LOW (ref 8.9–10.3)
Chloride: 105 mmol/L (ref 101–111)
GFR calc Af Amer: >60 mL/min (ref >60)
GFR calc Af Amer: >60 mL/min (ref >60)
GFR calc non Af Amer: 56 mL/min — ABNORMAL LOW (ref >60)
GFR, EST NON AFRICAN AMERICAN: 53 mL/min — AB (ref >60)
GLUCOSE: 95 mg/dL (ref 65–99)
GLUCOSE: 134 mg/dL — AB (ref 65–99)
PHOSPHORUS: 2.5 mg/dL (ref 2.5–4.6)
Phosphorus: 2 mg/dL — ABNORMAL LOW (ref 2.5–4.6)
Potassium: 4.3 mmol/L (ref 3.5–5.1)
Potassium: 4.2 mmol/L (ref 3.5–5.1)
SODIUM: 137 mmol/L (ref 135–145)
Sodium: 134 mmol/L — ABNORMAL LOW (ref 135–145)

## 2015-08-11 LAB — GLUCOSE, CAPILLARY
GLUCOSE-CAPILLARY: 122 mg/dL — AB (ref 65–99)
GLUCOSE-CAPILLARY: 136 mg/dL — AB (ref 65–99)
Glucose-Capillary: 101 mg/dL — ABNORMAL HIGH (ref 65–99)
Glucose-Capillary: 148 mg/dL — ABNORMAL HIGH (ref 65–99)

## 2015-08-11 LAB — CULTURE, BLOOD (ROUTINE X 2)

## 2015-08-11 LAB — CBC
HCT: 30.8 % — ABNORMAL LOW (ref 39.0–52.0)
HEMOGLOBIN: 9.1 g/dL — AB (ref 13.0–17.0)
MCH: 27.2 pg (ref 26.0–34.0)
MCHC: 29.5 g/dL — AB (ref 30.0–36.0)
MCV: 92.2 fL (ref 78.0–100.0)
PLATELETS: 294 10*3/uL (ref 150–400)
RBC: 3.34 MIL/uL — ABNORMAL LOW (ref 4.22–5.81)
RDW: 17.4 % — ABNORMAL HIGH (ref 11.5–15.5)
WBC: 5.2 10*3/uL (ref 4.0–10.5)

## 2015-08-11 LAB — MAGNESIUM: MAGNESIUM: 2.3 mg/dL (ref 1.7–2.4)

## 2015-08-11 LAB — PROTIME-INR
INR: 3.18 — AB (ref 0.00–1.49)
PROTHROMBIN TIME: 32 s — AB (ref 11.6–15.2)

## 2015-08-11 MED ORDER — POTASSIUM & SODIUM PHOSPHATES 280-160-250 MG PO PACK
1.0000 | PACK | Freq: Two times a day (BID) | ORAL | Status: DC
  Administered 2015-08-11 – 2015-08-12 (×3): 1 via ORAL
  Filled 2015-08-11 (×4): qty 1

## 2015-08-11 MED ORDER — FAMOTIDINE IN NACL 20-0.9 MG/50ML-% IV SOLN
20.0000 mg | Freq: Two times a day (BID) | INTRAVENOUS | Status: DC
  Administered 2015-08-11 – 2015-08-12 (×3): 20 mg via INTRAVENOUS
  Filled 2015-08-11 (×4): qty 50

## 2015-08-11 MED ORDER — METOCLOPRAMIDE HCL 5 MG/ML IJ SOLN
5.0000 mg | Freq: Once | INTRAMUSCULAR | Status: AC
  Administered 2015-08-11: 5 mg via INTRAVENOUS
  Filled 2015-08-11: qty 2

## 2015-08-11 MED ORDER — WARFARIN SODIUM 2.5 MG PO TABS
2.5000 mg | ORAL_TABLET | Freq: Once | ORAL | Status: AC
  Administered 2015-08-11: 2.5 mg via ORAL
  Filled 2015-08-11: qty 1

## 2015-08-11 MED ORDER — ONDANSETRON HCL 40 MG/20ML IJ SOLN
8.0000 mg | Freq: Four times a day (QID) | INTRAMUSCULAR | Status: DC | PRN
  Administered 2015-08-11 – 2015-08-12 (×2): 8 mg via INTRAVENOUS
  Filled 2015-08-11 (×5): qty 4

## 2015-08-11 MED ORDER — WARFARIN - PHARMACIST DOSING INPATIENT
Freq: Every day | Status: DC
  Administered 2015-08-18 – 2015-08-23 (×3)

## 2015-08-11 NOTE — Progress Notes (Addendum)
PULMONARY / CRITICAL CARE MEDICINE   Name: [name] MRN: [id number] DOB: 08/04/1940    ADMISSION DATE:  08/07/2015 CONSULTATION DATE:  08/08/15  REFERRING MD:  Olevia Bowens [facility])  CHIEF COMPLAINT:  Weakness  HISTORY OF PRESENT ILLNESS:  Pt is encephelopathic; therefore, this HPI is obtained from chart review. [name] is a 75 y.o. male with PMH as outlined below. He had recent prolonged extensive admission 05/31/15 through 07/31/15 for multiple problems including but not limited to septic shock due to Klebsiella bacteremia  Pyelonephritis/UTI complicated by hemorrhagic shock due to coagulopathy with subsequent cardiac arrest, complicated by left pneumothorax following CPR s/p chest tube placement.  He was eventually discharged to SNF on 07/31/15; however, he returned [facility] ED 08/07/15 with fatigue, somnolence, fever.  He was found to have UTI and was initially being admitted to SDU as he had responded well to 41ml/kg bolus; however, he later developed hypotension along with significant third spacing.  He was started on albumin and due to persistent hypotension, PCCM was asked to admit to ICU for septic shock.  Pt's wife states that on day of presentation, pt also had a cough that was productive of foul smelling brown sputum.  He also complained of chest tightness while coughing.  Besides fever on day of presentation, no hx of fevers/chills/sweats, N/V/D, abd pain, myalgias.  No exposures to known sick contacts.  Of note, he has hx of ESBL Klebsiella UTI as well as Klebsiella bacteremia.  SUBJECTIVE:   Remains on cvvhd Neg balance Remains full code Lower edema  VITAL SIGNS: BP 101/70 mmHg  Pulse 104  Temp(Src) 96.1 F (35.6 C) (Oral)  Resp 22  Ht 5\' 10"  (1.778 m)  Wt 108 kg (238 lb 1.6 oz)  BMI 34.16 kg/m2  SpO2 99%  HEMODYNAMICS:    VENTILATOR SETTINGS: Vent Mode:  [-]  FiO2 (%):  [28 %] 28 %  INTAKE / OUTPUT: I/O last 3 completed shifts: In: 2000 [P.O.:1260; I.V.:40;  IV Piggyback:700] Out: 6086 [Urine:101; Other:5985]   PHYSICAL EXAMINATION: General: Chronically ill appearing male, in NAD. Neuro: Awake, oriented x 4 HEENT: [place]/AT. PERRL Cardiovascular: IRIR, no M Lungs: reduced Abdomen: Obese.  Abd soft, NT/ND.  Musculoskeletal: No gross deformities, 2+ edema to bilateral LE's up to thighs, 1+ edema to bilateral UE's about same Skin: Intact, warm, no rashes.  LABS:  BMET  Recent Labs Lab 08/10/15 0615 08/10/15 1300 08/11/15 0530  NA 136 136 137  K 4.0 4.3 4.3  CL 103 103 105  CO2 26 26 27   BUN 20 20 17   CREATININE 1.45* 1.39* 1.23  GLUCOSE 78 166* 95    Electrolytes  Recent Labs Lab 08/09/15 0415  08/10/15 0615 08/10/15 1300 08/11/15 0530  CALCIUM 7.8*  < > 8.0* 7.9* 8.0*  MG 1.9  --  2.0  --  2.3  PHOS 3.4  < > 2.2* 1.9* 2.5  < > = values in this interval not displayed.  CBC  Recent Labs Lab 08/07/15 2255 08/08/15 1933 08/09/15 0415 08/11/15 0530  WBC 6.7  --  4.8 5.2  HGB 8.8* 8.5* 8.2* 9.1*  HCT 28.6* 25.0* 26.8* 30.8*  PLT 297  --  300 294    Coag's  Recent Labs Lab 08/09/15 0415 08/10/15 0615 08/11/15 0530  INR 5.75* 4.30* 3.18*    Sepsis Markers  Recent Labs Lab 08/07/15 2007 08/07/15 2308  LATICACIDVEN 2.01* 1.74    ABG  Recent Labs Lab 08/07/15 2026  PHART 7.519*  PCO2ART 30.2*  PO2ART 62.0*    Liver Enzymes  Recent Labs Lab 08/07/15 1957  08/10/15 0615 08/10/15 1300 08/11/15 0530  AST 29  --   --   --   --   ALT 20  --   --   --   --   ALKPHOS 115  --   --   --   --   BILITOT 0.8  --   --   --   --   ALBUMIN 1.5*  < > 1.7* 1.6* 1.7*  < > = values in this interval not displayed.  Cardiac Enzymes  Recent Labs Lab 08/07/15 1957 08/07/15 2255  TROPONINI 0.05* <0.03    Glucose  Recent Labs Lab 08/09/15 2058 08/10/15 0808 08/10/15 1207 08/10/15 1538 08/10/15 2257 08/11/15 0759  GLUCAP 101* 84 121* 168* 109* 101*    Imaging No results  found.   STUDIES:  CXR 07/02 > cardiomegaly with mild congestion.  CULTURES: Blood 07/02 > Kleb pna, Coag (-) staph in 1st bottle; (-) in 2nd bottle Urine 07/02 > several species Sputum 07/02 > no specimen BC 7/4>>>  ANTIBIOTICS: Vanc 07/02 >>>7/6 Cefepime 07/02 >>> Anidulo 7/3>>>off  SIGNIFICANT EVENTS: 07/02 > admitted with septic shock due to UTI.  LINES/TUBES: None.  DISCUSSION: 75 y.o. M with recent prolonged hospital admission due to multiple issues including but not limited to septic shock due to Klebsiella bacteremia pyelonephritis/UTI complicated by hemorrhagic shock due to coagulopathy with subsequent cardiac arrest, complicated by left pneumothorax following CPR s/p chest tube placement. Pt also had DVT during prolonged hospitalization for which he was discharged on coumadin.  He is now being admitted with septic shock due to UTI.  ASSESSMENT / PLAN:  CARDIOVASCULAR A:  Septic shock - due to UTI, r/o line Hx A.fib (on warfarin) DVT, HLD, HTN. P:  MAp goal 55, has low baseline MAP Can pull off volume cvvhd aggressive - successful to neg 2.6 liters If needed add neo  INFECTIOUS A:   Septic shock - UTi source likely with pt history and organism R/o containation staph coag neg - highly likely R/o line Hd cath on admission - unlikely  P:   imipenem x 14 days Pt reports concerning history for urine as source as well as organism noted At this stage I hate ot give him 2 organisms as pathogens when abdo not involved and staph unlikely from urine Follow up [facility] also neg coag neg staph Dc vanc  PULMONARY A: Acute on chronic hypoxic respiratory failure. Concern for HCAP. CXR with possible LLL infiltrate  Hx OSA - on CPAP. Hx PE (2008). P:   Continue nocturnal CPAP. pulmicort BID and duoneb qid.  RENAL A:   ESRD (M/W/F). Hyponatremia. Hypocalcemia. P:   Cont CVVH, neg balance goals remain, likley to int hd in am  He may NOT be able to tolerate this, need  to discuss code status further  GASTROINTESTINAL A:   Fecal occult positive. Nutrition. N/V P:   Diet KUB ppi to bid to iv zofran  HEMATOLOGIC A:   Warfarin induced coagulopathy. Anemia - chronic. VTE Prophylaxis. Hx hemorrhagic shock (duet o right perinephric hematoma s/p embolization) with subsequent cardiac arrest. P:  Observing for now. No signs of bleeding Consider restart coumadin  Once inr less 2.5 Transfuse for Hgb < 7. SCD's only.  ENDOCRINE A:   DM.   No rel AI P:   SSI.  NEUROLOGIC A:   Acute encephalopathy likely related to sepsis. Clinically improved.  P:   Monitor clinically. Avoid sedating meds. I will d/w code status and plans if unable to tolerate int HD today with wife and pt  Family updated: Wife updated 7/5  Interdisciplinary Family Meeting v Palliative Care Meeting:  Due by: 08/14/15.  Ccm tim 73min   Lavon Paganini. Titus Mould, MD, [place] Pgr: [facility]          I have had extensive discussions with family and pt in full alert. We discussed patients current circumstances and organ failures. We also discussed patient's prior wishes under circumstances such as this. Family has decided to NOT perform resuscitation if arrest but to continue current medical support for now. Pressors short term only okay No vent, acls  Lavon Paganini. Titus Mould, MD, [facility] Pgr: [place] [facility]

## 2015-08-11 NOTE — Progress Notes (Signed)
[place] for warfarin Indication: h/o DVT/PE and afib  No Known Allergies  Patient Measurements: Height: 5\' 10"  (177.8 cm) Weight: 238 lb 1.6 oz (108 kg) IBW/kg (Calculated) : 73  Ht: 69.5 in  Wt: 109.6kg  IBW: 72 kg Heparin Dosing Weight: 96 kg  Vital Signs: Temp: 96.1 F (35.6 C) (07/06 0800) Temp Source: Oral (07/06 0800) BP: 101/70 mmHg (07/06 0800) Pulse Rate: 104 (07/06 0900)  Labs:  Recent Labs  08/08/15 1933  08/09/15 0415  08/10/15 0615 08/10/15 1300 08/11/15 0530  HGB 8.5*  --  8.2*  --   --   --  9.1*  HCT 25.0*  --  26.8*  --   --   --  30.8*  PLT  --   --  300  --   --   --  294  LABPROT  --   --  49.9*  --  40.1*  --  32.0*  INR  --   --  5.75*  --  4.30*  --  3.18*  CREATININE 2.90*  < > 2.32*  < > 1.45* 1.39* 1.23  < > = values in this interval not displayed.  Estimated Creatinine Clearance: 65.8 mL/min (by C-G formula based on Cr of 1.23).   Medical History: Past Medical History  Diagnosis Date  . Obesity   . Phlebitis     Lower extermity  . Pulmonary emboli ([place]) 2008    submassive, saddle  . Prostate cancer ([place]) 07/2009  . Sleep apnea     on CPAP  . Hx of echocardiogram 12/04/2010    Normal EF >55% no significant valve disease  . History of stress test 06/27/2009    Low risk and EF of approximately 50%  . DVT (deep venous thrombosis) ([place])   . Chronic kidney disease, stage 3     baseline creatinine ~1.4  . HLD (hyperlipidemia)   . HTN (hypertension)   . Dysrhythmia     A fib  . Diabetes mellitus ([place])     diet controlled  . History of hiatal hernia     Assessment: 75 y.o. M presents with. Pt on coumadin PTA at SNF for h/o DVT and afib - per SNF MAR, coumadin currently on hold since 6/28 due to elevated INR.   INR elevated upon admission to hospital (4.75), now down in 3.1 today, last dose of Coumadin PTA was 6/28.   D/w ccm who would like warfarin to restart when INR <2.5 N0 plan for  heparin at this time will restart low dose warfarin tonight as he is likely to fall to less than 2.5 by tomorrow morning  Goal of Therapy:  INR 2-3 Monitor platelets by anticoagulation protocol: Yes   Plan:  Daily INR Warfarin 2.5mg  tonight  Erin Hearing PharmD., BCPS Clinical Pharmacist Pager [phone] 08/11/2015 10:01 AM

## 2015-08-11 NOTE — Progress Notes (Signed)
Pharmacy Antibiotic Note  [name] is a 76 y.o. male admitted on 08/07/2015 with sepsis and UTI.  Pharmacy has been consulted for vancomycin and cefepime dosing.  Patient with long recent complicated hospitalization.  Hx UTI with klebsiella.  Has chronic foley that is changed monthly.  Afeb, wbc normal, continues on CRRT, vancomycin stopped.   Plan: Continue primaxin 500 q8 while on CRRT -will need to adjust dose tomorrow if crrt is stopped Plan is for 14 days of IV abx  Height: 5\' 10"  (177.8 cm) Weight: 238 lb 1.6 oz (108 kg) IBW/kg (Calculated) : 73  Temp (24hrs), Avg:97.8 F (36.6 C), Min:96.1 F (35.6 C), Max:98.8 F (37.1 C)   Recent Labs Lab 08/07/15 1957 08/07/15 2007  08/07/15 2255 08/07/15 2308  08/09/15 0415 08/09/15 1717 08/10/15 0615 08/10/15 1300 08/11/15 0530  WBC 8.3  --   --  6.7  --   --  4.8  --   --   --  5.2  CREATININE 3.76*  --   < >  --   --   < > 2.32* 1.80* 1.45* 1.39* 1.23  LATICACIDVEN  --  2.01*  --   --  1.74  --   --   --   --   --   --   < > = values in this interval not displayed.  Estimated Creatinine Clearance: 65.8 mL/min (by C-G formula based on Cr of 1.23).    No Known Allergies  Antimicrobials this admission: 7/2 Zosyn x 1 7/2 Vanc>>7/6 7/3 primaxin>> 7/3 Anidulafungin x 1 (poor urine penetration) 7/3 Fluconazole >  Dose adjustments this admission: n/a  Microbiology results: 7/2 BCx x 2 >coag neg staph - likely contaminant, kleb pneuo -sensitive to unasyn, zosyn, primaxin, cipro 7/2 UCx > (gram stain with GNR, budding yeast, GPC) -suggest recollect 7/4 bld x2 - ngtd  Thank you for allowing pharmacy to be a part of this patient's care.  Erin Hearing PharmD., BCPS Clinical Pharmacist Pager [phone] 08/11/2015 1:29 PM

## 2015-08-11 NOTE — Telephone Encounter (Signed)
Called and LM for [name] (wife) to return call regarding pt status.   Called and LM for Orvan Falconer regarding pt's current admission and to request an extension on the current claim.

## 2015-08-11 NOTE — Telephone Encounter (Signed)
  08/11/2015 09:48 AM Phone (Incoming) [place],[name] (Emergency Contact) [phone] (H)    pts wife returning call regarding paperwork    Called pt wife, [name] - States that she is planning on calling [place] today and will let me know once she hears something. Pt wife also aware that I have called and inquired about an extension as well. Will await call back.

## 2015-08-11 NOTE — Telephone Encounter (Signed)
Duplicate call - see [phone] telephone note Will close this encounter

## 2015-08-11 NOTE — Progress Notes (Signed)
CVVHD management.  BP low at times but still pulling 150cc/hr.  Peripheral edema better.  Metabolically stable.  PO4 better.  Decrease PO4 supp to BID.  Cont thru tomorrow then will re assess.  His BP is biggest concern in terms of IHD.  Recheck CXR in AM

## 2015-08-11 NOTE — Progress Notes (Signed)
Daily Progress Note   Patient Name: [name]       Date: 08/11/2015 DOB: Apr 29, 1940  Age: 75 y.o. MRN#: [id number] Attending Physician: Rush Landmark, MD Primary Care Physician: Melinda Crutch Admit Date: 08/07/2015  Reason for Consultation/Follow-up: Establishing goals of care  Subjective: Patient understands that he will not be resuscitated if he were to naturally pass.  He also understands that an assessment will be done in the morning to attempt to determine whether or not he could tolerate hemodialysis.  If he can not tolerate it he understands that he will utilize comfort care and his life expectancy will be very limited.   With [name] (his wife) present we discussed comfort - both at residential hospice as well as in the hospital.  [name] wants to ensure that he will not suffer.    Mr. [name] agreed that while he wants to fight as long and as hard as he can - he wants take advantage of every appropriate medical intervention - however he also agrees that he will be ok if he dies.  He states he has died twice, he is not afraid.   He is concerned about how his sons (particularly [name]) will take it.  I advised that if his sons know he is a peace with it they will be more at peace with it.     Mr. [name] requested that I call two ministers - [name] and [name] xxxx and update them on his medical situation.  I had extended conversations with both ministers.  They both agreed to be here in the next 24 hours.     Length of Stay: 4  Current Medications: Scheduled Meds:  . antiseptic oral rinse  7 mL Mouth Rinse BID  . budesonide (PULMICORT) nebulizer solution  0.5 mg Nebulization BID  . darbepoetin (ARANESP) injection - DIALYSIS  100 mcg Intravenous Q Mon-HD  . famotidine (PEPCID) IV  20  mg Intravenous Q12H  . feeding supplement (NEPRO CARB STEADY)  237 mL Oral BID BM  . feeding supplement (PRO-STAT SUGAR FREE 64)  30 mL Oral BID  . imipenem-cilastatin  500 mg Intravenous Q8H  . insulin aspart  0-15 Units Subcutaneous TID WC  . ipratropium-albuterol  3 mL Nebulization QID  . midodrine  10 mg Oral TID WC  .  multivitamin  1 tablet Oral QHS  . nystatin  5 mL Oral QID  . potassium & sodium phosphates  1 packet Oral BID WC  . sodium chloride flush  3 mL Intravenous Q12H  . warfarin  2.5 mg Oral ONCE-1800  . Warfarin - Pharmacist Dosing Inpatient   Does not apply q1800    Continuous Infusions: . dialysis replacement fluid (prismasate) 300 mL/hr at 08/11/15 1219  . dialysis replacement fluid (prismasate) 200 mL/hr at 08/10/15 1836  . dialysate (PRISMASATE) 1,500 mL/hr at 08/11/15 0808    PRN Meds: fentaNYL (SUBLIMAZE) injection, heparin, HYDROcodone-acetaminophen, ipratropium-albuterol, ondansetron (ZOFRAN) IV, ondansetron **OR** [DISCONTINUED] ondansetron (ZOFRAN) IV, polyvinyl alcohol, simethicone, sodium chloride  Physical Exam         Wd ill appearing male,  Appears more fatigued to day.  On CRRT in ICU, under a bair hugger CV rrr resp NAD LE with 2-3+ pitting edema - but less that 7/5   Vital Signs: BP 95/68 mmHg  Pulse 108  Temp(Src) 97.4 F (36.3 C) (Oral)  Resp 19  Ht 5\' 10"  (1.778 m)  Wt 108 kg (238 lb 1.6 oz)  BMI 34.16 kg/m2  SpO2 100% SpO2: SpO2: 100 % O2 Device: O2 Device: Not Delivered O2 Flow Rate: O2 Flow Rate (L/min): 1 L/min  Intake/output summary:  Intake/Output Summary (Last 24 hours) at 08/11/15 1352 Last data filed at 08/11/15 1304  Gross per 24 hour  Intake   1930 ml  Output   4518 ml  Net  -2588 ml   LBM: Last BM Date: 08/11/15 Baseline Weight: Weight: 112.2 kg (247 lb 5.7 oz) Most recent weight: Weight: 108 kg (238 lb 1.6 oz)       Palliative Assessment/Data:    Flowsheet Rows        Most Recent Value   Intake Tab     Referral Department  Critical care   Unit at Time of Referral  ICU   Palliative Care Primary Diagnosis  Other (Comment)   Date Notified  08/08/15   Palliative Care Type  New Palliative care   Reason for referral  Clarify Goals of Care   Date of Admission  08/07/15   Date first seen by Palliative Care  08/08/15   # of days Palliative referral response time  0 Day(s)   # of days IP prior to Palliative referral  1   Clinical Assessment    Palliative Performance Scale Score  30%   Psychosocial & Spiritual Assessment    Palliative Care Outcomes    Patient/Family meeting held?  Yes      Patient Active Problem List   Diagnosis Date Noted  . Palliative care encounter   . Goals of care, counseling/discussion   . Severe sepsis ([place])   . CKD (chronic kidney disease)   . Pressure ulcer 08/08/2015  . Acute respiratory failure with hypoxia ([place])   . UTI (lower urinary tract infection)   . ESRD (end stage renal disease) ([place])   . Hypocalcemia   . Acute encephalopathy   . Chronic anemia   . Fecal occult blood test positive   . HCAP (healthcare-associated pneumonia)   . Sepsis ([place])   . Elevated lactic acid level   . Elevated troponin   . Sepsis secondary to UTI ([place]) 08/07/2015  . Hypoalbuminemia 07/27/2015  . Pneumothorax   . Traumatic pneumothorax   . OSA on CPAP   . Acute on chronic renal failure ([place])   . Hemorrhagic shock   . S/p  nephrectomy   . Sepsis due to Klebsiella ([place])   . OSA (obstructive sleep apnea)   . Chronic diastolic CHF (congestive heart failure) ([place])   . Cardiac arrest ([place])   . Acute deep vein thrombosis (DVT) of distal vein of right lower extremity ([place])   . Acute renal failure (ARF) ([place])   . Acute hypoxemic respiratory failure ([place])   . Acute back pain   . Bleeding   . Chest tube in place   . Perinephric hematoma   . Right flank pain   . Acute respiratory failure ([place])   . Bacteremia due to Klebsiella pneumoniae   . Secondary cardiomyopathy ([place])     . Chronic atrial fibrillation ([place])   . Uncontrolled type 2 diabetes mellitus with complication ([place])   . Dyspnea   . Acute renal failure ([place]) 06/02/2015  . Encounter for central line placement   . Septic shock ([place]) 05/31/2015  . Renal failure   . Urinary tract infectious disease   . Arterial hypotension   . Increased anion gap metabolic acidosis   . Hyponatremia   . Rotator cuff tear 05/03/2015  . Atrial fibrillation ([place]) 11/10/2014  . Obesity (BMI 30-39.9) 11/10/2014  . Type II or unspecified type diabetes mellitus with unspecified complication, uncontrolled 04/06/2013  . Anemia 04/04/2013  . 1St degree AV block 03/07/2013  . Acute blood loss anemia 03/06/2013  . Acute-on-chronic kidney injury, baseline CKD stage 3 03/06/2013  . Hypotension, unspecified 03/06/2013  . Bradycardia 03/06/2013  . Perineal pain 03/06/2013  . Preoperative clearance 03/03/2013  . Gross hematuria 03/02/2013  . Severe obstructive sleep apnea 12/04/2012  . Morbid obesity ([place]) 12/04/2012  . Bilateral lower extremity edema 12/04/2012  . Venous insufficiency 12/04/2012  . History of nephrectomy, unilateral 12/04/2012  . Chronic anticoagulation 12/04/2012  . History of pulmonary embolism 12/04/2012  . Essential hypertension 12/04/2012  . Cardiac murmur 12/04/2012    Palliative Care Assessment & Plan   Patient Profile: 75 y.o. male with past medical history of prostate CA s/p radiation 2011 and RCC s/p left nephrectomy,chronic foley cath, sleep apnea on CPAP, prior DVT and saddle PE, CK-MB stage III, HTN, DM. Recent prolonged admission 05/31/2015-07/31/2014 with AKI, Klebsiella bacteremia, peritoneal right perinephric hematoma, cardiac arrest, post CPR left pneumothorax requiring chest tube placement, intubated x 2, and also ESRD and initiated hemodialysis. Admitted on 08/07/2015 with septic shock d/t UTI vs HCAP.  Assessment: Despite his incredible spirit Mr. Ferrar appears to be dying slowly.  He is  still requiring midodrine, bair hugger, and CRRT.     Recommendations/Plan:  PMT will follow along after PCCM and Nephrology assessments on 7/7 am.  We will support in what ever way is appropriate. We have discussed comfort measures with the patient and his wife.   Code Status: DNR  Prognosis:   Unfortunately a hospital death is likely.  Discharge Planning:  Anticipated Hospital Death vs Residential Hospice.  Care plan was discussed with bedside RN, PCCM, Patient and wife as well as 2 ministers.  Thank you for allowing the Palliative Medicine Team to assist in the care of this patient.   Time In: 1:00 Time Out: 2:00 Total Time 60 min Prolonged Time Billed no      Greater than 50%  of this time was spent counseling and coordinating care related to the above assessment and plan.  Imogene Burn, PA-C Palliative Medicine Pager: [phone]  Please contact Palliative Medicine Team phone at [phone] for questions and concerns.

## 2015-08-12 ENCOUNTER — Inpatient Hospital Stay (HOSPITAL_COMMUNITY): Payer: BLUE CROSS/BLUE SHIELD

## 2015-08-12 LAB — CBC WITH DIFFERENTIAL/PLATELET
BASOS ABS: 0 10*3/uL (ref 0.0–0.1)
Basophils Relative: 0 %
EOS PCT: 1 %
Eosinophils Absolute: 0 10*3/uL (ref 0.0–0.7)
HEMATOCRIT: 29.9 % — AB (ref 39.0–52.0)
Hemoglobin: 9 g/dL — ABNORMAL LOW (ref 13.0–17.0)
LYMPHS ABS: 0.8 10*3/uL (ref 0.7–4.0)
LYMPHS PCT: 17 %
MCH: 27.5 pg (ref 26.0–34.0)
MCHC: 30.1 g/dL (ref 30.0–36.0)
MCV: 91.4 fL (ref 78.0–100.0)
MONO ABS: 0.4 10*3/uL (ref 0.1–1.0)
Monocytes Relative: 9 %
NEUTROS ABS: 3.2 10*3/uL (ref 1.7–7.7)
Neutrophils Relative %: 73 %
PLATELETS: 225 10*3/uL (ref 150–400)
RBC: 3.27 MIL/uL — ABNORMAL LOW (ref 4.22–5.81)
RDW: 17.5 % — AB (ref 11.5–15.5)
WBC: 4.5 10*3/uL (ref 4.0–10.5)

## 2015-08-12 LAB — MAGNESIUM: Magnesium: 2.2 mg/dL (ref 1.7–2.4)

## 2015-08-12 LAB — RENAL FUNCTION PANEL
Albumin: 1.8 g/dL — ABNORMAL LOW (ref 3.5–5.0)
Anion gap: 5 (ref 5–15)
BUN: 16 mg/dL (ref 6–20)
CHLORIDE: 104 mmol/L (ref 101–111)
CO2: 27 mmol/L (ref 22–32)
CREATININE: 1.34 mg/dL — AB (ref 0.61–1.24)
Calcium: 7.9 mg/dL — ABNORMAL LOW (ref 8.9–10.3)
GFR calc Af Amer: 59 mL/min — ABNORMAL LOW (ref >60)
GFR calc non Af Amer: 51 mL/min — ABNORMAL LOW (ref >60)
GLUCOSE: 112 mg/dL — AB (ref 65–99)
POTASSIUM: 4.3 mmol/L (ref 3.5–5.1)
Phosphorus: 2.3 mg/dL — ABNORMAL LOW (ref 2.5–4.6)
Sodium: 136 mmol/L (ref 135–145)

## 2015-08-12 LAB — PROTIME-INR
INR: 2.57 — AB (ref 0.00–1.49)
PROTHROMBIN TIME: 27.2 s — AB (ref 11.6–15.2)

## 2015-08-12 LAB — GLUCOSE, CAPILLARY
GLUCOSE-CAPILLARY: 111 mg/dL — AB (ref 65–99)
GLUCOSE-CAPILLARY: 128 mg/dL — AB (ref 65–99)
Glucose-Capillary: 126 mg/dL — ABNORMAL HIGH (ref 65–99)
Glucose-Capillary: 144 mg/dL — ABNORMAL HIGH (ref 65–99)

## 2015-08-12 MED ORDER — WARFARIN SODIUM 2.5 MG PO TABS
2.5000 mg | ORAL_TABLET | Freq: Once | ORAL | Status: AC
  Administered 2015-08-12: 2.5 mg via ORAL
  Filled 2015-08-12: qty 1

## 2015-08-12 MED ORDER — FAMOTIDINE IN NACL 20-0.9 MG/50ML-% IV SOLN
20.0000 mg | INTRAVENOUS | Status: DC
  Administered 2015-08-13 – 2015-08-14 (×2): 20 mg via INTRAVENOUS
  Filled 2015-08-12 (×3): qty 50

## 2015-08-12 MED ORDER — IPRATROPIUM-ALBUTEROL 0.5-2.5 (3) MG/3ML IN SOLN: 3.0000 mL | RESPIRATORY_TRACT | Status: DC | PRN

## 2015-08-12 MED ORDER — IPRATROPIUM-ALBUTEROL 0.5-2.5 (3) MG/3ML IN SOLN
3.0000 mL | Freq: Two times a day (BID) | RESPIRATORY_TRACT | Status: DC
  Administered 2015-08-12 – 2015-08-20 (×15): 3 mL via RESPIRATORY_TRACT
  Filled 2015-08-12 (×16): qty 3

## 2015-08-12 MED ORDER — LORAZEPAM 0.5 MG PO TABS
0.5000 mg | ORAL_TABLET | Freq: Once | ORAL | Status: AC
  Administered 2015-08-14: 0.5 mg via ORAL
  Filled 2015-08-12 (×2): qty 1

## 2015-08-12 MED ORDER — METOCLOPRAMIDE HCL 5 MG/ML IJ SOLN
5.0000 mg | Freq: Three times a day (TID) | INTRAMUSCULAR | Status: DC
  Administered 2015-08-12 – 2015-08-15 (×10): 5 mg via INTRAVENOUS
  Filled 2015-08-12 (×10): qty 2

## 2015-08-12 MED ORDER — SODIUM CHLORIDE 0.9 % IV SOLN
250.0000 mg | Freq: Two times a day (BID) | INTRAVENOUS | Status: DC
  Administered 2015-08-12 – 2015-08-18 (×12): 250 mg via INTRAVENOUS
  Filled 2015-08-12 (×14): qty 250

## 2015-08-12 NOTE — Progress Notes (Signed)
Sent text/page to MD [facility] with Nephrology to inform CRRT was stopped at 0445 due to filter clotting.

## 2015-08-12 NOTE — Evaluation (Signed)
Physical Therapy Evaluation Patient Details Name: [name] MRN: [id number] DOB: 1940-12-07 Today's Date: 08/12/2015   History of Present Illness  Pt re-admitted 08/07/15 (after having being admitted from 4/25-6/25/2017) with fatigue, somnolence, fever. He was found to have UTI. Has been on CVVHD/CRRT until today (08/12/2015) with now looking to attempt if he can tolerate normal HD.    Clinical Impression  Pt admitted with above diagnosis. Pt currently with functional limitations due to the deficits listed below (see PT Problem List). PTA pt from SNF where they were using lift to assist pt to [place].  He currently requires total assist for bed mobility but is very motivated and participate in therapeutic exercises.  Additionally, pt sat EOB ~10 minutes w/ min guard assist and 1 UE supported.  Given pt's current medical status, recommending LTACH. Pt will benefit from skilled PT to increase their independence and safety with mobility to allow discharge to the venue listed below.      Follow Up Recommendations LTACH (CIR possibly in the future depending on progress)    Equipment Recommendations  Other (comment) (TBD at next venue of care)    Recommendations for Other Services       Precautions / Restrictions Precautions Precautions: Fall Type of Shoulder Precautions: RTC repair 04/2015 - per Cay Schillings 6/5 pt can use his RUE however he wants within his pain tolerance. Only self ROM (this was latest clarification from last admission) Restrictions Other Position/Activity Restrictions: Self ROM Rt UE      Mobility  Bed Mobility Overal bed mobility: Needs Assistance;+2 for physical assistance Bed Mobility: Supine to Sit;Sit to Supine     Supine to sit: Total assist;+2 for physical assistance;HOB elevated Sit to supine: Total assist   General bed mobility comments: Assist for all aspects of bed mobility, utilized bed pad for positioning to scoot to EOB.  Transfers Overall transfer  level: Needs assistance Equipment used: Rolling walker (2 wheeled) Transfers: Sit to/from Stand Sit to Stand: +2 physical assistance;+2 safety/equipment;From elevated surface;Total assist         General transfer comment: Attempted sit>stand x2 but pt's buttocks does not clear bed with total +2 assist and use of RW.  Ambulation/Gait             General Gait Details: unable at this time  Stairs            Wheelchair Mobility    Modified Rankin (Stroke Patients Only)       Balance Overall balance assessment: Needs assistance Sitting-balance support: Feet supported;Single extremity supported Sitting balance-Leahy Scale: Poor Sitting balance - Comments: Pt required 1 UE support at time while sitting EOB.  Sat EOB ~ 10 minutes                                     Pertinent Vitals/Pain Pain Assessment: Faces Faces Pain Scale: Hurts a little bit Pain Location: bottom ("it feels like there is a crease in my bed pad") Pain Descriptors / Indicators: Nagging Pain Intervention(s): Monitored during session;Repositioned    Home Living Family/patient expects to be discharged to:: Other (Comment) (LTACH) Living Arrangements: Spouse/significant other Available Help at Discharge: Family;Available 24 hours/day Type of Home: House Home Access: Stairs to enter   [employer] of Steps: 3 Home Layout: Multi-level;Able to live on main level with bedroom/bathroom Home Equipment: Other (comment) (lift chair) Additional Comments: Has chronic foley    Prior Function  Level of Independence: Independent   Gait / Transfers Assistance Needed: amb modified independent without assistive device.  Pt reports staff at SNF have been using lift to assit to chair and staff has to push him once in [place].     Comments: worked in Teacher, adult education   Dominant Hand: Left (has been mainly right handed, but now with minimal right sho)    Extremity/Trunk  Assessment   Upper Extremity Assessment: Defer to OT evaluation           Lower Extremity Assessment: Generalized weakness RLE Deficits / Details: grossly 2-/5 LLE Deficits / Details: grossly 2-/5     Communication   Communication: Other (comment) (soft vocal quality)  Cognition Arousal/Alertness: Awake/alert Behavior During Therapy: WFL for tasks assessed/performed Overall Cognitive Status: Within Functional Limits for tasks assessed                      General Comments      Exercises General Exercises - Lower Extremity Ankle Circles/Pumps: AROM;Both;10 reps;Supine Quad Sets: Strengthening;Both;5 reps;Supine Hip ABduction/ADduction: AAROM;Both;Supine;10 reps      Assessment/Plan    PT Assessment Patient needs continued PT services  PT Diagnosis Difficulty walking;Generalized weakness   PT Problem List Decreased strength;Decreased range of motion;Decreased activity tolerance;Decreased mobility;Decreased balance;Decreased knowledge of use of DME;Decreased safety awareness  PT Treatment Interventions DME instruction;Functional mobility training;Therapeutic activities;Therapeutic exercise;Balance training;Neuromuscular re-education;Patient/family education;Wheelchair mobility training   PT Goals (Current goals can be found in the Care Plan section) Acute Rehab PT Goals Patient Stated Goal: to be able to stand up PT Goal Formulation: With patient/family Time For Goal Achievement: 08/26/15 Potential to Achieve Goals: Fair    Frequency Min 2X/week   Barriers to discharge Inaccessible home environment Steps to enter home    Co-evaluation               End of Session Equipment Utilized During Treatment: Gait belt Activity Tolerance: Patient limited by fatigue Patient left: in bed;with call bell/phone within reach;with bed alarm set;Other (comment) (on bed pan; nurse tech assisted with this and aware) Nurse Communication: Mobility status;Other  (comment);Need for lift equipment (pt on bed pan)         Time: 1450-1520 PT Time Calculation (min) (ACUTE ONLY): 30 min   Charges:   PT Evaluation $PT Eval Moderate Complexity: 1 Procedure PT Treatments $Therapeutic Exercise: 8-22 mins   PT G Codes:        Collie Siad PT, DPT  Pager: [phone] Phone: [phone] 08/12/2015, 3:40 PM

## 2015-08-12 NOTE — Progress Notes (Signed)
Rehab Admissions Coordinator Note:  Patient was screened by Retta Diones for appropriateness for an Inpatient Acute Rehab Consult.  At this time, we are recommending LTACH.  If patient makes good progress and becomes medically stable, may be able to consider inpatient rehab consult if he does not go to [facility].  Jodell Cipro M 08/12/2015, 11:00 AM  I can be reached at [phone].

## 2015-08-12 NOTE — Evaluation (Addendum)
Occupational Therapy Evaluation Patient Details Name: [name] MRN: [id number] DOB: 1940/11/26 Today's Date: 08/12/2015    History of Present Illness Pt re-admitted 08/07/15 (after having being admitted from 4/25-6/25/2017) with fatigue, somnolence, fever. He was found to have UTI. Has been on CVVHD/CRRT until today (08/12/2015) with now looking to attempt if he can tolerate normal HD.   Clinical Impression   This 75 yo male admitted with above with recent 2 month stay in hospital presents to acute OT motivated to do as much as he can tolerate to "I want to walk out of here". His course is complicated and now the big issue is if the will tolerate normal HD. If so feel pt would benefit from [facility] if needs to continue to be monitored more closely due medical issues he has had of recent, but if not then feel CIR should be considered to help pt get back to as independent level as possible. Will continue to follow.    Follow Up Recommendations  LTACH;Other (comment) (if remains medically complex, if not then CIR should be a consideration)    Equipment Recommendations   (TBD at next venue)    Recommendations for Other Services Rehab consult     Precautions / Restrictions Precautions Precautions: Fall Type of Shoulder Precautions: RTC repair 04/2015 - per Cay Schillings 6/5 pt can use his RUE however he wants within his pain tolerance. Only self ROM (this was latest clarification from last admission) Precaution Comments: monitor vitals (HR from 99 at rest up to mid 130s), BP actually went up upon sitting up (BP has been soft) Restrictions Weight Bearing Restrictions: No      Mobility Bed Mobility Overal bed mobility: Needs Assistance;+2 for physical assistance Bed Mobility: Supine to Sit;Sit to Supine;Rolling Rolling: Max assist (left and right using bed pad)   Supine to sit: Total assist;+2 for physical assistance;HOB elevated (using bed bad to help spin him around to sit EOB) Sit to  supine: Total assist;+2 for physical assistance      Transfers                      Balance Overall balance assessment: Needs assistance Sitting-balance support: Feet supported (varied between no support, single and bil UE support) Sitting balance-Leahy Scale: Poor Sitting balance - Comments: Pt could bring his weight off of me when asked and could do this several repetittions                                    ADL Overall ADL's : Needs assistance/impaired Eating/Feeding: Set up;Supervision/ safety;Bed level Eating/Feeding Details (indicate cue type and reason): if food and positon in bed is set up in such a way that he can access it with his LUE Grooming: Wash/dry hands;Wash/dry face;Supervision/safety;Set up;Bed level   Upper Body Bathing: Minimal assitance;Bed level   Lower Body Bathing: Total assistance;Bed level   Upper Body Dressing : Total assistance;Bed level   Lower Body Dressing: Total assistance;Bed level                 General ADL Comments: Did help pt sit on EOB; however for him to be able to perform any UBADLs he will need back support and even then his back fatigues quickly               Pertinent Vitals/Pain Pain Assessment: Faces Faces Pain Scale: Hurts little more Pain Location: back (  the longer he sat up on EOB)--"right down the middle" Pain Descriptors / Indicators: Aching;Sore Pain Intervention(s): Monitored during session;Repositioned     Hand Dominance Left (has been mainly right handed, but now with minimal right shoulder movement he is dominatly left handed now)   Extremity/Trunk Assessment Upper Extremity Assessment Upper Extremity Assessment: RUE deficits/detail RUE Deficits / Details: s/p R RTC repair 04/2015 (see precautions for clarification of movement allowed)--minimal AROM with abnormal movement pattern when attempting to lift arm; elbow distally WNL  RUE Coordination: decreased gross motor   Lower  Extremity Assessment Lower Extremity Assessment: Defer to PT evaluation       Communication Communication Communication: Other (comment) (soft vocal quality)   Cognition Arousal/Alertness: Awake/alert Behavior During Therapy: WFL for tasks assessed/performed Overall Cognitive Status: Within Functional Limits for tasks assessed                        Exercises   Other Exercises Other Exercises: Had him peform leg kicks, knee raises with Bil LEs, and lap slides with Bil UEs while seated EOB        Home Living Family/patient expects to be discharged to:: Inpatient rehab Living Arrangements: Spouse/significant other Available Help at Discharge: Family;Available 24 hours/day Type of Home: House Home Access: Stairs to enter [employer] of Steps: 3   Home Layout: Multi-level;Able to live on main level with bedroom/bathroom Alternate Level Stairs-Number of Steps: 4-5   Bathroom Shower/Tub: Occupational psychologist: Handicapped height     Home Equipment:  (lift chair)   Additional Comments: Has chronic foley      Prior Functioning/Environment Level of Independence: Independent  Gait / Transfers Assistance Needed: amb modified independent without assistive device--until hospital stay from 4/25-6/25 2017     Comments: worked in Pharmacologist    OT Diagnosis: Generalized weakness;Acute pain   OT Problem List: Decreased strength;Decreased range of motion;Decreased activity tolerance;Impaired balance (sitting and/or standing);Pain;Impaired UE functional use;Cardiopulmonary status limiting activity;Obesity;Decreased knowledge of use of DME or AE   OT Treatment/Interventions: Self-care/ADL training;Therapeutic exercise;DME and/or AE instruction;Therapeutic activities;Patient/family education;Balance training    OT Goals(Current goals can be found in the care plan section) Acute Rehab OT Goals Patient Stated Goal: to be able to walk out of here OT Goal  Formulation: With patient/family Time For Goal Achievement: 08/26/15 Potential to Achieve Goals: Good  OT Frequency: Min 2X/week              End of Session Nurse Communication: Patient requests pain meds  Activity Tolerance:  (limited by back fatigue while seated EOB; however kept wanting to try and do what was asked of him) Patient left: in bed;with call bell/phone within reach;with family/visitor present   Time: 0811-0851 OT Time Calculation (min): 40 min Charges:  OT General Charges $OT Visit: 1 Procedure OT Evaluation $OT Eval High Complexity: 1 Procedure OT Treatments $Therapeutic Activity: 23-37 mins  Almon Register [id number] 08/12/2015, 10:18 AM

## 2015-08-12 NOTE — Progress Notes (Signed)
Pharmacy Antibiotic Note JOYNER FOLK is a 75 y.o. male admitted on 08/07/2015 that is currently on day 5 of planned 21 with Primaxin for ESBL K. Pneumoniae bacteremia.    CRRT stopped and plan to transition to IHD starting on 7/8 ( Pt was ESRD MWF PTA)   Plan: - Adjust Primaxin to 250 mg every 12 hours  - F/u on toleration of IHD and adjust dose as needed    Height: 5\' 10"  (177.8 cm) Weight: 229 lb 0.9 oz (103.9 kg) IBW/kg (Calculated) : 73  Temp (24hrs), Avg:98 F (36.7 C), Min:96.7 F (35.9 C), Max:98.9 F (37.2 C)   Recent Labs Lab 08/07/15 1957 08/07/15 2007  08/07/15 2255 08/07/15 2308  08/09/15 0415  08/10/15 0615 08/10/15 1300 08/11/15 0530 08/11/15 1600 08/12/15 0400  WBC 8.3  --   --  6.7  --   --  4.8  --   --   --  5.2  --  4.5  CREATININE 3.76*  --   < >  --   --   < > 2.32*  < > 1.45* 1.39* 1.23 1.29* 1.34*  LATICACIDVEN  --  2.01*  --   --  1.74  --   --   --   --   --   --   --   --   < > = values in this interval not displayed.  Estimated Creatinine Clearance: 59.3 mL/min (by C-G formula based on Cr of 1.34).    No Known Allergies  Antimicrobials this admission: 7/2 Zosyn x 1 7/2 Vanc>>7/6 7/3 Anidulafungin x 1 (poor urine penetration)  7/3 Primaxin >>   Dose adjustments this admission: n/a   Microbiology results: 7/2 BCx x 2 > kleb pneuo ESBL -sensitive to zosyn, primaxin, cipro 7/2 UCx > -suggest recollect 7/4 bld x2 - ngtd  Thank you for allowing pharmacy to be a part of this patient's care.  Vincenza Hews, PharmD, BCPS 08/12/2015, 1:26 PM Pager: [phone]

## 2015-08-12 NOTE — Progress Notes (Signed)
Paged MD Lorrene Reid with nephrology to discuss CRRT.  Based on CRRT numbers, filter is beginning to clot and will likely require changing very soon.  Per notes, CRRT may be stopped today and RN unsure if filter should be changed or if CRRT should be paused if filter clots pending MD morning rounds.    MD Lorrene Reid advised if filter clots to stop CRRT pending morning rounds but for RN to page oncoming MD after 0700 to inform them of this so they are aware CRRT has been stopped.

## 2015-08-12 NOTE — Progress Notes (Signed)
Patient is unable to tolerate CPAP. Patient is resting comfortably on room air with sats of 100% at this time.

## 2015-08-12 NOTE — Progress Notes (Signed)
Daily Progress Note   Patient Name: [name]       Date: 08/12/2015 DOB: 1940-11-28  Age: 75 y.o. MRN#: [id number] Attending Physician: Rush Landmark, MD Primary Care Physician: Melinda Crutch Admit Date: 08/07/2015  Reason for Consultation/Follow-up: Establishing goals of care  Subjective: [name] and wife [name] are well with no complaints.  [name] remains full of positive spirit and will to live. However he agree to DNR, DNI, No pressors.  CRRT clotted and was turned off today.  He is supposed to attempt HD now.  If he can tolerate HD, he will go full speed ahead with recovery and rehab.  If he does not tolerate HD he will become comfort care.  He tells me his sons don't know that he is a DNR yet.  If they call me - he asks that I give his son the medical facts including DNR status.  He is concerned about money, paying his medical bills and his families finances.  [name]) asks if he could help people by doing Palliative Care.  I told him that I thought he was imminently qualified (given his experiences) and that we would love to have him on the team as a volunteer when he improves.   This seemed to please Ronald Pippins a great deal.  More than anything he wants to have a positive impact on other people.     Length of Stay: 5  Current Medications: Scheduled Meds:  . antiseptic oral rinse  7 mL Mouth Rinse BID  . budesonide (PULMICORT) nebulizer solution  0.5 mg Nebulization BID  . darbepoetin (ARANESP) injection - DIALYSIS  100 mcg Intravenous Q Mon-HD  . [START ON 08/13/2015] famotidine (PEPCID) IV  20 mg Intravenous Q24H  . feeding supplement (NEPRO CARB STEADY)  237 mL Oral BID BM  . feeding supplement (PRO-STAT SUGAR FREE 64)  30 mL Oral BID  . imipenem-cilastatin  250 mg  Intravenous Q12H  . insulin aspart  0-15 Units Subcutaneous TID WC  . ipratropium-albuterol  3 mL Nebulization BID  . metoCLOPramide (REGLAN) injection  5 mg Intravenous Q8H  . midodrine  10 mg Oral TID WC  . multivitamin  1 tablet Oral QHS  . nystatin  5 mL Oral QID  . potassium & sodium phosphates  1 packet  Oral BID WC  . sodium chloride flush  3 mL Intravenous Q12H  . warfarin  2.5 mg Oral ONCE-1800  . Warfarin - Pharmacist Dosing Inpatient   Does not apply q1800    Continuous Infusions:    PRN Meds: fentaNYL (SUBLIMAZE) injection, HYDROcodone-acetaminophen, ipratropium-albuterol, ondansetron (ZOFRAN) IV, ondansetron **OR** [DISCONTINUED] ondansetron (ZOFRAN) IV, polyvinyl alcohol, simethicone  Physical Exam         Wd ill appearing male,  Transferring from [place] to 28E.  Wife at bedside. CV irreg. resp NAD, breath sounds decreased on left.   LE with 2-3+ pitting edema - but less that 7/5   Vital Signs: BP 104/68 mmHg  Pulse 106  Temp(Src) 98.2 F (36.8 C) (Oral)  Resp 20  Ht 5\' 10"  (1.778 m)  Wt 103.9 kg (229 lb 0.9 oz)  BMI 32.87 kg/m2  SpO2 96% SpO2: SpO2: 96 % O2 Device: O2 Device: Not Delivered O2 Flow Rate: O2 Flow Rate (L/min): 1 L/min  Intake/output summary:   Intake/Output Summary (Last 24 hours) at 08/12/15 1416 Last data filed at 08/12/15 0900  Gross per 24 hour  Intake   1053 ml  Output   3025 ml  Net  -1972 ml   LBM: Last BM Date: 08/10/15 Baseline Weight: Weight: 112.2 kg (247 lb 5.7 oz) Most recent weight: Weight: 103.9 kg (229 lb 0.9 oz)       Palliative Assessment/Data:    Flowsheet Rows        Most Recent Value   Intake Tab    Referral Department  Critical care   Unit at Time of Referral  ICU   Palliative Care Primary Diagnosis  Other (Comment)   Date Notified  08/08/15   Palliative Care Type  New Palliative care   Reason for referral  Clarify Goals of Care   Date of Admission  08/07/15   Date first seen by Palliative Care  08/08/15     # of days Palliative referral response time  0 Day(s)   # of days IP prior to Palliative referral  1   Clinical Assessment    Palliative Performance Scale Score  30%   Psychosocial & Spiritual Assessment    Palliative Care Outcomes    Patient/Family meeting held?  Yes      Patient Active Problem List   Diagnosis Date Noted  . Encounter for hospice care discussion   . Palliative care encounter   . Goals of care, counseling/discussion   . Severe sepsis ([place])   . CKD (chronic kidney disease)   . Pressure ulcer 08/08/2015  . Acute respiratory failure with hypoxia ([place])   . UTI (lower urinary tract infection)   . ESRD (end stage renal disease) ([place])   . Hypocalcemia   . Acute encephalopathy   . Chronic anemia   . Fecal occult blood test positive   . HCAP (healthcare-associated pneumonia)   . Sepsis ([place])   . Elevated lactic acid level   . Elevated troponin   . Sepsis secondary to UTI ([place]) 08/07/2015  . Hypoalbuminemia 07/27/2015  . Pneumothorax   . Traumatic pneumothorax   . OSA on CPAP   . Acute on chronic renal failure ([place])   . Hemorrhagic shock   . S/p nephrectomy   . Sepsis due to Klebsiella ([place])   . OSA (obstructive sleep apnea)   . Chronic diastolic CHF (congestive heart failure) ([place])   . Cardiac arrest ([place])   . Acute deep vein thrombosis (DVT) of distal vein of  right lower extremity ([place])   . Acute renal failure (ARF) ([place])   . Acute hypoxemic respiratory failure ([place])   . Acute back pain   . Bleeding   . Chest tube in place   . Perinephric hematoma   . Right flank pain   . Acute respiratory failure ([place])   . Bacteremia due to Klebsiella pneumoniae   . Secondary cardiomyopathy ([place])   . Chronic atrial fibrillation ([place])   . Uncontrolled type 2 diabetes mellitus with complication ([place])   . Dyspnea   . Acute renal failure ([place]) 06/02/2015  . Encounter for central line placement   . Septic shock ([place]) 05/31/2015  . Renal failure   . Urinary tract  infectious disease   . Arterial hypotension   . Increased anion gap metabolic acidosis   . Hyponatremia   . Rotator cuff tear 05/03/2015  . Atrial fibrillation ([place]) 11/10/2014  . Obesity (BMI 30-39.9) 11/10/2014  . Type II or unspecified type diabetes mellitus with unspecified complication, uncontrolled 04/06/2013  . Anemia 04/04/2013  . 1St degree AV block 03/07/2013  . Acute blood loss anemia 03/06/2013  . Acute-on-chronic kidney injury, baseline CKD stage 3 03/06/2013  . Hypotension, unspecified 03/06/2013  . Bradycardia 03/06/2013  . Perineal pain 03/06/2013  . Preoperative clearance 03/03/2013  . Gross hematuria 03/02/2013  . Severe obstructive sleep apnea 12/04/2012  . Morbid obesity ([place]) 12/04/2012  . Bilateral lower extremity edema 12/04/2012  . Venous insufficiency 12/04/2012  . History of nephrectomy, unilateral 12/04/2012  . Chronic anticoagulation 12/04/2012  . History of pulmonary embolism 12/04/2012  . Essential hypertension 12/04/2012  . Cardiac murmur 12/04/2012    Palliative Care Assessment & Plan   Patient Profile: 75 y.o. male with past medical history of prostate CA s/p radiation 2011 and RCC s/p left nephrectomy,chronic foley cath, sleep apnea on CPAP, prior DVT and saddle PE, CK-MB stage III, HTN, DM. Recent prolonged admission 05/31/2015-07/31/2014 with AKI, Klebsiella bacteremia, peritoneal right perinephric hematoma, cardiac arrest x 2, post CPR left pneumothorax requiring chest tube placement, intubated x 2, and also ESRD and initiated hemodialysis. Admitted on 08/07/2015 with septic shock d/t UTI vs HCAP.  Assessment: Despite his incredible spirit Mr. Moes appears to be dying slowly.  I am really pulling for him - but I am relieved that he is at peace if his body fails him and he dies.   Recommendations/Plan:  PMT will follow along to support and monitor how he tolerates HD.  We will support in what ever way is appropriate. We have discussed comfort  measures with the patient and his wife.   Code Status: DNR  Prognosis:   Unfortunately a hospital death a definitive possibility.    Discharge Planning:  Patient hopeful for D/C to SNF.  We will know more when it is determined whether or not he can tolerate HD.  Care plan was discussed with bedside RN, patient and wife.  Thank you for allowing the Palliative Medicine Team to assist in the care of this patient.   Time In: 11:35 Time Out: 12:05 Total Time 30 Prolonged Time Billed no      Greater than 50%  of this time was spent counseling and coordinating care related to the above assessment and plan.  Imogene Burn, PA-C Palliative Medicine Pager: [phone]  Please contact Palliative Medicine Team phone at [phone] for questions and concerns.

## 2015-08-12 NOTE — Progress Notes (Signed)
Patient arrived on unit from [facility] via bed, wife at bed side. Spoke with Dr. Larkin Ina to clarify transfer orders.

## 2015-08-12 NOTE — Progress Notes (Addendum)
[facility] Progress Note   Subjective: no c/o's. Weights down 7kg from admission, no SOB  Filed Vitals:   08/12/15 0837 08/12/15 0843 08/12/15 0900 08/12/15 1000  BP:  120/79 103/72 94/75  Pulse:  115 112 112  Temp: 98.9 F (37.2 C)     TempSrc: Axillary     Resp:  21 33 20  Height:      Weight:      SpO2:  97% 99% 99%    Inpatient medications: . antiseptic oral rinse  7 mL Mouth Rinse BID  . budesonide (PULMICORT) nebulizer solution  0.5 mg Nebulization BID  . darbepoetin (ARANESP) injection - DIALYSIS  100 mcg Intravenous Q Mon-HD  . famotidine (PEPCID) IV  20 mg Intravenous Q12H  . feeding supplement (NEPRO CARB STEADY)  237 mL Oral BID BM  . feeding supplement (PRO-STAT SUGAR FREE 64)  30 mL Oral BID  . imipenem-cilastatin  500 mg Intravenous Q8H  . insulin aspart  0-15 Units Subcutaneous TID WC  . ipratropium-albuterol  3 mL Nebulization QID  . metoCLOPramide (REGLAN) injection  5 mg Intravenous Q8H  . midodrine  10 mg Oral TID WC  . multivitamin  1 tablet Oral QHS  . nystatin  5 mL Oral QID  . potassium & sodium phosphates  1 packet Oral BID WC  . sodium chloride flush  3 mL Intravenous Q12H  . Warfarin - Pharmacist Dosing Inpatient   Does not apply q1800   . dialysis replacement fluid (prismasate) 300 mL/hr at 08/12/15 0429  . dialysis replacement fluid (prismasate) 200 mL/hr at 08/11/15 2029  . dialysate (PRISMASATE) 1,500 mL/hr at 08/12/15 0319   fentaNYL (SUBLIMAZE) injection, heparin, HYDROcodone-acetaminophen, ipratropium-albuterol, ondansetron (ZOFRAN) IV, ondansetron **OR** [DISCONTINUED] ondansetron (ZOFRAN) IV, polyvinyl alcohol, simethicone, sodium chloride  Exam: Chron ill, tired, obese, deconditioned No jvd Chest occ basilar rales RRR no mrg Abd obese Ext 1-2+ pitting edema from hips to feet, improved Brawny skin chgs bilat lower legs NF, ox 3  CXR 7/7 shows improved pulm edema, mostly resolved  Dialysis: [facility] MWF  4h   109.5kg  3K/2.25 bath  Hep none   R IJ cath/ LUA AVF inserted 6/19 (maturing) Mircera 75 ug not started yet at OP unit tsat 22%, ferr 1958  Phos 3.7  pth 64      Assessment: 1.  Klebsiella sepsis/ UTI  2.  Vol overload/ pulm edema - CXR better, still 3rd spaced edema 3.  ESRD recent start 4.  Hypotension - may limit ability to do HD 5.  DNR  Plan - stopped CRRT. Plan regular HD tomorrow, see how he tolerates   Kelly Splinter MD [facility] pager [phone]    cell [phone] 08/12/2015, 11:08 AM    Recent Labs Lab 08/11/15 0530 08/11/15 1600 08/12/15 0400  NA 137 134* 136  K 4.3 4.2 4.3  CL 105 102 104  CO2 27 26 27   GLUCOSE 95 134* 112*  BUN 17 15 16   CREATININE 1.23 1.29* 1.34*  CALCIUM 8.0* 8.0* 7.9*  PHOS 2.5 2.0* 2.3*    Recent Labs Lab 08/07/15 1957  08/11/15 0530 08/11/15 1600 08/12/15 0400  AST 29  --   --   --   --   ALT 20  --   --   --   --   ALKPHOS 115  --   --   --   --   BILITOT 0.8  --   --   --   --  PROT 5.2*  --   --   --   --   ALBUMIN 1.5*  < > 1.7* 1.7* 1.8*  < > = values in this interval not displayed.  Recent Labs Lab 08/09/15 0415 08/11/15 0530 08/12/15 0400  WBC 4.8 5.2 4.5  NEUTROABS  --   --  3.2  HGB 8.2* 9.1* 9.0*  HCT 26.8* 30.8* 29.9*  MCV 89.3 92.2 91.4  PLT 300 294 225   Iron/TIBC/Ferritin/ %Sat    Component Value Date/Time   IRON 46 06/28/2015 0907   TIBC 272 06/28/2015 0907   FERRITIN 1421* 06/28/2015 0907   IRONPCTSAT 17* 06/28/2015 [id number]

## 2015-08-12 NOTE — Progress Notes (Signed)
[place] for warfarin Indication: h/o DVT/PE and afib  No Known Allergies  Patient Measurements: Height: 5\' 10"  (177.8 cm) Weight: 231 lb 7.7 oz (105 kg) IBW/kg (Calculated) : 73  Ht: 69.5 in  Wt: 109.6kg  IBW: 72 kg Heparin Dosing Weight: 96 kg  Vital Signs: Temp: 98.9 F (37.2 C) (07/07 0837) Temp Source: Axillary (07/07 0837) BP: 94/75 mmHg (07/07 1000) Pulse Rate: 112 (07/07 1000)  Labs:  Recent Labs  08/10/15 0615  08/11/15 0530 08/11/15 1600 08/12/15 0400  HGB  --   --  9.1*  --  9.0*  HCT  --   --  30.8*  --  29.9*  PLT  --   --  294  --  225  LABPROT 40.1*  --  32.0*  --  27.2*  INR 4.30*  --  3.18*  --  2.57*  CREATININE 1.45*  < > 1.23 1.29* 1.34*  < > = values in this interval not displayed.  Estimated Creatinine Clearance: 59.6 mL/min (by C-G formula based on Cr of 1.34).   Medical History: Past Medical History  Diagnosis Date  . Obesity   . Phlebitis     Lower extermity  . Pulmonary emboli ([place]) 2008    submassive, saddle  . Prostate cancer ([place]) 07/2009  . Sleep apnea     on CPAP  . Hx of echocardiogram 12/04/2010    Normal EF >55% no significant valve disease  . History of stress test 06/27/2009    Low risk and EF of approximately 50%  . DVT (deep venous thrombosis) ([place])   . Chronic kidney disease, stage 3     baseline creatinine ~1.4  . HLD (hyperlipidemia)   . HTN (hypertension)   . Dysrhythmia     A fib  . Diabetes mellitus ([place])     diet controlled  . History of hiatal hernia     Assessment: 75 y.o. M presents with. Pt on coumadin PTA at SNF for h/o DVT and afib - per SNF MAR, coumadin currently on hold since 6/28 due to elevated INR.   INR elevated upon admission to hospital (4.75), last dose of Coumadin PTA was 6/28.   INR has dropped to 2.57 and pt was restarted on warfarin 2.5mg  once last night. Will give another dose and monitor INR expecting it to continue to drop a little.  Goal  of Therapy:  INR 2-3 Monitor platelets by anticoagulation protocol: Yes   Plan:  Warfarin 2.5mg  tonight x1 Daily INR Monitor s/sx of bleeding  Andrey Cota. Diona Foley, PharmD, [place] Clinical Pharmacist Pager [phone]  08/12/2015 11:24 AM

## 2015-08-12 NOTE — Progress Notes (Signed)
PULMONARY / CRITICAL CARE MEDICINE   Name: [name] MRN: [id number] DOB: 1940/12/18    ADMISSION DATE:  08/07/2015 CONSULTATION DATE:  08/08/15  REFERRING MD:  Olevia Bowens [facility])  CHIEF COMPLAINT:  Weakness  HISTORY OF PRESENT ILLNESS:  Pt is encephelopathic; therefore, this HPI is obtained from chart review. [name] is a 75 y.o. male with PMH as outlined below. He had recent prolonged extensive admission 05/31/15 through 07/31/15 for multiple problems including but not limited to septic shock due to Klebsiella bacteremia  Pyelonephritis/UTI complicated by hemorrhagic shock due to coagulopathy with subsequent cardiac arrest, complicated by left pneumothorax following CPR s/p chest tube placement.  He was eventually discharged to SNF on 07/31/15; however, he returned to [facility] ED 08/07/15 with fatigue, somnolence, fever.  He was found to have UTI and was initially being admitted to SDU as he had responded well to 25m/kg bolus; however, he later developed hypotension along with significant third spacing.  He was started on albumin and due to persistent hypotension, PCCM was asked to admit to ICU for septic shock.  Pt's wife states that on day of presentation, pt also had a cough that was productive of foul smelling brown sputum.  He also complained of chest tightness while coughing.  Besides fever on day of presentation, no hx of fevers/chills/sweats, N/V/D, abd pain, myalgias.  No exposures to known sick contacts.  Of note, he has hx of ESBL Klebsiella UTI as well as Klebsiella bacteremia.  SUBJECTIVE:   cvvhd clotted BP awesome this am  No distress Less edema  VITAL SIGNS: BP 92/80 mmHg  Pulse 94  Temp(Src) 98.6 F (37 C) (Oral)  Resp 19  Ht _0  (1.778 m)  Wt 105 kg (231 lb 7.7 oz)  BMI 33.21 kg/m2  SpO2 100%  HEMODYNAMICS:    VENTILATOR SETTINGS:    INTAKE / OUTPUT: I/O last 3 completed shifts: In: 3074 [P.O.:2340; I.V.:30; IV [id number]:628]Out: 73151[Urine:69;  Emesis/NG output:10; Other:7538]   PHYSICAL EXAMINATION: General: Chronically ill appearing male, in NAD. Neuro: Awake, oriented x 4 HEENT: Box/AT. PERRL Cardiovascular: IRIR, no M Lungs: reduced to clear anterior Abdomen: Obese.  Abd soft, NT/ND.  Musculoskeletal: No gross deformities, edema improving daily to 1-2 plus legs Skin: Intact, warm, no rashes.  LABS:  BMET  Recent Labs Lab 08/11/15 0530 08/11/15 1600 08/12/15 0400  NA 137 134* 136  K 4.3 4.2 4.3  CL 105 102 104  CO2 _1 BUN _2 CREATININE 1.23 1.29* 1.34*  GLUCOSE 95 134* 112*    Electrolytes  Recent Labs Lab 08/10/15 0615  08/11/15 0530 08/11/15 1600 08/12/15 0400  CALCIUM 8.0*  < > 8.0* 8.0* 7.9*  MG 2.0  --  2.3  --  2.2  PHOS 2.2*  < > 2.5 2.0* 2.3*  < > = values in this interval not displayed.  CBC  Recent Labs Lab 08/09/15 0415 08/11/15 0530 08/12/15 0400  WBC 4.8 5.2 4.5  HGB 8.2* 9.1* 9.0*  HCT 26.8* 30.8* 29.9*  PLT 300 294 225    Coag's  Recent Labs Lab 08/10/15 0615 08/11/15 0530 08/12/15 0400  INR 4.30* 3.18* 2.57*    Sepsis Markers  Recent Labs Lab 08/07/15 2007 08/07/15 2308  LATICACIDVEN 2.01* 1.74    ABG  Recent Labs Lab 08/07/15 2026  PHART 7.519*  PCO2ART 30.2*  PO2ART 62.0*    Liver Enzymes  Recent Labs Lab 08/07/15 1957  08/11/15 0530 08/11/15 1600 08/12/15  0400  AST 29  --   --   --   --   ALT 20  --   --   --   --   ALKPHOS 115  --   --   --   --   BILITOT 0.8  --   --   --   --   ALBUMIN 1.5*  < > 1.7* 1.7* 1.8*  < > = values in this interval not displayed.  Cardiac Enzymes  Recent Labs Lab 08/07/15 1957 08/07/15 2255  TROPONINI 0.05* <0.03    Glucose  Recent Labs Lab 08/10/15 1538 08/10/15 2257 08/11/15 0759 08/11/15 1203 08/11/15 1600 08/11/15 2159  GLUCAP 168* 109* 101* 122* 136* 148*    Imaging Dg Abd Portable 1v  08/11/2015  CLINICAL DATA:  Vomiting.  Rule out ileus. EXAM: PORTABLE ABDOMEN - 1  VIEW COMPARISON:  06/13/2015 FINDINGS: No dilated loops of small or large bowel identified within the abdomen or pelvis. IMPRESSION: 1. Nonobstructive bowel gas pattern. Electronically Signed   By: Kerby Moors M.D.   On: 08/11/2015 11:10     STUDIES:  CXR 07/02 > cardiomegaly with mild congestion.  CULTURES: Blood 07/02 > Kleb pna, Coag (-) staph in 1st bottle; (-) in 2nd bottle Urine 07/02 > several species Sputum 07/02 > no specimen BC 7/4>>>  ANTIBIOTICS: Vanc 07/02 >>>7/6 Cefepime 07/02 >>>7/3 Imipenem 7/3>>>needs 14 days Anidulo 7/3>>>off  SIGNIFICANT EVENTS: 07/02 > admitted with septic shock due to UTI.  LINES/TUBES: None.  DISCUSSION: 75 y.o. M with recent prolonged hospital admission due to multiple issues including but not limited to septic shock due to Klebsiella bacteremia pyelonephritis/UTI complicated by hemorrhagic shock due to coagulopathy with subsequent cardiac arrest, complicated by left pneumothorax following CPR s/p chest tube placement. Pt also had DVT during prolonged hospitalization for which he was discharged on coumadin.  He is now being admitted with septic shock due to UTI.  ASSESSMENT / PLAN:  CARDIOVASCULAR A:  Septic shock - due to UTI, r/o line Hx A.fib (on warfarin) DVT, HLD, HTN. P:  MAp goal 55,met easily Pressors from here on out would  Not help, wold not offer or restart, pt agrees Dc tele dnr  INFECTIOUS A:   Septic shock - UTi source likely with pt history and organism R/o containation staph coag neg - highly likely R/o line Hd cath on admission - unlikely  P:   imipenem x 14 days Foley is new If he had declines would restart vanc  PULMONARY A: Acute on chronic hypoxic respiratory failure. Concern for HCAP. CXR with possible LLL infiltrate  Hx OSA - on CPAP. Hx PE (2008). P:   Continue nocturnal CPAP. pulmicort BID and duoneb qid. pcxr no sig edema, mild hilar  RENAL A:   ESRD  (M/W/F). Hyponatremia. Hypocalcemia. P:   cvvhd clotted Pt and wife uinderstand its time to transition to int HD and he may not tolerate Per renal Would NOT restart cvvhd , he has benefited greatly, max benefit done Chem in am   GASTROINTESTINAL A:   Fecal occult positive. Nutrition. N/V Gastroparesis likely  No obstruction P:   Diet KUB reviewed bm noted 7/56 Add scheduled reglan, qt low ppi to bid to iv zofran  HEMATOLOGIC A:   Warfarin induced coagulopathy. Anemia - chronic. VTE Prophylaxis. Hx hemorrhagic shock (duet o right perinephric hematoma s/p embolization) with subsequent cardiac arrest. P:  Consider restart coumadin  Once inr less 2.5 - today would be okay Transfuse for  Hgb < 7. SCD's only.  ENDOCRINE A:   DM.   No rel AI P:   SSI.  NEUROLOGIC A:   Acute encephalopathy likely related to sepsis. Clinically improved.  DNR P:   Monitor clinically. Re assess transition to int hd success, may need full comfort if fails  Family updated: I updated pt ans wife daily, today pt  Interdisciplinary Family Meeting v Palliative Care Meeting:  Due by: 08/14/15.  Lavon Paganini. Titus Mould, MD, [place] Pgr: [facility]

## 2015-08-13 LAB — RENAL FUNCTION PANEL
ALBUMIN: 1.7 g/dL — AB (ref 3.5–5.0)
ANION GAP: 7 (ref 5–15)
BUN: 34 mg/dL — AB (ref 6–20)
CALCIUM: 8.3 mg/dL — AB (ref 8.9–10.3)
CO2: 26 mmol/L (ref 22–32)
Chloride: 102 mmol/L (ref 101–111)
Creatinine, Ser: 2.56 mg/dL — ABNORMAL HIGH (ref 0.61–1.24)
GFR calc Af Amer: 27 mL/min — ABNORMAL LOW (ref >60)
GFR, EST NON AFRICAN AMERICAN: 23 mL/min — AB (ref >60)
GLUCOSE: 103 mg/dL — AB (ref 65–99)
PHOSPHORUS: 3.8 mg/dL (ref 2.5–4.6)
POTASSIUM: 4.7 mmol/L (ref 3.5–5.1)
SODIUM: 135 mmol/L (ref 135–145)

## 2015-08-13 LAB — CBC
HCT: 30.6 % — ABNORMAL LOW (ref 39.0–52.0)
HEMOGLOBIN: 9 g/dL — AB (ref 13.0–17.0)
MCH: 26.8 pg (ref 26.0–34.0)
MCHC: 29.4 g/dL — ABNORMAL LOW (ref 30.0–36.0)
MCV: 91.1 fL (ref 78.0–100.0)
PLATELETS: 244 10*3/uL (ref 150–400)
RBC: 3.36 MIL/uL — AB (ref 4.22–5.81)
RDW: 17.7 % — ABNORMAL HIGH (ref 11.5–15.5)
WBC: 6.8 10*3/uL (ref 4.0–10.5)

## 2015-08-13 LAB — GLUCOSE, CAPILLARY
GLUCOSE-CAPILLARY: 107 mg/dL — AB (ref 65–99)
GLUCOSE-CAPILLARY: 123 mg/dL — AB (ref 65–99)
Glucose-Capillary: 99 mg/dL (ref 65–99)
Glucose-Capillary: 151 mg/dL — ABNORMAL HIGH (ref 65–99)

## 2015-08-13 LAB — CULTURE, BLOOD (ROUTINE X 2): CULTURE: NO GROWTH

## 2015-08-13 LAB — PROTIME-INR
INR: 2.51 — ABNORMAL HIGH (ref 0.00–1.49)
PROTHROMBIN TIME: 26.8 s — AB (ref 11.6–15.2)

## 2015-08-13 MED ORDER — PENTAFLUOROPROP-TETRAFLUOROETH EX AERO: 1.0000 "application " | INHALATION_SPRAY | CUTANEOUS | Status: DC | PRN

## 2015-08-13 MED ORDER — SODIUM CHLORIDE 0.9 % IV SOLN: 100.0000 mL | INTRAVENOUS | Status: DC | PRN

## 2015-08-13 MED ORDER — LIDOCAINE-PRILOCAINE 2.5-2.5 % EX CREA: 1.0000 "application " | TOPICAL_CREAM | CUTANEOUS | Status: DC | PRN

## 2015-08-13 MED ORDER — HEPARIN SODIUM (PORCINE) 1000 UNIT/ML DIALYSIS: 1000.0000 [IU] | INTRAMUSCULAR | Status: DC | PRN

## 2015-08-13 MED ORDER — LIDOCAINE HCL (PF) 1 % IJ SOLN: 5.0000 mL | INTRAMUSCULAR | Status: DC | PRN

## 2015-08-13 MED ORDER — WARFARIN SODIUM 2.5 MG PO TABS
2.5000 mg | ORAL_TABLET | Freq: Once | ORAL | Status: AC
  Administered 2015-08-13: 2.5 mg via ORAL
  Filled 2015-08-13: qty 1

## 2015-08-13 MED ORDER — ALTEPLASE 2 MG IJ SOLR: 2.0000 mg | Freq: Once | INTRAMUSCULAR | Status: DC | PRN

## 2015-08-13 NOTE — Progress Notes (Signed)
[place] for warfarin Indication: h/o DVT/PE and afib  No Known Allergies  Patient Measurements: Height: 5\' 10"  (177.8 cm) Weight: 227 lb 1.2 oz (103 kg) (Bedside ) IBW/kg (Calculated) : 73  Ht: 69.5 in  Wt: 109.6kg  IBW: 72 kg Heparin Dosing Weight: 96 kg  Vital Signs: Temp: 97.8 F (36.6 C) (07/08 1700) Temp Source: Oral (07/08 1700) BP: 112/72 mmHg (07/08 1700) Pulse Rate: 94 (07/08 1700)  Labs:  Recent Labs  08/11/15 0530 08/11/15 1600 08/12/15 0400 08/13/15 0609 08/13/15 0815 08/13/15 0823  HGB 9.1*  --  9.0*  --   --  9.0*  HCT 30.8*  --  29.9*  --   --  30.6*  PLT 294  --  225  --   --  244  LABPROT 32.0*  --  27.2* 26.8*  --   --   INR 3.18*  --  2.57* 2.51*  --   --   CREATININE 1.23 1.29* 1.34*  --  2.56*  --     Estimated Creatinine Clearance: 30.9 mL/min (by C-G formula based on Cr of 2.56).   Medical History: Past Medical History  Diagnosis Date  . Obesity   . Phlebitis     Lower extermity  . Pulmonary emboli ([place]) 2008    submassive, saddle  . Prostate cancer ([place]) 07/2009  . Sleep apnea     on CPAP  . Hx of echocardiogram 12/04/2010    Normal EF >55% no significant valve disease  . History of stress test 06/27/2009    Low risk and EF of approximately 50%  . DVT (deep venous thrombosis) ([place])   . Chronic kidney disease, stage 3     baseline creatinine ~1.4  . HLD (hyperlipidemia)   . HTN (hypertension)   . Dysrhythmia     A fib  . Diabetes mellitus ([place])     diet controlled  . History of hiatal hernia     Assessment: 75 y.o. M presents with. Pt on coumadin PTA at SNF for h/o DVT and afib - per SNF MAR, coumadin currently on hold since 6/28 due to elevated INR.   INR elevated upon admission to hospital (4.75), last dose of Coumadin PTA was 6/28.   INR has dropped to 2.51 and pt was restarted on warfarin 2.5mg  once last night. Will give another dose and monitor INR  Goal of Therapy:  INR  2-3 Monitor platelets by anticoagulation protocol: Yes   Plan:  Warfarin 2.5mg  tonight x1 Daily INR Monitor s/sx of bleeding  Bonnita Nasuti Pharm.D. CPP, BCPS Clinical Pharmacist [phone] 08/13/2015 5:19 PM

## 2015-08-13 NOTE — Progress Notes (Signed)
[facility] Progress Note  Assessment/Plan: 1. Klebsiella sepsis/ UTI - on imipenem   2. Vol overload/ pulm edema - CXR better, still 3rd spaced edemas/p VDRF- previous CRRT, now on HD today 3. ESRD recent start  TTS [facility] 4. Hypotension/volume - may limit ability to do HD- goal today 3 L BP drop shortly after HD is initiated - keep systolic BP > 80; lower temp to 36 degrees; sats good 5. 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 Nutrition alb 1.9 - liberalize diet to carb mod - to help with P support/intake - K not an issue at present; on renavits/prostat 8. Chronic coumadin - afib hx DVT - per pharm 9. DNR - palliative care seeing  Myriam Jacobson, PA-C [place] [phone] 08/13/2015,8:46 AM  LOS: 6 days   Pt seen, examined and agree w A/P as above.  Kelly Splinter MD [facility] pager [phone]    cell [phone] 08/13/2015, 12:29 PM    Subjective:   Denies SOB.  Tells me this whole situation has been awful.  Objective Filed Vitals:   08/13/15 0757 08/13/15 0807 08/13/15 0808 08/13/15 0825  BP: 132/93 110/82 110/82 106/74  Pulse: 83 82 82 85  Temp: 97.9 F (36.6 C)     TempSrc: Oral     Resp: 18     Height:      Weight: 104.7 kg (230 lb 13.2 oz)     SpO2: 97%      Physical Exam General: pale supine on HD NAD Heart: RRR Lungs: grossly clear anteriorly Abdomen: obese soft Extremities: 1 + LE edema; dependent edema buttocks/thighs Dialysis Access:  Left AVF maturing right IJ cath atQb 400  Dialysis Orders: [facility] MWF 4h 109.5kg 3K/2.25 bath Hep none R IJ cath/ LUA AVF inserted 6/19 (maturing) Mircera 75 ug not started yet at OP unit tsat 22%, ferr 1958 Phos 3.7 pth 64  Additional Objective Labs: Basic Metabolic Panel:  Recent Labs Lab 08/11/15 0530 08/11/15 1600 08/12/15 0400  NA 137 134* 136  K  4.3 4.2 4.3  CL 105 102 104  CO2 27 26 27   GLUCOSE 95 134* 112*  BUN 17 15 16   CREATININE 1.23 1.29* 1.34*  CALCIUM 8.0* 8.0* 7.9*  PHOS 2.5 2.0* 2.3*   Liver Function Tests:  Recent Labs Lab 08/07/15 1957  08/11/15 0530 08/11/15 1600 08/12/15 0400  AST 29  --   --   --   --   ALT 20  --   --   --   --   ALKPHOS 115  --   --   --   --   BILITOT 0.8  --   --   --   --   PROT 5.2*  --   --   --   --   ALBUMIN 1.5*  < > 1.7* 1.7* 1.8*  < > = values in this interval not displayed. No results for input(s): LIPASE, AMYLASE in the last 168 hours. CBC:  Recent Labs Lab 08/07/15 2255  08/09/15 0415 08/11/15 0530 08/12/15 0400 08/13/15 0823  WBC 6.7  --  4.8 5.2 4.5 6.8  NEUTROABS  --   --   --   --  3.2  --   HGB 8.8*  < > 8.2* 9.1* 9.0* 9.0*  HCT 28.6*  < >  26.8* 30.8* 29.9* 30.6*  MCV 89.9  --  89.3 92.2 91.4 91.1  PLT 297  --  300 294 225 244  < > = values in this interval not displayed. Blood Culture    Component Value Date/Time   SDES BLOOD RIGHT ANTECUBITAL 08/09/2015 1432   SPECREQUEST BOTTLES DRAWN AEROBIC ONLY 5CC 08/09/2015 1432   CULT NO GROWTH 2 DAYS 08/09/2015 1432   REPTSTATUS PENDING 08/09/2015 1432    Cardiac Enzymes:  Recent Labs Lab 08/07/15 1957 08/07/15 2255  TROPONINI 0.05* <0.03   CBG:  Recent Labs Lab 08/12/15 0834 08/12/15 1151 08/12/15 1718 08/12/15 2119 08/13/15 0731  GLUCAP 126* 144* 111* 128* 107*    Studies/Results: Dg Chest Port 1 View  08/12/2015  CLINICAL DATA:  Pulmonary edema. EXAM: PORTABLE CHEST 1 VIEW COMPARISON:  08/08/2015.  07/06/2015. FINDINGS: Right IJ line in stable position . Stable cardiomegaly. Persistent left base atelectasis and or infiltrate. Small left pleural effusion cannot be excluded. No pneumothorax. Old left posterior lateral fourth rib fracture. IMPRESSION: 1. Right IJ line in stable position. 2. Persistent left base mild atelectasis and or infiltrate. Small left pleural effusion . 3. Stable  cardiomegaly. 4. Old left posterior lateral fourth rib fracture. No pneumothorax . Electronically Signed   By: Marcello Moores  Register   On: 08/12/2015 06:56   Dg Abd Portable 1v  08/11/2015  CLINICAL DATA:  Vomiting.  Rule out ileus. EXAM: PORTABLE ABDOMEN - 1 VIEW COMPARISON:  06/13/2015 FINDINGS: No dilated loops of small or large bowel identified within the abdomen or pelvis. IMPRESSION: 1. Nonobstructive bowel gas pattern. Electronically Signed   By: Kerby Moors M.D.   On: 08/11/2015 11:10   Medications:   . antiseptic oral rinse  7 mL Mouth Rinse BID  . budesonide (PULMICORT) nebulizer solution  0.5 mg Nebulization BID  . darbepoetin (ARANESP) injection - DIALYSIS  100 mcg Intravenous Q Mon-HD  . famotidine (PEPCID) IV  20 mg Intravenous Q24H  . feeding supplement (NEPRO CARB STEADY)  237 mL Oral BID BM  . feeding supplement (PRO-STAT SUGAR FREE 64)  30 mL Oral BID  . imipenem-cilastatin  250 mg Intravenous Q12H  . insulin aspart  0-15 Units Subcutaneous TID WC  . ipratropium-albuterol  3 mL Nebulization BID  . LORazepam  0.5 mg Oral Once  . metoCLOPramide (REGLAN) injection  5 mg Intravenous Q8H  . midodrine  10 mg Oral TID WC  . multivitamin  1 tablet Oral QHS  . nystatin  5 mL Oral QID  . potassium & sodium phosphates  1 packet Oral BID WC  . sodium chloride flush  3 mL Intravenous Q12H  . Warfarin - Pharmacist Dosing Inpatient   Does not apply [phone]

## 2015-08-13 NOTE — Progress Notes (Signed)
Patient ID: [name], male   DOB: 14-Mar-1940, 75 y.o.   MRN: [id number]    PROGRESS NOTE    [name]  MWN:[id number] DOB: 18-Jan-1941 DOA: 08/07/2015  PCP: Melinda Crutch   Brief Narrative:  75 y.o. male with recent prolonged extensive admission 05/31/15 through 07/31/15 for multiple problems including but not limited to septic shock due to Klebsiella bacteremia Pyelonephritis/UTI complicated by hemorrhagic shock due to coagulopathy with subsequent cardiac arrest, complicated by left pneumothorax following CPR s/p chest tube placement.  He was eventually discharged to SNF on 07/31/15; however, he returned to [facility] ED 08/07/15 with fatigue, somnolence, fever. He was found to have UTI and was initially admitted to SDU as he had responded well to 66m/kg bolus; however, he later developed hypotension along with significant third spacing. He was started on albumin and due to persistent hypotension, PCCM was asked to admit to ICU for septic shock.  Assessment & Plan:  Septic shock - due to UTI Hx A.fib (on warfarin) DVT, HLD, HTN. - MAP goal 55,met - no further invasive interventions per pt's request   Septic shock - UTi source likely with pt history and organism R/o containation staph coag neg - highly likely R/o line Hd cath on admission - unlikely - imipenem x 14 days - if pt declines, may need restart vanc  Acute on chronic hypoxic respiratory failure. Concern for HCAP. CXR with possible LLL infiltrate  Hx OSA - on CPAP. Hx PE (2008). - Continue nocturnal CPAP. - pulmicort BID and duoneb qid. - maintaining oxygen saturations at target range   ESRD (M/W/F) Hyponatremia. Hypocalcemia. - cvvhd clotted - Would NOT restart cvvhd , he has benefited greatly, max benefit done - BMP in AM  Fecal occult positive. N/V, Gastroparesis likely  No obstruction - had BM - Added scheduled reglan, qt low - continue PPI  - continue zofran  Warfarin induced coagulopathy. Anemia -  chronic. VTE Prophylaxis. Hx hemorrhagic shock (due to right perinephric hematoma s/p embolization) with subsequent cardiac arrest - restarted Coumadin  - Transfuse for Hgb < 7.  Acute encephalopathy likely related to sepsis. Clinically improved.  DNR - mental status clear this AM   DVT prophylaxis: DNR Code Status: DNR Family Communication: Patient at bedside  Disposition Plan: ? SNF in few days   Consultants:   Nephrology   Procedures:   None   Antimicrobials:   Blood 07/02 > Kleb pna, Coag (-) staph in 1st bottle; (-) in 2nd bottle  Urine 07/02 > several species  Sputum 07/02 > no specimen  BC 7/4>>>  ANTIBIOTICS:  Vanc 07/02 >>>7/6  Cefepime 07/02 >>>7/3  Imipenem 7/3>>>needs 14 days  Anidulo 7/3>>>off   Subjective: No events overnight.   Objective: Filed Vitals:   08/13/15 1129 08/13/15 1200 08/13/15 1211 08/13/15 1700  BP: 90/72 92/69 110/76 112/72  Pulse: 102 93 92 94  Temp:   97 F (36.1 C) 97.8 F (36.6 C)  TempSrc:   Oral Oral  Resp:   18 18  Height:      Weight:   103 kg (227 lb 1.2 oz)   SpO2:   97% 97%    Intake/Output Summary (Last 24 hours) at 08/13/15 1942 Last data filed at 08/13/15 1807  Gross per 24 hour  Intake    893 ml  Output   2397 ml  Net  -1504 ml   Filed Weights   08/12/15 2125 08/13/15 0757 08/13/15 1211  Weight: 103.2 kg (227 lb 8.2 oz)  104.7 kg (230 lb 13.2 oz) 103 kg (227 lb 1.2 oz)    Examination:  General exam: Appears calm and comfortable  Respiratory system: Respiratory effort normal. Cardiovascular system: S1 & S2 heard, RRR. No JVD, rubs, gallops or clicks. No pedal edema. Gastrointestinal system: Abdomen is nondistended, soft and nontender. No organomegaly or masses felt.  Central nervous system: Alert and oriented. No focal neurological deficits. Extremities: chronic bilateral LE venous stasis changes, + 1 edema     Data Reviewed: I have personally reviewed following labs and imaging  studies  CBC:  Recent Labs Lab 08/07/15 2255 08/08/15 1933 08/09/15 0415 08/11/15 0530 08/12/15 0400 08/13/15 0823  WBC 6.7  --  4.8 5.2 4.5 6.8  NEUTROABS  --   --   --   --  3.2  --   HGB 8.8* 8.5* 8.2* 9.1* 9.0* 9.0*  HCT 28.6* 25.0* 26.8* 30.8* 29.9* 30.6*  MCV 89.9  --  89.3 92.2 91.4 91.1  PLT 297  --  300 294 225 742   Basic Metabolic Panel:  Recent Labs Lab 08/09/15 0415  08/10/15 0615 08/10/15 1300 08/11/15 0530 08/11/15 1600 08/12/15 0400 08/13/15 0815  NA 137  < > 136 136 137 134* 136 135  K 3.9  < > 4.0 4.3 4.3 4.2 4.3 4.7  CL 105  < > 103 103 105 102 104 102  CO2 25  < > _0 GLUCOSE 108*  < > 78 166* 95 134* 112* 103*  BUN 38*  < > _1 34*  CREATININE 2.32*  < > 1.45* 1.39* 1.23 1.29* 1.34* 2.56*  CALCIUM 7.8*  < > 8.0* 7.9* 8.0* 8.0* 7.9* 8.3*  MG 1.9  --  2.0  --  2.3  --  2.2  --   PHOS 3.4  < > 2.2* 1.9* 2.5 2.0* 2.3* 3.8  < > = values in this interval not displayed.  Coagulation Profile:  Recent Labs Lab 08/09/15 0415 08/10/15 0615 08/11/15 0530 08/12/15 0400 08/13/15 0609  INR 5.75* 4.30* 3.18* 2.57* 2.51*   Cardiac Enzymes:  Recent Labs Lab 08/07/15 1957 08/07/15 2255  TROPONINI 0.05* <0.03   CBG:  Recent Labs Lab 08/12/15 1718 08/12/15 2119 08/13/15 0731 08/13/15 1243 08/13/15 1655  GLUCAP 111* 128* 107* 99 123*   Urine analysis:    Component Value Date/Time   COLORURINE YELLOW 08/07/2015 2139   APPEARANCEUR TURBID* 08/07/2015 2139   LABSPEC 1.019 08/07/2015 2139   PHURINE 8.0 08/07/2015 2139   GLUCOSEU NEGATIVE 08/07/2015 2139   HGBUR LARGE* 08/07/2015 2139   BILIRUBINUR SMALL* 08/07/2015 2139   [place] NEGATIVE 08/07/2015 2139   PROTEINUR 100* 08/07/2015 2139   UROBILINOGEN 0.2 03/06/2013 1748   NITRITE NEGATIVE 08/07/2015 2139   LEUKOCYTESUR LARGE* 08/07/2015 2139    Recent Results (from the past 240 hour(s))  Urine culture     Status: Abnormal   Collection Time: 08/07/15   8:12 PM  Result Value Ref Range Status   Specimen Description URINE, RANDOM  Final   Special Requests NONE  Final   Culture MULTIPLE SPECIES PRESENT, SUGGEST RECOLLECTION (A)  Final   Report Status 08/08/2015 FINAL  Final  Gram stain     Status: None   Collection Time: 08/07/15  8:12 PM  Result Value Ref Range Status   Specimen Description URINE, RANDOM  Final   Special Requests NONE  Final   Gram Stain   Final    WBC  PRESENT, PREDOMINANTLY MONONUCLEAR GRAM NEGATIVE RODS BUDDING YEAST SEEN GRAM POSITIVE COCCI IN CHAINS CYTOSPIN SMEAR    Report Status 08/07/2015 FINAL  Final  Blood Culture (routine x 2)     Status: Abnormal   Collection Time: 08/07/15  8:18 PM  Result Value Ref Range Status   Specimen Description BLOOD BLOOD RIGHT FOREARM  Final   Special Requests BOTTLES DRAWN AEROBIC AND ANAEROBIC 5CC  Final   Culture  Setup Time   Final    GRAM NEGATIVE RODS ANAEROBIC BOTTLE ONLY Organism ID to follow CRITICAL RESULT CALLED TO, READ BACK BY AND VERIFIED WITH: N BATCHELDER 08/08/15 @ [place] IN [facility] AEROBIC BOTTLE ONLY CRITICAL RESULT CALLED TO, READ BACK BY AND VERIFIED WITH: L POWELL PHARMD 1751 08/08/15 A BROWNING    Culture (A)  Final    KLEBSIELLA PNEUMONIAE Confirmed Extended Spectrum Beta-Lactamase Producer (ESBL) STAPHYLOCOCCUS SPECIES (COAGULASE NEGATIVE) THE SIGNIFICANCE OF ISOLATING THIS ORGANISM FROM A SINGLE SET OF BLOOD CULTURES WHEN MULTIPLE SETS ARE DRAWN IS UNCERTAIN. PLEASE NOTIFY THE MICROBIOLOGY DEPARTMENT WITHIN ONE WEEK IF SPECIATION AND SENSITIVITIES ARE REQUIRED.    Report Status 08/11/2015 FINAL  Final   Organism ID, Bacteria KLEBSIELLA PNEUMONIAE  Final      Susceptibility   Klebsiella pneumoniae - MIC*    AMPICILLIN >=32 RESISTANT Resistant     CEFAZOLIN >=64 RESISTANT Resistant     CEFEPIME <=1 RESISTANT Resistant     CEFTAZIDIME 2 RESISTANT Resistant     CEFTRIAXONE 8 RESISTANT Resistant     CIPROFLOXACIN 1  SENSITIVE Sensitive     GENTAMICIN 8 INTERMEDIATE Intermediate     IMIPENEM <=0.25 SENSITIVE Sensitive     TRIMETH/SULFA <=20 SENSITIVE Sensitive     AMPICILLIN/SULBACTAM 8 SENSITIVE Sensitive     PIP/TAZO 8 SENSITIVE Sensitive     * KLEBSIELLA PNEUMONIAE  Blood Culture ID Panel (Reflexed)     Status: Abnormal   Collection Time: 08/07/15  8:18 PM  Result Value Ref Range Status   Enterococcus species NOT DETECTED NOT DETECTED Final   Vancomycin resistance NOT DETECTED NOT DETECTED Final   Listeria monocytogenes NOT DETECTED NOT DETECTED Final   Staphylococcus species DETECTED (A) NOT DETECTED Final    Comment: CRITICAL RESULT CALLED TO, READ BACK BY AND VERIFIED WITH: N BATCHELDER 08/08/15 @ 1249 M VESTAL    Staphylococcus aureus NOT DETECTED NOT DETECTED Final   Methicillin resistance DETECTED (A) NOT DETECTED Final    Comment: CRITICAL RESULT CALLED TO, READ BACK BY AND VERIFIED WITH: N BATCHELDER 08/08/15 @ 1249 M VESTAL    Streptococcus species NOT DETECTED NOT DETECTED Final   Streptococcus agalactiae NOT DETECTED NOT DETECTED Final   Streptococcus pneumoniae NOT DETECTED NOT DETECTED Final   Streptococcus pyogenes NOT DETECTED NOT DETECTED Final   Acinetobacter baumannii NOT DETECTED NOT DETECTED Final   Enterobacteriaceae species DETECTED (A) NOT DETECTED Final    Comment: CRITICAL RESULT CALLED TO, READ BACK BY AND VERIFIED WITH: N BATCHELDER 08/08/15 @ 1249 M VESTAL    Enterobacter cloacae complex NOT DETECTED NOT DETECTED Final   Escherichia coli NOT DETECTED NOT DETECTED Final   Klebsiella oxytoca NOT DETECTED NOT DETECTED Final   Klebsiella pneumoniae DETECTED (A) NOT DETECTED Final    Comment: CRITICAL RESULT CALLED TO, READ BACK BY AND VERIFIED WITH: N BATCHELDER 08/08/15 @ 1249 M VESTAL3    Proteus species NOT DETECTED NOT DETECTED Final   Serratia marcescens NOT DETECTED NOT DETECTED Final   Carbapenem resistance NOT  DETECTED NOT DETECTED Final   Haemophilus influenzae  NOT DETECTED NOT DETECTED Final   Neisseria meningitidis NOT DETECTED NOT DETECTED Final   Pseudomonas aeruginosa NOT DETECTED NOT DETECTED Final   Candida albicans NOT DETECTED NOT DETECTED Final   Candida glabrata NOT DETECTED NOT DETECTED Final   Candida krusei NOT DETECTED NOT DETECTED Final   Candida parapsilosis NOT DETECTED NOT DETECTED Final   Candida tropicalis NOT DETECTED NOT DETECTED Final  Blood Culture (routine x 2)     Status: None   Collection Time: 08/07/15  8:33 PM  Result Value Ref Range Status   Specimen Description BLOOD RIGHT ARM  Final   Special Requests BOTTLES DRAWN AEROBIC AND ANAEROBIC 5CC  Final   Culture NO GROWTH 6 DAYS  Final   Report Status 08/13/2015 FINAL  Final  Culture, blood (routine x 2)     Status: None (Preliminary result)   Collection Time: 08/09/15  2:25 PM  Result Value Ref Range Status   Specimen Description BLOOD RIGHT HAND  Final   Special Requests IN PEDIATRIC BOTTLE .5CC  Final   Culture NO GROWTH 4 DAYS  Final   Report Status PENDING  Incomplete  Culture, blood (routine x 2)     Status: None (Preliminary result)   Collection Time: 08/09/15  2:32 PM  Result Value Ref Range Status   Specimen Description BLOOD RIGHT ANTECUBITAL  Final   Special Requests BOTTLES DRAWN AEROBIC ONLY 5CC  Final   Culture NO GROWTH 4 DAYS  Final   Report Status PENDING  Incomplete      Radiology Studies: Dg Chest Port 1 View  08/12/2015  CLINICAL DATA:  Pulmonary edema. EXAM: PORTABLE CHEST 1 VIEW COMPARISON:  08/08/2015.  07/06/2015. FINDINGS: Right IJ line in stable position . Stable cardiomegaly. Persistent left base atelectasis and or infiltrate. Small left pleural effusion cannot be excluded. No pneumothorax. Old left posterior lateral fourth rib fracture. IMPRESSION: 1. Right IJ line in stable position. 2. Persistent left base mild atelectasis and or infiltrate. Small left pleural effusion . 3. Stable cardiomegaly. 4. Old left posterior lateral fourth  rib fracture. No pneumothorax . Electronically Signed   By: Marcello Moores  Register   On: 08/12/2015 06:56      Scheduled Meds: . antiseptic oral rinse  7 mL Mouth Rinse BID  . budesonide (PULMICORT) nebulizer solution  0.5 mg Nebulization BID  . darbepoetin (ARANESP) injection - DIALYSIS  100 mcg Intravenous Q Mon-HD  . famotidine (PEPCID) IV  20 mg Intravenous Q24H  . feeding supplement (NEPRO CARB STEADY)  237 mL Oral BID BM  . feeding supplement (PRO-STAT SUGAR FREE 64)  30 mL Oral BID  . imipenem-cilastatin  250 mg Intravenous Q12H  . insulin aspart  0-15 Units Subcutaneous TID WC  . ipratropium-albuterol  3 mL Nebulization BID  . LORazepam  0.5 mg Oral Once  . metoCLOPramide (REGLAN) injection  5 mg Intravenous Q8H  . midodrine  10 mg Oral TID WC  . multivitamin  1 tablet Oral QHS  . nystatin  5 mL Oral QID  . sodium chloride flush  3 mL Intravenous Q12H  . Warfarin - Pharmacist Dosing Inpatient   Does not apply q1800   Continuous Infusions:    LOS: 6 days   Time spent: 20 minutes   Faye Ramsay, MD [facility] Pager [phone]  If 7PM-7AM, please contact night-coverage [email or web address] Password [facility] 08/13/2015, 7:42 PM

## 2015-08-13 NOTE — Progress Notes (Signed)
RT placed patient on CPAP HS. NO O2 bleed in needed. Patient tolerating well.  

## 2015-08-14 LAB — RENAL FUNCTION PANEL
ALBUMIN: 1.6 g/dL — AB (ref 3.5–5.0)
Anion gap: 9 (ref 5–15)
BUN: 30 mg/dL — AB (ref 6–20)
CALCIUM: 7.7 mg/dL — AB (ref 8.9–10.3)
CHLORIDE: 100 mmol/L — AB (ref 101–111)
CO2: 23 mmol/L (ref 22–32)
CREATININE: 2.48 mg/dL — AB (ref 0.61–1.24)
GFR, EST AFRICAN AMERICAN: 28 mL/min — AB (ref >60)
GFR, EST NON AFRICAN AMERICAN: 24 mL/min — AB (ref >60)
Glucose, Bld: 155 mg/dL — ABNORMAL HIGH (ref 65–99)
Phosphorus: 2.8 mg/dL (ref 2.5–4.6)
Potassium: 4.1 mmol/L (ref 3.5–5.1)
SODIUM: 132 mmol/L — AB (ref 135–145)

## 2015-08-14 LAB — CBC
HCT: 30 % — ABNORMAL LOW (ref 39.0–52.0)
Hemoglobin: 9.1 g/dL — ABNORMAL LOW (ref 13.0–17.0)
MCH: 27.5 pg (ref 26.0–34.0)
MCHC: 30.3 g/dL (ref 30.0–36.0)
MCV: 90.6 fL (ref 78.0–100.0)
PLATELETS: 235 10*3/uL (ref 150–400)
RBC: 3.31 MIL/uL — AB (ref 4.22–5.81)
RDW: 17.9 % — AB (ref 11.5–15.5)
WBC: 7 10*3/uL (ref 4.0–10.5)

## 2015-08-14 LAB — CULTURE, BLOOD (ROUTINE X 2)
CULTURE: NO GROWTH
Culture: NO GROWTH

## 2015-08-14 LAB — PROTIME-INR
INR: 2.76 — AB (ref 0.00–1.49)
PROTHROMBIN TIME: 28.7 s — AB (ref 11.6–15.2)

## 2015-08-14 LAB — GLUCOSE, CAPILLARY
GLUCOSE-CAPILLARY: 105 mg/dL — AB (ref 65–99)
GLUCOSE-CAPILLARY: 141 mg/dL — AB (ref 65–99)
GLUCOSE-CAPILLARY: 154 mg/dL — AB (ref 65–99)

## 2015-08-14 MED ORDER — WARFARIN SODIUM 2.5 MG PO TABS
2.5000 mg | ORAL_TABLET | Freq: Every day | ORAL | Status: DC
  Administered 2015-08-14 – 2015-08-15 (×2): 2.5 mg via ORAL
  Filled 2015-08-14 (×2): qty 1

## 2015-08-14 NOTE — Progress Notes (Addendum)
[facility] Progress Note  Assessment: 1. Klebsiella sepsis/ UTI - on imipenem   2. Vol overload/ pulm edema - CXR better, still sig 3rd spaced edema 3. ESRD - started HD in hospital during 1st week of last admit 4/26> 6/24, dc'd w OP HD MWF [place] 4. Hypotension/vol^ - BP's may limit ability to do HD 5. 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 Nutrition alb 1.9 - liberalize diet to carb mod - to help with P support/intake - K not an issue at present; on renavits/prostat 8. Chronic coumadin - afib hx DVT, INR 2.6 9. DNR - palliative care seeing  Plan - return to MWF schedule, HD tomorrow as tolerated.  Do HD in the chair. Lower vol as tol slowly.    Kelly Splinter MD [facility] pager [phone]    cell [phone] 08/14/2015, 5:36 PM    Subjective:   No c/o, up in chair via [place].    Objective Filed Vitals:   08/13/15 2201 08/13/15 2250 08/14/15 0413 08/14/15 0858  BP: 95/69  115/73 104/68  Pulse: 86 88 86 82  Temp: 98.6 F (37 C)  98.4 F (36.9 C) 98.6 F (37 C)  TempSrc: Oral  Oral Oral  Resp: 16 16 16 16   Height:      Weight: 103.2 kg (227 lb 8.2 oz)     SpO2: 99% 98% 98% 98%   Physical Exam General: pale supine on HD NAD Heart: RRR Lungs: grossly clear anteriorly Abdomen: obese soft Extremities: diffuse 1-2+ LE edema, dependent edema buttocks/thighs Dialysis Access:  Left AVF maturing, right IJ cath  Dialysis Orders: [place] MWF 4h 109.5kg 3K/2.25 bath Hep none R IJ cath/ LUA AVF inserted 6/19 (maturing) Mircera 75 ug not started yet at OP unit tsat 22%, ferr 1958 Phos 3.7 pth 64  Additional Objective Labs: Basic Metabolic Panel:  Recent Labs Lab 08/12/15 0400 08/13/15 0815 08/14/15 1458  NA 136 135 132*  K 4.3 4.7 4.1  CL 104 102 100*  CO2 27 26 23   GLUCOSE 112* 103* 155*  BUN 16 34* 30*  CREATININE 1.34*  2.56* 2.48*  CALCIUM 7.9* 8.3* 7.7*  PHOS 2.3* 3.8 2.8   Liver Function Tests:  Recent Labs Lab 08/07/15 1957  08/12/15 0400 08/13/15 0815 08/14/15 1458  AST 29  --   --   --   --   ALT 20  --   --   --   --   ALKPHOS 115  --   --   --   --   BILITOT 0.8  --   --   --   --   PROT 5.2*  --   --   --   --   ALBUMIN 1.5*  < > 1.8* 1.7* 1.6*  < > = values in this interval not displayed. No results for input(s): LIPASE, AMYLASE in the last 168 hours. CBC:  Recent Labs Lab 08/09/15 0415 08/11/15 0530 08/12/15 0400 08/13/15 0823 08/14/15 1458  WBC 4.8 5.2 4.5 6.8 7.0  NEUTROABS  --   --  3.2  --   --   HGB 8.2* 9.1* 9.0* 9.0* 9.1*  HCT 26.8* 30.8* 29.9* 30.6* 30.0*  MCV 89.3 92.2 91.4 91.1 90.6  PLT 300 294 225 244 235   Blood Culture    Component Value Date/Time  SDES BLOOD RIGHT ANTECUBITAL 08/09/2015 1432   SPECREQUEST BOTTLES DRAWN AEROBIC ONLY 5CC 08/09/2015 1432   CULT NO GROWTH 5 DAYS 08/09/2015 1432   REPTSTATUS 08/14/2015 FINAL 08/09/2015 1432    Cardiac Enzymes:  Recent Labs Lab 08/07/15 1957 08/07/15 2255  TROPONINI 0.05* <0.03   CBG:  Recent Labs Lab 08/13/15 1243 08/13/15 1655 08/13/15 2117 08/14/15 0739 08/14/15 1306  GLUCAP 99 123* 151* 105* 141*    Studies/Results: No results found. Medications:   . antiseptic oral rinse  7 mL Mouth Rinse BID  . budesonide (PULMICORT) nebulizer solution  0.5 mg Nebulization BID  . darbepoetin (ARANESP) injection - DIALYSIS  100 mcg Intravenous Q Mon-HD  . famotidine (PEPCID) IV  20 mg Intravenous Q24H  . feeding supplement (NEPRO CARB STEADY)  237 mL Oral BID BM  . feeding supplement (PRO-STAT SUGAR FREE 64)  30 mL Oral BID  . imipenem-cilastatin  250 mg Intravenous Q12H  . insulin aspart  0-15 Units Subcutaneous TID WC  . ipratropium-albuterol  3 mL Nebulization BID  . metoCLOPramide (REGLAN) injection  5 mg Intravenous Q8H  . midodrine  10 mg Oral TID WC  . multivitamin  1 tablet Oral QHS  .  nystatin  5 mL Oral QID  . sodium chloride flush  3 mL Intravenous Q12H  . warfarin  2.5 mg Oral q1800  . Warfarin - Pharmacist Dosing Inpatient   Does not apply [phone]

## 2015-08-14 NOTE — Progress Notes (Signed)
Placed patient on CPAP for the night via auto-mode with minimum pressure set at 6cm and maximum pressure set at 20cm  

## 2015-08-14 NOTE — Progress Notes (Signed)
[place] for warfarin Indication: h/o DVT/PE and afib  No Known Allergies  Patient Measurements: Height: 5\' 10"  (177.8 cm) Weight: 227 lb 8.2 oz (103.2 kg) IBW/kg (Calculated) : 73  Ht: 69.5 in  Wt: 109.6kg  IBW: 72 kg Heparin Dosing Weight: 96 kg  Vital Signs: Temp: 98.6 F (37 C) (07/09 0858) Temp Source: Oral (07/09 0858) BP: 104/68 mmHg (07/09 0858) Pulse Rate: 82 (07/09 0858)  Labs:  Recent Labs  08/12/15 0400 08/13/15 0609 08/13/15 0815 08/13/15 0823 08/14/15 1458  HGB 9.0*  --   --  9.0* 9.1*  HCT 29.9*  --   --  30.6* 30.0*  PLT 225  --   --  244 235  LABPROT 27.2* 26.8*  --   --  28.7*  INR 2.57* 2.51*  --   --  2.76*  CREATININE 1.34*  --  2.56*  --  2.48*    Estimated Creatinine Clearance: 31.9 mL/min (by C-G formula based on Cr of 2.48).   Medical History: Past Medical History  Diagnosis Date  . Obesity   . Phlebitis     Lower extermity  . Pulmonary emboli ([place]) 2008    submassive, saddle  . Prostate cancer ([place]) 07/2009  . Sleep apnea     on CPAP  . Hx of echocardiogram 12/04/2010    Normal EF >55% no significant valve disease  . History of stress test 06/27/2009    Low risk and EF of approximately 50%  . DVT (deep venous thrombosis) ([place])   . Chronic kidney disease, stage 3     baseline creatinine ~1.4  . HLD (hyperlipidemia)   . HTN (hypertension)   . Dysrhythmia     A fib  . Diabetes mellitus ([place])     diet controlled  . History of hiatal hernia     Assessment: 75 y.o. M presents with. Pt on coumadin PTA at SNF for h/o DVT and afib - per SNF MAR, coumadin currently on hold since 6/28 due to elevated INR.   INR elevated upon admission to hospital (4.75), last dose of Coumadin PTA was 6/28.   INR stable 2.7 on warfarin 2.5mg  will continue. CBC stable  Goal of Therapy:  INR 2-3 Monitor platelets by anticoagulation protocol: Yes   Plan:  Warfarin 2.5mg  daily Daily INR Monitor s/sx of  bleeding  Bonnita Nasuti Pharm.D. CPP, BCPS Clinical Pharmacist [phone] 08/14/2015 4:22 PM

## 2015-08-14 NOTE — Progress Notes (Signed)
Patient ID: [name], male   DOB: [birth year], 75 y.o.   MRN: [id number]    PROGRESS NOTE    [name]  [name] DOB: 02/09/1940 DOA: 08/07/2015  PCP: Melinda Crutch   Brief Narrative:  75 y.o. male with recent prolonged extensive admission 05/31/15 through 07/31/15 for multiple problems including but not limited to septic shock due to Klebsiella bacteremia Pyelonephritis/UTI complicated by hemorrhagic shock due to coagulopathy with subsequent cardiac arrest, complicated by left pneumothorax following CPR s/p chest tube placement.  He was eventually discharged to SNF on 07/31/15; however, he returned to [facility] ED 08/07/15 with fatigue, somnolence, fever. He was found to have UTI and was initially admitted to SDU as he had responded well to 48ml/kg bolus; however, he later developed hypotension along with significant third spacing. He was started on albumin and due to persistent hypotension, PCCM was asked to admit to ICU for septic shock.  Assessment & Plan:  Septic shock - due to Klebsiella UTI - on Imipenem, continue for total 14 days - resolved  Hx A.fib (on warfarin) DVT - continue Coumadin per pharmacy  - no further invasive interventions per pt's request   Acute on chronic hypoxic respiratory failure, pt with hx of OSA on CPAP, hx of PE - Concern for HCAP. CXR with possible LLL infiltrate  - Continue nocturnal CPAP. - pulmicort BID and duoneb qid. - maintaining oxygen saturations at target range   ESRD  - per nephrology   Anemia of chronic disease, ESRD - no signs of bleeding - CBC in AM  Hx hemorrhagic shock (due to right perinephric hematoma s/p embolization) with subsequent cardiac arrest - restarted Coumadin  - Transfuse for Hgb < 7  Acute encephalopathy likely related to sepsis - Clinically improved  DNR - mental status clear this AM   DVT prophylaxis: on coumadin  Code Status: DNR Family Communication: Patient at bedside  Disposition Plan: ? SNF  in few days   Consultants:   Nephrology   Procedures:   None   Antimicrobials:   Blood 07/02 > Kleb pna, Coag (-) staph in 1st bottle; (-) in 2nd bottle  Urine 07/02 > several species  Sputum 07/02 > no specimen  BC 7/4>>>  ANTIBIOTICS:  Vanc 07/02 >>>7/6  Cefepime 07/02 >>>7/3  Imipenem 7/3>>>needs 14 days  Anidulo 7/3>>>off   Subjective: No events overnight.   Objective: Filed Vitals:   08/13/15 2006 08/13/15 2201 08/13/15 2250 08/14/15 0413  BP:  95/69  115/73  Pulse: 102 86 88 86  Temp:  98.6 F (37 C)  98.4 F (36.9 C)  TempSrc:  Oral  Oral  Resp: 18 16 16 16   Height:      Weight:  103.2 kg (227 lb 8.2 oz)    SpO2: 97% 99% 98% 98%    Intake/Output Summary (Last 24 hours) at 08/14/15 0726 Last data filed at 08/14/15 0550  Gross per 24 hour  Intake    733 ml  Output   1747 ml  Net  -1014 ml   Filed Weights   08/13/15 0757 08/13/15 1211 08/13/15 2201  Weight: 104.7 kg (230 lb 13.2 oz) 103 kg (227 lb 1.2 oz) 103.2 kg (227 lb 8.2 oz)    Examination:  General exam: Appears calm and comfortable  Respiratory system: Respiratory effort normal. Cardiovascular system: S1 & S2 heard, RRR. No JVD, rubs, gallops or clicks. No pedal edema. Gastrointestinal system: Abdomen is nondistended, soft and nontender. No organomegaly or masses felt.  Central  nervous system: Alert and oriented. No focal neurological deficits. Extremities: chronic bilateral LE venous stasis changes, + 1 edema     Data Reviewed: I have personally reviewed following labs and imaging studies  CBC:  Recent Labs Lab 08/07/15 2255 08/08/15 1933 08/09/15 0415 08/11/15 0530 08/12/15 0400 08/13/15 0823  WBC 6.7  --  4.8 5.2 4.5 6.8  NEUTROABS  --   --   --   --  3.2  --   HGB 8.8* 8.5* 8.2* 9.1* 9.0* 9.0*  HCT 28.6* 25.0* 26.8* 30.8* 29.9* 30.6*  MCV 89.9  --  89.3 92.2 91.4 91.1  PLT 297  --  300 294 225 [id number]   Basic Metabolic Panel:  Recent Labs Lab 08/09/15 0415   08/10/15 0615 08/10/15 1300 08/11/15 0530 08/11/15 1600 08/12/15 0400 08/13/15 0815  NA 137  < > 136 136 137 134* 136 135  K 3.9  < > 4.0 4.3 4.3 4.2 4.3 4.7  CL 105  < > 103 103 105 102 104 102  CO2 25  < > 26 26 27 26 27 26   GLUCOSE 108*  < > 78 166* 95 134* 112* 103*  BUN 38*  < > 20 20 17 15 16  34*  CREATININE 2.32*  < > 1.45* 1.39* 1.23 1.29* 1.34* 2.56*  CALCIUM 7.8*  < > 8.0* 7.9* 8.0* 8.0* 7.9* 8.3*  MG 1.9  --  2.0  --  2.3  --  2.2  --   PHOS 3.4  < > 2.2* 1.9* 2.5 2.0* 2.3* 3.8  < > = values in this interval not displayed.  Coagulation Profile:  Recent Labs Lab 08/09/15 0415 08/10/15 0615 08/11/15 0530 08/12/15 0400 08/13/15 0609  INR 5.75* 4.30* 3.18* 2.57* 2.51*   Cardiac Enzymes:  Recent Labs Lab 08/07/15 1957 08/07/15 2255  TROPONINI 0.05* <0.03   CBG:  Recent Labs Lab 08/12/15 2119 08/13/15 0731 08/13/15 1243 08/13/15 1655 08/13/15 2117  GLUCAP 128* 107* 99 123* 151*   Urine analysis:    Component Value Date/Time   COLORURINE YELLOW 08/07/2015 2139   APPEARANCEUR TURBID* 08/07/2015 2139   LABSPEC 1.019 08/07/2015 2139   PHURINE 8.0 08/07/2015 2139   GLUCOSEU NEGATIVE 08/07/2015 2139   HGBUR LARGE* 08/07/2015 2139   BILIRUBINUR SMALL* 08/07/2015 2139   [place] NEGATIVE 08/07/2015 2139   PROTEINUR 100* 08/07/2015 2139   UROBILINOGEN 0.2 03/06/2013 1748   NITRITE NEGATIVE 08/07/2015 2139   LEUKOCYTESUR LARGE* 08/07/2015 2139    Recent Results (from the past 240 hour(s))  Urine culture     Status: Abnormal   Collection Time: 08/07/15  8:12 PM  Result Value Ref [place] Status   Specimen Description URINE, RANDOM  Final   Special Requests NONE  Final   Culture MULTIPLE SPECIES PRESENT, SUGGEST RECOLLECTION (A)  Final   Report Status 08/08/2015 FINAL  Final  Gram stain     Status: None   Collection Time: 08/07/15  8:12 PM  Result Value Ref Range Status   Specimen Description URINE, RANDOM  Final   Special Requests NONE  Final    Gram Stain   Final    WBC PRESENT, PREDOMINANTLY MONONUCLEAR GRAM NEGATIVE RODS BUDDING YEAST SEEN GRAM POSITIVE COCCI IN CHAINS CYTOSPIN SMEAR    Report Status 08/07/2015 FINAL  Final  Blood Culture (routine x 2)     Status: Abnormal   Collection Time: 08/07/15  8:18 PM  Result Value Ref Range Status   Specimen Description BLOOD BLOOD RIGHT FOREARM  Final   Special Requests BOTTLES DRAWN AEROBIC AND ANAEROBIC 5CC  Final   Culture  Setup Time   Final    GRAM NEGATIVE RODS ANAEROBIC BOTTLE ONLY Organism ID to follow CRITICAL RESULT CALLED TO, READ BACK BY AND VERIFIED WITH: N BATCHELDER 08/08/15 @ [place] IN [place] AEROBIC BOTTLE ONLY CRITICAL RESULT CALLED TO, READ BACK BY AND VERIFIED WITH: L POWELL PHARMD 1751 08/08/15 A BROWNING    Culture (A)  Final    KLEBSIELLA PNEUMONIAE Confirmed Extended Spectrum Beta-Lactamase Producer (ESBL) STAPHYLOCOCCUS SPECIES (COAGULASE NEGATIVE) THE SIGNIFICANCE OF ISOLATING THIS ORGANISM FROM A SINGLE SET OF BLOOD CULTURES WHEN MULTIPLE SETS ARE DRAWN IS UNCERTAIN. PLEASE NOTIFY THE MICROBIOLOGY DEPARTMENT WITHIN ONE WEEK IF SPECIATION AND SENSITIVITIES ARE REQUIRED.    Report Status 08/11/2015 FINAL  Final   Organism ID, Bacteria KLEBSIELLA PNEUMONIAE  Final      Susceptibility   Klebsiella pneumoniae - MIC*    AMPICILLIN >=32 RESISTANT Resistant     CEFAZOLIN >=64 RESISTANT Resistant     CEFEPIME <=1 RESISTANT Resistant     CEFTAZIDIME 2 RESISTANT Resistant     CEFTRIAXONE 8 RESISTANT Resistant     CIPROFLOXACIN 1 SENSITIVE Sensitive     GENTAMICIN 8 INTERMEDIATE Intermediate     IMIPENEM <=0.25 SENSITIVE Sensitive     TRIMETH/SULFA <=20 SENSITIVE Sensitive     AMPICILLIN/SULBACTAM 8 SENSITIVE Sensitive     PIP/TAZO 8 SENSITIVE Sensitive     * KLEBSIELLA PNEUMONIAE  Blood Culture ID Panel (Reflexed)     Status: Abnormal   Collection Time: 08/07/15  8:18 PM  Result Value Ref Range Status   Enterococcus species  NOT DETECTED NOT DETECTED Final   Vancomycin resistance NOT DETECTED NOT DETECTED Final   Listeria monocytogenes NOT DETECTED NOT DETECTED Final   Staphylococcus species DETECTED (A) NOT DETECTED Final    Comment: CRITICAL RESULT CALLED TO, READ BACK BY AND VERIFIED WITH: N BATCHELDER 08/08/15 @ 1249 M VESTAL    Staphylococcus aureus NOT DETECTED NOT DETECTED Final   Methicillin resistance DETECTED (A) NOT DETECTED Final    Comment: CRITICAL RESULT CALLED TO, READ BACK BY AND VERIFIED WITH: N BATCHELDER 08/08/15 @ 1249 M VESTAL    Streptococcus species NOT DETECTED NOT DETECTED Final   Streptococcus agalactiae NOT DETECTED NOT DETECTED Final   Streptococcus pneumoniae NOT DETECTED NOT DETECTED Final   Streptococcus pyogenes NOT DETECTED NOT DETECTED Final   Acinetobacter baumannii NOT DETECTED NOT DETECTED Final   Enterobacteriaceae species DETECTED (A) NOT DETECTED Final    Comment: CRITICAL RESULT CALLED TO, READ BACK BY AND VERIFIED WITH: N BATCHELDER 08/08/15 @ 1249 M VESTAL    Enterobacter cloacae complex NOT DETECTED NOT DETECTED Final   Escherichia coli NOT DETECTED NOT DETECTED Final   Klebsiella oxytoca NOT DETECTED NOT DETECTED Final   Klebsiella pneumoniae DETECTED (A) NOT DETECTED Final    Comment: CRITICAL RESULT CALLED TO, READ BACK BY AND VERIFIED WITH: N BATCHELDER 08/08/15 @ 1249 M VESTAL3    Proteus species NOT DETECTED NOT DETECTED Final   Serratia marcescens NOT DETECTED NOT DETECTED Final   Carbapenem resistance NOT DETECTED NOT DETECTED Final   Haemophilus influenzae NOT DETECTED NOT DETECTED Final   Neisseria meningitidis NOT DETECTED NOT DETECTED Final   Pseudomonas aeruginosa NOT DETECTED NOT DETECTED Final   Candida albicans NOT DETECTED NOT DETECTED Final   Candida glabrata NOT DETECTED NOT DETECTED Final   Candida krusei NOT DETECTED NOT DETECTED Final  Candida parapsilosis NOT DETECTED NOT DETECTED Final   Candida tropicalis NOT DETECTED NOT DETECTED Final    Blood Culture (routine x 2)     Status: None   Collection Time: 08/07/15  8:33 PM  Result Value Ref Range Status   Specimen Description BLOOD RIGHT ARM  Final   Special Requests BOTTLES DRAWN AEROBIC AND ANAEROBIC 5CC  Final   Culture NO GROWTH 6 DAYS  Final   Report Status 08/13/2015 FINAL  Final  Culture, blood (routine x 2)     Status: None (Preliminary result)   Collection Time: 08/09/15  2:25 PM  Result Value Ref [place] Status   Specimen Description BLOOD RIGHT HAND  Final   Special Requests IN PEDIATRIC BOTTLE .5CC  Final   Culture NO GROWTH 4 DAYS  Final   Report Status PENDING  Incomplete  Culture, blood (routine x 2)     Status: None (Preliminary result)   Collection Time: 08/09/15  2:32 PM  Result Value Ref [place] Status   Specimen Description BLOOD RIGHT ANTECUBITAL  Final   Special Requests BOTTLES DRAWN AEROBIC ONLY 5CC  Final   Culture NO GROWTH 4 DAYS  Final   Report Status PENDING  Incomplete      Radiology Studies: No results found.    Scheduled Meds: . antiseptic oral rinse  7 mL Mouth Rinse BID  . budesonide (PULMICORT) nebulizer solution  0.5 mg Nebulization BID  . darbepoetin (ARANESP) injection - DIALYSIS  100 mcg Intravenous Q Mon-HD  . famotidine (PEPCID) IV  20 mg Intravenous Q24H  . feeding supplement (NEPRO CARB STEADY)  237 mL Oral BID BM  . feeding supplement (PRO-STAT SUGAR FREE 64)  30 mL Oral BID  . imipenem-cilastatin  250 mg Intravenous Q12H  . insulin aspart  0-15 Units Subcutaneous TID WC  . ipratropium-albuterol  3 mL Nebulization BID  . metoCLOPramide (REGLAN) injection  5 mg Intravenous Q8H  . midodrine  10 mg Oral TID WC  . multivitamin  1 tablet Oral QHS  . nystatin  5 mL Oral QID  . sodium chloride flush  3 mL Intravenous Q12H  . Warfarin - Pharmacist Dosing Inpatient   Does not apply q1800   Continuous Infusions:    LOS: 7 days   Time spent: 20 minutes   Raymond Ramsay, MD [facility] Pager [phone]  If  7PM-7AM, please contact night-coverage [email or web address] Password [email or web address] 08/14/2015, 7:26 AM

## 2015-08-15 LAB — PROTIME-INR
INR: 2.79 — AB (ref 0.00–1.49)
PROTHROMBIN TIME: 29 s — AB (ref 11.6–15.2)

## 2015-08-15 LAB — GLUCOSE, CAPILLARY
GLUCOSE-CAPILLARY: 80 mg/dL (ref 65–99)
GLUCOSE-CAPILLARY: 165 mg/dL — AB (ref 65–99)
Glucose-Capillary: 108 mg/dL — ABNORMAL HIGH (ref 65–99)
Glucose-Capillary: 114 mg/dL — ABNORMAL HIGH (ref 65–99)
Glucose-Capillary: 194 mg/dL — ABNORMAL HIGH (ref 65–99)

## 2015-08-15 LAB — CBC
HCT: 30.2 % — ABNORMAL LOW (ref 39.0–52.0)
Hemoglobin: 8.9 g/dL — ABNORMAL LOW (ref 13.0–17.0)
MCH: 26.5 pg (ref 26.0–34.0)
MCHC: 29.5 g/dL — ABNORMAL LOW (ref 30.0–36.0)
MCV: 89.9 fL (ref 78.0–100.0)
PLATELETS: 250 10*3/uL (ref 150–400)
RBC: 3.36 MIL/uL — AB (ref 4.22–5.81)
RDW: 17.8 % — AB (ref 11.5–15.5)
WBC: 6 10*3/uL (ref 4.0–10.5)

## 2015-08-15 LAB — RENAL FUNCTION PANEL
ALBUMIN: 1.7 g/dL — AB (ref 3.5–5.0)
Anion gap: 9 (ref 5–15)
BUN: 40 mg/dL — ABNORMAL HIGH (ref 6–20)
CHLORIDE: 99 mmol/L — AB (ref 101–111)
CO2: 25 mmol/L (ref 22–32)
Calcium: 8 mg/dL — ABNORMAL LOW (ref 8.9–10.3)
Creatinine, Ser: 2.89 mg/dL — ABNORMAL HIGH (ref 0.61–1.24)
GFR, EST AFRICAN AMERICAN: 23 mL/min — AB (ref >60)
GFR, EST NON AFRICAN AMERICAN: 20 mL/min — AB (ref >60)
GLUCOSE: 108 mg/dL — AB (ref 65–99)
PHOSPHORUS: 3.7 mg/dL (ref 2.5–4.6)
Potassium: 4 mmol/L (ref 3.5–5.1)
SODIUM: 133 mmol/L — AB (ref 135–145)

## 2015-08-15 MED ORDER — FAMOTIDINE 20 MG PO TABS
20.0000 mg | ORAL_TABLET | Freq: Every day | ORAL | Status: DC
  Administered 2015-08-15 – 2015-08-23 (×9): 20 mg via ORAL
  Filled 2015-08-15 (×10): qty 1

## 2015-08-15 MED ORDER — METOCLOPRAMIDE HCL 5 MG PO TABS
5.0000 mg | ORAL_TABLET | Freq: Three times a day (TID) | ORAL | Status: DC
  Administered 2015-08-15 – 2015-08-23 (×22): 5 mg via ORAL
  Filled 2015-08-15 (×27): qty 1

## 2015-08-15 MED ORDER — DARBEPOETIN ALFA 100 MCG/0.5ML IJ SOSY
PREFILLED_SYRINGE | INTRAMUSCULAR | Status: AC
  Filled 2015-08-15: qty 0.5

## 2015-08-15 MED ORDER — SODIUM CHLORIDE 0.9 % IV SOLN: 100.0000 mL | INTRAVENOUS | Status: DC | PRN

## 2015-08-15 MED ORDER — PENTAFLUOROPROP-TETRAFLUOROETH EX AERO: 1.0000 "application " | INHALATION_SPRAY | CUTANEOUS | Status: DC | PRN

## 2015-08-15 MED ORDER — LIDOCAINE HCL (PF) 1 % IJ SOLN: 5.0000 mL | INTRAMUSCULAR | Status: DC | PRN

## 2015-08-15 MED ORDER — HEPARIN SODIUM (PORCINE) 1000 UNIT/ML DIALYSIS: 1000.0000 [IU] | INTRAMUSCULAR | Status: DC | PRN

## 2015-08-15 MED ORDER — ALTEPLASE 2 MG IJ SOLR: 2.0000 mg | Freq: Once | INTRAMUSCULAR | Status: DC | PRN

## 2015-08-15 MED ORDER — LIDOCAINE-PRILOCAINE 2.5-2.5 % EX CREA: 1.0000 "application " | TOPICAL_CREAM | CUTANEOUS | Status: DC | PRN

## 2015-08-15 NOTE — Progress Notes (Signed)
Patient ID: [name], male   DOB: 03/17/1940, 75 y.o.   MRN: [id number]    PROGRESS NOTE    [name]  [name] DOB: Mar 23, 1940 DOA: 08/07/2015  PCP: Melinda Crutch   Brief Narrative:  75 y.o. male with recent prolonged extensive admission 05/31/15 through 07/31/15 for multiple problems including but not limited to septic shock due to Klebsiella bacteremia Pyelonephritis/UTI complicated by hemorrhagic shock due to coagulopathy with subsequent cardiac arrest, complicated by left pneumothorax following CPR s/p chest tube placement.  He was eventually discharged to SNF on 07/31/15; however, he returned to [facility] ED 08/07/15 with fatigue, somnolence, fever. He was found to have UTI and was initially admitted to SDU as he had responded well to 25ml/kg bolus; however, he later developed hypotension along with significant third spacing. He was started on albumin and due to persistent hypotension, PCCM was asked to admit to ICU for septic shock.  Assessment & Plan:  Septic shock - due to Klebsiella UTI - on Imipenem, continue for total 14 days - resolved  Hx A.fib (on warfarin) DVT - continue Coumadin per pharmacy  - no further invasive interventions per pt's request  - d/c telemetry   Acute on chronic hypoxic respiratory failure, pt with hx of OSA on CPAP, hx of PE - Concern for HCAP. CXR with possible LLL infiltrate  - Continue nocturnal CPAP. - pulmicort BID and duoneb qid. - maintaining oxygen saturations at target range   ESRD  - per nephrology   Anemia of chronic disease, ESRD - no signs of bleeding, Hg overall stable  - CBC in AM  Hx hemorrhagic shock (due to right perinephric hematoma s/p embolization) with subsequent cardiac arrest - restarted Coumadin, INR therapeutic  - Transfuse for Hgb < 7  Acute encephalopathy likely related to sepsis - Clinically improved  DNR - mental status clear this AM   DVT prophylaxis: on coumadin  Code Status: DNR Family  Communication: Patient at bedside  Disposition Plan: ? SNF iin 1-2 days   Consultants:   Nephrology   Procedures:   None   Antimicrobials:   Blood 07/02 > Kleb pna, Coag (-) staph in 1st bottle; (-) in 2nd bottle  Urine 07/02 > several species  Sputum 07/02 > no specimen  BC 7/4>>>  ANTIBIOTICS:  Vanc 07/02 >>>7/6  Cefepime 07/02 >>>7/3  Imipenem 7/3>>>needs 14 days  Anidulo 7/3>>>off   Subjective: No events overnight.   Objective: Filed Vitals:   08/15/15 1700 08/15/15 1930 08/15/15 1931 08/15/15 1950  BP: 95/64   106/70  Pulse: 88   93  Temp: 98.8 F (37.1 C)   99 F (37.2 C)  TempSrc: Oral   Oral  Resp: 18   18  Height:      Weight:    103.193 kg (227 lb 8 oz)  SpO2: 97% 97% 99% 100%    Intake/Output Summary (Last 24 hours) at 08/15/15 2005 Last data filed at 08/15/15 1953  Gross per 24 hour  Intake    580 ml  Output   1165 ml  Net   -585 ml   Filed Weights   08/15/15 1252 08/15/15 1950  Weight: 102.558 kg (226 lb 1.6 oz) 103.193 kg (227 lb 8 oz)    Examination:  General exam: Appears calm and comfortable  Respiratory system: Respiratory effort normal. Cardiovascular system: S1 & S2 heard, RRR. No JVD, rubs, gallops or clicks. No pedal edema. Gastrointestinal system: Abdomen is nondistended, soft and nontender. No organomegaly or masses  felt.  Central nervous system: Alert and oriented. No focal neurological deficits. Extremities: chronic bilateral LE venous stasis changes, + 1 edema     Data Reviewed: I have personally reviewed following labs and imaging studies  CBC:  Recent Labs Lab 08/11/15 0530 08/12/15 0400 08/13/15 0823 08/14/15 1458 08/15/15 0500  WBC 5.2 4.5 6.8 7.0 6.0  NEUTROABS  --  3.2  --   --   --   HGB 9.1* 9.0* 9.0* 9.1* 8.9*  HCT 30.8* 29.9* 30.6* 30.0* 30.2*  MCV 92.2 91.4 91.1 90.6 89.9  PLT 294 225 244 235 [id number]   Basic Metabolic Panel:  Recent Labs Lab 08/09/15 0415  08/10/15 0615  08/11/15 0530  08/11/15 1600 08/12/15 0400 08/13/15 0815 08/14/15 1458 08/15/15 0500  NA 137  < > 136  < > 137 134* 136 135 132* 133*  K 3.9  < > 4.0  < > 4.3 4.2 4.3 4.7 4.1 4.0  CL 105  < > 103  < > 105 102 104 102 100* 99*  CO2 25  < > 26  < > 27 26 27 26 23 25   GLUCOSE 108*  < > 78  < > 95 134* 112* 103* 155* 108*  BUN 38*  < > 20  < > 17 15 16  34* 30* 40*  CREATININE 2.32*  < > 1.45*  < > 1.23 1.29* 1.34* 2.56* 2.48* 2.89*  CALCIUM 7.8*  < > 8.0*  < > 8.0* 8.0* 7.9* 8.3* 7.7* 8.0*  MG 1.9  --  2.0  --  2.3  --  2.2  --   --   --   PHOS 3.4  < > 2.2*  < > 2.5 2.0* 2.3* 3.8 2.8 3.7  < > = values in this interval not displayed.  Coagulation Profile:  Recent Labs Lab 08/11/15 0530 08/12/15 0400 08/13/15 0609 08/14/15 1458 08/15/15 0500  INR 3.18* 2.57* 2.51* 2.76* 2.79*   Cardiac Enzymes: No results for input(s): CKTOTAL, CKMB, CKMBINDEX, TROPONINI in the last 168 hours. CBG:  Recent Labs Lab 08/14/15 1648 08/14/15 2022 08/15/15 0739 08/15/15 1249 08/15/15 1657  GLUCAP 108* 154* 114* 80 194*   Urine analysis:    Component Value Date/Time   COLORURINE YELLOW 08/07/2015 2139   APPEARANCEUR TURBID* 08/07/2015 2139   LABSPEC 1.019 08/07/2015 2139   PHURINE 8.0 08/07/2015 2139   GLUCOSEU NEGATIVE 08/07/2015 2139   HGBUR LARGE* 08/07/2015 2139   BILIRUBINUR SMALL* 08/07/2015 2139   [place] NEGATIVE 08/07/2015 2139   PROTEINUR 100* 08/07/2015 2139   UROBILINOGEN 0.2 03/06/2013 1748   NITRITE NEGATIVE 08/07/2015 2139   LEUKOCYTESUR LARGE* 08/07/2015 2139    Recent Results (from the past 240 hour(s))  Urine culture     Status: Abnormal   Collection Time: 08/07/15  8:12 PM  Result Value Ref Range Status   Specimen Description URINE, RANDOM  Final   Special Requests NONE  Final   Culture MULTIPLE SPECIES PRESENT, SUGGEST RECOLLECTION (A)  Final   Report Status 08/08/2015 FINAL  Final  Gram stain     Status: None   Collection Time: 08/07/15  8:12 PM  Result Value Ref  Range Status   Specimen Description URINE, RANDOM  Final   Special Requests NONE  Final   Gram Stain   Final    WBC PRESENT, PREDOMINANTLY MONONUCLEAR GRAM NEGATIVE RODS BUDDING YEAST SEEN GRAM POSITIVE COCCI IN CHAINS CYTOSPIN SMEAR    Report Status 08/07/2015 FINAL  Final  Blood Culture (routine  x 2)     Status: Abnormal   Collection Time: 08/07/15  8:18 PM  Result Value Ref Range Status   Specimen Description BLOOD BLOOD RIGHT FOREARM  Final   Special Requests BOTTLES DRAWN AEROBIC AND ANAEROBIC 5CC  Final   Culture  Setup Time   Final    GRAM NEGATIVE RODS ANAEROBIC BOTTLE ONLY Organism ID to follow CRITICAL RESULT CALLED TO, READ BACK BY AND VERIFIED WITH: N BATCHELDER 08/08/15 @ [place] IN [place] AEROBIC BOTTLE ONLY CRITICAL RESULT CALLED TO, READ BACK BY AND VERIFIED WITH: L POWELL PHARMD 1751 08/08/15 A BROWNING    Culture (A)  Final    KLEBSIELLA PNEUMONIAE Confirmed Extended Spectrum Beta-Lactamase Producer (ESBL) STAPHYLOCOCCUS SPECIES (COAGULASE NEGATIVE) THE SIGNIFICANCE OF ISOLATING THIS ORGANISM FROM A SINGLE SET OF BLOOD CULTURES WHEN MULTIPLE SETS ARE DRAWN IS UNCERTAIN. PLEASE NOTIFY THE MICROBIOLOGY DEPARTMENT WITHIN ONE WEEK IF SPECIATION AND SENSITIVITIES ARE REQUIRED.    Report Status 08/11/2015 FINAL  Final   Organism ID, Bacteria KLEBSIELLA PNEUMONIAE  Final      Susceptibility   Klebsiella pneumoniae - MIC*    AMPICILLIN >=32 RESISTANT Resistant     CEFAZOLIN >=64 RESISTANT Resistant     CEFEPIME <=1 RESISTANT Resistant     CEFTAZIDIME 2 RESISTANT Resistant     CEFTRIAXONE 8 RESISTANT Resistant     CIPROFLOXACIN 1 SENSITIVE Sensitive     GENTAMICIN 8 INTERMEDIATE Intermediate     IMIPENEM <=0.25 SENSITIVE Sensitive     TRIMETH/SULFA <=20 SENSITIVE Sensitive     AMPICILLIN/SULBACTAM 8 SENSITIVE Sensitive     PIP/TAZO 8 SENSITIVE Sensitive     * KLEBSIELLA PNEUMONIAE  Blood Culture ID Panel (Reflexed)     Status: Abnormal     Collection Time: 08/07/15  8:18 PM  Result Value Ref Range Status   Enterococcus species NOT DETECTED NOT DETECTED Final   Vancomycin resistance NOT DETECTED NOT DETECTED Final   Listeria monocytogenes NOT DETECTED NOT DETECTED Final   Staphylococcus species DETECTED (A) NOT DETECTED Final    Comment: CRITICAL RESULT CALLED TO, READ BACK BY AND VERIFIED WITH: N BATCHELDER 08/08/15 @ 1249 M VESTAL    Staphylococcus aureus NOT DETECTED NOT DETECTED Final   Methicillin resistance DETECTED (A) NOT DETECTED Final    Comment: CRITICAL RESULT CALLED TO, READ BACK BY AND VERIFIED WITH: N BATCHELDER 08/08/15 @ 1249 M VESTAL    Streptococcus species NOT DETECTED NOT DETECTED Final   Streptococcus agalactiae NOT DETECTED NOT DETECTED Final   Streptococcus pneumoniae NOT DETECTED NOT DETECTED Final   Streptococcus pyogenes NOT DETECTED NOT DETECTED Final   Acinetobacter baumannii NOT DETECTED NOT DETECTED Final   Enterobacteriaceae species DETECTED (A) NOT DETECTED Final    Comment: CRITICAL RESULT CALLED TO, READ BACK BY AND VERIFIED WITH: N BATCHELDER 08/08/15 @ 1249 M VESTAL    Enterobacter cloacae complex NOT DETECTED NOT DETECTED Final   Escherichia coli NOT DETECTED NOT DETECTED Final   Klebsiella oxytoca NOT DETECTED NOT DETECTED Final   Klebsiella pneumoniae DETECTED (A) NOT DETECTED Final    Comment: CRITICAL RESULT CALLED TO, READ BACK BY AND VERIFIED WITH: N BATCHELDER 08/08/15 @ 1249 M VESTAL3    Proteus species NOT DETECTED NOT DETECTED Final   Serratia marcescens NOT DETECTED NOT DETECTED Final   Carbapenem resistance NOT DETECTED NOT DETECTED Final   Haemophilus influenzae NOT DETECTED NOT DETECTED Final   Neisseria meningitidis NOT DETECTED NOT DETECTED Final   Pseudomonas aeruginosa NOT DETECTED  NOT DETECTED Final   Candida albicans NOT DETECTED NOT DETECTED Final   Candida glabrata NOT DETECTED NOT DETECTED Final   Candida krusei NOT DETECTED NOT DETECTED Final   Candida  parapsilosis NOT DETECTED NOT DETECTED Final   Candida tropicalis NOT DETECTED NOT DETECTED Final  Blood Culture (routine x 2)     Status: None   Collection Time: 08/07/15  8:33 PM  Result Value Ref Range Status   Specimen Description BLOOD RIGHT ARM  Final   Special Requests BOTTLES DRAWN AEROBIC AND ANAEROBIC 5CC  Final   Culture NO GROWTH 6 DAYS  Final   Report Status 08/13/2015 FINAL  Final  Culture, blood (routine x 2)     Status: None   Collection Time: 08/09/15  2:25 PM  Result Value Ref Range Status   Specimen Description BLOOD RIGHT HAND  Final   Special Requests IN PEDIATRIC BOTTLE .5CC  Final   Culture NO GROWTH 5 DAYS  Final   Report Status 08/14/2015 FINAL  Final  Culture, blood (routine x 2)     Status: None   Collection Time: 08/09/15  2:32 PM  Result Value Ref Range Status   Specimen Description BLOOD RIGHT ANTECUBITAL  Final   Special Requests BOTTLES DRAWN AEROBIC ONLY 5CC  Final   Culture NO GROWTH 5 DAYS  Final   Report Status 08/14/2015 FINAL  Final      Radiology Studies: No results found.    Scheduled Meds: . antiseptic oral rinse  7 mL Mouth Rinse BID  . budesonide (PULMICORT) nebulizer solution  0.5 mg Nebulization BID  . Darbepoetin Alfa      . darbepoetin (ARANESP) injection - DIALYSIS  100 mcg Intravenous Q Mon-HD  . famotidine  20 mg Oral Q1200  . feeding supplement (NEPRO CARB STEADY)  237 mL Oral BID BM  . feeding supplement (PRO-STAT SUGAR FREE 64)  30 mL Oral BID  . imipenem-cilastatin  250 mg Intravenous Q12H  . insulin aspart  0-15 Units Subcutaneous TID WC  . ipratropium-albuterol  3 mL Nebulization BID  . metoCLOPramide  5 mg Oral TID AC  . midodrine  10 mg Oral TID WC  . multivitamin  1 tablet Oral QHS  . nystatin  5 mL Oral QID  . sodium chloride flush  3 mL Intravenous Q12H  . warfarin  2.5 mg Oral q1800  . Warfarin - Pharmacist Dosing Inpatient   Does not apply q1800   Continuous Infusions:    LOS: 8 days   Time spent: 20  minutes   Faye Ramsay, MD [facility] Pager [phone]  If 7PM-7AM, please contact night-coverage [email or web address] Password [email or web address] 08/15/2015, 8:05 PM

## 2015-08-15 NOTE — Progress Notes (Signed)
I stopped by to check in on [name] today. His wife is at bedside.  He reports feeling, "pretty well overall." States that he thinks dialysis in working on his volume which has been helpful in reducing his pain and other discomfort.  He is due for dialysis tomorrow. Denies any needs at this time.  Will continue to follow.   Please let us know if there are any specific areas in which we can be helpful in the care of [name] at this time.  Micheline Rough, MD [place] Team [phone]

## 2015-08-15 NOTE — Progress Notes (Signed)
[place] for warfarin Indication: h/o DVT/PE and afib  No Known Allergies  Patient Measurements: Height: 5\' 10"  (177.8 cm) Weight:  (on HD chair) IBW/kg (Calculated) : 73   Vital Signs: Temp: 97.1 F (36.2 C) (07/10 0810) Temp Source: Oral (07/10 0810) BP: 100/54 mmHg (07/10 0949) Pulse Rate: 116 (07/10 0949)  Labs:  Recent Labs  08/13/15 0609 08/13/15 0815  08/13/15 0823 08/14/15 1458 08/15/15 0500  HGB  --   --   < > 9.0* 9.1* 8.9*  HCT  --   --   --  30.6* 30.0* 30.2*  PLT  --   --   --  244 235 250  LABPROT 26.8*  --   --   --  28.7* 29.0*  INR 2.51*  --   --   --  2.76* 2.79*  CREATININE  --  2.56*  --   --  2.48* 2.89*  < > = values in this interval not displayed.  Estimated Creatinine Clearance: 27.4 mL/min (by C-G formula based on Cr of 2.89).  Assessment: 75 y.o. M on warfarin PTA for h/o DVT and afib - per SNF MAR, warfarin had been on hold since 6/28 due to elevated INR.   INR elevated upon admission to hospital (4.75), last dose of Coumadin PTA was 6/28.  INR now within goal range- 2.79 this morning.  Hgb 8.9, plts 250- no bleeding noted. Due for dose of Aranesp today, not on iron supplementation (last TSat 22% per renal notes)  Goal of Therapy:  INR 2-3 Monitor platelets by anticoagulation protocol: Yes   Plan:  Warfarin 2.5mg  daily for now Daily INR Monitor s/sx of bleeding  Rashi Giuliani D. Shakina Choy, PharmD, BCPS Clinical Pharmacist Pager: [phone] 08/15/2015 10:23 AM

## 2015-08-15 NOTE — Progress Notes (Deleted)
Mother of pt called to discuss information about her daughter. Gave phone to pt to talk with mother. Pt gave nurse verbal permission to speak with mother. Mother requested pt go to Drug Rehab facility, and for nurse to speak with Social Services. Advised mother that was out of my scope of practice and notified Case Worker Lorriane Shire. Lorriane Shire will provide pt with a list of Rehab facilities for her to seek treatment from once d/c'd from hospital.

## 2015-08-15 NOTE — Progress Notes (Signed)
Pharmacy Antibiotic Note [name] is a 75 y.o. male admitted on 08/07/2015 that is currently on Primaxin for ESBL K. Pneumoniae bacteremia.    He was on CRRT, but has been transferred to HD and is tolerating full sessions. Currently in HD.  Plan: - Primaxin 250mg  IV q12h  - Plan is for 14 days of therapy- Through 7/17 as day #1 was 7/3 - F/u on toleration of HD and adjust dose as needed    Height: 5\' 10"  (177.8 cm) Weight:  (on HD chair) IBW/kg (Calculated) : 73  Temp (24hrs), Avg:97.7 F (36.5 C), Min:97.1 F (36.2 C), Max:98.2 F (36.8 C)   Recent Labs Lab 08/11/15 0530 08/11/15 1600 08/12/15 0400 08/13/15 0815 08/13/15 0823 08/14/15 1458 08/15/15 0500  WBC 5.2  --  4.5  --  6.8 7.0 6.0  CREATININE 1.23 1.29* 1.34* 2.56*  --  2.48* 2.89*    Estimated Creatinine Clearance: 27.4 mL/min (by C-G formula based on Cr of 2.89).    No Known Allergies  Antimicrobials this admission: Zosyn 7/2 x 1 Vanc 7/2>>7/5 Cefepime 7/3 x1 Anidulafungin 7/3 x 1 - changed to diflucan but never given Primaxin 7/3>>(14d)  Dose adjustments this admission: 7/7- Primaxin changed to 250mg  IV q12h for HD dosing   Microbiology results: 7/2 BCx: 1/2 Kleb pneumo (sens to unasyn, cipro, imipenem, zosyn and bactrim) 7/2 UCx: mult species 7/4 BCx: neg  Thank you for allowing pharmacy to be a part of this patient's care.  Romain Erion D. [place] Paone, PharmD, BCPS Clinical Pharmacist Pager: [phone] 08/15/2015 10:27 AM

## 2015-08-15 NOTE — Procedures (Signed)
Seen on HD.  UF goal of 3 kg w dialysis today.  BP's remain low, on midodrine.  HD in a chair today.     I was present at this dialysis session, have reviewed the session itself and made  appropriate changes Kelly Splinter MD [place] pager [phone]    cell [phone] 08/15/2015, 11:07 AM

## 2015-08-15 NOTE — Progress Notes (Signed)
Nurse from Dialysis called, pt requesting CPAP. Hemo nurse will notify respiratory.

## 2015-08-15 NOTE — Progress Notes (Signed)
Pt placed on CPAP 37mcH20 with full face mask in hemodialysis per pt request. Pt is tolerating well at this time.

## 2015-08-16 LAB — PROTIME-INR
INR: 3.02 — AB (ref 0.00–1.49)
PROTHROMBIN TIME: 30.7 s — AB (ref 11.6–15.2)

## 2015-08-16 LAB — CBC
HCT: 27.8 % — ABNORMAL LOW (ref 39.0–52.0)
HEMOGLOBIN: 8.5 g/dL — AB (ref 13.0–17.0)
MCH: 27.5 pg (ref 26.0–34.0)
MCHC: 30.6 g/dL (ref 30.0–36.0)
MCV: 90 fL (ref 78.0–100.0)
Platelets: 233 10*3/uL (ref 150–400)
RBC: 3.09 MIL/uL — ABNORMAL LOW (ref 4.22–5.81)
RDW: 17.7 % — AB (ref 11.5–15.5)
WBC: 5.1 10*3/uL (ref 4.0–10.5)

## 2015-08-16 LAB — RENAL FUNCTION PANEL
ALBUMIN: 1.5 g/dL — AB (ref 3.5–5.0)
ANION GAP: 6 (ref 5–15)
BUN: 26 mg/dL — AB (ref 6–20)
CALCIUM: 7.5 mg/dL — AB (ref 8.9–10.3)
CO2: 28 mmol/L (ref 22–32)
CREATININE: 2.31 mg/dL — AB (ref 0.61–1.24)
Chloride: 101 mmol/L (ref 101–111)
GFR calc Af Amer: 31 mL/min — ABNORMAL LOW (ref >60)
GFR calc non Af Amer: 26 mL/min — ABNORMAL LOW (ref >60)
GLUCOSE: 114 mg/dL — AB (ref 65–99)
Phosphorus: 2.9 mg/dL (ref 2.5–4.6)
Potassium: 3.2 mmol/L — ABNORMAL LOW (ref 3.5–5.1)
SODIUM: 135 mmol/L (ref 135–145)

## 2015-08-16 LAB — GLUCOSE, CAPILLARY
GLUCOSE-CAPILLARY: 109 mg/dL — AB (ref 65–99)
Glucose-Capillary: 163 mg/dL — ABNORMAL HIGH (ref 65–99)
Glucose-Capillary: 117 mg/dL — ABNORMAL HIGH (ref 65–99)
Glucose-Capillary: 152 mg/dL — ABNORMAL HIGH (ref 65–99)

## 2015-08-16 MED ORDER — WARFARIN SODIUM 1 MG PO TABS
1.0000 mg | ORAL_TABLET | Freq: Once | ORAL | Status: AC
  Administered 2015-08-16: 1 mg via ORAL
  Filled 2015-08-16 (×2): qty 1

## 2015-08-16 MED ORDER — POLYETHYLENE GLYCOL 3350 17 G PO PACK
17.0000 g | PACK | Freq: Every day | ORAL | Status: DC
  Administered 2015-08-16 – 2015-08-23 (×4): 17 g via ORAL
  Filled 2015-08-16 (×7): qty 1

## 2015-08-16 MED ORDER — SENNOSIDES-DOCUSATE SODIUM 8.6-50 MG PO TABS
1.0000 | ORAL_TABLET | Freq: Two times a day (BID) | ORAL | Status: DC
  Administered 2015-08-16 – 2015-08-23 (×8): 1 via ORAL
  Filled 2015-08-16 (×11): qty 1

## 2015-08-16 NOTE — Progress Notes (Signed)
[facility] Progress Note  Assessment/Plan: 1. Klebsiella sepsis/ UTI Tmax 99.7- on imipenem Not sure why he is still febrile 2. Vol overload/ pulm edema - improved Na up to 135 3. ESRD -  MWF K 3.2 after HD yesterday - use 4 K bath Wed 4. 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 Nutrition alb 1.5 - liberalize diet to carb mod - to help with P support/intake - K not an issue at present; on renavits/prostat 8. Chronic coumadin - afib hx DVT, INR 2.6 9. DNR - palliative care seeing; very debilitated 10. Debility - this is his main issue right now, more so than dialysis  Myriam Jacobson, PA-C [place] [phone] 08/16/2015,9:02 AM  LOS: 9 days   Pt seen, examined and agree w A/P as above. HD Wed, max UF, will lower BP threshold to 75 approx w HD.  Kelly Splinter MD [facility] pager [phone]    cell [phone] 08/16/2015, 12:57 PM    Subjective:   No c/o - reordering breakfast because it was cold because he was having a bath  Objective Filed Vitals:   08/15/15 2328 08/16/15 0456 08/16/15 0757 08/16/15 0829  BP:  103/69  104/72  Pulse: 84 91 92 92  Temp:  99.7 F (37.6 C)  99.4 F (37.4 C)  TempSrc:  Oral  Oral  Resp: 18 18 18 18   Height:      Weight:      SpO2: 100% 98% 97% 98%   Physical Exam General: NAD but ill appearing Heart: RRR Lungs: no rales Abdomen: soft NT Extremities: 1 + LE edema with some dependent edema/thighs/buttocks Dialysis Access: left AVF maturing right IJ  Dialysis Orders: [facility] MWF 4h 109.5kg 3K/2.25 bath Hep none R IJ cath/ LUA AVF inserted 6/19 (maturing) Mircera 75 ug not started yet at OP unit tsat 22%, ferr 1958 Phos 3.7 pth 64  Additional  Objective Labs: Basic Metabolic Panel:  Recent Labs Lab 08/14/15 1458 08/15/15 0500 08/16/15 0550  NA 132* 133* 135  K 4.1 4.0 3.2*  CL 100* 99* 101  CO2 23 25 28   GLUCOSE 155* 108* 114*  BUN 30* 40* 26*  CREATININE 2.48* 2.89* 2.31*  CALCIUM 7.7* 8.0* 7.5*  PHOS 2.8 3.7 2.9   Liver Function Tests:  Recent Labs Lab 08/14/15 1458 08/15/15 0500 08/16/15 0550  ALBUMIN 1.6* 1.7* 1.5*   CBC:  Recent Labs Lab 08/12/15 0400 08/13/15 0823 08/14/15 1458 08/15/15 0500 08/16/15 0550  WBC 4.5 6.8 7.0 6.0 5.1  NEUTROABS 3.2  --   --   --   --   HGB 9.0* 9.0* 9.1* 8.9* 8.5*  HCT 29.9* 30.6* 30.0* 30.2* 27.8*  MCV 91.4 91.1 90.6 89.9 90.0  PLT 225 244 235 250 233   Blood Culture    Component Value Date/Time   SDES BLOOD RIGHT ANTECUBITAL 08/09/2015 1432   SPECREQUEST BOTTLES DRAWN AEROBIC ONLY 5CC 08/09/2015 1432   CULT NO GROWTH 5 DAYS 08/09/2015 1432   REPTSTATUS 08/14/2015 FINAL 08/09/2015 1432   CBG:  Recent Labs Lab 08/15/15 0739 08/15/15 1249 08/15/15 1657 08/15/15 1952 08/16/15 0727  GLUCAP 114* 80 194* 165* 109*   Lab Results  Component Value Date  INR 3.02* 08/16/2015   INR 2.79* 08/15/2015   INR 2.76* 08/14/2015   Studies/Results: No results found. Medications:   . antiseptic oral rinse  7 mL Mouth Rinse BID  . budesonide (PULMICORT) nebulizer solution  0.5 mg Nebulization BID  . darbepoetin (ARANESP) injection - DIALYSIS  100 mcg Intravenous Q Mon-HD  . famotidine  20 mg Oral Q1200  . feeding supplement (NEPRO CARB STEADY)  237 mL Oral BID BM  . feeding supplement (PRO-STAT SUGAR FREE 64)  30 mL Oral BID  . imipenem-cilastatin  250 mg Intravenous Q12H  . insulin aspart  0-15 Units Subcutaneous TID WC  . ipratropium-albuterol  3 mL Nebulization BID  . metoCLOPramide  5 mg Oral TID AC  . midodrine  10 mg Oral TID WC  . multivitamin  1 tablet Oral QHS  . nystatin  5 mL Oral QID  . sodium chloride flush  3 mL Intravenous Q12H  . warfarin   2.5 mg Oral q1800  . Warfarin - Pharmacist Dosing Inpatient   Does not apply [phone]

## 2015-08-16 NOTE — Progress Notes (Signed)
Physical Therapy Treatment Patient Details Name: LAWARENCE MEEK MRN: [id number] DOB: 01-Aug-1940 Today's Date: 08/16/2015    History of Present Illness Pt re-admitted 08/07/15 (after having being admitted from 4/25-6/25/2017) with fatigue, somnolence, fever. He was found to have UTI. Has been on CVVHD/CRRT until today (08/12/2015) with now looking to attempt if he can tolerate normal HD.    PT Comments    Pt with improved sitting balance for 11 mins while eating breakfast in unsupported sitting with MIN/guard.  He fatigued quickly with this activity.  Sitting balance goal upgraded. Nursing to get up later with lift.  Follow Up Recommendations  LTACH;SNF     Equipment Recommendations  None recommended by PT    Recommendations for Other Services       Precautions / Restrictions Precautions Precautions: Fall Type of Shoulder Precautions: RTC repair 04/2015 - per Cay Schillings 6/5 pt can use his RUE however he wants within his pain tolerance. Only self ROM (this was latest clarification from last admission) Precaution Comments: monitor vitals (HR from 99 at rest up to mid 130s), BP actually went up upon sitting up (BP has been soft) Restrictions Weight Bearing Restrictions: No Other Position/Activity Restrictions: Self ROM Rt UE    Mobility  Bed Mobility Overal bed mobility: Needs Assistance;+2 for physical assistance Bed Mobility: Rolling;Sidelying to Sit;Sit to Sidelying Rolling: Max assist Sidelying to sit: Max assist;+2 for physical assistance     Sit to sidelying: Max assist;+2 for physical assistance General bed mobility comments: Pt attempting to A with rolling and to use UE. Worked on pre/post sitting EOB  Transfers                 General transfer comment: Deferred. Worked on sitting balance and nursing to get up with lift later in day.  Ambulation/Gait                 Stairs            Wheelchair Mobility    Modified Rankin (Stroke Patients  Only)       Balance Overall balance assessment: Needs assistance Sitting-balance support: Feet supported Sitting balance-Leahy Scale: Fair Sitting balance - Comments: Pt was able to sit EOB with min/guard for 11 mins while he fed himself his grits. Needed encouragement to increase his own activities.  (wanting PT to pour syrup, wipe nose, etc, ) When instructed to do himeslf, pt was able to .  no LOB and was able to maintaing upright posture, but very fatigued                             Cognition Arousal/Alertness: Awake/alert Behavior During Therapy: WFL for tasks assessed/performed Overall Cognitive Status: Within Functional Limits for tasks assessed                      Exercises      General Comments        Pertinent Vitals/Pain Pain Assessment: 0-10 Pain Score: 6  Pain Location: "all over" after returned to sitting Pain Descriptors / Indicators: Sore Pain Intervention(s): Repositioned;Patient requesting pain meds-RN notified    Home Living                      Prior Function            PT Goals (current goals can now be found in the care plan section) Acute Rehab PT Goals Patient Stated  Goal: to be able to stand up PT Goal Formulation: With patient/family Time For Goal Achievement: 08/26/15 Potential to Achieve Goals: Fair Progress towards PT goals: Progressing toward goals;Goals met and updated - see care plan    Frequency  Min 2X/week    PT Plan Current plan remains appropriate    Co-evaluation             End of Session   Activity Tolerance: Patient limited by fatigue Patient left: in chair;with call bell/phone within reach;with family/visitor present     Time: 6812-7517 PT Time Calculation (min) (ACUTE ONLY): 26 min  Charges:  $Therapeutic Activity: 23-37 mins                    G Codes:      Nafeesa Dils LUBECK 08/16/2015, 11:30 AM

## 2015-08-16 NOTE — Progress Notes (Addendum)
Patient ID: [name], male   DOB: 1940/09/18, 75 y.o.   MRN: [id number]    PROGRESS NOTE    DONTREAL TAUTE  [name] DOB: 1941-01-08 DOA: 08/07/2015  PCP: Melinda Crutch   Brief Narrative:  75 y.o. male with recent prolonged extensive admission 05/31/15 through 07/31/15 for multiple problems including but not limited to septic shock due to Klebsiella bacteremia Pyelonephritis/UTI complicated by hemorrhagic shock due to coagulopathy with subsequent cardiac arrest, complicated by left pneumothorax following CPR s/p chest tube placement.  He was eventually discharged to SNF on 07/31/15; however, he returned to [facility] ED 08/07/15 with fatigue, somnolence, fever. He was found to have UTI and was initially admitted to SDU as he had responded well to 39ml/kg bolus; however, he later developed hypotension along with significant third spacing. He was started on albumin and due to persistent hypotension, PCCM was asked to admit to ICU for septic shock.  Assessment & Plan:  Septic shock - due to Klebsiella UTI, LLL HCAP - on Imipenem, continue for total 14 days, stop date July 16,2017 - resolved  Hx A.fib (on warfarin) DVT - continue Coumadin per pharmacy  - no further invasive interventions per pt's request  - cardiology has signed off as no further recommendations from their stand point  - d/c telemetry   Acute on chronic hypoxic respiratory failure, pt with hx of OSA on CPAP, hx of PE - Concern for HCAP. CXR with possible LLL infiltrate  - Continue nocturnal CPAP. - pulmicort BID and duoneb qid. - maintaining oxygen saturations at target range   ESRD  - per nephrology   Anemia of chronic disease, ESRD - no signs of bleeding, Hg overall stable  - CBC in AM  Stage 2 Pressure  - MASD (moisture associated skin damage) bilateral buttocks and posterior thigh - 3 small areas noted, superficial 1cm x 1cm x 0.1cm  - these areas are near the rectum, for that reason, would not recommend  dressings because they will continue to get soiled - recommend barrier cream instead of dressings to be applied each shift and after each episode of incontinence.  Hx hemorrhagic shock (due to right perinephric hematoma s/p embolization) with subsequent cardiac arrest - restarted Coumadin, INR therapeutic  - no indication for transfusion at this time   Acute encephalopathy likely related to sepsis - Clinically improved but still rather weak, mostly bed bound   DNR - mental status clear this AM   Obesity  - Body mass index is 32.64 kg/(m^2).  Acute functional quadriplegia - in the setting of multiple and complex medical conditions, acute illness imposed on chronic illnesses - will need LTACH vs SNF - still bed bound   DVT prophylaxis: on coumadin  Code Status: DNR Family Communication: Patient at bedside  Disposition Plan: awaiting to see if LTACH would be possible, otherwise will go to SNF in 1-2 days   Consultants:   Nephrology   Procedures:   None   Antimicrobials:   Blood 07/02 > Kleb pna, Coag (-) staph in 1st bottle; (-) in 2nd bottle  Urine 07/02 > several species  Sputum 07/02 > no specimen  BC 7/4>>>  ANTIBIOTICS:  Vanc 07/02 >>>7/6  Cefepime 07/02 >>>7/3  Imipenem 7/3>>>7/16  Anidulo 7/3>>>off   Subjective: No events overnight.   Objective: Filed Vitals:   08/15/15 2328 08/16/15 0456 08/16/15 0757 08/16/15 0829  BP:  103/69  104/72  Pulse: 84 91 92 92  Temp:  99.7 F (37.6 C)  99.4  F (37.4 C)  TempSrc:  Oral  Oral  Resp: 18 18 18 18   Height:      Weight:      SpO2: 100% 98% 97% 98%    Intake/Output Summary (Last 24 hours) at 08/16/15 1533 Last data filed at 08/16/15 1300  Gross per 24 hour  Intake    460 ml  Output    165 ml  Net    295 ml   Filed Weights   08/15/15 1252 08/15/15 1950  Weight: 102.558 kg (226 lb 1.6 oz) 103.193 kg (227 lb 8 oz)    Examination:  General exam: Appears calm and comfortable, chronically ill   Respiratory system: Respiratory effort normal. Cardiovascular system: RRR. No JVD, rubs, gallops or clicks.  Gastrointestinal system: Abdomen is nondistended, soft and nontender. No organomegaly or masses felt.  Central nervous system: Alert and oriented. No focal neurological deficits. Extremities: chronic bilateral LE venous stasis changes, + 1 edema   Data Reviewed: I have personally reviewed following labs and imaging studies  CBC:  Recent Labs Lab 08/12/15 0400 08/13/15 0823 08/14/15 1458 08/15/15 0500 08/16/15 0550  WBC 4.5 6.8 7.0 6.0 5.1  NEUTROABS 3.2  --   --   --   --   HGB 9.0* 9.0* 9.1* 8.9* 8.5*  HCT 29.9* 30.6* 30.0* 30.2* 27.8*  MCV 91.4 91.1 90.6 89.9 90.0  PLT 225 244 235 250 0000000   Basic Metabolic Panel:  Recent Labs Lab 08/10/15 0615  08/11/15 0530  08/12/15 0400 08/13/15 0815 08/14/15 1458 08/15/15 0500 08/16/15 0550  NA 136  < > 137  < > 136 135 132* 133* 135  K 4.0  < > 4.3  < > 4.3 4.7 4.1 4.0 3.2*  CL 103  < > 105  < > 104 102 100* 99* 101  CO2 26  < > 27  < > 27 26 23 25 28   GLUCOSE 78  < > 95  < > 112* 103* 155* 108* 114*  BUN 20  < > 17  < > 16 34* 30* 40* 26*  CREATININE 1.45*  < > 1.23  < > 1.34* 2.56* 2.48* 2.89* 2.31*  CALCIUM 8.0*  < > 8.0*  < > 7.9* 8.3* 7.7* 8.0* 7.5*  MG 2.0  --  2.3  --  2.2  --   --   --   --   PHOS 2.2*  < > 2.5  < > 2.3* 3.8 2.8 3.7 2.9  < > = values in this interval not displayed.  Coagulation Profile:  Recent Labs Lab 08/12/15 0400 08/13/15 0609 08/14/15 1458 08/15/15 0500 08/16/15 0550  INR 2.57* 2.51* 2.76* 2.79* 3.02*   CBG:  Recent Labs Lab 08/15/15 1249 08/15/15 1657 08/15/15 1952 08/16/15 0727 08/16/15 1143  GLUCAP 80 194* 165* 109* 163*   Urine analysis:    Component Value Date/Time   COLORURINE YELLOW 08/07/2015 2139   APPEARANCEUR TURBID* 08/07/2015 2139   LABSPEC 1.019 08/07/2015 2139   PHURINE 8.0 08/07/2015 2139   GLUCOSEU NEGATIVE 08/07/2015 2139   HGBUR LARGE*  08/07/2015 2139   BILIRUBINUR SMALL* 08/07/2015 2139   [place] NEGATIVE 08/07/2015 2139   PROTEINUR 100* 08/07/2015 2139   UROBILINOGEN 0.2 03/06/2013 1748   NITRITE NEGATIVE 08/07/2015 2139   LEUKOCYTESUR LARGE* 08/07/2015 2139    Recent Results (from the past 240 hour(s))  Urine culture     Status: Abnormal   Collection Time: 08/07/15  8:12 PM  Result Value Ref Range Status  Specimen Description URINE, RANDOM  Final   Special Requests NONE  Final   Culture MULTIPLE SPECIES PRESENT, SUGGEST RECOLLECTION (A)  Final   Report Status 08/08/2015 FINAL  Final  Gram stain     Status: None   Collection Time: 08/07/15  8:12 PM  Result Value Ref Range Status   Specimen Description URINE, RANDOM  Final   Special Requests NONE  Final   Gram Stain   Final    WBC PRESENT, PREDOMINANTLY MONONUCLEAR GRAM NEGATIVE RODS BUDDING YEAST SEEN GRAM POSITIVE COCCI IN CHAINS CYTOSPIN SMEAR    Report Status 08/07/2015 FINAL  Final  Blood Culture (routine x 2)     Status: Abnormal   Collection Time: 08/07/15  8:18 PM  Result Value Ref Range Status   Specimen Description BLOOD BLOOD RIGHT FOREARM  Final   Special Requests BOTTLES DRAWN AEROBIC AND ANAEROBIC 5CC  Final   Culture  Setup Time   Final    GRAM NEGATIVE RODS ANAEROBIC BOTTLE ONLY Organism ID to follow CRITICAL RESULT CALLED TO, READ BACK BY AND VERIFIED WITH: N BATCHELDER 08/08/15 @ [place] IN [place] AEROBIC BOTTLE ONLY CRITICAL RESULT CALLED TO, READ BACK BY AND VERIFIED WITH: L POWELL PHARMD 1751 08/08/15 A BROWNING    Culture (A)  Final    KLEBSIELLA PNEUMONIAE Confirmed Extended Spectrum Beta-Lactamase Producer (ESBL) STAPHYLOCOCCUS SPECIES (COAGULASE NEGATIVE) THE SIGNIFICANCE OF ISOLATING THIS ORGANISM FROM A SINGLE SET OF BLOOD CULTURES WHEN MULTIPLE SETS ARE DRAWN IS UNCERTAIN. PLEASE NOTIFY THE MICROBIOLOGY DEPARTMENT WITHIN ONE WEEK IF SPECIATION AND SENSITIVITIES ARE REQUIRED.    Report Status  08/11/2015 FINAL  Final   Organism ID, Bacteria KLEBSIELLA PNEUMONIAE  Final      Susceptibility   Klebsiella pneumoniae - MIC*    AMPICILLIN >=32 RESISTANT Resistant     CEFAZOLIN >=64 RESISTANT Resistant     CEFEPIME <=1 RESISTANT Resistant     CEFTAZIDIME 2 RESISTANT Resistant     CEFTRIAXONE 8 RESISTANT Resistant     CIPROFLOXACIN 1 SENSITIVE Sensitive     GENTAMICIN 8 INTERMEDIATE Intermediate     IMIPENEM <=0.25 SENSITIVE Sensitive     TRIMETH/SULFA <=20 SENSITIVE Sensitive     AMPICILLIN/SULBACTAM 8 SENSITIVE Sensitive     PIP/TAZO 8 SENSITIVE Sensitive     * KLEBSIELLA PNEUMONIAE  Blood Culture ID Panel (Reflexed)     Status: Abnormal   Collection Time: 08/07/15  8:18 PM  Result Value Ref Range Status   Enterococcus species NOT DETECTED NOT DETECTED Final   Vancomycin resistance NOT DETECTED NOT DETECTED Final   Listeria monocytogenes NOT DETECTED NOT DETECTED Final   Staphylococcus species DETECTED (A) NOT DETECTED Final    Comment: CRITICAL RESULT CALLED TO, READ BACK BY AND VERIFIED WITH: N BATCHELDER 08/08/15 @ 1249 M VESTAL    Staphylococcus aureus NOT DETECTED NOT DETECTED Final   Methicillin resistance DETECTED (A) NOT DETECTED Final    Comment: CRITICAL RESULT CALLED TO, READ BACK BY AND VERIFIED WITH: N BATCHELDER 08/08/15 @ 1249 M VESTAL    Streptococcus species NOT DETECTED NOT DETECTED Final   Streptococcus agalactiae NOT DETECTED NOT DETECTED Final   Streptococcus pneumoniae NOT DETECTED NOT DETECTED Final   Streptococcus pyogenes NOT DETECTED NOT DETECTED Final   Acinetobacter baumannii NOT DETECTED NOT DETECTED Final   Enterobacteriaceae species DETECTED (A) NOT DETECTED Final    Comment: CRITICAL RESULT CALLED TO, READ BACK BY AND VERIFIED WITH: N BATCHELDER 08/08/15 @ 1249 M VESTAL  Enterobacter cloacae complex NOT DETECTED NOT DETECTED Final   Escherichia coli NOT DETECTED NOT DETECTED Final   Klebsiella oxytoca NOT DETECTED NOT DETECTED Final    Klebsiella pneumoniae DETECTED (A) NOT DETECTED Final    Comment: CRITICAL RESULT CALLED TO, READ BACK BY AND VERIFIED WITH: N BATCHELDER 08/08/15 @ 1249 M VESTAL3    Proteus species NOT DETECTED NOT DETECTED Final   Serratia marcescens NOT DETECTED NOT DETECTED Final   Carbapenem resistance NOT DETECTED NOT DETECTED Final   Haemophilus influenzae NOT DETECTED NOT DETECTED Final   Neisseria meningitidis NOT DETECTED NOT DETECTED Final   Pseudomonas aeruginosa NOT DETECTED NOT DETECTED Final   Candida albicans NOT DETECTED NOT DETECTED Final   Candida glabrata NOT DETECTED NOT DETECTED Final   Candida krusei NOT DETECTED NOT DETECTED Final   Candida parapsilosis NOT DETECTED NOT DETECTED Final   Candida tropicalis NOT DETECTED NOT DETECTED Final  Blood Culture (routine x 2)     Status: None   Collection Time: 08/07/15  8:33 PM  Result Value Ref Range Status   Specimen Description BLOOD RIGHT ARM  Final   Special Requests BOTTLES DRAWN AEROBIC AND ANAEROBIC 5CC  Final   Culture NO GROWTH 6 DAYS  Final   Report Status 08/13/2015 FINAL  Final  Culture, blood (routine x 2)     Status: None   Collection Time: 08/09/15  2:25 PM  Result Value Ref Range Status   Specimen Description BLOOD RIGHT HAND  Final   Special Requests IN PEDIATRIC BOTTLE .5CC  Final   Culture NO GROWTH 5 DAYS  Final   Report Status 08/14/2015 FINAL  Final  Culture, blood (routine x 2)     Status: None   Collection Time: 08/09/15  2:32 PM  Result Value Ref Range Status   Specimen Description BLOOD RIGHT ANTECUBITAL  Final   Special Requests BOTTLES DRAWN AEROBIC ONLY 5CC  Final   Culture NO GROWTH 5 DAYS  Final   Report Status 08/14/2015 FINAL  Final      Radiology Studies: No results found.    Scheduled Meds: . antiseptic oral rinse  7 mL Mouth Rinse BID  . budesonide (PULMICORT) nebulizer solution  0.5 mg Nebulization BID  . darbepoetin (ARANESP) injection - DIALYSIS  100 mcg Intravenous Q Mon-HD  .  famotidine  20 mg Oral Q1200  . feeding supplement (NEPRO CARB STEADY)  237 mL Oral BID BM  . feeding supplement (PRO-STAT SUGAR FREE 64)  30 mL Oral BID  . imipenem-cilastatin  250 mg Intravenous Q12H  . insulin aspart  0-15 Units Subcutaneous TID WC  . ipratropium-albuterol  3 mL Nebulization BID  . metoCLOPramide  5 mg Oral TID AC  . midodrine  10 mg Oral TID WC  . multivitamin  1 tablet Oral QHS  . nystatin  5 mL Oral QID  . polyethylene glycol  17 g Oral Daily  . senna-docusate  1 tablet Oral BID  . sodium chloride flush  3 mL Intravenous Q12H  . warfarin  1 mg Oral ONCE-1800  . Warfarin - Pharmacist Dosing Inpatient   Does not apply q1800   Continuous Infusions:    LOS: 9 days   Time spent: 20 minutes   Faye Ramsay, MD [facility] Pager [phone]  If 7PM-7AM, please contact night-coverage [email or web address] Password [facility] 08/16/2015, 3:33 PM

## 2015-08-16 NOTE — Progress Notes (Signed)
[place] for warfarin Indication: h/o DVT/PE and afib  No Known Allergies  Patient Measurements: Height: 5\' 10"  (177.8 cm) Weight: 227 lb 8 oz (103.193 kg) IBW/kg (Calculated) : 73   Vital Signs: Temp: 99.4 F (37.4 C) (07/11 0829) Temp Source: Oral (07/11 0829) BP: 104/72 mmHg (07/11 0829) Pulse Rate: 92 (07/11 0829)  Labs:  Recent Labs  08/14/15 1458 08/15/15 0500 08/16/15 0550  HGB 9.1* 8.9* 8.5*  HCT 30.0* 30.2* 27.8*  PLT 235 250 233  LABPROT 28.7* 29.0* 30.7*  INR 2.76* 2.79* 3.02*  CREATININE 2.48* 2.89* 2.31*    Estimated Creatinine Clearance: 34.3 mL/min (by C-G formula based on Cr of 2.31).  Assessment: 75 y.o. M on warfarin PTA for h/o DVT and afib - per SNF MAR, warfarin had been on hold since 6/28 due to elevated INR.   INR elevated upon admission to hospital (4.75), last dose of Coumadin PTA was 6/28.  INR slightly elevated at  this morning at 3.02. INR has been trending up.   Hgb 8.5, plts 233- no bleeding noted. Aranesp dose administered yesterday (7/10) , not on iron supplementation (last TSat 22% per renal notes)  Goal of Therapy:  INR 2-3 Monitor platelets by anticoagulation protocol: Yes   Plan:  Warfarin 1 mg ordered for tonight. Daily INR Monitor s/sx of bleeding  Uvaldo Bristle, Pharmacy Resident Pager: [phone]  08/16/2015 11:20 AM

## 2015-08-16 NOTE — Clinical Social Work Note (Addendum)
Clinical Social Work Assessment  Patient Details  Name: [name] MRN: [id number] Date of Birth: 06-12-1940  Date of referral:  08/16/15               Reason for consult:  Facility Placement                Permission sought to share information with:  Family Supports Permission granted to share information::  No, patient's wife in room and patient allowed her to actively participate in discussion.   Name::     Lucious Artrip  Agency::     Relationship::  Wife  Contact Information:  [phone]  Housing/Transportation Living arrangements for the past 2 months:  [place] ([place]) Source of Information:  Patient, Spouse Patient Interpreter Needed:  None Criminal Activity/Legal Involvement Pertinent to Current Situation/Hospitalization:  No - Comment as needed Significant Relationships:  Spouse, Other Family Members Lives with:  Spouse (Patient came to hospital from [facility]) Do you feel safe going back to the place where you live?  No (Patient/wife not pleased with the care at SNF, however patient indicated that if their issues are addressed, he will  have no problem returing to facility) Need for family participation in patient care:  Yes (Comment)  Care giving concerns: Patient and wife calmly shared their concerns regarding the care patient has received at skilled nursing facility. Mrs. Radhakrishnan reported that she had planned to meet with administration at the facility regarding their concerns before patient had to come back to hospital. Mr. Tefft expressed to his wife that he still wants her to schedule a face-to-face meeting with administration at facility.  Social Worker assessment / plan:  CSW talked with patient and wife at the bedside regarding discharge planning. Patient was sitting up in the chair at bedside getting ready to eat. He is alert and oriented X4 and was agreeable to talking with CSW. Mr. Cammon expressed that he is not ready for  discharge and they are looking at other options, which CSW acknowledged awareness of the options he talked about. During the conversation regarding their concerns with the skilled facility, patient indicated that if these concerns are addressed and corrected, he does not mind returning to this facility. Patient and wife aware that not many facilities take his primary insurance Nurse, mental health Other), however they are agreeable to CSW also doing a SNF search.   Employment status:  Retired Advertising copywriter, [employer] (Chartered certified accountant is [place] Other, Information systems manager, then [facility]) PT Recommendations:  [place] / Referral to community resources:  Other (Comment Required) (Mrs. Sheeks has skilled facility nursing list from previos visit)  Patient/Family's Response to care:  No concerns expressed regarding care during this hospitalization.   Patient/Family's Understanding of and Emotional Response to Diagnosis, Current Treatment, and Prognosis:  Patient acknowledged that they are having a Palliative Care Meeting to discuss his health and their wishes concerning his ongoing health care.   Emotional Assessment Appearance:  Appears older than stated age Attitude/Demeanor/Rapport:  Other (Appropriate) Affect (typically observed):  Quiet, Flat (Patient's voice is hoarse, which prohibited him from being more engaged, however he did  express himself during the conversation.) Orientation:  Oriented to Self, Oriented to Place, Oriented to  Time, Oriented to Situation Alcohol / Substance use:  Tobacco Use, Alcohol Use, Illicit Drugs (Patient reported that he drinks alcohol and does not drink or use illicit drugs) Psych involvement (Current and /or in the  community):  No (Comment)  Discharge Needs  Concerns to be addressed:  Discharge Planning Concerns (Inpatient rehab and LTAC are also discharge options for patient.) Readmission within the last 30 days:   No Current discharge risk:  Chronically ill, Dependent with Mobility Barriers to Discharge:  Continued Medical Work up   [employer] Mila Homer, LCSW 08/16/2015, 4:36 PM

## 2015-08-16 NOTE — NC FL2 (Signed)
[place] LEVEL OF CARE SCREENING TOOL     IDENTIFICATION  Patient Name: [name] Birthdate: 1940/11/11 Sex: male Admission Date (Current Location): 08/07/2015  [facility] and [place] Number:  Herbalist and Address:  [facility] [place], [place] [address] [place], [place] [address]      Provider Number: [id number]  Attending Physician Name and Address:  Theodis Blaze, MD  Relative Name and Phone Number:  [name] - wife.  Phone [phone]    Current Level of Care: Hospital Recommended Level of Care: [place] (Patient from [facility]) Prior Approval Number:    Date Approved/Denied:   PASRR Number: [id number]  Discharge Plan: SNF    Current Diagnoses: Patient Active Problem List   Diagnosis Date Noted  . Encounter for hospice care discussion   . Palliative care encounter   . Goals of care, counseling/discussion   . Severe sepsis ([place])   . CKD (chronic kidney disease)   . Pressure ulcer 08/08/2015  . Acute respiratory failure with hypoxia ([place])   . UTI (lower urinary tract infection)   . ESRD (end stage renal disease) ([place])   . Hypocalcemia   . Acute encephalopathy   . Chronic anemia   . Fecal occult blood test positive   . HCAP (healthcare-associated pneumonia)   . Sepsis ([place])   . Elevated lactic acid level   . Elevated troponin   . Sepsis secondary to UTI ([place]) 08/07/2015  . Hypoalbuminemia 07/27/2015  . Pneumothorax   . Traumatic pneumothorax   . OSA on CPAP   . Acute on chronic renal failure ([place])   . Hemorrhagic shock   . S/p nephrectomy   . Sepsis due to Klebsiella ([place])   . OSA (obstructive sleep apnea)   . Chronic diastolic CHF (congestive heart failure) ([place])   . Cardiac arrest ([place])   . Acute deep vein thrombosis (DVT) of distal vein of right lower extremity ([place])   . Acute renal failure (ARF) ([place])   . Acute hypoxemic respiratory failure ([place])   . Acute back pain   .  Bleeding   . Chest tube in place   . Perinephric hematoma   . Right flank pain   . Acute respiratory failure ([place])   . Bacteremia due to Klebsiella pneumoniae   . Secondary cardiomyopathy ([place])   . Chronic atrial fibrillation ([place])   . Uncontrolled type 2 diabetes mellitus with complication ([place])   . Dyspnea   . Acute renal failure ([place]) 06/02/2015  . Encounter for central line placement   . Septic shock ([place]) 05/31/2015  . Renal failure   . Urinary tract infectious disease   . Arterial hypotension   . Increased anion gap metabolic acidosis   . Hyponatremia   . Rotator cuff tear 05/03/2015  . Atrial fibrillation ([place]) 11/10/2014  . Obesity (BMI 30-39.9) 11/10/2014  . Type II or unspecified type diabetes mellitus with unspecified complication, uncontrolled 04/06/2013  . Anemia 04/04/2013  . 1St degree AV block 03/07/2013  . Acute blood loss anemia 03/06/2013  . Acute-on-chronic kidney injury, baseline CKD stage 3 03/06/2013  . Hypotension, unspecified 03/06/2013  . Bradycardia 03/06/2013  . Perineal pain 03/06/2013  . Preoperative clearance 03/03/2013  . Gross hematuria 03/02/2013  . Severe obstructive sleep apnea 12/04/2012  . Morbid obesity ([place]) 12/04/2012  . Bilateral lower extremity edema 12/04/2012  . Venous insufficiency 12/04/2012  . History of nephrectomy, unilateral 12/04/2012  . Chronic anticoagulation  12/04/2012  . History of pulmonary embolism 12/04/2012  . Essential hypertension 12/04/2012  . Cardiac murmur 12/04/2012    Orientation RESPIRATION BLADDER Height & Weight     Self, Time, Situation, Place  Normal Continent (Has chronnic foley) Weight: 227 lb 8 oz (103.193 kg) Height:  5\' 10"  (177.8 cm)  BEHAVIORAL SYMPTOMS/MOOD NEUROLOGICAL BOWEL NUTRITION STATUS      Continent Diet (Carb modified)  AMBULATORY STATUS COMMUNICATION OF NEEDS Skin   Total Care (Patient has been unable to ambulate with PT) Verbally Other (Comment) (Moisture associated skin  damageto right/left buttocks-treated with barrier cream; Ecchymosis to right/left arm & leg with foam dressing; skin tear to right/left arm-covered by thin film; weeping to right arm  covered with foam dressing.)                       Personal Care Assistance Level of Assistance  Bathing, Feeding, Dressing Bathing Assistance: Maximum assistance Feeding assistance: Independent Dressing Assistance: Maximum assistance     Functional Limitations Info  Sight, Hearing, Speech Sight Info: Adequate Hearing Info: Adequate Speech Info: Adequate    SPECIAL CARE FACTORS FREQUENCY  PT (By licensed PT)     PT Frequency: Evaluated 7/7 and a minimum of 2X per week therapy recommended OT Frequency: Evaluated 7/7 and a minimum of 2X per week therapy recommended             Contractures Contractures Info: Not present    Additional Factors Info  Code Status, Allergies Code Status Info: DNR Allergies Info: No known allergies   Insulin Sliding Scale Info: 0-15 Units 3X per day with meals Isolation Precautions Info: MRSA     Current Medications (08/16/2015):  This is the current hospital active medication list Current Facility-Administered Medications  Medication Dose Route Frequency Provider Last Rate Last Dose  . antiseptic oral rinse (CPC / CETYLPYRIDINIUM CHLORIDE 0.05%) solution 7 mL  7 mL Mouth Rinse BID Jose Shirl Harris, MD   7 mL at 08/16/15 1047  . budesonide (PULMICORT) nebulizer solution 0.5 mg  0.5 mg Nebulization BID Jose Shirl Harris, MD   0.5 mg at 08/16/15 0757  . Darbepoetin Alfa (ARANESP) injection 100 mcg  100 mcg Intravenous Q Mon-HD Ernest Haber, PA-C   100 mcg at 08/15/15 1121  . famotidine (PEPCID) tablet 20 mg  20 mg Oral Q1200 Lauren D Bajbus, RPH   20 mg at 08/16/15 1207  . feeding supplement (NEPRO CARB STEADY) liquid 237 mL  237 mL Oral BID BM Jose Shirl Harris, MD   237 mL at 08/16/15 1043  . feeding supplement (PRO-STAT SUGAR FREE 64) liquid 30  mL  30 mL Oral BID Jose Shirl Harris, MD   30 mL at 08/16/15 1043  . fentaNYL (SUBLIMAZE) injection 25-50 mcg  25-50 mcg Intravenous Q3H PRN Rigoberto Noel, MD   50 mcg at 08/10/15 0128  . HYDROcodone-acetaminophen (NORCO/VICODIN) 5-325 MG per tablet 1 tablet  1 tablet Oral Q8H PRN Anders Simmonds, MD   1 tablet at 08/16/15 1043  . imipenem-cilastatin (PRIMAXIN) 250 mg in sodium chloride 0.9 % 100 mL IVPB  250 mg Intravenous Q12H Jose Shirl Harris, MD   250 mg at 08/16/15 0517  . insulin aspart (novoLOG) injection 0-15 Units  0-15 Units Subcutaneous TID WC Arenac, MD   3 Units at 08/16/15 1207  . ipratropium-albuterol (DUONEB) 0.5-2.5 (3) MG/3ML nebulizer solution 3 mL  3 mL Nebulization BID Jose Shirl Harris, MD   3 mL at 08/16/15 0757  . ipratropium-albuterol (DUONEB) 0.5-2.5 (3) MG/3ML nebulizer solution 3 mL  3 mL Nebulization Q4H PRN Jose Angelo A Corrie Dandy, MD      . metoCLOPramide (REGLAN) tablet 5 mg  5 mg Oral TID AC Lauren D Bajbus, RPH   5 mg at 08/16/15 1207  . midodrine (PROAMATINE) tablet 10 mg  10 mg Oral TID WC Jose Shirl Harris, MD   10 mg at 08/16/15 1207  . multivitamin (RENA-VIT) tablet 1 tablet  1 tablet Oral QHS Ernest Haber, PA-C   1 tablet at 08/15/15 2101  . nystatin (MYCOSTATIN) 100000 UNIT/ML suspension 500,000 Units  5 mL Oral QID Reubin Milan, MD   500,000 Units at 08/16/15 1043  . ondansetron (ZOFRAN) 8 mg in sodium chloride 0.9 % 50 mL IVPB  8 mg Intravenous Q6H PRN [name], MD   8 mg at 08/12/15 [id number]  . ondansetron (ZOFRAN) tablet 4 mg  4 mg Oral Q6H PRN Reubin Milan, MD   4 mg at 08/16/15 0831  . polyvinyl alcohol (LIQUIFILM TEARS) 1.4 % ophthalmic solution 1 drop  1 drop Both Eyes BID PRN Reubin Milan, MD      . simethicone [facility]) chewable tablet 80 mg  80 mg Oral Q6H PRN Anders Simmonds, MD   80 mg at 08/10/15 2210  . sodium chloride flush (NS) 0.9 % injection 3 mL  3 mL Intravenous Q12H Reubin Milan,  MD   3 mL at 08/16/15 1049  . warfarin (COUMADIN) tablet 1 mg  1 mg Oral ONCE-1800 Lauren D Bajbus, RPH      . Warfarin - Pharmacist Dosing Inpatient   Does not apply q1800 Lyndee Leo, [facility]   0  at 08/11/15 1800     Discharge Medications: Please see discharge summary for a list of discharge medications.  Relevant Imaging Results:  Relevant Lab Results:   Additional Information Dialysis MWF [facility]  Jadiel Schmieder Giv[place], [place]

## 2015-08-17 LAB — BASIC METABOLIC PANEL
Anion gap: 8 (ref 5–15)
BUN: 41 mg/dL — ABNORMAL HIGH (ref 6–20)
CHLORIDE: 100 mmol/L — AB (ref 101–111)
CO2: 27 mmol/L (ref 22–32)
CREATININE: 3.04 mg/dL — AB (ref 0.61–1.24)
Calcium: 7.9 mg/dL — ABNORMAL LOW (ref 8.9–10.3)
GFR calc non Af Amer: 19 mL/min — ABNORMAL LOW (ref >60)
GFR, EST AFRICAN AMERICAN: 22 mL/min — AB (ref >60)
Glucose, Bld: 102 mg/dL — ABNORMAL HIGH (ref 65–99)
Potassium: 3.5 mmol/L (ref 3.5–5.1)
Sodium: 135 mmol/L (ref 135–145)

## 2015-08-17 LAB — CBC
HEMATOCRIT: 26.6 % — AB (ref 39.0–52.0)
HEMOGLOBIN: 8.3 g/dL — AB (ref 13.0–17.0)
MCH: 27.9 pg (ref 26.0–34.0)
MCHC: 31.2 g/dL (ref 30.0–36.0)
MCV: 89.3 fL (ref 78.0–100.0)
Platelets: 254 10*3/uL (ref 150–400)
RBC: 2.98 MIL/uL — ABNORMAL LOW (ref 4.22–5.81)
RDW: 17.8 % — ABNORMAL HIGH (ref 11.5–15.5)
WBC: 5.1 10*3/uL (ref 4.0–10.5)

## 2015-08-17 LAB — PROTIME-INR
INR: 2.72 — AB (ref 0.00–1.49)
Prothrombin Time: 28.4 seconds — ABNORMAL HIGH (ref 11.6–15.2)

## 2015-08-17 LAB — GLUCOSE, CAPILLARY
GLUCOSE-CAPILLARY: 59 mg/dL — AB (ref 65–99)
GLUCOSE-CAPILLARY: 149 mg/dL — AB (ref 65–99)
Glucose-Capillary: 85 mg/dL (ref 65–99)

## 2015-08-17 MED ORDER — WARFARIN SODIUM 1 MG PO TABS
1.0000 mg | ORAL_TABLET | Freq: Once | ORAL | Status: AC
  Administered 2015-08-17: 1 mg via ORAL
  Filled 2015-08-17: qty 1

## 2015-08-17 NOTE — Progress Notes (Addendum)
Daily Progress Note   Patient Name: [name]       Date: 08/17/2015 DOB: April 22, 1940  Age: 75 y.o. MRN#: [id number] Attending Physician: Theodis Blaze, MD Primary Care Physician: Melinda Crutch Admit Date: 08/07/2015  Reason for Consultation/Follow-up: psycho social and spiritual support  Subjective:  I'm afraid they are going to discharge me too soon.  Patient asked lots of questions about his progress, insurance and discharge plans.  I listened offered to have CSM/CSW follow up (which they did this afternoon) to answer questions about insurance and discharge.  We will meet again 7/13 to review his progress, goals and a MOST form.   Length of Stay: 10  Current Medications: Scheduled Meds:  . antiseptic oral rinse  7 mL Mouth Rinse BID  . budesonide (PULMICORT) nebulizer solution  0.5 mg Nebulization BID  . darbepoetin (ARANESP) injection - DIALYSIS  100 mcg Intravenous Q Mon-HD  . famotidine  20 mg Oral Q1200  . feeding supplement (NEPRO CARB STEADY)  237 mL Oral BID BM  . feeding supplement (PRO-STAT SUGAR FREE 64)  30 mL Oral BID  . imipenem-cilastatin  250 mg Intravenous Q12H  . insulin aspart  0-15 Units Subcutaneous TID WC  . ipratropium-albuterol  3 mL Nebulization BID  . metoCLOPramide  5 mg Oral TID AC  . midodrine  10 mg Oral TID WC  . multivitamin  1 tablet Oral QHS  . nystatin  5 mL Oral QID  . polyethylene glycol  17 g Oral Daily  . senna-docusate  1 tablet Oral BID  . sodium chloride flush  3 mL Intravenous Q12H  . warfarin  1 mg Oral ONCE-1800  . Warfarin - Pharmacist Dosing Inpatient   Does not apply q1800    Continuous Infusions:    PRN Meds: fentaNYL (SUBLIMAZE) injection, HYDROcodone-acetaminophen, ipratropium-albuterol, ondansetron (ZOFRAN) IV, ondansetron  **OR** [DISCONTINUED] ondansetron (ZOFRAN) IV, polyvinyl alcohol, simethicone  Physical Exam         Chronically ill appearing male, A&O x 3, asking intelligent questions.  Wife at bedside.  Overall appears improved from last week. CV irreg. resp NA Abdomen soft Extremities:  Finger tips are light purple and chilled.  LEE is 2+, not tight   Vital Signs: BP 95/60 mmHg  Pulse 95  Temp(Src) 98 F (36.7 C) (Oral)  Resp 18  Ht 5\' 10"  (1.778 m)  Wt 101.2 kg (223 lb 1.7 oz)  BMI 32.01 kg/m2  SpO2 100% SpO2: SpO2: 100 % O2 Device: O2 Device: Not Delivered O2 Flow Rate: O2 Flow Rate (L/min): 1 L/min  Intake/output summary:   Intake/Output Summary (Last 24 hours) at 08/17/15 1601 Last data filed at 08/17/15 1500  Gross per 24 hour  Intake    720 ml  Output   2698 ml  Net  -1978 ml   LBM: Last BM Date: 08/16/15 Baseline Weight: Weight: 112.2 kg (247 lb 5.7 oz) Most recent weight: Weight: 101.2 kg (223 lb 1.7 oz)       Palliative Assessment/Data:    Flowsheet Rows        Most Recent Value   Intake Tab    Referral Department  Critical care   Unit at Time of Referral  ICU   Palliative Care Primary Diagnosis  Other (Comment)   Date Notified  08/08/15   Palliative Care Type  New Palliative care   Reason for referral  Clarify Goals of Care   Date of Admission  08/07/15   Date first seen by Palliative Care  08/08/15   # of days Palliative referral response time  0 Day(s)   # of days IP prior to Palliative referral  1   Clinical Assessment    Palliative Performance Scale Score  30%   Psychosocial & Spiritual Assessment    Palliative Care Outcomes    Patient/Family meeting held?  Yes      Patient Active Problem List   Diagnosis Date Noted  . Encounter for hospice care discussion   . Palliative care encounter   . Goals of care, counseling/discussion   . Severe sepsis ([place])   . CKD (chronic kidney disease)   . Pressure ulcer 08/08/2015  . Acute respiratory failure with  hypoxia ([place])   . UTI (lower urinary tract infection)   . ESRD (end stage renal disease) ([place])   . Hypocalcemia   . Acute encephalopathy   . Chronic anemia   . Fecal occult blood test positive   . HCAP (healthcare-associated pneumonia)   . Sepsis ([place])   . Elevated lactic acid level   . Elevated troponin   . Sepsis secondary to UTI ([place]) 08/07/2015  . Hypoalbuminemia 07/27/2015  . Pneumothorax   . Traumatic pneumothorax   . OSA on CPAP   . Acute on chronic renal failure ([place])   . Hemorrhagic shock   . S/p nephrectomy   . Sepsis due to Klebsiella ([place])   . OSA (obstructive sleep apnea)   . Chronic diastolic CHF (congestive heart failure) ([place])   . Cardiac arrest ([place])   . Acute deep vein thrombosis (DVT) of distal vein of right lower extremity ([place])   . Acute renal failure (ARF) ([place])   . Acute hypoxemic respiratory failure ([place])   . Acute back pain   . Bleeding   . Chest tube in place   . Perinephric hematoma   . Right flank pain   . Acute respiratory failure ([place])   . Bacteremia due to Klebsiella pneumoniae   . Secondary cardiomyopathy ([place])   . Chronic atrial fibrillation ([place])   . Uncontrolled type 2 diabetes mellitus with complication ([place])   . Dyspnea   . Acute renal failure ([place]) 06/02/2015  . Encounter for central line placement   . Septic shock ([place]) 05/31/2015  . Renal failure   . Urinary tract infectious disease   . Arterial  hypotension   . Increased anion gap metabolic acidosis   . Hyponatremia   . Rotator cuff tear 05/03/2015  . Atrial fibrillation ([place]) 11/10/2014  . Obesity (BMI 30-39.9) 11/10/2014  . Type II or unspecified type diabetes mellitus with unspecified complication, uncontrolled 04/06/2013  . Anemia 04/04/2013  . 1St degree AV block 03/07/2013  . Acute blood loss anemia 03/06/2013  . Acute-on-chronic kidney injury, baseline CKD stage 3 03/06/2013  . Hypotension, unspecified 03/06/2013  . Bradycardia 03/06/2013  . Perineal pain 03/06/2013   . Preoperative clearance 03/03/2013  . Gross hematuria 03/02/2013  . Severe obstructive sleep apnea 12/04/2012  . Morbid obesity ([place]) 12/04/2012  . Bilateral lower extremity edema 12/04/2012  . Venous insufficiency 12/04/2012  . History of nephrectomy, unilateral 12/04/2012  . Chronic anticoagulation 12/04/2012  . History of pulmonary embolism 12/04/2012  . Essential hypertension 12/04/2012  . Cardiac murmur 12/04/2012    Palliative Care Assessment & Plan   Patient Profile: 75 y.o. male with past medical history of prostate CA s/p radiation 2011 and RCC s/p left nephrectomy,chronic foley cath, sleep apnea on CPAP, prior DVT and saddle PE, CK-MB stage III, HTN, DM. Recent prolonged admission 05/31/2015-07/31/2014 with AKI, Klebsiella bacteremia, peritoneal right perinephric hematoma, cardiac arrest x 2, post CPR left pneumothorax requiring chest tube placement, intubated x 2, and also ESRD and initiated hemodialysis. Admitted on 08/07/2015 with septic shock d/t UTI vs HCAP.  Assessment: Tolerating HD as an inpatient on midodrine.  Per nephrology he is likely too debilitated to tolerate HD outpatient at this point.  He is not a candidate for LTACH (per insurance), and not a good candidate for SNF as he has a limited ability to work with PT at this point (2 person assist) and it will be difficult for him to travel to HD.   Difficult disposition.    He has incredible drive (I'm quite impressed he is alive).  He may well improve to the point where he will be a good SNF candidate.  I expect daily improvement from this patient.  Will return on 7/13 to complete a MOST form with patient and family.  Code Status: DNR  Prognosis:   Unable to determine.  Discharge Planning:  Recommend D/C to SNF when appropriate.  Care plan was discussed with case manager and social work.  Thank you for allowing the Palliative Medicine Team to assist in the care of this patient.   Time In: 12:30 Time Out:  1:00 Total Time 30 Prolonged Time Billed no      Greater than 50%  of this time was spent counseling and coordinating care related to the above assessment and plan.  Imogene Burn, PA-C Palliative Medicine Pager: [phone]  Please contact Palliative Medicine Team phone at [phone] for questions and concerns.

## 2015-08-17 NOTE — Clinical Social Work Note (Signed)
CSW and nurse case manager visited with patient and his wife to discuss discharge planning, insurance, dialysis and his debility that is hindering him from being able to d/c to SNF and be transported to HD. Talked with patient/wife about the possibility of seeking a facility out-of-state [facility]) that does dialysis in-house. Patient became tearful at the thought of possibly having to go out of state. Wife, CSW and nurse case manager offered understanding and support. Also talked about [facility] due to the HD Center being very near-by. Call made to [facility] and they do accept patient's insurance. CSW will continue to follow patient's progress and work on viable discharge options for patient.  Gala Padovano Givens, MSW, LCSW Licensed Clinical Social Worker [place] [phone]

## 2015-08-17 NOTE — Progress Notes (Signed)
Nutrition Follow-up  DOCUMENTATION CODES:   Obesity unspecified  INTERVENTION:  Continue Nepro Shake po BID, each supplement provides 425 kcal and 19 grams protein.  Continue 30 ml Prostat po BID, each supplement provides 100 kcal and 15 grams of protein.   Encourage adequate PO intake.   NUTRITION DIAGNOSIS:   Increased nutrient needs related to chronic illness, wound healing as evidenced by estimated needs; ongoing  GOAL:   Patient will meet greater than or equal to 90% of their needs; progressing  MONITOR:   PO intake, Supplement acceptance, Labs, Weight trends, Skin, I & O's  REASON FOR ASSESSMENT:   Consult Assessment of nutrition requirement/status (Albumin 1.7, please talk to family about nutrition)  ASSESSMENT:   [name] new ESRD MWF ([place] 3 op hd txs last on 08/05/15 uneventful except left below edw 109.5 At 106.8) Ho prostate CA s/p radiation 2011 and RCC s/p left nephrectomy and most recently extensive admit 05/31/15 > 07/31/15 for multiple problems including but not limited to Klebsiella bacteremia with septic shock with Pyelonephritis/UTI complicated by hemorrhagic shock due to coagulopathy with subsequent cardiac arrest, complicated by left pneumothorax following CPR s/p chest tube placement.   Meal completion has been 10-50%. Pt currently has Nepro Shake and Prostat ordered and has been consuming them. RD to continue with current orders. Pt encouraged to eat his food at meals and to drink his supplements. RD to continue to monitor. Labs and medications reviewed.   Diet Order:  Diet Carb Modified Fluid consistency:: Thin; Room service appropriate?: Yes; Fluid restriction:: 1200 mL Fluid  Skin:   (Incision on L arm, R arm skin tear)  Last BM:  7/11  Height:   Ht Readings from Last 1 Encounters:  08/12/15 5\' 10"  (1.778 m)    Weight:   Wt Readings from Last 1 Encounters:  08/17/15 223 lb 1.7 oz (101.2 kg)    Ideal Body Weight:  75  kg  BMI:  Body mass index is 32.01 kg/(m^2).  Estimated Nutritional Needs:   Kcal:  1950-2150  Protein:  110-130 gm  Fluid:  1.2 L  EDUCATION NEEDS:   No education needs identified at this time  Corrin Parker, MS, RD, LDN Pager # [phone] After hours/ weekend pager # [phone]

## 2015-08-17 NOTE — Progress Notes (Signed)
[facility] Progress Note  Assessment: 1. Klebsiella sepsis/ UTI - on primaxin 2. Vol overload/ pulm edema - improved 3. ESRD -  MWF hd 4. 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 Nutrition alb 1.5 - liberalize diet to carb mod - to help with P support/intake - K not an issue at present; on renavits/prostat 8. Chronic coumadin - afib hx DVT, INR 2.6 9. DNR - palliative care seeing; very debilitated 10. Debility - will not be outpatient candidate in his debilitated state; primary looking at [facility] placement   Plan - HD today   Kelly Splinter MD [place] pager [phone]    cell [phone] 08/17/2015, 12:56 PM        Subjective:   No c/o - reordering breakfast because it was cold because he was having a bath  Objective Filed Vitals:   08/17/15 1045 08/17/15 1115 08/17/15 1145 08/17/15 1216  BP: 99/54 98/60 86/48  96/58  Pulse: 90 94 104 92  Temp:    97.3 F (36.3 C)  TempSrc:    Oral  Resp:    16  Height:      Weight:    101.2 kg (223 lb 1.7 oz)  SpO2:    100%   Physical Exam General: NAD but ill appearing Heart: RRR Lungs: no rales Abdomen: soft NT Extremities: 1 + LE edema with some dependent edema/thighs/buttocks Dialysis Access: left AVF maturing right IJ  Dialysis Orders: [place] MWF 4h 109.5kg 3K/2.25 bath Hep none R IJ cath/ LUA AVF inserted 6/19 (maturing) Mircera 75 ug not started yet at OP unit tsat 22%, ferr 1958 Phos 3.7 pth 64  Additional Objective Labs: Basic Metabolic Panel:  Recent Labs Lab 08/14/15 1458 08/15/15 0500 08/16/15 0550 08/17/15 0334  NA 132* 133* 135 135  K 4.1 4.0 3.2* 3.5  CL 100* 99* 101 100*  CO2 23 25 28 27   GLUCOSE 155* 108* 114* 102*  BUN 30* 40* 26* 41*  CREATININE 2.48* 2.89* 2.31* 3.04*  CALCIUM 7.7* 8.0* 7.5* 7.9*  PHOS 2.8 3.7 2.9  --     Liver Function Tests:  Recent Labs Lab 08/14/15 1458 08/15/15 0500 08/16/15 0550  ALBUMIN 1.6* 1.7* 1.5*   CBC:  Recent Labs Lab 08/12/15 0400 08/13/15 0823 08/14/15 1458 08/15/15 0500 08/16/15 0550 08/17/15 0334  WBC 4.5 6.8 7.0 6.0 5.1 5.1  NEUTROABS 3.2  --   --   --   --   --   HGB 9.0* 9.0* 9.1* 8.9* 8.5* 8.3*  HCT 29.9* 30.6* 30.0* 30.2* 27.8* 26.6*  MCV 91.4 91.1 90.6 89.9 90.0 89.3  PLT 225 244 235 250 233 254   Blood Culture    Component Value Date/Time   SDES BLOOD RIGHT ANTECUBITAL 08/09/2015 1432   SPECREQUEST BOTTLES DRAWN AEROBIC ONLY 5CC 08/09/2015 1432   CULT NO GROWTH 5 DAYS 08/09/2015 1432   REPTSTATUS 08/14/2015 FINAL 08/09/2015 1432   CBG:  Recent Labs Lab 08/16/15 0727 08/16/15 1143 08/16/15 1714 08/16/15 2040 08/17/15 1246  GLUCAP 109* 163* 117* 152* 59*   Lab Results  Component Value Date   INR 2.72* 08/17/2015   INR 3.02* 08/16/2015   INR 2.79* 08/15/2015   Studies/Results: No results found. Medications:   . antiseptic oral rinse  7 mL Mouth Rinse BID  . budesonide (PULMICORT) nebulizer solution  0.5 mg Nebulization BID  . darbepoetin (ARANESP) injection - DIALYSIS  100 mcg Intravenous Q Mon-HD  . famotidine  20 mg Oral Q1200  . feeding supplement (NEPRO CARB STEADY)  237 mL Oral BID BM  . feeding supplement (PRO-STAT SUGAR FREE 64)  30 mL Oral BID  . imipenem-cilastatin  250 mg Intravenous Q12H  . insulin aspart  0-15 Units Subcutaneous TID WC  . ipratropium-albuterol  3 mL Nebulization BID  . metoCLOPramide  5 mg Oral TID AC  . midodrine  10 mg Oral TID WC  . multivitamin  1 tablet Oral QHS  . nystatin  5 mL Oral QID  . polyethylene glycol  17 g Oral Daily  . senna-docusate  1 tablet Oral BID  . sodium chloride flush  3 mL Intravenous Q12H  . warfarin  1 mg Oral ONCE-1800  . Warfarin - Pharmacist Dosing Inpatient   Does not apply [phone]

## 2015-08-17 NOTE — Care Management Note (Addendum)
Case Management Note  Patient Details  Name: [name] MRN: [id number] Date of Birth: 1940-06-04  Subjective/Objective:       CM following for progression and d/c planning.              Action/Plan: 08/17/2015 Asked [facility] to review this pt for possible admission on 08/16/2015 as this pt has [employer] which networks with [facility]. Pt was reviewed by [facility] on previous adm and his insurance denied that admission. Upon review per [facility] rep. A Pike, this pt does not meet acute care criteria.  Therefore is not a LTAC candidate.  Met with pt and wife, re d/c options, explained that pt is not an LTAC candidate also explained problems related to pt being able to d/c to SNF as travel to HD center may be impossible at this time.  Possibility of finding a SNF with inpt HD was explored and discussed. Pt and wife tearful and wanting to cancel insurance that they feel are not paying as they should. Pt and wife were advised not to begin cancelling insurances at that time as pt has multiple hospitalizations that are currently being reviewed by his insurance. It was also explained that the pt would not meet LTAC criteria if his medicare became primary. Will continue to follow and explore options.   Expected Discharge Date:                  Expected Discharge Plan:  [place]  In-House Referral:  Clinical Social Work, Hospice / Palliative Care  Discharge planning Services     Post Acute Care Choice:    Choice offered to:     DME Arranged:    DME Agency:     HH Arranged:    HH Agency:     Status of Service:     If discussed at [employer], dates discussed:    Additional Comments:  Adron Bene, RN 08/17/2015, 1:25 PM

## 2015-08-17 NOTE — Progress Notes (Deleted)
[facility] Progress Note  Assessment: 1. Klebsiella sepsis/ UTI - on primaxin 2. Vol overload/ pulm edema - improved 3. ESRD -  MWF hd 4. 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 Nutrition alb 1.5 - liberalize diet to carb mod - to help with P support/intake - K not an issue at present; on renavits/prostat 8. Chronic coumadin - afib hx DVT, INR 2.6 9. DNR - palliative care seeing; very debilitated 10. Debility - will not be outpatient candidate in his debilitated state; primary looking at [facility] placement   Plan - HD today   Kelly Splinter MD [place] pager [phone]    cell [phone] 08/17/2015, 10:18 AM        Subjective:   No c/o - reordering breakfast because it was cold because he was having a bath  Objective Filed Vitals:   08/17/15 0900 08/17/15 0915 08/17/15 0930 08/17/15 1000  BP: 95/61 88/63 86/62  94/59  Pulse: 89 99 96 86  Temp:      TempSrc:      Resp:      Height:      Weight:      SpO2:       Physical Exam General: NAD but ill appearing Heart: RRR Lungs: no rales Abdomen: soft NT Extremities: 1 + LE edema with some dependent edema/thighs/buttocks Dialysis Access: left AVF maturing right IJ  Dialysis Orders: [facility] MWF 4h 109.5kg 3K/2.25 bath Hep none R IJ cath/ LUA AVF inserted 6/19 (maturing) Mircera 75 ug not started yet at OP unit tsat 22%, ferr 1958 Phos 3.7 pth 64  Additional Objective Labs: Basic Metabolic Panel:  Recent Labs Lab 08/14/15 1458 08/15/15 0500 08/16/15 0550 08/17/15 0334  NA 132* 133* 135 135  K 4.1 4.0 3.2* 3.5  CL 100* 99* 101 100*  CO2 23 25 28 27   GLUCOSE 155* 108* 114* 102*  BUN 30* 40* 26* 41*  CREATININE 2.48* 2.89* 2.31* 3.04*  CALCIUM 7.7* 8.0* 7.5* 7.9*  PHOS 2.8 3.7 2.9  --    Liver Function Tests:  Recent Labs Lab  08/14/15 1458 08/15/15 0500 08/16/15 0550  ALBUMIN 1.6* 1.7* 1.5*   CBC:  Recent Labs Lab 08/12/15 0400 08/13/15 0823 08/14/15 1458 08/15/15 0500 08/16/15 0550 08/17/15 0334  WBC 4.5 6.8 7.0 6.0 5.1 5.1  NEUTROABS 3.2  --   --   --   --   --   HGB 9.0* 9.0* 9.1* 8.9* 8.5* 8.3*  HCT 29.9* 30.6* 30.0* 30.2* 27.8* 26.6*  MCV 91.4 91.1 90.6 89.9 90.0 89.3  PLT 225 244 235 250 233 254   Blood Culture    Component Value Date/Time   SDES BLOOD RIGHT ANTECUBITAL 08/09/2015 1432   SPECREQUEST BOTTLES DRAWN AEROBIC ONLY 5CC 08/09/2015 1432   CULT NO GROWTH 5 DAYS 08/09/2015 1432   REPTSTATUS 08/14/2015 FINAL 08/09/2015 1432   CBG:  Recent Labs Lab 08/15/15 1952 08/16/15 0727 08/16/15 1143 08/16/15 1714 08/16/15 2040  GLUCAP 165* 109* 163* 117* 152*   Lab Results  Component Value Date   INR 2.72* 08/17/2015   INR 3.02* 08/16/2015   INR 2.79* 08/15/2015   Studies/Results: No results found. Medications:   . antiseptic oral rinse  7 mL Mouth Rinse BID  . budesonide (PULMICORT) nebulizer solution  0.5 mg Nebulization BID  . darbepoetin (ARANESP)  injection - DIALYSIS  100 mcg Intravenous Q Mon-HD  . famotidine  20 mg Oral Q1200  . feeding supplement (NEPRO CARB STEADY)  237 mL Oral BID BM  . feeding supplement (PRO-STAT SUGAR FREE 64)  30 mL Oral BID  . imipenem-cilastatin  250 mg Intravenous Q12H  . insulin aspart  0-15 Units Subcutaneous TID WC  . ipratropium-albuterol  3 mL Nebulization BID  . metoCLOPramide  5 mg Oral TID AC  . midodrine  10 mg Oral TID WC  . multivitamin  1 tablet Oral QHS  . nystatin  5 mL Oral QID  . polyethylene glycol  17 g Oral Daily  . senna-docusate  1 tablet Oral BID  . sodium chloride flush  3 mL Intravenous Q12H  . Warfarin - Pharmacist Dosing Inpatient   Does not apply [phone]

## 2015-08-17 NOTE — Progress Notes (Signed)
[place] for warfarin Indication: h/o DVT/PE and afib  No Known Allergies  Patient Measurements: Height: 5\' 10"  (177.8 cm) Weight: 228 lb 13.4 oz (103.8 kg) IBW/kg (Calculated) : 73   Vital Signs: Temp: 98.2 F (36.8 C) (07/12 0800) Temp Source: Oral (07/12 0800) BP: 86/48 mmHg (07/12 1145) Pulse Rate: 104 (07/12 1145)  Labs:  Recent Labs  08/15/15 0500 08/16/15 0550 08/17/15 0334  HGB 8.9* 8.5* 8.3*  HCT 30.2* 27.8* 26.6*  PLT 250 233 254  LABPROT 29.0* 30.7* 28.4*  INR 2.79* 3.02* 2.72*  CREATININE 2.89* 2.31* 3.04*    Estimated Creatinine Clearance: 26.1 mL/min (by C-G formula based on Cr of 3.04).  Assessment: 75 y.o. M on warfarin PTA for h/o DVT and afib - per SNF MAR, warfarin had been on hold since 6/28 due to elevated INR.   INR elevated upon admission to hospital (4.75), last dose of Coumadin PTA was 6/28.   INR 2.72 today. Hgb 8.3, plts 254- no bleeding noted.    Goal of Therapy:  INR 2-3 Monitor platelets by anticoagulation protocol: Yes   Plan:  Warfarin 1mg  po x1 tonight  Daily INR Monitor s/sx of bleeding  Miyana Mordecai D. Anterio Scheel, PharmD, BCPS Clinical Pharmacist Pager: [phone] 08/17/2015 12:26 PM

## 2015-08-17 NOTE — Progress Notes (Signed)
OT Cancellation Note  Patient Details Name: [name] MRN: [id number] DOB: Jun 21, 1940   Cancelled Treatment:    Reason Eval/Treat Not Completed: Patient at procedure or test/ unavailable -- patient just went to dialysis and is unavailable for treatment. Will follow up for OT treatment per plan of care.  Sie Formisano A 08/17/2015, 8:25 AM

## 2015-08-17 NOTE — Progress Notes (Signed)
Patient ID: [name], male   DOB: [birth year], 75 y.o.   MRN: [id number]    PROGRESS NOTE    [name]  [name] DOB: 1940/06/09 DOA: 08/07/2015  PCP: Melinda Crutch   Brief Narrative:  75 y.o. male with recent prolonged extensive admission 05/31/15 through 07/31/15 for multiple problems including but not limited to septic shock due to Klebsiella bacteremia Pyelonephritis/UTI complicated by hemorrhagic shock due to coagulopathy with subsequent cardiac arrest, complicated by left pneumothorax following CPR s/p chest tube placement.  He was eventually discharged to SNF on 07/31/15; however, he returned [facility] ED 08/07/15 with fatigue, somnolence, fever. He was found to have UTI and was initially admitted to SDU as he had responded well to 59ml/kg bolus; however, he later developed hypotension along with significant third spacing. He was started on albumin and due to persistent hypotension, PCCM was asked to admit to ICU for septic shock.  Assessment & Plan:  Septic shock - due to Klebsiella UTI, LLL HCAP - on Imipenem, continue for total 14 days, stop date July 16,2017 - resolved but pt remains rather debilitated, bed bound   Hx A.fib (on warfarin) DVT - continue Coumadin per pharmacy  - no further invasive interventions per pt's request   Acute on chronic hypoxic respiratory failure, pt with hx of OSA on CPAP, hx of PE - Concern for HCAP. CXR with possible LLL infiltrate  - Continue nocturnal CPAP. - pulmicort BID and duoneb qid. - maintaining oxygen saturations at target range   ESRD  - per nephrology   Anemia of chronic disease, ESRD - no signs of bleeding, Hg overall stable  - CBC in AM  Stage 2 Pressure  - MASD (moisture associated skin damage) bilateral buttocks and posterior thigh - 3 small areas noted, superficial 1cm x 1cm x 0.1cm  - these areas are near the rectum, for that reason, would not recommend dressings because they will continue to get soiled -  recommend barrier cream instead of dressings to be applied each shift and after each episode of incontinence.  Hx hemorrhagic shock (due to right perinephric hematoma s/p embolization) with subsequent cardiac arrest - on Coumadin, INR therapeutic  - no indication for transfusion at this time   Acute encephalopathy likely related to sepsis - Clinically improved but still rather weak, mostly bed bound  - placement difficult as pt unable to participate in physical therapy   DNR - mental status clear this AM   Obesity  - Body mass index is 32.64 kg/(m^2).  Acute functional quadriplegia - in the setting of multiple and complex medical conditions, acute illness imposed on chronic illnesses - will need LTACH vs SNF - still bed bound   DVT prophylaxis: on coumadin  Code Status: DNR Family Communication: Patient at bedside, left message on wife's phone to call me back  Disposition Plan: LTAC not approved by insurance, will need SNF but placement is challenging as pt unable to participate in therapy   Consultants:   Nephrology   Palliative care   Procedures:   None   Antimicrobials:   Blood 07/02 > Kleb pna, Coag (-) staph in 1st bottle; (-) in 2nd bottle  Urine 07/02 > several species  Sputum 07/02 > no specimen  BC 7/4>>>  ANTIBIOTICS:  Vanc 07/02 >>>7/6  Cefepime 07/02 >>>7/3  Imipenem 7/3>>>7/16  Anidulo 7/3>>>off   Subjective: No events overnight.   Objective: Filed Vitals:   08/17/15 1000 08/17/15 1030 08/17/15 1045 08/17/15 1115  BP: 94/59  86/54 99/54 98/60   Pulse: 86 88 90 94  Temp:      TempSrc:      Resp:      Height:      Weight:      SpO2:        Intake/Output Summary (Last 24 hours) at 08/17/15 1157 Last data filed at 08/17/15 0600  Gross per 24 hour  Intake    720 ml  Output    370 ml  Net    350 ml   Filed Weights   08/15/15 1950 08/16/15 2045 08/17/15 0800  Weight: 103.193 kg (227 lb 8 oz) 103.2 kg (227 lb 8.2 oz) 103.8 kg (228  lb 13.4 oz)    Examination:  General exam: Appears calm and comfortable, chronically ill  Respiratory system: Respiratory effort normal. Diminished breath sounds at bases  Cardiovascular system: RRR. No JVD, rubs, gallops or clicks.  Gastrointestinal system: Abdomen is nondistended, soft and nontender. No organomegaly or masses felt.  Central nervous system: Alert and oriented. Bed bound, moving LE only left and right but not against gravity  Extremities: chronic bilateral LE venous stasis changes, + 1 edema   Data Reviewed: I have personally reviewed following labs and imaging studies  CBC:  Recent Labs Lab 08/12/15 0400 08/13/15 0823 08/14/15 1458 08/15/15 0500 08/16/15 0550 08/17/15 0334  WBC 4.5 6.8 7.0 6.0 5.1 5.1  NEUTROABS 3.2  --   --   --   --   --   HGB 9.0* 9.0* 9.1* 8.9* 8.5* 8.3*  HCT 29.9* 30.6* 30.0* 30.2* 27.8* 26.6*  MCV 91.4 91.1 90.6 89.9 90.0 89.3  PLT 225 244 235 250 233 0000000   Basic Metabolic Panel:  Recent Labs Lab 08/11/15 0530  08/12/15 0400 08/13/15 0815 08/14/15 1458 08/15/15 0500 08/16/15 0550 08/17/15 0334  NA 137  < > 136 135 132* 133* 135 135  K 4.3  < > 4.3 4.7 4.1 4.0 3.2* 3.5  CL 105  < > 104 102 100* 99* 101 100*  CO2 27  < > 27 26 23 25 28 27   GLUCOSE 95  < > 112* 103* 155* 108* 114* 102*  BUN 17  < > 16 34* 30* 40* 26* 41*  CREATININE 1.23  < > 1.34* 2.56* 2.48* 2.89* 2.31* 3.04*  CALCIUM 8.0*  < > 7.9* 8.3* 7.7* 8.0* 7.5* 7.9*  MG 2.3  --  2.2  --   --   --   --   --   PHOS 2.5  < > 2.3* 3.8 2.8 3.7 2.9  --   < > = values in this interval not displayed.  Coagulation Profile:  Recent Labs Lab 08/13/15 0609 08/14/15 1458 08/15/15 0500 08/16/15 0550 08/17/15 0334  INR 2.51* 2.76* 2.79* 3.02* 2.72*   CBG:  Recent Labs Lab 08/15/15 1952 08/16/15 0727 08/16/15 1143 08/16/15 1714 08/16/15 2040  GLUCAP 165* 109* 163* 117* 152*   Urine analysis:    Component Value Date/Time   COLORURINE YELLOW 08/07/2015 2139     APPEARANCEUR TURBID* 08/07/2015 2139   LABSPEC 1.019 08/07/2015 2139   PHURINE 8.0 08/07/2015 2139   GLUCOSEU NEGATIVE 08/07/2015 2139   HGBUR LARGE* 08/07/2015 2139   BILIRUBINUR SMALL* 08/07/2015 2139   [place] NEGATIVE 08/07/2015 2139   PROTEINUR 100* 08/07/2015 2139   UROBILINOGEN 0.2 03/06/2013 1748   NITRITE NEGATIVE 08/07/2015 2139   LEUKOCYTESUR LARGE* 08/07/2015 2139    Recent Results (from the past 240 hour(s))  Urine culture  Status: Abnormal   Collection Time: 08/07/15  8:12 PM  Result Value Ref Range Status   Specimen Description URINE, RANDOM  Final   Special Requests NONE  Final   Culture MULTIPLE SPECIES PRESENT, SUGGEST RECOLLECTION (A)  Final   Report Status 08/08/2015 FINAL  Final  Gram stain     Status: None   Collection Time: 08/07/15  8:12 PM  Result Value Ref Range Status   Specimen Description URINE, RANDOM  Final   Special Requests NONE  Final   Gram Stain   Final    WBC PRESENT, PREDOMINANTLY MONONUCLEAR GRAM NEGATIVE RODS BUDDING YEAST SEEN GRAM POSITIVE COCCI IN CHAINS CYTOSPIN SMEAR    Report Status 08/07/2015 FINAL  Final  Blood Culture (routine x 2)     Status: Abnormal   Collection Time: 08/07/15  8:18 PM  Result Value Ref Range Status   Specimen Description BLOOD BLOOD RIGHT FOREARM  Final   Special Requests BOTTLES DRAWN AEROBIC AND ANAEROBIC 5CC  Final   Culture  Setup Time   Final    GRAM NEGATIVE RODS ANAEROBIC BOTTLE ONLY Organism ID to follow CRITICAL RESULT CALLED TO, READ BACK BY AND VERIFIED WITH: N BATCHELDER 08/08/15 @ [place] IN [place] AEROBIC BOTTLE ONLY CRITICAL RESULT CALLED TO, READ BACK BY AND VERIFIED WITH: L POWELL PHARMD 1751 08/08/15 A BROWNING    Culture (A)  Final    KLEBSIELLA PNEUMONIAE Confirmed Extended Spectrum Beta-Lactamase Producer (ESBL) STAPHYLOCOCCUS SPECIES (COAGULASE NEGATIVE) THE SIGNIFICANCE OF ISOLATING THIS ORGANISM FROM A SINGLE SET OF BLOOD CULTURES WHEN  MULTIPLE SETS ARE DRAWN IS UNCERTAIN. PLEASE NOTIFY THE MICROBIOLOGY DEPARTMENT WITHIN ONE WEEK IF SPECIATION AND SENSITIVITIES ARE REQUIRED.    Report Status 08/11/2015 FINAL  Final   Organism ID, Bacteria KLEBSIELLA PNEUMONIAE  Final      Susceptibility   Klebsiella pneumoniae - MIC*    AMPICILLIN >=32 RESISTANT Resistant     CEFAZOLIN >=64 RESISTANT Resistant     CEFEPIME <=1 RESISTANT Resistant     CEFTAZIDIME 2 RESISTANT Resistant     CEFTRIAXONE 8 RESISTANT Resistant     CIPROFLOXACIN 1 SENSITIVE Sensitive     GENTAMICIN 8 INTERMEDIATE Intermediate     IMIPENEM <=0.25 SENSITIVE Sensitive     TRIMETH/SULFA <=20 SENSITIVE Sensitive     AMPICILLIN/SULBACTAM 8 SENSITIVE Sensitive     PIP/TAZO 8 SENSITIVE Sensitive     * KLEBSIELLA PNEUMONIAE  Blood Culture ID Panel (Reflexed)     Status: Abnormal   Collection Time: 08/07/15  8:18 PM  Result Value Ref Range Status   Enterococcus species NOT DETECTED NOT DETECTED Final   Vancomycin resistance NOT DETECTED NOT DETECTED Final   Listeria monocytogenes NOT DETECTED NOT DETECTED Final   Staphylococcus species DETECTED (A) NOT DETECTED Final    Comment: CRITICAL RESULT CALLED TO, READ BACK BY AND VERIFIED WITH: N BATCHELDER 08/08/15 @ 1249 M VESTAL    Staphylococcus aureus NOT DETECTED NOT DETECTED Final   Methicillin resistance DETECTED (A) NOT DETECTED Final    Comment: CRITICAL RESULT CALLED TO, READ BACK BY AND VERIFIED WITH: N BATCHELDER 08/08/15 @ 1249 M VESTAL    Streptococcus species NOT DETECTED NOT DETECTED Final   Streptococcus agalactiae NOT DETECTED NOT DETECTED Final   Streptococcus pneumoniae NOT DETECTED NOT DETECTED Final   Streptococcus pyogenes NOT DETECTED NOT DETECTED Final   Acinetobacter baumannii NOT DETECTED NOT DETECTED Final   Enterobacteriaceae species DETECTED (A) NOT DETECTED Final    Comment: CRITICAL  RESULT CALLED TO, READ BACK BY AND VERIFIED WITH: N BATCHELDER 08/08/15 @ 1249 M VESTAL    Enterobacter  cloacae complex NOT DETECTED NOT DETECTED Final   Escherichia coli NOT DETECTED NOT DETECTED Final   Klebsiella oxytoca NOT DETECTED NOT DETECTED Final   Klebsiella pneumoniae DETECTED (A) NOT DETECTED Final    Comment: CRITICAL RESULT CALLED TO, READ BACK BY AND VERIFIED WITH: N BATCHELDER 08/08/15 @ 1249 M VESTAL3    Proteus species NOT DETECTED NOT DETECTED Final   Serratia marcescens NOT DETECTED NOT DETECTED Final   Carbapenem resistance NOT DETECTED NOT DETECTED Final   Haemophilus influenzae NOT DETECTED NOT DETECTED Final   Neisseria meningitidis NOT DETECTED NOT DETECTED Final   Pseudomonas aeruginosa NOT DETECTED NOT DETECTED Final   Candida albicans NOT DETECTED NOT DETECTED Final   Candida glabrata NOT DETECTED NOT DETECTED Final   Candida krusei NOT DETECTED NOT DETECTED Final   Candida parapsilosis NOT DETECTED NOT DETECTED Final   Candida tropicalis NOT DETECTED NOT DETECTED Final  Blood Culture (routine x 2)     Status: None   Collection Time: 08/07/15  8:33 PM  Result Value Ref Range Status   Specimen Description BLOOD RIGHT ARM  Final   Special Requests BOTTLES DRAWN AEROBIC AND ANAEROBIC 5CC  Final   Culture NO GROWTH 6 DAYS  Final   Report Status 08/13/2015 FINAL  Final  Culture, blood (routine x 2)     Status: None   Collection Time: 08/09/15  2:25 PM  Result Value Ref Range Status   Specimen Description BLOOD RIGHT HAND  Final   Special Requests IN PEDIATRIC BOTTLE .5CC  Final   Culture NO GROWTH 5 DAYS  Final   Report Status 08/14/2015 FINAL  Final  Culture, blood (routine x 2)     Status: None   Collection Time: 08/09/15  2:32 PM  Result Value Ref Range Status   Specimen Description BLOOD RIGHT ANTECUBITAL  Final   Special Requests BOTTLES DRAWN AEROBIC ONLY 5CC  Final   Culture NO GROWTH 5 DAYS  Final   Report Status 08/14/2015 FINAL  Final      Radiology Studies: No results found.    Scheduled Meds: . antiseptic oral rinse  7 mL Mouth Rinse BID    . budesonide (PULMICORT) nebulizer solution  0.5 mg Nebulization BID  . darbepoetin (ARANESP) injection - DIALYSIS  100 mcg Intravenous Q Mon-HD  . famotidine  20 mg Oral Q1200  . feeding supplement (NEPRO CARB STEADY)  237 mL Oral BID BM  . feeding supplement (PRO-STAT SUGAR FREE 64)  30 mL Oral BID  . imipenem-cilastatin  250 mg Intravenous Q12H  . insulin aspart  0-15 Units Subcutaneous TID WC  . ipratropium-albuterol  3 mL Nebulization BID  . metoCLOPramide  5 mg Oral TID AC  . midodrine  10 mg Oral TID WC  . multivitamin  1 tablet Oral QHS  . nystatin  5 mL Oral QID  . polyethylene glycol  17 g Oral Daily  . senna-docusate  1 tablet Oral BID  . sodium chloride flush  3 mL Intravenous Q12H  . Warfarin - Pharmacist Dosing Inpatient   Does not apply q1800   Continuous Infusions:    LOS: 10 days   Time spent: 20 minutes   Faye Ramsay, MD [facility] Pager [phone]  If 7PM-7AM, please contact night-coverage [email or web address] Password [facility] 08/17/2015, 11:57 AM

## 2015-08-18 LAB — GLUCOSE, CAPILLARY
GLUCOSE-CAPILLARY: 105 mg/dL — AB (ref 65–99)
GLUCOSE-CAPILLARY: 172 mg/dL — AB (ref 65–99)
GLUCOSE-CAPILLARY: 142 mg/dL — AB (ref 65–99)
GLUCOSE-CAPILLARY: 142 mg/dL — AB (ref 65–99)

## 2015-08-18 LAB — RENAL FUNCTION PANEL
ALBUMIN: 1.6 g/dL — AB (ref 3.5–5.0)
ANION GAP: 8 (ref 5–15)
BUN: 25 mg/dL — AB (ref 6–20)
CHLORIDE: 100 mmol/L — AB (ref 101–111)
CO2: 29 mmol/L (ref 22–32)
Calcium: 8 mg/dL — ABNORMAL LOW (ref 8.9–10.3)
Creatinine, Ser: 2.29 mg/dL — ABNORMAL HIGH (ref 0.61–1.24)
GFR, EST AFRICAN AMERICAN: 31 mL/min — AB (ref >60)
GFR, EST NON AFRICAN AMERICAN: 27 mL/min — AB (ref >60)
Glucose, Bld: 96 mg/dL (ref 65–99)
PHOSPHORUS: 2.3 mg/dL — AB (ref 2.5–4.6)
POTASSIUM: 4 mmol/L (ref 3.5–5.1)
Sodium: 137 mmol/L (ref 135–145)

## 2015-08-18 LAB — CBC
HEMATOCRIT: 29.7 % — AB (ref 39.0–52.0)
HEMOGLOBIN: 8.7 g/dL — AB (ref 13.0–17.0)
MCH: 26.8 pg (ref 26.0–34.0)
MCHC: 29.3 g/dL — ABNORMAL LOW (ref 30.0–36.0)
MCV: 91.4 fL (ref 78.0–100.0)
Platelets: 310 10*3/uL (ref 150–400)
RBC: 3.25 MIL/uL — AB (ref 4.22–5.81)
RDW: 17.7 % — ABNORMAL HIGH (ref 11.5–15.5)
WBC: 5.8 10*3/uL (ref 4.0–10.5)

## 2015-08-18 LAB — PROTIME-INR
INR: 2.78 — AB (ref 0.00–1.49)
Prothrombin Time: 28.9 seconds — ABNORMAL HIGH (ref 11.6–15.2)

## 2015-08-18 MED ORDER — WARFARIN SODIUM 1 MG PO TABS
1.0000 mg | ORAL_TABLET | Freq: Once | ORAL | Status: AC
  Administered 2015-08-18: 1 mg via ORAL
  Filled 2015-08-18: qty 1

## 2015-08-18 MED ORDER — CIPROFLOXACIN HCL 500 MG PO TABS
500.0000 mg | ORAL_TABLET | ORAL | Status: DC
  Administered 2015-08-18 – 2015-08-22 (×5): 500 mg via ORAL
  Filled 2015-08-18 (×6): qty 1

## 2015-08-18 NOTE — Progress Notes (Addendum)
Pharmacy Antibiotic Note [name] is a 75 y.o. male admitted on 08/07/2015 that is currently on Primaxin for ESBL K. Pneumoniae bacteremia.    He was on CRRT, but has been transferred to HD and is tolerating full sessions.  Plan: - Primaxin 250mg  IV q12h  - Plan is for 14 days of therapy- Through 7/17 as day #1 was 7/3 - F/u on toleration of HD and adjust dose as needed    Height: 5\' 10"  (177.8 cm) Weight: 223 lb 5.2 oz (101.3 kg) IBW/kg (Calculated) : 73  Temp (24hrs), Avg:97.9 F (36.6 C), Min:97.3 F (36.3 C), Max:98.3 F (36.8 C)   Recent Labs Lab 08/14/15 1458 08/15/15 0500 08/16/15 0550 08/17/15 0334 08/18/15 0445  WBC 7.0 6.0 5.1 5.1 5.8  CREATININE 2.48* 2.89* 2.31* 3.04* 2.29*    Estimated Creatinine Clearance: 34.3 mL/min (by C-G formula based on Cr of 2.29).    No Known Allergies  Antimicrobials this admission: Zosyn 7/2 x 1 Vanc 7/2>>7/5 Cefepime 7/3 x1 Anidulafungin 7/3 x 1 - changed to diflucan but never given Primaxin 7/3>>(14d)  Dose adjustments this admission: 7/7- Primaxin changed to 250mg  IV q12h for HD dosing  Microbiology results: 7/2 BCx: 1/2 Kleb pneumo (sens to unasyn, cipro, imipenem, zosyn and bactrim) 7/2 UCx: mult species 7/4 BCx: neg  Thank you for allowing pharmacy to be a part of this patient's care.  Dierdre Harness, Cain [place], PharmD Clinical Pharmacy Resident [phone] (Pager) 08/18/2015 10:59 AM    I have seen this patient and discussed with Dr. Lenna Sciara Arminger. I agree with the plan/assessment as written above.  Soha Thorup D. Kaya Pottenger, PharmD, BCPS Clinical Pharmacist Pager: [phone] 08/18/2015 11:17 AM

## 2015-08-18 NOTE — Progress Notes (Signed)
Pt has lost peripheral iv access. Will notify md as pt wishes not to have another iv inserted.

## 2015-08-18 NOTE — Telephone Encounter (Signed)
LMTCB for [name] to f/u on this

## 2015-08-18 NOTE — Progress Notes (Signed)
Occupational Therapy Treatment Patient Details Name: [name] MRN: [id number] DOB: 1940-06-28 Today's Date: 08/18/2015    History of present illness Pt re-admitted 08/07/15 (after having being admitted from 4/25-6/25/2017) with fatigue, somnolence, fever. He was found to have UTI. Has been on CVVHD/CRRT until today (08/12/2015) with now looking to attempt if he can tolerate normal HD.   OT comments  Pt with excellent participation with therapy. Completed lateral scoot transfer to bed from chair with +2 Max A after being up in recliner for 4.5 hrs. Completed sit - stand transfers x 2 from elevated bed height with + 2 mod A. Pt given exercises to compete at bed level. Pt is motivated to get better and attempts all tasks. Pt needs extensive rehab and has excellent rehab potential. Recommend D/C to SNF for rehab.   Follow Up Recommendations  SNF;Supervision/Assistance - 24 hour    Equipment Recommendations  None recommended by OT (TBA at SNF)    Recommendations for Other Services      Precautions / Restrictions Precautions Precautions: Fall Type of Shoulder Precautions: RTC repair 04/2015 - per Cay Schillings 6/5 pt can use his RUE however he wants within his pain tolerance. Only self ROM (this was latest clarification from last admission) Precaution Comments: fragile skin Restrictions Weight Bearing Restrictions: No Other Position/Activity Restrictions: Self ROM Rt UE       Mobility Bed Mobility Overal bed mobility: +2 for physical assistance;Needs Assistance Bed Mobility: Supine to Sit       Sit to supine: Max assist;+2 for physical assistance      Transfers Overall transfer level: Needs assistance Equipment used: 2 person hand held assist   Sit to Stand: +2 physical assistance;Mod assist        Lateral/Scoot Transfers: +2 physical assistance;Max assist      Balance Overall balance assessment: Needs assistance   Sitting balance-Leahy Scale: Fair       Standing  balance-Leahy Scale: Poor                     ADL Overall ADL's : Needs assistance/impaired Eating/Feeding: Set up   Grooming: Set up;Sitting   Upper Body Bathing: Minimal assitance;Sitting   Lower Body Bathing: Maximal assistance;Sitting/lateral leans   Upper Body Dressing : Maximal assistance;Sitting   Lower Body Dressing: Total assistance;Bed level               Functional mobility during ADLs: +2 for physical assistance;Maximal assistance (lateral scoot; +2 sit -stand mod A from elevated surface) General ADL Comments: Pt leaned over while sitting in chair to rub lotio on lower legs while giveing a scapular stretch in protraction B      Vision                     Perception     Praxis      Cognition   Behavior During Therapy: [facility] for tasks assessed/performed Overall Cognitive Status: Within Functional Limits for tasks assessed                       Extremity/Trunk Assessment  Upper Extremity Assessment Upper Extremity Assessment: RUE deficits/detail;LUE deficits/detail RUE Deficits / Details: s/p R RTC repair 04/2015 (see precautions for clarification of movement allowed)--minimal AROM R shoulder with apparent RTC weakness. elbow/wrist and hand WFL. strength elbow wrist hand @ 3+/5.  LUE Deficits / Details: generalized weakness. scapular weakness. Able to achiecve full scapular ROM after mobilization. shoulder FF @ 0-80;  ER 0-30; Abd 0-50. strength elbow flex/ext 4/5. hand 4/5. shoudler 2/5   Lower Extremity Assessment Lower Extremity Assessment: Generalized weakness;Defer to PT evaluation   Cervical / Trunk Assessment Cervical / Trunk Assessment: Kyphotic    Exercises General Exercises - Upper Extremity Shoulder Flexion: AROM;AAROM;Left;10 reps;Seated Elbow Flexion: AROM;Left;10 reps;Seated Chair Push Up: Strengthening;Left;5 reps   Shoulder Instructions       General Comments      Pertinent Vitals/ Pain       Pain  Assessment: Faces Faces Pain Scale: Hurts little more Pain Location: all over Pain Descriptors / Indicators: Discomfort;Grimacing Pain Intervention(s): Limited activity within patient's tolerance  Home Living Family/patient expects to be discharged to:: Skilled nursing facility                                        Prior Functioning/Environment Level of Independence: Needs assistance  Gait / Transfers Assistance Needed: PTA, mod I. Has not ambulated in facility ADL's / Homemaking Assistance Needed: PTA independent. REquired A for all ADl at facility       Frequency Min 2X/week     Progress Toward Goals  OT Goals(current goals can now be found in the care plan section)     Acute Rehab OT Goals Patient Stated Goal: to get better OT Goal Formulation: With patient/family Time For Goal Achievement: 09/01/15 Potential to Achieve Goals: Good ADL Goals Pt Will Perform Grooming: with min guard assist;sitting Pt Will Perform Upper Body Bathing: with min assist;sitting Pt Will Transfer to Toilet: with mod assist;with +2 assist;stand pivot transfer;squat pivot transfer;bedside commode Pt/caregiver will Perform Home Exercise Program: Increased strength;Both right and left upper extremity;With minimal assist;With written HEP provided Additional ADL Goal #1: Pt will be able to roll left and right with Mod A to A with basic ADLs  Plan      Co-evaluation    PT/OT/SLP Co-Evaluation/Treatment: Yes Reason for Co-Treatment: Complexity of the patient's impairments (multi-system involvement);For patient/therapist safety   OT goals addressed during session: ADL's and self-care;Strengthening/ROM      End of Session Equipment Utilized During Treatment: Gait belt   Activity Tolerance Patient tolerated treatment well   Patient Left in bed;with call bell/phone within reach;with bed alarm set;with family/visitor present   Nurse Communication Mobility status        Time:  [id number] OT Time Calculation (min): 31 min  Charges: OT General Charges $OT Visit: 1 Procedure OT Treatments $Self Care/Home Management : 8-22 mins  Kalene Cutler,HILLARY 08/18/2015, 3:58 PM   [facility], OTR/L  [phone] 08/18/2015

## 2015-08-18 NOTE — Progress Notes (Signed)
Physical Therapy Treatment Patient Details Name: [name] MRN: [id number] DOB: 1940-05-16 Today's Date: 08/18/2015    History of Present Illness Pt re-admitted 08/07/15 (after having being admitted from 4/25-6/25/2017) with fatigue, somnolence, fever. He was found to have UTI. Has been on CVVHD/CRRT until today (08/12/2015) with now looking to attempt if he can tolerate normal HD.    PT Comments    Pt had a very good session today.  Pt had been up in bedside recliner for 4.5 hours and then performed lateral scoot transfer to the L with MAX of 2.  Once EOB, worked on sit <> stand with MOD A of 2 from elevated surface.  He met his sit > stand goal which will be updated.  Pt is motivated and was very happy about his performance today.  Pt is a good rehab candidate and has supportive family.  Recommend SNF for rehab.  Follow Up Recommendations  SNF     Equipment Recommendations  None recommended by PT    Recommendations for Other Services       Precautions / Restrictions Precautions Precautions: Fall Type of Shoulder Precautions: RTC repair 04/2015 - per Cay Schillings 6/5 pt can use his RUE however he wants within his pain tolerance. Only self ROM (this was latest clarification from last admission) Precaution Comments: fragile skin Restrictions Weight Bearing Restrictions: No Other Position/Activity Restrictions: Self ROM Rt UE    Mobility  Bed Mobility Overal bed mobility: +2 for physical assistance;Needs Assistance Bed Mobility: Supine to Sit       Sit to supine: Max assist;+2 for physical assistance      Transfers Overall transfer level: Needs assistance Equipment used: 2 person hand held assist Transfers: Lateral/Scoot Transfers Sit to Stand: +2 physical assistance;Mod assist        Lateral/Scoot Transfers: +2 physical assistance;Max assist General transfer comment: Lateral scoot transfer to the L with MAX A of 2 with cueing for using UE to A with transfer.  Sit to  stand with bariatric hold transfer with elevated bed and MOD A of 2.  Ambulation/Gait                 Stairs            Wheelchair Mobility    Modified Rankin (Stroke Patients Only)       Balance Overall balance assessment: Needs assistance   Sitting balance-Leahy Scale: Fair       Standing balance-Leahy Scale: Poor                      Cognition Arousal/Alertness: Awake/alert Behavior During Therapy: WFL for tasks assessed/performed Overall Cognitive Status: Within Functional Limits for tasks assessed                      Exercises General Exercises - Upper Extremity Shoulder Flexion: AROM;AAROM;Left;10 reps;Seated Elbow Flexion: AROM;Left;10 reps;Seated Chair Push Up: Strengthening;Left;5 reps General Exercises - Lower Extremity Long Arc Quad: AROM;Both;5 reps;Seated    General Comments        Pertinent Vitals/Pain Pain Assessment: Faces Faces Pain Scale: Hurts little more Pain Location: all over Pain Descriptors / Indicators: Discomfort;Grimacing Pain Intervention(s): Limited activity within patient's tolerance    Home Living Family/patient expects to be discharged to:: Skilled nursing facility                    Prior Function Level of Independence: Needs assistance  Gait / Transfers Assistance Needed: PTA, mod  I. Has not ambulated in facility ADL's / Homemaking Assistance Needed: PTA independent. REquired A for all ADl at facility     PT Goals (current goals can now be found in the care plan section) Acute Rehab PT Goals Patient Stated Goal: to get better PT Goal Formulation: With patient/family Time For Goal Achievement: 08/26/15 Potential to Achieve Goals: Fair Progress towards PT goals: Progressing toward goals;Goals met and updated - see care plan    Frequency  Min 2X/week    PT Plan Current plan remains appropriate    Co-evaluation PT/OT/SLP Co-Evaluation/Treatment: Yes Reason for Co-Treatment:  Complexity of the patient's impairments (multi-system involvement);For patient/therapist safety PT goals addressed during session: Mobility/safety with mobility OT goals addressed during session: ADL's and self-care;Strengthening/ROM     End of Session Equipment Utilized During Treatment: Gait belt Activity Tolerance: Patient tolerated treatment well Patient left: in bed;with call bell/phone within reach;with family/visitor present     Time: 3383-2919 PT Time Calculation (min) (ACUTE ONLY): 31 min  Charges:  $Therapeutic Activity: 8-22 mins                    G Codes:      Roger Fasnacht LUBECK 08/18/2015, 4:18 PM

## 2015-08-18 NOTE — Progress Notes (Signed)
Daily Progress Note   Patient Name: [name]       Date: 08/18/2015 DOB: [birth year]  Age: 75 y.o. MRN#: [id number] Attending Physician: Theodis Blaze, MD Primary Care Physician: Melinda Crutch Admit Date: 08/07/2015  Reason for Consultation/Follow-up: psycho social and spiritual support  Subjective:  Patient confident that he can sit up in a chair for 4 hours.  Also states he did not use CPAP in HD and does not understand why it is documented that he did.   Length of Stay: 11  Current Medications: Scheduled Meds:  . antiseptic oral rinse  7 mL Mouth Rinse BID  . budesonide (PULMICORT) nebulizer solution  0.5 mg Nebulization BID  . darbepoetin (ARANESP) injection - DIALYSIS  100 mcg Intravenous Q Mon-HD  . famotidine  20 mg Oral Q1200  . feeding supplement (NEPRO CARB STEADY)  237 mL Oral BID BM  . feeding supplement (PRO-STAT SUGAR FREE 64)  30 mL Oral BID  . imipenem-cilastatin  250 mg Intravenous Q12H  . insulin aspart  0-15 Units Subcutaneous TID WC  . ipratropium-albuterol  3 mL Nebulization BID  . metoCLOPramide  5 mg Oral TID AC  . midodrine  10 mg Oral TID WC  . multivitamin  1 tablet Oral QHS  . nystatin  5 mL Oral QID  . polyethylene glycol  17 g Oral Daily  . senna-docusate  1 tablet Oral BID  . sodium chloride flush  3 mL Intravenous Q12H  . Warfarin - Pharmacist Dosing Inpatient   Does not apply q1800    Continuous Infusions:    PRN Meds: fentaNYL (SUBLIMAZE) injection, HYDROcodone-acetaminophen, ipratropium-albuterol, ondansetron (ZOFRAN) IV, ondansetron **OR** [DISCONTINUED] ondansetron (ZOFRAN) IV, polyvinyl alcohol, simethicone  Physical Exam         Chronically ill appearing male, A&O x 3,   Wife at bedside.   CV irreg. resp NA Abdomen soft Extremities:   LEE is 2+, not tight   Vital Signs: BP 118/74 mmHg  Pulse 84  Temp(Src) 98.2 F (36.8 C) (Oral)  Resp 18  Ht 5\' 10"  (1.778 m)  Wt 101.3 kg (223 lb 5.2 oz)  BMI 32.04 kg/m2  SpO2 96% SpO2: SpO2: 96 % O2 Device: O2 Device: Not Delivered O2 Flow Rate: O2 Flow Rate (L/min): 1 L/min  Intake/output summary:   Intake/Output Summary (Last  24 hours) at 08/18/15 1029 Last data filed at 08/18/15 0900  Gross per 24 hour  Intake    800 ml  Output   2519 ml  Net  -1719 ml   LBM: Last BM Date: 08/17/15 Baseline Weight: Weight: 112.2 kg (247 lb 5.7 oz) Most recent weight: Weight: 101.3 kg (223 lb 5.2 oz)       Palliative Assessment/Data:    Flowsheet Rows        Most Recent Value   Intake Tab    Referral Department  Critical care   Unit at Time of Referral  ICU   Palliative Care Primary Diagnosis  Other (Comment)   Date Notified  08/08/15   Palliative Care Type  New Palliative care   Reason for referral  Clarify Goals of Care   Date of Admission  08/07/15   Date first seen by Palliative Care  08/08/15   # of days Palliative referral response time  0 Day(s)   # of days IP prior to Palliative referral  1   Clinical Assessment    Palliative Performance Scale Score  30%   Psychosocial & Spiritual Assessment    Palliative Care Outcomes    Patient/Family meeting held?  Yes      Patient Active Problem List   Diagnosis Date Noted  . Encounter for hospice care discussion   . Palliative care encounter   . Goals of care, counseling/discussion   . Severe sepsis ([place])   . CKD (chronic kidney disease)   . Pressure ulcer 08/08/2015  . Acute respiratory failure with hypoxia ([place])   . UTI (lower urinary tract infection)   . ESRD (end stage renal disease) ([place])   . Hypocalcemia   . Acute encephalopathy   . Chronic anemia   . Fecal occult blood test positive   . HCAP (healthcare-associated pneumonia)   . Sepsis ([place])   . Elevated lactic acid level   . Elevated troponin   .  Sepsis secondary to UTI ([place]) 08/07/2015  . Hypoalbuminemia 07/27/2015  . Pneumothorax   . Traumatic pneumothorax   . OSA on CPAP   . Acute on chronic renal failure ([place])   . Hemorrhagic shock   . S/p nephrectomy   . Sepsis due to Klebsiella ([place])   . OSA (obstructive sleep apnea)   . Chronic diastolic CHF (congestive heart failure) ([place])   . Cardiac arrest ([place])   . Acute deep vein thrombosis (DVT) of distal vein of right lower extremity ([place])   . Acute renal failure (ARF) ([place])   . Acute hypoxemic respiratory failure ([place])   . Acute back pain   . Bleeding   . Chest tube in place   . Perinephric hematoma   . Right flank pain   . Acute respiratory failure ([place])   . Bacteremia due to Klebsiella pneumoniae   . Secondary cardiomyopathy ([place])   . Chronic atrial fibrillation ([place])   . Uncontrolled type 2 diabetes mellitus with complication ([place])   . Dyspnea   . Acute renal failure ([place]) 06/02/2015  . Encounter for central line placement   . Septic shock ([place]) 05/31/2015  . Renal failure   . Urinary tract infectious disease   . Arterial hypotension   . Increased anion gap metabolic acidosis   . Hyponatremia   . Rotator cuff tear 05/03/2015  . Atrial fibrillation ([place]) 11/10/2014  . Obesity (BMI 30-39.9) 11/10/2014  . Type II or unspecified type diabetes mellitus with unspecified complication, uncontrolled 04/06/2013  .  Anemia 04/04/2013  . 1St degree AV block 03/07/2013  . Acute blood loss anemia 03/06/2013  . Acute-on-chronic kidney injury, baseline CKD stage 3 03/06/2013  . Hypotension, unspecified 03/06/2013  . Bradycardia 03/06/2013  . Perineal pain 03/06/2013  . Preoperative clearance 03/03/2013  . Gross hematuria 03/02/2013  . Severe obstructive sleep apnea 12/04/2012  . Morbid obesity ([place]) 12/04/2012  . Bilateral lower extremity edema 12/04/2012  . Venous insufficiency 12/04/2012  . History of nephrectomy, unilateral 12/04/2012  . Chronic anticoagulation  12/04/2012  . History of pulmonary embolism 12/04/2012  . Essential hypertension 12/04/2012  . Cardiac murmur 12/04/2012    Palliative Care Assessment & Plan   Patient Profile: 75 y.o. male with past medical history of prostate CA s/p radiation 2011 and RCC s/p left nephrectomy,chronic foley cath, sleep apnea on CPAP, prior DVT and saddle PE, CK-MB stage III, HTN, DM. Recent prolonged admission 05/31/2015-07/31/2014 with AKI, Klebsiella bacteremia, peritoneal right perinephric hematoma, cardiac arrest x 2, post CPR left pneumothorax requiring chest tube placement, intubated x 2, and also ESRD and initiated hemodialysis. Admitted on 08/07/2015 with septic shock d/t UTI vs HCAP.  Assessment: Tolerating HD as an inpatient on midodrine.  There is concern that he is  too debilitated to tolerate HD outpatient at this point.   He has incredible drive (I'm quite impressed he is alive).  He may well improve to the point where he will be a good SNF candidate.  I expect daily improvement from this patient.   Discussed with Dr. Jonnie Finner who recommended placing an order for the patient to get up to chair for 4 hours today.  Have asked the bedside RN to document the results.  Will return later to complete a MOST form with patient and family.  Code Status: DNR  Prognosis:   Unable to determine.  Discharge Planning:  Recommend D/C to SNF when appropriate.  Care plan was discussed with case manager and social work.  Thank you for allowing the Palliative Medicine Team to assist in the care of this patient.   Time In: 10:00 Time Out: 10:25 Total Time 25 Prolonged Time Billed no      Greater than 50%  of this time was spent counseling and coordinating care related to the above assessment and plan.  Imogene Burn, PA-C Palliative Medicine Pager: [phone]  Please contact Palliative Medicine Team phone at [phone] for questions and concerns.

## 2015-08-18 NOTE — Progress Notes (Addendum)
Patient ID: [name], male   DOB: 10/23/1940, 75 y.o.   MRN: [id number]    PROGRESS NOTE    [name]  [name] DOB: [birth year] DOA: 08/07/2015  PCP: Melinda Crutch   Brief Narrative:  75 y.o. male with recent prolonged extensive admission 05/31/15 through 07/31/15 for multiple problems including but not limited to septic shock due to Klebsiella bacteremia Pyelonephritis/UTI complicated by hemorrhagic shock due to coagulopathy with subsequent cardiac arrest, complicated by left pneumothorax following CPR s/p chest tube placement.  He was eventually discharged to SNF on 07/31/15; however, he returned to [facility] ED 08/07/15 with fatigue, somnolence, fever. He was found to have UTI and was initially admitted to SDU as he had responded well to 63ml/kg bolus; however, he later developed hypotension along with significant third spacing. He was started on albumin and due to persistent hypotension, PCCM was asked to admit to ICU for septic shock.  Assessment & Plan:  Septic shock - due to Klebsiella UTI, LLL HCAP - on Imipenem, continue for total 14 days, stop date July 16,2017 - resolved but pt remains rather debilitated, bed bound   Hx A.fib (on warfarin) DVT - continue Coumadin per pharmacy  - no further invasive interventions per pt's request  - can be off tele  Acute on chronic hypoxic respiratory failure, pt with hx of OSA on CPAP, hx of PE - Concern for HCAP. CXR with possible LLL infiltrate  - Continue nocturnal CPAP. - pulmicort BID and duoneb qid. - maintaining oxygen saturations at target range   ESRD  - per nephrology  - pt unable to tolerate HD in chair, not a candidate for outpatient HD - this makes placement challenging, please see discussion below   Anemia of chronic disease, ESRD - no signs of bleeding, Hg overall stable  - CBC in AM  Stage 2 Pressure  - MASD (moisture associated skin damage) bilateral buttocks and posterior thigh - 3 small areas noted,  superficial 1cm x 1cm x 0.1cm  - these areas are near the rectum, for that reason, would not recommend dressings because they will continue to get soiled - recommend barrier cream instead of dressings to be applied each shift and after each episode of incontinence.  Hx hemorrhagic shock (due to right perinephric hematoma s/p embolization) with subsequent cardiac arrest - on Coumadin, INR therapeutic  - no indication for transfusion at this time   Acute encephalopathy likely related to sepsis - Clinically improved but still rather weak, mostly bed bound  - placement difficult as pt unable to participate in physical therapy   DNR - mental status clear this AM   Obesity  - Body mass index is 32.64 kg/(m^2).  Acute functional quadriplegia - in the setting of multiple and complex medical conditions, acute illness imposed on chronic illnesses - still bed bound   DVT prophylaxis: on coumadin  Code Status: DNR Family Communication: Patient at bedside, left message on wife's phone to call me back  Disposition Plan: LTAC not approved by insurance, pt not a candidate for outpatient HD, there is one SNF center in [place] that would do HD in bed but pt does not want to go there. Other option is to have pt transported via stretcher to HD center in [place] but transportation is private pay. Called wife to discuss these options.   After speaking with wife, she wants [place] to try getting pt into the chair to try HD. She says pt has never had problems sitting in the  chair for HD. She also says that pt was already sitting in chair 4 and half hours today.   Consultants:   Nephrology   Palliative care   Procedures:   None   Antimicrobials:   Blood 07/02 > Kleb pna, Coag (-) staph in 1st bottle; (-) in 2nd bottle  Urine 07/02 > several species  Sputum 07/02 > no specimen  BC 7/4>>>  ANTIBIOTICS:  Vanc 07/02 >>>7/6  Cefepime 07/02 >>>7/3  Imipenem 7/3>>>7/16  Anidulo  7/3>>>off   Subjective: No events overnight.   Objective: Filed Vitals:   08/18/15 0406 08/18/15 0929 08/18/15 0930 08/18/15 1000  BP: 109/73   118/74  Pulse: 96   84  Temp: 98.3 F (36.8 C)   98.2 F (36.8 C)  TempSrc: Oral   Oral  Resp: 16   18  Height:      Weight:      SpO2: 98% 98% 98% 96%    Intake/Output Summary (Last 24 hours) at 08/18/15 1329 Last data filed at 08/18/15 0900  Gross per 24 hour  Intake    800 ml  Output    191 ml  Net    609 ml   Filed Weights   08/17/15 0800 08/17/15 1216 08/17/15 2057  Weight: 103.8 kg (228 lb 13.4 oz) 101.2 kg (223 lb 1.7 oz) 101.3 kg (223 lb 5.2 oz)    Examination:  General exam: Appears calm and comfortable, chronically ill  Respiratory system: Respiratory effort normal. Diminished breath sounds at bases  Cardiovascular system: RRR. No JVD, rubs, gallops or clicks.  Gastrointestinal system: Abdomen is nondistended, soft and nontender. No organomegaly or masses felt.  Central nervous system: Alert and oriented. Bed bound, moving LE only left and right but not against gravity  Extremities: chronic bilateral LE venous stasis changes, + 1 edema   Data Reviewed: I have personally reviewed following labs and imaging studies  CBC:  Recent Labs Lab 08/12/15 0400  08/14/15 1458 08/15/15 0500 08/16/15 0550 08/17/15 0334 08/18/15 0445  WBC 4.5  < > 7.0 6.0 5.1 5.1 5.8  NEUTROABS 3.2  --   --   --   --   --   --   HGB 9.0*  < > 9.1* 8.9* 8.5* 8.3* 8.7*  HCT 29.9*  < > 30.0* 30.2* 27.8* 26.6* 29.7*  MCV 91.4  < > 90.6 89.9 90.0 89.3 91.4  PLT 225  < > 235 250 233 254 310  < > = values in this interval not displayed. Basic Metabolic Panel:  Recent Labs Lab 08/12/15 0400 08/13/15 0815 08/14/15 1458 08/15/15 0500 08/16/15 0550 08/17/15 0334 08/18/15 0445  NA 136 135 132* 133* 135 135 137  K 4.3 4.7 4.1 4.0 3.2* 3.5 4.0  CL 104 102 100* 99* 101 100* 100*  CO2 27 26 23 25 28 27 29   GLUCOSE 112* 103* 155* 108*  114* 102* 96  BUN 16 34* 30* 40* 26* 41* 25*  CREATININE 1.34* 2.56* 2.48* 2.89* 2.31* 3.04* 2.29*  CALCIUM 7.9* 8.3* 7.7* 8.0* 7.5* 7.9* 8.0*  MG 2.2  --   --   --   --   --   --   PHOS 2.3* 3.8 2.8 3.7 2.9  --  2.3*    Coagulation Profile:  Recent Labs Lab 08/14/15 1458 08/15/15 0500 08/16/15 0550 08/17/15 0334 08/18/15 0445  INR 2.76* 2.79* 3.02* 2.72* 2.78*   CBG:  Recent Labs Lab 08/17/15 1246 08/17/15 1700 08/17/15 2041 08/18/15 0726 08/18/15  [place]   Urine analysis:    Component Value Date/Time   COLORURINE YELLOW 08/07/2015 2139   APPEARANCEUR TURBID* 08/07/2015 2139   LABSPEC 1.019 08/07/2015 2139   PHURINE 8.0 08/07/2015 2139   GLUCOSEU NEGATIVE 08/07/2015 2139   HGBUR LARGE* 08/07/2015 2139   BILIRUBINUR SMALL* 08/07/2015 2139   [place] NEGATIVE 08/07/2015 2139   PROTEINUR 100* 08/07/2015 2139   UROBILINOGEN 0.2 03/06/2013 1748   NITRITE NEGATIVE 08/07/2015 2139   LEUKOCYTESUR LARGE* 08/07/2015 2139    Recent Results (from the past 240 hour(s))  Culture, blood (routine x 2)     Status: None   Collection Time: 08/09/15  2:25 PM  Result Value Ref Range Status   Specimen Description BLOOD RIGHT HAND  Final   Special Requests IN PEDIATRIC BOTTLE .5CC  Final   Culture NO GROWTH 5 DAYS  Final   Report Status 08/14/2015 FINAL  Final  Culture, blood (routine x 2)     Status: None   Collection Time: 08/09/15  2:32 PM  Result Value Ref Range Status   Specimen Description BLOOD RIGHT ANTECUBITAL  Final   Special Requests BOTTLES DRAWN AEROBIC ONLY 5CC  Final   Culture NO GROWTH 5 DAYS  Final   Report Status 08/14/2015 FINAL  Final      Radiology Studies: No results found.    Scheduled Meds: . antiseptic oral rinse  7 mL Mouth Rinse BID  . budesonide (PULMICORT) nebulizer solution  0.5 mg Nebulization BID  . darbepoetin (ARANESP) injection - DIALYSIS  100 mcg Intravenous Q Mon-HD  . famotidine  20 mg Oral Q1200  .  feeding supplement (NEPRO CARB STEADY)  237 mL Oral BID BM  . feeding supplement (PRO-STAT SUGAR FREE 64)  30 mL Oral BID  . imipenem-cilastatin  250 mg Intravenous Q12H  . insulin aspart  0-15 Units Subcutaneous TID WC  . ipratropium-albuterol  3 mL Nebulization BID  . metoCLOPramide  5 mg Oral TID AC  . midodrine  10 mg Oral TID WC  . multivitamin  1 tablet Oral QHS  . nystatin  5 mL Oral QID  . polyethylene glycol  17 g Oral Daily  . senna-docusate  1 tablet Oral BID  . sodium chloride flush  3 mL Intravenous Q12H  . warfarin  1 mg Oral ONCE-1800  . Warfarin - Pharmacist Dosing Inpatient   Does not apply q1800   Continuous Infusions:    LOS: 11 days   Time spent: 20 minutes   Faye Ramsay, MD [facility] Pager [phone]  If 7PM-7AM, please contact night-coverage [email or web address] Password [email or web address] 08/18/2015, 1:29 PM

## 2015-08-18 NOTE — Progress Notes (Signed)
ANTICOAGULATION CONSULT NOTE - Follow Up Consult  Pharmacy Consult for warfarin Indication: h/o DVT/PE and afib  No Known Allergies  Patient Measurements: Height: 5\' 10"  (177.8 cm) Weight: 223 lb 5.2 oz (101.3 kg) IBW/kg (Calculated) : 73  Vital Signs: Temp: 98.2 F (36.8 C) (07/13 1000) Temp Source: Oral (07/13 1000) BP: 118/74 mmHg (07/13 1000) Pulse Rate: 84 (07/13 1000)  Labs:  Recent Labs  08/16/15 0550 08/17/15 0334 08/18/15 0445  HGB 8.5* 8.3* 8.7*  HCT 27.8* 26.6* 29.7*  PLT 233 254 310  LABPROT 30.7* 28.4* 28.9*  INR 3.02* 2.72* 2.78*  CREATININE 2.31* 3.04* 2.29*    Estimated Creatinine Clearance: 34.3 mL/min (by C-G formula based on Cr of 2.29).  Assessment: 43 yom on warfarin PTA for h/o DVT/PE and afib - warfarin had been on hold since 6/28 due to elevated INR.  INR elevated upon admission to hospital (4.75), last dose of coumadin PTA was 6/28.  INR today is 2.78, up yesterday from 2.72. PLTC up to 310 and Hgb 8.7.  Goal of Therapy:  INR 2-3 Monitor platelets by anticoagulation protocol: Yes   Plan:  Warfarin 1mg  po x1 tonight Daily INR Monitor s/sx of bleeding  Dierdre Harness, BS, PharmD Clinical Pharmacy Resident [phone] (Pager) 08/18/2015 10:58 AM

## 2015-08-18 NOTE — Progress Notes (Signed)
[facility] Progress Note  Assessment: 1. Klebsiella sepsis/ UTI - on primaxin 2. Vol overload/ pulm edema - improved; cont to lower volume as tol 3. ESRD -  MWF hd 4. 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 Nutrition alb 1.5 - liberalize diet to carb mod - to help with P support/intake - K not an issue at present; on renavits/prostat 8. Chronic coumadin - afib hx DVT, INR 2.6 9. DNR - palliative care helping 10. Debility - pt up in chair today, hoping he can get back to SNF, will need to be able to tolerate transfers and HD in a chair   Plan - HD Montez Hageman MD [place] pager [phone]    cell [phone] 08/18/2015, 12:13 PM        Subjective:   No c/o , up in chair  Objective Filed Vitals:   08/18/15 0406 08/18/15 0929 08/18/15 0930 08/18/15 1000  BP: 109/73   118/74  Pulse: 96   84  Temp: 98.3 F (36.8 C)   98.2 F (36.8 C)  TempSrc: Oral   Oral  Resp: 16   18  Height:      Weight:      SpO2: 98% 98% 98% 96%   Physical Exam General: NAD but ill appearing Heart: RRR Lungs: no rales Abdomen: soft NT Extremities: 1 + LE edema with some dependent edema/thighs/buttocks Dialysis Access: left AVF maturing right IJ  Dialysis Orders: [facility] MWF 4h 109.5kg 3K/2.25 bath Hep none R IJ cath/ LUA AVF inserted 6/19 (maturing) Mircera 75 ug not started yet at OP unit tsat 22%, ferr 1958 Phos 3.7 pth 64  Additional Objective Labs: Basic Metabolic Panel:  Recent Labs Lab 08/15/15 0500 08/16/15 0550 08/17/15 0334 08/18/15 0445  NA 133* 135 135 137  K 4.0 3.2* 3.5 4.0  CL 99* 101 100* 100*  CO2 25 28 27 29   GLUCOSE 108* 114* 102* 96  BUN 40* 26* 41* 25*  CREATININE 2.89* 2.31* 3.04* 2.29*  CALCIUM 8.0* 7.5* 7.9* 8.0*  PHOS 3.7 2.9  --  2.3*   Liver Function  Tests:  Recent Labs Lab 08/15/15 0500 08/16/15 0550 08/18/15 0445  ALBUMIN 1.7* 1.5* 1.6*   CBC:  Recent Labs Lab 08/12/15 0400  08/14/15 1458 08/15/15 0500 08/16/15 0550 08/17/15 0334 08/18/15 0445  WBC 4.5  < > 7.0 6.0 5.1 5.1 5.8  NEUTROABS 3.2  --   --   --   --   --   --   HGB 9.0*  < > 9.1* 8.9* 8.5* 8.3* 8.7*  HCT 29.9*  < > 30.0* 30.2* 27.8* 26.6* 29.7*  MCV 91.4  < > 90.6 89.9 90.0 89.3 91.4  PLT 225  < > 235 250 233 254 310  < > = values in this interval not displayed. Blood Culture    Component Value Date/Time   SDES BLOOD RIGHT ANTECUBITAL 08/09/2015 1432   SPECREQUEST BOTTLES DRAWN AEROBIC ONLY 5CC 08/09/2015 1432   CULT NO GROWTH 5 DAYS 08/09/2015 1432   REPTSTATUS 08/14/2015 FINAL 08/09/2015 1432   CBG:  Recent Labs Lab 08/17/15 1246 08/17/15 1700 08/17/15 2041 08/18/15 0726 08/18/15 1203  GLUCAP 59* 149* 85 105* 172*   Lab Results  Component Value Date   INR 2.78* 08/18/2015   INR  2.72* 08/17/2015   INR 3.02* 08/16/2015   Studies/Results: No results found. Medications:   . antiseptic oral rinse  7 mL Mouth Rinse BID  . budesonide (PULMICORT) nebulizer solution  0.5 mg Nebulization BID  . darbepoetin (ARANESP) injection - DIALYSIS  100 mcg Intravenous Q Mon-HD  . famotidine  20 mg Oral Q1200  . feeding supplement (NEPRO CARB STEADY)  237 mL Oral BID BM  . feeding supplement (PRO-STAT SUGAR FREE 64)  30 mL Oral BID  . imipenem-cilastatin  250 mg Intravenous Q12H  . insulin aspart  0-15 Units Subcutaneous TID WC  . ipratropium-albuterol  3 mL Nebulization BID  . metoCLOPramide  5 mg Oral TID AC  . midodrine  10 mg Oral TID WC  . multivitamin  1 tablet Oral QHS  . nystatin  5 mL Oral QID  . polyethylene glycol  17 g Oral Daily  . senna-docusate  1 tablet Oral BID  . sodium chloride flush  3 mL Intravenous Q12H  . warfarin  1 mg Oral ONCE-1800  . Warfarin - Pharmacist Dosing Inpatient   Does not apply [phone]

## 2015-08-19 DIAGNOSIS — A419 Sepsis, unspecified organism: Secondary | ICD-10-CM

## 2015-08-19 DIAGNOSIS — N185 Chronic kidney disease, stage 5: Secondary | ICD-10-CM

## 2015-08-19 DIAGNOSIS — J189 Pneumonia, unspecified organism: Secondary | ICD-10-CM

## 2015-08-19 LAB — RENAL FUNCTION PANEL
ANION GAP: 11 (ref 5–15)
Albumin: 1.5 g/dL — ABNORMAL LOW (ref 3.5–5.0)
BUN: 37 mg/dL — ABNORMAL HIGH (ref 6–20)
CALCIUM: 8.2 mg/dL — AB (ref 8.9–10.3)
CHLORIDE: 97 mmol/L — AB (ref 101–111)
CO2: 26 mmol/L (ref 22–32)
CREATININE: 3.13 mg/dL — AB (ref 0.61–1.24)
GFR, EST AFRICAN AMERICAN: 21 mL/min — AB (ref >60)
GFR, EST NON AFRICAN AMERICAN: 18 mL/min — AB (ref >60)
Glucose, Bld: 83 mg/dL (ref 65–99)
Phosphorus: 2.6 mg/dL (ref 2.5–4.6)
Potassium: 4.2 mmol/L (ref 3.5–5.1)
Sodium: 134 mmol/L — ABNORMAL LOW (ref 135–145)

## 2015-08-19 LAB — PROTIME-INR
INR: 2.92 — AB (ref 0.00–1.49)
Prothrombin Time: 30 seconds — ABNORMAL HIGH (ref 11.6–15.2)

## 2015-08-19 LAB — CBC
HEMATOCRIT: 27.6 % — AB (ref 39.0–52.0)
HEMOGLOBIN: 8.5 g/dL — AB (ref 13.0–17.0)
MCH: 27.8 pg (ref 26.0–34.0)
MCHC: 30.8 g/dL (ref 30.0–36.0)
MCV: 90.2 fL (ref 78.0–100.0)
PLATELETS: 274 10*3/uL (ref 150–400)
RBC: 3.06 MIL/uL — ABNORMAL LOW (ref 4.22–5.81)
RDW: 17.9 % — ABNORMAL HIGH (ref 11.5–15.5)
WBC: 8 10*3/uL (ref 4.0–10.5)

## 2015-08-19 LAB — GLUCOSE, CAPILLARY
GLUCOSE-CAPILLARY: 97 mg/dL (ref 65–99)
GLUCOSE-CAPILLARY: 182 mg/dL — AB (ref 65–99)
GLUCOSE-CAPILLARY: 89 mg/dL (ref 65–99)
GLUCOSE-CAPILLARY: 112 mg/dL — AB (ref 65–99)

## 2015-08-19 MED ORDER — WARFARIN SODIUM 1 MG PO TABS
1.0000 mg | ORAL_TABLET | Freq: Once | ORAL | Status: AC
  Administered 2015-08-19: 1 mg via ORAL
  Filled 2015-08-19: qty 1

## 2015-08-19 MED ORDER — PENTAFLUOROPROP-TETRAFLUOROETH EX AERO: 1.0000 "application " | INHALATION_SPRAY | CUTANEOUS | Status: DC | PRN

## 2015-08-19 MED ORDER — LIDOCAINE-PRILOCAINE 2.5-2.5 % EX CREA: 1.0000 "application " | TOPICAL_CREAM | CUTANEOUS | Status: DC | PRN

## 2015-08-19 MED ORDER — LIDOCAINE HCL (PF) 1 % IJ SOLN: 5.0000 mL | INTRAMUSCULAR | Status: DC | PRN

## 2015-08-19 MED ORDER — ALTEPLASE 2 MG IJ SOLR: 2.0000 mg | Freq: Once | INTRAMUSCULAR | Status: DC | PRN

## 2015-08-19 MED ORDER — ALBUMIN HUMAN 25 % IV SOLN
INTRAVENOUS | Status: AC
  Filled 2015-08-19: qty 100

## 2015-08-19 MED ORDER — ALBUMIN HUMAN 25 % IV SOLN
25.0000 g | Freq: Once | INTRAVENOUS | Status: AC
  Administered 2015-08-19: 25 g via INTRAVENOUS
  Filled 2015-08-19: qty 100

## 2015-08-19 MED ORDER — SODIUM CHLORIDE 0.9 % IV SOLN: 100.0000 mL | INTRAVENOUS | Status: DC | PRN

## 2015-08-19 NOTE — Clinical Social Work Note (Addendum)
CSW contacted Murray Hodgkins, Engineer, site with [facility] in [place], [place] [phone]) regarding patient and his debility and need for stretcher dialysis. She requested that clinicals be transmitted to facility [phone])  for their review and consideration. Clinicals transmitted to facility and CSW will continue to follow and assist with discharge planning.  Jammie Troup Givens, MSW, LCSW Licensed Clinical Social Worker [place] [phone]

## 2015-08-19 NOTE — Progress Notes (Signed)
Daily Progress Note   Patient Name: [name]       Date: 08/19/2015 DOB: 11/13/1940  Age: 75 y.o. MRN#: [id number] Attending Physician: Hosie Poisson, MD Primary Care Physician: Melinda Crutch Admit Date: 08/07/2015  Reason for Consultation/Follow-up: psycho social and spiritual support  Subjective:  Visited [name] in HD.  He was sitting in the recliner, comfortable. Appears to be tolerating it well.  He has no complaints.  States he had a big meal to day and it was good.   He asked me for information on next steps.  I told him he would likely to go a SNF and as long as he could travel to and from and tolerate outpatient HD it would most likely be a local SNF.  He told me his next step was to graduate from needing HD.   Length of Stay: 12  Current Medications: Scheduled Meds:  . antiseptic oral rinse  7 mL Mouth Rinse BID  . budesonide (PULMICORT) nebulizer solution  0.5 mg Nebulization BID  . ciprofloxacin  500 mg Oral Q24H  . darbepoetin (ARANESP) injection - DIALYSIS  100 mcg Intravenous Q Mon-HD  . famotidine  20 mg Oral Q1200  . feeding supplement (NEPRO CARB STEADY)  237 mL Oral BID BM  . feeding supplement (PRO-STAT SUGAR FREE 64)  30 mL Oral BID  . insulin aspart  0-15 Units Subcutaneous TID WC  . ipratropium-albuterol  3 mL Nebulization BID  . metoCLOPramide  5 mg Oral TID AC  . midodrine  10 mg Oral TID WC  . multivitamin  1 tablet Oral QHS  . nystatin  5 mL Oral QID  . polyethylene glycol  17 g Oral Daily  . senna-docusate  1 tablet Oral BID  . sodium chloride flush  3 mL Intravenous Q12H  . warfarin  1 mg Oral ONCE-1800  . Warfarin - Pharmacist Dosing Inpatient   Does not apply q1800    Continuous Infusions:    PRN Meds: sodium chloride, sodium chloride,  alteplase, fentaNYL (SUBLIMAZE) injection, HYDROcodone-acetaminophen, ipratropium-albuterol, lidocaine (PF), lidocaine-prilocaine, ondansetron (ZOFRAN) IV, ondansetron **OR** [DISCONTINUED] ondansetron (ZOFRAN) IV, pentafluoroprop-tetrafluoroeth, polyvinyl alcohol, simethicone  Physical Exam         Well developed, A&O x 3, pleasant, NAD. CV irreg. resp no increased work of breathing. Abdomen soft Extremities:  LEE  is 2+, not tight   Vital Signs: BP 125/64 mmHg  Pulse 91  Temp(Src) 98.1 F (36.7 C) (Oral)  Resp 18  Ht 5\' 10"  (1.778 m)  Wt 102.513 kg (226 lb)  BMI 32.43 kg/m2  SpO2 98% SpO2: SpO2: 98 % O2 Device: O2 Device: Not Delivered O2 Flow Rate: O2 Flow Rate (L/min): 1 L/min  Intake/output summary:   Intake/Output Summary (Last 24 hours) at 08/19/15 1611 Last data filed at 08/19/15 0900  Gross per 24 hour  Intake    600 ml  Output    200 ml  Net    400 ml   LBM: Last BM Date: 08/18/15 Baseline Weight: Weight: 112.2 kg (247 lb 5.7 oz) Most recent weight: Weight: 102.513 kg (226 lb)       Palliative Assessment/Data:    Flowsheet Rows        Most Recent Value   Intake Tab    Referral Department  Critical care   Unit at Time of Referral  ICU   Palliative Care Primary Diagnosis  Other (Comment)   Date Notified  08/08/15   Palliative Care Type  New Palliative care   Reason for referral  Clarify Goals of Care   Date of Admission  08/07/15   Date first seen by Palliative Care  08/08/15   # of days Palliative referral response time  0 Day(s)   # of days IP prior to Palliative referral  1   Clinical Assessment    Palliative Performance Scale Score  50%   Psychosocial & Spiritual Assessment    Palliative Care Outcomes    Patient/Family meeting held?  Yes      Patient Active Problem List   Diagnosis Date Noted  . Encounter for hospice care discussion   . Palliative care encounter   . Goals of care, counseling/discussion   . Severe sepsis ([place])   . CKD  (chronic kidney disease)   . Pressure ulcer 08/08/2015  . Acute respiratory failure with hypoxia ([place])   . UTI (lower urinary tract infection)   . ESRD (end stage renal disease) ([place])   . Hypocalcemia   . Acute encephalopathy   . Chronic anemia   . Fecal occult blood test positive   . HCAP (healthcare-associated pneumonia)   . Sepsis ([place])   . Elevated lactic acid level   . Elevated troponin   . Sepsis secondary to UTI ([place]) 08/07/2015  . Hypoalbuminemia 07/27/2015  . Pneumothorax   . Traumatic pneumothorax   . OSA on CPAP   . Acute on chronic renal failure ([place])   . Hemorrhagic shock   . S/p nephrectomy   . Sepsis due to Klebsiella ([place])   . OSA (obstructive sleep apnea)   . Chronic diastolic CHF (congestive heart failure) ([place])   . Cardiac arrest ([place])   . Acute deep vein thrombosis (DVT) of distal vein of right lower extremity ([place])   . Acute renal failure (ARF) ([place])   . Acute hypoxemic respiratory failure ([place])   . Acute back pain   . Bleeding   . Chest tube in place   . Perinephric hematoma   . Right flank pain   . Acute respiratory failure ([place])   . Bacteremia due to Klebsiella pneumoniae   . Secondary cardiomyopathy ([place])   . Chronic atrial fibrillation ([place])   . Uncontrolled type 2 diabetes mellitus with complication ([place])   . Dyspnea   . Acute renal failure ([place]) 06/02/2015  . Encounter for central  line placement   . Septic shock ([place]) 05/31/2015  . Renal failure   . Urinary tract infectious disease   . Arterial hypotension   . Increased anion gap metabolic acidosis   . Hyponatremia   . Rotator cuff tear 05/03/2015  . Atrial fibrillation ([place]) 11/10/2014  . Obesity (BMI 30-39.9) 11/10/2014  . Type II or unspecified type diabetes mellitus with unspecified complication, uncontrolled 04/06/2013  . Anemia 04/04/2013  . 1St degree AV block 03/07/2013  . Acute blood loss anemia 03/06/2013  . Acute-on-chronic kidney injury, baseline CKD stage 3 03/06/2013  .  Hypotension, unspecified 03/06/2013  . Bradycardia 03/06/2013  . Perineal pain 03/06/2013  . Preoperative clearance 03/03/2013  . Gross hematuria 03/02/2013  . Severe obstructive sleep apnea 12/04/2012  . Morbid obesity ([place]) 12/04/2012  . Bilateral lower extremity edema 12/04/2012  . Venous insufficiency 12/04/2012  . History of nephrectomy, unilateral 12/04/2012  . Chronic anticoagulation 12/04/2012  . History of pulmonary embolism 12/04/2012  . Essential hypertension 12/04/2012  . Cardiac murmur 12/04/2012    Palliative Care Assessment & Plan   Patient Profile: 75 y.o. male with past medical history of prostate CA s/p radiation 2011 and RCC s/p left nephrectomy,chronic foley cath, sleep apnea on CPAP, prior DVT and saddle PE, CK-MB stage III, HTN, DM. Recent prolonged admission 05/31/2015-07/31/2014 with AKI, Klebsiella bacteremia, peritoneal right perinephric hematoma, cardiac arrest x 2, post CPR left pneumothorax requiring chest tube placement, intubated x 2, and also ESRD and initiated hemodialysis. Admitted on 08/07/2015 with septic shock d/t UTI vs HCAP.  He was placed on pressors and CRRT in the ICU.  Blood cultures showed ESBL Klebsiella Pnuemoniae.   He was treated with antibiotics, is now off CRRT and tolerating hemo dialysis.  He does still require midodrine to maintain his blood pressure in HD.  Assessment:  Tolerating HD in a recliner.  Hopeful for D/C to local SNF for intensive rehab.  Code Status: DNR  Prognosis:   Unable to determine.  Discharge Planning:  Recommend D/C to SNF when appropriate.  Care plan was discussed with case manager and social work.  Thank you for allowing the Palliative Medicine Team to assist in the care of this patient.   Time In: 3:50 Time Out: 4:17 Total Time 25 Prolonged Time Billed no      Greater than 50%  of this time was spent counseling and coordinating care related to the above assessment and plan.  Imogene Burn,  PA-C Palliative Medicine Pager: [phone]  Please contact Palliative Medicine Team phone at [phone] for questions and concerns.

## 2015-08-19 NOTE — Progress Notes (Signed)
SLP Cancellation Note  Patient Details Name: [name] MRN: [id number] DOB: 1940-10-09   Cancelled treatment:       Reason Eval/Treat Not Completed: Patient at procedure or test/unavailable (HD). Will continue efforts. 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 Unclear if pt has been seen by ENT yet.    Germain Osgood, M.A. CCC-SLP [phone]  Germain Osgood 08/19/2015, 2:50 PM

## 2015-08-19 NOTE — Progress Notes (Signed)
Pt is sleeping comfortably at this time. Pt instructed me during 1st rounds that if he was sleeping not to bother him for his QHS NIV.

## 2015-08-19 NOTE — Progress Notes (Signed)
[facility] Progress Note  Assessment/Plan: 1. Klebsiella sepsis/ UTI -primaxin changed to cipro 2. Vol overload/ pulm edema - improved; cont to lower volume as tol-edema is peripheral; net UF 2328 on Wed with post wt 101.3 3. ESRD - MWF -HD today in a chair-  4. 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 Nutrition alb 1.5 - liberalize diet to carb mod - to help with P support/intake - K not an issue at present; on renavits/prostat 8. Chronic coumadin - afib hx DVT, INR 2.9 9. DNR - palliative care helping 10. Debility - pt up in chair today, hoping he can get back to SNF, will need to be able to tolerate transfers and HD in a chair- chck this out with HD today  Myriam Jacobson, PA-C [place] [phone] 08/19/2015,10:28 AM  LOS: 12 days   Subjective:   No c/o  Objective Filed Vitals:   08/18/15 2122 08/19/15 0559 08/19/15 0743 08/19/15 0751  BP:  104/71    Pulse:  93    Temp:  97.9 F (36.6 C)    TempSrc:      Resp:  18    Height:      Weight:      SpO2: 99% 97% 97% 97%   Physical Exam General: NAD supine - sats good on room air Heart: RRR 2/6 murmur Lungs: grossly clear Abdomen: soft NT Extremities: some dependent edema + LE edema pitting Dialysis Access: left AVF + bruit maturing using right IJ  Dialysis Orders: [place] MWF 4h 109.5kg 3K/2.25 bath Hep none R IJ cath/ LUA AVF inserted 6/19 (maturing) Mircera 75 ug not started yet at OP unit tsat 22%, ferr 1958 Phos 3.7 pth 64  Additional Objective Labs: Basic Metabolic Panel:  Recent Labs Lab 08/16/15 0550 08/17/15 0334 08/18/15 0445 08/19/15 0338  NA 135 135 137 134*  K 3.2* 3.5 4.0 4.2  CL 101 100* 100* 97*  CO2 28 27 29 26   GLUCOSE 114* 102* 96 83  BUN 26* 41* 25* 37*  CREATININE 2.31* 3.04* 2.29* 3.13*  CALCIUM 7.5* 7.9* 8.0* 8.2*  PHOS 2.9  --  2.3* 2.6   Liver  Function Tests:  Recent Labs Lab 08/16/15 0550 08/18/15 0445 08/19/15 0338  ALBUMIN 1.5* 1.6* 1.5*   CBC:  Recent Labs Lab 08/15/15 0500 08/16/15 0550 08/17/15 0334 08/18/15 0445 08/19/15 0338  WBC 6.0 5.1 5.1 5.8 8.0  HGB 8.9* 8.5* 8.3* 8.7* 8.5*  HCT 30.2* 27.8* 26.6* 29.7* 27.6*  MCV 89.9 90.0 89.3 91.4 90.2  PLT 250 233 254 310 274  CBG:  Recent Labs Lab 08/18/15 0726 08/18/15 1203 08/18/15 1809 08/18/15 2021 08/19/15 0742  GLUCAP 105* 172* 142* 142* 97    Lab Results  Component Value Date   INR 2.92* 08/19/2015   INR 2.78* 08/18/2015   INR 2.72* 08/17/2015   Studies/Results: No results found. Medications:   . antiseptic oral rinse  7 mL Mouth Rinse BID  . budesonide (PULMICORT) nebulizer solution  0.5 mg Nebulization BID  . ciprofloxacin  500 mg Oral Q24H  . darbepoetin (ARANESP) injection - DIALYSIS  100 mcg Intravenous Q Mon-HD  . famotidine  20 mg Oral Q1200  . feeding supplement (NEPRO CARB STEADY)  237 mL Oral BID BM  . feeding supplement (PRO-STAT SUGAR FREE 64)  30 mL Oral BID  . insulin aspart  0-15 Units Subcutaneous TID WC  . ipratropium-albuterol  3 mL Nebulization BID  . metoCLOPramide  5 mg Oral TID AC  . midodrine  10 mg Oral TID WC  . multivitamin  1 tablet Oral QHS  . nystatin  5 mL Oral QID  . polyethylene glycol  17 g Oral Daily  . senna-docusate  1 tablet Oral BID  . sodium chloride flush  3 mL Intravenous Q12H  . Warfarin - Pharmacist Dosing Inpatient   Does not apply [phone]

## 2015-08-19 NOTE — Progress Notes (Signed)
Patient ID: [name], male   DOB: [birth year], 75 y.o.   MRN: [id number]    PROGRESS NOTE    JASCHA BENCE  [name] DOB: 1941-01-01 DOA: 08/07/2015  PCP: Melinda Crutch   Brief Narrative:  75 y.o. male with recent prolonged extensive admission 05/31/15 through 07/31/15 for multiple problems including but not limited to septic shock due to Klebsiella bacteremia Pyelonephritis/UTI complicated by hemorrhagic shock due to coagulopathy with subsequent cardiac arrest, complicated by left pneumothorax following CPR s/p chest tube placement.  He was eventually discharged to SNF on 07/31/15; however, he returned to [facility] ED 08/07/15 with fatigue, somnolence, fever. He was found to have UTI and was initially admitted to SDU as he had responded well to 48ml/kg bolus; however, he later developed hypotension along with significant third spacing. He was started on albumin and due to persistent hypotension, PCCM was asked to admit to ICU for septic shock. Later he was transferred to hospitalist service for further management.    Assessment & Plan:  Septic shock - due to Klebsiella UTI, LLL HCAP. - on Imipenem, continue for total 14 days, stop date July 16,2017 - resolved but pt remains rather debilitated, bed bound . - continue with PT, probably at SNF on discharge.  - he remains afebrile and no leukocytosis.   Hx A.fib (on warfarin) DVT - continue Coumadin per pharmacy  - no further invasive interventions per pt's request  - can be off tele - pt 'S INR is therapeutic.     Acute on chronic hypoxic respiratory failure, pt with hx of OSA on CPAP, hx of PE - Concern for HCAP. CXR with possible LLL infiltrate  - Continue nocturnal CPAP. - pulmicort BID and duoneb qid. - maintaining oxygen saturations at target range. - complete the course of antibiotics.   ESRD  - per nephrology  - initially pt unable to tolerate HD in chair, but patient and wife adamant that he can, and it was a wrong  assumption, so today he is going to attempt [facility] n chair and prove that he can do it.  - further recommendations as per nephrology .  Anemia of chronic disease, ESRD - no signs of bleeding, Hg overall stable    Stage 2 Pressure  - MASD (moisture associated skin damage) bilateral buttocks and posterior thigh - 3 small areas noted, superficial 1cm x 1cm x 0.1cm  - these areas are near the rectum, for that reason, would not recommend dressings because they will continue to get soiled - recommend barrier cream instead of dressings to be applied each shift and after each episode of incontinence. - no change in management.   Hx hemorrhagic shock (due to right perinephric hematoma s/p embolization) with subsequent cardiac arrest - on Coumadin, INR therapeutic  - no indication for transfusion at this time   Acute encephalopathy likely related to sepsis - Clinically improved but still rather weak, mostly bed bound  -  CIR recommended SNF on discharge.   Obesity  - Body mass index is 32.64 kg/(m^2).  Acute functional quadriplegia - in the setting of multiple and complex medical conditions, acute illness imposed on chronic illnesses - CIR recommended snf.   DVT prophylaxis: on coumadin  Code Status: DNR Family Communication: discussed with wife at bedside.  Disposition Plan: LTAC not approved by insurance,CIR deferred to SNF.  Plan for SNF when he completes HD session in the chair.    Consultants:   Nephrology   Palliative care   Procedures:  None   Antimicrobials:   Blood 07/02 > Kleb pna, Coag (-) staph in 1st bottle; (-) in 2nd bottle  Urine 07/02 > several species  Sputum 07/02 > no specimen  BC 7/4>>> negative so far.   ANTIBIOTICS:  Vanc 07/02 >>>7/6  Cefepime 07/02 >>>7/3  Imipenem 7/3>>>7/16  Anidulo 7/3>>>off   Subjective: No events overnight. Denies any new complaints.   Objective: Filed Vitals:   08/19/15 1430 08/19/15 1443 08/19/15 1500  08/19/15 1530  BP: 81/59 102/59 114/58 125/64  Pulse: 93 87 82 91  Temp:      TempSrc:      Resp:      Height:      Weight:      SpO2:        Intake/Output Summary (Last 24 hours) at 08/19/15 1556 Last data filed at 08/19/15 0900  Gross per 24 hour  Intake    600 ml  Output    200 ml  Net    400 ml   Filed Weights   08/17/15 1216 08/17/15 2057 08/18/15 2023  Weight: 101.2 kg (223 lb 1.7 oz) 101.3 kg (223 lb 5.2 oz) 102.513 kg (226 lb)    Examination:  General exam: Appears calm and comfortable, chronically ill  Respiratory system: Respiratory effort normal. Diminished breath sounds at bases , no wheezing or rhonchi.  Cardiovascular system:s1s2, . No JVD, rubs, gallops or clicks.  Gastrointestinal system: Abdomen is nondistended, soft and nontender. No organomegaly or masses felt.  Central nervous system: Alert and oriented. Bed bound, moving LE only left and right but not against gravity .  Extremities: chronic bilateral LE venous stasis changes, + 1 edema   Data Reviewed: I have personally reviewed following labs and imaging studies  CBC:  Recent Labs Lab 08/15/15 0500 08/16/15 0550 08/17/15 0334 08/18/15 0445 08/19/15 0338  WBC 6.0 5.1 5.1 5.8 8.0  HGB 8.9* 8.5* 8.3* 8.7* 8.5*  HCT 30.2* 27.8* 26.6* 29.7* 27.6*  MCV 89.9 90.0 89.3 91.4 90.2  PLT 250 233 254 [id number]   Basic Metabolic Panel:  Recent Labs Lab 08/14/15 1458 08/15/15 0500 08/16/15 0550 08/17/15 0334 08/18/15 0445 08/19/15 0338  NA 132* 133* 135 135 137 134*  K 4.1 4.0 3.2* 3.5 4.0 4.2  CL 100* 99* 101 100* 100* 97*  CO2 23 25 28 27 29 26   GLUCOSE 155* 108* 114* 102* 96 83  BUN 30* 40* 26* 41* 25* 37*  CREATININE 2.48* 2.89* 2.31* 3.04* 2.29* 3.13*  CALCIUM 7.7* 8.0* 7.5* 7.9* 8.0* 8.2*  PHOS 2.8 3.7 2.9  --  2.3* 2.6    Coagulation Profile:  Recent Labs Lab 08/15/15 0500 08/16/15 0550 08/17/15 0334 08/18/15 0445 08/19/15 0338  INR 2.79* 3.02* 2.72* 2.78* 2.92*    CBG:  Recent Labs Lab 08/18/15 1203 08/18/15 1809 08/18/15 2021 08/19/15 0742 08/19/15 1147  GLUCAP 172* 142* 142* 97 182*   Urine analysis:    Component Value Date/Time   COLORURINE YELLOW 08/07/2015 2139   APPEARANCEUR TURBID* 08/07/2015 2139   LABSPEC 1.019 08/07/2015 2139   PHURINE 8.0 08/07/2015 2139   GLUCOSEU NEGATIVE 08/07/2015 2139   HGBUR LARGE* 08/07/2015 2139   BILIRUBINUR SMALL* 08/07/2015 2139   [place] NEGATIVE 08/07/2015 2139   PROTEINUR 100* 08/07/2015 2139   UROBILINOGEN 0.2 03/06/2013 1748   NITRITE NEGATIVE 08/07/2015 2139   LEUKOCYTESUR LARGE* 08/07/2015 2139    No results found for this or any previous visit (from the past 240 hour(s)).    Radiology  Studies: No results found.    Scheduled Meds: . antiseptic oral rinse  7 mL Mouth Rinse BID  . budesonide (PULMICORT) nebulizer solution  0.5 mg Nebulization BID  . ciprofloxacin  500 mg Oral Q24H  . darbepoetin (ARANESP) injection - DIALYSIS  100 mcg Intravenous Q Mon-HD  . famotidine  20 mg Oral Q1200  . feeding supplement (NEPRO CARB STEADY)  237 mL Oral BID BM  . feeding supplement (PRO-STAT SUGAR FREE 64)  30 mL Oral BID  . insulin aspart  0-15 Units Subcutaneous TID WC  . ipratropium-albuterol  3 mL Nebulization BID  . metoCLOPramide  5 mg Oral TID AC  . midodrine  10 mg Oral TID WC  . multivitamin  1 tablet Oral QHS  . nystatin  5 mL Oral QID  . polyethylene glycol  17 g Oral Daily  . senna-docusate  1 tablet Oral BID  . sodium chloride flush  3 mL Intravenous Q12H  . warfarin  1 mg Oral ONCE-1800  . Warfarin - Pharmacist Dosing Inpatient   Does not apply q1800   Continuous Infusions:    LOS: 12 days   Time spent: 20 minutes   Zelina Jimerson, MD [facility] Pager [phone] If 7PM-7AM, please contact night-coverage [email or web address] Password [facility] 08/19/2015, 3:56 PM

## 2015-08-19 NOTE — Progress Notes (Signed)
[name] for warfarin Indication: h/o DVT/PE and afib  No Known Allergies  Patient Measurements: Height: 5\' 10"  (177.8 cm) Weight: 226 lb (102.513 kg) IBW/kg (Calculated) : 73   Vital Signs: Temp: 97.9 F (36.6 C) (07/14 0559) BP: 104/71 mmHg (07/14 0559) Pulse Rate: 93 (07/14 0559)  Labs:  Recent Labs  08/17/15 0334 08/18/15 0445 08/19/15 0338  HGB 8.3* 8.7* 8.5*  HCT 26.6* 29.7* 27.6*  PLT 254 310 274  LABPROT 28.4* 28.9* 30.0*  INR 2.72* 2.78* 2.92*  CREATININE 3.04* 2.29* 3.13*    Estimated Creatinine Clearance: 25.2 mL/min (by C-G formula based on Cr of 3.13).  Assessment: 75 y.o. M on warfarin PTA for h/o DVT and afib - per SNF MAR, warfarin had been on hold since 6/28 due to elevated INR.   INR elevated upon admission to hospital (4.75), last dose of Coumadin PTA was 6/28.   INR 2.92  Goal of Therapy:  INR 2-3 Monitor platelets by anticoagulation protocol: Yes   Plan:  Warfarin 1mg  po x1 tonight  Daily INR Monitor s/sx of bleeding  Levester Fresh, PharmD, BCPS, [facility] Clinical Pharmacist Pager [phone] 08/19/2015 10:32 AM

## 2015-08-19 NOTE — Progress Notes (Signed)
Thank you for consult on [name]--note that he was admitted from SNF on 08/08/15 due to septic shock. Note that he is showing improvement in ability to sit up in chair. Would recommend continued therapy at SNF after discharge. Will defer CIR consult

## 2015-08-19 NOTE — Progress Notes (Signed)
Pt. Placed on BiPAP for h/s, tolerating well.

## 2015-08-20 DIAGNOSIS — R6521 Severe sepsis with septic shock: Secondary | ICD-10-CM

## 2015-08-20 DIAGNOSIS — L8990 Pressure ulcer of unspecified site, unspecified stage: Secondary | ICD-10-CM

## 2015-08-20 LAB — PROTIME-INR
INR: 2.83 — AB (ref 0.00–1.49)
PROTHROMBIN TIME: 29.3 s — AB (ref 11.6–15.2)

## 2015-08-20 LAB — GLUCOSE, CAPILLARY
Glucose-Capillary: 120 mg/dL — ABNORMAL HIGH (ref 65–99)
Glucose-Capillary: 184 mg/dL — ABNORMAL HIGH (ref 65–99)
Glucose-Capillary: 91 mg/dL (ref 65–99)
Glucose-Capillary: 169 mg/dL — ABNORMAL HIGH (ref 65–99)

## 2015-08-20 MED ORDER — WARFARIN SODIUM 1 MG PO TABS
1.0000 mg | ORAL_TABLET | Freq: Once | ORAL | Status: AC
  Administered 2015-08-20: 1 mg via ORAL
  Filled 2015-08-20: qty 1

## 2015-08-20 NOTE — Evaluation (Signed)
Clinical/Bedside Swallow Evaluation Patient Details  Name: [name] MRN: [id number] Date of Birth: 22-Jan-1941  Today's Date: 08/20/2015 Time: SLP Start Time (ACUTE ONLY): 0913 SLP Stop Time (ACUTE ONLY): 0935 SLP Time Calculation (min) (ACUTE ONLY): 22 min  Past Medical History:  Past Medical History  Diagnosis Date  . Obesity   . Phlebitis     Lower extermity  . Pulmonary emboli ([place]) 2008    submassive, saddle  . Prostate cancer ([place]) 07/2009  . Sleep apnea     on CPAP  . Hx of echocardiogram 12/04/2010    Normal EF >55% no significant valve disease  . History of stress test 06/27/2009    Low risk and EF of approximately 50%  . DVT (deep venous thrombosis) ([place])   . Chronic kidney disease, stage 3     baseline creatinine ~1.4  . HLD (hyperlipidemia)   . HTN (hypertension)   . Dysrhythmia     A fib  . Diabetes mellitus ([place])     diet controlled  . History of hiatal hernia    Past Surgical History:  Past Surgical History  Procedure Laterality Date  . Nephrectomy  1999    for CA  . Cholecystectomy  1999  . Transurethral resection of bladder tumor N/A 03/04/2013    Procedure: CYSTOSCOPY WITH RIGHT RETROGRADE PYELOGRAM AND BLADDER BIOPSY /CLOT EVACUATION/ BIOPSY PROSTATIC URETHRA WITH FULGERATION ;  Surgeon: Molli Hazard, MD;  Location: [facility];  Service: Urology;  Laterality: N/A;  . Cystoscopy w/ retrogrades N/A 04/08/2013    Procedure: CYSTOSCOPY WITH CLOT EVACUATION;  Surgeon: Molli Hazard, MD;  Location: [facility];  Service: Urology;  Laterality: N/A;  . Left shoulder repair    . Shoulder open rotator cuff repair Right 05/03/2015    Procedure: RIGHT SHOULDER ROTATOR CUFF REPAIR OPEN WITH GRAFT AND ANCHOR ;  Surgeon: Latanya Maudlin, MD;  Location: [facility];  Service: Orthopedics;  Laterality: Right;  . Av fistula placement Left 07/25/2015    Procedure: ARTERIOVENOUS (AV) FISTULA CREATION;  Surgeon: Elam Dutch, MD;  Location: [facility];  Service: Vascular;   Laterality: Left;  Axillary block with minimal sedation and supplemental local at operative site.   HPI:  75 y.o. male with past medical history of prostate CA s/p radiation 2011 and RCC s/p left nephrectomy,chronic foley cath, sleep apnea on CPAP, prior DVT and saddle PE, CK-MB stage III, HTN, DM. Recent prolonged admission 05/31/2015-07/31/2014 with AKI, Klebsiella bacteremia, peritoneal right perinephric hematoma, cardiac arrest x 2, post CPR left pneumothorax requiring chest tube placement, intubated x 2, and also ESRD and initiated hemodialysis. Admitted on 08/07/2015 with septic shock d/t UTI vs HCAP. He was placed on pressors and CRRT in the ICU. Blood cultures showed ESBL Klebsiella Pnuemoniae. He was treated with antibiotics, is now off CRRT and tolerating hemo dialysis.MBS previous admission 07/2015 recommended dysphagia 2, thin liquid via cup sip (aspiration with straws). Subsequently upgraded to dysphagia 3 at bedside prior to discharge.    Assessment / Plan / Recommendation Clinical Impression  Evaluation complete. Upon entrance to room, patient self feeding, utilizing a straw with thin liquids and in reclined position, positive s/s of aspiration observed characterized by cough post swallow. Repositioned patient and removed straw per recent MBS recommendations to maximize safety and s/s of aspiraiton eliminated. Oropharyngeal swallow functional with current diet with mildly delayed but functional oral transit of regular texture solids and no overt s/s of aspiration with thin liquids when consumed via small single  cup sips. Educated patient and wife regarding need for ongoing aspiration precautions given recent h/o dysphagia, likely ongoing to some degree given continued aphonia s/p multiple prolonged intubations. SLP will f/u for education and dysphagia treatment to include RMT for strengthening of laryngeal/pharyngeal musculature and potential subsequent impact of vocal cords. Continue to  recommend consideration of ENT consult to evaluate vocal cord function. Acheiving definitive diagnosis will assist to guide SLP treatment.    Aspiration Risk  Mild aspiration risk    Diet Recommendation Regular;Thin liquid   Liquid Administration via: Cup;No straw Medication Administration: Whole meds with puree Supervision: Patient able to self feed;Full supervision/cueing for compensatory strategies Compensations: Slow rate;Small sips/bites Postural Changes: Seated upright at 90 degrees    Other  Recommendations Recommended Consults: Consider ENT evaluation Oral Care Recommendations: Oral care BID   Follow up Recommendations  Skilled Nursing facility    Frequency and Duration min 3x week  2 weeks       Prognosis Prognosis for Safe Diet Advancement: Good      Swallow Study   General HPI: 75 y.o. male with past medical history of prostate CA s/p radiation 2011 and RCC s/p left nephrectomy,chronic foley cath, sleep apnea on CPAP, prior DVT and saddle PE, CK-MB stage III, HTN, DM. Recent prolonged admission 05/31/2015-07/31/2014 with AKI, Klebsiella bacteremia, peritoneal right perinephric hematoma, cardiac arrest x 2, post CPR left pneumothorax requiring chest tube placement, intubated x 2, and also ESRD and initiated hemodialysis. Admitted on 08/07/2015 with septic shock d/t UTI vs HCAP. He was placed on pressors and CRRT in the ICU. Blood cultures showed ESBL Klebsiella Pnuemoniae. He was treated with antibiotics, is now off CRRT and tolerating hemo dialysis.MBS previous admission 07/2015 recommended dysphagia 2, thin liquid via cup sip (aspiration with straws). Subsequently upgraded to dysphagia 3 at bedside prior to discharge.  Type of Study: Bedside Swallow Evaluation Previous Swallow Assessment: see HPI Diet Prior to this Study: Regular;Thin liquids Temperature Spikes Noted: No Respiratory Status: Room air History of Recent Intubation: Yes Length of Intubations (days): 25  days Date extubated: 07/02/15 Behavior/Cognition: Alert;Cooperative;Pleasant mood Oral Cavity Assessment: Within Functional Limits Oral Care Completed by SLP: No Oral Cavity - Dentition: Adequate natural dentition Vision: Functional for self-feeding Self-Feeding Abilities: Able to feed self Patient Positioning: Upright in bed Baseline Vocal Quality: Aphonic;Breathy (intermittent slight breathy phonation achieved. ) Volitional Cough: Weak Volitional Swallow: Able to elicit    Oral/Motor/Sensory Function Overall Oral Motor/Sensory Function: Within functional limits   Ice Chips Ice chips: Not tested   Thin Liquid Thin Liquid: Within functional limits Presentation: Cup;Self Fed    Nectar Thick Nectar Thick Liquid: Not tested   Honey Thick Honey Thick Liquid: Not tested   Puree Puree: Not tested   Solid   GO  Niyonna Betsill MA, CCC-SLP [phone]  Solid: Within functional limits        Jerrard Bradburn Meryl 08/20/2015,10:28 AM

## 2015-08-20 NOTE — Progress Notes (Signed)
[facility] Progress Note  Assessment/Plan: 1. Klebsiella sepsis/ UTI -primaxin changed to cipro; still with low grade temps tmax 99.6 2. Vol overload/ pulm edema - improved; cont to lower volume as tol-edema is peripheral; net UF 2328 on Wed with post wt 101.3; net UF Friday was 2331 with post wt 100.7- slowly titrating volume down. Discussed with him limiting fluids as much as possible. 3. ESRD - MWF -HD Friday tolerated HD in recliner - NEED TO order HD in recliner for  Monday though BP was low and alb was given to keep BP up (this is not available in the outpt units) 4. Hypotension- on midodrine, bp variable - given albumin Friday for BP support 5. 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 7. Nutrition alb 1.5 - liberalize diet to carb mod - to help with P support/intake - K not an issue at present; on renavits/prostat 8. Chronic coumadin - afib hx DVT, INR 2.9 9. MBD P ok with liberalized diet - no binders 10. DNR - palliative care helping 11. Debility - working towards SNF discharge; needs to be OOB daily   Myriam Jacobson, [place] [facility] Beeper [phone] 08/20/2015,8:34 AM  LOS: 13 days   Subjective:   Wants more milk  Objective Filed Vitals:   08/19/15 1947 08/19/15 2012 08/20/15 0437 08/20/15 0700  BP: 103/67  87/57 135/65  Pulse: 90  84   Temp: 99.6 F (37.6 C)  98.1 F (36.7 C)   TempSrc: Oral  Oral   Resp: 18  18   Height:      Weight: 100.925 kg (222 lb 8 oz)     SpO2: 100% 100% 98%    Physical Exam General: NAD eating breakfast Heart: irreg irreg rate low 100s Lungs: grossly clear anteriorly Abdomen: obese soft NT Extremities: + LE edema and dependent edema Dialysis Access: maturing left upper AVF right IJ   Dialysis Orders:  [place] MWF 4h 109.5kg 3K/2.25 bath Hep none R IJ cath/ LUA AVF inserted 6/19 (maturing) Mircera  75 ug not started yet at OP unit tsat 22%, ferr 1958 Phos 3.7 pth 64  Additional Objective Labs: Basic Metabolic Panel:  Recent Labs Lab 08/16/15 0550 08/17/15 0334 08/18/15 0445 08/19/15 0338  NA 135 135 137 134*  K 3.2* 3.5 4.0 4.2  CL 101 100* 100* 97*  CO2 28 27 29 26   GLUCOSE 114* 102* 96 83  BUN 26* 41* 25* 37*  CREATININE 2.31* 3.04* 2.29* 3.13*  CALCIUM 7.5* 7.9* 8.0* 8.2*  PHOS 2.9  --  2.3* 2.6   Liver Function Tests:  Recent Labs Lab 08/16/15 0550 08/18/15 0445 08/19/15 0338  ALBUMIN 1.5* 1.6* 1.5*   CBC:  Recent Labs Lab 08/15/15 0500 08/16/15 0550 08/17/15 0334 08/18/15 0445 08/19/15 0338  WBC 6.0 5.1 5.1 5.8 8.0  HGB 8.9* 8.5* 8.3* 8.7* 8.5*  HCT 30.2* 27.8* 26.6* 29.7* 27.6*  MCV 89.9 90.0 89.3 91.4 90.2  PLT 250 233 254 310 274   CBG:  Recent Labs Lab 08/19/15 0742 08/19/15 1147 08/19/15 1747 08/19/15 1948 08/20/15 0727  GLUCAP 97 182* 89 112* 120*    Lab Results  Component Value Date   INR 2.83* 08/20/2015   INR 2.92* 08/19/2015   INR 2.78* 08/18/2015  Medications:   . antiseptic oral rinse  7 mL Mouth Rinse BID  . budesonide (PULMICORT)  nebulizer solution  0.5 mg Nebulization BID  . ciprofloxacin  500 mg Oral Q24H  . darbepoetin (ARANESP) injection - DIALYSIS  100 mcg Intravenous Q Mon-HD  . famotidine  20 mg Oral Q1200  . feeding supplement (NEPRO CARB STEADY)  237 mL Oral BID BM  . feeding supplement (PRO-STAT SUGAR FREE 64)  30 mL Oral BID  . insulin aspart  0-15 Units Subcutaneous TID WC  . ipratropium-albuterol  3 mL Nebulization BID  . metoCLOPramide  5 mg Oral TID AC  . midodrine  10 mg Oral TID WC  . multivitamin  1 tablet Oral QHS  . nystatin  5 mL Oral QID  . polyethylene glycol  17 g Oral Daily  . senna-docusate  1 tablet Oral BID  . sodium chloride flush  3 mL Intravenous Q12H  . Warfarin - Pharmacist Dosing Inpatient   Does not apply [phone]

## 2015-08-20 NOTE — Progress Notes (Signed)
ANTICOAGULATION CONSULT NOTE  Pharmacy Consult:  Coumadin Indication:  History of DVT/PE and AFib  No Known Allergies  Patient Measurements: Height: 5\' 10"  (177.8 cm) Weight: 222 lb 8 oz (100.925 kg) IBW/kg (Calculated) : 73   Vital Signs: Temp: 98.1 F (36.7 C) (07/15 0833) Temp Source: Oral (07/15 0833) BP: 94/60 mmHg (07/15 0833) Pulse Rate: 96 (07/15 0943)  Labs:  Recent Labs  08/18/15 0445 08/19/15 0338 08/20/15 0433  HGB 8.7* 8.5*  --   HCT 29.7* 27.6*  --   PLT 310 274  --   LABPROT 28.9* 30.0* 29.3*  INR 2.78* 2.92* 2.83*  CREATININE 2.29* 3.13*  --     Estimated Creatinine Clearance: 25 mL/min (by C-G formula based on Cr of 3.13).    Assessment: [name] continues on Coumadin from PTA for history of DVT and Afib.  Per SNF MAR, Coumadin had been on hold since 08/03/15 due to elevated INR.  INR currently therapeutic; no bleeding reported.  Noted that patient is on Cipro, which could increase the effect of Coumadin.   Goal of Therapy:  INR 2-3    Plan:  - Coumadin 1mg  PO today - Daily PT / INR   Kailei Cowens D. Mina Marble, PharmD, BCPS Pager:  [phone] 08/20/2015, 10:56 AM

## 2015-08-20 NOTE — Progress Notes (Addendum)
PROGRESS NOTE    [name]  [id number] DOB: [birth year] DOA: 08/07/2015 PCP: Melinda Crutch    Brief Narrative:  75 y.o. male with recent prolonged extensive admission 05/31/15 through 07/31/15 for multiple problems including but not limited to septic shock due to Klebsiella bacteremia, Pyelonephritis/UTI complicated by hemorrhagic shock due to coagulopathy with subsequent cardiac arrest, complicated by left pneumothorax following CPR s/p chest tube placement.  He was eventually discharged to SNF on 07/31/15; however, he returned to [facility] ED 08/07/15 with fatigue, somnolence, fever. He was found to have UTI and was initially admitted to SDU as he had responded well to 29ml/kg bolus; however, he later developed hypotension along with significant third spacing. He was started on albumin and due to persistent hypotension, PCCM was asked to admit to ICU for septic shock.  Later he was transferred to hospitalist service for further management.   Assessment & Plan:   Sepsis with shock secondary to UTI - found to have UTI with Klebsiella - Was on Imipenem, this has been transitioned to Ciprofloxacin, total of 14 days planned, stop date 08/21/15 - 08/07/15: UC + for klebsiella - BC X 3 NGTD - Afebrile, BP still mildly low, but he is debilitated and mostly bedbound - he is on midodrine for low BP as well    HCAP (healthcare-associated pneumonia) - Left lung, seen on CXR when admitted - Treated with Imipenim initially and improved - Currently not requiring O2  Acute on chronic hypoxic respiratory failure - 2/2 above, Resolved  H/O Afib on coumadin/DVT - Continue coumadin per pharmacy - INR is therapeutic  ESRD - Nephrology following, receiving HD int he chair - Continue aranesp, midodrine  Stage 2 Pressure ulcer - Continue barrier cream, has been evaluated by wound care  Anemia of chronic disease - No signs of bleeding - Hgb stable  Acute functional quadriplegia and weakness - In  the setting of multiple hospitalizations, ICU time and multiple medical problems - SNF for rehab planned.   Acute encephalopathy - Due to above - Resolved  Decreased phonation - Reports that this is due to feeding tube being used during last hospitalization, improving per patient.  He has been evaluated by SLP and felt to be a low aspiration risk.    DVT prophylaxis: Coumadin Code Status: DNR Family Communication: Wife [name] at bedside Disposition Plan: Pending SNF, he is able to sit in a chair for HD and would like to have a SNF locally.  Wife has been doing research into their preferences.  Will attempt to contact SW to discuss today.    Consultants:   PCCM - signed off  Palliative Care  Nephrology  PT  Speech  Procedures:   None  Antimicrobials:    Vanc 07/02 >>>7/6  Cefepime 07/02 >>>7/3  Imipenem 7/3>>>7/13  Ciprofloxacin 7/13 --> 7/16 (end date)  Subjective: Patient sitting in bed eating breakfast.  No complaints.  He is frustrated as he feels he gets a different story from everyone who walks in the room.   Objective: Filed Vitals:   08/19/15 2012 08/20/15 0437 08/20/15 0700 08/20/15 0833  BP:  87/57 135/65 94/60  Pulse:  84  95  Temp:  98.1 F (36.7 C)  98.1 F (36.7 C)  TempSrc:  Oral  Oral  Resp:  18  17  Height:      Weight:      SpO2: 100% 98%  99%    Intake/Output Summary (Last 24 hours) at 08/20/15 [id number] Last data filed at  08/20/15 0859  Gross per 24 hour  Intake    420 ml  Output   2601 ml  Net  -2181 ml   Filed Weights   08/18/15 2023 08/19/15 1707 08/19/15 1947  Weight: 226 lb (102.513 kg) 222 lb (100.699 kg) 222 lb 8 oz (100.925 kg)    Examination:  General exam: Appears calm and comfortable, lying in bed HENT: Phonation is soft and whisper like, swallowing breakfast without cough.  Respiratory system: Clear to auscultation. Respiratory effort normal. Cardiovascular system: S1 & S2 heard, RR, NR. Pedal edema and changes  due to chronic venous insufficiency.  Gastrointestinal system: Abdomen is nondistended, soft and nontender. +BS Central nervous system: Alert and oriented. No focal neurological deficits. Skin: CHanges of chronic venous stasis, no rash.  Psychiatry: Judgement and insight appear normal. Mood & affect appropriate.     Data Reviewed:  CBC:  Recent Labs Lab 08/15/15 0500 08/16/15 0550 08/17/15 0334 08/18/15 0445 08/19/15 0338  WBC 6.0 5.1 5.1 5.8 8.0  HGB 8.9* 8.5* 8.3* 8.7* 8.5*  HCT 30.2* 27.8* 26.6* 29.7* 27.6*  MCV 89.9 90.0 89.3 91.4 90.2  PLT 250 233 254 310 [id number]   Basic Metabolic Panel:  Recent Labs Lab 08/14/15 1458 08/15/15 0500 08/16/15 0550 08/17/15 0334 08/18/15 0445 08/19/15 0338  NA 132* 133* 135 135 137 134*  K 4.1 4.0 3.2* 3.5 4.0 4.2  CL 100* 99* 101 100* 100* 97*  CO2 23 25 28 27 29 26   GLUCOSE 155* 108* 114* 102* 96 83  BUN 30* 40* 26* 41* 25* 37*  CREATININE 2.48* 2.89* 2.31* 3.04* 2.29* 3.13*  CALCIUM 7.7* 8.0* 7.5* 7.9* 8.0* 8.2*  PHOS 2.8 3.7 2.9  --  2.3* 2.6   GFR: Estimated Creatinine Clearance: 25 mL/min (by C-G formula based on Cr of 3.13). Liver Function Tests:  Recent Labs Lab 08/14/15 1458 08/15/15 0500 08/16/15 0550 08/18/15 0445 08/19/15 0338  ALBUMIN 1.6* 1.7* 1.5* 1.6* 1.5*   No results for input(s): LIPASE, AMYLASE in the last 168 hours. No results for input(s): AMMONIA in the last 168 hours. Coagulation Profile:  Recent Labs Lab 08/16/15 0550 08/17/15 0334 08/18/15 0445 08/19/15 0338 08/20/15 0433  INR 3.02* 2.72* 2.78* 2.92* 2.83*   CBG:  Recent Labs Lab 08/19/15 0742 08/19/15 1147 08/19/15 1747 08/19/15 1948 08/20/15 0727  GLUCAP 97 182* 89 112* 120*     Radiology Studies: No results found.   Scheduled Meds: . antiseptic oral rinse  7 mL Mouth Rinse BID  . budesonide (PULMICORT) nebulizer solution  0.5 mg Nebulization BID  . ciprofloxacin  500 mg Oral Q24H  . darbepoetin (ARANESP) injection -  DIALYSIS  100 mcg Intravenous Q Mon-HD  . famotidine  20 mg Oral Q1200  . feeding supplement (NEPRO CARB STEADY)  237 mL Oral BID BM  . feeding supplement (PRO-STAT SUGAR FREE 64)  30 mL Oral BID  . insulin aspart  0-15 Units Subcutaneous TID WC  . ipratropium-albuterol  3 mL Nebulization BID  . metoCLOPramide  5 mg Oral TID AC  . midodrine  10 mg Oral TID WC  . multivitamin  1 tablet Oral QHS  . nystatin  5 mL Oral QID  . polyethylene glycol  17 g Oral Daily  . senna-docusate  1 tablet Oral BID  . sodium chloride flush  3 mL Intravenous Q12H  . Warfarin - Pharmacist Dosing Inpatient   Does not apply q1800   Continuous Infusions:    LOS: 13 days  Time spent: 25 minutes    Gilles Chiquito, MD [facility] Pager [phone]  If 7PM-7AM, please contact night-coverage [email or web address] Password [facility] 08/20/2015, 9:14 AM

## 2015-08-21 LAB — GLUCOSE, CAPILLARY
GLUCOSE-CAPILLARY: 169 mg/dL — AB (ref 65–99)
Glucose-Capillary: 114 mg/dL — ABNORMAL HIGH (ref 65–99)
Glucose-Capillary: 112 mg/dL — ABNORMAL HIGH (ref 65–99)
Glucose-Capillary: 132 mg/dL — ABNORMAL HIGH (ref 65–99)

## 2015-08-21 LAB — CBC
HCT: 26.1 % — ABNORMAL LOW (ref 39.0–52.0)
Hemoglobin: 8 g/dL — ABNORMAL LOW (ref 13.0–17.0)
MCH: 27.5 pg (ref 26.0–34.0)
MCHC: 30.7 g/dL (ref 30.0–36.0)
MCV: 89.7 fL (ref 78.0–100.0)
PLATELETS: 273 10*3/uL (ref 150–400)
RBC: 2.91 MIL/uL — ABNORMAL LOW (ref 4.22–5.81)
RDW: 17.7 % — AB (ref 11.5–15.5)
WBC: 7.3 10*3/uL (ref 4.0–10.5)

## 2015-08-21 LAB — PROTIME-INR
INR: 2.24 — ABNORMAL HIGH (ref 0.00–1.49)
Prothrombin Time: 24.6 seconds — ABNORMAL HIGH (ref 11.6–15.2)

## 2015-08-21 MED ORDER — HYDROCODONE-ACETAMINOPHEN 5-325 MG PO TABS
1.0000 | ORAL_TABLET | Freq: Once | ORAL | Status: AC
  Administered 2015-08-21: 1 via ORAL
  Filled 2015-08-21: qty 1

## 2015-08-21 MED ORDER — WARFARIN SODIUM 3 MG PO TABS
3.0000 mg | ORAL_TABLET | Freq: Once | ORAL | Status: AC
  Administered 2015-08-21: 3 mg via ORAL
  Filled 2015-08-21 (×3): qty 1

## 2015-08-21 NOTE — Progress Notes (Signed)
ANTICOAGULATION CONSULT NOTE  Pharmacy Consult:  Coumadin Indication:  History of DVT/PE and AFib  No Known Allergies  Patient Measurements: Height: 5\' 10"  (177.8 cm) Weight: 222 lb 8 oz (100.925 kg) IBW/kg (Calculated) : 73   Vital Signs: Temp: 98 F (36.7 C) (07/16 0736) Temp Source: Oral (07/16 0736) BP: 91/61 mmHg (07/16 0736) Pulse Rate: 93 (07/16 0736)  Labs:  Recent Labs  08/19/15 0338 08/20/15 0433 08/21/15 0409  HGB 8.5*  --  8.0*  HCT 27.6*  --  26.1*  PLT 274  --  273  LABPROT 30.0* 29.3* 24.6*  INR 2.92* 2.83* 2.24*  CREATININE 3.13*  --   --     Estimated Creatinine Clearance: 25 mL/min (by C-G formula based on Cr of 3.13).    Assessment: 49 YOM continues on Coumadin from PTA for history of DVT and Afib.  Per SNF MAR, Coumadin had been on hold since 08/03/15 due to elevated INR.  INR therapeutic but trending down; no bleeding reported.  Appears that Cipro is not affecting INRs.   Goal of Therapy:  INR 2-3    Plan:  - Coumadin 3mg  PO today - Daily PT / INR    Francene Mcerlean D. Mina Marble, PharmD, BCPS Pager:  [phone] 08/21/2015, 1:21 PM

## 2015-08-21 NOTE — Progress Notes (Signed)
PROGRESS NOTE    [name]  [id number] DOB: 02-01-1941 DOA: 08/07/2015 PCP: Melinda Crutch    Brief Narrative:  75 y.o. male with recent prolonged extensive admission 05/31/15 through 07/31/15 for multiple problems including but not limited to septic shock due to Klebsiella bacteremia, Pyelonephritis/UTI complicated by hemorrhagic shock due to coagulopathy with subsequent cardiac arrest, complicated by left pneumothorax following CPR s/p chest tube placement.  He was eventually discharged to SNF on 07/31/15; however, he returned to [facility] ED 08/07/15 with fatigue, somnolence, fever. He was found to have UTI and was initially admitted to SDU as he had responded well to 39ml/kg bolus; however, he later developed hypotension along with significant third spacing. He was started on albumin and due to persistent hypotension, PCCM was asked to admit to ICU for septic shock.  Later he was transferred to hospitalist service for further management.   Assessment & Plan:   Sepsis with shock secondary to UTI - Was on Imipenem, this has been transitioned to Ciprofloxacin, total of 14 days planned, stop date 08/21/15  (today) - 08/07/15: UC + for klebsiella - BC X 3 NGTD - Afebrile, BP still low, but he is debilitated and mostly bedbound - currently on midodrine, this is a chronic issue - Nausea therapy with Zofran    HCAP (healthcare-associated pneumonia) - Left lung, seen on CXR when admitted  - Currently not requiring O2 - Improved with above Abx  H/O Afib on coumadin/DVT - Continue coumadin per pharmacy - INR is therapeutic at 2.24 today  ESRD - Nephrology following, receiving HD int he chair - Continue aranesp, midodrine  Stage 2 Pressure ulcer - Continue barrier cream, has been evaluated by wound care, continue to monitor  Anemia of chronic disease - No signs of bleeding - Hgb stable  Acute functional quadriplegia and weakness - In the setting of multiple hospitalizations, ICU time  and multiple medical problems - SNF for rehab planned.   Decreased phonation - Reports that this is due to feeding tube being used during last hospitalization, improving per patient.  He has been evaluated by SLP and felt to be a low aspiration risk.    DVT prophylaxis: Coumadin Code Status: DNR Family Communication: Wife [name] at bedside Disposition Plan: Pending SNF, discussed with SW today and bed requests will be made.    Consultants:   PCCM - signed off  Palliative Care  Nephrology  PT  Speech  Procedures:   None  Antimicrobials:    Vanc 07/02 >>>7/6  Cefepime 07/02 >>>7/3  Imipenem 7/3>>>7/13  Ciprofloxacin 7/13 --> 7/16 (end date)  Subjective: Patient doing well.  He has some bruising to left shin which nursing noticed and he thinks it is getting better.  He has a developing bruise on the right ankle, but his wife thinks that has been there a while.  He would like something for nausea.   Objective: Filed Vitals:   08/20/15 2053 08/20/15 2310 08/21/15 0443 08/21/15 0736  BP:   97/71 91/61  Pulse: 80  101 93  Temp:   98.4 F (36.9 C) 98 F (36.7 C)  TempSrc:   Oral Oral  Resp: 18 18 18 17   Height:      Weight:      SpO2:   98% 95%    Intake/Output Summary (Last 24 hours) at 08/21/15 1512 Last data filed at 08/21/15 1340  Gross per 24 hour  Intake    870 ml  Output    107 ml  Net    763 ml   Filed Weights   08/19/15 1707 08/19/15 1947 08/20/15 1950  Weight: 222 lb (100.699 kg) 222 lb 8 oz (100.925 kg) 222 lb 8 oz (100.925 kg)    Examination:  General exam: Appears calm and comfortable, lying in bed HENT: Phonation is soft and whisper like, neck supple Respiratory system: Respiratory effort normal. No audible wheezing Cardiovascular system: S1 & S2 heard, RR, NR. Pedal edema and changes due to chronic venous insufficiency.  Gastrointestinal system: Abdomen is soft and nontender. +BS Central nervous system: Alert and oriented. No focal  neurological deficits.  He moves all extremities but is noticeably week, this is unchanged.  Skin: CHanges of chronic venous stasis, Some healing bruises/hematomas on right shin, right knee and right ankle.  Family in room confirmed they are improving.  Psychiatry: Judgement and insight appear normal. Mood & affect appropriate.     Data Reviewed:  CBC:  Recent Labs Lab 08/16/15 0550 08/17/15 0334 08/18/15 0445 08/19/15 0338 08/21/15 0409  WBC 5.1 5.1 5.8 8.0 7.3  HGB 8.5* 8.3* 8.7* 8.5* 8.0*  HCT 27.8* 26.6* 29.7* 27.6* 26.1*  MCV 90.0 89.3 91.4 90.2 89.7  PLT 233 254 310 274 [id number]   Basic Metabolic Panel:  Recent Labs Lab 08/15/15 0500 08/16/15 0550 08/17/15 0334 08/18/15 0445 08/19/15 0338  NA 133* 135 135 137 134*  K 4.0 3.2* 3.5 4.0 4.2  CL 99* 101 100* 100* 97*  CO2 25 28 27 29 26   GLUCOSE 108* 114* 102* 96 83  BUN 40* 26* 41* 25* 37*  CREATININE 2.89* 2.31* 3.04* 2.29* 3.13*  CALCIUM 8.0* 7.5* 7.9* 8.0* 8.2*  PHOS 3.7 2.9  --  2.3* 2.6   GFR: Estimated Creatinine Clearance: 25 mL/min (by C-G formula based on Cr of 3.13). Liver Function Tests:  Recent Labs Lab 08/15/15 0500 08/16/15 0550 08/18/15 0445 08/19/15 0338  ALBUMIN 1.7* 1.5* 1.6* 1.5*   No results for input(s): LIPASE, AMYLASE in the last 168 hours. No results for input(s): AMMONIA in the last 168 hours. Coagulation Profile:  Recent Labs Lab 08/17/15 0334 08/18/15 0445 08/19/15 0338 08/20/15 0433 08/21/15 0409  INR 2.72* 2.78* 2.92* 2.83* 2.24*   CBG:  Recent Labs Lab 08/20/15 1148 08/20/15 1543 08/20/15 1949 08/21/15 0740 08/21/15 1124  GLUCAP 184* 91 169* 114* 169*     Radiology Studies: No results found.   Scheduled Meds: . antiseptic oral rinse  7 mL Mouth Rinse BID  . ciprofloxacin  500 mg Oral Q24H  . darbepoetin (ARANESP) injection - DIALYSIS  100 mcg Intravenous Q Mon-HD  . famotidine  20 mg Oral Q1200  . feeding supplement (NEPRO CARB STEADY)  237 mL Oral  BID BM  . feeding supplement (PRO-STAT SUGAR FREE 64)  30 mL Oral BID  . insulin aspart  0-15 Units Subcutaneous TID WC  . metoCLOPramide  5 mg Oral TID AC  . midodrine  10 mg Oral TID WC  . multivitamin  1 tablet Oral QHS  . nystatin  5 mL Oral QID  . polyethylene glycol  17 g Oral Daily  . senna-docusate  1 tablet Oral BID  . sodium chloride flush  3 mL Intravenous Q12H  . warfarin  3 mg Oral ONCE-1800  . Warfarin - Pharmacist Dosing Inpatient   Does not apply q1800   Continuous Infusions:    LOS: 14 days    Time spent: 25 minutes    Gilles Chiquito, MD [facility] Pager [phone]  If 7PM-7AM, please contact night-coverage [email or web address] Password [email or web address] 08/21/2015, 3:12 PM

## 2015-08-21 NOTE — Progress Notes (Signed)
[facility] ROUNDING NOTE   Subjective:   Interval History: no complaints today   Objective:  Vital signs in last 24 hours:  Temp:  [98 F (36.7 C)-98.4 F (36.9 C)] 98 F (36.7 C) (07/16 0736) Pulse Rate:  [80-101] 93 (07/16 0736) Resp:  [17-18] 17 (07/16 0736) BP: (91-102)/(61-72) 91/61 mmHg (07/16 0736) SpO2:  [95 %-100 %] 95 % (07/16 0736) Weight:  [100.925 kg (222 lb 8 oz)] 100.925 kg (222 lb 8 oz) (07/15 1950)  Weight change: 0.227 kg (8 oz) Filed Weights   08/19/15 1707 08/19/15 1947 08/20/15 1950  Weight: 100.699 kg (222 lb) 100.925 kg (222 lb 8 oz) 100.925 kg (222 lb 8 oz)    Intake/Output: I/O last 3 completed shifts: In: 16 [P.O.:930] Out: 227 [Urine:227]   Intake/Output this shift:     General: NAD eating breakfast Heart: irreg irreg rate low 100s Lungs: grossly clear anteriorly Abdomen: obese soft NT Extremities: + LE edema and dependent edema Dialysis Access: maturing left upper AVF right IJ   Dialysis Orders: [place] MWF 4h 109.5kg 3K/2.25 bath Hep none R IJ cath/ LUA AVF inserted 6/19 (maturing) Mircera 75 ug not started yet at OP unit tsat 22%, ferr 1958 Phos 3.7 pth 64   Basic Metabolic Panel:  Recent Labs Lab 08/14/15 1458 08/15/15 0500 08/16/15 0550 08/17/15 0334 08/18/15 0445 08/19/15 0338  NA 132* 133* 135 135 137 134*  K 4.1 4.0 3.2* 3.5 4.0 4.2  CL 100* 99* 101 100* 100* 97*  CO2 23 25 28 27 29 26   GLUCOSE 155* 108* 114* 102* 96 83  BUN 30* 40* 26* 41* 25* 37*  CREATININE 2.48* 2.89* 2.31* 3.04* 2.29* 3.13*  CALCIUM 7.7* 8.0* 7.5* 7.9* 8.0* 8.2*  PHOS 2.8 3.7 2.9  --  2.3* 2.6    Liver Function Tests:  Recent Labs Lab 08/14/15 1458 08/15/15 0500 08/16/15 0550 08/18/15 0445 08/19/15 0338  ALBUMIN 1.6* 1.7* 1.5* 1.6* 1.5*   No results for input(s): LIPASE, AMYLASE in the last 168 hours. No results for input(s): AMMONIA in the last 168 hours.  CBC:  Recent Labs Lab 08/16/15 0550  08/17/15 0334 08/18/15 0445 08/19/15 0338 08/21/15 0409  WBC 5.1 5.1 5.8 8.0 7.3  HGB 8.5* 8.3* 8.7* 8.5* 8.0*  HCT 27.8* 26.6* 29.7* 27.6* 26.1*  MCV 90.0 89.3 91.4 90.2 89.7  PLT 233 254 310 274 273    Cardiac Enzymes: No results for input(s): CKTOTAL, CKMB, CKMBINDEX, TROPONINI in the last 168 hours.  BNP: Invalid input(s): POCBNP  CBG:  Recent Labs Lab 08/20/15 0727 08/20/15 1148 08/20/15 1543 08/20/15 1949 08/21/15 0740  GLUCAP 120* 184* 91 169* 114*    Microbiology: Results for orders placed or performed during the hospital encounter of 08/07/15  Urine culture     Status: Abnormal   Collection Time: 08/07/15  8:12 PM  Result Value Ref Range Status   Specimen Description URINE, RANDOM  Final   Special Requests NONE  Final   Culture MULTIPLE SPECIES PRESENT, SUGGEST RECOLLECTION (A)  Final   Report Status 08/08/2015 FINAL  Final  Gram stain     Status: None   Collection Time: 08/07/15  8:12 PM  Result Value Ref Range Status   Specimen Description URINE, RANDOM  Final   Special Requests NONE  Final   Gram Stain   Final    WBC PRESENT, PREDOMINANTLY MONONUCLEAR GRAM NEGATIVE RODS BUDDING YEAST SEEN GRAM POSITIVE COCCI IN CHAINS CYTOSPIN SMEAR    Report Status  08/07/2015 FINAL  Final  Blood Culture (routine x 2)     Status: Abnormal   Collection Time: 08/07/15  8:18 PM  Result Value Ref Range Status   Specimen Description BLOOD BLOOD RIGHT FOREARM  Final   Special Requests BOTTLES DRAWN AEROBIC AND ANAEROBIC 5CC  Final   Culture  Setup Time   Final    GRAM NEGATIVE RODS ANAEROBIC BOTTLE ONLY Organism ID to follow CRITICAL RESULT CALLED TO, READ BACK BY AND VERIFIED WITH: N BATCHELDER 08/08/15 @ [place] IN [place] AEROBIC BOTTLE ONLY CRITICAL RESULT CALLED TO, READ BACK BY AND VERIFIED WITH: L POWELL PHARMD 1751 08/08/15 A BROWNING    Culture (A)  Final    KLEBSIELLA PNEUMONIAE Confirmed Extended Spectrum Beta-Lactamase  Producer (ESBL) STAPHYLOCOCCUS SPECIES (COAGULASE NEGATIVE) THE SIGNIFICANCE OF ISOLATING THIS ORGANISM FROM A SINGLE SET OF BLOOD CULTURES WHEN MULTIPLE SETS ARE DRAWN IS UNCERTAIN. PLEASE NOTIFY THE MICROBIOLOGY DEPARTMENT WITHIN ONE WEEK IF SPECIATION AND SENSITIVITIES ARE REQUIRED.    Report Status 08/11/2015 FINAL  Final   Organism ID, Bacteria KLEBSIELLA PNEUMONIAE  Final      Susceptibility   Klebsiella pneumoniae - MIC*    AMPICILLIN >=32 RESISTANT Resistant     CEFAZOLIN >=64 RESISTANT Resistant     CEFEPIME <=1 RESISTANT Resistant     CEFTAZIDIME 2 RESISTANT Resistant     CEFTRIAXONE 8 RESISTANT Resistant     CIPROFLOXACIN 1 SENSITIVE Sensitive     GENTAMICIN 8 INTERMEDIATE Intermediate     IMIPENEM <=0.25 SENSITIVE Sensitive     TRIMETH/SULFA <=20 SENSITIVE Sensitive     AMPICILLIN/SULBACTAM 8 SENSITIVE Sensitive     PIP/TAZO 8 SENSITIVE Sensitive     * KLEBSIELLA PNEUMONIAE  Blood Culture ID Panel (Reflexed)     Status: Abnormal   Collection Time: 08/07/15  8:18 PM  Result Value Ref Range Status   Enterococcus species NOT DETECTED NOT DETECTED Final   Vancomycin resistance NOT DETECTED NOT DETECTED Final   Listeria monocytogenes NOT DETECTED NOT DETECTED Final   Staphylococcus species DETECTED (A) NOT DETECTED Final    Comment: CRITICAL RESULT CALLED TO, READ BACK BY AND VERIFIED WITH: N BATCHELDER 08/08/15 @ 1249 M VESTAL    Staphylococcus aureus NOT DETECTED NOT DETECTED Final   Methicillin resistance DETECTED (A) NOT DETECTED Final    Comment: CRITICAL RESULT CALLED TO, READ BACK BY AND VERIFIED WITH: N BATCHELDER 08/08/15 @ 1249 M VESTAL    Streptococcus species NOT DETECTED NOT DETECTED Final   Streptococcus agalactiae NOT DETECTED NOT DETECTED Final   Streptococcus pneumoniae NOT DETECTED NOT DETECTED Final   Streptococcus pyogenes NOT DETECTED NOT DETECTED Final   Acinetobacter baumannii NOT DETECTED NOT DETECTED Final   Enterobacteriaceae species DETECTED (A)  NOT DETECTED Final    Comment: CRITICAL RESULT CALLED TO, READ BACK BY AND VERIFIED WITH: N BATCHELDER 08/08/15 @ 1249 M VESTAL    Enterobacter cloacae complex NOT DETECTED NOT DETECTED Final   Escherichia coli NOT DETECTED NOT DETECTED Final   Klebsiella oxytoca NOT DETECTED NOT DETECTED Final   Klebsiella pneumoniae DETECTED (A) NOT DETECTED Final    Comment: CRITICAL RESULT CALLED TO, READ BACK BY AND VERIFIED WITH: N BATCHELDER 08/08/15 @ 1249 M VESTAL3    Proteus species NOT DETECTED NOT DETECTED Final   Serratia marcescens NOT DETECTED NOT DETECTED Final   Carbapenem resistance NOT DETECTED NOT DETECTED Final   Haemophilus influenzae NOT DETECTED NOT DETECTED Final   Neisseria meningitidis NOT DETECTED NOT  DETECTED Final   Pseudomonas aeruginosa NOT DETECTED NOT DETECTED Final   Candida albicans NOT DETECTED NOT DETECTED Final   Candida glabrata NOT DETECTED NOT DETECTED Final   Candida krusei NOT DETECTED NOT DETECTED Final   Candida parapsilosis NOT DETECTED NOT DETECTED Final   Candida tropicalis NOT DETECTED NOT DETECTED Final  Blood Culture (routine x 2)     Status: None   Collection Time: 08/07/15  8:33 PM  Result Value Ref Range Status   Specimen Description BLOOD RIGHT ARM  Final   Special Requests BOTTLES DRAWN AEROBIC AND ANAEROBIC 5CC  Final   Culture NO GROWTH 6 DAYS  Final   Report Status 08/13/2015 FINAL  Final  Culture, blood (routine x 2)     Status: None   Collection Time: 08/09/15  2:25 PM  Result Value Ref Range Status   Specimen Description BLOOD RIGHT HAND  Final   Special Requests IN PEDIATRIC BOTTLE .5CC  Final   Culture NO GROWTH 5 DAYS  Final   Report Status 08/14/2015 FINAL  Final  Culture, blood (routine x 2)     Status: None   Collection Time: 08/09/15  2:32 PM  Result Value Ref Range Status   Specimen Description BLOOD RIGHT ANTECUBITAL  Final   Special Requests BOTTLES DRAWN AEROBIC ONLY 5CC  Final   Culture NO GROWTH 5 DAYS  Final   Report  Status 08/14/2015 FINAL  Final    Coagulation Studies:  Recent Labs  08/19/15 0338 08/20/15 0433 08/21/15 0409  LABPROT 30.0* 29.3* 24.6*  INR 2.92* 2.83* 2.24*    Urinalysis: No results for input(s): COLORURINE, LABSPEC, PHURINE, GLUCOSEU, HGBUR, BILIRUBINUR, KETONESUR, PROTEINUR, UROBILINOGEN, NITRITE, LEUKOCYTESUR in the last 72 hours.  Invalid input(s): APPERANCEUR    Imaging: No results found.   Medications:     . antiseptic oral rinse  7 mL Mouth Rinse BID  . ciprofloxacin  500 mg Oral Q24H  . darbepoetin (ARANESP) injection - DIALYSIS  100 mcg Intravenous Q Mon-HD  . famotidine  20 mg Oral Q1200  . feeding supplement (NEPRO CARB STEADY)  237 mL Oral BID BM  . feeding supplement (PRO-STAT SUGAR FREE 64)  30 mL Oral BID  . insulin aspart  0-15 Units Subcutaneous TID WC  . metoCLOPramide  5 mg Oral TID AC  . midodrine  10 mg Oral TID WC  . multivitamin  1 tablet Oral QHS  . nystatin  5 mL Oral QID  . polyethylene glycol  17 g Oral Daily  . senna-docusate  1 tablet Oral BID  . sodium chloride flush  3 mL Intravenous Q12H  . Warfarin - Pharmacist Dosing Inpatient   Does not apply q1800   alteplase, fentaNYL (SUBLIMAZE) injection, HYDROcodone-acetaminophen, ipratropium-albuterol, lidocaine (PF), lidocaine-prilocaine, ondansetron (ZOFRAN) IV, ondansetron **OR** [DISCONTINUED] ondansetron (ZOFRAN) IV, pentafluoroprop-tetrafluoroeth, polyvinyl alcohol, simethicone  Assessment/ Plan:  1. Klebsiella sepsis/ UTI -primaxin changed to cipro;   Wbc 7.3    Continue for another 5 days  2. Vol overload/ pulm edema - improved; cont to lower volume as tol-edema is peripheral;   3. ESRD - MWF -HD Friday tolerated HD in recliner - NEED TO order HD in recliner for Monday though BP was low and alb was given to keep BP up (this is not available in the outpt units) 4. 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 7. Nutrition alb 1.5 - liberalize diet to carb mod - to help with P support/intake - K not an issue at present; on renavits/prostat 8. 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 DNR - palliative care helping 11. Debility - working towards SNF discharge; needs to be OOB daily   LOS: 14 Jacobey Gura W @TODAY @9 :33 AM

## 2015-08-22 LAB — BASIC METABOLIC PANEL
Anion gap: 9 (ref 5–15)
BUN: 61 mg/dL — AB (ref 4–21)
BUN: 61 mg/dL — AB (ref 6–20)
CO2: 25 mmol/L (ref 22–32)
CREATININE: 4 mg/dL — AB (ref 0.6–1.3)
CREATININE: 3.99 mg/dL — AB (ref 0.61–1.24)
Calcium: 8 mg/dL — ABNORMAL LOW (ref 8.9–10.3)
Chloride: 99 mmol/L — ABNORMAL LOW (ref 101–111)
GFR, EST AFRICAN AMERICAN: 16 mL/min — AB (ref >60)
GFR, EST NON AFRICAN AMERICAN: 14 mL/min — AB (ref >60)
GLUCOSE: 106 mg/dL
Glucose, Bld: 106 mg/dL — ABNORMAL HIGH (ref 65–99)
POTASSIUM: 4.6 mmol/L (ref 3.4–5.3)
POTASSIUM: 4.6 mmol/L (ref 3.5–5.1)
SODIUM: 133 mmol/L — AB (ref 137–147)
SODIUM: 133 mmol/L — AB (ref 135–145)

## 2015-08-22 LAB — GLUCOSE, CAPILLARY
GLUCOSE-CAPILLARY: 80 mg/dL (ref 65–99)
GLUCOSE-CAPILLARY: 143 mg/dL — AB (ref 65–99)
Glucose-Capillary: 111 mg/dL — ABNORMAL HIGH (ref 65–99)
Glucose-Capillary: 130 mg/dL — ABNORMAL HIGH (ref 65–99)

## 2015-08-22 LAB — FERRITIN: FERRITIN: 1298 ng/mL — AB (ref 24–336)

## 2015-08-22 LAB — IRON AND TIBC: IRON: 13 ug/dL — AB (ref 45–182)

## 2015-08-22 LAB — PROTIME-INR
INR: 2.4 — AB (ref 0.00–1.49)
Prothrombin Time: 25.8 seconds — ABNORMAL HIGH (ref 11.6–15.2)

## 2015-08-22 MED ORDER — MIDODRINE HCL 5 MG PO TABS
ORAL_TABLET | ORAL | Status: AC
  Filled 2015-08-22: qty 2

## 2015-08-22 MED ORDER — HEPARIN SODIUM (PORCINE) 1000 UNIT/ML DIALYSIS: 1000.0000 [IU] | INTRAMUSCULAR | Status: DC | PRN

## 2015-08-22 MED ORDER — NEPRO/CARBSTEADY PO LIQD: 237.0000 mL | Freq: Two times a day (BID) | ORAL | Status: AC

## 2015-08-22 MED ORDER — ALTEPLASE 2 MG IJ SOLR: 2.0000 mg | Freq: Once | INTRAMUSCULAR | Status: DC | PRN

## 2015-08-22 MED ORDER — WARFARIN SODIUM 2 MG PO TABS
2.0000 mg | ORAL_TABLET | Freq: Once | ORAL | Status: AC
  Administered 2015-08-22: 2 mg via ORAL
  Filled 2015-08-22: qty 1

## 2015-08-22 MED ORDER — DARBEPOETIN ALFA 150 MCG/0.3ML IJ SOSY
PREFILLED_SYRINGE | INTRAMUSCULAR | Status: AC
  Administered 2015-08-22: 150 ug
  Filled 2015-08-22: qty 0.3

## 2015-08-22 MED ORDER — HYDROCODONE-ACETAMINOPHEN 5-325 MG PO TABS: 1.0000 | ORAL_TABLET | Freq: Three times a day (TID) | ORAL | Status: DC | PRN

## 2015-08-22 MED ORDER — LIDOCAINE-PRILOCAINE 2.5-2.5 % EX CREA: 1.0000 "application " | TOPICAL_CREAM | CUTANEOUS | Status: DC | PRN

## 2015-08-22 MED ORDER — SENNOSIDES-DOCUSATE SODIUM 8.6-50 MG PO TABS: 1.0000 | ORAL_TABLET | Freq: Two times a day (BID) | ORAL | Status: AC

## 2015-08-22 MED ORDER — PRO-STAT SUGAR FREE PO LIQD: 30.0000 mL | Freq: Two times a day (BID) | ORAL | Status: AC

## 2015-08-22 MED ORDER — POLYETHYLENE GLYCOL 3350 17 G PO PACK: 17.0000 g | PACK | Freq: Every day | ORAL | Status: AC

## 2015-08-22 MED ORDER — LIDOCAINE HCL (PF) 1 % IJ SOLN: 5.0000 mL | INTRAMUSCULAR | Status: DC | PRN

## 2015-08-22 MED ORDER — SODIUM CHLORIDE 0.9 % IV SOLN: 100.0000 mL | INTRAVENOUS | Status: DC | PRN

## 2015-08-22 MED ORDER — PENTAFLUOROPROP-TETRAFLUOROETH EX AERO: 1.0000 "application " | INHALATION_SPRAY | CUTANEOUS | Status: DC | PRN

## 2015-08-22 MED ORDER — METOCLOPRAMIDE HCL 5 MG PO TABS: 5.0000 mg | ORAL_TABLET | Freq: Three times a day (TID) | ORAL | Status: AC

## 2015-08-22 NOTE — Consult Note (Signed)
Hospital Consult    Reason for Consult:  Clotted fistula Referring Physician:  Owens Shark MRN #:  [id number]  History of Present Illness: This is a 75 y.o. male left brachial-cephalic AV fistula creation on 07/25/15 by Dr. Oneida Alar. He is currently dialyzing via right IJ [facility] on Mondays, Wednesdays and Fridays.   The patient had a recent prolonged admission from 05/31/15 through 07/31/15 for multiple issues including septic shock secondary to Klebsiella bacteremia, UTI/pyelonephritis, hemorrhagic shock due to right perinephric hematoma s/p embolization resulting in cardiac arrest, post CPR pneumothorax requiring chest tube placement and acute kidney injury leading to ESRD. He was discharged to a SNF and presented to the [facility] ED on 08/07/15 due to fatigue, somnolence and fever. He was found to have a UTI. He developed hypotension and then admitted to the ICU for septic shock.   His PMH includes PE and afib on coumadin, prior nephrectomy, prostate CA, diabetes and sleep apnea.   He is on coumadin and INR today is 2.4.    Past Medical History  Diagnosis Date  . Obesity   . Phlebitis     Lower extermity  . Pulmonary emboli ([place]) 2008    submassive, saddle  . Prostate cancer ([place]) 07/2009  . Sleep apnea     on CPAP  . Hx of echocardiogram 12/04/2010    Normal EF >55% no significant valve disease  . History of stress test 06/27/2009    Low risk and EF of approximately 50%  . DVT (deep venous thrombosis) ([place])   . Chronic kidney disease, stage 3     baseline creatinine ~1.4  . HLD (hyperlipidemia)   . HTN (hypertension)   . Dysrhythmia     A fib  . Diabetes mellitus ([place])     diet controlled  . History of hiatal hernia     Past Surgical History  Procedure Laterality Date  . Nephrectomy  1999    for CA  . Cholecystectomy  1999  . Transurethral resection of bladder tumor N/A 03/04/2013    Procedure: CYSTOSCOPY WITH RIGHT RETROGRADE PYELOGRAM AND BLADDER BIOPSY /CLOT EVACUATION/  BIOPSY PROSTATIC URETHRA WITH FULGERATION ;  Surgeon: Molli Hazard, MD;  Location: [facility];  Service: Urology;  Laterality: N/A;  . Cystoscopy w/ retrogrades N/A 04/08/2013    Procedure: CYSTOSCOPY WITH CLOT EVACUATION;  Surgeon: Molli Hazard, MD;  Location: [facility];  Service: Urology;  Laterality: N/A;  . Left shoulder repair    . Shoulder open rotator cuff repair Right 05/03/2015    Procedure: RIGHT SHOULDER ROTATOR CUFF REPAIR OPEN WITH GRAFT AND ANCHOR ;  Surgeon: Latanya Maudlin, MD;  Location: [facility];  Service: Orthopedics;  Laterality: Right;  . Av fistula placement Left 07/25/2015    Procedure: ARTERIOVENOUS (AV) FISTULA CREATION;  Surgeon: Elam Dutch, MD;  Location: [place];  Service: Vascular;  Laterality: Left;  Axillary block with minimal sedation and supplemental local at operative site.    No Known Allergies  Prior to Admission medications   Medication Sig Start Date End Date Taking? Authorizing Provider  Amino Acids-Protein Hydrolys (FEEDING SUPPLEMENT, PRO-STAT SUGAR FREE 64,) LIQD Take 30 mLs by mouth 3 (three) times daily with meals. 07/31/15  Yes Lavina Hamman, MD  fenofibrate (TRICOR) 145 MG tablet Take 145 mg by mouth daily.  10/18/12  Yes Historical Provider, MD  ferrous sulfate 325 (65 FE) MG EC tablet take 1 tablet by mouth once daily 01/17/15  Yes Troy Sine, MD  guaiFENesin (  ROBITUSSIN) 100 MG/5ML SOLN Take 10 mLs (200 mg total) by mouth every 8 (eight) hours. 07/31/15  Yes Lavina Hamman, MD  HYDROcodone-acetaminophen (NORCO/VICODIN) 5-325 MG tablet Take 1 tablet by mouth every 8 (eight) hours as needed for moderate pain or severe pain (breakthrough pain). Patient taking differently: Take 1 tablet by mouth every 8 (eight) hours as needed for moderate pain.  07/31/15  Yes Lavina Hamman, MD  insulin aspart (NOVOLOG) 100 UNIT/ML injection CBG 121 - 150:2u, CBG 151 - 200:3u, CBG 201 - 250:5u, CBG 251 - 300:8u,CBG 301 - 350:11u, CBG 351 - 400:15u Patient  taking differently: Inject 2-15 Units into the skin 3 (three) times daily with meals. CBG 121-150 2 units, 151-200 3 units, 201-250 5 units, 251-300 8 units, 301-350 11 units, 351-400 15 units 07/31/15  Yes Lavina Hamman, MD  ipratropium-albuterol (DUONEB) 0.5-2.5 (3) MG/3ML SOLN Take 3 mLs by nebulization every 4 (four) hours as needed (shortness of breath/ wheezing).   Yes Historical Provider, MD  LORazepam (ATIVAN) 1 MG tablet Take 1 tablet (1 mg total) by mouth at bedtime. 07/31/15  Yes Lavina Hamman, MD  menthol-cetylpyridinium (CEPACOL) 3 MG lozenge Take 1 lozenge (3 mg total) by mouth as needed for sore throat. 07/31/15  Yes Lavina Hamman, MD  midodrine (PROAMATINE) 10 MG tablet Take 1 tablet (10 mg total) by mouth 3 (three) times daily before meals. Patient taking differently: Take 10 mg by mouth 3 (three) times daily. 6:30am, 11:30am, 16:30pm 07/31/15  Yes Lavina Hamman, MD  multivitamin (RENA-VIT) TABS tablet Take 1 tablet by mouth at bedtime. 07/31/15  Yes Lavina Hamman, MD  Nutritional Supplements (NUTRITIONAL SUPPLEMENT PO) Take 120 mLs by mouth every evening. Med Plus 2.0   Yes Historical Provider, MD  ondansetron (ZOFRAN) 4 MG tablet Take 4 mg by mouth every 6 (six) hours as needed for nausea or vomiting.   Yes Historical Provider, MD  pantoprazole (PROTONIX) 40 MG tablet Take 1 tablet (40 mg total) by mouth 2 (two) times daily before a meal. After 08/12/15 take once a day 07/31/15 08/11/15 Yes Lavina Hamman, MD  polyvinyl alcohol (LIQUIFILM TEARS) 1.4 % ophthalmic solution Place 1 drop into both eyes 2 (two) times daily as needed for dry eyes.   Yes Historical Provider, MD  PRESCRIPTION MEDICATION Inhale into the lungs at bedtime. CPAP   Yes Historical Provider, MD  Amino Acids-Protein Hydrolys (FEEDING SUPPLEMENT, PRO-STAT SUGAR FREE 64,) LIQD Take 30 mLs by mouth 2 (two) times daily. 08/22/15   Reyne Dumas, MD  HYDROcodone-acetaminophen (NORCO/VICODIN) 5-325 MG tablet Take 1 tablet by  mouth every 8 (eight) hours as needed for moderate pain or severe pain. 08/22/15   Reyne Dumas, MD  Maltodextrin-Xanthan Gum (RESOURCE THICKENUP CLEAR) POWD Take 1 g by mouth as needed. 07/31/15   Lavina Hamman, MD  metoCLOPramide (REGLAN) 5 MG tablet Take 1 tablet (5 mg total) by mouth 3 (three) times daily before meals. 08/22/15   Reyne Dumas, MD  Nutritional Supplements (FEEDING SUPPLEMENT, NEPRO CARB STEADY,) LIQD Take 237 mLs by mouth daily at 3 pm. 07/31/15   Lavina Hamman, MD  Nutritional Supplements (FEEDING SUPPLEMENT, NEPRO CARB STEADY,) LIQD Take 237 mLs by mouth 2 (two) times daily between meals. 08/22/15   Reyne Dumas, MD  polyethylene glycol (MIRALAX / GLYCOLAX) packet Take 17 g by mouth daily. 08/22/15   Reyne Dumas, MD  senna-docusate (SENOKOT-S) 8.6-50 MG tablet Take 1 tablet by mouth 2 (two) times daily.  08/22/15   Reyne Dumas, MD  warfarin (COUMADIN) 5 MG tablet Take 1 tablet (5 mg total) by mouth one time only at 6 PM. Patient taking differently: Take 5 mg by mouth every evening.  08/01/15   Lavina Hamman, MD    Social History   Social History  . Marital Status: Married    Spouse Name: N/A  . Number of Children: N/A  . Years of Education: N/A   Occupational History  . Not on file.   Social History Main Topics  . Smoking status: Never Smoker   . Smokeless tobacco: Never Used  . Alcohol Use: 0.0 oz/week    0 Standard drinks or equivalent per week     Comment: occasional beer or wine  . Drug Use: No  . Sexual Activity: Not on file   Other Topics Concern  . Not on file   Social History Narrative     Family History  Problem Relation Age of Onset  . Cancer Mother   . Diabetes Father   . Cancer Father       Physical Examination  Filed Vitals:   08/22/15 1130 08/22/15 1147  BP: 77/49 109/65  Pulse: 98 105  Temp:  98 F (36.7 C)  Resp:     Body mass index is 32.36 kg/(m^2).  2+ left radial pulse; no thrill within fistula   CBC    Component  Value Date/Time   WBC 7.3 08/21/2015 0409   RBC 2.91* 08/21/2015 0409   RBC 2.67* 04/04/2013 0013   HGB 8.0* 08/21/2015 0409   HCT 26.1* 08/21/2015 0409   PLT 273 08/21/2015 0409   MCV 89.7 08/21/2015 0409   MCH 27.5 08/21/2015 0409   MCHC 30.7 08/21/2015 0409   RDW 17.7* 08/21/2015 0409   LYMPHSABS 0.8 08/12/2015 0400   MONOABS 0.4 08/12/2015 0400   EOSABS 0.0 08/12/2015 0400   BASOSABS 0.0 08/12/2015 0400    BMET    Component Value Date/Time   NA 133* 08/22/2015 0333   K 4.6 08/22/2015 0333   CL 99* 08/22/2015 0333   CO2 25 08/22/2015 0333   GLUCOSE 106* 08/22/2015 0333   BUN 61* 08/22/2015 0333   CREATININE 3.99* 08/22/2015 0333   CALCIUM 8.0* 08/22/2015 0333   GFRNONAA 14* 08/22/2015 0333   GFRAA 16* 08/22/2015 0333    COAGS: Lab Results  Component Value Date   INR 2.40* 08/22/2015   INR 2.24* 08/21/2015   INR 2.83* 08/20/2015     Non-Invasive Vascular Imaging:   None this admission    ASSESSMENT/PLAN: This is a 75 y.o. male with ESRD and newly placed fistula ~ a month ago that is now clotted.  Next access will be for left arm graft.  THe pt is on coumadin and is being discharged today.  He is dialyzing via a tunneled dialysis catheter.   Our office will coordinate with the pt for permanent access plan.  Most likely discharging to [place] [phone]  I have examined the patient, reviewed and agree with above.Occluded left upper arm fistula placed by Dr. Oneida Alar on 07/25/2015. Scott's options with patient. Reviewed vein map. Does have small veins in the right arm as well. Recommend left arm prosthetic graft. We'll coordinate this as an outpatient. Is on chronic Coumadin therapy and will need to be off this prior to surgery        Curt Jews, MD 08/22/2015 12:40 PM

## 2015-08-22 NOTE — Progress Notes (Signed)
PT Cancellation Note  Patient Details Name: [name] MRN: [id number] DOB: [birth year]   Cancelled Treatment:    Reason Eval/Treat Not Completed: Patient at procedure or test/unavailable   Currently in HD;  Will follow up later today as time allows;  Otherwise, will follow up for PT tomorrow;   Thank you,  Roney Marion, [place] Pager [phone] Office [phone]     Roney Marion [facility] 08/22/2015, 11:14 AM

## 2015-08-22 NOTE — Progress Notes (Signed)
[facility] ROUNDING NOTE   Subjective:   Interval History: no complaints today  - afebrile- seen on HD- says feels great - AVF seems clotted  Objective:  Vital signs in last 24 hours:  Temp:  [98 F (36.7 C)-98.9 F (37.2 C)] 98 F (36.7 C) (07/17 0743) Pulse Rate:  [69-94] 69 (07/17 0900) Resp:  [17-18] 18 (07/17 0743) BP: (90-125)/(59-76) 90/76 mmHg (07/17 0900) SpO2:  [97 %-99 %] 99 % (07/17 0743) Weight:  [102.286 kg (225 lb 8 oz)] 102.286 kg (225 lb 8 oz) (07/16 1958)  Weight change: 1.361 kg (3 lb) Filed Weights   08/19/15 1947 08/20/15 1950 08/21/15 1958  Weight: 100.925 kg (222 lb 8 oz) 100.925 kg (222 lb 8 oz) 102.286 kg (225 lb 8 oz)    Intake/Output: I/O last 3 completed shifts: In: 750 [P.O.:750] Out: 132 [Urine:82; Emesis/NG output:50]   Intake/Output this shift:     General: NAD  Heart: irreg irreg rate low 100s Lungs: grossly clear anteriorly Abdomen: obese soft NT Extremities: + LE edema and dependent edema Dialysis Access: maturing left upper AVF right IJ   Dialysis Orders: [place] MWF 4h 109.5kg 3K/2.25 bath Hep none R IJ cath/ LUA AVF inserted 6/19 (maturing) Mircera 75 ug not started yet at OP unit tsat 22%, ferr 1958 Phos 3.7 pth 64   Basic Metabolic Panel:  Recent Labs Lab 08/16/15 0550 08/17/15 0334 08/18/15 0445 08/19/15 0338 08/22/15 0333  NA 135 135 137 134* 133*  K 3.2* 3.5 4.0 4.2 4.6  CL 101 100* 100* 97* 99*  CO2 28 27 29 26 25   GLUCOSE 114* 102* 96 83 106*  BUN 26* 41* 25* 37* 61*  CREATININE 2.31* 3.04* 2.29* 3.13* 3.99*  CALCIUM 7.5* 7.9* 8.0* 8.2* 8.0*  PHOS 2.9  --  2.3* 2.6  --     Liver Function Tests:  Recent Labs Lab 08/16/15 0550 08/18/15 0445 08/19/15 0338  ALBUMIN 1.5* 1.6* 1.5*   No results for input(s): LIPASE, AMYLASE in the last 168 hours. No results for input(s): AMMONIA in the last 168 hours.  CBC:  Recent Labs Lab 08/16/15 0550 08/17/15 0334 08/18/15 0445  08/19/15 0338 08/21/15 0409  WBC 5.1 5.1 5.8 8.0 7.3  HGB 8.5* 8.3* 8.7* 8.5* 8.0*  HCT 27.8* 26.6* 29.7* 27.6* 26.1*  MCV 90.0 89.3 91.4 90.2 89.7  PLT 233 254 310 274 273    Cardiac Enzymes: No results for input(s): CKTOTAL, CKMB, CKMBINDEX, TROPONINI in the last 168 hours.  BNP: Invalid input(s): POCBNP  CBG:  Recent Labs Lab 08/21/15 0740 08/21/15 1124 08/21/15 1618 08/21/15 1955 08/22/15 0905  GLUCAP 114* 169* 112* 132* 111*    Microbiology: Results for orders placed or performed during the hospital encounter of 08/07/15  Urine culture     Status: Abnormal   Collection Time: 08/07/15  8:12 PM  Result Value Ref Range Status   Specimen Description URINE, RANDOM  Final   Special Requests NONE  Final   Culture MULTIPLE SPECIES PRESENT, SUGGEST RECOLLECTION (A)  Final   Report Status 08/08/2015 FINAL  Final  Gram stain     Status: None   Collection Time: 08/07/15  8:12 PM  Result Value Ref Range Status   Specimen Description URINE, RANDOM  Final   Special Requests NONE  Final   Gram Stain   Final    WBC PRESENT, PREDOMINANTLY MONONUCLEAR GRAM NEGATIVE RODS BUDDING YEAST SEEN GRAM POSITIVE COCCI IN CHAINS CYTOSPIN SMEAR    Report Status  08/07/2015 FINAL  Final  Blood Culture (routine x 2)     Status: Abnormal   Collection Time: 08/07/15  8:18 PM  Result Value Ref Range Status   Specimen Description BLOOD BLOOD RIGHT FOREARM  Final   Special Requests BOTTLES DRAWN AEROBIC AND ANAEROBIC 5CC  Final   Culture  Setup Time   Final    GRAM NEGATIVE RODS ANAEROBIC BOTTLE ONLY Organism ID to follow CRITICAL RESULT CALLED TO, READ BACK BY AND VERIFIED WITH: N BATCHELDER 08/08/15 @ [place] IN [place] AEROBIC BOTTLE ONLY CRITICAL RESULT CALLED TO, READ BACK BY AND VERIFIED WITH: L POWELL PHARMD 1751 08/08/15 A BROWNING    Culture (A)  Final    KLEBSIELLA PNEUMONIAE Confirmed Extended Spectrum Beta-Lactamase Producer (ESBL) STAPHYLOCOCCUS  SPECIES (COAGULASE NEGATIVE) THE SIGNIFICANCE OF ISOLATING THIS ORGANISM FROM A SINGLE SET OF BLOOD CULTURES WHEN MULTIPLE SETS ARE DRAWN IS UNCERTAIN. PLEASE NOTIFY THE MICROBIOLOGY DEPARTMENT WITHIN ONE WEEK IF SPECIATION AND SENSITIVITIES ARE REQUIRED.    Report Status 08/11/2015 FINAL  Final   Organism ID, Bacteria KLEBSIELLA PNEUMONIAE  Final      Susceptibility   Klebsiella pneumoniae - MIC*    AMPICILLIN >=32 RESISTANT Resistant     CEFAZOLIN >=64 RESISTANT Resistant     CEFEPIME <=1 RESISTANT Resistant     CEFTAZIDIME 2 RESISTANT Resistant     CEFTRIAXONE 8 RESISTANT Resistant     CIPROFLOXACIN 1 SENSITIVE Sensitive     GENTAMICIN 8 INTERMEDIATE Intermediate     IMIPENEM <=0.25 SENSITIVE Sensitive     TRIMETH/SULFA <=20 SENSITIVE Sensitive     AMPICILLIN/SULBACTAM 8 SENSITIVE Sensitive     PIP/TAZO 8 SENSITIVE Sensitive     * KLEBSIELLA PNEUMONIAE  Blood Culture ID Panel (Reflexed)     Status: Abnormal   Collection Time: 08/07/15  8:18 PM  Result Value Ref Range Status   Enterococcus species NOT DETECTED NOT DETECTED Final   Vancomycin resistance NOT DETECTED NOT DETECTED Final   Listeria monocytogenes NOT DETECTED NOT DETECTED Final   Staphylococcus species DETECTED (A) NOT DETECTED Final    Comment: CRITICAL RESULT CALLED TO, READ BACK BY AND VERIFIED WITH: N BATCHELDER 08/08/15 @ 1249 M VESTAL    Staphylococcus aureus NOT DETECTED NOT DETECTED Final   Methicillin resistance DETECTED (A) NOT DETECTED Final    Comment: CRITICAL RESULT CALLED TO, READ BACK BY AND VERIFIED WITH: N BATCHELDER 08/08/15 @ 1249 M VESTAL    Streptococcus species NOT DETECTED NOT DETECTED Final   Streptococcus agalactiae NOT DETECTED NOT DETECTED Final   Streptococcus pneumoniae NOT DETECTED NOT DETECTED Final   Streptococcus pyogenes NOT DETECTED NOT DETECTED Final   Acinetobacter baumannii NOT DETECTED NOT DETECTED Final   Enterobacteriaceae species DETECTED (A) NOT DETECTED Final    Comment:  CRITICAL RESULT CALLED TO, READ BACK BY AND VERIFIED WITH: N BATCHELDER 08/08/15 @ 1249 M VESTAL    Enterobacter cloacae complex NOT DETECTED NOT DETECTED Final   Escherichia coli NOT DETECTED NOT DETECTED Final   Klebsiella oxytoca NOT DETECTED NOT DETECTED Final   Klebsiella pneumoniae DETECTED (A) NOT DETECTED Final    Comment: CRITICAL RESULT CALLED TO, READ BACK BY AND VERIFIED WITH: N BATCHELDER 08/08/15 @ 1249 M VESTAL3    Proteus species NOT DETECTED NOT DETECTED Final   Serratia marcescens NOT DETECTED NOT DETECTED Final   Carbapenem resistance NOT DETECTED NOT DETECTED Final   Haemophilus influenzae NOT DETECTED NOT DETECTED Final   Neisseria meningitidis NOT DETECTED NOT  DETECTED Final   Pseudomonas aeruginosa NOT DETECTED NOT DETECTED Final   Candida albicans NOT DETECTED NOT DETECTED Final   Candida glabrata NOT DETECTED NOT DETECTED Final   Candida krusei NOT DETECTED NOT DETECTED Final   Candida parapsilosis NOT DETECTED NOT DETECTED Final   Candida tropicalis NOT DETECTED NOT DETECTED Final  Blood Culture (routine x 2)     Status: None   Collection Time: 08/07/15  8:33 PM  Result Value Ref Range Status   Specimen Description BLOOD RIGHT ARM  Final   Special Requests BOTTLES DRAWN AEROBIC AND ANAEROBIC 5CC  Final   Culture NO GROWTH 6 DAYS  Final   Report Status 08/13/2015 FINAL  Final  Culture, blood (routine x 2)     Status: None   Collection Time: 08/09/15  2:25 PM  Result Value Ref Range Status   Specimen Description BLOOD RIGHT HAND  Final   Special Requests IN PEDIATRIC BOTTLE .5CC  Final   Culture NO GROWTH 5 DAYS  Final   Report Status 08/14/2015 FINAL  Final  Culture, blood (routine x 2)     Status: None   Collection Time: 08/09/15  2:32 PM  Result Value Ref Range Status   Specimen Description BLOOD RIGHT ANTECUBITAL  Final   Special Requests BOTTLES DRAWN AEROBIC ONLY 5CC  Final   Culture NO GROWTH 5 DAYS  Final   Report Status 08/14/2015 FINAL  Final     Coagulation Studies:  Recent Labs  08/20/15 0433 08/21/15 0409 08/22/15 0333  LABPROT 29.3* 24.6* 25.8*  INR 2.83* 2.24* 2.40*    Urinalysis: No results for input(s): COLORURINE, LABSPEC, PHURINE, GLUCOSEU, HGBUR, BILIRUBINUR, KETONESUR, PROTEINUR, UROBILINOGEN, NITRITE, LEUKOCYTESUR in the last 72 hours.  Invalid input(s): APPERANCEUR    Imaging: No results found.   Medications:     . antiseptic oral rinse  7 mL Mouth Rinse BID  . ciprofloxacin  500 mg Oral Q24H  . darbepoetin (ARANESP) injection - DIALYSIS  100 mcg Intravenous Q Mon-HD  . famotidine  20 mg Oral Q1200  . feeding supplement (NEPRO CARB STEADY)  237 mL Oral BID BM  . feeding supplement (PRO-STAT SUGAR FREE 64)  30 mL Oral BID  . insulin aspart  0-15 Units Subcutaneous TID WC  . metoCLOPramide  5 mg Oral TID AC  . midodrine      . midodrine  10 mg Oral TID WC  . multivitamin  1 tablet Oral QHS  . nystatin  5 mL Oral QID  . polyethylene glycol  17 g Oral Daily  . senna-docusate  1 tablet Oral BID  . sodium chloride flush  3 mL Intravenous Q12H  . Warfarin - Pharmacist Dosing Inpatient   Does not apply q1800   sodium chloride, sodium chloride, alteplase, fentaNYL (SUBLIMAZE) injection, heparin, HYDROcodone-acetaminophen, ipratropium-albuterol, lidocaine (PF), lidocaine-prilocaine, ondansetron (ZOFRAN) IV, ondansetron **OR** [DISCONTINUED] ondansetron (ZOFRAN) IV, pentafluoroprop-tetrafluoroeth, polyvinyl alcohol, simethicone   Dialysis Orders: Center: [facility] on MWF . EDW 109.5 kg (but 6/30 left at 106.8 kg ) No sig change in bp/HD Bath 3k/ 2.25 Ca Time 4hrs Heparin none. Access R IJ perm cath and LUA AVF (07/25/15 inserted)  Mircera 55mcg q 2weeks (not given at op unit yet )  Other= OP labs 08/03/15= Transfer sat 22% Ferritin 1958 / Pth 65/ cor ca 9.5 phos 3.7 hgb 9.5  Assessment/ Plan:  1. Klebsiella sepsis/ UTI -primaxin changed to cipro;   Wbc normal   2. Vol overload/ pulm edema -  improved; cont to  lower volume as tol-edema is peripheral;   3. ESRD - MWF -HD Friday tolerated HD in recliner - NEED TO order HD in recliner for Monday though BP was low and alb was given to keep BP up (this is not available in the outpt units).  AVF appears to be clotted- will need to call [facility] to manage 4. 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 Nutrition alb 1.5 - liberalize diet to carb mod - to help with P support/intake - K not an issue at present; on renavits/prostat 8. 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 DNR - palliative care helping 11. Debility - working towards SNF discharge; needs to be OOB daily   LOS: 15 [name] A @TODAY @9 :19 AM

## 2015-08-22 NOTE — Progress Notes (Signed)
Physical Therapy Treatment Patient Details Name: [name] MRN: [id number] DOB: 28-Nov-1940 Today's Date: 08/22/2015    History of Present Illness Pt re-admitted 08/07/15 (after having being admitted from 4/25-6/25/2017) with fatigue, somnolence, fever. He was found to have UTI. Has been on CVVHD/CRRT until today (08/12/2015) with now looking to attempt if he can tolerate normal HD.    PT Comments    Pt presented supine in bed with HOB elevated and wife present in room. He stated that he sat up in his recliner to receive dialysis earlier today and requested to remain in bed. However, he was agreeable to participate in therapy session. Pt practiced lateral weight shifting in sitting, and lateral scooting with sliding board. PT will plan to continue practicing sliding board transfers with pt at f/u sessions. Pt would continue to benefit from skilled physical therapy services at this time while admitted and after d/c.    Follow Up Recommendations  SNF     Equipment Recommendations  None recommended by PT    Recommendations for Other Services       Precautions / Restrictions Precautions Precautions: Fall Restrictions Weight Bearing Restrictions: No    Mobility  Bed Mobility Overal bed mobility: Needs Assistance Bed Mobility: Supine to Sit;Sit to Supine     Supine to sit: Max assist;+2 for physical assistance;HOB elevated Sit to supine: Max assist;+2 for physical assistance   General bed mobility comments: Pt required increased time and verbal cueing for positioning of upper body, LEs and sequencing  Transfers Overall transfer level: Needs assistance Equipment used: 2 person hand held assist Transfers: Sit to/from Stand Sit to Stand: Mod assist;+2 physical assistance        Lateral/Scoot Transfers: +2 physical assistance;Max assist;With slide board General transfer comment: Pt required increased time and verbal cueing for positioning of bilateral UEs to assist with  scooting as well as verbal cueing for sequencing of lateral scooting technique with sliding board. Pt able to stand with mod A x2 for approximately 30 seconds for hygiene.  Ambulation/Gait                 Stairs            Wheelchair Mobility    Modified Rankin (Stroke Patients Only)       Balance Overall balance assessment: Needs assistance Sitting-balance support: Bilateral upper extremity supported Sitting balance-Leahy Scale: Poor       Standing balance-Leahy Scale: Zero                      Cognition Arousal/Alertness: Awake/alert Behavior During Therapy: WFL for tasks assessed/performed Overall Cognitive Status: Within Functional Limits for tasks assessed                      Exercises      General Comments        Pertinent Vitals/Pain Pain Assessment: Faces Faces Pain Scale: Hurts even more Pain Location: buttocks Pain Descriptors / Indicators: Discomfort Pain Intervention(s): Limited activity within patient's tolerance;Monitored during session;Repositioned    Home Living                      Prior Function            PT Goals (current goals can now be found in the care plan section) Acute Rehab PT Goals Patient Stated Goal: To return home PT Goal Formulation: With patient/family Time For Goal Achievement: 08/26/15 Potential to Achieve Goals: Fair  Progress towards PT goals: Progressing toward goals    Frequency  Min 3X/week    PT Plan Current plan remains appropriate    Co-evaluation             End of Session Equipment Utilized During Treatment: Gait belt Activity Tolerance: Patient limited by fatigue Patient left: in bed;with call bell/phone within reach;with family/visitor present     Time: 1515-1550 PT Time Calculation (min) (ACUTE ONLY): 35 min  Charges:  $Therapeutic Activity: 23-37 mins                    G CodesClearnce Sorrel Sarajane Fambrough 09/20/2015, 4:21 PM Sherie Don, [place],  DPT [phone]

## 2015-08-22 NOTE — Progress Notes (Signed)
ANTICOAGULATION CONSULT NOTE  Pharmacy Consult:  Coumadin Indication:  History of DVT/PE and AFib  No Known Allergies  Patient Measurements: Height: 5\' 10"  (177.8 cm) Weight: 225 lb 8 oz (102.286 kg) IBW/kg (Calculated) : 73   Vital Signs: Temp: 98 F (36.7 C) (07/17 0743) Temp Source: Oral (07/17 0743) BP: 96/48 mmHg (07/17 1000) Pulse Rate: 69 (07/17 0900)  Labs:  Recent Labs  08/20/15 0433 08/21/15 0409 08/22/15 0333  HGB  --  8.0*  --   HCT  --  26.1*  --   PLT  --  273  --   LABPROT 29.3* 24.6* 25.8*  INR 2.83* 2.24* 2.40*  CREATININE  --   --  3.99*    Estimated Creatinine Clearance: 19.8 mL/min (by C-G formula based on Cr of 3.99).    Assessment: [name] continues on Coumadin from PTA for history of DVT and Afib.  Per SNF MAR, Coumadin had been on hold since 08/03/15 due to elevated INR.  INR therapeutic; no bleeding reported.  Appears that Cipro is not affecting INRs.   Goal of Therapy:  INR 2-3    Plan:  - Coumadin 2mg  PO today - Daily PT / INR   Thank you Anette Guarneri, PharmD [phone] 08/22/2015, 11:38 AM

## 2015-08-22 NOTE — Progress Notes (Signed)
SLP Cancellation Note  Patient Details Name: [name] MRN: [id number] DOB: 01-20-1941   Cancelled treatment:        Patient in HD.                                                                                                 [name] 08/22/2015, 11:53 AM

## 2015-08-22 NOTE — Progress Notes (Signed)
No charge note.  Received call from Mrs. Swader.  She is choosing between SNF offers and is leaning towards [facility].    I touched base with Nephrology and requested that the paperwork process to change his HD center to [facility] be started.  Imogene Burn, [place] [facility] Pager: [phone]

## 2015-08-22 NOTE — Discharge Summary (Addendum)
Physician Discharge Summary  [name] MRN: [id number] DOB/AGE: 74-10-1940 74 y.o.  PCP: ROSS,ALAN   Admit date: 08/07/2015 Discharge date: 08/22/2015  Discharge Diagnoses:     Active Problems:   Septic shock (HCC)   Sepsis secondary to UTI (HCC)   HCAP (healthcare-associated pneumonia)   Sepsis (HCC)   Pressure ulcer   Elevated lactic acid level   Elevated troponin   Severe sepsis (HCC)   CKD (chronic kidney disease)   Palliative care encounter   Goals of care, counseling/discussion   Encounter for hospice care discussion   Addendum-patient may need to be reevaluated by [facility] prior to discharge today,AVF appears to be clotted   Follow-up recommendations Follow-up with PCP in 3-5 days , including all  additional recommended appointments as below Follow-up CBC, CMP in 3-5 days DNR dysphagia 3, thin liquids  Medication Administration: Crushed with puree      Current Discharge Medication List    START taking these medications   Details  !! Amino Acids-Protein Hydrolys (FEEDING SUPPLEMENT, PRO-STAT SUGAR FREE 64,) LIQD Take 30 mLs by mouth 2 (two) times daily. Qty: 900 mL, Refills: 0    metoCLOPramide (REGLAN) 5 MG tablet Take 1 tablet (5 mg total) by mouth 3 (three) times daily before meals. Qty: 90 tablet, Refills: 1    !! Nutritional Supplements (FEEDING SUPPLEMENT, NEPRO CARB STEADY,) LIQD Take 237 mLs by mouth 2 (two) times daily between meals. Qty: 1 Can, Refills: 0    polyethylene glycol (MIRALAX / GLYCOLAX) packet Take 17 g by mouth daily. Qty: 14 each, Refills: 0    senna-docusate (SENOKOT-S) 8.6-50 MG tablet Take 1 tablet by mouth 2 (two) times daily. Qty: 30 tablet, Refills: 1     !! - Potential duplicate medications found. Please discuss with provider.    CONTINUE these medications which have CHANGED   Details  HYDROcodone-acetaminophen (NORCO/VICODIN) 5-325 MG tablet Take 1 tablet by mouth every 8 (eight) hours as needed for moderate pain or  severe pain. Qty: 30 tablet, Refills: 0      CONTINUE these medications which have NOT CHANGED   Details  !! Amino Acids-Protein Hydrolys (FEEDING SUPPLEMENT, PRO-STAT SUGAR FREE 64,) LIQD Take 30 mLs by mouth 3 (three) times daily with meals. Qty: 900 mL, Refills: 0    fenofibrate (TRICOR) 145 MG tablet Take 145 mg by mouth daily.     ferrous sulfate 325 (65 FE) MG EC tablet take 1 tablet by mouth once daily Qty: 30 tablet, Refills: 6    guaiFENesin (ROBITUSSIN) 100 MG/5ML SOLN Take 10 mLs (200 mg total) by mouth every 8 (eight) hours. Qty: 473 mL, Refills: 0    insulin aspart (NOVOLOG) 100 UNIT/ML injection CBG 121 - 150:2u, CBG 151 - 200:3u, CBG 201 - 250:5u, CBG 251 - 300:8u,CBG 301 - 350:11u, CBG 351 - 400:15u Qty: 10 mL, Refills: 0    ipratropium-albuterol (DUONEB) 0.5-2.5 (3) MG/3ML SOLN Take 3 mLs by nebulization every 4 (four) hours as needed (shortness of breath/ wheezing).    menthol-cetylpyridinium (CEPACOL) 3 MG lozenge Take 1 lozenge (3 mg total) by mouth as needed for sore throat. Qty: 100 tablet, Refills: 12    midodrine (PROAMATINE) 10 MG tablet Take 1 tablet (10 mg total) by mouth 3 (three) times daily before meals. Qty: 60 tablet, Refills: 0    multivitamin (RENA-VIT) TABS tablet Take 1 tablet by mouth at bedtime. Qty: 30 each, Refills: 0    Nutritional Supplements (NUTRITIONAL SUPPLEMENT PO) Take 120 mLs by mouth  every evening. Med Plus 2.0    ondansetron (ZOFRAN) 4 MG tablet Take 4 mg by mouth every 6 (six) hours as needed for nausea or vomiting.    pantoprazole (PROTONIX) 40 MG tablet Take 1 tablet (40 mg total) by mouth 2 (two) times daily before a meal. After 08/12/15 take once a day Qty: 30 tablet, Refills: 0    polyvinyl alcohol (LIQUIFILM TEARS) 1.4 % ophthalmic solution Place 1 drop into both eyes 2 (two) times daily as needed for dry eyes.    PRESCRIPTION MEDICATION Inhale into the lungs at bedtime. CPAP    Maltodextrin-Xanthan Gum (RESOURCE  THICKENUP CLEAR) POWD Take 1 g by mouth as needed. Qty: 125 g, Refills: 0    !! Nutritional Supplements (FEEDING SUPPLEMENT, NEPRO CARB STEADY,) LIQD Take 237 mLs by mouth daily at 3 pm. Qty: 21 Can, Refills: 0    warfarin (COUMADIN) 5 MG tablet Take 1 tablet (5 mg total) by mouth one time only at 6 PM. Qty: 10 tablet, Refills: 0     !! - Potential duplicate medications found. Please discuss with provider.    STOP taking these medications     LORazepam (ATIVAN) 1 MG tablet      nystatin (MYCOSTATIN) 100000 UNIT/ML suspension          Discharge Condition: stable   Discharge Instructions Get Medicines reviewed and adjusted: Please take all your medications with you for your next visit with your Primary MD  Please request your Primary MD to go over all hospital tests and procedure/radiological results at the follow up, please ask your Primary MD to get all Hospital records sent to his/her office.  If you experience worsening of your admission symptoms, develop shortness of breath, life threatening emergency, suicidal or homicidal thoughts you must seek medical attention immediately by calling 911 or calling your MD immediately if symptoms less severe.  You must read complete instructions/literature along with all the possible adverse reactions/side effects for all the Medicines you take and that have been prescribed to you. Take any new Medicines after you have completely understood and accpet all the possible adverse reactions/side effects.   Do not drive when taking Pain medications.   Do not take more than prescribed Pain, Sleep and Anxiety Medications  Special Instructions: If you have smoked or chewed Tobacco in the last 2 yrs please stop smoking, stop any regular Alcohol and or any Recreational drug use.  Wear Seat belts while driving.  Please note  You were cared for by a hospitalist during your hospital stay. Once you are discharged, your primary care physician  will handle any further medical issues. Please note that NO REFILLS for any discharge medications will be authorized once you are discharged, as it is imperative that you return to your primary care physician (or establish a relationship with a primary care physician if you do not have one) for your aftercare needs so that they can reassess your need for medications and monitor your lab values.  Discharge Instructions    Diet - low sodium heart healthy    Complete by:  As directed      Increase activity slowly    Complete by:  As directed             No Known Allergies    Disposition: 03-Skilled Nursing Facility   Consults:  Nephrology Palliative care     Significant Diagnostic Studies:  Dg Chest 1 View  08/08/2015  CLINICAL DATA:  75 year old male with fever  and lethargy. Possible Healthcare associated pneumonia. EXAM: CHEST 1 VIEW COMPARISON:  08/07/2015 and earlier. FINDINGS: Portable AP supine view at 0459 hours. Stable right IJ dual-lumen dialysis type catheter. Stable cardiac size and mediastinal contours. Stable lung volumes. Interval increased opacity at the left lung base, now mostly obscuring the left hemidiaphragm. Stable ventilation elsewhere. No pneumothorax. No acute pulmonary edema. Left lateral fourth rib deformity is stable since May. Healing left seventh and eighth rib fractures again noted. No new osseous abnormality identified. IMPRESSION: 1. Increasing opacity at the left lung base which could reflect pneumonia or small pleural effusion with atelectasis in this setting. 2. No other acute cardiopulmonary abnormality. Electronically Signed   By: Genevie Ann M.D.   On: 08/08/2015 07:13   Dg Chest Port 1 View  08/12/2015  CLINICAL DATA:  Pulmonary edema. EXAM: PORTABLE CHEST 1 VIEW COMPARISON:  08/08/2015.  07/06/2015. FINDINGS: Right IJ line in stable position . Stable cardiomegaly. Persistent left base atelectasis and or infiltrate. Small left pleural effusion cannot be  excluded. No pneumothorax. Old left posterior lateral fourth rib fracture. IMPRESSION: 1. Right IJ line in stable position. 2. Persistent left base mild atelectasis and or infiltrate. Small left pleural effusion . 3. Stable cardiomegaly. 4. Old left posterior lateral fourth rib fracture. No pneumothorax . Electronically Signed   By: [place]   On: 08/12/2015 06:56   Dg Chest Port 1 View  08/07/2015  CLINICAL DATA:  Fever and lethargy. EXAM: PORTABLE CHEST 1 VIEW COMPARISON:  07/20/2015 FINDINGS: Right IJ dialysis catheter unchanged with tip over the SVC. Lungs are hypoinflated with mild hazy prominence of the perihilar vessels suggesting mild vascular congestion. Moderate stable cardiomegaly. Fractures of the left lateral third and fourth ribs unchanged. Possible fracture of the anterior left second rib. Remainder of the exam is unchanged. IMPRESSION: Cardiomegaly with suggestion of mild vascular congestion. Known left-sided rib fractures as described. Electronically Signed   By: Marin Olp M.D.   On: 08/07/2015 20:32   Dg Abd Portable 1v  08/11/2015  CLINICAL DATA:  Vomiting.  Rule out ileus. EXAM: PORTABLE ABDOMEN - 1 VIEW COMPARISON:  06/13/2015 FINDINGS: No dilated loops of small or large bowel identified within the abdomen or pelvis. IMPRESSION: 1. Nonobstructive bowel gas pattern. Electronically Signed   By: Kerby Moors M.D.   On: 08/11/2015 11:10       Filed Weights   08/19/15 1947 08/20/15 1950 08/21/15 1958  Weight: 100.925 kg (222 lb 8 oz) 100.925 kg (222 lb 8 oz) 102.286 kg (225 lb 8 oz)     Microbiology: No results found for this or any previous visit (from the past 240 hour(s)).     Blood Culture    Component Value Date/Time   SDES BLOOD RIGHT ANTECUBITAL 08/09/2015 1432   SPECREQUEST BOTTLES DRAWN AEROBIC ONLY 5CC 08/09/2015 1432   CULT NO GROWTH 5 DAYS 08/09/2015 1432   REPTSTATUS 08/14/2015 FINAL 08/09/2015 1432      Labs: Results for orders placed or  performed during the hospital encounter of 08/07/15 (from the past 48 hour(s))  Glucose, capillary     Status: Abnormal   Collection Time: 08/20/15 11:48 AM  Result Value Ref Range   Glucose-Capillary 184 (H) 65 - 99 mg/dL  Glucose, capillary     Status: None   Collection Time: 08/20/15  3:43 PM  Result Value Ref Range   Glucose-Capillary 91 65 - 99 mg/dL  Glucose, capillary     Status: Abnormal   Collection Time: 08/20/15  7:49 PM  Result Value Ref Range   Glucose-Capillary 169 (H) 65 - 99 mg/dL  Protime-INR     Status: Abnormal   Collection Time: 08/21/15  4:09 AM  Result Value Ref Range   Prothrombin Time 24.6 (H) 11.6 - 15.2 seconds   INR 2.24 (H) 0.00 - 1.49  CBC     Status: Abnormal   Collection Time: 08/21/15  4:09 AM  Result Value Ref Range   WBC 7.3 4.0 - 10.5 K/uL   RBC 2.91 (L) 4.22 - 5.81 MIL/uL   Hemoglobin 8.0 (L) 13.0 - 17.0 g/dL   HCT 26.1 (L) 39.0 - 52.0 %   MCV 89.7 78.0 - 100.0 fL   MCH 27.5 26.0 - 34.0 pg   MCHC 30.7 30.0 - 36.0 g/dL   RDW 17.7 (H) 11.5 - 15.5 %   Platelets 273 150 - 400 K/uL  Glucose, capillary     Status: Abnormal   Collection Time: 08/21/15  7:40 AM  Result Value Ref Range   Glucose-Capillary 114 (H) 65 - 99 mg/dL  Glucose, capillary     Status: Abnormal   Collection Time: 08/21/15 11:24 AM  Result Value Ref Range   Glucose-Capillary 169 (H) 65 - 99 mg/dL  Glucose, capillary     Status: Abnormal   Collection Time: 08/21/15  4:18 PM  Result Value Ref Range   Glucose-Capillary 112 (H) 65 - 99 mg/dL  Glucose, capillary     Status: Abnormal   Collection Time: 08/21/15  7:55 PM  Result Value Ref Range   Glucose-Capillary 132 (H) 65 - 99 mg/dL  Protime-INR     Status: Abnormal   Collection Time: 08/22/15  3:33 AM  Result Value Ref Range   Prothrombin Time 25.8 (H) 11.6 - 15.2 seconds   INR 2.40 (H) 0.00 - 2.95  Basic metabolic panel     Status: Abnormal   Collection Time: 08/22/15  3:33 AM  Result Value Ref Range   Sodium 133 (L)  135 - 145 mmol/L   Potassium 4.6 3.5 - 5.1 mmol/L   Chloride 99 (L) 101 - 111 mmol/L   CO2 25 22 - 32 mmol/L   Glucose, Bld 106 (H) 65 - 99 mg/dL   BUN 61 (H) 6 - 20 mg/dL   Creatinine, Ser 3.99 (H) 0.61 - 1.24 mg/dL   Calcium 8.0 (L) 8.9 - 10.3 mg/dL   GFR calc non Af Amer 14 (L) >60 mL/min   GFR calc Af Amer 16 (L) >60 mL/min    Comment: (NOTE) The eGFR has been calculated using the CKD EPI equation. This calculation has not been validated in all clinical situations. eGFR's persistently <60 mL/min signify possible Chronic Kidney Disease.    Anion gap 9 5 - 15  Ferritin     Status: Abnormal   Collection Time: 08/22/15  3:33 AM  Result Value Ref Range   Ferritin 1298 (H) 24 - 336 ng/mL  Iron and TIBC     Status: Abnormal   Collection Time: 08/22/15  3:33 AM  Result Value Ref Range   Iron 13 (L) 45 - 182 ug/dL   TIBC NOT CALCULATED 250 - 450 ug/dL    Comment: TRANSFERRIN LESS THAN 70   Saturation Ratios NOT CALCULATED 17.9 - 39.5 %    Comment: TRANSFERRIN LESS THAN 70   UIBC NOT CALCULATED ug/dL    Comment: TRANSFERRIN LESS THAN 70  Glucose, capillary     Status: Abnormal   Collection Time: 08/22/15  9:05 AM  Result Value Ref Range   Glucose-Capillary 111 (H) 65 - 99 mg/dL     Lipid Panel     Component Value Date/Time   CHOL 80 06/07/2015 1715   TRIG 93 06/07/2015 1715   HDL 16* 06/07/2015 1715   CHOLHDL 5.0 06/07/2015 1715   VLDL 19 06/07/2015 1715   LDLCALC 45 06/07/2015 1715     Lab Results  Component Value Date   HGBA1C 7.8* 06/07/2015   HGBA1C 8.6* 04/29/2015   HGBA1C * 01/21/2007    7.6 (NOTE)   The ADA recommends the following therapeutic goals for glycemic   control related to Hgb A1C measurement:   Goal of Therapy:   < 7.0% Hgb A1C   Action Suggested:  > 8.0% Hgb A1C   Ref:  Diabetes Care, 22, Suppl. 1, 1999     Lab Results  Component Value Date   LDLCALC 45 06/07/2015   CREATININE 3.99* 08/22/2015     HPI :* 75 y.o. male new ESRD MWF ([place]  3 op hd txs last on 08/05/15 uneventful except left below edw 109.5 At 106.8) Ho prostate CA s/p radiation 2011 and RCC s/p left nephrectomy and most recently extensive admit 05/31/15 > 07/31/15 for multiple problems including but not limited to Klebsiella bacteremia with septic shock with Pyelonephritis/UTI complicated by hemorrhagic shock due to coagulopathy with subsequent cardiac arrest, complicated by left pneumothorax following CPR s/p chest tube placement.   Now brought from [facility] to ER Secondary to fatigue, somnolence, fever. He was found to have UTI and was initially being admitted to SDU as he had responded well to 41m/kg bolus; however, he later developed hypotension along with significant third spacing. He was started on albumin and due to persistent hypotension, PCCM was asked to admit to ICU for septic shock and was encephalopathic . Currently alert And OX3 sbp 83/ no pressors / O2 sat 98% 2 l [place] K 4.9 , hgb 9.9 wbc8.3 . INR 4.75 CXR today L ?pl efuusion Vs PNA ,UA purulent appearing.Later he was transferred to hospitalist service for further management.    HOSPITAL COURSE: *  Sepsis with shock secondary to UTI - Was on Imipenem, this has been transitioned to Ciprofloxacin, total of 14 days planned, stopped    08/21/15   - 08/07/15: UC + for klebsiella - BC X 3 NGTD - Afebrile, BP still low, but he is debilitated and mostly bedbound - currently on midodrine, this is a chronic issue - Nausea therapy with Zofran   HCAP (healthcare-associated pneumonia) - Left lung, seen on CXR when admitted  - Currently not requiring O2 - Improved with above Abx  H/O Afib on coumadin/DVT - Continue coumadin per pharmacy - INR is therapeutic at 2.24 today  ESRD, Monday Wednesday Friday, tolerating hemodialysis in recliner - Nephrology following, receiving HD in recliner, AVF appears to be clotted- will need to call [facility]VS to manage - Continue aranesp, midodrine  Stage 2 Pressure  ulcer - Continue barrier cream, has been evaluated by wound care, continue to monitor would not recommend dressings because they will continue to get soiled. Would recommend barrier cream instead of dressings to be applied each shift and after each episode of incontinence  Anemia of chronic disease - No signs of bleeding - Hgb stable  Acute functional quadriplegia and weakness - In the setting of multiple hospitalizations, ICU time and multiple medical problems - SNF for rehab planned.   Decreased phonation - Reports that this is due to  feeding tube being used during last hospitalization, improving per patient. He has been evaluated by SLP and felt to be a low aspiration risk.  Currently on dysphagia 3, thin liquids Medication Administration: Crushed with puree   Discharge Exam:    Blood pressure 96/48, pulse 69, temperature 98 F (36.7 C), temperature source Oral, resp. rate 18, height 5' 10" (1.778 m), weight 102.286 kg (225 lb 8 oz), SpO2 99 %.   General exam: Appears calm and comfortable, lying in bed HENT: Phonation is soft and whisper like, neck supple Respiratory system: Respiratory effort normal. No audible wheezing Cardiovascular system: S1 & S2 heard, RR, NR. Pedal edema and changes due to chronic venous insufficiency.  Gastrointestinal system: Abdomen is soft and nontender. +BS Central nervous system: Alert and oriented. No focal neurological deficits. He moves all extremities but is noticeably week, this is unchanged.  Skin: CHanges of chronic venous stasis, Some healing bruises/hematomas on right shin, right knee and right ankle. Family in room confirmed they are improving.  Psychiatry: Judgement and insight appear normal. Mood & affect appropriate     Signed: Inas Avena 08/22/2015, 11:34 AM        Time spent >45 mins

## 2015-08-22 NOTE — Procedures (Signed)
Patient was seen on dialysis and the procedure was supervised.  BFR 350  Via PC BP is  90/76.   Patient appears to be tolerating treatment well- sitting in chair  Jim Philemon A 08/22/2015

## 2015-08-22 NOTE — Progress Notes (Signed)
08/22/2015 3:09 PM Hemodialysis Outpatient Note;Mr. Mabra has been accepted at the [facility] center on a Tuesday, Thursday and Saturday 1st shift schedule. The center can begin treatment tomorrow at 6AM. [facility] can arrange to transport the patient to and from the center and Mrs. Tantillo has agreed to this. The center has been advised that this patient will need assistance with transfers. Thank you. Gordy Savers

## 2015-08-22 NOTE — Clinical Social Work Note (Signed)
At wife's request, patient has been set-up at [facility] (approved by Dr. Mercy Moore this afternoon (CSW advised of approval at 3:16 pm). His schedule is is now TTS with a 6 am chair time. Patient will discharge to [facility] skilled facility and wife contacted [facility] regarding change in HD Center and dialysis schedule. They will continue to provide dialysis transportation to patient at [facility]. Admissions director has submitted information to insurance Nurse, mental health Other) for authorization. Patient's fistula is clotted, but it appears that this will be taken care of outpatient. CSW will facilitate discharge to skilled nursing facility.  Tamieka Rancourt Givens, MSW, LCSW Licensed Clinical Social Worker [place] [phone]

## 2015-08-22 NOTE — Care Management Note (Signed)
Case Management Note  Patient Details  Name: ALIUS LAURANCE MRN: [id number] Date of Birth: 29-Jun-1940  Subjective/Objective:     CM following for progression and d/c planning.               Action/Plan: 08/22/2015 Noted CM consult re HD center. Please be aware that this pt will be scheduled or changing of HD center will be handled by the HD unit secretary, Bryson Dames. This is not managed by CM or CSW. HD unit secretary is aware of pt issues and is working on this at this time.   Expected Discharge Date:      08/22/2015            Expected Discharge Plan:  Skilled Nursing Facility  In-House Referral:  Clinical Social Work, Hospice / Palliative Care  Discharge planning Services     Post Acute Care Choice:    Choice offered to:     DME Arranged:    DME Agency:     HH Arranged:    [place] Agency:     Status of Service:  Completed, signed off  If discussed at [employer], dates discussed:    Additional Comments:  Adron Bene, RN 08/22/2015, 1:18 PM

## 2015-08-23 LAB — RENAL FUNCTION PANEL
ALBUMIN: 2 g/dL — AB (ref 3.5–5.0)
Anion gap: 9 (ref 5–15)
BUN: 20 mg/dL (ref 6–20)
CO2: 27 mmol/L (ref 22–32)
CREATININE: 1.92 mg/dL — AB (ref 0.61–1.24)
Calcium: 8.4 mg/dL — ABNORMAL LOW (ref 8.9–10.3)
Chloride: 101 mmol/L (ref 101–111)
GFR calc Af Amer: 38 mL/min — ABNORMAL LOW (ref >60)
GFR calc non Af Amer: 33 mL/min — ABNORMAL LOW (ref >60)
GLUCOSE: 95 mg/dL (ref 65–99)
PHOSPHORUS: 2.1 mg/dL — AB (ref 2.5–4.6)
POTASSIUM: 3.7 mmol/L (ref 3.5–5.1)
SODIUM: 137 mmol/L (ref 135–145)

## 2015-08-23 LAB — PROTIME-INR
INR: 2.24 — ABNORMAL HIGH (ref 0.00–1.49)
Prothrombin Time: 24.6 seconds — ABNORMAL HIGH (ref 11.6–15.2)

## 2015-08-23 LAB — GLUCOSE, CAPILLARY
GLUCOSE-CAPILLARY: 119 mg/dL — AB (ref 65–99)
GLUCOSE-CAPILLARY: 103 mg/dL — AB (ref 65–99)
Glucose-Capillary: 91 mg/dL (ref 65–99)

## 2015-08-23 MED ORDER — WARFARIN SODIUM 5 MG PO TABS
5.0000 mg | ORAL_TABLET | Freq: Once | ORAL | Status: AC
Start: 2015-08-23 — End: 2015-08-23
  Administered 2015-08-23: 5 mg via ORAL
  Filled 2015-08-23: qty 1

## 2015-08-23 MED ORDER — WARFARIN SODIUM 2 MG PO TABS
2.0000 mg | ORAL_TABLET | Freq: Once | ORAL | Status: DC
  Filled 2015-08-23: qty 1

## 2015-08-23 MED ORDER — MIDODRINE HCL 5 MG PO TABS
ORAL_TABLET | ORAL | Status: AC
  Administered 2015-08-23: 10 mg via ORAL
  Filled 2015-08-23: qty 2

## 2015-08-23 NOTE — Progress Notes (Signed)
Speech Language Pathology Treatment: Dysphagia  Patient Details Name: [name] MRN: [id number] DOB: Oct 11, 1940 Today's Date: 08/23/2015 Time: [id number] SLP Time Calculation (min) (ACUTE ONLY): 11 min  Assessment / Plan / Recommendation Clinical Impression  Skilled observation complete with pos for f/u diet tolerance assessment. Independent verbalization of swallowing precautions. Patient with cough on initial sip of thin liquids, large, likely secondary to delayed swallow initiation. Patient able to independently modify sip size to eliminate s/s of aspiration for remainder of session. Overall, patient appears to be tolerating diet. Plan to hold off on possible RMT treatment until vocal cord function evaluated by ENT. Continue to recommend ENT consult. Will continue to f/u.    HPI HPI: 75 y.o. male with past medical history of prostate CA s/p radiation 2011 and RCC s/p left nephrectomy,chronic foley cath, sleep apnea on CPAP, prior DVT and saddle PE, CK-MB stage III, HTN, DM. Recent prolonged admission 05/31/2015-07/31/2014 with AKI, Klebsiella bacteremia, peritoneal right perinephric hematoma, cardiac arrest x 2, post CPR left pneumothorax requiring chest tube placement, intubated x 2, and also ESRD and initiated hemodialysis. Admitted on 08/07/2015 with septic shock d/t UTI vs HCAP. He was placed on pressors and CRRT in the ICU. Blood cultures showed ESBL Klebsiella Pnuemoniae. He was treated with antibiotics, is now off CRRT and tolerating hemo dialysis.MBS previous admission 07/2015 recommended dysphagia 2, thin liquid via cup sip (aspiration with straws). Subsequently upgraded to dysphagia 3 at bedside prior to discharge.       SLP Plan  Continue with current plan of care     Recommendations  Diet recommendations: Regular;Thin liquid Liquids provided via: Cup;No straw Medication Administration: Whole meds with puree Supervision: Patient able to self feed;Full supervision/cueing for  compensatory strategies Compensations: Slow rate;Small sips/bites Postural Changes and/or Swallow Maneuvers: Seated upright 90 degrees             Oral Care Recommendations: Oral care BID Follow up Recommendations: Skilled Nursing facility Plan: Continue with current plan of care     [place], [place] [phone]    Krystall Kruckenberg Meryl 08/23/2015, 3:10 PM

## 2015-08-23 NOTE — Discharge Summary (Signed)
Physician Discharge Summary  [name] MRN: [id number] DOB/AGE: 1940-03-01 75 y.o.  PCP: ROSS,ALAN   Admit date: 08/07/2015 Discharge date: 08/23/2015  Discharge Diagnoses:     Active Problems:   Septic shock (HCC)   Sepsis secondary to UTI ([place])   HCAP (healthcare-associated pneumonia)   Sepsis ([place])   Pressure ulcer   Elevated lactic acid level   Elevated troponin   Severe sepsis (HCC)   CKD (chronic kidney disease)   Palliative care encounter   Goals of care, counseling/discussion   Encounter for hospice care discussion       Follow-up recommendations #1 Follow-up with PCP in 3-5 days , including all  additional recommended appointments as below #2 Follow-up CBC, CMP in 3-5 days #3 DNR #4 dysphagia 3, thin liquids,Medication Administration: Crushed with puree  #5 Occluded left upper arm fistula placed by Dr. Oneida Alar on 07/25/2015 now clotted- patient needs to follow-up with Early, Sherren Mocha, MD for left arm prosthetic graft, note patient is on chronic Coumadin #6 Daily INR monitoring    Current Discharge Medication List    START taking these medications   Details  !! Amino Acids-Protein Hydrolys (FEEDING SUPPLEMENT, PRO-STAT SUGAR FREE 64,) LIQD Take 30 mLs by mouth 2 (two) times daily. Qty: 900 mL, Refills: 0    metoCLOPramide (REGLAN) 5 MG tablet Take 1 tablet (5 mg total) by mouth 3 (three) times daily before meals. Qty: 90 tablet, Refills: 1    !! Nutritional Supplements (FEEDING SUPPLEMENT, NEPRO CARB STEADY,) LIQD Take 237 mLs by mouth 2 (two) times daily between meals. Qty: 1 Can, Refills: 0    polyethylene glycol (MIRALAX / GLYCOLAX) packet Take 17 g by mouth daily. Qty: 14 each, Refills: 0    senna-docusate (SENOKOT-S) 8.6-50 MG tablet Take 1 tablet by mouth 2 (two) times daily. Qty: 30 tablet, Refills: 1     !! - Potential duplicate medications found. Please discuss with provider.    CONTINUE these medications which have CHANGED   Details   HYDROcodone-acetaminophen (NORCO/VICODIN) 5-325 MG tablet Take 1 tablet by mouth every 8 (eight) hours as needed for moderate pain or severe pain. Qty: 30 tablet, Refills: 0      CONTINUE these medications which have NOT CHANGED   Details  !! Amino Acids-Protein Hydrolys (FEEDING SUPPLEMENT, PRO-STAT SUGAR FREE 64,) LIQD Take 30 mLs by mouth 3 (three) times daily with meals. Qty: 900 mL, Refills: 0    fenofibrate (TRICOR) 145 MG tablet Take 145 mg by mouth daily.     ferrous sulfate 325 (65 FE) MG EC tablet take 1 tablet by mouth once daily Qty: 30 tablet, Refills: 6    guaiFENesin (ROBITUSSIN) 100 MG/5ML SOLN Take 10 mLs (200 mg total) by mouth every 8 (eight) hours. Qty: 473 mL, Refills: 0    insulin aspart (NOVOLOG) 100 UNIT/ML injection CBG 121 - 150:2u, CBG 151 - 200:3u, CBG 201 - 250:5u, CBG 251 - 300:8u,CBG 301 - 350:11u, CBG 351 - 400:15u Qty: 10 mL, Refills: 0    ipratropium-albuterol (DUONEB) 0.5-2.5 (3) MG/3ML SOLN Take 3 mLs by nebulization every 4 (four) hours as needed (shortness of breath/ wheezing).    menthol-cetylpyridinium (CEPACOL) 3 MG lozenge Take 1 lozenge (3 mg total) by mouth as needed for sore throat. Qty: 100 tablet, Refills: 12    midodrine (PROAMATINE) 10 MG tablet Take 1 tablet (10 mg total) by mouth 3 (three) times daily before meals. Qty: 60 tablet, Refills: 0    multivitamin (RENA-VIT) TABS tablet Take  1 tablet by mouth at bedtime. Qty: 30 each, Refills: 0    Nutritional Supplements (NUTRITIONAL SUPPLEMENT PO) Take 120 mLs by mouth every evening. Med Plus 2.0    ondansetron (ZOFRAN) 4 MG tablet Take 4 mg by mouth every 6 (six) hours as needed for nausea or vomiting.    pantoprazole (PROTONIX) 40 MG tablet Take 1 tablet (40 mg total) by mouth 2 (two) times daily before a meal. After 08/12/15 take once a day Qty: 30 tablet, Refills: 0    polyvinyl alcohol (LIQUIFILM TEARS) 1.4 % ophthalmic solution Place 1 drop into both eyes 2 (two) times daily as  needed for dry eyes.    PRESCRIPTION MEDICATION Inhale into the lungs at bedtime. CPAP    Maltodextrin-Xanthan Gum (RESOURCE THICKENUP CLEAR) POWD Take 1 g by mouth as needed. Qty: 125 g, Refills: 0    !! Nutritional Supplements (FEEDING SUPPLEMENT, NEPRO CARB STEADY,) LIQD Take 237 mLs by mouth daily at 3 pm. Qty: 21 Can, Refills: 0    warfarin (COUMADIN) 5 MG tablet Take 1 tablet (5 mg total) by mouth one time only at 6 PM. Qty: 10 tablet, Refills: 0     !! - Potential duplicate medications found. Please discuss with provider.    STOP taking these medications     LORazepam (ATIVAN) 1 MG tablet      nystatin (MYCOSTATIN) 100000 UNIT/ML suspension          Discharge Condition: stable   Discharge Instructions Get Medicines reviewed and adjusted: Please take all your medications with you for your next visit with your Primary MD  Please request your Primary MD to go over all hospital tests and procedure/radiological results at the follow up, please ask your Primary MD to get all Hospital records sent to his/her office.  If you experience worsening of your admission symptoms, develop shortness of breath, life threatening emergency, suicidal or homicidal thoughts you must seek medical attention immediately by calling 911 or calling your MD immediately if symptoms less severe.  You must read complete instructions/literature along with all the possible adverse reactions/side effects for all the Medicines you take and that have been prescribed to you. Take any new Medicines after you have completely understood and accpet all the possible adverse reactions/side effects.   Do not drive when taking Pain medications.   Do not take more than prescribed Pain, Sleep and Anxiety Medications  Special Instructions: If you have smoked or chewed Tobacco in the last 2 yrs please stop smoking, stop any regular Alcohol and or any Recreational drug use.  Wear Seat belts while  driving.  Please note  You were cared for by a hospitalist during your hospital stay. Once you are discharged, your primary care physician will handle any further medical issues. Please note that NO REFILLS for any discharge medications will be authorized once you are discharged, as it is imperative that you return to your primary care physician (or establish a relationship with a primary care physician if you do not have one) for your aftercare needs so that they can reassess your need for medications and monitor your lab values.  Discharge Instructions    Diet - low sodium heart healthy    Complete by:  As directed      Diet - low sodium heart healthy    Complete by:  As directed      Increase activity slowly    Complete by:  As directed      Increase activity slowly  Complete by:  As directed             No Known Allergies    Disposition: 03-Skilled Nursing Facility   Consults:  Nephrology Palliative care Vascular surgery    Significant Diagnostic Studies:  Dg Chest 1 View  08/08/2015  CLINICAL DATA:  75 year old male with fever and lethargy. Possible Healthcare associated pneumonia. EXAM: CHEST 1 VIEW COMPARISON:  08/07/2015 and earlier. FINDINGS: Portable AP supine view at 0459 hours. Stable right IJ dual-lumen dialysis type catheter. Stable cardiac size and mediastinal contours. Stable lung volumes. Interval increased opacity at the left lung base, now mostly obscuring the left hemidiaphragm. Stable ventilation elsewhere. No pneumothorax. No acute pulmonary edema. Left lateral fourth rib deformity is stable since May. Healing left seventh and eighth rib fractures again noted. No new osseous abnormality identified. IMPRESSION: 1. Increasing opacity at the left lung base which could reflect pneumonia or small pleural effusion with atelectasis in this setting. 2. No other acute cardiopulmonary abnormality. Electronically Signed   By: Genevie Ann M.D.   On: 08/08/2015 07:13   Dg  Chest Port 1 View  08/12/2015  CLINICAL DATA:  Pulmonary edema. EXAM: PORTABLE CHEST 1 VIEW COMPARISON:  08/08/2015.  07/06/2015. FINDINGS: Right IJ line in stable position . Stable cardiomegaly. Persistent left base atelectasis and or infiltrate. Small left pleural effusion cannot be excluded. No pneumothorax. Old left posterior lateral fourth rib fracture. IMPRESSION: 1. Right IJ line in stable position. 2. Persistent left base mild atelectasis and or infiltrate. Small left pleural effusion . 3. Stable cardiomegaly. 4. Old left posterior lateral fourth rib fracture. No pneumothorax . Electronically Signed   By: [place]   On: 08/12/2015 06:56   Dg Chest Port 1 View  08/07/2015  CLINICAL DATA:  Fever and lethargy. EXAM: PORTABLE CHEST 1 VIEW COMPARISON:  07/20/2015 FINDINGS: Right IJ dialysis catheter unchanged with tip over the SVC. Lungs are hypoinflated with mild hazy prominence of the perihilar vessels suggesting mild vascular congestion. Moderate stable cardiomegaly. Fractures of the left lateral third and fourth ribs unchanged. Possible fracture of the anterior left second rib. Remainder of the exam is unchanged. IMPRESSION: Cardiomegaly with suggestion of mild vascular congestion. Known left-sided rib fractures as described. Electronically Signed   By: Marin Olp M.D.   On: 08/07/2015 20:32   Dg Abd Portable 1v  08/11/2015  CLINICAL DATA:  Vomiting.  Rule out ileus. EXAM: PORTABLE ABDOMEN - 1 VIEW COMPARISON:  06/13/2015 FINDINGS: No dilated loops of small or large bowel identified within the abdomen or pelvis. IMPRESSION: 1. Nonobstructive bowel gas pattern. Electronically Signed   By: Kerby Moors M.D.   On: 08/11/2015 11:10       Filed Weights   08/21/15 1958 08/22/15 1236 08/22/15 1957  Weight: 102.286 kg (225 lb 8 oz) 98.884 kg (218 lb) 100.472 kg (221 lb 8 oz)     Microbiology: No results found for this or any previous visit (from the past 240 hour(s)).     Blood  Culture    Component Value Date/Time   SDES BLOOD RIGHT ANTECUBITAL 08/09/2015 1432   SPECREQUEST BOTTLES DRAWN AEROBIC ONLY 5CC 08/09/2015 1432   CULT NO GROWTH 5 DAYS 08/09/2015 1432   REPTSTATUS 08/14/2015 FINAL 08/09/2015 1432      Labs: Results for orders placed or performed during the hospital encounter of 08/07/15 (from the past 48 hour(s))  Glucose, capillary     Status: Abnormal   Collection Time: 08/21/15 11:24 AM  Result Value  Ref Range   Glucose-Capillary 169 (H) 65 - 99 mg/dL  Glucose, capillary     Status: Abnormal   Collection Time: 08/21/15  4:18 PM  Result Value Ref Range   Glucose-Capillary 112 (H) 65 - 99 mg/dL  Glucose, capillary     Status: Abnormal   Collection Time: 08/21/15  7:55 PM  Result Value Ref Range   Glucose-Capillary 132 (H) 65 - 99 mg/dL  Protime-INR     Status: Abnormal   Collection Time: 08/22/15  3:33 AM  Result Value Ref Range   Prothrombin Time 25.8 (H) 11.6 - 15.2 seconds   INR 2.40 (H) 0.00 - 1.61  Basic metabolic panel     Status: Abnormal   Collection Time: 08/22/15  3:33 AM  Result Value Ref Range   Sodium 133 (L) 135 - 145 mmol/L   Potassium 4.6 3.5 - 5.1 mmol/L   Chloride 99 (L) 101 - 111 mmol/L   CO2 25 22 - 32 mmol/L   Glucose, Bld 106 (H) 65 - 99 mg/dL   BUN 61 (H) 6 - 20 mg/dL   Creatinine, Ser 3.99 (H) 0.61 - 1.24 mg/dL   Calcium 8.0 (L) 8.9 - 10.3 mg/dL   GFR calc non Af Amer 14 (L) >60 mL/min   GFR calc Af Amer 16 (L) >60 mL/min    Comment: (NOTE) The eGFR has been calculated using the CKD EPI equation. This calculation has not been validated in all clinical situations. eGFR's persistently <60 mL/min signify possible Chronic Kidney Disease.    Anion gap 9 5 - 15  Ferritin     Status: Abnormal   Collection Time: 08/22/15  3:33 AM  Result Value Ref Range   Ferritin 1298 (H) 24 - 336 ng/mL  Iron and TIBC     Status: Abnormal   Collection Time: 08/22/15  3:33 AM  Result Value Ref Range   Iron 13 (L) 45 - 182  ug/dL   TIBC NOT CALCULATED 250 - 450 ug/dL    Comment: TRANSFERRIN LESS THAN 70   Saturation Ratios NOT CALCULATED 17.9 - 39.5 %    Comment: TRANSFERRIN LESS THAN 70   UIBC NOT CALCULATED ug/dL    Comment: TRANSFERRIN LESS THAN 70  Glucose, capillary     Status: Abnormal   Collection Time: 08/22/15  9:05 AM  Result Value Ref Range   Glucose-Capillary 111 (H) 65 - 99 mg/dL  Glucose, capillary     Status: None   Collection Time: 08/22/15 12:39 PM  Result Value Ref Range   Glucose-Capillary 80 65 - 99 mg/dL  Glucose, capillary     Status: Abnormal   Collection Time: 08/22/15  5:23 PM  Result Value Ref Range   Glucose-Capillary 143 (H) 65 - 99 mg/dL  Glucose, capillary     Status: Abnormal   Collection Time: 08/22/15  7:59 PM  Result Value Ref Range   Glucose-Capillary 130 (H) 65 - 99 mg/dL  Glucose, capillary     Status: Abnormal   Collection Time: 08/23/15  8:24 AM  Result Value Ref Range   Glucose-Capillary 119 (H) 65 - 99 mg/dL     Lipid Panel     Component Value Date/Time   CHOL 80 06/07/2015 1715   TRIG 93 06/07/2015 1715   HDL 16* 06/07/2015 1715   CHOLHDL 5.0 06/07/2015 1715   VLDL 19 06/07/2015 1715   LDLCALC 45 06/07/2015 1715     Lab Results  Component Value Date   HGBA1C 7.8* 06/07/2015  HGBA1C 8.6* 04/29/2015   HGBA1C * 01/21/2007    7.6 (NOTE)   The ADA recommends the following therapeutic goals for glycemic   control related to Hgb A1C measurement:   Goal of Therapy:   < 7.0% Hgb A1C   Action Suggested:  > 8.0% Hgb A1C   Ref:  Diabetes Care, 22, Suppl. 1, 1999     Lab Results  Component Value Date   LDLCALC 45 06/07/2015   CREATININE 3.99* 08/22/2015     HPI :* 75 y.o. male new ESRD MWF ([place] 3 op hd txs last on 08/05/15 uneventful except left below edw 109.5 At 106.8) Ho prostate CA s/p radiation 2011 and RCC s/p left nephrectomy and most recently extensive admit 05/31/15 > 07/31/15 for multiple problems including but not limited to  Klebsiella bacteremia with septic shock with Pyelonephritis/UTI complicated by hemorrhagic shock due to coagulopathy with subsequent cardiac arrest, complicated by left pneumothorax following CPR s/p chest tube placement.   Now [facility] to ER Secondary to fatigue, somnolence, fever. He was found to have UTI and was initially being admitted to SDU as he had responded well to 37m/kg bolus; however, he later developed hypotension along with significant third spacing. He was started on albumin and due to persistent hypotension, PCCM was asked to admit to ICU for septic shock and was encephalopathic . Currently alert And OX3 sbp 83/ no pressors / O2 sat 98% 2 l [place] K 4.9 , hgb 9.9 wbc8.3 . INR 4.75 CXR today L ?pl efuusion Vs PNA ,UA purulent appearing.Later he was transferred to hospitalist service for further management.    HOSPITAL COURSE: *  Sepsis with shock secondary to UTI - Was on Imipenem, this has been transitioned to Ciprofloxacin, total of 14 days planned, stopped    08/21/15   - 08/07/15: UC + for klebsiella - BC X 3 NGTD - Afebrile, BP still low, but he is debilitated and mostly bedbound - currently on midodrine, this is a chronic issue - Nausea therapy with Zofran   HCAP (healthcare-associated pneumonia) - Left lung, seen on CXR when admitted  - Currently not requiring O2 - Improved with above Abx  H/O Afib on coumadin/DVT - Continue coumadin per pharmacy - INR is therapeutic at 2.24 today Coumadin may need to be placed on hold prior to his left arm prosthetic graft  ESRD, Monday Wednesday Friday, tolerating hemodialysis in recliner - Nephrology following, receiving HD in recliner, AVF appears to be clotted- Dr EDonnetta Hutching Recommends  left arm prosthetic graft, please call to arrange for outpatient appointment with [facility] - Continue aranesp, midodrine  Stage 2 Pressure ulcer - Continue barrier cream, has been evaluated by wound care, continue to monitor would not  recommend dressings because they will continue to get soiled. Would recommend barrier cream instead of dressings to be applied each shift and after each episode of incontinence  Anemia of chronic disease - No signs of bleeding - Hgb stable , 8.5 prior to discharge  Acute functional quadriplegia and weakness - In the setting of multiple hospitalizations, ICU time and multiple medical problems - SNF for rehab planned.   Decreased phonation - Reports that this is due to feeding tube being used during last hospitalization, improving per patient. He has been evaluated by SLP and felt to be a low aspiration risk.  Currently on regular diet, thin liquids   Regular;Thin liquid as per assessment on 7/15  Liquid Administration via: Cup;No straw Medication Administration: Whole meds with  puree Supervision: Patient able to self feed;Full supervision/cueing for compensatory strategies Compensations: Slow rate;Small sips/bites Postural Changes: Seated upright at 90 degrees   Discharge Exam:    Blood pressure 83/52, pulse 99, temperature 98.5 F (36.9 C), temperature source Oral, resp. rate 17, height _0  (1.778 m), weight 100.472 kg (221 lb 8 oz), SpO2 99 %.   General exam: Appears calm and comfortable, lying in bed HENT: Phonation is soft and whisper like, neck supple Respiratory system: Respiratory effort normal. No audible wheezing Cardiovascular system: S1 & S2 heard, RR, NR. Pedal edema and changes due to chronic venous insufficiency.  Gastrointestinal system: Abdomen is soft and nontender. +BS Central nervous system: Alert and oriented. No focal neurological deficits. He moves all extremities but is noticeably week, this is unchanged.  Skin: CHanges of chronic venous stasis, Some healing bruises/hematomas on right shin, right knee and right ankle. Family in room confirmed they are improving.  Psychiatry: Judgement and insight appear normal. Mood & affect appropriate   Follow-up  Information    Follow up with ROSS,ALAN. Schedule an appointment as soon as possible for a visit in 3 days.   Specialty:  Obstetrics and Gynecology   Why:  Hospital follow-up      Follow up with Early, Sherren Mocha, MD. Schedule an appointment as soon as possible for a visit in 1 week.   Specialties:  Vascular Surgery, Cardiology   Why:  Call office to make appointment regarding a fistula   Contact information:   [place] [address] [phone]       Signed: Reyne Dumas 08/23/2015, 8:54 AM        Time spent >45 mins

## 2015-08-23 NOTE — Progress Notes (Signed)
PT Cancellation Note  Patient Details Name: [name] MRN: [id number] DOB: November 30, 1940   Cancelled Treatment:    Reason Eval/Treat Not Completed: Patient at procedure or test/unavailable   Currently in HD;  Will follow up later today as time allows;  Otherwise, will follow up for PT tomorrow;   Thank you,  Roney Marion, PT  Acute Rehabilitation Services Pager [phone] Office [phone]     Roney Marion [facility] 08/23/2015, 12:12 PM

## 2015-08-23 NOTE — Clinical Social Work Note (Addendum)
Insurance authorization received from [employer] and Mr. Budz will discharge to [facility] skilled facility today. Wife informed and patient will be transported by ambulance.  Byron Peacock Givens, MSW, LCSW Licensed Clinical Social Worker [place] [phone]

## 2015-08-23 NOTE — Procedures (Signed)
Patient was seen on dialysis and the procedure was supervised.  BFR 400  Via PC BP is  110/61.   Patient appears to be tolerating treatment well  [name] A 08/23/2015

## 2015-08-23 NOTE — Progress Notes (Signed)
SLP Cancellation Note  Patient Details Name: BAYANI CESENA MRN: [id number] DOB: 1940-05-06   Cancelled treatment:       Reason Eval/Treat Not Completed: Patient at procedure or test/unavailable  Gabriel Rainwater [place], CCC-SLP [phone]  Gabriel Rainwater [place] 08/23/2015, 12:18 PM

## 2015-08-23 NOTE — Progress Notes (Signed)
[facility] ROUNDING NOTE   Subjective:   Interval History: after much discussion and planning by many parties he has now been [facility] at [facility]- had HD yest with 1700 removed tolerated well- will need short HD today as his days are being changed to TTS as an OP   Objective:  Vital signs in last 24 hours:  Temp:  [97.8 F (36.6 C)-98.7 F (37.1 C)] 98.5 F (36.9 C) (07/18 0826) Pulse Rate:  [90-105] 99 (07/18 0826) Resp:  [17-20] 17 (07/18 0826) BP: (77-109)/(48-72) 103/68 mmHg (07/18 1008) SpO2:  [96 %-99 %] 99 % (07/18 1008) Weight:  [98.884 kg (218 lb)-100.472 kg (221 lb 8 oz)] 100.472 kg (221 lb 8 oz) (07/17 1957)  Weight change: -3.402 kg (-7 lb 8 oz) Filed Weights   08/21/15 1958 08/22/15 1236 08/22/15 1957  Weight: 102.286 kg (225 lb 8 oz) 98.884 kg (218 lb) 100.472 kg (221 lb 8 oz)    Intake/Output: I/O last 3 completed shifts: In: [id number] [P.O.:517] Out: [place] [Urine:125; [id number]   Intake/Output this shift:  Total I/O In: 240 [P.O.:240] Out: 0   General: NAD  Heart: irreg irreg rate low 100s Lungs: grossly clear anteriorly Abdomen: obese soft NT Extremities: + LE edema and dependent edema Dialysis Access: clotted  left upper AVF right IJ   Dialysis Orders: [place] MWF 4h 109.5kg 3K/2.25 bath Hep none R IJ cath/ LUA AVF inserted 6/19 (maturing) Mircera 75 ug not started yet at OP unit tsat 22%, ferr 1958 Phos 3.7 pth 65- will now be going to [facility]   Basic Metabolic Panel:  Recent Labs Lab 08/17/15 0334 08/18/15 0445 08/19/15 0338 08/22/15 0333  NA 135 137 134* 133*  K 3.5 4.0 4.2 4.6  CL 100* 100* 97* 99*  CO2 27 29 26 25   GLUCOSE 102* 96 83 106*  BUN 41* 25* 37* 61*  CREATININE 3.04* 2.29* 3.13* 3.99*  CALCIUM 7.9* 8.0* 8.2* 8.0*  PHOS  --  2.3* 2.6  --     Liver Function Tests:  Recent Labs Lab 08/18/15 0445 08/19/15 0338  ALBUMIN 1.6* 1.5*   No results for input(s): LIPASE,  AMYLASE in the last 168 hours. No results for input(s): AMMONIA in the last 168 hours.  CBC:  Recent Labs Lab 08/17/15 0334 08/18/15 0445 08/19/15 0338 08/21/15 0409  WBC 5.1 5.8 8.0 7.3  HGB 8.3* 8.7* 8.5* 8.0*  HCT 26.6* 29.7* 27.6* 26.1*  MCV 89.3 91.4 90.2 89.7  PLT 254 310 274 273    Cardiac Enzymes: No results for input(s): CKTOTAL, CKMB, CKMBINDEX, TROPONINI in the last 168 hours.  BNP: Invalid input(s): POCBNP  CBG:  Recent Labs Lab 08/22/15 0905 08/22/15 1239 08/22/15 1723 08/22/15 1959 08/23/15 0824  GLUCAP 111* 80 143* 130* 119*    Microbiology: Results for orders placed or performed during the hospital encounter of 08/07/15  Urine culture     Status: Abnormal   Collection Time: 08/07/15  8:12 PM  Result Value Ref Range Status   Specimen Description URINE, RANDOM  Final   Special Requests NONE  Final   Culture MULTIPLE SPECIES PRESENT, SUGGEST RECOLLECTION (A)  Final   Report Status 08/08/2015 FINAL  Final  Gram stain     Status: None   Collection Time: 08/07/15  8:12 PM  Result Value Ref Range Status   Specimen Description URINE, RANDOM  Final   Special Requests NONE  Final   Gram Stain  Final    WBC PRESENT, PREDOMINANTLY MONONUCLEAR GRAM NEGATIVE RODS BUDDING YEAST SEEN GRAM POSITIVE COCCI IN CHAINS CYTOSPIN SMEAR    Report Status 08/07/2015 FINAL  Final  Blood Culture (routine x 2)     Status: Abnormal   Collection Time: 08/07/15  8:18 PM  Result Value Ref Range Status   Specimen Description BLOOD BLOOD RIGHT FOREARM  Final   Special Requests BOTTLES DRAWN AEROBIC AND ANAEROBIC 5CC  Final   Culture  Setup Time   Final    GRAM NEGATIVE RODS ANAEROBIC BOTTLE ONLY Organism ID to follow CRITICAL RESULT CALLED TO, READ BACK BY AND VERIFIED WITH: N BATCHELDER 08/08/15 @ [place] AEROBIC BOTTLE ONLY CRITICAL RESULT CALLED TO, READ BACK BY AND VERIFIED WITH: L POWELL PHARMD 1751 08/08/15 A BROWNING     Culture (A)  Final    KLEBSIELLA PNEUMONIAE Confirmed Extended Spectrum Beta-Lactamase Producer (ESBL) STAPHYLOCOCCUS SPECIES (COAGULASE NEGATIVE) THE SIGNIFICANCE OF ISOLATING THIS ORGANISM FROM A SINGLE SET OF BLOOD CULTURES WHEN MULTIPLE SETS ARE DRAWN IS UNCERTAIN. PLEASE NOTIFY THE MICROBIOLOGY DEPARTMENT WITHIN ONE WEEK IF SPECIATION AND SENSITIVITIES ARE REQUIRED.    Report Status 08/11/2015 FINAL  Final   Organism ID, Bacteria KLEBSIELLA PNEUMONIAE  Final      Susceptibility   Klebsiella pneumoniae - MIC*    AMPICILLIN >=32 RESISTANT Resistant     CEFAZOLIN >=64 RESISTANT Resistant     CEFEPIME <=1 RESISTANT Resistant     CEFTAZIDIME 2 RESISTANT Resistant     CEFTRIAXONE 8 RESISTANT Resistant     CIPROFLOXACIN 1 SENSITIVE Sensitive     GENTAMICIN 8 INTERMEDIATE Intermediate     IMIPENEM <=0.25 SENSITIVE Sensitive     TRIMETH/SULFA <=20 SENSITIVE Sensitive     AMPICILLIN/SULBACTAM 8 SENSITIVE Sensitive     PIP/TAZO 8 SENSITIVE Sensitive     * KLEBSIELLA PNEUMONIAE  Blood Culture ID Panel (Reflexed)     Status: Abnormal   Collection Time: 08/07/15  8:18 PM  Result Value Ref Range Status   Enterococcus species NOT DETECTED NOT DETECTED Final   Vancomycin resistance NOT DETECTED NOT DETECTED Final   Listeria monocytogenes NOT DETECTED NOT DETECTED Final   Staphylococcus species DETECTED (A) NOT DETECTED Final    Comment: CRITICAL RESULT CALLED TO, READ BACK BY AND VERIFIED WITH: N BATCHELDER 08/08/15 @ 1249 M VESTAL    Staphylococcus aureus NOT DETECTED NOT DETECTED Final   Methicillin resistance DETECTED (A) NOT DETECTED Final    Comment: CRITICAL RESULT CALLED TO, READ BACK BY AND VERIFIED WITH: N BATCHELDER 08/08/15 @ 1249 M VESTAL    Streptococcus species NOT DETECTED NOT DETECTED Final   Streptococcus agalactiae NOT DETECTED NOT DETECTED Final   Streptococcus pneumoniae NOT DETECTED NOT DETECTED Final   Streptococcus pyogenes NOT DETECTED NOT DETECTED Final    Acinetobacter baumannii NOT DETECTED NOT DETECTED Final   Enterobacteriaceae species DETECTED (A) NOT DETECTED Final    Comment: CRITICAL RESULT CALLED TO, READ BACK BY AND VERIFIED WITH: N BATCHELDER 08/08/15 @ 1249 M VESTAL    Enterobacter cloacae complex NOT DETECTED NOT DETECTED Final   Escherichia coli NOT DETECTED NOT DETECTED Final   Klebsiella oxytoca NOT DETECTED NOT DETECTED Final   Klebsiella pneumoniae DETECTED (A) NOT DETECTED Final    Comment: CRITICAL RESULT CALLED TO, READ BACK BY AND VERIFIED WITH: N BATCHELDER 08/08/15 @ 1249 M VESTAL3    Proteus species NOT DETECTED NOT DETECTED Final   Serratia marcescens NOT DETECTED NOT DETECTED  Final   Carbapenem resistance NOT DETECTED NOT DETECTED Final   Haemophilus influenzae NOT DETECTED NOT DETECTED Final   Neisseria meningitidis NOT DETECTED NOT DETECTED Final   Pseudomonas aeruginosa NOT DETECTED NOT DETECTED Final   Candida albicans NOT DETECTED NOT DETECTED Final   Candida glabrata NOT DETECTED NOT DETECTED Final   Candida krusei NOT DETECTED NOT DETECTED Final   Candida parapsilosis NOT DETECTED NOT DETECTED Final   Candida tropicalis NOT DETECTED NOT DETECTED Final  Blood Culture (routine x 2)     Status: None   Collection Time: 08/07/15  8:33 PM  Result Value Ref Range Status   Specimen Description BLOOD RIGHT ARM  Final   Special Requests BOTTLES DRAWN AEROBIC AND ANAEROBIC 5CC  Final   Culture NO GROWTH 6 DAYS  Final   Report Status 08/13/2015 FINAL  Final  Culture, blood (routine x 2)     Status: None   Collection Time: 08/09/15  2:25 PM  Result Value Ref Range Status   Specimen Description BLOOD RIGHT HAND  Final   Special Requests IN PEDIATRIC BOTTLE .5CC  Final   Culture NO GROWTH 5 DAYS  Final   Report Status 08/14/2015 FINAL  Final  Culture, blood (routine x 2)     Status: None   Collection Time: 08/09/15  2:32 PM  Result Value Ref Range Status   Specimen Description BLOOD RIGHT ANTECUBITAL  Final    Special Requests BOTTLES DRAWN AEROBIC ONLY 5CC  Final   Culture NO GROWTH 5 DAYS  Final   Report Status 08/14/2015 FINAL  Final    Coagulation Studies:  Recent Labs  08/21/15 0409 08/22/15 0333  LABPROT 24.6* 25.8*  INR 2.24* 2.40*    Urinalysis: No results for input(s): COLORURINE, LABSPEC, PHURINE, GLUCOSEU, HGBUR, BILIRUBINUR, KETONESUR, PROTEINUR, UROBILINOGEN, NITRITE, LEUKOCYTESUR in the last 72 hours.  Invalid input(s): APPERANCEUR    Imaging: No results found.   Medications:     . antiseptic oral rinse  7 mL Mouth Rinse BID  . ciprofloxacin  500 mg Oral Q24H  . darbepoetin (ARANESP) injection - DIALYSIS  100 mcg Intravenous Q Mon-HD  . famotidine  20 mg Oral Q1200  . feeding supplement (NEPRO CARB STEADY)  237 mL Oral BID BM  . feeding supplement (PRO-STAT SUGAR FREE 64)  30 mL Oral BID  . insulin aspart  0-15 Units Subcutaneous TID WC  . metoCLOPramide  5 mg Oral TID AC  . midodrine  10 mg Oral TID WC  . multivitamin  1 tablet Oral QHS  . nystatin  5 mL Oral QID  . polyethylene glycol  17 g Oral Daily  . senna-docusate  1 tablet Oral BID  . sodium chloride flush  3 mL Intravenous Q12H  . Warfarin - Pharmacist Dosing Inpatient   Does not apply q1800   fentaNYL (SUBLIMAZE) injection, HYDROcodone-acetaminophen, ipratropium-albuterol, ondansetron (ZOFRAN) IV, ondansetron **OR** [DISCONTINUED] ondansetron (ZOFRAN) IV, polyvinyl alcohol, simethicone   Dialysis Orders: Center: [facility] on MWF . EDW 109.5 kg (but 6/30 left at 106.8 kg ) No sig change in bp/HD Bath 3k/ 2.25 Ca Time 4hrs Heparin none. Access R IJ perm cath and LUA AVF (07/25/15 inserted)  Mircera 42mcg q 2weeks (not given at op unit yet )  Other= OP labs 08/03/15= Transfer sat 22% Ferritin 1958 / Pth 65/ cor ca 9.5 phos 3.7 hgb 9.5  Assessment/ Plan:  1. Klebsiella sepsis/ UTI -primaxin changed to cipro;   Wbc normal   2. Vol overload/ pulm edema -  improved; cont to lower volume as tol-edema  is peripheral;  New EDW 98 3. ESRD - MWF as inpatient -HD Friday and Monday  tolerated HD in recliner - now being changed to TTS as OP in [facility]- will do 2 hour treatment today pre discharge then next will be at [facility] on Thursday.  Also now with AVF clotted- [facility] aware to arrange AVG as OP 4. 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 Nutrition alb 1.5 - liberalize diet to carb mod - to help with P support/intake - K not an issue at present; on renavits/prostat 8. Chronic coumadin - afib hx DVT, INR 2.24 10. DNR - palliative care helping 11. Debility - working towards SNF discharge; discharge today after HD    LOS: 16 Mikya Don A @TODAY @10 :49 AM

## 2015-08-23 NOTE — Progress Notes (Signed)
Patient d/c to [facility] SNF report called to nurse [facility], patient transported by [facility] services. Discharge packet faxed by MSW. Family at bedside aware of transfer.  Yuriko Portales, Tivis Ringer, RN

## 2015-08-24 ENCOUNTER — Non-Acute Institutional Stay (SKILLED_NURSING_FACILITY): Payer: Medicare Other | Admitting: Internal Medicine

## 2015-08-24 ENCOUNTER — Encounter: Payer: Self-pay | Admitting: Internal Medicine

## 2015-08-24 DIAGNOSIS — N189 Chronic kidney disease, unspecified: Secondary | ICD-10-CM

## 2015-08-24 DIAGNOSIS — D631 Anemia in chronic kidney disease: Secondary | ICD-10-CM

## 2015-08-24 DIAGNOSIS — N186 End stage renal disease: Secondary | ICD-10-CM

## 2015-08-24 DIAGNOSIS — J189 Pneumonia, unspecified organism: Secondary | ICD-10-CM | POA: Diagnosis not present

## 2015-08-24 DIAGNOSIS — A419 Sepsis, unspecified organism: Secondary | ICD-10-CM

## 2015-08-24 DIAGNOSIS — R6520 Severe sepsis without septic shock: Secondary | ICD-10-CM

## 2015-08-24 DIAGNOSIS — K3184 Gastroparesis: Secondary | ICD-10-CM

## 2015-08-24 DIAGNOSIS — A414 Sepsis due to anaerobes: Secondary | ICD-10-CM | POA: Diagnosis not present

## 2015-08-24 DIAGNOSIS — I824Z1 Acute embolism and thrombosis of unspecified deep veins of right distal lower extremity: Secondary | ICD-10-CM

## 2015-08-24 DIAGNOSIS — L8990 Pressure ulcer of unspecified site, unspecified stage: Secondary | ICD-10-CM | POA: Diagnosis not present

## 2015-08-24 DIAGNOSIS — A4159 Other Gram-negative sepsis: Secondary | ICD-10-CM

## 2015-08-24 DIAGNOSIS — E1143 Type 2 diabetes mellitus with diabetic autonomic (poly)neuropathy: Secondary | ICD-10-CM | POA: Diagnosis not present

## 2015-08-24 DIAGNOSIS — R490 Dysphonia: Secondary | ICD-10-CM

## 2015-08-24 DIAGNOSIS — I951 Orthostatic hypotension: Secondary | ICD-10-CM

## 2015-08-24 DIAGNOSIS — K219 Gastro-esophageal reflux disease without esophagitis: Secondary | ICD-10-CM | POA: Diagnosis not present

## 2015-08-24 DIAGNOSIS — R532 Functional quadriplegia: Secondary | ICD-10-CM

## 2015-08-24 DIAGNOSIS — I480 Paroxysmal atrial fibrillation: Secondary | ICD-10-CM | POA: Diagnosis not present

## 2015-08-24 NOTE — Progress Notes (Signed)
MRN: [id number] Name: [name]  Sex: male Age: 75 y.o. DOB: 1940-04-08  [place] #:  Facility/Room: [place] / 114 P Level Of Care: SNF Provider: Noah Delaine. Sheppard Coil, MD Emergency Contacts: Extended Emergency Contact Information Primary Emergency Contact: Bott,[name] Address: [address]          [place], [place] [address] [place] Phone: [phone] Relation: Spouse  Code Status: DNR  Allergies: Review of patient's allergies indicates no known allergies.  Chief Complaint  Patient presents with  . New Admit To SNF    Admit to Facility    HPI: Patient is 75 y.o. male with new ESRD MWF ([place] 3 op hd txs last on 08/05/15 uneventful except left below edw 109.5 At 106.8) Ho prostate CA s/p radiation 2011 and RCC s/p left nephrectomy and most recently extensive admit 05/31/15 > 07/31/15 for multiple problems including but not limited to Klebsiella bacteremia with septic shock with Pyelonephritis/UTI complicated by hemorrhagic shock due to coagulopathy with subsequent cardiac arrest, complicated by left pneumothorax following CPR s/p chest tube placement.   Now brought from [facility] to ER Secondary to fatigue, somnolence, fever. Pt was admitted to [facility] from 7/2-18 where he was treated for septic shock 2/2 UTI requiring ICU and IV albumin, later tx to floor where he was felt to have developed PNA. Pt is admitted to SNF for generalized weakness for OT/PT. While at SNF pt will be followed for AF, tx with coumadin,anemia of chronic dx tr with iron and hypotension, tx with midodrine.  Past Medical History  Diagnosis Date  . Obesity   . Phlebitis     Lower extermity  . Pulmonary emboli ([place]) 2008    submassive, saddle  . Prostate cancer ([place]) 07/2009  . Sleep apnea     on CPAP  . Hx of echocardiogram 12/04/2010    Normal EF >55% no significant valve disease  . History of stress test 06/27/2009    Low risk and EF of approximately 50%  . DVT (deep venous  thrombosis) ([place])   . Chronic kidney disease, stage 3     baseline creatinine ~1.4  . HLD (hyperlipidemia)   . HTN (hypertension)   . Dysrhythmia     A fib  . Diabetes mellitus ([place])     diet controlled  . History of hiatal hernia     Past Surgical History  Procedure Laterality Date  . Nephrectomy  1999    for CA  . Cholecystectomy  1999  . Transurethral resection of bladder tumor N/A 03/04/2013    Procedure: CYSTOSCOPY WITH RIGHT RETROGRADE PYELOGRAM AND BLADDER BIOPSY /CLOT EVACUATION/ BIOPSY PROSTATIC URETHRA WITH FULGERATION ;  Surgeon: Molli Hazard, MD;  Location: [facility];  Service: Urology;  Laterality: N/A;  . Cystoscopy w/ retrogrades N/A 04/08/2013    Procedure: CYSTOSCOPY WITH CLOT EVACUATION;  Surgeon: Molli Hazard, MD;  Location: [facility];  Service: Urology;  Laterality: N/A;  . Left shoulder repair    . Shoulder open rotator cuff repair Right 05/03/2015    Procedure: RIGHT SHOULDER ROTATOR CUFF REPAIR OPEN WITH GRAFT AND ANCHOR ;  Surgeon: Latanya Maudlin, MD;  Location: [facility];  Service: Orthopedics;  Laterality: Right;  . Av fistula placement Left 07/25/2015    Procedure: ARTERIOVENOUS (AV) FISTULA CREATION;  Surgeon: Elam Dutch, MD;  Location: [place];  Service: Vascular;  Laterality: Left;  Axillary block with minimal sedation and supplemental local at operative site.      Medication  List       This list is accurate as of: 08/24/15 11:59 PM.  Always use your most recent med list.               feeding supplement (NEPRO CARB STEADY) Liqd  Take 237 mLs by mouth 2 (two) times daily between meals.     feeding supplement (PRO-STAT SUGAR FREE 64) Liqd  Take 30 mLs by mouth 2 (two) times daily.     fenofibrate 145 MG tablet  Commonly known as:  TRICOR  Take 145 mg by mouth daily.     ferrous sulfate 325 (65 FE) MG EC tablet  take 1 tablet by mouth once daily     guaiFENesin 100 MG/5ML Soln  Commonly known as:  ROBITUSSIN  Take 10 mLs (200 mg  total) by mouth every 8 (eight) hours.     HYDROcodone-acetaminophen 5-325 MG tablet  Commonly known as:  NORCO/VICODIN  Take 1 tablet by mouth every 8 (eight) hours as needed for moderate pain or severe pain.     insulin aspart 100 UNIT/ML injection  Commonly known as:  novoLOG  CBG 121 - 150:2u, CBG 151 - 200:3u, CBG 201 - 250:5u, CBG 251 - 300:8u,CBG 301 - 350:11u, CBG 351 - 400:15u     ipratropium-albuterol 0.5-2.5 (3) MG/3ML Soln  Commonly known as:  DUONEB  Take 3 mLs by nebulization every 4 (four) hours as needed (shortness of breath/ wheezing).     menthol-cetylpyridinium 3 MG lozenge  Commonly known as:  CEPACOL  Take 1 lozenge (3 mg total) by mouth as needed for sore throat.     metoCLOPramide 5 MG tablet  Commonly known as:  REGLAN  Take 1 tablet (5 mg total) by mouth 3 (three) times daily before meals.     midodrine 10 MG tablet  Commonly known as:  PROAMATINE  Take 1 tablet (10 mg total) by mouth 3 (three) times daily before meals.     multivitamin Tabs tablet  Take 1 tablet by mouth at bedtime.     ondansetron 4 MG tablet  Commonly known as:  ZOFRAN  Take 4 mg by mouth every 6 (six) hours as needed for nausea or vomiting.     pantoprazole 40 MG tablet  Commonly known as:  PROTONIX  Take 40 mg by mouth daily.     polyethylene glycol packet  Commonly known as:  MIRALAX / GLYCOLAX  Take 17 g by mouth daily.     polyvinyl alcohol 1.4 % ophthalmic solution  Commonly known as:  LIQUIFILM TEARS  Place 1 drop into both eyes 2 (two) times daily as needed for dry eyes.     PRESCRIPTION MEDICATION  Inhale into the lungs at bedtime. CPAP     senna-docusate 8.6-50 MG tablet  Commonly known as:  Senokot-S  Take 1 tablet by mouth 2 (two) times daily.     warfarin 5 MG tablet  Commonly known as:  COUMADIN  Take 1 tablet (5 mg total) by mouth one time only at 6 PM.        Meds ordered this encounter  Medications  . pantoprazole (PROTONIX) 40 MG tablet     Sig: Take 40 mg by mouth daily.     There is no immunization history on file for this patient.  Social History  Substance Use Topics  . Smoking status: Never Smoker   . Smokeless tobacco: Never Used  . Alcohol Use: 0.0 oz/week    0 Standard drinks or  equivalent per week     Comment: occasional beer or wine    Family history is   Family History  Problem Relation Age of Onset  . Cancer Mother   . Diabetes Father   . Cancer Father       Review of Systems  DATA OBTAINED: from patient, nurse, medical record, family member GENERAL:  no fevers, fatigue, appetite changes SKIN: No itching, rash; wounds about R lateral leg EYES: No eye pain, redness, discharge EARS: No earache, tinnitus, change in hearing NOSE: No congestion, drainage or bleeding  MOUTH/THROAT: No mouth or tooth pain, No sore throat RESPIRATORY: No cough, wheezing, SOB CARDIAC: No chest pain, palpitations, lower extremity edema  GI: No abdominal pain, No N/V/D or constipation, No heartburn or reflux  GU: No dysuria, frequency or urgency, or incontinence  MUSCULOSKELETAL: No unrelieved bone/joint pain NEUROLOGIC: No headache, dizziness or focal weakness PSYCHIATRIC: No c/o anxiety or sadness   Filed Vitals:   08/24/15 0848  BP: 85/69  Pulse: 82  Temp: 97.5 F (36.4 C)  Resp: 18    SpO2 Readings from Last 1 Encounters:  08/23/15 98%        Physical Exam  GENERAL APPEARANCE: Alert, conversant,  No acute distress.  SKIN: No diaphoresis rash; dusky colored ext due to chronic venous stasis, 2 dressings reporte covering superficial wounds that are weeping HEAD: Normocephalic, atraumatic  EYES: Conjunctiva/lids clear. Pupils round, reactive. EOMs intact.  EARS: External exam WNL, canals clear. Hearing grossly normal.  NOSE: No deformity or discharge.  MOUTH/THROAT: Lips w/o lesions  RESPIRATORY: Breathing is even, unlabored. Lung sounds are clear   CARDIOVASCULAR: Heart RRR no murmurs, rubs or  gallops. + peripheral edema.   GASTROINTESTINAL: Abdomen is soft, non-tender, not distended w/ normal bowel sounds. GENITOURINARY: Bladder non tender, not distended  MUSCULOSKELETAL: No abnormal joints or musculature NEUROLOGIC:  Cranial nerves 2-12 grossly intact. Moves all extremities  PSYCHIATRIC: Mood and affect appropriate to situation, no behavioral issues  Patient Active Problem List   Diagnosis Date Noted  . GERD (gastroesophageal reflux disease) 08/27/2015  . Diabetic gastroparesis ([place]) 08/27/2015  . Encounter for hospice care discussion   . Palliative care encounter   . Goals of care, counseling/discussion   . Severe sepsis ([place])   . CKD (chronic kidney disease)   . Pressure ulcer 08/08/2015  . Acute respiratory failure with hypoxia ([place])   . UTI (lower urinary tract infection)   . ESRD (end stage renal disease) ([place])   . Hypocalcemia   . Acute encephalopathy   . Chronic anemia   . Fecal occult blood test positive   . HCAP (healthcare-associated pneumonia)   . Sepsis ([place])   . Elevated lactic acid level   . Elevated troponin   . Sepsis secondary to UTI ([place]) 08/07/2015  . Hypoalbuminemia 07/27/2015  . Pneumothorax   . Traumatic pneumothorax   . OSA on CPAP   . Acute on chronic renal failure ([place])   . Hemorrhagic shock   . S/p nephrectomy   . Sepsis due to Klebsiella ([place])   . OSA (obstructive sleep apnea)   . Chronic diastolic CHF (congestive heart failure) ([place])   . Cardiac arrest ([place])   . Acute deep vein thrombosis (DVT) of distal vein of right lower extremity ([place])   . Acute renal failure (ARF) ([place])   . Acute hypoxemic respiratory failure ([place])   . Acute back pain   . Bleeding   . Chest tube in place   .  Perinephric hematoma   . Right flank pain   . Acute respiratory failure ([place])   . Bacteremia due to Klebsiella pneumoniae   . Secondary cardiomyopathy ([place])   . Chronic atrial fibrillation ([place])   . Uncontrolled type 2 diabetes mellitus with  complication ([place])   . Dyspnea   . Acute renal failure ([place]) 06/02/2015  . Encounter for central line placement   . Septic shock ([place]) 05/31/2015  . Renal failure   . Urinary tract infectious disease   . Arterial hypotension   . Increased anion gap metabolic acidosis   . Hyponatremia   . Rotator cuff tear 05/03/2015  . Atrial fibrillation ([place]) 11/10/2014  . Obesity (BMI 30-39.9) 11/10/2014  . Type II or unspecified type diabetes mellitus with unspecified complication, uncontrolled 04/06/2013  . Anemia 04/04/2013  . 1St degree AV block 03/07/2013  . Acute blood loss anemia 03/06/2013  . Acute-on-chronic kidney injury, baseline CKD stage 3 03/06/2013  . Hypotension, unspecified 03/06/2013  . Bradycardia 03/06/2013  . Perineal pain 03/06/2013  . Preoperative clearance 03/03/2013  . Gross hematuria 03/02/2013  . Severe obstructive sleep apnea 12/04/2012  . Morbid obesity ([place]) 12/04/2012  . Bilateral lower extremity edema 12/04/2012  . Venous insufficiency 12/04/2012  . History of nephrectomy, unilateral 12/04/2012  . Chronic anticoagulation 12/04/2012  . History of pulmonary embolism 12/04/2012  . Essential hypertension 12/04/2012  . Cardiac murmur 12/04/2012       Component Value Date/Time   WBC 7.3 08/21/2015 0409   WBC 8.3 08/07/2015   RBC 2.91* 08/21/2015 0409   RBC 2.67* 04/04/2013 0013   HGB 8.0* 08/21/2015 0409   HCT 26.1* 08/21/2015 0409   PLT 273 08/21/2015 0409   MCV 89.7 08/21/2015 0409   LYMPHSABS 0.8 08/12/2015 0400   MONOABS 0.4 08/12/2015 0400   EOSABS 0.0 08/12/2015 0400   BASOSABS 0.0 08/12/2015 0400        Component Value Date/Time   NA 137 08/23/2015 1440   NA 133* 08/22/2015   K 3.7 08/23/2015 1440   CL 101 08/23/2015 1440   CO2 27 08/23/2015 1440   GLUCOSE 95 08/23/2015 1440   BUN 20 08/23/2015 1440   BUN 61* 08/22/2015   CREATININE 1.92* 08/23/2015 1440   CREATININE 4.0* 08/22/2015   CALCIUM 8.4* 08/23/2015 1440   PROT 5.2* 08/07/2015  1957   ALBUMIN 2.0* 08/23/2015 1440   AST 29 08/07/2015 1957   ALT 20 08/07/2015 1957   ALKPHOS 115 08/07/2015 1957   BILITOT 0.8 08/07/2015 1957   GFRNONAA 33* 08/23/2015 1440   GFRAA 38* 08/23/2015 1440    Lab Results  Component Value Date   HGBA1C 7.8* 06/07/2015    Lab Results  Component Value Date   CHOL 80 06/07/2015   HDL 16* 06/07/2015   LDLCALC 45 06/07/2015   TRIG 93 06/07/2015   CHOLHDL 5.0 06/07/2015     Dg Chest 1 View  08/08/2015  CLINICAL DATA:  75 year old male with fever and lethargy. Possible Healthcare associated pneumonia. EXAM: CHEST 1 VIEW COMPARISON:  08/07/2015 and earlier. FINDINGS: Portable AP supine view at 0459 hours. Stable right IJ dual-lumen dialysis type catheter. Stable cardiac size and mediastinal contours. Stable lung volumes. Interval increased opacity at the left lung base, now mostly obscuring the left hemidiaphragm. Stable ventilation elsewhere. No pneumothorax. No acute pulmonary edema. Left lateral fourth rib deformity is stable since May. Healing left seventh and eighth rib fractures again noted. No new osseous abnormality identified. IMPRESSION: 1. Increasing opacity at the  left lung base which could reflect pneumonia or small pleural effusion with atelectasis in this setting. 2. No other acute cardiopulmonary abnormality. Electronically Signed   By: Genevie Ann M.D.   On: 08/08/2015 07:13   Dg Chest Port 1 View  08/07/2015  CLINICAL DATA:  Fever and lethargy. EXAM: PORTABLE CHEST 1 VIEW COMPARISON:  07/20/2015 FINDINGS: Right IJ dialysis catheter unchanged with tip over the SVC. Lungs are hypoinflated with mild hazy prominence of the perihilar vessels suggesting mild vascular congestion. Moderate stable cardiomegaly. Fractures of the left lateral third and fourth ribs unchanged. Possible fracture of the anterior left second rib. Remainder of the exam is unchanged. IMPRESSION: Cardiomegaly with suggestion of mild vascular congestion. Known left-sided  rib fractures as described. Electronically Signed   By: Marin Olp M.D.   On: 08/07/2015 20:32    Not all labs, radiology exams or other studies done during hospitalization come through on my EPIC note; however they are reviewed by me.    Assessment and Plan  Sepsis with shock secondary to UTI - Was on Imipenem, this has been transitioned to Ciprofloxacin, total of 14 days planned, stopped 08/21/15  - 08/07/15: UC + for klebsiella - BC X 3 NGTD - Afebrile, BP still low, but he is debilitated and mostly bedbound - currently on midodrine, this is a chronic issue - Nausea therapy with Zofran SNF - admitted for generalized weakness for OT/PT   HCAP (healthcare-associated pneumonia) - Left lung, seen on CXR when admitted  - Currently not requiring O2 - Improved with above Abx  H/O Afib on coumadin/DVT - Continue coumadin per pharmacy - INR is therapeutic at 2.24 today SNF - cont coumadin which will be actively tirated  ESRD, Monday Wednesday Friday, tolerating hemodialysis in recliner - Nephrology following, receiving HD in recliner, SNF - AVF appears to be clotted- Dr Donnetta Hutching Recommends left arm prosthetic graft; will  arrange for outpatient appointment with [facility]; Continue aranesp, midodrine  Stage 2 Pressure ulcer - Continue barrier cream, has been evaluated by wound care, continue to monitor would not recommend dressings because they will continue to get soiled. Would recommend barrier cream instead of dressings to be applied each shift and after each episode of incontinence SNF - wound care nurse to follow  Anemia of chronic disease - No signs of bleeding - Hgb stable , 8.5 prior to discharge SNF - f/u CBC in 5 days; cont iron daily  Acute functional quadriplegia and weakness - In the setting of multiple hospitalizations, ICU time and multiple medical problems -SNF - rehab planned.   Decreased phonation - Reports that this is due to feeding tube being used during  last hospitalization, improving per patient. He has been evaluated by SLP and felt to be a low aspiration risk.  Currently on regular diet, thin liquids  SNF - Regular;Thin liquid as per assessment on 7/15; ST at SNF will evaluate  Arterial hypotension SNF - cont midodrine 10 mg TID  GERD (gastroesophageal reflux disease) SNF - controlled;cont protonix 40 mg BICD  Diabetic gastroparesis (HCC) SNF - cont reglan 10 mg with meals   Time spent > 45 min;> 50% of time with patient was spent reviewing records, labs, tests and studies, counseling and developing plan of care  Webb Silversmith D. Sheppard Coil, MD

## 2015-08-25 ENCOUNTER — Telehealth: Payer: Self-pay | Admitting: Pulmonary Disease

## 2015-08-25 ENCOUNTER — Other Ambulatory Visit: Payer: Self-pay

## 2015-08-25 NOTE — Telephone Encounter (Signed)
lmomtcb x 2 for pts wife 

## 2015-08-25 NOTE — Telephone Encounter (Signed)
lmomtcb x 1 for pts wife.

## 2015-08-25 NOTE — Telephone Encounter (Signed)
Pt wife returning call needing more info.[name]

## 2015-08-26 NOTE — Telephone Encounter (Signed)
I saw him while he was in hospital, and last saw him on 06/26/15.  I have not seen him since then, and he has not been seen by me in office.  I don't have any additional information beyond this.  Please check what information they are requesting.  This might need to be referred to hospital providers since it appears he was recently readmitted.

## 2015-08-26 NOTE — Telephone Encounter (Signed)
Called and spoke with pts wife and she stated that [employer] will need some updated information faxed over so the pt will be able to get long term disability---  She stated that the fax # is [phone] Claim # [id number]  VS please advise. thanks

## 2015-08-26 NOTE — Telephone Encounter (Signed)
Spoke with patient's wife, she said that patient's short term disability has run out and he is now needing long term disability forms completed.  Advised her that all of the disability forms need to be faxed to [facility] at [phone]; she said she would have [employer] those to [place].  Will send message to [facility] to look for forms.

## 2015-08-26 NOTE — Telephone Encounter (Signed)
Pt wife returning call.[name]' °

## 2015-08-27 ENCOUNTER — Encounter: Payer: Self-pay | Admitting: Internal Medicine

## 2015-08-27 DIAGNOSIS — K3184 Gastroparesis: Secondary | ICD-10-CM

## 2015-08-27 DIAGNOSIS — R490 Dysphonia: Secondary | ICD-10-CM | POA: Insufficient documentation

## 2015-08-27 DIAGNOSIS — K219 Gastro-esophageal reflux disease without esophagitis: Secondary | ICD-10-CM | POA: Insufficient documentation

## 2015-08-27 DIAGNOSIS — R532 Functional quadriplegia: Secondary | ICD-10-CM | POA: Insufficient documentation

## 2015-08-27 DIAGNOSIS — E1143 Type 2 diabetes mellitus with diabetic autonomic (poly)neuropathy: Secondary | ICD-10-CM | POA: Insufficient documentation

## 2015-08-27 NOTE — Assessment & Plan Note (Signed)
SNF - controlled;cont protonix 40 mg BICD

## 2015-08-27 NOTE — Assessment & Plan Note (Signed)
SNF - cont reglan 10 mg with meals

## 2015-08-27 NOTE — Assessment & Plan Note (Signed)
SNF - cont midodrine 10 mg TID

## 2015-08-29 ENCOUNTER — Telehealth: Payer: Self-pay | Admitting: *Deleted

## 2015-08-29 NOTE — Telephone Encounter (Signed)
[id number]/[name],BSN,RN3,CCM,CN: TCT-wife [name]/patient has been moved to [employer].  Doing better and improving every day, was able to get up out of bed today and go into the rehab room.  At some point patient has had a stroke and it has effected his speech and cognition.  Goal is to get patient back home.  [facility] is very encouraging but is also being followed by the palliative care team who has told him that if he can not get dialysis due to body complications the future is poor.  Wife is trying to set goals for the patient and distance herself in order for the patient to be more independent.  Patient is very dependant upon the wife and she is trying to get him to an optimal point.  Did ask her about herself she is tired but doing well.  Patient is not wanting to eat due to not being hungry.  Does enjoy breakfast and does not do well with lunch or dinner.  Staff is watching his weight and he does get medicine for nausea.  Wife does state that he is still having bouts of hypotension during hemodialysis.  She is happy that one of the staff doctors did introduce herself to them. This did not happen at the other snf. Wife does have good family and friend support system.

## 2015-08-30 ENCOUNTER — Encounter (HOSPITAL_COMMUNITY): Payer: Self-pay | Admitting: Vascular Surgery

## 2015-08-30 ENCOUNTER — Encounter (HOSPITAL_COMMUNITY): Payer: Self-pay | Admitting: *Deleted

## 2015-08-30 MED ORDER — DEXTROSE 5 % IV SOLN: 1.5000 g | INTRAVENOUS | Status: DC

## 2015-08-30 MED ORDER — SODIUM CHLORIDE 0.9 % IV SOLN: INTRAVENOUS | Status: DC

## 2015-08-30 NOTE — Progress Notes (Addendum)
I spoke with Mrs Letts and informed her of new arrival time of 76.  Mrs Vanderweide reported that patient has not been wearing CPAP at HS as ordered and is sometimes confused in am, she thinks due to that. Mrs Weidner did not have any questions.  I spoke with Monette patient's nurse for history update and I faxed her instructions for arrival time, medications to take and NPO status. NO DM medications in am. I instructed Monette  to check have CBG checked  and if it is less than 70 to treat it with Glucose Gel, Glucose tablets or 1/2 cup of clear juice like apple juice or cranberry juice, or 1/2 cup of regular soda. (not cream soda). I instructed Monette to recheck CBG in 15 minutes and if CBG is not greater than 70, to  Call [phone] (pre- op). If it is before pre-op opens to retreat as before and recheck CBG in 15 minutes. I told MOnette to make note of time that liquid is taken and amount, that surgical time may have to be adjusted.

## 2015-08-30 NOTE — Progress Notes (Signed)
Anesthesia Chart Review: SAME DAY WORK-UP.  Patient is a 76 year old male scheduled for insertion of LUE AVGG on 08/31/15 by Dr. Bridgett Larsson.  History includes ESRD (HD TTS; via right IJ tunneled catheter) s/p left brachiocephalic AVF [id number], RCC s/p left nephrectomy '99, non-smoker, DVT/PE '08, prostate cancer s/p radiation '11, OSA on CPAP, HLD, HTN, afib, DM2, hiatal hernia, cholecystectomy, obesity. - Admission 05/31/15 - 07/31/15 for Klebsiella bacteremia with septic shock with Pyelonephritis/UTI complicated by hemorrhagic shock (perinephric hematoma s/p coil embolization of right lower pole bleeding site) due to coagulopathy  with subsequent cardiac arrest s/p CPR, epinephrine, bicarbonate and calcium chloride 06/08/15 (in the setting of septic shock on Levophed; anemia felt to be contributing factor; Hgb 7.9 with K 6.0, serum bicarb 14), complicated by left pneumothorax following CPR s/p chest tube placement. He had prolonged intubation. He was treated for right gastrocnemius DVT (06/23/15) with fight femoral vein and popliteal vein DVT of undetermined age and cannot exclude DVT of the right PT and peroneal veins. He has known afib/PAF history.    - Admission 08/07/15 - 08/23/15 for septic shock due to UTI. Discharged to [facility] for generalized weakness and need for PT/OT. AVF noted to be clotted during admission, so out-patient LUE AVGG planned.   PCP is Dr. Melinda Crutch. Urologist is Dr. Louis Meckel. ENT is Dr. Simeon Craft. Cardiologist is Dr. Shelva Majestic.   Med list needs to be updated, but currently includes Tricor, 65 FE, Novolog, Duoneb, midodrine, Reglan, Protonix, warfarin (to hold 5 days before surgery).  08/07/15 EKG: afib at 98 bpm, paired PVCs, borderline LAD, low voltage precordial leads, repolarization abnormality suggests lateral ischemia.  06/28/15 Echo: Study Conclusions - Left ventricle: The cavity size was normal. Wall thickness was   increased in a pattern of moderate LVH.  Systolic function was   vigorous. The estimated ejection fraction was in the range of 65%   to 70%. Wall motion was normal; there were no regional wall   motion abnormalities. - Left atrium: The atrium was mildly dilated.  04/29/15 Nuclear stress test (done at part of pre-op evaluation before shoulder surgery):  There was no ST segment deviation noted during stress.  This is a low risk study. No ischemia identified.  Study was not gated.  08/12/15 1V CXR: IMPRESSION: 1. Right IJ line in stable position. 2. Persistent left base mild atelectasis and or infiltrate. Small left pleural effusion . 3. Stable cardiomegaly. 4. Old left posterior lateral fourth rib fracture. No pneumothorax .  He will need labs on arrival.  Most recently evaluated at [facility] by Dr. Inocencio Homes on 08/24/15 who is aware of surgery plans. He is still having issues with hypotension (85/69 08/24/15, 92/61 08/23/15) and is requiring hemodialysis in a reclining chair. Discussed with anesthesiologist Dr. Ermalene Postin. Patient has a complicated history and is a same day work-up. Patient He did recent low risk stress test and normal EF by echo, but subsequent admissions for sepsis and cardiac arrest in the setting of hematoma/anemia. He did tolerated AVF (under regional anesthesia) in 07/2015. Will be further evaluated on the day of surgery and definitive plan made at that time.  George Hugh [facility] Short Stay Center/Anesthesiology Phone [phone] 08/30/2015 2:12 PM

## 2015-08-30 NOTE — Pre-Procedure Instructions (Signed)
    [name]  08/30/2015     Your procedure is scheduled on Wednesday, July 26.  Report to [facility] Admitting at 10:15 A.M.  Call this number if you have problems the morning of surgery:[phone]   Remember:  Do not eat food or drink liquids after midnight.  Take these medicines the morning of surgery with A SIP OF WATER :Protonix, Reglan, Midorine.  May have a nebulizer treatmsnt if needed.  May have Norco if needed and tolerates on an empty stomach.   . Do not take oral diabetes medicines (pills) the morning of surgery.  . If your CBG is greater than 220 mg/dL, you may take  of your sliding scale (correction) dose of insulin.   How do I manage my blood sugar before surgery? . Check your blood sugar at least 4 times a day, starting 2 days before surgery, to make sure that the level is not too high or low. o Check your blood sugar the morning of your surgery when you wake up and every 2 hours until you get to the Short Stay unit. . If your blood sugar is less than 70 mg/dL, you will need to treat for low blood sugar: o Do not take insulin. o Treat a low blood sugar (less than 70 mg/dL) with  cup of clear juice (cranberry or apple), 4 glucose tablets, OR glucose gel. o Recheck blood sugar in 15 minutes after treatment (to make sure it is greater than 70 mg/dL). If your blood sugar is not greater than 70 mg/dL on recheck, call [phone] for further instructions. . Report your blood sugar to the short stay nurse when you get to Short Stay.       Do not wear jewelry, make-up or nail polish.  Do not wear lotions, powders, or perfumes.  You may wear deoderant.  Do not shave 48 hours prior to surgery.  Men may shave face and neck.  Do not bring valuables to the hospital.  [facility] is not responsible for any belongings or valuables. Marland Kitchen

## 2015-08-30 NOTE — Progress Notes (Signed)
I spoke with Scot Dock, patient's nurse at [facility] and patients wife and informed them of new arrival time of 1200, for OR at 54

## 2015-08-31 ENCOUNTER — Ambulatory Visit (HOSPITAL_COMMUNITY)
Admission: RE | Admit: 2015-08-31 | Discharge: 2015-08-31 | Disposition: A | Discharge status: Skilled Nursing Facility | Payer: BLUE CROSS/BLUE SHIELD | Source: Ambulatory Visit | Attending: Vascular Surgery | Admitting: Vascular Surgery

## 2015-08-31 ENCOUNTER — Encounter (HOSPITAL_COMMUNITY)
Admission: RE | Disposition: A | Discharge status: Skilled Nursing Facility | Payer: Self-pay | Source: Ambulatory Visit | Attending: Vascular Surgery

## 2015-08-31 ENCOUNTER — Encounter (HOSPITAL_COMMUNITY): Payer: Self-pay | Admitting: Anesthesiology

## 2015-08-31 DIAGNOSIS — G4730 Sleep apnea, unspecified: Secondary | ICD-10-CM | POA: Diagnosis not present

## 2015-08-31 DIAGNOSIS — E1122 Type 2 diabetes mellitus with diabetic chronic kidney disease: Secondary | ICD-10-CM | POA: Diagnosis not present

## 2015-08-31 DIAGNOSIS — I959 Hypotension, unspecified: Secondary | ICD-10-CM | POA: Diagnosis not present

## 2015-08-31 DIAGNOSIS — Z794 Long term (current) use of insulin: Secondary | ICD-10-CM | POA: Diagnosis not present

## 2015-08-31 DIAGNOSIS — T82898A Other specified complication of vascular prosthetic devices, implants and grafts, initial encounter: Secondary | ICD-10-CM | POA: Diagnosis present

## 2015-08-31 DIAGNOSIS — Z992 Dependence on renal dialysis: Secondary | ICD-10-CM | POA: Diagnosis not present

## 2015-08-31 DIAGNOSIS — Z905 Acquired absence of kidney: Secondary | ICD-10-CM | POA: Insufficient documentation

## 2015-08-31 DIAGNOSIS — Y832 Surgical operation with anastomosis, bypass or graft as the cause of abnormal reaction of the patient, or of later complication, without mention of misadventure at the time of the procedure: Secondary | ICD-10-CM | POA: Insufficient documentation

## 2015-08-31 DIAGNOSIS — N186 End stage renal disease: Secondary | ICD-10-CM | POA: Insufficient documentation

## 2015-08-31 DIAGNOSIS — Z86718 Personal history of other venous thrombosis and embolism: Secondary | ICD-10-CM | POA: Insufficient documentation

## 2015-08-31 DIAGNOSIS — E785 Hyperlipidemia, unspecified: Secondary | ICD-10-CM | POA: Insufficient documentation

## 2015-08-31 DIAGNOSIS — I120 Hypertensive chronic kidney disease with stage 5 chronic kidney disease or end stage renal disease: Secondary | ICD-10-CM | POA: Diagnosis not present

## 2015-08-31 DIAGNOSIS — Z8546 Personal history of malignant neoplasm of prostate: Secondary | ICD-10-CM | POA: Insufficient documentation

## 2015-08-31 DIAGNOSIS — Z7901 Long term (current) use of anticoagulants: Secondary | ICD-10-CM | POA: Insufficient documentation

## 2015-08-31 DIAGNOSIS — Z86711 Personal history of pulmonary embolism: Secondary | ICD-10-CM | POA: Insufficient documentation

## 2015-08-31 HISTORY — DX: Disorientation, unspecified: R41.0

## 2015-08-31 LAB — APTT
aPTT: >200 seconds (ref 24–36)
aPTT: 144 seconds — ABNORMAL HIGH (ref 24–36)

## 2015-08-31 LAB — GLUCOSE, CAPILLARY
GLUCOSE-CAPILLARY: 111 mg/dL — AB (ref 65–99)
GLUCOSE-CAPILLARY: 104 mg/dL — AB (ref 65–99)

## 2015-08-31 LAB — POCT I-STAT 4, (NA,K, GLUC, HGB,HCT)
Glucose, Bld: 122 mg/dL — ABNORMAL HIGH (ref 65–99)
HCT: 25 % — ABNORMAL LOW (ref 39.0–52.0)
HEMOGLOBIN: 8.5 g/dL — AB (ref 13.0–17.0)
POTASSIUM: 3.3 mmol/L — AB (ref 3.5–5.1)
SODIUM: 140 mmol/L (ref 135–145)

## 2015-08-31 LAB — PROTIME-INR
INR: >10
INR: 6.8
PROTHROMBIN TIME: 84 s — AB (ref 11.4–15.2)
Prothrombin Time: 61.2 seconds — ABNORMAL HIGH (ref 11.4–15.2)

## 2015-08-31 SURGERY — INSERTION OF ARTERIOVENOUS (AV) GORE-TEX GRAFT ARM
Anesthesia: Monitor Anesthesia Care | Laterality: Left

## 2015-08-31 MED ORDER — CHLORHEXIDINE GLUCONATE CLOTH 2 % EX PADS: 6.0000 | MEDICATED_PAD | Freq: Once | CUTANEOUS | Status: DC

## 2015-08-31 MED ORDER — SODIUM CHLORIDE 0.9 % IV SOLN: INTRAVENOUS | Status: DC

## 2015-08-31 MED ORDER — FENTANYL CITRATE (PF) 250 MCG/5ML IJ SOLN
INTRAMUSCULAR | Status: AC
  Filled 2015-08-31: qty 5

## 2015-08-31 NOTE — Progress Notes (Deleted)
Order from [place] to use [facility] tec cath for labs, iv

## 2015-08-31 NOTE — Interval H&P Note (Signed)
[facility]  History and Physical Update  Lab Results  Component Value Date   INR 6.80 (HH) 08/31/2015   INR >10.00 (HH) 08/31/2015   INR 2.24 (H) 08/23/2015   - Repeat INR via L hand demonstrates INR 6.8 -> too high to proceed - Hold Coumadin until Monday - Rescheduled AVG for Monday  Adele Barthel, MD [facility] [place] Office: [phone] Pager: [phone]  08/31/2015, 4:43 PM

## 2015-08-31 NOTE — Progress Notes (Signed)
CRITICAL VALUE ALERT  Critical value received:  PTT (144), PT (61.2), INR (6.8)  Date of notification: 08/31/15  Time of notification:  [id number]  Critical value read back: Yes  Nurse who received alert: Connye Burkitt, RN  MD notified (1st page):  Dr. Annye Asa, MD  Time of first page:  1540  MD notified (2nd page):  Time of second page:  Responding MD:  Dr. Annye Asa, MD  Time MD responded:  [phone]

## 2015-08-31 NOTE — H&P (View-Only) (Signed)
Hospital Consult    Reason for Consult:  Clotted fistula Referring Physician:  Owens Shark MRN #:  [id number]  History of Present Illness: This is a 75 y.o. male left brachial-cephalic AV fistula creation on 07/25/15 by Dr. Oneida Alar. He is currently dialyzing via right IJ [facility] on Mondays, Wednesdays and Fridays.   The patient had a recent prolonged admission from 05/31/15 through 07/31/15 for multiple issues including septic shock secondary to Klebsiella bacteremia, UTI/pyelonephritis, hemorrhagic shock due to right perinephric hematoma s/p embolization resulting in cardiac arrest, post CPR pneumothorax requiring chest tube placement and acute kidney injury leading to ESRD. He was discharged to a SNF and presented to the [facility] ED on 08/07/15 due to fatigue, somnolence and fever. He was found to have a UTI. He developed hypotension and then admitted to the ICU for septic shock.   His PMH includes PE and afib on coumadin, prior nephrectomy, prostate CA, diabetes and sleep apnea.   He is on coumadin and INR today is 2.4.    Past Medical History  Diagnosis Date  . Obesity   . Phlebitis     Lower extermity  . Pulmonary emboli ([place]) 2008    submassive, saddle  . Prostate cancer ([place]) 07/2009  . Sleep apnea     on CPAP  . Hx of echocardiogram 12/04/2010    Normal EF >55% no significant valve disease  . History of stress test 06/27/2009    Low risk and EF of approximately 50%  . DVT (deep venous thrombosis) ([place])   . Chronic kidney disease, stage 3     baseline creatinine ~1.4  . HLD (hyperlipidemia)   . HTN (hypertension)   . Dysrhythmia     A fib  . Diabetes mellitus ([place])     diet controlled  . History of hiatal hernia     Past Surgical History  Procedure Laterality Date  . Nephrectomy  1999    for CA  . Cholecystectomy  1999  . Transurethral resection of bladder tumor N/A 03/04/2013    Procedure: CYSTOSCOPY WITH RIGHT RETROGRADE PYELOGRAM AND BLADDER BIOPSY /CLOT EVACUATION/  BIOPSY PROSTATIC URETHRA WITH FULGERATION ;  Surgeon: Molli Hazard, MD;  Location: [facility]S;  Service: Urology;  Laterality: N/A;  . Cystoscopy w/ retrogrades N/A 04/08/2013    Procedure: CYSTOSCOPY WITH CLOT EVACUATION;  Surgeon: Molli Hazard, MD;  Location: [facility] OR[facility];  Service: Urology;  Laterality: N/A;  . Left shoulder repair    . Shoulder open rotator cuff repair Right 05/03/2015    Procedure: RIGHT SHOULDER ROTATOR CUFF REPAIR OPEN WITH GRAFT AND ANCHOR ;  Surgeon: Latanya Maudlin, MD;  Location: [facility];  Service: Orthopedics;  Laterality: Right;  . Av fistula placement Left 07/25/2015    Procedure: ARTERIOVENOUS (AV) FISTULA CREATION;  Surgeon: Elam Dutch, MD;  Location: [facility];  Service: Vascular;  Laterality: Left;  Axillary block with minimal sedation and supplemental local at operative site.    No Known Allergies  Prior to Admission medications   Medication Sig Start Date End Date Taking? Authorizing Provider  Amino Acids-Protein Hydrolys (FEEDING SUPPLEMENT, PRO-STAT SUGAR FREE 64,) LIQD Take 30 mLs by mouth 3 (three) times daily with meals. 07/31/15  Yes Lavina Hamman, MD  fenofibrate (TRICOR) 145 MG tablet Take 145 mg by mouth daily.  10/18/12  Yes Historical Provider, MD  ferrous sulfate 325 (65 FE) MG EC tablet take 1 tablet by mouth once daily 01/17/15  Yes Troy Sine, MD  guaiFENesin (  ROBITUSSIN) 100 MG/5ML SOLN Take 10 mLs (200 mg total) by mouth every 8 (eight) hours. 07/31/15  Yes Lavina Hamman, MD  HYDROcodone-acetaminophen (NORCO/VICODIN) 5-325 MG tablet Take 1 tablet by mouth every 8 (eight) hours as needed for moderate pain or severe pain (breakthrough pain). Patient taking differently: Take 1 tablet by mouth every 8 (eight) hours as needed for moderate pain.  07/31/15  Yes Lavina Hamman, MD  insulin aspart (NOVOLOG) 100 UNIT/ML injection CBG 121 - 150:2u, CBG 151 - 200:3u, CBG 201 - 250:5u, CBG 251 - 300:8u,CBG 301 - 350:11u, CBG 351 - 400:15u Patient  taking differently: Inject 2-15 Units into the skin 3 (three) times daily with meals. CBG 121-150 2 units, 151-200 3 units, 201-250 5 units, 251-300 8 units, 301-350 11 units, 351-400 15 units 07/31/15  Yes Lavina Hamman, MD  ipratropium-albuterol (DUONEB) 0.5-2.5 (3) MG/3ML SOLN Take 3 mLs by nebulization every 4 (four) hours as needed (shortness of breath/ wheezing).   Yes Historical Provider, MD  LORazepam (ATIVAN) 1 MG tablet Take 1 tablet (1 mg total) by mouth at bedtime. 07/31/15  Yes Lavina Hamman, MD  menthol-cetylpyridinium (CEPACOL) 3 MG lozenge Take 1 lozenge (3 mg total) by mouth as needed for sore throat. 07/31/15  Yes Lavina Hamman, MD  midodrine (PROAMATINE) 10 MG tablet Take 1 tablet (10 mg total) by mouth 3 (three) times daily before meals. Patient taking differently: Take 10 mg by mouth 3 (three) times daily. 6:30am, 11:30am, 16:30pm 07/31/15  Yes Lavina Hamman, MD  multivitamin (RENA-VIT) TABS tablet Take 1 tablet by mouth at bedtime. 07/31/15  Yes Lavina Hamman, MD  Nutritional Supplements (NUTRITIONAL SUPPLEMENT PO) Take 120 mLs by mouth every evening. Med Plus 2.0   Yes Historical Provider, MD  ondansetron (ZOFRAN) 4 MG tablet Take 4 mg by mouth every 6 (six) hours as needed for nausea or vomiting.   Yes Historical Provider, MD  pantoprazole (PROTONIX) 40 MG tablet Take 1 tablet (40 mg total) by mouth 2 (two) times daily before a meal. After 08/12/15 take once a day 07/31/15 08/11/15 Yes Lavina Hamman, MD  polyvinyl alcohol (LIQUIFILM TEARS) 1.4 % ophthalmic solution Place 1 drop into both eyes 2 (two) times daily as needed for dry eyes.   Yes Historical Provider, MD  PRESCRIPTION MEDICATION Inhale into the lungs at bedtime. CPAP   Yes Historical Provider, MD  Amino Acids-Protein Hydrolys (FEEDING SUPPLEMENT, PRO-STAT SUGAR FREE 64,) LIQD Take 30 mLs by mouth 2 (two) times daily. 08/22/15   Reyne Dumas, MD  HYDROcodone-acetaminophen (NORCO/VICODIN) 5-325 MG tablet Take 1 tablet by  mouth every 8 (eight) hours as needed for moderate pain or severe pain. 08/22/15   Reyne Dumas, MD  Maltodextrin-Xanthan Gum (RESOURCE THICKENUP CLEAR) POWD Take 1 g by mouth as needed. 07/31/15   Lavina Hamman, MD  metoCLOPramide (REGLAN) 5 MG tablet Take 1 tablet (5 mg total) by mouth 3 (three) times daily before meals. 08/22/15   Reyne Dumas, MD  Nutritional Supplements (FEEDING SUPPLEMENT, NEPRO CARB STEADY,) LIQD Take 237 mLs by mouth daily at 3 pm. 07/31/15   Lavina Hamman, MD  Nutritional Supplements (FEEDING SUPPLEMENT, NEPRO CARB STEADY,) LIQD Take 237 mLs by mouth 2 (two) times daily between meals. 08/22/15   Reyne Dumas, MD  polyethylene glycol (MIRALAX / GLYCOLAX) packet Take 17 g by mouth daily. 08/22/15   Reyne Dumas, MD  senna-docusate (SENOKOT-S) 8.6-50 MG tablet Take 1 tablet by mouth 2 (two) times daily.  08/22/15   Reyne Dumas, MD  warfarin (COUMADIN) 5 MG tablet Take 1 tablet (5 mg total) by mouth one time only at 6 PM. Patient taking differently: Take 5 mg by mouth every evening.  08/01/15   Lavina Hamman, MD    Social History   Social History  . Marital Status: Married    Spouse Name: N/A  . Number of Children: N/A  . Years of Education: N/A   Occupational History  . Not on file.   Social History Main Topics  . Smoking status: Never Smoker   . Smokeless tobacco: Never Used  . Alcohol Use: 0.0 oz/week    0 Standard drinks or equivalent per week     Comment: occasional beer or wine  . Drug Use: No  . Sexual Activity: Not on file   Other Topics Concern  . Not on file   Social History Narrative     Family History  Problem Relation Age of Onset  . Cancer Mother   . Diabetes Father   . Cancer Father       Physical Examination  Filed Vitals:   08/22/15 1130 08/22/15 1147  BP: 77/49 109/65  Pulse: 98 105  Temp:  98 F (36.7 C)  Resp:     Body mass index is 32.36 kg/(m^2).  2+ left radial pulse; no thrill within fistula   CBC    Component  Value Date/Time   WBC 7.3 08/21/2015 0409   RBC 2.91* 08/21/2015 0409   RBC 2.67* 04/04/2013 0013   HGB 8.0* 08/21/2015 0409   HCT 26.1* 08/21/2015 0409   PLT 273 08/21/2015 0409   MCV 89.7 08/21/2015 0409   MCH 27.5 08/21/2015 0409   MCHC 30.7 08/21/2015 0409   RDW 17.7* 08/21/2015 0409   LYMPHSABS 0.8 08/12/2015 0400   MONOABS 0.4 08/12/2015 0400   EOSABS 0.0 08/12/2015 0400   BASOSABS 0.0 08/12/2015 0400    BMET    Component Value Date/Time   NA 133* 08/22/2015 0333   K 4.6 08/22/2015 0333   CL 99* 08/22/2015 0333   CO2 25 08/22/2015 0333   GLUCOSE 106* 08/22/2015 0333   BUN 61* 08/22/2015 0333   CREATININE 3.99* 08/22/2015 0333   CALCIUM 8.0* 08/22/2015 0333   GFRNONAA 14* 08/22/2015 0333   GFRAA 16* 08/22/2015 0333    COAGS: Lab Results  Component Value Date   INR 2.40* 08/22/2015   INR 2.24* 08/21/2015   INR 2.83* 08/20/2015     Non-Invasive Vascular Imaging:   None this admission    ASSESSMENT/PLAN: This is a 75 y.o. male with ESRD and newly placed fistula ~ a month ago that is now clotted.  Next access will be for left arm graft.  THe pt is on coumadin and is being discharged today.  He is dialyzing via a tunneled dialysis catheter.   Our office will coordinate with the pt for permanent access plan.  Most likely discharging to [place] [place] [facility] [phone]  I have examined the patient, reviewed and agree with above.Occluded left upper arm fistula placed by Dr. Oneida Alar on 07/25/2015. Scott's options with patient. Reviewed vein map. Does have small veins in the right arm as well. Recommend left arm prosthetic graft. We'll coordinate this as an outpatient. Is on chronic Coumadin therapy and will need to be off this prior to surgery        Curt Jews, MD 08/22/2015 12:40 PM

## 2015-08-31 NOTE — Final Progress Note (Signed)
Order received from Dr Glennon Mac to use diatek cath for IV,labs. Poor blood return. Flushedwith 10cc. NS.

## 2015-08-31 NOTE — Anesthesia Preprocedure Evaluation (Deleted)
Anesthesia Evaluation  Patient identified by MRN, date of birth, ID band Patient awake    Reviewed: Allergy & Precautions, NPO status , Patient's Chart, lab work & pertinent test results  Airway Mallampati: II  TM Distance: >3 FB Neck ROM: Full    Dental  (+) Poor Dentition, Dental Advisory Given   Pulmonary shortness of breath, sleep apnea and Continuous Positive Airway Pressure Ventilation , COPD,  COPD inhaler,  Intubated twice in past few months for ARF/CHF: residual hoarseness and trouble swallowing   breath sounds clear to auscultation       Cardiovascular (-) angina+ Peripheral Vascular Disease, +CHF (CHF hospitalization 1-2 weeks ago) and + DVT   Rhythm:Regular Rate:Normal  3/17 Stress: Normal LVF, Normal perfusion, no ischemia 06/28/15 ECHO: EF 65-70%, valves OK   Neuro/Psych    GI/Hepatic Neg liver ROS, GERD  Medicated and Controlled,  Endo/Other  diabetes (glu 111), Insulin DependentMorbid obesity  Renal/GU Dialysis and ESRFRenal disease (K+ 3.3)     Musculoskeletal   Abdominal (+) + obese,   Peds  Hematology Coumadin: off x 2d   Anesthesia Other Findings   Reproductive/Obstetrics                           Anesthesia Physical Anesthesia Plan  ASA: III  Anesthesia Plan: MAC   Post-op Pain Management:    Induction:   Airway Management Planned: Natural Airway and Simple Face Mask  Additional Equipment:   Intra-op Plan:   Post-operative Plan:   Informed Consent: I have reviewed the patients History and Physical, chart, labs and discussed the procedure including the risks, benefits and alternatives for the proposed anesthesia with the patient or authorized representative who has indicated his/her understanding and acceptance.   Dental advisory given and Consent reviewed with POA  Plan Discussed with: CRNA and Surgeon  Anesthesia Plan Comments: (Plan routine monitors, MAC)         Anesthesia Quick Evaluation

## 2015-09-01 ENCOUNTER — Other Ambulatory Visit: Payer: Self-pay

## 2015-09-01 LAB — BASIC METABOLIC PANEL
BUN: 46 mg/dL — AB (ref 4–21)
Creatinine: 4.1 mg/dL — AB (ref 0.6–1.3)
Glucose: 123 mg/dL
Potassium: 4.3 mmol/L (ref 3.4–5.3)
Sodium: 137 mmol/L (ref 137–147)

## 2015-09-02 ENCOUNTER — Encounter: Payer: Self-pay | Admitting: Vascular Surgery

## 2015-09-02 ENCOUNTER — Non-Acute Institutional Stay (SKILLED_NURSING_FACILITY): Payer: BLUE CROSS/BLUE SHIELD | Admitting: Internal Medicine

## 2015-09-02 ENCOUNTER — Encounter: Payer: Self-pay | Admitting: Internal Medicine

## 2015-09-02 DIAGNOSIS — F32A Depression, unspecified: Secondary | ICD-10-CM

## 2015-09-02 DIAGNOSIS — F329 Major depressive disorder, single episode, unspecified: Secondary | ICD-10-CM

## 2015-09-02 NOTE — Pre-Procedure Instructions (Signed)
:  [name]  09/02/2015     Your procedure is scheduled on Monday, Jul 31.  Report to [facility] Admitting at 7:00  A.M.                Your surgery or procedure is scheduled for 9:30 AM   Call this number if you have problems the morning of surgery:[phone]     Remember:  Do not eat food or drink liquids after midnight Sunday, July 30 .  Take these medicines the morning of surgery with A SIP OF WATER : metoCLOPramide (REGLAN), midodrine (PROAMATINE),pantoprazole (PROTONIX), fenofibrate (TRICOR).             Take if needed: HYDROcodone-acetaminophen (NORCO/VICODIN), Zofran.                 May have a nebulizer treatment.                  If your CBG is greater than 220 mg/dL, you may take  of your sliding scale (correction) dose of insulin.  The day of surgery, do not take other diabetes injectables, including Byetta (exenatide), Bydureon (exenatide ER), Victoza (liraglutide), or Trulicity (dulaglutide).  How to Manage Your Diabetes Before and After Surgery  How do I manage my blood sugar before surgery? . Check your blood sugar at least 4 times a day, starting 2 days before surgery, to make sure that the level is not too high or low. o Check your blood sugar the morning of your surgery when you wake up and every 2 hours until you get to the Short Stay unit. . If your blood sugar is less than 70 mg/dL, you will need to treat for low blood sugar: o Do not take insulin. o Treat a low blood sugar (less than 70 mg/dL) with  cup of clear juice (cranberry or apple), 4 glucose tablets, OR glucose gel. o Recheck blood sugar in 15 minutes after treatment (to make sure it is greater than 70 mg/dL). If your blood sugar is not greater than 70 mg/dL on recheck, call [phone] for further instructions. . Report your blood sugar to the short stay nurse when you get to Short Stay.     ++++++    Please send the current Medication Recordd with last doses of  medications charted. +++++++   Do not wear jewelry, make-up or nail polish.  Do not wear lotions, powders, or perfumes.              Men may shave face and neck.  Do not bring valuables to the hospital.  [facility] is not responsible for any belongings or valuables.  Contacts, dentures or bridgework may not be worn into surgery.  Leave your suitcase in the car.  After surgery it may be brought to your room.

## 2015-09-02 NOTE — Progress Notes (Signed)
MRN: [id number] Name: [name]  Sex: male Age: 75 y.o. DOB: Dec 17, 1940  [place] #:  Facility/Room: [facility] / 114 P Level Of Care: SNF Provider: Noah Delaine. Sheppard Coil, MD Emergency Contacts: Extended Emergency Contact Information Primary Emergency Contact: Choate,[name] Address: [address]          [place], [place] [address] Johnnette Litter of [place] Phone: [phone] Relation: Spouse  Code Status: DNR  Allergies: No known allergies  Chief Complaint  Patient presents with  . Acute Visit    HPI: Patient is 75 y.o. male who the DON asked me to see. Pt's wife had requested an anti depressant for him which is reasonable. Pt was due for a graft repair on 7/26,that has been changed to [facility] 7/31.  Past Medical History:  Diagnosis Date  . Chronic kidney disease, stage 3    baseline creatinine ~1.4  . Confusion    in am at times, wife thinks it is because he doesn't wear CPAP as ordered.  . Diabetes mellitus ([place])    diet controlled  . DVT (deep venous thrombosis) ([place])   . Dysrhythmia    A fib  . History of hiatal hernia   . History of stress test 06/27/2009   Low risk and EF of approximately 50%  . HLD (hyperlipidemia)   . HTN (hypertension)   . Hx of echocardiogram 12/04/2010   Normal EF >55% no significant valve disease  . Obesity   . Phlebitis    Lower extermity  . Prostate cancer ([place]) 07/2009  . Pulmonary emboli ([place]) 2008   submassive, saddle  . Sleep apnea    on CPAP    Past Surgical History:  Procedure Laterality Date  . AV FISTULA PLACEMENT Left 07/25/2015   Procedure: ARTERIOVENOUS (AV) FISTULA CREATION;  Surgeon: Elam Dutch, MD;  Location: [place];  Service: Vascular;  Laterality: Left;  Axillary block with minimal sedation and supplemental local at operative site.  . CHOLECYSTECTOMY  1999  . CYSTOSCOPY W/ RETROGRADES N/A 04/08/2013   Procedure: CYSTOSCOPY WITH CLOT EVACUATION;  Surgeon: Molli Hazard, MD;  Location: [facility];  Service:  Urology;  Laterality: N/A;  . Left shoulder repair    . NEPHRECTOMY  1999   for CA  . SHOULDER OPEN ROTATOR CUFF REPAIR Right 05/03/2015   Procedure: RIGHT SHOULDER ROTATOR CUFF REPAIR OPEN WITH GRAFT AND ANCHOR ;  Surgeon: Latanya Maudlin, MD;  Location: [facility];  Service: Orthopedics;  Laterality: Right;  . TRANSURETHRAL RESECTION OF BLADDER TUMOR N/A 03/04/2013   Procedure: CYSTOSCOPY WITH RIGHT RETROGRADE PYELOGRAM AND BLADDER BIOPSY /CLOT EVACUATION/ BIOPSY PROSTATIC URETHRA WITH FULGERATION ;  Surgeon: Molli Hazard, MD;  Location: [facility];  Service: Urology;  Laterality: N/A;      Medication List       Accurate as of 09/02/15  9:29 PM. Always use your most recent med list.          enoxaparin 100 MG/ML injection Commonly known as:  LOVENOX Inject 100 mg into the skin every 12 (twelve) hours. STOP DATE: 08/30/15   feeding supplement (NEPRO CARB STEADY) Liqd Take 237 mLs by mouth 2 (two) times daily between meals.   feeding supplement (PRO-STAT SUGAR FREE 64) Liqd Take 30 mLs by mouth 2 (two) times daily.   fenofibrate 145 MG tablet Commonly known as:  TRICOR Take 145 mg by mouth daily.   ferrous sulfate 325 (65 FE) MG EC tablet take 1 tablet by mouth once daily  guaiFENesin 100 MG/5ML Soln Commonly known as:  ROBITUSSIN Take 10 mLs (200 mg total) by mouth every 8 (eight) hours.   HYDROcodone-acetaminophen 5-325 MG tablet Commonly known as:  NORCO/VICODIN Take 1 tablet by mouth every 8 hours as needed.  Take 1 tablet by mouth before therapy in tlhe morning (not on dialysis days) Take 1 tablet by mouth with lunch after dialysis (on dialysis days). Take 2 tablets by mouth at bedtime (2100)   insulin aspart 100 UNIT/ML injection Commonly known as:  novoLOG CBG 121 - 150:2u, CBG 151 - 200:3u, CBG 201 - 250:5u, CBG 251 - 300:8u,CBG 301 - 350:11u, CBG 351 - 400:15u   ipratropium-albuterol 0.5-2.5 (3) MG/3ML Soln Commonly known as:  DUONEB Take 3 mLs by nebulization  every 4 (four) hours as needed (shortness of breath/ wheezing).   menthol-cetylpyridinium 3 MG lozenge Commonly known as:  CEPACOL Take 1 lozenge by mouth every 2 (two) hours.   metoCLOPramide 5 MG tablet Commonly known as:  REGLAN Take 1 tablet (5 mg total) by mouth 3 (three) times daily before meals.   midodrine 10 MG tablet Commonly known as:  PROAMATINE Take 1 tablet (10 mg total) by mouth 3 (three) times daily before meals.   multivitamin Tabs tablet Take 1 tablet by mouth at bedtime.   ondansetron 4 MG tablet Commonly known as:  ZOFRAN Take 4 mg by mouth every 6 (six) hours as needed for nausea or vomiting.   pantoprazole 40 MG tablet Commonly known as:  PROTONIX Take 40 mg by mouth 2 (two) times daily before a meal.   polyethylene glycol packet Commonly known as:  MIRALAX / GLYCOLAX Take 17 g by mouth daily.   polyvinyl alcohol 1.4 % ophthalmic solution Commonly known as:  LIQUIFILM TEARS Place 1 drop into both eyes 2 (two) times daily as needed for dry eyes.   senna-docusate 8.6-50 MG tablet Commonly known as:  Senokot-S Take 1 tablet by mouth 2 (two) times daily.       Meds ordered this encounter  Medications  . HYDROcodone-acetaminophen (NORCO/VICODIN) 5-325 MG tablet    Sig: Take 1 tablet by mouth every 8 hours as needed.  Take 1 tablet by mouth before therapy in tlhe morning (not on dialysis days) Take 1 tablet by mouth with lunch after dialysis (on dialysis days). Take 2 tablets by mouth at bedtime (2100)  . menthol-cetylpyridinium (CEPACOL) 3 MG lozenge    Sig: Take 1 lozenge by mouth every 2 (two) hours.     There is no immunization history on file for this patient.  Social History  Substance Use Topics  . Smoking status: Never Smoker  . Smokeless tobacco: Never Used  . Alcohol use 0.0 oz/week     Comment: occasional beer or wine    Review of Systems  DATA OBTAINED: from patient, nurse, medical record, family member GENERAL:  no fevers,  fatigue, appetite changes SKIN: No itching, rash HEENT: No complaint RESPIRATORY: No cough, wheezing, SOB CARDIAC: No chest pain, palpitations, lower extremity edema  GI: No abdominal pain, No N/V/D or constipation, No heartburn or reflux  GU: No dysuria, frequency or urgency, or incontinence  MUSCULOSKELETAL: No unrelieved bone/joint pain NEUROLOGIC: No headache, dizziness  PSYCHIATRIC: + sadness  Vitals:   09/02/15 1432  BP: 97/66  Pulse: (!) 108  Resp: 18  Temp: 97.2 F (36.2 C)    Physical Exam  GENERAL APPEARANCE: sleepy, mod conversant, No acute distress  SKIN: No diaphoresis rash HEENT: Unremarkable RESPIRATORY: Breathing is  even, unlabored. Lung sounds are clear   CARDIOVASCULAR: Heart RRR no murmurs, rubs or gallops. No peripheral edema  GASTROINTESTINAL: Abdomen is soft, non-tender, not distended w/ normal bowel sounds.  GENITOURINARY: Bladder non tender, not distended  MUSCULOSKELETAL: No abnormal joints or musculature NEUROLOGIC: Cranial nerves 2-12 grossly intact.was sleepy, did not move PSYCHIATRIC: flat, no behavioral issues  Patient Active Problem List   Diagnosis Date Noted  . Depression 09/02/2015  . GERD (gastroesophageal reflux disease) 08/27/2015  . Diabetic gastroparesis ([place]) 08/27/2015  . Functional quadriplegia ([place]) 08/27/2015  . 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 Encounter for hospice care discussion   . Palliative care encounter   . Goals of care, counseling/discussion   . Severe sepsis ([place])   . CKD (chronic kidney disease)   . Pressure ulcer 08/08/2015  . Acute respiratory failure with hypoxia ([place])   . UTI (lower urinary tract infection)   . ESRD (end stage renal disease) ([place])   . Hypocalcemia   . Acute encephalopathy   . Chronic anemia   . Fecal occult blood test positive   . HCAP (healthcare-associated pneumonia)   . Sepsis ([place])   . Elevated lactic acid level   . Elevated troponin   . Sepsis secondary to UTI ([place]) 08/07/2015   . Hypoalbuminemia 07/27/2015  . Pneumothorax   . Traumatic pneumothorax   . OSA on CPAP   . Acute on chronic renal failure ([place])   . Hemorrhagic shock   . S/p nephrectomy   . Sepsis due to Klebsiella ([place])   . OSA (obstructive sleep apnea)   . Chronic diastolic CHF (congestive heart failure) ([place])   . Cardiac arrest ([place])   . Acute deep vein thrombosis (DVT) of distal vein of right lower extremity ([place])   . Acute renal failure (ARF) ([place])   . Acute hypoxemic respiratory failure ([place])   . Acute back pain   . Bleeding   . Chest tube in place   . Perinephric hematoma   . Right flank pain   . Acute respiratory failure ([place])   . Bacteremia due to Klebsiella pneumoniae   . Secondary cardiomyopathy ([place])   . Chronic atrial fibrillation ([place])   . Uncontrolled type 2 diabetes mellitus with complication ([place])   . Dyspnea   . Acute renal failure ([place]) 06/02/2015  . Encounter for central line placement   . Septic shock ([place]) 05/31/2015  . Renal failure   . Urinary tract infectious disease   . Arterial hypotension   . Increased anion gap metabolic acidosis   . Hyponatremia   . Rotator cuff tear 05/03/2015  . Atrial fibrillation ([place]) 11/10/2014  . Obesity (BMI 30-39.9) 11/10/2014  . Type II or unspecified type diabetes mellitus with unspecified complication, uncontrolled 04/06/2013  . Anemia in chronic kidney disease 04/04/2013  . 1St degree AV block 03/07/2013  . Acute blood loss anemia 03/06/2013  . Acute-on-chronic kidney injury, baseline CKD stage 3 03/06/2013  . Hypotension, unspecified 03/06/2013  . Bradycardia 03/06/2013  . Perineal pain 03/06/2013  . Preoperative clearance 03/03/2013  . Gross hematuria 03/02/2013  . Severe obstructive sleep apnea 12/04/2012  . Morbid obesity ([place]) 12/04/2012  . Bilateral lower extremity edema 12/04/2012  . Venous insufficiency 12/04/2012  . History of nephrectomy, unilateral 12/04/2012  . Chronic anticoagulation 12/04/2012  . History  of pulmonary embolism 12/04/2012  . Essential hypertension 12/04/2012  . Cardiac murmur 12/04/2012    CBC    Component Value Date/Time   WBC 7.3 08/21/2015 0409  RBC 2.91 (L) 08/21/2015 0409   HGB 8.5 (L) 08/31/2015 1321   HCT 25.0 (L) 08/31/2015 1321   PLT 273 08/21/2015 0409   MCV 89.7 08/21/2015 0409   LYMPHSABS 0.8 08/12/2015 0400   MONOABS 0.4 08/12/2015 0400   EOSABS 0.0 08/12/2015 0400   BASOSABS 0.0 08/12/2015 0400    CMP     Component Value Date/Time   NA 137 09/01/2015   K 4.3 09/01/2015   CL 101 08/23/2015 1440   CO2 27 08/23/2015 1440   GLUCOSE 122 (H) 08/31/2015 1321   BUN 46 (A) 09/01/2015   CREATININE 4.1 (A) 09/01/2015   CREATININE 1.92 (H) 08/23/2015 1440   CALCIUM 8.4 (L) 08/23/2015 1440   PROT 5.2 (L) 08/07/2015 1957   ALBUMIN 2.0 (L) 08/23/2015 1440   AST 29 08/07/2015 1957   ALT 20 08/07/2015 1957   ALKPHOS 115 08/07/2015 1957   BILITOT 0.8 08/07/2015 1957   GFRNONAA 33 (L) 08/23/2015 1440   GFRAA 38 (L) 08/23/2015 1440    Assessment and Plan  Depression I'm starting pt on lexapro 15 mg qHS. All the serotonin reuptake inhibitors react with coumadin  Time spent > 35 min;> 50% of time with patient was spent reviewing records, labs, tests and studies, counseling and developing plan of care  Noah Delaine. Sheppard Coil, MD

## 2015-09-02 NOTE — Assessment & Plan Note (Signed)
I'm starting pt on lexapro 15 mg qHS. All the serotonin reuptake inhibitors react with coumadin

## 2015-09-03 ENCOUNTER — Other Ambulatory Visit: Payer: Self-pay

## 2015-09-03 ENCOUNTER — Emergency Department (HOSPITAL_COMMUNITY): Payer: BLUE CROSS/BLUE SHIELD

## 2015-09-03 ENCOUNTER — Encounter (HOSPITAL_COMMUNITY): Payer: Self-pay | Admitting: *Deleted

## 2015-09-03 ENCOUNTER — Emergency Department (HOSPITAL_COMMUNITY)
Admission: EM | Admit: 2015-09-03 | Discharge: 2015-09-06 | Disposition: E | Payer: BLUE CROSS/BLUE SHIELD | Attending: Emergency Medicine | Admitting: Emergency Medicine

## 2015-09-03 DIAGNOSIS — N186 End stage renal disease: Secondary | ICD-10-CM | POA: Diagnosis not present

## 2015-09-03 DIAGNOSIS — Z7901 Long term (current) use of anticoagulants: Secondary | ICD-10-CM | POA: Diagnosis not present

## 2015-09-03 DIAGNOSIS — R0602 Shortness of breath: Secondary | ICD-10-CM | POA: Insufficient documentation

## 2015-09-03 DIAGNOSIS — E1143 Type 2 diabetes mellitus with diabetic autonomic (poly)neuropathy: Secondary | ICD-10-CM | POA: Insufficient documentation

## 2015-09-03 DIAGNOSIS — I120 Hypertensive chronic kidney disease with stage 5 chronic kidney disease or end stage renal disease: Secondary | ICD-10-CM | POA: Insufficient documentation

## 2015-09-03 DIAGNOSIS — E1122 Type 2 diabetes mellitus with diabetic chronic kidney disease: Secondary | ICD-10-CM | POA: Diagnosis not present

## 2015-09-03 DIAGNOSIS — I469 Cardiac arrest, cause unspecified: Secondary | ICD-10-CM | POA: Diagnosis not present

## 2015-09-03 DIAGNOSIS — Z794 Long term (current) use of insulin: Secondary | ICD-10-CM | POA: Insufficient documentation

## 2015-09-03 DIAGNOSIS — R092 Respiratory arrest: Secondary | ICD-10-CM | POA: Diagnosis present

## 2015-09-03 LAB — COMPREHENSIVE METABOLIC PANEL
ALK PHOS: 144 U/L — AB (ref 38–126)
ALT: 17 U/L (ref 17–63)
ANION GAP: 20 — AB (ref 5–15)
AST: 29 U/L (ref 15–41)
Albumin: 1.6 g/dL — ABNORMAL LOW (ref 3.5–5.0)
BILIRUBIN TOTAL: 0.8 mg/dL (ref 0.3–1.2)
BUN: 27 mg/dL — ABNORMAL HIGH (ref 6–20)
CALCIUM: 8 mg/dL — AB (ref 8.9–10.3)
CO2: 17 mmol/L — ABNORMAL LOW (ref 22–32)
Chloride: 99 mmol/L — ABNORMAL LOW (ref 101–111)
Creatinine, Ser: 3.41 mg/dL — ABNORMAL HIGH (ref 0.61–1.24)
GFR, EST AFRICAN AMERICAN: 19 mL/min — AB (ref >60)
GFR, EST NON AFRICAN AMERICAN: 16 mL/min — AB (ref >60)
GLUCOSE: 205 mg/dL — AB (ref 65–99)
POTASSIUM: 3.6 mmol/L (ref 3.5–5.1)
Sodium: 136 mmol/L (ref 135–145)
TOTAL PROTEIN: 5.2 g/dL — AB (ref 6.5–8.1)

## 2015-09-03 LAB — BRAIN NATRIURETIC PEPTIDE: B Natriuretic Peptide: 247.4 pg/mL — ABNORMAL HIGH (ref 0.0–100.0)

## 2015-09-03 LAB — I-STAT ARTERIAL BLOOD GAS, ED
Acid-base deficit: 8 mmol/L — ABNORMAL HIGH (ref 0.0–2.0)
BICARBONATE: 17.8 meq/L — AB (ref 20.0–24.0)
O2 Saturation: 100 %
PCO2 ART: 34.7 mmHg — AB (ref 35.0–45.0)
PO2 ART: 334 mmHg — AB (ref 80.0–100.0)
TCO2: 19 mmol/L (ref 0–100)
pH, Arterial: 7.312 — ABNORMAL LOW (ref 7.350–7.450)

## 2015-09-03 LAB — I-STAT CHEM 8, ED
BUN: 29 mg/dL — AB (ref 6–20)
CHLORIDE: 101 mmol/L (ref 101–111)
Calcium, Ion: 1.02 mmol/L — ABNORMAL LOW (ref 1.12–1.23)
Creatinine, Ser: 3.1 mg/dL — ABNORMAL HIGH (ref 0.61–1.24)
Glucose, Bld: 201 mg/dL — ABNORMAL HIGH (ref 65–99)
HEMATOCRIT: 28 % — AB (ref 39.0–52.0)
Hemoglobin: 9.5 g/dL — ABNORMAL LOW (ref 13.0–17.0)
POTASSIUM: 3.7 mmol/L (ref 3.5–5.1)
SODIUM: 138 mmol/L (ref 135–145)
TCO2: 20 mmol/L (ref 0–100)

## 2015-09-03 LAB — CBC WITH DIFFERENTIAL/PLATELET
BASOS PCT: 0 %
Basophils Absolute: 0 10*3/uL (ref 0.0–0.1)
Eosinophils Absolute: 0 10*3/uL (ref 0.0–0.7)
Eosinophils Relative: 0 %
HEMATOCRIT: 25.6 % — AB (ref 39.0–52.0)
Hemoglobin: 7.2 g/dL — ABNORMAL LOW (ref 13.0–17.0)
Lymphocytes Relative: 9 %
Lymphs Abs: 1.9 10*3/uL (ref 0.7–4.0)
MCH: 26.5 pg (ref 26.0–34.0)
MCHC: 28.1 g/dL — AB (ref 30.0–36.0)
MCV: 94.1 fL (ref 78.0–100.0)
MONO ABS: 0.7 10*3/uL (ref 0.1–1.0)
MONOS PCT: 4 %
NEUTROS ABS: 17.2 10*3/uL — AB (ref 1.7–7.7)
Neutrophils Relative %: 87 %
Platelets: 498 10*3/uL — ABNORMAL HIGH (ref 150–400)
RBC: 2.72 MIL/uL — ABNORMAL LOW (ref 4.22–5.81)
RDW: 21 % — AB (ref 11.5–15.5)
WBC: 19.8 10*3/uL — ABNORMAL HIGH (ref 4.0–10.5)

## 2015-09-03 LAB — PROTIME-INR
INR: 4.92
Prothrombin Time: 47.2 seconds — ABNORMAL HIGH (ref 11.4–15.2)

## 2015-09-03 LAB — I-STAT CG4 LACTIC ACID, ED: LACTIC ACID, VENOUS: 7.37 mmol/L — AB (ref 0.5–1.9)

## 2015-09-03 LAB — I-STAT TROPONIN, ED: TROPONIN I, POC: 0.02 ng/mL (ref 0.00–0.08)

## 2015-09-05 ENCOUNTER — Ambulatory Visit (HOSPITAL_COMMUNITY)
Admission: RE | Admit: 2015-09-05 | Payer: BLUE CROSS/BLUE SHIELD | Source: Ambulatory Visit | Admitting: Vascular Surgery

## 2015-09-05 SURGERY — INSERTION OF ARTERIOVENOUS (AV) GORE-TEX GRAFT ARM
Anesthesia: Monitor Anesthesia Care | Laterality: Left

## 2015-09-05 NOTE — Telephone Encounter (Signed)
Pt is now deceased. lmtcb X1 for [name] with [employer] to ensure that these long term disability forms were for the pt and not a spouse.

## 2015-09-06 NOTE — ED Notes (Addendum)
Time of death 67; wife at bedside

## 2015-09-06 NOTE — Telephone Encounter (Signed)
LMTCB for [employer]

## 2015-09-06 NOTE — ED Notes (Signed)
Chaplain paged  

## 2015-09-06 NOTE — ED Notes (Signed)
Multiple failed attempts at IV access; Dr.Pollina at bedside for femoral line placement

## 2015-09-06 NOTE — ED Triage Notes (Signed)
Pt to ED by [facility] c/o respiratory distress. Pt is from [employer] [facility].  Rales in upper lobes, diminished in lower lobes. EMS placed pt on Bi-pap, pt became lethargic then unresponsive, thready radials. EMS unable to obtain blood pressure. Pt is cold to the touch, dialysis graft to R chest. Pt in afib with multiple PVCs

## 2015-09-06 NOTE — ED Notes (Signed)
Pt.s son left his phone number and would like all calls directed to him  [phone], [name]

## 2015-09-06 NOTE — ED Notes (Signed)
Eye care completed after family left

## 2015-09-06 NOTE — ED Notes (Signed)
Family has been at the pt.s bedside.

## 2015-09-06 NOTE — ED Notes (Signed)
Multiple attempts at IV by RNs and MD.

## 2015-09-06 NOTE — Progress Notes (Signed)
   2015/09/21 0827  Clinical Encounter Type  Visited With Family  Visit Type Death  Referral From Nurse  Consult/Referral To Chaplain  Spiritual Encounters  Spiritual Needs Grief support  Stress Factors  Family Stress Factors Loss;Major life changes  Chaplain responded to page, patient deceased, family at bedside, provided spiritual presence, emotional/grief support, prayer and hospitality, patient and family relocated for family gathering and reflection. Chaplain will be available for further support as needed.

## 2015-09-06 NOTE — ED Provider Notes (Signed)
[facility] DEPT Provider Note   CSN: [id number] Arrival date & time: 09/12/15  [id number]  First Provider Contact:  None       History   Chief Complaint Chief Complaint  Patient presents with  . Respiratory Arrest    HPI [name] is a 75 y.o. male.  Patient transferred to the emergency department from nursing home. Patient has been very sick for the last several months, multiple hospitalizations for sepsis. Patient was found minimally responsive and short of breath by nursing home staff. EMS report that he was initially on CPAP, but became obtunded and required to be bagged by bag valve mask during transport. At arrival, patient is unresponsive. He is a DO NOT RESUSCITATE.   Level V Caveat due to acuity.      Past Medical History:  Diagnosis Date  . Chronic kidney disease, stage 3    baseline creatinine ~1.4  . Confusion    in am at times, wife thinks it is because he doesn't wear CPAP as ordered.  . Diabetes mellitus ([place])    diet controlled  . DVT (deep venous thrombosis) ([place])   . Dysrhythmia    A fib  . History of hiatal hernia   . History of stress test 06/27/2009   Low risk and EF of approximately 50%  . HLD (hyperlipidemia)   . HTN (hypertension)   . Hx of echocardiogram 12/04/2010   Normal EF >55% no significant valve disease  . Obesity   . Phlebitis    Lower extermity  . Prostate cancer ([place]) 07/2009  . Pulmonary emboli ([place]) 2008   submassive, saddle  . Sleep apnea    on CPAP    Patient Active Problem List   Diagnosis Date Noted  . Depression 09/02/2015  . GERD (gastroesophageal reflux disease) 08/27/2015  . Diabetic gastroparesis ([place]) 08/27/2015  . Functional quadriplegia ([place]) 08/27/2015  . 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 Encounter for hospice care discussion   . Palliative care encounter   . Goals of care, counseling/discussion   . Severe sepsis ([place])   . CKD (chronic kidney disease)   . Pressure ulcer 08/08/2015  . Acute  respiratory failure with hypoxia ([place])   . UTI (lower urinary tract infection)   . ESRD (end stage renal disease) ([place])   . Hypocalcemia   . Acute encephalopathy   . Chronic anemia   . Fecal occult blood test positive   . HCAP (healthcare-associated pneumonia)   . Sepsis ([place])   . Elevated lactic acid level   . Elevated troponin   . Sepsis secondary to UTI ([place]) 08/07/2015  . Hypoalbuminemia 07/27/2015  . Pneumothorax   . Traumatic pneumothorax   . OSA on CPAP   . Acute on chronic renal failure ([place])   . Hemorrhagic shock   . S/p nephrectomy   . Sepsis due to Klebsiella ([place])   . OSA (obstructive sleep apnea)   . Chronic diastolic CHF (congestive heart failure) ([place])   . Cardiac arrest ([place])   . Acute deep vein thrombosis (DVT) of distal vein of right lower extremity ([place])   . Acute renal failure (ARF) ([place])   . Acute hypoxemic respiratory failure ([place])   . Acute back pain   . Bleeding   . Chest tube in place   . Perinephric hematoma   . Right flank pain   . Acute respiratory failure ([place])   . Bacteremia due to Klebsiella pneumoniae   . Secondary cardiomyopathy ([place])   . Chronic  atrial fibrillation ([place])   . Uncontrolled type 2 diabetes mellitus with complication ([place])   . Dyspnea   . Acute renal failure ([place]) 06/02/2015  . Encounter for central line placement   . Septic shock ([place]) 05/31/2015  . Renal failure   . Urinary tract infectious disease   . Arterial hypotension   . Increased anion gap metabolic acidosis   . Hyponatremia   . Rotator cuff tear 05/03/2015  . Atrial fibrillation ([place]) 11/10/2014  . Obesity (BMI 30-39.9) 11/10/2014  . Type II or unspecified type diabetes mellitus with unspecified complication, uncontrolled 04/06/2013  . Anemia in chronic kidney disease 04/04/2013  . 1St degree AV block 03/07/2013  . Acute blood loss anemia 03/06/2013  . Acute-on-chronic kidney injury, baseline CKD stage 3 03/06/2013  . Hypotension, unspecified 03/06/2013  .  Bradycardia 03/06/2013  . Perineal pain 03/06/2013  . Preoperative clearance 03/03/2013  . Gross hematuria 03/02/2013  . Severe obstructive sleep apnea 12/04/2012  . Morbid obesity ([place]) 12/04/2012  . Bilateral lower extremity edema 12/04/2012  . Venous insufficiency 12/04/2012  . History of nephrectomy, unilateral 12/04/2012  . Chronic anticoagulation 12/04/2012  . History of pulmonary embolism 12/04/2012  . Essential hypertension 12/04/2012  . Cardiac murmur 12/04/2012    Past Surgical History:  Procedure Laterality Date  . AV FISTULA PLACEMENT Left 07/25/2015   Procedure: ARTERIOVENOUS (AV) FISTULA CREATION;  Surgeon: Elam Dutch, MD;  Location: [facility];  Service: Vascular;  Laterality: Left;  Axillary block with minimal sedation and supplemental local at operative site.  . CHOLECYSTECTOMY  1999  . CYSTOSCOPY W/ RETROGRADES N/A 04/08/2013   Procedure: CYSTOSCOPY WITH CLOT EVACUATION;  Surgeon: Molli Hazard, MD;  Location: [facility];  Service: Urology;  Laterality: N/A;  . Left shoulder repair    . NEPHRECTOMY  1999   for CA  . SHOULDER OPEN ROTATOR CUFF REPAIR Right 05/03/2015   Procedure: RIGHT SHOULDER ROTATOR CUFF REPAIR OPEN WITH GRAFT AND ANCHOR ;  Surgeon: Latanya Maudlin, MD;  Location: [facility];  Service: Orthopedics;  Laterality: Right;  . TRANSURETHRAL RESECTION OF BLADDER TUMOR N/A 03/04/2013   Procedure: CYSTOSCOPY WITH RIGHT RETROGRADE PYELOGRAM AND BLADDER BIOPSY /CLOT EVACUATION/ BIOPSY PROSTATIC URETHRA WITH FULGERATION ;  Surgeon: Molli Hazard, MD;  Location: [facility];  Service: Urology;  Laterality: N/A;       Home Medications    Prior to Admission medications   Medication Sig Start Date End Date Taking? Authorizing Provider  Amino Acids-Protein Hydrolys (FEEDING SUPPLEMENT, PRO-STAT SUGAR FREE 64,) LIQD Take 30 mLs by mouth 2 (two) times daily. 08/22/15   Reyne Dumas, MD  enoxaparin (LOVENOX) 100 MG/ML injection Inject 100 mg into the skin every 12  (twelve) hours. STOP DATE: 08/30/15    Historical Provider, MD  fenofibrate (TRICOR) 145 MG tablet Take 145 mg by mouth daily.  10/18/12   Historical Provider, MD  ferrous sulfate 325 (65 FE) MG EC tablet take 1 tablet by mouth once daily 01/17/15   Troy Sine, MD  guaiFENesin (ROBITUSSIN) 100 MG/5ML SOLN Take 10 mLs (200 mg total) by mouth every 8 (eight) hours. 07/31/15   Lavina Hamman, MD  HYDROcodone-acetaminophen (NORCO/VICODIN) 5-325 MG tablet Take 1 tablet by mouth every 8 hours as needed.  Take 1 tablet by mouth before therapy in tlhe morning (not on dialysis days) Take 1 tablet by mouth with lunch after dialysis (on dialysis days). Take 2 tablets by mouth at bedtime (2100)    Historical Provider, MD  insulin aspart (NOVOLOG) 100 UNIT/ML injection CBG 121 - 150:2u, CBG 151 - 200:3u, CBG 201 - 250:5u, CBG 251 - 300:8u,CBG 301 - 350:11u, CBG 351 - 400:15u 07/31/15   Lavina Hamman, MD  ipratropium-albuterol (DUONEB) 0.5-2.5 (3) MG/3ML SOLN Take 3 mLs by nebulization every 4 (four) hours as needed (shortness of breath/ wheezing).    Historical Provider, MD  menthol-cetylpyridinium (CEPACOL) 3 MG lozenge Take 1 lozenge by mouth every 2 (two) hours.    Historical Provider, MD  metoCLOPramide (REGLAN) 5 MG tablet Take 1 tablet (5 mg total) by mouth 3 (three) times daily before meals. 08/22/15   Reyne Dumas, MD  midodrine (PROAMATINE) 10 MG tablet Take 1 tablet (10 mg total) by mouth 3 (three) times daily before meals. 07/31/15   Lavina Hamman, MD  multivitamin (RENA-VIT) TABS tablet Take 1 tablet by mouth at bedtime. 07/31/15   Lavina Hamman, MD  Nutritional Supplements (FEEDING SUPPLEMENT, NEPRO CARB STEADY,) LIQD Take 237 mLs by mouth 2 (two) times daily between meals. 08/22/15   Reyne Dumas, MD  ondansetron (ZOFRAN) 4 MG tablet Take 4 mg by mouth every 6 (six) hours as needed for nausea or vomiting.    Historical Provider, MD  pantoprazole (PROTONIX) 40 MG tablet Take 40 mg by mouth 2 (two) times  daily before a meal.     Historical Provider, MD  polyethylene glycol (MIRALAX / GLYCOLAX) packet Take 17 g by mouth daily. 08/22/15   Reyne Dumas, MD  polyvinyl alcohol (LIQUIFILM TEARS) 1.4 % ophthalmic solution Place 1 drop into both eyes 2 (two) times daily as needed for dry eyes.    Historical Provider, MD  senna-docusate (SENOKOT-S) 8.6-50 MG tablet Take 1 tablet by mouth 2 (two) times daily. 08/22/15   Reyne Dumas, MD    Family History Family History  Problem Relation Age of Onset  . Cancer Mother   . Diabetes Father   . Cancer Father     Social History Social History  Substance Use Topics  . Smoking status: Never Smoker  . Smokeless tobacco: Never Used  . Alcohol use 0.0 oz/week     Comment: occasional beer or wine     Allergies   No known allergies   Review of Systems Review of Systems  Unable to perform ROS: Acuity of condition     Physical Exam Updated Vital Signs BP 97/68   Pulse 99   Temp (!) 96.7 F (35.9 C) (Rectal)   Resp 20   SpO2 90%   Physical Exam  HENT:  Head: Atraumatic.  Eyes:  Pupils mid, minimally reactive  Neck: Neck supple.  Cardiovascular:  Tachycardia, irregular  Pulmonary/Chest: He is in respiratory distress. He has decreased breath sounds.  Abdominal: Soft. Bowel sounds are absent.  Musculoskeletal: He exhibits edema.  Neurological: He is unresponsive.  Skin:  Cool to the touch, multiple skin tears bilateral upper extremities     ED Treatments / Results  Labs (all labs ordered are listed, but only abnormal results are displayed) Labs Reviewed  I-STAT CG4 LACTIC ACID, ED - Abnormal; Notable for the following:       Result Value   Lactic Acid, Venous 7.37 (*)    All other components within normal limits  I-STAT ARTERIAL BLOOD GAS, ED - Abnormal; Notable for the following:    pH, Arterial 7.312 (*)    pCO2 arterial 34.7 (*)    pO2, Arterial 334.0 (*)    Bicarbonate 17.8 (*)    Acid-base deficit  8.0 (*)    All other  components within normal limits  I-STAT CHEM 8, ED - Abnormal; Notable for the following:    BUN 29 (*)    Creatinine, Ser 3.10 (*)    Glucose, Bld 201 (*)    Calcium, Ion 1.02 (*)    Hemoglobin 9.5 (*)    HCT 28.0 (*)    All other components within normal limits  CULTURE, BLOOD (ROUTINE X 2)  CULTURE, BLOOD (ROUTINE X 2)  URINE CULTURE  COMPREHENSIVE METABOLIC PANEL  CBC WITH DIFFERENTIAL/PLATELET  URINALYSIS, ROUTINE W REFLEX MICROSCOPIC (NOT AT [facility])  BRAIN NATRIURETIC PEPTIDE  PROTIME-INR  I-STAT TROPOININ, ED    EKG  EKG Interpretation  Date/Time:  09/22/2015 06:02:11 EDT Ventricular Rate:  110 PR Interval:    QRS Duration: 78 QT Interval:  318 QTC Calculation: 402 R Axis:   -32 Text Interpretation:  Atrial fibrillation Paired ventricular premature complexes Inferior infarct, old Lateral leads are also involved Confirmed by Zemirah Krasinski  MD, Josiah Nieto [id number]) on September 22, 2015 8:06:01 AM       Radiology No results found.  Procedures .Central Line Date/Time: 09-22-15 8:00 AM Performed by: Orpah Greek Authorized by: Orpah Greek   Consent:    Consent obtained:  Emergent situation Anesthesia (see MAR for exact dosages):    Anesthesia method:  None Procedure details:    Location:  R femoral   Patient position:  Flat   Ultrasound guidance: yes     Number of attempts:  4   Successful placement: no   Post-procedure details:    Post-procedure:  Dressing applied Comments:     Ultrasound utilized to find femoral vein. Vein was actually identified as being lateral to the artery. I was able to cannulate the vein under visualization using ultrasound and draw labs, but was unable to pass the guidewire to place the central line. After multiple attempts, attempt was aborted   (including critical care time)  Medications Ordered in ED Medications - No data to display   Initial Impression / Assessment and Plan / ED Course  I have reviewed  the triage vital signs and the nursing notes.  Pertinent labs & imaging results that were available during my care of the patient were reviewed by me and considered in my medical decision making (see chart for details).  Clinical Course    Patient brought to the emergency department in severe respiratory distress. Patient was found in his bed this morning at nursing home unresponsive. He was experiencing agonal breathing at arrival. He was very cool to the touch. Urine in Foley bag and catheter tubing was noted to be extremely purulent. Patient has a history of sepsis, recurrent sepsis was considered likely. Patient did not have access at arrival. Initial attempts for peripheral access were unsuccessful. I did attempt to place a central line but was unsuccessful in passing the guidewire for access. I therefore began to make arrangements to access the dialysis catheter, however, patient became extremely agonal and then bradycardic. He lost pulses and was declared dead at 7:04 AM. When patient began to deteriorate, his wife was brought to the room from the waiting area and was present when he died.  Primary care doctor is Dr. Melinda Crutch. I did discuss the case presented with Dr. Drema Dallas, on call for the group. Death certificate will be forwarded to Dr. Harrington Challenger.  CRITICAL CARE Performed by: Orpah Greek   Total critical care time: 45 minutes  Critical care time  was exclusive of separately billable procedures and treating other patients.  Critical care was necessary to treat or prevent imminent or life-threatening deterioration.  Critical care was time spent personally by me on the following activities: development of treatment plan with patient and/or surrogate as well as nursing, discussions with consultants, evaluation of patient's response to treatment, examination of patient, obtaining history from patient or surrogate, ordering and performing treatments and interventions, ordering and  review of laboratory studies, ordering and review of radiographic studies, pulse oximetry and re-evaluation of patient's condition.   Final Clinical Impressions(s) / ED Diagnoses   Final diagnoses:  None  Cardiopulmonary Arrest   New Prescriptions New Prescriptions   No medications on file     Orpah Greek, MD 09-30-15 [phone]

## 2015-09-06 DEATH — deceased

## 2015-09-08 ENCOUNTER — Ambulatory Visit: Payer: BLUE CROSS/BLUE SHIELD | Admitting: Vascular Surgery

## 2015-09-08 LAB — CULTURE, BLOOD (ROUTINE X 2): Culture: NO GROWTH

## 2015-09-08 NOTE — Telephone Encounter (Signed)
Attempted to contact [name] at [facility]. The line rang several times with no answer. Will try back.

## 2015-09-09 NOTE — Telephone Encounter (Signed)
Attempted to contact [facility] again. There was no answer, line rang several times. Will try back.

## 2015-09-12 NOTE — Telephone Encounter (Signed)
We have attempted to contact [facility] several times with no response or answer. Per triage protocol, message will be closed.

## 2015-09-15 NOTE — Congregational Nurse Program (Signed)
Congregational Nurse Program Note  Date of Encounter: 08/08/2015  Past Medical History: Past Medical History:  Diagnosis Date  . Chronic kidney disease, stage 3    baseline creatinine ~1.4  . Confusion    in am at times, wife thinks it is because he doesn't wear CPAP as ordered.  . Diabetes mellitus ([place])    diet controlled  . DVT (deep venous thrombosis) ([place])   . Dysrhythmia    A fib  . History of hiatal hernia   . History of stress test 06/27/2009   Low risk and EF of approximately 50%  . HLD (hyperlipidemia)   . HTN (hypertension)   . Hx of echocardiogram 12/04/2010   Normal EF >55% no significant valve disease  . Obesity   . Phlebitis    Lower extermity  . Prostate cancer ([place]) 07/2009  . Pulmonary emboli ([place]) 2008   submassive, saddle  . Sleep apnea    on CPAP    Encounter Details:   Patient deceased

## 2015-09-30 NOTE — Telephone Encounter (Signed)
Pt is deceased - in previous telephone note from the beginning of August our office made several attempts to reach [facility] regarding deceased status. Telephone note was closed d/t unsuccessful attempts.  Nothing further needed.

## 2016-07-17 IMAGING — RF DG SWALLOWING FUNCTION - NRPT [facility]
1 series · 18 of 24 positions shown · non-contrast
Comparison: none

[Series 1: run · 11 acquisitions, 18 frames shown]
[im 1/11]
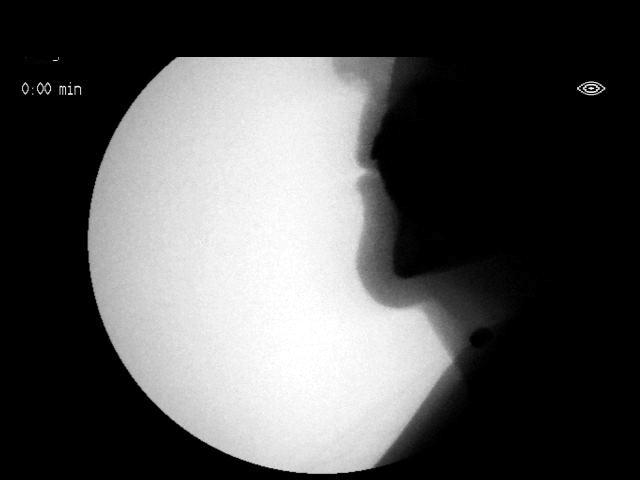
[im 2/11]
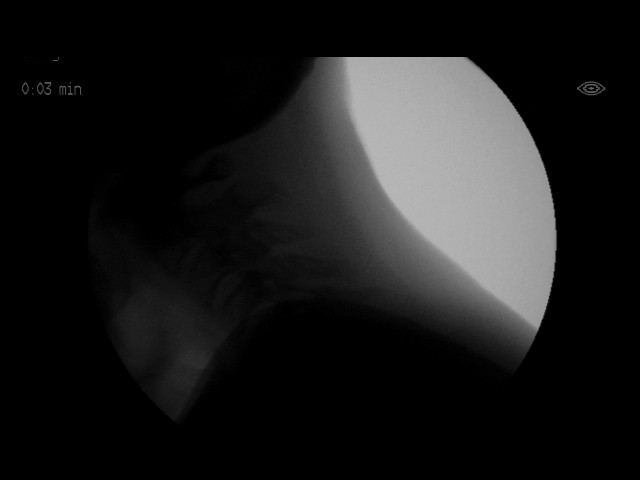
[im 2/11]
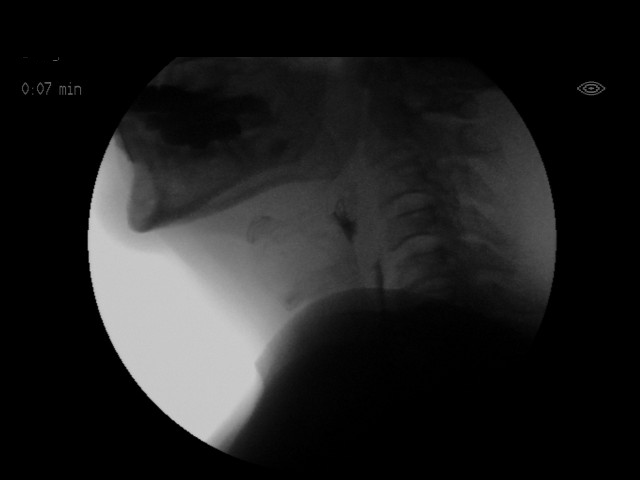
[im 2/11]
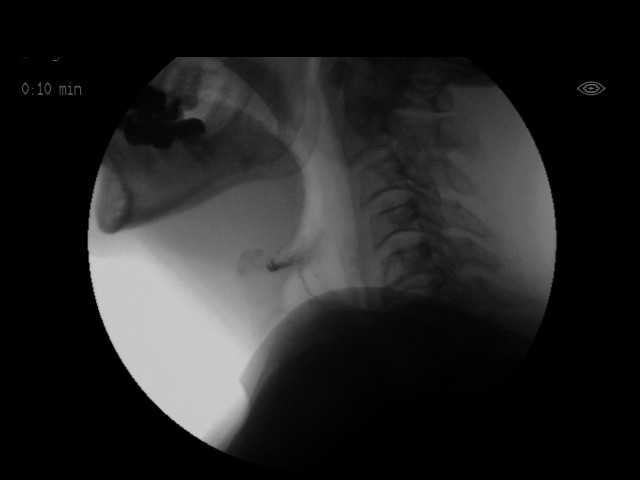
[im 3/11]
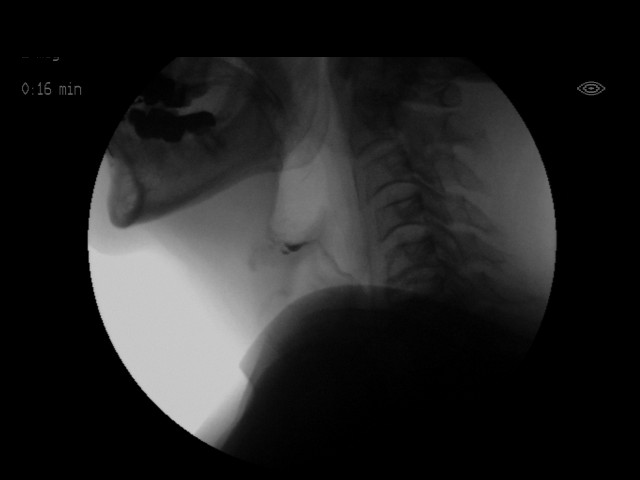
[im 4/11]
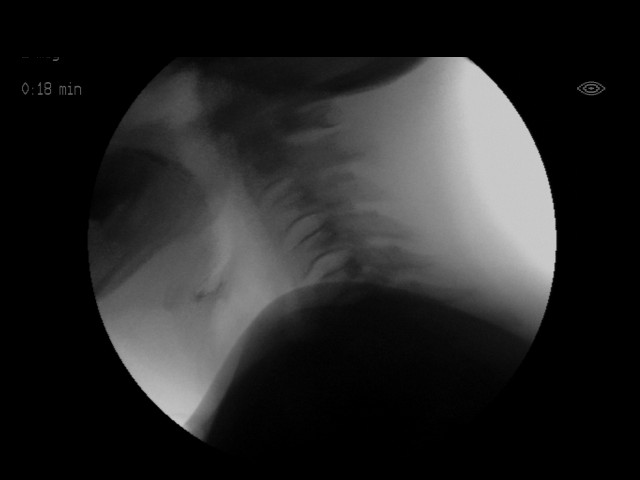
[im 4/11]
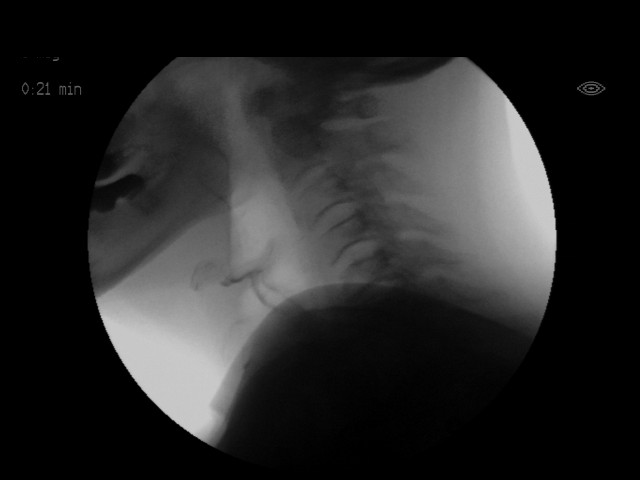
[im 5/11]
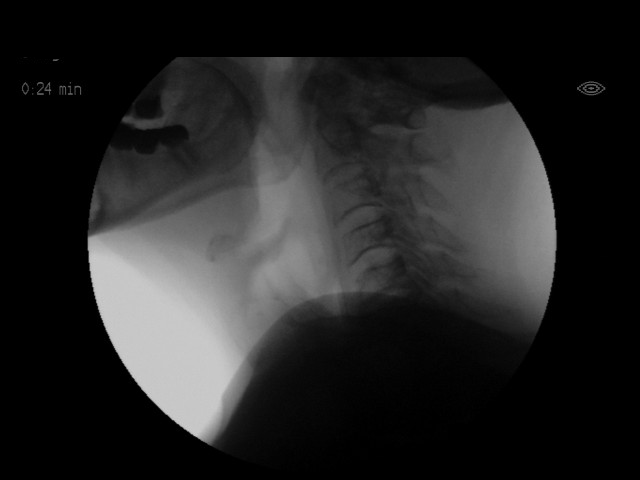
[im 6/11]
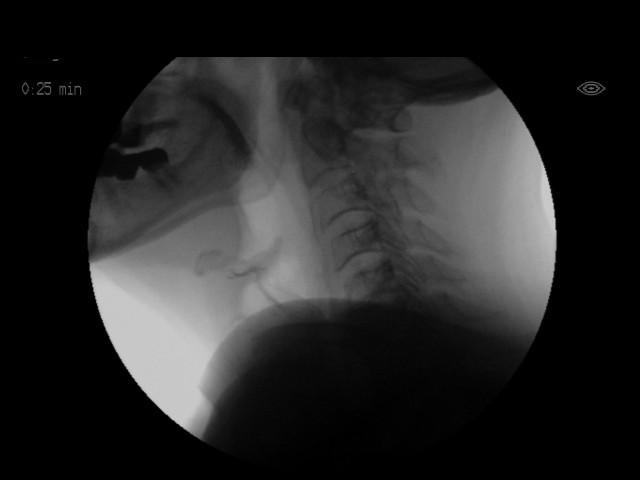
[im 6/11]
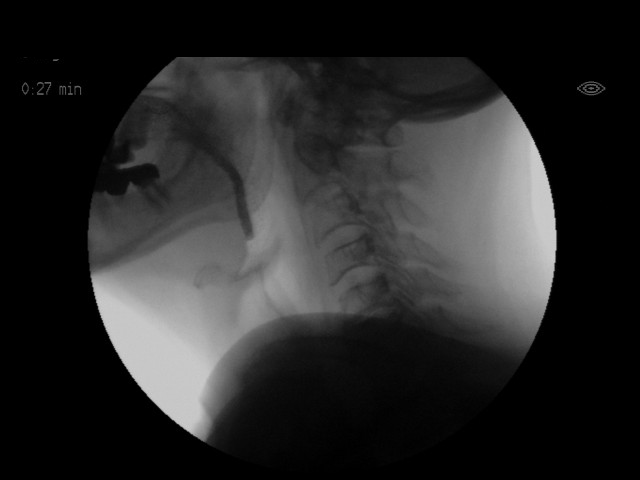
[im 7/11]
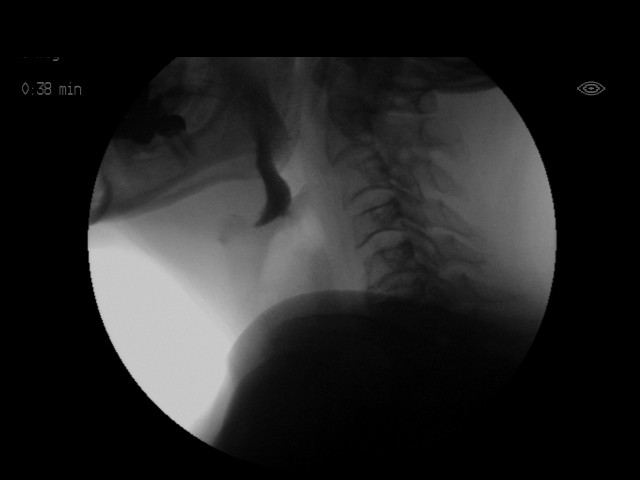
[im 8/11]
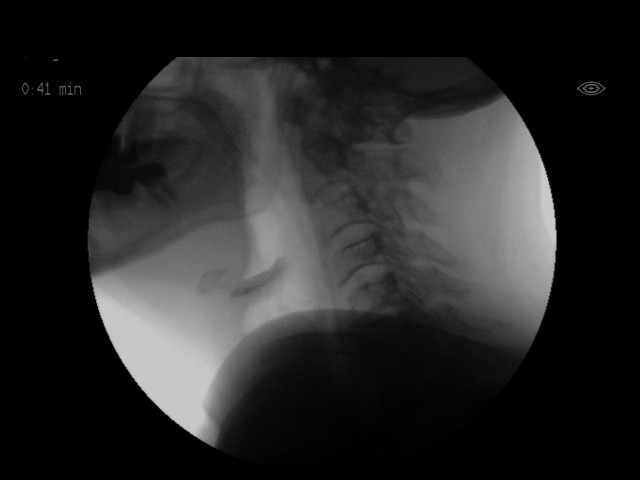
[im 8/11]
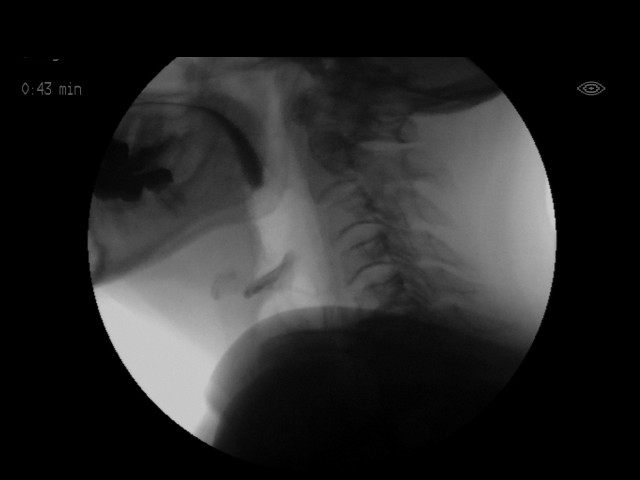
[im 9/11]
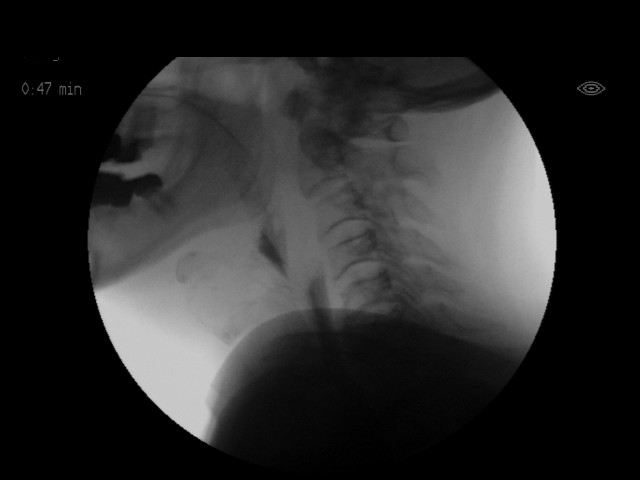
[im 10/11]
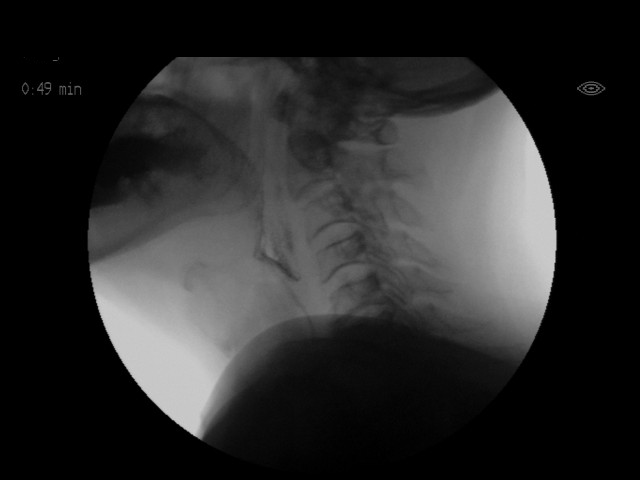
[im 10/11]
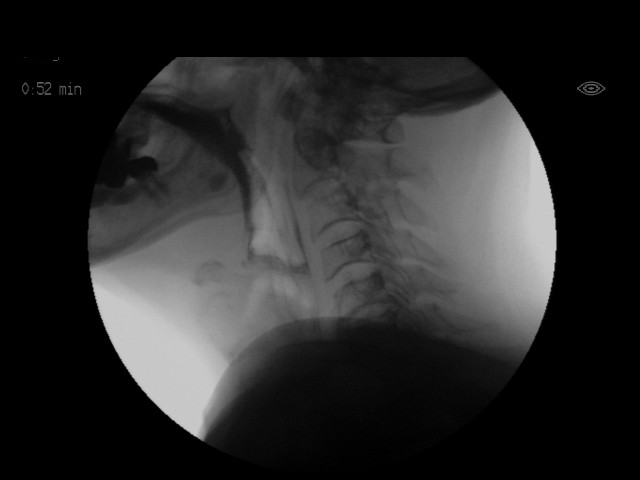
[im 11/11]
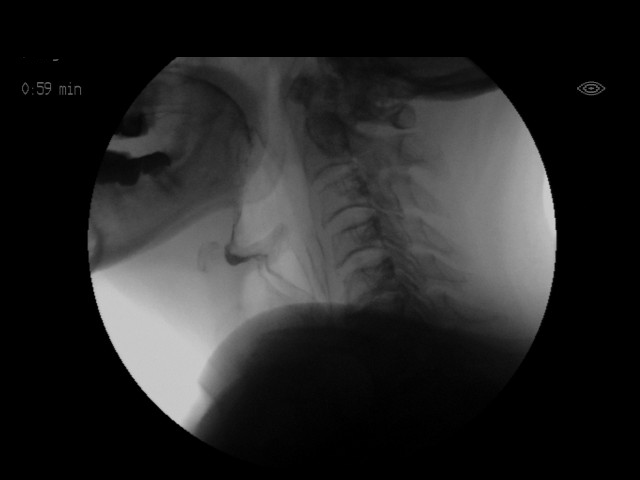
[im 11/11]
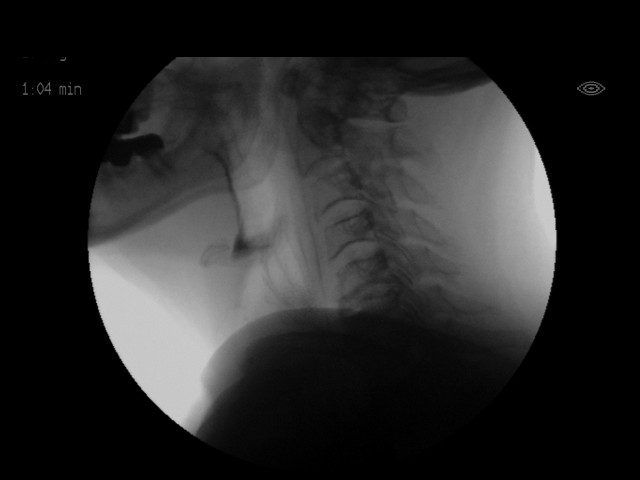

[18 of 24 positions shown; findings below may reference images not displayed]

FLUOROSCOPY FOR SWALLOWING FUNCTION STUDY:
Fluoroscopy was provided for swallowing function study, which was administered by a speech pathologist.  Final results and recommendations from this study are contained within the speech pathology report.

## 2017-05-18 IMAGING — DX DG CHEST 1V PORT
1 series · 1 of 1 positions shown · non-contrast
Comparison: Yesterday

CLINICAL DATA: Pneumothorax after CPR.

EXAM:
PORTABLE CHEST 1 VIEW

[chest ap]
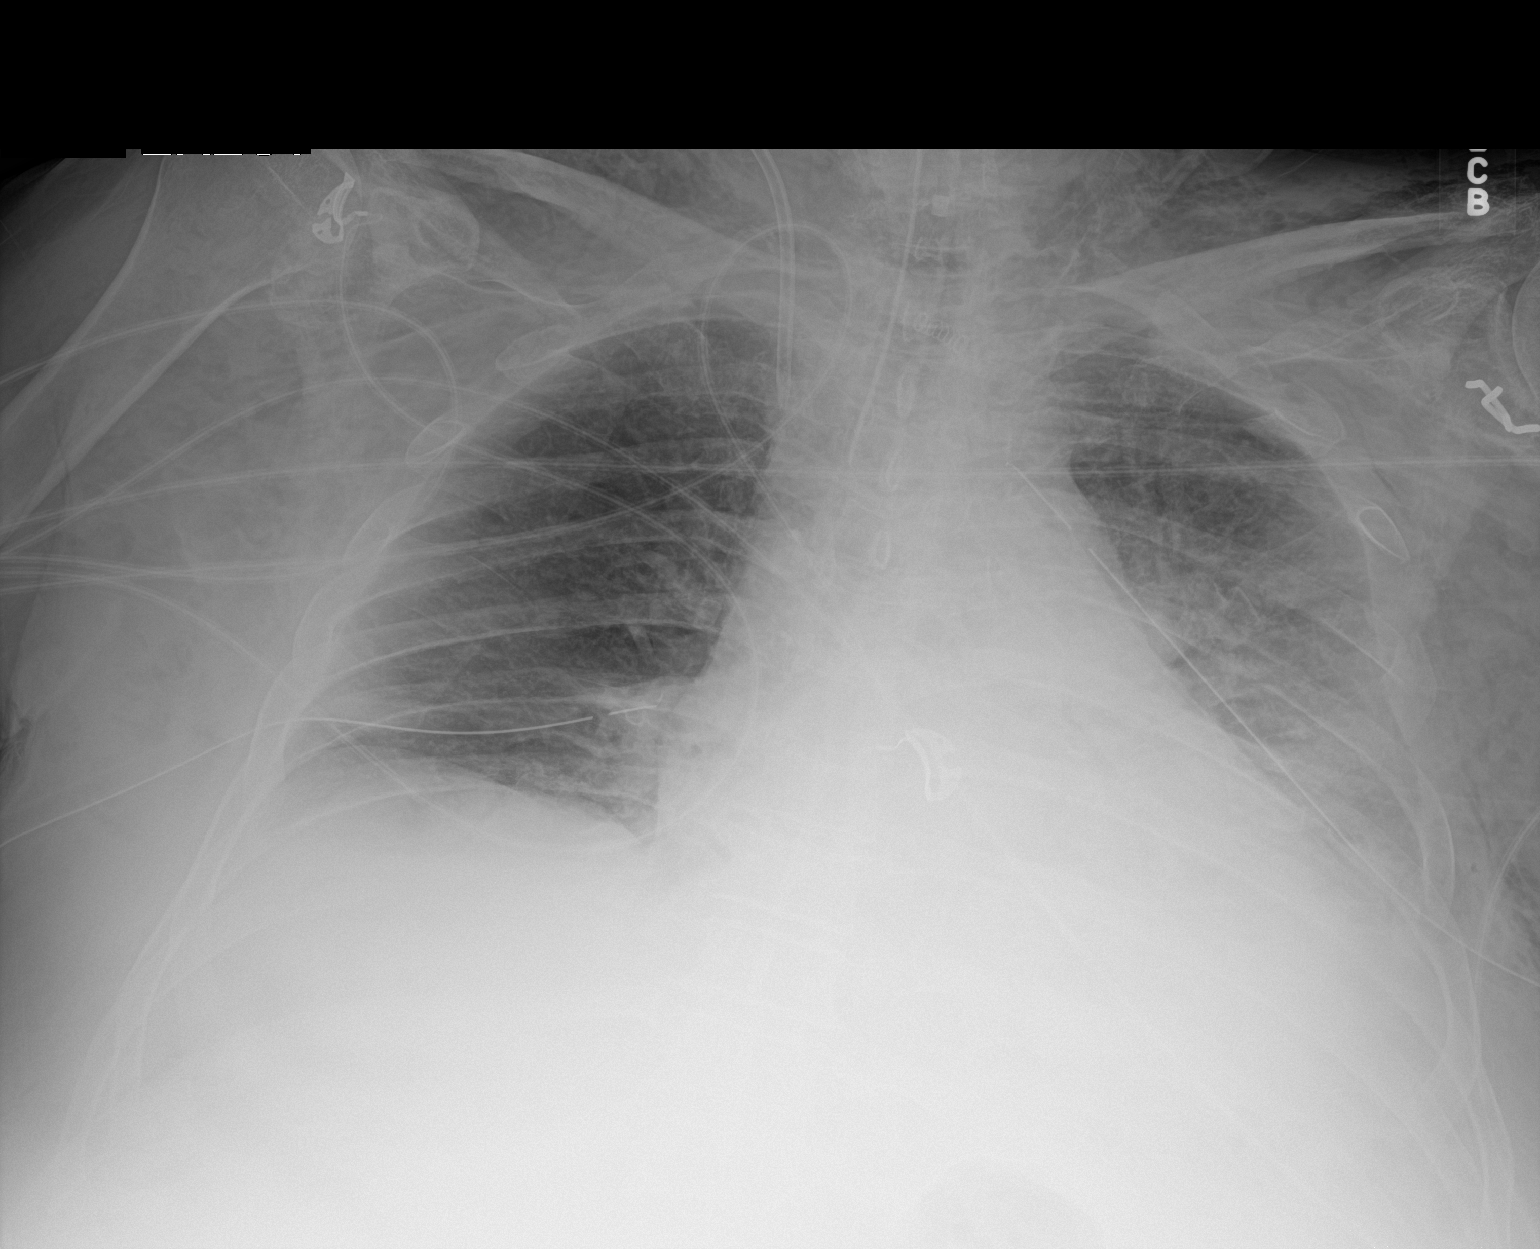

[1 of 1 positions shown; findings below may reference images not displayed]

FINDINGS: Endotracheal tube tip is between the clavicular heads and carina.
Right IJ central line with tip near the SVC origin.

Bilateral chest tubes in stable position. No convincing
pneumothorax. Left-sided rib fractures without gross increase in
displacement. Dense opacity behind the heart with air bronchograms.
Diffuse chest wall emphysema which is decreasing.
IMPRESSION: 1. Stable positioning of tubes and central line.
2. No visible pneumothorax. Extensive chest wall emphysema is
gradually decreasing.
3. Retrocardiac atelectasis or pneumonia.

## 2017-05-20 IMAGING — CR DG CHEST 1V PORT
1 series · 1 of 1 positions shown · non-contrast
Comparison: 06/12/2015

CLINICAL DATA: Pneumothorax

EXAM:
PORTABLE CHEST 1 VIEW

[AP]
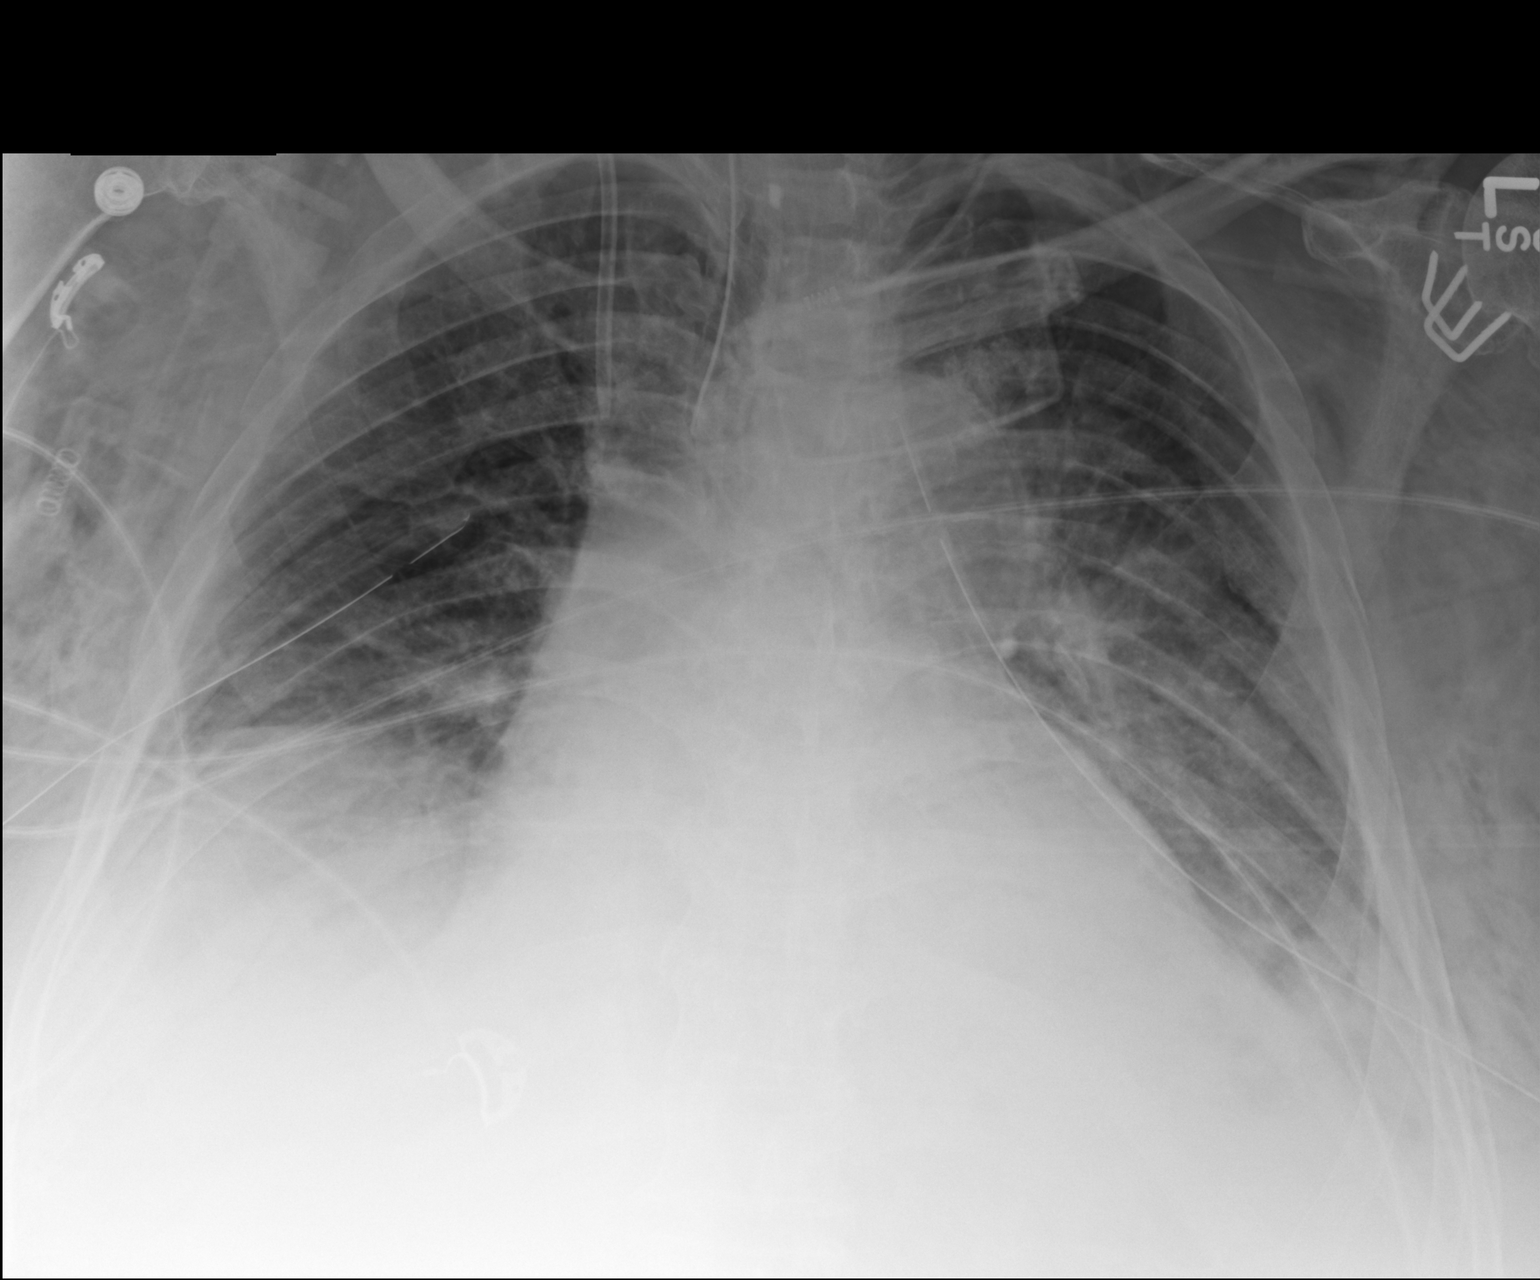

[1 of 1 positions shown; findings below may reference images not displayed]

FINDINGS: Bilateral chest tubes remain in place.  No pneumothorax.

Endotracheal tube in good position. Right jugular central venous
catheter tip in the right innominate vein unchanged.

Bibasilar atelectasis left greater than right is unchanged.

Bilateral subcutaneous emphysema appears improved.
IMPRESSION: Endotracheal tube in good position.  No pneumothorax

Bibasilar atelectasis unchanged.

## 2017-05-23 IMAGING — DX DG CHEST 1V PORT
1 series · 1 of 1 positions shown · non-contrast
Comparison: 06/14/2015

CLINICAL DATA: Respiratory failure

EXAM:
PORTABLE CHEST 1 VIEW

[chest ap]
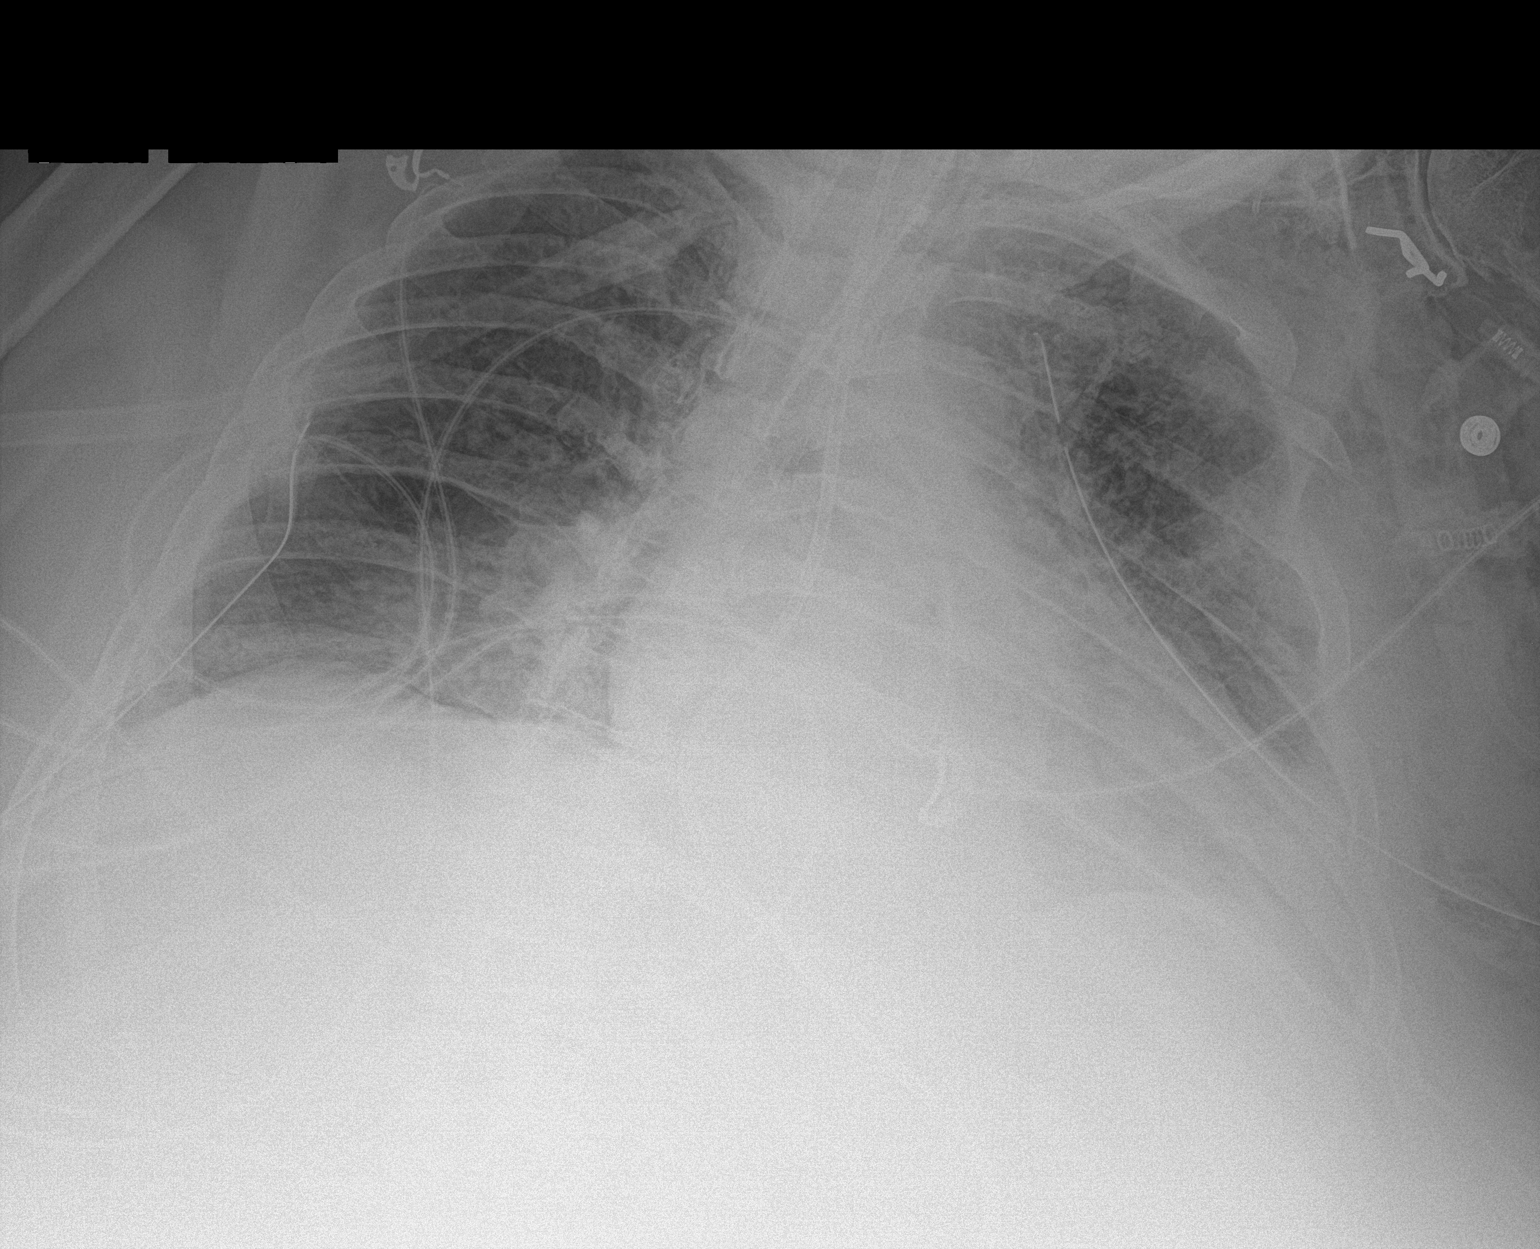

[1 of 1 positions shown; findings below may reference images not displayed]

FINDINGS: Endotracheal tube terminates 1 cm above the carina.

Increased interstitial markings. Mild patchy left basilar opacity,
likely atelectasis. Possible small left pleural effusion.

Bilateral chest tubes.  No definite pneumothorax is seen.

Right IJ venous catheter terminates in the mid SVC.

Enteric tube courses below the diaphragm.
IMPRESSION: Mild patchy left basilar opacity, likely atelectasis. Possible small
left pleural effusion.

Bilateral chest tubes.  No definite pneumothorax is seen.

Endotracheal tube terminates 1 cm above the carina. Additional
stable support apparatus as above.
